# Patient Record
Sex: Male | Born: 1935 | Race: White | Hispanic: No | Marital: Married | Smoking: Former smoker
Health system: Southern US, Community
[De-identification: ages and names within clinical notes are randomized; demographics above are authoritative.]

## PROBLEM LIST (undated history)

## (undated) ENCOUNTER — Emergency Department (HOSPITAL_COMMUNITY): Admission: EM | Payer: Medicare Other | Source: Home / Self Care

## (undated) DIAGNOSIS — I5042 Chronic combined systolic (congestive) and diastolic (congestive) heart failure: Secondary | ICD-10-CM

## (undated) DIAGNOSIS — J90 Pleural effusion, not elsewhere classified: Secondary | ICD-10-CM

## (undated) DIAGNOSIS — I6521 Occlusion and stenosis of right carotid artery: Secondary | ICD-10-CM

## (undated) DIAGNOSIS — I499 Cardiac arrhythmia, unspecified: Secondary | ICD-10-CM

## (undated) DIAGNOSIS — I5043 Acute on chronic combined systolic (congestive) and diastolic (congestive) heart failure: Secondary | ICD-10-CM

## (undated) DIAGNOSIS — Z95810 Presence of automatic (implantable) cardiac defibrillator: Secondary | ICD-10-CM

## (undated) DIAGNOSIS — I2510 Atherosclerotic heart disease of native coronary artery without angina pectoris: Secondary | ICD-10-CM

## (undated) DIAGNOSIS — I472 Ventricular tachycardia: Secondary | ICD-10-CM

## (undated) DIAGNOSIS — I4729 Other ventricular tachycardia: Secondary | ICD-10-CM

## (undated) DIAGNOSIS — Z8669 Personal history of other diseases of the nervous system and sense organs: Secondary | ICD-10-CM

## (undated) DIAGNOSIS — R519 Headache, unspecified: Secondary | ICD-10-CM

## (undated) DIAGNOSIS — I493 Ventricular premature depolarization: Secondary | ICD-10-CM

## (undated) DIAGNOSIS — Z889 Allergy status to unspecified drugs, medicaments and biological substances status: Secondary | ICD-10-CM

## (undated) DIAGNOSIS — I480 Paroxysmal atrial fibrillation: Secondary | ICD-10-CM

## (undated) DIAGNOSIS — R51 Headache: Secondary | ICD-10-CM

## (undated) HISTORY — DX: Personal history of other diseases of the nervous system and sense organs: Z86.69

## (undated) HISTORY — DX: Pleural effusion, not elsewhere classified: J90

## (undated) HISTORY — PX: CARDIAC DEFIBRILLATOR PLACEMENT: SHX171

## (undated) HISTORY — DX: Atherosclerotic heart disease of native coronary artery without angina pectoris: I25.10

## (undated) HISTORY — PX: HEMORRHOID SURGERY: SHX153

## (undated) HISTORY — PX: KNEE ARTHROSCOPY: SUR90

## (undated) HISTORY — DX: Paroxysmal atrial fibrillation: I48.0

## (undated) HISTORY — PX: MITRAL VALVE REPLACEMENT: SHX147

## (undated) HISTORY — PX: CATARACT EXTRACTION W/ INTRAOCULAR LENS  IMPLANT, BILATERAL: SHX1307

## (undated) HISTORY — DX: Presence of automatic (implantable) cardiac defibrillator: Z95.810

## (undated) HISTORY — DX: Ventricular premature depolarization: I49.3

## (undated) HISTORY — DX: Ventricular tachycardia: I47.2

## (undated) HISTORY — DX: Other ventricular tachycardia: I47.29

## (undated) HISTORY — DX: Allergy status to unspecified drugs, medicaments and biological substances: Z88.9

## (undated) HISTORY — PX: HEMORROIDECTOMY: SUR656

---

## 1998-08-19 ENCOUNTER — Ambulatory Visit (HOSPITAL_COMMUNITY): Admission: RE | Admit: 1998-08-19 | Discharge: 1998-08-19 | Payer: Self-pay | Admitting: Endocrinology

## 1998-08-19 ENCOUNTER — Encounter: Payer: Self-pay | Admitting: Endocrinology

## 1998-08-31 ENCOUNTER — Ambulatory Visit (HOSPITAL_COMMUNITY): Admission: RE | Admit: 1998-08-31 | Discharge: 1998-08-31 | Payer: Self-pay | Admitting: Endocrinology

## 1998-11-02 ENCOUNTER — Encounter: Payer: Self-pay | Admitting: *Deleted

## 1998-11-02 ENCOUNTER — Ambulatory Visit (HOSPITAL_COMMUNITY): Admission: RE | Admit: 1998-11-02 | Discharge: 1998-11-02 | Payer: Self-pay | Admitting: *Deleted

## 1999-01-31 HISTORY — PX: CORONARY ARTERY BYPASS GRAFT: SHX141

## 1999-10-25 ENCOUNTER — Ambulatory Visit (HOSPITAL_COMMUNITY): Admission: RE | Admit: 1999-10-25 | Discharge: 1999-10-25 | Payer: Self-pay | Admitting: Cardiology

## 1999-10-25 HISTORY — PX: CARDIAC CATHETERIZATION: SHX172

## 1999-10-31 ENCOUNTER — Ambulatory Visit (HOSPITAL_COMMUNITY): Admission: RE | Admit: 1999-10-31 | Discharge: 1999-10-31 | Payer: Self-pay | Admitting: Cardiology

## 2000-02-07 ENCOUNTER — Encounter: Payer: Self-pay | Admitting: Cardiology

## 2000-02-07 ENCOUNTER — Ambulatory Visit (HOSPITAL_COMMUNITY): Admission: RE | Admit: 2000-02-07 | Discharge: 2000-02-07 | Payer: Self-pay | Admitting: Cardiology

## 2000-02-14 ENCOUNTER — Encounter (HOSPITAL_COMMUNITY): Admission: RE | Admit: 2000-02-14 | Discharge: 2000-05-14 | Payer: Self-pay | Admitting: Cardiology

## 2000-02-27 ENCOUNTER — Encounter: Payer: Self-pay | Admitting: Cardiology

## 2000-02-27 ENCOUNTER — Ambulatory Visit (HOSPITAL_COMMUNITY): Admission: RE | Admit: 2000-02-27 | Discharge: 2000-02-27 | Payer: Self-pay | Admitting: Cardiology

## 2000-02-27 ENCOUNTER — Encounter: Payer: Self-pay | Admitting: Endocrinology

## 2000-03-14 ENCOUNTER — Encounter: Payer: Self-pay | Admitting: Cardiology

## 2000-03-14 ENCOUNTER — Ambulatory Visit (HOSPITAL_COMMUNITY): Admission: RE | Admit: 2000-03-14 | Discharge: 2000-03-14 | Payer: Self-pay | Admitting: Cardiology

## 2000-04-09 ENCOUNTER — Ambulatory Visit (HOSPITAL_COMMUNITY): Admission: RE | Admit: 2000-04-09 | Discharge: 2000-04-09 | Payer: Self-pay | Admitting: Cardiology

## 2000-04-09 ENCOUNTER — Encounter: Payer: Self-pay | Admitting: Endocrinology

## 2000-04-09 ENCOUNTER — Encounter: Payer: Self-pay | Admitting: Cardiology

## 2000-05-29 ENCOUNTER — Ambulatory Visit (HOSPITAL_COMMUNITY): Admission: RE | Admit: 2000-05-29 | Discharge: 2000-05-29 | Payer: Self-pay | Admitting: Cardiology

## 2000-05-29 HISTORY — PX: CARDIAC CATHETERIZATION: SHX172

## 2001-08-02 ENCOUNTER — Emergency Department (HOSPITAL_COMMUNITY): Admission: EM | Admit: 2001-08-02 | Discharge: 2001-08-02 | Payer: Self-pay | Admitting: Emergency Medicine

## 2002-04-08 ENCOUNTER — Inpatient Hospital Stay (HOSPITAL_COMMUNITY): Admission: RE | Admit: 2002-04-08 | Discharge: 2002-04-09 | Payer: Self-pay | Admitting: Internal Medicine

## 2002-04-08 ENCOUNTER — Encounter: Payer: Self-pay | Admitting: Internal Medicine

## 2002-04-09 ENCOUNTER — Encounter: Payer: Self-pay | Admitting: Internal Medicine

## 2003-01-06 ENCOUNTER — Ambulatory Visit (HOSPITAL_COMMUNITY): Admission: RE | Admit: 2003-01-06 | Discharge: 2003-01-06 | Payer: Self-pay | Admitting: Cardiology

## 2003-01-06 HISTORY — PX: CARDIAC CATHETERIZATION: SHX172

## 2003-01-16 ENCOUNTER — Encounter: Admission: RE | Admit: 2003-01-16 | Discharge: 2003-01-16 | Payer: Self-pay | Admitting: Cardiology

## 2004-02-10 ENCOUNTER — Ambulatory Visit: Payer: Self-pay | Admitting: Internal Medicine

## 2004-06-08 ENCOUNTER — Ambulatory Visit: Payer: Self-pay | Admitting: Internal Medicine

## 2004-11-15 ENCOUNTER — Ambulatory Visit: Payer: Self-pay | Admitting: Internal Medicine

## 2004-11-28 ENCOUNTER — Ambulatory Visit: Payer: Self-pay | Admitting: Internal Medicine

## 2004-12-07 ENCOUNTER — Ambulatory Visit: Payer: Self-pay | Admitting: Internal Medicine

## 2004-12-07 ENCOUNTER — Inpatient Hospital Stay (HOSPITAL_COMMUNITY): Admission: AD | Admit: 2004-12-07 | Discharge: 2004-12-09 | Payer: Self-pay | Admitting: Internal Medicine

## 2005-01-04 ENCOUNTER — Ambulatory Visit: Payer: Self-pay | Admitting: Internal Medicine

## 2005-05-09 ENCOUNTER — Ambulatory Visit: Payer: Self-pay | Admitting: Internal Medicine

## 2005-05-16 ENCOUNTER — Ambulatory Visit (HOSPITAL_COMMUNITY): Admission: RE | Admit: 2005-05-16 | Discharge: 2005-05-16 | Payer: Self-pay | Admitting: Orthopedic Surgery

## 2005-08-10 ENCOUNTER — Ambulatory Visit: Payer: Self-pay | Admitting: Internal Medicine

## 2005-11-09 ENCOUNTER — Ambulatory Visit: Payer: Self-pay | Admitting: Internal Medicine

## 2006-01-26 ENCOUNTER — Ambulatory Visit: Payer: Self-pay | Admitting: Internal Medicine

## 2006-02-06 ENCOUNTER — Ambulatory Visit (HOSPITAL_COMMUNITY): Admission: RE | Admit: 2006-02-06 | Discharge: 2006-02-06 | Payer: Self-pay | Admitting: Cardiology

## 2006-02-06 HISTORY — PX: CARDIAC CATHETERIZATION: SHX172

## 2006-04-26 ENCOUNTER — Ambulatory Visit: Payer: Self-pay | Admitting: Internal Medicine

## 2006-06-14 ENCOUNTER — Ambulatory Visit: Payer: Self-pay | Admitting: Internal Medicine

## 2006-07-19 ENCOUNTER — Ambulatory Visit: Payer: Self-pay | Admitting: Internal Medicine

## 2006-08-23 ENCOUNTER — Ambulatory Visit: Payer: Self-pay | Admitting: Internal Medicine

## 2006-09-27 ENCOUNTER — Ambulatory Visit: Payer: Self-pay | Admitting: Internal Medicine

## 2007-01-16 ENCOUNTER — Ambulatory Visit: Payer: Self-pay | Admitting: Internal Medicine

## 2007-03-05 ENCOUNTER — Ambulatory Visit: Payer: Self-pay | Admitting: Vascular Surgery

## 2007-03-18 ENCOUNTER — Ambulatory Visit: Payer: Self-pay | Admitting: Vascular Surgery

## 2007-03-18 ENCOUNTER — Ambulatory Visit (HOSPITAL_COMMUNITY): Admission: RE | Admit: 2007-03-18 | Discharge: 2007-03-18 | Payer: Self-pay | Admitting: Vascular Surgery

## 2007-04-01 ENCOUNTER — Inpatient Hospital Stay (HOSPITAL_COMMUNITY): Admission: EM | Admit: 2007-04-01 | Discharge: 2007-04-04 | Payer: Self-pay | Admitting: Emergency Medicine

## 2007-04-01 ENCOUNTER — Ambulatory Visit: Payer: Self-pay | Admitting: Cardiology

## 2007-04-02 ENCOUNTER — Encounter: Payer: Self-pay | Admitting: Internal Medicine

## 2007-04-02 HISTORY — PX: TRANSTHORACIC ECHOCARDIOGRAM: SHX275

## 2007-04-03 HISTORY — PX: CARDIAC CATHETERIZATION: SHX172

## 2007-04-09 ENCOUNTER — Ambulatory Visit: Payer: Self-pay | Admitting: Vascular Surgery

## 2007-04-24 ENCOUNTER — Ambulatory Visit: Payer: Self-pay | Admitting: Internal Medicine

## 2007-07-18 ENCOUNTER — Ambulatory Visit: Payer: Self-pay | Admitting: Internal Medicine

## 2007-10-02 ENCOUNTER — Ambulatory Visit: Payer: Self-pay | Admitting: Internal Medicine

## 2008-01-03 ENCOUNTER — Encounter: Admission: RE | Admit: 2008-01-03 | Discharge: 2008-01-03 | Payer: Self-pay | Admitting: Orthopedic Surgery

## 2008-02-18 ENCOUNTER — Ambulatory Visit: Payer: Self-pay | Admitting: Internal Medicine

## 2008-03-11 ENCOUNTER — Encounter: Payer: Self-pay | Admitting: Internal Medicine

## 2008-04-23 ENCOUNTER — Encounter: Payer: Self-pay | Admitting: Internal Medicine

## 2008-04-23 ENCOUNTER — Ambulatory Visit: Payer: Self-pay | Admitting: Internal Medicine

## 2008-04-23 DIAGNOSIS — I493 Ventricular premature depolarization: Secondary | ICD-10-CM | POA: Insufficient documentation

## 2008-04-23 DIAGNOSIS — I7300 Raynaud's syndrome without gangrene: Secondary | ICD-10-CM | POA: Insufficient documentation

## 2008-04-23 DIAGNOSIS — I4949 Other premature depolarization: Secondary | ICD-10-CM | POA: Insufficient documentation

## 2008-04-23 DIAGNOSIS — Z95810 Presence of automatic (implantable) cardiac defibrillator: Secondary | ICD-10-CM | POA: Insufficient documentation

## 2008-05-21 ENCOUNTER — Ambulatory Visit: Payer: Self-pay | Admitting: Internal Medicine

## 2008-05-22 ENCOUNTER — Ambulatory Visit: Payer: Self-pay | Admitting: Internal Medicine

## 2008-05-22 ENCOUNTER — Encounter: Payer: Self-pay | Admitting: Internal Medicine

## 2008-06-08 ENCOUNTER — Telehealth (INDEPENDENT_AMBULATORY_CARE_PROVIDER_SITE_OTHER): Payer: Self-pay | Admitting: *Deleted

## 2008-06-12 ENCOUNTER — Telehealth (INDEPENDENT_AMBULATORY_CARE_PROVIDER_SITE_OTHER): Payer: Self-pay | Admitting: *Deleted

## 2008-06-12 ENCOUNTER — Encounter: Payer: Self-pay | Admitting: Internal Medicine

## 2008-06-15 ENCOUNTER — Ambulatory Visit: Payer: Self-pay | Admitting: Internal Medicine

## 2008-06-15 DIAGNOSIS — I4891 Unspecified atrial fibrillation: Secondary | ICD-10-CM | POA: Insufficient documentation

## 2008-06-15 DIAGNOSIS — I4819 Other persistent atrial fibrillation: Secondary | ICD-10-CM | POA: Insufficient documentation

## 2008-06-16 ENCOUNTER — Telehealth: Payer: Self-pay | Admitting: Internal Medicine

## 2008-06-16 LAB — CONVERTED CEMR LAB
BUN: 14 mg/dL (ref 6–23)
Basophils Absolute: 0 10*3/uL (ref 0.0–0.1)
Basophils Relative: 0.7 % (ref 0.0–3.0)
CO2: 28 meq/L (ref 19–32)
Calcium: 9.2 mg/dL (ref 8.4–10.5)
Chloride: 105 meq/L (ref 96–112)
Creatinine, Ser: 0.8 mg/dL (ref 0.4–1.5)
Eosinophils Absolute: 0.3 10*3/uL (ref 0.0–0.7)
Eosinophils Relative: 7.7 % — ABNORMAL HIGH (ref 0.0–5.0)
GFR calc non Af Amer: 100.24 mL/min (ref >60)
Glucose, Bld: 112 mg/dL — ABNORMAL HIGH (ref 70–99)
HCT: 42.2 % (ref 39.0–52.0)
Hemoglobin: 14.6 g/dL (ref 13.0–17.0)
INR: 1 (ref 0.8–1.0)
Lymphocytes Relative: 15.8 % (ref 12.0–46.0)
Lymphs Abs: 0.6 10*3/uL — ABNORMAL LOW (ref 0.7–4.0)
MCHC: 34.5 g/dL (ref 30.0–36.0)
MCV: 99.7 fL (ref 78.0–100.0)
Magnesium: 2.4 mg/dL (ref 1.5–2.5)
Monocytes Absolute: 0.2 10*3/uL (ref 0.1–1.0)
Monocytes Relative: 6.9 % (ref 3.0–12.0)
Neutro Abs: 2.4 10*3/uL (ref 1.4–7.7)
Neutrophils Relative %: 68.9 % (ref 43.0–77.0)
Platelets: 131 10*3/uL — ABNORMAL LOW (ref 150.0–400.0)
Potassium: 4.8 meq/L (ref 3.5–5.1)
Prothrombin Time: 11.1 s (ref 10.9–13.3)
RBC: 4.23 M/uL (ref 4.22–5.81)
RDW: 12.5 % (ref 11.5–14.6)
Sodium: 139 meq/L (ref 135–145)
WBC: 3.5 10*3/uL — ABNORMAL LOW (ref 4.5–10.5)
aPTT: 29.9 s — ABNORMAL HIGH (ref 21.7–28.8)

## 2008-06-17 ENCOUNTER — Ambulatory Visit: Payer: Self-pay | Admitting: Internal Medicine

## 2008-06-17 ENCOUNTER — Inpatient Hospital Stay (HOSPITAL_COMMUNITY): Admission: RE | Admit: 2008-06-17 | Discharge: 2008-06-20 | Payer: Self-pay | Admitting: Internal Medicine

## 2008-06-18 ENCOUNTER — Encounter: Payer: Self-pay | Admitting: Internal Medicine

## 2008-06-22 ENCOUNTER — Telehealth: Payer: Self-pay | Admitting: Internal Medicine

## 2008-06-22 ENCOUNTER — Encounter: Payer: Self-pay | Admitting: Internal Medicine

## 2008-06-26 ENCOUNTER — Telehealth: Payer: Self-pay | Admitting: Internal Medicine

## 2008-07-02 ENCOUNTER — Telehealth (INDEPENDENT_AMBULATORY_CARE_PROVIDER_SITE_OTHER): Payer: Self-pay | Admitting: *Deleted

## 2008-07-02 ENCOUNTER — Encounter: Payer: Self-pay | Admitting: Internal Medicine

## 2008-07-02 ENCOUNTER — Ambulatory Visit: Payer: Self-pay

## 2008-09-29 ENCOUNTER — Ambulatory Visit: Payer: Self-pay | Admitting: Internal Medicine

## 2008-10-23 ENCOUNTER — Telehealth (INDEPENDENT_AMBULATORY_CARE_PROVIDER_SITE_OTHER): Payer: Self-pay | Admitting: *Deleted

## 2008-12-21 ENCOUNTER — Ambulatory Visit: Payer: Self-pay | Admitting: Internal Medicine

## 2008-12-25 ENCOUNTER — Telehealth: Payer: Self-pay | Admitting: Internal Medicine

## 2009-01-11 ENCOUNTER — Encounter: Payer: Self-pay | Admitting: Internal Medicine

## 2009-01-12 ENCOUNTER — Ambulatory Visit: Payer: Self-pay | Admitting: Internal Medicine

## 2009-01-12 DIAGNOSIS — R233 Spontaneous ecchymoses: Secondary | ICD-10-CM | POA: Insufficient documentation

## 2009-01-13 LAB — CONVERTED CEMR LAB
Basophils Absolute: 0 10*3/uL (ref 0.0–0.1)
Basophils Relative: 0.8 % (ref 0.0–3.0)
Eosinophils Absolute: 0.2 10*3/uL (ref 0.0–0.7)
Eosinophils Relative: 4.1 % (ref 0.0–5.0)
HCT: 41 % (ref 39.0–52.0)
Hemoglobin: 14.3 g/dL (ref 13.0–17.0)
Lymphocytes Relative: 19.3 % (ref 12.0–46.0)
Lymphs Abs: 0.9 10*3/uL (ref 0.7–4.0)
MCHC: 34.8 g/dL (ref 30.0–36.0)
MCV: 100.9 fL — ABNORMAL HIGH (ref 78.0–100.0)
Monocytes Absolute: 0.5 10*3/uL (ref 0.1–1.0)
Monocytes Relative: 11.8 % (ref 3.0–12.0)
Neutro Abs: 3 10*3/uL (ref 1.4–7.7)
Neutrophils Relative %: 64 % (ref 43.0–77.0)
Platelets: 117 10*3/uL — ABNORMAL LOW (ref 150.0–400.0)
RBC: 4.06 M/uL — ABNORMAL LOW (ref 4.22–5.81)
RDW: 12.7 % (ref 11.5–14.6)
WBC: 4.6 10*3/uL (ref 4.5–10.5)

## 2009-01-15 ENCOUNTER — Telehealth: Payer: Self-pay | Admitting: Internal Medicine

## 2009-01-28 ENCOUNTER — Encounter: Payer: Self-pay | Admitting: Internal Medicine

## 2009-02-05 ENCOUNTER — Telehealth (INDEPENDENT_AMBULATORY_CARE_PROVIDER_SITE_OTHER): Payer: Self-pay | Admitting: *Deleted

## 2009-02-23 ENCOUNTER — Encounter: Payer: Self-pay | Admitting: Internal Medicine

## 2009-03-03 ENCOUNTER — Encounter: Payer: Self-pay | Admitting: Internal Medicine

## 2009-03-10 ENCOUNTER — Telehealth (INDEPENDENT_AMBULATORY_CARE_PROVIDER_SITE_OTHER): Payer: Self-pay | Admitting: *Deleted

## 2009-03-18 ENCOUNTER — Encounter: Payer: Self-pay | Admitting: Internal Medicine

## 2009-03-24 ENCOUNTER — Encounter: Payer: Self-pay | Admitting: Internal Medicine

## 2009-04-03 ENCOUNTER — Encounter: Payer: Self-pay | Admitting: Internal Medicine

## 2009-04-05 ENCOUNTER — Ambulatory Visit: Payer: Self-pay | Admitting: Internal Medicine

## 2009-04-13 ENCOUNTER — Encounter: Payer: Self-pay | Admitting: Internal Medicine

## 2009-06-25 ENCOUNTER — Telehealth: Payer: Self-pay | Admitting: Internal Medicine

## 2009-07-05 ENCOUNTER — Ambulatory Visit: Payer: Self-pay | Admitting: Internal Medicine

## 2009-07-19 ENCOUNTER — Ambulatory Visit: Payer: Self-pay | Admitting: Internal Medicine

## 2009-07-19 ENCOUNTER — Telehealth: Payer: Self-pay | Admitting: Internal Medicine

## 2009-07-22 ENCOUNTER — Telehealth: Payer: Self-pay | Admitting: Internal Medicine

## 2009-09-20 ENCOUNTER — Telehealth (INDEPENDENT_AMBULATORY_CARE_PROVIDER_SITE_OTHER): Payer: Self-pay | Admitting: *Deleted

## 2009-09-28 ENCOUNTER — Ambulatory Visit: Payer: Self-pay | Admitting: Internal Medicine

## 2009-12-13 ENCOUNTER — Telehealth (INDEPENDENT_AMBULATORY_CARE_PROVIDER_SITE_OTHER): Payer: Self-pay | Admitting: *Deleted

## 2009-12-28 ENCOUNTER — Ambulatory Visit: Payer: Self-pay | Admitting: Internal Medicine

## 2010-02-27 LAB — CONVERTED CEMR LAB
BUN: 15 mg/dL (ref 6–23)
BUN: 16 mg/dL (ref 6–23)
CO2: 30 meq/L (ref 19–32)
CO2: 31 meq/L (ref 19–32)
Calcium: 9.2 mg/dL (ref 8.4–10.5)
Calcium: 9.3 mg/dL (ref 8.4–10.5)
Chloride: 101 meq/L (ref 96–112)
Chloride: 105 meq/L (ref 96–112)
Creatinine, Ser: 0.9 mg/dL (ref 0.4–1.5)
Creatinine, Ser: 1.1 mg/dL (ref 0.4–1.5)
GFR calc non Af Amer: 69.13 mL/min (ref >60)
GFR calc non Af Amer: 85.06 mL/min (ref >60)
Glucose, Bld: 101 mg/dL — ABNORMAL HIGH (ref 70–99)
Glucose, Bld: 158 mg/dL — ABNORMAL HIGH (ref 70–99)
Magnesium: 2.3 mg/dL (ref 1.5–2.5)
Magnesium: 2.3 mg/dL (ref 1.5–2.5)
Potassium: 4.5 meq/L (ref 3.5–5.1)
Potassium: 4.6 meq/L (ref 3.5–5.1)
Sodium: 139 meq/L (ref 135–145)
Sodium: 140 meq/L (ref 135–145)

## 2010-03-03 NOTE — Cardiovascular Report (Signed)
Summary: Office Visit   Office Visit   Imported By: Roderic Ovens 01/03/2010 16:17:08  _____________________________________________________________________  External Attachment:    Type:   Image     Comment:   External Document

## 2010-03-03 NOTE — Progress Notes (Signed)
Summary: ICD shocks  Phone Note Call from Patient Call back at Home Phone [phone]   Caller: Patient [phone] Reason for Call: Talk to Nurse Summary of Call: Spoke with patient.  Recieved 4 shocks Friday night for VT/VF, pt only aware of 1 shock.  No new symptoms of cp or SOB, no antecedent symtpoms to shock.  Appt today at 1:45 with Dr Graciela Husbands.  Pt advised not to drive. Gypsy Balsam RN BSN  July 19, 2009 9:52 AM

## 2010-03-03 NOTE — Progress Notes (Signed)
  Phone Note Other Incoming   Caller: [facility] Summary of Call: S/W lady from [facility] who says they have not gotten a refill authorization for this pt's [name]. I see where it has been sent 2 times already. I s/w her because this is happening more and more lately that they are claiming NOT to receive our faxes. I went over the fax numbers that we have in our system for them and she confirmed it as being their main number. I told her that we are not having this problem with any other pharmacies. She was appreciative for the information and will s/w her supervisor. RX given to pharmacist.  Initial call taken by: Duncan Dull, RN, BSN,  February 05, 2009 10:32 AM Call placed by: Duncan Dull, RN, BSN,  February 05, 2009 10:24 AM

## 2010-03-03 NOTE — Progress Notes (Signed)
  Phone Note Outgoing Call   Call placed by: [name] CMA,  December 13, 2009 2:20 PM Call placed to: Patient Action Taken: Phone Call Completed Summary of Call: Pt called today to ask that we reorder his [name] for pickup when he comes in for appt next week.  I placed a call to [employer] today at 2:20 and ordered [name] .5mg  two times a day for a 3 month supply to be delivered to our office.  ORDER NUMBER#  [id number].  Expecting in 7-10 business days and will hold up front for upcoming appt.   Initial call taken by: [name] CMA,  December 13, 2009 2:22 PM     Appended Document:  pt made aware tikosyn here at the front desk for pick up

## 2010-03-03 NOTE — Letter (Signed)
Summary: Confirmation of Meds Shipped  Confirmation of Meds Shipped   Imported By: Marylou Mccoy 05/14/2009 14:44:37  _____________________________________________________________________  External Attachment:    Type:   Image     Comment:   External Document

## 2010-03-03 NOTE — Letter (Signed)
Summary: [facility]   Imported By: Kassie Mends 04/12/2009 13:09:49  _____________________________________________________________________  External Attachment:    Type:   Image     Comment:   External Document

## 2010-03-03 NOTE — Progress Notes (Signed)
Summary: [facility] Papers for [name]  Phone Note Outgoing Call   Call placed by: Duncan Dull, RN, BSN,  March 10, 2009 9:08 AM Call placed to: Patient Summary of Call: S/W [name] to let him know that we have filled out his [name] papers for [employer] and I am placing them in the mail today. Initial call taken by: Duncan Dull, RN, BSN,  March 10, 2009 9:09 AM

## 2010-03-03 NOTE — Progress Notes (Signed)
Summary: refill/ need medication ordered  Phone Note Refill Request Message from:  Patient on September 20, 2009 11:33 AM  Refills Requested: Medication #1:  [name] 500 MCG CAPS one by mouth two times a day Send to [facility] [phone]  Initial call taken by: Judie Grieve,  September 20, 2009 11:34 AM  Follow-up for Phone Call        Pt ordered placed for [name].  [id number]//order number// Due to be shipped in 7-10 days.   Follow-up by: [name] CMA,  September 23, 2009 4:52 PM

## 2010-03-03 NOTE — Progress Notes (Signed)
Summary: Heart Medication History   Heart Medication History   Imported By: Roderic Ovens 10/28/2009 11:17:39  _____________________________________________________________________  External Attachment:    Type:   Image     Comment:   External Document

## 2010-03-03 NOTE — Cardiovascular Report (Signed)
Summary: Office Visit Remote   Office Visit Remote   Imported By: Roderic Ovens 03/08/2009 16:39:47  _____________________________________________________________________  External Attachment:    Type:   Image     Comment:   External Document

## 2010-03-03 NOTE — Letter (Signed)
Summary: Remote Device Check  [facility] [employer], Main Office  [address]   [place], [place] [address]   Phone: [phone]  Fax: [phone]     April 13, 2009 MRN: [id number]   [facility] [address] [place], [place]  [address]   Dear Mr. [facility],   Your remote transmission was recieved and reviewed by your physician.  All diagnostics were within normal limits for you.  ___X__Your next transmission is scheduled for:     July 05, 2009.  Please transmit at any time this day.  If you have a wireless device your transmission will be sent automatically.     Sincerely,  Proofreader

## 2010-03-03 NOTE — Assessment & Plan Note (Signed)
Summary: defib check.sjm.amber 4 shocks fri night   History of Present Illness: [name] is seen in followup for polymorphic ventricular tachycardia for which he has a ICD.  He has hx of CABG, PFO closure and mitral valve repair at [facility].  He has had appropriate intercurrent therapy of VTPM;  This was in March 2009. At that time he underwent catheterization demonstrating moderate native disease with a reasonable revascularrization.  he has done well intercurrent lady from an arrhythmia point of view apart from the development of atrial fibrillation until Friday. He was standing in the kitchen putting stakes into a bag when he had the premonition of syncope. He then picked himself up off the floor and when outside. Interrogation of his device demonstrated recurrent shocks for a slow polymorphic ventricular tachycardia with a cycle length very between 363-240 ms. Final termination was quite dirty.  He denies intercurrent change in his functional status. The patient denies SOB, chest pain, edema or palpitations      Current Medications (verified): 1)  Actos 45 Mg Tabs (Pioglitazone Hcl) .Marland Kitchen.. 1 By Mouth Daily 2)  Allegra 60 Mg Tabs (Fexofenadine Hcl) .... Once Daily 3)  Lipitor 80 Mg Tabs (Atorvastatin Calcium) .Marland Kitchen.. 1 Tab Once Daily 4)  Aspirin 81 Mg Tbec (Aspirin) .... Take One Tablet By Mouth Daily 5)  Multivitamins   Tabs (Multiple Vitamin) .... Once Daily 6)  Starlix 120 Mg Tabs (Nateglinide) .Marland Kitchen.. 1 Tab Two Times A Day 7)  Carvedilol 3.125 Mg Tabs (Carvedilol) .... Take One Tablet By Mouth Twice A Day 8)  [name] 500 Mcg Caps (Dofetilide) .... One By Mouth Two Times A Day 9)  Januvia 100 Mg Tabs (Sitagliptin Phosphate) .... One By Mouth Daily 10)  Magnesium Oxide 250 Mg Tabs (Magnesium Oxide) .... Take 1 Tablet By Mouth Once A Day 11)  Ocuvite-Lutein  Caps (Multiple Vitamins-Minerals) .... Take 1 Capsule By Mouth Once A Day  Allergies (verified): 1)  ! Coumadin  Vital  Signs:  Patient profile:   77 year old male Height:      74 inches Weight:      221 pounds BMI:     28.48 Pulse rate:   81 / minute Pulse rhythm:   irregular BP sitting:   98 / 62  (left arm)  Vitals Entered By: Gypsy Balsam RN BSN (July 19, 2009 2:09 PM)  Physical Exam  General:  The patient was alert and oriented in no acute distress. HEENT Normal.  Neck veins were flat, carotids were brisk.  Lungs were clear.  Heart sounds were Irregular without murmurs or gallops Abdomen was soft with active bowel sounds. There is no clubbing cyanosis or edema. Skin Warm and dry     ICD Specifications Following MD:  Sherryl Manges, MD     Referring MD:  [facility] ICD Vendor:  [employer] Jude     ICD Model Number:  [id number]     ICD Serial Number:  [id number] ICD DOI:  06/17/2008     ICD Implanting MD:  Sherryl Manges, MD  Lead 1:    Location: RA     DOI: 04/08/2002     Model #: [id number]     Serial #: [id number]     Status: active Lead 2:    Location: RV     DOI: 04/08/2002     Model #: [id number]     Serial #: [id number]     Status: active  Indications::  POLYMORPHIC VT, SYNCOPE  Explantation Comments: 06/17/2008 Prizm  [id number] explanted  ICD Follow Up Remote Check?  No Battery Voltage:  3.13 V     Charge Time:  10.5 seconds     Battery Est. Longevity:  5.1 YEARS Underlying rhythm:  SR WITH BIGEMINAL PVC'S ICD Dependent:  No       ICD Device Measurements Atrium:  Amplitude: 5.0 mV, Impedance: 430 ohms, Threshold: 0.75 V at 0.5 msec Right Ventricle:  Amplitude: 11.8 mV, Impedance: 510 ohms, Threshold: 0.5 V at 0.5 msec Shock Impedance: 44 ohms   Episodes MS Episodes:  123     Percent Mode Switch:  2.3%     Coumadin:  No Shock:  4     ATP:  0     Nonsustained:  0     Atrial Pacing:  76%     Ventricular Pacing:  34%  Brady Parameters Mode DDDR     Lower Rate Limit:  65     Upper Rate Limit 105 PAV 225     Sensed AV Delay:  200  Tachy Zones VF:  240     VT:  214     VT1:  171     Tech Comments:  Pt seen  as add on today for shocks and syncope Friday night.  Pt was putting away leftover steaks, got shocked, and passed out.  Pt recieved 4 total shocks for VT that degenerated into VF.  Total episode duration, a little over 2 minutes.  Pt has not had any illness recently, no med changes, no exertion day of shocks.  Plan per SK. Gypsy Balsam RN BSN  July 19, 2009 2:12 PM   Dr Graciela Husbands reprogrammed device from VT monitor zone at 160 to VT zone with therapy at 171. 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 This is the first episode since 2009. We discussed treatment options including changing his antiarrhythmic therapy to amiodarone as well as catheter ablation versus doing nothing with his [name]. We will plan to do the latter. I will change his carvedilol to metoprolol succinate as it has  t more beta blockade for its blood pressure lowering effects which are limiting up titration of his carvedilol.  We will check his potassium and magnesium today.  I will plan to send his strips to  Dr. Dory Peru at the [facility] clinic for her review  he is advised not to drive His updated medication list for this problem includes:    Aspirin 81 Mg Tbec (Aspirin) .Marland Kitchen... Take one tablet by mouth daily    Metoprolol Succinate 25 Mg Xr24h-tab (Metoprolol succinate) .Marland Kitchen... Take one tablet by mouth daily    [name] 500 Mcg Caps (Dofetilide) ..... One by mouth two times a day  Orders: TLB-BMP (Basic Metabolic Panel-BMET) (80048-METABOL) TLB-Magnesium (Mg) (83735-MG)  Problem # 2:  ATRIAL FIBRILLATION (ICD-427.31) we will need to deal with the issue of oral anticoagulation; however, until we can control his ventricular arrhythmias adding it at this point I think is too great a risk His updated medication list for  this problem includes:    Aspirin 81 Mg Tbec (Aspirin) .Marland Kitchen... Take one tablet by mouth daily    Metoprolol Succinate 25 Mg Xr24h-tab (Metoprolol succinate) .Marland Kitchen... Take one tablet by mouth daily    [name] 500 Mcg Caps (Dofetilide) ..... 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 In the absence of symptoms suggestive of progressive angina I don't think it is essentially worth  with an isolated episode of VT, repeating his catheterization His updated medication list for this problem includes:    Aspirin 81 Mg Tbec (Aspirin) .Marland Kitchen... Take one tablet by mouth daily    Metoprolol Succinate 25 Mg Xr24h-tab (Metoprolol succinate) .Marland Kitchen... Take one tablet by mouth daily    [name] 500 Mcg Caps (Dofetilide) ..... One by mouth two times a day  Patient Instructions: 1)  Your physician recommends that you schedule a follow-up appointment in: 2months with Dr Graciela Husbands. 2)  Your physician recommends that you return for lab work today. 3)  Your physician has recommended you make the following change in your medication: Stop Carvedilol, Start Toprol XL 25mg  1 tablet daily. Prescriptions: METOPROLOL SUCCINATE 25 MG XR24H-TAB (METOPROLOL SUCCINATE) Take one tablet by mouth daily  #30 x 11   Entered by:   Optometrist BSN   Authorized by:   Nathen May, MD, [facility]   Signed by:   Gypsy Balsam RN BSN on 07/19/2009   Method used:   [facility] to        [facility]       [address] [place], [place]  [address]       Ph: [phone] or [phone]       Fax: [phone]   RxID:   [id number]

## 2010-03-03 NOTE — Assessment & Plan Note (Signed)
Summary: defib check.sjm.amber   History of Present Illness: [name] is seen in followup for polymorphic ventricular tachycardia for which he has a ICD.  He has hx of CABG, PFO closure and mitral valve repair at [facility].  He has had appropriate intercurrent therapy of VTPM;  This was in March 2009. At that time he underwent catheterization demonstrating moderate native disease with a reasonable revascularrization.  he has done well intercurrently from an arrhythmia point of view apart from the development of atrial fibrillation  in June, he had an episode of recurrent ventricular tachycardia with a slow polymorphic VT. This was appropriately treated. It was associated with syncope.  At that time we switched his carvedilol to metoprolol succinate with the hope that they greater dose of beta blocker would help suppress ventricular ectopy as a surrogate for ventricular tachycardia. He felt no improvement and he went back on carvedilol.   He denies intercurrent change in his functional status. The patient denies SOB, chest pain, edema or palpitations      Current Medications (verified): 1)  Actos 45 Mg Tabs (Pioglitazone Hcl) .Marland Kitchen.. 1 By Mouth Daily 2)  Allegra 60 Mg Tabs (Fexofenadine Hcl) .... Once Daily 3)  Lipitor 80 Mg Tabs (Atorvastatin Calcium) .Marland Kitchen.. 1 Tab Once Daily 4)  Aspirin 81 Mg Tbec (Aspirin) .... Take One Tablet By Mouth Daily 5)  Multivitamins   Tabs (Multiple Vitamin) .... Once Daily 6)  Starlix 120 Mg Tabs (Nateglinide) .Marland Kitchen.. 1 Tab Two Times A Day 7)  [name] 500 Mcg Caps (Dofetilide) .... One By Mouth Two Times A Day 8)  Januvia 100 Mg Tabs (Sitagliptin Phosphate) .... One By Mouth Daily 9)  Magnesium Oxide 250 Mg Tabs (Magnesium Oxide) .... Take 1 Tablet By Mouth Once A Day 10)  Ocuvite-Lutein  Caps (Multiple Vitamins-Minerals) .... Take 1 Capsule By Mouth Once A Day 11)  Coreg 3.125 Mg Tabs (Carvedilol) .... Take One Tablet Two Times A Day  Allergies  (verified): 1)  ! 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 Coronary artery bypass grafting.   2. AICD placement.   3. Hemorrhoidectomy.   4. Right arthroscopic knee repair.   Family History: Last updated: 05/21/08  Father died of emphysema at age 52.  Mother lived up  into her 35s and had history of stroke and arthritis.  Social History: Last updated: 05/21/08 Married  Alcohol Use - yes Drug Use - no Tobacco Use - No.   Vital Signs:  Patient profile:   77 year old male Height:      74 inches Weight:      219 pounds BMI:     28.22 Pulse rhythm:   regular BP sitting:   112 / 74  (right arm) Cuff size:   regular  Vitals Entered By: [name] CMA (September 28, 2009 11:42 AM)  Physical Exam  General:  The patient was alert and oriented in no acute distress. HEENT Normal.  Neck veins were flat, carotids were brisk.  Lungs were clear.  Heart sounds were regular without murmurs or gallops.  Abdomen was soft with active bowel sounds. There is no clubbing cyanosis or edema. Skin Warm and dry    EKG  Procedure date:  09/28/2009  Findings:  sinus rhythm at 66 intervals 0.25/.one one 6.46 Axis is -60 There is intermittent atrial pacing Frequent ventricular ectopy with a left bundle inferior axis morphology   ICD Specifications Following MD:  Sherryl Manges, MD     Referring MD:  [facility] ICD Vendor:  St Jude     ICD Model Number:  [id number]     ICD Serial Number:  [id number] ICD DOI:  06/17/2008     ICD Implanting MD:  Sherryl Manges, MD  Lead 1:    Location: RA     DOI: 04/08/2002     Model #: 6295     Serial #: [id number]     Status: active Lead 2:    Location: RV      DOI: 04/08/2002     Model #: 1027     Serial #: [id number]     Status: active  Indications::  POLYMORPHIC VT, SYNCOPE  Explantation Comments: 06/17/2008 Prizm [id number] explanted  ICD Follow Up Battery Voltage:  3.19 V     Charge Time:  10.5 seconds     Battery Est. Longevity:  6 yrs Underlying rhythm:  SR ICD Dependent:  No       ICD Device Measurements Atrium:  Amplitude: 5.0 mV, Impedance: 460 ohms, Threshold: 0.75 V at 0.5 msec Right Ventricle:  Amplitude: 10.0 mV, Impedance: 560 ohms, Threshold: 0.5 V at 0.5 msec Shock Impedance: 45 ohms   Episodes MS Episodes:  423     Percent Mode Switch:  1.7%     Coumadin:  No Atrial Pacing:  80%     Ventricular Pacing:  40%  Brady Parameters Mode DDDR     Lower Rate Limit:  65     Upper Rate Limit 105 PAV 225     Sensed AV Delay:  200  Tachy Zones VF:  240     VT:  214     VT1:  171     Next Remote Date:  12/30/2009     Next Cardiology Appt Due:  08/31/2010 Tech Comments:  1 ABORTED SHOCK ON 09-18-09 LASTING 3 SECONDS.  NORMAL DEVICE FUNCTION.  CHANGED REACTION TIME FROM FAST TO MEDIUM.  MERLIN CHECK 12-30-09. ROV IN 12 MTHS W/SK. 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 This is on the carvedilol. We will plan to increase his beta blocker by switching back to metoprolol succinate at 50 mg a day.  I should note that his last visit potassium magnesium levels were normal The following medications were removed from the medication list:    Metoprolol Succinate 25 Mg Xr24h-tab (Metoprolol succinate) .Marland Kitchen... Take one tablet by mouth daily His updated medication list for this problem includes:    Aspirin 81 Mg Tbec (Aspirin) .Marland Kitchen... Take one tablet by mouth daily    [name] 500 Mcg Caps (Dofetilide) ..... One by mouth two times  a day    Metoprolol Succinate 50 Mg Xr24h-tab (Metoprolol succinate) ..... One tablet daily  Orders: EKG w/ Interpretation (93000)  The following medications were removed from the medication list:    Metoprolol Succinate 25 Mg Xr24h-tab (Metoprolol succinate) .Marland Kitchen... Take one tablet by mouth daily His updated medication list for this problem includes:    Aspirin 81 Mg Tbec (Aspirin) .Marland Kitchen... Take one tablet by mouth daily    [name] 500 Mcg  Caps (Dofetilide) ..... One by mouth two times a day    Coreg 3.125 Mg Tabs (Carvedilol) .Marland Kitchen... Take one tablet two times a day  Problem # 2:  ATRIAL FIBRILLATION (ICD-427.31)  there've been some intercurrent episodes of atrial tachycardia; no sustained atrial fibrillation The following medications were removed from the medication list:    Metoprolol Succinate 25 Mg Xr24h-tab (Metoprolol succinate) .Marland Kitchen... Take one tablet by mouth daily His updated medication list for this problem includes:    Aspirin 81 Mg Tbec (Aspirin) .Marland Kitchen... Take one tablet by mouth daily    [name] 500 Mcg Caps (Dofetilide) ..... One by mouth two times a day    Metoprolol Succinate 50 Mg Xr24h-tab (Metoprolol succinate) ..... One tablet daily  Orders: EKG w/ Interpretation (93000)  The following medications were removed from the medication list:    Metoprolol Succinate 25 Mg Xr24h-tab (Metoprolol succinate) .Marland Kitchen... Take one tablet by mouth daily His updated medication list for this problem includes:    Aspirin 81 Mg Tbec (Aspirin) .Marland Kitchen... Take one tablet by mouth daily    [name] 500 Mcg Caps (Dofetilide) ..... One by mouth two times a day    Coreg 3.125 Mg Tabs (Carvedilol) .Marland Kitchen... Take one tablet two times a day  Problem # 3:  CARDIOMYOPATHY, ISCHEMIC S/P CABG (ICD-414.8)  stable post CABG.  He is racy issue about Actos and its relation to heart disease. He has stopped taking his Actos on his own. Orders: EKG w/ Interpretation (93000)  The following medications were removed from the  medication list:    Metoprolol Succinate 25 Mg Xr24h-tab (Metoprolol succinate) .Marland Kitchen... Take one tablet by mouth daily His updated medication list for this problem includes:    Aspirin 81 Mg Tbec (Aspirin) .Marland Kitchen... Take one tablet by mouth daily    [name] 500 Mcg Caps (Dofetilide) ..... One by mouth two times a day    Metoprolol Succinate 50 Mg Xr24h-tab (Metoprolol succinate) ..... One tablet daily  Problem # 4:  IMPLANTABLE DEFIBRILLATOR ST JUDE DDD (ICD-V45.02) Assessment: New Device parameters and data were reviewed and no changes were made   Orders: EKG w/ Interpretation (93000)  Problem # 5:  PREMATURE VENTRICULAR CONTRACTIONS (ICD-427.69)  a question as to what degree R. the ventricular ectopics contributing to his current biopsy. This is an address in the past and there've been multiple morphologies. It may well be worth trying amiodarone for suppression. His appearance belies his age. The following medications were removed from the medication list:    Metoprolol Succinate 25 Mg Xr24h-tab (Metoprolol succinate) .Marland Kitchen... Take one tablet by mouth daily His updated medication list for this problem includes:    Aspirin 81 Mg Tbec (Aspirin) .Marland Kitchen... Take one tablet by mouth daily    [name] 500 Mcg Caps (Dofetilide) ..... One by mouth two times a day    Metoprolol Succinate 50 Mg Xr24h-tab (Metoprolol succinate) ..... One tablet daily  Orders: EKG w/ Interpretation (93000)  The following medications were removed from the medication list:    Metoprolol Succinate 25 Mg Xr24h-tab (Metoprolol succinate) .Marland Kitchen... Take one tablet by mouth daily His updated medication list for this problem includes:    Aspirin 81 Mg Tbec (Aspirin) .Marland Kitchen... Take one tablet by mouth daily    [name] 500 Mcg Caps (Dofetilide) ..... One by mouth two times a day    Metoprolol Succinate 50 Mg Xr24h-tab (Metoprolol succinate) ..... One tablet daily  Patient Instructions: 1)  Your physician has recommended you make the  following change in your  medication: STOP COREG, START METOPROLOL 50MG  ONCE A DAY.  2)  Your physician recommends that you schedule a follow-up appointment in: 3 MONTHS Prescriptions: METOPROLOL SUCCINATE 50 MG XR24H-TAB (METOPROLOL SUCCINATE) ONE TABLET DAILY  #90 x 3   Entered by:   Claris Gladden RN   Authorized by:   Nathen May, MD, [facility]   Signed by:   Claris Gladden RN on 09/28/2009   Method used:   Faxed to ...       [facility] (mail-order)             , [place]         Ph: [phone]       Fax: [phone]   RxID:   [id number]

## 2010-03-03 NOTE — Letter (Signed)
Summary: [facility] - Connection to [facility] [facility] - Connection to Care   Imported By: Marylou Mccoy 10/19/2009 13:15:00  _____________________________________________________________________  External Attachment:    Type:   Image     Comment:   External Document

## 2010-03-03 NOTE — Cardiovascular Report (Signed)
Summary: Office Visit Remote   Office Visit Remote   Imported By: Roderic Ovens 08/16/2009 12:35:00  _____________________________________________________________________  External Attachment:    Type:   Image     Comment:   External Document

## 2010-03-03 NOTE — Progress Notes (Signed)
Summary: Pt request call for test results  Phone Note Call from Patient Call back at Home Phone [phone]   Caller: Patient Summary of Call: Pt request call for test results Initial call taken by: Judie Grieve,  July 22, 2009 11:34 AM  Follow-up for Phone Call        Pt. given results of BMP and Magnesium drawn 07/19/09 Follow-up by: Dossie Arbour, RN, BSN,  July 22, 2009 11:53 AM

## 2010-03-03 NOTE — Letter (Signed)
Summary: Remote Device Check  [employer], Main Office  [address]   [place], [place] [address]   Phone: [phone]  Fax: [phone]     March 03, 2009 MRN: [id number]   [facility] [address] , [place]  [address]   Dear Mr. [facility],   Your remote transmission was recieved and reviewed by your physician.  All diagnostics were within normal limits for you.  __X___Your next transmission is scheduled for:    April 12, 2009.  Please transmit at any time this day.  If you have a wireless device your transmission will be sent automatically.      Sincerely,  Proofreader

## 2010-03-03 NOTE — Cardiovascular Report (Signed)
Summary: Office Visit Remote   Office Visit Remote   Imported By: Roderic Ovens 04/14/2009 10:56:57  _____________________________________________________________________  External Attachment:    Type:   Image     Comment:   External Document

## 2010-03-03 NOTE — Progress Notes (Signed)
Summary: refill**New Pharmacy**  Phone Note Refill Request Message from:  Patient on Jun 25, 2009 9:38 AM  Refills Requested: Medication #1:  [name] 500 MCG CAPS one by mouth two times a day   Supply Requested: 3 months [employer]izer [phone] PT ID # [id number]   Method Requested: Telephone to Pharmacy Initial call taken by: Migdalia Dk,  Jun 25, 2009 9:39 AM  Follow-up for Phone Call        Called and placed order for pts [name].  Rx will be mailed to our facility and we will contact patient when it arrives.  Order # [id number].  [name], CMA/AAMA Follow-up by: [name] CMA,  Jun 29, 2009 11:53 AM     Appended Document: refill**New Pharmacy** pt [name] arrived today.  Called pt to inform.  LMOM and medication is filed up at the front desk.  AT, [facility] 07/01/2009

## 2010-03-03 NOTE — Assessment & Plan Note (Signed)
Summary: 3 MONTH/D.MILLER   Visit Type:  3 month follow up  CC:  no complaints.  History of Present Illness: [name] is seen in followup for polymorphic ventricular tachycardia for which he has a ICD.  He has hx of CABG, PFO closure and mitral valve repair at [facility].  He has had appropriate intercurrent therapy of VTPM;  This was in March 2009. At that time he underwent catheterization demonstrating moderate native disease with a reasonable revascularrization.  he has done well intercurrently from an arrhythmia point of view apart from the development of atrial fibrillation  in June, he had an episode of recurrent ventricular tachycardia with a slow polymorphic VT. This was appropriately treated. It was associated with syncope.  At that time we switched his carvedilol to metoprolol succinate with the hope that they greater dose of beta blocker would help suppress ventricular ectopy as a surrogate for ventricular tachycardia. He felt no improvement and he went back on carvedilol.   He denies intercurrent change in his functional status. The patient denies SOB, chest pain, edema or palpitations      Problems Prior to Update: 1)  Petechiae  (ICD-782.7) 2)  Mitral Valve S/p Repair  (ICD-424.0) 3)  Atrial Fibrillation  (ICD-427.31) 4)  Implantable Defibrillator St Jude Ddd  (ICD-V45.02) 5)  Raynaud's Disease  (ICD-443.0) 6)  Cardiomyopathy, Ischemic S/p Cabg  (ICD-414.8) 7)  Premature Ventricular Contractions  (ICD-427.69) 8)  Ventricular Tachycardia-polymorphic  (ICD-427.1)  Current Medications (verified): 1)  Actos 45 Mg Tabs (Pioglitazone Hcl) .Marland Kitchen.. 1 By Mouth Daily 2)  Lipitor 80 Mg Tabs (Atorvastatin Calcium) .Marland Kitchen.. 1 Tab Once Daily 3)  Aspirin 81 Mg Tbec (Aspirin) .... Take One Tablet By Mouth Daily 4)  Multivitamins   Tabs (Multiple Vitamin) .... Once Daily 5)  Starlix 120 Mg Tabs (Nateglinide) .Marland Kitchen.. 1 Tab Two Times A Day 6)  [name] 500 Mcg Caps (Dofetilide) .... One  By Mouth Two Times A Day 7)  Januvia 100 Mg Tabs (Sitagliptin Phosphate) .... One By Mouth Daily 8)  Magnesium Oxide 250 Mg Tabs (Magnesium Oxide) .... Take 1 Tablet By Mouth Once A Day 9)  Ocuvite-Lutein  Caps (Multiple Vitamins-Minerals) .... Take 1 Capsule By Mouth Once A Day 10)  Metoprolol Succinate 50 Mg Xr24h-Tab (Metoprolol Succinate) .... One Tablet Daily 11)  Centrum Silver  Tabs (Multiple Vitamins-Minerals) .... Once Daily  Allergies (verified): 1)  ! 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 Coronary artery bypass grafting.   2. AICD placement.   3. Hemorrhoidectomy.   4. Right arthroscopic knee repair.   Family History: Last updated: May 12, 2008  Father died of emphysema at age 60.  Mother lived up  into her 28s and had history of stroke and arthritis.  Social History: Last updated: May 12, 2008 Married  Alcohol Use - yes Drug Use - no Tobacco Use - No.   Risk Factors: Smoking Status: never (05-12-08)  Vital Signs:  Patient profile:   77 year old male Height:      74 inches Weight:      209.50 pounds BMI:     27.00 Pulse rate:   67 / minute BP sitting:   92 / 56  (left arm) Cuff size:   regular  Vitals Entered By: Sander Radon  Delford Field CMA (December 28, 2009 1:59 PM)  Physical Exam  General:  The patient was alert and oriented in no acute distress. HEENT Normal.  Neck veins were flat, carotids were brisk.  Lungs were clear.  Heart sounds were regular without murmurs or gallops.  Abdomen was soft with active bowel sounds. There is no clubbing cyanosis or edema. Skin Warm and dry    EKG  Procedure date:  12/28/2009  Findings:      sinus rhythm with frequent PACs and  a first degree AV block of about  320 ms frequent PVCs   ICD Specifications Following MD:  Sherryl Manges, MD     Referring MD:  [facility] ICD Vendor:  St Jude     ICD Model Number:  870 [id number] 1191     ICD Serial Number:  [id number] ICD DOI:  06/17/2008     ICD Implanting MD:  Sherryl Manges, MD  Lead 1:    Location: RA     DOI: 04/08/2002     Model #: [id number]     Serial #: [id number]     Status: active Lead 2:    Location: RV     DOI: 04/08/2002     Model #: [id number]     Serial #: [id number]     Status: active  Indications::  POLYMORPHIC VT, SYNCOPE  Explantation Comments: 06/17/2008 Prizm [id number] explanted  ICD Follow Up Battery Voltage:  3.14 V     Charge Time:  11.1 seconds     Battery Est. Longevity:  4.9 yrs Underlying rhythm:  SR ICD Dependent:  No       ICD Device Measurements Atrium:  Amplitude: 2.6 mV, Impedance: 430 ohms, Threshold: 0.75 V at 0.5 msec Right Ventricle:  Amplitude: 10.8 mV, Impedance: 450 ohms, Threshold: 0.5 V at 0.5 msec Shock Impedance: 41 ohms   Episodes MS Episodes:  553     Percent Mode Switch:  4.2%     Coumadin:  No Shock:  0     ATP:  0     Nonsustained:  0     Atrial Therapies:  0 Atrial Pacing:  79%     Ventricular Pacing:  65%  Brady Parameters Mode DDDR     Lower Rate Limit:  65     Upper Rate Limit 105 PAV 225     Sensed AV Delay:  200  Tachy Zones VF:  240     VT:  214     VT1:  171     Next Remote Date:  03/31/2010     Next Cardiology Appt Due:  12/01/2010 Tech Comments:  553 AF EPISODES--LONGEST WAS 1 DAY 14 HOURS.  NORMAL DEVICE FUNCTION.  NO CHANGES MADE. MERLIN 03-31-10 AND ROV IN 12 MTHS W/SK. Vella Kohler  December 28, 2009 2:31 PM  Impression & Recommendations:  Problem # 1:  IMPLANTABLE DEFIBRILLATOR ST JUDE DDD (ICD-V45.02) Device parameters and data were reviewed and no changes were made  Problem # 2:  CARDIOMYOPATHY, ISCHEMIC S/P CABG (ICD-414.8)  stable on aspirin His updated medication list for this problem includes:    Aspirin 81 Mg  Tbec (Aspirin) .Marland Kitchen... Take one tablet by mouth daily    [name] 500 Mcg Caps (Dofetilide) ..... One by mouth two times a day    Metoprolol Succinate 50 Mg Xr24h-tab (Metoprolol succinate) ..... One tablet daily  Orders: TLB-Magnesium (Mg) (83735-MG) TLB-BMP (Basic Metabolic Panel-BMET) (80048-METABOL)  Problem # 3:  ATRIAL FIBRILLATION (ICD-427.31) no significant intercurrent atrial fibrillation on [name]  His updated medication list for this problem includes:    Aspirin 81 Mg Tbec (Aspirin) .Marland Kitchen... Take one tablet by mouth daily    [name] 500 Mcg Caps (Dofetilide) ..... One by mouth two times a day    Metoprolol Succinate 50 Mg Xr24h-tab (Metoprolol succinate) ..... One tablet daily  Orders: EKG w/ Interpretation (93000) TLB-Magnesium (Mg) (83735-MG) TLB-BMP (Basic Metabolic Panel-BMET) (80048-METABOL)  Problem # 4:  VENTRICULAR TACHYCARDIA-POLYMORPHIC (ICD-427.1)  no recurrent ventricular tachycardia;  will continue [name]; He need potassium and magnesium which will check today His updated medication list for this problem includes:    Aspirin 81 Mg Tbec (Aspirin) .Marland Kitchen... Take one tablet by mouth daily    [name] 500 Mcg Caps (Dofetilide) ..... One by mouth two times a day    Metoprolol Succinate 50 Mg Xr24h-tab (Metoprolol succinate) ..... One tablet daily  Orders: TLB-Magnesium (Mg) (83735-MG) TLB-BMP (Basic Metabolic Panel-BMET) (80048-METABOL)  Patient Instructions: 1)  Your physician recommends that you continue on your current medications as directed. Please refer to the Current Medication list given to you today. 2)  Your physician wants you to follow-up in: 1 year   You will receive a reminder letter in the mail two months in advance. If you don't receive a letter, please call our office to schedule the follow-up appointment.

## 2010-03-04 NOTE — Cardiovascular Report (Signed)
Summary: Office Visit   Office Visit   Imported By: Roderic Ovens 07/23/2009 09:56:59  _____________________________________________________________________  External Attachment:    Type:   Image     Comment:   External Document

## 2010-03-21 ENCOUNTER — Encounter: Payer: Self-pay | Admitting: Internal Medicine

## 2010-03-25 ENCOUNTER — Telehealth (INDEPENDENT_AMBULATORY_CARE_PROVIDER_SITE_OTHER): Payer: Self-pay | Admitting: *Deleted

## 2010-03-29 NOTE — Progress Notes (Signed)
  Phone Note Outgoing Call   Call placed by: Scherrie Bateman, LPN,  March 25, 2010 10:02 AM Call placed to: Patient Summary of Call: PT AWARE [name] RECIEVED WILL LEAVE  AT FRONT DESK FOR PICK UP PER PT TO PICK UP ON TUES 03/29/10 Initial call taken by: Scherrie Bateman, LPN,  March 25, 2010 10:03 AM

## 2010-03-31 ENCOUNTER — Encounter (INDEPENDENT_AMBULATORY_CARE_PROVIDER_SITE_OTHER): Payer: Medicare Other

## 2010-03-31 DIAGNOSIS — I472 Ventricular tachycardia: Secondary | ICD-10-CM

## 2010-03-31 DIAGNOSIS — I4729 Other ventricular tachycardia: Secondary | ICD-10-CM

## 2010-04-01 ENCOUNTER — Encounter: Payer: Self-pay | Admitting: Internal Medicine

## 2010-04-18 ENCOUNTER — Encounter: Payer: Self-pay | Admitting: *Deleted

## 2010-04-19 NOTE — Letter (Signed)
Summary: Connection to Care  Connection to Care   Imported By: Marylou Mccoy 04/13/2010 10:10:27  _____________________________________________________________________  External Attachment:    Type:   Image     Comment:   External Document

## 2010-04-28 NOTE — Letter (Signed)
Summary: Remote Device Check  [employer], Main Office  [address]   [place], [place] [address]   Phone: [phone]  Fax: [phone]     April 18, 2010 MRN: [id number]   [facility] [address] [place], [place]  [address]   Dear Mr. [facility],   Your remote transmission was recieved and reviewed by your physician.  All diagnostics were within normal limits for you.  __X___Your next transmission is scheduled for:  06-30-2010.  Please transmit at any time this day.  If you have a wireless device your transmission will be sent automatically.    Sincerely,  Vella Kohler

## 2010-04-28 NOTE — Cardiovascular Report (Signed)
Summary: Office Visit   Office Visit   Imported By: Roderic Ovens 04/19/2010 16:19:45  _____________________________________________________________________  External Attachment:    Type:   Image     Comment:   External Document

## 2010-05-10 LAB — BASIC METABOLIC PANEL
BUN: 10 mg/dL (ref 6–23)
BUN: 11 mg/dL (ref 6–23)
BUN: 11 mg/dL (ref 6–23)
BUN: 13 mg/dL (ref 6–23)
CO2: 26 mEq/L (ref 19–32)
CO2: 27 mEq/L (ref 19–32)
CO2: 28 mEq/L (ref 19–32)
CO2: 29 mEq/L (ref 19–32)
Calcium: 8.7 mg/dL (ref 8.4–10.5)
Calcium: 8.8 mg/dL (ref 8.4–10.5)
Calcium: 8.8 mg/dL (ref 8.4–10.5)
Calcium: 9.5 mg/dL (ref 8.4–10.5)
Chloride: 103 mEq/L (ref 96–112)
Chloride: 103 mEq/L (ref 96–112)
Chloride: 104 mEq/L (ref 96–112)
Chloride: 106 mEq/L (ref 96–112)
Creatinine, Ser: 0.82 mg/dL (ref 0.4–1.5)
Creatinine, Ser: 0.9 mg/dL (ref 0.4–1.5)
Creatinine, Ser: 0.92 mg/dL (ref 0.4–1.5)
Creatinine, Ser: 1 mg/dL (ref 0.4–1.5)
GFR calc Af Amer: >60 mL/min (ref >60)
GFR calc Af Amer: >60 mL/min (ref >60)
GFR calc Af Amer: >60 mL/min (ref >60)
GFR calc Af Amer: >60 mL/min (ref >60)
GFR calc non Af Amer: >60 mL/min (ref >60)
GFR calc non Af Amer: >60 mL/min (ref >60)
GFR calc non Af Amer: >60 mL/min (ref >60)
GFR calc non Af Amer: >60 mL/min (ref >60)
Glucose, Bld: 117 mg/dL — ABNORMAL HIGH (ref 70–99)
Glucose, Bld: 122 mg/dL — ABNORMAL HIGH (ref 70–99)
Glucose, Bld: 139 mg/dL — ABNORMAL HIGH (ref 70–99)
Glucose, Bld: 153 mg/dL — ABNORMAL HIGH (ref 70–99)
Potassium: 4.3 mEq/L (ref 3.5–5.1)
Potassium: 4.3 mEq/L (ref 3.5–5.1)
Potassium: 4.4 mEq/L (ref 3.5–5.1)
Potassium: 5.2 mEq/L — ABNORMAL HIGH (ref 3.5–5.1)
Sodium: 136 mEq/L (ref 135–145)
Sodium: 136 mEq/L (ref 135–145)
Sodium: 137 mEq/L (ref 135–145)
Sodium: 140 mEq/L (ref 135–145)

## 2010-05-10 LAB — CBC
HCT: 41.8 % (ref 39.0–52.0)
HCT: 41.9 % (ref 39.0–52.0)
Hemoglobin: 14.6 g/dL (ref 13.0–17.0)
Hemoglobin: 14.7 g/dL (ref 13.0–17.0)
MCHC: 34.8 g/dL (ref 30.0–36.0)
MCHC: 35.1 g/dL (ref 30.0–36.0)
MCV: 99.2 fL (ref 78.0–100.0)
MCV: 99.3 fL (ref 78.0–100.0)
Platelets: 119 10*3/uL — ABNORMAL LOW (ref 150–400)
Platelets: 127 10*3/uL — ABNORMAL LOW (ref 150–400)
RBC: 4.21 MIL/uL — ABNORMAL LOW (ref 4.22–5.81)
RBC: 4.22 MIL/uL (ref 4.22–5.81)
RDW: 13.4 % (ref 11.5–15.5)
RDW: 13.5 % (ref 11.5–15.5)
WBC: 4.3 10*3/uL (ref 4.0–10.5)
WBC: 4.3 10*3/uL (ref 4.0–10.5)

## 2010-05-10 LAB — GLUCOSE, CAPILLARY
Glucose-Capillary: 100 mg/dL — ABNORMAL HIGH (ref 70–99)
Glucose-Capillary: 106 mg/dL — ABNORMAL HIGH (ref 70–99)
Glucose-Capillary: 123 mg/dL — ABNORMAL HIGH (ref 70–99)
Glucose-Capillary: 125 mg/dL — ABNORMAL HIGH (ref 70–99)
Glucose-Capillary: 134 mg/dL — ABNORMAL HIGH (ref 70–99)
Glucose-Capillary: 138 mg/dL — ABNORMAL HIGH (ref 70–99)
Glucose-Capillary: 151 mg/dL — ABNORMAL HIGH (ref 70–99)
Glucose-Capillary: 153 mg/dL — ABNORMAL HIGH (ref 70–99)
Glucose-Capillary: 153 mg/dL — ABNORMAL HIGH (ref 70–99)
Glucose-Capillary: 155 mg/dL — ABNORMAL HIGH (ref 70–99)
Glucose-Capillary: 182 mg/dL — ABNORMAL HIGH (ref 70–99)
Glucose-Capillary: 93 mg/dL (ref 70–99)

## 2010-05-10 LAB — MAGNESIUM: Magnesium: 2.3 mg/dL (ref 1.5–2.5)

## 2010-05-10 LAB — PROTIME-INR
INR: 1.1 (ref 0.00–1.49)
Prothrombin Time: 14 seconds (ref 11.6–15.2)

## 2010-05-10 LAB — DIFFERENTIAL
Basophils Absolute: 0 10*3/uL (ref 0.0–0.1)
Basophils Relative: 1 % (ref 0–1)
Eosinophils Absolute: 0.2 10*3/uL (ref 0.0–0.7)
Eosinophils Relative: 5 % (ref 0–5)
Lymphocytes Relative: 13 % (ref 12–46)
Lymphs Abs: 0.6 10*3/uL — ABNORMAL LOW (ref 0.7–4.0)
Monocytes Absolute: 0.6 10*3/uL (ref 0.1–1.0)
Monocytes Relative: 14 % — ABNORMAL HIGH (ref 3–12)
Neutro Abs: 2.8 10*3/uL (ref 1.7–7.7)
Neutrophils Relative %: 66 % (ref 43–77)

## 2010-05-10 LAB — APTT: aPTT: 29 seconds (ref 24–37)

## 2010-05-20 ENCOUNTER — Telehealth: Payer: Self-pay | Admitting: Internal Medicine

## 2010-05-20 NOTE — Telephone Encounter (Signed)
Faxed Device Check to [facility] @ [facility] ([phone]).

## 2010-05-23 ENCOUNTER — Telehealth: Payer: Self-pay | Admitting: Cardiology

## 2010-05-23 NOTE — Telephone Encounter (Signed)
[phone] SURGERY [facility] FOR

## 2010-06-14 NOTE — Letter (Signed)
October 02, 2007    Colleen Can. Deborah Chalk, M.D.  [address], [address]  [place], [place] [address]   RE:  [name], [name]  MRN:  [id number]  /  DOB:  08/12/1931   Dear [name],   [name] comes in 6 months after his polymorphic VT/VF episode.  He  has had no intercurrent of ventricular arrhythmias.  He is feeling quite  well.   Review of his medications is notable for the absence of Coumadin.  He  reminds me that he had problems with Coumadin does not take it.  He does  take Actos, Januvia for his diabetes, and sotalol 160 b.i.d. as well as  aspirin.  He is currently on simvastatin.   PHYSICAL EXAMINATION:  VITAL SIGNS:  Blood pressure today is 124/74, the  pulse is 66, his weight was 220, which is stable.  LUNGS:  Clear.  HEART:  Sounds were regular with S4.  ABDOMEN:  Soft.  EXTREMITIES:  Had no edema.   Interrogation of his Guidant device demonstrated P-wave of 4.3,  impedance of 43, threshold of 0.4 at 0.5 in both chambers.  The R-wave  was 13.3 and impedance was 683.  Battery voltage 2.58.  There is no  intercurrent episodes of ventricular arrhythmias.  There were multiple  episodes of atrial fibrillation.   IMPRESSION:  1. Ventricular tachycardia/polymorphic ventricular tachycardia with      appropriate therapy.  2. Status post implantable cardioverter-defibrillator for primary      prevention subsequently with appropriate therapy.  3. Paroxysmal atrial fibrillation, not on Coumadin because of      intolerance.  4. Coronary artery disease with prior bypass surgery and mild-to-      moderate left ventricular dysfunction; status post mitral valve      repair.   [name], [name] is doing pretty well from a ventricular arrhythmia  point of view.   He continues to have atrial fibrillation and I had told him the results  of the RE-LY trial which hopefully will bring dibigatran to market in  the next year or so as an alternative to Coumadin.   We will see him again  in 1 year's time.  We will continue to follow  device remotely in the interim.  If there is anything else I can do,  please do not hesitate to contact me.    Sincerely,      Duke Salvia, MD, [facility]  Electronically Signed    SCK/MedQ  DD: 10/02/2007  DT: 10/03/2007  Job #: [id number]

## 2010-06-14 NOTE — Op Note (Signed)
NAME:  EDIS, HUISH NO.:  [id number]   MEDICAL RECORD NO.:  [id number]          PATIENT TYPE:  INP   LOCATION:  2899                         FACILITY:  [facility]   PHYSICIAN:  Duke Salvia, MD, FACCDATE OF BIRTH:  1931-10-19   DATE OF PROCEDURE:  06/17/2008  DATE OF DISCHARGE:                               OPERATIVE REPORT   PREOPERATIVE DIAGNOSES:  Polymorphic ventricular tachycardia, previously  implanted implantable cardioverter-defibrillator, now at end-of-life.   POSTOPERATIVE DIAGNOSES:  Polymorphic ventricular tachycardia,  previously implanted implantable cardioverter-defibrillator, now at end-  of-life.   PROCEDURES:  Defibrillator explantation, pocket revision, defibrillator  implantation, and intraoperative defibrillation threshold testing.   Following obtaining informed consent, the patient was brought to the  Electrophysiology Laboratory, placed on the fluoroscopic table in supine  position after routine prep and drape, lidocaine was infiltrated with a  line of the previous incision and carried down to layer of the device  pocket using sharp dissection with electrocautery.  The pocket was  opened.  It was somewhat porcelainized.  The device was explanted.  The  leads were freed up over the first 2-3 inches.  Part of the pocket floor  was excavated in its thickest most cephalad portion and the pocket had  to be extended about 2 inches caudally to allow for the larger  defibrillator that was to be implanted.   This was accomplished, the previously implanted Medtronic 5076 atrial  lead serial number [id number] demonstrated an amplitude of 4.2 with  impedance of 482 with threshold of 0.4 at 0.5 with a current threshold  of 0.8 mA.   The previously implanted [facility] scientific [id number] lead serial number  [id number] demonstrated an R-wave of 18.5 with impedance of 656, a threshold  of 1 volt at 0.5.  Current threshold is 1.9 mA.  These leads were then  secured to a current DR ICD model and CD2211, serial number [id number] and  through the device bipolar P-wave was 3.7 with impedance of 410, a  threshold of 0.75 at 0.5.  The R-wave was 9.9 with impedance of 460,  threshold of 0.5 at 0.5.  High-voltage impedance was 43 ohms.   Ventricular fibrillation was then induced to check DFT.  After a total  duration of 7.5 seconds, a 20-joule shock was delivered through a  measured resistance of 38 ohms terminating ventricular fibrillation  restoring sinus rhythm.  The device was implanted.  The pocket was  copiously irrigated with antibiotic containing saline solution.  Hemostasis was assured.  The leads in the pulse generator were placed in  the pocket, secured with prepectoral fascia.  The wound was closed in  three layers in normal fashion.  I should note that I put Surgicel on  the floor of the pocket where I had excavated the scar down to the  pectoralis muscle as well as on the cephalad aspect where I had extent  of  the pocket a little bit to allow for easier closure.  The wound was  closed in three layers.  The wound was washed, dried, a benzoin Steri-  Strip dressing was applied.  Needle counts, sponge counts, and  instrument counts were correct at the end of the procedure according to  staff.  The patient tolerated the procedure without apparent  complication.      Duke Salvia, MD, [facility]  Electronically Signed     SCK/MEDQ  D:  06/17/2008  T:  06/18/2008  Job:  [id number]

## 2010-06-14 NOTE — H&P (Signed)
NAME:  [name], [name] NO.:  [id number]   MEDICAL RECORD NO.:  [id number]          PATIENT TYPE:  INP   LOCATION:  1844                         FACILITY:  [facility]   PHYSICIAN:  Thomas C. Wall, MD, FACCDATE OF BIRTH:  [birth year]   DATE OF ADMISSION:  04/01/2007  DATE OF DISCHARGE:                              HISTORY & PHYSICAL   PRIMARY CARDIOLOGIST:  Colleen Can. Deborah Chalk, M.D.   ELECTROPHYSIOLOGIST:  Duke Salvia, MD, [facility]   PRIMARY CARE PHYSICIAN:  Doris Cheadle. Foy Guadalajara, M.D.   HISTORY OF PRESENT ILLNESS:  This is a 78 year old Caucasian male with  known history of CAD, coronary artery bypass grafting, AICD pacemaker  placed in 2003 per Dr. Graciela Husbands secondary to nonsustained exercise induced  polymorphic ventricular tachycardia along with a history of diabetes who  was in his usual state of health and was outside in his driveway as this  is a very snowy day and was blowing snow off of his driveway with a snow  blower.  His wife was with him.  Suddenly, his wife noticed that he had  fallen over the snow blower and actually fell to the ground.  He had  turned blue, and his wife noticed that his defibrillator fired.  The  patient began to wake up slowly.  She went and got her neighbor who came  over to help her, and they both called EMS.  The patient awoke.  They  wrapped him up in blankets and waited for EMS to come.  The patient does  not remember the episode or the defibrillation, he just remembers using  the snow blower and then waking up in the snow.  The patient normally  sees Dr. Deborah Chalk and saw him approximately four months ago.  He also  sees Dr. Sherryl Manges periodically and last saw him in December of 2008.  He also has his Guidant defibrillator pacemaker checked once a week for  interrogation and has had no problems.  The patient is currently here in  the emergency room without any complaint, and his wife is at bedside,  able to verify all of the events.   REVIEW OF SYSTEMS:  Negative for chest pain, shortness of breath,  nausea, vomiting, or dyspnea.  The patient did have a syncopal episode.   PAST MEDICAL HISTORY:  Two-vessel coronary artery bypass grafting and  mitral valve repair at [facility] in 2001, atrial  fibrillation, nonsustained ventricular polymorphic tachycardia, exercise-  induced status post Guidant AICD placed in March of 2004 per Dr. Sherryl Manges, blue toe syndrome and diabetes.  Most recent cath was completed  by Dr. Deborah Chalk in January of 2008 revealing patent grafts with an LVEF  of 45%.   PAST SURGICAL HISTORY:  1. Coronary artery bypass grafting.  2. AICD placement.  3. Hemorrhoidectomy.  4. Right arthroscopic knee repair.   SOCIAL HISTORY:  The patient lives in [place] with his wife.  He is a  retired Product/process development scientist.  He stopped smoking 45 years ago.  He has two  drinks daily.  He does not use drugs.  FAMILY HISTORY:  Mother died of a CVA in her 38s and father died as a  result of emphysema in his 41s.   CURRENT MEDICATIONS:  1. Actos 45 mg daily.  2. Aspirin 325 mg daily.  3. Januvia 100 mg q day.  4. Sotalol 160 b.i.d.  5. Simvastatin 80 mg once a day.  6. Allegra 60 mg daily.   ALLERGIES:  COUMADIN CAUSING SEVERE MIGRAINE HEADACHES.   CURRENT LABS:  Hemoglobin 14.4, hematocrit 41.5, white blood cells 5.9,  platelets 124.  All other labs are pending.  EKG revealing bigeminy,  atrial fibrillation, ventricular rate of 67 beats per minute.   PHYSICAL EXAMINATION:  VITAL SIGNS:  Blood pressure 143/90, pulse 77,  respirations 22, temperature 96.7.  O2 sat 100% at 2 liters.  HEENT:  Head is normocephalic, atraumatic.  Eyes:  PERRLA.  Mucous  membranes of mouth are pink and moist.  Tongue midline.  NECK:  Supple, no JVD or carotid bruits appreciated.  CARDIOVASCULAR:  Irregular rhythm, distant heart sounds without murmurs,  rubs or gallops.  LUNGS:  Essentially clear to auscultation  without wheezes, rales or  rhonchi.  ABDOMEN:  Obese, nontender, 2+ bowel sounds, no rebound or guarding.  EXTREMITIES:  With clubbing.  There is some mild cyanosis.  They are  very cold to touch with 1+ dorsalis pedis pulses bilaterally.  NEURO:  Cranial nerves II-XII were grossly intact.   IMPRESSION:  1. Exercise-induced ventricular tachycardia (probably what occurred      today, negative for angina or ischemic symptoms).  2. Known history of coronary artery disease  status post coronary      artery bypass grafting and mitral valve repair in 2001.  3. History of atrial fibrillation.  4. Blue toe syndrome.  5. Diabetes.   PLAN:  1. The patient has been seen and examined by myself and Dr. Daleen Squibb in      the emergency room.  The patient appears stable at present with no      memory of the event.  The patient is not complaining of chest pain      or shortness of breath.  We will have pacemaker interrogated by      Guidant pacemaker representative for evaluation and event causing      the patient's defibrillator to discharge.  2. The patient will be followed by Dr. Deborah Chalk in the morning.  3. We will cycle cardiac enzymes.  4. We will continue the patient on current medication regimen without      any changes and return the patient to the services of Dr. Deborah Chalk.      Bettey Mare. Lyman Bishop, NP      Jesse Sans. Daleen Squibb, MD, [facility]  Electronically Signed    KML/MEDQ  D:  04/01/2007  T:  04/01/2007  Job:  [id number]

## 2010-06-14 NOTE — Letter (Signed)
February 18, 2008    [name]. Tony Chalk, MD  [address], [address]  [place], [place] [address]   RE:  Tony, [name]  MRN:  [id number]  /  DOB:  1931/10/31   Dear [name],   [name] came in today in followup for his polymorphic VT in the  setting of ischemic heart disease.  He has had no intercurrent  ventricular tachycardia.   He has had some atrial fibrillation which he detects most frequently in  the morning and most easily when he is on his exercise machine as he  identifies rapid rates.  He has had no changes in exercise performance,  however.   MEDICATIONS:  1. Actos 45.  2. Januvia 100.  3. Simvastatin.  4. Aspirin 325.  5. Sotalol 160 b.i.d.  6. Allegra.   PHYSICAL EXAMINATION:  VITAL SIGNS:  His blood pressure today was  111/76.  His pulse was 63.  His weight was 214, which is down 10 pounds  from the last year.  LUNGS:  Clear.  HEART:  Sounds were regular.  NECK:  Veins were flat.  EXTREMITIES:  Without edema.   Interrogation of his Guidant ICD demonstrates that he had MOL2 with  about 20% of his battery left.  His P-wave was 4.2 and impedance of 460.  The threshold was 1 volt at 0.5.  The R-wave was 14.6 with impedance of  665.  The threshold was 0.6 at 0.5.  He is atrially paced about 80% of  the time and his atrial fibrillation burden appears to be relatively  stable comparing it to previous months, although there was certainly a  flare of it.  There has been a flare over the last couple of months.   IMPRESSION:  1. Polymorphic ventricular tachycardia.  2. Status post implantable cardioverter-defibrillator for the above      with appropriate therapy.  3. Atrial fibrillation.  4. Ischemic heart disease.  5. Diabetes.  6. Intolerant of Coumadin.   [name] atrial fibrillation is recurring.  I thought, [name], that it  made sense to check his electrolytes, magnesium and he is supposed to  have blood work drawn by Dr. Dagoberto [name] next week.  I have asked  him to add  that on.   As relates to anticoagulation, we talked again about the bigger trend,  and we will have to wait for its release, but at that point, it would be  reasonable to begin [name] on that for thromboembolic risk  reduction.   He continues the sotalol for VT.  We will see him again in 1 year's  time.    Sincerely,      Tony Salvia, MD, [facility]  Electronically Signed    SCK/MedQ  DD: 02/18/2008  DT: 02/19/2008  Job #: [id number]   CC:    [name]. [name], M.D.

## 2010-06-14 NOTE — Discharge Summary (Signed)
NAME:  Tony, [name] NO.:  [id number]   MEDICAL RECORD NO.:  [id number]          PATIENT TYPE:  INP   LOCATION:  2503                         FACILITY:  [facility]   PHYSICIAN:  Doylene Canning. Ladona Ridgel, MD    DATE OF BIRTH:  1931-09-09   DATE OF ADMISSION:  06/17/2008  DATE OF DISCHARGE:  06/20/2008                               DISCHARGE SUMMARY   PRIMARY CARDIOLOGIST:  Colleen Can. Deborah Chalk, MD   ELECTROPHYSIOLOGIST:  Duke Salvia, MD, [facility]   PRIMARY CARE Sheryle Vice:  Alfonse Alpers. Dagoberto Ligas, MD   DISCHARGE DIAGNOSIS:  Polymorphic ventricular tachycardia.   SECONDARY DIAGNOSES:  1. [name] initiation.  2. Status post implantable cardioverter-defibrillator generator change      out with placement of a [employer] CURRENT + DR CD 2211 - 36      on Jun 17, 2008.  3. History of Raynaud disease, exacerbated by beta-one selective beta-      blocker.  4. History of atrial fibrillation.  5. Moderate nonobstructive coronary artery disease.  6. Diabetes mellitus.  7. History of Coumadin intolerance.   ALLERGIES:  COUMADIN.   PROCEDURES:  Successful automatic implantable cardioverter-defibrillator  change out with placement of a [employer] CURRENT + DR CD 2211 -  36, serial number [id number].   HISTORY OF PRESENT ILLNESS:  A 77 year old male with the above problem  list.  He was recently seen by Dr. Graciela Husbands in clinic on April 23.  The  patient had previously been on long-term sotalol therapy secondary to  polymorphic ventricular tachycardia, however, appeared to have  exacerbation of Raynaud's, thus prompting discontinuation of the  sotalol.  Unfortunately, as a result, he had increasing episodes of  ectopy and VT.  It is also noted that his ICD was approaching elective  replacement indicators.  Decision was made to bring him into the  hospital for ICD change out and [name] initiation.   HOSPITAL COURSE:  The patient presented to the EP lab on May 19.  He  underwent  successful ICD generator change out with placement of a [employer] CURRENT + DR dual-chamber ICD.  He tolerated this procedure  well and postprocedure was initiated on [name] therapy.  His QTc  remained stable and his dose was increased to 500 mcg daily.  He has  tolerated this just fine, and we will plan to discharge him home today  in good condition.   DISCHARGE LABORATORIES:  Hemoglobin 14.7, hematocrit 41.9, WBC 4.3, and  platelets 119.  Sodium 136, potassium 4.3, chloride 103, CO2 of 27, BUN  13, creatinine 0.92, glucose 153, calcium 8.8, and magnesium 2.3.   DISPOSITION:  The patient will be discharged home today in good  condition.   FOLLOWUP PLANS AND APPOINTMENTS:  We will arrange for a wound check in  approximately 10 days.  Follow up with Dr. Graciela Husbands in 3 months.  Otherwise, follow up with Dr. Deborah Chalk and Dr. Dagoberto Ligas as previously  scheduled.   DISCHARGE MEDICATIONS:  1. [name] 500 mcg every 12 hours.  2. Actos 45 mg daily.  3. Allegra 60  mg daily.  4. Lipitor 80 mg daily.  5. Aspirin 325 mg daily.  6. Multivitamin daily.  7. Starlix 120 mg b.i.d.  8. Januvia 100 mg daily.  9. Magnesium 250 mg daily.  10.Ocuvite 1 tab daily.   OUTSTANDING LABORATORY STUDIES:  None.   DURATION OF DISCHARGE/ENCOUNTER:  35 minutes including physician time.      Nicolasa Ducking, ANP      Doylene Canning. Ladona Ridgel, MD  Electronically Signed    CB/MEDQ  D:  06/20/2008  T:  06/20/2008  Job:  [id number]   cc:   Colleen Can. Deborah Chalk, M.D.  Alfonse Alpers. Dagoberto Ligas, M.D.

## 2010-06-14 NOTE — Consult Note (Signed)
NAME:  [name], [name] NO.:  [id number]   MEDICAL RECORD NO.:  [id number]          PATIENT TYPE:  INP   LOCATION:  4712                         FACILITY:  [facility]   PHYSICIAN:  [name]. Tony Ridgel, MD    DATE OF BIRTH:  [birth year]   DATE OF CONSULTATION:  04/01/2007  DATE OF DISCHARGE:                                 CONSULTATION   [name] is referred today by Dr. Juanito [name] for evaluation of ICD  discharge in the setting of polymorphic ventricular tachycardia.   HISTORY OF PRESENT ILLNESS:  The patient is a 77 year old man with known  coronary disease and mitral valve disease status post mitral valve  angioplasty and repair in 2001.  At that time he underwent bypass  surgery and had a PFO closure.  The patient has a history of exercise-  induced ventricular tachycardia demonstrated by treadmill, though he  never had a symptom with it.  He underwent implantation of a dual-  chamber ICD in March 2004.  He has a history of atrial fibrillation in  the past but is not on Coumadin thought secondary to allergies from  his Coumadin; but at any rate,  he was felt to be contraindicated for  Coumadin.  The patient was outside earlier today in his usual state of  health.  He notes that he has been feeling well with no specific  complaints other than usual fatigue.  He experienced an ICD shock while  he was running his snow blower.  He notes that he was not doing anything  particularly strenuous during this episode.  He slumped over to the  ground. His wife was with him and called 9-1-1. When the patient awoke,  he noted that the snow blower was still on and nearly on top of him.  He  eventually was taken to the emergency department and admitted for  additional evaluation.  The patient has had no other anginal symptoms  and denies heart failure symptoms.  He denies medical or dietary  noncompliance.   PAST MEDICAL HISTORY:  Notable for:  1. Diabetes for over 40 years.  2. He  has a history of blue toe syndrome with ulcers in his lower      extremities and recent angiogram of his lower extremities by Dr.      Edilia [name] reportedly negative.  3. He is a history of dyslipidemia.  4. He has a history of paroxysmal atrial fibrillation, though he has      not been able to tolerate Coumadin secondary to some sort of      allergy.  He is on chronic sotalol therapy for his polymorphic      ventricular tachycardia as well as his atrial fibrillation.  The      patient's heart failure really is class II at most.   SOCIAL HISTORY:  The patient lives in [place] with his wife.  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 He drinks a scotch a  day.  He denies alcohol abuse.   FAMILY HISTORY:  Notable for mother dying  of a stroke and father with  emphysema.   REVIEW OF SYSTEMS:  Is negative except as noted in the HPI, otherwise  all systems reviewed and negative except as noted.   PHYSICAL EXAMINATION:  GENERAL:  He is a pleasant, well-appearing man in  no acute distress.  VITAL SIGNS:  Blood pressure was 143/90. The pulse was 76 and irregular.  Respirations were 18 to 24, temperature 98.  HEENT:  Normocephalic and atraumatic.  Pupils equal and round.  Oropharynx moist.  Sclerae anicteric.  NECK:  Revealed no jugular distention.  No thyromegaly.  LUNGS:  Clear bilaterally to auscultation.  No wheezes, rales, or  rhonchi are present.  CARDIOVASCULAR:  Exam revealed a irregular rate and rhythm with normal  S1-S2.  I did not appreciate any obvious murmurs.  The PMI was enlarged  and laterally displaced.  There are occasional PVCs.  ABDOMEN:  Soft, nontender and nondistended.  There was no evidence of  organomegaly.  Bowel sounds are present.  No rebound or guarding.  EXTREMITIES:  Demonstrated no cyanosis, clubbing or edema.  The pulses  were 2+ and symmetric.   IMPRESSION:  1. Polymorphic ventricular tachycardia degenerated into ventricular       fibrillation status post successful implantable cardioverter-      defibrillator discharge.  2. History of mitral valve repair and bypass surgery in the past.  3. Known coronary disease with recent catheterization approximately a      year ago according to the patient demonstrating no obstruction.   DISCUSSION:  I have reviewed the patient's intracardiac electrograms.  It is noted that the patient had a fair amount of ventricular ectopy and  a PVC which resulted in initiation of his polymorphous VT which very  quickly degenerated into VF.  It is hard to conclude whether this  arrhythmia was initiated by the PVC or just by the catecholamine surge  from being up in the early morning and using the snow blower.  The  patient states he had a catheterization a year ago, and normally with an  episode like this, we would recommend heart catheterization.  He carries  this diagnosis of blue toe syndrome, though I do not have a history  about this at present. If in fact he does have this, then coronary  angiography would be relatively contraindicated.  I will plan discuss  the case and additional diagnostic evaluation with Dr. Berton [name] who  is his primary EP doctor, and Dr. Delfin [name].      [name]. Tony Ridgel, MD  Electronically Signed     GWT/MEDQ  D:  04/01/2007  T:  04/01/2007  Job:  [id number]   cc:   [name], M.D.  [name], M.D.

## 2010-06-14 NOTE — Assessment & Plan Note (Signed)
[facility]                         ELECTROPHYSIOLOGY OFFICE NOTE   NAME:[name], [name]                       MRN:          [id number]  DATE:01/16/2007                            DOB:          1931/10/27    Mr. Julia is seen following ICD implantation for ventricular  tachycardia.  He had no intercurrent discharges.  He continues to have  episodes of atrial fibrillation.   He has no chest pain.  He is status post bypass grafting, PFO closure  and mitral valve repair at the [facility].   His major concern today is regarding this new device that he saw tested  at [facility] for migraine headache therapy.  It releases an electronic  signal, so he was wondering whether it was compatible with his wife.  We  will have to get some more information.   EXAMINATION:  His blood pressure is 118/64, his pulse was 65.  His lungs were clear.  Heart sounds were regular.  The extremities were without edema.   Interrogation of his Guidant Prizm II device demonstrates a P-wave of 4  with impedance of 460 and a threshold of 0.6 at 0.4.  The R-wave is 9.6  with impedance of 674, threshold of 0.4 at 0.4.  He is atrially paced  80%, ventricular paced 53% of the time, and his AV delay is programmed  at 300 milliseconds.   IMPRESSION:  1. Polymorphic ventricular tachycardia.  2. Status post ICD for the above.  3. Atrial fibrillation - paroxysmal.  4. Sotalol for the above.  5. Diabetes.   Mr. Poulson is stable.  We will see him again in a year.     Duke Salvia, MD, [facility]  Electronically Signed    SCK/MedQ  DD: 01/16/2007  DT: 01/16/2007  Job #: [id number]   cc:   Colleen Can. Deborah Chalk, M.D.

## 2010-06-14 NOTE — Discharge Summary (Signed)
NAME:  [name], [name] NO.:  [id number]   MEDICAL RECORD NO.:  [id number]          PATIENT TYPE:  INP   LOCATION:  4712                         FACILITY:  [facility]   PHYSICIAN:  Colleen Can. Deborah Chalk, M.D.DATE OF BIRTH:  1931-11-13   DATE OF ADMISSION:  04/01/2007  DATE OF DISCHARGE:  04/04/2007                               DISCHARGE SUMMARY   DISCHARGE DIAGNOSES:  1. Polymorphic ventricular tachycardia which degenerated into      ventricular fibrillation, status post successful implantable      cardioverter-defibrillator discharge.  2. Previous history of 2-vessel coronary artery bypass grafting and      mitral valve repair at the [facility] in 2001.  3. Atrial fibrillation.  4. Intolerance to Coumadin anticoagulation.  5. Prior history of nonsustained polymorphic ventricular tachycardia      with an implantable cardioverter-defibrillator implanted in the      March 2004 (Guidant).  6. History of blue toe syndrome.  7. Diabetes.  8. Recent angiogram of the lower extremities by Dr. Waverly Ferrari, negative.  9. History of dyslipidemia.  10.Chronic antiarrhythmic therapy with sotalol.   HISTORY OF PRESENT ILLNESS:  [name] is a pleasant 77 year old white  male who has multiple medical problems.  He has known ischemic heart  disease.  Previous history of bypass surgery as well as mitral valve  repair up at the [facility] in 2001.  He has an ICD in place for  nonsustained exercise-induced polymorphic ventricular tachycardia.  He  presents to the hospital after having an ICD discharge that was  associated with frank syncope.  The patient had been in his usual state  of health and was outside in his driveway on a very snowy day and was  actually blowing snow off of the driveway with the snow blower.  His  wife was with him.  Suddenly, his wife noticed that he had fallen over  the snow blower and had fallen to the ground.  He turned blue.  She  noticed the defibrillator fired and then he began to wake up slowly.  EMS was alerted, and he was transported to the hospital.  He really has  no recollection of the episode.  He denied prior chest pain.  He was  subsequently seen in the emergency room and admitted for further  evaluation.   Please see dictated history and physical for further patient's  presentation and profile.   LABORATORY DATA:  Hemoglobin 14, hematocrit 41, white count was 5000,  platelets 214.  His chemistries were normal showing potassium 4.4, BUN  13, creatinine was 0.98.  All of his cardiac enzymes were negative, BNP  level was 463, TSH level was 0.986.   Chest x-ray showed stable examination with no acute findings.   HOSPITAL COURSE:  The patient was admitted electively, he was initially  admitted to the service of the Dr. Valera Castle, and then transferred to  the service of  Dr. Roger Shelter, his primary cardiologist.  He was  seen immediately in consultation by the electrophysiologist Dr. Lewayne Bunting who was covering  for Dr. Sherryl Manges.  The ICD was interrogated  and it was noted that the patient had a fair amount of ventricular  ectopy and PVC which resulted in the initiation of his polymorphous VT  which very quickly degenerated into ventricular fibrillation.  It was  hard to conclude whether this arrhythmia was initiated by the PVC or  just by the catecholamine surge from being up in the early morning and  exerting himself by using the snow blower.  However, he was thought to  have had adequate device therapy provided.  His laboratory data was  basically unremarkable.  It was felt that he would need to proceed on  with cardiac catheterization in order to rule out an ischemic trigger.  There has been this concern of blue toe syndrome, however, he has had a  recent negative angiogram.   The patient subsequently underwent cardiac catheterization on April 03, 2007, per  Dr. Roger Shelter.  This  demonstrated mild-to-moderate LV  dysfunction with anterior hypokinesia.  The ejection fraction was  approximately 40%.  There was 80% proximal LAD narrowing with 70% to 80%  obtuse marginal branch of the left circumflex with a patent radial  artery graft to the obtuse marginal.  The left internal mammary is  patent to the LAD.  There is a mild diffuse coronary disease, otherwise.  It was felt that in light of these findings that he had adequate  revascularization and that his arrhythmia was felt to not be ischemic in  nature.  He subsequently did have reprogramming of his ICD.  He was  watched overnight and the following day, he was felt to be a  satisfactory candidate for discharge.   Other procedures performed was a 2D echocardiogram on April 02, 2007,  showing an ejection fraction of 35% with dyssynergy of the entire septal  wall, mild mitral stenosis, moderate dilation of the left atrium.   DISCHARGE CONDITION:  Stable.   DISCHARGE MEDICATIONS:  Discharge medicines will be continued as he was  taking per his prior routine with no changes made.  This includes;  1. Actos 45 mg a day.  2. Aspirin 325 a day.  3. Januvia 100 mg a day.  4. Sotalol 160 twice a day.  5. Simvastatin 80 mg a day.  6. Allegra 60 mg a day.   ACTIVITY:  His activity is to be light without any heavy lifting.  He is  not allowed to drive for the following 6 months.  He is to use an ice  pack if needed to the groin for any problems with bruising.  We will  plan on seeing him back in the office in approximately 1-2 weeks' and he  is to call if any problems arise in the interim.      Sharlee Blew, N.P.      Colleen Can. Deborah Chalk, M.D.  Electronically Signed    LC/MEDQ  D:  04/17/2007  T:  04/18/2007  Job:  [id number]   cc:   Duke Salvia, MD, [facility]  Robert L. Foy Guadalajara, M.D.

## 2010-06-14 NOTE — Procedures (Signed)
DUPLEX ULTRASOUND OF ABDOMINAL AORTA   INDICATION:  Followup abdominal aortic aneurysm per arteriogram.   HISTORY:  Diabetes:  Yes.  Cardiac:  Yes.  Hypertension:  No.  Smoking:  No.  Connective Tissue Disorder:  Family History:  No.  Previous Surgery:  No.   DUPLEX EXAM:         AP (cm)                   TRANSVERSE (cm)  Proximal             2.89 cm                   2.66 cm  Mid                  3.2 cm                    3.37 cm  Distal               3.35 cm                   3.42 cm  Right Iliac          1.2 cm                    1.10 cm  Left Iliac           1.13 cm                   1.07 cm   PREVIOUS:  Date:  AP:  TRANSVERSE:   IMPRESSION:  Two tandem abdominal aortic aneurysms, mid-to-distal, with  no evidence of common iliac artery aneurysms.   ___________________________________________  [name], M.D.   AS/MEDQ  D:  04/09/2007  T:  04/09/2007  Job:  [id number]

## 2010-06-14 NOTE — Assessment & Plan Note (Signed)
OFFICE VISIT   LESLIE, LANGILLE  DOB:  03-20-1931                                       04/09/2007  KGMWN#:[id number]   I saw the patient in the office today for followup after his recent  arteriogram.  This is a pleasant 77 year old gentleman whom I have seen  in consultation with some bluish discoloration in his toes on  03/05/2007.  He underwent an arteriogram to rule out a proximal source  for atheroembolic disease or proximal aneurysm.  He had some slight  ectasia of the infrarenal aorta found on his exam and he comes in today  to rule out an abdominal aortic aneurysm.  Of note, the arteriogram  otherwise did not show any evidence of atherosclerotic disease, which  would be a cause of atheroembolic disease.  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   Of note, since I performed his arteriogram on 03/18/2007, approximately  a week ago, he did have a cardiac arrest and his defibrillator fired,  and he was successfully resuscitated.  He is followed closely by Dr.  Graciela Husbands and Dr. Deborah Chalk.   PHYSICAL EXAMINATION:  This is a pleasant 77 year old gentleman who  appears his stated age.  Blood pressure is 127/71, heart rate is 40 and  irregular.  Abdomen is soft and nontender.  I cannot palpate his  aneurysm.  He has palpable femoral pulses and a palpable left posterior  tibial pulse.  Both feet are warm and well-perfused.  He continues to  have some slight discoloration of his toes, but no ischemic ulcers.   A duplex scan of his aorta shows a small abdominal aortic aneurysm  measuring a maximum diameter of 3.4 cm.   With respect to his very small aneurysm, I have explained we generally  would not consider repair, unless the aneurysm measured 5.5 cm in  maximum diameter.  The aortogram showed that the flow  lumen was quite  smooth and I did not think this was a source for his discoloration of  his toes.  Again, we did not see any evidence of occlusive disease  either that would explain an atheroembolic event.   I would plan on seeing him back in 1 year with a followup duplex of his  small aneurysm.  I have encouraged him to simply stay as active as  possible.  Fortunately, he is not a smoker.  I will be happy to see him  back before his yearly visit if any vascular issues arise.   [name], M.D.  Electronically Signed   CSD/MEDQ  D:  04/09/2007  T:  04/10/2007  Job:  [id number]   cc:   Nadara Mustard, MD  Colleen Can. Deborah Chalk, M.D.  Robert L. Foy Guadalajara, M.D.  Duke Salvia, MD, [facility]

## 2010-06-14 NOTE — Consult Note (Signed)
VASCULAR SURGERY CONSULTATION   [name], [name]  DOB:  [birth year]                                       03/05/2007  JYNWG#:[id number]   I saw the patient in the office today in consultation concerning some  ischemic toes on both feet.  He was referred by Dr. Lajoyce Corners.  This is a  pleasant 77 year old gentleman who began noticing some bluish  discoloration of his toes approximately a year ago.  He does not  remember any acute onset of these symptoms or signs; however, recently,  he did notice that his feet were somewhat cool, and the bluish  discoloration in his toes was more prominent.  He was seen by Dr. Lajoyce Corners,  and it was felt that he had evidence of peripheral vascular disease with  ischemic changes in his toes, and he was sent for vascular consultation.  Of note, he denies any history of claudication, rest pain.  He has  developed a small ulcer on the tip of his right second toe, which is  being treated by Dr. Lajoyce Corners with Bactroban ointment and also with a  nitroglycerin patch on both feet.   His past medical history is significant for coronary artery disease.  He  had a coronary revascularization with a radial artery and mitral valve  repair at the [facility] in 2001.  He also has a history of atrial  fibrillation and nonsustained exercise-induced ventricular tachycardia.  He is followed by Dr. Graciela Husbands and has an AICD.  He denies any history of  hypertension, history of previous myocardial infarction, history of  congestive heart failure, or history of COPD.  He does, according to his  wife, have hypercholesterolemia.   FAMILY HISTORY:  There is no history of premature cardiovascular  disease.   SOCIAL HISTORY:  He is married.  He has one child.  He is retired.  He  quit tobacco in 1974.   Review of systems and medications are documented on the medical history  form in his chart.  Of note, he is allergic to Coumadin.   PHYSICAL EXAMINATION:  This is a  pleasant 77 year old gentleman who  appears his stated age.  Blood pressure is 118/80.  Heart rate is 78.  Neck is supple.  There is no cervical lymphadenopathy.  I do not detect  any carotid bruits.  Lungs are clear bilaterally to auscultation.  On  cardiac exam, he has a regular rate and rhythm.  Abdomen is soft and  nontender.  I cannot palpate an aneurysm.  He has palpable femoral and  popliteal pulses bilaterally.  On the right side, he has a palpable  dorsalis pedis and posterior tibial pulse.  On the left side, he has a  palpable posterior tibial pulse.  I cannot palpate a dorsalis pedis  pulse.  He does have biphasic Doppler signals in the dorsalis pedis and  posterior tibial positions bilaterally.  He has some bluish  discoloration most significantly in the right second toe with an ulcer  at the tip of the second toe.  He has a lesser degree of discoloration  on the toes of the left foot.   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  I think less likely this  would be embolic disease related to his atrial fibrillation,  as this  would more likely involve large vessels.   I have recommended that we proceed with arteriography to look for  potential source of embolization.  The risk, if this is ignored, is that  he could have continued problems with embolization and eventually  chronically occlude his tibial arteries.  If we find a proximal lesion  amenable to angioplasty, I have discussed addressing this at the same  time with angioplasty and stenting.  If no proximal source is found, it  appears that he does have adequate circulation to heal this wound in his  right second toe, and this could also possibly be simply related to  small vessel disease related to his diabetes.   His arteriogram has been scheduled for February 16th and will make  further recommendations pending these results.  Again, if he has  as  lesion amenable to angioplasty, this could be addressed at the same  time.  I have discussed the indications for the procedure and its  potential complications, including but not limited to bleeding, arterial  injury, arterial thrombosis, and dissection.  All of his questions were  answered, and he is agreeable to proceed.   [name], M.D.  Electronically Signed  CSD/MEDQ  D:  03/05/2007  T:  03/06/2007  Job:  [id number]   cc:   Nadara Mustard, MD  Doris Cheadle. Foy Guadalajara, M.D.  Duke Salvia, MD, [facility]  Colleen Can. Deborah Chalk, M.D.

## 2010-06-14 NOTE — Cardiovascular Report (Signed)
NAME:  Tony, [name] NO.:  [id number]   MEDICAL RECORD NO.:  [id number]          PATIENT TYPE:  INP   LOCATION:  4712                         FACILITY:  [facility]   PHYSICIAN:  Colleen Can. Deborah Chalk, M.D.DATE OF BIRTH:  1931-02-23   DATE OF PROCEDURE:  04/03/2007  DATE OF DISCHARGE:                            CARDIAC CATHETERIZATION   PROCEDURE:  Left heart catheterization with selective coronary  angiography, left ventricular angiography, angiography of the left  internal mammary artery and angiography of the free radial artery graft.   CONTRAST MATERIAL:  Omnipaque.   MEDICATIONS GIVEN PRIOR TO PROCEDURE:  Valium 10 mg p.o.   MEDICATIONS GIVEN DURING THE PROCEDURE:  Versed 2 mg IV.   COMMENT:  The patient tolerated the procedure well.   HEMODYNAMIC DATA:  The aortic pressure was 122/63, LV was 126/0-16.  There was no aortic valve gradient noted on pullback.   CORONARY ARTERIES:  Coronary arteries were calcified proximally.   1. Left main coronary artery:  Left main coronary artery had      calcification and mild distal narrowing.  2. Left circumflex:  The left circumflex had a 70-80% narrowing in the      first obtuse marginal.  There is bidirectional filling of that      vessel.  There were 3 posterior lateral branches, one of which      could be considered a posterior descending artery.  There are minor      irregularities but no obstructive flow.  3. Left anterior descending:  The left anterior descending had an 80%      proximal narrowing.  There was a bifurcation into a diagonal      vessel.  The left anterior descending itself had bidirectional      filling from a bypass graft.  4. Right coronary artery:  The right coronary artery was a nondominant      vessel.  It had minor irregularities but no significant focal      narrowing.  5. The radial artery graft to the obtuse marginal was patent.  It      arose out of the more proximal area of clips on the  aorta.  It gave      flow to this obtuse marginal branch.  6. Left internal mammary graft to the LAD is widely patent with a nice      insertion and good distal runoff.   Left ventricular angiogram showed anterior hypokinesis.  The global  ejection fraction was approximately 40%.  There was no significant  mitral regurgitation present.   OVERALL IMPRESSION:  1. Mild to moderate left ventricular dysfunction with anterior      hypokinesia.  2. Eighty percent proximal left anterior descending with 70-80% obtuse      marginal branch of the left circumflex with patent radial artery      graft to the obtuse marginal, patent left internal mammary artery      graft to the left anterior descending, with mild diffuse coronary      atherosclerosis otherwise.   DISCUSSION:  In light of these findings, it is  felt that [name] has  adequate revascularization.  It is felt that his arrhythmia is probably  not ischemic in nature.      Colleen Can. Deborah Chalk, M.D.  Electronically Signed     SNT/MEDQ  D:  04/03/2007  T:  04/04/2007  Job:  [id number]   cc:   Duke Salvia, MD, [facility]  Alfonse Alpers. Dagoberto Ligas, M.D.

## 2010-06-14 NOTE — Op Note (Signed)
NAME:  [name], [name] NO.:  [id number]   MEDICAL RECORD NO.:  [id number]          PATIENT TYPE:  AMB   LOCATION:  SDS                          FACILITY:  [facility]   PHYSICIAN:  [name], M.D.DATE OF BIRTH:  [birth year]   DATE OF PROCEDURE:  03/18/2007  DATE OF DISCHARGE:                               OPERATIVE REPORT   PREOPERATIVE DIAGNOSIS:  Atheroembolic disease, both feet.   POSTOPERATIVE DIAGNOSIS:  Atheroembolic disease, both feet.   PROCEDURE:  1. Ultrasound guided right femoral arterial access.  2. Aortogram.  3. Bilateral iliac arteriogram.  4. Selective left common iliac arteriogram.  5. Right lower extremity runoff.   SURGEON:  [name], M.D.   ANESTHESIA:  Local with sedation.   INDICATIONS:  This is a 77 year old gentleman who had noticed some  bluish discoloration in his toes approximately a year ago.  He does not  remember any acute onset of symptoms or signs.  He was later evaluated  by Dr. Lajoyce Corners and it was felt that he could potentially have atheroembolic  disease.  He was sent for vascular consultation.  On my exam, he had  palpable femoral and popliteal pulses bilaterally.  On the right side he  had palpable dorsalis pedis pulse and on the left side, a palpable  posterior tibial pulse.  He is noted have biphasic Doppler signals in  the dorsalis pedis and posterior tibial positions bilaterally.  He did  have some bluish discolorations most significantly in the right second  toe.  There was an ulcer on the tip of the right second toe.  We  recommended arteriography to look for a potential source of  embolization.   TECHNIQUE:  The patient was taken to the PV lab at [facility] and sedated with  a milligram of Versed and 50 mcg of fentanyl.  Both groins were prepped  and draped in usual sterile fashion.  Using ultrasound guidance the  right common femoral artery was cannulated and guidewire introduced into  the infrarenal  aorta under fluoroscopic control.  A 5-French sheath was  introduced over the wire and a pigtail catheter positioned at the L1  vertebral body.  Flush aortogram was obtained.  Oblique iliac  projections were obtained.   Next the pigtail catheter was exchanged for a IMA catheter which was  positioned into the left common iliac artery.  An angled Glidewire was  then advanced into the left superficial femoral artery.  The IMA  catheter was exchanged for an end-hole catheter which was advanced into  the distal left common iliac artery.  Selective left common iliac  arteriogram was obtained with left lower extremity runoff.  The end-hole  catheter was then removed and right lower extremity runoff films  obtained through the right femoral sheath.   FINDINGS:  There are single renal arteries bilaterally with no  significant renal artery stenosis identified.  There is some slight  ectasia of the infrarenal aorta well below the renals although this does  not appear frankly aneurysmal on this study.  This study, however,  cannot accurately rule out an aneurysm with  a laminated thrombus  present.  Both common iliac, external iliac and hypogastric arteries are  patent.  There is no obvious stenosis or source of embolization.   On the left side, the common femoral, superficial femoral, deep femoral,  popliteal and proximal tibial vessels were all widely patent with no  significant stenosis identified.  The patient had very low flow,  possibly related to a low ejection fraction.  There was therefore poor  visualization of his tibials distally but based on the exam, there was  no obvious source of embolic disease in the proximal tibial vessels.   On the left side, the common femoral, superficial femoral, deep femoral  and popliteal arteries are all widely patent.  The anterior tibial and  peroneal arteries are visualized with no significant stenosis noted.  The posterior tibial artery is not  visualized.   CONCLUSIONS:  1. No significant infrainguinal arterial occlusive disease bilaterally      with poor visualization distally because of low flow.  2. Slight ectasia of the infrarenal aorta which will be assessed with      an ultrasound scan.  No evidence of aortoiliac disease which would      explain an atheroembolic event.      [name], M.D.  Electronically Signed     CSD/MEDQ  D:  03/18/2007  T:  03/19/2007  Job:  [id number]   cc:   Nadara Mustard, MD  Doris Cheadle. Foy Guadalajara, M.D.  Duke Salvia, MD, [facility] [place]  Colleen Can. Deborah Chalk, M.D.

## 2010-06-17 NOTE — Cardiovascular Report (Signed)
[place]  Patient:    [name], [name]                     MRN: [id number] Proc. Date: 05/29/00 Adm. Date:  [id number] Disc. Date: [id number] Attending:  Eleanora Neighbor                        Cardiac Catheterization  PROCEDURE:  Left heart catheterization with selective coronary angiography, left ventricular angiography, angiography of the radial artery free graft and angiography of the left internal mammary artery graft.  INDICATIONS:  Mr. Geesey is a 77 year old diabetic with previous mitral valve repair and coronary artery bypass grafting x 2 in December.  He presents with recurrent exercise induced ventricular tachycardia during cardiac rehabilitation. This was confirmed by exercise testing.  He had a critical high lateral/high anterior defect that was small but in light of this it is felt that we needed to proceed on with catheterization due to confirm patency of grafts and bypassed area.  TYPE AND SITE OF ENTRY:  Percutaneous right femoral artery Per[place].  CONTRAST MATERIAL:  Omnipaque.  MEDICATIONS GIVEN PRIOR TO THE PROCEDURE:  Valium 10 mg p.o.  MEDICATIONS GIVEN DURING THE PROCEDURE:  Versed 2 mg IV and Ancef 1 g IV.  COMMENTS:  The patient tolerated the procedure well.  We used a Glidewire to allow [name] to enter the subclavian artery to find the internal mammary ostium. The radial artery free graft was entered using a left coronary artery bypass graft catheter.  HEMODYNAMIC DATA:  The aortic pressure was 103/42, LV was 98/11.  ANGIOGRAPHIC DATA:  1. The left main coronary artery is normal.  2. The left circumflex is a dominant vessel.  There are irregularities scattered.  There appears to be an 80-90% focal stenosis in a tortuous segment of a marginal vessel but that vessel does have competitive flow from the radial artery graft.  3. Left anterior descending is calcified proximally and there is sequential 70-80%  stenosis.  The more proximal one may be a 70-80% but the more distal was closer to 70%.  There appears to be satisfactory flow.  There is a large diagonal vessel but the flow into it appears to be satisfactory.  There is competitive flow into the distal left anterior descending.  4. Right coronary artery:  The right coronary artery is small, nondominant, without significant stenosis.  5. The left internal mammary artery graft to the LAD graft is an excellent graft with a nice insertion site.  There is competitive flow present.  6. The radial artery graft to the obtuse marginal is a small graft that has satisfactory flow.  It is appropriately matched for the vessel.  LEFT VENTRICULAR ANGIOGRAPHY:  Left ventricular angiogram was performed in the RAO projection.  The overall cardiac size and silhouette appear to be normal. There is a very mild barely anterior hypokinesis.  The apex on nonectopic beats pulls in well.  Inferior wall is normal.  There was no significant mitral regurgitation present.  There is no significant mitral regurgitation present.  The mitral valve ring is noted from the surgical repair.  OVERALL IMPRESSION: 1. Well preserved ventricular ejection fraction (50%) with mild anterior    hypokinesis. 2. Two vessel coronary disease (minimal irregularities in a nondominant    right coronary artery, 70+ stenosis in the proximal left anterior    descending, 80% focal stenosis in a small marginal vessel). 3. Patent  left internal mammary graft to the left anterior descending and a    patent radial artery graft to the obtuse marginal system.  DISCUSSION:  It does not appear that there is a real area that one would think would be ischemic to be triggering his exercise induced ventricular tachycardia.  He does have relatively low systolic blood pressures which will make use of beta blockers more difficult.  We will try to manage him medically. DD:  05/29/00 TD:  05/29/00 Job:  [id number]

## 2010-06-17 NOTE — H&P (Signed)
NAME:  [name], VORA NO.:  [id number]   MEDICAL RECORD NO.:  [id number]            PATIENT TYPE:   LOCATION:                                 FACILITY:   PHYSICIAN:  Colleen Can. Deborah Chalk, M.D.    DATE OF BIRTH:   DATE OF ADMISSION:  02/06/2006  DATE OF DISCHARGE:                              HISTORY & PHYSICAL   Sharlee Blew, N.P. dictating for [employer]. Deborah Chalk, M.D.   CHIEF COMPLAINT:  Worsening dyspnea on exertion.   HISTORY OF PRESENT ILLNESS:  The patient is a 77 year old white male who  has multiple medical problems.  He was seen for his routine office visit  towards the early part of December of 2007.  At that time he was  complaining of worsening dyspnea on exertion.  He has also had some mild  chest pain with strenuous activity.  He has not really been able to  exercise since August of 2007 when he had repair of his right knee.  He  has basically stopped walking on the treadmill since that point in time.  He has had history of recurrent episodes of atrial fibrillation however  he is intolerant to Coumadin which causes exacerbation of migraines.  He  does have an ICD in place.  He was referred for adenosine Cardiolite  study to rule out ischemia and this was performed on January 05, 2006.  He had an adenosine Cardiolite study which showed a reversible anterior  profusion defect that was consistent with angio ischemia.  Ejection  fraction was 45%.  He was subsequently referred for cardiac  catheterization after the holidays.   PAST MEDICAL HISTORY:  1. Arthrosclerotic cardiovascular disease.  He has had previous      history of mitral valve repair, patent foramen of ovale closure and      coronary bypass grafting x2 at the [facility] in December of      2001.  At that time he had left internal mammary to the LAD with a      right radial graft to the marginal branch of the circumflex.  His      last catheterization was in December of 2004 which  showed mild LV      dysfunction with trace MR mild anterior hypokinesis, patent left      internal mammary graft to the LAD and patent radial artery to the      left circumflex.  He had moderate disease otherwise.  2. LV dysfunction.  His current ejection fraction is 45%.  His last 2-      D echocardiogram was in June of 2006 which showed an EF of 45-50%      with mild tricuspid regurgitation, trace mitral regurgitation, mild      right atrial enlargement, diastolic dysfunction and mild LV      systolic dysfunction.  3. Chronic PVCs, he was loaded with sotalol in 2006 and has been      maintained on chronic sotalol use.  4. History of mitral valve prolapse since childhood.  5. Previous past history of mitral valve  endocarditis.  6. History of ventricular tachycardia.  He has an ICD in place since      March of 2004 with a dual-chamber Guidant Prizm 2, 1861, unit.  7. Diabetes, noninsulin dependent.  8. Hyperlipidemia.  9. Past history of pleural effusion requiring thoracentesis.  10.Paroxysmal atrial fibrillation with intolerance to Coumadin.  11.History of migraine headaches.  12.Seasonal allergies.   ALLERGIES:  COUMADIN.   MEDICATIONS:  Sotalol 160 b.i.d.  Lipitor 80 a day.  Actos 45 a day.  Januvia 100 mg a day.  Metformin 500 b.i.d.  Aspirin daily.  Allegra 60 mg a day.  Multivitamin daily.   FAMILY HISTORY:  Father died of emphysema at age 87.  Mother lived up  into her 16s and had history of stroke and arthritis.   SOCIAL HISTORY:  He is married, he has no tobacco use, he has occasional  social alcohol use.   REVIEW OF SYSTEMS:  As noted above.  He was seen by Dr. Graciela Husbands  approximately 2 weeks ago and had reprogramming for timing issues.  He  has had no recent ICD discharges.  His chest pain and shortness of  breath are as described above.   PHYSICAL EXAMINATION:  GENERAL:  He is a pleasant, elderly white male in  no acute distress.  VITAL SIGNS:  His weight is  220-1/2 pounds.  Blood pressure 98/60  sitting, 100/64 standing.  Heart rate is 62.  LUNGS:  Clear.  HEART:  Shows a regular rhythm.  ABDOMEN:  Soft, positive bowel sounds.  Nontender.  EXTREMITIES:  Without edema.  NEUROLOGIC:  Shows no gross focal deficits.   LABORATORY DATA:  Laboratory data is pending.   OVERALL IMPRESSION:  1. Abnormal adenosine Cardiolite study.  2. Known atherosclerotic cardiovascular disease with previous history      of coronary artery bypass grafting x2 in 2001.  3. History of mitral valve repair in 2001.  4. 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  5. Diabetes.  6. History of paroxysmal atrial fibrillation.  7. Coumadin intolerance.  8. Functioning implantable cardioverter defibrillator (ICD) with      recent programming.   PLAN:  Will proceed with attempts at cardiac catheterization and  procedure risk and benefits have all been explained.  The patient is  willing to proceed on Tuesday February 06, 2006.     Colleen Can. Deborah Chalk, M.D.  Electronically Signed    SNT/MEDQ  D:  02/05/2006  T:  02/06/2006  Job:  [id number]   cc:   Colleen Can. Deborah Chalk, M.D.

## 2010-06-17 NOTE — H&P (Signed)
[place]. [facility]  Patient:    [name], [name]                     MRN: [id number] Adm. Date:  [id number] Disc. Date: [id number] Attending:  [place], Peter Manning Dictator:   Jennet Maduro. Earl Gala, R.N., A.N.P. CC:         Alfonse Alpers. Dagoberto Ligas, M.D.             Thomas A. Patty Sermons, M.D.                         History and Physical  CHIEF COMPLAINT:  Dizziness associated with nausea, sweating, and blurred vision with generalized malaise and fatigue.  HISTORY OF PRESENT ILLNESS:  Mr. Schrecengost is a pleasant 77 year old white male who has had known history of mitral valve prolapse dating back to childhood. He has had previously subacute bacterial endocarditis which responded to medical therapy.  He has been followed on an annual basis by Dr. Ronny Flurry.  He was seen in the office by Dr. Patty Sermons on October 12, 1999, and at that time, his wife who accompanied him related a miraud of symptoms. He has been having episodes of dizziness that have been associated with nausea, sweating, and some slightly blurred vision that would last anywhere from minutes to hours.  The episodes sometimes come on with a change in position such as working under a car and then resuming an upright position, etc.  He has had a negative recent ENT evaluation.  He has had some generalized malaise and fatigue.  He was referred on for two-dimensional echocardiogram which basically improved from the previous one done at [facility] in August of 2000.  His left ventricular end diastolic diameter has decreased from 6.7 to 6.2.  His left atrial size remains the same at 6.0.  The mitral valve showed thickening and calcification consistent with old vegetation and he does have mitral valve prolapse with 3+ mitral reguritation.  The ejection fraction was 6 to 65%.  He was also referred for stress Cardiolite study which was performed on October 14, 1999.  There was no evidence of  reversible ischemia, there was no evidence of infarction.  The left ventricle was dilated both at rest as well as with exercise.  The ejection fraction was normal at 57%.  In general, the study showed no evidence of reversible ischemia to suggest coronary artery disease, however, it did show evidence of marked cardiomegaly with marked left ventricular dilatation, but preserved LV function.  The stress portion of the study also demonstrated multiple runs of ventricular tachycardia up to 6 and 7 beats at a time that occurred during exercise and during the recovery phase.  Plans are now made for the patient to undergo left and right heart catheterization with plans for possible mitral valve replacement.  PAST MEDICAL HISTORY: 1. 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 2. Prior history of dizziness with negative MRI, MRA in 2000. 3. Prior Coumadin therapy. 4. Diabetes, currently on oral agents. 5. Dyslipidemia with reportedly high triglyceride levels and elevated total    cholesterol.  ALLERGIES:  No known drug allergies.  MEDICATIONS: 1. Actos 45 mg a day. 2. Meclozine p.r.n. 3. Allegra 60 mg a day. 4. Lipitor 40 mg q.d. 5. Diabeta 5 mg a day.  FAMILY HISTORY:  Father died of emphysema  at the age of 36.  Mother is alive at the age of 15 with a prior history of stroke and generalized arthritis.  SOCIAL HISTORY:  There is no tobacco.  He does engage in occasional alcohol use.  He is married.  He is a Engineer, production.  REVIEW OF SYSTEMS:  Basically as noted above.  He does not feel that he has had anymore shortness of breath than he has previously had over the course of the past year.  There has been no complaints of chest pain and no angina.  He is no longer on Coumadin and apparently stopped that on his own.  PHYSICAL EXAMINATION:  GENERAL:  He is a pleasant, elderly male in  no acute distress.  VITAL SIGNS:  Blood pressure 100/60 sitting, 110/80 standing, heart rate 68 and regular, respirations 18, he is afebrile.  SKIN:  Warm and dry.  Color is unremarkable, yet, somewhat with a generalized palor.  HEENT:  Unremarkable.  Carotids are normal.  CHEST:  Clear.  HEART:  Shows a regular rhythm.  There is a harsh grade 3/6 murmur with mitral reguritation.  ABDOMEN:  Soft, positive bowel sounds, and nontender.  EXTREMITIES:  Showed no evidence of edema.  NEUROLOGICAL:  Intact.  There are no gross focal deficits.  LABORATORY DATA:  Currently pending.  IMPRESSION: 1. Complaints of dizziness associated with nausea, sweating, and blurred    vision with generalized malaise and fatigue. 2. Abnormal stress Cardiolite study revealing marked left ventricular    dilatation yet with preserved left ventricular function. 3. Known mitral valve prolapse with 3+ mitral reguritation per two-dimensional    echocardiogram. 4. Documented multiple runs of ventricular tachycardia. 5. Diabetes. 6. Previous subacute bacterial endocarditis in 1995.  PLAN:  Will proceed on with left and right heart catheterization.  The procedure is discussed in detail with both he and his wife and he is willing to proceed. DD:  10/24/99 TD:  10/24/99 Job: [id number]

## 2010-06-17 NOTE — H&P (Signed)
[place]. [facility]  Patient:    [name], [name]                       MRN: [id number] Adm. Date:  05/28/00 Attending:  Colleen Can. Deborah Chalk, M.D. Dictator:   Jennet Maduro. Earl Gala, R.N., A.N.P.-C CC:         Alfonse Alpers. Dagoberto Ligas, M.D.  Nathen May, M.D., [facility]   History and Physical  CHIEF COMPLAINT:  None.  HISTORY OF PRESENT ILLNESS:  Mr. Dorsch is a pleasant 77 year old white male who has had recent coronary artery bypass grafting as well as mitral valve repair at [facility].  These procedures were performed on January 04, 2000.  His post hospitalization was basically uneventful except for a recurrent pleural effusion which required multiple thoracenteses.  He did have cardioversion for atrial fibrillation just prior to discharge, as well.  He has been active in cardiac rehab where he has progressed quite nicely.  He has continued, however, to exhibit nonsustained runs of exercise-induced ventricular tachycardia.  It promptly resolves once the heart rate is less than about 95 beats per minute.  He was referred on for an adenopathy Cardiolite to rule out or possibly even confirm the presence of ischemia.  He has been maintained on low-dose beta blocker therapy.  Adenosine Cardiolite was performed last week, which is abnormal.  There is now concern for a possible area of insufficient revascularization.   In light of these findings, as well as his continued exercise-induced ventricular tachycardia, he is referred on for elective coronary angiography.  He has not had chest pain.  He has had no lightheadedness, no dizziness.  PAST MEDICAL HISTORY: 1. Known mitral valve prolapse since childhood with the development of    Streptococcus viridans endocarditis in 1995 with subsequent mitral valve    repair December 2001 at [facility]. 2. Coronary artery bypass grafting at [facility] on January 04, 2000    with left internal mammary to  the LAD and a right radial conduit to the    marginal branch of the circumflex. 3. Prior history of dizziness with negative MRI/MRA in 2000. 4. Diabetes on oral agents, followed by Dr. Corrin Parker. 5. Dislipidemia. 6. Left pleural effusion requiring multiple ultrasound-guided thoracenteses,    currently stable.  ALLERGIES:  None known.  CURRENT MEDICATIONS: 1. Starlix one tablet before meals. 2. Toprol-XL 25 mg a day. 3. Multivitamin daily. 4. Ecotrin daily. 5. Actos 45 mg daily. 6. Allegra 60 mg daily. 7. Lipitor 40 q.d.  FAMILY HISTORY:  Unchanged.  SOCIAL HISTORY:  Unchanged.  REVIEW OF SYSTEMS:  Is otherwise as stated above and otherwise unremarkable.  PHYSICAL EXAMINATION:  GENERAL:  He is a very pleasant white male in no acute distress.  He appears younger than his stated age.  VITAL SIGNS:  Blood pressure 110/70 sitting, 100/70 standing.  Heart rate 56, respirations 18.  He is afebrile.  Weight is 203 pounds.  SKIN:  Warm and dry.  Color is unremarkable.  NECK:  Supple.  LUNGS:  Basically clear.  CARDIAC:  Shows a regular rhythm.  ABDOMEN:  Soft.  His extremities show no evidence of edema.  LABORATORY DATA:  Currently pending.  IMPRESSION: 1. Abnormal adenosine Cardiolite in the setting of exercise-induced    ventricular tachycardia (nonsustained). 2. Recent coronary artery bypass grafting x 2 as well as mitral valve repair    in December 2001.  PLAN:  Will proceed on with elective coronary  angiography in order to confirm the presence or absence of ischemia.  We will continue him on his beta blockers for now.  DD:  05/28/00 TD:  05/28/00 Job: [id number]

## 2010-06-17 NOTE — H&P (Signed)
NAME:  [name], [name]                        ACCOUNT NO.:  [id number]   MEDICAL RECORD NO.:  [id number]                   PATIENT TYPE:  OIB   LOCATION:  NA                                   FACILITY:  [facility]   PHYSICIAN:  Colleen Can. Deborah Chalk, M.D.            DATE OF BIRTH:  [birth year]   DATE OF ADMISSION:  01/06/2003  DATE OF DISCHARGE:                                HISTORY & PHYSICAL   CHIEF COMPLAINT:  Exertional chest pain.   HISTORY OF PRESENT ILLNESS:  [name] is a 77 year old white male who has  multiple medical problems. He has had previous coronary artery bypass  grafting with mitral valve repair in March of 2004. Since that time, he has  had persistent exercise-induced tachycardia. He has subsequently had AICD  implantation earlier this year as well. He has been managed on beta blocker  with some improvement; however, with exercise, he has now began to have  clear cut exertional chest discomfort that is substernal in location. It is  consistent with his previous chest pain syndrome. He is subsequently  referred for cardiac catheterization.   PAST MEDICAL HISTORY:  1. Atherosclerotic cardiovascular disease with previous history of mitral     valve repair, patent foramen ovale closure, and coronary artery bypass     grafting x2 at [facility] in December of 2001.  2. History of mitral valve prolapse since childhood.  3. Previous history of mitral valve endocarditis.  4. History of ventricular tachycardia, currently with AICD in place followed     by [facility].  5. Diabetes.  6. Hyperlipidemia.  7. Previous history pleural effusion requiring thoracentesis.   ALLERGIES:  None known.   CURRENT MEDICATIONS:  1. Ocuvite daily.  2. Multivitamin daily.  3. Baby aspirin daily.  4. Lipitor 40 q.d.  5. Allegra daily.  6. Glucotrol XL 5 mg a day.  7. Actos 45 mg a day.  8. Acebutelol 200 mg b.i.d.   FAMILY HISTORY:  Father died of emphysema at age 15. Mother  lived up into  her 59s and has had a history of stroke and arthritis.   SOCIAL HISTORY:  He is married. He has no tobacco. He does have occasional  alcohol use.   REVIEW OF SYSTEMS:  Basically as noted above. He has had no AICD discharges.  No recent fever, flu, or cough. His chest pain syndrome is as described  above. He has had no syncope. Otherwise review of systems is as stated above  and otherwise unremarkable.   PHYSICAL EXAMINATION:  VITAL SIGNS:  His weight is 208 pounds, blood  pressure 110/70 sitting-104/70 standing, heart rate 60 and regular.  HEENT:  Unremarkable.  NECK:  Supple, no bruits.  CARDIAC:  Regular rhythm. There is no murmur. S1 and S2 are normal.  LUNGS:  Basically clear.  ABDOMEN:  Soft, positive bowel sounds, nontender.  EXTREMITIES:  Without edema.  NEUROLOGICAL:  Intact.  There is no gross focal deficit.   LABORATORY DATA:  Pending.   OVERALL IMPRESSION:  1. Recurrent exertional chest pain.  2. Known atherosclerotic vascular disease with previous coronary artery     bypass grafting x2 with mitral valve repair in December of 2001 with left     internal mammary to the LAD and the right radial arterial conduit to the     marginal branch of the circumflex.  3. Diabetes.  4. Hyperlipidemia.  5. Previous history of exercise-induced tachycardia, currently with AICD in     place.   PLAN:  Will proceed on with repeat arteriograms. The procedure has been  reviewed in full detail, and he is willing to proceed today on Tuesday,  January 06, 2003.      Juanell Fairly C. Earl Gala, N.P.                 Colleen Can. Deborah Chalk, M.D.    LCO/MEDQ  D:  01/06/2003  T:  01/06/2003  Job:  [id number]   cc:   Duke Salvia, M.D.   Alfonse Alpers. Dagoberto Ligas, M.D.  [address], [address]  [place]  [place] [address]  Fax: [phone]

## 2010-06-17 NOTE — Op Note (Signed)
NAME:  [name], [name] NO.:  [id number]   MEDICAL RECORD NO.:  [id number]                   PATIENT TYPE:  OIB   LOCATION:  2855                                 FACILITY:  [facility]   PHYSICIAN:  Duke Salvia, M.D. [facility]           DATE OF BIRTH:  12-15-1931   DATE OF PROCEDURE:  04/08/2002  DATE OF DISCHARGE:                                 OPERATIVE REPORT   PREOPERATIVE DIAGNOSIS:  Polymorphic ventricular tachycardia associated with  exercise; syncope.  Status post mitral valve repair.   POSTOPERATIVE DIAGNOSIS:  Polymorphic ventricular tachycardia associated  with exercise; syncope.  Status post mitral valve repair.   OPERATION:  Dual chamber defibrillator implantation with intraoperative  defibrillation threshold testing.   DESCRIPTION OF PROCEDURE:  Following the obtainment of informed consent, the  patient was brought to the Electrophysiology Laboratory and placed on the  fluoroscopic table in the supine position.  After routine prep and drape of  the left upper chest, intravenous contrast was injected via the left  antecubital vein to identify the course and the patency of the extrathoracic  subclavian vein.  This having been accomplished, Lidocaine was infiltrated  in the prepectoral subclavicular region.  An incision was made and carried  down to the layer of the prepectoral fascia using electrocautery and sharp  dissection.  A pocket was formed similarly.  Hemostasis was obtained.   Thereafter attention was turned to gain access to the extrathoracic left  subclavian vein which was accomplished without difficulty without the  aspiration of air or puncture of the artery.  Two separate venipunctures  were accomplished.  Two guide wires were placed and retained with 0 silk  suture an allowed to hang loosely.   Subsequently 9 [place] and 7 [place] tear away introducer sheaths were placed.  Through which were passed respectively a Guyton model  [id number] dual coil active  fixation defibrillator lead, serial [id number] and a [employer]tronics 50/76 52 cm  active fixation atrial lead, serial #[id number] Vicryl.  Under fluoroscopic  guidance, they manipulated to the right ventricular apex and the right  atrial appendage respectively with a bipolar R wave of 9.4 mV with an  impedance of 640 ohms, and pacing threshold of 0.5 msec at 0.9 volts,  currented threshold was 1.5 milliamps and there was no diaphragmatic pacing  at 10 volts.  The bipolar P wave was 4.5 mV with an impedance of 580 ohms,  and pacing threshold of 1 volt at 0.5 msec with a currented threshold at 1.9  milliamps.   With these acceptable parameters recorded, the leads were then attached to a  Guyton prism II 1861 dual chamber defibrillator serial [id number].  Through  this device the bipolar P wave was 1.9 mV with a pacing impedance of 666 and  pacing threshold of 0.8 volts at 0.5 msec and the R wave was 9.0 mV with a  pacing impedance of 518 ohms and  a pacing threshold of 0.6 volts at 0.5  msec.  The impedance was 38 ohms.   With these acceptable parameters recorded, defibrillation threshold testing  was undertaken.   Fibrillation was induced using T wave shock.  After a total duration of 7.5  seconds an 11 joule shock was delivered to a measured resistance of 39 ohms  terminating  fibrillation and restoring sinus rhythm.   After a wait of 5 to 6 minutes ventricular fibrillation was reinduced via  the T wave shock.  After controlled duration of 7.5 seconds an 11 joule  shock was delivered to a measured distance of 39 ohms terminating  ventricular fibrillation and restoring sinus rhythm.   The first shock was undertaken at least sensitivity and the second shock  nominal sensitivity.   With these acceptable parameters recorded, the system was implanted.   The pocket was copiously irrigated with antibiotic containing saline  solution.  Hemostasis was assured.  The leads and  the pulse generator were  then placed in the pocket, secured to the prepectoral fascia.  The wound was  then closed in three layers in a normal fashion.  The wound was washed and  dried.  A Benzoin Steri-Strips dressing was applied.  Needle counts, sponge  counts and instrument counts were correct at the end of the procedure  according to the staff.   The patient tolerated the procedure without apparent complications.                                               Duke Salvia, M.D. [facility]    SCK/MEDQ  D:  04/08/2002  T:  04/08/2002  Job:  [id number]   cc:   Electrophysiology Laboratory   Colleen Can. Deborah Chalk, M.D.  [address], [address]  [place]  [place] [address]  Fax: [phone]   [place] [facility], Attn:  [facility]

## 2010-06-17 NOTE — Discharge Summary (Signed)
   NAME:  [name], [name] NO.:  [id number]   MEDICAL RECORD NO.:  [id number]                   PATIENT TYPE:  OIB   LOCATION:  2003                                 FACILITY:  [facility]   PHYSICIAN:  Duke Salvia, M.D. [facility]           DATE OF BIRTH:  07-28-1931   DATE OF ADMISSION:  04/08/2002  DATE OF DISCHARGE:  04/09/2002                                 DISCHARGE SUMMARY   PRIMARY DIAGNOSIS:  Polymorphic ventricular tachycardia with syncope.   HISTORY OF PRESENT ILLNESS:  This is a 77 year old gentleman with a known  mitral valve prolapse, mitral valve repair, postop atrial fibrillation,  status post D-C cardioversion with no reoccurrence of atrial fibrillation,  who has been having episodes of exercise-induced ventricular tachycardia  with heart rates exceeding 100 beats per minute.   HOSPITAL COURSE:  The patient was admitted and underwent placement of a  Guidant ICD.  He tolerated the procedure well, had no immediate postop  complications.  ICD is a dual-chamber Guidant ICD.  The patient had no  complications postoperatively and was discharged to home in stable  condition.  On April 09, 2002, the patient did complain of some  lightheadedness and diaphoresis when standing up.  He felt somewhat better  after a while.  His Lopressor was discontinued.  The patient was placed on  acebutolol.  He was discharged to home later that day in stable condition  on:   DISCHARGE MEDICATIONS:  1. Acebutolol 200 mg b.i.d.  2. Actos 45 mg daily.  3. Allegra 60 mg daily.  4. Lipitor 40 mg nightly.  5. Aspirin 81 mg daily.  6. Multivitamin one daily.  7. Glucotrol 5 mg daily.  8. He was to take Tylenol one to two tabs every four to six hours as needed.   ACTIVITY AND WOUND CARE:  Activity and wound care were per pacemaker  discharge sheet.   DIET:  Low-fat, low-salt, low-cholesterol diet.    FOLLOWUP:  The patient was to call and schedule an appointment with  Dr.  Colleen Can. [place] in 10 to 14 days.  An appointment was scheduled for Dr.  Duke Salvia on May 28th at 4 p.m.     Chinita Pester, C.R.N.P. [facility]                 Duke Salvia, M.D. [facility]    DS/MEDQ  D:  04/09/2002  T:  04/10/2002  Job:  [id number]   cc:   [facility]   Colleen Can. Deborah Chalk, M.D.  [address], [address]  [place]  [place] [address]  Fax: [phone]

## 2010-06-17 NOTE — Assessment & Plan Note (Signed)
[facility]                         ELECTROPHYSIOLOGY OFFICE NOTE   NAME:[name], [name]                       MRN:          [id number]  DATE:01/26/2006                            DOB:          [birth year]    [name] is seen today at the request of Dr. Deborah Chalk.  Dr.  Deborah Chalk had gotten a Holter monitor and had been concerned about  ventricular undersensing and inappropriate ventricular pacing spikes.  Review of that Holter monitor suggests that there is extensive atrial  blanking during his PVCs, and this resulted in ventricular pacing and  really at places quite close to the apex of the T-wave.   His other major complaint is chest pain and dyspnea on exertion.  He had  a nuclear scan that was abnormal.  He is supposed to see Dr. Deborah Chalk the  week after next to consider catheterization.   His other medications are reviewed.   EXAMINATION:  His blood pressure is 122/71.  Pulse was 64.  LUNGS:  Clear.  Heart sounds were regular.   Interrogation of his device demonstrated a normal device function with a  T-wave of 4.8 and an impedence of 458 and a threshold of 0.6 which is  also the V threshold.  The impedence was 598 and the R-wave was 14.8.  Battery voltage was 2.64 and the device was reprogrammed with V blanking  after A pace 245 msec.   We will plan to see him again in 1 year's time.  He will be followed  remotely by [facility] in the interim.     Duke Salvia, MD, [facility]  Electronically Signed    SCK/MedQ  DD: 01/26/2006  DT: 01/26/2006  Job #: [id number]   cc:   Colleen Can. Deborah Chalk, M.D.

## 2010-06-17 NOTE — Procedures (Signed)
[place]. [facility]  Patient:    [name], [name]                     MRN: [id number] Proc. Date: 10/31/99 Adm. Date:  [id number] Attending:  [place], Peter Manning CC:         Colleen Can. Deborah Chalk, M.D.  Thomas A. Patty Sermons, M.D.   Procedure Report  PROCEDURE:  Transesophageal echocardiogram.  INDICATION FOR PROCEDURE:  Patient is a 77 year old white male with mitral valve prolapse and severe mitral insufficiency who is being evaluated for mitral valve surgery.  PROCEDURE:  Patient was sedated with 50 mg of IV Demerol and 2 mg of IV Versed.  Transesophageal scope was passed easily through the esophagus and adequate images were obtained.  FINDINGS:  The left ventricle appears normal in size, wall thickness, and contractility, with normal systolic function.  The left atrium appears mildly enlarged.  The right ventricle appears normal in size and function.  The right atrium appears normal.  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  There is moderate calcification at the tip of the anterior mitral valve leaflet.  There is also moderate calcification of the posterior annulus.  There is moderate prolapse in the anterior leaflet.  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  By Doppler, there is moderate to severe mitral insufficiency with a broad-based jet directed anteriorly and wrapping around the left atrium.  Interrogation of the superior pulmonary vein demonstrates some reversal of flow.  The left atrial appendage appears normal.  The aortic valve is tri-leaflet and appears normal.  The tricuspid valve is pliable without prolapse.  There is mild tricuspid insufficiency.  The pulmonic valve was not well seen but appears normal.  The interatrial septum appears intact.  There appears to be membranous reflection from the interatrial septum to the atrial wall.  No shunt  is seen. No pericardial effusion is seen.  The proximal aorta appears normal.  The descending aorta is normal in size without significant atherosclerosis.  FINAL INTERPRETATION: 1. 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 2. Moderate to severe mitral insufficiency. 3. Moderate posterior annular calcification.  Moderate calcification at the    tip of the anterior mitral valve leaflet. 4. Normal left ventricular function. 5. Mild left atrial enlargement. 6. Normal tricuspid and aortic valve appearance with mild tricuspid    insufficiency. DD:  10/31/99 TD:  10/31/99 Job: [id number]

## 2010-06-17 NOTE — H&P (Signed)
NAME:  [name], [name] NO.:  [id number]   MEDICAL RECORD NO.:  [id number]                   PATIENT TYPE:  OIB   LOCATION:  2003                                 FACILITY:  [facility]   PHYSICIAN:  Duke Salvia, M.D. [facility]           DATE OF BIRTH:  24-Apr-1931   DATE OF ADMISSION:  04/08/2002  DATE OF DISCHARGE:                                HISTORY & PHYSICAL   PRIMARY DIAGNOSES:  Nonsustained ventricular tachycardia, syncope in 2000.   SECONDARY DIAGNOSES:  1. Mitral valve prolapse with flail mitral valve, status post mitral valve     repair, with a coronary artery bypass graft surgery x2, and a patent     foramen ovale closure on January 04, 2000.  2. Bacterial endocarditis in January  1994.  3. Syncope in 2000.  4. Nonsustained ventricular tachycardia, exercise-induced polymorphic and     monomorphic preoperative stress test with nonsustained ventricular     tachycardia in 2001.  5. Nonsustained ventricular tachycardia during cardiac rehabilitation on     April 18, 2000.  6. Atrial fibrillation postoperatively in December 2001, with direct current     cardioversion.  7. Diabetes mellitus.  8. Hyperlipidemia.  9. Hemorrhoidectomy.  10.      Venous stasis.   HISTORY OF PRESENT ILLNESS:  This is a 77 year old gentleman who has  exercise-induced nonsustained ventricular tachycardia preoperatively and  postoperatively, syncope in 2000, after working and getting his engine out  of the car.  He went upstairs after a Scotch, and had syncope.  No  palpitations.  He was out for less than one minute, but with associated  damage of his glasses and a head trauma.  Subsequently he developed  exertional dyspnea and dizziness.  He was found to have flail mitral valve  during evaluation and has shown exercised-induced nonsustained ventricular  tachycardia.  Subsequently he underwent mitral valve repair.  He had  postoperative atrial fibrillation after his open  heart surgery, requiring a  direct current cardioversion, which has not recurred.  He then had induced  ventricular exercise-induced ventricular tachycardia documented during the  cardiac rehabilitation.  Since then he has had several exercise tests, which  nonsustained ventricular tachycardia has been induced, with his heart rates  exceeding 100.  He has since avoided a heart rate greater than 100 during  exercise.  In May 2003, he noted an increase in symptoms, mild progressive  dyspnea on exertion and shortness of breath after walking down his driveway.  He had chest tightness with exertion which he states is predictable.  His  symptoms were relieved by rest.   PAST MEDICAL/SURGICAL HISTORY:  As stated above.   ALLERGIES:  No known drug allergies.   FAMILY HISTORY:  Father died at age 76, of emphysema.  Mother lived until  age 57, had a CVA in her 47s.  Brothers are healthy.  No sudden death in the  family.  SOCIAL HISTORY:  He is retired from Doctor, general practice, and is married with two  children.  He lives in [place].  He has a 15-20-pack-year-history.  He  quit in 1973.  Alcohol:  A glass of wine every other day.  No caffeine or  herbal supplements.  He walks on a treadmill daily for 30 minutes.   REVIEW OF SYSTEMS:  Unremarkable.   MEDICATIONS:  1. Actos 45 mg daily.  2. Glucotrol XL 5 mg daily.  3. Allegra 60 mg daily.  4. Lipitor 40 mg daily.  5. Centrum Silver daily.   PHYSICAL EXAMINATION:  GENERAL:  This is a well-developed 77 year old  gentleman in no apparent distress.  HEENT:  Sclerae clear.  NECK:  Supple, no masses or bruits.  CHEST:  Lungs clear to auscultation bilaterally.  HEART:  A regular rhythm.  No jugular venous distention.  No carotid bruits.  ABDOMEN:  Soft, round, nontender.  Normoactive bowel sounds.  EXTREMITIES:  No clubbing, cyanosis, or edema.  NEUROLOGIC:  Awake, alert, oriented x 3.   ASSESSMENT:  1. Ventricular tachycardia.  2. Mitral  valve prolapse, status post repair.   PLAN:  For an ICD.       Chinita Pester, C.R.N.P. [facility]                 Duke Salvia, M.D. [facility]    DS/MEDQ  D:  04/09/2002  T:  04/09/2002  Job:  [id number]

## 2010-06-17 NOTE — Cardiovascular Report (Signed)
NAME:  ARREN, LAMINACK NO.:  [id number]   MEDICAL RECORD NO.:  [id number]                   PATIENT TYPE:  OIB   LOCATION:  2874                                 FACILITY:  [facility]   PHYSICIAN:  Colleen Can. Deborah Chalk, M.D.            DATE OF BIRTH:  [birth year]   DATE OF PROCEDURE:  01/06/2003  DATE OF DISCHARGE:                              CARDIAC CATHETERIZATION   HISTORY:  [name] has had previous coronary artery bypass grafting and  mitral valve repair.  He had persistent exercise induced tachycardia and had  an ICD implanted in March of 2004.  He has basically done well since that  time.  He had original coronary artery bypass grafting in December of 2001  with a mitral valve repair, patent foramen ovale closure, and a coronary  artery bypass grafting x2 with a left internal mammary artery graft to the  LAD and a radial artery graft to the obtuse marginal branch of the left  circumflex.   PROCEDURE:  Left heart catheterization with selective coronary angiography,  left ventricular angiography, angiography of the radial artery free graft to  the left circumflex, and angiography to the left internal mammary artery  graft to the left anterior descending.   TYPE AND SITE OF ENTRY:  Percutaneous, right femoral artery with Perclose.   CATHETERS:  6-French 4 curved Judkins right and left coronary catheters, 6-  French pigtail ventriculographic catheter, 6-French left coronary artery  bypass graft catheter, and 6-French left internal mammary artery graft  catheter.   CONTRAST MATERIAL:  Omnipaque.   MEDICATIONS GIVEN PRIOR TO THE PROCEDURE:  Valium 10 mg p.o.   MEDICATIONS GIVEN DURING THE PROCEDURE:  Versed 2 mg IV.   COMMENTS:  The patient, in general, tolerated the procedure well.   ANGIOGRAPHIC DATA:  1. The left ventricular angiogram was performed in the RAO position.     Overall cardiac size was mildly increased, but basically within normal    limits.  Global ejection fraction would be estimated to be 50%.  There     was just a trace of mitral regurgitation noted.  There was mild anterior     hypokinesis present.  2. Left internal mammary artery graft to the LAD is widely patent with a     nice insertion and excellent distal runoff.  3. Radial artery graft to the distal obtuse marginal branch:  This graft is     widely patent with a nice insertion and excellent distal runoff.  4. Right coronary artery:  The right coronary artery is a reasonably small     vessel that does have a posterior descending artery, therefore, would be     dominant.  It is approximately 2.5 mm in diameter.  It has diffuse     irregularities, but no significant focal disease.  5. Left main coronary artery is normal.  6. Left anterior descending has segmental mild to moderate  disease of 30-50%     nature in its proximal 4-5 cm.  There is a bifurcation for a diagonal     vessel and continuation of the left anterior descending at this point in     time.  There is bidirectional fill into the left anterior descending.     There is satisfactory flow into the diagonal without significant     obstructive disease.  7. The left circumflex is a large, dominant system.  It is at least     codominant in nature.  There is 30-40% narrowing in the very proximal     portions.  The bypass obtuse marginal has a focal 80% ostial narrowing,     but has bidirectional fill distally.  There does not appear to be any     significant obstructive disease otherwise in the left circumflex.   HEMODYNAMIC DATA:  1. The aortic pressure was 116/63.  2. LV pressure was 109/2-7.  3. There was no aortic valve gradient noted on pullback.  There was no     gradient noted into the subclavian artery and from the pullback from the     left subclavian to the descending aorta.   OVERALL IMPRESSION:  1. Mild left ventricular dysfunction with trace mitral regurgitation and     mild anterior  hypokinesis.  2. Patent left internal mammary artery graft to the left anterior descending     and patent radial artery to the left circumflex.  3. Moderate coronary atherosclerosis in the proximal left anterior     descending and severe focal atherosclerosis in the obtuse marginal     branch, but otherwise only mild diffuse coronary atherosclerosis.   DISCUSSION:  It is felt that Mr. Dubose should be stable at this point in  time.  Will attempt to manage him medically.                                               Colleen Can. Deborah Chalk, M.D.    SNT/MEDQ  D:  01/06/2003  T:  01/06/2003  Job:  [id number]

## 2010-06-17 NOTE — Op Note (Signed)
NAME:  [name], BUCKNER NO.:  [id number]   MEDICAL RECORD NO.:  [id number]          PATIENT TYPE:  AMB   LOCATION:  SDS                          FACILITY:  [facility]   PHYSICIAN:  Burnard Bunting, M.D.    DATE OF BIRTH:  05-27-1931   DATE OF PROCEDURE:  05/16/2005  DATE OF DISCHARGE:  05/16/2005                                 OPERATIVE REPORT   PREOPERATIVE DIAGNOSIS:  Right knee medial and lateral meniscal tears.   POSTOPERATIVE DIAGNOSIS:  1.  Right knee medial and lateral meniscal tears.  2.  __________  medial femoral condyle.   OPERATION/PROCEDURE:  Right knee diagnosticand arthroscopy with partial  medial and lateral meniscectomy, chondroplasty of medial femoral condyle.   SURGEON:  Burnard Bunting, M.D.   ASSISTANT:  None.   ANESTHESIA:  General.   ESTIMATED BLOOD LOSS:  5 mL.   DRAINS:  None.   OPERATIVE FINDINGS:  1.  Examination under anesthesia range of motion 0 to 130 with failure to      __________.  ACL and PCL intact.  No posterolateral rotatory instability      was noted.  2.  Diagnostic and operative arthroscopy.  (1) Intact patellofemoral      compartment.  (2)  No loose bodies medial or lateral gutter.  (3) Intact      lateral compartment.  (4) Articular cartilage with tear of the lateral      meniscus involving 40% of the anterior posterior width anterior to the      popliteal. (5)  Intact ACL and PCL.  (6)  Full-thickness chondral loss      over 50% of weightbearing surface of the medial femoral condyle      __________ tibia plateau with an old buckle-handle tear of the medial      meniscus which had already split into radial meniscus which had already      split into its radial components.   DESCRIPTION OF PROCEDURE:  :  The patient was brought to the operating room  where general endotracheal anesthesia was induced, preoperative antibiotics  administered.  The right leg was prepped with DuraPrep solution and draped  in a sterile  manner.  Collier Flowers was used to cover the operative field.  The  topographic anatomy of the knee was identified including the medial and  lateral portion of the patellar tendon and the medial and lateral margin of  the joint line.  Anterior and inferior lateral portals were established.  Anterior and inferior medial portal was then established under direct  visualization.  Diagnostic arthroscopy was performed.  Patellofemoral  compartment was intact.  Medial and lateral gutters were free of foreign  bodies.  The medial compartment demonstrated chondral cartilage loss of 50%  weightbearing surface of the medial femoral condyle where the buckle-handle  portion of the medial meniscus tear was rubbing against the articular  surface.  There were corresponding changes of the tibia.  There was a bucket-  handle tear of the medial meniscus which had split into its two radial  components involving about 50-60% of anterior posterior width  of the  meniscus.  This tear was debrided back to a stable rim using a combination  basket punches and shavers.  __________ technique was used to perform  chondroplasty on the medial femoral condyle.  The lateral meniscus tear was  resected back to a stable rim using basket punches and shavers.  At this  time the knee was thoroughly irrigated.  Instruments were removed.  Portals  were closed using 3-0 nylon.  Solution of Marcaine, morphine and Clonidine  was injected into the knee.  The patient tolerated the procedure well  without immediate complications.  Bulky dressing was applied.           ______________________________  G. Dorene Grebe, M.D.     GSD/MEDQ  D:  05/16/2005  T:  05/17/2005  Job:  [id number]

## 2010-06-17 NOTE — Cardiovascular Report (Signed)
NAME:  [name], [name] NO.:  [id number]   MEDICAL RECORD NO.:  [id number]          PATIENT TYPE:  OIB   LOCATION:  2899                         FACILITY:  [facility]   PHYSICIAN:  Colleen Can. Deborah Chalk, M.D.DATE OF BIRTH:  04/20/1931   DATE OF PROCEDURE:  02/06/2006  DATE OF DISCHARGE:                            CARDIAC CATHETERIZATION   [name] presents with exertional chest discomfort.   He had a stress Cardiolite study which showed anterior ischemia.  He is  referred catheterization.   PROCEDURES:  1. Left heart catheterization with selective coronary angiography.  2. Left ventricular angiography.  3. Angiography of the left internal mammary artery.  4. Angiography of the radial artery free graft to the left circumflex.   TYPE AND SITE OF ENTRY:  Percutaneous right femoral artery with  AngioSeal.   CATHETERS:  A 6-French four curved, Judkins right and left coronary  cath, 6-French pigtail ventriculographic catheter, left internal mammary  graft catheter, a left coronary artery bypass graft catheter.   CONTRAST:  __________  Omnipaque.   MEDICATIONS:  Prior to procedure Valium 10 mg.   MEDICATIONS:  Given during procedure Versed 2 mg IV, Ancef 1 gram IV.   COMMENTS:  The patient tolerated the procedure well.   HEMODYNAMIC DATA:  The aortic pressure is 101/59, LV is 124/5-14.  There  is no aortic valve gradient noted on pullback.   ANGIOGRAPHIC DATA:  1. The left main coronary artery is essentially normal.  2. Left circumflex.  The left circumflex is a very large dominant      vessel.  It has irregularities proximally and scattered throughout.      There are several marginal vessels.  There is one distal marginal      vessel that receives the free radial graft and has competitive      flow.  It is a very distal branch.  3. Left anterior descending.  The left anterior descending has      sequential 50% lesions proximally.  There is a large diagonal  vessel that is free of significant obstructive disease.  There is      competitive flow into a left anterior descending.  The competitive      flow comes from the left internal mammary graft.  4. Right coronary artery.  The right coronary artery is a relatively      small nondominant vessel.  It has minor irregularities but no      significant obstructive disease.  5. The free radial graft to the left circumflex is widely patent.  It      is a relatively small vessel but adequately supplies this distal      marginal vessel.  6. Left internal mammary graft to the LAD.  The left internal mammary      graft to the LAD is widely patent with a nice insertion and good      distal runoff.  There is competitive flow distally.  In the mid-      portion of the graft, there are clips present.  There is some      haziness  in this area but overall there does not appear to be      significant obstructive disease.  The left internal mammary graft      is somewhat tortuous in its mid portion of the graft.   LEFT VENTRICULAR ANGIOGRAM:  Performed in the RAO position.  Overall,  cardiac size and silhouette are normal.  The global ejection fraction is  estimated to be 45%.  There is anterior hypokinesis.  There is 1+ mitral  regurgitation present.   OVERALL IMPRESSION:  1. Mild left ventricular dysfunction with anterior hypokinesia.  2. Moderate coronary atherosclerosis in the left anterior descending      system with patent left internal mammary graft to the left anterior      descending artery with haziness in the mid-portion of vessel      located near an extensive array but felt not to be obstructive.  3. Patent radial artery graft to the left circumflex.  4. One plus mitral regurgitation.   DISCUSSION:  In light of these findings, we will continue to follow Mr.  [name] medically.      Colleen Can. Deborah Chalk, M.D.  Electronically Signed     SNT/MEDQ  D:  02/06/2006  T:  02/06/2006  Job:   [id number]

## 2010-06-17 NOTE — Cardiovascular Report (Signed)
[facility]  Patient:    [name], [name]                     MRN: [id number] Proc. Date: 10/25/99 Adm. Date:  [id number] Disc. Date: [id number] Attending:  [place], Peter Manning CC:         Thomas A. Patty Sermons, M.D.   Cardiac Catheterization  The patient also has medical record #[id number]  INDICATIONS:  [name] is a 77 year old male with a long history of mitral valve prolapse and diabetes.  He had remote endocarditis and has now had progressive shortness of breath and was noted to have ventricular tachycardia at the time of exercise.  He is referred for evaluation.  PROCEDURE:  Left and right heart catheterization with selective coronary arteriogram and left ventricular angiogram.  TYPE AND SITE OF ENTRY:  Percutaneous right femoral artery, percutaneous right femoral vein.  CATHETERS:  A 6 French 4 curved Judkins right and left coronary catheters, 6 French pigtail ventriculographic catheter, 7 French Swan-Ganz thermodilution catheter.  CONTRAST MATERIAL:  Omnipaque.  MEDICATIONS GIVEN PRIOR TO THE PROCEDURE:  Valium 10 mg p.o.  MEDICATIONS GIVEN DURING THE PROCEDURE:  Ancef 1 g IV.  COMMENTS:  The patient tolerated the procedure well.  Perclose was used on the right femoral artery.  HEMODYNAMIC DATA:  The right atrial pressure showed a mean of 5, A wave of 8, V wave of 4.  RV was 30/8.aortic  Pulmonary artery pressure was 30/13, mean is 20, pulmonary capillary wedge pressure, mean of 12, A wave of 14, V wave of 17.  Aortic pressure was 107/65.  LV was 100/12.  The Fick cardiac output was 5.0 L/min. with a cardiac index of 2.2 L/min. per sq m.  Oxygen saturation 95%, pulmonary artery saturation is 74%.  Fick cardiac output was 6.1 with a cardiac index of 2.7.  ANGIOGRAPHIC DATA: 1. Left main coronary artery:  Left main coronary artery had mild    irregularity. 2. Left circumflex:  The left circumflex had irregularities proximally  and    was a large vessel.  There is a large second obtuse marginal that has    severe ostial stenosis of approximately 90%.  It is somewhat focal in    origin and would be well suited for bypass grafting. 3. The distal vessels are satisfactory and is somewhat of a codominant    distribution with a small right coronary artery. 4. Left anterior descending:  The left anterior descending is heavily    calcified proximally.  There is proximally 60% narrowing in the somewhat    diffuse manner in the proximal left anterior descending.  There is a    reasonably large diagonal vessel that has a 50-60% narrowing.  Just beyond    this in the continuation of the left anterior descending there is a 50%    narrowing.  Both of these vessel would be well suited for bypass grafting. 5. Right coronary artery:  The right coronary artery is a small dominant    vessel.  It is essentially normal.  LEFT VENTRICULOGRAPHY:  Left ventricular angiogram was performed in the RAO position.  There is heavy calcification of the mitral annulus.  There is at least 3+ mitral regurgitation noted with the left ventricular angiogram but there is clearly some degree of ectopy present.  There is mild anterior hypokinesis.  The global ejection fraction was calculated to be approximately 50%.  The anterior wall was hypokinetic.  OVERALL IMPRESSION: 1.  Severe mitral regurgitation. 2. Heavy mitral annular calcification. 3. Two-vessel coronary artery disease (moderately severe proximal    left anterior descending stenosis with a severe stenosis in the    second obtuse marginal branch). 4. Mild left ventricular dysfunction with mild anterior hypokinesis.  DISPOSITION:  It is felt that the patient has enough severe mitral regurgitation to warrant the possibility of mitral valve repair.  He does have coronary atherosclerosis and will need bypass surgery at that time.  He will have a TEE to assess the flexibility of his mitral  valve leaflets in consideration for a mitral valve repair. DD:  11/02/99 TD:  11/02/99 Job: [id number]

## 2010-06-17 NOTE — Discharge Summary (Signed)
[name], [name] NO.:  [id number]   MEDICAL RECORD NO.:  [id number]          PATIENT TYPE:  INP   LOCATION:  2017                         FACILITY:  [facility]   PHYSICIAN:  Duke Salvia, M.D.  DATE OF BIRTH:  Apr 02, 1931   DATE OF ADMISSION:  12/07/2004  DATE OF DISCHARGE:  12/09/2004                                 DISCHARGE SUMMARY   ALLERGIES:  COUMADIN which exacerbates his migraines.   DISCHARGE DIAGNOSES:  1.  Started on sotalol 48 hours ago for a patient with high-frequency of      premature ventricular complexes.  2.  After 48 hours of sotalol therapy frequency of premature ventricular      contractions has diminished to a great extent.  3.  The patient has history of high frequency of ventricular ectopy.  They      are multifocal in character, but predominantly it seems from the right      ventricular outflow tract.  4.  Symptoms which include progressive exercise tolerance were not improved      on a medical regimen of verapamil and Inderal.   SECONDARY DIAGNOSES:  1.  History of coronary artery disease with mitral valve prolapse, status      post coronary artery bypass graft surgery times two with mitral valve      annuloplasty. The grafts on the right radial artery to the distal obtuse      marginal and the left internal mammary artery to the left anterior      descending.  2.  Status post implant of dual-chamber Guidant Prism II 1861 dual chamber      cardioverter defibrillator for exercise induced nonsustained ventricular      tachycardia as primary prevention from sudden cardiac death.  3.  Type 2 diabetes times 25 years.  4.  History of migraines.  5.  Coumadin intolerance.  6.  History of mitral valve endocarditis.  7.  Dyslipidemia.  8.  Catheterization in December 2004 with an ejection fraction of 50%, left      internal mammary artery to the left anterior descending is patent, the      radial artery to the distal obtuse marginal is  patent, the right      coronary artery has diffuse irregularities, the obtuse marginal that is      bypassed has an 80% ostial stenosis, the left anterior descending had a      30% to 50% proximal stenosis, the left circumflex has a 30% to 40%      proximal stenosis. The left ventricular ejection fraction is 45% by      echocardiogram.   DISCHARGE DISPOSITION:  [name] is ready for discharge after  approximately 48  hours sotalol introduction and therapy. It is noted that  ventricular ectopy has greatly diminished since introduction of sotalol. The  patient has failed verapamil and Inderal therapy. In the past his symptoms  of progressive exercise tolerance where not abated by this medication.  Constant monitoring on telemetry does show significant decrease in his  ventricular ectopy now. In addition the QTc  has not become prolonged. After  40 hours it has remained at 472 milliseconds. This as per electrocardiogram  on November 10th. Electrocardiogram on November 9th after one dose of  sotalol of 474 milliseconds QTc.  He has been afebrile, vital signs have  been stable, blood pressure running about 102/63, oxygen saturating 945 on  room air. He has been in sinus rhythm with some pacing evident by telemetry.   LABORATORY STUDIES:  Complete blood count this admission revealed white  cells 5.2, hemoglobin 14.4, hematocrit 41.8, platelet count 145,000. Serum  electrolytes revealed sodium of 137, potassium 4, chloride 103, bicarbonate  26, BUN 11, creatinine 1.1, glucose 87. His PT was 13.2, INR 1.0, PTT 28,  magnesium 2.3.   [name] was discharged on the following medications:  1.  Sotalol 160 mg one tablet b.i.d. strictly 12 hours apart.  2.  Actos 45 mg daily.  3.  Allegra 60 mg daily.  4.  Glipizide ER 5 mg daily.  5.  Glucophage 500 mg b.i.d.  6.  Lipitor 80 mg daily.  7.  Enteric-coated aspirin 325 mg daily.  8.  Multivitamin daily.  9.  Ocuvite with ___________ one  daily.   He is to follow up with Dr. Graciela Husbands of [facility], [address], Wednesday, December 6th of 1:00.   DISCHARGE DIET:  Low sodium, low cholesterol, diabetic diet.   BRIEF HISTORY:  [name] is a 77 year old male with a prior history of  mitral valve prolapse, coronary artery disease, and diabetes for the past 25  years. He is status post mitral valve annuloplasty/coronary artery bypass  graft surgery times two both done at [facility] in 2002. He had  polymorphic ventricular tachycardia in the setting of the ischemia. He had  ICD implanted in 2004 for exercise induced SVT. He has been unable to  tolerate Coumadin since it worsens his migraines.   He relates a history of progressive exercise tolerance. He has to stop  halfway up the driveway when pushing a garbage can.  Interrogation of his  device and with Holter monitor both demonstrate high frequency of premature  ventricular complexes. They have several morphologies. Predominant  morphology is left bundle branch block, axis RVOT ectopy. He has been tried  on verapamil and Inderal for several weeks without improvement in his  symptoms or in frequency of his PVCs. He presents now for sotalol therapy.  An alternative would be amiodarone, but possible ablation in the future at  the [facility] would obviate this therapy because of prolonged washout  necessary. The patient is admitted now for introduction and monitoring of  sotalol therapy.   HOSPITAL COURSE:  The patient presented electively on the evening of  November 8th. He was started immediately on sotalol after laboratory studies  were obtained. He has been monitored for the next 48 hours with  diminishing amount of ectopy such that a 12-lead electrocardiogram does not  show during its stretch any ventricular ectopy. He goes on the medication dictated above and follow up with Dr. Graciela Husbands on Wednesday, December 6th at  1:00.      Maple Mirza, P.A.    ______________________________  Duke Salvia, M.D.    GM/MEDQ  D:  12/09/2004  T:  12/10/2004  Job:  [id number]   cc:   Alfonse Alpers. Dagoberto Ligas, M.D.  Fax: [phone]   Colleen Can. Deborah Chalk, M.D.  Fax: [phone]   Dr. [facility], [place], [place]

## 2010-06-20 ENCOUNTER — Telehealth: Payer: Self-pay | Admitting: Internal Medicine

## 2010-06-20 NOTE — Telephone Encounter (Signed)
Pt needs tikosyn to be call in to # [phone]. Pt Id# [id number].

## 2010-06-22 NOTE — Telephone Encounter (Signed)
Pt calling to speak to [facility] re rx she called in

## 2010-06-22 NOTE — Telephone Encounter (Signed)
Called patient and found that the purpose for his call was to order his [name] from [employer].   Called and ordered [name]. It will arrive in 7-10 days.  Order # [id number].  Will call patient when medication arrives.  [name], CMA/AAMA

## 2010-06-28 ENCOUNTER — Telehealth: Payer: Self-pay | Admitting: *Deleted

## 2010-06-28 NOTE — Telephone Encounter (Signed)
I called the patient and made him aware his [name] has arrived in the office and will be placed at the front desk.

## 2010-06-30 ENCOUNTER — Ambulatory Visit (INDEPENDENT_AMBULATORY_CARE_PROVIDER_SITE_OTHER): Payer: Medicare Other | Admitting: *Deleted

## 2010-06-30 DIAGNOSIS — I472 Ventricular tachycardia: Secondary | ICD-10-CM

## 2010-06-30 DIAGNOSIS — I4729 Other ventricular tachycardia: Secondary | ICD-10-CM

## 2010-07-01 ENCOUNTER — Other Ambulatory Visit: Payer: Self-pay

## 2010-07-06 ENCOUNTER — Encounter: Payer: Self-pay | Admitting: Internal Medicine

## 2010-07-06 NOTE — Progress Notes (Signed)
ICD REMOTE  

## 2010-07-31 ENCOUNTER — Encounter: Payer: Self-pay | Admitting: *Deleted

## 2010-08-01 ENCOUNTER — Encounter: Payer: Self-pay | Admitting: Internal Medicine

## 2010-08-01 ENCOUNTER — Ambulatory Visit (INDEPENDENT_AMBULATORY_CARE_PROVIDER_SITE_OTHER): Payer: Medicare Other | Admitting: Internal Medicine

## 2010-08-01 DIAGNOSIS — I2510 Atherosclerotic heart disease of native coronary artery without angina pectoris: Secondary | ICD-10-CM

## 2010-08-01 DIAGNOSIS — I428 Other cardiomyopathies: Secondary | ICD-10-CM

## 2010-08-01 DIAGNOSIS — Z95810 Presence of automatic (implantable) cardiac defibrillator: Secondary | ICD-10-CM

## 2010-08-01 DIAGNOSIS — I4949 Other premature depolarization: Secondary | ICD-10-CM

## 2010-08-01 DIAGNOSIS — R42 Dizziness and giddiness: Secondary | ICD-10-CM

## 2010-08-01 DIAGNOSIS — I4729 Other ventricular tachycardia: Secondary | ICD-10-CM | POA: Insufficient documentation

## 2010-08-01 DIAGNOSIS — I472 Ventricular tachycardia: Secondary | ICD-10-CM

## 2010-08-01 DIAGNOSIS — I25118 Atherosclerotic heart disease of native coronary artery with other forms of angina pectoris: Secondary | ICD-10-CM | POA: Insufficient documentation

## 2010-08-01 DIAGNOSIS — I4891 Unspecified atrial fibrillation: Secondary | ICD-10-CM

## 2010-08-01 DIAGNOSIS — I951 Orthostatic hypotension: Secondary | ICD-10-CM

## 2010-08-01 LAB — ICD DEVICE OBSERVATION
AL AMPLITUDE: 5 mv
AL IMPEDENCE ICD: 412.5 Ohm
ATRIAL PACING ICD: 93 pct
BAMS-0001: 150 {beats}/min
BAMS-0003: 70 {beats}/min
BATTERY VOLTAGE: 3.023 V
DEV-0020ICD: NEGATIVE
DEVICE MODEL ICD: 554225
FVT: 0
HV IMPEDENCE: 40 Ohm
MODE SWITCH EPISODES: 648
PACEART VT: 0
RV LEAD AMPLITUDE: 9.3 mv
RV LEAD IMPEDENCE ICD: 562.5 Ohm
TOT-0006: 20100519000000
TOT-0007: 1
TOT-0008: 0
TOT-0009: 1
TOT-0010: 17
TZAT-0001FASTVT: 1
TZAT-0004FASTVT: 8
TZAT-0012FASTVT: 200 ms
TZAT-0013FASTVT: 1
TZAT-0018FASTVT: NEGATIVE
TZAT-0019FASTVT: 7.5 V
TZAT-0020FASTVT: 1 ms
TZON-0003FASTVT: 350 ms
TZON-0003SLOWVT: 425 ms
TZON-0004FASTVT: 20
TZON-0004SLOWVT: 15
TZON-0005FASTVT: 6
TZON-0005SLOWVT: 6
TZON-0010FASTVT: 80 ms
TZON-0010SLOWVT: 80 ms
TZST-0001FASTVT: 2
TZST-0001FASTVT: 3
TZST-0001FASTVT: 4
TZST-0001FASTVT: 5
TZST-0003FASTVT: 30 J
TZST-0003FASTVT: 36 J
TZST-0003FASTVT: 36 J
TZST-0003FASTVT: 36 J
VENTRICULAR PACING ICD: 84 pct
VF: 0

## 2010-08-01 NOTE — Assessment & Plan Note (Signed)
chronic

## 2010-08-01 NOTE — Assessment & Plan Note (Signed)
No intercurrent ventricular tachycardia 

## 2010-08-01 NOTE — Assessment & Plan Note (Signed)
He is paroxysmally to fibrillation with a reasonably controlled ventricular response. Over the last month it has been less than 1% of the time.

## 2010-08-01 NOTE — Assessment & Plan Note (Signed)
Patient has episodes consistent with vasomotor instability with low blood pressures. The trigger of these events is not clear but in the context of diabetes and being vertical I suspect that this is related to diabetic autonomic neuropathy. I've asked his wife to try recorded heart rates with his next episode. We have spent a long time I greater than 40 minutes discussing the physiology as well as factors and behaviors that might mitigate his symptoms.  We have also reprogrammed his ICD to look for slow ventricular tachycardia as a potential trigger. We have to keep in mind that a Bezold Jarisch  reflex is a possible trigger as well and Myoview scanning may be appropriate although it is less likely given the lack of associated exertion

## 2010-08-01 NOTE — Assessment & Plan Note (Signed)
Stable on current medications 

## 2010-08-01 NOTE — Progress Notes (Signed)
HPI  [name] is a 77 y.o. male Seen in followup for polymorphic ventricular tachycardia for which he has an ICD and atrial fibrillation.  He also has a history of coronary disease with prior CABG mitral valve repair both of which were done at [facility] clinic at that time he also had a PFO closure.  He's had recently episodes of significant dizziness. These are accompanied by nausea diaphoresis and extreme pallor. His wife has recorded his blood pressure at these times is in the 70-80 range. Investigation of the coracoid to these events with arrhythmia he has ICD had shown nothing in the ablation zone with a detection rate of 150 and the ventricular tachycardia zone with a heart rate of 170.  He has seen intolerance. The episodes have mostly occurred while standing and been ameliorated by sitting.  He does take hot showers.    Past Medical History  Diagnosis Date  . Chest pain   . Common migraine   . Hypothyroidism   . Raynaud's disease   . Hiatal hernia   . GERD (gastroesophageal reflux disease)   . Esophageal stricture   . IBS (irritable bowel syndrome)   . Constipation     Past Surgical History  Procedure Date  . Vesicovaginal fistula closure w/ tah 1976  . Tonsillectomy 1990  . Ectopic pregnancy surgery 1970  . Rhinoplasty 1980  . Foot surgery 1997    Current Outpatient Prescriptions  Medication Sig Dispense Refill  . aspirin 81 MG tablet Take 81 mg by mouth daily.        Marland Kitchen atorvastatin (LIPITOR) 80 MG tablet Take 1 tablet by mouth Daily.      . Cholecalciferol (VITAMIN D-3) 5000 UNITS TABS Take by mouth daily.        Marland Kitchen dofetilide ([name]) 500 MCG capsule Take 500 mcg by mouth 2 (two) times daily.        . Magnesium 250 MG TABS Take by mouth daily.        . Multiple Vitamins-Minerals (OCUVITE-LUTEIN PO) Take by mouth daily.        . Multiple Vitamins-Minerals (ZINC PO) Take by mouth daily.        . nateglinide (STARLIX) 120 MG tablet Take 1 tablet by  mouth Twice daily.      . pioglitazone (ACTOS) 45 MG tablet Take 45 mg by mouth daily.        . riboflavin (VITAMIN B-2) 100 MG TABS Take 100 mg by mouth daily.        . sitaGLIPtin (JANUVIA) 100 MG tablet Take 100 mg by mouth daily.        Marland Kitchen DISCONTD: TOPROL XL 50 MG 24 hr tablet Take 1 tablet by mouth Daily.      Marland Kitchen DISCONTD: ALPRAZolam (XANAX) 0.25 MG tablet Take 0.25 mg by mouth as needed.        Marland Kitchen DISCONTD: Ascorbic Acid (VITAMIN C) 1000 MG tablet Take 1,000 mg by mouth daily.        Marland Kitchen DISCONTD: aspirin 325 MG tablet Take 325 mg by mouth daily.        Marland Kitchen DISCONTD: Cholecalciferol (VITAMIN D PO) Take by mouth daily.        Marland Kitchen DISCONTD: Coenzyme Q10 (CO Q-10) 120 MG CAPS Take 1 capsule by mouth daily.        Marland Kitchen DISCONTD: Cyanocobalamin (B-12) 1500 MCG TBCR Take 1 tablet by mouth daily.        Marland Kitchen DISCONTD: Omega-3 Fatty Acids (FISH  OIL) 1000 MG CAPS Take 1 capsule by mouth daily.        Marland Kitchen DISCONTD: pantoprazole (PROTONIX) 40 MG tablet Take 40 mg by mouth daily.        Marland Kitchen DISCONTD: Selenium 200 MCG TABS Take 1 tablet by mouth daily.        Marland Kitchen DISCONTD: thyroid (ARMOUR) 90 MG tablet Take 90 mg by mouth daily.        Marland Kitchen DISCONTD: vitamin A 94854 UNIT capsule Take 10,000 Units by mouth daily.        Marland Kitchen DISCONTD: VITAMIN K PO Take by mouth daily.          Allergies  Allergen Reactions  . Warfarin Sodium     REACTION: migraine headaches/vision impariment    Review of Systems negative except from HPI and PMH  Physical Exam Well developed and well nourished in no acute distress HENT normal E scleral and icterus clear Neck Supple JVP flat; carotids brisk and full Clear to ausculation Regular rate and rhythm,with frequent ectopics no significant murmurs Soft with active bowel sounds No clubbing cyanosis and edema Alert and oriented, grossl skin was clammy but warm      Assessment and  Plan

## 2010-08-01 NOTE — Patient Instructions (Addendum)
Your physician has recommended you make the following change in your medication:  1) Decrease metoprolol succ to 50mg  1/2 tablet by mouth at bedtime.  Your physician wants you to follow-up in: 4 months. You will receive a reminder letter in the mail two months in advance. If you don't receive a letter, please call our office to schedule the follow-up appointment.

## 2010-09-15 ENCOUNTER — Encounter: Payer: Self-pay | Admitting: Internal Medicine

## 2010-09-19 ENCOUNTER — Telehealth: Payer: Self-pay | Admitting: Internal Medicine

## 2010-09-19 NOTE — Telephone Encounter (Signed)
Pt calling re med called in, asked for [facility]

## 2010-09-20 NOTE — Telephone Encounter (Signed)
Pts [name] ordered today for #60 x 3 bottles.  [name] should be delivered within 7-10 days.  Order # [id number]. [name], CMA

## 2010-09-26 ENCOUNTER — Telehealth: Payer: Self-pay | Admitting: *Deleted

## 2010-09-26 NOTE — Telephone Encounter (Signed)
Pt aware tikosyn at the front desk for pick up Kaitlyn Franko  

## 2010-09-29 ENCOUNTER — Encounter: Payer: Medicare Other | Admitting: *Deleted

## 2010-10-21 LAB — APTT: aPTT: 25

## 2010-10-21 LAB — PROTIME-INR
INR: 1
Prothrombin Time: 13

## 2010-10-21 LAB — COMPREHENSIVE METABOLIC PANEL
ALT: 24
AST: 27
Albumin: 4.1
Alkaline Phosphatase: 64
BUN: 11
CO2: 30
Calcium: 9.2
Chloride: 103
Creatinine, Ser: 0.95
GFR calc Af Amer: >60
GFR calc non Af Amer: >60
Glucose, Bld: 213 — ABNORMAL HIGH
Potassium: 4.2
Sodium: 136
Total Bilirubin: 1.7 — ABNORMAL HIGH
Total Protein: 6.9

## 2010-10-21 LAB — CBC
HCT: 42.3
Hemoglobin: 14.9
MCHC: 35.2
MCV: 98.2
Platelets: 148 — ABNORMAL LOW
RBC: 4.3
RDW: 12.6
WBC: 5

## 2010-10-24 LAB — CARDIAC PANEL(CRET KIN+CKTOT+MB+TROPI)
CK, MB: 1
CK, MB: 1.1
CK, MB: 1.2
CK, MB: 1.2
CK, MB: 1.4
CK, MB: 1.5
CK, MB: 2.3
Relative Index: 2.3
Relative Index: INVALID
Relative Index: INVALID
Relative Index: INVALID
Relative Index: INVALID
Relative Index: INVALID
Relative Index: INVALID
Total CK: 100
Total CK: 69
Total CK: 74
Total CK: 77
Total CK: 78
Total CK: 81
Total CK: 82
Troponin I: 0.02
Troponin I: 0.03
Troponin I: 0.03
Troponin I: 0.03
Troponin I: 0.04
Troponin I: 0.04
Troponin I: 0.06

## 2010-10-24 LAB — BASIC METABOLIC PANEL
BUN: 13
BUN: 9
CO2: 23
CO2: 26
Calcium: 8.6
Calcium: 8.7
Chloride: 100
Chloride: 104
Creatinine, Ser: 0.98
Creatinine, Ser: 1.02
GFR calc Af Amer: >60
GFR calc Af Amer: >60
GFR calc non Af Amer: >60
GFR calc non Af Amer: >60
Glucose, Bld: 124 — ABNORMAL HIGH
Glucose, Bld: 238 — ABNORMAL HIGH
Potassium: 4.2
Potassium: 4.4
Sodium: 132 — ABNORMAL LOW
Sodium: 137

## 2010-10-24 LAB — CBC
HCT: 39.1
HCT: 41.5
Hemoglobin: 13.7
Hemoglobin: 14.4
MCHC: 34.8
MCHC: 35
MCV: 98.1
MCV: 98.8
Platelets: 116 — ABNORMAL LOW
Platelets: 124 — ABNORMAL LOW
RBC: 3.96 — ABNORMAL LOW
RBC: 4.23
RDW: 13
RDW: 13.3
WBC: 5.8
WBC: 5.9

## 2010-10-24 LAB — LIPID PANEL
Cholesterol: 195
HDL: 47
LDL Cholesterol: 102 — ABNORMAL HIGH
Total CHOL/HDL Ratio: 4.1
Triglycerides: 231 — ABNORMAL HIGH
VLDL: 46 — ABNORMAL HIGH

## 2010-10-24 LAB — TROPONIN I: Troponin I: 0.04

## 2010-10-24 LAB — PROTIME-INR
INR: 1
Prothrombin Time: 13.1

## 2010-10-24 LAB — B-NATRIURETIC PEPTIDE (CONVERTED LAB): Pro B Natriuretic peptide (BNP): 463 — ABNORMAL HIGH

## 2010-10-24 LAB — CK TOTAL AND CKMB (NOT AT ARMC)
CK, MB: 2.6
Relative Index: 2.3
Total CK: 112

## 2010-10-24 LAB — APTT: aPTT: 27

## 2010-10-24 LAB — TSH: TSH: 0.986

## 2010-11-10 ENCOUNTER — Telehealth: Payer: Self-pay | Admitting: Internal Medicine

## 2010-11-10 DIAGNOSIS — I4891 Unspecified atrial fibrillation: Secondary | ICD-10-CM

## 2010-11-10 MED ORDER — METOPROLOL SUCCINATE ER 25 MG PO TB24: 25.0000 mg | ORAL_TABLET | Freq: Every day | ORAL | Status: DC

## 2010-11-10 NOTE — Telephone Encounter (Signed)
RX sent to [facility] for metoprolol 25mg  once daily. The patient is aware.

## 2010-11-10 NOTE — Telephone Encounter (Signed)
Pt needs a change in his Toprol.  The strength has been changed and he needs a new pres. For this. 25 mg.  [facility] mail order.  [phone]

## 2010-11-21 ENCOUNTER — Encounter: Payer: Self-pay | Admitting: Internal Medicine

## 2010-12-09 ENCOUNTER — Encounter: Payer: Self-pay | Admitting: Internal Medicine

## 2010-12-09 ENCOUNTER — Ambulatory Visit (INDEPENDENT_AMBULATORY_CARE_PROVIDER_SITE_OTHER): Payer: Medicare Other | Admitting: Internal Medicine

## 2010-12-09 DIAGNOSIS — I951 Orthostatic hypotension: Secondary | ICD-10-CM

## 2010-12-09 DIAGNOSIS — I4891 Unspecified atrial fibrillation: Secondary | ICD-10-CM

## 2010-12-09 DIAGNOSIS — I472 Ventricular tachycardia: Secondary | ICD-10-CM

## 2010-12-09 DIAGNOSIS — I4949 Other premature depolarization: Secondary | ICD-10-CM

## 2010-12-09 DIAGNOSIS — R42 Dizziness and giddiness: Secondary | ICD-10-CM

## 2010-12-09 DIAGNOSIS — I2510 Atherosclerotic heart disease of native coronary artery without angina pectoris: Secondary | ICD-10-CM

## 2010-12-09 DIAGNOSIS — I4729 Other ventricular tachycardia: Secondary | ICD-10-CM

## 2010-12-09 DIAGNOSIS — Z95810 Presence of automatic (implantable) cardiac defibrillator: Secondary | ICD-10-CM

## 2010-12-09 LAB — ICD DEVICE OBSERVATION
AL AMPLITUDE: 5 mv
AL IMPEDENCE ICD: 462.5 Ohm
AL THRESHOLD: 0.75 V
ATRIAL PACING ICD: 91 pct
BAMS-0001: 150 {beats}/min
BAMS-0003: 70 {beats}/min
BATTERY VOLTAGE: 2.9478 V
DEV-0020ICD: NEGATIVE
DEVICE MODEL ICD: 554225
FVT: 0
HV IMPEDENCE: 42 Ohm
MODE SWITCH EPISODES: 158
PACEART VT: 0
RV LEAD AMPLITUDE: 8.8 mv
RV LEAD IMPEDENCE ICD: 562.5 Ohm
RV LEAD THRESHOLD: 0.5 V
TOT-0006: 20100519000000
TOT-0007: 1
TOT-0008: 0
TOT-0009: 1
TOT-0010: 18
TZAT-0001FASTVT: 1
TZAT-0004FASTVT: 8
TZAT-0012FASTVT: 200 ms
TZAT-0013FASTVT: 1
TZAT-0018FASTVT: NEGATIVE
TZAT-0019FASTVT: 7.5 V
TZAT-0020FASTVT: 1 ms
TZON-0003FASTVT: 350 ms
TZON-0003SLOWVT: 425 ms
TZON-0004FASTVT: 20
TZON-0004SLOWVT: 15
TZON-0005FASTVT: 6
TZON-0005SLOWVT: 6
TZON-0010FASTVT: 80 ms
TZON-0010SLOWVT: 80 ms
TZST-0001FASTVT: 2
TZST-0001FASTVT: 3
TZST-0001FASTVT: 4
TZST-0001FASTVT: 5
TZST-0003FASTVT: 30 J
TZST-0003FASTVT: 36 J
TZST-0003FASTVT: 36 J
TZST-0003FASTVT: 36 J
VENTRICULAR PACING ICD: 81 pct
VF: 0

## 2010-12-09 NOTE — Assessment & Plan Note (Signed)
Largely stable

## 2010-12-09 NOTE — Assessment & Plan Note (Signed)
No recurrent ventricular tachycardia 

## 2010-12-09 NOTE — Patient Instructions (Signed)

## 2010-12-09 NOTE — Progress Notes (Signed)
HPI  [name] is a 77 y.o. male Seen in followup for polymorphic ventricular tachycardia for which he has an ICD and atrial fibrillation.  He also has a history of coronary disease with prior CABG mitral valve repair both of which were done at [facility] clinic at that time he also had a PFO closure. \  At his last visit, he was having significant dizziness. He modified his metoprolol dosing and he started taking it at night. This has been associated with a marked improvement in energy and decrease in his orthostatic symptoms. He says he feels terrific The patient denies chest pain, shortness of breath, nocturnal dyspnea, orthopnea or peripheral edema.    Past Medical History  Diagnosis Date  . Coronary artery disease     [place]  [place] [facility]  . Ventricular tachycardia, polymorphic     status post ICD implantation  . Raynaud's disease   . ICD (implantable cardiac defibrillator) in place   . Diabetes mellitus   . Endocarditis, valve unspecified     Mitral  . PAF (paroxysmal atrial fibrillation)   . PVC's (premature ventricular contractions)   . History of seasonal allergies   . History of migraine headaches   . Pleural effusion     Past Surgical History  Procedure Date  . Mitral valve replacement   . Coronary artery bypass graft 2001    2 VESSEL CABG AND MITRAL VALVE REPAIR. HE HAD A LIMA GRAFT TO THE LAD AND RADIAL ARTERY  GRAFT TO THE OBTUSE MARGINAL  OF LEFT CIRCUMFLEX  . Cardiac defibrillator placement   . Hemorroidectomy   . Knee arthroscopy     RIGHT KNEE  . Cardiac catheterization 04/03/2007    EF 40%  . Cardiac catheterization 02/06/2006    EF 45%. ANTERIOR HYPOKINESIS  . Cardiac catheterization 05/29/2000    EF 50%. MILD ANTERIOR HYPOKINESIS  . Cardiac catheterization 10/25/1999    EF 50%. SEVERE MITRAL REGURGITATION  . Cardiac catheterization 01/06/2003    EF 50%  . Transthoracic echocardiogram 04/02/2007    EF 35%    Current Outpatient  Prescriptions  Medication Sig Dispense Refill  . aspirin 81 MG tablet Take 81 mg by mouth daily.        Marland Kitchen atorvastatin (LIPITOR) 80 MG tablet Take 1 tablet by mouth Daily.      . Cholecalciferol (VITAMIN D-3) 5000 UNITS TABS Take by mouth daily.        Marland Kitchen dofetilide ([name]) 500 MCG capsule Take 500 mcg by mouth 2 (two) times daily.        . Magnesium 250 MG TABS Take by mouth daily.        . metoprolol succinate (TOPROL-XL) 25 MG 24 hr tablet Take 1 tablet (25 mg total) by mouth daily.  90 tablet  3  . Multiple Vitamins-Minerals (OCUVITE-LUTEIN PO) Take by mouth daily.        . Multiple Vitamins-Minerals (ZINC PO) Take by mouth daily.        . nateglinide (STARLIX) 120 MG tablet Take 1 tablet by mouth Twice daily.      . pioglitazone (ACTOS) 45 MG tablet Take 45 mg by mouth daily.        . riboflavin (VITAMIN B-2) 100 MG TABS Take 100 mg by mouth daily.        . sitaGLIPtin (JANUVIA) 100 MG tablet Take 100 mg by mouth daily.          Allergies  Allergen Reactions  . Sotalol   .  Warfarin Sodium     REACTION: migraine headaches/vision impariment    Review of Systems negative except from HPI and PMH  Physical Exam Well developed and well nourished in no acute distress HENT normal E scleral and icterus clear Neck Supple JVP flat; carotids brisk and full Clear to ausculation Regular rate and rhythm, no murmurs gallops or rub Soft with active bowel sounds No clubbing cyanosis and edema Alert and oriented, grossly normal motor and sensory function Skin Warm and Dry  \  Assessment and  Plan

## 2010-12-09 NOTE — Assessment & Plan Note (Signed)
The patient's device was interrogated.  The information was reviewed. No changes were made in the programming.    

## 2010-12-09 NOTE — Assessment & Plan Note (Signed)
Stable on current medication.  

## 2010-12-09 NOTE — Assessment & Plan Note (Addendum)
Resolved with adjusting of his metoprolol

## 2010-12-09 NOTE — Assessment & Plan Note (Signed)
Continues to take [name]. These labs are being followed by Dr. Leslie Dales.  There is frequent episodes on his device, approximately 1%.  We had a long discussion regarding anticoagulation. He was intolerant of warfarin because of migraines. We discussed the concerns of bleeding related to Pradaxa and the alternatives that are forthcoming. He should be on oral anticoagulation

## 2011-01-09 ENCOUNTER — Telehealth: Payer: Self-pay | Admitting: Internal Medicine

## 2011-01-09 NOTE — Telephone Encounter (Signed)
New msg Pt wants refill of tikosyn to [facility] please call him back  He only has 1wk left

## 2011-01-10 NOTE — Telephone Encounter (Signed)
I spoke with [employer]. The patient's med refill was called in yesterday per [employer] customer service. This should arrive within 7-10 business days. The patient's enrollment will also run out on 01/30/11. [employer] is mailing a new enrollment form to the patient. He should complete his portion and then bring to our office with his proof of income for [name] to complete and mail with a new RX after 01/30/11. I have notified the patient of the above. He verbalizes understanding.

## 2011-01-10 NOTE — Telephone Encounter (Signed)
Pt calling back re msg he left yesterday, needs tikosyn reordered, only has 1 week left, pt requesting call when called in [phone]

## 2011-01-16 ENCOUNTER — Telehealth: Payer: Self-pay | Admitting: Internal Medicine

## 2011-01-16 NOTE — Telephone Encounter (Signed)
New msg Pt was checking status of tikosyn. Please let him know

## 2011-01-16 NOTE — Telephone Encounter (Signed)
I spoke with the patient and made him aware that his [name] is at the front desk for him to pick up.

## 2011-03-09 ENCOUNTER — Ambulatory Visit (INDEPENDENT_AMBULATORY_CARE_PROVIDER_SITE_OTHER): Payer: Medicare Other | Admitting: *Deleted

## 2011-03-09 DIAGNOSIS — I472 Ventricular tachycardia, unspecified: Secondary | ICD-10-CM

## 2011-03-09 DIAGNOSIS — Z95810 Presence of automatic (implantable) cardiac defibrillator: Secondary | ICD-10-CM | POA: Diagnosis not present

## 2011-03-09 DIAGNOSIS — I4729 Other ventricular tachycardia: Secondary | ICD-10-CM

## 2011-03-10 ENCOUNTER — Encounter: Payer: Self-pay | Admitting: Internal Medicine

## 2011-03-10 LAB — REMOTE ICD DEVICE
AL AMPLITUDE: 2.3 mv
AL IMPEDENCE ICD: 410 Ohm
ATRIAL PACING ICD: 89 pct
BAMS-0001: 150 {beats}/min
BAMS-0003: 70 {beats}/min
BATTERY VOLTAGE: 2.81 V
DEV-0020ICD: NEGATIVE
DEVICE MODEL ICD: 554225
HV IMPEDENCE: 42 Ohm
MODE SWITCH EPISODES: 180
RV LEAD AMPLITUDE: 8.5 mv
RV LEAD IMPEDENCE ICD: 510 Ohm
TZAT-0001FASTVT: 1
TZAT-0004FASTVT: 8
TZAT-0012FASTVT: 200 ms
TZAT-0013FASTVT: 1
TZAT-0018FASTVT: NEGATIVE
TZAT-0019FASTVT: 7.5 V
TZAT-0020FASTVT: 1 ms
TZON-0003FASTVT: 350 ms
TZON-0003SLOWVT: 425 ms
TZON-0004FASTVT: 20
TZON-0004SLOWVT: 15
TZON-0005FASTVT: 6
TZON-0005SLOWVT: 6
TZON-0010FASTVT: 80 ms
TZON-0010SLOWVT: 80 ms
TZST-0001FASTVT: 2
TZST-0001FASTVT: 3
TZST-0001FASTVT: 4
TZST-0001FASTVT: 5
TZST-0003FASTVT: 30 J
TZST-0003FASTVT: 36 J
TZST-0003FASTVT: 36 J
TZST-0003FASTVT: 36 J
VENTRICULAR PACING ICD: 76 pct

## 2011-03-15 ENCOUNTER — Telehealth: Payer: Self-pay | Admitting: *Deleted

## 2011-03-15 NOTE — Telephone Encounter (Signed)
I spoke with the patient. He is aware his [name] has arrived from the company for pick up.

## 2011-03-17 NOTE — Progress Notes (Signed)
ICD remote 

## 2011-03-21 ENCOUNTER — Encounter: Payer: Self-pay | Admitting: *Deleted

## 2011-03-30 DIAGNOSIS — E11339 Type 2 diabetes mellitus with moderate nonproliferative diabetic retinopathy without macular edema: Secondary | ICD-10-CM | POA: Diagnosis not present

## 2011-03-30 DIAGNOSIS — E1139 Type 2 diabetes mellitus with other diabetic ophthalmic complication: Secondary | ICD-10-CM | POA: Diagnosis not present

## 2011-03-30 DIAGNOSIS — H35379 Puckering of macula, unspecified eye: Secondary | ICD-10-CM | POA: Diagnosis not present

## 2011-04-06 DIAGNOSIS — M79609 Pain in unspecified limb: Secondary | ICD-10-CM | POA: Diagnosis not present

## 2011-04-06 DIAGNOSIS — B351 Tinea unguium: Secondary | ICD-10-CM | POA: Diagnosis not present

## 2011-04-11 DIAGNOSIS — E559 Vitamin D deficiency, unspecified: Secondary | ICD-10-CM | POA: Diagnosis not present

## 2011-04-11 DIAGNOSIS — I8310 Varicose veins of unspecified lower extremity with inflammation: Secondary | ICD-10-CM | POA: Diagnosis not present

## 2011-04-11 DIAGNOSIS — E785 Hyperlipidemia, unspecified: Secondary | ICD-10-CM | POA: Diagnosis not present

## 2011-04-11 DIAGNOSIS — E119 Type 2 diabetes mellitus without complications: Secondary | ICD-10-CM | POA: Diagnosis not present

## 2011-04-12 DIAGNOSIS — I8310 Varicose veins of unspecified lower extremity with inflammation: Secondary | ICD-10-CM | POA: Diagnosis not present

## 2011-04-13 DIAGNOSIS — E559 Vitamin D deficiency, unspecified: Secondary | ICD-10-CM | POA: Diagnosis not present

## 2011-04-13 DIAGNOSIS — E785 Hyperlipidemia, unspecified: Secondary | ICD-10-CM | POA: Diagnosis not present

## 2011-04-13 DIAGNOSIS — E11329 Type 2 diabetes mellitus with mild nonproliferative diabetic retinopathy without macular edema: Secondary | ICD-10-CM | POA: Diagnosis not present

## 2011-04-13 DIAGNOSIS — E1139 Type 2 diabetes mellitus with other diabetic ophthalmic complication: Secondary | ICD-10-CM | POA: Diagnosis not present

## 2011-04-18 DIAGNOSIS — M79609 Pain in unspecified limb: Secondary | ICD-10-CM | POA: Diagnosis not present

## 2011-04-21 ENCOUNTER — Encounter: Payer: Self-pay | Admitting: Internal Medicine

## 2011-05-09 DIAGNOSIS — M79609 Pain in unspecified limb: Secondary | ICD-10-CM | POA: Diagnosis not present

## 2011-05-15 DIAGNOSIS — D239 Other benign neoplasm of skin, unspecified: Secondary | ICD-10-CM | POA: Diagnosis not present

## 2011-05-15 DIAGNOSIS — L570 Actinic keratosis: Secondary | ICD-10-CM | POA: Diagnosis not present

## 2011-05-15 DIAGNOSIS — L821 Other seborrheic keratosis: Secondary | ICD-10-CM | POA: Diagnosis not present

## 2011-06-08 ENCOUNTER — Ambulatory Visit (INDEPENDENT_AMBULATORY_CARE_PROVIDER_SITE_OTHER): Payer: Medicare Other | Admitting: *Deleted

## 2011-06-08 ENCOUNTER — Encounter: Payer: Self-pay | Admitting: Internal Medicine

## 2011-06-08 DIAGNOSIS — I4729 Other ventricular tachycardia: Secondary | ICD-10-CM

## 2011-06-08 DIAGNOSIS — I472 Ventricular tachycardia: Secondary | ICD-10-CM | POA: Diagnosis not present

## 2011-06-08 LAB — REMOTE ICD DEVICE
AL AMPLITUDE: 3.5 mv
AL IMPEDENCE ICD: 410 Ohm
ATRIAL PACING ICD: 91 pct
BAMS-0001: 150 {beats}/min
BAMS-0003: 70 {beats}/min
BATTERY VOLTAGE: 2.66 V
DEV-0020ICD: NEGATIVE
DEVICE MODEL ICD: 554225
HV IMPEDENCE: 44 Ohm
MODE SWITCH EPISODES: 141
RV LEAD AMPLITUDE: 8.6 mv
RV LEAD IMPEDENCE ICD: 550 Ohm
TZAT-0001FASTVT: 1
TZAT-0004FASTVT: 8
TZAT-0012FASTVT: 200 ms
TZAT-0013FASTVT: 1
TZAT-0018FASTVT: NEGATIVE
TZAT-0019FASTVT: 7.5 V
TZAT-0020FASTVT: 1 ms
TZON-0003FASTVT: 350 ms
TZON-0003SLOWVT: 425 ms
TZON-0004FASTVT: 20
TZON-0004SLOWVT: 15
TZON-0005FASTVT: 6
TZON-0005SLOWVT: 6
TZON-0010FASTVT: 80 ms
TZON-0010SLOWVT: 80 ms
TZST-0001FASTVT: 2
TZST-0001FASTVT: 3
TZST-0001FASTVT: 4
TZST-0001FASTVT: 5
TZST-0003FASTVT: 30 J
TZST-0003FASTVT: 36 J
TZST-0003FASTVT: 36 J
TZST-0003FASTVT: 36 J
VENTRICULAR PACING ICD: 83 pct

## 2011-06-19 ENCOUNTER — Telehealth: Payer: Self-pay | Admitting: Internal Medicine

## 2011-06-19 NOTE — Telephone Encounter (Signed)
Refill placed for [name]- order # [id number].

## 2011-06-19 NOTE — Telephone Encounter (Signed)
I spoke with the patient. He confirmed he just needs his med refilled. I attempted to call the [employer] program and the automated refill system is down. It forwarded me to a representative who told me I would have to call back later when the automated system is working. I will call back later today.

## 2011-06-19 NOTE — Telephone Encounter (Signed)
Please return call to patient regarding [name] refill, he can be reached at [phone].

## 2011-06-20 ENCOUNTER — Encounter: Payer: Self-pay | Admitting: *Deleted

## 2011-06-23 NOTE — Progress Notes (Signed)
ICD remote 

## 2011-06-27 ENCOUNTER — Telehealth: Payer: Self-pay | Admitting: *Deleted

## 2011-06-27 NOTE — Telephone Encounter (Signed)
I spoke with patient and made him aware the his [name] has arrived in the office and is being placed at the front desk.

## 2011-08-16 ENCOUNTER — Encounter: Payer: Self-pay | Admitting: Cardiology

## 2011-09-13 ENCOUNTER — Other Ambulatory Visit: Payer: Self-pay | Admitting: Internal Medicine

## 2011-09-13 NOTE — Telephone Encounter (Signed)
Pt needs refill of [name], he can be reached at [phone].  Please call to advise when he can pick up in the office

## 2011-09-14 ENCOUNTER — Ambulatory Visit (INDEPENDENT_AMBULATORY_CARE_PROVIDER_SITE_OTHER): Payer: Medicare Other | Admitting: *Deleted

## 2011-09-14 ENCOUNTER — Encounter: Payer: Self-pay | Admitting: Internal Medicine

## 2011-09-14 DIAGNOSIS — I472 Ventricular tachycardia: Secondary | ICD-10-CM

## 2011-09-14 DIAGNOSIS — I4729 Other ventricular tachycardia: Secondary | ICD-10-CM

## 2011-09-14 MED ORDER — DOFETILIDE 500 MCG PO CAPS: 500.0000 ug | ORAL_CAPSULE | Freq: Two times a day (BID) | ORAL | Status: DC

## 2011-09-14 NOTE — Telephone Encounter (Signed)
[name] refilled through [employer] patient assistance. I will notify the patient once this has arrived.

## 2011-09-18 LAB — REMOTE ICD DEVICE
AL AMPLITUDE: 2.5 mv
AL IMPEDENCE ICD: 400 Ohm
ATRIAL PACING ICD: 87 pct
BAMS-0001: 150 {beats}/min
BAMS-0003: 70 {beats}/min
BATTERY VOLTAGE: 2.63 V
DEV-0020ICD: NEGATIVE
DEVICE MODEL ICD: 554225
MODE SWITCH EPISODES: 94
RV LEAD AMPLITUDE: 8.4 mv
RV LEAD IMPEDENCE ICD: 560 Ohm
TZAT-0001FASTVT: 1
TZAT-0004FASTVT: 8
TZAT-0012FASTVT: 200 ms
TZAT-0013FASTVT: 1
TZAT-0018FASTVT: NEGATIVE
TZAT-0019FASTVT: 7.5 V
TZAT-0020FASTVT: 1 ms
TZON-0003FASTVT: 350 ms
TZON-0003SLOWVT: 425 ms
TZON-0004FASTVT: 20
TZON-0004SLOWVT: 15
TZON-0005FASTVT: 6
TZON-0005SLOWVT: 6
TZON-0010FASTVT: 80 ms
TZON-0010SLOWVT: 80 ms
TZST-0001FASTVT: 2
TZST-0001FASTVT: 3
TZST-0001FASTVT: 4
TZST-0001FASTVT: 5
TZST-0003FASTVT: 30 J
TZST-0003FASTVT: 36 J
TZST-0003FASTVT: 36 J
TZST-0003FASTVT: 36 J
VENTRICULAR PACING ICD: 81 pct

## 2011-09-26 ENCOUNTER — Telehealth: Payer: Self-pay | Admitting: *Deleted

## 2011-09-26 NOTE — Telephone Encounter (Signed)
Pt called and was concerned that he has not been contacted about his [name] refill.  Patient ID # [id number], phone for [phone].  Caralee Ates, CMA  Called and spoke with Porfirio Mylar at [employer] Patient assistance, she stated that the order was shipped and delivered to [facility] on 09/18/2011 and signed by A. Montez Morita.  Tracking #[id number].  Went to the front desk, the Rx was received, I signed it out, and put it in the Rx pt pick up drawer along with putting it on the pt sign out sheet for pick up.  Caralee Ates, CMA  Called the pt and informed him his Rx was at the front desk waiting for him, pt stated he will pick up the Rx in the morning.  Caralee Ates, CMA

## 2011-10-03 ENCOUNTER — Encounter: Payer: Self-pay | Admitting: *Deleted

## 2011-10-10 ENCOUNTER — Other Ambulatory Visit: Payer: Self-pay | Admitting: Cardiology

## 2011-10-10 DIAGNOSIS — I4891 Unspecified atrial fibrillation: Secondary | ICD-10-CM

## 2011-10-10 MED ORDER — METOPROLOL SUCCINATE ER 25 MG PO TB24: 25.0000 mg | ORAL_TABLET | Freq: Every day | ORAL | Status: DC

## 2011-10-17 ENCOUNTER — Other Ambulatory Visit: Payer: Self-pay | Admitting: Internal Medicine

## 2011-10-17 DIAGNOSIS — E559 Vitamin D deficiency, unspecified: Secondary | ICD-10-CM | POA: Diagnosis not present

## 2011-10-17 DIAGNOSIS — E785 Hyperlipidemia, unspecified: Secondary | ICD-10-CM | POA: Diagnosis not present

## 2011-10-17 DIAGNOSIS — I4891 Unspecified atrial fibrillation: Secondary | ICD-10-CM

## 2011-10-17 DIAGNOSIS — E119 Type 2 diabetes mellitus without complications: Secondary | ICD-10-CM | POA: Diagnosis not present

## 2011-10-17 MED ORDER — METOPROLOL SUCCINATE ER 25 MG PO TB24: 25.0000 mg | ORAL_TABLET | Freq: Every day | ORAL | Status: DC

## 2011-10-17 NOTE — Telephone Encounter (Signed)
Pt needs refill asap

## 2011-10-17 NOTE — Telephone Encounter (Signed)
Pt called and stated his Metoprolol Succinate (TOPROLOL-XL) 25 MG qd - 90 day supply had not been sent into [facility].  Caralee Ates, CMA  Called [facility], they had not received e-scribe refill.  I was transferred to the pharmacy where I spoke with Thayer Ohm a pharmacist he placed the Metoprolol Succinate (TOPROLOL-XL) 25 MG qd - 90 day supply 3 refills in for the pt.  Caralee Ates, CMA

## 2011-10-19 DIAGNOSIS — E559 Vitamin D deficiency, unspecified: Secondary | ICD-10-CM | POA: Diagnosis not present

## 2011-10-19 DIAGNOSIS — I2510 Atherosclerotic heart disease of native coronary artery without angina pectoris: Secondary | ICD-10-CM | POA: Diagnosis not present

## 2011-10-19 DIAGNOSIS — E1169 Type 2 diabetes mellitus with other specified complication: Secondary | ICD-10-CM | POA: Diagnosis not present

## 2011-10-19 DIAGNOSIS — E11339 Type 2 diabetes mellitus with moderate nonproliferative diabetic retinopathy without macular edema: Secondary | ICD-10-CM | POA: Diagnosis not present

## 2011-10-19 DIAGNOSIS — Z23 Encounter for immunization: Secondary | ICD-10-CM | POA: Diagnosis not present

## 2011-10-19 DIAGNOSIS — E785 Hyperlipidemia, unspecified: Secondary | ICD-10-CM | POA: Diagnosis not present

## 2011-10-19 DIAGNOSIS — E1139 Type 2 diabetes mellitus with other diabetic ophthalmic complication: Secondary | ICD-10-CM | POA: Diagnosis not present

## 2011-12-04 DIAGNOSIS — H35379 Puckering of macula, unspecified eye: Secondary | ICD-10-CM | POA: Diagnosis not present

## 2011-12-04 DIAGNOSIS — E11339 Type 2 diabetes mellitus with moderate nonproliferative diabetic retinopathy without macular edema: Secondary | ICD-10-CM | POA: Diagnosis not present

## 2011-12-04 DIAGNOSIS — H35369 Drusen (degenerative) of macula, unspecified eye: Secondary | ICD-10-CM | POA: Diagnosis not present

## 2011-12-04 DIAGNOSIS — E1139 Type 2 diabetes mellitus with other diabetic ophthalmic complication: Secondary | ICD-10-CM | POA: Diagnosis not present

## 2011-12-04 DIAGNOSIS — H43819 Vitreous degeneration, unspecified eye: Secondary | ICD-10-CM | POA: Diagnosis not present

## 2011-12-08 ENCOUNTER — Encounter: Payer: Self-pay | Admitting: Internal Medicine

## 2011-12-08 ENCOUNTER — Ambulatory Visit (INDEPENDENT_AMBULATORY_CARE_PROVIDER_SITE_OTHER): Payer: Medicare Other | Admitting: Internal Medicine

## 2011-12-08 VITALS — BP 111/68 | HR 91 | Resp 18 | Ht 73.0 in | Wt 206.1 lb

## 2011-12-08 DIAGNOSIS — I2510 Atherosclerotic heart disease of native coronary artery without angina pectoris: Secondary | ICD-10-CM | POA: Diagnosis not present

## 2011-12-08 DIAGNOSIS — I472 Ventricular tachycardia: Secondary | ICD-10-CM

## 2011-12-08 DIAGNOSIS — I4891 Unspecified atrial fibrillation: Secondary | ICD-10-CM | POA: Diagnosis not present

## 2011-12-08 DIAGNOSIS — I4729 Other ventricular tachycardia: Secondary | ICD-10-CM

## 2011-12-08 DIAGNOSIS — Z95810 Presence of automatic (implantable) cardiac defibrillator: Secondary | ICD-10-CM | POA: Diagnosis not present

## 2011-12-08 LAB — ICD DEVICE OBSERVATION
AL AMPLITUDE: 3.8 mv
AL IMPEDENCE ICD: 412.5 Ohm
AL THRESHOLD: 0.75 V
ATRIAL PACING ICD: 86 pct
BAMS-0001: 150 {beats}/min
BAMS-0003: 70 {beats}/min
BATTERY VOLTAGE: 2.6019 V
CHARGE TIME: 13.6 s
DEV-0020ICD: NEGATIVE
DEVICE MODEL ICD: 554225
FVT: 0
HV IMPEDENCE: 44 Ohm
MODE SWITCH EPISODES: 525
PACEART VT: 0
RV LEAD AMPLITUDE: 8.6 mv
RV LEAD IMPEDENCE ICD: 587.5 Ohm
RV LEAD THRESHOLD: 0.5 V
TOT-0006: 20100519000000
TOT-0007: 1
TOT-0008: 0
TOT-0009: 1
TOT-0010: 30
TZAT-0001FASTVT: 1
TZAT-0004FASTVT: 8
TZAT-0012FASTVT: 200 ms
TZAT-0013FASTVT: 1
TZAT-0018FASTVT: NEGATIVE
TZAT-0019FASTVT: 7.5 V
TZAT-0020FASTVT: 1 ms
TZON-0003FASTVT: 350 ms
TZON-0003SLOWVT: 425 ms
TZON-0004FASTVT: 20
TZON-0004SLOWVT: 15
TZON-0005FASTVT: 6
TZON-0005SLOWVT: 6
TZON-0010FASTVT: 80 ms
TZON-0010SLOWVT: 80 ms
TZST-0001FASTVT: 2
TZST-0001FASTVT: 3
TZST-0001FASTVT: 4
TZST-0001FASTVT: 5
TZST-0003FASTVT: 30 J
TZST-0003FASTVT: 36 J
TZST-0003FASTVT: 36 J
TZST-0003FASTVT: 36 J
VENTRICULAR PACING ICD: 82 pct
VF: 2

## 2011-12-08 LAB — BASIC METABOLIC PANEL
BUN: 16 mg/dL (ref 6–23)
CO2: 30 mEq/L (ref 19–32)
Calcium: 9.1 mg/dL (ref 8.4–10.5)
Chloride: 102 mEq/L (ref 96–112)
Creatinine, Ser: 1.1 mg/dL (ref 0.4–1.5)
GFR: 72.57 mL/min (ref >60.00)
Glucose, Bld: 157 mg/dL — ABNORMAL HIGH (ref 70–99)
Potassium: 4.1 mEq/L (ref 3.5–5.1)
Sodium: 137 mEq/L (ref 135–145)

## 2011-12-08 LAB — MAGNESIUM: Magnesium: 2 mg/dL (ref 1.5–2.5)

## 2011-12-08 MED ORDER — METOPROLOL SUCCINATE ER 50 MG PO TB24: 50.0000 mg | ORAL_TABLET | Freq: Every day | ORAL | Status: DC

## 2011-12-08 NOTE — Assessment & Plan Note (Signed)
No intercurrent afib 

## 2011-12-08 NOTE — Patient Instructions (Signed)
Labs today:  BMET and Magnesium.  Increase Metoprolol to 50mg  daily.  Your physician recommends that you schedule a follow-up appointment in: 6 weeks with Dr. Graciela Husbands.

## 2011-12-08 NOTE — Assessment & Plan Note (Signed)
The patient's device was interrogated.  The information was reviewed. No changes were made in the programming.    

## 2011-12-08 NOTE — Assessment & Plan Note (Signed)
Lab work was reviewed. Metabolic parameters and magnesium were not checked. We will do that. I don't have an obvious explanation for why he's had 2 episodes in the last 24 months both in the last 3 or 4 weeks. Certainly let mechanical irritability is an issue and there could be mild ventricular dilatation for which there are no associated symptoms. Electrolyte issues are possibility of ischemia is also a concern. We will increase his beta blockers gently although this was previously associated with orthostatic intolerance. He will take it at night. We'll plan to review the situation 4-6 weeks from now ; hopefully that will give [name] sufficient enough window to know whether we are making headway

## 2011-12-08 NOTE — Progress Notes (Signed)
Patient has no care team.   HPI  [name] is a 78 y.o. male Seen in followup for polymorphic ventricular tachycardia for which he has an ICD and atrial fibrillation.  He also has a history of coronary disease with prior CABG mitral valve repair both of which were done at [facility] clinic at that time. he also had a PFO closure.   Is on dofetilide for his arrhythmias. He had no intercurrent arrhythmias of note for about 2 years until last month when he had 2.    He denies intercurrent changes in exercise tolerance; there has been no chest pain. Blood work was checked by Dr. Kyra Searles but I don't see evidence of his K/Mg having been drawn.      Past Medical History  Diagnosis Date  . [place] artery [place]     [place]  . Ventricular tachycardia, polymorphic     status post ICD implantation  . Raynaud's disease   . ICD (implantable cardiac defibrillator) in place   . Diabetes mellitus   . Endocarditis, valve unspecified     Mitral  . PAF (paroxysmal atrial fibrillation)   . PVC's (premature ventricular contractions)   . History of seasonal allergies   . History of migraine headaches   . Pleural effusion     Past Surgical History  Procedure Date  . Mitral valve replacement   . Coronary artery bypass graft 2001    2 VESSEL CABG AND MITRAL VALVE REPAIR. HE HAD A LIMA GRAFT TO THE LAD AND RADIAL ARTERY  GRAFT TO THE OBTUSE MARGINAL  OF LEFT CIRCUMFLEX  . Cardiac defibrillator placement   . Hemorroidectomy   . Knee arthroscopy     RIGHT KNEE  . Cardiac catheterization 04/03/2007    EF 40%  . Cardiac catheterization 02/06/2006    EF 45%. ANTERIOR HYPOKINESIS  . Cardiac catheterization 05/29/2000    EF 50%. MILD ANTERIOR HYPOKINESIS  . Cardiac catheterization 10/25/1999    EF 50%. SEVERE MITRAL REGURGITATION  . Cardiac catheterization 01/06/2003    EF 50%  . Transthoracic echocardiogram 04/02/2007    EF 35%    Current Outpatient Prescriptions   Medication Sig Dispense Refill  . aspirin 81 MG tablet Take 81 mg by mouth daily.        Marland Kitchen atorvastatin (LIPITOR) 80 MG tablet Take 1 tablet by mouth Daily.      . Cholecalciferol (VITAMIN D-3) 5000 UNITS TABS Take by mouth daily.        Marland Kitchen dofetilide ([name]) 500 MCG capsule Take 1 capsule (500 mcg total) by mouth 2 (two) times daily.  180 capsule  0  . Magnesium 250 MG TABS Take by mouth daily.        . metoprolol succinate (TOPROL-XL) 25 MG 24 hr tablet Take 1 tablet (25 mg total) by mouth daily.  90 tablet  3  . Multiple Vitamins-Minerals (OCUVITE-LUTEIN PO) Take by mouth daily.        . nateglinide (STARLIX) 120 MG tablet Take 1 tablet by mouth Twice daily.      . pioglitazone (ACTOS) 45 MG tablet Take 45 mg by mouth daily.        . riboflavin (VITAMIN B-2) 100 MG TABS Take 100 mg by mouth daily.        . sitaGLIPtin (JANUVIA) 100 MG tablet Take 100 mg by mouth daily.        Marland Kitchen amoxicillin (AMOXIL) 500 MG capsule       .  Multiple Vitamins-Minerals (ZINC PO) Take by mouth daily.          Allergies  Allergen Reactions  . Sotalol   . Warfarin Sodium     REACTION: migraine headaches/vision impariment    Review of Systems negative except from HPI and PMH  Physical Exam BP 111/68  Pulse 91  Resp 18  Ht 6\' 1"  (1.854 m)  Wt 206 lb 1.9 oz (93.495 kg)  BMI 27.19 kg/m2  SpO2 97% Well developed and well nourished in no acute distress HENT normal E scleral and icterus clear Neck Supple JVP flat; carotids brisk and full Clear to ausculation  Regular rate and rhythm, no murmurs gallops or rub Soft with active bowel sounds No clubbing cyanosis none Edema Alert and oriented, grossly normal motor and sensory function Skin Warm and Dry  The echocardiogram demonstrates AV pacing   Assessment and  Plan

## 2011-12-11 NOTE — Addendum Note (Signed)
Addended by: Waymon Budge on: 12/11/2011 02:08 PM   Modules accepted: Orders

## 2011-12-26 ENCOUNTER — Telehealth: Payer: Self-pay | Admitting: Internal Medicine

## 2011-12-26 DIAGNOSIS — I4891 Unspecified atrial fibrillation: Secondary | ICD-10-CM

## 2011-12-26 MED ORDER — METOPROLOL SUCCINATE ER 50 MG PO TB24: 50.0000 mg | ORAL_TABLET | Freq: Every day | ORAL | Status: DC

## 2011-12-26 NOTE — Telephone Encounter (Signed)
Spoke to patient he stated he needed refill on metoprolol [facility].Metoprolol Succ. 50 mg daily sent [facility].

## 2011-12-26 NOTE — Telephone Encounter (Signed)
plz return call to pt at [phone] regarding Metoprolol RX

## 2011-12-29 ENCOUNTER — Telehealth: Payer: Self-pay | Admitting: Internal Medicine

## 2011-12-29 DIAGNOSIS — I4891 Unspecified atrial fibrillation: Secondary | ICD-10-CM

## 2011-12-29 MED ORDER — METOPROLOL SUCCINATE ER 50 MG PO TB24: 50.0000 mg | ORAL_TABLET | Freq: Every day | ORAL | Status: DC

## 2011-12-29 NOTE — Telephone Encounter (Signed)
New Problem:    Patient called in needing a refill of his metoprolol succinate (TOPROL-XL) 50 MG 24 hr tablet called into phone: [phone] fax: [phone].  Please call back

## 2011-12-29 NOTE — Telephone Encounter (Signed)
Patient called no answer.Left message on personal voice mail metoprolol succ.prescription sent to pharmacy.

## 2012-01-02 ENCOUNTER — Encounter: Payer: Self-pay | Admitting: Internal Medicine

## 2012-01-02 ENCOUNTER — Ambulatory Visit (INDEPENDENT_AMBULATORY_CARE_PROVIDER_SITE_OTHER): Payer: Medicare Other | Admitting: Internal Medicine

## 2012-01-02 VITALS — BP 114/75 | HR 69 | Ht 74.0 in | Wt 215.0 lb

## 2012-01-02 DIAGNOSIS — I2510 Atherosclerotic heart disease of native coronary artery without angina pectoris: Secondary | ICD-10-CM | POA: Diagnosis not present

## 2012-01-02 DIAGNOSIS — I4891 Unspecified atrial fibrillation: Secondary | ICD-10-CM

## 2012-01-02 DIAGNOSIS — I4729 Other ventricular tachycardia: Secondary | ICD-10-CM

## 2012-01-02 DIAGNOSIS — I472 Ventricular tachycardia: Secondary | ICD-10-CM

## 2012-01-02 LAB — ICD DEVICE OBSERVATION
AL AMPLITUDE: 3.2 mv
AL IMPEDENCE ICD: 412.5 Ohm
ATRIAL PACING ICD: 93 pct
BAMS-0001: 150 {beats}/min
BAMS-0003: 70 {beats}/min
BATTERY VOLTAGE: 2.5869 V
DEV-0020ICD: NEGATIVE
DEVICE MODEL ICD: 554225
FVT: 0
MODE SWITCH EPISODES: 21
PACEART VT: 0
RV LEAD AMPLITUDE: 9.7 mv
RV LEAD IMPEDENCE ICD: 575 Ohm
TOT-0006: 20100519000000
TOT-0007: 1
TOT-0008: 0
TOT-0009: 1
TOT-0010: 31
TZAT-0001FASTVT: 1
TZAT-0004FASTVT: 8
TZAT-0012FASTVT: 200 ms
TZAT-0013FASTVT: 1
TZAT-0018FASTVT: NEGATIVE
TZAT-0019FASTVT: 7.5 V
TZAT-0020FASTVT: 1 ms
TZON-0003FASTVT: 350 ms
TZON-0003SLOWVT: 425 ms
TZON-0004FASTVT: 20
TZON-0004SLOWVT: 15
TZON-0005FASTVT: 6
TZON-0005SLOWVT: 6
TZON-0010FASTVT: 80 ms
TZON-0010SLOWVT: 80 ms
TZST-0001FASTVT: 2
TZST-0001FASTVT: 3
TZST-0001FASTVT: 4
TZST-0001FASTVT: 5
TZST-0003FASTVT: 30 J
TZST-0003FASTVT: 36 J
TZST-0003FASTVT: 36 J
TZST-0003FASTVT: 36 J
VENTRICULAR PACING ICD: 92 pct
VF: 0

## 2012-01-02 NOTE — Patient Instructions (Signed)
Remote monitoring is used to monitor your Pacemaker of ICD from home. This monitoring reduces the number of office visits required to check your device to one time per year. It allows [name] to keep an eye on the functioning of your device to ensure it is working properly. You are scheduled for a device check from home on 04/08/2012. You may send your transmission at any time that day. If you have a wireless device, the transmission will be sent automatically. After your physician reviews your transmission, you will receive a postcard with your next transmission date.  Your physician wants you to follow-up in: 4 months with Dr. Graciela Husbands. You will receive a reminder letter in the mail two months in advance. If you don't receive a letter, please call our office to schedule the follow-up appointment.

## 2012-01-02 NOTE — Progress Notes (Signed)
[name] Patient Care Team: Lenora Boys, MD as PCP - General (Family Medicine)   HPI  [name] is a 78 y.o. male Seen in followup for polymorphic ventricular tachycardia for which he has an ICD and atrial fibrillation. He was seen November/13 because of 2 episodes of polymorphic ventricular tachycardia occurring in the preceding few weeks after having been quiet for months and months. There is no clear inciting event. Beta blocker therapy was  gently uptitrated    He also has a history of coronary disease with prior CABG mitral valve repair both of which were done at [facility] clinic at that time. he also had a PFO closure.  Is on dofetilide for his arrhythmias. He had no intercurrent arrhythmias of note for about 2 years until last month when he had 2.  He denies intercurrent changes in exercise tolerance; there has been no chest pain. Blood work was checked by Dr. Kyra Searles but I don't see evidence of his K/Mg having been drawn.      Past Medical History  Diagnosis Date  . Coronary artery disease     [place] [facility] [facility]  . Ventricular tachycardia, polymorphic     status post ICD implantation  . Raynaud's disease   . ICD (implantable cardiac defibrillator) in place   . Diabetes mellitus   . Endocarditis, valve unspecified     Mitral  . PAF (paroxysmal atrial fibrillation)   . PVC's (premature ventricular contractions)   . History of seasonal allergies   . History of migraine headaches   . Pleural effusion     Past Surgical History  Procedure Date  . Mitral valve replacement   . Coronary artery bypass graft 2001    2 VESSEL CABG AND MITRAL VALVE REPAIR. HE HAD A LIMA GRAFT TO THE LAD AND RADIAL ARTERY  GRAFT TO THE OBTUSE MARGINAL  OF LEFT CIRCUMFLEX  . Cardiac defibrillator placement   . Hemorroidectomy   . Knee arthroscopy     RIGHT KNEE  . Cardiac catheterization 04/03/2007    EF 40%  . Cardiac catheterization 02/06/2006    EF 45%. ANTERIOR  HYPOKINESIS  . Cardiac catheterization 05/29/2000    EF 50%. MILD ANTERIOR HYPOKINESIS  . Cardiac catheterization 10/25/1999    EF 50%. SEVERE MITRAL REGURGITATION  . Cardiac catheterization 01/06/2003    EF 50%  . Transthoracic echocardiogram 04/02/2007    EF 35%    Current Outpatient Prescriptions  Medication Sig Dispense Refill  . amoxicillin (AMOXIL) 500 MG capsule       . aspirin 81 MG tablet Take 81 mg by mouth daily.        Marland Kitchen atorvastatin (LIPITOR) 80 MG tablet Take 1 tablet by mouth Daily.      . Cholecalciferol (VITAMIN D-3) 5000 UNITS TABS Take by mouth daily.        Marland Kitchen dofetilide ([name]) 500 MCG capsule Take 1 capsule (500 mcg total) by mouth 2 (two) times daily.  180 capsule  0  . Magnesium 250 MG TABS Take by mouth daily.        . metoprolol succinate (TOPROL-XL) 50 MG 24 hr tablet Take 1 tablet (50 mg total) by mouth daily.  90 tablet  3  . Multiple Vitamins-Minerals (OCUVITE-LUTEIN PO) Take by mouth daily.        . nateglinide (STARLIX) 120 MG tablet Take 1 tablet by mouth Twice daily.      . pioglitazone (ACTOS) 45 MG tablet Take 45 mg by mouth  daily.        . sitaGLIPtin (JANUVIA) 100 MG tablet Take 100 mg by mouth daily.          Allergies  Allergen Reactions  . Sotalol   . Warfarin Sodium     REACTION: migraine headaches/vision impariment    Review of Systems negative except from HPI and PMH  Physical Exam BP 114/75  Pulse 69  Ht 6\' 2"  (1.88 m)  Wt 215 lb (97.523 kg)  BMI 27.60 kg/m2 Well developed and well nourished in no acute distress HENT normal E scleral and icterus clear Neck Supple JVP flat; carotids brisk and full Clear to ausculation  Regular rate and rhythm, no murmurs gallops or rub Soft with active bowel sounds No clubbing cyanosis none Edema Alert and oriented, grossly normal motor and sensory function Skin Warm and Dry    Assessment and  Plan

## 2012-01-02 NOTE — Assessment & Plan Note (Signed)
Some intercurrent atrial fibrillation but this is relatively infrequent. We will need to think again about anticoagulants.

## 2012-01-02 NOTE — Assessment & Plan Note (Signed)
No recurrent ventricular tachycardia. He is tolerating the higher dose of metoprolol.

## 2012-01-02 NOTE — Assessment & Plan Note (Signed)
Stable without chest pain 

## 2012-02-08 ENCOUNTER — Encounter: Payer: Self-pay | Admitting: Internal Medicine

## 2012-03-11 ENCOUNTER — Encounter: Payer: Self-pay | Admitting: Internal Medicine

## 2012-03-11 ENCOUNTER — Other Ambulatory Visit: Payer: Self-pay

## 2012-03-11 ENCOUNTER — Ambulatory Visit (INDEPENDENT_AMBULATORY_CARE_PROVIDER_SITE_OTHER): Payer: Medicare Other | Admitting: *Deleted

## 2012-03-11 DIAGNOSIS — I4729 Other ventricular tachycardia: Secondary | ICD-10-CM

## 2012-03-11 DIAGNOSIS — Z95810 Presence of automatic (implantable) cardiac defibrillator: Secondary | ICD-10-CM

## 2012-03-11 DIAGNOSIS — I472 Ventricular tachycardia: Secondary | ICD-10-CM

## 2012-03-15 LAB — REMOTE ICD DEVICE
AL AMPLITUDE: 2.1 mv
AL IMPEDENCE ICD: 400 Ohm
ATRIAL PACING ICD: 92 pct
BAMS-0001: 150 {beats}/min
BAMS-0003: 70 {beats}/min
BATTERY VOLTAGE: 2.59 V
DEV-0020ICD: NEGATIVE
DEVICE MODEL ICD: 554225
HV IMPEDENCE: 44 Ohm
MODE SWITCH EPISODES: 31
RV LEAD AMPLITUDE: 8.8 mv
RV LEAD IMPEDENCE ICD: 600 Ohm
TZAT-0001FASTVT: 1
TZAT-0004FASTVT: 8
TZAT-0012FASTVT: 200 ms
TZAT-0013FASTVT: 1
TZAT-0018FASTVT: NEGATIVE
TZAT-0019FASTVT: 7.5 V
TZAT-0020FASTVT: 1 ms
TZON-0003FASTVT: 350 ms
TZON-0003SLOWVT: 425 ms
TZON-0004FASTVT: 20
TZON-0004SLOWVT: 15
TZON-0005FASTVT: 6
TZON-0005SLOWVT: 6
TZON-0010FASTVT: 80 ms
TZON-0010SLOWVT: 80 ms
TZST-0001FASTVT: 2
TZST-0001FASTVT: 3
TZST-0001FASTVT: 4
TZST-0001FASTVT: 5
TZST-0003FASTVT: 30 J
TZST-0003FASTVT: 36 J
TZST-0003FASTVT: 36 J
TZST-0003FASTVT: 36 J
VENTRICULAR PACING ICD: 90 pct

## 2012-03-16 ENCOUNTER — Other Ambulatory Visit: Payer: Self-pay

## 2012-03-18 ENCOUNTER — Telehealth: Payer: Self-pay | Admitting: Internal Medicine

## 2012-03-18 NOTE — Telephone Encounter (Signed)
New Problem   Pt has question about appt. Would like to speak to someone.

## 2012-03-18 NOTE — Telephone Encounter (Signed)
Pt received letter thru [facility] stating no showed appt which was remote check. Remote was actually received on 03-11-12. Pt was instructed transmission was received and next remote was 06-10-12.

## 2012-03-26 ENCOUNTER — Encounter: Payer: Self-pay | Admitting: *Deleted

## 2012-03-27 ENCOUNTER — Telehealth: Payer: Self-pay | Admitting: Internal Medicine

## 2012-03-27 ENCOUNTER — Other Ambulatory Visit: Payer: Self-pay

## 2012-03-27 MED ORDER — DOFETILIDE 500 MCG PO CAPS: 500.0000 ug | ORAL_CAPSULE | Freq: Two times a day (BID) | ORAL | Status: DC

## 2012-03-27 NOTE — Telephone Encounter (Signed)
[facility] refill sent to [facility] per pt request.  He was notified of this.

## 2012-03-27 NOTE — Telephone Encounter (Signed)
New Prob      Requesting call in for tikosyn to [facility]. States its a new prescription. Would like to speak to nurse.

## 2012-04-17 ENCOUNTER — Encounter: Payer: Self-pay | Admitting: Internal Medicine

## 2012-04-17 DIAGNOSIS — E559 Vitamin D deficiency, unspecified: Secondary | ICD-10-CM | POA: Diagnosis not present

## 2012-04-17 DIAGNOSIS — E785 Hyperlipidemia, unspecified: Secondary | ICD-10-CM | POA: Diagnosis not present

## 2012-04-17 DIAGNOSIS — E119 Type 2 diabetes mellitus without complications: Secondary | ICD-10-CM | POA: Diagnosis not present

## 2012-04-17 DIAGNOSIS — I10 Essential (primary) hypertension: Secondary | ICD-10-CM | POA: Diagnosis not present

## 2012-04-19 DIAGNOSIS — E785 Hyperlipidemia, unspecified: Secondary | ICD-10-CM | POA: Diagnosis not present

## 2012-04-19 DIAGNOSIS — E1139 Type 2 diabetes mellitus with other diabetic ophthalmic complication: Secondary | ICD-10-CM | POA: Diagnosis not present

## 2012-04-19 DIAGNOSIS — E559 Vitamin D deficiency, unspecified: Secondary | ICD-10-CM | POA: Diagnosis not present

## 2012-04-19 DIAGNOSIS — I2510 Atherosclerotic heart disease of native coronary artery without angina pectoris: Secondary | ICD-10-CM | POA: Diagnosis not present

## 2012-04-19 DIAGNOSIS — E1169 Type 2 diabetes mellitus with other specified complication: Secondary | ICD-10-CM | POA: Diagnosis not present

## 2012-04-19 DIAGNOSIS — E11339 Type 2 diabetes mellitus with moderate nonproliferative diabetic retinopathy without macular edema: Secondary | ICD-10-CM | POA: Diagnosis not present

## 2012-05-13 DIAGNOSIS — D045 Carcinoma in situ of skin of trunk: Secondary | ICD-10-CM | POA: Diagnosis not present

## 2012-05-13 DIAGNOSIS — L570 Actinic keratosis: Secondary | ICD-10-CM | POA: Diagnosis not present

## 2012-05-13 DIAGNOSIS — L821 Other seborrheic keratosis: Secondary | ICD-10-CM | POA: Diagnosis not present

## 2012-05-13 DIAGNOSIS — D1801 Hemangioma of skin and subcutaneous tissue: Secondary | ICD-10-CM | POA: Diagnosis not present

## 2012-05-13 DIAGNOSIS — D485 Neoplasm of uncertain behavior of skin: Secondary | ICD-10-CM | POA: Diagnosis not present

## 2012-06-10 ENCOUNTER — Ambulatory Visit (INDEPENDENT_AMBULATORY_CARE_PROVIDER_SITE_OTHER): Payer: Medicare Other | Admitting: *Deleted

## 2012-06-10 ENCOUNTER — Other Ambulatory Visit: Payer: Self-pay

## 2012-06-10 ENCOUNTER — Encounter: Payer: Self-pay | Admitting: Internal Medicine

## 2012-06-10 DIAGNOSIS — I472 Ventricular tachycardia: Secondary | ICD-10-CM

## 2012-06-10 DIAGNOSIS — Z95810 Presence of automatic (implantable) cardiac defibrillator: Secondary | ICD-10-CM | POA: Diagnosis not present

## 2012-06-10 DIAGNOSIS — I4729 Other ventricular tachycardia: Secondary | ICD-10-CM

## 2012-06-10 LAB — REMOTE ICD DEVICE
AL AMPLITUDE: 2.5 mv
AL IMPEDENCE ICD: 410 Ohm
ATRIAL PACING ICD: 90 pct
BAMS-0001: 150 {beats}/min
BAMS-0003: 70 {beats}/min
BATTERY VOLTAGE: 2.57 V
DEV-0020ICD: NEGATIVE
DEVICE MODEL ICD: 554225
HV IMPEDENCE: 42 Ohm
MODE SWITCH EPISODES: 101
RV LEAD AMPLITUDE: 8.8 mv
RV LEAD IMPEDENCE ICD: 530 Ohm
TZAT-0001FASTVT: 1
TZAT-0004FASTVT: 8
TZAT-0012FASTVT: 200 ms
TZAT-0013FASTVT: 1
TZAT-0018FASTVT: NEGATIVE
TZAT-0019FASTVT: 7.5 V
TZAT-0020FASTVT: 1 ms
TZON-0003FASTVT: 350 ms
TZON-0003SLOWVT: 425 ms
TZON-0004FASTVT: 20
TZON-0004SLOWVT: 15
TZON-0005FASTVT: 6
TZON-0005SLOWVT: 6
TZON-0010FASTVT: 80 ms
TZON-0010SLOWVT: 80 ms
TZST-0001FASTVT: 2
TZST-0001FASTVT: 3
TZST-0001FASTVT: 4
TZST-0001FASTVT: 5
TZST-0003FASTVT: 30 J
TZST-0003FASTVT: 36 J
TZST-0003FASTVT: 36 J
TZST-0003FASTVT: 36 J
VENTRICULAR PACING ICD: 85 pct

## 2012-06-27 ENCOUNTER — Encounter: Payer: Self-pay | Admitting: *Deleted

## 2012-07-01 ENCOUNTER — Other Ambulatory Visit: Payer: Self-pay | Admitting: *Deleted

## 2012-07-01 MED ORDER — DOFETILIDE 500 MCG PO CAPS: 500.0000 ug | ORAL_CAPSULE | Freq: Two times a day (BID) | ORAL | Status: DC

## 2012-07-03 ENCOUNTER — Encounter: Payer: Self-pay | Admitting: Internal Medicine

## 2012-07-03 ENCOUNTER — Ambulatory Visit (INDEPENDENT_AMBULATORY_CARE_PROVIDER_SITE_OTHER): Payer: Medicare Other | Admitting: Internal Medicine

## 2012-07-03 ENCOUNTER — Encounter (HOSPITAL_COMMUNITY): Payer: Self-pay | Admitting: Pharmacy Technician

## 2012-07-03 ENCOUNTER — Encounter: Payer: Self-pay | Admitting: *Deleted

## 2012-07-03 VITALS — BP 120/67 | HR 69 | Ht 74.0 in | Wt 215.0 lb

## 2012-07-03 DIAGNOSIS — I472 Ventricular tachycardia: Secondary | ICD-10-CM

## 2012-07-03 DIAGNOSIS — I2510 Atherosclerotic heart disease of native coronary artery without angina pectoris: Secondary | ICD-10-CM

## 2012-07-03 DIAGNOSIS — I4891 Unspecified atrial fibrillation: Secondary | ICD-10-CM

## 2012-07-03 DIAGNOSIS — I4729 Other ventricular tachycardia: Secondary | ICD-10-CM

## 2012-07-03 DIAGNOSIS — Z95810 Presence of automatic (implantable) cardiac defibrillator: Secondary | ICD-10-CM

## 2012-07-03 LAB — ICD DEVICE OBSERVATION
AL AMPLITUDE: 2.6 mv
AL IMPEDENCE ICD: 425 Ohm
AL THRESHOLD: 0.75 V
ATRIAL PACING ICD: 90 pct
BAMS-0001: 150 {beats}/min
BAMS-0003: 70 {beats}/min
BATTERY VOLTAGE: 2.5718 V
CHARGE TIME: 13.9 s
DEV-0020ICD: NEGATIVE
DEVICE MODEL ICD: 554225
FVT: 0
HV IMPEDENCE: 43 Ohm
MODE SWITCH EPISODES: 113
PACEART VT: 0
RV LEAD AMPLITUDE: 9.8 mv
RV LEAD IMPEDENCE ICD: 525 Ohm
RV LEAD THRESHOLD: 0.5 V
TOT-0006: 20100519000000
TOT-0007: 1
TOT-0008: 0
TOT-0009: 1
TOT-0010: 33
TZAT-0001FASTVT: 1
TZAT-0004FASTVT: 8
TZAT-0012FASTVT: 200 ms
TZAT-0013FASTVT: 1
TZAT-0018FASTVT: NEGATIVE
TZAT-0019FASTVT: 7.5 V
TZAT-0020FASTVT: 1 ms
TZON-0003FASTVT: 350 ms
TZON-0003SLOWVT: 425 ms
TZON-0004FASTVT: 20
TZON-0004SLOWVT: 15
TZON-0005FASTVT: 6
TZON-0005SLOWVT: 6
TZON-0010FASTVT: 80 ms
TZON-0010SLOWVT: 80 ms
TZST-0001FASTVT: 2
TZST-0001FASTVT: 3
TZST-0001FASTVT: 4
TZST-0001FASTVT: 5
TZST-0003FASTVT: 30 J
TZST-0003FASTVT: 36 J
TZST-0003FASTVT: 36 J
TZST-0003FASTVT: 36 J
VENTRICULAR PACING ICD: 85 pct
VF: 1

## 2012-07-03 LAB — BASIC METABOLIC PANEL
BUN: 15 mg/dL (ref 6–23)
CO2: 27 mEq/L (ref 19–32)
Calcium: 9.2 mg/dL (ref 8.4–10.5)
Chloride: 103 mEq/L (ref 96–112)
Creatinine, Ser: 1 mg/dL (ref 0.4–1.5)
GFR: 78.47 mL/min (ref >60.00)
Glucose, Bld: 131 mg/dL — ABNORMAL HIGH (ref 70–99)
Potassium: 4.5 mEq/L (ref 3.5–5.1)
Sodium: 139 mEq/L (ref 135–145)

## 2012-07-03 LAB — CBC WITH DIFFERENTIAL/PLATELET
Basophils Absolute: 0 10*3/uL (ref 0.0–0.1)
Basophils Relative: 0.7 % (ref 0.0–3.0)
Eosinophils Absolute: 0.2 10*3/uL (ref 0.0–0.7)
Eosinophils Relative: 5.1 % — ABNORMAL HIGH (ref 0.0–5.0)
HCT: 42.6 % (ref 39.0–52.0)
Hemoglobin: 14.6 g/dL (ref 13.0–17.0)
Lymphocytes Relative: 20.6 % (ref 12.0–46.0)
Lymphs Abs: 0.9 10*3/uL (ref 0.7–4.0)
MCHC: 34.2 g/dL (ref 30.0–36.0)
MCV: 98.9 fl (ref 78.0–100.0)
Monocytes Absolute: 0.5 10*3/uL (ref 0.1–1.0)
Monocytes Relative: 12.6 % — ABNORMAL HIGH (ref 3.0–12.0)
Neutro Abs: 2.6 10*3/uL (ref 1.4–7.7)
Neutrophils Relative %: 61 % (ref 43.0–77.0)
Platelets: 136 10*3/uL — ABNORMAL LOW (ref 150.0–400.0)
RBC: 4.31 Mil/uL (ref 4.22–5.81)
RDW: 13.4 % (ref 11.5–14.6)
WBC: 4.2 10*3/uL — ABNORMAL LOW (ref 4.5–10.5)

## 2012-07-03 LAB — PROTIME-INR
INR: 1 ratio (ref 0.8–1.0)
Prothrombin Time: 10.9 s (ref 10.2–12.4)

## 2012-07-03 MED ORDER — ISOSORBIDE MONONITRATE ER 30 MG PO TB24: 30.0000 mg | ORAL_TABLET | Freq: Every day | ORAL | Status: DC

## 2012-07-03 NOTE — Assessment & Plan Note (Signed)
The patient's device was interrogated.  The information was reviewed. No changes were made in the programming.    

## 2012-07-03 NOTE — Patient Instructions (Addendum)
Your physician has requested that you have a cardiac catheterization. Cardiac catheterization is used to diagnose and/or treat various heart conditions. Doctors may recommend this procedure for a number of different reasons. The most common reason is to evaluate chest pain. Chest pain can be a symptom of coronary artery disease (CAD), and cardiac catheterization can show whether plaque is narrowing or blocking your heart's arteries. This procedure is also used to evaluate the valves, as well as measure the blood flow and oxygen levels in different parts of your heart. For further information please visit [email or web address] Please follow instruction sheet, as given.  Start new medication:  Imdur 30mg  daily

## 2012-07-03 NOTE — Assessment & Plan Note (Signed)
The patient has chest pain consistent with unstable and progressive angina.  It is accompanied by exertional dyspnea. We'll plan to undertake catheterization. We will schedule with Dr. Excell Seltzer with whom his wife has made an acquaintance was would like to have an established for long-term Gen. Cardiac care

## 2012-07-03 NOTE — Assessment & Plan Note (Signed)
noafib

## 2012-07-03 NOTE — Assessment & Plan Note (Signed)
No intercurrent Ventricular tachycardia  

## 2012-07-03 NOTE — Progress Notes (Signed)
Patient Care Team: Lenora Boys, MD as PCP - General (Family Medicine)   HPI  [name] is a 79 y.o. male  Seen in followup for polymorphic ventricular tachycardia for which he has an ICD and atrial fibrillation.  He was seen November/13 because of 2 episodes of polymorphic ventricular tachycardia occurring in the preceding few weeks after having been quiet for months and months. There is no clear inciting event. Beta blocker therapy was gently uptitrated    He also has a history of coronary disease with prior CABG mitral valve repair both of which were done at [facility] clinic at that time. he also had a PFO closure.  Is on dofetilide for his arrhythmias. He had no intercurrent arrhythmias of note for about 2 years until last month when he had 2.   Over recent weeks he has noted increasing and relatively abrupt onset worsening of shortness of breath with exertion. He is accompanied by chest discomfort that is relieved by rest. He has noted no accompanying edema.  Past Medical History  Diagnosis Date  . Coronary artery disease     [place] [facility] [facility]  . Ventricular tachycardia, polymorphic     status post ICD implantation  . Raynaud's disease   . ICD (implantable cardiac defibrillator) in place   . Diabetes mellitus   . Endocarditis, valve unspecified     Mitral  . PAF (paroxysmal atrial fibrillation)   . PVC's (premature ventricular contractions)   . History of seasonal allergies   . History of migraine headaches   . Pleural effusion     Past Surgical History  Procedure Laterality Date  . Mitral valve replacement    . Coronary artery bypass graft  2001    2 VESSEL CABG AND MITRAL VALVE REPAIR. HE HAD A LIMA GRAFT TO THE LAD AND RADIAL ARTERY  GRAFT TO THE OBTUSE MARGINAL  OF LEFT CIRCUMFLEX  . Cardiac defibrillator placement    . Hemorroidectomy    . Knee arthroscopy      RIGHT KNEE  . Cardiac catheterization  04/03/2007    EF 40%  . Cardiac  catheterization  02/06/2006    EF 45%. ANTERIOR HYPOKINESIS  . Cardiac catheterization  05/29/2000    EF 50%. MILD ANTERIOR HYPOKINESIS  . Cardiac catheterization  10/25/1999    EF 50%. SEVERE MITRAL REGURGITATION  . Cardiac catheterization  01/06/2003    EF 50%  . Transthoracic echocardiogram  04/02/2007    EF 35%    Current Outpatient Prescriptions  Medication Sig Dispense Refill  . aspirin 81 MG tablet Take 81 mg by mouth daily.        Marland Kitchen atorvastatin (LIPITOR) 80 MG tablet Take 1 tablet by mouth Daily.      . Cholecalciferol (VITAMIN D-3) 5000 UNITS TABS Take by mouth daily.        Marland Kitchen dofetilide ([name]) 500 MCG capsule Take 1 capsule (500 mcg total) by mouth 2 (two) times daily.  180 capsule  0  . Magnesium 250 MG TABS Take by mouth daily.        . metoprolol succinate (TOPROL-XL) 50 MG 24 hr tablet Take 1 tablet (50 mg total) by mouth daily.  90 tablet  3  . Multiple Vitamins-Minerals (OCUVITE-LUTEIN PO) Take by mouth daily.        . nateglinide (STARLIX) 120 MG tablet Take 1 tablet by mouth Twice daily.      . pioglitazone (ACTOS) 45 MG tablet Take 45 mg by  mouth daily.        . sitaGLIPtin (JANUVIA) 100 MG tablet Take 100 mg by mouth daily.        Marland Kitchen amoxicillin (AMOXIL) 500 MG capsule        No current facility-administered medications for this visit.    Allergies  Allergen Reactions  . Sotalol   . Warfarin Sodium     REACTION: migraine headaches/vision impariment    Review of Systems negative except from HPI and PMH  Physical Exam BP 120/67  Pulse 69  Ht 6\' 2"  (1.88 m)  Wt 215 lb (97.523 kg)  BMI 27.59 kg/m2 Well developed and well nourished in no acute distress HENT normal E scleral and icterus clear Neck Supple JVP flat; carotids brisk and full Clear to ausculation  Regular rate and rhythm, no murmurs gallops or rub Soft with active bowel sounds No clubbing cyanosis pitting, non-pitting and nonepossible Edema Alert and oriented, grossly normal motor and  sensory function Skin Warm and Dry    Assessment and  Plan

## 2012-07-04 ENCOUNTER — Ambulatory Visit (HOSPITAL_COMMUNITY)
Admission: RE | Admit: 2012-07-04 | Discharge: 2012-07-04 | Disposition: A | Discharge status: Home / Self Care | Payer: Medicare Other | Source: Ambulatory Visit | Attending: Cardiovascular Disease | Admitting: Cardiovascular Disease

## 2012-07-04 ENCOUNTER — Encounter (HOSPITAL_COMMUNITY)
Admission: RE | Disposition: A | Discharge status: Home / Self Care | Payer: Self-pay | Source: Ambulatory Visit | Attending: Cardiovascular Disease

## 2012-07-04 DIAGNOSIS — I2510 Atherosclerotic heart disease of native coronary artery without angina pectoris: Secondary | ICD-10-CM | POA: Insufficient documentation

## 2012-07-04 DIAGNOSIS — Z9889 Other specified postprocedural states: Secondary | ICD-10-CM | POA: Diagnosis not present

## 2012-07-04 DIAGNOSIS — I4949 Other premature depolarization: Secondary | ICD-10-CM | POA: Insufficient documentation

## 2012-07-04 DIAGNOSIS — I7300 Raynaud's syndrome without gangrene: Secondary | ICD-10-CM | POA: Diagnosis not present

## 2012-07-04 DIAGNOSIS — Z951 Presence of aortocoronary bypass graft: Secondary | ICD-10-CM | POA: Insufficient documentation

## 2012-07-04 DIAGNOSIS — I209 Angina pectoris, unspecified: Secondary | ICD-10-CM | POA: Diagnosis not present

## 2012-07-04 DIAGNOSIS — I4891 Unspecified atrial fibrillation: Secondary | ICD-10-CM | POA: Insufficient documentation

## 2012-07-04 DIAGNOSIS — I472 Ventricular tachycardia, unspecified: Secondary | ICD-10-CM | POA: Insufficient documentation

## 2012-07-04 DIAGNOSIS — Z79899 Other long term (current) drug therapy: Secondary | ICD-10-CM | POA: Insufficient documentation

## 2012-07-04 DIAGNOSIS — Z7982 Long term (current) use of aspirin: Secondary | ICD-10-CM | POA: Insufficient documentation

## 2012-07-04 DIAGNOSIS — I519 Heart disease, unspecified: Secondary | ICD-10-CM | POA: Diagnosis not present

## 2012-07-04 DIAGNOSIS — Z95810 Presence of automatic (implantable) cardiac defibrillator: Secondary | ICD-10-CM | POA: Insufficient documentation

## 2012-07-04 DIAGNOSIS — I4729 Other ventricular tachycardia: Secondary | ICD-10-CM | POA: Insufficient documentation

## 2012-07-04 HISTORY — PX: LEFT HEART CATHETERIZATION WITH CORONARY ANGIOGRAM: SHX5451

## 2012-07-04 LAB — GLUCOSE, CAPILLARY: Glucose-Capillary: 115 mg/dL — ABNORMAL HIGH (ref 70–99)

## 2012-07-04 LAB — POCT ACTIVATED CLOTTING TIME: Activated Clotting Time: 198 seconds

## 2012-07-04 SURGERY — LEFT HEART CATHETERIZATION WITH CORONARY ANGIOGRAM
Anesthesia: LOCAL

## 2012-07-04 MED ORDER — LIDOCAINE HCL (PF) 1 % IJ SOLN
INTRAMUSCULAR | Status: AC
  Filled 2012-07-04: qty 30

## 2012-07-04 MED ORDER — MIDAZOLAM HCL 2 MG/2ML IJ SOLN
INTRAMUSCULAR | Status: AC
  Filled 2012-07-04: qty 2

## 2012-07-04 MED ORDER — SODIUM CHLORIDE 0.9 % IV SOLN: 250.0000 mL | INTRAVENOUS | Status: DC | PRN

## 2012-07-04 MED ORDER — FENTANYL CITRATE 0.05 MG/ML IJ SOLN
INTRAMUSCULAR | Status: AC
  Filled 2012-07-04: qty 2

## 2012-07-04 MED ORDER — SODIUM CHLORIDE 0.9 % IJ SOLN: 3.0000 mL | INTRAMUSCULAR | Status: DC | PRN

## 2012-07-04 MED ORDER — ACETAMINOPHEN 325 MG PO TABS: 650.0000 mg | ORAL_TABLET | ORAL | Status: DC | PRN

## 2012-07-04 MED ORDER — HEPARIN SODIUM (PORCINE) 1000 UNIT/ML IJ SOLN
INTRAMUSCULAR | Status: AC
  Filled 2012-07-04: qty 1

## 2012-07-04 MED ORDER — SODIUM CHLORIDE 0.9 % IV SOLN
INTRAVENOUS | Status: DC
  Administered 2012-07-04: 11:00:00 via INTRAVENOUS

## 2012-07-04 MED ORDER — ASPIRIN 81 MG PO CHEW: 324.0000 mg | CHEWABLE_TABLET | ORAL | Status: AC

## 2012-07-04 MED ORDER — HEPARIN (PORCINE) IN NACL 2-0.9 UNIT/ML-% IJ SOLN
INTRAMUSCULAR | Status: AC
  Filled 2012-07-04: qty 1000

## 2012-07-04 MED ORDER — ONDANSETRON HCL 4 MG/2ML IJ SOLN: 4.0000 mg | Freq: Four times a day (QID) | INTRAMUSCULAR | Status: DC | PRN

## 2012-07-04 MED ORDER — SODIUM CHLORIDE 0.9 % IV SOLN: 1.0000 mL/kg/h | INTRAVENOUS | Status: DC

## 2012-07-04 MED ORDER — ADENOSINE 12 MG/4ML IV SOLN
16.0000 mL | INTRAVENOUS | Status: DC
  Filled 2012-07-04: qty 16

## 2012-07-04 MED ORDER — SODIUM CHLORIDE 0.9 % IJ SOLN: 3.0000 mL | Freq: Two times a day (BID) | INTRAMUSCULAR | Status: DC

## 2012-07-04 MED ORDER — ASPIRIN 81 MG PO CHEW
CHEWABLE_TABLET | ORAL | Status: AC
  Administered 2012-07-04: 324 mg via ORAL
  Filled 2012-07-04: qty 4

## 2012-07-04 NOTE — Interval H&P Note (Signed)
History and Physical Interval Note:  07/04/2012 2:43 PM  [name]  has presented today for surgery, with the diagnosis of Chest pain  The various methods of treatment have been discussed with the patient and family. After consideration of risks, benefits and other options for treatment, the patient has consented to  Procedure(s): LEFT HEART CATHETERIZATION WITH CORONARY ANGIOGRAM (N/A) as a surgical intervention .  The patient's history has been reviewed, patient examined, no change in status, stable for surgery.  I have reviewed the patient's chart and labs.  Questions were answered to the patient's satisfaction.    Dr Odessa Fleming H&P reviewed, but not signed yet so I can't update that note. I met this patient in the office yesterday when he saw Dr Graciela Husbands. Agree with plans for cath and possible PCI. Reviewed risks, indication, and alternatives with patient and his wife. All questions were answered.   [name]

## 2012-07-04 NOTE — Progress Notes (Signed)
Pt walked in hallway without difficulty. Oozing noted on right groin dressing, dressing changed and walked pt in hallway again. Right groin dressing remained CDI.

## 2012-07-04 NOTE — CV Procedure (Signed)
   Cardiac Catheterization Procedure Note  Name: [name] MRN: [id number] DOB: 02/12/1931  Procedure: Left Heart Cath, Selective Coronary Angiography, LV angiography, LIMA angiography, free radial graft angiography, FFR of the Left Circumflex  Indication: Exertional angina, accelerating symptoms   Procedural details: The right groin was prepped, draped, and anesthetized with 1% lidocaine. Using modified Seldinger technique, a 5 French sheath was introduced into the right femoral artery. Standard Judkins catheters were used for coronary angiography and left ventriculography. Catheter exchanges were performed over a guidewire. There were no immediate procedural complications. The patient was transferred to the post catheterization recovery area for further monitoring.  Following the diagnostic procedure, I elected to perform pressure wire analysis of the left circumflex. There is eccentric calcification and I was uncertain about the degree of stenosis. In some views this appeared tight in other views it appeared widely patent. Unfractionated heparin was administered. The sheath was upsized to a 6 [place]. Using normal technique, a pressure wire was advanced into the distal circumflex beyond the area of stenosis once a therapeutic ACT had been achieved. Intravenous adenosine was administered. The FFR at peak hyperemia was 0.96. The patient tolerated the procedure well. There were no immediate complications. A Perclose device was deployed for femoral hemostasis.  Procedural Findings: Hemodynamics:  AO 93/51 LV 93/8   Coronary angiography: Coronary dominance: right  Left mainstem: The left main is moderately calcified. There is 30% distal left main stenosis.  Left anterior descending (LAD): The LAD is heavily calcified. The vessel has 75% proximal vessel stenosis. There is both antegrade and competitive filling of the mid and distal LAD from the graft. The vessel reaches the left  ventricular apex.  Left circumflex (LCx): The left circumflex has moderate to severe calcification. This is a large, dominant vessel. The proximal circumflex has eccentric calcification with associated 50% stenosis. In some views this appears tight. In other views this appears nonobstructive. The first OM is patent with nonobstructive disease. The origin arises from the area of heavy calcification in the proximal circumflex. There is 50% ostial stenosis present. The second OM has 80% ostial stenosis and this vessel was grafted. The distal circumflex is widely patent and it is a large caliber vessel. It gives off a PDA and PLA branch without significant stenoses.  Right coronary artery (RCA): The RCA is patent. The vessel is nondominant. There is moderate stenosis in the mid vessel.  LIMA to LAD: This is a large caliber conduit and it is widely patent. The distal anastomotic site appears widely patent.  Free radial graft to second OM. The ostium has 50% stenosis. This is focal and it involves the true ostium. I suspect this is mechanical. The remaining portions of the vessel are widely patent without significant stenosis. The distal anastomosis is patent without stenosis. There is TIMI-3 flow present.  Left ventriculography: There is hypokinesis and dyssynergy of the anterior wall. The LV is not well-opacified. I would estimate the left ventricular ejection fraction at 35-40%.  Final Conclusions:   1. 2 vessel coronary artery disease with severe stenosis of the proximal LAD and continued patency of the LAD, moderate stenosis of the proximal circumflex with heavy calcification and continued patency of the free RIMA to obtuse marginal graft 2. Moderate segmental left jugular systolic dysfunction 3. Negative pressure wire analysis of the left circumflex  Recommendations: Continued medical management is indicated.  [name] 07/04/2012, 3:44 PM

## 2012-07-05 ENCOUNTER — Telehealth: Payer: Self-pay | Admitting: Cardiovascular Disease

## 2012-07-05 LAB — GLUCOSE, CAPILLARY: Glucose-Capillary: 86 mg/dL (ref 70–99)

## 2012-07-05 NOTE — Telephone Encounter (Signed)
New Problem  Pt states he had a heart cath placed around 6.5.14 and is due to see Dr Excell Seltzer in about two weeks. Dr Excell Seltzer does not have anything available until August.  I offered pt an appt with the PA on 6.20.14 and he declined.  He said that he will just wait to see Dr Excell Seltzer in August. Just wanted to give you and FYI.

## 2012-07-09 NOTE — Telephone Encounter (Signed)
Per Dr Excell Seltzer the August appointment is okay. Medical therapy was recommended at the time of cardiac catheterization.

## 2012-07-11 ENCOUNTER — Encounter: Payer: Self-pay | Admitting: Cardiovascular Disease

## 2012-09-04 ENCOUNTER — Other Ambulatory Visit: Payer: Self-pay

## 2012-09-05 ENCOUNTER — Telehealth: Payer: Self-pay | Admitting: Internal Medicine

## 2012-09-05 NOTE — Telephone Encounter (Signed)
New Prob  Pt is feeling dizzy frequently.

## 2012-09-05 NOTE — Telephone Encounter (Signed)
Spoke with pt, he was started on isosorbide 30 mg by dr Graciela Husbands at last office visit, since that time pt reports he feels bad. He has lost 15 lbs and is having a lot of dizziness. His bp is 105/60. He would like to stop the isosorbide to see if his symptoms improve. Okay given for pt to stop, okay also given for pt to try 1/2 tablet to see if symptoms improve. Pt voiced understanding.

## 2012-09-10 ENCOUNTER — Other Ambulatory Visit: Payer: Self-pay

## 2012-09-10 MED ORDER — DOFETILIDE 500 MCG PO CAPS: 500.0000 ug | ORAL_CAPSULE | Freq: Two times a day (BID) | ORAL | Status: DC

## 2012-09-26 ENCOUNTER — Telehealth: Payer: Self-pay | Admitting: *Deleted

## 2012-09-26 NOTE — Telephone Encounter (Signed)
Called pt that 3 bottles of [name] came in. Informed him that is was left at front for pick up

## 2012-09-27 ENCOUNTER — Ambulatory Visit (INDEPENDENT_AMBULATORY_CARE_PROVIDER_SITE_OTHER): Payer: Medicare Other | Admitting: Cardiovascular Disease

## 2012-09-27 ENCOUNTER — Encounter: Payer: Self-pay | Admitting: Cardiovascular Disease

## 2012-09-27 ENCOUNTER — Other Ambulatory Visit: Payer: Self-pay | Admitting: *Deleted

## 2012-09-27 VITALS — BP 112/80 | HR 64 | Ht 74.0 in | Wt 204.0 lb

## 2012-09-27 DIAGNOSIS — I2510 Atherosclerotic heart disease of native coronary artery without angina pectoris: Secondary | ICD-10-CM

## 2012-09-27 NOTE — Progress Notes (Signed)
HPI:   79 year old gentleman presenting for followup evaluation. The patient has a history of polymorphic ventricular tachycardia. He has an ICD. He has undergone coronary bypass surgery and mitral valve repair at the [facility] clinic. He has been managed with dofetilide for his arrhythmias. The patient is followed by Dr. Graciela Husbands. He underwent heart catheterization in June 2014 demonstrating patency of his LIMA to LAD and free RIMA to OM with moderate left circumflex stenosis which was nonischemic by pressure wire analysis. He presents today for followup.  The patient is doing very well. He denies chest pain, chest pressure, palpitations, lightheadedness, syncope, or leg swelling. He's doing some light exercises on a regular basis. He has no exertional symptoms. He is compliant with his medications. He's lost 10 pounds and his blood pressures have been running lower. He brings in a chart today and his blood pressures have ranged from 100-121/58-70. Heart rates have been in the 60s. He stopped isosorbide because of lightheadedness and headache. He feels better off of this medication. He's had no anginal symptoms since stopping it.  Outpatient Encounter Prescriptions as of 09/27/2012  Medication Sig Dispense Refill  . aspirin 81 MG tablet Take 81 mg by mouth daily.        Marland Kitchen atorvastatin (LIPITOR) 80 MG tablet Take 1 tablet by mouth Daily.      . Cholecalciferol (VITAMIN D-3) 5000 UNITS TABS Take 5,000-10,000 Units by mouth daily. EVERY OTHER DAY. Take 1 tablet four times a WEEK and then take 2 tablets three times a WEEK.      . dofetilide ([name]) 500 MCG capsule Take 1 capsule (500 mcg total) by mouth 2 (two) times daily.  180 capsule  2  . EPIPEN 2-PAK 0.3 MG/0.3ML SOAJ injection USE ONLY IN EMERGENCY      . fluticasone (FLONASE) 50 MCG/ACT nasal spray Place 1 spray into the nose daily as needed for rhinitis or allergies.      . Magnesium 250 MG TABS Take 250 mg by mouth daily.       . metoprolol  succinate (TOPROL-XL) 50 MG 24 hr tablet Take 1 tablet (50 mg total) by mouth daily.  90 tablet  3  . Multiple Vitamin (MULTIVITAMIN WITH MINERALS) TABS Take 1 tablet by mouth daily.      . Multiple Vitamins-Minerals (PRESERVISION AREDS 2) CAPS Take 2 capsules by mouth daily.      . nateglinide (STARLIX) 120 MG tablet Take 1 tablet by mouth Twice daily.      . pioglitazone (ACTOS) 45 MG tablet Take 45 mg by mouth daily.        . sitaGLIPtin (JANUVIA) 100 MG tablet Take 100 mg by mouth daily.        . [DISCONTINUED] isosorbide mononitrate (IMDUR) 30 MG 24 hr tablet Take 1 tablet (30 mg total) by mouth daily.  30 tablet  3  . [DISCONTINUED] isosorbide mononitrate (IMDUR) 30 MG 24 hr tablet Take 30 mg by mouth daily.       No facility-administered encounter medications on file as of 09/27/2012.    Allergies  Allergen Reactions  . Other     BEE STINGS   . Sotalol     "BLUE TOES"- cut his circulation off  . Warfarin Sodium     REACTION: migraine headaches/vision impariment    Past Medical History  Diagnosis Date  . [place]     [place] [facility]  . Ventricular tachycardia, polymorphic     status post  ICD implantation  . Raynaud's disease   . ICD (implantable cardiac defibrillator) in place   . Diabetes mellitus   . Endocarditis, valve unspecified     Mitral  . PAF (paroxysmal atrial fibrillation)   . PVC's (premature ventricular contractions)   . History of seasonal allergies   . History of migraine headaches   . Pleural effusion     ROS: Negative except as per HPI  BP 112/80  Pulse 64  Ht 6\' 2"  (1.88 m)  Wt 204 lb (92.534 kg)  BMI 26.18 kg/m2  SpO2 99%  PHYSICAL EXAM: Pt is alert and oriented, NAD HEENT: normal Neck: JVP - normal, carotids 2+= without bruits Lungs: CTA bilaterally CV: RRR without murmur or gallop Abd: soft, NT, Positive BS, no hepatomegaly Ext: no C/C/E, distal pulses intact and equal Skin: warm/dry no  rash  Cardiac catheterization 07/04/2012: Procedural details: The right groin was prepped, draped, and anesthetized with 1% lidocaine. Using modified Seldinger technique, a 5 French sheath was introduced into the right femoral artery. Standard Judkins catheters were used for coronary angiography and left ventriculography. Catheter exchanges were performed over a guidewire. There were no immediate procedural complications. The patient was transferred to the post catheterization recovery area for further monitoring.  Following the diagnostic procedure, I elected to perform pressure wire analysis of the left circumflex. There is eccentric calcification and I was uncertain about the degree of stenosis. In some views this appeared tight in other views it appeared widely patent. Unfractionated heparin was administered. The sheath was upsized to a 6 [place]. Using normal technique, a pressure wire was advanced into the distal circumflex beyond the area of stenosis once a therapeutic ACT had been achieved. Intravenous adenosine was administered. The FFR at peak hyperemia was 0.96. The patient tolerated the procedure well. There were no immediate complications. A Perclose device was deployed for femoral hemostasis.  Procedural Findings:  Hemodynamics:  AO 93/51  LV 93/8  Coronary angiography:  Coronary dominance: right  Left mainstem: The left main is moderately calcified. There is 30% distal left main stenosis.  Left anterior descending (LAD): The LAD is heavily calcified. The vessel has 75% proximal vessel stenosis. There is both antegrade and competitive filling of the mid and distal LAD from the graft. The vessel reaches the left ventricular apex.  Left circumflex (LCx): The left circumflex has moderate to severe calcification. This is a large, dominant vessel. The proximal circumflex has eccentric calcification with associated 50% stenosis. In some views this appears tight. In other views this appears  nonobstructive. The first OM is patent with nonobstructive disease. The origin arises from the area of heavy calcification in the proximal circumflex. There is 50% ostial stenosis present. The second OM has 80% ostial stenosis and this vessel was grafted. The distal circumflex is widely patent and it is a large caliber vessel. It gives off a PDA and PLA branch without significant stenoses.  Right coronary artery (RCA): The RCA is patent. The vessel is nondominant. There is moderate stenosis in the mid vessel.  LIMA to LAD: This is a large caliber conduit and it is widely patent. The distal anastomotic site appears widely patent.  Free radial graft to second OM. The ostium has 50% stenosis. This is focal and it involves the true ostium. I suspect this is mechanical. The remaining portions of the vessel are widely patent without significant stenosis. The distal anastomosis is patent without stenosis. There is TIMI-3 flow present.  Left ventriculography: There is hypokinesis  and dyssynergy of the anterior wall. The LV is not well-opacified. I would estimate the left ventricular ejection fraction at 35-40%.  Final Conclusions:  1. 2 vessel coronary artery disease with severe stenosis of the proximal LAD and continued patency of the LAD, moderate stenosis of the proximal circumflex with heavy calcification and continued patency of the free RIMA to obtuse marginal graft  2. Moderate segmental left jugular systolic dysfunction  3. Negative pressure wire analysis of the left circumflex  Recommendations: Continued medical management is indicated.  [name]  07/04/2012, 3:44 PM       ASSESSMENT AND PLAN: 1. Coronary artery disease, native vessel. The patient is stable without anginal symptoms. He is on appropriate risk reduction medications with aspirin, metoprolol succinate, and atorvastatin at high dose. Will continue his current program without changes. I reviewed the details of his heart  catheterization findings with him today.  2. Hyperlipidemia. The patient is on atorvastatin 80 mg. Lipids are followed by Dr. Foy Guadalajara.  3. Polymorphic VT. Managed with dofetilide. The patient has an ICD and he is followed by Dr. Graciela Husbands.  For followup I will see him back next year and we'll try to arrange our appointments so that his annual appointment with Dr Graciela Husbands and I are offset by about 6 months.  [name] 09/27/2012 11:31 AM

## 2012-09-27 NOTE — Patient Instructions (Addendum)
Your physician wants you to follow-up in: December 2015 with Dr Excell Seltzer.  You will receive a reminder letter in the mail two months in advance. If you don't receive a letter, please call our office to schedule the follow-up appointment.  Your physician recommends that you continue on your current medications as directed. Please refer to the Current Medication list given to you today.

## 2012-10-07 ENCOUNTER — Ambulatory Visit (INDEPENDENT_AMBULATORY_CARE_PROVIDER_SITE_OTHER): Payer: Medicare Other | Admitting: *Deleted

## 2012-10-07 DIAGNOSIS — I4729 Other ventricular tachycardia: Secondary | ICD-10-CM

## 2012-10-07 DIAGNOSIS — I472 Ventricular tachycardia, unspecified: Secondary | ICD-10-CM

## 2012-10-08 LAB — REMOTE ICD DEVICE
AL AMPLITUDE: 2 mv
AL IMPEDENCE ICD: 410 Ohm
ATRIAL PACING ICD: 93 pct
BAMS-0001: 150 {beats}/min
BAMS-0003: 70 {beats}/min
BATTERY VOLTAGE: 2.57 V
DEV-0020ICD: NEGATIVE
DEVICE MODEL ICD: 554225
HV IMPEDENCE: 42 Ohm
MODE SWITCH EPISODES: 17
RV LEAD AMPLITUDE: 8.3 mv
RV LEAD IMPEDENCE ICD: 550 Ohm
TZAT-0001FASTVT: 1
TZAT-0004FASTVT: 8
TZAT-0012FASTVT: 200 ms
TZAT-0013FASTVT: 1
TZAT-0018FASTVT: NEGATIVE
TZAT-0019FASTVT: 7.5 V
TZAT-0020FASTVT: 1 ms
TZON-0003FASTVT: 350 ms
TZON-0003SLOWVT: 425 ms
TZON-0004FASTVT: 20
TZON-0004SLOWVT: 15
TZON-0005FASTVT: 6
TZON-0005SLOWVT: 6
TZON-0010FASTVT: 80 ms
TZON-0010SLOWVT: 80 ms
TZST-0001FASTVT: 2
TZST-0001FASTVT: 3
TZST-0001FASTVT: 4
TZST-0001FASTVT: 5
TZST-0003FASTVT: 30 J
TZST-0003FASTVT: 36 J
TZST-0003FASTVT: 36 J
TZST-0003FASTVT: 36 J
VENTRICULAR PACING ICD: 91 pct

## 2012-10-23 DIAGNOSIS — E785 Hyperlipidemia, unspecified: Secondary | ICD-10-CM | POA: Diagnosis not present

## 2012-10-23 DIAGNOSIS — E1169 Type 2 diabetes mellitus with other specified complication: Secondary | ICD-10-CM | POA: Diagnosis not present

## 2012-10-23 DIAGNOSIS — I2510 Atherosclerotic heart disease of native coronary artery without angina pectoris: Secondary | ICD-10-CM | POA: Diagnosis not present

## 2012-10-23 DIAGNOSIS — E1139 Type 2 diabetes mellitus with other diabetic ophthalmic complication: Secondary | ICD-10-CM | POA: Diagnosis not present

## 2012-10-25 DIAGNOSIS — Z23 Encounter for immunization: Secondary | ICD-10-CM | POA: Diagnosis not present

## 2012-10-25 DIAGNOSIS — I2510 Atherosclerotic heart disease of native coronary artery without angina pectoris: Secondary | ICD-10-CM | POA: Diagnosis not present

## 2012-10-25 DIAGNOSIS — E559 Vitamin D deficiency, unspecified: Secondary | ICD-10-CM | POA: Diagnosis not present

## 2012-10-25 DIAGNOSIS — E1169 Type 2 diabetes mellitus with other specified complication: Secondary | ICD-10-CM | POA: Diagnosis not present

## 2012-10-25 DIAGNOSIS — E785 Hyperlipidemia, unspecified: Secondary | ICD-10-CM | POA: Diagnosis not present

## 2012-10-25 DIAGNOSIS — E1139 Type 2 diabetes mellitus with other diabetic ophthalmic complication: Secondary | ICD-10-CM | POA: Diagnosis not present

## 2012-11-04 ENCOUNTER — Encounter: Payer: Self-pay | Admitting: *Deleted

## 2012-11-26 ENCOUNTER — Other Ambulatory Visit: Payer: Self-pay

## 2012-11-26 DIAGNOSIS — I4891 Unspecified atrial fibrillation: Secondary | ICD-10-CM

## 2012-11-26 MED ORDER — METOPROLOL SUCCINATE ER 50 MG PO TB24: 50.0000 mg | ORAL_TABLET | Freq: Every day | ORAL | Status: DC

## 2012-12-02 DIAGNOSIS — H35379 Puckering of macula, unspecified eye: Secondary | ICD-10-CM | POA: Diagnosis not present

## 2012-12-02 DIAGNOSIS — E1139 Type 2 diabetes mellitus with other diabetic ophthalmic complication: Secondary | ICD-10-CM | POA: Diagnosis not present

## 2012-12-02 DIAGNOSIS — E11339 Type 2 diabetes mellitus with moderate nonproliferative diabetic retinopathy without macular edema: Secondary | ICD-10-CM | POA: Diagnosis not present

## 2012-12-02 DIAGNOSIS — H35049 Retinal micro-aneurysms, unspecified, unspecified eye: Secondary | ICD-10-CM | POA: Diagnosis not present

## 2012-12-03 ENCOUNTER — Other Ambulatory Visit: Payer: Self-pay

## 2012-12-03 DIAGNOSIS — I4891 Unspecified atrial fibrillation: Secondary | ICD-10-CM

## 2012-12-03 MED ORDER — METOPROLOL SUCCINATE ER 50 MG PO TB24: 50.0000 mg | ORAL_TABLET | Freq: Every day | ORAL | Status: DC

## 2012-12-05 ENCOUNTER — Other Ambulatory Visit: Payer: Self-pay

## 2012-12-09 ENCOUNTER — Encounter: Payer: Self-pay | Admitting: Internal Medicine

## 2012-12-19 ENCOUNTER — Telehealth: Payer: Self-pay | Admitting: *Deleted

## 2012-12-19 NOTE — Telephone Encounter (Signed)
Called patient to let him know that his tikosyn had arrived from [place].

## 2013-01-06 ENCOUNTER — Ambulatory Visit (INDEPENDENT_AMBULATORY_CARE_PROVIDER_SITE_OTHER): Payer: Medicare Other | Admitting: *Deleted

## 2013-01-06 ENCOUNTER — Encounter: Payer: Self-pay | Admitting: Internal Medicine

## 2013-01-06 DIAGNOSIS — I472 Ventricular tachycardia: Secondary | ICD-10-CM | POA: Diagnosis not present

## 2013-01-06 DIAGNOSIS — I4891 Unspecified atrial fibrillation: Secondary | ICD-10-CM

## 2013-01-06 DIAGNOSIS — I4729 Other ventricular tachycardia: Secondary | ICD-10-CM

## 2013-01-06 LAB — MDC_IDC_ENUM_SESS_TYPE_REMOTE
Brady Statistic RA Percent Paced: 93 %
Brady Statistic RV Percent Paced: 92 %
HighPow Impedance: 43 Ohm
Implantable Pulse Generator Serial Number: 554225
Lead Channel Impedance Value: 430 Ohm
Lead Channel Impedance Value: 530 Ohm
Lead Channel Pacing Threshold Amplitude: 0.5 V
Lead Channel Pacing Threshold Amplitude: 0.75 V
Lead Channel Pacing Threshold Pulse Width: 0.5 ms
Lead Channel Pacing Threshold Pulse Width: 0.5 ms
Lead Channel Sensing Intrinsic Amplitude: 1.9 mV
Lead Channel Sensing Intrinsic Amplitude: 9 mV
Lead Channel Setting Pacing Amplitude: 2 V
Lead Channel Setting Pacing Amplitude: 2.5 V
Lead Channel Setting Pacing Pulse Width: 0.5 ms
Lead Channel Setting Sensing Sensitivity: 0.3 mV
Zone Setting Detection Interval: 250 ms
Zone Setting Detection Interval: 350 ms
Zone Setting Detection Interval: 425 ms

## 2013-01-28 ENCOUNTER — Encounter: Payer: Self-pay | Admitting: *Deleted

## 2013-02-11 NOTE — Addendum Note (Signed)
Addended by: Georjean Mode B on: 02/11/2013 02:13 PM   Modules accepted: Level of Service, SmartSet

## 2013-02-11 NOTE — Progress Notes (Signed)
This encounter was created in error - please disregard.

## 2013-04-09 ENCOUNTER — Ambulatory Visit (INDEPENDENT_AMBULATORY_CARE_PROVIDER_SITE_OTHER): Payer: Medicare Other | Admitting: *Deleted

## 2013-04-09 ENCOUNTER — Encounter: Payer: Self-pay | Admitting: Internal Medicine

## 2013-04-09 DIAGNOSIS — I472 Ventricular tachycardia: Secondary | ICD-10-CM | POA: Diagnosis not present

## 2013-04-09 DIAGNOSIS — Z95810 Presence of automatic (implantable) cardiac defibrillator: Secondary | ICD-10-CM | POA: Diagnosis not present

## 2013-04-09 DIAGNOSIS — I4729 Other ventricular tachycardia: Secondary | ICD-10-CM | POA: Diagnosis not present

## 2013-04-09 LAB — MDC_IDC_ENUM_SESS_TYPE_REMOTE
Brady Statistic RA Percent Paced: 91 %
Brady Statistic RV Percent Paced: 87 %
HighPow Impedance: 41 Ohm
Implantable Pulse Generator Serial Number: 554225
Lead Channel Impedance Value: 400 Ohm
Lead Channel Impedance Value: 510 Ohm
Lead Channel Sensing Intrinsic Amplitude: 11.8 mV
Lead Channel Sensing Intrinsic Amplitude: 2.6 mV
Lead Channel Setting Pacing Amplitude: 2 V
Lead Channel Setting Pacing Amplitude: 2.5 V
Lead Channel Setting Pacing Pulse Width: 0.5 ms
Lead Channel Setting Sensing Sensitivity: 0.3 mV
Zone Setting Detection Interval: 250 ms
Zone Setting Detection Interval: 350 ms
Zone Setting Detection Interval: 425 ms

## 2013-04-24 DIAGNOSIS — J019 Acute sinusitis, unspecified: Secondary | ICD-10-CM | POA: Diagnosis not present

## 2013-04-28 DIAGNOSIS — E785 Hyperlipidemia, unspecified: Secondary | ICD-10-CM | POA: Diagnosis not present

## 2013-04-28 DIAGNOSIS — E1139 Type 2 diabetes mellitus with other diabetic ophthalmic complication: Secondary | ICD-10-CM | POA: Diagnosis not present

## 2013-04-28 DIAGNOSIS — I2510 Atherosclerotic heart disease of native coronary artery without angina pectoris: Secondary | ICD-10-CM | POA: Diagnosis not present

## 2013-04-28 DIAGNOSIS — E1169 Type 2 diabetes mellitus with other specified complication: Secondary | ICD-10-CM | POA: Diagnosis not present

## 2013-04-30 ENCOUNTER — Encounter: Payer: Self-pay | Admitting: *Deleted

## 2013-04-30 DIAGNOSIS — E559 Vitamin D deficiency, unspecified: Secondary | ICD-10-CM | POA: Diagnosis not present

## 2013-04-30 DIAGNOSIS — E1139 Type 2 diabetes mellitus with other diabetic ophthalmic complication: Secondary | ICD-10-CM | POA: Diagnosis not present

## 2013-04-30 DIAGNOSIS — I2510 Atherosclerotic heart disease of native coronary artery without angina pectoris: Secondary | ICD-10-CM | POA: Diagnosis not present

## 2013-04-30 DIAGNOSIS — E1169 Type 2 diabetes mellitus with other specified complication: Secondary | ICD-10-CM | POA: Diagnosis not present

## 2013-04-30 DIAGNOSIS — E785 Hyperlipidemia, unspecified: Secondary | ICD-10-CM | POA: Diagnosis not present

## 2013-05-12 ENCOUNTER — Other Ambulatory Visit: Payer: Self-pay | Admitting: Dermatology

## 2013-05-12 DIAGNOSIS — Z85828 Personal history of other malignant neoplasm of skin: Secondary | ICD-10-CM | POA: Diagnosis not present

## 2013-05-12 DIAGNOSIS — D1801 Hemangioma of skin and subcutaneous tissue: Secondary | ICD-10-CM | POA: Diagnosis not present

## 2013-05-12 DIAGNOSIS — D239 Other benign neoplasm of skin, unspecified: Secondary | ICD-10-CM | POA: Diagnosis not present

## 2013-05-12 DIAGNOSIS — D485 Neoplasm of uncertain behavior of skin: Secondary | ICD-10-CM | POA: Diagnosis not present

## 2013-05-12 DIAGNOSIS — L820 Inflamed seborrheic keratosis: Secondary | ICD-10-CM | POA: Diagnosis not present

## 2013-05-13 DIAGNOSIS — R0989 Other specified symptoms and signs involving the circulatory and respiratory systems: Secondary | ICD-10-CM | POA: Diagnosis not present

## 2013-05-13 DIAGNOSIS — R0609 Other forms of dyspnea: Secondary | ICD-10-CM | POA: Diagnosis not present

## 2013-05-13 DIAGNOSIS — J449 Chronic obstructive pulmonary disease, unspecified: Secondary | ICD-10-CM | POA: Diagnosis not present

## 2013-06-19 DIAGNOSIS — I959 Hypotension, unspecified: Secondary | ICD-10-CM | POA: Diagnosis not present

## 2013-06-19 DIAGNOSIS — J449 Chronic obstructive pulmonary disease, unspecified: Secondary | ICD-10-CM | POA: Diagnosis not present

## 2013-07-14 ENCOUNTER — Encounter: Payer: Self-pay | Admitting: Internal Medicine

## 2013-07-14 ENCOUNTER — Ambulatory Visit (INDEPENDENT_AMBULATORY_CARE_PROVIDER_SITE_OTHER): Payer: Medicare Other | Admitting: Internal Medicine

## 2013-07-14 VITALS — BP 121/72 | HR 66 | Ht 74.0 in | Wt 212.0 lb

## 2013-07-14 DIAGNOSIS — I472 Ventricular tachycardia: Secondary | ICD-10-CM

## 2013-07-14 DIAGNOSIS — Z95810 Presence of automatic (implantable) cardiac defibrillator: Secondary | ICD-10-CM | POA: Diagnosis not present

## 2013-07-14 DIAGNOSIS — I4891 Unspecified atrial fibrillation: Secondary | ICD-10-CM | POA: Diagnosis not present

## 2013-07-14 DIAGNOSIS — I4729 Other ventricular tachycardia: Secondary | ICD-10-CM | POA: Diagnosis not present

## 2013-07-14 LAB — MDC_IDC_ENUM_SESS_TYPE_INCLINIC
Battery Remaining Longevity: 22.8 mo
Battery Voltage: 2.56 V
Brady Statistic RA Percent Paced: 91 %
Brady Statistic RV Percent Paced: 86 %
Date Time Interrogation Session: 20150615143032
HighPow Impedance: 39.6118
Implantable Pulse Generator Serial Number: 554225
Lead Channel Impedance Value: 387.5 Ohm
Lead Channel Impedance Value: 537.5 Ohm
Lead Channel Pacing Threshold Amplitude: 0.5 V
Lead Channel Pacing Threshold Amplitude: 0.75 V
Lead Channel Pacing Threshold Pulse Width: 0.5 ms
Lead Channel Pacing Threshold Pulse Width: 0.5 ms
Lead Channel Sensing Intrinsic Amplitude: 2.9 mV
Lead Channel Sensing Intrinsic Amplitude: 9.8 mV
Lead Channel Setting Pacing Amplitude: 2 V
Lead Channel Setting Pacing Amplitude: 2.5 V
Lead Channel Setting Pacing Pulse Width: 0.5 ms
Lead Channel Setting Sensing Sensitivity: 0.3 mV
Zone Setting Detection Interval: 250 ms
Zone Setting Detection Interval: 350 ms
Zone Setting Detection Interval: 425 ms

## 2013-07-14 NOTE — Patient Instructions (Signed)
Your physician recommends that you continue on your current medications as directed. Please refer to the Current Medication list given to you today.  Remote monitoring is used to monitor your Pacemaker of ICD from home. This monitoring reduces the number of office visits required to check your device to one time per year. It allows [name] to keep an eye on the functioning of your device to ensure it is working properly. You are scheduled for a device check from home on 10/13/13. You may send your transmission at any time that day. If you have a wireless device, the transmission will be sent automatically. After your physician reviews your transmission, you will receive a postcard with your next transmission date.  Your physician wants you to follow-up in: 6 months with Dr. Caryl Comes.  You will receive a reminder letter in the mail two months in advance. If you don't receive a letter, please call our office to schedule the follow-up appointment.

## 2013-07-14 NOTE — Progress Notes (Signed)
Patient Care Team: Abigail Miyamoto, MD as PCP - General (Family Medicine)   HPI  [name] is a 80 y.o. male  Seen in followup for polymorphic ventricular tachycardia for which he has an ICD and atrial fibrillation.  He was seen November/13 because of 2 episodes of polymorphic ventricular tachycardia occurring in the preceding few weeks after having been quiet for months and months. There is no clear inciting event. Beta blocker therapy was gently uptitrated    He also has a history of coronary disease with prior CABG mitral valve repair both of which were done at [facility] clinic at that time. he also had a PFO closure.  Is on dofetilide for his arrhythmias when he was seen last year complaints of increasing exercise intolerance and chest discomfort. He underwent catheterization 6/14 demonstrating patency of the LIMA to the LAD and RIMA to his obtuse marginal. Ejection fraction is 35-40%  Has had no significant problems with exercise tolerance. It turned out that he was treated with nebulizer therapy and has exertional chest discomfort and dyspnea resolved.   He has had 3 episodes about a month ago his last couple of minutes where he became profoundly weak and diaphoretic.  He has noted no change in functional status since then.   Past Medical History  Diagnosis Date  . Coronary artery disease     [place] [facility] [facility]  . Ventricular tachycardia, polymorphic     status post ICD implantation  . Raynaud's disease   . ICD (implantable cardiac defibrillator) in place   . Diabetes mellitus   . Endocarditis, valve unspecified     Mitral  . PAF (paroxysmal atrial fibrillation)   . PVC's (premature ventricular contractions)   . History of seasonal allergies   . History of migraine headaches   . Pleural effusion     Past Surgical History  Procedure Laterality Date  . Mitral valve replacement    . Coronary artery bypass graft  2001    2 VESSEL CABG AND MITRAL VALVE  REPAIR. HE HAD A LIMA GRAFT TO THE LAD AND RADIAL ARTERY  GRAFT TO THE OBTUSE MARGINAL  OF LEFT CIRCUMFLEX  . Cardiac defibrillator placement    . Hemorroidectomy    . Knee arthroscopy      RIGHT KNEE  . Cardiac catheterization  04/03/2007    EF 40%  . Cardiac catheterization  02/06/2006    EF 45%. ANTERIOR HYPOKINESIS  . Cardiac catheterization  05/29/2000    EF 50%. MILD ANTERIOR HYPOKINESIS  . Cardiac catheterization  10/25/1999    EF 50%. SEVERE MITRAL REGURGITATION  . Cardiac catheterization  01/06/2003    EF 50%  . Transthoracic echocardiogram  04/02/2007    EF 35%    Current Outpatient Prescriptions  Medication Sig Dispense Refill  . aspirin 81 MG tablet Take 81 mg by mouth daily.        Marland Kitchen atorvastatin (LIPITOR) 80 MG tablet Take 1 tablet by mouth Daily.      . Cholecalciferol (VITAMIN D-3) 5000 UNITS TABS Take 5,000-10,000 Units by mouth daily. EVERY OTHER DAY. Take 1 tablet four times a WEEK and then take 2 tablets three times a WEEK.      . dofetilide ([name]) 500 MCG capsule Take 1 capsule (500 mcg total) by mouth 2 (two) times daily.  180 capsule  2  . EPIPEN 2-PAK 0.3 MG/0.3ML SOAJ injection USE ONLY IN EMERGENCY      . fluticasone (FLONASE) 50 MCG/ACT nasal  spray Place 1 spray into the nose daily as needed for rhinitis or allergies.      . Magnesium 250 MG TABS Take 250 mg by mouth daily.       . metoprolol succinate (TOPROL-XL) 50 MG 24 hr tablet Take 1 tablet (50 mg total) by mouth daily.  90 tablet  3  . Multiple Vitamin (MULTIVITAMIN WITH MINERALS) TABS Take 1 tablet by mouth daily.      . Multiple Vitamins-Minerals (PRESERVISION AREDS 2) CAPS Take 2 capsules by mouth daily.      . nateglinide (STARLIX) 120 MG tablet Take 1 tablet by mouth Twice daily.      . pioglitazone (ACTOS) 45 MG tablet Take 45 mg by mouth daily.        . sitaGLIPtin (JANUVIA) 100 MG tablet Take 100 mg by mouth daily.        Marland Kitchen Umeclidinium-Vilanterol (ANORO ELLIPTA) 62.5-25 MCG/INH AEPB Inhale into  the lungs daily.       No current facility-administered medications for this visit.    Allergies  Allergen Reactions  . Other     BEE STINGS   . Sotalol     "BLUE TOES"- cut his circulation off  . Warfarin Sodium     REACTION: migraine headaches/vision impariment    Review of Systems negative except from HPI and PMH  Physical Exam BP 121/72  Pulse 66  Ht 6\' 2"  (1.88 m)  Wt 212 lb (96.163 kg)  BMI 27.21 kg/m2 Well developed and well nourished in no acute distress HENT normal E scleral and icterus clear Neck Supple JVP flat; carotids brisk and full Clear to ausculation  Regular rate and rhythm, no murmurs gallops or rub Soft with active bowel sounds No clubbing cyanosis pitting, non-pitting  Edema Alert and oriented, grossly normal motor and sensory function Skin Warm and Dry    Assessment and  Plan  Ischemic heart disease with prior bypass  Ischemic cardiomyopathy ejection 35-40%  Congestive heart failure-chronic-systolic  Reactive airways disease  Implantable defibrillator-St. Jude The patient's device was interrogated.  The information was reviewed. No changes were made in the programming.     Presyncope  Atrial fibrillation on dofetilide   Ventricular tachycardia nonsustained No intercurrent Ventricular tachycardia  The patient is having no symptoms of angina at this point. He is euvolemic   Exercise intolerance turns out to be unrelated to reactive airways disease is vastly improved.  Spells of presyncope or concerning. No ventricular arrhythmias were noted. He has had episodic atrial fibrillation. In brief duration would suggest either a vasomotor issue or an arrhythmia. We will plan to have him check his blood pressure if he were to have another event as well as recorded time and date.  Brief episodes of atrial fibrillation are noted. Laboratories recheck 3/15 with potassium of 4.7 and magnesium of 1 his records were reviewed from the outside

## 2013-07-16 ENCOUNTER — Encounter: Payer: Self-pay | Admitting: Internal Medicine

## 2013-07-23 DIAGNOSIS — H109 Unspecified conjunctivitis: Secondary | ICD-10-CM | POA: Diagnosis not present

## 2013-07-23 DIAGNOSIS — Z1331 Encounter for screening for depression: Secondary | ICD-10-CM | POA: Diagnosis not present

## 2013-07-30 DIAGNOSIS — H1045 Other chronic allergic conjunctivitis: Secondary | ICD-10-CM | POA: Diagnosis not present

## 2013-07-30 DIAGNOSIS — T50995A Adverse effect of other drugs, medicaments and biological substances, initial encounter: Secondary | ICD-10-CM | POA: Diagnosis not present

## 2013-10-06 ENCOUNTER — Encounter: Payer: Self-pay | Admitting: Internal Medicine

## 2013-10-07 ENCOUNTER — Other Ambulatory Visit: Payer: Self-pay | Admitting: *Deleted

## 2013-10-07 ENCOUNTER — Other Ambulatory Visit: Payer: Self-pay

## 2013-10-07 MED ORDER — DOFETILIDE 500 MCG PO CAPS: 500.0000 ug | ORAL_CAPSULE | Freq: Two times a day (BID) | ORAL | Status: DC

## 2013-10-08 ENCOUNTER — Other Ambulatory Visit: Payer: Self-pay

## 2013-10-08 MED ORDER — DOFETILIDE 500 MCG PO CAPS: 500.0000 ug | ORAL_CAPSULE | Freq: Two times a day (BID) | ORAL | Status: DC

## 2013-10-13 ENCOUNTER — Ambulatory Visit (INDEPENDENT_AMBULATORY_CARE_PROVIDER_SITE_OTHER): Payer: Medicare Other | Admitting: *Deleted

## 2013-10-13 DIAGNOSIS — I4729 Other ventricular tachycardia: Secondary | ICD-10-CM | POA: Diagnosis not present

## 2013-10-13 DIAGNOSIS — I472 Ventricular tachycardia: Secondary | ICD-10-CM | POA: Diagnosis not present

## 2013-10-13 NOTE — Progress Notes (Signed)
Remote ICD transmission.   

## 2013-10-17 LAB — MDC_IDC_ENUM_SESS_TYPE_REMOTE
Battery Remaining Longevity: 14 mo
Battery Voltage: 2.54 V
Brady Statistic AP VP Percent: 81 %
Brady Statistic AP VS Percent: 11 %
Brady Statistic AS VP Percent: 5 %
Brady Statistic AS VS Percent: 1.6 %
Brady Statistic RA Percent Paced: 89 %
Brady Statistic RV Percent Paced: 86 %
Date Time Interrogation Session: 20150914071838
HighPow Impedance: 40 Ohm
Implantable Pulse Generator Serial Number: 554225
Lead Channel Impedance Value: 410 Ohm
Lead Channel Impedance Value: 540 Ohm
Lead Channel Pacing Threshold Amplitude: 0.5 V
Lead Channel Pacing Threshold Amplitude: 0.75 V
Lead Channel Pacing Threshold Pulse Width: 0.5 ms
Lead Channel Pacing Threshold Pulse Width: 0.5 ms
Lead Channel Sensing Intrinsic Amplitude: 11.8 mV
Lead Channel Sensing Intrinsic Amplitude: 2 mV
Lead Channel Setting Pacing Amplitude: 2 V
Lead Channel Setting Pacing Amplitude: 2.5 V
Lead Channel Setting Pacing Pulse Width: 0.5 ms
Lead Channel Setting Sensing Sensitivity: 0.3 mV
Zone Setting Detection Interval: 250 ms
Zone Setting Detection Interval: 350 ms
Zone Setting Detection Interval: 425 ms

## 2013-10-27 DIAGNOSIS — I2510 Atherosclerotic heart disease of native coronary artery without angina pectoris: Secondary | ICD-10-CM | POA: Diagnosis not present

## 2013-10-27 DIAGNOSIS — E1139 Type 2 diabetes mellitus with other diabetic ophthalmic complication: Secondary | ICD-10-CM | POA: Diagnosis not present

## 2013-10-27 DIAGNOSIS — E1169 Type 2 diabetes mellitus with other specified complication: Secondary | ICD-10-CM | POA: Diagnosis not present

## 2013-10-27 DIAGNOSIS — E785 Hyperlipidemia, unspecified: Secondary | ICD-10-CM | POA: Diagnosis not present

## 2013-10-30 DIAGNOSIS — E1159 Type 2 diabetes mellitus with other circulatory complications: Secondary | ICD-10-CM | POA: Diagnosis not present

## 2013-10-30 DIAGNOSIS — E559 Vitamin D deficiency, unspecified: Secondary | ICD-10-CM | POA: Diagnosis not present

## 2013-10-30 DIAGNOSIS — I2510 Atherosclerotic heart disease of native coronary artery without angina pectoris: Secondary | ICD-10-CM | POA: Diagnosis not present

## 2013-10-30 DIAGNOSIS — E782 Mixed hyperlipidemia: Secondary | ICD-10-CM | POA: Diagnosis not present

## 2013-10-30 DIAGNOSIS — E11339 Type 2 diabetes mellitus with moderate nonproliferative diabetic retinopathy without macular edema: Secondary | ICD-10-CM | POA: Diagnosis not present

## 2013-10-30 DIAGNOSIS — Z23 Encounter for immunization: Secondary | ICD-10-CM | POA: Diagnosis not present

## 2013-11-03 ENCOUNTER — Encounter: Payer: Self-pay | Admitting: Cardiology

## 2013-11-06 ENCOUNTER — Encounter: Payer: Self-pay | Admitting: Internal Medicine

## 2013-11-14 DIAGNOSIS — Z23 Encounter for immunization: Secondary | ICD-10-CM | POA: Diagnosis not present

## 2013-12-02 DIAGNOSIS — E11331 Type 2 diabetes mellitus with moderate nonproliferative diabetic retinopathy with macular edema: Secondary | ICD-10-CM | POA: Diagnosis not present

## 2013-12-02 DIAGNOSIS — H43813 Vitreous degeneration, bilateral: Secondary | ICD-10-CM | POA: Diagnosis not present

## 2013-12-02 DIAGNOSIS — E11339 Type 2 diabetes mellitus with moderate nonproliferative diabetic retinopathy without macular edema: Secondary | ICD-10-CM | POA: Diagnosis not present

## 2013-12-22 ENCOUNTER — Other Ambulatory Visit: Payer: Self-pay | Admitting: *Deleted

## 2013-12-22 MED ORDER — METOPROLOL SUCCINATE ER 50 MG PO TB24: 50.0000 mg | ORAL_TABLET | Freq: Every day | ORAL | Status: DC

## 2014-01-01 ENCOUNTER — Ambulatory Visit: Payer: Medicare Other | Admitting: Cardiovascular Disease

## 2014-01-01 ENCOUNTER — Ambulatory Visit (INDEPENDENT_AMBULATORY_CARE_PROVIDER_SITE_OTHER): Payer: Medicare Other | Admitting: Internal Medicine

## 2014-01-01 ENCOUNTER — Encounter: Payer: Self-pay | Admitting: Internal Medicine

## 2014-01-01 VITALS — BP 128/74 | HR 71 | Ht 74.0 in | Wt 217.0 lb

## 2014-01-01 DIAGNOSIS — R55 Syncope and collapse: Secondary | ICD-10-CM

## 2014-01-01 DIAGNOSIS — I259 Chronic ischemic heart disease, unspecified: Secondary | ICD-10-CM

## 2014-01-01 DIAGNOSIS — I480 Paroxysmal atrial fibrillation: Secondary | ICD-10-CM

## 2014-01-01 DIAGNOSIS — I472 Ventricular tachycardia: Secondary | ICD-10-CM

## 2014-01-01 DIAGNOSIS — J45909 Unspecified asthma, uncomplicated: Secondary | ICD-10-CM

## 2014-01-01 DIAGNOSIS — I4891 Unspecified atrial fibrillation: Secondary | ICD-10-CM | POA: Diagnosis not present

## 2014-01-01 DIAGNOSIS — Z4502 Encounter for adjustment and management of automatic implantable cardiac defibrillator: Secondary | ICD-10-CM | POA: Diagnosis not present

## 2014-01-01 DIAGNOSIS — I4729 Other ventricular tachycardia: Secondary | ICD-10-CM

## 2014-01-01 LAB — MDC_IDC_ENUM_SESS_TYPE_INCLINIC
Battery Remaining Longevity: 14.4 mo
Battery Voltage: 2.54 V
Brady Statistic RA Percent Paced: 91 %
Brady Statistic RV Percent Paced: 84 %
Date Time Interrogation Session: 20151203133055
HighPow Impedance: 44 Ohm
Implantable Pulse Generator Serial Number: 554225
Lead Channel Impedance Value: 412.5 Ohm
Lead Channel Impedance Value: 537.5 Ohm
Lead Channel Pacing Threshold Amplitude: 0.5 V
Lead Channel Pacing Threshold Amplitude: 0.5 V
Lead Channel Pacing Threshold Amplitude: 0.75 V
Lead Channel Pacing Threshold Amplitude: 0.75 V
Lead Channel Pacing Threshold Pulse Width: 0.5 ms
Lead Channel Pacing Threshold Pulse Width: 0.5 ms
Lead Channel Pacing Threshold Pulse Width: 0.5 ms
Lead Channel Pacing Threshold Pulse Width: 0.5 ms
Lead Channel Sensing Intrinsic Amplitude: 10 mV
Lead Channel Sensing Intrinsic Amplitude: 2.5 mV
Lead Channel Setting Pacing Amplitude: 2 V
Lead Channel Setting Pacing Amplitude: 2.5 V
Lead Channel Setting Pacing Pulse Width: 0.5 ms
Lead Channel Setting Sensing Sensitivity: 0.3 mV
Zone Setting Detection Interval: 250 ms
Zone Setting Detection Interval: 350 ms
Zone Setting Detection Interval: 425 ms

## 2014-01-01 LAB — POTASSIUM: Potassium: 4.1 mEq/L (ref 3.5–5.1)

## 2014-01-01 LAB — MAGNESIUM: Magnesium: 2.2 mg/dL (ref 1.5–2.5)

## 2014-01-01 NOTE — Progress Notes (Signed)
Patient Care Team: Abigail Miyamoto, MD as PCP - General (Family Medicine)   HPI  [name] is a 80 y.o. male  Seen in followup for polymorphic ventricular tachycardia for which he has an ICD and atrial fibrillation.  He was seen November/13 because of 2 episodes of polymorphic ventricular tachycardia occurring in the preceding few weeks after having been quiet for months and months. There is no clear inciting event. Beta blocker therapy was gently uptitrated    He also has a history of coronary disease with prior CABG mitral valve repair both of which were done at [facility] clinic at that time. he also had a PFO closure.  Is on dofetilide for his arrhythmias when he was seen last year complaints of increasing exercise intolerance and chest discomfort. He underwent catheterization 6/14 demonstrating patency of the LIMA to the LAD and RIMA to his obtuse marginal. Ejection fraction is 35-40%   Has had no significant problems with exercise tolerance. It turned out that he was treated with nebulizer therapy and has exertional chest discomfort and dyspnea resolved.   He has continued to have spells of weakness. He recorded his blood pressure on these occasions. On one occasion it was 98. His wife describes him as pale. On the second occasion of 138 but he knows that he had to get up and get the machine out of the closet so there were some interval of time.  This had one intercurrent episode of near syncope associated with nonsustained ventricular tachycardia.   Past Medical History  Diagnosis Date  . Coronary artery disease     [place] [facility] [facility]  . Ventricular tachycardia, polymorphic     status post ICD implantation  . Raynaud's disease   . ICD (implantable cardiac defibrillator) in place   . Diabetes mellitus   . Endocarditis, valve unspecified     Mitral  . PAF (paroxysmal atrial fibrillation)   . PVC's (premature ventricular contractions)   . History of  seasonal allergies   . History of migraine headaches   . Pleural effusion     Past Surgical History  Procedure Laterality Date  . Mitral valve replacement    . Coronary artery bypass graft  2001    2 VESSEL CABG AND MITRAL VALVE REPAIR. HE HAD A LIMA GRAFT TO THE LAD AND RADIAL ARTERY  GRAFT TO THE OBTUSE MARGINAL  OF LEFT CIRCUMFLEX  . Cardiac defibrillator placement    . Hemorroidectomy    . Knee arthroscopy      RIGHT KNEE  . Cardiac catheterization  04/03/2007    EF 40%  . Cardiac catheterization  02/06/2006    EF 45%. ANTERIOR HYPOKINESIS  . Cardiac catheterization  05/29/2000    EF 50%. MILD ANTERIOR HYPOKINESIS  . Cardiac catheterization  10/25/1999    EF 50%. SEVERE MITRAL REGURGITATION  . Cardiac catheterization  01/06/2003    EF 50%  . Transthoracic echocardiogram  04/02/2007    EF 35%    Current Outpatient Prescriptions  Medication Sig Dispense Refill  . aspirin 81 MG tablet Take 81 mg by mouth daily.      Marland Kitchen atorvastatin (LIPITOR) 80 MG tablet Take 1 tablet by mouth Daily.    . Cholecalciferol (VITAMIN D-3) 5000 UNITS TABS Take 5,000-10,000 Units by mouth daily. EVERY OTHER DAY. Take 1 tablet four times a WEEK and then take 2 tablets three times a WEEK.    . dofetilide ([name]) 500 MCG capsule Take 1 capsule (500 mcg  total) by mouth 2 (two) times daily. 180 capsule 2  . EPIPEN 2-PAK 0.3 MG/0.3ML SOAJ injection USE ONLY IN EMERGENCY    . fluticasone (FLONASE) 50 MCG/ACT nasal spray Place 1 spray into the nose daily as needed for rhinitis or allergies.    . Magnesium 250 MG TABS Take 250 mg by mouth daily.     . metoprolol succinate (TOPROL-XL) 50 MG 24 hr tablet Take 1 tablet (50 mg total) by mouth daily. 90 tablet 0  . Multiple Vitamin (MULTIVITAMIN WITH MINERALS) TABS Take 1 tablet by mouth daily.    . Multiple Vitamins-Minerals (PRESERVISION AREDS 2) CAPS Take 2 capsules by mouth daily.    . nateglinide (STARLIX) 120 MG tablet Take 1 tablet by mouth Twice daily.    .  pioglitazone (ACTOS) 45 MG tablet Take 45 mg by mouth daily.      . sitaGLIPtin (JANUVIA) 100 MG tablet Take 100 mg by mouth daily.      Marland Kitchen Umeclidinium-Vilanterol (ANORO ELLIPTA) 62.5-25 MCG/INH AEPB Inhale into the lungs daily.     No current facility-administered medications for this visit.    Allergies  Allergen Reactions  . Other     BEE STINGS   . Sotalol     "BLUE TOES"- cut his circulation off  . Warfarin Sodium     REACTION: migraine headaches/vision impariment    Review of Systems negative except from HPI and PMH  Physical Exam BP 128/74 mmHg  Pulse 71  Ht 6\' 2"  (1.88 m)  Wt 217 lb (98.431 kg)  BMI 27.85 kg/m2 Well developed and well nourished in no acute distress HENT normal E scleral and icterus clear Neck Supple JVP flat; carotids brisk and full Clear to ausculation  Regular rate and rhythm, no murmurs gallops or rub Soft with active bowel sounds No clubbing cyanosis pitting, non-pitting  Edema Alert and oriented, grossly normal motor and sensory function Skin Warm and Dry    Assessment and  Plan  Ischemic heart disease with prior bypass  Ischemic cardiomyopathy ejection 35-40%  Congestive heart failure-chronic-systolic  Reactive airways disease much improved  Implantable defibrillator-St. Jude The patient's device was interrogated.  The information was reviewed.  Device was reprogrammed as below  Presyncope  Atrial fibrillation on dofetilide   Ventricular tachycardia nonsustained  he has had intercurrent nonsustained ventricular tachycardia which is quite symptomatic. We will reprogram his device to allow for antitachycardia pacing for this ventricular tachycardia. We have reviewed the implications of the MADIT RIT trial in this regard The patient is having no symptoms of angina at this point. He is euvolemic   Exercise intolerance turns out 2/2  reactive airways disease is vastly improved.  The episodes of presyncope associated with  diaphoresis are likely going to be vasomotor. He will continue to survey to see if we can't get more data that is clarifying is currently we had 2 episodes with quite different blood pressure readings.  He continues to have episodes of atrial fibrillation;  They have become increasingly long and has now had a number of episodes of greater than 2-8 hours. We discussed the use of the NOACs compared to Coumadin. We briefly reviewed the data of at least comparability in stroke prevention, bleeding and outcome. We discussed some of the new once wherein somewhat associated with decreased ischemic stroke risk, one to be taken daily, and has been shown to be comparable and bleeding risk to aspirin.  We also discussed bleeding associated with warfarin as well as NOACs  and a wall bleeding as a complication of all these drugs intracranial bleeding is more frequently associated with warfarin then the NOACs and a GI bleeding is more commonly associated with the latter    .

## 2014-01-01 NOTE — Patient Instructions (Signed)
Your physician recommends that you continue on your current medications as directed. Please refer to the Current Medication list given to you today.  Labs today: Magnesium, Potassium  Remote monitoring is used to monitor your Pacemaker of ICD from home. This monitoring reduces the number of office visits required to check your device to one time per year. It allows [name] to keep an eye on the functioning of your device to ensure it is working properly. You are scheduled for a device check from home on 04/01/14. You may send your transmission at any time that day. If you have a wireless device, the transmission will be sent automatically. After your physician reviews your transmission, you will receive a postcard with your next transmission date.  Your physician wants you to follow-up in: 6 months with Dr. Caryl Comes. You will receive a reminder letter in the mail two months in advance. If you don't receive a letter, please call our office to schedule the follow-up appointment.

## 2014-01-02 ENCOUNTER — Telehealth: Payer: Self-pay | Admitting: *Deleted

## 2014-01-02 NOTE — Telephone Encounter (Signed)
pt notified about normal lab results with verbal understanding 

## 2014-01-05 ENCOUNTER — Other Ambulatory Visit: Payer: Self-pay | Admitting: Internal Medicine

## 2014-01-05 DIAGNOSIS — J449 Chronic obstructive pulmonary disease, unspecified: Secondary | ICD-10-CM | POA: Diagnosis not present

## 2014-01-08 ENCOUNTER — Encounter (HOSPITAL_COMMUNITY): Payer: Self-pay | Admitting: Cardiovascular Disease

## 2014-01-08 DIAGNOSIS — J0100 Acute maxillary sinusitis, unspecified: Secondary | ICD-10-CM | POA: Diagnosis not present

## 2014-01-16 ENCOUNTER — Other Ambulatory Visit: Payer: Self-pay | Admitting: *Deleted

## 2014-01-16 DIAGNOSIS — I480 Paroxysmal atrial fibrillation: Secondary | ICD-10-CM

## 2014-01-19 ENCOUNTER — Other Ambulatory Visit (INDEPENDENT_AMBULATORY_CARE_PROVIDER_SITE_OTHER): Payer: Medicare Other | Admitting: *Deleted

## 2014-01-19 DIAGNOSIS — I480 Paroxysmal atrial fibrillation: Secondary | ICD-10-CM | POA: Diagnosis not present

## 2014-01-19 LAB — BASIC METABOLIC PANEL
BUN: 13 mg/dL (ref 6–23)
CO2: 27 mEq/L (ref 19–32)
Calcium: 8.9 mg/dL (ref 8.4–10.5)
Chloride: 102 mEq/L (ref 96–112)
Creatinine, Ser: 1 mg/dL (ref 0.4–1.5)
GFR: 80.04 mL/min (ref >60.00)
Glucose, Bld: 203 mg/dL — ABNORMAL HIGH (ref 70–99)
Potassium: 4.3 mEq/L (ref 3.5–5.1)
Sodium: 135 mEq/L (ref 135–145)

## 2014-03-04 ENCOUNTER — Telehealth: Payer: Self-pay | Admitting: Internal Medicine

## 2014-03-04 NOTE — Telephone Encounter (Signed)
Patient tells me all is ok now. His pharmacy had told him that they did not have [name], that it was unavailable from the manufacturer. However, they were fortunately wrong and his rx is filled and ready.

## 2014-03-04 NOTE — Telephone Encounter (Signed)
New Msg         Pt calling states he would like to discuss his medications with the nurse.    Please return pt call.

## 2014-04-01 ENCOUNTER — Ambulatory Visit (INDEPENDENT_AMBULATORY_CARE_PROVIDER_SITE_OTHER): Payer: Medicare Other | Admitting: *Deleted

## 2014-04-01 ENCOUNTER — Encounter: Payer: Self-pay | Admitting: Internal Medicine

## 2014-04-01 DIAGNOSIS — I472 Ventricular tachycardia: Secondary | ICD-10-CM | POA: Diagnosis not present

## 2014-04-01 DIAGNOSIS — I4729 Other ventricular tachycardia: Secondary | ICD-10-CM

## 2014-04-01 LAB — MDC_IDC_ENUM_SESS_TYPE_REMOTE
Battery Remaining Longevity: 9 mo
Battery Voltage: 2.53 V
Brady Statistic AP VP Percent: 76 %
Brady Statistic AP VS Percent: 17 %
Brady Statistic AS VP Percent: 2.4 %
Brady Statistic AS VS Percent: 1.9 %
Brady Statistic RA Percent Paced: 90 %
Brady Statistic RV Percent Paced: 78 %
Date Time Interrogation Session: 20160302081358
HighPow Impedance: 41 Ohm
Implantable Pulse Generator Serial Number: 554225
Lead Channel Impedance Value: 380 Ohm
Lead Channel Impedance Value: 450 Ohm
Lead Channel Pacing Threshold Amplitude: 0.5 V
Lead Channel Pacing Threshold Amplitude: 0.75 V
Lead Channel Pacing Threshold Pulse Width: 0.5 ms
Lead Channel Pacing Threshold Pulse Width: 0.5 ms
Lead Channel Sensing Intrinsic Amplitude: 2.2 mV
Lead Channel Sensing Intrinsic Amplitude: 7.5 mV
Lead Channel Setting Pacing Amplitude: 2 V
Lead Channel Setting Pacing Amplitude: 2.5 V
Lead Channel Setting Pacing Pulse Width: 0.5 ms
Lead Channel Setting Sensing Sensitivity: 0.3 mV
Zone Setting Detection Interval: 250 ms
Zone Setting Detection Interval: 350 ms
Zone Setting Detection Interval: 425 ms

## 2014-04-01 NOTE — Progress Notes (Signed)
Remote ICD transmission.   

## 2014-04-28 DIAGNOSIS — E559 Vitamin D deficiency, unspecified: Secondary | ICD-10-CM | POA: Diagnosis not present

## 2014-04-28 DIAGNOSIS — E782 Mixed hyperlipidemia: Secondary | ICD-10-CM | POA: Diagnosis not present

## 2014-04-28 DIAGNOSIS — E1159 Type 2 diabetes mellitus with other circulatory complications: Secondary | ICD-10-CM | POA: Diagnosis not present

## 2014-04-28 DIAGNOSIS — I480 Paroxysmal atrial fibrillation: Secondary | ICD-10-CM | POA: Diagnosis not present

## 2014-04-30 DIAGNOSIS — E559 Vitamin D deficiency, unspecified: Secondary | ICD-10-CM | POA: Diagnosis not present

## 2014-04-30 DIAGNOSIS — I2510 Atherosclerotic heart disease of native coronary artery without angina pectoris: Secondary | ICD-10-CM | POA: Diagnosis not present

## 2014-04-30 DIAGNOSIS — I480 Paroxysmal atrial fibrillation: Secondary | ICD-10-CM | POA: Diagnosis not present

## 2014-04-30 DIAGNOSIS — E782 Mixed hyperlipidemia: Secondary | ICD-10-CM | POA: Diagnosis not present

## 2014-04-30 DIAGNOSIS — E11339 Type 2 diabetes mellitus with moderate nonproliferative diabetic retinopathy without macular edema: Secondary | ICD-10-CM | POA: Diagnosis not present

## 2014-04-30 DIAGNOSIS — E1159 Type 2 diabetes mellitus with other circulatory complications: Secondary | ICD-10-CM | POA: Diagnosis not present

## 2014-05-05 ENCOUNTER — Encounter: Payer: Self-pay | Admitting: Cardiology

## 2014-05-05 DIAGNOSIS — L821 Other seborrheic keratosis: Secondary | ICD-10-CM | POA: Diagnosis not present

## 2014-05-05 DIAGNOSIS — L820 Inflamed seborrheic keratosis: Secondary | ICD-10-CM | POA: Diagnosis not present

## 2014-05-05 DIAGNOSIS — D0472 Carcinoma in situ of skin of left lower limb, including hip: Secondary | ICD-10-CM | POA: Diagnosis not present

## 2014-05-05 DIAGNOSIS — L570 Actinic keratosis: Secondary | ICD-10-CM | POA: Diagnosis not present

## 2014-05-05 DIAGNOSIS — D1801 Hemangioma of skin and subcutaneous tissue: Secondary | ICD-10-CM | POA: Diagnosis not present

## 2014-05-11 DIAGNOSIS — J449 Chronic obstructive pulmonary disease, unspecified: Secondary | ICD-10-CM | POA: Diagnosis not present

## 2014-06-30 ENCOUNTER — Other Ambulatory Visit: Payer: Self-pay | Admitting: Internal Medicine

## 2014-07-08 ENCOUNTER — Encounter: Payer: Self-pay | Admitting: Internal Medicine

## 2014-07-08 ENCOUNTER — Ambulatory Visit (INDEPENDENT_AMBULATORY_CARE_PROVIDER_SITE_OTHER): Payer: Medicare Other | Admitting: Internal Medicine

## 2014-07-08 VITALS — BP 100/56 | HR 69 | Ht 74.0 in | Wt 216.0 lb

## 2014-07-08 DIAGNOSIS — I4729 Other ventricular tachycardia: Secondary | ICD-10-CM

## 2014-07-08 DIAGNOSIS — Z79899 Other long term (current) drug therapy: Secondary | ICD-10-CM

## 2014-07-08 DIAGNOSIS — I472 Ventricular tachycardia: Secondary | ICD-10-CM

## 2014-07-08 DIAGNOSIS — I480 Paroxysmal atrial fibrillation: Secondary | ICD-10-CM

## 2014-07-08 DIAGNOSIS — Z4502 Encounter for adjustment and management of automatic implantable cardiac defibrillator: Secondary | ICD-10-CM

## 2014-07-08 LAB — CUP PACEART INCLINIC DEVICE CHECK
Battery Remaining Longevity: 3.9 mo
Battery Voltage: 2.5 V
Brady Statistic RA Percent Paced: 86 %
Brady Statistic RV Percent Paced: 74 %
Date Time Interrogation Session: 20160608143750
HighPow Impedance: 44 Ohm
Lead Channel Impedance Value: 412.5 Ohm
Lead Channel Impedance Value: 487.5 Ohm
Lead Channel Pacing Threshold Amplitude: 0.5 V
Lead Channel Pacing Threshold Amplitude: 0.5 V
Lead Channel Pacing Threshold Amplitude: 0.75 V
Lead Channel Pacing Threshold Amplitude: 0.75 V
Lead Channel Pacing Threshold Pulse Width: 0.5 ms
Lead Channel Pacing Threshold Pulse Width: 0.5 ms
Lead Channel Pacing Threshold Pulse Width: 0.5 ms
Lead Channel Pacing Threshold Pulse Width: 0.5 ms
Lead Channel Sensing Intrinsic Amplitude: 11.8 mV
Lead Channel Sensing Intrinsic Amplitude: 2.3 mV
Lead Channel Setting Pacing Amplitude: 2 V
Lead Channel Setting Pacing Amplitude: 2.5 V
Lead Channel Setting Pacing Pulse Width: 0.5 ms
Lead Channel Setting Sensing Sensitivity: 0.3 mV
Pulse Gen Serial Number: 554225
Zone Setting Detection Interval: 250 ms
Zone Setting Detection Interval: 350 ms
Zone Setting Detection Interval: 425 ms

## 2014-07-08 LAB — BASIC METABOLIC PANEL
BUN: 17 mg/dL (ref 6–23)
CO2: 30 mEq/L (ref 19–32)
Calcium: 9.4 mg/dL (ref 8.4–10.5)
Chloride: 100 mEq/L (ref 96–112)
Creatinine, Ser: 1.06 mg/dL (ref 0.40–1.50)
GFR: 71.31 mL/min (ref >60.00)
Glucose, Bld: 146 mg/dL — ABNORMAL HIGH (ref 70–99)
Potassium: 4.6 mEq/L (ref 3.5–5.1)
Sodium: 133 mEq/L — ABNORMAL LOW (ref 135–145)

## 2014-07-08 LAB — MAGNESIUM: Magnesium: 2.2 mg/dL (ref 1.5–2.5)

## 2014-07-08 MED ORDER — DOFETILIDE 500 MCG PO CAPS: 500.0000 ug | ORAL_CAPSULE | Freq: Two times a day (BID) | ORAL | Status: DC

## 2014-07-08 NOTE — Progress Notes (Signed)
Patient Care Team: Briscoe Deutscher, MD as PCP - General (Family Medicine)   HPI  [name] is a 81 y.o. male  Seen in followup for polymorphic ventricular tachycardia for which he has an ICD and atrial fibrillation.  He was seen November/13 because of 2 episodes of polymorphic ventricular tachycardia occurring in the preceding few weeks after having been quiet for months and months. There is no clear inciting event. Beta blocker therapy was gently uptitrated    He also has a history of coronary disease with prior CABG mitral valve repair both of which were done at [facility] clinic at that time. he also had a PFO closure.  Is on dofetilide for his arrhythmias when he was seen last year complaints of increasing exercise intolerance and chest discomfort. He underwent catheterization 6/14 demonstrating patency of the LIMA to the LAD and RIMA to his obtuse marginal. Ejection fraction is 35-40%   Has had no significant problems with exercise tolerance. It turned out that he was treated with nebulizer therapy and has exertional chest discomfort and dyspnea resolved.   He has continued to have spells of weakness. He recorded his blood pressure on these occasions. On one occasion it was 98. His wife describes him as pale. On the second occasion of 138 but he knows that he had to get up and get the machine out of the closet so there were some interval of time.  This had one intercurrent episode of near syncope associated with nonsustained ventricular tachycardia.   Past Medical History  Diagnosis Date  . [place] artery disease     [place] [facility]  . Ventricular tachycardia, polymorphic     status post ICD implantation  . Raynaud's disease   . ICD (implantable cardiac defibrillator) in place   . Diabetes mellitus   . Endocarditis, valve unspecified     Mitral  . PAF (paroxysmal atrial fibrillation)   . PVC's (premature ventricular contractions)   . History of seasonal  allergies   . History of migraine headaches   . Pleural effusion     Past Surgical History  Procedure Laterality Date  . Mitral valve replacement    . Coronary artery bypass graft  2001    2 VESSEL CABG AND MITRAL VALVE REPAIR. HE HAD A LIMA GRAFT TO THE LAD AND RADIAL ARTERY  GRAFT TO THE OBTUSE MARGINAL  OF LEFT CIRCUMFLEX  . Cardiac defibrillator placement    . Hemorroidectomy    . Knee arthroscopy      RIGHT KNEE  . Cardiac catheterization  04/03/2007    EF 40%  . Cardiac catheterization  02/06/2006    EF 45%. ANTERIOR HYPOKINESIS  . Cardiac catheterization  05/29/2000    EF 50%. MILD ANTERIOR HYPOKINESIS  . Cardiac catheterization  10/25/1999    EF 50%. SEVERE MITRAL REGURGITATION  . Cardiac catheterization  01/06/2003    EF 50%  . Transthoracic echocardiogram  04/02/2007    EF 35%  . Left heart catheterization with coronary angiogram N/A 07/04/2012    Procedure: LEFT HEART CATHETERIZATION WITH CORONARY ANGIOGRAM;  Surgeon: Sherren Mocha, MD;  Location: [facility] CATH LAB;  Service: Cardiovascular;  Laterality: N/A;    Current Outpatient Prescriptions  Medication Sig Dispense Refill  . aspirin 81 MG tablet Take 81 mg by mouth daily.      Marland Kitchen atorvastatin (LIPITOR) 80 MG tablet Take 1 tablet by mouth Daily.    . Cholecalciferol (VITAMIN D-3) 5000 UNITS TABS Take 5,000-10,000 Units by  mouth daily. EVERY OTHER DAY. Take 1 tablet four times a WEEK and then take 2 tablets three times a WEEK.    . dofetilide ([name]) 500 MCG capsule Take 1 capsule (500 mcg total) by mouth 2 (two) times daily. 180 capsule 2  . EPIPEN 2-PAK 0.3 MG/0.3ML SOAJ injection USE ONLY IN EMERGENCY    . fluticasone (FLONASE) 50 MCG/ACT nasal spray Place 1 spray into the nose daily as needed for rhinitis or allergies.    . Magnesium 250 MG TABS Take 250 mg by mouth daily.     . metoprolol succinate (TOPROL-XL) 50 MG 24 hr tablet TAKE 1 TABLET (50 MG TOTAL) BY MOUTH DAILY. 90 tablet 1  . Multiple Vitamin (MULTIVITAMIN WITH  MINERALS) TABS Take 1 tablet by mouth daily.    . Multiple Vitamins-Minerals (PRESERVISION AREDS 2) CAPS Take 2 capsules by mouth daily.    . nateglinide (STARLIX) 120 MG tablet Take 1 tablet by mouth Twice daily.    . pioglitazone (ACTOS) 45 MG tablet Take 45 mg by mouth daily.      . sitaGLIPtin (JANUVIA) 100 MG tablet Take 100 mg by mouth daily.      Marland Kitchen Umeclidinium-Vilanterol (ANORO ELLIPTA) 62.5-25 MCG/INH AEPB Inhale into the lungs daily.     No current facility-administered medications for this visit.    Allergies  Allergen Reactions  . Other     BEE STINGS   . Sotalol     "BLUE TOES"- cut his circulation off  . Warfarin Sodium     REACTION: migraine headaches/vision impariment    Review of Systems negative except from HPI and PMH  Physical Exam BP 100/56 mmHg  Pulse 69  Ht 6\' 2"  (1.88 m)  Wt 216 lb (97.977 kg)  BMI 27.72 kg/m2 Well developed and well nourished in no acute distress HENT normal E scleral and icterus clear Neck Supple JVP flat; carotids brisk and full Clear to ausculation  Regular rate and rhythm, no murmurs gallops or rub Soft with active bowel sounds No clubbing cyanosis pitting, non-pitting  Edema Alert and oriented, grossly normal motor and sensory function Skin Warm and Dry    Assessment and  Plan  Ischemic heart disease with prior bypass  Ischemic cardiomyopathy ejection 35-40%  Congestive heart failure-chronic-systolic  Reactive airways disease much improved  Implantable defibrillator-[employer] The patient's device was interrogated.  The information was reviewed.  Device was reprogrammed as below  Presyncope  Atrial fibrillation on dofetilide   Ventricular tachycardia nonsustained  he has had intercurrent nonsustained ventricular tachycardia which is quite symptomatic. We will reprogram his device to allow for antitachycardia pacing for this ventricular tachycardia. We have reviewed the implications of the MADIT RIT trial in this  regard The patient is having no symptoms of angina at this point.     Marland Kitchen

## 2014-07-08 NOTE — Patient Instructions (Signed)
Medication Instructions:  Your physician recommends that you continue on your current medications as directed. Please refer to the Current Medication list given to you today.  Labwork: Today: Magnesium & BMET  Testing/Procedures: None ordered  Follow-Up: Monthly remote battery checks  Thank you for choosing [facility]!!

## 2014-07-27 ENCOUNTER — Other Ambulatory Visit: Payer: Self-pay

## 2014-08-04 ENCOUNTER — Telehealth: Payer: Self-pay | Admitting: Internal Medicine

## 2014-08-04 MED ORDER — DOFETILIDE 250 MCG PO CAPS: 500.0000 ug | ORAL_CAPSULE | Freq: Two times a day (BID) | ORAL | Status: DC

## 2014-08-04 NOTE — Telephone Encounter (Signed)
New message    Pt C/O medication issue:  1. Name of Medication: [name] 500 mg   2. How are you currently taking this medication (dosage and times per day)?  Twice  A day   3. Are you having a reaction (difficulty breathing--STAT)? No   4. What is your medication issue? Went on vacation forgot his medication. [place] has 200 mg quantity of 14 pills   [facility]  [phone]

## 2014-08-04 NOTE — Telephone Encounter (Signed)
[place] at beach, only brought 1/2 of his medication, will run out after tonight's dose.  Pt called multiple pharmacies, [facility] 250 mcg [facility].      Rx sent to [facility] .  Pt voiced understanding

## 2014-08-07 ENCOUNTER — Telehealth: Payer: Self-pay | Admitting: Cardiology

## 2014-08-07 ENCOUNTER — Ambulatory Visit (INDEPENDENT_AMBULATORY_CARE_PROVIDER_SITE_OTHER): Payer: Medicare Other | Admitting: *Deleted

## 2014-08-07 DIAGNOSIS — I472 Ventricular tachycardia: Secondary | ICD-10-CM

## 2014-08-07 DIAGNOSIS — I4729 Other ventricular tachycardia: Secondary | ICD-10-CM

## 2014-08-07 NOTE — Telephone Encounter (Signed)
LMOVM reminding pt to send remote transmission.   

## 2014-08-09 ENCOUNTER — Encounter: Payer: Self-pay | Admitting: Internal Medicine

## 2014-08-10 LAB — CUP PACEART REMOTE DEVICE CHECK
Battery Remaining Longevity: 1 mo
Battery Voltage: 2.47 V
Brady Statistic AP VP Percent: 71 %
Brady Statistic AP VS Percent: 18 %
Brady Statistic AS VP Percent: 3.4 %
Brady Statistic AS VS Percent: 1.7 %
Brady Statistic RA Percent Paced: 82 %
Brady Statistic RV Percent Paced: 74 %
Date Time Interrogation Session: 20160710045659
HighPow Impedance: 38 Ohm
Lead Channel Impedance Value: 390 Ohm
Lead Channel Impedance Value: 480 Ohm
Lead Channel Sensing Intrinsic Amplitude: 3.6 mV
Lead Channel Sensing Intrinsic Amplitude: 4.4 mV
Lead Channel Setting Pacing Amplitude: 2 V
Lead Channel Setting Pacing Amplitude: 2.5 V
Lead Channel Setting Pacing Pulse Width: 0.5 ms
Lead Channel Setting Sensing Sensitivity: 0.3 mV
Pulse Gen Serial Number: 554225
Zone Setting Detection Interval: 250 ms
Zone Setting Detection Interval: 350 ms
Zone Setting Detection Interval: 425 ms

## 2014-08-10 NOTE — Progress Notes (Signed)
Remote ICD transmission.   

## 2014-09-04 ENCOUNTER — Encounter: Payer: Self-pay | Admitting: Cardiology

## 2014-09-05 ENCOUNTER — Other Ambulatory Visit: Payer: Self-pay | Admitting: Internal Medicine

## 2014-09-07 ENCOUNTER — Other Ambulatory Visit: Payer: Self-pay | Admitting: Internal Medicine

## 2014-09-10 ENCOUNTER — Ambulatory Visit (INDEPENDENT_AMBULATORY_CARE_PROVIDER_SITE_OTHER): Payer: Medicare Other | Admitting: *Deleted

## 2014-09-10 ENCOUNTER — Encounter: Payer: Self-pay | Admitting: Internal Medicine

## 2014-09-10 DIAGNOSIS — I4729 Other ventricular tachycardia: Secondary | ICD-10-CM

## 2014-09-10 DIAGNOSIS — I472 Ventricular tachycardia: Secondary | ICD-10-CM | POA: Diagnosis not present

## 2014-09-10 NOTE — Progress Notes (Signed)
Remote ICD transmission.   

## 2014-09-14 ENCOUNTER — Telehealth: Payer: Self-pay | Admitting: Internal Medicine

## 2014-09-14 NOTE — Telephone Encounter (Signed)
Routing to device clinic to address 

## 2014-09-14 NOTE — Telephone Encounter (Signed)
New Message   Pt wants to inform you that his defibrillator has vibrated  And he ask that you give him a call

## 2014-09-14 NOTE — Telephone Encounter (Signed)
Pt recalls vibratory alert on 09/11/14. Pt states it only happened once. I confirmed we did receive alert from [employer]. I offered pt to make appt w/ device clinic to turn off alert notifier, pt declined at this time. Pt aware if he changes his mind, he may make appt w/ device clinic. Appt made on 10/14/14 @2 :40pm w/ Amber to discuss changeout. Pt aware of appt date & time.

## 2014-09-16 LAB — CUP PACEART REMOTE DEVICE CHECK
Battery Remaining Longevity: 1 mo
Battery Voltage: 2.45 V
Brady Statistic AP VP Percent: 70 %
Brady Statistic AP VS Percent: 18 %
Brady Statistic AS VP Percent: 3.4 %
Brady Statistic AS VS Percent: 1.7 %
Brady Statistic RA Percent Paced: 81 %
Brady Statistic RV Percent Paced: 72 %
Date Time Interrogation Session: 20160811070747
HighPow Impedance: 43 Ohm
Lead Channel Impedance Value: 390 Ohm
Lead Channel Impedance Value: 480 Ohm
Lead Channel Pacing Threshold Amplitude: 0.5 V
Lead Channel Pacing Threshold Amplitude: 0.75 V
Lead Channel Pacing Threshold Pulse Width: 0.5 ms
Lead Channel Pacing Threshold Pulse Width: 0.5 ms
Lead Channel Sensing Intrinsic Amplitude: 10.9 mV
Lead Channel Sensing Intrinsic Amplitude: 3.4 mV
Lead Channel Setting Pacing Amplitude: 2 V
Lead Channel Setting Pacing Amplitude: 2.5 V
Lead Channel Setting Pacing Pulse Width: 0.5 ms
Lead Channel Setting Sensing Sensitivity: 0.3 mV
Pulse Gen Serial Number: 554225
Zone Setting Detection Interval: 250 ms
Zone Setting Detection Interval: 350 ms
Zone Setting Detection Interval: 425 ms

## 2014-10-09 ENCOUNTER — Encounter: Payer: Self-pay | Admitting: Cardiology

## 2014-10-13 NOTE — Progress Notes (Signed)
Electrophysiology Office Note Date: 10/14/2014  ID:  [name], DOB January 28, 1932, MRN [id number]  PCP: Abigail Miyamoto, MD Electrophysiologist: Caryl Comes  CC: Routine ICD follow-up  [name] is a 81 y.o. male seen today for Dr Caryl Comes.  His device was found to be at [facility] and he presents today for discussion of ICD generator change.  At last office visit with Dr Caryl Comes in June 2016, his device was reprogrammed for VT below his detection zone that was asymptomatic.  Since last being seen in our clinic, the patient reports doing very well.  He denies chest pain, dyspnea, PND, orthopnea, nausea, vomiting, dizziness, syncope, edema, weight gain, or early satiety.  He has not had ICD shocks. He does have some tachycardia with exertion which has been identified as short episodes of atrial flutter.  He asks about a device with atrial therapy capability at change out.   Last cath 2014 demonstrated 2V CAD with severe stenosis of proximal LAD and continued patency of LAD, moderate stenosis of the prox circ with heavy calcification and continue patency of free RIMA to obtuse marginal graft; EF 35-40%.   Device History: STJ dual chamber ICD implanted 2004 for PMVT, ICM; generator change 2010 History of appropriate therapy: Yes History of AAD therapy: Yes   Past Medical History  Diagnosis Date  . Coronary artery disease     [place] [facility]  . Ventricular tachycardia, polymorphic     status post ICD implantation  . Raynaud's disease   . ICD (implantable cardiac defibrillator) in place   . Diabetes mellitus   . Endocarditis, valve unspecified     Mitral  . PAF (paroxysmal atrial fibrillation)   . PVC's (premature ventricular contractions)   . History of seasonal allergies   . History of migraine headaches   . Pleural effusion    Past Surgical History  Procedure Laterality Date  . Mitral valve replacement    . Coronary artery bypass graft  2001    2 VESSEL CABG AND  MITRAL VALVE REPAIR. HE HAD A LIMA GRAFT TO THE LAD AND RADIAL ARTERY  GRAFT TO THE OBTUSE MARGINAL  OF LEFT CIRCUMFLEX  . Cardiac defibrillator placement    . Hemorroidectomy    . Knee arthroscopy      RIGHT KNEE  . Cardiac catheterization  04/03/2007    EF 40%  . Cardiac catheterization  02/06/2006    EF 45%. ANTERIOR HYPOKINESIS  . Cardiac catheterization  05/29/2000    EF 50%. MILD ANTERIOR HYPOKINESIS  . Cardiac catheterization  10/25/1999    EF 50%. SEVERE MITRAL REGURGITATION  . Cardiac catheterization  01/06/2003    EF 50%  . Transthoracic echocardiogram  04/02/2007    EF 35%  . Left heart catheterization with coronary angiogram N/A 07/04/2012    Procedure: LEFT HEART CATHETERIZATION WITH CORONARY ANGIOGRAM;  Surgeon: Sherren Mocha, MD;  Location: [facility] CATH LAB;  Service: Cardiovascular;  Laterality: N/A;    Current Outpatient Prescriptions  Medication Sig Dispense Refill  . aspirin 81 MG tablet Take 81 mg by mouth daily.      Marland Kitchen atorvastatin (LIPITOR) 80 MG tablet Take 1 tablet by mouth Daily.    . Cholecalciferol (VITAMIN D-3) 5000 UNITS TABS Take 5,000-10,000 Units by mouth daily. EVERY OTHER DAY. Take 1 tablet four times a WEEK and then take 2 tablets three times a WEEK.    . dofetilide ([name]) 250 MCG capsule Take 2 capsules (500 mcg total) by mouth  2 (two) times daily. 14 capsule 0  . EPIPEN 2-PAK 0.3 MG/0.3ML SOAJ injection USE ONLY IN EMERGENCY    . fluticasone (FLONASE) 50 MCG/ACT nasal spray Place 1 spray into the nose daily as needed for rhinitis or allergies.    . Magnesium 250 MG TABS Take 250 mg by mouth daily.     . metoprolol succinate (TOPROL-XL) 50 MG 24 hr tablet TAKE 1 TABLET (50 MG TOTAL) BY MOUTH DAILY. 90 tablet 1  . Multiple Vitamin (MULTIVITAMIN WITH MINERALS) TABS Take 1 tablet by mouth daily.    . Multiple Vitamins-Minerals (PRESERVISION AREDS 2) CAPS Take 2 capsules by mouth daily.    . nateglinide (STARLIX) 120 MG tablet Take 1 tablet by mouth Twice daily.     . pioglitazone (ACTOS) 45 MG tablet Take 45 mg by mouth daily.      . sitaGLIPtin (JANUVIA) 100 MG tablet Take 100 mg by mouth daily.      Marland Kitchen Umeclidinium-Vilanterol (ANORO ELLIPTA) 62.5-25 MCG/INH AEPB Inhale into the lungs daily.     No current facility-administered medications for this visit.    Allergies:   Other; Sotalol; and Warfarin sodium   Social History: Social History   Social History  . Marital Status: Married    Spouse Name: N/A  . Number of Children: N/A  . Years of Education: N/A   Occupational History  . Not on file.   Social History Main Topics  . Smoking status: Former Smoker    Quit date: 01/31/1983  . Smokeless tobacco: Never Used  . Alcohol Use: 0.0 oz/week    0 Standard drinks or equivalent per week     Comment: 1-2 a week  . Drug Use: No  . Sexual Activity: Not on file   Other Topics Concern  . Not on file   Social History Narrative    Family History: Family History  Problem Relation Age of Onset  . Heart disease Father   . Emphysema Father   . Cancer Sister     breast cancer  . Heart disease Paternal Grandmother   . Heart disease Paternal Grandfather   . Diabetes Other     grandmother, aunt, uncle  . Cancer Other     lung cancer, aunt  . Stroke Mother     Review of Systems: General: No chills, fever, night sweats or weight changes  Cardiovascular:  No chest pain, dyspnea on exertion, edema, orthopnea, palpitations, paroxysmal nocturnal dyspnea; denies ICD shocks Dermatological: No rash, lesions or masses Respiratory: No cough, dyspnea Urologic: No hematuria, dysuria Abdominal: No nausea, vomiting, diarrhea, bright red blood per rectum, melena, or hematemesis Neurologic: No visual changes, weakness, changes in mental status All other systems reviewed and are otherwise negative except as noted above.   Physical Exam: VS:  BP 90/62 mmHg  Pulse 66  Ht 6\' 2"  (1.88 m)  Wt 191 lb (86.637 kg)  BMI 24.51 kg/m2 , BMI Body mass index  is 24.51 kg/(m^2).  GEN- The patient is well appearing, alert and oriented x 3 today.   HEENT: normocephalic, atraumatic; sclera clear, conjunctiva pink; hearing intact; oropharynx clear; neck supple Lungs- Clear to ausculation bilaterally, normal work of breathing.  No wheezes, rales, rhonchi Heart- Regular rate and rhythm (paced) GI- soft, non-tender, non-distended, bowel sounds present Extremities- no clubbing, cyanosis, or edema; DP/PT/radial pulses 2+ bilaterally MS- no significant deformity or atrophy Skin- warm and dry, no rash or lesion; ICD pocket well healed Psych- euthymic mood, full affect Neuro- strength and  sensation are intact  ICD interrogation- reviewed in detail today,  See PACEART report  EKG:  EKG is ordered today. The ekg ordered today shows AV pacing  Recent Labs: 07/08/2014: BUN 17; Creatinine, Ser 1.06; Magnesium 2.2; Potassium 4.6; Sodium 133*   Wt Readings from Last 3 Encounters:  10/14/14 191 lb (86.637 kg)  07/08/14 216 lb (97.977 kg)  01/01/14 217 lb (98.431 kg)     Other studies Reviewed: Additional studies/ records that were reviewed today include: Dr Olin Pia office notes, last echo  Assessment and Plan:  1.  Chronic systolic dysfunction euvolemic today Stable on an appropriate medical regimen Normal ICD function - device at elective replacement indicator.  Risks, benefits of generator change discussed with the patient who wishes to proceed. Will scheduled with Dr Caryl Comes at next available time. He has a high percentage of RV pacing with LV dysfunction but his HF is well compensated.  See Pace Art report No changes today  2.  ICM/CAD No recent ischemic symptoms Continue medical therapy  3.  Ventricular tachycardia No recent recurrence  4.  Paroxysmal atrial fibrillation Continue [name] BMET, Mg with pre-procedure labs QTc by EKG today stable Not currently on [facility] for unclear reasons, will discuss with Dr Caryl Comes [facility] score is at least  4 He asks for a device with atrial therapies at change out. Discussed with Dr Caryl Comes, will change to [employer] device.    Current medicines are reviewed at length with the patient today.   The patient does not have concerns regarding his medicines.  The following changes were made today:  none  Labs/ tests ordered today include: pre-procedure labs    Disposition:   Follow up with Dr Caryl Comes following generator change    Signed, Chanetta Marshall, NP 10/14/2014 8:56 PM  [facility] [address] [place] [address] [phone] (office) [phone] (fax)

## 2014-10-14 ENCOUNTER — Ambulatory Visit (INDEPENDENT_AMBULATORY_CARE_PROVIDER_SITE_OTHER): Payer: Medicare Other | Admitting: Nurse Practitioner

## 2014-10-14 ENCOUNTER — Encounter: Payer: Self-pay | Admitting: *Deleted

## 2014-10-14 ENCOUNTER — Encounter: Payer: Self-pay | Admitting: Nurse Practitioner

## 2014-10-14 ENCOUNTER — Encounter: Payer: Self-pay | Admitting: Internal Medicine

## 2014-10-14 VITALS — BP 90/62 | HR 66 | Ht 74.0 in | Wt 191.0 lb

## 2014-10-14 DIAGNOSIS — I472 Ventricular tachycardia: Secondary | ICD-10-CM | POA: Diagnosis not present

## 2014-10-14 DIAGNOSIS — I255 Ischemic cardiomyopathy: Secondary | ICD-10-CM

## 2014-10-14 DIAGNOSIS — I480 Paroxysmal atrial fibrillation: Secondary | ICD-10-CM | POA: Diagnosis not present

## 2014-10-14 DIAGNOSIS — I4729 Other ventricular tachycardia: Secondary | ICD-10-CM

## 2014-10-14 DIAGNOSIS — I5022 Chronic systolic (congestive) heart failure: Secondary | ICD-10-CM | POA: Diagnosis not present

## 2014-10-14 NOTE — Patient Instructions (Addendum)
Medication Instructions:   Your physician recommends that you continue on your current medications as directed. Please refer to the Current Medication list given to you today.   Labwork:  CBC BMET AND MAG  (10/30/14)   Testing/Procedures:  YOU ARE HAVING A GENERATOR CHANGE FOR YOUR DEVICE ON 11/06/14   INSTRUCTIONS WILL BE GIVEN TO YOU    Follow-Up:  IN 19 DAYS FOR WOUND CHECK  AFTER GENERATOR CHANGE 11/06/14  IN 66 DAYS WITH DR Caryl Comes FOR [facility] DEFIB CHECK   Any Other Special Instructions Will Be Listed Below (If Applicable).

## 2014-10-15 DIAGNOSIS — D0472 Carcinoma in situ of skin of left lower limb, including hip: Secondary | ICD-10-CM | POA: Diagnosis not present

## 2014-10-15 DIAGNOSIS — L820 Inflamed seborrheic keratosis: Secondary | ICD-10-CM | POA: Diagnosis not present

## 2014-10-22 DIAGNOSIS — E559 Vitamin D deficiency, unspecified: Secondary | ICD-10-CM | POA: Diagnosis not present

## 2014-10-22 DIAGNOSIS — E782 Mixed hyperlipidemia: Secondary | ICD-10-CM | POA: Diagnosis not present

## 2014-10-22 DIAGNOSIS — E11339 Type 2 diabetes mellitus with moderate nonproliferative diabetic retinopathy without macular edema: Secondary | ICD-10-CM | POA: Diagnosis not present

## 2014-10-22 DIAGNOSIS — I480 Paroxysmal atrial fibrillation: Secondary | ICD-10-CM | POA: Diagnosis not present

## 2014-10-22 DIAGNOSIS — E1159 Type 2 diabetes mellitus with other circulatory complications: Secondary | ICD-10-CM | POA: Diagnosis not present

## 2014-10-26 LAB — CUP PACEART INCLINIC DEVICE CHECK
Date Time Interrogation Session: 20160926072728
Pulse Gen Serial Number: 554225

## 2014-10-27 DIAGNOSIS — E1159 Type 2 diabetes mellitus with other circulatory complications: Secondary | ICD-10-CM | POA: Diagnosis not present

## 2014-10-27 DIAGNOSIS — I480 Paroxysmal atrial fibrillation: Secondary | ICD-10-CM | POA: Diagnosis not present

## 2014-10-27 DIAGNOSIS — E559 Vitamin D deficiency, unspecified: Secondary | ICD-10-CM | POA: Diagnosis not present

## 2014-10-27 DIAGNOSIS — I2510 Atherosclerotic heart disease of native coronary artery without angina pectoris: Secondary | ICD-10-CM | POA: Diagnosis not present

## 2014-10-27 DIAGNOSIS — E782 Mixed hyperlipidemia: Secondary | ICD-10-CM | POA: Diagnosis not present

## 2014-10-27 DIAGNOSIS — E11339 Type 2 diabetes mellitus with moderate nonproliferative diabetic retinopathy without macular edema: Secondary | ICD-10-CM | POA: Diagnosis not present

## 2014-10-30 ENCOUNTER — Other Ambulatory Visit (INDEPENDENT_AMBULATORY_CARE_PROVIDER_SITE_OTHER): Payer: Medicare Other | Admitting: *Deleted

## 2014-10-30 DIAGNOSIS — I4729 Other ventricular tachycardia: Secondary | ICD-10-CM

## 2014-10-30 DIAGNOSIS — I472 Ventricular tachycardia: Secondary | ICD-10-CM

## 2014-10-30 LAB — BASIC METABOLIC PANEL
BUN: 17 mg/dL (ref 6–23)
CO2: 30 mEq/L (ref 19–32)
Calcium: 9.1 mg/dL (ref 8.4–10.5)
Chloride: 104 mEq/L (ref 96–112)
Creatinine, Ser: 0.99 mg/dL (ref 0.40–1.50)
GFR: 77.1 mL/min (ref >60.00)
Glucose, Bld: 129 mg/dL — ABNORMAL HIGH (ref 70–99)
Potassium: 4.2 mEq/L (ref 3.5–5.1)
Sodium: 138 mEq/L (ref 135–145)

## 2014-10-30 LAB — CBC
HCT: 41.2 % (ref 39.0–52.0)
Hemoglobin: 13.9 g/dL (ref 13.0–17.0)
MCHC: 33.8 g/dL (ref 30.0–36.0)
MCV: 98.7 fl (ref 78.0–100.0)
Platelets: 133 10*3/uL — ABNORMAL LOW (ref 150.0–400.0)
RBC: 4.17 Mil/uL — ABNORMAL LOW (ref 4.22–5.81)
RDW: 13.7 % (ref 11.5–15.5)
WBC: 3.9 10*3/uL — ABNORMAL LOW (ref 4.0–10.5)

## 2014-10-30 LAB — MAGNESIUM: Magnesium: 2.1 mg/dL (ref 1.5–2.5)

## 2014-11-06 ENCOUNTER — Encounter (HOSPITAL_COMMUNITY): Payer: Self-pay | Admitting: Internal Medicine

## 2014-11-06 ENCOUNTER — Ambulatory Visit (HOSPITAL_COMMUNITY)
Admission: RE | Admit: 2014-11-06 | Discharge: 2014-11-06 | Disposition: A | Discharge status: Home / Self Care | Payer: Medicare Other | Source: Ambulatory Visit | Attending: Internal Medicine | Admitting: Internal Medicine

## 2014-11-06 ENCOUNTER — Encounter (HOSPITAL_COMMUNITY)
Admission: RE | Disposition: A | Discharge status: Home / Self Care | Payer: Self-pay | Source: Ambulatory Visit | Attending: Internal Medicine

## 2014-11-06 DIAGNOSIS — Z952 Presence of prosthetic heart valve: Secondary | ICD-10-CM | POA: Diagnosis not present

## 2014-11-06 DIAGNOSIS — G43909 Migraine, unspecified, not intractable, without status migrainosus: Secondary | ICD-10-CM | POA: Diagnosis not present

## 2014-11-06 DIAGNOSIS — I4892 Unspecified atrial flutter: Secondary | ICD-10-CM | POA: Diagnosis not present

## 2014-11-06 DIAGNOSIS — E119 Type 2 diabetes mellitus without complications: Secondary | ICD-10-CM | POA: Insufficient documentation

## 2014-11-06 DIAGNOSIS — I25118 Atherosclerotic heart disease of native coronary artery with other forms of angina pectoris: Secondary | ICD-10-CM | POA: Diagnosis present

## 2014-11-06 DIAGNOSIS — I255 Ischemic cardiomyopathy: Secondary | ICD-10-CM | POA: Diagnosis not present

## 2014-11-06 DIAGNOSIS — I2510 Atherosclerotic heart disease of native coronary artery without angina pectoris: Secondary | ICD-10-CM | POA: Diagnosis not present

## 2014-11-06 DIAGNOSIS — Z4502 Encounter for adjustment and management of automatic implantable cardiac defibrillator: Secondary | ICD-10-CM | POA: Diagnosis not present

## 2014-11-06 DIAGNOSIS — I472 Ventricular tachycardia: Secondary | ICD-10-CM | POA: Diagnosis not present

## 2014-11-06 DIAGNOSIS — J90 Pleural effusion, not elsewhere classified: Secondary | ICD-10-CM | POA: Diagnosis not present

## 2014-11-06 DIAGNOSIS — I5022 Chronic systolic (congestive) heart failure: Secondary | ICD-10-CM | POA: Diagnosis not present

## 2014-11-06 DIAGNOSIS — Z95810 Presence of automatic (implantable) cardiac defibrillator: Secondary | ICD-10-CM | POA: Diagnosis present

## 2014-11-06 DIAGNOSIS — Z7982 Long term (current) use of aspirin: Secondary | ICD-10-CM | POA: Insufficient documentation

## 2014-11-06 DIAGNOSIS — Z951 Presence of aortocoronary bypass graft: Secondary | ICD-10-CM | POA: Diagnosis not present

## 2014-11-06 DIAGNOSIS — I7300 Raynaud's syndrome without gangrene: Secondary | ICD-10-CM | POA: Diagnosis not present

## 2014-11-06 DIAGNOSIS — I4729 Other ventricular tachycardia: Secondary | ICD-10-CM

## 2014-11-06 DIAGNOSIS — I480 Paroxysmal atrial fibrillation: Secondary | ICD-10-CM | POA: Diagnosis not present

## 2014-11-06 DIAGNOSIS — Z91040 Latex allergy status: Secondary | ICD-10-CM | POA: Diagnosis not present

## 2014-11-06 HISTORY — PX: EP IMPLANTABLE DEVICE: SHX172B

## 2014-11-06 LAB — SURGICAL PCR SCREEN
MRSA, PCR: NEGATIVE
Staphylococcus aureus: NEGATIVE

## 2014-11-06 LAB — GLUCOSE, CAPILLARY: Glucose-Capillary: 118 mg/dL — ABNORMAL HIGH (ref 65–99)

## 2014-11-06 SURGERY — ICD/BIV ICD GENERATOR CHANGEOUT
Anesthesia: LOCAL

## 2014-11-06 MED ORDER — FENTANYL CITRATE (PF) 100 MCG/2ML IJ SOLN
INTRAMUSCULAR | Status: AC
  Filled 2014-11-06: qty 4

## 2014-11-06 MED ORDER — MUPIROCIN 2 % EX OINT
TOPICAL_OINTMENT | CUTANEOUS | Status: AC
  Administered 2014-11-06: 1 via TOPICAL
  Filled 2014-11-06: qty 22

## 2014-11-06 MED ORDER — SODIUM CHLORIDE 0.9 % IR SOLN
80.0000 mg | Status: AC
  Administered 2014-11-06: 80 mg
  Filled 2014-11-06: qty 2

## 2014-11-06 MED ORDER — MIDAZOLAM HCL 5 MG/5ML IJ SOLN
INTRAMUSCULAR | Status: AC
  Filled 2014-11-06: qty 25

## 2014-11-06 MED ORDER — LIDOCAINE HCL (PF) 1 % IJ SOLN
INTRAMUSCULAR | Status: DC | PRN
  Administered 2014-11-06: 25 mL via SUBCUTANEOUS

## 2014-11-06 MED ORDER — MIDAZOLAM HCL 5 MG/5ML IJ SOLN
INTRAMUSCULAR | Status: DC | PRN
  Administered 2014-11-06: 2 mg via INTRAVENOUS
  Administered 2014-11-06: 1 mg via INTRAVENOUS
  Administered 2014-11-06: 2 mg via INTRAVENOUS
  Administered 2014-11-06: 1 mg via INTRAVENOUS

## 2014-11-06 MED ORDER — FENTANYL CITRATE (PF) 100 MCG/2ML IJ SOLN
INTRAMUSCULAR | Status: DC | PRN
  Administered 2014-11-06 (×3): 25 ug via INTRAVENOUS
  Administered 2014-11-06: 50 ug via INTRAVENOUS

## 2014-11-06 MED ORDER — LIDOCAINE HCL (PF) 1 % IJ SOLN
INTRAMUSCULAR | Status: AC
  Filled 2014-11-06: qty 30

## 2014-11-06 MED ORDER — MUPIROCIN 2 % EX OINT
1.0000 "application " | TOPICAL_OINTMENT | Freq: Once | CUTANEOUS | Status: AC
  Administered 2014-11-06: 1 via TOPICAL
  Filled 2014-11-06: qty 22

## 2014-11-06 MED ORDER — SODIUM CHLORIDE 0.9 % IR SOLN
Status: AC
  Filled 2014-11-06: qty 2

## 2014-11-06 MED ORDER — CHLORHEXIDINE GLUCONATE 4 % EX LIQD
60.0000 mL | Freq: Once | CUTANEOUS | Status: DC
  Filled 2014-11-06: qty 60

## 2014-11-06 MED ORDER — CEFAZOLIN SODIUM-DEXTROSE 2-3 GM-% IV SOLR
2.0000 g | INTRAVENOUS | Status: AC
  Administered 2014-11-06: 2 g via INTRAVENOUS

## 2014-11-06 MED ORDER — CEFAZOLIN SODIUM-DEXTROSE 2-3 GM-% IV SOLR
INTRAVENOUS | Status: AC
  Filled 2014-11-06: qty 50

## 2014-11-06 MED ORDER — SODIUM CHLORIDE 0.9 % IV SOLN
INTRAVENOUS | Status: DC
  Administered 2014-11-06: 10:00:00 via INTRAVENOUS

## 2014-11-06 SURGICAL SUPPLY — 4 items
CABLE SURGICAL S-101-97-12 (CABLE) ×2 IMPLANT
ICD VIVA QUAD XT CRT-D DTBA1Q1 (ICD Generator) ×1 IMPLANT
PAD DEFIB LIFELINK (PAD) ×2 IMPLANT
TRAY PACEMAKER INSERTION (CUSTOM PROCEDURE TRAY) ×1 IMPLANT

## 2014-11-06 NOTE — Discharge Instructions (Signed)
Pacemaker Battery Change °A pacemaker battery usually lasts 4 to 12 years. Once or twice per year, you will be asked to visit your health care provider to have a full evaluation of your pacemaker. When a battery needs to be replaced, the entire pacemaker is replaced so that you can benefit from new circuitry and any new features that have been added to pacemakers. Most often, this procedure is very simple because the leads are already in place.  °There are many things that affect how long a pacemaker battery will last, including:  °· The age of the pacemaker.   °· The number of leads (1, 2, or 3).   °· The pacemaker work load. If the pacemaker is helping the heart more often, the battery will not last as long as it would if the pacemaker did not need to help the heart.   °· Power (voltage) settings.  °LET YOUR HEALTH CARE PROVIDER KNOW ABOUT:  °· Any allergies you have.   °· All medicines you are taking, including vitamins, herbs, eye drops, creams, and over-the-counter medicines.   °· Previous problems you or members of your family have had with the use of anesthetics.   °· Any blood disorders you have.   °· Previous surgeries you have had, especially since your last pacemaker placement.   °· Medical conditions you have.   °· Possibility of pregnancy, if this applies. °· Symptoms of chest pain, trouble breathing, palpitations, light-headedness, or feelings of an abnormal or irregular heartbeat. °RISKS AND COMPLICATIONS  °Generally, this is a safe procedure. However, as with any procedure, problems can occur and include:  °· Bleeding.   °· Bruising of the skin around where the incision was made.   °· Pain at the incision site.   °· Pulling apart of the skin at the incision site.   °· Infection.   °· Allergic reaction to anesthetics or other medicines used during the procedure.   °People with diabetes may have a temporary increase in their blood sugar after any surgical procedure.  °BEFORE THE PROCEDURE  °· Wash all  of the skin around the area of the chest where the pacemaker is located.   °· Ask your health care provider for help with any medicine adjustments before the pacemaker is replaced.   °· Do not eat or drink anything after midnight on the night before the procedure or as directed by your health care provider. °· Ask your health care provider if you can take a sip of water with any approved medicines the morning of the procedure. °PROCEDURE  °· After giving medicine to numb the skin (local anesthetic), your health care provider will make a cut to reopen the pocket holding the pacemaker.   °· The old pacemaker will be disconnected from its leads.   °· The leads will be tested.   °· If needed, the leads will be replaced. If the leads are functioning properly, the new pacemaker may be connected to the existing leads. °· A heart monitor and the pacemaker programmer will be used to make sure that the new pacemaker is working properly. °· The incision site will then be closed. A dressing will be placed over the pacemaker site. The dressing will be removed 24-48 hours afterward. °AFTER THE PROCEDURE  °· You will be taken to a recovery area after the new pacemaker implant is completed. Your vital signs such as blood pressure, heart rate, breathing, and oxygen levels will be monitored. °· Your health care provider will tell you when you will need to next test your pacemaker or when to return to the office for follow-up   for removal of stitches. °  °This information is not intended to replace advice given to you by your health care provider. Make sure you discuss any questions you have with your health care provider. °  °Document Released: 04/26/2006 Document Revised: 02/06/2014 Document Reviewed: 07/31/2012 °Elsevier Interactive Patient Education ©2016 [employer] ° °

## 2014-11-07 NOTE — H&P (Signed)
Patient Care Team: Briscoe Deutscher, MD as PCP - General (Family Medicine)   HPI  [name] is a 81 y.o. male admitted for device replacement He has history of VT below his detection zone that was asymptomatic. Since last being seen in our clinic, the patient reports doing very well. He denies chest pain, dyspnea, PND, orthopnea, nausea, vomiting, dizziness, syncope, edema, weight gain, or early satiety. He has not had ICD shocks. He does have some tachycardia with exertion which has been identified as short episodes of atrial flutter. He asked about a device with atrial therapy capability at change out.   Last cath 2014 demonstrated 2V CAD with severe stenosis of proximal LAD and continued patency of LAD, moderate stenosis of the prox circ with heavy calcification and continue patency of free RIMA to obtuse marginal graft; EF 35-40%.     Past Medical History  Diagnosis Date  . Coronary artery disease     [place] [facility] [facility]  . Ventricular tachycardia, polymorphic ([place])     status post ICD implantation  . Raynaud's disease   . ICD (implantable cardiac defibrillator) in place   . Diabetes mellitus   . Endocarditis, valve unspecified     Mitral  . PAF (paroxysmal atrial fibrillation) ([place])   . PVC's (premature ventricular contractions)   . History of seasonal allergies   . History of migraine headaches   . Pleural effusion     Past Surgical History  Procedure Laterality Date  . Mitral valve replacement    . Coronary artery bypass graft  2001    2 VESSEL CABG AND MITRAL VALVE REPAIR. HE HAD A LIMA GRAFT TO THE LAD AND RADIAL ARTERY  GRAFT TO THE OBTUSE MARGINAL  OF LEFT CIRCUMFLEX  . Cardiac defibrillator placement    . Hemorroidectomy    . Knee arthroscopy      RIGHT KNEE  . Cardiac catheterization  04/03/2007    EF 40%  . Cardiac catheterization  02/06/2006    EF 45%. ANTERIOR HYPOKINESIS  . Cardiac catheterization  05/29/2000    EF 50%. MILD  ANTERIOR HYPOKINESIS  . Cardiac catheterization  10/25/1999    EF 50%. SEVERE MITRAL REGURGITATION  . Cardiac catheterization  01/06/2003    EF 50%  . Transthoracic echocardiogram  04/02/2007    EF 35%  . Left heart catheterization with coronary angiogram N/A 07/04/2012    Procedure: LEFT HEART CATHETERIZATION WITH CORONARY ANGIOGRAM;  Surgeon: Sherren Mocha, MD;  Location: [facility] CATH LAB;  Service: Cardiovascular;  Laterality: N/A;  . Ep implantable device N/A 11/06/2014    Procedure: ICD Generator Changeout;  Surgeon: Deboraha Sprang, MD;  Location: [facility] CV LAB;  Service: Cardiovascular;  Laterality: N/A;    No current facility-administered medications for this encounter.   Current Outpatient Prescriptions  Medication Sig Dispense Refill  . aspirin 81 MG tablet Take 81 mg by mouth daily.      Marland Kitchen atorvastatin (LIPITOR) 80 MG tablet Take 1 tablet by mouth Daily.    . Cholecalciferol (VITAMIN D-3) 5000 UNITS TABS Take 5,000-10,000 Units by mouth daily. EVERY OTHER DAY. Take 1 tablet four times a WEEK and then take 2 tablets three times a WEEK.    Marland Kitchen EPIPEN 2-PAK 0.3 MG/0.3ML SOAJ injection USE ONLY IN EMERGENCY    . fluticasone (FLONASE) 50 MCG/ACT nasal spray Place 1 spray into the nose daily as needed for rhinitis or allergies.    . Magnesium 250 MG  TABS Take 250 mg by mouth daily.     . metoprolol succinate (TOPROL-XL) 50 MG 24 hr tablet TAKE 1 TABLET (50 MG TOTAL) BY MOUTH DAILY. 90 tablet 1  . Multiple Vitamin (MULTIVITAMIN WITH MINERALS) TABS Take 1 tablet by mouth daily.    . Multiple Vitamins-Minerals (PRESERVISION AREDS 2) CAPS Take 2 capsules by mouth daily.    . nateglinide (STARLIX) 120 MG tablet Take 1 tablet by mouth Twice daily.    . pioglitazone (ACTOS) 45 MG tablet Take 45 mg by mouth daily.      . sitaGLIPtin (JANUVIA) 100 MG tablet Take 100 mg by mouth daily.      Marland Kitchen [name] 500 MCG capsule Take 500 mg by mouth 2 (two) times daily.  3  . Umeclidinium-Vilanterol (ANORO  ELLIPTA) 62.5-25 MCG/INH AEPB Inhale 1 puff into the lungs daily.       Allergies  Allergen Reactions  . Latex     REDNESS AT REACTION SITE.  Marland Kitchen Other     BEE STINGS   . Sotalol     "BLUE TOES"- cut his circulation off  . Warfarin Sodium     REACTION: migraine headaches/vision impariment     Medication List    TAKE these medications        ANORO ELLIPTA 62.5-25 MCG/INH Aepb  Generic drug:  Umeclidinium-Vilanterol  Inhale 1 puff into the lungs daily.     aspirin 81 MG tablet  Take 81 mg by mouth daily.     atorvastatin 80 MG tablet  Commonly known as:  LIPITOR  Take 1 tablet by mouth Daily.     EPIPEN 2-PAK 0.3 mg/0.3 mL Soaj injection  Generic drug:  EPINEPHrine  USE ONLY IN EMERGENCY     fluticasone 50 MCG/ACT nasal spray  Commonly known as:  FLONASE  Place 1 spray into the nose daily as needed for rhinitis or allergies.     Magnesium 250 MG Tabs  Take 250 mg by mouth daily.     metoprolol succinate 50 MG 24 hr tablet  Commonly known as:  TOPROL-XL  TAKE 1 TABLET (50 MG TOTAL) BY MOUTH DAILY.     multivitamin with minerals Tabs tablet  Take 1 tablet by mouth daily.     nateglinide 120 MG tablet  Commonly known as:  STARLIX  Take 1 tablet by mouth Twice daily.     pioglitazone 45 MG tablet  Commonly known as:  ACTOS  Take 45 mg by mouth daily.     PRESERVISION AREDS 2 Caps  Take 2 capsules by mouth daily.     sitaGLIPtin 100 MG tablet  Commonly known as:  JANUVIA  Take 100 mg by mouth daily.     [name] 500 MCG capsule  Generic drug:  dofetilide  Take 500 mg by mouth 2 (two) times daily.     Vitamin D-3 5000 UNITS Tabs  Take 5,000-10,000 Units by mouth daily. EVERY OTHER DAY. Take 1 tablet four times a WEEK and then take 2 tablets three times a WEEK.          Review of Systems negative except from HPI and PMH  Physical Exam BP 104/55 mmHg  Pulse 40  Temp(Src) 98.1 F (36.7 C) (Oral)  Resp 19  Ht 6\' 2"  (1.88 m)  Wt 205 lb (92.987 kg)   BMI 26.31 kg/m2  SpO2 100% Well developed and well nourished in no acute distress HENT normal E scleral and icterus clear Neck Supple JVP flat; carotids brisk  and full Clear to ausculation  Regular rate and rhythm, no murmurs gallops or rub Soft with active bowel sounds No clubbing cyanosis Trace and none Edema Alert and oriented, grossly normal motor and sensory function Skin Warm and Dry  ECG  P-synchronous/ AV  pacing   Assessment and  Plan  Ischemic cardiomyopathy ejection 35-40%  with prior bypass  Congestive heart failure-chronic-systolic  Reactive airways disease much improved  Implantable defibrillator-[employer] The patient's device was interrogated. The information was reviewed. Device was reprogrammed as below   Atrial fibrillation on dofetilide   His symptoms are minimal at this time so will not pursue CRT upgrade but wil place a generator capable of LV upgrade.  We have also noted taht the AV delay which allows intrinsic conduction is about 325 msec w pseudofusion, so will reprogram to all following generator replacement  We have reviewed the benefits and risks of generator replacement.  These include but are not limited to lead fracture and infection.  The patient understands, agrees and is willing to proceed.

## 2014-11-10 DIAGNOSIS — Z23 Encounter for immunization: Secondary | ICD-10-CM | POA: Diagnosis not present

## 2014-11-11 ENCOUNTER — Ambulatory Visit (INDEPENDENT_AMBULATORY_CARE_PROVIDER_SITE_OTHER): Payer: Medicare Other | Admitting: *Deleted

## 2014-11-11 DIAGNOSIS — I5022 Chronic systolic (congestive) heart failure: Secondary | ICD-10-CM

## 2014-11-11 DIAGNOSIS — I255 Ischemic cardiomyopathy: Secondary | ICD-10-CM

## 2014-11-11 LAB — CUP PACEART INCLINIC DEVICE CHECK
Battery Remaining Longevity: 118 mo
Battery Voltage: 3.1 V
Brady Statistic AP VP Percent: 42.38 %
Brady Statistic AP VS Percent: 36.42 %
Brady Statistic AS VP Percent: 0.81 %
Brady Statistic AS VS Percent: 20.39 %
Brady Statistic RA Percent Paced: 78.8 %
Brady Statistic RV Percent Paced: 41.29 %
Date Time Interrogation Session: 20161012132449
HighPow Impedance: 38 Ohm
HighPow Impedance: 56 Ohm
Implantable Lead Implant Date: 20040309
Implantable Lead Implant Date: 20040309
Implantable Lead Location: 753859
Implantable Lead Location: 753860
Implantable Lead Model: 158
Implantable Lead Model: 5076
Implantable Lead Serial Number: 115061
Lead Channel Impedance Value: 4047 Ohm
Lead Channel Impedance Value: 4047 Ohm
Lead Channel Impedance Value: 4047 Ohm
Lead Channel Impedance Value: 4047 Ohm
Lead Channel Impedance Value: 4047 Ohm
Lead Channel Impedance Value: 4047 Ohm
Lead Channel Impedance Value: 4047 Ohm
Lead Channel Impedance Value: 4047 Ohm
Lead Channel Impedance Value: 4047 Ohm
Lead Channel Impedance Value: 4047 Ohm
Lead Channel Impedance Value: 418 Ohm
Lead Channel Impedance Value: 551 Ohm
Lead Channel Impedance Value: 551 Ohm
Lead Channel Pacing Threshold Amplitude: 0.5 V
Lead Channel Pacing Threshold Amplitude: 0.625 V
Lead Channel Pacing Threshold Pulse Width: 0.4 ms
Lead Channel Pacing Threshold Pulse Width: 0.4 ms
Lead Channel Sensing Intrinsic Amplitude: 2.125 mV
Lead Channel Sensing Intrinsic Amplitude: 2.125 mV
Lead Channel Sensing Intrinsic Amplitude: 7.25 mV
Lead Channel Sensing Intrinsic Amplitude: 7.25 mV
Lead Channel Setting Pacing Amplitude: 1.5 V
Lead Channel Setting Pacing Amplitude: 2 V
Lead Channel Setting Pacing Pulse Width: 0.4 ms
Lead Channel Setting Sensing Sensitivity: 0.3 mV
Zone Setting Detection Interval: 240 ms
Zone Setting Detection Interval: 320 ms
Zone Setting Detection Interval: 400 ms
Zone Setting Detection Interval: 420 ms

## 2014-11-11 NOTE — Progress Notes (Signed)
ICD Criteria  Current LVEF:35%. Within 12 months prior to implant: No   Heart failure history: Yes, Class I  Cardiomyopathy history: Yes, Ischemic Cardiomyopathy.  Atrial Fibrillation/Atrial Flutter: No.  Ventricular tachycardia history: Yes, Hemodynamic instability present. VT Type: Sustained Ventricular Tachycardia - Monomorphic and Polymorphic.  Cardiac arrest history: No.  History of syndromes with risk of sudden death: No.  Previous ICD: Yes, Reason for ICD:  Primary prevention.  Current ICD indication: Secondary  PPM indication: No.   Class I or II Bradycardia indication present: Yes  Beta Blocker therapy for 3 or more months: Yes, prescribed.   Ace Inhibitor/ARB therapy for 3 or more months: No, medical reason.

## 2014-11-11 NOTE — Progress Notes (Signed)
Patient presents to the office to have his audible alert for LV lead impedance D/C'd. Patient does not have an LV lead in place. Alert tone/[facility] alert D/C'd. Patient to follow up for his wound check appt as scheduled.

## 2014-11-19 ENCOUNTER — Encounter: Payer: Self-pay | Admitting: Internal Medicine

## 2014-11-19 ENCOUNTER — Ambulatory Visit (INDEPENDENT_AMBULATORY_CARE_PROVIDER_SITE_OTHER): Payer: Medicare Other | Admitting: *Deleted

## 2014-11-19 DIAGNOSIS — I480 Paroxysmal atrial fibrillation: Secondary | ICD-10-CM

## 2014-11-19 DIAGNOSIS — I4729 Other ventricular tachycardia: Secondary | ICD-10-CM

## 2014-11-19 DIAGNOSIS — I472 Ventricular tachycardia: Secondary | ICD-10-CM

## 2014-11-19 LAB — CUP PACEART INCLINIC DEVICE CHECK
Battery Remaining Longevity: 111 mo
Battery Voltage: 3.12 V
Brady Statistic AP VP Percent: 49.13 %
Brady Statistic AP VS Percent: 33.48 %
Brady Statistic AS VP Percent: 1.8 %
Brady Statistic AS VS Percent: 15.6 %
Brady Statistic RA Percent Paced: 82.61 %
Brady Statistic RV Percent Paced: 47.42 %
Date Time Interrogation Session: 20161020114728
HighPow Impedance: 41 Ohm
HighPow Impedance: 56 Ohm
Implantable Lead Implant Date: 20040309
Implantable Lead Implant Date: 20040309
Implantable Lead Location: 753859
Implantable Lead Location: 753860
Implantable Lead Model: 158
Implantable Lead Model: 5076
Implantable Lead Serial Number: 115061
Lead Channel Impedance Value: 4047 Ohm
Lead Channel Impedance Value: 4047 Ohm
Lead Channel Impedance Value: 4047 Ohm
Lead Channel Impedance Value: 4047 Ohm
Lead Channel Impedance Value: 4047 Ohm
Lead Channel Impedance Value: 4047 Ohm
Lead Channel Impedance Value: 4047 Ohm
Lead Channel Impedance Value: 4047 Ohm
Lead Channel Impedance Value: 4047 Ohm
Lead Channel Impedance Value: 4047 Ohm
Lead Channel Impedance Value: 418 Ohm
Lead Channel Impedance Value: 551 Ohm
Lead Channel Impedance Value: 551 Ohm
Lead Channel Pacing Threshold Amplitude: 0.375 V
Lead Channel Pacing Threshold Amplitude: 0.625 V
Lead Channel Pacing Threshold Pulse Width: 0.4 ms
Lead Channel Pacing Threshold Pulse Width: 0.4 ms
Lead Channel Sensing Intrinsic Amplitude: 1.75 mV
Lead Channel Sensing Intrinsic Amplitude: 2.5 mV
Lead Channel Sensing Intrinsic Amplitude: 7.125 mV
Lead Channel Sensing Intrinsic Amplitude: 8.75 mV
Lead Channel Setting Pacing Amplitude: 2 V
Lead Channel Setting Pacing Amplitude: 2.5 V
Lead Channel Setting Pacing Pulse Width: 0.4 ms
Lead Channel Setting Sensing Sensitivity: 0.3 mV

## 2014-11-19 NOTE — Progress Notes (Signed)
Wound check appointment. Wound without redness or edema. Incision edges approximated, wound well healed. Normal device function. Thresholds, sensing, and impedances consistent with implant measurements. Device programmed at 3.5V for extra safety margin until 3 month visit. Histogram distribution appropriate for patient and level of activity. (6) total AT/AF episodes(all on 10/17)2.0% burden---(5) treated w/ATP---0% success---max dur. 1hr 35 mins, Max Avg A 243, Max Avg V 110 + ASA/[name]. (1) ventricular arrhythmia noted x 6 bts @ 98/176. Patient educated about wound care, arm mobility, lifting restrictions, shock plan. ROV w/SK on 12/16 @ 1130.

## 2014-12-01 DIAGNOSIS — H35363 Drusen (degenerative) of macula, bilateral: Secondary | ICD-10-CM | POA: Diagnosis not present

## 2014-12-01 DIAGNOSIS — E113393 Type 2 diabetes mellitus with moderate nonproliferative diabetic retinopathy without macular edema, bilateral: Secondary | ICD-10-CM | POA: Diagnosis not present

## 2014-12-01 DIAGNOSIS — H35043 Retinal micro-aneurysms, unspecified, bilateral: Secondary | ICD-10-CM | POA: Diagnosis not present

## 2014-12-01 DIAGNOSIS — H35373 Puckering of macula, bilateral: Secondary | ICD-10-CM | POA: Diagnosis not present

## 2014-12-01 DIAGNOSIS — H43813 Vitreous degeneration, bilateral: Secondary | ICD-10-CM | POA: Diagnosis not present

## 2014-12-15 DIAGNOSIS — J449 Chronic obstructive pulmonary disease, unspecified: Secondary | ICD-10-CM | POA: Diagnosis not present

## 2015-01-01 ENCOUNTER — Encounter: Payer: Self-pay | Admitting: Internal Medicine

## 2015-01-15 ENCOUNTER — Encounter: Payer: Self-pay | Admitting: Internal Medicine

## 2015-01-15 ENCOUNTER — Ambulatory Visit (INDEPENDENT_AMBULATORY_CARE_PROVIDER_SITE_OTHER): Payer: Medicare Other | Admitting: Internal Medicine

## 2015-01-15 VITALS — BP 123/78 | HR 83 | Ht 74.0 in | Wt 221.0 lb

## 2015-01-15 DIAGNOSIS — Z4502 Encounter for adjustment and management of automatic implantable cardiac defibrillator: Secondary | ICD-10-CM

## 2015-01-15 DIAGNOSIS — Z95810 Presence of automatic (implantable) cardiac defibrillator: Secondary | ICD-10-CM

## 2015-01-15 DIAGNOSIS — I5022 Chronic systolic (congestive) heart failure: Secondary | ICD-10-CM | POA: Diagnosis not present

## 2015-01-15 DIAGNOSIS — I480 Paroxysmal atrial fibrillation: Secondary | ICD-10-CM

## 2015-01-15 DIAGNOSIS — I255 Ischemic cardiomyopathy: Secondary | ICD-10-CM

## 2015-01-15 DIAGNOSIS — I4729 Other ventricular tachycardia: Secondary | ICD-10-CM

## 2015-01-15 DIAGNOSIS — I472 Ventricular tachycardia: Secondary | ICD-10-CM | POA: Diagnosis not present

## 2015-01-15 NOTE — Progress Notes (Signed)
Patient Care Team: Briscoe Deutscher, MD as PCP - General (Family Medicine)   HPI  [name] is a 81 y.o. male  Seen in followup for polymorphic ventricular tachycardia for which he has an ICD and atrial fibrillation.  He was seen November/13 because of 2 episodes of polymorphic ventricular tachycardia occurring in the preceding few weeks after having been quiet for months and months. There is no clear inciting event. Beta blocker therapy was gently uptitrated   He underwent ICD generator replacement 9/16   He also has a history of coronary disease with prior CABG mitral valve repair both of which were done at [facility] clinic at that time. he also had a PFO closure.  He underwent catheterization 6/14 demonstrating patency of the LIMA to the LAD and RIMA to his obtuse marginal. Ejection fraction is 35-40%  He Is on dofetilide for his arrhythmias when he was seen last year complaints of increasing exercise intolerance and chest discomfort.          This had one intercurrent episode of near syncope associated with nonsustained ventricular tachycardia.   Past Medical History  Diagnosis Date  . Coronary artery disease     [place] [facility] [facility]  . Ventricular tachycardia, polymorphic ([place])     status post ICD implantation  . Raynaud's disease   . ICD (implantable cardiac defibrillator) in place   . Diabetes mellitus   . Endocarditis, valve unspecified     Mitral  . PAF (paroxysmal atrial fibrillation) ([place])   . PVC's (premature ventricular contractions)   . History of seasonal allergies   . History of migraine headaches   . Pleural effusion     Past Surgical History  Procedure Laterality Date  . Mitral valve replacement    . Coronary artery bypass graft  2001    2 VESSEL CABG AND MITRAL VALVE REPAIR. HE HAD A LIMA GRAFT TO THE LAD AND RADIAL ARTERY  GRAFT TO THE OBTUSE MARGINAL  OF LEFT CIRCUMFLEX  . Cardiac defibrillator placement    . Hemorroidectomy    .  Knee arthroscopy      RIGHT KNEE  . Cardiac catheterization  04/03/2007    EF 40%  . Cardiac catheterization  02/06/2006    EF 45%. ANTERIOR HYPOKINESIS  . Cardiac catheterization  05/29/2000    EF 50%. MILD ANTERIOR HYPOKINESIS  . Cardiac catheterization  10/25/1999    EF 50%. SEVERE MITRAL REGURGITATION  . Cardiac catheterization  01/06/2003    EF 50%  . Transthoracic echocardiogram  04/02/2007    EF 35%  . Left heart catheterization with coronary angiogram N/A 07/04/2012    Procedure: LEFT HEART CATHETERIZATION WITH CORONARY ANGIOGRAM;  Surgeon: Sherren Mocha, MD;  Location: [facility] CATH LAB;  Service: Cardiovascular;  Laterality: N/A;  . Ep implantable device N/A 11/06/2014    Procedure: ICD Generator Changeout;  Surgeon: Deboraha Sprang, MD;  Location: [place] CV LAB;  Service: Cardiovascular;  Laterality: N/A;    Current Outpatient Prescriptions  Medication Sig Dispense Refill  . aspirin 81 MG tablet Take 81 mg by mouth daily.      Marland Kitchen atorvastatin (LIPITOR) 80 MG tablet Take 1 tablet by mouth Daily.    . Cholecalciferol (VITAMIN D-3) 5000 UNITS TABS Take 5,000-10,000 Units by mouth daily. EVERY OTHER DAY. Take 1 tablet four times a WEEK and then take 2 tablets three times a WEEK.    Marland Kitchen EPIPEN 2-PAK 0.3 MG/0.3ML SOAJ injection USE ONLY IN EMERGENCY    .  fluticasone (FLONASE) 50 MCG/ACT nasal spray Place 1 spray into the nose daily as needed for rhinitis or allergies.    . Magnesium 250 MG TABS Take 250 mg by mouth daily.     . metoprolol succinate (TOPROL-XL) 50 MG 24 hr tablet TAKE 1 TABLET (50 MG TOTAL) BY MOUTH DAILY. 90 tablet 1  . Multiple Vitamin (MULTIVITAMIN WITH MINERALS) TABS Take 1 tablet by mouth daily.    . Multiple Vitamins-Minerals (PRESERVISION AREDS 2) CAPS Take 2 capsules by mouth daily.    . nateglinide (STARLIX) 120 MG tablet Take 1 tablet by mouth Twice daily.    . pioglitazone (ACTOS) 45 MG tablet Take 45 mg by mouth daily.      . sitaGLIPtin (JANUVIA) 100 MG tablet Take  100 mg by mouth daily.      Marland Kitchen [name] 500 MCG capsule Take 500 mg by mouth 2 (two) times daily.  3  . Umeclidinium-Vilanterol (ANORO ELLIPTA) 62.5-25 MCG/INH AEPB Inhale 1 puff into the lungs daily.      No current facility-administered medications for this visit.    Allergies  Allergen Reactions  . Latex     REDNESS AT REACTION SITE.  Marland Kitchen Other     BEE STINGS   . Sotalol     "BLUE TOES"- cut his circulation off  . Warfarin Sodium     REACTION: migraine headaches/vision impariment    Review of Systems negative except from HPI and PMH  Physical Exam Ht 6\' 2"  (1.88 m)  Wt 221 lb (100.245 kg)  BMI 28.36 kg/m2 Well developed and well nourished in no acute distress HENT normal E scleral and icterus clear Neck Supple JVP flat; carotids brisk and full Clear to ausculation  Regular rate and rhythm, no murmurs gallops or rub Soft with active bowel sounds No clubbing cyanosis pitting, non-pitting  Edema Alert and oriented, grossly normal motor and sensory function Skin Warm and Dry    Assessment and  Plan  Ischemic heart disease with prior bypass  Ischemic cardiomyopathy ejection 35-40%  Congestive heart failure-chronic-systolic  Reactive airways disease much improved  Implantable defibrillator-[employer] The patient's device was interrogated.  The information was reviewed.  Device was reprogrammed as below  Presyncope  Atrial fibrillation on dofetilide   Ventricular tachycardia nonsustained      .

## 2015-01-15 NOTE — Patient Instructions (Signed)
Medication Instructions: - no changes  Labwork: - none  Procedures/Testing: - none  Follow-Up: - Remote monitoring is used to monitor your Pacemaker of ICD from home. This monitoring reduces the number of office visits required to check your device to one time per year. It allows [name] to keep an eye on the functioning of your device to ensure it is working properly. You are scheduled for a device check from home on 04/19/15. You may send your transmission at any time that day. If you have a wireless device, the transmission will be sent automatically. After your physician reviews your transmission, you will receive a postcard with your next transmission date.  - Your physician wants you to follow-up in: 6 months with Dr. Caryl Comes. You will receive a reminder letter in the mail two months in advance. If you don't receive a letter, please call our office to schedule the follow-up appointment.  Any Additional Special Instructions Will Be Listed Below (If Applicable). - none

## 2015-01-19 ENCOUNTER — Other Ambulatory Visit: Payer: Self-pay | Admitting: Internal Medicine

## 2015-01-19 LAB — CUP PACEART INCLINIC DEVICE CHECK
Battery Remaining Longevity: 107 mo
Battery Voltage: 3.09 V
Brady Statistic AP VP Percent: 50.62 %
Brady Statistic AP VS Percent: 35.02 %
Brady Statistic AS VP Percent: 2.03 %
Brady Statistic AS VS Percent: 12.33 %
Brady Statistic RA Percent Paced: 85.64 %
Brady Statistic RV Percent Paced: 48.15 %
Date Time Interrogation Session: 20161216174551
HighPow Impedance: 41 Ohm
HighPow Impedance: 55 Ohm
Implantable Lead Implant Date: 20040309
Implantable Lead Implant Date: 20040309
Implantable Lead Location: 753859
Implantable Lead Location: 753860
Implantable Lead Model: 158
Implantable Lead Model: 5076
Implantable Lead Serial Number: 115061
Lead Channel Impedance Value: 399 Ohm
Lead Channel Impedance Value: 4047 Ohm
Lead Channel Impedance Value: 4047 Ohm
Lead Channel Impedance Value: 4047 Ohm
Lead Channel Impedance Value: 4047 Ohm
Lead Channel Impedance Value: 4047 Ohm
Lead Channel Impedance Value: 4047 Ohm
Lead Channel Impedance Value: 4047 Ohm
Lead Channel Impedance Value: 4047 Ohm
Lead Channel Impedance Value: 4047 Ohm
Lead Channel Impedance Value: 4047 Ohm
Lead Channel Impedance Value: 475 Ohm
Lead Channel Impedance Value: 513 Ohm
Lead Channel Pacing Threshold Amplitude: 0.5 V
Lead Channel Pacing Threshold Amplitude: 0.75 V
Lead Channel Pacing Threshold Pulse Width: 0.4 ms
Lead Channel Pacing Threshold Pulse Width: 0.4 ms
Lead Channel Sensing Intrinsic Amplitude: 1.875 mV
Lead Channel Sensing Intrinsic Amplitude: 9 mV
Lead Channel Setting Pacing Amplitude: 2 V
Lead Channel Setting Pacing Amplitude: 2.5 V
Lead Channel Setting Pacing Pulse Width: 0.4 ms
Lead Channel Setting Sensing Sensitivity: 0.3 mV

## 2015-01-21 DIAGNOSIS — J0100 Acute maxillary sinusitis, unspecified: Secondary | ICD-10-CM | POA: Diagnosis not present

## 2015-02-12 DIAGNOSIS — R51 Headache: Secondary | ICD-10-CM | POA: Diagnosis not present

## 2015-02-12 DIAGNOSIS — Z1389 Encounter for screening for other disorder: Secondary | ICD-10-CM | POA: Diagnosis not present

## 2015-02-15 ENCOUNTER — Other Ambulatory Visit: Payer: Self-pay | Admitting: *Deleted

## 2015-02-15 MED ORDER — TIKOSYN 500 MCG PO CAPS: 500000.0000 ug | ORAL_CAPSULE | Freq: Two times a day (BID) | ORAL | Status: DC

## 2015-02-15 MED ORDER — METOPROLOL SUCCINATE ER 50 MG PO TB24: 50.0000 mg | ORAL_TABLET | Freq: Every day | ORAL | Status: DC

## 2015-02-16 ENCOUNTER — Other Ambulatory Visit: Payer: Self-pay | Admitting: *Deleted

## 2015-02-16 MED ORDER — METOPROLOL SUCCINATE ER 50 MG PO TB24: 50.0000 mg | ORAL_TABLET | Freq: Every day | ORAL | Status: DC

## 2015-02-16 MED ORDER — TIKOSYN 500 MCG PO CAPS: 500000.0000 ug | ORAL_CAPSULE | Freq: Two times a day (BID) | ORAL | Status: DC

## 2015-03-09 ENCOUNTER — Telehealth: Payer: Self-pay | Admitting: Cardiology

## 2015-03-09 DIAGNOSIS — I472 Ventricular tachycardia: Secondary | ICD-10-CM

## 2015-03-09 DIAGNOSIS — I4729 Other ventricular tachycardia: Secondary | ICD-10-CM

## 2015-03-09 DIAGNOSIS — I480 Paroxysmal atrial fibrillation: Secondary | ICD-10-CM

## 2015-03-09 NOTE — Telephone Encounter (Signed)
Pt called and stated that when he was here for his OV w/ SK that you interrogated his device and was suppose to follow up w/ [employer] about something and he wanted to know what you found out from [place].

## 2015-03-10 NOTE — Telephone Encounter (Signed)
[employer] rep is working with tech services to troubleshoot atrial therapies based on device interrogation information.  Called patient to make him aware.  Advised that I will call him back to update him about the tech service rep's interpretation of the episodes when they contact me with results.  He is agreeable to this plan and denies additional questions at this time.

## 2015-03-12 ENCOUNTER — Telehealth: Payer: Self-pay

## 2015-03-12 NOTE — Telephone Encounter (Signed)
Per tech services, patient's atrial therapies are functioning appropriately and the device is allowing As events to fall into the refractory period (causing Ap events) due to AV delays set at 37ms.  Each Ap event resets the atrial arrhythmia timer and causes extended detection periods, delaying atrial therapies.  Delays set at 351ms to minimize V-pacing, but V-pacing has increased by 10% since last check in 12/2014.  Called patient to make him aware that Dr. Caryl Comes reviewed tech service rep's interpretation of events and recommended that he have an echocardiogram due to increased symptoms and an increase in V-pacing.  Patient's AF burden and episode duration have not increased, despite him having worsening "A-fib symptoms".  Patient is agreeable to this plan.  Order for echo placed.  Patient is aware that he will be contacted to schedule an echo by our office.  He denies additional questions or concerns at this time and is agreeable to this plan.

## 2015-03-12 NOTE — Telephone Encounter (Signed)
[name] 541mcg arrived from [facility]. Patient informed and will pick them up next week.

## 2015-03-16 ENCOUNTER — Telehealth: Payer: Self-pay | Admitting: Internal Medicine

## 2015-03-16 NOTE — Telephone Encounter (Signed)
Needs clarifications on his [name] directions.

## 2015-03-17 MED ORDER — TIKOSYN 500 MCG PO CAPS: 500.0000 ug | ORAL_CAPSULE | Freq: Two times a day (BID) | ORAL | Status: DC

## 2015-03-17 NOTE — Telephone Encounter (Signed)
Spoke with April with [facility].  Clarified directions for [name] and updated pt's chart.

## 2015-03-25 ENCOUNTER — Ambulatory Visit (HOSPITAL_COMMUNITY): Payer: Medicare Other | Attending: Internal Medicine

## 2015-03-25 ENCOUNTER — Other Ambulatory Visit: Payer: Self-pay

## 2015-03-25 DIAGNOSIS — I472 Ventricular tachycardia: Secondary | ICD-10-CM | POA: Diagnosis not present

## 2015-03-25 DIAGNOSIS — I517 Cardiomegaly: Secondary | ICD-10-CM | POA: Insufficient documentation

## 2015-03-25 DIAGNOSIS — I4729 Other ventricular tachycardia: Secondary | ICD-10-CM

## 2015-03-25 DIAGNOSIS — I059 Rheumatic mitral valve disease, unspecified: Secondary | ICD-10-CM | POA: Insufficient documentation

## 2015-03-25 DIAGNOSIS — I340 Nonrheumatic mitral (valve) insufficiency: Secondary | ICD-10-CM | POA: Insufficient documentation

## 2015-03-25 DIAGNOSIS — I480 Paroxysmal atrial fibrillation: Secondary | ICD-10-CM | POA: Diagnosis not present

## 2015-03-25 DIAGNOSIS — I4891 Unspecified atrial fibrillation: Secondary | ICD-10-CM | POA: Diagnosis present

## 2015-04-07 ENCOUNTER — Telehealth: Payer: Self-pay | Admitting: Internal Medicine

## 2015-04-07 NOTE — Telephone Encounter (Signed)
Spoke with pt regarding symptoms which are better and so willnot reprogram device;; His Vp percentage is >50% so we mght   Lose little by decreasing AV interval and promote a better hemodynamic response  His LA enlargement is concerning with his size related to the prognosis of recurrent atrial arrhythmia.  His blood pressure precludes a/ARB or ACE inhibitor  We will see him as previously scheduled.  In the event that he has more atrial arrhythmia symptoms, we will reprogram his AV interval. We also reviewed atrial pacing therapies which have been about 23% effective

## 2015-04-13 ENCOUNTER — Encounter: Payer: Self-pay | Admitting: Internal Medicine

## 2015-04-13 NOTE — Progress Notes (Signed)
Spoke with the patient. He is feeling better. I explained to him the issues of prolonged AV conduction and the implications of P waves being caught in blinking. We will see him as scheduled

## 2015-04-19 ENCOUNTER — Encounter: Payer: Medicare Other | Admitting: *Deleted

## 2015-04-23 ENCOUNTER — Encounter: Payer: Self-pay | Admitting: Cardiology

## 2015-04-27 DIAGNOSIS — E1159 Type 2 diabetes mellitus with other circulatory complications: Secondary | ICD-10-CM | POA: Diagnosis not present

## 2015-04-27 DIAGNOSIS — I480 Paroxysmal atrial fibrillation: Secondary | ICD-10-CM | POA: Diagnosis not present

## 2015-04-27 DIAGNOSIS — E782 Mixed hyperlipidemia: Secondary | ICD-10-CM | POA: Diagnosis not present

## 2015-04-29 DIAGNOSIS — I480 Paroxysmal atrial fibrillation: Secondary | ICD-10-CM | POA: Diagnosis not present

## 2015-04-29 DIAGNOSIS — I2510 Atherosclerotic heart disease of native coronary artery without angina pectoris: Secondary | ICD-10-CM | POA: Diagnosis not present

## 2015-04-29 DIAGNOSIS — E559 Vitamin D deficiency, unspecified: Secondary | ICD-10-CM | POA: Diagnosis not present

## 2015-04-29 DIAGNOSIS — E1159 Type 2 diabetes mellitus with other circulatory complications: Secondary | ICD-10-CM | POA: Diagnosis not present

## 2015-04-29 DIAGNOSIS — E782 Mixed hyperlipidemia: Secondary | ICD-10-CM | POA: Diagnosis not present

## 2015-04-29 DIAGNOSIS — E113393 Type 2 diabetes mellitus with moderate nonproliferative diabetic retinopathy without macular edema, bilateral: Secondary | ICD-10-CM | POA: Diagnosis not present

## 2015-04-30 ENCOUNTER — Ambulatory Visit (INDEPENDENT_AMBULATORY_CARE_PROVIDER_SITE_OTHER): Payer: Medicare Other | Admitting: *Deleted

## 2015-04-30 DIAGNOSIS — I472 Ventricular tachycardia: Secondary | ICD-10-CM | POA: Diagnosis not present

## 2015-04-30 DIAGNOSIS — I4729 Other ventricular tachycardia: Secondary | ICD-10-CM

## 2015-05-04 NOTE — Progress Notes (Signed)
Remote ICD transmission.   

## 2015-05-05 DIAGNOSIS — L821 Other seborrheic keratosis: Secondary | ICD-10-CM | POA: Diagnosis not present

## 2015-05-05 DIAGNOSIS — L570 Actinic keratosis: Secondary | ICD-10-CM | POA: Diagnosis not present

## 2015-05-05 DIAGNOSIS — D485 Neoplasm of uncertain behavior of skin: Secondary | ICD-10-CM | POA: Diagnosis not present

## 2015-05-05 DIAGNOSIS — D1801 Hemangioma of skin and subcutaneous tissue: Secondary | ICD-10-CM | POA: Diagnosis not present

## 2015-05-05 DIAGNOSIS — D225 Melanocytic nevi of trunk: Secondary | ICD-10-CM | POA: Diagnosis not present

## 2015-05-28 LAB — CUP PACEART REMOTE DEVICE CHECK
Battery Remaining Longevity: 106 mo
Battery Voltage: 3.04 V
Brady Statistic AP VP Percent: 48.92 %
Brady Statistic AP VS Percent: 36.49 %
Brady Statistic AS VP Percent: 2.06 %
Brady Statistic AS VS Percent: 12.52 %
Brady Statistic RA Percent Paced: 85.42 %
Brady Statistic RV Percent Paced: 46.06 %
Date Time Interrogation Session: 20170331190326
HighPow Impedance: 43 Ohm
HighPow Impedance: 60 Ohm
Implantable Lead Implant Date: 20040309
Implantable Lead Implant Date: 20040309
Implantable Lead Location: 753859
Implantable Lead Location: 753860
Implantable Lead Model: 158
Implantable Lead Model: 5076
Implantable Lead Serial Number: 115061
Lead Channel Impedance Value: 4047 Ohm
Lead Channel Impedance Value: 4047 Ohm
Lead Channel Impedance Value: 4047 Ohm
Lead Channel Impedance Value: 4047 Ohm
Lead Channel Impedance Value: 4047 Ohm
Lead Channel Impedance Value: 4047 Ohm
Lead Channel Impedance Value: 4047 Ohm
Lead Channel Impedance Value: 4047 Ohm
Lead Channel Impedance Value: 4047 Ohm
Lead Channel Impedance Value: 4047 Ohm
Lead Channel Impedance Value: 456 Ohm
Lead Channel Impedance Value: 532 Ohm
Lead Channel Impedance Value: 532 Ohm
Lead Channel Pacing Threshold Amplitude: 0.5 V
Lead Channel Pacing Threshold Amplitude: 0.75 V
Lead Channel Pacing Threshold Pulse Width: 0.4 ms
Lead Channel Pacing Threshold Pulse Width: 0.4 ms
Lead Channel Sensing Intrinsic Amplitude: 2 mV
Lead Channel Sensing Intrinsic Amplitude: 2 mV
Lead Channel Sensing Intrinsic Amplitude: 7.25 mV
Lead Channel Sensing Intrinsic Amplitude: 7.25 mV
Lead Channel Setting Pacing Amplitude: 2 V
Lead Channel Setting Pacing Amplitude: 2.5 V
Lead Channel Setting Pacing Pulse Width: 0.4 ms
Lead Channel Setting Sensing Sensitivity: 0.3 mV

## 2015-06-01 ENCOUNTER — Encounter: Payer: Self-pay | Admitting: Cardiology

## 2015-06-09 DIAGNOSIS — L820 Inflamed seborrheic keratosis: Secondary | ICD-10-CM | POA: Diagnosis not present

## 2015-06-14 ENCOUNTER — Telehealth: Payer: Self-pay | Admitting: Internal Medicine

## 2015-06-14 NOTE — Telephone Encounter (Signed)
[name] re-ordered through [facility]. Order [id number] should arrive in 7-10 days.

## 2015-06-14 NOTE — Telephone Encounter (Signed)
New message       *STAT* If patient is at the pharmacy, call can be transferred to refill team.   1. Which medications need to be refilled? (please list name of each medication and dose if known) tikosyn .5mg   2. Which pharmacy/location (including street and city if local pharmacy) is medication to be sent to? [phone] and pt ID [id number] 3. Do they need a 30 day or 90 day supply? 90 day Pt is in a special program that you have to call and give ID number

## 2015-06-21 ENCOUNTER — Telehealth: Payer: Self-pay

## 2015-06-21 NOTE — Telephone Encounter (Signed)
Patient notified that [name] 568mcg 3 bottles of 60 each had arrived from [facility]. He stated he would pick this up Tuesday.

## 2015-07-21 ENCOUNTER — Telehealth: Payer: Self-pay | Admitting: Internal Medicine

## 2015-07-21 ENCOUNTER — Ambulatory Visit (INDEPENDENT_AMBULATORY_CARE_PROVIDER_SITE_OTHER): Payer: Medicare Other | Admitting: Internal Medicine

## 2015-07-21 ENCOUNTER — Encounter: Payer: Self-pay | Admitting: Internal Medicine

## 2015-07-21 ENCOUNTER — Telehealth: Payer: Self-pay

## 2015-07-21 VITALS — BP 114/74 | HR 77 | Ht 74.0 in | Wt 215.8 lb

## 2015-07-21 DIAGNOSIS — I480 Paroxysmal atrial fibrillation: Secondary | ICD-10-CM | POA: Diagnosis not present

## 2015-07-21 DIAGNOSIS — I472 Ventricular tachycardia: Secondary | ICD-10-CM | POA: Diagnosis not present

## 2015-07-21 DIAGNOSIS — Z95810 Presence of automatic (implantable) cardiac defibrillator: Secondary | ICD-10-CM

## 2015-07-21 DIAGNOSIS — I5022 Chronic systolic (congestive) heart failure: Secondary | ICD-10-CM | POA: Diagnosis not present

## 2015-07-21 DIAGNOSIS — I255 Ischemic cardiomyopathy: Secondary | ICD-10-CM | POA: Diagnosis not present

## 2015-07-21 DIAGNOSIS — I4729 Other ventricular tachycardia: Secondary | ICD-10-CM

## 2015-07-21 MED ORDER — RIVAROXABAN 20 MG PO TABS: 20.0000 mg | ORAL_TABLET | Freq: Every day | ORAL | Status: DC

## 2015-07-21 NOTE — Telephone Encounter (Signed)
OK per Dr. Caryl Comes to start Xarelto 20 mg once daily at supper time. Will check a CBC/ BMP in 4 weeks. The patient is aware- he will come on 7/26 for this.  Will forward to [employer] for [place].

## 2015-07-21 NOTE — Progress Notes (Signed)
Patient Care Team: [name], MD as PCP - General (Family Medicine)   HPI  [name] is a 82 y.o. male  Seen in followup for polymorphic ventricular tachycardia for which he has an ICD and atrial fibrillation.  He was seen November/13 because of 2 episodes of polymorphic ventricular tachycardia occurring in the preceding few weeks after having been quiet for months and months. There was no clear inciting event. Beta blocker therapy was gently uptitrated.  He underwent ICD generator replacement 9/16   He also has a history of coronary disease with prior CABG mitral valve repair both of which were done at [facility] clinic at that time. he also had a PFO closure.  He underwent catheterization 6/14 demonstrating patency of the LIMA to the LAD and RIMA to his obtuse marginal. Ejection fraction is 35-40%  He Is on dofetilide for his arrhythmias when he was seen last year complaints of increasing exercise intolerance and chest discomfort.  9/16 creatinine 0.99 K4.2  mag 2.1      He is doing well at this point without chest pain or shortness of breath. He's had no edema. He has no bleeding issues on aspirin  At his last visit we discussed anticoagulants. In the past cost and reversibility has been an issue. He has not tolerated warfarin because of migraine headaches.  This had one intercurrent episode of near syncope associated with nonsustained ventricular tachycardia.   Past Medical History  Diagnosis Date  . [place]     [place]  . Ventricular tachycardia, polymorphic ([place])     status post ICD implantation  . Raynaud's disease   . ICD (implantable cardiac defibrillator) in place   . Diabetes mellitus   . Endocarditis, valve unspecified     Mitral  . PAF (paroxysmal atrial fibrillation) ([place])   . PVC's (premature ventricular contractions)   . History of seasonal allergies   . History of migraine headaches   . Pleural effusion      Past Surgical History  Procedure Laterality Date  . Mitral valve replacement    . Coronary artery bypass graft  2001    2 VESSEL CABG AND MITRAL VALVE REPAIR. HE HAD A LIMA GRAFT TO THE LAD AND RADIAL ARTERY  GRAFT TO THE OBTUSE MARGINAL  OF LEFT CIRCUMFLEX  . Cardiac defibrillator placement    . Hemorroidectomy    . Knee arthroscopy      RIGHT KNEE  . Cardiac catheterization  04/03/2007    EF 40%  . Cardiac catheterization  02/06/2006    EF 45%. ANTERIOR HYPOKINESIS  . Cardiac catheterization  05/29/2000    EF 50%. MILD ANTERIOR HYPOKINESIS  . Cardiac catheterization  10/25/1999    EF 50%. SEVERE MITRAL REGURGITATION  . Cardiac catheterization  01/06/2003    EF 50%  . Transthoracic echocardiogram  04/02/2007    EF 35%  . Left heart catheterization with coronary angiogram N/A 07/04/2012    Procedure: LEFT HEART CATHETERIZATION WITH CORONARY ANGIOGRAM;  Surgeon: Sherren Mocha, MD;  Location: [facility] CATH LAB;  Service: Cardiovascular;  Laterality: N/A;  . Ep implantable device N/A 11/06/2014    Procedure: ICD Generator Changeout;  Surgeon: Deboraha Sprang, MD;  Location: [place] CV LAB;  Service: Cardiovascular;  Laterality: N/A;    Current Outpatient Prescriptions  Medication Sig Dispense Refill  . aspirin 81 MG tablet Take 81 mg by mouth daily.      Marland Kitchen atorvastatin (LIPITOR) 80 MG tablet Take  1 tablet by mouth Daily.    . Cholecalciferol (VITAMIN D-3) 5000 UNITS TABS Take 5,000-10,000 Units by mouth daily. EVERY OTHER DAY. Take 1 tablet four times a WEEK and then take 2 tablets three times a WEEK.    Marland Kitchen EPIPEN 2-PAK 0.3 MG/0.3ML SOAJ injection USE ONLY IN EMERGENCY    . fluticasone (FLONASE) 50 MCG/ACT nasal spray Place 1 spray into the nose daily as needed for rhinitis or allergies.    . Magnesium 250 MG TABS Take 250 mg by mouth daily.     . metoprolol succinate (TOPROL-XL) 50 MG 24 hr tablet Take 1 tablet (50 mg total) by mouth daily. Take with or immediately following a meal. 90  tablet 3  . Multiple Vitamin (MULTIVITAMIN WITH MINERALS) TABS Take 1 tablet by mouth daily.    . Multiple Vitamins-Minerals (PRESERVISION AREDS 2) CAPS Take 2 capsules by mouth daily.    . nateglinide (STARLIX) 120 MG tablet Take 1 tablet by mouth Twice daily.    . pioglitazone (ACTOS) 45 MG tablet Take 45 mg by mouth daily.      . sitaGLIPtin (JANUVIA) 100 MG tablet Take 100 mg by mouth daily.      Marland Kitchen [name] 500 MCG capsule Take 1 capsule (500 mcg total) by mouth 2 (two) times daily. 180 capsule 3   No current facility-administered medications for this visit.    Allergies  Allergen Reactions  . Latex     REDNESS AT REACTION SITE.  Marland Kitchen Other     BEE STINGS   . Sotalol     "BLUE TOES"- cut his circulation off  . Warfarin Sodium     REACTION: migraine headaches/vision impariment    Review of Systems negative except from HPI and PMH  Physical Exam BP 114/74 mmHg  Pulse 77  Ht 6\' 2"  (1.88 m)  Wt 215 lb 12.8 oz (97.886 kg)  BMI 27.70 kg/m2  SpO2 97% Well developed and well nourished in no acute distress HENT normal E scleral and icterus clear Neck Supple JVP flat; carotids brisk and full Clear to ausculation  Regular rate and rhythm, no murmurs gallops or rub Soft with active bowel sounds No clubbing cyanosis pitting, non-pitting  Edema Alert and oriented, grossly normal motor and sensory function Skin Warm and Dry  ECG P-synchronous/ AV  pacing  31/11/4 the NOACs to call us44   Assessment and  Plan  Ischemic heart disease with prior bypass  Ischemic cardiomyopathy ejection 35-40%  Congestive heart failure-chronic-systolic  Implantable defibrillator-St. Jude The patient's device was interrogated.  The information was reviewed.  Device was reprogrammed as below  Presyncope  Atrial fibrillation on dofetilide   Ventricular tachycardia nonsustained      . We discussed the use of the NOACs compared to Coumadin. We briefly reviewed the data of at least  comparability in stroke prevention, bleeding and outcome. We discussed some of the new once wherein somewhat associated with decreased ischemic stroke risk, one to be taken daily, and has been shown to be comparable and bleeding risk to aspirin.  We also discussed bleeding associated with warfarin as well as NOACs and a wall bleeding as a complication of all these drugs intracranial bleeding is more frequently associated with warfarin then the NOACs and a GI bleeding is more commonly associated with the latter  No intercurrent Ventricular tachycardia  Without symptoms of ischemia  Euvolemic continue current meds

## 2015-07-21 NOTE — Patient Instructions (Signed)
Medication Instructions: - Your physician recommends that you continue on your current medications as directed. Please refer to the Current Medication list given to you today.  Labwork: - none  Procedures/Testing: - none  Follow-Up: - Your physician wants you to follow-up in: 6 months with Renee Ursuy, PA for Dr. Klein. You will receive a reminder letter in the mail two months in advance. If you don't receive a letter, please call our office to schedule the follow-up appointment.  Any Additional Special Instructions Will Be Listed Below (If Applicable).     If you need a refill on your cardiac medications before your next appointment, please call your pharmacy.   

## 2015-07-21 NOTE — Telephone Encounter (Signed)
Spoke with patient about his Insurance. I have submitted a PA for Xarelto to [place].

## 2015-07-21 NOTE — Telephone Encounter (Signed)
Prior auth for Xarelto 20mg  submitted to [facility].

## 2015-07-21 NOTE — Telephone Encounter (Signed)
New message      The pharmacy called the insurance company   Pt c/o medication issue:  1. Name of Medication: Xarelto  2. How are you currently taking this medication (dosage and times per day)? Not provideded  3. Are you having a reaction (difficulty breathing--STAT)? no  4. What is your medication issue? The insurance told the pharmacist that it was a $30 co-pay, and it will need the MD to do prior authorization before the pt can get it.

## 2015-07-26 ENCOUNTER — Telehealth: Payer: Self-pay | Admitting: *Deleted

## 2015-07-26 NOTE — Telephone Encounter (Signed)
please call regarding xarelto, he has questions regarding the new RX, thanks

## 2015-07-27 NOTE — Telephone Encounter (Signed)
I called and spoke with the patient. I answered his questions regarding Xarelto. Also clarified with Dr. Caryl Comes that the patient will stop his Aspirin when starting Xarelto.

## 2015-07-27 NOTE — Telephone Encounter (Signed)
Pt calling back regarding med question left from yesterday-pls call pt getting anxious to get questions answered-I explained-and apologized you didn't call him back yesterday because you were not in and message should have gone to triage

## 2015-08-24 ENCOUNTER — Telehealth: Payer: Self-pay | Admitting: *Deleted

## 2015-08-24 NOTE — Telephone Encounter (Signed)
Patient called in stating that he has noticed some dizziness since his last appt with Dr.Klein. He wants to know if his spells correlate with device findings. I asked patient to send in a remote for review. Verbal instructions given.  Plan to call patient once remote has been received.

## 2015-08-24 NOTE — Telephone Encounter (Signed)
Remote transmission received. Presenting rhythm: AF/Vp w/PVCs. 9.7% AF burden + [name]/Xarelto. (1) monomorphic VT-NS episode on 08/01/15 x 12 bts @ 66/161bpm. Normal device function.  Patient c/o a spell from yesterday x 3 mins at 1417 where he felt dizziness, double vision, and overheated. Patient states that he was sitting down at the time of the episode. He also says that another time he has noticed this sensation was on the 16th x 2 mins at 1640. He did mention that he is a diabetic and did not check his CBG during the time of the episodes. He said that with both instances he had eaten just a few hours before. He also says that the sx's resolve without intervention, but once they pass he has a headache that he treats with Advil.  I encouraged patient to check his CBG regularly since the device did not record any sustained ventricular arrhythmias >143bpm. Patient voiced understanding.   Plan to inform Dr.Klein about patient's spells and notify patient if anything further is recommended.

## 2015-08-25 ENCOUNTER — Other Ambulatory Visit (INDEPENDENT_AMBULATORY_CARE_PROVIDER_SITE_OTHER): Payer: Medicare Other

## 2015-08-25 DIAGNOSIS — I480 Paroxysmal atrial fibrillation: Secondary | ICD-10-CM | POA: Diagnosis not present

## 2015-08-25 LAB — BASIC METABOLIC PANEL
BUN: 11 mg/dL (ref 7–25)
CO2: 24 mmol/L (ref 20–31)
Calcium: 8.7 mg/dL (ref 8.6–10.3)
Chloride: 100 mmol/L (ref 98–110)
Creat: 1.06 mg/dL (ref 0.70–1.11)
Glucose, Bld: 150 mg/dL — ABNORMAL HIGH (ref 65–99)
Potassium: 4.3 mmol/L (ref 3.5–5.3)
Sodium: 134 mmol/L — ABNORMAL LOW (ref 135–146)

## 2015-08-25 LAB — CBC WITH DIFFERENTIAL/PLATELET
Basophils Absolute: 37 cells/uL (ref 0–200)
Basophils Relative: 1 %
Eosinophils Absolute: 185 cells/uL (ref 15–500)
Eosinophils Relative: 5 %
HCT: 42.2 % (ref 38.5–50.0)
Hemoglobin: 14.2 g/dL (ref 13.2–17.1)
Lymphocytes Relative: 20 %
Lymphs Abs: 740 cells/uL — ABNORMAL LOW (ref 850–3900)
MCH: 33.3 pg — ABNORMAL HIGH (ref 27.0–33.0)
MCHC: 33.6 g/dL (ref 32.0–36.0)
MCV: 98.8 fL (ref 80.0–100.0)
MPV: 9.3 fL (ref 7.5–12.5)
Monocytes Absolute: 407 cells/uL (ref 200–950)
Monocytes Relative: 11 %
Neutro Abs: 2331 cells/uL (ref 1500–7800)
Neutrophils Relative %: 63 %
Platelets: 125 10*3/uL — ABNORMAL LOW (ref 140–400)
RBC: 4.27 MIL/uL (ref 4.20–5.80)
RDW: 14.1 % (ref 11.0–15.0)
WBC: 3.7 10*3/uL — ABNORMAL LOW (ref 3.8–10.8)

## 2015-08-31 DIAGNOSIS — H35043 Retinal micro-aneurysms, unspecified, bilateral: Secondary | ICD-10-CM | POA: Diagnosis not present

## 2015-08-31 DIAGNOSIS — H43813 Vitreous degeneration, bilateral: Secondary | ICD-10-CM | POA: Diagnosis not present

## 2015-08-31 DIAGNOSIS — H35372 Puckering of macula, left eye: Secondary | ICD-10-CM | POA: Diagnosis not present

## 2015-08-31 DIAGNOSIS — E113393 Type 2 diabetes mellitus with moderate nonproliferative diabetic retinopathy without macular edema, bilateral: Secondary | ICD-10-CM | POA: Diagnosis not present

## 2015-09-02 ENCOUNTER — Encounter: Payer: Self-pay | Admitting: Internal Medicine

## 2015-09-03 ENCOUNTER — Encounter: Payer: Self-pay | Admitting: Internal Medicine

## 2015-09-03 NOTE — Telephone Encounter (Signed)
DISCUSSED WITH  SALLY  . PT  AWARE  WILL LET  DR Caryl Comes   KNOW .  PT  LOOKED  UP  SIDE  EFFECTS  ON XARELTO WEB SITE AND  ALSO  SUGGESTS  ELIQUIS   HAVE  SAME EFECTS  INFORMED PT  THAT  HE  IS  AT INCREASE  RISK OF  TIA  OR  STROKE  WILL FORWARD TO  DR   [place] .Adonis Housekeeper

## 2015-09-05 ENCOUNTER — Encounter: Payer: Self-pay | Admitting: Internal Medicine

## 2015-09-13 ENCOUNTER — Telehealth: Payer: Self-pay

## 2015-09-13 NOTE — Telephone Encounter (Signed)
Informed patient that his [name] 500 mcg 3 bottles have arrived.

## 2015-09-20 ENCOUNTER — Other Ambulatory Visit: Payer: Self-pay | Admitting: Internal Medicine

## 2015-10-20 ENCOUNTER — Ambulatory Visit (INDEPENDENT_AMBULATORY_CARE_PROVIDER_SITE_OTHER): Payer: Medicare Other | Admitting: *Deleted

## 2015-10-20 DIAGNOSIS — I255 Ischemic cardiomyopathy: Secondary | ICD-10-CM

## 2015-10-20 NOTE — Progress Notes (Signed)
Remote ICD transmission.   

## 2015-10-22 ENCOUNTER — Encounter: Payer: Self-pay | Admitting: Cardiology

## 2015-10-22 DIAGNOSIS — I480 Paroxysmal atrial fibrillation: Secondary | ICD-10-CM | POA: Diagnosis not present

## 2015-10-22 DIAGNOSIS — E559 Vitamin D deficiency, unspecified: Secondary | ICD-10-CM | POA: Diagnosis not present

## 2015-10-22 DIAGNOSIS — E782 Mixed hyperlipidemia: Secondary | ICD-10-CM | POA: Diagnosis not present

## 2015-10-22 DIAGNOSIS — E1159 Type 2 diabetes mellitus with other circulatory complications: Secondary | ICD-10-CM | POA: Diagnosis not present

## 2015-10-22 DIAGNOSIS — E113393 Type 2 diabetes mellitus with moderate nonproliferative diabetic retinopathy without macular edema, bilateral: Secondary | ICD-10-CM | POA: Diagnosis not present

## 2015-10-26 DIAGNOSIS — E113393 Type 2 diabetes mellitus with moderate nonproliferative diabetic retinopathy without macular edema, bilateral: Secondary | ICD-10-CM | POA: Diagnosis not present

## 2015-10-26 DIAGNOSIS — Z23 Encounter for immunization: Secondary | ICD-10-CM | POA: Diagnosis not present

## 2015-10-26 DIAGNOSIS — E1159 Type 2 diabetes mellitus with other circulatory complications: Secondary | ICD-10-CM | POA: Diagnosis not present

## 2015-10-26 DIAGNOSIS — I480 Paroxysmal atrial fibrillation: Secondary | ICD-10-CM | POA: Diagnosis not present

## 2015-10-26 DIAGNOSIS — I2510 Atherosclerotic heart disease of native coronary artery without angina pectoris: Secondary | ICD-10-CM | POA: Diagnosis not present

## 2015-10-26 DIAGNOSIS — E559 Vitamin D deficiency, unspecified: Secondary | ICD-10-CM | POA: Diagnosis not present

## 2015-10-26 DIAGNOSIS — E782 Mixed hyperlipidemia: Secondary | ICD-10-CM | POA: Diagnosis not present

## 2015-11-09 LAB — CUP PACEART REMOTE DEVICE CHECK
Battery Remaining Longevity: 99 mo
Battery Voltage: 3.01 V
Brady Statistic AP VP Percent: 69.22 %
Brady Statistic AP VS Percent: 18.64 %
Brady Statistic AS VP Percent: 1.45 %
Brady Statistic AS VS Percent: 10.68 %
Brady Statistic RA Percent Paced: 87.87 %
Brady Statistic RV Percent Paced: 69.35 %
Date Time Interrogation Session: 20170920131420
HighPow Impedance: 43 Ohm
HighPow Impedance: 59 Ohm
Implantable Lead Implant Date: 20040309
Implantable Lead Implant Date: 20040309
Implantable Lead Location: 753859
Implantable Lead Location: 753860
Implantable Lead Model: 158
Implantable Lead Model: 5076
Implantable Lead Serial Number: 115061
Lead Channel Impedance Value: 4047 Ohm
Lead Channel Impedance Value: 4047 Ohm
Lead Channel Impedance Value: 4047 Ohm
Lead Channel Impedance Value: 4047 Ohm
Lead Channel Impedance Value: 4047 Ohm
Lead Channel Impedance Value: 4047 Ohm
Lead Channel Impedance Value: 4047 Ohm
Lead Channel Impedance Value: 4047 Ohm
Lead Channel Impedance Value: 4047 Ohm
Lead Channel Impedance Value: 4047 Ohm
Lead Channel Impedance Value: 418 Ohm
Lead Channel Impedance Value: 513 Ohm
Lead Channel Impedance Value: 532 Ohm
Lead Channel Pacing Threshold Amplitude: 0.5 V
Lead Channel Pacing Threshold Amplitude: 0.625 V
Lead Channel Pacing Threshold Pulse Width: 0.4 ms
Lead Channel Pacing Threshold Pulse Width: 0.4 ms
Lead Channel Sensing Intrinsic Amplitude: 2.5 mV
Lead Channel Sensing Intrinsic Amplitude: 2.5 mV
Lead Channel Sensing Intrinsic Amplitude: 8 mV
Lead Channel Sensing Intrinsic Amplitude: 8 mV
Lead Channel Setting Pacing Amplitude: 2 V
Lead Channel Setting Pacing Amplitude: 2.5 V
Lead Channel Setting Pacing Pulse Width: 0.4 ms
Lead Channel Setting Sensing Sensitivity: 0.3 mV

## 2015-11-28 ENCOUNTER — Encounter: Payer: Self-pay | Admitting: Internal Medicine

## 2015-12-07 ENCOUNTER — Telehealth: Payer: Self-pay | Admitting: *Deleted

## 2015-12-07 NOTE — Telephone Encounter (Signed)
Called patient to let him know that his tikosyn had arrived from the company.

## 2015-12-17 ENCOUNTER — Encounter: Payer: Self-pay | Admitting: Physician Assistant

## 2015-12-21 ENCOUNTER — Encounter: Payer: Self-pay | Admitting: Physician Assistant

## 2015-12-29 ENCOUNTER — Encounter: Payer: Medicare Other | Admitting: Physician Assistant

## 2016-01-04 ENCOUNTER — Encounter: Payer: Self-pay | Admitting: Internal Medicine

## 2016-01-04 ENCOUNTER — Ambulatory Visit (INDEPENDENT_AMBULATORY_CARE_PROVIDER_SITE_OTHER): Payer: Medicare Other | Admitting: Internal Medicine

## 2016-01-04 VITALS — BP 100/60 | HR 86 | Ht 74.0 in | Wt 212.0 lb

## 2016-01-04 DIAGNOSIS — I480 Paroxysmal atrial fibrillation: Secondary | ICD-10-CM | POA: Diagnosis not present

## 2016-01-04 DIAGNOSIS — I5022 Chronic systolic (congestive) heart failure: Secondary | ICD-10-CM

## 2016-01-04 DIAGNOSIS — Z79899 Other long term (current) drug therapy: Secondary | ICD-10-CM

## 2016-01-04 DIAGNOSIS — I2589 Other forms of chronic ischemic heart disease: Secondary | ICD-10-CM | POA: Diagnosis not present

## 2016-01-04 DIAGNOSIS — I255 Ischemic cardiomyopathy: Secondary | ICD-10-CM

## 2016-01-04 DIAGNOSIS — Z95810 Presence of automatic (implantable) cardiac defibrillator: Secondary | ICD-10-CM

## 2016-01-04 LAB — CUP PACEART INCLINIC DEVICE CHECK
Battery Remaining Longevity: 95 mo
Battery Voltage: 3 V
Brady Statistic AP VP Percent: 67.76 %
Brady Statistic AP VS Percent: 18.03 %
Brady Statistic AS VP Percent: 2.83 %
Brady Statistic AS VS Percent: 11.38 %
Brady Statistic RA Percent Paced: 83.02 %
Brady Statistic RV Percent Paced: 69.43 %
Date Time Interrogation Session: 20171205175228
HighPow Impedance: 41 Ohm
HighPow Impedance: 59 Ohm
Implantable Lead Implant Date: 20040309
Implantable Lead Implant Date: 20040309
Implantable Lead Location: 753859
Implantable Lead Location: 753860
Implantable Lead Model: 158
Implantable Lead Model: 5076
Implantable Lead Serial Number: 115061
Implantable Pulse Generator Implant Date: 20161007
Lead Channel Impedance Value: 4047 Ohm
Lead Channel Impedance Value: 4047 Ohm
Lead Channel Impedance Value: 4047 Ohm
Lead Channel Impedance Value: 4047 Ohm
Lead Channel Impedance Value: 4047 Ohm
Lead Channel Impedance Value: 4047 Ohm
Lead Channel Impedance Value: 4047 Ohm
Lead Channel Impedance Value: 4047 Ohm
Lead Channel Impedance Value: 4047 Ohm
Lead Channel Impedance Value: 4047 Ohm
Lead Channel Impedance Value: 418 Ohm
Lead Channel Impedance Value: 513 Ohm
Lead Channel Impedance Value: 513 Ohm
Lead Channel Pacing Threshold Amplitude: 0.5 V
Lead Channel Pacing Threshold Amplitude: 0.75 V
Lead Channel Pacing Threshold Pulse Width: 0.04 ms
Lead Channel Pacing Threshold Pulse Width: 0.4 ms
Lead Channel Sensing Intrinsic Amplitude: 1.4 mV
Lead Channel Sensing Intrinsic Amplitude: 6.3 mV
Lead Channel Setting Pacing Amplitude: 2 V
Lead Channel Setting Pacing Amplitude: 2.5 V
Lead Channel Setting Pacing Pulse Width: 0.4 ms
Lead Channel Setting Sensing Sensitivity: 0.3 mV

## 2016-01-04 NOTE — Patient Instructions (Addendum)
Medication Instructions: - Your physician recommends that you continue on your current medications as directed. Please refer to the Current Medication list given to you today.  Labwork: - Your physician recommends that you that you have lab work today: BMP/ Magnesium  Procedures/Testing: - none ordered  Follow-Up: -Your physician recommends that you schedule a follow-up appointment in: 4 months with Dr. Caryl Comes.  Any Additional Special Instructions Will Be Listed Below (If Applicable).     If you need a refill on your cardiac medications before your next appointment, please call your pharmacy.

## 2016-01-04 NOTE — Progress Notes (Signed)
Patient Care Team: [name], MD as PCP - General (Family Medicine)   HPI  [name] is a 82 y.o. male  Seen in followup for polymorphic ventricular tachycardia for which he has an ICD and atrial fibrillation.  He was seen November/13 because of 2 episodes of polymorphic ventricular tachycardia occurring in the preceding few weeks after having been quiet for months and months. There was no clear inciting event. Beta blocker therapy was gently uptitrated.  He underwent ICD generator replacement 9/16  He also has a history of coronary disease with prior CABG mitral valve repair both of which were done at [facility] clinic at that time. he also had a PFO closure.  He underwent catheterization 6/14 demonstrating patency of the LIMA to the LAD and RIMA to his obtuse marginal. Ejection fraction is 35-40%  He Is on dofetilide for his arrhythmias when he was seen last year complaints of increasing exercise intolerance and chest discomfort.  He is sending email during the summer asking about patient activated cardioversion via his device. I failed answering. He is here today to discuss that.  He remains not on anticoagulant been averse to such things;  most recently we tried him on Rivaroxaban but he had a significant increase in his migraines. He stopped.  He has had problems with recurrent atrial fibrillation. He notes it mostly when he rides his bike in the morning and have rapid heart rates. Otherwise, he is largely unaware  9/16 creatinine 0.99 K4.2  mag 2.1      This had one intercurrent episode of near syncope associated with nonsustained ventricular tachycardia.   Past Medical History:  Diagnosis Date  . Coronary artery disease    [place] [facility] [facility]  . Diabetes mellitus   . Endocarditis, valve unspecified    Mitral  . History of migraine headaches   . History of seasonal allergies   . ICD (implantable cardiac defibrillator) in place   . PAF (paroxysmal  atrial fibrillation) ([place])   . Pleural effusion   . PVC's (premature ventricular contractions)   . Raynaud's disease   . Ventricular tachycardia, polymorphic ([place])    status post ICD implantation    Past Surgical History:  Procedure Laterality Date  . CARDIAC CATHETERIZATION  04/03/2007   EF 40%  . CARDIAC CATHETERIZATION  02/06/2006   EF 45%. ANTERIOR HYPOKINESIS  . CARDIAC CATHETERIZATION  05/29/2000   EF 50%. MILD ANTERIOR HYPOKINESIS  . CARDIAC CATHETERIZATION  10/25/1999   EF 50%. SEVERE MITRAL REGURGITATION  . CARDIAC CATHETERIZATION  01/06/2003   EF 50%  . CARDIAC DEFIBRILLATOR PLACEMENT    . CORONARY ARTERY BYPASS GRAFT  2001   2 VESSEL CABG AND MITRAL VALVE REPAIR. HE HAD A LIMA GRAFT TO THE LAD AND RADIAL ARTERY  GRAFT TO THE OBTUSE MARGINAL  OF LEFT CIRCUMFLEX  . EP IMPLANTABLE DEVICE N/A 11/06/2014   Procedure: ICD Generator Changeout;  Surgeon: Deboraha Sprang, MD;  Location: [place] CV LAB;  Service: Cardiovascular;  Laterality: N/A;  . HEMORROIDECTOMY    . KNEE ARTHROSCOPY     RIGHT KNEE  . LEFT HEART CATHETERIZATION WITH CORONARY ANGIOGRAM N/A 07/04/2012   Procedure: LEFT HEART CATHETERIZATION WITH CORONARY ANGIOGRAM;  Surgeon: Sherren Mocha, MD;  Location: [facility] CATH LAB;  Service: Cardiovascular;  Laterality: N/A;  . MITRAL VALVE REPLACEMENT    . TRANSTHORACIC ECHOCARDIOGRAM  04/02/2007   EF 35%    Current Outpatient Prescriptions  Medication Sig Dispense Refill  . atorvastatin (LIPITOR)  80 MG tablet Take 1 tablet by mouth Daily.    . Cholecalciferol (VITAMIN D-3) 5000 UNITS TABS Take 5,000-10,000 Units by mouth daily. EVERY OTHER DAY. Take 1 tablet four times a WEEK and then take 2 tablets three times a WEEK.    Marland Kitchen EPIPEN 2-PAK 0.3 MG/0.3ML SOAJ injection USE ONLY IN EMERGENCY    . fluticasone (FLONASE) 50 MCG/ACT nasal spray Place 1 spray into the nose daily as needed for rhinitis or allergies.    . Magnesium 250 MG TABS Take 250 mg by mouth daily.     . metoprolol  succinate (TOPROL-XL) 50 MG 24 hr tablet TAKE ONE TABLET BY MOUTH DAILY IMMEDIATELY FOLLOWING A MEAL 90 tablet 1  . Multiple Vitamin (MULTIVITAMIN WITH MINERALS) TABS Take 1 tablet by mouth daily.    . Multiple Vitamins-Minerals (PRESERVISION AREDS 2) CAPS Take 2 capsules by mouth daily.    . nateglinide (STARLIX) 120 MG tablet Take 1 tablet by mouth Twice daily.    . pioglitazone (ACTOS) 45 MG tablet Take 45 mg by mouth daily.      . sitaGLIPtin (JANUVIA) 100 MG tablet Take 100 mg by mouth daily.      Marland Kitchen [name] 500 MCG capsule Take 1 capsule (500 mcg total) by mouth 2 (two) times daily. 180 capsule 3   No current facility-administered medications for this visit.     Allergies  Allergen Reactions  . Latex     REDNESS AT REACTION SITE.  Marland Kitchen Other     BEE STINGS   . Sotalol     "BLUE TOES"- cut his circulation off  . Warfarin Sodium     REACTION: migraine headaches/vision impariment    Review of Systems negative except from HPI and PMH  Physical Exam BP 100/60   Pulse 86   Ht 6\' 2"  (1.88 m)   Wt 212 lb (96.2 kg)   SpO2 96%   BMI 27.22 kg/m  Well developed and well nourished in no acute distress HENT normal E scleral and icterus clear Neck Supple JVP flat; carotids brisk and full Clear to ausculation  Regular rate and rhythm, no murmurs gallops or rub Soft with active bowel sounds No clubbing cyanosis pitting, non-pitting  Edema Alert and oriented, grossly normal motor and sensory function Skin Warm and Dry  ECG P-synchronous/ AV  Pacing with pseudofusion  31/11/4 the NOACs to call us44   Assessment and  Plan  Ischemic heart disease with prior bypass  Ischemic cardiomyopathy ejection 35-40%  Congestive heart failure-chronic-systolic  Implantable defibrillator-St. Jude The patient's device was interrogated.  The information was reviewed.  Device was reprogrammed as below  Presyncope  Atrial fibrillation on dofetilide   First-degree AV  block-profound  Ventricular tachycardia nonsustained     He is not inclined to try anticoagulation therapy.  No intercurrent Ventricular tachycardia  Without symptoms of ischemia  Euvolemic continue current meds   We had a lengthy discussion regarding the algorithm for automatic and patient activated cardioversion in his device. We also reviewed his atrial fibrillation episodes most of which are less than 1-2 hours; he has one that was 24 hours long about a year ago. We have converged on a consensus. This will include that if he is out of rhythm for more than 12 hours and still out of rhythm at 5 AM that his device will deliver a automatic shock. In the event that he has 2 consecutive days in which she notes his heart rate is fast when he rides  his bicycle he will let us know and we will transmit to see if we can understand whether a-cardioversion was unsuccessful, B-he had reversion of atrial fibrillation following cardioversion for C-he simply had 2 separate episodes of atrial fibrillation. We will check dofetilide surveillance laboratories.

## 2016-01-05 LAB — BASIC METABOLIC PANEL
BUN: 14 mg/dL (ref 7–25)
CO2: 29 mmol/L (ref 20–31)
Calcium: 9.1 mg/dL (ref 8.6–10.3)
Chloride: 102 mmol/L (ref 98–110)
Creat: 1.08 mg/dL (ref 0.70–1.11)
Glucose, Bld: 117 mg/dL — ABNORMAL HIGH (ref 65–99)
Potassium: 4.6 mmol/L (ref 3.5–5.3)
Sodium: 137 mmol/L (ref 135–146)

## 2016-01-05 LAB — MAGNESIUM: Magnesium: 2.1 mg/dL (ref 1.5–2.5)

## 2016-02-22 ENCOUNTER — Telehealth: Payer: Self-pay

## 2016-02-22 NOTE — Telephone Encounter (Signed)
[name] 500 mcg arrived from [employer] [facility]. 3 bottles of 60 each, placed at the front desk. Patient notified.

## 2016-03-13 ENCOUNTER — Other Ambulatory Visit: Payer: Self-pay | Admitting: Internal Medicine

## 2016-04-21 DIAGNOSIS — I480 Paroxysmal atrial fibrillation: Secondary | ICD-10-CM | POA: Diagnosis not present

## 2016-04-21 DIAGNOSIS — E1159 Type 2 diabetes mellitus with other circulatory complications: Secondary | ICD-10-CM | POA: Diagnosis not present

## 2016-04-21 DIAGNOSIS — E782 Mixed hyperlipidemia: Secondary | ICD-10-CM | POA: Diagnosis not present

## 2016-05-09 ENCOUNTER — Ambulatory Visit (INDEPENDENT_AMBULATORY_CARE_PROVIDER_SITE_OTHER): Payer: Medicare Other | Admitting: Internal Medicine

## 2016-05-09 ENCOUNTER — Encounter: Payer: Self-pay | Admitting: Internal Medicine

## 2016-05-09 VITALS — BP 112/74 | HR 87 | Ht 74.0 in | Wt 216.0 lb

## 2016-05-09 DIAGNOSIS — I5022 Chronic systolic (congestive) heart failure: Secondary | ICD-10-CM | POA: Diagnosis not present

## 2016-05-09 DIAGNOSIS — I255 Ischemic cardiomyopathy: Secondary | ICD-10-CM | POA: Diagnosis not present

## 2016-05-09 DIAGNOSIS — Z79899 Other long term (current) drug therapy: Secondary | ICD-10-CM

## 2016-05-09 DIAGNOSIS — I2589 Other forms of chronic ischemic heart disease: Secondary | ICD-10-CM

## 2016-05-09 DIAGNOSIS — I480 Paroxysmal atrial fibrillation: Secondary | ICD-10-CM

## 2016-05-09 DIAGNOSIS — Z95810 Presence of automatic (implantable) cardiac defibrillator: Secondary | ICD-10-CM | POA: Diagnosis not present

## 2016-05-09 LAB — CUP PACEART INCLINIC DEVICE CHECK
Battery Remaining Longevity: 89 mo
Battery Voltage: 3 V
Brady Statistic AP VP Percent: 73.33 %
Brady Statistic AP VS Percent: 12.23 %
Brady Statistic AS VP Percent: 4.05 %
Brady Statistic AS VS Percent: 10.38 %
Brady Statistic RA Percent Paced: 82.1 %
Brady Statistic RV Percent Paced: 76 %
Date Time Interrogation Session: 20180410170137
HighPow Impedance: 44 Ohm
HighPow Impedance: 60 Ohm
Implantable Lead Implant Date: 20040309
Implantable Lead Implant Date: 20040309
Implantable Lead Location: 753859
Implantable Lead Location: 753860
Implantable Lead Model: 158
Implantable Lead Model: 5076
Implantable Lead Serial Number: 115061
Implantable Pulse Generator Implant Date: 20161007
Lead Channel Impedance Value: 4047 Ohm
Lead Channel Impedance Value: 4047 Ohm
Lead Channel Impedance Value: 4047 Ohm
Lead Channel Impedance Value: 4047 Ohm
Lead Channel Impedance Value: 4047 Ohm
Lead Channel Impedance Value: 4047 Ohm
Lead Channel Impedance Value: 4047 Ohm
Lead Channel Impedance Value: 4047 Ohm
Lead Channel Impedance Value: 4047 Ohm
Lead Channel Impedance Value: 4047 Ohm
Lead Channel Impedance Value: 418 Ohm
Lead Channel Impedance Value: 513 Ohm
Lead Channel Impedance Value: 513 Ohm
Lead Channel Pacing Threshold Amplitude: 0.5 V
Lead Channel Pacing Threshold Amplitude: 0.75 V
Lead Channel Pacing Threshold Pulse Width: 0.4 ms
Lead Channel Pacing Threshold Pulse Width: 0.4 ms
Lead Channel Sensing Intrinsic Amplitude: 10.625 mV
Lead Channel Sensing Intrinsic Amplitude: 3.625 mV
Lead Channel Setting Pacing Amplitude: 2 V
Lead Channel Setting Pacing Amplitude: 2.5 V
Lead Channel Setting Pacing Pulse Width: 0.4 ms
Lead Channel Setting Sensing Sensitivity: 0.3 mV

## 2016-05-09 NOTE — Patient Instructions (Addendum)
Medication Instructions: - Your physician recommends that you continue on your current medications as directed. Please refer to the Current Medication list given to you today.  Labwork: - none ordered  Procedures/Testing: - none ordered  Follow-Up: - Remote monitoring is used to monitor your Pacemaker of ICD from home. This monitoring reduces the number of office visits required to check your device to one time per year. It allows us to keep an eye on the functioning of your device to ensure it is working properly. You are scheduled for a device check from home on 08/08/16. You may send your transmission at any time that day. If you have a wireless device, the transmission will be sent automatically. After your physician reviews your transmission, you will receive a postcard with your next transmission date.  - Your physician wants you to follow-up in: 6 months with Dr. Klein. You will receive a reminder letter in the mail two months in advance. If you don't receive a letter, please call our office to schedule the follow-up appointment.   Any Additional Special Instructions Will Be Listed Below (If Applicable).     If you need a refill on your cardiac medications before your next appointment, please call your pharmacy.   

## 2016-05-09 NOTE — Progress Notes (Signed)
Patient Care Team: [name], MD as PCP - General (Family Medicine)   HPI  [name] is a 83 y.o. male Seen in followup for polymorphic ventricular tachycardia for which he has an ICD and atrial fibrillation.  He was seen November 13 because of 2 episodes of polymorphic ventricular tachycardia occurring in the preceding few weeks after having been quiet for months and months. There was no clear inciting event. Beta blocker therapy was gently uptitrated.  No intercurrent symptomatic VT.    He underwent ICD generator replacement 9/16  He also has a history of coronary disease with prior CABG mitral valve repair both of which were done at [facility] clinic at that time. he also had a PFO closure.  He underwent catheterization 6/14 demonstrating patency of the LIMA to the LAD and RIMA to his obtuse marginal. Ejection fraction is 35-40% 2/17  Echo EF 30%   He Is on dofetilide for his arrhythmias when he was seen last year complaints of increasing exercise intolerance and chest discomfort.   He has frequent PVCs  We had set up automatic DCCV via ICD for recurrent atrial arrhythmia   He has been shocked twice with success.   He remains not on anticoagulant been averse to such things.    9/16 creatinine 0.99 K4.2  mag 2.1     Date Cr K Mg  9/16  0.99 4.2 2.1  12/17  1.08 4.6 2.1     This had one intercurrent episode of near syncope associated with nonsustained ventricular tachycardia.   Past Medical History:  Diagnosis Date  . [place]    [place] [facility]  . Diabetes mellitus   . Endocarditis, valve unspecified    Mitral  . History of migraine headaches   . History of seasonal allergies   . ICD (implantable cardiac defibrillator) in place   . PAF (paroxysmal atrial fibrillation) ([place])   . Pleural effusion   . PVC's (premature ventricular contractions)   . Raynaud's disease   . Ventricular tachycardia, polymorphic ([place])    status  post ICD implantation    Past Surgical History:  Procedure Laterality Date  . CARDIAC CATHETERIZATION  04/03/2007   EF 40%  . CARDIAC CATHETERIZATION  02/06/2006   EF 45%. ANTERIOR HYPOKINESIS  . CARDIAC CATHETERIZATION  05/29/2000   EF 50%. MILD ANTERIOR HYPOKINESIS  . CARDIAC CATHETERIZATION  10/25/1999   EF 50%. SEVERE MITRAL REGURGITATION  . CARDIAC CATHETERIZATION  01/06/2003   EF 50%  . CARDIAC DEFIBRILLATOR PLACEMENT    . CORONARY ARTERY BYPASS GRAFT  2001   2 VESSEL CABG AND MITRAL VALVE REPAIR. HE HAD A LIMA GRAFT TO THE LAD AND RADIAL ARTERY  GRAFT TO THE OBTUSE MARGINAL  OF LEFT CIRCUMFLEX  . EP IMPLANTABLE DEVICE N/A 11/06/2014   Procedure: ICD Generator Changeout;  Surgeon: Deboraha Sprang, MD;  Location: [place] CV [facility];  Service: Cardiovascular;  Laterality: N/A;  . HEMORROIDECTOMY    . KNEE ARTHROSCOPY     RIGHT KNEE  . LEFT HEART CATHETERIZATION WITH CORONARY ANGIOGRAM N/A 07/04/2012   Procedure: LEFT HEART CATHETERIZATION WITH CORONARY ANGIOGRAM;  Surgeon: Sherren Mocha, MD;  Location: [facility] CATH LAB;  Service: Cardiovascular;  Laterality: N/A;  . MITRAL VALVE REPLACEMENT    . TRANSTHORACIC ECHOCARDIOGRAM  04/02/2007   EF 35%    Current Outpatient Prescriptions  Medication Sig Dispense Refill  . atorvastatin (LIPITOR) 80 MG tablet Take 1 tablet by mouth Daily.    Marland Kitchen  Cholecalciferol (VITAMIN D-3) 5000 UNITS TABS Take 5,000-10,000 Units by mouth daily. EVERY OTHER DAY. Take 1 tablet four times a WEEK and then take 2 tablets three times a WEEK.    Marland Kitchen EPIPEN 2-PAK 0.3 MG/0.3ML SOAJ injection USE ONLY IN EMERGENCY    . fluticasone (FLONASE) 50 MCG/ACT nasal spray Place 1 spray into the nose daily as needed for rhinitis or allergies.    . Magnesium 250 MG TABS Take 250 mg by mouth daily.     . metoprolol succinate (TOPROL-XL) 50 MG 24 hr tablet TAKE ONE TABLET BY MOUTH DAILY IMMEDIATELY FOLLOWING A MEAL 90 tablet 1  . Multiple Vitamin (MULTIVITAMIN WITH MINERALS) TABS Take 1  tablet by mouth daily.    . Multiple Vitamins-Minerals (PRESERVISION AREDS 2) CAPS Take 2 capsules by mouth daily.    . nateglinide (STARLIX) 120 MG tablet Take 1 tablet by mouth Twice daily.    . pioglitazone (ACTOS) 45 MG tablet Take 45 mg by mouth daily.      . sitaGLIPtin (JANUVIA) 100 MG tablet Take 100 mg by mouth daily.      Marland Kitchen [name] 500 MCG capsule Take 1 capsule (500 mcg total) by mouth 2 (two) times daily. 180 capsule 3   No current facility-administered medications for this visit.     Allergies  Allergen Reactions  . Latex     REDNESS AT REACTION SITE.  Marland Kitchen Other     BEE STINGS   . Sotalol     "BLUE TOES"- cut his circulation off  . Warfarin Sodium     REACTION: migraine headaches/vision impariment    Review of Systems negative except from HPI and PMH  Physical Exam There were no vitals taken for this visit. Well developed and well nourished in no acute distress HENT normal E scleral and icterus clear Neck Supple JVP flat; carotids brisk and full Clear to ausculation  Regular rate and rhythm, no murmurs gallops or rub Soft with active bowel sounds No clubbing cyanosis pitting, non-pitting  Edema Alert and oriented, grossly normal motor and sensory function Skin Warm and Dry  ECG Personally reviewed  P-synchronous/ AV  Pacing   Intervals 36/16/48 PVCs right bundle superior axis     Assessment and  Plan  Ischemic heart disease with prior bypass  Ischemic cardiomyopathy ejection 35-40%  Congestive heart failure-chronic-systolic  Implantable defibrillator-Medtronic  The patient's device was interrogated.  The information was reviewed.  Device was reprogrammed as below  Presyncope  PVCs  Atrial fibrillation on dofetilide   First-degree AV block-profound  Ventricular tachycardia nonsustained     He remains disinclined to try anticoagulation therapy.  Some intercurrent nonsustained  Ventricular tachycardia  Without symptoms of  ischemia  Euvolemic continue current meds   His experience with home cardioversion for his atrial fibrillation has been tolerable. We discussed alternatives including changing the time and concomitant use of benzodiazepines. For right now he would like to continue doing it.  We discussed catheter ablation of both his pulmonary veins as well as his PVC target as there has been progressive deterioration of LV systolic function. Having had his valve surgery and other work done at [facility] I told him I would discuss this with them.  More than 50% of 45 min was spent in counseling related to the above  We will also shorten his AV delay as he is having significant ventricular pacing and to improve AV synchrony. This may also decrease his PVCs.  Dofetilide surveillance laboratories were in range  in December.

## 2016-05-10 DIAGNOSIS — D692 Other nonthrombocytopenic purpura: Secondary | ICD-10-CM | POA: Diagnosis not present

## 2016-05-10 DIAGNOSIS — Z85828 Personal history of other malignant neoplasm of skin: Secondary | ICD-10-CM | POA: Diagnosis not present

## 2016-05-10 DIAGNOSIS — D1801 Hemangioma of skin and subcutaneous tissue: Secondary | ICD-10-CM | POA: Diagnosis not present

## 2016-05-10 DIAGNOSIS — L570 Actinic keratosis: Secondary | ICD-10-CM | POA: Diagnosis not present

## 2016-05-10 DIAGNOSIS — L821 Other seborrheic keratosis: Secondary | ICD-10-CM | POA: Diagnosis not present

## 2016-05-15 ENCOUNTER — Telehealth: Payer: Self-pay | Admitting: Internal Medicine

## 2016-05-15 ENCOUNTER — Encounter: Payer: Self-pay | Admitting: Internal Medicine

## 2016-05-15 NOTE — Telephone Encounter (Signed)
Pt's medication refill was ordered from [employer]. Order number is [id number] and will arrive at our office in 7-10 business days. I called [phone]. Patient ID is [id number].

## 2016-05-16 DIAGNOSIS — E782 Mixed hyperlipidemia: Secondary | ICD-10-CM | POA: Insufficient documentation

## 2016-05-16 DIAGNOSIS — E559 Vitamin D deficiency, unspecified: Secondary | ICD-10-CM | POA: Diagnosis not present

## 2016-05-16 DIAGNOSIS — E113393 Type 2 diabetes mellitus with moderate nonproliferative diabetic retinopathy without macular edema, bilateral: Secondary | ICD-10-CM | POA: Diagnosis not present

## 2016-05-16 DIAGNOSIS — I480 Paroxysmal atrial fibrillation: Secondary | ICD-10-CM | POA: Diagnosis not present

## 2016-05-16 DIAGNOSIS — I2510 Atherosclerotic heart disease of native coronary artery without angina pectoris: Secondary | ICD-10-CM | POA: Diagnosis not present

## 2016-05-16 DIAGNOSIS — E1159 Type 2 diabetes mellitus with other circulatory complications: Secondary | ICD-10-CM | POA: Diagnosis not present

## 2016-05-18 ENCOUNTER — Telehealth: Payer: Self-pay | Admitting: *Deleted

## 2016-05-18 NOTE — Telephone Encounter (Signed)
Called patient to let him know that his tikosyn had arrived from the company and that I would place it at the front desk.

## 2016-06-13 ENCOUNTER — Encounter: Payer: Self-pay | Admitting: Internal Medicine

## 2016-06-13 DIAGNOSIS — E113393 Type 2 diabetes mellitus with moderate nonproliferative diabetic retinopathy without macular edema, bilateral: Secondary | ICD-10-CM | POA: Diagnosis not present

## 2016-06-13 DIAGNOSIS — H35043 Retinal micro-aneurysms, unspecified, bilateral: Secondary | ICD-10-CM | POA: Diagnosis not present

## 2016-06-13 DIAGNOSIS — H35372 Puckering of macula, left eye: Secondary | ICD-10-CM | POA: Diagnosis not present

## 2016-06-13 DIAGNOSIS — H43813 Vitreous degeneration, bilateral: Secondary | ICD-10-CM | POA: Diagnosis not present

## 2016-07-25 ENCOUNTER — Other Ambulatory Visit: Payer: Self-pay | Admitting: Internal Medicine

## 2016-07-26 ENCOUNTER — Encounter: Payer: Self-pay | Admitting: Internal Medicine

## 2016-08-08 ENCOUNTER — Ambulatory Visit (INDEPENDENT_AMBULATORY_CARE_PROVIDER_SITE_OTHER): Payer: Medicare Other | Admitting: *Deleted

## 2016-08-08 DIAGNOSIS — I255 Ischemic cardiomyopathy: Secondary | ICD-10-CM | POA: Diagnosis not present

## 2016-08-08 NOTE — Progress Notes (Signed)
Remote ICD transmission.   

## 2016-08-14 ENCOUNTER — Encounter: Payer: Self-pay | Admitting: Internal Medicine

## 2016-08-15 ENCOUNTER — Encounter: Payer: Self-pay | Admitting: Cardiology

## 2016-08-18 ENCOUNTER — Telehealth: Payer: Self-pay

## 2016-08-18 ENCOUNTER — Telehealth: Payer: Self-pay | Admitting: *Deleted

## 2016-08-18 NOTE — Telephone Encounter (Signed)
Patient called to schedule Device Clinic appointment to update ICD time for Daylight Savings.  He is agreeable to appointment on Monday, 08/21/16 at 11:00am.  Patient is appreciative and denies additional questions or concerns at this time.

## 2016-08-18 NOTE — Telephone Encounter (Signed)
The pt is advised that his [name] has arrived by mail and that he can come by the office to pick up at his convenience. He states that he will pick up today.

## 2016-08-21 ENCOUNTER — Ambulatory Visit (INDEPENDENT_AMBULATORY_CARE_PROVIDER_SITE_OTHER): Payer: Medicare Other | Admitting: *Deleted

## 2016-08-21 DIAGNOSIS — I255 Ischemic cardiomyopathy: Secondary | ICD-10-CM | POA: Diagnosis not present

## 2016-08-21 DIAGNOSIS — I480 Paroxysmal atrial fibrillation: Secondary | ICD-10-CM

## 2016-08-21 DIAGNOSIS — Z95810 Presence of automatic (implantable) cardiac defibrillator: Secondary | ICD-10-CM

## 2016-08-21 LAB — CUP PACEART REMOTE DEVICE CHECK
Battery Remaining Longevity: 83 mo
Battery Voltage: 3 V
Brady Statistic AP VP Percent: 87.21 %
Brady Statistic AP VS Percent: 0.78 %
Brady Statistic AS VP Percent: 11.27 %
Brady Statistic AS VS Percent: 0.74 %
Brady Statistic RA Percent Paced: 79.11 %
Brady Statistic RV Percent Paced: 88.59 %
Date Time Interrogation Session: 20180710104615
HighPow Impedance: 39 Ohm
HighPow Impedance: 50 Ohm
Implantable Lead Implant Date: 20040309
Implantable Lead Implant Date: 20040309
Implantable Lead Location: 753859
Implantable Lead Location: 753860
Implantable Lead Model: 158
Implantable Lead Model: 5076
Implantable Lead Serial Number: 115061
Implantable Pulse Generator Implant Date: 20161007
Lead Channel Impedance Value: 399 Ohm
Lead Channel Impedance Value: 4047 Ohm
Lead Channel Impedance Value: 4047 Ohm
Lead Channel Impedance Value: 4047 Ohm
Lead Channel Impedance Value: 4047 Ohm
Lead Channel Impedance Value: 4047 Ohm
Lead Channel Impedance Value: 4047 Ohm
Lead Channel Impedance Value: 4047 Ohm
Lead Channel Impedance Value: 4047 Ohm
Lead Channel Impedance Value: 4047 Ohm
Lead Channel Impedance Value: 4047 Ohm
Lead Channel Impedance Value: 475 Ohm
Lead Channel Impedance Value: 513 Ohm
Lead Channel Pacing Threshold Amplitude: 0.5 V
Lead Channel Pacing Threshold Amplitude: 0.625 V
Lead Channel Pacing Threshold Pulse Width: 0.4 ms
Lead Channel Pacing Threshold Pulse Width: 0.4 ms
Lead Channel Sensing Intrinsic Amplitude: 1.625 mV
Lead Channel Sensing Intrinsic Amplitude: 1.625 mV
Lead Channel Sensing Intrinsic Amplitude: 3.25 mV
Lead Channel Sensing Intrinsic Amplitude: 3.25 mV
Lead Channel Setting Pacing Amplitude: 2 V
Lead Channel Setting Pacing Amplitude: 2.5 V
Lead Channel Setting Pacing Pulse Width: 0.4 ms
Lead Channel Setting Sensing Sensitivity: 0.3 mV

## 2016-08-21 LAB — CUP PACEART INCLINIC DEVICE CHECK
Battery Remaining Longevity: 81 mo
Battery Voltage: 2.96 V
Brady Statistic AP VP Percent: 86.63 %
Brady Statistic AP VS Percent: 0.84 %
Brady Statistic AS VP Percent: 11.16 %
Brady Statistic AS VS Percent: 1.37 %
Brady Statistic RA Percent Paced: 77.52 %
Brady Statistic RV Percent Paced: 87.09 %
Date Time Interrogation Session: 20180723135323
HighPow Impedance: 43 Ohm
HighPow Impedance: 58 Ohm
Implantable Lead Implant Date: 20040309
Implantable Lead Implant Date: 20040309
Implantable Lead Location: 753859
Implantable Lead Location: 753860
Implantable Lead Model: 158
Implantable Lead Model: 5076
Implantable Lead Serial Number: 115061
Implantable Pulse Generator Implant Date: 20161007
Lead Channel Impedance Value: 4047 Ohm
Lead Channel Impedance Value: 4047 Ohm
Lead Channel Impedance Value: 4047 Ohm
Lead Channel Impedance Value: 4047 Ohm
Lead Channel Impedance Value: 4047 Ohm
Lead Channel Impedance Value: 4047 Ohm
Lead Channel Impedance Value: 4047 Ohm
Lead Channel Impedance Value: 4047 Ohm
Lead Channel Impedance Value: 4047 Ohm
Lead Channel Impedance Value: 4047 Ohm
Lead Channel Impedance Value: 418 Ohm
Lead Channel Impedance Value: 456 Ohm
Lead Channel Impedance Value: 456 Ohm
Lead Channel Sensing Intrinsic Amplitude: 1.75 mV
Lead Channel Sensing Intrinsic Amplitude: 6.125 mV
Lead Channel Setting Pacing Amplitude: 2 V
Lead Channel Setting Pacing Amplitude: 2.5 V
Lead Channel Setting Pacing Pulse Width: 0.4 ms
Lead Channel Setting Sensing Sensitivity: 0.3 mV

## 2016-08-21 NOTE — Progress Notes (Signed)
ICD interrogation in clinic, no threshold testing performed. Sensing tests performed as R-wave amplitude trend appeared to be trending down; R-waves stable today at 6.41mV. 22 monitored NSVT episodes, longest ~15bts. 7 treated AT/AF episodes, 14.3% success for pace-terminated episodes, 33.3% success for shock-terminated episodes (per device, but appears 100% success per EGMs). Patient reports that after much thought, he would like CV off for AT/AF. CV for AT/AF turned off (ok per WC), but ATP remains enabled. ICD time updated. Max RA/RV lead impedance alerts lowered to 2000ohms per protocol. Patient educated about shock protocol. ROV with SK on 10/31/16.

## 2016-08-26 ENCOUNTER — Encounter: Payer: Self-pay | Admitting: Internal Medicine

## 2016-08-30 ENCOUNTER — Ambulatory Visit (INDEPENDENT_AMBULATORY_CARE_PROVIDER_SITE_OTHER): Payer: Self-pay | Admitting: *Deleted

## 2016-08-30 ENCOUNTER — Encounter: Payer: Self-pay | Admitting: *Deleted

## 2016-08-30 DIAGNOSIS — Z95810 Presence of automatic (implantable) cardiac defibrillator: Secondary | ICD-10-CM

## 2016-08-30 NOTE — Progress Notes (Signed)
Remote ICD transmission.   

## 2016-08-31 ENCOUNTER — Encounter: Payer: Self-pay | Admitting: Cardiology

## 2016-09-11 ENCOUNTER — Other Ambulatory Visit: Payer: Self-pay | Admitting: Internal Medicine

## 2016-09-25 LAB — CUP PACEART REMOTE DEVICE CHECK
Battery Remaining Longevity: 81 mo
Battery Voltage: 2.99 V
Brady Statistic AP VP Percent: 89.87 %
Brady Statistic AP VS Percent: 0.65 %
Brady Statistic AS VP Percent: 9.15 %
Brady Statistic AS VS Percent: 0.33 %
Brady Statistic RA Percent Paced: 82.16 %
Brady Statistic RV Percent Paced: 90.3 %
Date Time Interrogation Session: 20180801041804
HighPow Impedance: 39 Ohm
HighPow Impedance: 52 Ohm
Implantable Lead Implant Date: 20040309
Implantable Lead Implant Date: 20040309
Implantable Lead Location: 753859
Implantable Lead Location: 753860
Implantable Lead Model: 158
Implantable Lead Model: 5076
Implantable Lead Serial Number: 115061
Implantable Pulse Generator Implant Date: 20161007
Lead Channel Impedance Value: 399 Ohm
Lead Channel Impedance Value: 4047 Ohm
Lead Channel Impedance Value: 4047 Ohm
Lead Channel Impedance Value: 4047 Ohm
Lead Channel Impedance Value: 4047 Ohm
Lead Channel Impedance Value: 4047 Ohm
Lead Channel Impedance Value: 4047 Ohm
Lead Channel Impedance Value: 4047 Ohm
Lead Channel Impedance Value: 4047 Ohm
Lead Channel Impedance Value: 4047 Ohm
Lead Channel Impedance Value: 4047 Ohm
Lead Channel Impedance Value: 418 Ohm
Lead Channel Impedance Value: 456 Ohm
Lead Channel Pacing Threshold Amplitude: 0.5 V
Lead Channel Pacing Threshold Amplitude: 0.625 V
Lead Channel Pacing Threshold Pulse Width: 0.4 ms
Lead Channel Pacing Threshold Pulse Width: 0.4 ms
Lead Channel Sensing Intrinsic Amplitude: 2 mV
Lead Channel Sensing Intrinsic Amplitude: 2 mV
Lead Channel Sensing Intrinsic Amplitude: 3.375 mV
Lead Channel Sensing Intrinsic Amplitude: 3.375 mV
Lead Channel Setting Pacing Amplitude: 2 V
Lead Channel Setting Pacing Amplitude: 2.5 V
Lead Channel Setting Pacing Pulse Width: 0.4 ms
Lead Channel Setting Sensing Sensitivity: 0.3 mV

## 2016-10-24 ENCOUNTER — Encounter: Payer: Self-pay | Admitting: Internal Medicine

## 2016-10-31 ENCOUNTER — Encounter: Payer: Medicare Other | Admitting: Internal Medicine

## 2016-11-07 ENCOUNTER — Ambulatory Visit (INDEPENDENT_AMBULATORY_CARE_PROVIDER_SITE_OTHER): Payer: Medicare Other | Admitting: *Deleted

## 2016-11-07 ENCOUNTER — Encounter: Payer: Self-pay | Admitting: Internal Medicine

## 2016-11-07 DIAGNOSIS — I255 Ischemic cardiomyopathy: Secondary | ICD-10-CM | POA: Diagnosis not present

## 2016-11-08 LAB — CUP PACEART REMOTE DEVICE CHECK
Battery Remaining Longevity: 78 mo
Battery Voltage: 2.99 V
Brady Statistic AP VP Percent: 72.7 %
Brady Statistic AP VS Percent: 0.45 %
Brady Statistic AS VP Percent: 21.5 %
Brady Statistic AS VS Percent: 5.35 %
Brady Statistic RA Percent Paced: 67.86 %
Brady Statistic RV Percent Paced: 89.41 %
Date Time Interrogation Session: 20181009041604
HighPow Impedance: 40 Ohm
HighPow Impedance: 55 Ohm
Implantable Lead Implant Date: 20040309
Implantable Lead Implant Date: 20040309
Implantable Lead Location: 753859
Implantable Lead Location: 753860
Implantable Lead Model: 158
Implantable Lead Model: 5076
Implantable Lead Serial Number: 115061
Implantable Pulse Generator Implant Date: 20161007
Lead Channel Impedance Value: 399 Ohm
Lead Channel Impedance Value: 4047 Ohm
Lead Channel Impedance Value: 4047 Ohm
Lead Channel Impedance Value: 4047 Ohm
Lead Channel Impedance Value: 4047 Ohm
Lead Channel Impedance Value: 4047 Ohm
Lead Channel Impedance Value: 4047 Ohm
Lead Channel Impedance Value: 4047 Ohm
Lead Channel Impedance Value: 4047 Ohm
Lead Channel Impedance Value: 4047 Ohm
Lead Channel Impedance Value: 4047 Ohm
Lead Channel Impedance Value: 456 Ohm
Lead Channel Impedance Value: 475 Ohm
Lead Channel Pacing Threshold Amplitude: 0.5 V
Lead Channel Pacing Threshold Amplitude: 0.625 V
Lead Channel Pacing Threshold Pulse Width: 0.4 ms
Lead Channel Pacing Threshold Pulse Width: 0.4 ms
Lead Channel Sensing Intrinsic Amplitude: 1.875 mV
Lead Channel Sensing Intrinsic Amplitude: 1.875 mV
Lead Channel Sensing Intrinsic Amplitude: 3.625 mV
Lead Channel Sensing Intrinsic Amplitude: 3.625 mV
Lead Channel Setting Pacing Amplitude: 2 V
Lead Channel Setting Pacing Amplitude: 2.5 V
Lead Channel Setting Pacing Pulse Width: 0.4 ms
Lead Channel Setting Sensing Sensitivity: 0.3 mV

## 2016-11-08 NOTE — Progress Notes (Signed)
Remote ICD transmission.   

## 2016-11-09 DIAGNOSIS — E1159 Type 2 diabetes mellitus with other circulatory complications: Secondary | ICD-10-CM | POA: Diagnosis not present

## 2016-11-09 DIAGNOSIS — E782 Mixed hyperlipidemia: Secondary | ICD-10-CM | POA: Diagnosis not present

## 2016-11-09 DIAGNOSIS — I480 Paroxysmal atrial fibrillation: Secondary | ICD-10-CM | POA: Diagnosis not present

## 2016-11-10 ENCOUNTER — Encounter: Payer: Self-pay | Admitting: Cardiology

## 2016-11-13 DIAGNOSIS — E1159 Type 2 diabetes mellitus with other circulatory complications: Secondary | ICD-10-CM | POA: Diagnosis not present

## 2016-11-13 DIAGNOSIS — I480 Paroxysmal atrial fibrillation: Secondary | ICD-10-CM | POA: Diagnosis not present

## 2016-11-13 DIAGNOSIS — E113393 Type 2 diabetes mellitus with moderate nonproliferative diabetic retinopathy without macular edema, bilateral: Secondary | ICD-10-CM | POA: Diagnosis not present

## 2016-11-13 DIAGNOSIS — E782 Mixed hyperlipidemia: Secondary | ICD-10-CM | POA: Diagnosis not present

## 2016-11-13 DIAGNOSIS — E559 Vitamin D deficiency, unspecified: Secondary | ICD-10-CM | POA: Diagnosis not present

## 2016-11-13 DIAGNOSIS — I2510 Atherosclerotic heart disease of native coronary artery without angina pectoris: Secondary | ICD-10-CM | POA: Diagnosis not present

## 2016-11-17 DIAGNOSIS — Z23 Encounter for immunization: Secondary | ICD-10-CM | POA: Diagnosis not present

## 2016-11-20 ENCOUNTER — Telehealth: Payer: Self-pay

## 2016-11-20 NOTE — Telephone Encounter (Signed)
LVM to schedule patient with industry in the [place] on thursday afternoon preferable to pair him with the Medtronic assistant device.

## 2016-11-21 ENCOUNTER — Telehealth: Payer: Self-pay | Admitting: Internal Medicine

## 2016-11-21 NOTE — Telephone Encounter (Signed)
New message    Pt is returning call to [place].

## 2016-11-21 NOTE — Telephone Encounter (Signed)
Spoke with patient that who confirms his appt with SK on 10/26 - will contact industry for pairing of inpatient assistant device.

## 2016-11-24 ENCOUNTER — Encounter: Payer: Self-pay | Admitting: Internal Medicine

## 2016-11-24 ENCOUNTER — Ambulatory Visit (INDEPENDENT_AMBULATORY_CARE_PROVIDER_SITE_OTHER): Payer: Medicare Other | Admitting: Internal Medicine

## 2016-11-24 VITALS — BP 98/46 | HR 82 | Ht 74.0 in | Wt 213.0 lb

## 2016-11-24 DIAGNOSIS — I5022 Chronic systolic (congestive) heart failure: Secondary | ICD-10-CM | POA: Diagnosis not present

## 2016-11-24 DIAGNOSIS — I255 Ischemic cardiomyopathy: Secondary | ICD-10-CM | POA: Diagnosis not present

## 2016-11-24 DIAGNOSIS — Z79899 Other long term (current) drug therapy: Secondary | ICD-10-CM

## 2016-11-24 DIAGNOSIS — Z4502 Encounter for adjustment and management of automatic implantable cardiac defibrillator: Secondary | ICD-10-CM

## 2016-11-24 DIAGNOSIS — I2589 Other forms of chronic ischemic heart disease: Secondary | ICD-10-CM

## 2016-11-24 DIAGNOSIS — I480 Paroxysmal atrial fibrillation: Secondary | ICD-10-CM

## 2016-11-24 LAB — CUP PACEART INCLINIC DEVICE CHECK
Date Time Interrogation Session: 20181026164111
Implantable Lead Implant Date: 20040309
Implantable Lead Implant Date: 20040309
Implantable Lead Location: 753859
Implantable Lead Location: 753860
Implantable Lead Model: 158
Implantable Lead Model: 5076
Implantable Lead Serial Number: 115061
Implantable Pulse Generator Implant Date: 20161007
Lead Channel Setting Pacing Amplitude: 2 V
Lead Channel Setting Pacing Amplitude: 2.5 V
Lead Channel Setting Pacing Pulse Width: 0.4 ms
Lead Channel Setting Sensing Sensitivity: 0.3 mV

## 2016-11-24 MED ORDER — RANOLAZINE ER 500 MG PO TB12: 500.0000 mg | ORAL_TABLET | Freq: Two times a day (BID) | ORAL | 3 refills | Status: DC

## 2016-11-24 NOTE — Patient Instructions (Addendum)
Medication Instructions:  Your physician has recommended you make the following change in your medication: 1. START Ranexa 500 mg twice daily  -- If you need a refill on your cardiac medications before your next appointment, please call your pharmacy. --  Labwork: Today: BMET & Magnesium  Testing/Procedures: None ordered  Follow-Up: Your physician recommends that you schedule a follow-up appointment in: 3 months with Chanetta Marshall, NP.   Thank you for choosing [facility]!!     Any Other Special Instructions Will Be Listed Below (If Applicable).

## 2016-11-24 NOTE — Progress Notes (Signed)
Patient Care Team: [name], MD as PCP - General (Family Medicine)   HPI  [name] is a 83 y.o. male Seen in followup for polymorphic ventricular tachycardia for which he has an ICD and atrial fibrillation.  He was seen November 13 because of 2 episodes of polymorphic ventricular tachycardia occurring in the preceding few weeks after having been quiet for months and months. There was no clear inciting event. Beta blocker therapy was gently uptitrated.  No intercurrent symptomatic VT.    He underwent ICD generator replacement 9/16  He also has a history of coronary disease with prior CABG mitral valve repair both of which were done at [facility] clinic at that time. he also had a PFO closure.  He underwent catheterization 6/14 demonstrating patency of the LIMA to the LAD and RIMA to his obtuse marginal. Ejection fraction is 35-40% 2/17  Echo EF 30%   He Is on dofetilide for his arrhythmias when he was seen last year complaints of increasing exercise intolerance and chest discomfort.   He has frequent PVCs  We had set up automatic DCCV via ICD for recurrent atrial arrhythmia   He has been shocked twice with success.   He asked that he turned off because of frequent and unexpected shocks  He remains not on anticoagulant been averse to such things.      He comes in today for follow-up. He also would like to have the activator to be able to shock himself. [employer] is going to great lengths to find one. There are very few left  in the world.   Date Cr K Mg  9/16  0.99 4.2 2.1  12/17  1.08 4.6 2.1       Past Medical History:  Diagnosis Date  . [place] artery [place]    [place] [facility] [facility]  . Diabetes mellitus   . Endocarditis, valve unspecified    Mitral  . History of migraine headaches   . History of seasonal allergies   . ICD (implantable cardiac defibrillator) in place   . PAF (paroxysmal atrial fibrillation) ([place])   . Pleural effusion   . PVC's  (premature ventricular contractions)   . Raynaud's disease   . Ventricular tachycardia, polymorphic ([place])    status post ICD implantation    Past Surgical History:  Procedure Laterality Date  . CARDIAC CATHETERIZATION  04/03/2007   EF 40%  . CARDIAC CATHETERIZATION  02/06/2006   EF 45%. ANTERIOR HYPOKINESIS  . CARDIAC CATHETERIZATION  05/29/2000   EF 50%. MILD ANTERIOR HYPOKINESIS  . CARDIAC CATHETERIZATION  10/25/1999   EF 50%. SEVERE MITRAL REGURGITATION  . CARDIAC CATHETERIZATION  01/06/2003   EF 50%  . CARDIAC DEFIBRILLATOR PLACEMENT    . CORONARY ARTERY BYPASS GRAFT  2001   2 VESSEL CABG AND MITRAL VALVE REPAIR. HE HAD A LIMA GRAFT TO THE LAD AND RADIAL ARTERY  GRAFT TO THE OBTUSE MARGINAL  OF LEFT CIRCUMFLEX  . EP IMPLANTABLE DEVICE N/A 11/06/2014   Procedure: ICD Generator Changeout;  Surgeon: Deboraha Sprang, MD;  Location: [place] CV LAB;  Service: Cardiovascular;  Laterality: N/A;  . HEMORROIDECTOMY    . KNEE ARTHROSCOPY     RIGHT KNEE  . LEFT HEART CATHETERIZATION WITH CORONARY ANGIOGRAM N/A 07/04/2012   Procedure: LEFT HEART CATHETERIZATION WITH CORONARY ANGIOGRAM;  Surgeon: Sherren Mocha, MD;  Location: [facility] CATH LAB;  Service: Cardiovascular;  Laterality: N/A;  . MITRAL VALVE REPLACEMENT    . TRANSTHORACIC ECHOCARDIOGRAM  04/02/2007  EF 35%    Current Outpatient Prescriptions  Medication Sig Dispense Refill  . atorvastatin (LIPITOR) 80 MG tablet Take 1 tablet by mouth Daily.    . Cholecalciferol (VITAMIN D-3) 5000 UNITS TABS Take 5,000-10,000 Units by mouth daily. EVERY OTHER DAY. Take 1 tablet four times a WEEK and then take 2 tablets three times a WEEK.    Marland Kitchen EPIPEN 2-PAK 0.3 MG/0.3ML SOAJ injection USE ONLY IN EMERGENCY    . fluticasone (FLONASE) 50 MCG/ACT nasal spray Place 1 spray into the nose daily as needed for rhinitis or allergies.    . Magnesium 250 MG TABS Take 250 mg by mouth daily.     . metoprolol succinate (TOPROL-XL) 50 MG 24 hr tablet TAKE ONE TABLET BY  MOUTH DAILY IMMEDIATELY FOLLOWING A A MEAL 90 tablet 1  . Multiple Vitamin (MULTIVITAMIN WITH MINERALS) TABS Take 1 tablet by mouth daily.    . Multiple Vitamins-Minerals (PRESERVISION AREDS 2) CAPS Take 2 capsules by mouth daily.    . nateglinide (STARLIX) 120 MG tablet Take 1 tablet by mouth Twice daily.    . pioglitazone (ACTOS) 45 MG tablet Take 45 mg by mouth daily.      . sitaGLIPtin (JANUVIA) 100 MG tablet Take 100 mg by mouth daily.      Marland Kitchen [name] 500 MCG capsule Take 1 capsule (500 mcg total) by mouth 2 (two) times daily. 180 capsule 3  . ranolazine (RANEXA) 500 MG 12 hr tablet Take 1 tablet (500 mg total) by mouth 2 (two) times daily. 60 tablet 3   No current facility-administered medications for this visit.     Allergies  Allergen Reactions  . Latex     REDNESS AT REACTION SITE.  Marland Kitchen Other     BEE STINGS   . Sotalol     "BLUE TOES"- cut his circulation off  . Warfarin Sodium     REACTION: migraine headaches/vision impariment    Review of Systems negative except from HPI and PMH  Physical Exam BP (!) 98/46   Pulse 82   Ht 6\' 2"  (1.88 m)   Wt 213 lb (96.6 kg)   SpO2 98%   BMI 27.35 kg/m  Well developed and nourished in no acute distress HENT normal Neck supple with JVP-flat Carotids brisk and full without bruits Clear IRRRegular rate and rhythm, no murmurs or gallops Abd-soft with active BS without hepatomegaly No Clubbing cyanosis edema Skin-warm and dry A & Oriented  Grossly normal sensory and motor function  ECG Personally reviewed  P-synchronous/ AV  Pacing   Intervals 20/19/50       Assessment and  Plan  Ischemic heart disease with prior bypass  Ischemic cardiomyopathy ejection 35-40%  Congestive heart failure-chronic-systolic  Implantable defibrillator-Medtronic  The patient's device was interrogated.  The information was reviewed.  Device was reprogrammed as below  PVCs  Atrial fibrillation   Dofetilide therapy   Ventricular  tachycardia nonsustained     No intercurrent Ventricular tachycardia  freq PVC  Recurrent afib--he asked his dofetilide had become exhausted in its work. I explained that his atrium is continuing to remodel adversely. We have added ranolazine 500 mg twice daily to see if we can augment the antiarrhythmic effects of the dofetilide.  In addition, a great deal of time was spent instructing him on the use of the patient activator for cardioversion of his atrial fibrillation.  He reminded me that I had told him I talked to [facility] about ablation of his PVCs and  his atrial fibrillation. I had failed to do that.  Surveillance laboratories were obtained for his dofetilide.  We also discussed watching him. He reminded me that he had mitral valve surgery at the [facility] clinic. When I talked to Dr. Boyd Kerbs, we will try and get her to review the operative note to see whether his left atrial appendage was excluded surgically More than 50% of 45 min was spent in counseling related to the above  We will also shorten his AV delay as he is having significant ventricular pacing and to improve AV synchrony. This may also decrease his PVCs.  Dofetilide surveillance laboratories were in range in December.

## 2016-11-25 LAB — BASIC METABOLIC PANEL
BUN/Creatinine Ratio: 13 (ref 10–24)
BUN: 13 mg/dL (ref 8–27)
CO2: 26 mmol/L (ref 20–29)
Calcium: 9.5 mg/dL (ref 8.6–10.2)
Chloride: 99 mmol/L (ref 96–106)
Creatinine, Ser: 1.04 mg/dL (ref 0.76–1.27)
GFR calc Af Amer: 76 mL/min/{1.73_m2} (ref >59)
GFR calc non Af Amer: 66 mL/min/{1.73_m2} (ref >59)
Glucose: 121 mg/dL — ABNORMAL HIGH (ref 65–99)
Potassium: 4.9 mmol/L (ref 3.5–5.2)
Sodium: 138 mmol/L (ref 134–144)

## 2016-11-25 LAB — MAGNESIUM: Magnesium: 2.2 mg/dL (ref 1.6–2.3)

## 2016-11-27 ENCOUNTER — Telehealth: Payer: Self-pay

## 2016-11-27 NOTE — Telephone Encounter (Signed)
Pt is aware and agreeable to normal results. Patient informed me that PCP is following blood sugar

## 2016-11-29 ENCOUNTER — Telehealth: Payer: Self-pay

## 2016-11-29 ENCOUNTER — Encounter: Payer: Self-pay | Admitting: Internal Medicine

## 2016-11-29 NOTE — Telephone Encounter (Signed)
**Note De-identified  Obfuscation**  **Note De-Identified  Obfuscation** I have order the pts [name] through [employer] with Gwyndolyn Saxon. Order [id number].  Per Gwyndolyn Saxon it will take 7 to 10 bunsiness days to receive the pts [name]. He states that the pt will need to re-apply by 12/31 for pt assistance with [name].

## 2016-12-04 ENCOUNTER — Other Ambulatory Visit: Payer: Self-pay

## 2016-12-04 MED ORDER — TIKOSYN 500 MCG PO CAPS: 500.0000 ug | ORAL_CAPSULE | Freq: Two times a day (BID) | ORAL | 3 refills | Status: DC

## 2016-12-04 NOTE — Telephone Encounter (Addendum)
**Note De-Identified  Obfuscation** The pt is awre that his Phyllis Ginger is here at the office and that he can pick up at his convenience.  Also, I placed a [name] [employer]) pt assistance application in bag with his [name] for him to complete.  I have completed the provider part of the application and printed a RX for [name]. I have placed both in Dr Olin Pia mail bin awaiting his signature.

## 2016-12-07 ENCOUNTER — Encounter: Payer: Self-pay | Admitting: Internal Medicine

## 2016-12-08 DIAGNOSIS — I480 Paroxysmal atrial fibrillation: Secondary | ICD-10-CM | POA: Diagnosis not present

## 2016-12-08 DIAGNOSIS — I509 Heart failure, unspecified: Secondary | ICD-10-CM | POA: Diagnosis not present

## 2016-12-08 DIAGNOSIS — I2510 Atherosclerotic heart disease of native coronary artery without angina pectoris: Secondary | ICD-10-CM | POA: Diagnosis not present

## 2016-12-08 DIAGNOSIS — G43909 Migraine, unspecified, not intractable, without status migrainosus: Secondary | ICD-10-CM | POA: Diagnosis not present

## 2016-12-08 DIAGNOSIS — J449 Chronic obstructive pulmonary disease, unspecified: Secondary | ICD-10-CM | POA: Diagnosis not present

## 2016-12-08 DIAGNOSIS — I472 Ventricular tachycardia: Secondary | ICD-10-CM | POA: Diagnosis not present

## 2016-12-08 DIAGNOSIS — Z0000 Encounter for general adult medical examination without abnormal findings: Secondary | ICD-10-CM | POA: Diagnosis not present

## 2016-12-08 DIAGNOSIS — E113599 Type 2 diabetes mellitus with proliferative diabetic retinopathy without macular edema, unspecified eye: Secondary | ICD-10-CM | POA: Diagnosis not present

## 2016-12-08 DIAGNOSIS — E7800 Pure hypercholesterolemia, unspecified: Secondary | ICD-10-CM | POA: Diagnosis not present

## 2016-12-08 DIAGNOSIS — Z95810 Presence of automatic (implantable) cardiac defibrillator: Secondary | ICD-10-CM | POA: Diagnosis not present

## 2016-12-18 ENCOUNTER — Other Ambulatory Visit: Payer: Self-pay

## 2016-12-18 MED ORDER — TIKOSYN 500 MCG PO CAPS: 500.0000 ug | ORAL_CAPSULE | Freq: Two times a day (BID) | ORAL | 3 refills | Status: DC

## 2016-12-18 NOTE — Telephone Encounter (Signed)
The pt mailed his completed [employer] application to the office. Dr Caryl Comes has signed the provider part of the application and the [place]. I have faxed all to [employer] pt assistance program.

## 2016-12-19 ENCOUNTER — Encounter: Payer: Self-pay | Admitting: Internal Medicine

## 2016-12-22 ENCOUNTER — Encounter: Payer: Self-pay | Admitting: Internal Medicine

## 2016-12-27 ENCOUNTER — Encounter: Payer: Self-pay | Admitting: Cardiovascular Disease

## 2016-12-27 ENCOUNTER — Ambulatory Visit (INDEPENDENT_AMBULATORY_CARE_PROVIDER_SITE_OTHER): Payer: Medicare Other | Admitting: Cardiovascular Disease

## 2016-12-27 VITALS — BP 114/76 | HR 73 | Ht 74.0 in | Wt 213.8 lb

## 2016-12-27 DIAGNOSIS — I209 Angina pectoris, unspecified: Secondary | ICD-10-CM

## 2016-12-27 DIAGNOSIS — I255 Ischemic cardiomyopathy: Secondary | ICD-10-CM | POA: Diagnosis not present

## 2016-12-27 DIAGNOSIS — I25119 Atherosclerotic heart disease of native coronary artery with unspecified angina pectoris: Secondary | ICD-10-CM

## 2016-12-27 NOTE — Progress Notes (Signed)
Cardiology Office Note Date:  12/27/2016   ID:  [name], DOB 11/13/1931, MRN [id number]  PCP:  [name], MD  Cardiologist:  Sherren Mocha, MD    Chief Complaint  Patient presents with  . Shortness of Breath     History of Present Illness: [name] is a 83 y.o. male who presents for evaluation of fatigue, shortness of breath, and generalized exercise intolerance.   Patient has a history of coronary artery disease with remote CABG and mitral valve repair.  He underwent heart catheterization in 2014 demonstrating two-vessel coronary artery disease with severe stenosis of the proximal LAD and continued patency of the LIMA to LAD graft, moderate stenosis of the proximal circumflex with heavy calcification and continued patency of the free RIMA graft to obtuse marginal.  Pressure wire analysis of the left circumflex was negative at that time.  He is having problems recently with exertion. States he feels lightheaded and weak with exertion. Also complains of diaphoresis with activity. He had started ranexa and read that dizziness is a side effect and he stopped this. He then had what he perceived as symptomatic atrial fibrillation, with further dizziness and weakness. He denies chest pain or tightness. Complains of exertional dyspnea. He doesn't appreciate much change but his wife thinks he's 'slowed down.'  He has symptoms with low level activity such as taking out the trash or walking short distances.  He is really not able to do much because of weakness, shortness of breath, nausea with activity, and lightheadedness with activity.  He has started back on Ranexa and feels like his atrial fibrillation is better controlled when he is on both Ranexa and dofetilide.  He has not been able to tolerate anticoagulant drugs.  Past Medical History:  Diagnosis Date  . Coronary artery disease    [facility]  . Diabetes mellitus   . Endocarditis, valve  unspecified    Mitral  . History of migraine headaches   . History of seasonal allergies   . ICD (implantable cardiac defibrillator) in place   . PAF (paroxysmal atrial fibrillation) ([place])    NO LAA occlusion at the time of surgery  [name] 2018  . Pleural effusion   . PVC's (premature ventricular contractions)   . Raynaud's disease   . Ventricular tachycardia, polymorphic ([place])    status post ICD implantation    Past Surgical History:  Procedure Laterality Date  . CARDIAC CATHETERIZATION  04/03/2007   EF 40%  . CARDIAC CATHETERIZATION  02/06/2006   EF 45%. ANTERIOR HYPOKINESIS  . CARDIAC CATHETERIZATION  05/29/2000   EF 50%. MILD ANTERIOR HYPOKINESIS  . CARDIAC CATHETERIZATION  10/25/1999   EF 50%. SEVERE MITRAL REGURGITATION  . CARDIAC CATHETERIZATION  01/06/2003   EF 50%  . CARDIAC DEFIBRILLATOR PLACEMENT    . CORONARY ARTERY BYPASS GRAFT  2001   2 VESSEL CABG AND MITRAL VALVE REPAIR. HE HAD A LIMA GRAFT TO THE LAD AND RADIAL ARTERY  GRAFT TO THE OBTUSE MARGINAL  OF LEFT CIRCUMFLEX  . EP IMPLANTABLE DEVICE N/A 11/06/2014   Procedure: ICD Generator Changeout;  Surgeon: Deboraha Sprang, MD;  Location: [facility];  Service: Cardiovascular;  Laterality: N/A;  . HEMORROIDECTOMY    . KNEE ARTHROSCOPY     RIGHT KNEE  . LEFT HEART CATHETERIZATION WITH CORONARY ANGIOGRAM N/A 07/04/2012   Procedure: LEFT HEART CATHETERIZATION WITH CORONARY ANGIOGRAM;  Surgeon: Sherren Mocha, MD;  Location: [facility] CATH [facility];  Service:  Cardiovascular;  Laterality: N/A;  . MITRAL VALVE REPLACEMENT     No LAA occlusion at the time of surgery  [facility]ie [facility] report 2018  . TRANSTHORACIC ECHOCARDIOGRAM  04/02/2007   EF 35%    Current Outpatient Medications  Medication Sig Dispense Refill  . aspirin 81 MG chewable tablet Chew 81 mg by mouth daily.    Marland Kitchen atorvastatin (LIPITOR) 80 MG tablet Take 1 tablet by mouth Daily.    . Cholecalciferol (VITAMIN D-3) 5000 UNITS TABS Take 5,000-10,000 Units by mouth as directed.  EVERY OTHER DAY. Take 1 tablet four times a WEEK and then take 2 tablets three times a WEEK.    Marland Kitchen EPIPEN 2-PAK 0.3 MG/0.3ML SOAJ injection Inject 0.3 mg into the muscle as directed. USE ONLY IN EMERGENCY    . fluticasone (FLONASE) 50 MCG/ACT nasal spray Place 1 spray into the nose daily as needed for rhinitis or allergies.    . Magnesium 250 MG TABS Take 250 mg by mouth daily.     . metoprolol succinate (TOPROL-XL) 50 MG 24 hr tablet TAKE ONE TABLET BY MOUTH DAILY IMMEDIATELY FOLLOWING A A MEAL 90 tablet 1  . Multiple Vitamin (MULTIVITAMIN WITH MINERALS) TABS Take 1 tablet by mouth daily.    . Multiple Vitamins-Minerals (PRESERVISION AREDS 2) CAPS Take 2 capsules by mouth daily.    . nateglinide (STARLIX) 120 MG tablet Take 1 tablet by mouth Twice daily.    . pioglitazone (ACTOS) 45 MG tablet Take 45 mg by mouth daily.      . ranolazine (RANEXA) 500 MG 12 hr tablet Take 1 tablet (500 mg total) by mouth 2 (two) times daily. 60 tablet 3  . sitaGLIPtin (JANUVIA) 100 MG tablet Take 100 mg by mouth daily.      Marland Kitchen [name] 500 MCG capsule Take 1 capsule (500 mcg total) 2 (two) times daily by mouth. 180 capsule 3   No current facility-administered medications for this visit.     Allergies:   Latex; Metformin; Rivaroxaban; Other; Sotalol; and Warfarin sodium   Social History:  The patient  reports that he quit smoking about 33 years ago. he has never used smokeless tobacco. He reports that he drinks alcohol. He reports that he does not use drugs.   Family History:  The patient's or  family history includes Cancer in his other and sister; Diabetes in his other; Emphysema in his father; Heart disease in his father, paternal grandfather, and paternal grandmother; Stroke in his mother.    ROS:  Please see the history of present illness.  All other systems are reviewed and negative.    PHYSICAL EXAM: VS:  BP 114/76   Pulse 73   Ht 6\' 2"  (1.88 m)   Wt 213 lb 12.8 oz (97 kg)   BMI 27.45 kg/m  , BMI  Body mass index is 27.45 kg/m. GEN: Well nourished, well developed, in no acute distress  HEENT: normal  Neck: no JVD, no masses. No carotid bruits Cardiac: RRR without murmur or gallop                Respiratory:  clear to auscultation bilaterally, normal work of breathing GI: soft, nontender, nondistended, + BS MS: no deformity or atrophy  Ext: no pretibial edema, pedal pulses 2+= bilaterally Skin: warm and dry, no rash Neuro:  Strength and sensation are intact Psych: euthymic mood, full affect  EKG:  EKG is ordered today. The ekg ordered today shows electronic ventricular pacemaker 73 bpm  Recent Labs:  11/24/2016: BUN 13; Creatinine, Ser 1.04; Magnesium 2.2; Potassium 4.9; Sodium 138   Lipid Panel     Component Value Date/Time   CHOL  04/02/2007 0603    195        ATP III CLASSIFICATION:  <200     mg/dL   Desirable  200-239  mg/dL   Borderline High  >=240    mg/dL   High   TRIG 231 (H) 04/02/2007 0603   HDL 47 04/02/2007 0603   CHOLHDL 4.1 04/02/2007 0603   VLDL 46 (H) 04/02/2007 0603   LDLCALC (H) 04/02/2007 0603    102        Total Cholesterol/HDL:CHD Risk Coronary Heart Disease Risk Table                     Men   Women  1/2 Average Risk   3.4   3.3      Wt Readings from Last 3 Encounters:  12/27/16 213 lb 12.8 oz (97 kg)  11/24/16 213 lb (96.6 kg)  05/09/16 216 lb (98 kg)     Cardiac Studies Reviewed: 2D echo 03-24-2016: Study Conclusions  - Left ventricle: Inferior septal and apical hypokinesis The cavity   size was mildly dilated. Wall thickness was increased in a   pattern of mild LVH. Systolic function was moderately to severely   reduced. The estimated ejection fraction was in the range of 30%   to 35%. - Mitral valve: Severely thickend leaflets with some diastolic   doming. Mild MS and mild MR. - Left atrium: The atrium was severely dilated. - Atrial septum: No defect or patent foramen ovale was identified.  Cardiac cath 07-04-2012: Cardiac  Catheterization Procedure Note  Name: CADEL STAIRS MRN: [id number] DOB: 04-10-1931  Procedure: Left Heart Cath, Selective Coronary Angiography, LV angiography, LIMA angiography, free radial graft angiography, FFR of the Left Circumflex  Indication: Exertional angina, accelerating symptoms             Procedural details: The right groin was prepped, draped, and anesthetized with 1% lidocaine. Using modified Seldinger technique, a 5 French sheath was introduced into the right femoral artery. Standard Judkins catheters were used for coronary angiography and left ventriculography. Catheter exchanges were performed over a guidewire. There were no immediate procedural complications. The patient was transferred to the post catheterization recovery area for further monitoring.  Following the diagnostic procedure, I elected to perform pressure wire analysis of the left circumflex. There is eccentric calcification and I was uncertain about the degree of stenosis. In some views this appeared tight in other views it appeared widely patent. Unfractionated heparin was administered. The sheath was upsized to a 6 [place]. Using normal technique, a pressure wire was advanced into the distal circumflex beyond the area of stenosis once a therapeutic ACT had been achieved. Intravenous adenosine was administered. The FFR at peak hyperemia was 0.96. The patient tolerated the procedure well. There were no immediate complications. A Perclose device was deployed for femoral hemostasis.  Procedural Findings: Hemodynamics:  AO 93/51 LV 93/8              Coronary angiography: Coronary dominance: right  Left mainstem: The left main is moderately calcified. There is 30% distal left main stenosis.  Left anterior descending (LAD): The LAD is heavily calcified. The vessel has 75% proximal vessel stenosis. There is both antegrade and competitive filling of the mid and distal LAD from the graft. The vessel reaches  the left ventricular apex.  Left circumflex (LCx): The left circumflex has moderate to severe calcification. This is a large, dominant vessel. The proximal circumflex has eccentric calcification with associated 50% stenosis. In some views this appears tight. In other views this appears nonobstructive. The first OM is patent with nonobstructive disease. The origin arises from the area of heavy calcification in the proximal circumflex. There is 50% ostial stenosis present. The second OM has 80% ostial stenosis and this vessel was grafted. The distal circumflex is widely patent and it is a large caliber vessel. It gives off a PDA and PLA branch without significant stenoses.  Right coronary artery (RCA): The RCA is patent. The vessel is nondominant. There is moderate stenosis in the mid vessel.  LIMA to LAD: This is a large caliber conduit and it is widely patent. The distal anastomotic site appears widely patent.  Free radial graft to second OM. The ostium has 50% stenosis. This is focal and it involves the true ostium. I suspect this is mechanical. The remaining portions of the vessel are widely patent without significant stenosis. The distal anastomosis is patent without stenosis. There is TIMI-3 flow present.  Left ventriculography: There is hypokinesis and dyssynergy of the anterior wall. The LV is not well-opacified. I would estimate the left ventricular ejection fraction at 35-40%.  Final Conclusions:   1. 2 vessel coronary artery disease with severe stenosis of the proximal LAD and continued patency of the LAD, moderate stenosis of the proximal circumflex with heavy calcification and continued patency of the free RIMA to obtuse marginal graft 2. Moderate segmental left jugular systolic dysfunction 3. Negative pressure wire analysis of the left circumflex  Recommendations: Continued medical management is indicated.  Sherren Mocha 07/04/2012, 3:44 PM  ASSESSMENT AND PLAN: 1.  CAD,  native vessel, with a multitude of symptoms consider anginal equivalent.  The patient has class III symptoms.  His history is fairly complex and he clearly has had symptoms related to medication side effect as well as paroxysmal atrial fibrillation.  However, he and his wife are convinced there is something else going on as his exercise capacity has declined rapidly.  He has symptoms including progressive shortness of breath, diaphoresis, and nausea with exertion.  We discussed potential noninvasive and invasive evaluation options.  Considering his complex history, permanent pacing, previous bypass grafting, and fairly debilitating symptoms, I have recommended right and left heart catheterization for definitive evaluation. I have reviewed the risks, indications, and alternatives to cardiac catheterization, possible angioplasty, and stenting with the patient. Risks include but are not limited to bleeding, infection, vascular injury, stroke, myocardial infection, arrhythmia, kidney injury, radiation-related injury in the case of prolonged fluoroscopy use, emergency cardiac surgery, and death. The patient understands the risks of serious complication is 1-2 in 2637 with diagnostic cardiac cath and 1-2% or less with angioplasty/stenting.   2.  Paroxysmal atrial fibrillation: Managed with antiarrhythmic drug therapy including dofetilide and were no was seen.  Unable to tolerate anticoagulant drugs.  Managed by Dr. Caryl Comes.  3.  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  Most recent echo study demonstrated intact mitral valve function.  Exam is not suggestive of mitral regurgitation.  4.  Hyperlipidemia: Treated with atorvastatin 80 mg daily.  Current medicines are reviewed with the patient today.  The patient does not have concerns regarding medicines.  Labs/ tests ordered today include:   Orders Placed This Encounter    Procedures  . INR/PT  .  Basic metabolic panel  . CBC with Differential/Platelet  . EKG 12-Lead    Disposition:   FU pending test results  Signed, Sherren Mocha, MD  12/27/2016 5:54 PM    [facility] [address], [place], [place]  [address] Phone: [phone]; Fax: [phone]

## 2016-12-27 NOTE — H&P (View-Only) (Signed)
Cardiology Office Note Date:  12/27/2016   ID:  AYDON SWAMY, DOB [birth year], MRN [id number]  PCP:  [name], MD  Cardiologist:  Sherren Mocha, MD    Chief Complaint  Patient presents with  . Shortness of Breath     History of Present Illness: [name] is a 83 y.o. male who presents for evaluation of fatigue, shortness of breath, and generalized exercise intolerance.   Patient has a history of coronary artery disease with remote CABG and mitral valve repair.  He underwent heart catheterization in 2014 demonstrating two-vessel coronary artery disease with severe stenosis of the proximal LAD and continued patency of the LIMA to LAD graft, moderate stenosis of the proximal circumflex with heavy calcification and continued patency of the free RIMA graft to obtuse marginal.  Pressure wire analysis of the left circumflex was negative at that time.  He is having problems recently with exertion. States he feels lightheaded and weak with exertion. Also complains of diaphoresis with activity. He had started ranexa and read that dizziness is a side effect and he stopped this. He then had what he perceived as symptomatic atrial fibrillation, with further dizziness and weakness. He denies chest pain or tightness. Complains of exertional dyspnea. He doesn't appreciate much change but his wife thinks he's 'slowed down.'  He has symptoms with low level activity such as taking out the trash or walking short distances.  He is really not able to do much because of weakness, shortness of breath, nausea with activity, and lightheadedness with activity.  He has started back on Ranexa and feels like his atrial fibrillation is better controlled when he is on both Ranexa and dofetilide.  He has not been able to tolerate anticoagulant drugs.  Past Medical History:  Diagnosis Date  . Coronary artery disease    [facility]  . Diabetes mellitus   . Endocarditis, valve  unspecified    Mitral  . History of migraine headaches   . History of seasonal allergies   . ICD (implantable cardiac defibrillator) in place   . PAF (paroxysmal atrial fibrillation) ([place])    NO LAA occlusion at the time of surgery  [name] 2018  . Pleural effusion   . PVC's (premature ventricular contractions)   . Raynaud's disease   . Ventricular tachycardia, polymorphic ([place])    status post ICD implantation    Past Surgical History:  Procedure Laterality Date  . CARDIAC CATHETERIZATION  04/03/2007   EF 40%  . CARDIAC CATHETERIZATION  02/06/2006   EF 45%. ANTERIOR HYPOKINESIS  . CARDIAC CATHETERIZATION  05/29/2000   EF 50%. MILD ANTERIOR HYPOKINESIS  . CARDIAC CATHETERIZATION  10/25/1999   EF 50%. SEVERE MITRAL REGURGITATION  . CARDIAC CATHETERIZATION  01/06/2003   EF 50%  . CARDIAC DEFIBRILLATOR PLACEMENT    . CORONARY ARTERY BYPASS GRAFT  2001   2 VESSEL CABG AND MITRAL VALVE REPAIR. HE HAD A LIMA GRAFT TO THE LAD AND RADIAL ARTERY  GRAFT TO THE OBTUSE MARGINAL  OF LEFT CIRCUMFLEX  . EP IMPLANTABLE DEVICE N/A 11/06/2014   Procedure: ICD Generator Changeout;  Surgeon: Deboraha Sprang, MD;  Location: [facility];  Service: Cardiovascular;  Laterality: N/A;  . HEMORROIDECTOMY    . KNEE ARTHROSCOPY     RIGHT KNEE  . LEFT HEART CATHETERIZATION WITH CORONARY ANGIOGRAM N/A 07/04/2012   Procedure: LEFT HEART CATHETERIZATION WITH CORONARY ANGIOGRAM;  Surgeon: Sherren Mocha, MD;  Location: [facility] CATH [facility];  Service:  Cardiovascular;  Laterality: N/A;  . MITRAL VALVE REPLACEMENT     No LAA occlusion at the time of surgery  Karie Soda report 2018  . TRANSTHORACIC ECHOCARDIOGRAM  04/02/2007   EF 35%    Current Outpatient Medications  Medication Sig Dispense Refill  . aspirin 81 MG chewable tablet Chew 81 mg by mouth daily.    Marland Kitchen atorvastatin (LIPITOR) 80 MG tablet Take 1 tablet by mouth Daily.    . Cholecalciferol (VITAMIN D-3) 5000 UNITS TABS Take 5,000-10,000 Units by mouth as directed.  EVERY OTHER DAY. Take 1 tablet four times a WEEK and then take 2 tablets three times a WEEK.    Marland Kitchen EPIPEN 2-PAK 0.3 MG/0.3ML SOAJ injection Inject 0.3 mg into the muscle as directed. USE ONLY IN EMERGENCY    . fluticasone (FLONASE) 50 MCG/ACT nasal spray Place 1 spray into the nose daily as needed for rhinitis or allergies.    . Magnesium 250 MG TABS Take 250 mg by mouth daily.     . metoprolol succinate (TOPROL-XL) 50 MG 24 hr tablet TAKE ONE TABLET BY MOUTH DAILY IMMEDIATELY FOLLOWING A A MEAL 90 tablet 1  . Multiple Vitamin (MULTIVITAMIN WITH MINERALS) TABS Take 1 tablet by mouth daily.    . Multiple Vitamins-Minerals (PRESERVISION AREDS 2) CAPS Take 2 capsules by mouth daily.    . nateglinide (STARLIX) 120 MG tablet Take 1 tablet by mouth Twice daily.    . pioglitazone (ACTOS) 45 MG tablet Take 45 mg by mouth daily.      . ranolazine (RANEXA) 500 MG 12 hr tablet Take 1 tablet (500 mg total) by mouth 2 (two) times daily. 60 tablet 3  . sitaGLIPtin (JANUVIA) 100 MG tablet Take 100 mg by mouth daily.      Marland Kitchen [name] 500 MCG capsule Take 1 capsule (500 mcg total) 2 (two) times daily by mouth. 180 capsule 3   No current facility-administered medications for this visit.     Allergies:   Latex; Metformin; Rivaroxaban; Other; Sotalol; and Warfarin sodium   Social History:  The patient  reports that he quit smoking about 33 years ago. he has never used smokeless tobacco. He reports that he drinks alcohol. He reports that he does not use drugs.   Family History:  The patient's or  family history includes Cancer in his other and sister; Diabetes in his other; Emphysema in his father; Heart disease in his father, paternal grandfather, and paternal grandmother; Stroke in his mother.    ROS:  Please see the history of present illness.  All other systems are reviewed and negative.    PHYSICAL EXAM: VS:  BP 114/76   Pulse 73   Ht 6\' 2"  (1.88 m)   Wt 213 lb 12.8 oz (97 kg)   BMI 27.45 kg/m  , BMI  Body mass index is 27.45 kg/m. GEN: Well nourished, well developed, in no acute distress  HEENT: normal  Neck: no JVD, no masses. No carotid bruits Cardiac: RRR without murmur or gallop                Respiratory:  clear to auscultation bilaterally, normal work of breathing GI: soft, nontender, nondistended, + BS MS: no deformity or atrophy  Ext: no pretibial edema, pedal pulses 2+= bilaterally Skin: warm and dry, no rash Neuro:  Strength and sensation are intact Psych: euthymic mood, full affect  EKG:  EKG is ordered today. The ekg ordered today shows electronic ventricular pacemaker 73 bpm  Recent Labs:  11/24/2016: BUN 13; Creatinine, Ser 1.04; Magnesium 2.2; Potassium 4.9; Sodium 138   Lipid Panel     Component Value Date/Time   CHOL  04/02/2007 0603    195        ATP III CLASSIFICATION:  <200     mg/dL   Desirable  200-239  mg/dL   Borderline High  >=240    mg/dL   High   TRIG 231 (H) 04/02/2007 0603   HDL 47 04/02/2007 0603   CHOLHDL 4.1 04/02/2007 0603   VLDL 46 (H) 04/02/2007 0603   LDLCALC (H) 04/02/2007 0603    102        Total Cholesterol/HDL:CHD Risk Coronary Heart Disease Risk Table                     Men   Women  1/2 Average Risk   3.4   3.3      Wt Readings from Last 3 Encounters:  12/27/16 213 lb 12.8 oz (97 kg)  11/24/16 213 lb (96.6 kg)  05/09/16 216 lb (98 kg)     Cardiac Studies Reviewed: 2D echo 03-24-2016: Study Conclusions  - Left ventricle: Inferior septal and apical hypokinesis The cavity   size was mildly dilated. Wall thickness was increased in a   pattern of mild LVH. Systolic function was moderately to severely   reduced. The estimated ejection fraction was in the range of 30%   to 35%. - Mitral valve: Severely thickend leaflets with some diastolic   doming. Mild MS and mild MR. - Left atrium: The atrium was severely dilated. - Atrial septum: No defect or patent foramen ovale was identified.  Cardiac cath 07-04-2012: Cardiac  Catheterization Procedure Note  Name: TYRE BEAVER MRN: [id number] DOB: [birth year]  Procedure: Left Heart Cath, Selective Coronary Angiography, LV angiography, LIMA angiography, free radial graft angiography, FFR of the Left Circumflex  Indication: Exertional angina, accelerating symptoms             Procedural details: The right groin was prepped, draped, and anesthetized with 1% lidocaine. Using modified Seldinger technique, a 5 French sheath was introduced into the right femoral artery. Standard Judkins catheters were used for coronary angiography and left ventriculography. Catheter exchanges were performed over a guidewire. There were no immediate procedural complications. The patient was transferred to the post catheterization recovery area for further monitoring.  Following the diagnostic procedure, I elected to perform pressure wire analysis of the left circumflex. There is eccentric calcification and I was uncertain about the degree of stenosis. In some views this appeared tight in other views it appeared widely patent. Unfractionated heparin was administered. The sheath was upsized to a 6 [place]. Using normal technique, a pressure wire was advanced into the distal circumflex beyond the area of stenosis once a therapeutic ACT had been achieved. Intravenous adenosine was administered. The FFR at peak hyperemia was 0.96. The patient tolerated the procedure well. There were no immediate complications. A Perclose device was deployed for femoral hemostasis.  Procedural Findings: Hemodynamics:  AO 93/51 LV 93/8              Coronary angiography: Coronary dominance: right  Left mainstem: The left main is moderately calcified. There is 30% distal left main stenosis.  Left anterior descending (LAD): The LAD is heavily calcified. The vessel has 75% proximal vessel stenosis. There is both antegrade and competitive filling of the mid and distal LAD from the graft. The vessel reaches  the left ventricular apex.  Left circumflex (LCx): The left circumflex has moderate to severe calcification. This is a large, dominant vessel. The proximal circumflex has eccentric calcification with associated 50% stenosis. In some views this appears tight. In other views this appears nonobstructive. The first OM is patent with nonobstructive disease. The origin arises from the area of heavy calcification in the proximal circumflex. There is 50% ostial stenosis present. The second OM has 80% ostial stenosis and this vessel was grafted. The distal circumflex is widely patent and it is a large caliber vessel. It gives off a PDA and PLA branch without significant stenoses.  Right coronary artery (RCA): The RCA is patent. The vessel is nondominant. There is moderate stenosis in the mid vessel.  LIMA to LAD: This is a large caliber conduit and it is widely patent. The distal anastomotic site appears widely patent.  Free radial graft to second OM. The ostium has 50% stenosis. This is focal and it involves the true ostium. I suspect this is mechanical. The remaining portions of the vessel are widely patent without significant stenosis. The distal anastomosis is patent without stenosis. There is TIMI-3 flow present.  Left ventriculography: There is hypokinesis and dyssynergy of the anterior wall. The LV is not well-opacified. I would estimate the left ventricular ejection fraction at 35-40%.  Final Conclusions:   1. 2 vessel coronary artery disease with severe stenosis of the proximal LAD and continued patency of the LAD, moderate stenosis of the proximal circumflex with heavy calcification and continued patency of the free RIMA to obtuse marginal graft 2. Moderate segmental left jugular systolic dysfunction 3. Negative pressure wire analysis of the left circumflex  Recommendations: Continued medical management is indicated.  Sherren Mocha 07/04/2012, 3:44 PM  ASSESSMENT AND PLAN: 1.  CAD,  native vessel, with a multitude of symptoms consider anginal equivalent.  The patient has class III symptoms.  His history is fairly complex and he clearly has had symptoms related to medication side effect as well as paroxysmal atrial fibrillation.  However, he and his wife are convinced there is something else going on as his exercise capacity has declined rapidly.  He has symptoms including progressive shortness of breath, diaphoresis, and nausea with exertion.  We discussed potential noninvasive and invasive evaluation options.  Considering his complex history, permanent pacing, previous bypass grafting, and fairly debilitating symptoms, I have recommended right and left heart catheterization for definitive evaluation. I have reviewed the risks, indications, and alternatives to cardiac catheterization, possible angioplasty, and stenting with the patient. Risks include but are not limited to bleeding, infection, vascular injury, stroke, myocardial infection, arrhythmia, kidney injury, radiation-related injury in the case of prolonged fluoroscopy use, emergency cardiac surgery, and death. The patient understands the risks of serious complication is 1-2 in 2595 with diagnostic cardiac cath and 1-2% or less with angioplasty/stenting.   2.  Paroxysmal atrial fibrillation: Managed with antiarrhythmic drug therapy including dofetilide and were no was seen.  Unable to tolerate anticoagulant drugs.  Managed by Dr. Caryl Comes.  3.  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  Most recent echo study demonstrated intact mitral valve function.  Exam is not suggestive of mitral regurgitation.  4.  Hyperlipidemia: Treated with atorvastatin 80 mg daily.  Current medicines are reviewed with the patient today.  The patient does not have concerns regarding medicines.  Labs/ tests ordered today include:   Orders Placed This Encounter    Procedures  . INR/PT  .  Basic metabolic panel  . CBC with Differential/Platelet  . EKG 12-Lead    Disposition:   FU pending test results  Signed, Sherren Mocha, MD  12/27/2016 5:54 PM    [facility] [place], [place], [place]  [address] Phone: [phone]; Fax: [phone]

## 2016-12-27 NOTE — Patient Instructions (Addendum)
Medication Instructions:  Your provider recommends that you continue on your current medications as directed. Please refer to the Current Medication list given to you today.    Labwork: TODAY: BMET, CBC, PT/INR  Testing/Procedures: Your physician has requested that you have a cardiac catheterization. Cardiac catheterization is used to diagnose and/or treat various heart conditions. Doctors may recommend this procedure for a number of different reasons. The most common reason is to evaluate chest pain. Chest pain can be a symptom of coronary artery disease (CAD), and cardiac catheterization can show whether plaque is narrowing or blocking your heart's arteries. This procedure is also used to evaluate the valves, as well as measure the blood flow and oxygen levels in different parts of your heart. For further information please visit [email or web address]. Please follow instruction sheet, as given.  Follow-Up: Your provider recommends that you schedule a follow-up appointment AS NEEDED pending results of your catheterization.    Any Other Special Instructions Will Be Listed Below (If Applicable).   [facility] [address], [address] [place] [address] Dept: [phone] Loc: [phone]  KHYLAN SAWYER  12/27/2016  You are scheduled for a Cardiac Catheterization on Friday, December 7 with Dr. Sherren Mocha.  1. Please arrive at the [facility] (Main Entrance A) at [facility]: [address] [place], [place] [address] at 5:30 AM (two hours before your procedure to ensure your preparation). Free valet parking service is available.   Special note: Every effort is made to have your procedure done on time. Please understand that emergencies sometimes delay scheduled procedures.  2. Diet: Do not eat or drink anything after midnight prior to your procedure except sips of water to take  medications.  3. Labs: You will have labs drawn TODAY.  4. Medication instructions in preparation for your procedure:  1) HOLD ALL DIABETIC MEDICATIONS the morning of your test.  2) TAKE ASPIRIN 81 mg the morning of your catheterization  3) You may take all other medications as directed.  5. Plan for one night stay--bring personal belongings. 6. Bring a current list of your medications and current insurance cards. 7. You MUST have a responsible person to drive you home. 8. Someone MUST be with you the first 24 hours after you arrive home or your discharge will be delayed. 9. Please wear clothes that are easy to get on and off and wear slip-on shoes.  Thank you for allowing [name] to care for you!   -- [facility]     If you need a refill on your cardiac medications before your next appointment, please call your pharmacy.

## 2016-12-28 LAB — BASIC METABOLIC PANEL
BUN/Creatinine Ratio: 12 (ref 10–24)
BUN: 13 mg/dL (ref 8–27)
CO2: 26 mmol/L (ref 20–29)
Calcium: 9.4 mg/dL (ref 8.6–10.2)
Chloride: 95 mmol/L — ABNORMAL LOW (ref 96–106)
Creatinine, Ser: 1.13 mg/dL (ref 0.76–1.27)
GFR calc Af Amer: 69 mL/min/{1.73_m2} (ref >59)
GFR calc non Af Amer: 60 mL/min/{1.73_m2} (ref >59)
Glucose: 131 mg/dL — ABNORMAL HIGH (ref 65–99)
Potassium: 4.9 mmol/L (ref 3.5–5.2)
Sodium: 133 mmol/L — ABNORMAL LOW (ref 134–144)

## 2016-12-28 LAB — CBC WITH DIFFERENTIAL/PLATELET
Basophils Absolute: 0 10*3/uL (ref 0.0–0.2)
Basos: 1 %
EOS (ABSOLUTE): 0.2 10*3/uL (ref 0.0–0.4)
Eos: 4 %
Hematocrit: 40.2 % (ref 37.5–51.0)
Hemoglobin: 14 g/dL (ref 13.0–17.7)
Immature Grans (Abs): 0 10*3/uL (ref 0.0–0.1)
Immature Granulocytes: 0 %
Lymphocytes Absolute: 0.9 10*3/uL (ref 0.7–3.1)
Lymphs: 16 %
MCH: 33.6 pg — ABNORMAL HIGH (ref 26.6–33.0)
MCHC: 34.8 g/dL (ref 31.5–35.7)
MCV: 96 fL (ref 79–97)
Monocytes Absolute: 0.6 10*3/uL (ref 0.1–0.9)
Monocytes: 10 %
Neutrophils Absolute: 3.8 10*3/uL (ref 1.4–7.0)
Neutrophils: 69 %
Platelets: 156 10*3/uL (ref 150–379)
RBC: 4.17 x10E6/uL (ref 4.14–5.80)
RDW: 14.1 % (ref 12.3–15.4)
WBC: 5.5 10*3/uL (ref 3.4–10.8)

## 2016-12-28 LAB — PROTIME-INR
INR: 1.1 (ref 0.8–1.2)
Prothrombin Time: 11.2 s (ref 9.1–12.0)

## 2017-01-04 ENCOUNTER — Telehealth: Payer: Self-pay

## 2017-01-04 NOTE — Telephone Encounter (Signed)
Detailed message left per DPR  Patient contacted pre-catheterization at [facility] scheduled for:  01/05/2017 @ 0730 Verified arrival time and place:  NT @ 0530 Confirmed AM meds to be taken pre-cath with sip of water: Take ASA Hold januvia, starlix, and actos Patient has responsible person to drive home post procedure and observe patient for 24 hours:  yes

## 2017-01-05 ENCOUNTER — Encounter (HOSPITAL_COMMUNITY)
Admission: RE | Disposition: A | Discharge status: Home / Self Care | Payer: Self-pay | Source: Ambulatory Visit | Attending: Cardiovascular Disease

## 2017-01-05 ENCOUNTER — Ambulatory Visit (HOSPITAL_COMMUNITY)
Admission: RE | Admit: 2017-01-05 | Discharge: 2017-01-05 | Disposition: A | Discharge status: Home / Self Care | Payer: Medicare Other | Source: Ambulatory Visit | Attending: Cardiovascular Disease | Admitting: Cardiovascular Disease

## 2017-01-05 DIAGNOSIS — Z7982 Long term (current) use of aspirin: Secondary | ICD-10-CM | POA: Insufficient documentation

## 2017-01-05 DIAGNOSIS — Z91040 Latex allergy status: Secondary | ICD-10-CM | POA: Insufficient documentation

## 2017-01-05 DIAGNOSIS — E119 Type 2 diabetes mellitus without complications: Secondary | ICD-10-CM | POA: Diagnosis not present

## 2017-01-05 DIAGNOSIS — Z8249 Family history of ischemic heart disease and other diseases of the circulatory system: Secondary | ICD-10-CM | POA: Insufficient documentation

## 2017-01-05 DIAGNOSIS — I25118 Atherosclerotic heart disease of native coronary artery with other forms of angina pectoris: Secondary | ICD-10-CM | POA: Diagnosis not present

## 2017-01-05 DIAGNOSIS — I480 Paroxysmal atrial fibrillation: Secondary | ICD-10-CM | POA: Insufficient documentation

## 2017-01-05 DIAGNOSIS — Z79899 Other long term (current) drug therapy: Secondary | ICD-10-CM | POA: Diagnosis not present

## 2017-01-05 DIAGNOSIS — Z951 Presence of aortocoronary bypass graft: Secondary | ICD-10-CM | POA: Diagnosis not present

## 2017-01-05 DIAGNOSIS — I2584 Coronary atherosclerosis due to calcified coronary lesion: Secondary | ICD-10-CM | POA: Insufficient documentation

## 2017-01-05 DIAGNOSIS — Z95810 Presence of automatic (implantable) cardiac defibrillator: Secondary | ICD-10-CM | POA: Insufficient documentation

## 2017-01-05 HISTORY — PX: RIGHT/LEFT HEART CATH AND CORONARY/GRAFT ANGIOGRAPHY: CATH118267

## 2017-01-05 HISTORY — PX: INTRAVASCULAR PRESSURE WIRE/FFR STUDY: CATH118243

## 2017-01-05 LAB — POCT I-STAT 3, VENOUS BLOOD GAS (G3P V)
Acid-base deficit: 2 mmol/L (ref 0.0–2.0)
Bicarbonate: 24.5 mmol/L (ref 20.0–28.0)
O2 Saturation: 73 %
TCO2: 26 mmol/L (ref 22–32)
pCO2, Ven: 45.7 mmHg (ref 44.0–60.0)
pH, Ven: 7.337 (ref 7.250–7.430)
pO2, Ven: 41 mmHg (ref 32.0–45.0)

## 2017-01-05 LAB — POCT I-STAT 3, ART BLOOD GAS (G3+)
Acid-base deficit: 2 mmol/L (ref 0.0–2.0)
Bicarbonate: 24.1 mmol/L (ref 20.0–28.0)
O2 Saturation: 99 %
TCO2: 25 mmol/L (ref 22–32)
pCO2 arterial: 44.8 mmHg (ref 32.0–48.0)
pH, Arterial: 7.338 — ABNORMAL LOW (ref 7.350–7.450)
pO2, Arterial: 137 mmHg — ABNORMAL HIGH (ref 83.0–108.0)

## 2017-01-05 LAB — GLUCOSE, CAPILLARY
Glucose-Capillary: 158 mg/dL — ABNORMAL HIGH (ref 65–99)
Glucose-Capillary: 107 mg/dL — ABNORMAL HIGH (ref 65–99)

## 2017-01-05 LAB — POCT ACTIVATED CLOTTING TIME
Activated Clotting Time: 186 seconds
Activated Clotting Time: 197 seconds

## 2017-01-05 SURGERY — RIGHT/LEFT HEART CATH AND CORONARY/GRAFT ANGIOGRAPHY
Anesthesia: LOCAL

## 2017-01-05 MED ORDER — FENTANYL CITRATE (PF) 100 MCG/2ML IJ SOLN
INTRAMUSCULAR | Status: DC | PRN
  Administered 2017-01-05 (×2): 25 ug via INTRAVENOUS

## 2017-01-05 MED ORDER — SODIUM CHLORIDE 0.9 % IV SOLN: 250.0000 mL | INTRAVENOUS | Status: DC | PRN

## 2017-01-05 MED ORDER — SODIUM CHLORIDE 0.9 % WEIGHT BASED INFUSION
3.0000 mL/kg/h | INTRAVENOUS | Status: AC
  Administered 2017-01-05: 3 mL/kg/h via INTRAVENOUS

## 2017-01-05 MED ORDER — HEPARIN (PORCINE) IN NACL 2-0.9 UNIT/ML-% IJ SOLN
INTRAMUSCULAR | Status: AC
  Filled 2017-01-05: qty 500

## 2017-01-05 MED ORDER — NITROGLYCERIN 1 MG/10 ML FOR IR/CATH LAB
INTRA_ARTERIAL | Status: AC
  Filled 2017-01-05: qty 10

## 2017-01-05 MED ORDER — SODIUM CHLORIDE 0.9% FLUSH: 3.0000 mL | Freq: Two times a day (BID) | INTRAVENOUS | Status: DC

## 2017-01-05 MED ORDER — SODIUM CHLORIDE 0.9% FLUSH: 3.0000 mL | INTRAVENOUS | Status: DC | PRN

## 2017-01-05 MED ORDER — ASPIRIN 81 MG PO CHEW
81.0000 mg | CHEWABLE_TABLET | ORAL | Status: AC
  Administered 2017-01-05: 81 mg via ORAL

## 2017-01-05 MED ORDER — FENTANYL CITRATE (PF) 100 MCG/2ML IJ SOLN
INTRAMUSCULAR | Status: AC
  Filled 2017-01-05: qty 2

## 2017-01-05 MED ORDER — LIDOCAINE HCL (PF) 1 % IJ SOLN
INTRAMUSCULAR | Status: AC
  Filled 2017-01-05: qty 30

## 2017-01-05 MED ORDER — HEPARIN (PORCINE) IN NACL 2-0.9 UNIT/ML-% IJ SOLN
INTRAMUSCULAR | Status: AC | PRN
  Administered 2017-01-05: 1500 mL via INTRA_ARTERIAL

## 2017-01-05 MED ORDER — SODIUM CHLORIDE 0.9 % WEIGHT BASED INFUSION: 1.0000 mL/kg/h | INTRAVENOUS | Status: DC

## 2017-01-05 MED ORDER — IOPAMIDOL (ISOVUE-370) INJECTION 76%
INTRAVENOUS | Status: DC | PRN
  Administered 2017-01-05: 100 mL via INTRA_ARTERIAL

## 2017-01-05 MED ORDER — MIDAZOLAM HCL 2 MG/2ML IJ SOLN
INTRAMUSCULAR | Status: DC | PRN
  Administered 2017-01-05 (×2): 1 mg via INTRAVENOUS

## 2017-01-05 MED ORDER — ADENOSINE 12 MG/4ML IV SOLN
INTRAVENOUS | Status: AC
  Filled 2017-01-05: qty 16

## 2017-01-05 MED ORDER — HEPARIN (PORCINE) IN NACL 2-0.9 UNIT/ML-% IJ SOLN
INTRAMUSCULAR | Status: AC
  Filled 2017-01-05: qty 1000

## 2017-01-05 MED ORDER — LIDOCAINE HCL (PF) 1 % IJ SOLN
INTRAMUSCULAR | Status: DC | PRN
  Administered 2017-01-05: 15 mL via INTRADERMAL

## 2017-01-05 MED ORDER — HEPARIN SODIUM (PORCINE) 1000 UNIT/ML IJ SOLN
INTRAMUSCULAR | Status: DC | PRN
  Administered 2017-01-05: 6000 [IU] via INTRAVENOUS

## 2017-01-05 MED ORDER — NITROGLYCERIN 1 MG/10 ML FOR IR/CATH LAB
INTRA_ARTERIAL | Status: DC | PRN
  Administered 2017-01-05: 150 ug via INTRACORONARY

## 2017-01-05 MED ORDER — IOPAMIDOL (ISOVUE-370) INJECTION 76%
INTRAVENOUS | Status: AC
  Filled 2017-01-05: qty 125

## 2017-01-05 MED ORDER — HEPARIN SODIUM (PORCINE) 1000 UNIT/ML IJ SOLN
INTRAMUSCULAR | Status: AC
  Filled 2017-01-05: qty 1

## 2017-01-05 MED ORDER — ADENOSINE (DIAGNOSTIC) 140MCG/KG/MIN
INTRAVENOUS | Status: DC | PRN
  Administered 2017-01-05: 140 ug/kg/min via INTRAVENOUS

## 2017-01-05 MED ORDER — MIDAZOLAM HCL 2 MG/2ML IJ SOLN
INTRAMUSCULAR | Status: AC
  Filled 2017-01-05: qty 2

## 2017-01-05 MED ORDER — ASPIRIN 81 MG PO CHEW
CHEWABLE_TABLET | ORAL | Status: AC
  Administered 2017-01-05: 81 mg via ORAL
  Filled 2017-01-05: qty 1

## 2017-01-05 SURGICAL SUPPLY — 21 items
CATH INFINITI 5FR MULTPACK ANG (CATHETERS) ×1 IMPLANT
CATH LAUNCHER 6FR EBU3.5 (CATHETERS) ×1 IMPLANT
CATH SWAN GANZ 7F STRAIGHT (CATHETERS) ×1 IMPLANT
COVER PRB 48X5XTLSCP FOLD TPE (BAG) ×1 IMPLANT
COVER PROBE 5X48 (BAG) ×2
GUIDEWIRE PRESSURE COMET II (WIRE) ×1 IMPLANT
KIT ESSENTIALS PG (KITS) ×1 IMPLANT
KIT HEART LEFT (KITS) ×2 IMPLANT
PACK CARDIAC CATHETERIZATION (CUSTOM PROCEDURE TRAY) ×2 IMPLANT
PINNACLE LONG 5F 25CM (SHEATH) ×2
PINNACLE LONG 6F 25CM (SHEATH) ×2
SHEATH INTRO PINNACLE 6F 25CM (SHEATH) IMPLANT
SHEATH INTROD PINNACLE 5F 25CM (SHEATH) ×1 IMPLANT
SHEATH PINNACLE 5F 10CM (SHEATH) ×2 IMPLANT
SHEATH PINNACLE 7F 10CM (SHEATH) ×1 IMPLANT
TRANSDUCER W/STOPCOCK (MISCELLANEOUS) ×2 IMPLANT
TUBING CIL FLEX 10 FLL-RA (TUBING) ×2 IMPLANT
WIRE EMERALD 3MM-J .025X260CM (WIRE) ×1 IMPLANT
WIRE EMERALD 3MM-J .035X150CM (WIRE) ×1 IMPLANT
WIRE EMERALD 3MM-J .035X260CM (WIRE) ×1 IMPLANT
WIRE HI TORQ VERSACORE-J 145CM (WIRE) ×1 IMPLANT

## 2017-01-05 NOTE — Progress Notes (Addendum)
Site area: RFA / RFV Site Prior to Removal:  Level 0 Pressure Applied For: total 30 min Manual:   yes Patient Status During Pull: stable   Post Pull Site:  Level 0 Post Pull Instructions Given:  yes Post Pull Pulses Present: palpable Dressing Applied:  tegaderm Bedrest begins @ 10000 till 1400 Comments:

## 2017-01-05 NOTE — Discharge Instructions (Signed)

## 2017-01-05 NOTE — Interval H&P Note (Signed)
Cath Lab Visit (complete for each Cath Lab visit)  Clinical Evaluation Leading to the Procedure:   ACS: No.  Non-ACS:    Anginal Classification: CCS III  Anti-ischemic medical therapy: Minimal Therapy (1 class of medications)  Non-Invasive Test Results: No non-invasive testing performed  Prior CABG: Previous CABG      History and Physical Interval Note:  01/05/2017 7:39 AM  [name]  has presented today for surgery, with the diagnosis of CAD with angina  The various methods of treatment have been discussed with the patient and family. After consideration of risks, benefits and other options for treatment, the patient has consented to  Procedure(s): RIGHT/LEFT HEART CATH AND CORONARY/GRAFT ANGIOGRAPHY (N/A) as a surgical intervention .  The patient's history has been reviewed, patient examined, no change in status, stable for surgery.  I have reviewed the patient's chart and labs.  Questions were answered to the patient's satisfaction.     Sherren Mocha

## 2017-01-07 ENCOUNTER — Encounter (HOSPITAL_COMMUNITY): Payer: Self-pay | Admitting: Cardiovascular Disease

## 2017-01-12 ENCOUNTER — Encounter: Payer: Self-pay | Admitting: Cardiovascular Disease

## 2017-02-06 ENCOUNTER — Encounter: Payer: Self-pay | Admitting: Nurse Practitioner

## 2017-02-06 ENCOUNTER — Ambulatory Visit (INDEPENDENT_AMBULATORY_CARE_PROVIDER_SITE_OTHER): Payer: Medicare Other | Admitting: *Deleted

## 2017-02-06 DIAGNOSIS — I255 Ischemic cardiomyopathy: Secondary | ICD-10-CM | POA: Diagnosis not present

## 2017-02-06 NOTE — Progress Notes (Signed)
Remote ICD transmission.   

## 2017-02-07 ENCOUNTER — Encounter: Payer: Self-pay | Admitting: Cardiology

## 2017-02-09 ENCOUNTER — Telehealth: Payer: Self-pay | Admitting: Internal Medicine

## 2017-02-09 NOTE — Telephone Encounter (Signed)
Called pt to inform him that his medication [name] 500 mg tablet, was ready to be picked up at the front desk and if he has any other problems, questions or concerns to call the office. Pt verbalize understanding.

## 2017-02-12 LAB — CUP PACEART REMOTE DEVICE CHECK
Battery Remaining Longevity: 73 mo
Battery Voltage: 2.98 V
Brady Statistic AP VP Percent: 79.69 %
Brady Statistic AP VS Percent: 0.23 %
Brady Statistic AS VP Percent: 16.66 %
Brady Statistic AS VS Percent: 3.42 %
Brady Statistic RA Percent Paced: 77.1 %
Brady Statistic RV Percent Paced: 94.17 %
Date Time Interrogation Session: 20190108081703
HighPow Impedance: 40 Ohm
HighPow Impedance: 53 Ohm
Implantable Lead Implant Date: 20040309
Implantable Lead Implant Date: 20040309
Implantable Lead Location: 753859
Implantable Lead Location: 753860
Implantable Lead Model: 158
Implantable Lead Model: 5076
Implantable Lead Serial Number: 115061
Implantable Pulse Generator Implant Date: 20161007
Lead Channel Impedance Value: 4047 Ohm
Lead Channel Impedance Value: 4047 Ohm
Lead Channel Impedance Value: 4047 Ohm
Lead Channel Impedance Value: 4047 Ohm
Lead Channel Impedance Value: 4047 Ohm
Lead Channel Impedance Value: 4047 Ohm
Lead Channel Impedance Value: 4047 Ohm
Lead Channel Impedance Value: 4047 Ohm
Lead Channel Impedance Value: 4047 Ohm
Lead Channel Impedance Value: 4047 Ohm
Lead Channel Impedance Value: 418 Ohm
Lead Channel Impedance Value: 532 Ohm
Lead Channel Impedance Value: 532 Ohm
Lead Channel Pacing Threshold Amplitude: 0.5 V
Lead Channel Pacing Threshold Amplitude: 0.625 V
Lead Channel Pacing Threshold Pulse Width: 0.4 ms
Lead Channel Pacing Threshold Pulse Width: 0.4 ms
Lead Channel Sensing Intrinsic Amplitude: 1.875 mV
Lead Channel Sensing Intrinsic Amplitude: 1.875 mV
Lead Channel Sensing Intrinsic Amplitude: 7.75 mV
Lead Channel Sensing Intrinsic Amplitude: 7.75 mV
Lead Channel Setting Pacing Amplitude: 2 V
Lead Channel Setting Pacing Amplitude: 2.5 V
Lead Channel Setting Pacing Pulse Width: 0.4 ms
Lead Channel Setting Sensing Sensitivity: 0.3 mV

## 2017-02-16 NOTE — Progress Notes (Signed)
Electrophysiology Office Note Date: 02/21/2017  ID:  [name], DOB 1932/01/10, MRN [id number]  PCP: [name], MD  Cardiologist: Burt Knack Electrophysiologist: Caryl Comes  CC: Routine ICD follow-up  [name] is a 84 y.o. male seen today for Dr Caryl Comes.  He presents today for routine EP follow up.  Since last being seen in our clinic, the patient reports doing relatively well.  He continues to have periods of AF.  He finds his patient activator helpful for obtaining information about rhythm.  He tried cardioversion for an episode in November that was unsuccessful.   He denies chest pain, dyspnea, PND, orthopnea, nausea, vomiting, dizziness, syncope, edema, weight gain, or early satiety.  He has not had ICD shocks.  Device History: STJ dual chamber ICD implanted 2004 for PMVT, ICM; generator change 2010; gen change 2016 (MDT device) History of appropriate therapy: Yes History of AAD therapy: Yes   Past Medical History:  Diagnosis Date  . Coronary artery disease    [facility]  . Diabetes mellitus   . Endocarditis, valve unspecified    Mitral  . History of migraine headaches   . History of seasonal allergies   . ICD (implantable cardiac defibrillator) in place   . PAF (paroxysmal atrial fibrillation) ([place])    NO LAA occlusion at the time of surgery  [name] 2018  . Pleural effusion   . PVC's (premature ventricular contractions)   . Raynaud's disease   . Ventricular tachycardia, polymorphic ([place])    status post ICD implantation   Past Surgical History:  Procedure Laterality Date  . CARDIAC CATHETERIZATION  04/03/2007   EF 40%  . CARDIAC CATHETERIZATION  02/06/2006   EF 45%. ANTERIOR HYPOKINESIS  . CARDIAC CATHETERIZATION  05/29/2000   EF 50%. MILD ANTERIOR HYPOKINESIS  . CARDIAC CATHETERIZATION  10/25/1999   EF 50%. SEVERE MITRAL REGURGITATION  . CARDIAC CATHETERIZATION  01/06/2003   EF 50%  . CARDIAC DEFIBRILLATOR PLACEMENT    . CORONARY  ARTERY BYPASS GRAFT  2001   2 VESSEL CABG AND MITRAL VALVE REPAIR. HE HAD A LIMA GRAFT TO THE LAD AND RADIAL ARTERY  GRAFT TO THE OBTUSE MARGINAL  OF LEFT CIRCUMFLEX  . EP IMPLANTABLE DEVICE N/A 11/06/2014   Procedure: ICD Generator Changeout;  Surgeon: Deboraha Sprang, MD;  Location: [facility];  Service: Cardiovascular;  Laterality: N/A;  . HEMORROIDECTOMY    . INTRAVASCULAR PRESSURE WIRE/FFR STUDY N/A 01/05/2017   Procedure: INTRAVASCULAR PRESSURE WIRE/FFR STUDY;  Surgeon: Sherren Mocha, MD;  Location: [facility];  Service: Cardiovascular;  Laterality: N/A;  . KNEE ARTHROSCOPY     RIGHT KNEE  . LEFT HEART CATHETERIZATION WITH CORONARY ANGIOGRAM N/A 07/04/2012   Procedure: LEFT HEART CATHETERIZATION WITH CORONARY ANGIOGRAM;  Surgeon: Sherren Mocha, MD;  Location: [facility] CATH LAB;  Service: Cardiovascular;  Laterality: N/A;  . MITRAL VALVE REPLACEMENT     No LAA occlusion at the time of surgery  Karie Soda report 2018  . RIGHT/LEFT HEART CATH AND CORONARY/GRAFT ANGIOGRAPHY N/A 01/05/2017   Procedure: RIGHT/LEFT HEART CATH AND CORONARY/GRAFT ANGIOGRAPHY;  Surgeon: Sherren Mocha, MD;  Location: [facility];  Service: Cardiovascular;  Laterality: N/A;  . TRANSTHORACIC ECHOCARDIOGRAM  04/02/2007   EF 35%    Current Outpatient Medications  Medication Sig Dispense Refill  . aspirin 81 MG chewable tablet Chew 81 mg by mouth every evening.     Marland Kitchen atorvastatin (LIPITOR) 80 MG tablet Take 80 mg by  mouth at bedtime.     . Cholecalciferol (VITAMIN D-3) 5000 UNITS TABS Take 5,000-10,000 Units by mouth every evening. Take 1 tablet (5000 units) on Sunday, Tuesday, Thursday, and Saturday. Take 2 tablets (1000 units) on Monday, Wednesday, and Friday    . EPIPEN 2-PAK 0.3 MG/0.3ML SOAJ injection Inject 0.3 mg into the muscle as directed. USE ONLY IN EMERGENCY    . fluticasone (FLONASE) 50 MCG/ACT nasal spray Place 1 spray into the nose daily as needed for rhinitis or allergies.    Marland Kitchen ibuprofen  (ADVIL,MOTRIN) 200 MG tablet Take 400 mg by mouth every 8 (eight) hours as needed (for pain.).    . Magnesium 500 MG TABS Take 500 mg by mouth daily.    . metoprolol succinate (TOPROL-XL) 50 MG 24 hr tablet TAKE ONE TABLET BY MOUTH DAILY IMMEDIATELY FOLLOWING A A MEAL 90 tablet 1  . Multiple Vitamin (MULTIVITAMIN WITH MINERALS) TABS Take 1 tablet by mouth daily. Centrum Silver    . Multiple Vitamins-Minerals (PRESERVISION AREDS 2) CAPS Take 1 capsule by mouth 2 (two) times daily.     . nateglinide (STARLIX) 120 MG tablet Take 120 mg by mouth Twice daily.     . pioglitazone (ACTOS) 45 MG tablet Take 45 mg by mouth daily.      . sitaGLIPtin (JANUVIA) 100 MG tablet Take 100 mg by mouth at bedtime.     Marland Kitchen [name] 500 MCG capsule Take 1 capsule (500 mcg total) 2 (two) times daily by mouth. 180 capsule 3   No current facility-administered medications for this visit.     Allergies:   Latex; Metformin; Rivaroxaban; Other; Sotalol; and Warfarin sodium   Social History: Social History   Socioeconomic History  . Marital status: Married    Spouse name: Not on file  . Number of children: Not on file  . Years of education: Not on file  . Highest education level: Not on file  Social Needs  . Financial resource strain: Not on file  . Food insecurity - worry: Not on file  . Food insecurity - inability: Not on file  . Transportation needs - medical: Not on file  . Transportation needs - non-medical: Not on file  Occupational History  . Not on file  Tobacco Use  . Smoking status: Former Smoker    Last attempt to quit: 01/31/1983    Years since quitting: 34.0  . Smokeless tobacco: Never Used  Substance and Sexual Activity  . Alcohol use: Yes    Alcohol/week: 0.0 oz    Comment: 1-2 a week  . Drug use: No  . Sexual activity: Not on file  Other Topics Concern  . Not on file  Social History Narrative  . Not on file    Family History: Family History  Problem Relation Age of Onset  . Heart  disease Father   . Emphysema Father   . Stroke Mother   . Cancer Sister        breast cancer  . Heart disease Paternal Grandmother   . Heart disease Paternal Grandfather   . Diabetes Other        grandmother, aunt, uncle  . Cancer Other        lung cancer, aunt    Review of Systems: All other systems reviewed and are otherwise negative except as noted above.   Physical Exam: VS:  BP 112/78   Pulse 78   Ht 6\' 2"  (1.88 m)   Wt 215 lb (97.5 kg)  SpO2 97%   BMI 27.60 kg/m  , BMI Body mass index is 27.6 kg/m.  GEN- The patient is well appearing, alert and oriented x 3 today.   HEENT: normocephalic, atraumatic; sclera clear, conjunctiva pink; hearing intact; oropharynx clear; neck supple Lungs- Clear to ausculation bilaterally, normal work of breathing.  No wheezes, rales, rhonchi Heart- Regular rate and rhythm (paced) GI- soft, non-tender, non-distended, bowel sounds present Extremities- no clubbing, cyanosis, or edema; DP/PT/radial pulses 2+ bilaterally MS- no significant deformity or atrophy Skin- warm and dry, no rash or lesion; ICD pocket well healed Psych- euthymic mood, full affect Neuro- strength and sensation are intact  ICD interrogation- reviewed in detail today,  See PACEART report  EKG:  EKG is not ordered today.  Recent Labs: 11/24/2016: Magnesium 2.2 12/27/2016: BUN 13; Creatinine, Ser 1.13; Hemoglobin 14.0; Platelets 156; Potassium 4.9; Sodium 133   Wt Readings from Last 3 Encounters:  02/21/17 215 lb (97.5 kg)  01/05/17 (P) 200 lb (90.7 kg)  12/27/16 213 lb 12.8 oz (97 kg)     Other studies Reviewed: Additional studies/ records that were reviewed today include: Dr Olin Pia office notes, Dr Antionette Char office notes  Assessment and Plan:  1.  Chronic systolic dysfunction euvolemic today Stable on an appropriate medical regimen Normal ICD function  See Pace Art report No changes today  2.  ICM/CAD No recent ischemic symptoms Continue medical  therapy  3.  Ventricular tachycardia No recent recurrence  4.  Paroxysmal atrial fibrillation Continue [name] BMET, Mg recently stable  QTc recently stable He declines OAC - he has had migraines with aura with both Xarelto and Warfarin He was intolerant of Ranexa LA by echo 2017 90mm - we discussed that this would limit success of ablation - he would like to continue as is for now especially considering he would need short term [place] for any procedure.   Current medicines are reviewed at length with the patient today.   The patient does not have concerns regarding his medicines.  The following changes were made today:  none  Labs/ tests ordered today include: none   Disposition:   Follow up with [facility], Dr Caryl Comes 6 months     Signed, Chanetta Marshall, NP 02/21/2017 10:38 AM  [facility] [address] [place] [address] [phone] (office) [phone] (fax)

## 2017-02-21 ENCOUNTER — Ambulatory Visit (INDEPENDENT_AMBULATORY_CARE_PROVIDER_SITE_OTHER): Payer: Medicare Other | Admitting: Nurse Practitioner

## 2017-02-21 ENCOUNTER — Encounter: Payer: Self-pay | Admitting: Nurse Practitioner

## 2017-02-21 VITALS — BP 112/78 | HR 78 | Ht 74.0 in | Wt 215.0 lb

## 2017-02-21 DIAGNOSIS — I2589 Other forms of chronic ischemic heart disease: Secondary | ICD-10-CM

## 2017-02-21 DIAGNOSIS — I480 Paroxysmal atrial fibrillation: Secondary | ICD-10-CM | POA: Diagnosis not present

## 2017-02-21 DIAGNOSIS — I255 Ischemic cardiomyopathy: Secondary | ICD-10-CM

## 2017-02-21 DIAGNOSIS — I5022 Chronic systolic (congestive) heart failure: Secondary | ICD-10-CM | POA: Diagnosis not present

## 2017-02-21 LAB — CUP PACEART INCLINIC DEVICE CHECK
Date Time Interrogation Session: 20190123102949
Implantable Lead Implant Date: 20040309
Implantable Lead Implant Date: 20040309
Implantable Lead Location: 753859
Implantable Lead Location: 753860
Implantable Lead Model: 158
Implantable Lead Model: 5076
Implantable Lead Serial Number: 115061
Implantable Pulse Generator Implant Date: 20161007

## 2017-02-21 NOTE — Patient Instructions (Addendum)
Medication Instructions:   Your physician recommends that you continue on your current medications as directed. Please refer to the Current Medication list given to you today.   If you need a refill on your cardiac medications before your next appointment, please call your pharmacy.  Labwork:  NONE ORDERED  TODAY    Testing/Procedures: NONE ORDERED  TODAY    Follow-Up:  Your physician wants you to follow-up in: [place] will receive a reminder letter in the mail two months in advance. If you don't receive a letter, please call our office to schedule the follow-up appointment.    Remote monitoring is used to monitor your Pacemaker of ICD from home. This monitoring reduces the number of office visits required to check your device to one time per year. It allows [name] to keep an eye on the functioning of your device to ensure it is working properly. You are scheduled 05-08-17 for a device check from home on . You may send your transmission at any time that day. If you have a wireless device, the transmission will be sent automatically. After your physician reviews your transmission, you will receive a postcard with your next transmission date.    Any Other Special Instructions Will Be Listed Below (If Applicable).

## 2017-03-12 ENCOUNTER — Other Ambulatory Visit: Payer: Self-pay | Admitting: Internal Medicine

## 2017-04-20 ENCOUNTER — Encounter: Payer: Self-pay | Admitting: Internal Medicine

## 2017-05-08 ENCOUNTER — Telehealth: Payer: Self-pay | Admitting: *Deleted

## 2017-05-08 ENCOUNTER — Ambulatory Visit (INDEPENDENT_AMBULATORY_CARE_PROVIDER_SITE_OTHER): Payer: Medicare Other | Admitting: *Deleted

## 2017-05-08 DIAGNOSIS — I255 Ischemic cardiomyopathy: Secondary | ICD-10-CM | POA: Diagnosis not present

## 2017-05-08 NOTE — Progress Notes (Signed)
Remote ICD transmission.   

## 2017-05-08 NOTE — Telephone Encounter (Signed)
VT with ATP x 3 overnight on 04/21/17. [name] does not recall the episode, hasn't missed any medication doses and is aware of the [place] driving restrictions x 6 months. I will have Dr. Caryl Comes review and call Mr. Suhre back if any recommendations are made.

## 2017-05-10 ENCOUNTER — Encounter: Payer: Self-pay | Admitting: Cardiology

## 2017-05-14 DIAGNOSIS — E782 Mixed hyperlipidemia: Secondary | ICD-10-CM | POA: Diagnosis not present

## 2017-05-14 DIAGNOSIS — E1159 Type 2 diabetes mellitus with other circulatory complications: Secondary | ICD-10-CM | POA: Diagnosis not present

## 2017-05-15 ENCOUNTER — Other Ambulatory Visit: Payer: Self-pay | Admitting: Internal Medicine

## 2017-05-16 DIAGNOSIS — D1801 Hemangioma of skin and subcutaneous tissue: Secondary | ICD-10-CM | POA: Diagnosis not present

## 2017-05-16 DIAGNOSIS — L570 Actinic keratosis: Secondary | ICD-10-CM | POA: Diagnosis not present

## 2017-05-16 DIAGNOSIS — L430 Hypertrophic lichen planus: Secondary | ICD-10-CM | POA: Diagnosis not present

## 2017-05-16 DIAGNOSIS — L820 Inflamed seborrheic keratosis: Secondary | ICD-10-CM | POA: Diagnosis not present

## 2017-05-16 DIAGNOSIS — L821 Other seborrheic keratosis: Secondary | ICD-10-CM | POA: Diagnosis not present

## 2017-05-16 DIAGNOSIS — L923 Foreign body granuloma of the skin and subcutaneous tissue: Secondary | ICD-10-CM | POA: Diagnosis not present

## 2017-05-16 DIAGNOSIS — D485 Neoplasm of uncertain behavior of skin: Secondary | ICD-10-CM | POA: Diagnosis not present

## 2017-05-17 DIAGNOSIS — Z7984 Long term (current) use of oral hypoglycemic drugs: Secondary | ICD-10-CM | POA: Diagnosis not present

## 2017-05-17 DIAGNOSIS — E113393 Type 2 diabetes mellitus with moderate nonproliferative diabetic retinopathy without macular edema, bilateral: Secondary | ICD-10-CM | POA: Diagnosis not present

## 2017-05-17 DIAGNOSIS — E782 Mixed hyperlipidemia: Secondary | ICD-10-CM | POA: Diagnosis not present

## 2017-05-17 DIAGNOSIS — Z87891 Personal history of nicotine dependence: Secondary | ICD-10-CM | POA: Diagnosis not present

## 2017-05-17 DIAGNOSIS — Z95810 Presence of automatic (implantable) cardiac defibrillator: Secondary | ICD-10-CM | POA: Diagnosis not present

## 2017-05-17 DIAGNOSIS — E1159 Type 2 diabetes mellitus with other circulatory complications: Secondary | ICD-10-CM | POA: Diagnosis not present

## 2017-05-17 DIAGNOSIS — E559 Vitamin D deficiency, unspecified: Secondary | ICD-10-CM | POA: Diagnosis not present

## 2017-05-17 DIAGNOSIS — I2510 Atherosclerotic heart disease of native coronary artery without angina pectoris: Secondary | ICD-10-CM | POA: Diagnosis not present

## 2017-05-17 DIAGNOSIS — I480 Paroxysmal atrial fibrillation: Secondary | ICD-10-CM | POA: Diagnosis not present

## 2017-05-17 DIAGNOSIS — Z951 Presence of aortocoronary bypass graft: Secondary | ICD-10-CM | POA: Diagnosis not present

## 2017-05-28 DIAGNOSIS — L0889 Other specified local infections of the skin and subcutaneous tissue: Secondary | ICD-10-CM | POA: Diagnosis not present

## 2017-05-29 LAB — CUP PACEART REMOTE DEVICE CHECK
Battery Remaining Longevity: 69 mo
Battery Voltage: 2.98 V
Brady Statistic AP VP Percent: 83.5 %
Brady Statistic AP VS Percent: 0.55 %
Brady Statistic AS VP Percent: 14.68 %
Brady Statistic AS VS Percent: 1.28 %
Brady Statistic RA Percent Paced: 81.18 %
Brady Statistic RV Percent Paced: 95.55 %
Date Time Interrogation Session: 20190409041704
HighPow Impedance: 42 Ohm
HighPow Impedance: 58 Ohm
Implantable Lead Implant Date: 20040309
Implantable Lead Implant Date: 20040309
Implantable Lead Location: 753859
Implantable Lead Location: 753860
Implantable Lead Model: 158
Implantable Lead Model: 5076
Implantable Lead Serial Number: 115061
Implantable Pulse Generator Implant Date: 20161007
Lead Channel Impedance Value: 4047 Ohm
Lead Channel Impedance Value: 4047 Ohm
Lead Channel Impedance Value: 4047 Ohm
Lead Channel Impedance Value: 4047 Ohm
Lead Channel Impedance Value: 4047 Ohm
Lead Channel Impedance Value: 4047 Ohm
Lead Channel Impedance Value: 4047 Ohm
Lead Channel Impedance Value: 4047 Ohm
Lead Channel Impedance Value: 4047 Ohm
Lead Channel Impedance Value: 4047 Ohm
Lead Channel Impedance Value: 418 Ohm
Lead Channel Impedance Value: 513 Ohm
Lead Channel Impedance Value: 532 Ohm
Lead Channel Pacing Threshold Amplitude: 0.5 V
Lead Channel Pacing Threshold Amplitude: 0.625 V
Lead Channel Pacing Threshold Pulse Width: 0.4 ms
Lead Channel Pacing Threshold Pulse Width: 0.4 ms
Lead Channel Sensing Intrinsic Amplitude: 1.875 mV
Lead Channel Sensing Intrinsic Amplitude: 1.875 mV
Lead Channel Sensing Intrinsic Amplitude: 3.75 mV
Lead Channel Sensing Intrinsic Amplitude: 3.75 mV
Lead Channel Setting Pacing Amplitude: 2 V
Lead Channel Setting Pacing Amplitude: 2.5 V
Lead Channel Setting Pacing Pulse Width: 0.4 ms
Lead Channel Setting Sensing Sensitivity: 0.3 mV

## 2017-06-06 ENCOUNTER — Telehealth: Payer: Self-pay

## 2017-06-06 DIAGNOSIS — S60454A Superficial foreign body of right ring finger, initial encounter: Secondary | ICD-10-CM | POA: Diagnosis not present

## 2017-06-06 NOTE — Telephone Encounter (Signed)
   [facility] Pre-operative Risk Assessment    Request for surgical clearance:  1. What type of surgery is being performed? REMOVAL FOREIGN BODY RIGHT RING FINGER   2. When is this surgery scheduled? 06/21/17   3. What type of clearance is required (medical clearance vs. Pharmacy clearance to hold med vs. Both)? BOTH  4. Are there any medications that need to be held prior to surgery and how long? NONE LISTED   5. Practice name and name of physician performing surgery? [facility] - DR. KEVIN KUZMA  6. What is your office phone number [phone]    7.   What is your office fax number [phone]  8.   Anesthesia type (None, local, MAC, general) ? NONE LISTED   [name] 06/06/2017, 5:04 PM  _________________________________________________________________   (provider comments below)

## 2017-06-07 ENCOUNTER — Other Ambulatory Visit: Payer: Self-pay | Admitting: Orthopedic Surgery

## 2017-06-08 NOTE — Telephone Encounter (Signed)
[facility] Should not be general anesthesia, they usually prefer IV regional anesthesia unless otherwise specified - Dr. Leanora Cover listed it as "choice anesthesia"  Request on how to hold ASA

## 2017-06-08 NOTE — Telephone Encounter (Signed)
   [facility] [place], MD  Chart reviewed as part of pre-operative protocol coverage. [facility] staff, can you please get more information about what they need clearance for? Is this an office procedure, or a surgery? No anesthesia details were included in the request. Do they need any specific input on imaging restrictions? Pt last seen 01/2017 and has device.  Charlie Pitter, PA-C 06/08/2017, 2:36 PM

## 2017-06-12 DIAGNOSIS — H43813 Vitreous degeneration, bilateral: Secondary | ICD-10-CM | POA: Diagnosis not present

## 2017-06-12 DIAGNOSIS — H35372 Puckering of macula, left eye: Secondary | ICD-10-CM | POA: Diagnosis not present

## 2017-06-12 DIAGNOSIS — E113393 Type 2 diabetes mellitus with moderate nonproliferative diabetic retinopathy without macular edema, bilateral: Secondary | ICD-10-CM | POA: Diagnosis not present

## 2017-06-12 DIAGNOSIS — H35043 Retinal micro-aneurysms, unspecified, bilateral: Secondary | ICD-10-CM | POA: Diagnosis not present

## 2017-06-12 NOTE — Telephone Encounter (Signed)
Chart reviewed. Pt ok to proceed with low-risk noncardiac surgery. He can hold ASA if necessary 5-7 days, but not sure that it's necessary to hold for surgery on his finger. Will leave up to the surgeon.

## 2017-06-12 NOTE — Telephone Encounter (Signed)
Faxed via Epic to Dr. Fredna Dow Fax: [phone]

## 2017-06-20 ENCOUNTER — Encounter (HOSPITAL_COMMUNITY): Payer: Self-pay

## 2017-06-20 ENCOUNTER — Encounter (HOSPITAL_COMMUNITY)
Admission: RE | Admit: 2017-06-20 | Discharge: 2017-06-20 | Disposition: A | Discharge status: Home / Self Care | Payer: Medicare Other | Source: Ambulatory Visit | Attending: Orthopedic Surgery | Admitting: Orthopedic Surgery

## 2017-06-20 ENCOUNTER — Other Ambulatory Visit: Payer: Self-pay

## 2017-06-20 DIAGNOSIS — I2510 Atherosclerotic heart disease of native coronary artery without angina pectoris: Secondary | ICD-10-CM | POA: Diagnosis not present

## 2017-06-20 DIAGNOSIS — I480 Paroxysmal atrial fibrillation: Secondary | ICD-10-CM | POA: Diagnosis not present

## 2017-06-20 DIAGNOSIS — Z79899 Other long term (current) drug therapy: Secondary | ICD-10-CM | POA: Diagnosis not present

## 2017-06-20 DIAGNOSIS — Z7982 Long term (current) use of aspirin: Secondary | ICD-10-CM | POA: Diagnosis not present

## 2017-06-20 DIAGNOSIS — Z7951 Long term (current) use of inhaled steroids: Secondary | ICD-10-CM | POA: Diagnosis not present

## 2017-06-20 DIAGNOSIS — Z95810 Presence of automatic (implantable) cardiac defibrillator: Secondary | ICD-10-CM | POA: Diagnosis not present

## 2017-06-20 DIAGNOSIS — E1151 Type 2 diabetes mellitus with diabetic peripheral angiopathy without gangrene: Secondary | ICD-10-CM | POA: Diagnosis not present

## 2017-06-20 DIAGNOSIS — Z952 Presence of prosthetic heart valve: Secondary | ICD-10-CM | POA: Diagnosis not present

## 2017-06-20 DIAGNOSIS — Z7984 Long term (current) use of oral hypoglycemic drugs: Secondary | ICD-10-CM | POA: Diagnosis not present

## 2017-06-20 DIAGNOSIS — Z791 Long term (current) use of non-steroidal anti-inflammatories (NSAID): Secondary | ICD-10-CM | POA: Diagnosis not present

## 2017-06-20 DIAGNOSIS — M795 Residual foreign body in soft tissue: Secondary | ICD-10-CM | POA: Diagnosis not present

## 2017-06-20 DIAGNOSIS — Z951 Presence of aortocoronary bypass graft: Secondary | ICD-10-CM | POA: Diagnosis not present

## 2017-06-20 DIAGNOSIS — Z87891 Personal history of nicotine dependence: Secondary | ICD-10-CM | POA: Diagnosis not present

## 2017-06-20 DIAGNOSIS — I10 Essential (primary) hypertension: Secondary | ICD-10-CM | POA: Diagnosis not present

## 2017-06-20 HISTORY — DX: Headache, unspecified: R51.9

## 2017-06-20 HISTORY — DX: Headache: R51

## 2017-06-20 HISTORY — DX: Cardiac arrhythmia, unspecified: I49.9

## 2017-06-20 HISTORY — DX: Presence of automatic (implantable) cardiac defibrillator: Z95.810

## 2017-06-20 LAB — CBC
HCT: 40.8 % (ref 39.0–52.0)
Hemoglobin: 13.5 g/dL (ref 13.0–17.0)
MCH: 32.2 pg (ref 26.0–34.0)
MCHC: 33.1 g/dL (ref 30.0–36.0)
MCV: 97.4 fL (ref 78.0–100.0)
Platelets: 157 10*3/uL (ref 150–400)
RBC: 4.19 MIL/uL — ABNORMAL LOW (ref 4.22–5.81)
RDW: 12.7 % (ref 11.5–15.5)
WBC: 4.6 10*3/uL (ref 4.0–10.5)

## 2017-06-20 LAB — BASIC METABOLIC PANEL
Anion gap: 6 (ref 5–15)
BUN: 12 mg/dL (ref 6–20)
CO2: 29 mmol/L (ref 22–32)
Calcium: 9.1 mg/dL (ref 8.9–10.3)
Chloride: 101 mmol/L (ref 101–111)
Creatinine, Ser: 1 mg/dL (ref 0.61–1.24)
GFR calc Af Amer: >60 mL/min (ref >60)
GFR calc non Af Amer: >60 mL/min (ref >60)
Glucose, Bld: 151 mg/dL — ABNORMAL HIGH (ref 65–99)
Potassium: 4.5 mmol/L (ref 3.5–5.1)
Sodium: 136 mmol/L (ref 135–145)

## 2017-06-20 LAB — HEMOGLOBIN A1C
Hgb A1c MFr Bld: 6.1 % — ABNORMAL HIGH (ref 4.8–5.6)
Mean Plasma Glucose: 128.37 mg/dL

## 2017-06-20 LAB — GLUCOSE, CAPILLARY: Glucose-Capillary: 170 mg/dL — ABNORMAL HIGH (ref 65–99)

## 2017-06-20 NOTE — Progress Notes (Signed)
Email sent to Denese Killings with [employer], and Lindsi that patient has a [employer] ICD and is a patient of Dr. Caryl Comes.  Surgery scheduled on rt ring finger with Dr. Fredna Dow 06/21/17 at 0815- patient to arrive at 600 am.

## 2017-06-20 NOTE — Pre-Procedure Instructions (Signed)
[name]  06/20/2017      [facility], [place], [place] - [address] [place] [address] Phone: [phone] Fax: [phone]    Your procedure is scheduled on  Thursday 06/21/17  Report to [facility] Admitting at 600 A.M.  Call this number if you have problems the morning of surgery:  [phone]   Remember:  No food  OR DRINK after midnight.    Take these medicines the morning of surgery with A SIP OF WATER -  FLONASE , METOPROLOL (TOPROL),  [name]  7 days prior to surgery STOP taking any Aspirin(unless otherwise instructed by your surgeon), Aleve, Naproxen, Ibuprofen, Motrin, Advil, Goody's, BC's, all herbal medications, fish oil, and all vitamins     How to Manage Your Diabetes Before and After Surgery  Why is it important to control my blood sugar before and after surgery? . Improving blood sugar levels before and after surgery helps healing and can limit problems. . A way of improving blood sugar control is eating a healthy diet by: o  Eating less sugar and carbohydrates o  Increasing activity/exercise o  Talking with your doctor about reaching your blood sugar goals . High blood sugars (greater than 180 mg/dL) can raise your risk of infections and slow your recovery, so you will need to focus on controlling your diabetes during the weeks before surgery. . Make sure that the doctor who takes care of your diabetes knows about your planned surgery including the date and location.  How do I manage my blood sugar before surgery? . Check your blood sugar at least 4 times a day, starting 2 days before surgery, to make sure that the level is not too high or low. o Check your blood sugar the morning of your surgery when you wake up and every 2 hours until you get to the Short Stay unit. . If your blood sugar is less than 70 mg/dL, you will need to treat for low blood sugar: o Do not take insulin. o Treat a  low blood sugar (less than 70 mg/dL) with  cup of clear juice (cranberry or apple), 4 glucose tablets, OR glucose gel. Recheck blood sugar in 15 minutes after treatment (to make sure it is greater than 70 mg/dL). If your blood sugar is not greater than 70 mg/dL on recheck, call [phone] o  for further instructions. . Report your blood sugar to the short stay nurse when you get to Short Stay.  . If you are admitted to the hospital after surgery: o Your blood sugar will be checked by the staff and you will probably be given insulin after surgery (instead of oral diabetes medicines) to make sure you have good blood sugar levels. o The goal for blood sugar control after surgery is 80-180 mg/dL.              WHAT DO I DO ABOUT MY DIABETES MEDICATION?   Marland Kitchen Do not take oral diabetes medicines (pills) the morning of surgery.  .   Other Instructions:          Patient Signature:  Date:   Nurse Signature:  Date:   Reviewed and Endorsed by [facility] Patient Education Committee, August 2015  Do not wear jewelry, make-up or nail polish.  Do not wear lotions, powders, or perfumes, or deodorant.  Do not shave 48 hours prior to surgery.  Men may shave  face and neck.  Do not bring valuables to the hospital.  [facility] is not responsible for any belongings or valuables.  Contacts, dentures or bridgework may not be worn into surgery.  Leave your suitcase in the car.  After surgery it may be brought to your room.  For patients admitted to the hospital, discharge time will be determined by your treatment team.  Patients discharged the day of surgery will not be allowed to drive home.   Name and phone number of your driver:    Special instructions:  [facility] - Preparing for Surgery  Before surgery, you can play an important role.  Because skin is not sterile, your skin needs to be as free of germs as possible.  You can reduce the number of germs on you skin by washing with  CHG (chlorahexidine gluconate) soap before surgery.  CHG is an antiseptic cleaner which kills germs and bonds with the skin to continue killing germs even after washing.  Oral Hygiene is also important in reducing the risk of infection.  Remember to brush your teeth with your regular toothpaste the morning of surgery.  Please DO NOT use if you have an allergy to CHG or antibacterial soaps.  If your skin becomes reddened/irritated stop using the CHG and inform your nurse when you arrive at Short Stay.  Do not shave (including legs and underarms) for at least 48 hours prior to the first CHG shower.  You may shave your face.  Please follow these instructions carefully:   1.  Shower with CHG Soap the night before surgery and the morning of Surgery.  2.  If you choose to wash your hair, wash your hair first as usual with your normal shampoo.  3.  After you shampoo, rinse your hair and body thoroughly to remove the shampoo. 4.  Use CHG as you would any other liquid soap.  You can apply chg directly to the skin and wash gently with a      scrungie or washcloth.           5.  Apply the CHG Soap to your body ONLY FROM THE NECK DOWN.   Do not use on open wounds or open sores. Avoid contact with your eyes, ears, mouth and genitals (private parts).  Wash genitals (private parts) with your normal soap.  6.  Wash thoroughly, paying special attention to the area where your surgery will be performed.  7.  Thoroughly rinse your body with warm water from the neck down.  8.  DO NOT shower/wash with your normal soap after using and rinsing off the CHG Soap.  9.  Pat yourself dry with a clean towel.            10.  Wear clean pajamas.            11.  Place clean sheets on your bed the night of your first shower and do not sleep with pets.  Day of Surgery  Do not apply any lotions/deoderants the morning of surgery.   Please wear clean clothes to the hospital/surgery center. Remember to brush your teeth with  toothpaste.     Please read over the following fact sheets that you were given. Pain Booklet

## 2017-06-21 ENCOUNTER — Ambulatory Visit (HOSPITAL_COMMUNITY): Payer: Medicare Other | Admitting: Certified Registered Nurse Anesthetist

## 2017-06-21 ENCOUNTER — Ambulatory Visit (HOSPITAL_COMMUNITY)
Admission: RE | Admit: 2017-06-21 | Discharge: 2017-06-21 | Disposition: A | Discharge status: Home / Self Care | Payer: Medicare Other | Source: Ambulatory Visit | Attending: Orthopedic Surgery | Admitting: Orthopedic Surgery

## 2017-06-21 ENCOUNTER — Encounter (HOSPITAL_COMMUNITY): Payer: Self-pay | Admitting: Certified Registered Nurse Anesthetist

## 2017-06-21 ENCOUNTER — Encounter (HOSPITAL_COMMUNITY)
Admission: RE | Disposition: A | Discharge status: Home / Self Care | Payer: Self-pay | Source: Ambulatory Visit | Attending: Orthopedic Surgery

## 2017-06-21 DIAGNOSIS — Z7982 Long term (current) use of aspirin: Secondary | ICD-10-CM | POA: Diagnosis not present

## 2017-06-21 DIAGNOSIS — I10 Essential (primary) hypertension: Secondary | ICD-10-CM | POA: Diagnosis not present

## 2017-06-21 DIAGNOSIS — Z87891 Personal history of nicotine dependence: Secondary | ICD-10-CM | POA: Diagnosis not present

## 2017-06-21 DIAGNOSIS — I2510 Atherosclerotic heart disease of native coronary artery without angina pectoris: Secondary | ICD-10-CM | POA: Insufficient documentation

## 2017-06-21 DIAGNOSIS — M795 Residual foreign body in soft tissue: Secondary | ICD-10-CM | POA: Diagnosis not present

## 2017-06-21 DIAGNOSIS — Z951 Presence of aortocoronary bypass graft: Secondary | ICD-10-CM | POA: Diagnosis not present

## 2017-06-21 DIAGNOSIS — I480 Paroxysmal atrial fibrillation: Secondary | ICD-10-CM | POA: Insufficient documentation

## 2017-06-21 DIAGNOSIS — Z7951 Long term (current) use of inhaled steroids: Secondary | ICD-10-CM | POA: Insufficient documentation

## 2017-06-21 DIAGNOSIS — Z791 Long term (current) use of non-steroidal anti-inflammatories (NSAID): Secondary | ICD-10-CM | POA: Insufficient documentation

## 2017-06-21 DIAGNOSIS — Z79899 Other long term (current) drug therapy: Secondary | ICD-10-CM | POA: Diagnosis not present

## 2017-06-21 DIAGNOSIS — Z952 Presence of prosthetic heart valve: Secondary | ICD-10-CM | POA: Diagnosis not present

## 2017-06-21 DIAGNOSIS — Z95810 Presence of automatic (implantable) cardiac defibrillator: Secondary | ICD-10-CM | POA: Insufficient documentation

## 2017-06-21 DIAGNOSIS — E1151 Type 2 diabetes mellitus with diabetic peripheral angiopathy without gangrene: Secondary | ICD-10-CM | POA: Insufficient documentation

## 2017-06-21 DIAGNOSIS — Z7984 Long term (current) use of oral hypoglycemic drugs: Secondary | ICD-10-CM | POA: Insufficient documentation

## 2017-06-21 DIAGNOSIS — S60454A Superficial foreign body of right ring finger, initial encounter: Secondary | ICD-10-CM | POA: Diagnosis not present

## 2017-06-21 HISTORY — PX: FOREIGN BODY REMOVAL: SHX962

## 2017-06-21 LAB — GLUCOSE, CAPILLARY
Glucose-Capillary: 123 mg/dL — ABNORMAL HIGH (ref 65–99)
Glucose-Capillary: 89 mg/dL (ref 65–99)
Glucose-Capillary: 102 mg/dL — ABNORMAL HIGH (ref 65–99)

## 2017-06-21 SURGERY — FOREIGN BODY REMOVAL ADULT
Anesthesia: Monitor Anesthesia Care | Site: Ring Finger | Laterality: Right

## 2017-06-21 MED ORDER — BUPIVACAINE HCL 0.25 % IJ SOLN
INTRAMUSCULAR | Status: DC | PRN
  Administered 2017-06-21: 10 mL

## 2017-06-21 MED ORDER — 0.9 % SODIUM CHLORIDE (POUR BTL) OPTIME
TOPICAL | Status: DC | PRN
  Administered 2017-06-21: 1000 mL

## 2017-06-21 MED ORDER — HYDROCODONE-ACETAMINOPHEN 5-325 MG PO TABS: ORAL_TABLET | ORAL | 0 refills | Status: DC

## 2017-06-21 MED ORDER — BUPIVACAINE HCL (PF) 0.25 % IJ SOLN
INTRAMUSCULAR | Status: AC
  Filled 2017-06-21: qty 30

## 2017-06-21 MED ORDER — ONDANSETRON HCL 4 MG/2ML IJ SOLN
INTRAMUSCULAR | Status: AC
  Filled 2017-06-21: qty 2

## 2017-06-21 MED ORDER — PROPOFOL 10 MG/ML IV BOLUS
INTRAVENOUS | Status: AC
  Filled 2017-06-21: qty 20

## 2017-06-21 MED ORDER — CEFAZOLIN SODIUM-DEXTROSE 2-4 GM/100ML-% IV SOLN
INTRAVENOUS | Status: AC
  Filled 2017-06-21: qty 100

## 2017-06-21 MED ORDER — LIDOCAINE HCL (PF) 0.5 % IJ SOLN
INTRAMUSCULAR | Status: DC | PRN
  Administered 2017-06-21: 50 mL via INTRAVENOUS

## 2017-06-21 MED ORDER — CEFAZOLIN SODIUM-DEXTROSE 2-4 GM/100ML-% IV SOLN
2.0000 g | INTRAVENOUS | Status: AC
  Administered 2017-06-21: 2 g via INTRAVENOUS

## 2017-06-21 MED ORDER — FENTANYL CITRATE (PF) 250 MCG/5ML IJ SOLN
INTRAMUSCULAR | Status: AC
  Filled 2017-06-21: qty 5

## 2017-06-21 MED ORDER — SULFAMETHOXAZOLE-TRIMETHOPRIM 800-160 MG PO TABS: 1.0000 | ORAL_TABLET | Freq: Two times a day (BID) | ORAL | 0 refills | Status: DC

## 2017-06-21 MED ORDER — LACTATED RINGERS IV SOLN
INTRAVENOUS | Status: DC
  Administered 2017-06-21 (×2): via INTRAVENOUS

## 2017-06-21 MED ORDER — DEXAMETHASONE SODIUM PHOSPHATE 10 MG/ML IJ SOLN
INTRAMUSCULAR | Status: AC
  Filled 2017-06-21: qty 3

## 2017-06-21 MED ORDER — SUCCINYLCHOLINE CHLORIDE 200 MG/10ML IV SOSY
PREFILLED_SYRINGE | INTRAVENOUS | Status: AC
  Filled 2017-06-21: qty 10

## 2017-06-21 MED ORDER — PROPOFOL 500 MG/50ML IV EMUL
INTRAVENOUS | Status: DC | PRN
  Administered 2017-06-21: 50 ug/kg/min via INTRAVENOUS

## 2017-06-21 MED ORDER — CHLORHEXIDINE GLUCONATE 4 % EX LIQD: 60.0000 mL | Freq: Once | CUTANEOUS | Status: DC

## 2017-06-21 MED ORDER — FENTANYL CITRATE (PF) 100 MCG/2ML IJ SOLN
INTRAMUSCULAR | Status: DC | PRN
  Administered 2017-06-21: 50 ug via INTRAVENOUS

## 2017-06-21 SURGICAL SUPPLY — 38 items
BANDAGE ACE 4X5 VEL STRL LF (GAUZE/BANDAGES/DRESSINGS) ×1 IMPLANT
BNDG CMPR 9X4 STRL LF SNTH (GAUZE/BANDAGES/DRESSINGS)
BNDG COHESIVE 1X5 TAN STRL LF (GAUZE/BANDAGES/DRESSINGS) ×2 IMPLANT
BNDG ESMARK 4X9 LF (GAUZE/BANDAGES/DRESSINGS) ×1 IMPLANT
BNDG GAUZE ELAST 4 BULKY (GAUZE/BANDAGES/DRESSINGS) ×1 IMPLANT
CORDS BIPOLAR (ELECTRODE) ×1 IMPLANT
DRSG ADAPTIC 3X8 NADH LF (GAUZE/BANDAGES/DRESSINGS) ×1 IMPLANT
DRSG EMULSION OIL 3X3 NADH (GAUZE/BANDAGES/DRESSINGS) IMPLANT
DRSG PAD ABDOMINAL 8X10 ST (GAUZE/BANDAGES/DRESSINGS) IMPLANT
GAUZE SPONGE 4X4 12PLY STRL (GAUZE/BANDAGES/DRESSINGS) ×1 IMPLANT
GAUZE SPONGE 4X4 12PLY STRL LF (GAUZE/BANDAGES/DRESSINGS) ×2 IMPLANT
GAUZE XEROFORM 1X8 LF (GAUZE/BANDAGES/DRESSINGS) ×2 IMPLANT
GLOVE BIOGEL PI IND STRL 7.0 (GLOVE) ×1 IMPLANT
GLOVE BIOGEL PI INDICATOR 7.0 (GLOVE) ×1
GLOVE SURG SS PI 7.0 STRL IVOR (GLOVE) ×1 IMPLANT
GOWN STRL REUS W/ TWL LRG LVL3 (GOWN DISPOSABLE) ×1 IMPLANT
GOWN STRL REUS W/TWL LRG LVL3 (GOWN DISPOSABLE) ×4
HANDPIECE INTERPULSE COAX TIP (DISPOSABLE)
KIT BASIN OR (CUSTOM PROCEDURE TRAY) ×2 IMPLANT
KIT TURNOVER KIT B (KITS) ×2 IMPLANT
LOOP VESSEL MAXI BLUE (MISCELLANEOUS) ×2 IMPLANT
MANIFOLD NEPTUNE II (INSTRUMENTS) IMPLANT
NS IRRIG 1000ML POUR BTL (IV SOLUTION) ×2 IMPLANT
PACK ORTHO EXTREMITY (CUSTOM PROCEDURE TRAY) ×2 IMPLANT
PAD ARMBOARD 7.5X6 YLW CONV (MISCELLANEOUS) ×4 IMPLANT
SET HNDPC FAN SPRY TIP SCT (DISPOSABLE) IMPLANT
SPONGE LAP 18X18 X RAY DECT (DISPOSABLE) ×2 IMPLANT
SPONGE LAP 4X18 X RAY DECT (DISPOSABLE) ×2 IMPLANT
SUT ETHILON 4 0 PS 2 18 (SUTURE) ×2 IMPLANT
SWAB COLLECTION DEVICE MRSA (MISCELLANEOUS) IMPLANT
SWAB CULTURE ESWAB REG 1ML (MISCELLANEOUS) IMPLANT
TOWEL OR 17X24 6PK STRL BLUE (TOWEL DISPOSABLE) ×2 IMPLANT
TOWEL OR 17X26 10 PK STRL BLUE (TOWEL DISPOSABLE) ×2 IMPLANT
TUBE CONNECTING 12X1/4 (SUCTIONS) ×2 IMPLANT
TUBE FEEDING ENTERAL 5FR 16IN (TUBING) IMPLANT
UNDERPAD 30X30 (UNDERPADS AND DIAPERS) ×2 IMPLANT
WATER STERILE IRR 1000ML POUR (IV SOLUTION) ×2 IMPLANT
YANKAUER SUCT BULB TIP NO VENT (SUCTIONS) ×2 IMPLANT

## 2017-06-21 NOTE — Discharge Instructions (Addendum)

## 2017-06-21 NOTE — Op Note (Signed)
NAME: [name], [name] MEDICAL RECORD IR:[id number] ACCOUNT [id number] DATE OF BIRTH:Jun 23, 1931 FACILITY: [facility] LOCATION: [place], [place]  OPERATIVE REPORT  DATE OF PROCEDURE:  06/21/2017  PREOPERATIVE DIAGNOSIS:  Right ring finger foreign body.  POSTOPERATIVE DIAGNOSIS:  Right ring finger foreign body.  PROCEDURE:  Right ring finger removal of foreign body.  SURGEON:  Leanora Cover, M.D.  ASSISTANT:  None.  ANESTHESIA:  Bier block with sedation.  IV FLUIDS:  Per anesthesia flow sheet.  ESTIMATED BLOOD LOSS:  Minimal.  COMPLICATIONS:  None.  SPECIMENS:  Cultures to micro.  TOURNIQUET TIME:  Approximately 30 minutes.  DISPOSITION:  Stable to PACU.  INDICATIONS:  The patient is an 84 year old male who states he got a wood splinter in his right ring finger approximately 6 months ago.  This has been bothersome to him.  He wished to have it removed.  Risks, benefits and alternatives of surgery were  discussed including the risks of blood loss, infection, damage to nerves, vessels, tendons, ligaments, bone for surgery, need for additional surgery, complications with wound healing, continued pain an retained foreign body.  He voiced understanding of  these risks and elected to proceed  After being identified preoperatively by myself, the patient agreed upon the procedure and site of procedure.  Sterile site was marked.  Risks, benefits and alternatives of surgery were reviewed and they wished to  proceed.  Surgical consent was signed.  He was given IV Ancef as preoperative antibiotic prophylaxis.  He was transferred to the operating room and placed on the operating table in supine position with the right upper extremity on an arm board.  Bier  block anesthesia was induced by anesthesiologist.  Right upper extremity was prepped and draped in normal sterile orthopedic fashion.  A surgical pause was performed between the surgeon, anesthesia, and operating room staff  and all were in agreement with  the patient, procedure and site.  DESCRIPTION OF PROCEDURE:  Tourniquet the proximal aspect of the extremity had been inflated for the Bier block.  Incision was made in a [place] type fashion at the volar aspect of the distal phalanx over the palpable foreign body.  This was carried in  subcutaneous tissues by spreading technique.  A small amount of exudate versus purulent material was encountered.  Cultures were taken and sent to micro.  A wood splinter was located and removed.  No remaining foreign body was noted.  The area of the  splinter and exudative material was debrided lightly with the rongeurs.  It was copiously irrigated with sterile saline.  The skin was closed with 4-0 nylon in a horizontal mattress fashion except for the proximal portion from which a vessel loop drain  was left.  A digital block had been performed at the beginning of the case with 0.25% plain Marcaine.  The wound was dressed with sterile Xeroform, 4 x 4's, and wrapped with a Coban dressing lightly.  Tourniquet was deflated at approximately 30 minutes.   Fingertips were pink with brisk capillary refill after deflation of the tourniquet.  The operative drapes were broken down.  The patient was awoken from anesthesia safely.    He was transferred back to the stretcher and taken to PACU in stable condition.  I will see him back in the office in approximately 1 week for postoperative followup.  I will give him Norco 5/325, 1 to 2 p.o. q.6 hours p.r.n. pain, dispensed #20 and  Bactrim-DS 1 p.o. b.i.d. x7 days.  AN/NUANCE  D:06/21/2017  T:06/21/2017 JOB:[id number]

## 2017-06-21 NOTE — Anesthesia Procedure Notes (Deleted)
Anesthesia Regional Block: Laterality: Right      Narrative:

## 2017-06-21 NOTE — Brief Op Note (Signed)
06/21/2017  9:30 AM  PATIENT:  [name]  84 y.o. male  PRE-OPERATIVE DIAGNOSIS:  right ring foreign body  POST-OPERATIVE DIAGNOSIS:  right ring foreign body  PROCEDURE:  Procedure(s): FOREIGN BODY REMOVAL ADULT RIGHT RING FINGER (Right)  SURGEON:  Surgeon(s) and Role:    Leanora Cover, MD - Primary  PHYSICIAN ASSISTANT:   ASSISTANTS: none   ANESTHESIA:   Bier block  EBL:  Minimal   BLOOD ADMINISTERED:none  DRAINS: vessel loop drain  LOCAL MEDICATIONS USED:  MARCAINE     SPECIMEN:  Source of Specimen:  right ring finger  DISPOSITION OF SPECIMEN:  micro  COUNTS:  YES  TOURNIQUET:  Right arm: ~30 minutes at 270 mmHg  DICTATION: .Other Dictation: Dictation Number [id number]  PLAN OF CARE: Discharge to home after PACU  PATIENT DISPOSITION:  PACU - hemodynamically stable.

## 2017-06-21 NOTE — Op Note (Signed)
[id number] 

## 2017-06-21 NOTE — Anesthesia Procedure Notes (Signed)
Procedure Name: MAC Date/Time: 06/21/2017 8:56 AM Performed by: Carney Living, CRNA Pre-anesthesia Checklist: Patient identified, Emergency Drugs available, Suction available, Patient being monitored and Timeout performed Patient Re-evaluated:Patient Re-evaluated prior to induction Oxygen Delivery Method: Simple face mask

## 2017-06-21 NOTE — Anesthesia Procedure Notes (Signed)
Anesthesia Regional Block: Bier block (IV Regional)   Pre-Anesthetic Checklist: ,, timeout performed, Correct Patient, Correct Site, Correct Laterality, Correct Procedure, Correct Position, site marked, Risks and benefits discussed, Surgical consent,  Pre-op evaluation,  At surgeon's request  Laterality: Right  Prep: chloraprep       Needles:  Injection technique: Single-shot      Additional Needles:   Narrative:  Start time: 06/21/2017 9:02 AM End time: 06/21/2017 9:04 AM  Performed by: With CRNAs   Additional Notes: 50 ml slow IV injection of 0.5% PF lidocaine by Dr Orene Desanctis, VSS

## 2017-06-21 NOTE — H&P (Signed)
[name] is an 84 y.o. male.   Chief Complaint: right ring finger foreign body HPI: 84 yo male with foreign body right ring finger x 6 months.  It is bothersome to him.  He wishes to have it removed.  Allergies:  Allergies  Allergen Reactions  . Latex Other (See Comments)    REDNESS AT REACTION SITE.  Marland Kitchen Metformin Diarrhea  . Rivaroxaban Other (See Comments)    Headaches, blurred vision  . Other Other (See Comments)    BEE STINGS   . Sotalol Other (See Comments)    "BLUE TOES"- cut his circulation off  . Warfarin Sodium Other (See Comments)    REACTION: migraine headaches/vision impariment    Past Medical History:  Diagnosis Date  . AICD (automatic cardioverter/defibrillator) present    Medtronicn PPM/ICD  . [place] artery [place]    [place] [facility]  . Diabetes mellitus   . Dysrhythmia   . Endocarditis, valve unspecified    Mitral  . Headache    hx migraines 6-7 x mo  . History of migraine headaches   . History of seasonal allergies   . ICD (implantable cardiac defibrillator) in place   . PAF (paroxysmal atrial fibrillation) ([place])    NO LAA occlusion at the time of surgery  [name] 2018  . Pleural effusion   . PVC's (premature ventricular contractions)   . Ventricular tachycardia, polymorphic ([place])    status post ICD implantation    Past Surgical History:  Procedure Laterality Date  . CARDIAC CATHETERIZATION  04/03/2007   EF 40%  . CARDIAC CATHETERIZATION  02/06/2006   EF 45%. ANTERIOR HYPOKINESIS  . CARDIAC CATHETERIZATION  05/29/2000   EF 50%. MILD ANTERIOR HYPOKINESIS  . CARDIAC CATHETERIZATION  10/25/1999   EF 50%. SEVERE MITRAL REGURGITATION  . CARDIAC CATHETERIZATION  01/06/2003   EF 50%  . CARDIAC DEFIBRILLATOR PLACEMENT    . CATARACT EXTRACTION W/ INTRAOCULAR LENS  IMPLANT, BILATERAL    . CORONARY ARTERY BYPASS GRAFT  2001   2 VESSEL CABG AND MITRAL VALVE REPAIR. HE HAD A LIMA GRAFT TO THE LAD AND RADIAL ARTERY  GRAFT TO THE  OBTUSE MARGINAL  OF LEFT CIRCUMFLEX  . EP IMPLANTABLE DEVICE N/A 11/06/2014   Procedure: ICD Generator Changeout;  Surgeon: Deboraha Sprang, MD;  Location: [place] [facility];  Service: Cardiovascular;  Laterality: N/A;  . HEMORRHOID SURGERY    . HEMORROIDECTOMY    . INTRAVASCULAR PRESSURE WIRE/FFR STUDY N/A 01/05/2017   Procedure: INTRAVASCULAR PRESSURE WIRE/FFR STUDY;  Surgeon: Sherren Mocha, MD;  Location: [place] [facility];  Service: Cardiovascular;  Laterality: N/A;  . KNEE ARTHROSCOPY     RIGHT KNEE  . LEFT HEART CATHETERIZATION WITH CORONARY ANGIOGRAM N/A 07/04/2012   Procedure: LEFT HEART CATHETERIZATION WITH CORONARY ANGIOGRAM;  Surgeon: Sherren Mocha, MD;  Location: [facility];  Service: Cardiovascular;  Laterality: N/A;  . MITRAL VALVE REPLACEMENT     No LAA occlusion at the time of surgery  Karie Soda report 2018  . RIGHT/LEFT HEART CATH AND CORONARY/GRAFT ANGIOGRAPHY N/A 01/05/2017   Procedure: RIGHT/LEFT HEART CATH AND CORONARY/GRAFT ANGIOGRAPHY;  Surgeon: Sherren Mocha, MD;  Location: [place] [facility];  Service: Cardiovascular;  Laterality: N/A;  . TRANSTHORACIC ECHOCARDIOGRAM  04/02/2007   EF 35%    Family History: Family History  Problem Relation Age of Onset  . Heart disease Father   . Emphysema Father   . Stroke Mother   . Cancer Sister  breast cancer  . Heart disease Paternal Grandmother   . Heart disease Paternal Grandfather   . Diabetes Other        grandmother, aunt, uncle  . Cancer Other        lung cancer, aunt    Social History:   reports that he quit smoking about 34 years ago. He has never used smokeless tobacco. He reports that he drinks alcohol. He reports that he does not use drugs.  Medications: Medications Prior to Admission  Medication Sig Dispense Refill  . aspirin 81 MG chewable tablet Chew 81 mg by mouth every evening.     Marland Kitchen atorvastatin (LIPITOR) 80 MG tablet Take 80 mg by mouth at bedtime.     . Cholecalciferol (VITAMIN D-3) 5000  UNITS TABS Take 5,000-10,000 Units by mouth See admin instructions. Take 5000 units by mouth on Sunday, Tuesday, Thursday, and Saturday. Take 10000 units by mouth on Monday, Wednesday, and Friday    . EPIPEN 2-PAK 0.3 MG/0.3ML SOAJ injection Inject 0.3 mg into the muscle once. USE ONLY IN EMERGENCY    . fluticasone (FLONASE) 50 MCG/ACT nasal spray Place 1 spray into the nose daily as needed for rhinitis or allergies.    Marland Kitchen ibuprofen (ADVIL,MOTRIN) 200 MG tablet Take 400 mg by mouth every 8 (eight) hours as needed for headache or moderate pain.     . Magnesium 250 MG TABS Take 250 mg by mouth daily.     . metoprolol succinate (TOPROL-XL) 50 MG 24 hr tablet TAKE ONE TABLET BY MOUTH DAILY IMMEDIATELY FOLLOWING A MEAL 90 tablet 3  . Multiple Vitamin (MULTIVITAMIN WITH MINERALS) TABS Take 1 tablet by mouth daily. Centrum Silver    . Multiple Vitamins-Minerals (PRESERVISION AREDS 2) CAPS Take 1 capsule by mouth 2 (two) times daily.     . nateglinide (STARLIX) 120 MG tablet Take 120 mg by mouth Twice daily.     . pioglitazone (ACTOS) 45 MG tablet Take 45 mg by mouth daily.      Marland Kitchen Propylhexedrine (BENZEDREX) INHA Place 1 each into the nose daily as needed (for congestion).    . SF 5000 PLUS 1.1 % CREA dental cream Place 1 application onto teeth at bedtime.   6  . sitaGLIPtin (JANUVIA) 100 MG tablet Take 100 mg by mouth at bedtime.     Marland Kitchen [name] 500 MCG capsule Take 1 capsule (500 mcg total) 2 (two) times daily by mouth. 180 capsule 3    Results for orders placed or performed during the hospital encounter of 06/21/17 (from the past 48 hour(s))  Glucose, capillary     Status: Abnormal   Collection Time: 06/21/17  6:20 AM  Result Value Ref Range   Glucose-Capillary 123 (H) 65 - 99 mg/dL   Comment 1 Notify RN    Comment 2 Document in Chart     No results found.   A comprehensive review of systems was negative.  Blood pressure 110/74, pulse 66, temperature 98.5 F (36.9 C), temperature source Oral,  resp. rate 20, weight 96.6 kg (213 lb), SpO2 97 %.  General appearance: alert, cooperative and appears stated age Head: Normocephalic, without obvious abnormality, atraumatic Neck: supple, symmetrical, trachea midline Cardio: regular rate and rhythm Resp: clear to auscultation bilaterally Extremities: Intact sensation and capillary refill all digits.  +epl/fpl/io.  No wounds.  Pulses: 2+ and symmetric Skin: Skin color, texture, turgor normal. No rashes or lesions Neurologic: Grossly normal Incision/Wound: none  Assessment/Plan Right ring finger foreign body.  Non  operative and operative treatment options were discussed with the patient and patient wishes to proceed with operative treatment. Risks, benefits, and alternatives of surgery were discussed and the patient agrees with the plan of care.   Sully Manzi R 06/21/2017, 8:36 AM

## 2017-06-21 NOTE — Anesthesia Postprocedure Evaluation (Signed)
Anesthesia Post Note  Patient: [name]  Procedure(s) Performed: FOREIGN BODY REMOVAL ADULT RIGHT RING FINGER (Right Ring Finger)     Patient location during evaluation: PACU Anesthesia Type: Regional Level of consciousness: awake and alert Pain management: pain level controlled Vital Signs Assessment: post-procedure vital signs reviewed and stable Respiratory status: spontaneous breathing, nonlabored ventilation, respiratory function stable and patient connected to nasal cannula oxygen Cardiovascular status: stable and blood pressure returned to baseline Postop Assessment: no apparent nausea or vomiting Anesthetic complications: no    Last Vitals:  Vitals:   06/21/17 0950 06/21/17 0957  BP:  135/71  Pulse: 61 63  Resp: 16 16  Temp:    SpO2: 95% 98%    Last Pain:  Vitals:   06/21/17 0957  TempSrc:   PainSc: 0-No pain                 [name],JAMES TERRILL

## 2017-06-21 NOTE — Transfer of Care (Signed)
Immediate Anesthesia Transfer of Care Note  Patient: [name]  Procedure(s) Performed: FOREIGN BODY REMOVAL ADULT RIGHT RING FINGER (Right Ring Finger)  Patient Location: PACU  Anesthesia Type:MAC, Regional and Bier block  Level of Consciousness: awake, alert , oriented and patient cooperative  Airway & Oxygen Therapy: Patient Spontanous Breathing and Patient connected to nasal cannula oxygen  Post-op Assessment: Report given to RN, Post -op Vital signs reviewed and stable and Patient moving all extremities X 4  Post vital signs: Reviewed and stable  Last Vitals:  Vitals Value Taken Time  BP    Temp    Pulse 63 06/21/2017  9:46 AM  Resp 17 06/21/2017  9:46 AM  SpO2 98 % 06/21/2017  9:46 AM  Vitals shown include unvalidated device data.  Last Pain:  Vitals:   06/21/17 0723  TempSrc:   PainSc: 0-No pain         Complications: No apparent anesthesia complications

## 2017-06-21 NOTE — Anesthesia Preprocedure Evaluation (Addendum)
Anesthesia Evaluation  Patient identified by MRN, date of birth, ID band  Reviewed: Allergy & Precautions, Patient's Chart, lab work & pertinent test results  Airway Mallampati: II  TM Distance: >3 FB Neck ROM: Full    Dental  (+) Teeth Intact, Dental Advisory Given   Pulmonary former smoker,    breath sounds clear to auscultation       Cardiovascular hypertension, Pt. on medications and Pt. on home beta blockers + angina with exertion + CAD and + Peripheral Vascular Disease  + dysrhythmias + Cardiac Defibrillator  Rhythm:Regular Rate:Normal     Neuro/Psych  Headaches,    GI/Hepatic negative GI ROS,   Endo/Other  diabetes, Well Controlled, Type 2  Renal/GU      Musculoskeletal negative musculoskeletal ROS (+)   Abdominal   Peds  Hematology negative hematology ROS (+)   Anesthesia Other Findings   Reproductive/Obstetrics                            Anesthesia Physical Anesthesia Plan  ASA: III  Anesthesia Plan: Bier Block and Bier Block-LIDOCAINE ONLY   Post-op Pain Management:    Induction:   PONV Risk Score and Plan:   Airway Management Planned: Natural Airway and Nasal Cannula  Additional Equipment:   Intra-op Plan:   Post-operative Plan:   Informed Consent:   Dental advisory given  Plan Discussed with: CRNA, Anesthesiologist and Surgeon  Anesthesia Plan Comments:        Anesthesia Quick Evaluation

## 2017-06-22 ENCOUNTER — Encounter (HOSPITAL_COMMUNITY): Payer: Self-pay | Admitting: Orthopedic Surgery

## 2017-06-26 LAB — ANAEROBIC CULTURE

## 2017-07-06 LAB — AEROBIC CULTURE W GRAM STAIN (SUPERFICIAL SPECIMEN)

## 2017-07-06 LAB — MISC LABCORP TEST (SEND OUT): Labcorp test code: 183770

## 2017-07-16 ENCOUNTER — Encounter: Payer: Self-pay | Admitting: Internal Medicine

## 2017-07-20 ENCOUNTER — Telehealth: Payer: Self-pay

## 2017-07-20 NOTE — Telephone Encounter (Signed)
The pt is advised that his [name] has arrived in the office. He states that he will pick up today.

## 2017-08-07 ENCOUNTER — Encounter: Payer: Self-pay | Admitting: Internal Medicine

## 2017-08-07 ENCOUNTER — Ambulatory Visit (INDEPENDENT_AMBULATORY_CARE_PROVIDER_SITE_OTHER): Payer: Medicare Other | Admitting: *Deleted

## 2017-08-07 DIAGNOSIS — I472 Ventricular tachycardia, unspecified: Secondary | ICD-10-CM

## 2017-08-07 NOTE — Progress Notes (Signed)
Remote ICD transmission.   

## 2017-08-20 ENCOUNTER — Encounter: Payer: Self-pay | Admitting: Internal Medicine

## 2017-08-20 ENCOUNTER — Ambulatory Visit (INDEPENDENT_AMBULATORY_CARE_PROVIDER_SITE_OTHER): Payer: Medicare Other | Admitting: Internal Medicine

## 2017-08-20 VITALS — BP 110/72 | HR 83 | Ht 74.0 in | Wt 216.0 lb

## 2017-08-20 DIAGNOSIS — I472 Ventricular tachycardia, unspecified: Secondary | ICD-10-CM

## 2017-08-20 DIAGNOSIS — I255 Ischemic cardiomyopathy: Secondary | ICD-10-CM | POA: Diagnosis not present

## 2017-08-20 NOTE — Progress Notes (Signed)
Patient Care Team: [name], MD as PCP - General (Family Medicine) Sherren Mocha, MD as PCP - Cardiology (Cardiology)   HPI  [name] is a 84 y.o. male Seen in followup for polymorphic ventricular tachycardia for which he has an ICD and atrial fibrillation.  He was seen November 13 because of 2 episodes of polymorphic ventricular tachycardia occurring in the preceding few weeks after having been quiet for months and months. There was no clear inciting event. Beta blocker therapy was gently uptitrated.  No intercurrent symptomatic VT.    He also has a history of coronary disease with prior CABG mitral valve repair both of which were done at [facility] clinic at that time. he also had a PFO closure.  He underwent catheterization 6/14 demonstrating patency of the LIMA to the LAD and RIMA to his obtuse marginal. Ejection fraction is 35-40% 2/17  Echo EF 30%   DATE TEST EF   6/14 LHC 35-40% LIMA/ RIMA patent   2/17 Echo   30 %   12/18 LHC   LIMA-LAD and right radial graft-OM- patent proximal cx stenosis with negative FFR    He Is on dofetilide for his arrhythmias when he was seen last year complaints of increasing exercise intolerance and chest discomfort.   He has frequent PVCs  We had set up automatic DCCV via ICD for recurrent atrial arrhythmia   He has been shocked twice with success.   He asked that he turned off because of frequent and unexpected shocks  He was given the activator so as to be able to deliver shocks on his own.  He found that this was intolerable but he appreciates being able to use the activator to identify atrial fibrillation as atrial fibrillation is associated with impaired exercise tolerance lightheadedness and dizziness.  He remains not on anticoagulation, been averse to such things.  The patient denies chest pain, shortness of breath, nocturnal dyspnea, orthopnea or peripheral edema.  There have been no palpitations, lightheadedness or syncope.      Date Cr K Mg  9/16  0.99 4.2 2.1  12/17  1.08 4.6 2.1  5/19 1.00 4.5 2.2      Device History: STJ dual chamber ICD implanted 2004 for PMVT, ICM; generator change 2010; gen change 2016 (MDT device) History of appropriate therapy: Yes History of AAD therapy: Yes   Past Medical History:  Diagnosis Date  . AICD (automatic cardioverter/defibrillator) present    Medtronicn PPM/ICD  . Coronary artery disease    [place]  [facility]  . Diabetes mellitus   . Dysrhythmia   . Endocarditis, valve unspecified    Mitral  . Headache    hx migraines 6-7 x mo  . History of migraine headaches   . History of seasonal allergies   . ICD (implantable cardiac defibrillator) in place   . PAF (paroxysmal atrial fibrillation) ([place])    NO LAA occlusion at the time of surgery  [name] 2018  . Pleural effusion   . PVC's (premature ventricular contractions)   . Ventricular tachycardia, polymorphic ([place])    status post ICD implantation    Past Surgical History:  Procedure Laterality Date  . CARDIAC CATHETERIZATION  04/03/2007   EF 40%  . CARDIAC CATHETERIZATION  02/06/2006   EF 45%. ANTERIOR HYPOKINESIS  . CARDIAC CATHETERIZATION  05/29/2000   EF 50%. MILD ANTERIOR HYPOKINESIS  . CARDIAC CATHETERIZATION  10/25/1999   EF 50%. SEVERE MITRAL REGURGITATION  . CARDIAC CATHETERIZATION  01/06/2003  EF 50%  . CARDIAC DEFIBRILLATOR PLACEMENT    . CATARACT EXTRACTION W/ INTRAOCULAR LENS  IMPLANT, BILATERAL    . CORONARY ARTERY BYPASS GRAFT  2001   2 VESSEL CABG AND MITRAL VALVE REPAIR. HE HAD A LIMA GRAFT TO THE LAD AND RADIAL ARTERY  GRAFT TO THE OBTUSE MARGINAL  OF LEFT CIRCUMFLEX  . EP IMPLANTABLE DEVICE N/A 11/06/2014   Procedure: ICD Generator Changeout;  Surgeon: Deboraha Sprang, MD;  Location: [place] [facility];  Service: Cardiovascular;  Laterality: N/A;  . FOREIGN BODY REMOVAL Right 06/21/2017   Procedure: FOREIGN BODY REMOVAL ADULT RIGHT RING FINGER;  Surgeon: Leanora Cover, MD;   Location: [place];  Service: Orthopedics;  Laterality: Right;  . HEMORRHOID SURGERY    . HEMORROIDECTOMY    . INTRAVASCULAR PRESSURE WIRE/FFR STUDY N/A 01/05/2017   Procedure: INTRAVASCULAR PRESSURE WIRE/FFR STUDY;  Surgeon: Sherren Mocha, MD;  Location: [place] [facility];  Service: Cardiovascular;  Laterality: N/A;  . KNEE ARTHROSCOPY     RIGHT KNEE  . LEFT HEART CATHETERIZATION WITH CORONARY ANGIOGRAM N/A 07/04/2012   Procedure: LEFT HEART CATHETERIZATION WITH CORONARY ANGIOGRAM;  Surgeon: Sherren Mocha, MD;  Location: [facility] CATH LAB;  Service: Cardiovascular;  Laterality: N/A;  . MITRAL VALVE REPLACEMENT     No LAA occlusion at the time of surgery  Karie Soda report 2018  . RIGHT/LEFT HEART CATH AND CORONARY/GRAFT ANGIOGRAPHY N/A 01/05/2017   Procedure: RIGHT/LEFT HEART CATH AND CORONARY/GRAFT ANGIOGRAPHY;  Surgeon: Sherren Mocha, MD;  Location: [place] [facility];  Service: Cardiovascular;  Laterality: N/A;  . TRANSTHORACIC ECHOCARDIOGRAM  04/02/2007   EF 35%    Current Outpatient Medications  Medication Sig Dispense Refill  . aspirin 81 MG chewable tablet Chew 81 mg by mouth every evening.     Marland Kitchen atorvastatin (LIPITOR) 80 MG tablet Take 80 mg by mouth at bedtime.     . Cholecalciferol (VITAMIN D-3) 5000 UNITS TABS Take 5,000-10,000 Units by mouth See admin instructions. Take 5000 units by mouth on Sunday, Tuesday, Thursday, and Saturday. Take 10000 units by mouth on Monday, Wednesday, and Friday    . EPIPEN 2-PAK 0.3 MG/0.3ML SOAJ injection Inject 0.3 mg into the muscle once. USE ONLY IN EMERGENCY    . fluticasone (FLONASE) 50 MCG/ACT nasal spray Place 1 spray into the nose daily as needed for rhinitis or allergies.    Marland Kitchen ibuprofen (ADVIL,MOTRIN) 200 MG tablet Take 400 mg by mouth every 8 (eight) hours as needed for headache or moderate pain.     . Magnesium 250 MG TABS Take 250 mg by mouth daily.     . metoprolol succinate (TOPROL-XL) 50 MG 24 hr tablet TAKE ONE TABLET BY MOUTH DAILY  IMMEDIATELY FOLLOWING A MEAL 90 tablet 3  . Multiple Vitamin (MULTIVITAMIN WITH MINERALS) TABS Take 1 tablet by mouth daily. Centrum Silver    . Multiple Vitamins-Minerals (PRESERVISION AREDS 2) CAPS Take 1 capsule by mouth 2 (two) times daily.     . nateglinide (STARLIX) 120 MG tablet Take 120 mg by mouth Twice daily.     . pioglitazone (ACTOS) 45 MG tablet Take 45 mg by mouth daily.      Marland Kitchen Propylhexedrine (BENZEDREX) INHA Place 1 each into the nose daily as needed (for congestion).    . SF 5000 PLUS 1.1 % CREA dental cream Place 1 application onto teeth at bedtime.   6  . sitaGLIPtin (JANUVIA) 100 MG tablet Take 100 mg by mouth at bedtime.     Marland Kitchen  [name] 500 MCG capsule Take 1 capsule (500 mcg total) 2 (two) times daily by mouth. 180 capsule 3   No current facility-administered medications for this visit.     Allergies  Allergen Reactions  . Latex Other (See Comments)    REDNESS AT REACTION SITE.  Marland Kitchen Metformin Diarrhea  . Rivaroxaban Other (See Comments)    Headaches, blurred vision  . Other Other (See Comments)    BEE STINGS   . Sotalol Other (See Comments)    "BLUE TOES"- cut his circulation off  . Warfarin Sodium Other (See Comments)    REACTION: migraine headaches/vision impariment    Review of Systems negative except from HPI and PMH  Physical Exam BP 110/72   Pulse 83   Ht 6\' 2"  (1.88 m)   Wt 216 lb (98 kg)   BMI 27.73 kg/m  Well developed and nourished in no acute distress HENT normal Neck supple with JVP-flat Clear Regular rate and rhythm, no murmurs or gallops Abd-soft with active BS No Clubbing cyanosis edema Skin-warm and dry A & Oriented  Grossly normal sensory and motor function   ECG Personally reviewed  P-synchronous/ AV  Pacing   Intervals 20/19/50       Assessment and  Plan  Ischemic heart disease with prior bypass  Ischemic cardiomyopathy ejection 35-40%  Congestive heart failure-chronic-systolic  Implantable defibrillator-Medtronic  The  patient's device was interrogated.  The information was reviewed.  Device was reprogrammed as below  PVCs  Atrial fibrillation   Dofetilide therapy  Ventricular tachycardia nonsustained       Decrease atrial fibrillation burden.  Decreasing ventricular tachycardia/PVCs he was intolerant of ranolazine.  Continue current medication.  No functional limitations.  We will continue him with his current AV delays although programming him MVP may have benefit

## 2017-08-20 NOTE — Patient Instructions (Addendum)
Medication Instructions:  Your physician recommends that you continue on your current medications as directed. Please refer to the Current Medication list given to you today.   Labwork: None ordered   Testing/Procedures: None ordered   Follow-Up: Your physician wants you to follow-up in: 6 months with Amber Seiler,NP and 12 months with Dr Caryl Comes. You will receive a reminder letter in the mail two months in advance. If you don't receive a letter, please call our office to schedule the follow-up appointment.   Remote monitoring is used to monitor your  ICD from home. This monitoring reduces the number of office visits required to check your device to one time per year. It allows [name] to keep an eye on the functioning of your device to ensure it is working properly. You are scheduled for a device check from home on 11/06/17. You may send your transmission at any time that day. If you have a wireless device, the transmission will be sent automatically. After your physician reviews your transmission, you will receive a postcard with your next transmission date.

## 2017-08-24 ENCOUNTER — Encounter: Payer: Medicare Other | Admitting: Internal Medicine

## 2017-09-04 LAB — CUP PACEART REMOTE DEVICE CHECK
Battery Remaining Longevity: 65 mo
Battery Voltage: 2.98 V
Brady Statistic AP VP Percent: 81.99 %
Brady Statistic AP VS Percent: 0.61 %
Brady Statistic AS VP Percent: 15.75 %
Brady Statistic AS VS Percent: 1.65 %
Brady Statistic RA Percent Paced: 79.87 %
Brady Statistic RV Percent Paced: 95.5 %
Date Time Interrogation Session: 20190709073524
HighPow Impedance: 40 Ohm
HighPow Impedance: 54 Ohm
Implantable Lead Implant Date: 20040309
Implantable Lead Implant Date: 20040309
Implantable Lead Location: 753859
Implantable Lead Location: 753860
Implantable Lead Model: 158
Implantable Lead Model: 5076
Implantable Lead Serial Number: 115061
Implantable Pulse Generator Implant Date: 20161007
Lead Channel Impedance Value: 399 Ohm
Lead Channel Impedance Value: 4047 Ohm
Lead Channel Impedance Value: 4047 Ohm
Lead Channel Impedance Value: 4047 Ohm
Lead Channel Impedance Value: 4047 Ohm
Lead Channel Impedance Value: 4047 Ohm
Lead Channel Impedance Value: 4047 Ohm
Lead Channel Impedance Value: 4047 Ohm
Lead Channel Impedance Value: 4047 Ohm
Lead Channel Impedance Value: 4047 Ohm
Lead Channel Impedance Value: 4047 Ohm
Lead Channel Impedance Value: 475 Ohm
Lead Channel Impedance Value: 475 Ohm
Lead Channel Pacing Threshold Amplitude: 0.5 V
Lead Channel Pacing Threshold Amplitude: 0.625 V
Lead Channel Pacing Threshold Pulse Width: 0.4 ms
Lead Channel Pacing Threshold Pulse Width: 0.4 ms
Lead Channel Sensing Intrinsic Amplitude: 1.75 mV
Lead Channel Sensing Intrinsic Amplitude: 1.75 mV
Lead Channel Sensing Intrinsic Amplitude: 10 mV
Lead Channel Sensing Intrinsic Amplitude: 10 mV
Lead Channel Setting Pacing Amplitude: 2 V
Lead Channel Setting Pacing Amplitude: 2.5 V
Lead Channel Setting Pacing Pulse Width: 0.4 ms
Lead Channel Setting Sensing Sensitivity: 0.3 mV

## 2017-09-14 DIAGNOSIS — L84 Corns and callosities: Secondary | ICD-10-CM | POA: Diagnosis not present

## 2017-10-10 DIAGNOSIS — K649 Unspecified hemorrhoids: Secondary | ICD-10-CM | POA: Diagnosis not present

## 2017-10-17 ENCOUNTER — Telehealth: Payer: Self-pay

## 2017-10-17 DIAGNOSIS — K6289 Other specified diseases of anus and rectum: Secondary | ICD-10-CM | POA: Diagnosis not present

## 2017-10-17 DIAGNOSIS — K625 Hemorrhage of anus and rectum: Secondary | ICD-10-CM | POA: Diagnosis not present

## 2017-10-17 DIAGNOSIS — K649 Unspecified hemorrhoids: Secondary | ICD-10-CM | POA: Diagnosis not present

## 2017-10-17 NOTE — Telephone Encounter (Signed)
**Note De-Identified  Obfuscation** I have ordered the pts next shipment of [name] with [employer] through [employer] Pt Asst program.   The pt ID for [place] pt asst is: [id number]  Confirmation # [id number]

## 2017-10-22 ENCOUNTER — Encounter: Payer: Self-pay | Admitting: Gastroenterology

## 2017-10-30 ENCOUNTER — Telehealth: Payer: Self-pay | Admitting: *Deleted

## 2017-10-30 NOTE — Telephone Encounter (Signed)
Called patient to inform him that his tikosyn had arrived at the office and that I would place it at the front desk for him to pick up at his convenience. He verbalized his understanding and appreciation.

## 2017-11-06 ENCOUNTER — Ambulatory Visit (INDEPENDENT_AMBULATORY_CARE_PROVIDER_SITE_OTHER): Payer: Medicare Other | Admitting: *Deleted

## 2017-11-06 DIAGNOSIS — I255 Ischemic cardiomyopathy: Secondary | ICD-10-CM

## 2017-11-07 NOTE — Progress Notes (Signed)
Remote ICD transmission.   

## 2017-11-09 DIAGNOSIS — E782 Mixed hyperlipidemia: Secondary | ICD-10-CM | POA: Diagnosis not present

## 2017-11-09 DIAGNOSIS — E1159 Type 2 diabetes mellitus with other circulatory complications: Secondary | ICD-10-CM | POA: Diagnosis not present

## 2017-11-15 ENCOUNTER — Encounter: Payer: Self-pay | Admitting: Cardiology

## 2017-11-15 DIAGNOSIS — E113393 Type 2 diabetes mellitus with moderate nonproliferative diabetic retinopathy without macular edema, bilateral: Secondary | ICD-10-CM | POA: Diagnosis not present

## 2017-11-15 DIAGNOSIS — Z23 Encounter for immunization: Secondary | ICD-10-CM | POA: Diagnosis not present

## 2017-11-15 DIAGNOSIS — I2510 Atherosclerotic heart disease of native coronary artery without angina pectoris: Secondary | ICD-10-CM | POA: Diagnosis not present

## 2017-11-15 DIAGNOSIS — E559 Vitamin D deficiency, unspecified: Secondary | ICD-10-CM | POA: Diagnosis not present

## 2017-11-15 DIAGNOSIS — I480 Paroxysmal atrial fibrillation: Secondary | ICD-10-CM | POA: Diagnosis not present

## 2017-11-15 DIAGNOSIS — E782 Mixed hyperlipidemia: Secondary | ICD-10-CM | POA: Diagnosis not present

## 2017-11-15 DIAGNOSIS — E1159 Type 2 diabetes mellitus with other circulatory complications: Secondary | ICD-10-CM | POA: Diagnosis not present

## 2017-11-19 ENCOUNTER — Ambulatory Visit (HOSPITAL_COMMUNITY)
Admission: RE | Admit: 2017-11-19 | Discharge: 2017-11-19 | Disposition: A | Discharge status: Home / Self Care | Payer: Medicare Other | Source: Ambulatory Visit | Attending: Nurse Practitioner | Admitting: Nurse Practitioner

## 2017-11-19 ENCOUNTER — Ambulatory Visit (HOSPITAL_COMMUNITY): Payer: Medicare Other | Admitting: Nurse Practitioner

## 2017-11-19 ENCOUNTER — Encounter (HOSPITAL_COMMUNITY): Payer: Self-pay | Admitting: Nurse Practitioner

## 2017-11-19 VITALS — BP 102/68 | HR 74 | Ht 74.0 in | Wt 217.0 lb

## 2017-11-19 DIAGNOSIS — E119 Type 2 diabetes mellitus without complications: Secondary | ICD-10-CM | POA: Diagnosis not present

## 2017-11-19 DIAGNOSIS — I4811 Longstanding persistent atrial fibrillation: Secondary | ICD-10-CM | POA: Diagnosis not present

## 2017-11-19 DIAGNOSIS — Z7901 Long term (current) use of anticoagulants: Secondary | ICD-10-CM | POA: Insufficient documentation

## 2017-11-19 DIAGNOSIS — Z951 Presence of aortocoronary bypass graft: Secondary | ICD-10-CM | POA: Insufficient documentation

## 2017-11-19 DIAGNOSIS — Z79899 Other long term (current) drug therapy: Secondary | ICD-10-CM | POA: Diagnosis not present

## 2017-11-19 DIAGNOSIS — I4819 Other persistent atrial fibrillation: Secondary | ICD-10-CM | POA: Diagnosis not present

## 2017-11-19 DIAGNOSIS — Z95810 Presence of automatic (implantable) cardiac defibrillator: Secondary | ICD-10-CM | POA: Insufficient documentation

## 2017-11-19 DIAGNOSIS — I2510 Atherosclerotic heart disease of native coronary artery without angina pectoris: Secondary | ICD-10-CM | POA: Insufficient documentation

## 2017-11-19 DIAGNOSIS — Z87891 Personal history of nicotine dependence: Secondary | ICD-10-CM | POA: Insufficient documentation

## 2017-11-19 DIAGNOSIS — Z952 Presence of prosthetic heart valve: Secondary | ICD-10-CM | POA: Insufficient documentation

## 2017-11-19 MED ORDER — APIXABAN 5 MG PO TABS: 5.0000 mg | ORAL_TABLET | Freq: Two times a day (BID) | ORAL | 3 refills | Status: DC

## 2017-11-19 NOTE — Patient Instructions (Signed)
Stop Aspirin  Start Eliquis 5mg  twice a day

## 2017-11-19 NOTE — Progress Notes (Signed)
Primary Care Physician: [name], MD Referring Physician: Dr. Bernerd Limbo clinc Cardiologist: Dr. Earlie Lou is a 84 y.o. male with a h/o paroxysmal afib that has shown persistent afib for the last 8 days. He has a device with his ICD/PPM that he can shock himself which he did x 2 without success this weekend. He was asked to come to afib clinic for anticoagulation to get started. He has tried warfarin in the past, most recently xarelto which gave him severe migraines. Has never tried eliquis. He is symptomatic in afib.  Today, he denies symptoms of palpitations, chest pain, shortness of breath, orthopnea, PND, lower extremity edema, dizziness, presyncope, syncope, or neurologic sequela. The patient is tolerating medications without difficulties and is otherwise without complaint today.   Past Medical History:  Diagnosis Date  . AICD (automatic cardioverter/defibrillator) present    Medtronicn PPM/ICD  . [place] artery disease    [facility]  . Diabetes mellitus   . Dysrhythmia   . Endocarditis, valve unspecified    Mitral  . Headache    hx migraines 6-7 x mo  . History of migraine headaches   . History of seasonal allergies   . ICD (implantable cardiac defibrillator) in place   . PAF (paroxysmal atrial fibrillation) ([place])    NO LAA occlusion at the time of surgery  [name] 2018  . Pleural effusion   . PVC's (premature ventricular contractions)   . Ventricular tachycardia, polymorphic ([place])    status post ICD implantation   Past Surgical History:  Procedure Laterality Date  . CARDIAC CATHETERIZATION  04/03/2007   EF 40%  . CARDIAC CATHETERIZATION  02/06/2006   EF 45%. ANTERIOR HYPOKINESIS  . CARDIAC CATHETERIZATION  05/29/2000   EF 50%. MILD ANTERIOR HYPOKINESIS  . CARDIAC CATHETERIZATION  10/25/1999   EF 50%. SEVERE MITRAL REGURGITATION  . CARDIAC CATHETERIZATION  01/06/2003   EF 50%  . CARDIAC DEFIBRILLATOR PLACEMENT    .  CATARACT EXTRACTION W/ INTRAOCULAR LENS  IMPLANT, BILATERAL    . CORONARY ARTERY BYPASS GRAFT  2001   2 VESSEL CABG AND MITRAL VALVE REPAIR. HE HAD A LIMA GRAFT TO THE LAD AND RADIAL ARTERY  GRAFT TO THE OBTUSE MARGINAL  OF LEFT CIRCUMFLEX  . EP IMPLANTABLE DEVICE N/A 11/06/2014   Procedure: ICD Generator Changeout;  Surgeon: Deboraha Sprang, MD;  Location: [facility];  Service: Cardiovascular;  Laterality: N/A;  . FOREIGN BODY REMOVAL Right 06/21/2017   Procedure: FOREIGN BODY REMOVAL ADULT RIGHT RING FINGER;  Surgeon: Leanora Cover, MD;  Location: [facility];  Service: Orthopedics;  Laterality: Right;  . HEMORRHOID SURGERY    . HEMORROIDECTOMY    . INTRAVASCULAR PRESSURE WIRE/FFR STUDY N/A 01/05/2017   Procedure: INTRAVASCULAR PRESSURE WIRE/FFR STUDY;  Surgeon: Sherren Mocha, MD;  Location: [facility];  Service: Cardiovascular;  Laterality: N/A;  . KNEE ARTHROSCOPY     RIGHT KNEE  . LEFT HEART CATHETERIZATION WITH CORONARY ANGIOGRAM N/A 07/04/2012   Procedure: LEFT HEART CATHETERIZATION WITH CORONARY ANGIOGRAM;  Surgeon: Sherren Mocha, MD;  Location: [facility] CATH LAB;  Service: Cardiovascular;  Laterality: N/A;  . MITRAL VALVE REPLACEMENT     No LAA occlusion at the time of surgery  Karie Soda report 2018  . RIGHT/LEFT HEART CATH AND CORONARY/GRAFT ANGIOGRAPHY N/A 01/05/2017   Procedure: RIGHT/LEFT HEART CATH AND CORONARY/GRAFT ANGIOGRAPHY;  Surgeon: Sherren Mocha, MD;  Location: [facility];  Service: Cardiovascular;  Laterality: N/A;  .  TRANSTHORACIC ECHOCARDIOGRAM  04/02/2007   EF 35%    Current Outpatient Medications  Medication Sig Dispense Refill  . atorvastatin (LIPITOR) 80 MG tablet Take 80 mg by mouth at bedtime.     . Cholecalciferol (VITAMIN D-3) 5000 UNITS TABS Take 5,000-10,000 Units by mouth See admin instructions. Take 5000 units by mouth on Sunday, Tuesday, Thursday, and Saturday. Take 10000 units by mouth on Monday, Wednesday, and Friday    . EPIPEN 2-PAK 0.3  MG/0.3ML SOAJ injection Inject 0.3 mg into the muscle once. USE ONLY IN EMERGENCY    . fluticasone (FLONASE) 50 MCG/ACT nasal spray Place 1 spray into the nose daily as needed for rhinitis or allergies.    Marland Kitchen ibuprofen (ADVIL,MOTRIN) 200 MG tablet Take 400 mg by mouth every 8 (eight) hours as needed for headache or moderate pain.     . Magnesium 250 MG TABS Take 250 mg by mouth daily.     . metoprolol succinate (TOPROL-XL) 50 MG 24 hr tablet TAKE ONE TABLET BY MOUTH DAILY IMMEDIATELY FOLLOWING A MEAL 90 tablet 3  . Multiple Vitamin (MULTIVITAMIN WITH MINERALS) TABS Take 1 tablet by mouth daily. Centrum Silver    . Multiple Vitamins-Minerals (PRESERVISION AREDS 2) CAPS Take 1 capsule by mouth 2 (two) times daily.     . nateglinide (STARLIX) 120 MG tablet Take 120 mg by mouth Twice daily.     . pioglitazone (ACTOS) 45 MG tablet Take 45 mg by mouth daily.      Marland Kitchen Propylhexedrine (BENZEDREX) INHA Place 1 each into the nose daily as needed (for congestion).    . SF 5000 PLUS 1.1 % CREA dental cream Place 1 application onto teeth at bedtime.   6  . sitaGLIPtin (JANUVIA) 100 MG tablet Take 100 mg by mouth at bedtime.     Marland Kitchen [name] 500 MCG capsule Take 1 capsule (500 mcg total) 2 (two) times daily by mouth. 180 capsule 3  . apixaban (ELIQUIS) 5 MG TABS tablet Take 1 tablet (5 mg total) by mouth 2 (two) times daily. 60 tablet 3   No current facility-administered medications for this encounter.     Allergies  Allergen Reactions  . Latex Other (See Comments)    REDNESS AT REACTION SITE.  Marland Kitchen Metformin Diarrhea  . Rivaroxaban Other (See Comments)    Headaches, blurred vision  . Other Other (See Comments)    BEE STINGS   . Sotalol Other (See Comments)    "BLUE TOES"- cut his circulation off  . Warfarin Sodium Other (See Comments)    REACTION: migraine headaches/vision impariment    Social History   Socioeconomic History  . Marital status: Married    Spouse name: Not on file  . Number of  children: Not on file  . Years of education: Not on file  . Highest education level: Not on file  Occupational History  . Not on file  Social Needs  . Financial resource strain: Not on file  . Food insecurity:    Worry: Not on file    Inability: Not on file  . Transportation needs:    Medical: Not on file    Non-medical: Not on file  Tobacco Use  . Smoking status: Former Smoker    Last attempt to quit: 01/31/1983    Years since quitting: 34.8  . Smokeless tobacco: Never Used  Substance and Sexual Activity  . Alcohol use: Yes    Alcohol/week: 0.0 standard drinks    Comment: 1-2 a week  .  Drug use: No  . Sexual activity: Not on file  Lifestyle  . Physical activity:    Days per week: Not on file    Minutes per session: Not on file  . Stress: Not on file  Relationships  . Social connections:    Talks on phone: Not on file    Gets together: Not on file    Attends religious service: Not on file    Active member of club or organization: Not on file    Attends meetings of clubs or organizations: Not on file    Relationship status: Not on file  . Intimate partner violence:    Fear of current or ex partner: Not on file    Emotionally abused: Not on file    Physically abused: Not on file    Forced sexual activity: Not on file  Other Topics Concern  . Not on file  Social History Narrative  . Not on file    Family History  Problem Relation Age of Onset  . Heart disease Father   . Emphysema Father   . Stroke Mother   . Cancer Sister        breast cancer  . Heart disease Paternal Grandmother   . Heart disease Paternal Grandfather   . Diabetes Other        grandmother, aunt, uncle  . Cancer Other        lung cancer, aunt    ROS- All systems are reviewed and negative except as per the HPI above  Physical Exam: Vitals:   11/19/17 1537  BP: 102/68  Pulse: 74  SpO2: 96%  Weight: 98.4 kg  Height: 6\' 2"  (1.88 m)   Wt Readings from Last 3 Encounters:  11/19/17 98.4  kg  08/20/17 98 kg  06/21/17 96.6 kg    Labs: Lab Results  Component Value Date   NA 136 06/20/2017   K 4.5 06/20/2017   CL 101 06/20/2017   CO2 29 06/20/2017   GLUCOSE 151 (H) 06/20/2017   BUN 12 06/20/2017   CREATININE 1.00 06/20/2017   CALCIUM 9.1 06/20/2017   MG 2.2 11/24/2016   Lab Results  Component Value Date   INR 1.1 12/27/2016   Lab Results  Component Value Date   CHOL  04/02/2007    195        ATP III CLASSIFICATION:  <200     mg/dL   Desirable  200-239  mg/dL   Borderline High  >=240    mg/dL   High   HDL 47 04/02/2007   LDLCALC (H) 04/02/2007    102        Total Cholesterol/HDL:CHD Risk Coronary Heart Disease Risk Table                     Men   Women  1/2 Average Risk   3.4   3.3   TRIG 231 (H) 04/02/2007     GEN- The patient is well appearing, alert and oriented x 3 today.   Head- normocephalic, atraumatic Eyes-  Sclera clear, conjunctiva pink Ears- hearing intact Oropharynx- clear Neck- supple, no JVP Lymph- no cervical lymphadenopathy Lungs- Clear to ausculation bilaterally, normal work of breathing Heart- Regular rate and rhythm, no murmurs, rubs or gallops, PMI not laterally displaced GI- soft, NT, ND, + BS Extremities- no clubbing, cyanosis, or edema MS- no significant deformity or atrophy Skin- no rash or lesion Psych- euthymic mood, full affect Neuro- strength and sensation are intact  EKG-v paced rhythm at 74 bpm    Assessment and Plan: 1. Persistent afib Symptomatic with fatigue, lightheadedness Continue metoprolol  Not on anticoagulation Will start eliquis 5 mg bid for a chadsvasc score of at least 5 Stop asa Bleeding precautions discussed  Will discuss with Dr. Caryl Comes, he had just gone into a case earlier when I called However, I expect TEE guided cardioversion after a few days of eliquis I asked pt not to try to shock himself further  F/u after cardioversion with Dr. Norma Fredrickson C. Dashanae [place], [facility] [address] [place], [place] [address] [phone]

## 2017-11-19 NOTE — Telephone Encounter (Signed)
Pt has been called. He states he has been in Afib x 8 days. Pt has been set up to see Roderic Palau in Afib clinic today. Pt would like to discuss anticoagulation and Afib management. Pt states he is symptomatic currently. Directions, parking lot code, and phone number to clinic were given to pt. He had no additional questions.

## 2017-11-20 ENCOUNTER — Telehealth (HOSPITAL_COMMUNITY): Payer: Self-pay | Admitting: *Deleted

## 2017-11-20 NOTE — Telephone Encounter (Signed)
Patient called in stating he converted back to NSR overnight. Feeling much improved and his HR is regular and controlled. Per Roderic Palau NP continue Eliquis and will have follow up with Dr. Caryl Comes in the next few weeks.

## 2017-11-27 ENCOUNTER — Ambulatory Visit: Payer: Medicare Other | Admitting: Gastroenterology

## 2017-11-29 LAB — CUP PACEART REMOTE DEVICE CHECK
Battery Remaining Longevity: 59 mo
Battery Voltage: 2.98 V
Brady Statistic AP VP Percent: 78.12 %
Brady Statistic AP VS Percent: 0.53 %
Brady Statistic AS VP Percent: 17.47 %
Brady Statistic AS VS Percent: 3.87 %
Brady Statistic RA Percent Paced: 74.94 %
Brady Statistic RV Percent Paced: 92.46 %
Date Time Interrogation Session: 20191008042204
HighPow Impedance: 40 Ohm
HighPow Impedance: 54 Ohm
Implantable Lead Implant Date: 20040309
Implantable Lead Implant Date: 20040309
Implantable Lead Location: 753859
Implantable Lead Location: 753860
Implantable Lead Model: 158
Implantable Lead Model: 5076
Implantable Lead Serial Number: 115061
Implantable Pulse Generator Implant Date: 20161007
Lead Channel Impedance Value: 399 Ohm
Lead Channel Impedance Value: 4047 Ohm
Lead Channel Impedance Value: 4047 Ohm
Lead Channel Impedance Value: 4047 Ohm
Lead Channel Impedance Value: 4047 Ohm
Lead Channel Impedance Value: 4047 Ohm
Lead Channel Impedance Value: 4047 Ohm
Lead Channel Impedance Value: 4047 Ohm
Lead Channel Impedance Value: 4047 Ohm
Lead Channel Impedance Value: 4047 Ohm
Lead Channel Impedance Value: 4047 Ohm
Lead Channel Impedance Value: 475 Ohm
Lead Channel Impedance Value: 475 Ohm
Lead Channel Pacing Threshold Amplitude: 0.5 V
Lead Channel Pacing Threshold Amplitude: 0.625 V
Lead Channel Pacing Threshold Pulse Width: 0.4 ms
Lead Channel Pacing Threshold Pulse Width: 0.4 ms
Lead Channel Sensing Intrinsic Amplitude: 1.875 mV
Lead Channel Sensing Intrinsic Amplitude: 1.875 mV
Lead Channel Sensing Intrinsic Amplitude: 11.75 mV
Lead Channel Sensing Intrinsic Amplitude: 11.75 mV
Lead Channel Setting Pacing Amplitude: 2 V
Lead Channel Setting Pacing Amplitude: 2.5 V
Lead Channel Setting Pacing Pulse Width: 0.4 ms
Lead Channel Setting Sensing Sensitivity: 0.3 mV

## 2017-12-18 ENCOUNTER — Ambulatory Visit
Admission: RE | Admit: 2017-12-18 | Discharge: 2017-12-18 | Disposition: A | Discharge status: Home / Self Care | Payer: Medicare Other | Source: Ambulatory Visit | Attending: Internal Medicine | Admitting: Internal Medicine

## 2017-12-18 ENCOUNTER — Encounter: Payer: Self-pay | Admitting: Internal Medicine

## 2017-12-18 ENCOUNTER — Telehealth: Payer: Self-pay | Admitting: Internal Medicine

## 2017-12-18 ENCOUNTER — Ambulatory Visit (INDEPENDENT_AMBULATORY_CARE_PROVIDER_SITE_OTHER): Payer: Medicare Other | Admitting: Internal Medicine

## 2017-12-18 ENCOUNTER — Other Ambulatory Visit: Payer: Self-pay

## 2017-12-18 VITALS — BP 112/74 | HR 79 | Ht 74.0 in | Wt 219.4 lb

## 2017-12-18 DIAGNOSIS — I4811 Longstanding persistent atrial fibrillation: Secondary | ICD-10-CM

## 2017-12-18 DIAGNOSIS — I472 Ventricular tachycardia, unspecified: Secondary | ICD-10-CM

## 2017-12-18 DIAGNOSIS — I5022 Chronic systolic (congestive) heart failure: Secondary | ICD-10-CM

## 2017-12-18 DIAGNOSIS — J90 Pleural effusion, not elsewhere classified: Secondary | ICD-10-CM | POA: Diagnosis not present

## 2017-12-18 DIAGNOSIS — Z4502 Encounter for adjustment and management of automatic implantable cardiac defibrillator: Secondary | ICD-10-CM

## 2017-12-18 DIAGNOSIS — I255 Ischemic cardiomyopathy: Secondary | ICD-10-CM

## 2017-12-18 DIAGNOSIS — I25119 Atherosclerotic heart disease of native coronary artery with unspecified angina pectoris: Secondary | ICD-10-CM | POA: Diagnosis not present

## 2017-12-18 MED ORDER — DABIGATRAN ETEXILATE MESYLATE 150 MG PO CAPS: 150.0000 mg | ORAL_CAPSULE | Freq: Two times a day (BID) | ORAL | 3 refills | Status: DC

## 2017-12-18 MED ORDER — FUROSEMIDE 20 MG PO TABS: 20.0000 mg | ORAL_TABLET | Freq: Every day | ORAL | 3 refills | Status: DC

## 2017-12-18 NOTE — Patient Instructions (Addendum)
Medication Instructions: Stop Eliquis,   Start Pradaxa 150mg  by mouth twice a day,   Start Lasix 20mg  a day for three days of no improvement may increase to 40mg  a day.    If you need a refill on your cardiac medications before your next appointment, please call your pharmacy.    Lab work: Mag, BNP, bmet cbc.... Today  If you have labs (blood work) drawn today and your tests are completely normal, you will receive your results only by: Marland Kitchen MyChart Message (if you have MyChart) OR . A paper copy in the mail If you have any lab test that is abnormal or we need to change your treatment, we will call you to review the results.    Testing/Procedures: chest xray today     Follow-Up: 2 months with Dr. Caryl Comes  At [facility], you and your health needs are our priority.  As part of our continuing mission to provide you with exceptional heart care, we have created designated Provider Care Teams.  These Care Teams include your primary Cardiologist (physician) and Advanced Practice Providers (APPs -  Physician Assistants and Nurse Practitioners) who all work together to provide you with the care you need, when you need it. .      Any Other Special Instructions Will Be Listed Below (If Applicable). Consult with Pulmonary... Dr. Halford Chessman for Shortness of breath and Neurology for TIA

## 2017-12-18 NOTE — Telephone Encounter (Signed)
New Message:   Is pt taking Eliquis and Pradaxa ? Pt just picked up a prescription for Eliquis, wanted to be sure what pt is supposed to be taking and if he is aware of it also.

## 2017-12-18 NOTE — Progress Notes (Signed)
Pt advised may increase Lasix 20mg  to 40mg  a day after 3 days if no improvement.. Starting 12/18/17... Per Dr. Caryl Comes

## 2017-12-18 NOTE — Progress Notes (Signed)
Patient Care Team: [name], MD as PCP - General (Family Medicine) Sherren Mocha, MD as PCP - Cardiology (Cardiology)   HPI  [name] is a 84 y.o. male Seen today because of   recent bout of persistent atrial fibrillation which was quite symptomatic and prompted a visit to the A. fib clinic with the plan for anticoagulation and TEE cardioversion.  He had failed home shocks x2.  He converted then spontaneously.  He remains on Eliquis at this time      He has a history of polymorphic ventricular tachycardia for which he has an ICD   He was seen November 2013 because of 2 episodes of polymorphic ventricular tachycardia occurring in the preceding few weeks after having been quiet for months and months. There was no clear inciting event. Beta blocker therapy was gently uptitrated.  No intercurrent VT He has frequent PVCs  History of coronary disease with prior CABG mitral valve repair both of which were done at [facility]  PFO closure at that time.    DATE TEST EF   6/14 LHC 35-40% LIMA/ RIMA patent   2/17 Echo   30 %   12/18 LHC   LIMA-LAD and right radial graft-OM- patent proximal cx stenosis with negative FFR    He Is on dofetilide for his arrhythmias  .    Date Cr K Mg  9/16  0.99 4.2 2.1  12/17  1.08 4.6 2.1  5/19 1.00 4.5 2.2  10/19 1.04 4.4     He has had persistent problems with atrial fibrillation over the last couple of weeks.  This is been temporally associated with worsening dyspnea on exertion.  He has had more peripheral edema. Denies chest pain.  A few years ago he had an evaluation and was thought to have COPD.  He was treated with an inhaler with a significant improvement.  Has not been seen since because of the cost of medication.  He was started on Eliquis as noted above.  He has complaints of dizziness which is been non-positional as well as double vision which was not clearly associated with rotation of the head.   Device History: STJ  dual chamber ICD implanted 2004 for PMVT, ICM; generator change 2010; gen change 2016 (MDT device) History of appropriate therapy: Yes History of AAD therapy: Yes   Past Medical History:  Diagnosis Date  . AICD (automatic cardioverter/defibrillator) present    Medtronicn PPM/ICD  . Coronary artery disease    [facility]  . Diabetes mellitus   . Dysrhythmia   . Endocarditis, valve unspecified    Mitral  . Headache    hx migraines 6-7 x mo  . History of migraine headaches   . History of seasonal allergies   . ICD (implantable cardiac defibrillator) in place   . PAF (paroxysmal atrial fibrillation) ([place])    NO LAA occlusion at the time of surgery  [name] 2018  . Pleural effusion   . PVC's (premature ventricular contractions)   . Ventricular tachycardia, polymorphic ([place])    status post ICD implantation    Past Surgical History:  Procedure Laterality Date  . CARDIAC CATHETERIZATION  04/03/2007   EF 40%  . CARDIAC CATHETERIZATION  02/06/2006   EF 45%. ANTERIOR HYPOKINESIS  . CARDIAC CATHETERIZATION  05/29/2000   EF 50%. MILD ANTERIOR HYPOKINESIS  . CARDIAC CATHETERIZATION  10/25/1999   EF 50%. SEVERE MITRAL REGURGITATION  . CARDIAC CATHETERIZATION  01/06/2003   EF 50%  .  CARDIAC DEFIBRILLATOR PLACEMENT    . CATARACT EXTRACTION W/ INTRAOCULAR LENS  IMPLANT, BILATERAL    . CORONARY ARTERY BYPASS GRAFT  2001   2 VESSEL CABG AND MITRAL VALVE REPAIR. HE HAD A LIMA GRAFT TO THE LAD AND RADIAL ARTERY  GRAFT TO THE OBTUSE MARGINAL  OF LEFT CIRCUMFLEX  . EP IMPLANTABLE DEVICE N/A 11/06/2014   Procedure: ICD Generator Changeout;  Surgeon: Deboraha Sprang, MD;  Location: [facility];  Service: Cardiovascular;  Laterality: N/A;  . FOREIGN BODY REMOVAL Right 06/21/2017   Procedure: FOREIGN BODY REMOVAL ADULT RIGHT RING FINGER;  Surgeon: Leanora Cover, MD;  Location: [place];  Service: Orthopedics;  Laterality: Right;  . HEMORRHOID SURGERY    . HEMORROIDECTOMY    .  INTRAVASCULAR PRESSURE WIRE/FFR STUDY N/A 01/05/2017   Procedure: INTRAVASCULAR PRESSURE WIRE/FFR STUDY;  Surgeon: Sherren Mocha, MD;  Location: [place] [facility];  Service: Cardiovascular;  Laterality: N/A;  . KNEE ARTHROSCOPY     RIGHT KNEE  . LEFT HEART CATHETERIZATION WITH CORONARY ANGIOGRAM N/A 07/04/2012   Procedure: LEFT HEART CATHETERIZATION WITH CORONARY ANGIOGRAM;  Surgeon: Sherren Mocha, MD;  Location: [facility] CATH LAB;  Service: Cardiovascular;  Laterality: N/A;  . MITRAL VALVE REPLACEMENT     No LAA occlusion at the time of surgery  Karie Soda report 2018  . RIGHT/LEFT HEART CATH AND CORONARY/GRAFT ANGIOGRAPHY N/A 01/05/2017   Procedure: RIGHT/LEFT HEART CATH AND CORONARY/GRAFT ANGIOGRAPHY;  Surgeon: Sherren Mocha, MD;  Location: [place] [facility];  Service: Cardiovascular;  Laterality: N/A;  . TRANSTHORACIC ECHOCARDIOGRAM  04/02/2007   EF 35%    Current Outpatient Medications  Medication Sig Dispense Refill  . apixaban (ELIQUIS) 5 MG TABS tablet Take 1 tablet (5 mg total) by mouth 2 (two) times daily. 60 tablet 3  . atorvastatin (LIPITOR) 80 MG tablet Take 80 mg by mouth at bedtime.     . Cholecalciferol (VITAMIN D-3) 5000 UNITS TABS Take 5,000-10,000 Units by mouth See admin instructions. Take 5000 units by mouth on Sunday, Tuesday, Thursday, and Saturday. Take 10000 units by mouth on Monday, Wednesday, and Friday    . EPIPEN 2-PAK 0.3 MG/0.3ML SOAJ injection Inject 0.3 mg into the muscle once. USE ONLY IN EMERGENCY    . fluticasone (FLONASE) 50 MCG/ACT nasal spray Place 1 spray into the nose daily as needed for rhinitis or allergies.    Marland Kitchen ibuprofen (ADVIL,MOTRIN) 200 MG tablet Take 400 mg by mouth every 8 (eight) hours as needed for headache or moderate pain.     . Magnesium 250 MG TABS Take 250 mg by mouth daily.     . metoprolol succinate (TOPROL-XL) 50 MG 24 hr tablet TAKE ONE TABLET BY MOUTH DAILY IMMEDIATELY FOLLOWING A MEAL 90 tablet 3  . Multiple Vitamin (MULTIVITAMIN WITH  MINERALS) TABS Take 1 tablet by mouth daily. Centrum Silver    . Multiple Vitamins-Minerals (PRESERVISION AREDS 2) CAPS Take 1 capsule by mouth 2 (two) times daily.     . nateglinide (STARLIX) 120 MG tablet Take 120 mg by mouth Twice daily.     . pioglitazone (ACTOS) 45 MG tablet Take 45 mg by mouth daily.      Marland Kitchen Propylhexedrine (BENZEDREX) INHA Place 1 each into the nose daily as needed (for congestion).    . SF 5000 PLUS 1.1 % CREA dental cream Place 1 application onto teeth at bedtime.   6  . sitaGLIPtin (JANUVIA) 100 MG tablet Take 100 mg by mouth at bedtime.     Marland Kitchen  [name] 500 MCG capsule Take 1 capsule (500 mcg total) 2 (two) times daily by mouth. 180 capsule 3   No current facility-administered medications for this visit.     Allergies  Allergen Reactions  . Latex Other (See Comments)    REDNESS AT REACTION SITE.  Marland Kitchen Metformin Diarrhea  . Rivaroxaban Other (See Comments)    Headaches, blurred vision  . Other Other (See Comments)    BEE STINGS   . Sotalol Other (See Comments)    "BLUE TOES"- cut his circulation off  . Warfarin Sodium Other (See Comments)    REACTION: migraine headaches/vision impariment    Review of Systems negative except from HPI and PMH  Physical Exam BP 112/74   Pulse 79   Ht 6\' 2"  (1.88 m)   Wt 219 lb 6.4 oz (99.5 kg)   SpO2 94%   BMI 28.17 kg/m  Well developed and nourished in no acute distress HENT normal Neck supple with JVP-7-8 cm  clear Regular rate and rhythm, no murmurs or gallops Abd-soft with active BS No Clubbing cyanosis 2+ edema edema Skin-warm and dry A & Oriented  Grossly normal sensory and motor function   ECG P synchronous pacing       Assessment and  Plan  Ischemic heart disease with prior bypass  Ischemic cardiomyopathy ejection 35-40%  Congestive heart failure-acute/chronic-systolic  Implantable defibrillator-Medtronic  The patient's device was interrogated.  The information was reviewed.  Device was  reprogrammed as below  PVCs  Intolerant of ranolazine  Atrial fibrillation persistent  Dofetilide therapy  Ventricular tachycardia nonsustained    Diplopia   More atrial fibrillation.  Hopefully it is related to heart failure.  His OptiVol was unimpressive however, he is got volume overload on examination was started on low-dose diuretics.  We will check a BMP to try to make the distinction between cardiac and pulmonary shortness of breath as he had a prior good response to inhalers.  Recent diplopia is concerning.  He has been on anticoagulant for his atrial fibrillation for some time.  I have reached out to [facility] to have him review his operative note to see whether he has atrial appendage excised.  We will refer him to neurology for better assessment of the event itself.  He is wondering whether his symptoms are related to Eliquis.  A literature review suggests that dyspnea in fact can be associated with Eliquis.  Unusually, but we will discontinue the Eliquis and try him on dabigatran.  No significant history of GE reflux.  GFR today is over 70  More than 50% of 40 min was spent in counseling related to the above

## 2017-12-19 LAB — CBC
Hematocrit: 38.9 % (ref 37.5–51.0)
Hemoglobin: 13.2 g/dL (ref 13.0–17.7)
MCH: 33.6 pg — ABNORMAL HIGH (ref 26.6–33.0)
MCHC: 33.9 g/dL (ref 31.5–35.7)
MCV: 99 fL — ABNORMAL HIGH (ref 79–97)
Platelets: 156 10*3/uL (ref 150–450)
RBC: 3.93 x10E6/uL — ABNORMAL LOW (ref 4.14–5.80)
RDW: 13.1 % (ref 12.3–15.4)
WBC: 4.6 10*3/uL (ref 3.4–10.8)

## 2017-12-19 LAB — PRO B NATRIURETIC PEPTIDE: NT-Pro BNP: 1465 pg/mL — ABNORMAL HIGH (ref 0–486)

## 2017-12-19 LAB — BASIC METABOLIC PANEL
BUN/Creatinine Ratio: 15 (ref 10–24)
BUN: 13 mg/dL (ref 8–27)
CO2: 25 mmol/L (ref 20–29)
Calcium: 8.9 mg/dL (ref 8.6–10.2)
Chloride: 99 mmol/L (ref 96–106)
Creatinine, Ser: 0.86 mg/dL (ref 0.76–1.27)
GFR calc Af Amer: 92 mL/min/{1.73_m2} (ref >59)
GFR calc non Af Amer: 80 mL/min/{1.73_m2} (ref >59)
Glucose: 118 mg/dL — ABNORMAL HIGH (ref 65–99)
Potassium: 4.7 mmol/L (ref 3.5–5.2)
Sodium: 136 mmol/L (ref 134–144)

## 2017-12-19 LAB — MAGNESIUM: Magnesium: 2.2 mg/dL (ref 1.6–2.3)

## 2017-12-19 NOTE — Telephone Encounter (Signed)
Spoke with pharmacist. She states pt has begun his new Rx for pradaxa with samples provided. However, pt is concerned bc cost of pradaxa is greater than his Eliquis. The pharmacist advised him he is probably in his donut hole and he may be able to get by with buying one 30 day supply until the new year. Pt verbalized understanding and will call the office with further concern.

## 2017-12-19 NOTE — Telephone Encounter (Signed)
Spoke with pharmacist. She states pt has begun his new Rx for pradaxa with samples provided. However, pt is concerned bc cost of prada

## 2017-12-20 ENCOUNTER — Encounter: Payer: Self-pay | Admitting: Neurology

## 2017-12-20 DIAGNOSIS — I255 Ischemic cardiomyopathy: Secondary | ICD-10-CM

## 2017-12-20 DIAGNOSIS — I5022 Chronic systolic (congestive) heart failure: Secondary | ICD-10-CM
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
## 2017-12-21 ENCOUNTER — Ambulatory Visit (INDEPENDENT_AMBULATORY_CARE_PROVIDER_SITE_OTHER): Payer: Medicare Other | Admitting: Neurology

## 2017-12-21 ENCOUNTER — Encounter: Payer: Self-pay | Admitting: Neurology

## 2017-12-21 VITALS — BP 126/74 | HR 80 | Ht 74.0 in | Wt 214.0 lb

## 2017-12-21 DIAGNOSIS — G459 Transient cerebral ischemic attack, unspecified: Secondary | ICD-10-CM

## 2017-12-21 DIAGNOSIS — I255 Ischemic cardiomyopathy: Secondary | ICD-10-CM

## 2017-12-21 NOTE — Patient Instructions (Addendum)
1.  We will check CTA of head and neck 2.  If Dr. Caryl Comes has not objection, consider starting aspirin 81mg  daily.  In addition to Pradaxa, it will increase risk of bleeding so we need to weigh risks of benefits 3.  Follow up in 3 to 4 months.  We have sent a referral to [facility] for your MRI and they will call you directly to schedule your appt. They are located at [place]. If you need to contact them directly please call [phone].

## 2017-12-26 ENCOUNTER — Ambulatory Visit (HOSPITAL_COMMUNITY): Payer: Medicare Other | Attending: Internal Medicine

## 2017-12-26 ENCOUNTER — Other Ambulatory Visit: Payer: Self-pay

## 2017-12-26 DIAGNOSIS — I255 Ischemic cardiomyopathy: Secondary | ICD-10-CM | POA: Insufficient documentation

## 2017-12-26 DIAGNOSIS — I5022 Chronic systolic (congestive) heart failure: Secondary | ICD-10-CM | POA: Insufficient documentation

## 2017-12-31 ENCOUNTER — Other Ambulatory Visit: Payer: Medicare Other

## 2017-12-31 ENCOUNTER — Ambulatory Visit
Admission: RE | Admit: 2017-12-31 | Discharge: 2017-12-31 | Disposition: A | Discharge status: Home / Self Care | Payer: Medicare Other | Source: Ambulatory Visit | Attending: Neurology | Admitting: Neurology

## 2017-12-31 DIAGNOSIS — G459 Transient cerebral ischemic attack, unspecified: Secondary | ICD-10-CM

## 2017-12-31 DIAGNOSIS — H532 Diplopia: Secondary | ICD-10-CM | POA: Diagnosis not present

## 2017-12-31 DIAGNOSIS — I6523 Occlusion and stenosis of bilateral carotid arteries: Secondary | ICD-10-CM | POA: Diagnosis not present

## 2017-12-31 MED ORDER — IOPAMIDOL (ISOVUE-370) INJECTION 76%
75.0000 mL | Freq: Once | INTRAVENOUS | Status: AC | PRN
  Administered 2017-12-31: 75 mL via INTRAVENOUS

## 2018-01-01 LAB — CUP PACEART INCLINIC DEVICE CHECK
Battery Remaining Longevity: 56 mo
Battery Voltage: 2.97 V
Brady Statistic AP VP Percent: 72.84 %
Brady Statistic AP VS Percent: 0.6 %
Brady Statistic AS VP Percent: 20.87 %
Brady Statistic AS VS Percent: 5.69 %
Brady Statistic RA Percent Paced: 70.03 %
Brady Statistic RV Percent Paced: 91.44 %
Date Time Interrogation Session: 20191119195221
HighPow Impedance: 39 Ohm
HighPow Impedance: 52 Ohm
Implantable Lead Implant Date: 20040309
Implantable Lead Implant Date: 20040309
Implantable Lead Location: 753859
Implantable Lead Location: 753860
Implantable Lead Model: 158
Implantable Lead Model: 5076
Implantable Lead Serial Number: 115061
Implantable Pulse Generator Implant Date: 20161007
Lead Channel Impedance Value: 399 Ohm
Lead Channel Impedance Value: 4047 Ohm
Lead Channel Impedance Value: 4047 Ohm
Lead Channel Impedance Value: 4047 Ohm
Lead Channel Impedance Value: 4047 Ohm
Lead Channel Impedance Value: 4047 Ohm
Lead Channel Impedance Value: 4047 Ohm
Lead Channel Impedance Value: 4047 Ohm
Lead Channel Impedance Value: 4047 Ohm
Lead Channel Impedance Value: 4047 Ohm
Lead Channel Impedance Value: 4047 Ohm
Lead Channel Impedance Value: 418 Ohm
Lead Channel Impedance Value: 456 Ohm
Lead Channel Pacing Threshold Amplitude: 0.5 V
Lead Channel Pacing Threshold Amplitude: 0.75 V
Lead Channel Pacing Threshold Pulse Width: 0.4 ms
Lead Channel Pacing Threshold Pulse Width: 0.4 ms
Lead Channel Sensing Intrinsic Amplitude: 1.75 mV
Lead Channel Sensing Intrinsic Amplitude: 5.625 mV
Lead Channel Setting Pacing Amplitude: 2 V
Lead Channel Setting Pacing Amplitude: 2.5 V
Lead Channel Setting Pacing Pulse Width: 0.4 ms
Lead Channel Setting Sensing Sensitivity: 0.3 mV

## 2018-01-02 ENCOUNTER — Encounter: Payer: Self-pay | Admitting: *Deleted

## 2018-01-02 ENCOUNTER — Telehealth: Payer: Self-pay | Admitting: *Deleted

## 2018-01-02 NOTE — Telephone Encounter (Signed)
Results sent via My Chart.  

## 2018-01-02 NOTE — Telephone Encounter (Signed)
-----   Message from Pieter Partridge, DO sent at 12/31/2017  2:19 PM EST ----- CTA shows some atherosclerosis of the blood vessels but nothing clinically significant that would have corresponded to causing double vision.

## 2018-01-15 ENCOUNTER — Encounter: Payer: Self-pay | Admitting: Pulmonary Disease

## 2018-01-15 ENCOUNTER — Ambulatory Visit (INDEPENDENT_AMBULATORY_CARE_PROVIDER_SITE_OTHER): Payer: Medicare Other | Admitting: Pulmonary Disease

## 2018-01-15 VITALS — BP 124/72 | HR 88 | Ht 74.0 in | Wt 211.0 lb

## 2018-01-15 DIAGNOSIS — R0609 Other forms of dyspnea: Secondary | ICD-10-CM

## 2018-01-15 DIAGNOSIS — R0600 Dyspnea, unspecified: Secondary | ICD-10-CM

## 2018-01-15 DIAGNOSIS — I255 Ischemic cardiomyopathy: Secondary | ICD-10-CM | POA: Diagnosis not present

## 2018-01-15 MED ORDER — UMECLIDINIUM-VILANTEROL 62.5-25 MCG/INH IN AEPB: 1.0000 | INHALATION_SPRAY | Freq: Every day | RESPIRATORY_TRACT | 5 refills | Status: DC

## 2018-01-15 NOTE — Patient Instructions (Signed)
Anoro one puff daily  Will schedule pulmonary function test  Follow up in 4 weeks with Dr. Halford Chessman or Nurse Practitioner

## 2018-01-15 NOTE — Progress Notes (Signed)
[facility]  Chief Complaint  Patient presents with  . pulm consult    Pt was referred by Dr. Warnell Forester MD for SOB with exertion. Pt has dry cough, SOB with exertion and some wheezing.     Constitutional:  BP 124/72 (BP Location: Left Arm, Cuff Size: Normal)   Pulse 88   Ht 6\' 2"  (1.88 m)   Wt 211 lb (95.7 kg)   SpO2 99%   BMI 27.09 kg/m   Past Medical History:  Non sustained Ventricular Tachycardia, Persistent A fib, s/p AICD, CAD s/p CABG, Migraine HA, DM, systolic CHF.  Brief Summary:  [name] is a 84 y.o. male former smoker with dyspnea.  He is followed by cardiology for CAD, VT, PAF, and systolic CHF.  He has noticed trouble with his breathing for years.  This has been getting worse.  He can walk about 200 feet before he has to stop and rest.  He gets occasional wheezing also.  He had spirometry done by his PCP several years ago and was told he had COPD.  He was started on anoro and this helped.  He hit the donut whole with medicare part D and had to stop anoro.  He had CT chest from 01/11/03 that showed hyperinflation suggestive of COPD.  He used to smoke 1 pack per day and quit about 40 yrs ago.  His father passed away from emphysema.  He is from [place], but also lived in [place] before [place] in [place].  He worked with Radio producer.  He was in the [employer].  No animal/bird exposures.  No history of pneumonia or exposure to TB.   Physical Exam:   Appearance - well kempt   ENMT - clear nasal mucosa, midline nasal  septum, no oral exudates, no LAN, trachea midline  Respiratory - normal chest wall, normal respiratory effort, no accessory muscle use, no wheeze/rales  CV - s1s2 regular rate and rhythm, no murmurs, no peripheral edema, radial pulses symmetric  GI - soft, non tender, no masses  Lymph - no adenopathy noted in neck and axillary areas  MSK - normal gait  Ext - no cyanosis, clubbing, or joint  inflammation noted  Skin - no rashes, lesions, or ulcers  Neuro - normal strength, oriented x 3  Psych - normal mood and affect  CXR 12/19/17 (reviewed by me) >> atherosclerosis, calcified nodule RUL  Labs 12/18/17 >> Hb 13.2, PLT 156, WBC 4.6, Na 136, Creatinine 0.86, CO2 25, NT pro BNP 1465  Discussion:  He has prior history of smoking.  He reports previous spirometry consistent with COPD, his prior chest imaging study showed changes suggestive of COPD, and he has family history of emphysema.  He reports prior clinical response to inhaler therapy.  Assessment/Plan:   Dyspnea on exertion likely from COPD. - will restart anoro; samples given - will arrange for pulmonary function test  Systolic CHF, A fib, CAD, VT. - f/u with cardiology    Patient Instructions  Anoro one puff daily  Will schedule pulmonary function test  Follow up in 4 weeks with Dr. Halford Chessman or Nurse Practitioner    Chesley Mires, MD [place] Pager: [phone] 01/15/2018, 10:13 AM  Flow Sheet     Pulmonary tests:    Cardiac tests:  Echo 12/26/17 >> EF 35 to 40%, mild MS and MR, PAS 50 mmHg   Review of Systems:  Constitutional: Negative for fever and unexpected weight change.  HENT: Negative for congestion, dental problem, ear pain, nosebleeds, postnasal drip, rhinorrhea, sinus pressure, sneezing, sore throat and trouble swallowing.   Eyes: Negative for redness and itching.  Respiratory: Positive for cough and shortness of breath. Negative for chest tightness and wheezing.   Cardiovascular: Positive for leg swelling. Negative for palpitations.  Gastrointestinal: Negative for nausea and vomiting.  Genitourinary: Negative for dysuria.  Musculoskeletal: Positive for joint swelling.  Skin: Negative for rash.  Allergic/Immunologic: Positive for environmental allergies. Negative for food allergies and immunocompromised state.  Neurological: Positive for headaches.    Hematological: Does not bruise/bleed easily.  Psychiatric/Behavioral: Negative for dysphoric mood. The patient is not nervous/anxious.    Medications:   Allergies as of 01/15/2018      Reactions   Latex Other (See Comments)   REDNESS AT REACTION SITE.   Metformin Diarrhea   Rivaroxaban Other (See Comments)   Headaches, blurred vision   Other Other (See Comments)   BEE STINGS   Sotalol Other (See Comments)   "BLUE TOES"- cut his circulation off   Warfarin Sodium Other (See Comments)   REACTION: migraine headaches/vision impariment      Medication List       Accurate as of January 15, 2018 10:13 AM. Always use your most recent med list.        atorvastatin 80 MG tablet Commonly known as:  LIPITOR Take 80 mg by mouth at bedtime.   BENZEDREX Inha Generic drug:  Propylhexedrine Place 1 each into the nose daily as needed (for congestion).   ELIQUIS 5 MG Tabs tablet Generic drug:  apixaban Take 5 mg by mouth 2 (two) times daily.   EPIPEN 2-PAK 0.3 mg/0.3 mL Soaj injection Generic drug:  EPINEPHrine Inject 0.3 mg into the muscle once. USE ONLY IN EMERGENCY   fluticasone 50 MCG/ACT nasal spray Commonly known as:  FLONASE Place 1 spray into the nose daily as needed for rhinitis or allergies.   furosemide 20 MG tablet Commonly known as:  LASIX Take 1 tablet (20 mg total) by mouth daily.   ibuprofen 200 MG tablet Commonly known as:  ADVIL,MOTRIN Take 400 mg by mouth every 8 (eight) hours as needed for headache or moderate pain.   Magnesium 250 MG Tabs Take 250 mg by mouth daily.   metoprolol succinate 50 MG 24 hr tablet Commonly known as:  TOPROL-XL TAKE ONE TABLET BY MOUTH DAILY IMMEDIATELY FOLLOWING A MEAL   multivitamin with minerals Tabs tablet Take 1 tablet by mouth daily. Centrum Silver   nateglinide 120 MG tablet Commonly known as:  STARLIX Take 120 mg by mouth Twice daily.   pioglitazone 45 MG tablet Commonly known as:  ACTOS Take 45 mg by mouth  daily.   PRESERVISION AREDS 2 Caps Take 1 capsule by mouth 2 (two) times daily.   SF 5000 PLUS 1.1 % Crea dental cream Generic drug:  sodium fluoride Place 1 application onto teeth at bedtime.   sitaGLIPtin 100 MG tablet Commonly known as:  JANUVIA Take 100 mg by mouth at bedtime.   [name] 500 MCG capsule Generic drug:  dofetilide Take 1 capsule (500 mcg total) 2 (two) times daily by mouth.   umeclidinium-vilanterol 62.5-25 MCG/INH Aepb Commonly known as:  ANORO ELLIPTA Inhale 1 puff into the lungs daily.   Vitamin D-3 125 MCG (5000 UT) Tabs Take 5,000-10,000 Units by mouth See admin instructions. Take 5000 units by mouth on Sunday, Tuesday, Thursday, and Saturday. Take 10000 units by mouth on Monday, Wednesday, and Friday  Past Surgical History:  He  has a past surgical history that includes Mitral valve replacement; Coronary artery bypass graft (2001); Cardiac defibrillator placement; Hemorroidectomy; Knee arthroscopy; Cardiac catheterization (04/03/2007); Cardiac catheterization (02/06/2006); Cardiac catheterization (05/29/2000); Cardiac catheterization (10/25/1999); Cardiac catheterization (01/06/2003); transthoracic echocardiogram (04/02/2007); left heart catheterization with coronary angiogram (N/A, 07/04/2012); Cardiac catheterization (N/A, 11/06/2014); RIGHT/LEFT HEART CATH AND CORONARY/GRAFT ANGIOGRAPHY (N/A, 01/05/2017); INTRAVASCULAR PRESSURE WIRE/FFR STUDY (N/A, 01/05/2017); Hemorrhoid surgery; Cataract extraction w/ intraocular lens  implant, bilateral; and Foreign Body Removal (Right, 06/21/2017).  Family History:  His family history includes Cancer in his sister and another family member; Diabetes in an other family member; Emphysema in his father; Heart disease in his father, paternal grandfather, and paternal grandmother; Stroke in his mother.  Social History:  He  reports that he quit smoking about 34 years ago. He has never used smokeless tobacco. He reports current  alcohol use. He reports that he does not use drugs.

## 2018-01-15 NOTE — Progress Notes (Signed)
   Subjective:    Patient ID: [name], male    DOB: 11-02-1931, 84 y.o.   MRN: [id number]  HPI    Review of Systems  Constitutional: Negative for fever and unexpected weight change.  HENT: Negative for congestion, dental problem, ear pain, nosebleeds, postnasal drip, rhinorrhea, sinus pressure, sneezing, sore throat and trouble swallowing.   Eyes: Negative for redness and itching.  Respiratory: Positive for cough and shortness of breath. Negative for chest tightness and wheezing.   Cardiovascular: Positive for leg swelling. Negative for palpitations.  Gastrointestinal: Negative for nausea and vomiting.  Genitourinary: Negative for dysuria.  Musculoskeletal: Positive for joint swelling.  Skin: Negative for rash.  Allergic/Immunologic: Positive for environmental allergies. Negative for food allergies and immunocompromised state.  Neurological: Positive for headaches.  Hematological: Does not bruise/bleed easily.  Psychiatric/Behavioral: Negative for dysphoric mood. The patient is not nervous/anxious.        Objective:   Physical Exam        Assessment & Plan:

## 2018-02-05 ENCOUNTER — Ambulatory Visit (INDEPENDENT_AMBULATORY_CARE_PROVIDER_SITE_OTHER): Payer: Medicare Other

## 2018-02-05 DIAGNOSIS — I255 Ischemic cardiomyopathy: Secondary | ICD-10-CM | POA: Diagnosis not present

## 2018-02-06 NOTE — Progress Notes (Signed)
Remote ICD transmission.   

## 2018-02-07 DIAGNOSIS — B029 Zoster without complications: Secondary | ICD-10-CM | POA: Diagnosis not present

## 2018-02-07 DIAGNOSIS — R21 Rash and other nonspecific skin eruption: Secondary | ICD-10-CM | POA: Diagnosis not present

## 2018-02-07 LAB — CUP PACEART REMOTE DEVICE CHECK
Battery Remaining Longevity: 52 mo
Battery Voltage: 2.97 V
Brady Statistic AP VP Percent: 74.99 %
Brady Statistic AP VS Percent: 0.42 %
Brady Statistic AS VP Percent: 19.41 %
Brady Statistic AS VS Percent: 5.18 %
Brady Statistic RA Percent Paced: 73.29 %
Brady Statistic RV Percent Paced: 93.58 %
Date Time Interrogation Session: 20200107081606
HighPow Impedance: 39 Ohm
HighPow Impedance: 53 Ohm
Implantable Lead Implant Date: 20040309
Implantable Lead Implant Date: 20040309
Implantable Lead Location: 753859
Implantable Lead Location: 753860
Implantable Lead Model: 158
Implantable Lead Model: 5076
Implantable Lead Serial Number: 115061
Implantable Pulse Generator Implant Date: 20161007
Lead Channel Impedance Value: 399 Ohm
Lead Channel Impedance Value: 4047 Ohm
Lead Channel Impedance Value: 4047 Ohm
Lead Channel Impedance Value: 4047 Ohm
Lead Channel Impedance Value: 4047 Ohm
Lead Channel Impedance Value: 4047 Ohm
Lead Channel Impedance Value: 4047 Ohm
Lead Channel Impedance Value: 4047 Ohm
Lead Channel Impedance Value: 4047 Ohm
Lead Channel Impedance Value: 4047 Ohm
Lead Channel Impedance Value: 4047 Ohm
Lead Channel Impedance Value: 456 Ohm
Lead Channel Impedance Value: 456 Ohm
Lead Channel Pacing Threshold Amplitude: 0.5 V
Lead Channel Pacing Threshold Amplitude: 0.625 V
Lead Channel Pacing Threshold Pulse Width: 0.4 ms
Lead Channel Pacing Threshold Pulse Width: 0.4 ms
Lead Channel Sensing Intrinsic Amplitude: 2.125 mV
Lead Channel Sensing Intrinsic Amplitude: 2.125 mV
Lead Channel Sensing Intrinsic Amplitude: 7.625 mV
Lead Channel Sensing Intrinsic Amplitude: 7.625 mV
Lead Channel Setting Pacing Amplitude: 2 V
Lead Channel Setting Pacing Amplitude: 2.5 V
Lead Channel Setting Pacing Pulse Width: 0.4 ms
Lead Channel Setting Sensing Sensitivity: 0.3 mV

## 2018-02-18 ENCOUNTER — Other Ambulatory Visit (HOSPITAL_COMMUNITY): Payer: Self-pay | Admitting: Nurse Practitioner

## 2018-02-18 NOTE — Telephone Encounter (Signed)
Pt is a 85 yr old male who last saw Dr. Caryl Comes on 12/18/17, SCr on that visit was 0.86, weight on 01/15/18 was 95.7Kg. Will refill Eliquis 5mg  BID.

## 2018-02-20 ENCOUNTER — Telehealth: Payer: Self-pay | Admitting: Cardiovascular Disease

## 2018-02-20 ENCOUNTER — Encounter: Payer: Self-pay | Admitting: Internal Medicine

## 2018-02-20 ENCOUNTER — Ambulatory Visit (INDEPENDENT_AMBULATORY_CARE_PROVIDER_SITE_OTHER): Payer: Medicare Other | Admitting: Internal Medicine

## 2018-02-20 VITALS — BP 104/70 | HR 79 | Ht 74.0 in | Wt 210.2 lb

## 2018-02-20 DIAGNOSIS — Z79899 Other long term (current) drug therapy: Secondary | ICD-10-CM

## 2018-02-20 DIAGNOSIS — I472 Ventricular tachycardia, unspecified: Secondary | ICD-10-CM

## 2018-02-20 DIAGNOSIS — I5022 Chronic systolic (congestive) heart failure: Secondary | ICD-10-CM | POA: Diagnosis not present

## 2018-02-20 DIAGNOSIS — Z4502 Encounter for adjustment and management of automatic implantable cardiac defibrillator: Secondary | ICD-10-CM | POA: Diagnosis not present

## 2018-02-20 DIAGNOSIS — I255 Ischemic cardiomyopathy: Secondary | ICD-10-CM | POA: Diagnosis not present

## 2018-02-20 MED ORDER — LOSARTAN POTASSIUM 25 MG PO TABS: 25.0000 mg | ORAL_TABLET | Freq: Every day | ORAL | 3 refills | Status: DC

## 2018-02-20 MED ORDER — TIKOSYN 500 MCG PO CAPS: 500.0000 ug | ORAL_CAPSULE | Freq: Two times a day (BID) | ORAL | 3 refills | Status: DC

## 2018-02-20 NOTE — Patient Instructions (Addendum)
Medication Instruction: Your physician has recommended you make the following change in your medication:   1. Begin taking Losartan, 25mg  tablet once per day.  Labwork: Your physician recommends that you return for lab work in:   2 weeks for a BMP  Testing/Procedures: None ordered.  Follow-Up: Your physician recommends that you schedule a follow-up appointment in:   6 months with Dr Caryl Comes  Any Other Special Instructions Will Be Listed Below (If Applicable).     If you need a refill on your cardiac medications before your next appointment, please call your pharmacy.

## 2018-02-20 NOTE — Telephone Encounter (Signed)
New message     Pt c/o medication issue:  1. Name of Medication: [name] 500 MCG capsule   2. How are you currently taking this medication (dosage and times per day)?   3. Are you having a reaction (difficulty breathing--STAT)?   4. What is your medication issue? [name] 500 MCG capsule needs auth . She stated that we can use cover my meds or call pt insurance.

## 2018-02-20 NOTE — Progress Notes (Signed)
Patient Care Team: [name], MD as PCP - General (Family Medicine) Sherren Mocha, MD as PCP - Cardiology (Cardiology)   HPI  [name] is a 85 y.o. male Seen today because of   recent bout of persistent atrial fibrillation which was quite symptomatic and prompted a visit to the A. fib clinic with the plan for anticoagulation and TEE cardioversion.  He had failed home shocks x2.  He converted then spontaneously.  He remains on Eliquis at this time      He has a history of polymorphic ventricular tachycardia for which he has an ICD   He was seen November 2013 because of 2 episodes of polymorphic ventricular tachycardia occurring in the preceding few weeks after having been quiet for months and months. There was no clear inciting event. Beta blocker therapy was gently uptitrated.  No intercurrent VT He has frequent PVCs  History of coronary disease with prior CABG mitral valve repair both of which were done at [facility]  PFO closure at that time.    DATE TEST EF   6/14 LHC 35-40% LIMA/ RIMA patent   2/17 Echo   30 %   12/18 LHC   LIMA-LAD and right radial graft-OM- patent proximal cx stenosis with negative FFR  11/19 Echo  35-40% LAE Severe (69/3.0/63) Pulm HTN 50    He Is on dofetilide for his arrhythmias  .    Date Cr K Mg Hgb  9/16  0.99 4.2 2.1   12/17  1.08 4.6 2.1   5/19 1.00 4.5 2.2   10/19 1.04 4.4  13.2 (11/19)          He has had persistent problems with atrial fibrillation over the last couple of weeks.  This is been temporally associated with worsening dyspnea on exertion.  He has had more peripheral edema. Denies chest pain.  A few years ago he had an evaluation and was thought to have COPD.  He was treated with an inhaler with a significant improvement.  Has not been seen since because of the cost of medication.  He was started on Eliquis as noted above.  He has complaints of dizziness which is been non-positional as well as double vision  which was not clearly associated with rotation of the head.  There is also dyspnea; literature review suggested dyspnea could rarely be associated with Eliquis and he was changed to dabigatran. BNP 1465 (cutoff for age 79) failed to tolerate dabigatran secondary to GE reflux.  No changes were noted with his dyspnea on dabigatran.  Resumption of apixaban without any changes in dyspnea  Is seeing pulmonary and has been treated with bronchodilators with a significant improvement in respiratory function and a concomitant diminution in atrial fibrillation frequency.  No chest pain.  No edema.    Device History: STJ dual chamber ICD implanted 2004 for PMVT, ICM; generator change 2010; gen change 2016 (MDT device) History of appropriate therapy: Yes History of AAD therapy: Yes   Past Medical History:  Diagnosis Date  . AICD (automatic cardioverter/defibrillator) present    Medtronicn PPM/ICD  . Coronary artery disease    [facility]  . Diabetes mellitus   . Dysrhythmia   . Endocarditis, valve unspecified    Mitral  . Headache    hx migraines 6-7 x mo  . History of migraine headaches   . History of seasonal allergies   . ICD (implantable cardiac defibrillator) in place   . PAF (paroxysmal atrial fibrillation) (  [place])    NO LAA occlusion at the time of surgery  [name] 2018  . Pleural effusion   . PVC's (premature ventricular contractions)   . Ventricular tachycardia, polymorphic ([place])    status post ICD implantation    Past Surgical History:  Procedure Laterality Date  . CARDIAC CATHETERIZATION  04/03/2007   EF 40%  . CARDIAC CATHETERIZATION  02/06/2006   EF 45%. ANTERIOR HYPOKINESIS  . CARDIAC CATHETERIZATION  05/29/2000   EF 50%. MILD ANTERIOR HYPOKINESIS  . CARDIAC CATHETERIZATION  10/25/1999   EF 50%. SEVERE MITRAL REGURGITATION  . CARDIAC CATHETERIZATION  01/06/2003   EF 50%  . CARDIAC DEFIBRILLATOR PLACEMENT    . CATARACT EXTRACTION W/ INTRAOCULAR LENS   IMPLANT, BILATERAL    . CORONARY ARTERY BYPASS GRAFT  2001   2 VESSEL CABG AND MITRAL VALVE REPAIR. HE HAD A LIMA GRAFT TO THE LAD AND RADIAL ARTERY  GRAFT TO THE OBTUSE MARGINAL  OF LEFT CIRCUMFLEX  . EP IMPLANTABLE DEVICE N/A 11/06/2014   Procedure: ICD Generator Changeout;  Surgeon: Deboraha Sprang, MD;  Location: [place] [facility];  Service: Cardiovascular;  Laterality: N/A;  . FOREIGN BODY REMOVAL Right 06/21/2017   Procedure: FOREIGN BODY REMOVAL ADULT RIGHT RING FINGER;  Surgeon: Leanora Cover, MD;  Location: [place];  Service: Orthopedics;  Laterality: Right;  . HEMORRHOID SURGERY    . HEMORROIDECTOMY    . INTRAVASCULAR PRESSURE WIRE/FFR STUDY N/A 01/05/2017   Procedure: INTRAVASCULAR PRESSURE WIRE/FFR STUDY;  Surgeon: Sherren Mocha, MD;  Location: [place] [facility];  Service: Cardiovascular;  Laterality: N/A;  . KNEE ARTHROSCOPY     RIGHT KNEE  . LEFT HEART CATHETERIZATION WITH CORONARY ANGIOGRAM N/A 07/04/2012   Procedure: LEFT HEART CATHETERIZATION WITH CORONARY ANGIOGRAM;  Surgeon: Sherren Mocha, MD;  Location: [facility];  Service: Cardiovascular;  Laterality: N/A;  . MITRAL VALVE REPLACEMENT     No LAA occlusion at the time of surgery  Karie Soda report 2018  . RIGHT/LEFT HEART CATH AND CORONARY/GRAFT ANGIOGRAPHY N/A 01/05/2017   Procedure: RIGHT/LEFT HEART CATH AND CORONARY/GRAFT ANGIOGRAPHY;  Surgeon: Sherren Mocha, MD;  Location: [place] [facility];  Service: Cardiovascular;  Laterality: N/A;  . TRANSTHORACIC ECHOCARDIOGRAM  04/02/2007   EF 35%    Current Outpatient Medications  Medication Sig Dispense Refill  . atorvastatin (LIPITOR) 80 MG tablet Take 80 mg by mouth at bedtime.     . Cholecalciferol (VITAMIN D-3) 5000 UNITS TABS Take 5,000-10,000 Units by mouth See admin instructions. Take 5000 units by mouth on Sunday, Tuesday, Thursday, and Saturday. Take 10000 units by mouth on Monday, Wednesday, and Friday    . ELIQUIS 5 MG TABS tablet TAKE ONE TABLET BY MOUTH TWICE DAILY  60 tablet 6  . EPIPEN 2-PAK 0.3 MG/0.3ML SOAJ injection Inject 0.3 mg into the muscle once. USE ONLY IN EMERGENCY    . fluticasone (FLONASE) 50 MCG/ACT nasal spray Place 1 spray into the nose daily as needed for rhinitis or allergies.    . furosemide (LASIX) 20 MG tablet Take 1 tablet (20 mg total) by mouth daily. 90 tablet 3  . ibuprofen (ADVIL,MOTRIN) 200 MG tablet Take 400 mg by mouth every 8 (eight) hours as needed for headache or moderate pain.     . Magnesium 250 MG TABS Take 250 mg by mouth daily.     . metoprolol succinate (TOPROL-XL) 50 MG 24 hr tablet TAKE ONE TABLET BY MOUTH DAILY IMMEDIATELY FOLLOWING A MEAL 90 tablet 3  .  Multiple Vitamin (MULTIVITAMIN WITH MINERALS) TABS Take 1 tablet by mouth daily. Centrum Silver    . Multiple Vitamins-Minerals (PRESERVISION AREDS 2) CAPS Take 1 capsule by mouth 2 (two) times daily.     . nateglinide (STARLIX) 120 MG tablet Take 120 mg by mouth Twice daily.     . pioglitazone (ACTOS) 45 MG tablet Take 45 mg by mouth daily.      Marland Kitchen Propylhexedrine (BENZEDREX) INHA Place 1 each into the nose daily as needed (for congestion).    . SF 5000 PLUS 1.1 % CREA dental cream Place 1 application onto teeth at bedtime.   6  . sitaGLIPtin (JANUVIA) 100 MG tablet Take 100 mg by mouth at bedtime.     Marland Kitchen [name] 500 MCG capsule Take 1 capsule (500 mcg total) by mouth 2 (two) times daily. 180 capsule 3  . umeclidinium-vilanterol (ANORO ELLIPTA) 62.5-25 MCG/INH AEPB Inhale 1 puff into the lungs daily. 1 each 5   No current facility-administered medications for this visit.     Allergies  Allergen Reactions  . Latex Other (See Comments)    REDNESS AT REACTION SITE.  Marland Kitchen Metformin Diarrhea  . Rivaroxaban Other (See Comments)    Headaches, blurred vision  . Other Other (See Comments)    BEE STINGS   . Sotalol Other (See Comments)    "BLUE TOES"- cut his circulation off  . Warfarin Sodium Other (See Comments)    REACTION: migraine headaches/vision impariment     Review of Systems negative except from HPI and PMH  Physical Exam BP 104/70   Pulse 79   Ht 6\' 2"  (1.88 m)   Wt 210 lb 3.2 oz (95.3 kg)   SpO2 99%   BMI 26.99 kg/m  Well developed and nourished in no acute distress HENT normal Neck supple with JVP-flat Clear Regular rate and rhythm, 2/6 m Abd-soft with active BS No Clubbing cyanosis edema Skin-warm and dry A & Oriented  Grossly normal sensory and motor function  ECG AV pacing     Assessment and  Plan  Ischemic heart disease with prior bypass  Ischemic cardiomyopathy ejection 35-40%  Congestive heart failure-chronic-systolic  Implantable defibrillator-Medtronic  The patient's device was interrogated.  The information was reviewed.  Device was reprogrammed as below  PVCs   Atrial fibrillation persistent  Dofetilide therapy  Ventricular tachycardia nonsustained    COPD-reactive airways disease  Patient's atrial fibrillation burden is demonstrably and clinically decreased concomitant with treatment of his reactive airways disease.  He is quite pleased.  We will continue dofetilide.  He is now on generic as the patient assistance program is no longer available.  No VT  Without symptoms of ischemia  Euvolemic continue current meds  PVCs more queiscent.  With a measured decrease in frequency of V sensed event from 8.6--5.4  We spent more than 50% of our >25 min visit in face to face counseling regarding the above

## 2018-02-21 LAB — CUP PACEART INCLINIC DEVICE CHECK
Date Time Interrogation Session: 20200123113324
Implantable Lead Implant Date: 20040309
Implantable Lead Implant Date: 20040309
Implantable Lead Location: 753859
Implantable Lead Location: 753860
Implantable Lead Model: 158
Implantable Lead Model: 5076
Implantable Lead Serial Number: 115061
Implantable Pulse Generator Implant Date: 20161007

## 2018-02-21 NOTE — Progress Notes (Signed)
@Patient  ID: [name], male    DOB: September 20, 1931, 85 y.o.   MRN: [id number]  Chief Complaint  Patient presents with  . Follow-up    PFT results    Referring provider: [name], MD  HPI:  85 year old male former smoker followed in our office for SOB and presumed COPD  PMH: CAD, VT, PAF, and systolic CHF. Smoker/ Smoking History: Former Smoker. 5 pack year smoking history. Quit in 1960s Maintenance:  Anoro Ellipta  Pt of: Dr. Halford Chessman  02/22/2018  - Visit   85 year old male former smoker presenting to our office today after completing pulmonary function test.  Pulmonary function test results are listed below:  02/22/2018-pulmonary function test-FVC 3.31 (75% predicted), postbronchodilator ratio of 74, postbronchodilator FEV1 2.55 (82 percent predicted), mid flow reversibility, no significant bronchodilator response, DLCO 52, concavity in flow volume loops >>> Patient has used Anoro Ellipta this morning before testing  Patient does report that shortness of breath is improved since starting Anoro Ellipta but unfortunately now he has a persistent dry cough after starting the Anoro Ellipta.  This may be due to the dry powder of the Anoro Ellipta inhaler.  MMRC - Breathlessness Score 1 - I get short of breath when hurrying on level ground or walking up a slight hill     Tests:  Echo 12/26/17 >> EF 35 to 40%, mild MS and MR, PAS 50 mmHg  01/19/2003-CT chest with contrast- COPD, hyperinflated, no evidence of PE  02/22/2018-pulmonary function test-FVC 3.31 (75% predicted), postbronchodilator ratio of 74, postbronchodilator FEV1 2.55 (82 percent predicted), mid flow reversibility, no significant bronchodilator response, DLCO 52, concavity in flow volume loops >>> Patient has used Anoro Ellipta this morning before testing  FENO:  No results found for: NITRICOXIDE  PFT: PFT Results Latest Ref Rng & Units 02/22/2018  FVC-Pre L 3.31  FVC-Predicted Pre % 75  FVC-Post L 3.46    FVC-Predicted Post % 79  Pre FEV1/FVC % % 71  Post FEV1/FCV % % 74  FEV1-Pre L 2.36  FEV1-Predicted Pre % 76  FEV1-Post L 2.55  DLCO UNC% % 52  DLCO COR %Predicted % 74  TLC L 5.82  TLC % Predicted % 75  RV % Predicted % 91    Imaging: No results found.    Specialty Problems      Pulmonary Problems   Asthma-COPD overlap syndrome (HCC)    02/22/2018-pulmonary function test-FVC 3.31 (75% predicted), postbronchodilator ratio of 74, postbronchodilator FEV1 2.55 (82 percent predicted), mid flow reversibility, no significant bronchodilator response, DLCO 52, concavity in flow volume loops >>> Patient has used Anoro Ellipta this morning before testing      Cough      Allergies  Allergen Reactions  . Latex Other (See Comments)    REDNESS AT REACTION SITE.  Marland Kitchen Metformin Diarrhea  . Rivaroxaban Other (See Comments)    Headaches, blurred vision  . Other Other (See Comments)    BEE STINGS   . Sotalol Other (See Comments)    "BLUE TOES"- cut his circulation off  . Warfarin Sodium Other (See Comments)    REACTION: migraine headaches/vision impariment    Immunization History  Administered Date(s) Administered  . Influenza Split 11/07/2017   Patient reports he has received the pneumonia vaccines, we will contact primary care to get those results  Past Medical History:  Diagnosis Date  . AICD (automatic cardioverter/defibrillator) present    Medtronicn PPM/ICD  . Coronary artery disease    [facility]  . Diabetes mellitus   . Dysrhythmia   . Endocarditis, valve unspecified    Mitral  . Headache    hx migraines 6-7 x mo  . History of migraine headaches   . History of seasonal allergies   . ICD (implantable cardiac defibrillator) in place   . PAF (paroxysmal atrial fibrillation) ([place])    NO LAA occlusion at the time of surgery  [name] 2018  . Pleural effusion   . PVC's (premature ventricular contractions)   . Ventricular tachycardia,  polymorphic (HCC)    status post ICD implantation    Tobacco History: Social History   Tobacco Use  Smoking Status Former Smoker  . Packs/day: 1.00  . Years: 5.00  . Pack years: 5.00  . Types: Cigarettes  . Start date: 02/23/1959  . Last attempt to quit: 01/31/1964  . Years since quitting: 54.0  Smokeless Tobacco Never Used   Counseling given: Yes  Continue not smoke  Outpatient Encounter Medications as of 02/22/2018  Medication Sig  . atorvastatin (LIPITOR) 80 MG tablet Take 80 mg by mouth at bedtime.   . Cholecalciferol (VITAMIN D-3) 5000 UNITS TABS Take 5,000-10,000 Units by mouth See admin instructions. Take 5000 units by mouth on Sunday, Tuesday, Thursday, and Saturday. Take 10000 units by mouth on Monday, Wednesday, and Friday  . ELIQUIS 5 MG TABS tablet TAKE ONE TABLET BY MOUTH TWICE DAILY  . EPIPEN 2-PAK 0.3 MG/0.3ML SOAJ injection Inject 0.3 mg into the muscle once. USE ONLY IN EMERGENCY  . fluticasone (FLONASE) 50 MCG/ACT nasal spray Place 1 spray into the nose daily as needed for rhinitis or allergies.  . furosemide (LASIX) 20 MG tablet Take 1 tablet (20 mg total) by mouth daily. (Patient taking differently: Take 20 mg by mouth as needed. )  . ibuprofen (ADVIL,MOTRIN) 200 MG tablet Take 400 mg by mouth every 8 (eight) hours as needed for headache or moderate pain.   Marland Kitchen losartan (COZAAR) 25 MG tablet Take 1 tablet (25 mg total) by mouth daily.  . Magnesium 250 MG TABS Take 250 mg by mouth daily.   . metoprolol succinate (TOPROL-XL) 50 MG 24 hr tablet TAKE ONE TABLET BY MOUTH DAILY IMMEDIATELY FOLLOWING A MEAL  . Multiple Vitamin (MULTIVITAMIN WITH MINERALS) TABS Take 1 tablet by mouth daily. Centrum Silver  . Multiple Vitamins-Minerals (PRESERVISION AREDS 2) CAPS Take 1 capsule by mouth 2 (two) times daily.   . nateglinide (STARLIX) 120 MG tablet Take 120 mg by mouth Twice daily.   . pioglitazone (ACTOS) 45 MG tablet Take 45 mg by mouth daily.    Marland Kitchen Propylhexedrine  (BENZEDREX) INHA Place 1 each into the nose daily as needed (for congestion).  . SF 5000 PLUS 1.1 % CREA dental cream Place 1 application onto teeth at bedtime.   . sitaGLIPtin (JANUVIA) 100 MG tablet Take 100 mg by mouth at bedtime.   Marland Kitchen [name] 500 MCG capsule Take 1 capsule (500 mcg total) by mouth 2 (two) times daily.  Marland Kitchen umeclidinium-vilanterol (ANORO ELLIPTA) 62.5-25 MCG/INH AEPB Inhale 1 puff into the lungs daily.  . Tiotropium Bromide-Olodaterol (STIOLTO RESPIMAT) 2.5-2.5 MCG/ACT AERS Inhale 2 puffs into the lungs daily.   No facility-administered encounter medications on file as of 02/22/2018.      Review of Systems  Review of Systems  Constitutional: Negative for activity change, chills, fatigue, fever and unexpected weight change.  HENT: Negative for congestion, postnasal drip, rhinorrhea and sore throat.   Eyes: Negative.  Respiratory: Positive for cough (dry cough ). Negative for shortness of breath and wheezing.   Cardiovascular: Negative for chest pain and palpitations.  Gastrointestinal: Negative for constipation, diarrhea, nausea and vomiting.  Endocrine: Negative.   Musculoskeletal: Negative.   Skin: Negative.   Neurological: Negative for dizziness and headaches.  Psychiatric/Behavioral: Negative.  Negative for dysphoric mood. The patient is not nervous/anxious.   All other systems reviewed and are negative.    Physical Exam  BP 96/60 (BP Location: Left Arm, Cuff Size: Normal)   Pulse 82   Ht 6\' 1"  (1.854 m)   Wt 210 lb (95.3 kg)   SpO2 97%   BMI 27.71 kg/m   Wt Readings from Last 5 Encounters:  02/22/18 210 lb (95.3 kg)  02/20/18 210 lb 3.2 oz (95.3 kg)  01/15/18 211 lb (95.7 kg)  12/21/17 214 lb (97.1 kg)  12/18/17 219 lb 6.4 oz (99.5 kg)    Physical Exam  Constitutional: He is oriented to person, place, and time and well-developed, well-nourished, and in no distress. No distress.  HENT:  Head: Normocephalic and atraumatic.  Right Ear: Hearing,  tympanic membrane, external ear and ear canal normal.  Left Ear: Hearing, tympanic membrane, external ear and ear canal normal.  Nose: Mucosal edema present. Right sinus exhibits no maxillary sinus tenderness and no frontal sinus tenderness. Left sinus exhibits no maxillary sinus tenderness and no frontal sinus tenderness.  Mouth/Throat: Uvula is midline and oropharynx is clear and moist. No oropharyngeal exudate.  + Healing right nasal mucosa  Eyes: Pupils are equal, round, and reactive to light.  Neck: Normal range of motion. Neck supple.  Cardiovascular: Normal rate, regular rhythm and normal heart sounds.  Pulmonary/Chest: Effort normal and breath sounds normal. No accessory muscle usage. No respiratory distress. He has no decreased breath sounds. He has no wheezes. He has no rhonchi. He has no rales.  Musculoskeletal: Normal range of motion.        General: No edema.  Lymphadenopathy:    He has no cervical adenopathy.  Neurological: He is alert and oriented to person, place, and time. Gait normal.  Skin: Skin is warm and dry. He is not diaphoretic. No erythema.  Psychiatric: Mood, memory, affect and judgment normal.  Nursing note and vitals reviewed.   02/22/2018-walk in office today on room air-patient completed 3 laps oxygenation did not drop heart rate remained stable  Lab Results:  CBC    Component Value Date/Time   WBC 4.6 12/18/2017 1506   WBC 4.6 06/20/2017 1344   RBC 3.93 (L) 12/18/2017 1506   RBC 4.19 (L) 06/20/2017 1344   HGB 13.2 12/18/2017 1506   HCT 38.9 12/18/2017 1506   PLT 156 12/18/2017 1506   MCV 99 (H) 12/18/2017 1506   MCH 33.6 (H) 12/18/2017 1506   MCH 32.2 06/20/2017 1344   MCHC 33.9 12/18/2017 1506   MCHC 33.1 06/20/2017 1344   RDW 13.1 12/18/2017 1506   LYMPHSABS 0.9 12/27/2016 1523   MONOABS 407 08/25/2015 1015   EOSABS 0.2 12/27/2016 1523   BASOSABS 0.0 12/27/2016 1523    BMET    Component Value Date/Time   NA 136 12/18/2017 1506   K 4.7  12/18/2017 1506   CL 99 12/18/2017 1506   CO2 25 12/18/2017 1506   GLUCOSE 118 (H) 12/18/2017 1506   GLUCOSE 151 (H) 06/20/2017 1344   BUN 13 12/18/2017 1506   CREATININE 0.86 12/18/2017 1506   CREATININE 1.08 01/04/2016 1630   CALCIUM 8.9 12/18/2017 1506  GFRNONAA 80 12/18/2017 1506   GFRAA 92 12/18/2017 1506    BNP No results found for: BNP  ProBNP    Component Value Date/Time   PROBNP 1,465 (H) 12/18/2017 1506   PROBNP 463.0 (H) 04/02/2007 0603      Assessment & Plan:     Asthma-COPD overlap syndrome (HCC) Assessment: mMRC 1 Maintained well on Anoro Ellipta New symptom of dry cough likely related to dry powder inhaler and patient to rinse the mouth Improved shortness of breath since starting Anoro Ellipta Pulmonary function test today revealing concavity and flow volume loops and likely sub-70 ratio if patient had not taking Anoro Ellipta today Postbronchodilator FEV1 82% 2004 CT imaging noting COPD no reference to emphysema >>> DLCO on PFT today is 52 Up-to-date on flu and pneumonia vaccines per patient Lungs clear to auscultation today  Plan: Continue patient on Anoro Ellipta at this time we will have patient start rinsing mouth out after use If symptoms of cough persist then patient can start using Stiolto Respimat sample provided today patient instructed on how to use inhaler Walk in office today on room air patient completed 3 laps on room air with no desaturations Follow-up with our office in 6 to 8 weeks to see how he is doing on his inhaler regimen Due to patient's walk in office today will defer CT imaging at this time Patient reports his concerned about the cost of his Anoro Ellipta discussed [place] for you paperwork and explained that once he had $600 in cost of his medications out-of-pocket he can apply for this consider this at future office visits  Cough Assessment: Increased cough after starting Anoro Ellipta 4 weeks ago  Plan: Start rinsing  mouth after normal Ellipta use If symptoms persist can transition from [place] to [place] sample provided today     Lauraine Rinne, NP 02/22/2018   This appointment was 35 minutes long with over 50% of the time in direct face-to-face patient care, assessment, plan of care, and follow-up.

## 2018-02-22 ENCOUNTER — Encounter: Payer: Self-pay | Admitting: Pulmonary Disease

## 2018-02-22 ENCOUNTER — Telehealth: Payer: Self-pay

## 2018-02-22 ENCOUNTER — Ambulatory Visit (INDEPENDENT_AMBULATORY_CARE_PROVIDER_SITE_OTHER): Payer: Medicare Other | Admitting: Pulmonary Disease

## 2018-02-22 DIAGNOSIS — R0609 Other forms of dyspnea: Secondary | ICD-10-CM

## 2018-02-22 DIAGNOSIS — J449 Chronic obstructive pulmonary disease, unspecified: Secondary | ICD-10-CM | POA: Diagnosis not present

## 2018-02-22 DIAGNOSIS — R05 Cough: Secondary | ICD-10-CM

## 2018-02-22 DIAGNOSIS — R0600 Dyspnea, unspecified: Secondary | ICD-10-CM

## 2018-02-22 DIAGNOSIS — J4489 Other specified chronic obstructive pulmonary disease: Secondary | ICD-10-CM | POA: Insufficient documentation

## 2018-02-22 DIAGNOSIS — R059 Cough, unspecified: Secondary | ICD-10-CM

## 2018-02-22 LAB — PULMONARY FUNCTION TEST
DL/VA % pred: 74 %
DL/VA: 3.54 ml/min/mmHg/L
DLCO unc % pred: 52 %
DLCO unc: 19.09 ml/min/mmHg
FEF 25-75 Post: 2.08 L/sec
FEF 25-75 Pre: 1.47 L/sec
FEF2575-%Change-Post: 41 %
FEF2575-%Pred-Post: 100 %
FEF2575-%Pred-Pre: 71 %
FEV1-%Change-Post: 8 %
FEV1-%Pred-Post: 82 %
FEV1-%Pred-Pre: 76 %
FEV1-Post: 2.55 L
FEV1-Pre: 2.36 L
FEV1FVC-%Change-Post: 3 %
FEV1FVC-%Pred-Pre: 100 %
FEV6-%Change-Post: 4 %
FEV6-%Pred-Post: 83 %
FEV6-%Pred-Pre: 79 %
FEV6-Post: 3.41 L
FEV6-Pre: 3.26 L
FEV6FVC-%Change-Post: 0 %
FEV6FVC-%Pred-Post: 105 %
FEV6FVC-%Pred-Pre: 105 %
FVC-%Change-Post: 4 %
FVC-%Pred-Post: 79 %
FVC-%Pred-Pre: 75 %
FVC-Post: 3.46 L
FVC-Pre: 3.31 L
Post FEV1/FVC ratio: 74 %
Post FEV6/FVC ratio: 98 %
Pre FEV1/FVC ratio: 71 %
Pre FEV6/FVC Ratio: 99 %
RV % pred: 91 %
RV: 2.65 L
TLC % pred: 75 %
TLC: 5.82 L

## 2018-02-22 MED ORDER — TIOTROPIUM BROMIDE-OLODATEROL 2.5-2.5 MCG/ACT IN AERS: 2.0000 | INHALATION_SPRAY | Freq: Every day | RESPIRATORY_TRACT | 0 refills | Status: DC

## 2018-02-22 NOTE — Progress Notes (Signed)
Reviewed and agree with assessment/plan.   Meggie Laseter, MD [facility] 01/26/2016, 12:24 PM Pager:  [phone]  

## 2018-02-22 NOTE — Assessment & Plan Note (Addendum)
Assessment: mMRC 1 Maintained well on Anoro Ellipta New symptom of dry cough likely related to dry powder inhaler and patient to rinse the mouth Improved shortness of breath since starting Anoro Ellipta Pulmonary function test today revealing concavity and flow volume loops and likely sub-70 ratio if patient had not taking Anoro Ellipta today Postbronchodilator FEV1 82% 2004 CT imaging noting COPD no reference to emphysema >>> DLCO on PFT today is 52 Up-to-date on flu and pneumonia vaccines per patient Lungs clear to auscultation today  Plan: Continue patient on Anoro Ellipta at this time we will have patient start rinsing mouth out after use If symptoms of cough persist then patient can start using Stiolto Respimat sample provided today patient instructed on how to use inhaler Walk in office today on room air patient completed 3 laps on room air with no desaturations Follow-up with our office in 6 to 8 weeks to see how he is doing on his inhaler regimen Due to patient's walk in office today will defer CT imaging at this time Patient reports his concerned about the cost of his Anoro Ellipta discussed [place] for you paperwork and explained that once he had $600 in cost of his medications out-of-pocket he can apply for this consider this at future office visits

## 2018-02-22 NOTE — Patient Instructions (Signed)
Continue Anoro Ellipta as explained in office today  Anoro Ellipta  >>> Take 1 puff daily in the morning right when you wake up >>>Rinse your mouth out after use >>>This is a daily maintenance inhaler, NOT a rescue inhaler >>>Contact our office if you are having difficulties affording or obtaining this medication >>>It is important for you to be able to take this daily and not miss any doses   If your symptoms continue to happen i.e. the dry cough, then you can try the sample of Stiolto that we have given you today this may help you is not a dry powder inhaler and it is a missed.  See instructions below  Stiolto Respimat inhaler >>>2 puffs daily >>>Take this no matter what >>>This is not a rescue inhaler  Do not use Stiolto and Anoro Ellipta together at the same time  Walk today in office on room air  >>> Tolerated well we will hold off on CT imaging at this time    Follow-up in 6 to 8 weeks with Dr. Halford Chessman or [name] NP     It is flu season:   >>>Remember to be washing your hands regularly, using hand sanitizer, be careful to use around herself with has contact with people who are sick will increase her chances of getting sick yourself. >>> Best ways to protect herself from the flu: Receive the yearly flu vaccine, practice good hand hygiene washing with soap and also using hand sanitizer when available, eat a nutritious meals, get adequate rest, hydrate appropriately   Please contact the office if your symptoms worsen or you have concerns that you are not improving.   Thank you for choosing [facility] for your healthcare, and for allowing [name] to partner with you on your healthcare journey. I am thankful to be able to provide care to you today.   [name] FNP-C

## 2018-02-22 NOTE — Telephone Encounter (Signed)
I have done this PA through covermymeds.

## 2018-02-22 NOTE — Progress Notes (Signed)
PFT done today. 

## 2018-02-22 NOTE — Telephone Encounter (Signed)
**Note De-Identified  Obfuscation** I have done a [name] PA through covermymeds. Key: [id number]

## 2018-02-22 NOTE — Telephone Encounter (Signed)
[name] Approved Today  Effective from 02/22/2018 through 02/23/2019.

## 2018-02-22 NOTE — Assessment & Plan Note (Signed)
Assessment: Increased cough after starting Anoro Ellipta 4 weeks ago  Plan: Start rinsing mouth after normal Ellipta use If symptoms persist can transition from [employer] to [employer] provided today

## 2018-02-25 NOTE — Telephone Encounter (Signed)
02/25/2018 1246  Thank you for sending me this message.  Below I have listed before inhalers that you have tried:   Anoro Ellipta 62.5/25 MCG/INH 1 puff Inhalation once a day.  Incruse Ellipta 62.5 MCG/INH 1 puff Inhalation once a day.  Stiolto Respimat 2.5 mcg/2.5 mcg per actuation 2 inhalations orally Once daily.  Spiriva Respimat 2.5 mcg/actuation 2 inhalations once daily.   This is helpful information because this lets me know that you have never been tried on a inhaled corticosteroid/ LABA such as Symbicort.  It does confirm that you have already been trialed on Stiolto Respimat.  Which is what we provided for you as a sample.  Hypothetically in the future we could trial you on an inhaled corticosteroid/LABA to assess to see if you have a better response based off of your pulmonary function testing.  How have you been doing on the Anoro? With rinsing your mouth out has the cough improved?  Thank you for following up with me,  [name], FNP

## 2018-02-26 MED ORDER — DOFETILIDE 500 MCG PO CAPS: 500.0000 ug | ORAL_CAPSULE | Freq: Two times a day (BID) | ORAL | 3 refills | Status: DC

## 2018-02-28 ENCOUNTER — Encounter

## 2018-02-28 ENCOUNTER — Ambulatory Visit: Payer: Medicare Other | Admitting: Neurology

## 2018-03-03 ENCOUNTER — Inpatient Hospital Stay (HOSPITAL_COMMUNITY)
Admission: EM | Admit: 2018-03-03 | Discharge: 2018-03-07 | DRG: 536 | Disposition: A | Discharge status: Skilled Nursing Facility | Payer: Medicare Other | Attending: Internal Medicine | Admitting: Internal Medicine

## 2018-03-03 DIAGNOSIS — Z9841 Cataract extraction status, right eye: Secondary | ICD-10-CM

## 2018-03-03 DIAGNOSIS — I951 Orthostatic hypotension: Secondary | ICD-10-CM | POA: Diagnosis present

## 2018-03-03 DIAGNOSIS — R55 Syncope and collapse: Secondary | ICD-10-CM | POA: Diagnosis not present

## 2018-03-03 DIAGNOSIS — Z9842 Cataract extraction status, left eye: Secondary | ICD-10-CM

## 2018-03-03 DIAGNOSIS — E1151 Type 2 diabetes mellitus with diabetic peripheral angiopathy without gangrene: Secondary | ICD-10-CM | POA: Diagnosis present

## 2018-03-03 DIAGNOSIS — S32301A Unspecified fracture of right ilium, initial encounter for closed fracture: Secondary | ICD-10-CM

## 2018-03-03 DIAGNOSIS — I480 Paroxysmal atrial fibrillation: Secondary | ICD-10-CM | POA: Diagnosis present

## 2018-03-03 DIAGNOSIS — S299XXA Unspecified injury of thorax, initial encounter: Secondary | ICD-10-CM | POA: Diagnosis not present

## 2018-03-03 DIAGNOSIS — I472 Ventricular tachycardia: Secondary | ICD-10-CM | POA: Diagnosis not present

## 2018-03-03 DIAGNOSIS — I255 Ischemic cardiomyopathy: Secondary | ICD-10-CM | POA: Diagnosis present

## 2018-03-03 DIAGNOSIS — I5022 Chronic systolic (congestive) heart failure: Secondary | ICD-10-CM | POA: Diagnosis present

## 2018-03-03 DIAGNOSIS — Z7901 Long term (current) use of anticoagulants: Secondary | ICD-10-CM

## 2018-03-03 DIAGNOSIS — Z8249 Family history of ischemic heart disease and other diseases of the circulatory system: Secondary | ICD-10-CM

## 2018-03-03 DIAGNOSIS — R52 Pain, unspecified: Secondary | ICD-10-CM | POA: Diagnosis not present

## 2018-03-03 DIAGNOSIS — Z95810 Presence of automatic (implantable) cardiac defibrillator: Secondary | ICD-10-CM | POA: Diagnosis present

## 2018-03-03 DIAGNOSIS — I4891 Unspecified atrial fibrillation: Secondary | ICD-10-CM | POA: Diagnosis present

## 2018-03-03 DIAGNOSIS — S51011A Laceration without foreign body of right elbow, initial encounter: Secondary | ICD-10-CM | POA: Diagnosis not present

## 2018-03-03 DIAGNOSIS — Z7984 Long term (current) use of oral hypoglycemic drugs: Secondary | ICD-10-CM

## 2018-03-03 DIAGNOSIS — I25119 Atherosclerotic heart disease of native coronary artery with unspecified angina pectoris: Secondary | ICD-10-CM | POA: Diagnosis present

## 2018-03-03 DIAGNOSIS — Z952 Presence of prosthetic heart valve: Secondary | ICD-10-CM

## 2018-03-03 DIAGNOSIS — I959 Hypotension, unspecified: Secondary | ICD-10-CM | POA: Diagnosis not present

## 2018-03-03 DIAGNOSIS — Z8679 Personal history of other diseases of the circulatory system: Secondary | ICD-10-CM

## 2018-03-03 DIAGNOSIS — Z79899 Other long term (current) drug therapy: Secondary | ICD-10-CM

## 2018-03-03 DIAGNOSIS — Z91040 Latex allergy status: Secondary | ICD-10-CM

## 2018-03-03 DIAGNOSIS — Z87891 Personal history of nicotine dependence: Secondary | ICD-10-CM

## 2018-03-03 DIAGNOSIS — I4819 Other persistent atrial fibrillation: Secondary | ICD-10-CM | POA: Diagnosis present

## 2018-03-03 DIAGNOSIS — W19XXXA Unspecified fall, initial encounter: Secondary | ICD-10-CM | POA: Diagnosis present

## 2018-03-03 DIAGNOSIS — I4729 Other ventricular tachycardia: Secondary | ICD-10-CM

## 2018-03-03 DIAGNOSIS — R42 Dizziness and giddiness: Secondary | ICD-10-CM | POA: Diagnosis not present

## 2018-03-03 DIAGNOSIS — S0990XA Unspecified injury of head, initial encounter: Secondary | ICD-10-CM | POA: Diagnosis not present

## 2018-03-03 DIAGNOSIS — J449 Chronic obstructive pulmonary disease, unspecified: Secondary | ICD-10-CM | POA: Diagnosis present

## 2018-03-03 DIAGNOSIS — S32391A Other fracture of right ilium, initial encounter for closed fracture: Secondary | ICD-10-CM | POA: Diagnosis not present

## 2018-03-03 DIAGNOSIS — Z803 Family history of malignant neoplasm of breast: Secondary | ICD-10-CM

## 2018-03-03 DIAGNOSIS — Z833 Family history of diabetes mellitus: Secondary | ICD-10-CM

## 2018-03-03 DIAGNOSIS — Z801 Family history of malignant neoplasm of trachea, bronchus and lung: Secondary | ICD-10-CM

## 2018-03-03 DIAGNOSIS — Z791 Long term (current) use of non-steroidal anti-inflammatories (NSAID): Secondary | ICD-10-CM

## 2018-03-03 DIAGNOSIS — Z7951 Long term (current) use of inhaled steroids: Secondary | ICD-10-CM

## 2018-03-03 DIAGNOSIS — Z951 Presence of aortocoronary bypass graft: Secondary | ICD-10-CM

## 2018-03-03 DIAGNOSIS — M25551 Pain in right hip: Secondary | ICD-10-CM | POA: Diagnosis not present

## 2018-03-03 DIAGNOSIS — I25118 Atherosclerotic heart disease of native coronary artery with other forms of angina pectoris: Secondary | ICD-10-CM | POA: Diagnosis present

## 2018-03-03 DIAGNOSIS — Z825 Family history of asthma and other chronic lower respiratory diseases: Secondary | ICD-10-CM

## 2018-03-03 DIAGNOSIS — Z823 Family history of stroke: Secondary | ICD-10-CM

## 2018-03-03 DIAGNOSIS — J4489 Other specified chronic obstructive pulmonary disease: Secondary | ICD-10-CM | POA: Diagnosis present

## 2018-03-03 DIAGNOSIS — S329XXA Fracture of unspecified parts of lumbosacral spine and pelvis, initial encounter for closed fracture: Secondary | ICD-10-CM | POA: Diagnosis present

## 2018-03-03 DIAGNOSIS — Z961 Presence of intraocular lens: Secondary | ICD-10-CM | POA: Diagnosis present

## 2018-03-03 DIAGNOSIS — Z888 Allergy status to other drugs, medicaments and biological substances status: Secondary | ICD-10-CM

## 2018-03-04 ENCOUNTER — Other Ambulatory Visit: Payer: Self-pay

## 2018-03-04 ENCOUNTER — Encounter (HOSPITAL_COMMUNITY): Payer: Self-pay | Admitting: Emergency Medicine

## 2018-03-04 ENCOUNTER — Emergency Department (HOSPITAL_COMMUNITY): Payer: Medicare Other

## 2018-03-04 ENCOUNTER — Observation Stay (HOSPITAL_BASED_OUTPATIENT_CLINIC_OR_DEPARTMENT_OTHER): Payer: Medicare Other

## 2018-03-04 ENCOUNTER — Other Ambulatory Visit: Payer: Self-pay | Admitting: Internal Medicine

## 2018-03-04 DIAGNOSIS — R55 Syncope and collapse: Secondary | ICD-10-CM

## 2018-03-04 DIAGNOSIS — S0990XA Unspecified injury of head, initial encounter: Secondary | ICD-10-CM | POA: Diagnosis not present

## 2018-03-04 DIAGNOSIS — W19XXXA Unspecified fall, initial encounter: Secondary | ICD-10-CM | POA: Diagnosis not present

## 2018-03-04 DIAGNOSIS — S32391A Other fracture of right ilium, initial encounter for closed fracture: Secondary | ICD-10-CM | POA: Diagnosis not present

## 2018-03-04 DIAGNOSIS — S32311A Displaced avulsion fracture of right ilium, initial encounter for closed fracture: Secondary | ICD-10-CM | POA: Diagnosis not present

## 2018-03-04 DIAGNOSIS — S329XXA Fracture of unspecified parts of lumbosacral spine and pelvis, initial encounter for closed fracture: Secondary | ICD-10-CM | POA: Diagnosis present

## 2018-03-04 DIAGNOSIS — S51011A Laceration without foreign body of right elbow, initial encounter: Secondary | ICD-10-CM | POA: Diagnosis not present

## 2018-03-04 DIAGNOSIS — S299XXA Unspecified injury of thorax, initial encounter: Secondary | ICD-10-CM | POA: Diagnosis not present

## 2018-03-04 LAB — I-STAT TROPONIN, ED: Troponin i, poc: 0.01 ng/mL (ref 0.00–0.08)

## 2018-03-04 LAB — TROPONIN I
Troponin I: <0.03 ng/mL (ref <0.03)
Troponin I: <0.03 ng/mL (ref <0.03)
Troponin I: <0.03 ng/mL (ref <0.03)

## 2018-03-04 LAB — CBC
HCT: 34.5 % — ABNORMAL LOW (ref 39.0–52.0)
Hemoglobin: 11.8 g/dL — ABNORMAL LOW (ref 13.0–17.0)
MCH: 33.9 pg (ref 26.0–34.0)
MCHC: 34.2 g/dL (ref 30.0–36.0)
MCV: 99.1 fL (ref 80.0–100.0)
Platelets: 114 10*3/uL — ABNORMAL LOW (ref 150–400)
RBC: 3.48 MIL/uL — ABNORMAL LOW (ref 4.22–5.81)
RDW: 13.2 % (ref 11.5–15.5)
WBC: 8.2 10*3/uL (ref 4.0–10.5)
nRBC: 0 % (ref 0.0–0.2)

## 2018-03-04 LAB — BASIC METABOLIC PANEL
Anion gap: 10 (ref 5–15)
Anion gap: 8 (ref 5–15)
BUN: 11 mg/dL (ref 8–23)
BUN: 13 mg/dL (ref 8–23)
CO2: 23 mmol/L (ref 22–32)
CO2: 24 mmol/L (ref 22–32)
Calcium: 7.9 mg/dL — ABNORMAL LOW (ref 8.9–10.3)
Calcium: 8.5 mg/dL — ABNORMAL LOW (ref 8.9–10.3)
Chloride: 102 mmol/L (ref 98–111)
Chloride: 99 mmol/L (ref 98–111)
Creatinine, Ser: 0.93 mg/dL (ref 0.61–1.24)
Creatinine, Ser: 0.95 mg/dL (ref 0.61–1.24)
GFR calc Af Amer: >60 mL/min (ref >60)
GFR calc Af Amer: >60 mL/min (ref >60)
GFR calc non Af Amer: >60 mL/min (ref >60)
GFR calc non Af Amer: >60 mL/min (ref >60)
Glucose, Bld: 127 mg/dL — ABNORMAL HIGH (ref 70–99)
Glucose, Bld: 147 mg/dL — ABNORMAL HIGH (ref 70–99)
Potassium: 4.3 mmol/L (ref 3.5–5.1)
Potassium: 5.9 mmol/L — ABNORMAL HIGH (ref 3.5–5.1)
Sodium: 133 mmol/L — ABNORMAL LOW (ref 135–145)
Sodium: 133 mmol/L — ABNORMAL LOW (ref 135–145)

## 2018-03-04 LAB — GLUCOSE, CAPILLARY
Glucose-Capillary: 170 mg/dL — ABNORMAL HIGH (ref 70–99)
Glucose-Capillary: 199 mg/dL — ABNORMAL HIGH (ref 70–99)

## 2018-03-04 LAB — MRSA PCR SCREENING: MRSA by PCR: NEGATIVE

## 2018-03-04 LAB — ECHOCARDIOGRAM COMPLETE
Height: 73 in
Weight: 3200 oz

## 2018-03-04 LAB — BRAIN NATRIURETIC PEPTIDE: B Natriuretic Peptide: 219.8 pg/mL — ABNORMAL HIGH (ref 0.0–100.0)

## 2018-03-04 LAB — MAGNESIUM: Magnesium: 1.8 mg/dL (ref 1.7–2.4)

## 2018-03-04 MED ORDER — SODIUM CHLORIDE 0.9% FLUSH
3.0000 mL | Freq: Two times a day (BID) | INTRAVENOUS | Status: DC
  Administered 2018-03-04 – 2018-03-07 (×7): 3 mL via INTRAVENOUS

## 2018-03-04 MED ORDER — ATORVASTATIN CALCIUM 80 MG PO TABS: 80.0000 mg | ORAL_TABLET | Freq: Every day | ORAL | Status: DC

## 2018-03-04 MED ORDER — INSULIN ASPART 100 UNIT/ML ~~LOC~~ SOLN: 0.0000 [IU] | Freq: Every day | SUBCUTANEOUS | Status: DC

## 2018-03-04 MED ORDER — ATORVASTATIN CALCIUM 80 MG PO TABS
80.0000 mg | ORAL_TABLET | Freq: Every day | ORAL | Status: DC
  Administered 2018-03-04 – 2018-03-06 (×3): 80 mg via ORAL
  Filled 2018-03-04 (×3): qty 1

## 2018-03-04 MED ORDER — SODIUM CHLORIDE 0.9 % IV SOLN: 250.0000 mL | INTRAVENOUS | Status: DC | PRN

## 2018-03-04 MED ORDER — ACETAMINOPHEN 650 MG RE SUPP: 650.0000 mg | Freq: Four times a day (QID) | RECTAL | Status: DC | PRN

## 2018-03-04 MED ORDER — SODIUM CHLORIDE 0.9% FLUSH
3.0000 mL | Freq: Two times a day (BID) | INTRAVENOUS | Status: DC
  Administered 2018-03-04 – 2018-03-07 (×5): 3 mL via INTRAVENOUS

## 2018-03-04 MED ORDER — FUROSEMIDE 20 MG PO TABS
20.0000 mg | ORAL_TABLET | Freq: Every day | ORAL | Status: DC
  Administered 2018-03-04 – 2018-03-05 (×2): 20 mg via ORAL
  Filled 2018-03-04 (×2): qty 1

## 2018-03-04 MED ORDER — DOFETILIDE 500 MCG PO CAPS
500.0000 ug | ORAL_CAPSULE | Freq: Two times a day (BID) | ORAL | Status: DC
  Administered 2018-03-04 – 2018-03-07 (×7): 500 ug via ORAL
  Filled 2018-03-04 (×7): qty 1

## 2018-03-04 MED ORDER — MAGNESIUM SULFATE 2 GM/50ML IV SOLN
2.0000 g | Freq: Once | INTRAVENOUS | Status: AC
  Administered 2018-03-04: 2 g via INTRAVENOUS
  Filled 2018-03-04: qty 50

## 2018-03-04 MED ORDER — SODIUM ZIRCONIUM CYCLOSILICATE 10 G PO PACK
10.0000 g | PACK | Freq: Every day | ORAL | Status: DC
  Administered 2018-03-04 – 2018-03-07 (×4): 10 g via ORAL
  Filled 2018-03-04 (×4): qty 1

## 2018-03-04 MED ORDER — SODIUM CHLORIDE 0.9% FLUSH
3.0000 mL | Freq: Once | INTRAVENOUS | Status: AC
  Administered 2018-03-04: 3 mL via INTRAVENOUS

## 2018-03-04 MED ORDER — APIXABAN 5 MG PO TABS
5.0000 mg | ORAL_TABLET | Freq: Two times a day (BID) | ORAL | Status: DC
  Administered 2018-03-04 – 2018-03-07 (×7): 5 mg via ORAL
  Filled 2018-03-04 (×7): qty 1

## 2018-03-04 MED ORDER — INSULIN ASPART 100 UNIT/ML ~~LOC~~ SOLN
0.0000 [IU] | Freq: Three times a day (TID) | SUBCUTANEOUS | Status: DC
  Administered 2018-03-04: 2 [IU] via SUBCUTANEOUS
  Administered 2018-03-05 (×2): 1 [IU] via SUBCUTANEOUS
  Administered 2018-03-05 – 2018-03-06 (×2): 2 [IU] via SUBCUTANEOUS
  Administered 2018-03-06: 3 [IU] via SUBCUTANEOUS
  Administered 2018-03-06: 2 [IU] via SUBCUTANEOUS
  Administered 2018-03-07: 3 [IU] via SUBCUTANEOUS
  Administered 2018-03-07: 2 [IU] via SUBCUTANEOUS

## 2018-03-04 MED ORDER — UMECLIDINIUM-VILANTEROL 62.5-25 MCG/INH IN AEPB
1.0000 | INHALATION_SPRAY | Freq: Every day | RESPIRATORY_TRACT | Status: DC
  Administered 2018-03-04 – 2018-03-07 (×4): 1 via RESPIRATORY_TRACT
  Filled 2018-03-04 (×2): qty 14

## 2018-03-04 MED ORDER — HYDROCODONE-ACETAMINOPHEN 5-325 MG PO TABS
1.0000 | ORAL_TABLET | ORAL | Status: DC | PRN
  Administered 2018-03-04 – 2018-03-07 (×10): 2 via ORAL
  Filled 2018-03-04 (×10): qty 2

## 2018-03-04 MED ORDER — SODIUM CHLORIDE 0.9% FLUSH: 3.0000 mL | INTRAVENOUS | Status: DC | PRN

## 2018-03-04 MED ORDER — ACETAMINOPHEN 325 MG PO TABS: 650.0000 mg | ORAL_TABLET | Freq: Four times a day (QID) | ORAL | Status: DC | PRN

## 2018-03-04 MED ORDER — LOSARTAN POTASSIUM 25 MG PO TABS
25.0000 mg | ORAL_TABLET | Freq: Every day | ORAL | Status: DC
  Administered 2018-03-04 – 2018-03-05 (×2): 25 mg via ORAL
  Filled 2018-03-04 (×2): qty 1

## 2018-03-04 NOTE — ED Notes (Signed)
Breakfast Tray ordered  

## 2018-03-04 NOTE — H&P (Signed)
History and Physical    [name] NWG:[id number] DOB: [birth year] DOA: 03/03/2018  PCP: Joycelyn Rua, MD  Patient coming from: Home  Chief Complaint: Passed out  HPI: [name] is a 85 y.o. male with medical history significant of orthostatic hypotension, coronary artery disease status post AICD/pacemaker, history of ventricular tachycardia comes in with a sudden syncopal episode while watching the Super Bowl.  Patient was watching Super Bowl tonight he got up to go to the kitchen to get him another couple coffee when he suddenly passed out.  Patient had no prodrome.  He does not really remember anything happening before next thing he knew he woke up on the floor coffee was splattered all over.  He had a difficult time getting up and his wife was sleeping in bed.  He did not want a call 911 because the door was locked he was scared they would break down the door.  So he called his neighbor who came to his assistance.  Patient is having difficulty bearing weight to his right hip.  Other than that he has no injuries.  He reports that the syncopal episode literally was less than a minute and he woke up pretty immediately normal except for the pelvis hip pain.  Patient is being referred for admission for syncope and pelvic fracture.  Review of Systems: As per HPI otherwise 10 point review of systems negative.   Past Medical History:  Diagnosis Date  . AICD (automatic cardioverter/defibrillator) present    [employer] PPM/ICD  . Coronary artery disease    [facility]  . Diabetes mellitus   . Dysrhythmia   . Endocarditis, valve unspecified    Mitral  . Headache    hx migraines 6-7 x mo  . History of migraine headaches   . History of seasonal allergies   . ICD (implantable cardiac defibrillator) in place   . PAF (paroxysmal atrial fibrillation) (HCC)    NO LAA occlusion at the time of surgery  [name] 2018  . Pleural effusion   . PVC's (premature  ventricular contractions)   . Ventricular tachycardia, polymorphic (HCC)    status post ICD implantation    Past Surgical History:  Procedure Laterality Date  . CARDIAC CATHETERIZATION  04/03/2007   EF 40%  . CARDIAC CATHETERIZATION  02/06/2006   EF 45%. ANTERIOR HYPOKINESIS  . CARDIAC CATHETERIZATION  05/29/2000   EF 50%. MILD ANTERIOR HYPOKINESIS  . CARDIAC CATHETERIZATION  10/25/1999   EF 50%. SEVERE MITRAL REGURGITATION  . CARDIAC CATHETERIZATION  01/06/2003   EF 50%  . CARDIAC DEFIBRILLATOR PLACEMENT    . CATARACT EXTRACTION W/ INTRAOCULAR LENS  IMPLANT, BILATERAL    . CORONARY ARTERY BYPASS GRAFT  2001   2 VESSEL CABG AND MITRAL VALVE REPAIR. HE HAD A LIMA GRAFT TO THE LAD AND RADIAL ARTERY  GRAFT TO THE OBTUSE MARGINAL  OF LEFT CIRCUMFLEX  . EP IMPLANTABLE DEVICE N/A 11/06/2014   Procedure: ICD Generator Changeout;  Surgeon: Duke Salvia, MD;  Location: [facility] INVASIVE CV LAB;  Service: Cardiovascular;  Laterality: N/A;  . FOREIGN BODY REMOVAL Right 06/21/2017   Procedure: FOREIGN BODY REMOVAL ADULT RIGHT RING FINGER;  Surgeon: Betha Loa, MD;  Location: [facility] OR;  Service: Orthopedics;  Laterality: Right;  . HEMORRHOID SURGERY    . HEMORROIDECTOMY    . INTRAVASCULAR PRESSURE WIRE/FFR STUDY N/A 01/05/2017   Procedure: INTRAVASCULAR PRESSURE WIRE/FFR STUDY;  Surgeon: [name], MD;  Location: [facility] INVASIVE CV LAB;  Service: Cardiovascular;  Laterality: N/A;  . KNEE ARTHROSCOPY     RIGHT KNEE  . LEFT HEART CATHETERIZATION WITH CORONARY ANGIOGRAM N/A 07/04/2012   Procedure: LEFT HEART CATHETERIZATION WITH CORONARY ANGIOGRAM;  Surgeon: [name], MD;  Location: [facility] CATH LAB;  Service: Cardiovascular;  Laterality: N/A;  . MITRAL VALVE REPLACEMENT     No LAA occlusion at the time of surgery  Tamela Oddi report 2018  . RIGHT/LEFT HEART CATH AND CORONARY/GRAFT ANGIOGRAPHY N/A 01/05/2017   Procedure: RIGHT/LEFT HEART CATH AND CORONARY/GRAFT ANGIOGRAPHY;  Surgeon: [name], MD;   Location: [facility] INVASIVE CV LAB;  Service: Cardiovascular;  Laterality: N/A;  . TRANSTHORACIC ECHOCARDIOGRAM  04/02/2007   EF 35%     reports that he quit smoking about 54 years ago. His smoking use included cigarettes. He started smoking about 59 years ago. He has a 5.00 pack-year smoking history. He has never used smokeless tobacco. He reports current alcohol use. He reports that he does not use drugs.  Allergies  Allergen Reactions  . Latex Other (See Comments)    REDNESS AT REACTION SITE.  Marland Kitchen Metformin Diarrhea  . Rivaroxaban Other (See Comments)    Headaches, blurred vision  . Other Other (See Comments)    BEE STINGS   . Sotalol Other (See Comments)    "BLUE TOES"- cut his circulation off  . Warfarin Sodium Other (See Comments)    REACTION: migraine headaches/vision impariment    Family History  Problem Relation Age of Onset  . Heart disease Father   . Emphysema Father   . Stroke Mother   . Cancer Sister        breast cancer  . Heart disease Paternal Grandmother   . Heart disease Paternal Grandfather   . Diabetes Other        grandmother, aunt, uncle  . Cancer Other        lung cancer, aunt    Prior to Admission medications   Medication Sig Start Date End Date Taking? Authorizing Provider  atorvastatin (LIPITOR) 80 MG tablet Take 80 mg by mouth at bedtime.  07/12/10  Yes [provider]  Cholecalciferol (VITAMIN D-3) 5000 UNITS TABS Take 5,000-10,000 Units by mouth See admin instructions. Take 5000 units by mouth on Sunday, Tuesday, Thursday, and Saturday. Take 13244 units by mouth on Monday, Wednesday, and Friday   Yes [provider]  dofetilide ([name]) 500 MCG capsule Take 1 capsule (500 mcg total) by mouth 2 (two) times daily. 02/26/18  Yes Duke Salvia, MD  ELIQUIS 5 MG TABS tablet TAKE ONE TABLET BY MOUTH TWICE DAILY Patient taking differently: Take 5 mg by mouth 2 (two) times daily.  02/18/18  Yes Duke Salvia, MD  EPIPEN 2-PAK 0.3 MG/0.3ML  SOAJ injection Inject 0.3 mg into the muscle daily as needed for anaphylaxis. USE ONLY IN EMERGENCY 08/12/12  Yes [provider]  fluticasone (FLONASE) 50 MCG/ACT nasal spray Place 1 spray into the nose daily as needed for rhinitis or allergies.   Yes [provider]  furosemide (LASIX) 20 MG tablet Take 1 tablet (20 mg total) by mouth daily. Patient taking differently: Take 20 mg by mouth daily as needed for fluid.  12/18/17  Yes Duke Salvia, MD  ibuprofen (ADVIL,MOTRIN) 200 MG tablet Take 400 mg by mouth every 8 (eight) hours as needed for headache or moderate pain.    Yes [provider]  Magnesium 250 MG TABS Take 250 mg by mouth daily.    Yes  [provider]  metoprolol succinate (TOPROL-XL) 50 MG 24 hr tablet TAKE ONE TABLET BY MOUTH DAILY IMMEDIATELY FOLLOWING A MEAL Patient taking differently: Take 50 mg by mouth daily.  03/12/17  Yes Duke Salvia, MD  Multiple Vitamin (MULTIVITAMIN WITH MINERALS) TABS Take 1 tablet by mouth daily. Centrum Silver   Yes [provider]  Multiple Vitamins-Minerals (PRESERVISION AREDS 2) CAPS Take 1 capsule by mouth 2 (two) times daily.    Yes [provider]  nateglinide (STARLIX) 120 MG tablet Take 120 mg by mouth Twice daily.  07/12/10  Yes [provider]  pioglitazone (ACTOS) 45 MG tablet Take 45 mg by mouth daily.     Yes [provider]  Propylhexedrine Arlys John) INHA Place 1 each into the nose daily as needed (for congestion).   Yes [provider]  SF 5000 PLUS 1.1 % CREA dental cream Place 1 application onto teeth at bedtime.  06/05/17  Yes [provider]  sitaGLIPtin (JANUVIA) 100 MG tablet Take 100 mg by mouth at bedtime.    Yes [provider]  umeclidinium-vilanterol (ANORO ELLIPTA) 62.5-25 MCG/INH AEPB Inhale 1 puff into the lungs daily. 01/15/18  Yes Coralyn Helling, MD  losartan (COZAAR) 25 MG tablet Take 1 tablet (25 mg total) by mouth  daily. Patient not taking: Reported on 03/04/2018 02/20/18 05/21/18  Duke Salvia, MD  Tiotropium Bromide-Olodaterol (STIOLTO RESPIMAT) 2.5-2.5 MCG/ACT AERS Inhale 2 puffs into the lungs daily. Patient not taking: Reported on 03/04/2018 02/22/18   Coral Ceo, NP    Physical Exam: Vitals:   03/04/18 0045 03/04/18 0100 03/04/18 0115 03/04/18 0130  BP: 116/66 116/68 117/68 117/75  Pulse: 66  65 66  Resp: 18  (!) 21 20  Temp:      TempSrc:      SpO2: 100%  96% 98%  Weight:      Height:          Constitutional: NAD, calm, comfortable Vitals:   03/04/18 0045 03/04/18 0100 03/04/18 0115 03/04/18 0130  BP: 116/66 116/68 117/68 117/75  Pulse: 66  65 66  Resp: 18  (!) 21 20  Temp:      TempSrc:      SpO2: 100%  96% 98%  Weight:      Height:       Eyes: PERRL, lids and conjunctivae normal ENMT: Mucous membranes are moist. Posterior pharynx clear of any exudate or lesions.Normal dentition.  Neck: normal, supple, no masses, no thyromegaly Respiratory: clear to auscultation bilaterally, no wheezing, no crackles. Normal respiratory effort. No accessory muscle use.  Cardiovascular: Regular rate and rhythm, no murmurs / rubs / gallops. No extremity edema. 2+ pedal pulses. No carotid bruits.  Abdomen: no tenderness, no masses palpated. No hepatosplenomegaly. Bowel sounds positive.  Musculoskeletal: no clubbing / cyanosis. No joint deformity upper and lower extremities. Good ROM, no contractures. Normal muscle tone.  Patient has pain with internal and external rotation of the right hip and with movement of the right hip Skin: no rashes, lesions, ulcers. No induration Neurologic: CN 2-12 grossly intact. Sensation intact, DTR normal. Strength 5/5 in all 4.  Psychiatric: Normal judgment and insight. Alert and oriented x 3. Normal mood.    Labs on Admission: I have personally reviewed following labs and imaging studies  CBC: Recent Labs  Lab 03/04/18 0030  WBC 8.2  HGB 11.8*  HCT 34.5*   MCV 99.1  PLT 114*   Basic Metabolic Panel: Recent Labs  Lab 03/04/18  0030  NA 133*  K 4.3  CL 99  CO2 24  GLUCOSE 127*  BUN 11  CREATININE 0.93  CALCIUM 8.5*   GFR: Estimated Creatinine Clearance: 66.8 mL/min (by C-G formula based on SCr of 0.93 mg/dL). Liver Function Tests: No results for input(s): AST, ALT, ALKPHOS, BILITOT, PROT, ALBUMIN in the last 168 hours. No results for input(s): LIPASE, AMYLASE in the last 168 hours. No results for input(s): AMMONIA in the last 168 hours. Coagulation Profile: No results for input(s): INR, PROTIME in the last 168 hours. Cardiac Enzymes: No results for input(s): CKTOTAL, CKMB, CKMBINDEX, TROPONINI in the last 168 hours. BNP (last 3 results) Recent Labs    12/18/17 1506  PROBNP 1,465*   HbA1C: No results for input(s): HGBA1C in the last 72 hours. CBG: No results for input(s): GLUCAP in the last 168 hours. Lipid Profile: No results for input(s): CHOL, HDL, LDLCALC, TRIG, CHOLHDL, LDLDIRECT in the last 72 hours. Thyroid Function Tests: No results for input(s): TSH, T4TOTAL, FREET4, T3FREE, THYROIDAB in the last 72 hours. Anemia Panel: No results for input(s): VITAMINB12, FOLATE, FERRITIN, TIBC, IRON, RETICCTPCT in the last 72 hours. Urine analysis: No results found for: COLORURINE, APPEARANCEUR, LABSPEC, PHURINE, GLUCOSEU, HGBUR, BILIRUBINUR, KETONESUR, PROTEINUR, UROBILINOGEN, NITRITE, LEUKOCYTESUR Sepsis Labs: !!!!!!!!!!!!!!!!!!!!!!!!!!!!!!!!!!!!!!!!!!!! @LABRCNTIP (procalcitonin:4,lacticidven:4) )No results found for this or any previous visit (from the past 240 hour(s)).   Radiological Exams on Admission: Dg Chest 2 View  Result Date: 03/04/2018 CLINICAL DATA:  Patient fell tonight. EXAM: CHEST - 2 VIEW COMPARISON:  12/18/2017 FINDINGS: Postoperative changes in the mediastinum. Cardiac pacemaker. Cardiac enlargement. No vascular congestion, edema, or consolidation. No blunting of costophrenic angles. No pneumothorax.  Mediastinal contours appear intact. IMPRESSION: Cardiac enlargement. No evidence of active pulmonary disease. Electronically Signed   By: Burman Nieves M.D.   On: 03/04/2018 01:04   Dg Elbow Complete Right  Result Date: 03/04/2018 CLINICAL DATA:  Pain and laceration after a fall. EXAM: RIGHT ELBOW - COMPLETE 3+ VIEW COMPARISON:  None. FINDINGS: Right elbow appears intact. No evidence of acute fracture or subluxation. No focal bone lesion or bone destruction. Bone cortex and trabecular architecture appear intact. No significant effusion. No radiopaque soft tissue foreign bodies. Surgical clips in the soft tissues. IMPRESSION: 1. No acute bony abnormalities. 2. No radiopaque foreign body. Electronically Signed   By: Burman Nieves M.D.   On: 03/04/2018 01:05   Ct Head Wo Contrast  Result Date: 03/04/2018 CLINICAL DATA:  Head trauma, patient on anticoagulation EXAM: CT HEAD WITHOUT CONTRAST TECHNIQUE: Contiguous axial images were obtained from the base of the skull through the vertex without intravenous contrast. COMPARISON:  12/31/2017 FINDINGS: Brain: No evidence of acute infarction, hemorrhage, extra-axial collection, ventriculomegaly, or mass effect. Generalized cerebral atrophy. Periventricular white matter low attenuation likely secondary to microangiopathy. Vascular: Cerebrovascular atherosclerotic calcifications are noted. Skull: Negative for fracture or focal lesion. Sinuses/Orbits: Visualized portions of the orbits are unremarkable. Visualized portions of the paranasal sinuses are unremarkable. Visualized portions of the mastoid air cells are unremarkable. Other: None. IMPRESSION: No acute intracranial pathology. Electronically Signed   By: Elige Ko   On: 03/04/2018 01:14   Dg Hip Unilat  With Pelvis 2-3 Views Right  Result Date: 03/04/2018 CLINICAL DATA:  Right hip pain after a fall. EXAM: DG HIP (WITH OR WITHOUT PELVIS) 2-3V RIGHT COMPARISON:  None. FINDINGS: Comminuted and displaced acute  fractures of the right anterior iliac bone. Right hip appears intact. Degenerative changes in the lower lumbar spine and both hips. SI  joints and symphysis pubis are not displaced. Vascular calcifications. Soft tissue swelling over the right hip, likely contusion. IMPRESSION: Acute posttraumatic comminuted and displaced fractures of the anterior superior right iliac bone. Electronically Signed   By: Burman Nieves M.D.   On: 03/04/2018 01:06    EKG: Independently reviewed.  Intra-ventricular conduction delay same as old no pacer marks seen  Old chart reviewed  Case discussed with Dr. Senaida Ores in the ED  Chest x-ray reviewed no edema or infiltrate  Assessment/Plan 85 year old male with syncopal episode resulting in a pelvic fracture Principal Problem:   Syncope-orthostatic vital signs if he tolerates this.  Serial troponin.  Patient has no neurological symptoms and due to the quick nature and the return to normalcy quickly after the syncopal episode I doubt it is any neurological issue.  His pacemaker interrogation did not show any significant arrhythmias prior to the syncopal episode tonight.  Will need to follow-up on that final report.  Observe overnight on telemetry.  Patient has a history of orthostatic hypotension in the past.  Place TED hose.  Active Problems:   Pelvic fracture (HCC)-orthopedic surgery consulted.  Advising weightbearing as tolerates.  Patient may need rehab.  Start him on oral hydrocodone.    ATRIAL FIBRILLATION-currently rate controlled    Automatic implantable cardioverter-defibrillator in situ-noted    Coronary artery disease with exertional angina (HCC)-stable    Ventricular tachycardia, polymorphic (HCC)-noted has AICD    Asthma-COPD overlap syndrome (HCC)-stable at this time     DVT prophylaxis: SCDs Eliquis Code Status: Full Family Communication next her neighbor Disposition Plan: 1 to 2 days Consults called: None Admission status:  Observation   Amyjo Mizrachi A MD [facility]  If 7PM-7AM, please contact night-coverage [email or web address] Password [email or web address]  03/04/2018, 2:31 AM

## 2018-03-04 NOTE — ED Provider Notes (Signed)
[facility] EMERGENCY DEPARTMENT Provider Note   CSN: [id number] Arrival date & time: 03/03/18  2357     History   Chief Complaint Chief Complaint  Patient presents with  . Loss of Consciousness  . Fall  . Hip Pain    HPI [name] is a 85 y.o. male.  Patient brought to the emergency department for evaluation after syncopal episode.  Patient reports that he was sitting in a recliner chair for some time watching the Super Bowl.  He stood up to get a couple coffee and passed out.  He did not have any symptoms prior to this, denies chest pain, heart palpitations.  He does have a pacer defibrillator, did not feel any shock.  Patient reports that he woke up on the floor, was unable to get up because he noted moderate to severe pain in his right hip.  He alerted his wife and EMS has brought him to the ER for further evaluation.     Past Medical History:  Diagnosis Date  . AICD (automatic cardioverter/defibrillator) present    [employer]n PPM/ICD  . Coronary artery disease    [facility]  . Diabetes mellitus   . Dysrhythmia   . Endocarditis, valve unspecified    Mitral  . Headache    hx migraines 6-7 x mo  . History of migraine headaches   . History of seasonal allergies   . ICD (implantable cardiac defibrillator) in place   . PAF (paroxysmal atrial fibrillation) ([place])    NO LAA occlusion at the time of surgery  [name] 2018  . Pleural effusion   . PVC's (premature ventricular contractions)   . Ventricular tachycardia, polymorphic ([place])    status post ICD implantation    Patient Active Problem List   Diagnosis Date Noted  . Syncope 03/04/2018  . Pelvic fracture ([place]) 03/04/2018  . Asthma-COPD overlap syndrome ([place]) 02/22/2018  . Cough 02/22/2018  . Orthostatic presyncope 08/01/2010  . Coronary artery disease with exertional angina ([place]) 08/01/2010  . Ventricular tachycardia, polymorphic ([place])   . ATRIAL FIBRILLATION  06/15/2008  . PREMATURE VENTRICULAR CONTRACTIONS 04/23/2008  . RAYNAUD'S DISEASE 04/23/2008  . Automatic implantable cardioverter-defibrillator in situ 04/23/2008    Past Surgical History:  Procedure Laterality Date  . CARDIAC CATHETERIZATION  04/03/2007   EF 40%  . CARDIAC CATHETERIZATION  02/06/2006   EF 45%. ANTERIOR HYPOKINESIS  . CARDIAC CATHETERIZATION  05/29/2000   EF 50%. MILD ANTERIOR HYPOKINESIS  . CARDIAC CATHETERIZATION  10/25/1999   EF 50%. SEVERE MITRAL REGURGITATION  . CARDIAC CATHETERIZATION  01/06/2003   EF 50%  . CARDIAC DEFIBRILLATOR PLACEMENT    . CATARACT EXTRACTION W/ INTRAOCULAR LENS  IMPLANT, BILATERAL    . CORONARY ARTERY BYPASS GRAFT  2001   2 VESSEL CABG AND MITRAL VALVE REPAIR. HE HAD A LIMA GRAFT TO THE LAD AND RADIAL ARTERY  GRAFT TO THE OBTUSE MARGINAL  OF LEFT CIRCUMFLEX  . EP IMPLANTABLE DEVICE N/A 11/06/2014   Procedure: ICD Generator Changeout;  Surgeon: Deboraha Sprang, MD;  Location: [facility];  Service: Cardiovascular;  Laterality: N/A;  . FOREIGN BODY REMOVAL Right 06/21/2017   Procedure: FOREIGN BODY REMOVAL ADULT RIGHT RING FINGER;  Surgeon: Leanora Cover, MD;  Location: [facility];  Service: Orthopedics;  Laterality: Right;  . HEMORRHOID SURGERY    . HEMORROIDECTOMY    . INTRAVASCULAR PRESSURE WIRE/FFR STUDY N/A 01/05/2017   Procedure: INTRAVASCULAR PRESSURE WIRE/FFR STUDY;  Surgeon: Sherren Mocha,  MD;  Location: [facility];  Service: Cardiovascular;  Laterality: N/A;  . KNEE ARTHROSCOPY     RIGHT KNEE  . LEFT HEART CATHETERIZATION WITH CORONARY ANGIOGRAM N/A 07/04/2012   Procedure: LEFT HEART CATHETERIZATION WITH CORONARY ANGIOGRAM;  Surgeon: Sherren Mocha, MD;  Location: [facility] CATH LAB;  Service: Cardiovascular;  Laterality: N/A;  . MITRAL VALVE REPLACEMENT     No LAA occlusion at the time of surgery  Karie Soda report 2018  . RIGHT/LEFT HEART CATH AND CORONARY/GRAFT ANGIOGRAPHY N/A 01/05/2017   Procedure: RIGHT/LEFT HEART CATH AND  CORONARY/GRAFT ANGIOGRAPHY;  Surgeon: Sherren Mocha, MD;  Location: [facility];  Service: Cardiovascular;  Laterality: N/A;  . TRANSTHORACIC ECHOCARDIOGRAM  04/02/2007   EF 35%        Home Medications    Prior to Admission medications   Medication Sig Start Date End Date Taking? Authorizing Provider  atorvastatin (LIPITOR) 80 MG tablet Take 80 mg by mouth at bedtime.  07/12/10  Yes [provider]  Cholecalciferol (VITAMIN D-3) 5000 UNITS TABS Take 5,000-10,000 Units by mouth See admin instructions. Take 5000 units by mouth on Sunday, Tuesday, Thursday, and Saturday. Take 10000 units by mouth on Monday, Wednesday, and Friday   Yes [provider]  dofetilide ([name]) 500 MCG capsule Take 1 capsule (500 mcg total) by mouth 2 (two) times daily. 02/26/18  Yes Deboraha Sprang, MD  ELIQUIS 5 MG TABS tablet TAKE ONE TABLET BY MOUTH TWICE DAILY Patient taking differently: Take 5 mg by mouth 2 (two) times daily.  02/18/18  Yes Deboraha Sprang, MD  EPIPEN 2-PAK 0.3 MG/0.3ML SOAJ injection Inject 0.3 mg into the muscle daily as needed for anaphylaxis. USE ONLY IN EMERGENCY 08/12/12  Yes [provider]  fluticasone (FLONASE) 50 MCG/ACT nasal spray Place 1 spray into the nose daily as needed for rhinitis or allergies.   Yes [provider]  furosemide (LASIX) 20 MG tablet Take 1 tablet (20 mg total) by mouth daily. Patient taking differently: Take 20 mg by mouth daily as needed for fluid.  12/18/17  Yes Deboraha Sprang, MD  ibuprofen (ADVIL,MOTRIN) 200 MG tablet Take 400 mg by mouth every 8 (eight) hours as needed for headache or moderate pain.    Yes [provider]  Magnesium 250 MG TABS Take 250 mg by mouth daily.    Yes [provider]  metoprolol succinate (TOPROL-XL) 50 MG 24 hr tablet TAKE ONE TABLET BY MOUTH DAILY IMMEDIATELY FOLLOWING A MEAL Patient taking differently: Take 50 mg by mouth daily.  03/12/17  Yes Deboraha Sprang, MD    Multiple Vitamin (MULTIVITAMIN WITH MINERALS) TABS Take 1 tablet by mouth daily. Centrum Silver   Yes [provider]  Multiple Vitamins-Minerals (PRESERVISION AREDS 2) CAPS Take 1 capsule by mouth 2 (two) times daily.    Yes [provider]  nateglinide (STARLIX) 120 MG tablet Take 120 mg by mouth Twice daily.  07/12/10  Yes [provider]  pioglitazone (ACTOS) 45 MG tablet Take 45 mg by mouth daily.     Yes [provider]  Propylhexedrine Darcella Gasman) INHA Place 1 each into the nose daily as needed (for congestion).   Yes [provider]  SF 5000 PLUS 1.1 % CREA dental cream Place 1 application onto teeth at bedtime.  06/05/17  Yes [provider]  sitaGLIPtin (JANUVIA) 100 MG tablet Take 100 mg by mouth at bedtime.    Yes [provider]  umeclidinium-vilanterol [facility]  ELLIPTA) 62.5-25 MCG/INH AEPB Inhale 1 puff into the lungs daily. 01/15/18  Yes Chesley Mires, MD  losartan (COZAAR) 25 MG tablet Take 1 tablet (25 mg total) by mouth daily. Patient not taking: Reported on 03/04/2018 02/20/18 05/21/18  Deboraha Sprang, MD  Tiotropium Bromide-Olodaterol (STIOLTO RESPIMAT) 2.5-2.5 MCG/ACT AERS Inhale 2 puffs into the lungs daily. Patient not taking: Reported on 03/04/2018 02/22/18   Lauraine Rinne, NP    Family History Family History  Problem Relation Age of Onset  . Heart disease Father   . Emphysema Father   . Stroke Mother   . Cancer Sister        breast cancer  . Heart disease Paternal Grandmother   . Heart disease Paternal Grandfather   . Diabetes Other        grandmother, aunt, uncle  . Cancer Other        lung cancer, aunt    Social History Social History   Tobacco Use  . Smoking status: Former Smoker    Packs/day: 1.00    Years: 5.00    Pack years: 5.00    Types: Cigarettes    Start date: 02/23/1959    Last attempt to quit: 01/31/1964    Years since quitting: 54.1  . Smokeless tobacco: Never Used  Substance Use  Topics  . Alcohol use: Yes    Alcohol/week: 0.0 standard drinks    Comment: 1-2 a week  . Drug use: No     Allergies   Latex; Metformin; Rivaroxaban; Other; Sotalol; and Warfarin sodium   Review of Systems Review of Systems  Respiratory: Negative for shortness of breath.   Cardiovascular: Negative for chest pain.  Musculoskeletal: Positive for arthralgias. Negative for back pain and neck pain.  Neurological: Positive for syncope.  All other systems reviewed and are negative.    Physical Exam Updated Vital Signs BP 117/75   Pulse 66   Temp 98.7 F (37.1 C) (Oral)   Resp 20   Ht 6\' 1"  (1.854 m)   Wt 90.7 kg   SpO2 98%   BMI 26.39 kg/m   Physical Exam Vitals signs and nursing note reviewed.  Constitutional:      General: He is not in acute distress.    Appearance: Normal appearance. He is well-developed.  HENT:     Head: Normocephalic and atraumatic.     Right Ear: Hearing normal.     Left Ear: Hearing normal.     Nose: Nose normal.  Eyes:     Conjunctiva/sclera: Conjunctivae normal.     Pupils: Pupils are equal, round, and reactive to light.  Neck:     Musculoskeletal: Normal range of motion and neck supple.  Cardiovascular:     Rate and Rhythm: Regular rhythm.     Heart sounds: S1 normal and S2 normal. No murmur. No friction rub. No gallop.   Pulmonary:     Effort: Pulmonary effort is normal. No respiratory distress.     Breath sounds: Normal breath sounds.  Chest:     Chest wall: No tenderness.  Abdominal:     General: Bowel sounds are normal.     Palpations: Abdomen is soft.     Tenderness: There is no abdominal tenderness. There is no guarding or rebound. Negative signs include Murphy's sign and McBurney's sign.     Hernia: No hernia is present.  Musculoskeletal:     Right elbow: He exhibits normal range of motion. Tenderness found.     Right hip: He  exhibits decreased range of motion and tenderness.  Skin:    General: Skin is warm and dry.      Findings: No rash.  Neurological:     Mental Status: He is alert and oriented to person, place, and time.     GCS: GCS eye subscore is 4. GCS verbal subscore is 5. GCS motor subscore is 6.     Cranial Nerves: No cranial nerve deficit.     Sensory: No sensory deficit.     Coordination: Coordination normal.  Psychiatric:        Speech: Speech normal.        Behavior: Behavior normal.        Thought Content: Thought content normal.      ED Treatments / Results  Labs (all labs ordered are listed, but only abnormal results are displayed) Labs Reviewed  BASIC METABOLIC PANEL - Abnormal; Notable for the following components:      Result Value   Sodium 133 (*)    Glucose, Bld 127 (*)    Calcium 8.5 (*)    All other components within normal limits  CBC - Abnormal; Notable for the following components:   RBC 3.48 (*)    Hemoglobin 11.8 (*)    HCT 34.5 (*)    Platelets 114 (*)    All other components within normal limits  BRAIN NATRIURETIC PEPTIDE - Abnormal; Notable for the following components:   B Natriuretic Peptide 219.8 (*)    All other components within normal limits  TROPONIN I  TROPONIN I  TROPONIN I  BASIC METABOLIC PANEL  CBC  MAGNESIUM  I-STAT TROPONIN, ED    EKG EKG Interpretation  Date/Time:  Monday March 04 2018 00:03:50 EST Ventricular Rate:  63 PR Interval:    QRS Duration: 187 QT Interval:  503 QTC Calculation: 515 R Axis:   -84 Text Interpretation:  Sinus rhythm Nonspecific IVCD with LAD LVH with secondary repolarization abnormality Anterior infarct, old similar to previous ventricular paced rhythm but no pacer spikes seen today Confirmed by Orpah Greek [phone]) on 03/04/2018 12:57:25 AM   Radiology Dg Chest 2 View  Result Date: 03/04/2018 CLINICAL DATA:  Patient fell tonight. EXAM: CHEST - 2 VIEW COMPARISON:  12/18/2017 FINDINGS: Postoperative changes in the mediastinum. Cardiac pacemaker. Cardiac enlargement. No vascular congestion,  edema, or consolidation. No blunting of costophrenic angles. No pneumothorax. Mediastinal contours appear intact. IMPRESSION: Cardiac enlargement. No evidence of active pulmonary disease. Electronically Signed   By: Lucienne Capers M.D.   On: 03/04/2018 01:04   Dg Elbow Complete Right  Result Date: 03/04/2018 CLINICAL DATA:  Pain and laceration after a fall. EXAM: RIGHT ELBOW - COMPLETE 3+ VIEW COMPARISON:  None. FINDINGS: Right elbow appears intact. No evidence of acute fracture or subluxation. No focal bone lesion or bone destruction. Bone cortex and trabecular architecture appear intact. No significant effusion. No radiopaque soft tissue foreign bodies. Surgical clips in the soft tissues. IMPRESSION: 1. No acute bony abnormalities. 2. No radiopaque foreign body. Electronically Signed   By: Lucienne Capers M.D.   On: 03/04/2018 01:05   Ct Head Wo Contrast  Result Date: 03/04/2018 CLINICAL DATA:  Head trauma, patient on anticoagulation EXAM: CT HEAD WITHOUT CONTRAST TECHNIQUE: Contiguous axial images were obtained from the base of the skull through the vertex without intravenous contrast. COMPARISON:  12/31/2017 FINDINGS: Brain: No evidence of acute infarction, hemorrhage, extra-axial collection, ventriculomegaly, or mass effect. Generalized cerebral atrophy. Periventricular white matter low attenuation likely secondary to microangiopathy.  Vascular: Cerebrovascular atherosclerotic calcifications are noted. Skull: Negative for fracture or focal lesion. Sinuses/Orbits: Visualized portions of the orbits are unremarkable. Visualized portions of the paranasal sinuses are unremarkable. Visualized portions of the mastoid air cells are unremarkable. Other: None. IMPRESSION: No acute intracranial pathology. Electronically Signed   By: Kathreen Devoid   On: 03/04/2018 01:14   Dg Hip Unilat  With Pelvis 2-3 Views Right  Result Date: 03/04/2018 CLINICAL DATA:  Right hip pain after a fall. EXAM: DG HIP (WITH OR WITHOUT  PELVIS) 2-3V RIGHT COMPARISON:  None. FINDINGS: Comminuted and displaced acute fractures of the right anterior iliac bone. Right hip appears intact. Degenerative changes in the lower lumbar spine and both hips. SI joints and symphysis pubis are not displaced. Vascular calcifications. Soft tissue swelling over the right hip, likely contusion. IMPRESSION: Acute posttraumatic comminuted and displaced fractures of the anterior superior right iliac bone. Electronically Signed   By: Lucienne Capers M.D.   On: 03/04/2018 01:06    Procedures Procedures (including critical care time)  Medications Ordered in ED Medications  atorvastatin (LIPITOR) tablet 80 mg (has no administration in time range)  dofetilide ([name]) capsule 500 mcg (has no administration in time range)  apixaban (ELIQUIS) tablet 5 mg (has no administration in time range)  sodium chloride flush (NS) 0.9 % injection 3 mL (has no administration in time range)  sodium chloride flush (NS) 0.9 % injection 3 mL (has no administration in time range)  sodium chloride flush (NS) 0.9 % injection 3 mL (has no administration in time range)  0.9 %  sodium chloride infusion (has no administration in time range)  HYDROcodone-acetaminophen (NORCO/VICODIN) 5-325 MG per tablet 1-2 tablet (has no administration in time range)  acetaminophen (TYLENOL) tablet 650 mg (has no administration in time range)    Or  acetaminophen (TYLENOL) suppository 650 mg (has no administration in time range)  sodium chloride flush (NS) 0.9 % injection 3 mL (3 mLs Intravenous Given 03/04/18 0024)     Initial Impression / Assessment and Plan / ED Course  I have reviewed the triage vital signs and the nursing notes.  Pertinent labs & imaging results that were available during my care of the patient were reviewed by me and considered in my medical decision making (see chart for details).     Patient presents with complaints of right hip pain after a syncopal episode.  He  is unsure what the circumstances were that caused him to pass out.  He reports that he had been sitting in a chair for a while, stood up and then passed out.  He did not have any precursor symptoms.  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  He does have a history of atrial fibrillation and V. tach.  Pacer/defibrillator was interrogated.  Patient has had 2 atrial arrhythmias since January 22, but the last one was on January 28, no arrhythmia today.  No ventricular arrhythmias noted.  No V. tach.  No firing of the defibrillator.  He has been in atrial arrhythmia 45% of the time.  There is also indication of possible fluid accumulation suggestive of congestive heart failure.  Patient complaining of right elbow pain.  X-ray negative.  Patient reporting of right hip pain.  X-ray of the hip does not show any femoral fracture but he does have a comminuted and displaced fracture of the iliac crest.  This was discussed with Dr. Ihor Gully, on-call for orthopedics.  He confirms that this is not a surgical fracture.  He reports  that the patient is weightbearing as tolerated, follow along radiographically by Ortho after discharge.  Final Clinical Impressions(s) / ED Diagnoses   Final diagnoses:  Closed fracture of right iliac crest, initial encounter [facility])  Syncope, unspecified syncope type    ED Discharge Orders    None       Orpah Greek, MD 03/04/18 [phone]

## 2018-03-04 NOTE — Consult Note (Signed)
Reason for Consult:Right iliac fx Referring Physician: R Schertz  [name] is an 85 y.o. male.  HPI: [name] had a syncopal event at home. He was going to the kitchen and the next thing he knew he was on the floor. He had significant right hip pain and could not get up. A neighbor came to his assistance and he was brought to the ED. X-rays showed a right iliac wing fx and orthopedic surgery was consulted. He is retired and lives with his wife who unfortunately broke her hip in November.  Past Medical History:  Diagnosis Date  . AICD (automatic cardioverter/defibrillator) present    [employer]n PPM/ICD  . Coronary artery disease    [facility]  [facility]  . Diabetes mellitus   . Dysrhythmia   . Endocarditis, valve unspecified    Mitral  . Headache    hx migraines 6-7 x mo  . History of migraine headaches   . History of seasonal allergies   . ICD (implantable cardiac defibrillator) in place   . PAF (paroxysmal atrial fibrillation) ([place])    NO LAA occlusion at the time of surgery  [name] 2018  . Pleural effusion   . PVC's (premature ventricular contractions)   . Ventricular tachycardia, polymorphic ([place])    status post ICD implantation    Past Surgical History:  Procedure Laterality Date  . CARDIAC CATHETERIZATION  04/03/2007   EF 40%  . CARDIAC CATHETERIZATION  02/06/2006   EF 45%. ANTERIOR HYPOKINESIS  . CARDIAC CATHETERIZATION  05/29/2000   EF 50%. MILD ANTERIOR HYPOKINESIS  . CARDIAC CATHETERIZATION  10/25/1999   EF 50%. SEVERE MITRAL REGURGITATION  . CARDIAC CATHETERIZATION  01/06/2003   EF 50%  . CARDIAC DEFIBRILLATOR PLACEMENT    . CATARACT EXTRACTION W/ INTRAOCULAR LENS  IMPLANT, BILATERAL    . CORONARY ARTERY BYPASS GRAFT  2001   2 VESSEL CABG AND MITRAL VALVE REPAIR. HE HAD A LIMA GRAFT TO THE LAD AND RADIAL ARTERY  GRAFT TO THE OBTUSE MARGINAL  OF LEFT CIRCUMFLEX  . EP IMPLANTABLE DEVICE N/A 11/06/2014   Procedure: ICD Generator Changeout;  Surgeon:  Deboraha Sprang, MD;  Location: [facility];  Service: Cardiovascular;  Laterality: N/A;  . FOREIGN BODY REMOVAL Right 06/21/2017   Procedure: FOREIGN BODY REMOVAL ADULT RIGHT RING FINGER;  Surgeon: Leanora Cover, MD;  Location: [facility];  Service: Orthopedics;  Laterality: Right;  . HEMORRHOID SURGERY    . HEMORROIDECTOMY    . INTRAVASCULAR PRESSURE WIRE/FFR STUDY N/A 01/05/2017   Procedure: INTRAVASCULAR PRESSURE WIRE/FFR STUDY;  Surgeon: Sherren Mocha, MD;  Location: [facility];  Service: Cardiovascular;  Laterality: N/A;  . KNEE ARTHROSCOPY     RIGHT KNEE  . LEFT HEART CATHETERIZATION WITH CORONARY ANGIOGRAM N/A 07/04/2012   Procedure: LEFT HEART CATHETERIZATION WITH CORONARY ANGIOGRAM;  Surgeon: Sherren Mocha, MD;  Location: [facility] C[facility] LAB;  Service: Cardiovascular;  Laterality: N/A;  . MITRAL VALVE REPLACEMENT     No LAA occlusion at the time of surgery  Karie Soda report 2018  . RIGHT/LEFT HEART CATH AND CORONARY/GRAFT ANGIOGRAPHY N/A 01/05/2017   Procedure: RIGHT/LEFT HEART CATH AND CORONARY/GRAFT ANGIOGRAPHY;  Surgeon: Sherren Mocha, MD;  Location: [facility];  Service: Cardiovascular;  Laterality: N/A;  . TRANSTHORACIC ECHOCARDIOGRAM  04/02/2007   EF 35%    Family History  Problem Relation Age of Onset  . Heart disease Father   . Emphysema Father   . Stroke Mother   . Cancer  Sister        breast cancer  . Heart disease Paternal Grandmother   . Heart disease Paternal Grandfather   . Diabetes Other        grandmother, aunt, uncle  . Cancer Other        lung cancer, aunt    Social History:  reports that he quit smoking about 54 years ago. His smoking use included cigarettes. He started smoking about 59 years ago. He has a 5.00 pack-year smoking history. He has never used smokeless tobacco. He reports current alcohol use. He reports that he does not use drugs.  Allergies:  Allergies  Allergen Reactions  . Latex Other (See Comments)    REDNESS AT REACTION SITE.   Marland Kitchen Metformin Diarrhea  . Rivaroxaban Other (See Comments)    Headaches, blurred vision  . Other Other (See Comments)    BEE STINGS   . Sotalol Other (See Comments)    "BLUE TOES"- cut his circulation off  . Warfarin Sodium Other (See Comments)    REACTION: migraine headaches/vision impariment    Medications: I have reviewed the patient's current medications.  Results for orders placed or performed during the hospital encounter of 03/03/18 (from the past 48 hour(s))  Basic metabolic panel     Status: Abnormal   Collection Time: 03/04/18 12:30 AM  Result Value Ref Range   Sodium 133 (L) 135 - 145 mmol/L   Potassium 4.3 3.5 - 5.1 mmol/L   Chloride 99 98 - 111 mmol/L   CO2 24 22 - 32 mmol/L   Glucose, Bld 127 (H) 70 - 99 mg/dL   BUN 11 8 - 23 mg/dL   Creatinine, Ser 0.93 0.61 - 1.24 mg/dL   Calcium 8.5 (L) 8.9 - 10.3 mg/dL   GFR calc non Af Amer >60 >60 mL/min   GFR calc Af Amer >60 >60 mL/min   Anion gap 10 5 - 15    Comment: Performed at [facility], [place] [address], [place], [place] [address]  CBC     Status: Abnormal   Collection Time: 03/04/18 12:30 AM  Result Value Ref Range   WBC 8.2 4.0 - 10.5 K/uL   RBC 3.48 (L) 4.22 - 5.81 MIL/uL   Hemoglobin 11.8 (L) 13.0 - 17.0 g/dL   HCT 34.5 (L) 39.0 - 52.0 %   MCV 99.1 80.0 - 100.0 fL   MCH 33.9 26.0 - 34.0 pg   MCHC 34.2 30.0 - 36.0 g/dL   RDW 13.2 11.5 - 15.5 %   Platelets 114 (L) 150 - 400 K/uL    Comment: REPEATED TO VERIFY PLATELET COUNT CONFIRMED BY SMEAR SPECIMEN CHECKED FOR CLOTS Immature Platelet Fraction may be clinically indicated, consider ordering this additional test [id number]    nRBC 0.0 0.0 - 0.2 %    Comment: Performed at [facility], [place] [address], [place], [place] [address]  Brain natriuretic peptide     Status: Abnormal   Collection Time: 03/04/18 12:30 AM  Result Value Ref Range   B Natriuretic Peptide 219.8 (H) 0.0 - 100.0 pg/mL    Comment: Performed at [facility] [address], [place],  [address]  I-stat troponin, ED     Status: None   Collection Time: 03/04/18 12:41 AM  Result Value Ref Range   Troponin i, poc 0.01 0.00 - 0.08 ng/mL   Comment 3            Comment: Due to the  release kinetics of cTnI, a negative result within the first hours of the onset of symptoms does not rule out myocardial infarction with certainty. If myocardial infarction is still suspected, repeat the test at appropriate intervals.   Troponin I - Now Then Q6H     Status: None   Collection Time: 03/04/18  3:22 AM  Result Value Ref Range   Troponin I <0.03 <0.03 ng/mL    Comment: Performed at [facility], [place] [address], [place], [place] [address]  Magnesium     Status: None   Collection Time: 03/04/18  3:22 AM  Result Value Ref Range   Magnesium 1.8 1.7 - 2.4 mg/dL    Comment: Performed at [facility], [place] [address], [place], [place] [address]  Troponin I - Now Then Q6H     Status: None   Collection Time: 03/04/18  8:34 AM  Result Value Ref Range   Troponin I <0.03 <0.03 ng/mL    Comment: Performed at [facility] [address], [place], [place] [address]  Basic metabolic panel     Status: Abnormal   Collection Time: 03/04/18  8:34 AM  Result Value Ref Range   Sodium 133 (L) 135 - 145 mmol/L   Potassium 5.9 (H) 3.5 - 5.1 mmol/L    Comment: HEMOLYSIS AT THIS LEVEL MAY AFFECT RESULT   Chloride 102 98 - 111 mmol/L   CO2 23 22 - 32 mmol/L   Glucose, Bld 147 (H) 70 - 99 mg/dL   BUN 13 8 - 23 mg/dL   Creatinine, Ser 0.95 0.61 - 1.24 mg/dL   Calcium 7.9 (L) 8.9 - 10.3 mg/dL   GFR calc non Af Amer >60 >60 mL/min   GFR calc Af Amer >60 >60 mL/min   Anion gap 8 5 - 15    Comment: Performed at [facility], [place] [address], [place], [place] [address]    Dg Chest 2 View  Result Date: 03/04/2018 CLINICAL DATA:  Patient fell tonight. EXAM: CHEST - 2 VIEW COMPARISON:  12/18/2017 FINDINGS: Postoperative changes in the mediastinum. Cardiac  pacemaker. Cardiac enlargement. No vascular congestion, edema, or consolidation. No blunting of costophrenic angles. No pneumothorax. Mediastinal contours appear intact. IMPRESSION: Cardiac enlargement. No evidence of active pulmonary disease. Electronically Signed   By: Lucienne Capers M.D.   On: 03/04/2018 01:04   Dg Elbow Complete Right  Result Date: 03/04/2018 CLINICAL DATA:  Pain and laceration after a fall. EXAM: RIGHT ELBOW - COMPLETE 3+ VIEW COMPARISON:  None. FINDINGS: Right elbow appears intact. No evidence of acute fracture or subluxation. No focal bone lesion or bone destruction. Bone cortex and trabecular architecture appear intact. No significant effusion. No radiopaque soft tissue foreign bodies. Surgical clips in the soft tissues. IMPRESSION: 1. No acute bony abnormalities. 2. No radiopaque foreign body. Electronically Signed   By: Lucienne Capers M.D.   On: 03/04/2018 01:05   Ct Head Wo Contrast  Result Date: 03/04/2018 CLINICAL DATA:  Head trauma, patient on anticoagulation EXAM: CT HEAD WITHOUT CONTRAST TECHNIQUE: Contiguous axial images were obtained from the base of the skull through the vertex without intravenous contrast. COMPARISON:  12/31/2017 FINDINGS: Brain: No evidence of acute infarction, hemorrhage, extra-axial collection, ventriculomegaly, or mass effect. Generalized cerebral atrophy. Periventricular white matter low attenuation likely secondary to microangiopathy. Vascular: Cerebrovascular atherosclerotic calcifications are noted. Skull: Negative for fracture or focal lesion. Sinuses/Orbits: Visualized portions of the orbits are unremarkable. Visualized portions of the paranasal sinuses are  unremarkable. Visualized portions of the mastoid air cells are unremarkable. Other: None. IMPRESSION: No acute intracranial pathology. Electronically Signed   By: Kathreen Devoid   On: 03/04/2018 01:14   Dg Hip Unilat  With Pelvis 2-3 Views Right  Result Date: 03/04/2018 CLINICAL DATA:   Right hip pain after a fall. EXAM: DG HIP (WITH OR WITHOUT PELVIS) 2-3V RIGHT COMPARISON:  None. FINDINGS: Comminuted and displaced acute fractures of the right anterior iliac bone. Right hip appears intact. Degenerative changes in the lower lumbar spine and both hips. SI joints and symphysis pubis are not displaced. Vascular calcifications. Soft tissue swelling over the right hip, likely contusion. IMPRESSION: Acute posttraumatic comminuted and displaced fractures of the anterior superior right iliac bone. Electronically Signed   By: Lucienne Capers M.D.   On: 03/04/2018 01:06    Review of Systems  Constitutional: Negative for weight loss.  HENT: Negative for ear discharge, ear pain, hearing loss and tinnitus.   Eyes: Negative for blurred vision, double vision, photophobia and pain.  Respiratory: Negative for cough, sputum production and shortness of breath.   Cardiovascular: Negative for chest pain.  Gastrointestinal: Negative for abdominal pain, nausea and vomiting.  Genitourinary: Negative for dysuria, flank pain, frequency and urgency.  Musculoskeletal: Positive for joint pain (Right hip). Negative for back pain, falls, myalgias and neck pain.  Neurological: Negative for dizziness, tingling, sensory change, focal weakness, loss of consciousness and headaches.  Endo/Heme/Allergies: Does not bruise/bleed easily.  Psychiatric/Behavioral: Negative for depression, memory loss and substance abuse. The patient is not nervous/anxious.    Blood pressure (!) 114/57, pulse 69, temperature 98.6 F (37 C), temperature source Oral, resp. rate 15, height 6\' 1"  (1.854 m), weight 90.7 kg, SpO2 100 %. Physical Exam  Constitutional: He appears well-developed and well-nourished. No distress.  HENT:  Head: Normocephalic and atraumatic.  Eyes: Conjunctivae are normal. Right eye exhibits no discharge. Left eye exhibits no discharge. No scleral icterus.  Neck: Normal range of motion.  Cardiovascular: Normal  rate and regular rhythm.  Respiratory: Effort normal. No respiratory distress.  Musculoskeletal:     Comments: RLE No traumatic wounds, ecchymosis, or rash  TTP hip  No knee or ankle effusion  Knee stable to varus/ valgus and anterior/posterior stress  Sens DPN, SPN, TN intact  Motor EHL, ext, flex, evers 5/5  DP 1+, PT 1+, No significant edema  Neurological: He is alert.  Skin: Skin is warm and dry. He is not diaphoretic.  Psychiatric: He has a normal mood and affect. His behavior is normal.    Assessment/Plan: Right iliac wing fx -- May be WBAT RLE. He should f/u with Dr. Lyla Glassing (his preference) in a few weeks. PT/OT. May need some sort of formalized rehab before returning home. Multiple medical problems including orthostatic hypotension, CAD s/p AICD -- per primary service    Lisette Abu, PA-C Orthopedic Surgery [phone] 03/04/2018, 3:23 PM

## 2018-03-04 NOTE — ED Notes (Signed)
Nurse will draw labs. 

## 2018-03-04 NOTE — Progress Notes (Addendum)
Pt seen and examined. No charge (pt admitted after midnight last night).  On exam no vol overload, alert and in no distress, calm lying flat.  L chest PPM/ ICD.  Appreciate ortho consult.  Awaiting PT eval.  Holding diabetic meds, will write for SSI lower scale.  Resume home losartan and lasix po. Ordered lokelma for high K+. Check bmet in am.   Kelly Splinter MD [facility] 03/04/2018, 4:31 PM

## 2018-03-04 NOTE — Progress Notes (Signed)
  Echocardiogram 2D Echocardiogram has been performed.  [name] 03/04/2018, 9:00 AM

## 2018-03-04 NOTE — Progress Notes (Signed)
Admission: Patient arrived via stretcher from ED at 1415. Unit introduction provided. Safety precautions discussed. Patient verbalizes an understanding of safety instructions. Call bell within reach. Encouraged to use call bell for assistance.

## 2018-03-04 NOTE — ED Triage Notes (Signed)
Pt BIB [facility] for a fall secondary to a syncopal episode. EMS reports pt was sitting on the floor upon their arrival. EMS reports no shortening or rotation to the right hip. EMS reports brief episode of LOC. EMS reports pt is in a paced rhythm with a ICD in place. EMS advised they placed an 18 gauge saline lock and administered a total of 138mcg of fentanyl.   Pt reports he was going to make a cup of coffee after the Super Bowl when he blacked out, fell, and landed on his right hip. Pt reports his pain is a 5 at this time.

## 2018-03-05 DIAGNOSIS — I25118 Atherosclerotic heart disease of native coronary artery with other forms of angina pectoris: Secondary | ICD-10-CM | POA: Diagnosis not present

## 2018-03-05 DIAGNOSIS — I472 Ventricular tachycardia: Secondary | ICD-10-CM

## 2018-03-05 DIAGNOSIS — S32301A Unspecified fracture of right ilium, initial encounter for closed fracture: Principal | ICD-10-CM

## 2018-03-05 DIAGNOSIS — J449 Chronic obstructive pulmonary disease, unspecified: Secondary | ICD-10-CM | POA: Diagnosis not present

## 2018-03-05 DIAGNOSIS — Z95810 Presence of automatic (implantable) cardiac defibrillator: Secondary | ICD-10-CM | POA: Diagnosis not present

## 2018-03-05 DIAGNOSIS — R55 Syncope and collapse: Secondary | ICD-10-CM | POA: Diagnosis not present

## 2018-03-05 DIAGNOSIS — I4811 Longstanding persistent atrial fibrillation: Secondary | ICD-10-CM

## 2018-03-05 LAB — CBC
HCT: 32.7 % — ABNORMAL LOW (ref 39.0–52.0)
Hemoglobin: 11 g/dL — ABNORMAL LOW (ref 13.0–17.0)
MCH: 32.7 pg (ref 26.0–34.0)
MCHC: 33.6 g/dL (ref 30.0–36.0)
MCV: 97.3 fL (ref 80.0–100.0)
Platelets: 94 10*3/uL — ABNORMAL LOW (ref 150–400)
RBC: 3.36 MIL/uL — ABNORMAL LOW (ref 4.22–5.81)
RDW: 13.2 % (ref 11.5–15.5)
WBC: 4.9 10*3/uL (ref 4.0–10.5)
nRBC: 0 % (ref 0.0–0.2)

## 2018-03-05 LAB — BASIC METABOLIC PANEL
Anion gap: 9 (ref 5–15)
BUN: 10 mg/dL (ref 8–23)
CO2: 24 mmol/L (ref 22–32)
Calcium: 8.1 mg/dL — ABNORMAL LOW (ref 8.9–10.3)
Chloride: 99 mmol/L (ref 98–111)
Creatinine, Ser: 0.81 mg/dL (ref 0.61–1.24)
GFR calc Af Amer: >60 mL/min (ref >60)
GFR calc non Af Amer: >60 mL/min (ref >60)
Glucose, Bld: 181 mg/dL — ABNORMAL HIGH (ref 70–99)
Potassium: 4 mmol/L (ref 3.5–5.1)
Sodium: 132 mmol/L — ABNORMAL LOW (ref 135–145)

## 2018-03-05 LAB — GLUCOSE, CAPILLARY
Glucose-Capillary: 129 mg/dL — ABNORMAL HIGH (ref 70–99)
Glucose-Capillary: 184 mg/dL — ABNORMAL HIGH (ref 70–99)
Glucose-Capillary: 136 mg/dL — ABNORMAL HIGH (ref 70–99)
Glucose-Capillary: 161 mg/dL — ABNORMAL HIGH (ref 70–99)

## 2018-03-05 NOTE — NC FL2 (Signed)
[place] LEVEL OF CARE SCREENING TOOL     IDENTIFICATION  Patient Name: [name] Birthdate: March 29, 1931 Sex: male Admission Date (Current Location): 03/03/2018  [facility] and [place] Number:  Herbalist and Address:  [facility], [place] [address] [place], [place] [address]      Provider Number: [id number]  Attending Physician Name and Address:  Roney Jaffe, MD  Relative Name and Phone Number:       Current Level of Care: Hospital Recommended Level of Care: [place] Prior Approval Number:    Date Approved/Denied:   PASRR Number: [id number]  Discharge Plan: SNF    Current Diagnoses: Patient Active Problem List   Diagnosis Date Noted  . Syncope 03/04/2018  . Pelvic fracture ([place]) 03/04/2018  . Asthma-COPD overlap syndrome ([place]) 02/22/2018  . Cough 02/22/2018  . Orthostatic presyncope 08/01/2010  . Coronary artery disease with exertional angina ([place]) 08/01/2010  . Ventricular tachycardia, polymorphic ([place])   . ATRIAL FIBRILLATION 06/15/2008  . PREMATURE VENTRICULAR CONTRACTIONS 04/23/2008  . RAYNAUD'S DISEASE 04/23/2008  . Automatic implantable cardioverter-defibrillator in situ 04/23/2008    Orientation RESPIRATION BLADDER Height & Weight     Self, Time, Situation, Place  Normal Continent Weight: 209 lb 10.5 oz (95.1 kg) Height:  6\' 1"  (185.4 cm)  BEHAVIORAL SYMPTOMS/MOOD NEUROLOGICAL BOWEL NUTRITION STATUS  (None) (None) Continent Diet(2 gram sodium)  AMBULATORY STATUS COMMUNICATION OF NEEDS Skin   Limited Assist Verbally Other (Comment)(Skin tear.)                       Personal Care Assistance Level of Assistance  Bathing, Feeding, Dressing Bathing Assistance: Limited assistance Feeding assistance: Limited assistance(Supervision) Dressing Assistance: Limited assistance     Functional Limitations Info  Sight, Hearing, Speech Sight Info: Adequate Hearing Info: Adequate Speech  Info: Adequate    SPECIAL CARE FACTORS FREQUENCY  PT (By licensed PT), OT (By licensed OT)     PT Frequency: 5 x week OT Frequency: 5 x week            Contractures Contractures Info: Not present    Additional Factors Info  Code Status, Allergies Code Status Info: Full code Allergies Info: Latex, Metformin, Rivaroxaban, Bee stings, Sotalol, Warfarin Sodium.           Current Medications (03/05/2018):  This is the current hospital active medication list Current Facility-Administered Medications  Medication Dose Route Frequency Provider Last Rate Last Dose  . 0.9 %  sodium chloride infusion  250 mL Intravenous PRN Phillips Grout, MD      . acetaminophen (TYLENOL) tablet 650 mg  650 mg Oral Q6H PRN Phillips Grout, MD       Or  . acetaminophen (TYLENOL) suppository 650 mg  650 mg Rectal Q6H PRN Phillips Grout, MD      . apixaban (ELIQUIS) tablet 5 mg  5 mg Oral BID Phillips Grout, MD   5 mg at 03/05/18 0941  . atorvastatin (LIPITOR) tablet 80 mg  80 mg Oral q1800 Roney Jaffe, MD   80 mg at 03/04/18 1709  . dofetilide ([name]) capsule 500 mcg  500 mcg Oral BID Phillips Grout, MD   500 mcg at 03/05/18 0941  . HYDROcodone-acetaminophen (NORCO/VICODIN) 5-325 MG per tablet 1-2 tablet  1-2 tablet Oral Q4H PRN Phillips Grout, MD   2 tablet at 03/05/18 0941  . insulin aspart (novoLOG) injection 0-5 Units  0-5 Units  Subcutaneous QHS Roney Jaffe, MD      . insulin aspart (novoLOG) injection 0-9 Units  0-9 Units Subcutaneous TID WC Roney Jaffe, MD   2 Units at 03/05/18 1217  . losartan (COZAAR) tablet 25 mg  25 mg Oral Daily Roney Jaffe, MD   25 mg at 03/05/18 0941  . sodium chloride flush (NS) 0.9 % injection 3 mL  3 mL Intravenous Q12H Derrill Kay A, MD   3 mL at 03/05/18 0941  . sodium chloride flush (NS) 0.9 % injection 3 mL  3 mL Intravenous Q12H Derrill Kay A, MD   3 mL at 03/05/18 0941  . sodium chloride flush (NS) 0.9 % injection 3 mL  3 mL Intravenous PRN  Derrill Kay A, MD      . sodium zirconium cyclosilicate (LOKELMA) packet 10 g  10 g Oral Daily Roney Jaffe, MD   10 g at 03/05/18 1217  . umeclidinium-vilanterol (ANORO ELLIPTA) 62.5-25 MCG/INH 1 puff  1 puff Inhalation Daily Roney Jaffe, MD   1 puff at 03/05/18 0712     Discharge Medications: Please see discharge summary for a list of discharge medications.  Relevant Imaging Results:  Relevant Lab Results:   Additional Information SS#: [id number]  Candie Chroman, LCSW

## 2018-03-05 NOTE — Discharge Instructions (Signed)

## 2018-03-05 NOTE — Progress Notes (Signed)
[facility] Progress Note  Subjective: doing well today, got up and walked 4-8 feet in the room w/ a walker and PT today.  Says he might be going to CIR.    Vitals:   03/04/18 1939 03/05/18 0359 03/05/18 0712 03/05/18 0725  BP: 112/66 118/72  (!) 99/53  Pulse: 72 71  69  Resp: 17 18  18   Temp: 98.4 F (36.9 C) 98.1 F (36.7 C)  97.7 F (36.5 C)  TempSrc: Oral Oral  Oral  SpO2: 99% 99% 100% 96%  Weight:  95.1 kg    Height:        Inpatient medications: . apixaban  5 mg Oral BID  . atorvastatin  80 mg Oral q1800  . dofetilide  500 mcg Oral BID  . insulin aspart  0-5 Units Subcutaneous QHS  . insulin aspart  0-9 Units Subcutaneous TID WC  . losartan  25 mg Oral Daily  . sodium chloride flush  3 mL Intravenous Q12H  . sodium chloride flush  3 mL Intravenous Q12H  . sodium zirconium cyclosilicate  10 g Oral Daily  . umeclidinium-vilanterol  1 puff Inhalation Daily   . sodium chloride     sodium chloride, acetaminophen **OR** acetaminophen, HYDROcodone-acetaminophen, sodium chloride flush  Exam: General: alert , no distress, calm and WDWN Eyes: PERRL, lids and conjunctivae normal Neck: normal, supple, no masses, no thyromegaly Respiratory: clear to auscultation bilaterally, no wheezing, no crackles. Normal respiratory effort. Cardiovascular: Regular rate and rhythm, no murmurs / rubs / gallops. No LE edema bilat.  Abdomen: no tenderness, no masses palpated. No hsm, BS + Musculoskeletal: no clubbing / cyanosis. No joint deformity upper and lower extremities. Good ROM, no contractures. Normal muscle tone Skin: no rashes, lesions, ulcers. No induration Neurologic: CN 2-12 grossly intact. Sensation intact, DTR normal. Strength 5/5 in all 4.  Psychiatric: Normal judgment and insight. Alert and oriented x 3. Normal mood.     Presentation Summary:  ETHAN CLAYBURN is a 85 y.o. male with medical history significant of orthostatic hypotension, coronary artery disease  status post AICD/pacemaker, history of ventricular tachycardia comes in with a sudden syncopal episode while watching the Super Bowl.  Patient was watching Super Bowl tonight he got up to go to the kitchen to get him another couple coffee when he suddenly passed out.  Patient had no prodrome.  He does not really remember anything happening before next thing he knew he woke up on the floor coffee was splattered all over.  He had a difficult time getting up and his wife was sleeping in bed.  He did not want a call 911 because the door was locked he was scared they would break down the door.  So he called his neighbor who came to his assistance.  Patient is having difficulty bearing weight to his right hip.  Other than that he has no injuries.  He reports that the syncopal episode literally was less than a minute and he woke up pretty immediately normal except for the pelvis hip pain.  Patient is being referred for admission for syncope and pelvic fracture.        Hospital Problems/ Course:   Syncope: pt has hx of orthostatic hypotension in the past. He was watching the Super Bowl and got up quickly during a commercial break, he passed out and fell on the floor on his right side. In ED pt had no neurological symptoms.  His pacemaker interrogation done in ED did not show  any significant arrhythmias prior to the syncopal episode. See ED note (only atrial arrythmias dating back to Jan 2020).  Pt was admitted - no arrhythmias on telemetry over 48 hrs, will dc - ECHO showed no new abnormalities, same as last echo w/ EF 40-45% and inferior/ apical akinesis - I think this was simply orthostatic/ vagal syncope w/ pt's history. ECHO and interrogation are not revealing for new issues. No need for cardiology eval at this time.  Ran this by cardiology briefly over phone and they agreed     R Pelvic fracture: due to fall. Pt broke off the upper rim of the R anterior iliac crest. See xrays.  - Orthopedic surgery  consulted, advising weightbearing as tolerates, nonsurgical Rx.  - CIR consulted today - prn Vicodin   Chronic systolic heart failure: f/b Dr Burt Knack and Dr Olin Pia cardiology. - Pt states he saw Dr Caryl Comes about 2 wks ago and had new LE edema, he was started on daily po lasix and then saw MD again last week and the edema was gone so the lasix was changed to prn. No edema here, will dc.  - patient was given new Rx for losartan also per cardiology visit last week but hadn't started prior to admission > have started losartan 25 qd here, see how he tolerates; if lightheaded or becomes hypotensive stop losartan +/- call cardiology    ATRIAL FIBRILLATION-currently rate controlled    Automatic implantable cardioverter-defibrillator in situ-noted    Coronary artery disease with exertional angina (HCC)-stable    Ventricular tachycardia, polymorphic (HCC)-noted has AICD    Asthma-COPD overlap syndrome (HCC)-stable at this time     DVT prophylaxis: SCDs Eliquis Code Status: Full Family Communication: daughter at bedside today Disposition Plan: consulting CIR today Consults called: CIR, ortho Admission status: Observation/ telemetry     Kelly Splinter MD [facility] pgr [phone] 03/05/2018, 1:25 PM   Recent Labs  Lab 03/04/18 0030 03/04/18 0834 03/05/18 0248  NA 133* 133* 132*  K 4.3 5.9* 4.0  CL 99 102 99  CO2 24 23 24   GLUCOSE 127* 147* 181*  BUN 11 13 10   CREATININE 0.93 0.95 0.81  CALCIUM 8.5* 7.9* 8.1*   No results for input(s): AST, ALT, ALKPHOS, BILITOT, PROT, ALBUMIN in the last 168 hours. Recent Labs  Lab 03/04/18 0030 03/05/18 0248  WBC 8.2 4.9  HGB 11.8* 11.0*  HCT 34.5* 32.7*  MCV 99.1 97.3  PLT 114* 94*   Iron/TIBC/Ferritin/ %Sat No results found for: IRON, TIBC, FERRITIN, IRONPCTSAT

## 2018-03-05 NOTE — Clinical Social Work Placement (Signed)
   CLINICAL SOCIAL WORK PLACEMENT  NOTE  Date:  03/05/2018  Patient Details  Name: SHANTA DORVIL MRN: [id number] Date of Birth: 08/02/1931  Clinical Social Work is seeking post-discharge placement for this patient at the [place] level of care (*CSW will initial, date and re-position this form in  chart as items are completed):      Patient/family provided with [place] Work Department's list of facilities offering this level of care within the geographic area requested by the patient (or if unable, by the patient's family).      Patient/family informed of their freedom to choose among providers that offer the needed level of care, that participate in Medicare, Medicaid or managed care program needed by the patient, have an available bed and are willing to accept the patient.      Patient/family informed of [facility]'s ownership interest in [facility] and [facility], as well as of the fact that they are under no obligation to receive care at these facilities.  PASRR submitted to EDS on 03/05/18     PASRR number received on 03/05/18     Existing PASRR number confirmed on       FL2 transmitted to all facilities in geographic area requested by pt/family on 03/05/18     FL2 transmitted to all facilities within larger geographic area on       Patient informed that his/her managed care company has contracts with or will negotiate with certain facilities, including the following:            Patient/family informed of bed offers received.  Patient chooses bed at       Physician recommends and patient chooses bed at      Patient to be transferred to   on  .  Patient to be transferred to facility by       Patient family notified on   of transfer.  Name of family member notified:        PHYSICIAN Please sign FL2     Additional Comment:    _______________________________________________ Candie Chroman, LCSW 03/05/2018, 1:52  PM

## 2018-03-05 NOTE — Care Management Obs Status (Signed)
[place] NOTIFICATION   Patient Details  Name: [name] MRN: [id number] Date of Birth: 1931-09-04   Medicare Observation Status Notification Given:  Yes    Maryclare Labrador, RN 03/05/2018, 1:37 PM

## 2018-03-05 NOTE — Evaluation (Signed)
Physical Therapy Evaluation Patient Details Name: [name] MRN: [id number] DOB: November 23, 1931 Today's Date: 03/05/2018   History of Present Illness  Patient is an 85 yo male who presents after a syncopal episode with traumatic Right iliac wing fx.   Clinical Impression  Orders received for PT evaluation. Patient demonstrates deficits in functional mobility as indicated below. Will benefit from continued skilled PT to address deficits and maximize function. Will see as indicated and progress as tolerated.  Prior to admission, patient active and completely independent. Currently, patient with limitations in all aspects of movement from pain. Patient may benefit from [place] post acute rehabilitation to improve functional mobility in regard to ambulation, stair negotiation, and self care. Will recommend CIR consult at this time and follow progression as pain improves.    Follow Up Recommendations CIR;Supervision for mobility/OOB    Equipment Recommendations  Rolling walker with 5" wheels    Recommendations for Other Services       Precautions / Restrictions Precautions Precautions: Fall Restrictions Weight Bearing Restrictions: No      Mobility  Bed Mobility Overal bed mobility: Needs Assistance Bed Mobility: Rolling;Supine to Sit Rolling: Min guard   Supine to sit: Min assist     General bed mobility comments: min assist to bring right LE to EOB and max multi modal cues to reach for rail and elevate to sitting position at EOB  Transfers Overall transfer level: Needs assistance Equipment used: Rolling walker (2 wheeled) Transfers: Sit to/from Stand Sit to Stand: Min assist         General transfer comment: Min assist to power up to standing with elevated bed height. Heavy reliance on RW.   Ambulation/Gait Ambulation/Gait assistance: Min assist Gait Distance (Feet): 6 Feet Assistive device: Rolling walker (2 wheeled) Gait Pattern/deviations: Antalgic;Step-to  pattern;Decreased stride length;Trunk flexed Gait velocity: decreased Gait velocity interpretation: <1.31 ft/sec, indicative of household ambulator General Gait Details: VCs for sequencing with use of RW. Significant pain limiting mobility. Noted RLE buckling requiring increased physical assist to maintain upright.   Stairs            Wheelchair Mobility    Modified Rankin (Stroke Patients Only)       Balance Overall balance assessment: Needs assistance Sitting-balance support: Feet supported Sitting balance-Leahy Scale: Good       Standing balance-Leahy Scale: Poor Standing balance comment: heavy reliance on UE support                             Pertinent Vitals/Pain Pain Assessment: 0-10 Pain Score: 7  Pain Location: RLE    Home Living Family/patient expects to be discharged to:: Private residence Living Arrangements: Spouse/significant other Available Help at Discharge: Family;Available 24 hours/day Type of Home: House Home Access: Stairs to enter Entrance Stairs-Rails: Psychiatric nurse of Steps: 4 Home Layout: One level Home Equipment: Shower seat;Bedside commode;Walker - 2 wheels;Cane - single point      Prior Function Level of Independence: Independent               Hand Dominance   Dominant Hand: Right    Extremity/Trunk Assessment   Upper Extremity Assessment Upper Extremity Assessment: Defer to OT evaluation    Lower Extremity Assessment Lower Extremity Assessment: RLE deficits/detail RLE: Unable to fully assess due to pain       Communication   Communication: No difficulties  Cognition Arousal/Alertness: Awake/alert Behavior During Therapy: WFL for tasks assessed/performed Overall Cognitive  Status: Within Functional Limits for tasks assessed                                        General Comments      Exercises     Assessment/Plan    PT Assessment Patient needs continued PT  services  PT Problem List Decreased strength;Decreased range of motion;Decreased activity tolerance;Decreased balance;Decreased mobility;Decreased safety awareness;Pain       PT Treatment Interventions DME instruction;Gait training;Stair training;Functional mobility training;Therapeutic activities;Therapeutic exercise;Balance training;Patient/family education    PT Goals (Current goals can be found in the Care Plan section)  Acute Rehab PT Goals Patient Stated Goal: to go home PT Goal Formulation: With patient Time For Goal Achievement: 03/19/18 Potential to Achieve Goals: Good    Frequency Min 5X/week   Barriers to discharge Decreased caregiver support      Co-evaluation PT/OT/SLP Co-Evaluation/Treatment: Yes Reason for Co-Treatment: Complexity of the patient's impairments (multi-system involvement) PT goals addressed during session: Mobility/safety with mobility OT goals addressed during session: ADL's and self-care       AM-PAC PT "6 Clicks" Mobility  Outcome Measure Help needed turning from your back to your side while in a flat bed without using bedrails?: None Help needed moving from lying on your back to sitting on the side of a flat bed without using bedrails?: A Little Help needed moving to and from a bed to a chair (including a wheelchair)?: A Little Help needed standing up from a chair using your arms (e.g., wheelchair or bedside chair)?: A Little Help needed to walk in hospital room?: A Little Help needed climbing 3-5 steps with a railing? : A Lot 6 Click Score: 18    End of Session Equipment Utilized During Treatment: Gait belt Activity Tolerance: Patient limited by pain Patient left: in chair;with call bell/phone within reach Nurse Communication: Mobility status PT Visit Diagnosis: Unsteadiness on feet (R26.81);Difficulty in walking, not elsewhere classified (R26.2);Pain Pain - Right/Left: Right Pain - part of body: Hip    Time: 9622-2979 PT Time  Calculation (min) (ACUTE ONLY): 30 min   Charges:              Alben Deeds, PT DPT  Board Certified Neurologic Specialist [place] Pager [phone] Office [phone]   Duncan Dull 03/05/2018, 12:08 PM

## 2018-03-05 NOTE — Progress Notes (Signed)
MD Schertz made aware of positive orthostatic. Cardiology consult scheduled for tomorrow as per MD Schertz.

## 2018-03-05 NOTE — Progress Notes (Signed)
Inpatient Rehabilitation Admissions Coordinator  I notified SW, Judson Roch, that pt does not meet the medical neccesity need for an inpt hospital rehab program.   Danne Baxter, RN, MSN Rehab Admissions Coordinator [phone] 03/05/2018 1:55 PM

## 2018-03-05 NOTE — Plan of Care (Signed)
  Problem: Risk for Falls Goal: Ability to adjust to condition or change in health will improve Outcome: Progressing   Problem: Education: Goal: Knowledge of General Education information will improve Description Including pain rating scale, medication(s)/side effects and non-pharmacologic comfort measures Outcome: Progressing   Problem: Health Behavior/Discharge Planning: Goal: Ability to manage health-related needs will improve Outcome: Progressing   Problem: Clinical Measurements: Goal: Ability to maintain clinical measurements within normal limits will improve Outcome: Progressing Goal: Will remain free from infection Outcome: Progressing Goal: Diagnostic test results will improve Outcome: Progressing Goal: Respiratory complications will improve Outcome: Progressing Goal: Cardiovascular complication will be avoided Outcome: Progressing   Problem: Activity: Goal: Risk for activity intolerance will decrease Outcome: Progressing   Problem: Nutrition: Goal: Adequate nutrition will be maintained Outcome: Progressing   Problem: Coping: Goal: Level of anxiety will decrease Outcome: Progressing   Problem: Elimination: Goal: Will not experience complications related to bowel motility Outcome: Progressing Goal: Will not experience complications related to urinary retention Outcome: Progressing   Problem: Pain Managment: Goal: General experience of comfort will improve Outcome: Progressing   Problem: Safety: Goal: Ability to remain free from injury will improve Outcome: Progressing   Problem: Skin Integrity: Goal: Risk for impaired skin integrity will decrease Outcome: Progressing

## 2018-03-05 NOTE — Evaluation (Addendum)
Occupational Therapy Evaluation Patient Details Name: [name] MRN: [id number] DOB: September 17, 1931 Today's Date: 03/05/2018    History of Present Illness Patient is an 85 yo male who presents after a syncopal episode with traumatic Right iliac wing fx.    Clinical Impression   PTA, pt was living with his wife and was independent. Pt currently requiring Min A for LB ADLs and functional mobility with RW; mobilizing short distance to recliner. Pt presenting with decreased balance and activity tolerance due to pain at RLE. Pt highly motivated to participate in therapy despite pain. VSS throughout. Pt would benefit from further acute OT to facilitate safe dc. Recommend dc to CIR for further OT to optimize safety, independence with ADLs, and return to PLOF.      Follow Up Recommendations  CIR;Supervision/Assistance - 24 hour    Equipment Recommendations  None recommended by OT    Recommendations for Other Services       Precautions / Restrictions Precautions Precautions: Fall Restrictions Weight Bearing Restrictions: No      Mobility Bed Mobility Overal bed mobility: Needs Assistance Bed Mobility: Rolling;Supine to Sit Rolling: Min guard   Supine to sit: Min assist     General bed mobility comments: min assist to bring right LE to EOB and max multi modal cues to reach for rail and elevate to sitting position at EOB  Transfers Overall transfer level: Needs assistance Equipment used: Rolling walker (2 wheeled) Transfers: Sit to/from Stand Sit to Stand: Min assist         General transfer comment: Min assist to power up to standing with elevated bed height. Heavy reliance on RW.     Balance Overall balance assessment: Needs assistance Sitting-balance support: Feet supported Sitting balance-Leahy Scale: Good       Standing balance-Leahy Scale: Poor Standing balance comment: heavy reliance on UE support                           ADL either performed or  assessed with clinical judgement   ADL Overall ADL's : Needs assistance/impaired Eating/Feeding: Supervision/ safety;Set up;Sitting   Grooming: Supervision/safety;Set up;Sitting   Upper Body Bathing: Set up;Supervision/ safety;Sitting   Lower Body Bathing: Minimal assistance;Sit to/from stand   Upper Body Dressing : Supervision/safety;Set up;Sitting   Lower Body Dressing: Minimal assistance;Sit to/from stand Lower Body Dressing Details (indicate cue type and reason): Pt able to bend forward to adjut socks. However, reports significant pain. Min A for dynamic standing balance.  Toilet Transfer: Minimal assistance;Ambulation;RW           Functional mobility during ADLs: Minimal assistance;Rolling walker(short distance)       Vision         Perception     Praxis      Pertinent Vitals/Pain Pain Assessment: 0-10 Pain Score: 7  Pain Location: RLE Pain Descriptors / Indicators: Constant;Discomfort;Grimacing Pain Intervention(s): Monitored during session;Limited activity within patient's tolerance;Repositioned;Patient requesting pain meds-RN notified     Hand Dominance Right   Extremity/Trunk Assessment Upper Extremity Assessment Upper Extremity Assessment: Overall WFL for tasks assessed   Lower Extremity Assessment Lower Extremity Assessment: Defer to PT evaluation RLE: Unable to fully assess due to pain       Communication Communication Communication: No difficulties   Cognition Arousal/Alertness: Awake/alert Behavior During Therapy: WFL for tasks assessed/performed Overall Cognitive Status: Within Functional Limits for tasks assessed  General Comments  VSS    Exercises     Shoulder Instructions      Home Living Family/patient expects to be discharged to:: Private residence Living Arrangements: Spouse/significant other Available Help at Discharge: Family;Available 24 hours/day Type of Home:  House Home Access: Stairs to enter [employer] of Steps: 4 Entrance Stairs-Rails: Right;Left Home Layout: One level     Bathroom Shower/Tub: Occupational psychologist: Standard     Home Equipment: Shower seat;Bedside commode;Walker - 2 wheels;Cane - single point          Prior Functioning/Environment Level of Independence: Independent                 OT Problem List: Decreased strength;Decreased range of motion;Decreased activity tolerance;Impaired balance (sitting and/or standing);Decreased knowledge of use of DME or AE;Decreased knowledge of precautions;Pain      OT Treatment/Interventions: Self-care/ADL training;Therapeutic exercise;Energy conservation;DME and/or AE instruction;Therapeutic activities;Patient/family education    OT Goals(Current goals can be found in the care plan section) Acute Rehab OT Goals Patient Stated Goal: to go home OT Goal Formulation: With patient Time For Goal Achievement: 03/19/18 Potential to Achieve Goals: Good  OT Frequency: Min 3X/week   Barriers to D/C:            Co-evaluation PT/OT/SLP Co-Evaluation/Treatment: Yes Reason for Co-Treatment: Complexity of the patient's impairments (multi-system involvement) PT goals addressed during session: Mobility/safety with mobility OT goals addressed during session: ADL's and self-care      AM-PAC OT "6 Clicks" Daily Activity     Outcome Measure Help from another person eating meals?: None Help from another person taking care of personal grooming?: A Little Help from another person toileting, which includes using toliet, bedpan, or urinal?: A Little Help from another person bathing (including washing, rinsing, drying)?: A Little Help from another person to put on and taking off regular upper body clothing?: None Help from another person to put on and taking off regular lower body clothing?: A Little 6 Click Score: 20   End of Session Equipment Utilized During  Treatment: Rolling walker Nurse Communication: Mobility status;Patient requests pain meds  Activity Tolerance: Patient tolerated treatment well Patient left: in chair;with call bell/phone within reach  OT Visit Diagnosis: Unsteadiness on feet (R26.81);Other abnormalities of gait and mobility (R26.89);Muscle weakness (generalized) (M62.81);Pain Pain - Right/Left: Right Pain - part of body: Hip                Time: 7564-3329 OT Time Calculation (min): 20 min Charges:  OT General Charges $OT Visit: 1 Visit OT Evaluation $OT Eval Moderate Complexity: [place], OTR/L Acute Rehab Pager: [phone] Office: [place] 03/05/2018, 12:58 PM

## 2018-03-05 NOTE — Progress Notes (Signed)
Physical medicine rehabilitation consult requested chart reviewed. Patient with noted anterior superior right iliac bone fracture. He did very well with initial evaluation. Patient does not meet medical necessity for inpatient rehabilitation services recommendations are discharged to home or skilled nursing facility

## 2018-03-05 NOTE — Clinical Social Work Note (Signed)
[facility] will have a Nurse, learning disability. Per MD, patient will probably be ready for discharge tomorrow. Will start [facility] Medicare waiver authorization tomorrow once MD has confirmed patient is stable for discharge. Patient is aware and agreeable.  Dayton Scrape, [place]

## 2018-03-05 NOTE — Clinical Social Work Note (Signed)
Clinical Social Work Assessment  Patient Details  Name: [name] MRN: [id number] Date of Birth: [birth year]  Date of referral:  03/05/18               Reason for consult:  Facility Placement, Discharge Planning                Permission sought to share information with:  Chartered certified accountant granted to share information::  Yes, Verbal Permission Granted  Name::        Agency::  SNF's  Relationship::     Contact Information:     Housing/Transportation Living arrangements for the past 2 months:  Single Family Home Source of Information:  Patient, Medical Team Patient Interpreter Needed:  None Criminal Activity/Legal Involvement Pertinent to Current Situation/Hospitalization:  No - Comment as needed Significant Relationships:  Spouse Lives with:  Spouse Do you feel safe going back to the place where you live?  Yes Need for family participation in patient care:  Yes (Comment)  Care giving concerns:  PT recommending CIR once medically stable for discharge. CSW initiating SNF backup.    Social Worker assessment / plan:  CSW met with patient. No supports at bedside. CSW introduced role and explained that PT recommendations would be discussed. Discussed potential barriers to CIR. Patient is agreeable to SNF if he cannot go to CIR. Patient has Medicare and is under observation status. He is affiliated with [facility] so unless he is switched to inpatient status and here for three midnights under inpatient status, he will require [facility] Medicare waiver to go to SNF. Reviewed SNF list for facilities approved by [facility] waiver. First preference is [facility]. Admissions coordinator is aware and will review referral. No further concerns. CSW encouraged patient to contact CSW as needed. CSW will continue to follow patient for support and facilitate discharge to SNF, if needed, once medically stable.  Employment status:  Retired Forensic scientist:  Medicare PT  Recommendations:  [place] / Referral to community resources:  [place]  Patient/Family's Response to care:  Patient agreeable to SNF if he cannot go to [facility]. Patient's wife supportive and involved in patient's care. Patient appreciated social work intervention.  Patient/Family's Understanding of and Emotional Response to Diagnosis, Current Treatment, and Prognosis:  Patient has a good understanding of the reason for admission and his need for rehab prior to returning home. Patient appears happy with hospital care.  Emotional Assessment Appearance:  Appears stated age Attitude/Demeanor/Rapport:  Engaged, Gracious Affect (typically observed):  Accepting, Appropriate, Calm, Pleasant Orientation:  Oriented to Self, Oriented to Place, Oriented to  Time, Oriented to Situation Alcohol / Substance use:  Never Used Psych involvement (Current and /or in the community):  No (Comment)  Discharge Needs  Concerns to be addressed:  Care Coordination Readmission within the last 30 days:  No Current discharge risk:  Dependent with Mobility Barriers to Discharge:  Other, Continued Medical Work up(Medicare obs. May need [facility] waiver.)   Candie Chroman, LCSW 03/05/2018, 1:48 PM

## 2018-03-05 NOTE — Progress Notes (Signed)
Positive orthostatic blood pressure with patient reporting dizziness when rising from sitting to standing position.

## 2018-03-05 NOTE — Progress Notes (Deleted)
Rehab Admissions Coordinator Note:  Patient was screened by Retta Diones for appropriateness for an Inpatient Acute Rehab Consult.  At this time, patient is under observation status.  He did well requiring min assist to ambulate 6 feet.  He can likely discharge home with [facility] therapies.  However, if he changes to inpatient status and he needs more assistance, then may want to consider an inpatient rehab consult.  Call me for questions.  Jodell Cipro M 03/05/2018, 12:54 PM  I can be reached at [phone].

## 2018-03-06 ENCOUNTER — Other Ambulatory Visit: Payer: Medicare Other

## 2018-03-06 DIAGNOSIS — Z961 Presence of intraocular lens: Secondary | ICD-10-CM | POA: Diagnosis present

## 2018-03-06 DIAGNOSIS — I472 Ventricular tachycardia: Secondary | ICD-10-CM | POA: Diagnosis not present

## 2018-03-06 DIAGNOSIS — S32301A Unspecified fracture of right ilium, initial encounter for closed fracture: Secondary | ICD-10-CM | POA: Diagnosis not present

## 2018-03-06 DIAGNOSIS — Z833 Family history of diabetes mellitus: Secondary | ICD-10-CM | POA: Diagnosis not present

## 2018-03-06 DIAGNOSIS — I5022 Chronic systolic (congestive) heart failure: Secondary | ICD-10-CM | POA: Diagnosis present

## 2018-03-06 DIAGNOSIS — I25110 Atherosclerotic heart disease of native coronary artery with unstable angina pectoris: Secondary | ICD-10-CM | POA: Diagnosis not present

## 2018-03-06 DIAGNOSIS — Z825 Family history of asthma and other chronic lower respiratory diseases: Secondary | ICD-10-CM | POA: Diagnosis not present

## 2018-03-06 DIAGNOSIS — Z95810 Presence of automatic (implantable) cardiac defibrillator: Secondary | ICD-10-CM | POA: Diagnosis not present

## 2018-03-06 DIAGNOSIS — R55 Syncope and collapse: Secondary | ICD-10-CM | POA: Diagnosis not present

## 2018-03-06 DIAGNOSIS — Z9841 Cataract extraction status, right eye: Secondary | ICD-10-CM | POA: Diagnosis not present

## 2018-03-06 DIAGNOSIS — R278 Other lack of coordination: Secondary | ICD-10-CM | POA: Diagnosis not present

## 2018-03-06 DIAGNOSIS — J309 Allergic rhinitis, unspecified: Secondary | ICD-10-CM | POA: Diagnosis not present

## 2018-03-06 DIAGNOSIS — Z803 Family history of malignant neoplasm of breast: Secondary | ICD-10-CM | POA: Diagnosis not present

## 2018-03-06 DIAGNOSIS — M2550 Pain in unspecified joint: Secondary | ICD-10-CM | POA: Diagnosis not present

## 2018-03-06 DIAGNOSIS — Z801 Family history of malignant neoplasm of trachea, bronchus and lung: Secondary | ICD-10-CM | POA: Diagnosis not present

## 2018-03-06 DIAGNOSIS — Z8249 Family history of ischemic heart disease and other diseases of the circulatory system: Secondary | ICD-10-CM | POA: Diagnosis not present

## 2018-03-06 DIAGNOSIS — S32501D Unspecified fracture of right pubis, subsequent encounter for fracture with routine healing: Secondary | ICD-10-CM | POA: Diagnosis not present

## 2018-03-06 DIAGNOSIS — I480 Paroxysmal atrial fibrillation: Secondary | ICD-10-CM | POA: Diagnosis present

## 2018-03-06 DIAGNOSIS — E1151 Type 2 diabetes mellitus with diabetic peripheral angiopathy without gangrene: Secondary | ICD-10-CM | POA: Diagnosis present

## 2018-03-06 DIAGNOSIS — Z87891 Personal history of nicotine dependence: Secondary | ICD-10-CM | POA: Diagnosis not present

## 2018-03-06 DIAGNOSIS — J449 Chronic obstructive pulmonary disease, unspecified: Secondary | ICD-10-CM | POA: Diagnosis not present

## 2018-03-06 DIAGNOSIS — Z91040 Latex allergy status: Secondary | ICD-10-CM | POA: Diagnosis not present

## 2018-03-06 DIAGNOSIS — Z888 Allergy status to other drugs, medicaments and biological substances status: Secondary | ICD-10-CM | POA: Diagnosis not present

## 2018-03-06 DIAGNOSIS — I255 Ischemic cardiomyopathy: Secondary | ICD-10-CM | POA: Diagnosis present

## 2018-03-06 DIAGNOSIS — E119 Type 2 diabetes mellitus without complications: Secondary | ICD-10-CM | POA: Diagnosis not present

## 2018-03-06 DIAGNOSIS — R531 Weakness: Secondary | ICD-10-CM | POA: Diagnosis not present

## 2018-03-06 DIAGNOSIS — I25119 Atherosclerotic heart disease of native coronary artery with unspecified angina pectoris: Secondary | ICD-10-CM | POA: Diagnosis present

## 2018-03-06 DIAGNOSIS — Z9181 History of falling: Secondary | ICD-10-CM | POA: Diagnosis not present

## 2018-03-06 DIAGNOSIS — Z7901 Long term (current) use of anticoagulants: Secondary | ICD-10-CM | POA: Diagnosis not present

## 2018-03-06 DIAGNOSIS — S32301D Unspecified fracture of right ilium, subsequent encounter for fracture with routine healing: Secondary | ICD-10-CM | POA: Diagnosis not present

## 2018-03-06 DIAGNOSIS — I951 Orthostatic hypotension: Secondary | ICD-10-CM | POA: Diagnosis present

## 2018-03-06 DIAGNOSIS — M6281 Muscle weakness (generalized): Secondary | ICD-10-CM | POA: Diagnosis not present

## 2018-03-06 DIAGNOSIS — Z7401 Bed confinement status: Secondary | ICD-10-CM | POA: Diagnosis not present

## 2018-03-06 DIAGNOSIS — I4891 Unspecified atrial fibrillation: Secondary | ICD-10-CM | POA: Diagnosis not present

## 2018-03-06 DIAGNOSIS — Z9842 Cataract extraction status, left eye: Secondary | ICD-10-CM | POA: Diagnosis not present

## 2018-03-06 DIAGNOSIS — I7300 Raynaud's syndrome without gangrene: Secondary | ICD-10-CM | POA: Diagnosis not present

## 2018-03-06 DIAGNOSIS — W19XXXA Unspecified fall, initial encounter: Secondary | ICD-10-CM | POA: Diagnosis not present

## 2018-03-06 DIAGNOSIS — R41841 Cognitive communication deficit: Secondary | ICD-10-CM | POA: Diagnosis not present

## 2018-03-06 DIAGNOSIS — Z951 Presence of aortocoronary bypass graft: Secondary | ICD-10-CM | POA: Diagnosis not present

## 2018-03-06 DIAGNOSIS — Z823 Family history of stroke: Secondary | ICD-10-CM | POA: Diagnosis not present

## 2018-03-06 LAB — GLUCOSE, CAPILLARY
Glucose-Capillary: 156 mg/dL — ABNORMAL HIGH (ref 70–99)
Glucose-Capillary: 174 mg/dL — ABNORMAL HIGH (ref 70–99)
Glucose-Capillary: 161 mg/dL — ABNORMAL HIGH (ref 70–99)
Glucose-Capillary: 161 mg/dL — ABNORMAL HIGH (ref 70–99)

## 2018-03-06 LAB — MAGNESIUM: Magnesium: 1.9 mg/dL (ref 1.7–2.4)

## 2018-03-06 MED ORDER — SENNOSIDES-DOCUSATE SODIUM 8.6-50 MG PO TABS
1.0000 | ORAL_TABLET | Freq: Two times a day (BID) | ORAL | Status: DC
  Administered 2018-03-06 – 2018-03-07 (×3): 1 via ORAL
  Filled 2018-03-06 (×3): qty 1

## 2018-03-06 MED ORDER — POLYETHYLENE GLYCOL 3350 17 G PO PACK
17.0000 g | PACK | Freq: Every day | ORAL | Status: DC
  Administered 2018-03-06 – 2018-03-07 (×2): 17 g via ORAL
  Filled 2018-03-06 (×2): qty 1

## 2018-03-06 MED ORDER — METOPROLOL SUCCINATE ER 50 MG PO TB24
50.0000 mg | ORAL_TABLET | Freq: Every day | ORAL | Status: DC
  Administered 2018-03-06 – 2018-03-07 (×2): 50 mg via ORAL
  Filled 2018-03-06 (×2): qty 1

## 2018-03-06 MED ORDER — MAGNESIUM SULFATE 2 GM/50ML IV SOLN
2.0000 g | Freq: Once | INTRAVENOUS | Status: AC
  Administered 2018-03-06: 2 g via INTRAVENOUS
  Filled 2018-03-06: qty 50

## 2018-03-06 NOTE — Clinical Social Work Note (Signed)
Patient is approved for [facility] Medicare waiver to [facility]: [phone]. Plan for discharge tomorrow.   [name], [place]

## 2018-03-06 NOTE — Progress Notes (Signed)
Occupational Therapy Treatment Patient Details Name: [name] MRN: [id number] DOB: February 06, 1931 Today's Date: 03/06/2018    History of present illness Patient is an 85 yo male who presents after a syncopal episode with traumatic Right iliac wing fx.    OT comments  Pt continues to need min assist for mobility and ADL. He is agreeable to ST rehab in SNF.   Follow Up Recommendations  SNF;Supervision/Assistance - 24 hour    Equipment Recommendations  None recommended by OT    Recommendations for Other Services      Precautions / Restrictions Precautions Precautions: Fall Restrictions Weight Bearing Restrictions: No       Mobility Bed Mobility               General bed mobility comments: pt received in chair  Transfers Overall transfer level: Needs assistance Equipment used: Rolling walker (2 wheeled) Transfers: Sit to/from Stand Sit to Stand: Min assist         General transfer comment: steadying assist, cues for hand placement    Balance Overall balance assessment: Needs assistance   Sitting balance-Leahy Scale: Good       Standing balance-Leahy Scale: Poor Standing balance comment: reliant on at least one hand on walker or sink                           ADL either performed or assessed with clinical judgement   ADL Overall ADL's : Needs assistance/impaired     Grooming: Min guard;Standing;Oral care   Upper Body Bathing: Minimal assistance;Sitting Upper Body Bathing Details (indicate cue type and reason): assisted for back Lower Body Bathing: Minimal assistance;Sit to/from stand   Upper Body Dressing : Set up;Sitting   Lower Body Dressing: Minimal assistance;Sit to/from stand Lower Body Dressing Details (indicate cue type and reason): to don underwear     Toileting- Clothing Manipulation and Hygiene: Minimal assistance;Sit to/from stand Toileting - Clothing Manipulation Details (indicate cue type and reason): steadying  assist     Functional mobility during ADLs: Minimal assistance;+2 for safety/equipment;Rolling walker(pt with dizziness)       Vision       Perception     Praxis      Cognition Arousal/Alertness: Awake/alert Behavior During Therapy: WFL for tasks assessed/performed Overall Cognitive Status: Within Functional Limits for tasks assessed                                          Exercises     Shoulder Instructions       General Comments      Pertinent Vitals/ Pain       Pain Assessment: Faces Faces Pain Scale: Hurts even more Pain Location: RLE Pain Descriptors / Indicators: Grimacing;Guarding Pain Intervention(s): Monitored during session;Repositioned  Home Living                                          Prior Functioning/Environment              Frequency  Min 3X/week        Progress Toward Goals  OT Goals(current goals can now be found in the care plan section)  Progress towards OT goals: Progressing toward goals  Acute Rehab OT Goals Patient Stated Goal: to  go home, but agreeable to post acute rehab in snf OT Goal Formulation: With patient Time For Goal Achievement: 03/19/18 Potential to Achieve Goals: Good  Plan Discharge plan needs to be updated    Co-evaluation    PT/OT/SLP Co-Evaluation/Treatment: Yes Reason for Co-Treatment: For patient/therapist safety          AM-PAC OT "6 Clicks" Daily Activity     Outcome Measure   Help from another person eating meals?: None Help from another person taking care of personal grooming?: A Little Help from another person toileting, which includes using toliet, bedpan, or urinal?: A Little Help from another person bathing (including washing, rinsing, drying)?: A Little Help from another person to put on and taking off regular upper body clothing?: None Help from another person to put on and taking off regular lower body clothing?: A Little 6 Click Score: 20     End of Session Equipment Utilized During Treatment: Gait belt;Rolling walker  OT Visit Diagnosis: Unsteadiness on feet (R26.81);Other abnormalities of gait and mobility (R26.89);Muscle weakness (generalized) (M62.81);Pain Pain - Right/Left: Right Pain - part of body: Hip   Activity Tolerance Patient limited by pain   Patient Left in chair;with call bell/phone within reach   Nurse Communication Mobility status        Time: 1110-1147 OT Time Calculation (min): 37 min  Charges: OT General Charges $OT Visit: 1 Visit OT Treatments $Self Care/Home Management : 8-22 mins  Nestor Lewandowsky, OTR/L Acute Rehabilitation Services Pager: [phone] Office: [phone]   Malka So 03/06/2018, 11:55 AM

## 2018-03-06 NOTE — Progress Notes (Signed)
Physical Therapy Treatment Patient Details Name: [name] MRN: [id number] DOB: January 09, 1932 Today's Date: 03/06/2018    History of Present Illness Patient is an 85 yo male who presents after a syncopal episode with traumatic Right iliac wing fx.     PT Comments    Patient seen in conjunction with OT for dove tailed session. Patient continues to have difficulty managing tasks and mobility without physical assist. Patient limited to short distance ambulation, 16 steps total with multiple rest breaks due to pain and dizziness. BP stable 120/61 in standing. HR elevated to 126 with monitor report of unsustained vtac. Spoke with patient at length regarding concerns for safety and fall risk. Continue to feel that patient requires post acute rehabilitation. Current POC remains appropriate.  Follow Up Recommendations  SNF;Supervision for mobility/OOB     Equipment Recommendations  Rolling walker with 5" wheels    Recommendations for Other Services       Precautions / Restrictions Precautions Precautions: Fall    Mobility  Bed Mobility               General bed mobility comments: pt received in chair  Transfers Overall transfer level: Needs assistance Equipment used: Rolling walker (2 wheeled) Transfers: Sit to/from Stand Sit to Stand: Min assist         General transfer comment: steadying assist, cues for hand placement  Ambulation/Gait Ambulation/Gait assistance: Min assist Gait Distance (Feet): 18 Feet Assistive device: Rolling walker (2 wheeled) Gait Pattern/deviations: Antalgic;Step-to pattern;Decreased stride length;Trunk flexed Gait velocity: decreased Gait velocity interpretation: <1.31 ft/sec, indicative of household ambulator General Gait Details: VCs for sequencing with use of RW. Significant pain limiting mobility. Required 3 standing rest breaks with activity. Additionally, patient endorsing dizziness with movement and HR elevating to 126 with  unsustained Museum/gallery conservator    Modified Rankin (Stroke Patients Only)       Balance Overall balance assessment: Needs assistance   Sitting balance-Leahy Scale: Good       Standing balance-Leahy Scale: Poor Standing balance comment: reliant on at least one hand on walker or sink                            Cognition Arousal/Alertness: Awake/alert Behavior During Therapy: WFL for tasks assessed/performed Overall Cognitive Status: Within Functional Limits for tasks assessed                                        Exercises      General Comments        Pertinent Vitals/Pain Pain Assessment: Faces Faces Pain Scale: Hurts even more Pain Location: RLE Pain Descriptors / Indicators: Grimacing;Guarding Pain Intervention(s): Monitored during session    Home Living                      Prior Function            PT Goals (current goals can now be found in the care plan section) Acute Rehab PT Goals Patient Stated Goal: to go home, but agreeable to post acute rehab in snf PT Goal Formulation: With patient Time For Goal Achievement: 03/19/18 Potential to Achieve Goals: Good Progress towards PT goals: Progressing toward goals    Frequency    Min 5X/week  PT Plan Current plan remains appropriate    Co-evaluation PT/OT/SLP Co-Evaluation/Treatment: Yes(dove tailed) Reason for Co-Treatment: For patient/therapist safety PT goals addressed during session: Mobility/safety with mobility OT goals addressed during session: ADL's and self-care      AM-PAC PT "6 Clicks" Mobility   Outcome Measure  Help needed turning from your back to your side while in a flat bed without using bedrails?: None Help needed moving from lying on your back to sitting on the side of a flat bed without using bedrails?: A Little Help needed moving to and from a bed to a chair (including a wheelchair)?: A  Little Help needed standing up from a chair using your arms (e.g., wheelchair or bedside chair)?: A Little Help needed to walk in hospital room?: A Little Help needed climbing 3-5 steps with a railing? : A Lot 6 Click Score: 18    End of Session Equipment Utilized During Treatment: Gait belt Activity Tolerance: Patient limited by pain Patient left: in chair;with call bell/phone within reach Nurse Communication: Mobility status PT Visit Diagnosis: Unsteadiness on feet (R26.81);Difficulty in walking, not elsewhere classified (R26.2);Pain Pain - Right/Left: Right Pain - part of body: Hip     Time: 1110-1135 PT Time Calculation (min) (ACUTE ONLY): 25 min  Charges:  $Gait Training: 8-22 mins                     Alben Deeds, PT DPT  Board Certified Neurologic Specialist [place] Pager [phone] [place] 03/06/2018, 12:44 PM

## 2018-03-06 NOTE — Progress Notes (Addendum)
Presentation Summary:  [name] is a 85 y.o. male with medical history significant of orthostatic hypotension, coronary artery disease status post AICD/pacemaker, history of ventricular tachycardia comes in with a sudden syncopal episode while watching the Super Bowl.  Patient was watching Super Bowl tonight he got up to go to the kitchen to get him another couple coffee when he suddenly passed out.  Patient had no prodrome.  He does not really remember anything happening before next thing he knew he woke up on the floor coffee was splattered all over.  He had a difficult time getting up and his wife was sleeping in bed.  He did not want a call 911 because the door was locked he was scared they would break down the door.  So he called his neighbor who came to his assistance.  Patient is having difficulty bearing weight to his right hip.  Other than that he has no injuries.  He reports that the syncopal episode literally was less than a minute and he woke up pretty immediately normal except for the pelvis hip pain.   -Subsequently admitted for evaluation of syncope and right iliac crest fracture       Hospital Problems/ Course:   Syncope:  -History of orthostatic hypotension, did not meet orthostatic criteria in the ED -Yesterday systolic blood pressure dropped from 98 to 87 upon standing, but was asymptomatic then -Episode on admission was not associated with prodrome, prior history of near syncope a month ago was associated with lightheadedness/blurry vision which resolved on sitting down c/w orthostatic hypotension then -History of VT, ischemic cardiomyopathy, AICD -ED report of pacemaker interrogation which only noted atrial arrhythmias, no VT, patient denies having had any pacemaker interrogation this time around -QTC is mildly prolonged at 64 -Was also recently started on losartan prior to admission which patient is yet to start, will hold off on this with relatively soft BPs this  admission - ECHO showed no new abnormalities, same as last echo w/ EF 40-45% and inferior/ apical akinesis -Given extensive cardiac history, lack of prodrome with current presentation, will ask cardiology to evaluate, followed closely by Dr. Caryl Comes    R Pelvic fracture: due to fall. Pt broke off the upper rim of the R anterior iliac crest. See xrays.  - Orthopedic surgery consulted, advising weightbearing as tolerates, nonsurgical Rx.  -Declined by CIR, now considering home health less likely SNF   Chronic systolic heart failure:  -Followed by Dr Burt Knack and Dr Olin Pia -Recently started on daily p.o. 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)-stable    Ventricular tachycardia, polymorphic (HCC)-noted has AICD    Asthma-COPD overlap syndrome (HCC)-stable at this time   DVT prophylaxis: Eliquis Code Status: Full Family Communication: No family at bedside Disposition Plan:  Possibly home with home health services pending cardiology evaluation Consults called:  Orthopedics, cardiology, CIR   Subjective: Abdomen is uncomfortable, feels he needs a laxative to have a bowel movement Denies any chest pain shortness of breath dizziness  Vitals:   03/06/18 0429 03/06/18 0535 03/06/18 0723 03/06/18 0758  BP: 118/70  126/77  Pulse:   84   Resp: 18  (!) 21   Temp: 98.5 F (36.9 C)  98.4 F (36.9 C)   TempSrc: Oral  Oral   SpO2: 92%  99% 97%  Weight:  95.6 kg    Height:        Inpatient medications: . apixaban  5 mg Oral BID  . atorvastatin  80 mg Oral q1800  .  dofetilide  500 mcg Oral BID  . insulin aspart  0-5 Units Subcutaneous QHS  . insulin aspart  0-9 Units Subcutaneous TID WC  . sodium chloride flush  3 mL Intravenous Q12H  . sodium chloride flush  3 mL Intravenous Q12H  . sodium zirconium cyclosilicate  10 g Oral Daily  . umeclidinium-vilanterol  1 puff Inhalation Daily   . sodium chloride     sodium chloride, acetaminophen **OR** acetaminophen, HYDROcodone-acetaminophen, sodium chloride flush  Exam: Gen: Awake, Alert, Oriented X 3, distress HEENT: PERRLA, Neck supple, no JVD Lungs: Clear bilaterally CVS: S1-S2/regular rate rhythm Abd: soft, Non tender, non distended, BS present Extremities: No edema Skin: no new rashes Psychiatric: Normal judgment and insight. Alert and oriented x 3. Normal mood.         [name]  MD [facility]  03/06/2018, 10:10 AM   Recent Labs  Lab 03/04/18 0030 03/04/18 0834 03/05/18 0248  NA 133* 133* 132*  K 4.3 5.9* 4.0  CL 99 102 99  CO2 24 23 24   GLUCOSE 127* 147* 181*  BUN 11 13 10   CREATININE 0.93 0.95 0.81  CALCIUM 8.5* 7.9* 8.1*   No results for input(s): AST, ALT, ALKPHOS, BILITOT, PROT, ALBUMIN in the last 168 hours. Recent Labs  Lab 03/04/18 0030 03/05/18 0248  WBC 8.2 4.9  HGB 11.8* 11.0*  HCT 34.5* 32.7*  MCV 99.1 97.3  PLT 114* 94*   Iron/TIBC/Ferritin/ %Sat No results found for: IRON, TIBC, FERRITIN, IRONPCTSAT

## 2018-03-06 NOTE — Consult Note (Addendum)
CARDIOLOGY CONSULT NOTE    Patient ID: TAO SATZ; [id number]; 1931/09/21   Admit date: 03/03/2018 Date of Consult: 03/06/2018  Primary Care Provider: [name], MD Primary Cardiologist: Sherren Mocha, MD Primary Electrophysiologist:  [name], MD  Patient Profile:   [name] is a 85 y.o. male with a hx of CAD s/p CABG and AICD placement after episodes of VT who is being seen today for the evaluation of syncope.  History of Present Illness:   Mr. Pelissier presented to the ED on 03/03/2018 after experiencing a syncopal episode. Patient was watching the Super Bowl when he decided to get up to get a cup of coffee. Upon standing up within 10 seconds he subsequently lost consciousness and woke up on the floor. He denies any prodromal symptoms including vision changes, palpitations, shortness of breath, diaphoresis. He states that he has had an episode similar to this several years ago and at that time was told that he was orthostatic. He subsequently called his neighbor who called EMS and was brought to the hospital.  Over the past year the patient endorses intermittent dizziness and blurry vision. These episodes tend to occur with exertion. He states that he has been to see Dr. Caryl Comes who was referred him to a neurologist and pulmonologist for further evaluation. He has had his pacemaker and defibrillator interrogated on several occasions and been told that he is spending more time in atrial fibrillation. He was recently started on losartan and furosemide; however, he did not take these medications on or prior to this event.  Past Medical History:  Diagnosis Date  . AICD (automatic cardioverter/defibrillator) present    [employer]n PPM/ICD  . Coronary artery disease    [facility]  . Diabetes mellitus   . Dysrhythmia   . Endocarditis, valve unspecified    Mitral  . Headache    hx migraines 6-7 x mo  . History of migraine headaches   . History of  seasonal allergies   . ICD (implantable cardiac defibrillator) in place   . PAF (paroxysmal atrial fibrillation) ([place])    NO LAA occlusion at the time of surgery  [name] 2018  . Pleural effusion   . PVC's (premature ventricular contractions)   . Ventricular tachycardia, polymorphic ([place])    status post ICD implantation   Past Surgical History:  Procedure Laterality Date  . CARDIAC CATHETERIZATION  04/03/2007   EF 40%  . CARDIAC CATHETERIZATION  02/06/2006   EF 45%. ANTERIOR HYPOKINESIS  . CARDIAC CATHETERIZATION  05/29/2000   EF 50%. MILD ANTERIOR HYPOKINESIS  . CARDIAC CATHETERIZATION  10/25/1999   EF 50%. SEVERE MITRAL REGURGITATION  . CARDIAC CATHETERIZATION  01/06/2003   EF 50%  . CARDIAC DEFIBRILLATOR PLACEMENT    . CATARACT EXTRACTION W/ INTRAOCULAR LENS  IMPLANT, BILATERAL    . CORONARY ARTERY BYPASS GRAFT  2001   2 VESSEL CABG AND MITRAL VALVE REPAIR. HE HAD A LIMA GRAFT TO THE LAD AND RADIAL ARTERY  GRAFT TO THE OBTUSE MARGINAL  OF LEFT CIRCUMFLEX  . EP IMPLANTABLE DEVICE N/A 11/06/2014   Procedure: ICD Generator Changeout;  Surgeon: Deboraha Sprang, MD;  Location: [facility];  Service: Cardiovascular;  Laterality: N/A;  . FOREIGN BODY REMOVAL Right 06/21/2017   Procedure: FOREIGN BODY REMOVAL ADULT RIGHT RING FINGER;  Surgeon: Leanora Cover, MD;  Location: [facility];  Service: Orthopedics;  Laterality: Right;  . HEMORRHOID SURGERY    . HEMORROIDECTOMY    .  INTRAVASCULAR PRESSURE WIRE/FFR STUDY N/A 01/05/2017   Procedure: INTRAVASCULAR PRESSURE WIRE/FFR STUDY;  Surgeon: Sherren Mocha, MD;  Location: [place];  Service: Cardiovascular;  Laterality: N/A;  . KNEE ARTHROSCOPY     RIGHT KNEE  . LEFT HEART CATHETERIZATION WITH CORONARY ANGIOGRAM N/A 07/04/2012   Procedure: LEFT HEART CATHETERIZATION WITH CORONARY ANGIOGRAM;  Surgeon: Sherren Mocha, MD;  Location: [facility] CATH LAB;  Service: Cardiovascular;  Laterality: N/A;  . MITRAL VALVE REPLACEMENT     No LAA occlusion at  the time of surgery  Karie Soda report 2018  . RIGHT/LEFT HEART CATH AND CORONARY/GRAFT ANGIOGRAPHY N/A 01/05/2017   Procedure: RIGHT/LEFT HEART CATH AND CORONARY/GRAFT ANGIOGRAPHY;  Surgeon: Sherren Mocha, MD;  Location: [facility];  Service: Cardiovascular;  Laterality: N/A;  . TRANSTHORACIC ECHOCARDIOGRAM  04/02/2007   EF 35%    Home Medications:  Prior to Admission medications   Medication Sig Start Date End Date Taking? Authorizing Provider  atorvastatin (LIPITOR) 80 MG tablet Take 80 mg by mouth at bedtime.  07/12/10  Yes [provider]  Cholecalciferol (VITAMIN D-3) 5000 UNITS TABS Take 5,000-10,000 Units by mouth See admin instructions. Take 5000 units by mouth on Sunday, Tuesday, Thursday, and Saturday. Take 10000 units by mouth on Monday, Wednesday, and Friday   Yes [provider]  dofetilide ([name]) 500 MCG capsule Take 1 capsule (500 mcg total) by mouth 2 (two) times daily. 02/26/18  Yes Deboraha Sprang, MD  ELIQUIS 5 MG TABS tablet TAKE ONE TABLET BY MOUTH TWICE DAILY Patient taking differently: Take 5 mg by mouth 2 (two) times daily.  02/18/18  Yes Deboraha Sprang, MD  EPIPEN 2-PAK 0.3 MG/0.3ML SOAJ injection Inject 0.3 mg into the muscle daily as needed for anaphylaxis. USE ONLY IN EMERGENCY 08/12/12  Yes [provider]  fluticasone (FLONASE) 50 MCG/ACT nasal spray Place 1 spray into the nose daily as needed for rhinitis or allergies.   Yes [provider]  furosemide (LASIX) 20 MG tablet Take 1 tablet (20 mg total) by mouth daily. Patient taking differently: Take 20 mg by mouth daily as needed for fluid.  12/18/17  Yes Deboraha Sprang, MD  ibuprofen (ADVIL,MOTRIN) 200 MG tablet Take 400 mg by mouth every 8 (eight) hours as needed for headache or moderate pain.    Yes [provider]  Magnesium 250 MG TABS Take 250 mg by mouth daily.    Yes [provider]  Multiple Vitamin (MULTIVITAMIN WITH MINERALS) TABS Take 1 tablet  by mouth daily. Centrum Silver   Yes [provider]  Multiple Vitamins-Minerals (PRESERVISION AREDS 2) CAPS Take 1 capsule by mouth 2 (two) times daily.    Yes [provider]  nateglinide (STARLIX) 120 MG tablet Take 120 mg by mouth Twice daily.  07/12/10  Yes [provider]  pioglitazone (ACTOS) 45 MG tablet Take 45 mg by mouth daily.     Yes [provider]  Propylhexedrine Darcella Gasman) INHA Place 1 each into the nose daily as needed (for congestion).   Yes [provider]  SF 5000 PLUS 1.1 % CREA dental cream Place 1 application onto teeth at bedtime.  06/05/17  Yes [provider]  sitaGLIPtin (JANUVIA) 100 MG tablet Take 100 mg by mouth at bedtime.    Yes [provider]  umeclidinium-vilanterol (ANORO ELLIPTA) 62.5-25 MCG/INH AEPB Inhale 1 puff into the lungs daily. 01/15/18  Yes Chesley Mires, MD  losartan (COZAAR) 25 MG tablet Take 1 tablet (  25 mg total) by mouth daily. Patient not taking: Reported on 03/04/2018 02/20/18 05/21/18  Deboraha Sprang, MD  metoprolol succinate (TOPROL-XL) 50 MG 24 hr tablet TAKE ONE TABLET BY MOUTH DAILY IMMEDIATELY FOLLOWING A MEAL 03/04/18   Deboraha Sprang, MD  Tiotropium Bromide-Olodaterol (STIOLTO RESPIMAT) 2.5-2.5 MCG/ACT AERS Inhale 2 puffs into the lungs daily. Patient not taking: Reported on 03/04/2018 02/22/18   Lauraine Rinne, NP   Inpatient Medications: Scheduled Meds: . apixaban  5 mg Oral BID  . atorvastatin  80 mg Oral q1800  . dofetilide  500 mcg Oral BID  . insulin aspart  0-5 Units Subcutaneous QHS  . insulin aspart  0-9 Units Subcutaneous TID WC  . metoprolol succinate  50 mg Oral Daily  . polyethylene glycol  17 g Oral Daily  . senna-docusate  1 tablet Oral BID  . sodium chloride flush  3 mL Intravenous Q12H  . sodium chloride flush  3 mL Intravenous Q12H  . sodium zirconium cyclosilicate  10 g Oral Daily  . umeclidinium-vilanterol  1 puff Inhalation Daily   Continuous  Infusions: . sodium chloride    . magnesium sulfate 1 - 4 g bolus IVPB 2 g (03/06/18 1214)   PRN Meds: sodium chloride, acetaminophen **OR** acetaminophen, HYDROcodone-acetaminophen, sodium chloride flush  Allergies:    Allergies  Allergen Reactions  . Latex Other (See Comments)    REDNESS AT REACTION SITE.  Marland Kitchen Metformin Diarrhea  . Rivaroxaban Other (See Comments)    Headaches, blurred vision  . Other Other (See Comments)    BEE STINGS   . Sotalol Other (See Comments)    "BLUE TOES"- cut his circulation off  . Warfarin Sodium Other (See Comments)    REACTION: migraine headaches/vision impariment   Social History:   Social History   Socioeconomic History  . Marital status: Married    Spouse name: Mardene Celeste  . Number of children: 2  . Years of education: Not on file  . Highest education level: Associate degree: academic program  Occupational History  . Occupation: retired  Scientific laboratory technician  . Financial resource strain: Not on file  . Food insecurity:    Worry: Not on file    Inability: Not on file  . Transportation needs:    Medical: Not on file    Non-medical: Not on file  Tobacco Use  . Smoking status: Former Smoker    Packs/day: 1.00    Years: 5.00    Pack years: 5.00    Types: Cigarettes    Start date: 02/23/1959    Last attempt to quit: 01/31/1964    Years since quitting: 54.1  . Smokeless tobacco: Never Used  Substance and Sexual Activity  . Alcohol use: Yes    Alcohol/week: 0.0 standard drinks    Comment: 1-2 a week  . Drug use: No  . Sexual activity: Yes  Lifestyle  . Physical activity:    Days per week: Not on file    Minutes per session: Not on file  . Stress: Not on file  Relationships  . Social connections:    Talks on phone: Not on file    Gets together: Not on file    Attends religious service: Not on file    Active member of club or organization: Not on file    Attends meetings of clubs or organizations: Not on file    Relationship status:  Not on file  . Intimate partner violence:    Fear of current or ex  partner: Not on file    Emotionally abused: Not on file    Physically abused: Not on file    Forced sexual activity: Not on file  Other Topics Concern  . Not on file  Social History Narrative   Patient is right-handed. He lives with his wife ina 2 story home. He drinks one cup of coffee and a diet coke a day.     Family History:   Family History  Problem Relation Age of Onset  . Heart disease Father   . Emphysema Father   . Stroke Mother   . Cancer Sister        breast cancer  . Heart disease Paternal Grandmother   . Heart disease Paternal Grandfather   . Diabetes Other        grandmother, aunt, uncle  . Cancer Other        lung cancer, aunt    ROS:  Performed and all others negative.  Physical Exam/Data:   Vitals:   03/06/18 0758 03/06/18 1054 03/06/18 1159 03/06/18 1217  BP:   121/64 123/71  Pulse:   90 88  Resp:   (!) 25   Temp:   98.1 F (36.7 C)   TempSrc:   Oral   SpO2: 97% 94% 93%   Weight:      Height:        Intake/Output Summary (Last 24 hours) at 03/06/2018 1311 Last data filed at 03/06/2018 1100 Gross per 24 hour  Intake 123 ml  Output 1950 ml  Net -1827 ml   Filed Weights   03/04/18 0018 03/05/18 0359 03/06/18 0535  Weight: 90.7 kg 95.1 kg 95.6 kg   Body mass index is 27.81 kg/m.   General: Obese male in no acute distress HENT: Normocephalic, atraumatic, moist mucus membranes Pulm: Good air movement with no wheezing or crackles  CV: RRR, no murmurs, no rubs  Abdomen: Active bowel sounds, soft, non-distended, no tenderness to palpation  Extremities: Pulses palpable in all extremities, no LE edema  Skin: Warm and dry  Neuro: Alert and oriented x 3  EKG:  The EKG was personally reviewed and demonstrates: Wide complex sinus rhythm. No pace spikes noted.   Telemetry:  Telemetry was personally reviewed and demonstrates: Rate controlled. Pacer spikes noted.   Relevant CV  Studies:  TTE 03/04/2018   1. The left ventricle has mild-moderately reduced systolic function of 59-16%. The cavity size is normal. There is mild concentric left ventricular wall thickness. Echo evidence of pseudonormal diastolic filling patterns. Elevated mean left atrial  pressure. There is abnormal septal motion consistent with RV pacing or LBBB. The apex is severely hypokinetic.  2. The right ventricle is normal in size. There is normal systolic function. Right ventricular systolic pressure is mildly elevated with an estimated pressure of 41.5 mmHg.  3. Severely dilated left atrial size.  4. Mildly dilated right atrial size.  5. Mild-to-moderate mitral valve stenosis.  6. Mild-moderate tricuspid regurgitation.  7. The ascending aorta and aortic root are normal is size and structure.  8. The inferior vena cava was dilated in size with >50% respiratory variablity.  9. No atrial level shunt detected by color flow Doppler.  Laboratory Data:  Chemistry Recent Labs  Lab 03/04/18 0030 03/04/18 0834 03/05/18 0248  NA 133* 133* 132*  K 4.3 5.9* 4.0  CL 99 102 99  CO2 24 23 24   GLUCOSE 127* 147* 181*  BUN 11 13 10   CREATININE 0.93 0.95 0.81  CALCIUM 8.5*  7.9* 8.1*  GFRNONAA >60 >60 >60  GFRAA >60 >60 >60  ANIONGAP 10 8 9     No results for input(s): PROT, ALBUMIN, AST, ALT, ALKPHOS, BILITOT in the last 168 hours. Hematology Recent Labs  Lab 03/04/18 0030 03/05/18 0248  WBC 8.2 4.9  RBC 3.48* 3.36*  HGB 11.8* 11.0*  HCT 34.5* 32.7*  MCV 99.1 97.3  MCH 33.9 32.7  MCHC 34.2 33.6  RDW 13.2 13.2  PLT 114* 94*   Cardiac Enzymes Recent Labs  Lab 03/04/18 0322 03/04/18 0834 03/04/18 1500  TROPONINI <0.03 <0.03 <0.03    Recent Labs  Lab 03/04/18 0041  TROPIPOC 0.01    BNP Recent Labs  Lab 03/04/18 0030  BNP 219.8*    DDimer No results for input(s): DDIMER in the last 168 hours.  Radiology/Studies:  Dg Chest 2 View  Result Date: 03/04/2018 CLINICAL DATA:   Patient fell tonight. EXAM: CHEST - 2 VIEW COMPARISON:  12/18/2017 FINDINGS: Postoperative changes in the mediastinum. Cardiac pacemaker. Cardiac enlargement. No vascular congestion, edema, or consolidation. No blunting of costophrenic angles. No pneumothorax. Mediastinal contours appear intact. IMPRESSION: Cardiac enlargement. No evidence of active pulmonary disease. Electronically Signed   By: Lucienne Capers M.D.   On: 03/04/2018 01:04   Dg Elbow Complete Right  Result Date: 03/04/2018 CLINICAL DATA:  Pain and laceration after a fall. EXAM: RIGHT ELBOW - COMPLETE 3+ VIEW COMPARISON:  None. FINDINGS: Right elbow appears intact. No evidence of acute fracture or subluxation. No focal bone lesion or bone destruction. Bone cortex and trabecular architecture appear intact. No significant effusion. No radiopaque soft tissue foreign bodies. Surgical clips in the soft tissues. IMPRESSION: 1. No acute bony abnormalities. 2. No radiopaque foreign body. Electronically Signed   By: Lucienne Capers M.D.   On: 03/04/2018 01:05   Ct Head Wo Contrast  Result Date: 03/04/2018 CLINICAL DATA:  Head trauma, patient on anticoagulation EXAM: CT HEAD WITHOUT CONTRAST TECHNIQUE: Contiguous axial images were obtained from the base of the skull through the vertex without intravenous contrast. COMPARISON:  12/31/2017 FINDINGS: Brain: No evidence of acute infarction, hemorrhage, extra-axial collection, ventriculomegaly, or mass effect. Generalized cerebral atrophy. Periventricular white matter low attenuation likely secondary to microangiopathy. Vascular: Cerebrovascular atherosclerotic calcifications are noted. Skull: Negative for fracture or focal lesion. Sinuses/Orbits: Visualized portions of the orbits are unremarkable. Visualized portions of the paranasal sinuses are unremarkable. Visualized portions of the mastoid air cells are unremarkable. Other: None. IMPRESSION: No acute intracranial pathology. Electronically Signed   By:  Kathreen Devoid   On: 03/04/2018 01:14   Dg Hip Unilat  With Pelvis 2-3 Views Right  Result Date: 03/04/2018 CLINICAL DATA:  Right hip pain after a fall. EXAM: DG HIP (WITH OR WITHOUT PELVIS) 2-3V RIGHT COMPARISON:  None. FINDINGS: Comminuted and displaced acute fractures of the right anterior iliac bone. Right hip appears intact. Degenerative changes in the lower lumbar spine and both hips. SI joints and symphysis pubis are not displaced. Vascular calcifications. Soft tissue swelling over the right hip, likely contusion. IMPRESSION: Acute posttraumatic comminuted and displaced fractures of the anterior superior right iliac bone. Electronically Signed   By: Lucienne Capers M.D.   On: 03/04/2018 01:06   Assessment and Plan:   [name] is a 85 y.o. male with a hx of CAD s/p CABG and ACID placement after episodes of VT who is being seen today for the evaluation of syncope. The patient's extensive cardiac history we were asked for further evaluation.  Syncope -  No evidence of ischemia with negative troponins and initial EKG negative for Sgarbossa's criteria - Repeat echocardiogram negative for acute structural changes. Consistent with prior.  - Pace maker interrogated in ED. Patient has had 2 atrial arrhythmias since January 22, but the last one was on January 28, no arrhythmia on day of admission. No ventricular arrhythmias noted  No V. tach.  No firing of the defibrillator. He has been in atrial arrhythmia 45% of the time.  - Based on the patient's history and presentation his syncope is likely secondary to orthostatic hypotension. In the chart there is a note that he was orthostatic positive but the BP and HR are not documented.   Ischemic Cardiomyopathy  CAD s/p CABG - Appears euvolemic today without evidence of acute exacerbation  - Continue Atorvastatin 80 mg QD, Metoprolol succinate 50 mg QD, and PRN furosemide  - Patient would benefit from ACE/ARB therapy however, may be difficult with  his orthostatic hypotension. - Recommend discontinuation of Pioglitazone as this can worsen HF and increase risk for hospitalization   Atrial Fibrillation  - Sounds to be symptomatic during episodes of Atrial Fibrillation. Dr. Caryl Comes has referred the patient to pulmonology to for help controlling his pulmonary disease which appears to be decreasing his burden.  - Continue rate and rhythm control with metoprolol succinate 50 mg QD and Dofetilide 500 mcg BID - Continue Anticoagulation with Eliquis   For questions or updates, please contact [facility] Please consult [email or web address] for contact info under Cardiology/STEMI.   Signed, Ina Homes, MD 03/06/2018 1:11 PM  ---------------------------------------------------------------------------------------------   History and all data above reviewed.  Patient examined.  I agree with the findings as above.  JOHNCHARLES FUSSELMAN is feeling well currently, but recognizes he may have orthostatic hypotension.  Constitutional: No acute distress Cardiovascular: regular rhythm, normal rate, no murmurs. S1 and S2 normal. Radial pulses normal bilaterally. No jugular venous distention.  Respiratory: clear to auscultation bilaterally GI : normal bowel sounds, soft and nontender. No distention.   MSK: extremities warm, well perfused. No edema.  NEURO: grossly nonfocal exam, moves all extremities. PSYCH: alert and oriented x 3, normal mood and affect.   All available labs, radiology testing, previous records reviewed. Agree with documented assessment and plan of my colleague as stated above with the following additions or changes:  Principal Problem:   Syncope Active Problems:   ATRIAL FIBRILLATION   Automatic implantable cardioverter-defibrillator in situ   Coronary artery disease with exertional angina (HCC)   Ventricular tachycardia, polymorphic (HCC)   Asthma-COPD overlap syndrome ([place])   Pelvic fracture ([place])    Plan: His syncopal episode  sounds orthostatic in nature. We discussed his ICD interrogation performed today which documents controlled rates in atrial fibrillation, 88% pacing despite good underlying rhythm, and no sustained ventricular arrhythmias, and no events on the day of the syncopal episode.   We discussed conservative measures such as compression stockings, avoiding dehydration balanced with daily weights and lasix as needed or lower extremity edema. Would likely avoid starting losartan if not needed as an outpatient.   Will arrange close follow up with cardiology clinic in 2-4 weeks for review of medications and symptoms. Patient understands and agrees.   Length of Stay:  LOS: 0 days   Elouise Munroe, MD [facility] 3:13 PM  03/06/2018

## 2018-03-07 DIAGNOSIS — S32301D Unspecified fracture of right ilium, subsequent encounter for fracture with routine healing: Secondary | ICD-10-CM | POA: Diagnosis not present

## 2018-03-07 DIAGNOSIS — Z7901 Long term (current) use of anticoagulants: Secondary | ICD-10-CM | POA: Diagnosis not present

## 2018-03-07 DIAGNOSIS — S32301A Unspecified fracture of right ilium, initial encounter for closed fracture: Secondary | ICD-10-CM | POA: Diagnosis not present

## 2018-03-07 DIAGNOSIS — I25110 Atherosclerotic heart disease of native coronary artery with unstable angina pectoris: Secondary | ICD-10-CM | POA: Diagnosis not present

## 2018-03-07 DIAGNOSIS — I951 Orthostatic hypotension: Secondary | ICD-10-CM | POA: Diagnosis not present

## 2018-03-07 DIAGNOSIS — Z9181 History of falling: Secondary | ICD-10-CM | POA: Diagnosis not present

## 2018-03-07 DIAGNOSIS — M6281 Muscle weakness (generalized): Secondary | ICD-10-CM | POA: Diagnosis not present

## 2018-03-07 DIAGNOSIS — Z87891 Personal history of nicotine dependence: Secondary | ICD-10-CM | POA: Diagnosis not present

## 2018-03-07 DIAGNOSIS — I472 Ventricular tachycardia: Secondary | ICD-10-CM | POA: Diagnosis not present

## 2018-03-07 DIAGNOSIS — J309 Allergic rhinitis, unspecified: Secondary | ICD-10-CM | POA: Diagnosis not present

## 2018-03-07 DIAGNOSIS — S32501D Unspecified fracture of right pubis, subsequent encounter for fracture with routine healing: Secondary | ICD-10-CM | POA: Diagnosis not present

## 2018-03-07 DIAGNOSIS — Z95810 Presence of automatic (implantable) cardiac defibrillator: Secondary | ICD-10-CM | POA: Diagnosis not present

## 2018-03-07 DIAGNOSIS — R55 Syncope and collapse: Secondary | ICD-10-CM | POA: Diagnosis not present

## 2018-03-07 DIAGNOSIS — I7300 Raynaud's syndrome without gangrene: Secondary | ICD-10-CM | POA: Diagnosis not present

## 2018-03-07 DIAGNOSIS — R41841 Cognitive communication deficit: Secondary | ICD-10-CM | POA: Diagnosis not present

## 2018-03-07 DIAGNOSIS — J449 Chronic obstructive pulmonary disease, unspecified: Secondary | ICD-10-CM | POA: Diagnosis not present

## 2018-03-07 DIAGNOSIS — K5900 Constipation, unspecified: Secondary | ICD-10-CM | POA: Diagnosis not present

## 2018-03-07 DIAGNOSIS — I5022 Chronic systolic (congestive) heart failure: Secondary | ICD-10-CM | POA: Diagnosis not present

## 2018-03-07 DIAGNOSIS — I4891 Unspecified atrial fibrillation: Secondary | ICD-10-CM | POA: Diagnosis not present

## 2018-03-07 DIAGNOSIS — E119 Type 2 diabetes mellitus without complications: Secondary | ICD-10-CM | POA: Diagnosis not present

## 2018-03-07 DIAGNOSIS — R278 Other lack of coordination: Secondary | ICD-10-CM | POA: Diagnosis not present

## 2018-03-07 DIAGNOSIS — W19XXXA Unspecified fall, initial encounter: Secondary | ICD-10-CM | POA: Diagnosis not present

## 2018-03-07 DIAGNOSIS — S329XXD Fracture of unspecified parts of lumbosacral spine and pelvis, subsequent encounter for fracture with routine healing: Secondary | ICD-10-CM | POA: Diagnosis not present

## 2018-03-07 DIAGNOSIS — M2550 Pain in unspecified joint: Secondary | ICD-10-CM | POA: Diagnosis not present

## 2018-03-07 DIAGNOSIS — R531 Weakness: Secondary | ICD-10-CM | POA: Diagnosis not present

## 2018-03-07 DIAGNOSIS — Z7401 Bed confinement status: Secondary | ICD-10-CM | POA: Diagnosis not present

## 2018-03-07 LAB — CBC
HCT: 29.2 % — ABNORMAL LOW (ref 39.0–52.0)
Hemoglobin: 9.8 g/dL — ABNORMAL LOW (ref 13.0–17.0)
MCH: 32.6 pg (ref 26.0–34.0)
MCHC: 33.6 g/dL (ref 30.0–36.0)
MCV: 97 fL (ref 80.0–100.0)
Platelets: 95 10*3/uL — ABNORMAL LOW (ref 150–400)
RBC: 3.01 MIL/uL — ABNORMAL LOW (ref 4.22–5.81)
RDW: 13.1 % (ref 11.5–15.5)
WBC: 5.3 10*3/uL (ref 4.0–10.5)
nRBC: 0 % (ref 0.0–0.2)

## 2018-03-07 LAB — BASIC METABOLIC PANEL
Anion gap: 9 (ref 5–15)
BUN: 10 mg/dL (ref 8–23)
CO2: 26 mmol/L (ref 22–32)
Calcium: 8.4 mg/dL — ABNORMAL LOW (ref 8.9–10.3)
Chloride: 98 mmol/L (ref 98–111)
Creatinine, Ser: 0.88 mg/dL (ref 0.61–1.24)
GFR calc Af Amer: >60 mL/min (ref >60)
GFR calc non Af Amer: >60 mL/min (ref >60)
Glucose, Bld: 174 mg/dL — ABNORMAL HIGH (ref 70–99)
Potassium: 4.1 mmol/L (ref 3.5–5.1)
Sodium: 133 mmol/L — ABNORMAL LOW (ref 135–145)

## 2018-03-07 LAB — GLUCOSE, CAPILLARY
Glucose-Capillary: 245 mg/dL — ABNORMAL HIGH (ref 70–99)
Glucose-Capillary: 175 mg/dL — ABNORMAL HIGH (ref 70–99)

## 2018-03-07 MED ORDER — FUROSEMIDE 20 MG PO TABS: 20.0000 mg | ORAL_TABLET | Freq: Every day | ORAL | Status: DC | PRN

## 2018-03-07 MED ORDER — POLYETHYLENE GLYCOL 3350 17 G PO PACK: 17.0000 g | PACK | Freq: Every day | ORAL | 0 refills | Status: DC

## 2018-03-07 MED ORDER — HYDROCODONE-ACETAMINOPHEN 5-325 MG PO TABS: 1.0000 | ORAL_TABLET | Freq: Four times a day (QID) | ORAL | 0 refills | Status: DC | PRN

## 2018-03-07 MED ORDER — ACETAMINOPHEN 325 MG PO TABS: 650.0000 mg | ORAL_TABLET | Freq: Four times a day (QID) | ORAL | Status: DC | PRN

## 2018-03-07 NOTE — Progress Notes (Signed)
Pt bP running soft 103/50. RN will hold metoprolol and recheck bP. RN will continue to monitor.

## 2018-03-07 NOTE — Clinical Social Work Placement (Signed)
   CLINICAL SOCIAL WORK PLACEMENT  NOTE  Date:  03/07/2018  Patient Details  Name: [name] MRN: [id number] Date of Birth: 17-Apr-1931  Clinical Social Work is seeking post-discharge placement for this patient at the [place] level of care (*CSW will initial, date and re-position this form in  chart as items are completed):      Patient/family provided with [facility] Work Department's list of facilities offering this level of care within the geographic area requested by the patient (or if unable, by the patient's family).      Patient/family informed of their freedom to choose among providers that offer the needed level of care, that participate in Medicare, Medicaid or managed care program needed by the patient, have an available bed and are willing to accept the patient.      Patient/family informed of [facility]'s ownership interest in [facility] and [facility], as well as of the fact that they are under no obligation to receive care at these facilities.  PASRR submitted to EDS on 03/05/18     PASRR number received on 03/05/18     Existing PASRR number confirmed on       FL2 transmitted to all facilities in geographic area requested by pt/family on 03/05/18     FL2 transmitted to all facilities within larger geographic area on       Patient informed that his/her managed care company has contracts with or will negotiate with certain facilities, including the following:        Yes   Patient/family informed of bed offers received.  Patient chooses bed at [facility]     Physician recommends and patient chooses bed at      Patient to be transferred to [facility] on 03/07/18.  Patient to be transferred to facility by [facility]     Patient family notified on 03/07/18 of transfer.  Name of family member notified:        PHYSICIAN Please prepare prescriptions     Additional Comment:     _______________________________________________ Candie Chroman, LCSW 03/07/2018, 12:14 PM

## 2018-03-07 NOTE — Progress Notes (Signed)
Paged provider inquiring about patient potential discharge for the day. Awaiting response.

## 2018-03-07 NOTE — Progress Notes (Addendum)
Physical Therapy Treatment Patient Details Name: [name] MRN: [id number] DOB: 1931-07-20 Today's Date: 03/07/2018    History of Present Illness Patient is an 85 yo male who presents after a syncopal episode with traumatic Right iliac wing fx.     PT Comments    Pt admitted with above diagnosis. Pt currently with functional limitations due to the deficits listed below (see PT Problem List). Pt was able to ambulate with RW with need for cues and min assist with chair follow.  Difficulty due to right LE pain.  Will continue to follow acutely.   Pt will benefit from skilled PT to increase their independence and safety with mobility to allow discharge to the venue listed below.     Follow Up Recommendations  SNF;Supervision for mobility/OOB     Equipment Recommendations  Rolling walker with 5" wheels    Recommendations for Other Services       Precautions / Restrictions Precautions Precautions: Fall Restrictions Weight Bearing Restrictions: No    Mobility  Bed Mobility Overal bed mobility: Needs Assistance Bed Mobility: Rolling;Supine to Sit Rolling: Min guard   Supine to sit: Min assist     General bed mobility comments: Assist for elevation of trunk and for LES.   Transfers Overall transfer level: Needs assistance Equipment used: Rolling walker (2 wheeled) Transfers: Sit to/from Stand Sit to Stand: Min assist         General transfer comment: steadying assist, cues for hand placement  Ambulation/Gait Ambulation/Gait assistance: Min assist;+2 safety/equipment Gait Distance (Feet): 40 Feet Assistive device: Rolling walker (2 wheeled) Gait Pattern/deviations: Antalgic;Step-to pattern;Decreased stride length;Trunk flexed Gait velocity: decreased Gait velocity interpretation: <1.31 ft/sec, indicative of household ambulator General Gait Details: Walked to sink and brushed teeth with min guard assist at sink.  VCs for sequencing with use of RW. Significant pain  limiting mobility. Required multiple standing rest breaks with activity. VSS today.    Stairs             Wheelchair Mobility    Modified Rankin (Stroke Patients Only)       Balance Overall balance assessment: Needs assistance Sitting-balance support: Feet supported;No upper extremity supported Sitting balance-Leahy Scale: Good     Standing balance support: Bilateral upper extremity supported;During functional activity Standing balance-Leahy Scale: Poor Standing balance comment: reliant on UE support.                             Cognition Arousal/Alertness: Awake/alert Behavior During Therapy: WFL for tasks assessed/performed Overall Cognitive Status: Within Functional Limits for tasks assessed                                        Exercises General Exercises - Lower Extremity Ankle Circles/Pumps: AROM;Both;10 reps;Seated Long Arc Quad: AROM;Both;10 reps;Seated    General Comments  Pre BP 103/50; post BP 97/72      Pertinent Vitals/Pain Pain Assessment: Faces Faces Pain Scale: Hurts even more Pain Location: RLE Pain Descriptors / Indicators: Grimacing;Guarding Pain Intervention(s): Limited activity within patient's tolerance;Monitored during session;Repositioned    Home Living                      Prior Function            PT Goals (current goals can now be found in the care plan section) Acute  Rehab PT Goals Patient Stated Goal: to go home, but agreeable to post acute rehab in snf Progress towards PT goals: Progressing toward goals    Frequency    Min 3X/week      PT Plan Current plan remains appropriate;Frequency needs to be updated    Co-evaluation              AM-PAC PT "6 Clicks" Mobility   Outcome Measure  Help needed turning from your back to your side while in a flat bed without using bedrails?: None Help needed moving from lying on your back to sitting on the side of a flat bed without  using bedrails?: A Little Help needed moving to and from a bed to a chair (including a wheelchair)?: A Little Help needed standing up from a chair using your arms (e.g., wheelchair or bedside chair)?: A Little Help needed to walk in hospital room?: A Little Help needed climbing 3-5 steps with a railing? : A Lot 6 Click Score: 18    End of Session Equipment Utilized During Treatment: Gait belt Activity Tolerance: Patient limited by pain;Patient limited by fatigue Patient left: in chair;with call bell/phone within reach;with chair alarm set Nurse Communication: Mobility status PT Visit Diagnosis: Unsteadiness on feet (R26.81);Difficulty in walking, not elsewhere classified (R26.2);Pain Pain - Right/Left: Right Pain - part of body: Hip     Time: 6237-6283 PT Time Calculation (min) (ACUTE ONLY): 18 min  Charges:  $Gait Training: 8-22 mins                     [place] Pager:  [phone]  Office:  [place] 03/07/2018, 12:16 PM

## 2018-03-07 NOTE — Progress Notes (Signed)
Report called Joy to [facility] given to . Will also send copy of AVS with [facility]. No further questions. Prescription sent to facility with [facility]. Pt VSS at discharge. IV removed per orders. Pt discharge to facility via [facility].

## 2018-03-07 NOTE — Discharge Summary (Signed)
Physician Discharge Summary  XAI FRERKING TZG:[id number] DOB: February 01, 1931 DOA: 03/03/2018  PCP: [name], MD  Admit date: 03/03/2018 Discharge date: 03/07/2018  Time spent: 35 minutes  Recommendations for Outpatient Follow-up:  1. Cardiology, [facility] on 2/26 at 10:30 AM 2. 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)   Ventricular tachycardia, polymorphic (HCC)   Asthma-COPD overlap syndrome ([place])   Pelvic fracture (HCC)   Closed fracture of right iliac crest [facility])   Discharge Condition: Stable  Diet recommendation: Low-sodium, heart healthy, diabetic  Filed Weights   03/04/18 0018 03/05/18 0359 03/06/18 0535  Weight: 90.7 kg 95.1 kg 95.6 kg    History of present illness:  [name] a 85 y.o.malewith medical history significant oforthostatic hypotension, coronary artery disease status post AICD/pacemaker, history of ventricular tachycardia comes in with a sudden syncopal episode while watching the [employer]. Patient was watching [employer] Bowl tonight he got up to go to the kitchen to get him another couple coffee when he suddenly passed out. Patient had no prodrome. He does not really remember anything happening before next thing he knew he woke up on the floor coffee was splattered all over  Hospital Course:   Syncope:  -History of orthostatic hypotension, systolic blood pressure dropped from 98 to 87 upon standing after admission -Episode on admission was not associated with prodrome -History of VT, ischemic cardiomyopathy, AICD -ED report of pacemaker interrogation which only noted atrial arrhythmias, no VT -QTC is mildly prolonged at 20 -Was also recently started on losartan prior to admission which patient is yet to start, will hold off on this with relatively soft  BPs this admission - ECHO showed no new abnormalities, same as last echo w/ EF 40-45% and inferior/ apical akinesis -Given extensive cardiac history, lack of prodrome with current presentation,  I requested a cardiology consultation , was seen by Dr. Margaretann Loveless yesterday that his episode was possibly orthostatic, no further work-up felt to be indicated since ICD interrogation did not reveal sustained ventricular tachycardia, recommended compression stockings, avoiding dehydration, avoiding losartan and using Lasix only as needed for edema. -Due to pelvic fractures sustained as a result of the fall he will be going to SNF for short-term rehab -Cardiology follow-up arranged for 2/26  R Pelvic fracture: due to fall. Pt broke off the upper rim of the R anterior iliac crest. See xrays.  - Orthopedic surgery consulted, advising weightbearing as tolerates, nonsurgical Rx.  -SNF for rehab   Chronic systolic heart failure:  -Followed by Dr Burt Knack and Dr Olin Pia -Recently started on daily p.o. 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   Metformin Diarrhea   Rivaroxaban Other (See Comments)   Headaches, blurred vision   Other Other (See Comments)   BEE STINGS   Sotalol Other (See Comments)   "BLUE TOES"- cut his circulation off   Warfarin Sodium Other (See Comments)   REACTION: migraine headaches/vision impariment      Medication List    STOP taking these medications   losartan 25 MG tablet Commonly known as:  COZAAR   Tiotropium Bromide-Olodaterol 2.5-2.5 MCG/ACT Aers Commonly known as:  STIOLTO RESPIMAT     TAKE these medications   acetaminophen 325 MG tablet Commonly known as:  TYLENOL Take 2 tablets (650 mg total) by mouth every 6 (six) hours as needed for mild pain (or Fever >/= 101).   atorvastatin 80 MG tablet Commonly known as:  LIPITOR Take 80 mg by mouth at bedtime.   BENZEDREX Inha Generic drug:  Propylhexedrine Place 1 each into the nose daily as needed (for congestion).   dofetilide 500 MCG capsule Commonly known as:  [name] Take 1 capsule (500 mcg total) by mouth 2 (two) times daily.   ELIQUIS 5 MG Tabs tablet Generic drug:  apixaban TAKE ONE TABLET BY MOUTH TWICE DAILY What changed:  how much to take   EPIPEN 2-PAK 0.3 mg/0.3 mL Soaj injection Generic drug:  EPINEPHrine Inject 0.3 mg into the muscle daily as needed for anaphylaxis. USE ONLY IN EMERGENCY   fluticasone 50 MCG/ACT nasal spray Commonly known as:  FLONASE Place 1 spray into the nose daily as needed for rhinitis or allergies.   furosemide 20 MG tablet Commonly known as:  LASIX Take 1 tablet (20 mg total) by mouth daily as needed for fluid or edema. What changed:    when to take this  reasons to take this   HYDROcodone-acetaminophen 5-325 MG tablet Commonly known as:  NORCO/VICODIN Take 1 tablet by mouth every 6 (six) hours as needed for severe pain.   ibuprofen 200 MG tablet Commonly known as:  ADVIL,MOTRIN Take 400 mg by mouth every 8 (eight) hours as needed  for headache or moderate pain.   Magnesium 250 MG Tabs Take 250 mg by mouth daily.   metoprolol succinate 50 MG 24 hr tablet Commonly known as:  TOPROL-XL TAKE ONE TABLET BY MOUTH DAILY IMMEDIATELY FOLLOWING A MEAL What changed:  See the new instructions.   multivitamin with minerals Tabs tablet Take 1 tablet by mouth daily. Centrum Silver   nateglinide 120 MG tablet Commonly known as:  STARLIX Take 120 mg by mouth Twice daily.   pioglitazone 45 MG tablet Commonly known as:  ACTOS Take 45 mg by mouth daily.   polyethylene glycol packet Commonly known as:  MIRALAX / GLYCOLAX Take 17 g by mouth daily. Start taking on:  March 08, 2018   PRESERVISION AREDS 2 Caps Take 1 capsule by mouth 2 (two) times daily.   SF 5000 PLUS 1.1 % Crea dental cream Generic drug:  sodium fluoride Place 1 application onto teeth at bedtime.   sitaGLIPtin 100 MG tablet Commonly known as:  JANUVIA Take 100 mg by mouth at bedtime.   umeclidinium-vilanterol 62.5-25 MCG/INH Aepb Commonly known as:  ANORO ELLIPTA Inhale 1 puff into the lungs daily.   Vitamin D-3 125 MCG (5000 UT) Tabs Take 5,000-10,000 Units by mouth See admin instructions.  Take 5000 units by mouth on Sunday, Tuesday, Thursday, and Saturday. Take 10000 units by mouth on Monday, Wednesday, and Friday      Allergies  Allergen Reactions  . Latex Other (See Comments)    REDNESS AT REACTION SITE.  Marland Kitchen Metformin Diarrhea  . Rivaroxaban Other (See Comments)    Headaches, blurred vision  . Other Other (See Comments)    BEE STINGS   . Sotalol Other (See Comments)    "BLUE TOES"- cut his circulation off  . Warfarin Sodium Other (See Comments)    REACTION: migraine headaches/vision impariment    Contact information for follow-up providers    [facility] Follow up.   Specialty:  Cardiology Why:  You have a hospital follow-up appointment scheduled for 03/27/2018 at 10:30am with [facility], one of Dr. Antionette Char PAs.  Please arrive 15 minutes early for check in. Contact information: [address] [place] [phone]           Contact information for after-discharge care    Destination    [facility] .   Service:  Skilled Nursing Contact information: [name] [place] [phone]                   The results of significant diagnostics from this hospitalization (including imaging, microbiology, ancillary and laboratory) are listed below for reference.    Significant Diagnostic Studies: Dg Chest 2 View  Result Date: 03/04/2018 CLINICAL DATA:  Patient fell tonight. EXAM: CHEST - 2 VIEW COMPARISON:  12/18/2017 FINDINGS: Postoperative changes in the mediastinum. Cardiac pacemaker. Cardiac enlargement. No vascular congestion, edema, or consolidation. No blunting of costophrenic angles. No pneumothorax. Mediastinal contours appear intact. IMPRESSION: Cardiac enlargement. No evidence of active pulmonary disease. Electronically Signed   By: Lucienne Capers M.D.   On: 03/04/2018 01:04   Dg Elbow Complete Right  Result Date: 03/04/2018 CLINICAL DATA:  Pain and laceration after a fall. EXAM: RIGHT ELBOW - COMPLETE 3+ VIEW COMPARISON:  None. FINDINGS: Right elbow appears intact. No evidence of acute fracture or subluxation. No focal bone lesion or bone destruction. Bone cortex and trabecular architecture appear intact. No significant effusion. No radiopaque soft tissue foreign bodies. Surgical clips in the soft tissues. IMPRESSION: 1. No acute bony abnormalities. 2. No radiopaque foreign body. Electronically Signed   By: Lucienne Capers M.D.   On: 03/04/2018 01:05   Ct Head Wo Contrast  Result Date: 03/04/2018 CLINICAL DATA:  Head trauma, patient on anticoagulation EXAM: CT HEAD WITHOUT CONTRAST TECHNIQUE: Contiguous axial images were obtained from the base of the skull through the  vertex without intravenous contrast. COMPARISON:  12/31/2017 FINDINGS: Brain: No evidence of acute infarction, hemorrhage, extra-axial collection, ventriculomegaly, or mass effect. Generalized cerebral atrophy. Periventricular white matter low attenuation likely secondary to microangiopathy. Vascular: Cerebrovascular atherosclerotic calcifications are noted. Skull: Negative for fracture or focal lesion. Sinuses/Orbits: Visualized portions of the orbits are unremarkable. Visualized portions of the paranasal sinuses are unremarkable. Visualized portions of the mastoid air cells are unremarkable. Other: None. IMPRESSION: No acute intracranial pathology. Electronically Signed   By: Kathreen Devoid   On: 03/04/2018 01:14   Dg Hip Unilat  With Pelvis 2-3 Views Right  Result Date: 03/04/2018 CLINICAL DATA:  Right hip pain after a fall. EXAM: DG HIP (WITH OR WITHOUT PELVIS) 2-3V RIGHT COMPARISON:  None. FINDINGS: Comminuted and displaced acute fractures of the right anterior iliac bone.  Right hip appears intact. Degenerative changes in the lower lumbar spine and both hips. SI joints and symphysis pubis are not displaced. Vascular calcifications. Soft tissue swelling over the right hip, likely contusion. IMPRESSION: Acute posttraumatic comminuted and displaced fractures of the anterior superior right iliac bone. Electronically Signed   By: Lucienne Capers M.D.   On: 03/04/2018 01:06    Microbiology: Recent Results (from the past 240 hour(s))  MRSA PCR Screening     Status: None   Collection Time: 03/04/18  2:42 PM  Result Value Ref Range Status   MRSA by PCR NEGATIVE NEGATIVE Final    Comment:        The GeneXpert MRSA Assay (FDA approved for NASAL specimens only), is one component of a comprehensive MRSA colonization surveillance program. It is not intended to diagnose MRSA infection nor to guide or monitor treatment for MRSA infections. Performed at [facility], [address] [place],  [place] [address]      Labs: Basic Metabolic Panel: Recent Labs  Lab 03/04/18 0030 03/04/18 0322 03/04/18 4888 03/05/18 0248 03/06/18 0314 03/07/18 0147  NA 133*  --  133* 132*  --  133*  K 4.3  --  5.9* 4.0  --  4.1  CL 99  --  102 99  --  98  CO2 24  --  23 24  --  26  GLUCOSE 127*  --  147* 181*  --  174*  BUN 11  --  13 10  --  10  CREATININE 0.93  --  0.95 0.81  --  0.88  CALCIUM 8.5*  --  7.9* 8.1*  --  8.4*  MG  --  1.8  --   --  1.9  --    Liver Function Tests: No results for input(s): AST, ALT, ALKPHOS, BILITOT, PROT, ALBUMIN in the last 168 hours. No results for input(s): LIPASE, AMYLASE in the last 168 hours. No results for input(s): AMMONIA in the last 168 hours. CBC: Recent Labs  Lab 03/04/18 0030 03/05/18 0248 03/07/18 0147  WBC 8.2 4.9 5.3  HGB 11.8* 11.0* 9.8*  HCT 34.5* 32.7* 29.2*  MCV 99.1 97.3 97.0  PLT 114* 94* 95*   Cardiac Enzymes: Recent Labs  Lab 03/04/18 0322 03/04/18 0834 03/04/18 1500  TROPONINI <0.03 <0.03 <0.03   BNP: BNP (last 3 results) Recent Labs    03/04/18 0030  BNP 219.8*    ProBNP (last 3 results) Recent Labs    12/18/17 1506  PROBNP 1,465*    CBG: Recent Labs  Lab 03/06/18 0726 03/06/18 1159 03/06/18 1647 03/06/18 2231 03/07/18 0848  GLUCAP 156* 174* 161* 161* 245*       Signed:  [name] MD.  [facility] 03/07/2018, 10:42 AM

## 2018-03-07 NOTE — Clinical Social Work Note (Signed)
CSW facilitated patient discharge including contacting facility to confirm patient discharge plans. Clinical information faxed to facility and family agreeable with plan. CSW arranged ambulance transport via [facility] to [facility] at 1:45 pm. RN to call report prior to discharge [phone]).  CSW will sign off for now as social work intervention is no longer needed. Please consult [name] again if new needs arise.  Dayton Scrape, [place]

## 2018-03-08 DIAGNOSIS — R55 Syncope and collapse: Secondary | ICD-10-CM | POA: Diagnosis not present

## 2018-03-08 DIAGNOSIS — K5900 Constipation, unspecified: Secondary | ICD-10-CM | POA: Diagnosis not present

## 2018-03-08 DIAGNOSIS — I5022 Chronic systolic (congestive) heart failure: Secondary | ICD-10-CM | POA: Diagnosis not present

## 2018-03-08 DIAGNOSIS — S329XXD Fracture of unspecified parts of lumbosacral spine and pelvis, subsequent encounter for fracture with routine healing: Secondary | ICD-10-CM | POA: Diagnosis not present

## 2018-03-08 NOTE — Consult Note (Signed)
[facility] CM Primary Care Navigator  03/08/2018  ISIDORE FRANC 1931-04-06 [id number]   Attempt to see patientat the bedside to identify possible discharge needs buthewasalready dischargedto skilled nursing facility (S[facility]- [facility]).  Per MD note,patientpresented to the ER after he suddenly passed out at home (syncope, closed fracture of right iliac crest due to fall, atrial fibrillation)  Patient has discharge instruction to follow-upwith primary care provider in 1 week and cardiology follow-up on 03/27/18.  Primary care provider's office is listed as providing transition of care (TOC) follow-up.    For additional questions please contact:  Karin Golden A. Ovadia Lopp, BSN, RN-BC [facility] PRIMARY CARE Navigator Cell: [phone]

## 2018-03-14 DIAGNOSIS — R55 Syncope and collapse: Secondary | ICD-10-CM | POA: Diagnosis not present

## 2018-03-14 DIAGNOSIS — E119 Type 2 diabetes mellitus without complications: Secondary | ICD-10-CM | POA: Diagnosis not present

## 2018-03-14 DIAGNOSIS — S329XXD Fracture of unspecified parts of lumbosacral spine and pelvis, subsequent encounter for fracture with routine healing: Secondary | ICD-10-CM | POA: Diagnosis not present

## 2018-03-14 DIAGNOSIS — K5900 Constipation, unspecified: Secondary | ICD-10-CM | POA: Diagnosis not present

## 2018-03-15 DIAGNOSIS — S329XXD Fracture of unspecified parts of lumbosacral spine and pelvis, subsequent encounter for fracture with routine healing: Secondary | ICD-10-CM | POA: Diagnosis not present

## 2018-03-15 DIAGNOSIS — R55 Syncope and collapse: Secondary | ICD-10-CM | POA: Diagnosis not present

## 2018-03-15 DIAGNOSIS — K5900 Constipation, unspecified: Secondary | ICD-10-CM | POA: Diagnosis not present

## 2018-03-15 DIAGNOSIS — E119 Type 2 diabetes mellitus without complications: Secondary | ICD-10-CM | POA: Diagnosis not present

## 2018-03-19 DIAGNOSIS — R102 Pelvic and perineal pain: Secondary | ICD-10-CM | POA: Diagnosis not present

## 2018-03-19 DIAGNOSIS — S32311A Displaced avulsion fracture of right ilium, initial encounter for closed fracture: Secondary | ICD-10-CM | POA: Diagnosis not present

## 2018-03-27 ENCOUNTER — Ambulatory Visit (INDEPENDENT_AMBULATORY_CARE_PROVIDER_SITE_OTHER): Payer: Medicare Other | Admitting: Physician Assistant

## 2018-03-27 ENCOUNTER — Encounter: Payer: Self-pay | Admitting: Physician Assistant

## 2018-03-27 VITALS — BP 110/60 | HR 64 | Ht 73.0 in | Wt 204.0 lb

## 2018-03-27 DIAGNOSIS — I951 Orthostatic hypotension: Secondary | ICD-10-CM | POA: Diagnosis not present

## 2018-03-27 DIAGNOSIS — I25119 Atherosclerotic heart disease of native coronary artery with unspecified angina pectoris: Secondary | ICD-10-CM

## 2018-03-27 DIAGNOSIS — I255 Ischemic cardiomyopathy: Secondary | ICD-10-CM | POA: Diagnosis not present

## 2018-03-27 DIAGNOSIS — I480 Paroxysmal atrial fibrillation: Secondary | ICD-10-CM | POA: Diagnosis not present

## 2018-03-27 DIAGNOSIS — Z95810 Presence of automatic (implantable) cardiac defibrillator: Secondary | ICD-10-CM

## 2018-03-27 DIAGNOSIS — I5042 Chronic combined systolic (congestive) and diastolic (congestive) heart failure: Secondary | ICD-10-CM

## 2018-03-27 NOTE — Patient Instructions (Signed)
Medication Instructions:  Your physician recommends that you continue on your current medications as directed. Please refer to the Current Medication list given to you today.  If you need a refill on your cardiac medications before your next appointment, please call your pharmacy.   Lab work: None ordered  If you have labs (blood work) drawn today and your tests are completely normal, you will receive your results only by: Marland Kitchen MyChart Message (if you have MyChart) OR . A paper copy in the mail If you have any lab test that is abnormal or we need to change your treatment, we will call you to review the results.  Testing/Procedures: None ordered  Follow-Up: Your physician recommends that you schedule a follow-up appointment in: SEE DR. KLEIN AS PREVIOUSLY SCHEDULED   Any Other Special Instructions Will Be Listed Below (If Applicable).

## 2018-03-27 NOTE — Progress Notes (Signed)
Cardiology Office Note    Date:  03/27/2018   ID:  VAN SEYMORE, DOB 04-23-1931, MRN [id number]  PCP:  [name], MD  Primary Cardiologist: Sherren Mocha, MD Primary Electrophysiologist:  [name], MD   Chief Complaint: Hospital follow up  History of Present Illness:   [name] is a 85 y.o. male with a hx of CAD s/p CABG, mitral valve repair, AICD placement after episodes of VT, HTN, DM, PAF and HLD presents for hospital follow up.   Patient has a history of coronary artery disease with remote CABG and mitral valve repair. Last cath 12/2016 as below:  1. Severe 2 vessel CAD with continued patency of the LIMA-LAD and right radial graft-OM 2. Moderate, severely calcified, proximal circumflex stenosis with negative FFR assessment 3. Normal right heart hemodynamics except for a prominent V wave in the PCWP tracing 4. Normal/low LVEDP  Recommend: ongoing medical therapy. Pt appears to be well-compensated from a hemodynamic perspective.   End of 2019 patient had bout of persistent atrial fibrillation which was quite symptomatic and prompted a visit to the A. fib clinic with the plan for anticoagulation and TEE cardioversion.  He had failed home shocks x2.  He converted then spontaneously.  He remains on Eliquis at this time. Burden decreased on follow up.   Admitted 03/03/2018 with syncope. Felt orthostatic in nature. He stand up from chair and had syncope. ICD interrogation showed controlled rates in atrial fibrillation, 88% pacing despite good underlying rhythm, and no sustained ventricular arrhythmias, and no events on the day of the syncopal episode.  Discussed conservative measures such as compression stockings, avoiding dehydration balanced with daily weights and lasix as needed for lower extremity edema. Would likely avoid starting losartan if not needed as an outpatient.   Here today for follow up. He had dizzy spell on Feb 7th at 10:30 pm at facility. He was  sitting in chair and suddenly felt dizzy with blurred vision.  He denied any chest pain, shortness of breath, weakness on one side of body, slurred speech or syncope.  He might have palpitation but not sure.  ICD did not fire.  Symptoms lasted for about 1 minute with self resolution. No recurrent episode. After discharge he started to having lower extremity edema and now back on daily Lasix 20 mg.  He denies orthopnea, PND, syncope or melena.    Past Medical History:  Diagnosis Date  . AICD (automatic cardioverter/defibrillator) present    [employer]n PPM/ICD  . Coronary artery disease    [facility]  . Diabetes mellitus   . Dysrhythmia   . Endocarditis, valve unspecified    Mitral  . Headache    hx migraines 6-7 x mo  . History of migraine headaches   . History of seasonal allergies   . ICD (implantable cardiac defibrillator) in place   . PAF (paroxysmal atrial fibrillation) ([place])    NO LAA occlusion at the time of surgery  [name] 2018  . Pleural effusion   . PVC's (premature ventricular contractions)   . Ventricular tachycardia, polymorphic ([place])    status post ICD implantation    Past Surgical History:  Procedure Laterality Date  . CARDIAC CATHETERIZATION  04/03/2007   EF 40%  . CARDIAC CATHETERIZATION  02/06/2006   EF 45%. ANTERIOR HYPOKINESIS  . CARDIAC CATHETERIZATION  05/29/2000   EF 50%. MILD ANTERIOR HYPOKINESIS  . CARDIAC CATHETERIZATION  10/25/1999   EF 50%. SEVERE MITRAL REGURGITATION  .  CARDIAC CATHETERIZATION  01/06/2003   EF 50%  . CARDIAC DEFIBRILLATOR PLACEMENT    . CATARACT EXTRACTION W/ INTRAOCULAR LENS  IMPLANT, BILATERAL    . CORONARY ARTERY BYPASS GRAFT  2001   2 VESSEL CABG AND MITRAL VALVE REPAIR. HE HAD A LIMA GRAFT TO THE LAD AND RADIAL ARTERY  GRAFT TO THE OBTUSE MARGINAL  OF LEFT CIRCUMFLEX  . EP IMPLANTABLE DEVICE N/A 11/06/2014   Procedure: ICD Generator Changeout;  Surgeon: Deboraha Sprang, MD;  Location: [facility];   Service: Cardiovascular;  Laterality: N/A;  . FOREIGN BODY REMOVAL Right 06/21/2017   Procedure: FOREIGN BODY REMOVAL ADULT RIGHT RING FINGER;  Surgeon: Leanora Cover, MD;  Location: [facility];  Service: Orthopedics;  Laterality: Right;  . HEMORRHOID SURGERY    . HEMORROIDECTOMY    . INTRAVASCULAR PRESSURE WIRE/FFR STUDY N/A 01/05/2017   Procedure: INTRAVASCULAR PRESSURE WIRE/FFR STUDY;  Surgeon: Sherren Mocha, MD;  Location: [facility];  Service: Cardiovascular;  Laterality: N/A;  . KNEE ARTHROSCOPY     RIGHT KNEE  . LEFT HEART CATHETERIZATION WITH CORONARY ANGIOGRAM N/A 07/04/2012   Procedure: LEFT HEART CATHETERIZATION WITH CORONARY ANGIOGRAM;  Surgeon: Sherren Mocha, MD;  Location: [facility];  Service: Cardiovascular;  Laterality: N/A;  . MITRAL VALVE REPLACEMENT     No LAA occlusion at the time of surgery  Karie Soda report 2018  . RIGHT/LEFT HEART CATH AND CORONARY/GRAFT ANGIOGRAPHY N/A 01/05/2017   Procedure: RIGHT/LEFT HEART CATH AND CORONARY/GRAFT ANGIOGRAPHY;  Surgeon: Sherren Mocha, MD;  Location: [facility];  Service: Cardiovascular;  Laterality: N/A;  . TRANSTHORACIC ECHOCARDIOGRAM  04/02/2007   EF 35%    Current Medications: Prior to Admission medications   Medication Sig Start Date End Date Taking? Authorizing Provider  acetaminophen (TYLENOL) 325 MG tablet Take 2 tablets (650 mg total) by mouth every 6 (six) hours as needed for mild pain (or Fever >/= 101). 03/07/18   [name], MD  atorvastatin (LIPITOR) 80 MG tablet Take 80 mg by mouth at bedtime.  07/12/10   [provider]  Cholecalciferol (VITAMIN D-3) 5000 UNITS TABS Take 5,000-10,000 Units by mouth See admin instructions. Take 5000 units by mouth on Sunday, Tuesday, Thursday, and Saturday. Take 10000 units by mouth on Monday, Wednesday, and Friday    [provider]  dofetilide ([name]) 500 MCG capsule Take 1 capsule (500 mcg total) by mouth 2 (two) times daily. 02/26/18   Deboraha Sprang,  MD  ELIQUIS 5 MG TABS tablet TAKE ONE TABLET BY MOUTH TWICE DAILY Patient taking differently: Take 5 mg by mouth 2 (two) times daily.  02/18/18   Deboraha Sprang, MD  EPIPEN 2-PAK 0.3 MG/0.3ML SOAJ injection Inject 0.3 mg into the muscle daily as needed for anaphylaxis. USE ONLY IN EMERGENCY 08/12/12   [provider]  fluticasone (FLONASE) 50 MCG/ACT nasal spray Place 1 spray into the nose daily as needed for rhinitis or allergies.    [provider]  furosemide (LASIX) 20 MG tablet Take 1 tablet (20 mg total) by mouth daily as needed for fluid or edema. 03/07/18   [name], MD  HYDROcodone-acetaminophen (NORCO/VICODIN) 5-325 MG tablet Take 1 tablet by mouth every 6 (six) hours as needed for severe pain. 03/07/18   [name], MD  ibuprofen (ADVIL,MOTRIN) 200 MG tablet Take 400 mg by mouth every 8 (eight) hours as needed for headache or moderate pain.     [provider]  Magnesium 250 MG TABS  Take 250 mg by mouth daily.     [provider]  metoprolol succinate (TOPROL-XL) 50 MG 24 hr tablet TAKE ONE TABLET BY MOUTH DAILY IMMEDIATELY FOLLOWING A MEAL 03/04/18   Deboraha Sprang, MD  Multiple Vitamin (MULTIVITAMIN WITH MINERALS) TABS Take 1 tablet by mouth daily. Centrum Silver    [provider]  Multiple Vitamins-Minerals (PRESERVISION AREDS 2) CAPS Take 1 capsule by mouth 2 (two) times daily.     [provider]  nateglinide (STARLIX) 120 MG tablet Take 120 mg by mouth Twice daily.  07/12/10   [provider]  pioglitazone (ACTOS) 45 MG tablet Take 45 mg by mouth daily.      [provider]  polyethylene glycol (MIRALAX / GLYCOLAX) packet Take 17 g by mouth daily. 03/08/18   [name], MD  Propylhexedrine [facility]) INHA Place 1 each into the nose daily as needed (for congestion).    [provider]  SF 5000 PLUS 1.1 % CREA dental cream Place 1 application onto teeth at bedtime.  06/05/17   [provider]  sitaGLIPtin (JANUVIA) 100 MG tablet Take 100 mg by mouth at bedtime.     [provider]  umeclidinium-vilanterol (ANORO ELLIPTA) 62.5-25 MCG/INH AEPB Inhale 1 puff into the lungs daily. 01/15/18   Chesley Mires, MD    Allergies:   Latex; Metformin; Rivaroxaban; Other; Sotalol; and Warfarin sodium   Social History   Socioeconomic History  . Marital status: Married    Spouse name: Mardene Celeste  . Number of children: 2  . Years of education: Not on file  . Highest education level: Associate degree: academic program  Occupational History  . Occupation: retired  Scientific laboratory technician  . Financial resource strain: Not on file  . Food insecurity:    Worry: Not on file    Inability: Not on file  . Transportation needs:    Medical: Not on file    Non-medical: Not on file  Tobacco Use  . Smoking status: Former Smoker    Packs/day: 1.00    Years: 5.00    Pack years: 5.00    Types: Cigarettes    Start date: 02/23/1959    Last attempt to quit: 01/31/1964    Years since quitting: 54.1  . Smokeless tobacco: Never Used  Substance and Sexual Activity  . Alcohol use: Yes    Alcohol/week: 0.0 standard drinks    Comment: 1-2 a week  . Drug use: No  . Sexual activity: Yes  Lifestyle  . Physical activity:    Days per week: Not on file    Minutes per session: Not on file  . Stress: Not on file  Relationships  . Social connections:    Talks on phone: Not on file    Gets together: Not on file    Attends religious service: Not on file    Active member of club or organization: Not on file    Attends meetings of clubs or organizations: Not on file    Relationship status: Not on file  Other Topics Concern  . Not on file  Social History Narrative   Patient is right-handed. He lives with his wife ina 2 story home. He drinks one cup of coffee and a diet coke a day.      Family History:  The patient's family history includes Cancer in his sister and another family member; Diabetes in an other  family member; Emphysema in his father; Heart disease in his father, paternal grandfather,  and paternal grandmother; Stroke in his mother.   ROS:   Please see the history of present illness.    ROS All other systems reviewed and are negative.   PHYSICAL EXAM:   VS:  BP 110/60   Pulse 64   Ht 6\' 1"  (1.854 m)   Wt 204 lb (92.5 kg)   BMI 26.91 kg/m    GEN: Well nourished, well developed, in no acute distress  HEENT: normal  Neck: no JVD, carotid bruits, or masses Cardiac: RRR; no murmurs, rubs, or gallops,no edema  Respiratory:  clear to auscultation bilaterally, normal work of breathing GI: soft, nontender, nondistended, + BS MS: no deformity or atrophy  Skin: warm and dry, no rash Neuro:  Alert and Oriented x 3, Strength and sensation are intact Psych: euthymic mood, full affect  Wt Readings from Last 3 Encounters:  03/27/18 204 lb (92.5 kg)  03/06/18 210 lb 12.2 oz (95.6 kg)  02/22/18 210 lb (95.3 kg)      Studies/Labs Reviewed:   EKG:  EKG is ordered today which showed atrial sensed V paced rhythm at rate of 64 bpm  Recent Labs: 12/18/2017: NT-Pro BNP 1,465 03/04/2018: B Natriuretic Peptide 219.8 03/06/2018: Magnesium 1.9 03/07/2018: BUN 10; Creatinine, Ser 0.88; Hemoglobin 9.8; Platelets 95; Potassium 4.1; Sodium 133   Lipid Panel    Component Value Date/Time   CHOL  04/02/2007 0603    195        ATP III CLASSIFICATION:  <200     mg/dL   Desirable  200-239  mg/dL   Borderline High  >=240    mg/dL   High   TRIG 231 (H) 04/02/2007 0603   HDL 47 04/02/2007 0603   CHOLHDL 4.1 04/02/2007 0603   VLDL 46 (H) 04/02/2007 0603   LDLCALC (H) 04/02/2007 0603    102        Total Cholesterol/HDL:CHD Risk Coronary Heart Disease Risk Table                     Men   Women  1/2 Average Risk   3.4   3.3    Additional studies/ records that were reviewed today include:   Echocardiogram: 03/04/2018  1. The left ventricle has mild-moderately reduced systolic function of 25-42%.  The cavity size is normal. There is mild concentric left ventricular wall thickness. Echo evidence of pseudonormal diastolic filling patterns. Elevated mean left atrial  pressure. There is abnormal septal motion consistent with RV pacing or LBBB. The apex is severely hypokinetic.  2. The right ventricle is normal in size. There is normal systolic function. Right ventricular systolic pressure is mildly elevated with an estimated pressure of 41.5 mmHg.  3. Severely dilated left atrial size.  4. Mildly dilated right atrial size.  5. Mild-to-moderate mitral valve stenosis.  6. Mild-moderate tricuspid regurgitation.  7. The ascending aorta and aortic root are normal is size and structure.  8. The inferior vena cava was dilated in size with >50% respiratory variablity.  9. No atrial level shunt detected by color flow Doppler.     ASSESSMENT & PLAN:    1. Syncope felt orthostatic in nature -Patient had a dizzy spell at facility at rest.  He was not trying to stand up.  Symptoms lasted for about 1 minute.  No associated symptoms with questionable palpitation at that time. ICD check today showed no cardiac event at time of dizzy spell Feb 7th @ 10:30pm.   2. Persistent atrial fibrillation -A  sensed V paced rhythm by EKG.  Continue Eliquis, [name] and metoprolol.  No bleeding issue.  3. HTN -Blood pressure stable on current medication.  4. CAD - No angina  5. Chronic combined CHF - Euvolemic.  Compliant with low-sodium diet.  I have advised him to cut back on daily Lasix to as needed or even few days per week.  He is slightly on the drier side.  Take Lasix based on weight, if gained 3 pounds overnight or 5 pounds per week.  Try compression stocking and leg elevation. He is agree with plan.   Medication Adjustments/Labs and Tests Ordered: Current medicines are reviewed at length with the patient today.  Concerns regarding medicines are outlined above.  Medication changes, Labs and Tests  ordered today are listed in the Patient Instructions below. Patient Instructions  Medication Instructions:  Your physician recommends that you continue on your current medications as directed. Please refer to the Current Medication list given to you today.  If you need a refill on your cardiac medications before your next appointment, please call your pharmacy.   Lab work: None ordered  If you have labs (blood work) drawn today and your tests are completely normal, you will receive your results only by: Marland Kitchen MyChart Message (if you have MyChart) OR . A paper copy in the mail If you have any lab test that is abnormal or we need to change your treatment, we will call you to review the results.  Testing/Procedures: None ordered  Follow-Up: Your physician recommends that you schedule a follow-up appointment in: SEE DR. KLEIN AS PREVIOUSLY SCHEDULED   Any Other Special Instructions Will Be Listed Below (If Applicable).       Jarrett Soho, [place]  03/27/2018 11:03 AM    [facility] [address], [place], [place]  [address] Phone: [phone]; Fax: [phone]

## 2018-04-01 NOTE — Addendum Note (Signed)
Addended by: Maren Beach, Antwan Bribiesca A on: 04/01/2018 07:59 AM   Modules accepted: Orders

## 2018-04-01 NOTE — Addendum Note (Signed)
Addended by: Maren Beach, Andrzej Scully A on: 04/01/2018 07:57 AM   Modules accepted: Orders

## 2018-04-08 NOTE — Progress Notes (Signed)
@Patient  ID: [name], male    DOB: November 21, 1931, 85 y.o.   MRN: [id number]  Chief Complaint  Patient presents with  . Follow-up    COPD    Referring provider: [name], MD  HPI:  85 year old male former smoker followed in our office for SOB and presumed COPD  PMH: CAD, VT, PAF, and systolic CHF. Smoker/ Smoking History: Former Smoker. 32 pack year. Stopped 1973. Maintenance:  Stiolto  Pt of: Dr. Halford Chessman   04/09/2018  - Visit   85 year old male former smoker followed in our office for COPD.  We have updated his smoking records today based off the history given by patient spouse here.  Patient reports that he has enjoyed Stiolto Respimat.  He has not had the coughing or the irritation that he had while using Anoro Ellipta.  He would like to continue on Stiolto Respimat.  Unfortunately the patient was provided a sample but the patient is only been taking 1 puff daily instead of the 2 puffs daily prescribed dose.  Patient does admit that he continues to have dizzy spells this is being evaluated by Dr. Burt Knack and Dr. Caryl Comes the patient's cardiologist.  Patient reports there is no pattern to this and it can happen even just when sitting down.  Patient has become quite sedentary since his fall as well as with his shortness of breath and COPD.  Patient would like to increase his daily physical activity.  MMRC - Breathlessness Score 2 - on level ground, I walk slower than people of the same age because of breathlessness, or have to stop for breathe when walking to my own pace   Tests:   Echo 12/26/17 >> EF 35 to 40%, mild MS and MR, PAS 50 mmHg  01/19/2003-CT chest with contrast- COPD, hyperinflated, no evidence of PE  02/22/2018-pulmonary function test-FVC 3.31 (75% predicted), postbronchodilator ratio of 74, postbronchodilator FEV1 2.55 (82 percent predicted), mid flow reversibility, no significant bronchodilator response, DLCO 52, concavity in flow volume loops >>> Patient has  used Anoro Ellipta this morning before testing  FENO:  No results found for: NITRICOXIDE  PFT: PFT Results Latest Ref Rng & Units 02/22/2018  FVC-Pre L 3.31  FVC-Predicted Pre % 75  FVC-Post L 3.46  FVC-Predicted Post % 79  Pre FEV1/FVC % % 71  Post FEV1/FCV % % 74  FEV1-Pre L 2.36  FEV1-Predicted Pre % 76  FEV1-Post L 2.55  DLCO UNC% % 52  DLCO COR %Predicted % 74  TLC L 5.82  TLC % Predicted % 75  RV % Predicted % 91    Imaging: No results found.    Specialty Problems      Pulmonary Problems   Asthma-COPD overlap syndrome (HCC)    02/22/2018-pulmonary function test-FVC 3.31 (75% predicted), postbronchodilator ratio of 74, postbronchodilator FEV1 2.55 (82 percent predicted), mid flow reversibility, no significant bronchodilator response, DLCO 52, concavity in flow volume loops >>> Patient has used Anoro Ellipta this morning before testing      Cough      Allergies  Allergen Reactions  . Latex Other (See Comments)    REDNESS AT REACTION SITE.  Marland Kitchen Metformin Diarrhea  . Rivaroxaban Other (See Comments)    Headaches, blurred vision  . Other Other (See Comments)    BEE STINGS   . Sotalol Other (See Comments)    "BLUE TOES"- cut his circulation off  . Warfarin Sodium Other (See Comments)    REACTION: migraine headaches/vision impariment  Immunization History  Administered Date(s) Administered  . Influenza Split 11/07/2017    Past Medical History:  Diagnosis Date  . AICD (automatic cardioverter/defibrillator) present    [employer] PPM/ICD  . Coronary artery disease    [facility]  . Diabetes mellitus   . Dysrhythmia   . Endocarditis, valve unspecified    Mitral  . Headache    hx migraines 6-7 x mo  . History of migraine headaches   . History of seasonal allergies   . ICD (implantable cardiac defibrillator) in place   . PAF (paroxysmal atrial fibrillation) ([place])    NO LAA occlusion at the time of surgery  [name] 2018  .  Pleural effusion   . PVC's (premature ventricular contractions)   . Ventricular tachycardia, polymorphic (HCC)    status post ICD implantation    Tobacco History: Social History   Tobacco Use  Smoking Status Former Smoker  . Packs/day: 2.00  . Years: 16.00  . Pack years: 32.00  . Types: Cigarettes  . Start date: 02/23/1956  . Last attempt to quit: 01/31/1971  . Years since quitting: 47.2  Smokeless Tobacco Never Used   Counseling given: Not Answered  Continue to not smoke  Outpatient Encounter Medications as of 04/09/2018  Medication Sig  . acetaminophen (TYLENOL) 325 MG tablet Take 2 tablets (650 mg total) by mouth every 6 (six) hours as needed for mild pain (or Fever >/= 101).  Marland Kitchen atorvastatin (LIPITOR) 80 MG tablet Take 80 mg by mouth at bedtime.   . Cholecalciferol (VITAMIN D-3) 5000 UNITS TABS Take 5,000-10,000 Units by mouth See admin instructions. Take 5000 units by mouth on Sunday, Tuesday, Thursday, and Saturday. Take 10000 units by mouth on Monday, Wednesday, and Friday  . dofetilide ([name]) 500 MCG capsule Take 1 capsule (500 mcg total) by mouth 2 (two) times daily.  Marland Kitchen ELIQUIS 5 MG TABS tablet TAKE ONE TABLET BY MOUTH TWICE DAILY (Patient taking differently: Take 5 mg by mouth 2 (two) times daily. )  . EPIPEN 2-PAK 0.3 MG/0.3ML SOAJ injection Inject 0.3 mg into the muscle daily as needed for anaphylaxis. USE ONLY IN EMERGENCY  . fluticasone (FLONASE) 50 MCG/ACT nasal spray Place 1 spray into the nose daily as needed for rhinitis or allergies.  . furosemide (LASIX) 20 MG tablet Take 1 tablet (20 mg total) by mouth daily as needed for fluid or edema.  Marland Kitchen HYDROcodone-acetaminophen (NORCO/VICODIN) 5-325 MG tablet Take 1 tablet by mouth every 6 (six) hours as needed for severe pain.  Marland Kitchen ibuprofen (ADVIL,MOTRIN) 200 MG tablet Take 400 mg by mouth every 8 (eight) hours as needed for headache or moderate pain.   . Magnesium 250 MG TABS Take 250 mg by mouth daily.   . metoprolol  succinate (TOPROL-XL) 50 MG 24 hr tablet TAKE ONE TABLET BY MOUTH DAILY IMMEDIATELY FOLLOWING A MEAL  . Multiple Vitamin (MULTIVITAMIN WITH MINERALS) TABS Take 1 tablet by mouth daily. Centrum Silver  . Multiple Vitamins-Minerals (PRESERVISION AREDS 2) CAPS Take 1 capsule by mouth 2 (two) times daily.   . nateglinide (STARLIX) 120 MG tablet Take 120 mg by mouth Twice daily.   . pioglitazone (ACTOS) 45 MG tablet Take 45 mg by mouth daily.    . polyethylene glycol (MIRALAX / GLYCOLAX) packet Take 17 g by mouth daily.  Marland Kitchen Propylhexedrine (BENZEDREX) INHA Place 1 each into the nose daily as needed (for congestion).  . SF 5000 PLUS 1.1 % CREA dental cream Place 1 application onto  teeth at bedtime.   . sitaGLIPtin (JANUVIA) 100 MG tablet Take 100 mg by mouth at bedtime.   . Tiotropium Bromide-Olodaterol (STIOLTO RESPIMAT) 2.5-2.5 MCG/ACT AERS Inhale into the lungs.  . Tiotropium Bromide-Olodaterol (STIOLTO RESPIMAT) 2.5-2.5 MCG/ACT AERS Inhale 2 puffs into the lungs daily.  . Tiotropium Bromide-Olodaterol (STIOLTO RESPIMAT) 2.5-2.5 MCG/ACT AERS Inhale 2 puffs into the lungs daily.  . [DISCONTINUED] umeclidinium-vilanterol (ANORO ELLIPTA) 62.5-25 MCG/INH AEPB Inhale 1 puff into the lungs daily. (Patient not taking: Reported on 04/09/2018)   No facility-administered encounter medications on file as of 04/09/2018.      Review of Systems  Review of Systems  Constitutional: Negative for activity change, chills, fatigue, fever and unexpected weight change.  HENT: Negative for congestion, postnasal drip, rhinorrhea, sneezing and sore throat.   Eyes: Negative.   Respiratory: Negative for cough, shortness of breath and wheezing.   Cardiovascular: Negative for chest pain and palpitations.  Gastrointestinal: Negative for diarrhea, nausea and vomiting.  Endocrine: Negative.   Musculoskeletal: Positive for gait problem.  Skin: Negative.   Neurological: Positive for dizziness. Negative for headaches.    Psychiatric/Behavioral: Negative.  Negative for dysphoric mood. The patient is not nervous/anxious.   All other systems reviewed and are negative.    Physical Exam  BP (!) 100/56 (BP Location: Left Arm, Cuff Size: Normal)   Pulse 86   Ht 6\' 2"  (1.88 m)   Wt 207 lb 12.8 oz (94.3 kg)   SpO2 96%   BMI 26.68 kg/m   Wt Readings from Last 5 Encounters:  04/09/18 207 lb 12.8 oz (94.3 kg)  03/27/18 204 lb (92.5 kg)  03/06/18 210 lb 12.2 oz (95.6 kg)  02/22/18 210 lb (95.3 kg)  02/20/18 210 lb 3.2 oz (95.3 kg)     Physical Exam  Constitutional: He is oriented to person, place, and time and well-developed, well-nourished, and in no distress. No distress.  HENT:  Head: Normocephalic and atraumatic.  Right Ear: Hearing, tympanic membrane, external ear and ear canal normal.  Left Ear: Hearing, tympanic membrane, external ear and ear canal normal.  Nose: Mucosal edema present. Epistaxis is observed. Right sinus exhibits no maxillary sinus tenderness and no frontal sinus tenderness. Left sinus exhibits no maxillary sinus tenderness and no frontal sinus tenderness.  Mouth/Throat: Uvula is midline and oropharynx is clear and moist. No oropharyngeal exudate.  Healing right nare nasal mucosa  Eyes: Pupils are equal, round, and reactive to light.  Neck: Normal range of motion. Neck supple. No JVD present.  Cardiovascular: Normal rate, regular rhythm and normal heart sounds.  Pulmonary/Chest: Effort normal and breath sounds normal. No accessory muscle usage. No respiratory distress. He has no decreased breath sounds. He has no wheezes. He has no rhonchi.  Musculoskeletal: Normal range of motion.        General: No edema.  Lymphadenopathy:    He has no cervical adenopathy.  Neurological: He is alert and oriented to person, place, and time. Gait normal.  Skin: Skin is warm and dry. He is not diaphoretic. No erythema.  Psychiatric: Mood, memory, affect and judgment normal.  Nursing note and  vitals reviewed.     Lab Results:  CBC    Component Value Date/Time   WBC 5.3 03/07/2018 0147   RBC 3.01 (L) 03/07/2018 0147   HGB 9.8 (L) 03/07/2018 0147   HGB 13.2 12/18/2017 1506   HCT 29.2 (L) 03/07/2018 0147   HCT 38.9 12/18/2017 1506   PLT 95 (L) 03/07/2018 0147   PLT  156 12/18/2017 1506   MCV 97.0 03/07/2018 0147   MCV 99 (H) 12/18/2017 1506   MCH 32.6 03/07/2018 0147   MCHC 33.6 03/07/2018 0147   RDW 13.1 03/07/2018 0147   RDW 13.1 12/18/2017 1506   LYMPHSABS 0.9 12/27/2016 1523   MONOABS 407 08/25/2015 1015   EOSABS 0.2 12/27/2016 1523   BASOSABS 0.0 12/27/2016 1523    BMET    Component Value Date/Time   NA 133 (L) 03/07/2018 0147   NA 136 12/18/2017 1506   K 4.1 03/07/2018 0147   CL 98 03/07/2018 0147   CO2 26 03/07/2018 0147   GLUCOSE 174 (H) 03/07/2018 0147   BUN 10 03/07/2018 0147   BUN 13 12/18/2017 1506   CREATININE 0.88 03/07/2018 0147   CREATININE 1.08 01/04/2016 1630   CALCIUM 8.4 (L) 03/07/2018 0147   GFRNONAA >60 03/07/2018 0147   GFRAA >60 03/07/2018 0147    BNP    Component Value Date/Time   BNP 219.8 (H) 03/04/2018 0030    ProBNP    Component Value Date/Time   PROBNP 1,465 (H) 12/18/2017 1506   PROBNP 463.0 (H) 04/02/2007 0603      Assessment & Plan:     Asthma-COPD overlap syndrome (HCC) Assessment: mMRC 2 today Maintained well on Stiolto Respimat January/2020-pulmonary function test shows an FEV1 to 2.55 (82% predicted, DLCO 52, concavity and flow volume loops Updated smoking records show a 32-pack-year smoking history Lungs clear to auscultation today Patient up-to-date on flu and pneumonia vaccines  Plan: Continue Stiolto Respimat Sample provided today Prescription sent to pharmacy for [place] referral to pulmonary rehab, patient declined Patient to increase daily physical activity Patient to follow-up with Dr. Halford Chessman in 3 months  Cough Assessment: Cough has resolved since switching to  [place]: Continue Jackson, NP 04/09/2018   This appointment was 30 min long with over 50% of the time in direct face-to-face patient care, assessment, plan of care, and follow-up.

## 2018-04-09 ENCOUNTER — Encounter: Payer: Self-pay | Admitting: Pulmonary Disease

## 2018-04-09 ENCOUNTER — Ambulatory Visit (INDEPENDENT_AMBULATORY_CARE_PROVIDER_SITE_OTHER): Payer: Medicare Other | Admitting: Pulmonary Disease

## 2018-04-09 DIAGNOSIS — R05 Cough: Secondary | ICD-10-CM | POA: Diagnosis not present

## 2018-04-09 DIAGNOSIS — I255 Ischemic cardiomyopathy: Secondary | ICD-10-CM

## 2018-04-09 DIAGNOSIS — R059 Cough, unspecified: Secondary | ICD-10-CM

## 2018-04-09 MED ORDER — TIOTROPIUM BROMIDE-OLODATEROL 2.5-2.5 MCG/ACT IN AERS: 2.0000 | INHALATION_SPRAY | Freq: Every day | RESPIRATORY_TRACT | 5 refills | Status: DC

## 2018-04-09 MED ORDER — TIOTROPIUM BROMIDE-OLODATEROL 2.5-2.5 MCG/ACT IN AERS: 2.0000 | INHALATION_SPRAY | Freq: Every day | RESPIRATORY_TRACT | 0 refills | Status: DC

## 2018-04-09 NOTE — Progress Notes (Signed)
Reviewed and agree with assessment/plan.   Tawfiq Favila, MD [facility] 01/26/2016, 12:24 PM Pager:  [phone]  

## 2018-04-09 NOTE — Assessment & Plan Note (Signed)
Assessment: Cough has resolved since switching to [employer] Respimat  Plan: Continue Stiolto Respimat

## 2018-04-09 NOTE — Assessment & Plan Note (Signed)
Assessment: mMRC 2 today Maintained well on Stiolto Respimat January/2020-pulmonary function test shows an FEV1 to 2.55 (82% predicted, DLCO 52, concavity and flow volume loops Updated smoking records show a 32-pack-year smoking history Lungs clear to auscultation today Patient up-to-date on flu and pneumonia vaccines  Plan: Continue Stiolto Respimat Sample provided today Prescription sent to pharmacy for [place] referral to pulmonary rehab, patient declined Patient to increase daily physical activity Patient to follow-up with Dr. Halford Chessman in 3 months

## 2018-04-09 NOTE — Patient Instructions (Addendum)
Stiolto Respimat inhaler >>>2 puffs daily >>>Take this no matter what >>>This is not a rescue inhaler >>>Sample provided today  >>>Prescription sent to pharmacy   Only use your albuterol as a rescue medication to be used if you can't catch your breath by resting or doing a relaxed purse lip breathing pattern.  - The less you use it, the better it will work when you need it. - Ok to use up to 2 puffs  every 4 hours if you must but call for immediate appointment if use goes up over your usual need - Don't leave home without it !!  (think of it like the spare tire for your car)   Note your daily symptoms > remember "red flags" for COPD:   >>>Increase in cough >>>increase in sputum production >>>increase in shortness of breath or activity  intolerance.   If you notice these symptoms, please call the office to be seen.   For nasal irritation can use nasal saline gel samples provided today   Increase daily physical activity >>>Could consider pulmonary rehab referral in the future if you change your mind   Follow-up with Dr. Halford Chessman in 3 months   It is flu season:   >>> Best ways to protect herself from the flu: Receive the yearly flu vaccine, practice good hand hygiene washing with soap and also using hand sanitizer when available, eat a nutritious meals, get adequate rest, hydrate appropriately   Please contact the office if your symptoms worsen or you have concerns that you are not improving.   Thank you for choosing [facility] for your healthcare, and for allowing [name] to partner with you on your healthcare journey. I am thankful to be able to provide care to you today.   [name] FNP-C

## 2018-04-15 MED ORDER — DOFETILIDE 500 MCG PO CAPS: 500.0000 ug | ORAL_CAPSULE | Freq: Two times a day (BID) | ORAL | 3 refills | Status: DC

## 2018-04-16 DIAGNOSIS — S32301D Unspecified fracture of right ilium, subsequent encounter for fracture with routine healing: Secondary | ICD-10-CM | POA: Diagnosis not present

## 2018-04-25 ENCOUNTER — Ambulatory Visit: Payer: Medicare Other | Admitting: Neurology

## 2018-05-07 ENCOUNTER — Ambulatory Visit (INDEPENDENT_AMBULATORY_CARE_PROVIDER_SITE_OTHER): Payer: Medicare Other | Admitting: *Deleted

## 2018-05-07 ENCOUNTER — Other Ambulatory Visit: Payer: Self-pay

## 2018-05-07 DIAGNOSIS — I255 Ischemic cardiomyopathy: Secondary | ICD-10-CM | POA: Diagnosis not present

## 2018-05-07 DIAGNOSIS — I5022 Chronic systolic (congestive) heart failure: Secondary | ICD-10-CM

## 2018-05-07 LAB — CUP PACEART REMOTE DEVICE CHECK
Battery Remaining Longevity: 44 mo
Battery Voltage: 2.97 V
Brady Statistic AP VP Percent: 52.33 %
Brady Statistic AP VS Percent: 0.57 %
Brady Statistic AS VP Percent: 33.92 %
Brady Statistic AS VS Percent: 13.18 %
Brady Statistic RA Percent Paced: 50.89 %
Brady Statistic RV Percent Paced: 85.98 %
Date Time Interrogation Session: 20200407052204
HighPow Impedance: 38 Ohm
HighPow Impedance: 53 Ohm
Implantable Lead Implant Date: 20040309
Implantable Lead Implant Date: 20040309
Implantable Lead Location: 753859
Implantable Lead Location: 753860
Implantable Lead Model: 158
Implantable Lead Model: 5076
Implantable Lead Serial Number: 115061
Implantable Pulse Generator Implant Date: 20161007
Lead Channel Impedance Value: 4047 Ohm
Lead Channel Impedance Value: 4047 Ohm
Lead Channel Impedance Value: 4047 Ohm
Lead Channel Impedance Value: 4047 Ohm
Lead Channel Impedance Value: 4047 Ohm
Lead Channel Impedance Value: 4047 Ohm
Lead Channel Impedance Value: 4047 Ohm
Lead Channel Impedance Value: 4047 Ohm
Lead Channel Impedance Value: 4047 Ohm
Lead Channel Impedance Value: 4047 Ohm
Lead Channel Impedance Value: 418 Ohm
Lead Channel Impedance Value: 456 Ohm
Lead Channel Impedance Value: 456 Ohm
Lead Channel Pacing Threshold Amplitude: 0.5 V
Lead Channel Pacing Threshold Amplitude: 0.75 V
Lead Channel Pacing Threshold Pulse Width: 0.4 ms
Lead Channel Pacing Threshold Pulse Width: 0.4 ms
Lead Channel Sensing Intrinsic Amplitude: 0.875 mV
Lead Channel Sensing Intrinsic Amplitude: 0.875 mV
Lead Channel Sensing Intrinsic Amplitude: 5.875 mV
Lead Channel Sensing Intrinsic Amplitude: 5.875 mV
Lead Channel Setting Pacing Amplitude: 2 V
Lead Channel Setting Pacing Amplitude: 2.5 V
Lead Channel Setting Pacing Pulse Width: 0.4 ms
Lead Channel Setting Sensing Sensitivity: 0.3 mV

## 2018-05-15 NOTE — Progress Notes (Signed)
Remote ICD transmission.   

## 2018-05-28 DIAGNOSIS — S32301D Unspecified fracture of right ilium, subsequent encounter for fracture with routine healing: Secondary | ICD-10-CM | POA: Diagnosis not present

## 2018-06-15 DIAGNOSIS — R220 Localized swelling, mass and lump, head: Secondary | ICD-10-CM | POA: Diagnosis not present

## 2018-06-15 DIAGNOSIS — B0222 Postherpetic trigeminal neuralgia: Secondary | ICD-10-CM | POA: Diagnosis not present

## 2018-07-10 ENCOUNTER — Ambulatory Visit: Payer: Medicare Other | Admitting: Pulmonary Disease

## 2018-07-17 DIAGNOSIS — E782 Mixed hyperlipidemia: Secondary | ICD-10-CM | POA: Diagnosis not present

## 2018-07-17 DIAGNOSIS — E559 Vitamin D deficiency, unspecified: Secondary | ICD-10-CM | POA: Diagnosis not present

## 2018-07-17 DIAGNOSIS — E1159 Type 2 diabetes mellitus with other circulatory complications: Secondary | ICD-10-CM | POA: Diagnosis not present

## 2018-07-18 DIAGNOSIS — E782 Mixed hyperlipidemia: Secondary | ICD-10-CM | POA: Diagnosis not present

## 2018-07-18 DIAGNOSIS — I2510 Atherosclerotic heart disease of native coronary artery without angina pectoris: Secondary | ICD-10-CM | POA: Diagnosis not present

## 2018-07-18 DIAGNOSIS — E113393 Type 2 diabetes mellitus with moderate nonproliferative diabetic retinopathy without macular edema, bilateral: Secondary | ICD-10-CM | POA: Diagnosis not present

## 2018-07-18 DIAGNOSIS — E559 Vitamin D deficiency, unspecified: Secondary | ICD-10-CM | POA: Diagnosis not present

## 2018-07-18 DIAGNOSIS — I480 Paroxysmal atrial fibrillation: Secondary | ICD-10-CM | POA: Diagnosis not present

## 2018-07-18 DIAGNOSIS — E1159 Type 2 diabetes mellitus with other circulatory complications: Secondary | ICD-10-CM | POA: Diagnosis not present

## 2018-08-05 NOTE — Progress Notes (Signed)
NEUROLOGY FOLLOW UP OFFICE NOTE  [name] [id number]  HISTORY OF PRESENT ILLNESS: [name] is an 85 year old right-handed male with polymorphic ventricular tachycardia s/p ICD, CAD s/p CABG, diabetes, history of of migraines and history of mitral valve repair and PFO closure who follows up for transient diplopia, presumably TIA.  UPDATE: Current medications:  Eliquis, Lipitor, Lasix, Toprol, Januvia  CTA of head and neck from 12/31/17 was personally reviewed and demonstrated atherosclerosis with 50% narrowing at the right ICA bulb and distal right MCA branch calcification but otherwise no emergent large vessel occlusion or stenosis.  He had an echocardiogram on 12/26/17 which demonstrated EF 35-40% with dilated left atrium, fairly stable.  He was admitted to the hospital on 03/03/18 for syncope in which he sustained a closed fracture of the right iliac crest.  CT of head showed no acute intracranial abnormalities.  Echo showed no new abnormalities with EF 40-45% and inferior/apical akinesis.  QTc noted to be mildly prolonged at 515.  Pacemaker interrogation revealed atrial arrhythmias but no VT.  Thought to be orthostatic in nature.    He still has episodes of dizziness with double vision about every 10 to 14 days, lasting 10-30 seconds.  He has been experiencing episodes of atrial fibrillation, on average occurring 10-12 days a month, often several days in a row.  Recent labs:  LDL 72, Hgb A1c 6.6  HISTORY: He has an ICD for history of polymorphic ventricular tachycardia.  In 2019, he has had bouts of persistent atrial fibrillation.  He had failed 2 attempts at cardioversion but then converted spontaneously.  ASA was stopped and he was started on Eliquis last month.  On 12/08/17, he was riding in the car in the passenger side and when he looked up, he saw two traffic signs, blurred and slightly skewed.  He looked out of the side windows and everything was blurred/double.  It lasted  45 seconds and resolved.  He did not have associated slurred speech, facial droop or unilateral numbness or weakness.  His ICD was checked and there was no associated arrhythmia.     PAST MEDICAL HISTORY: Past Medical History:  Diagnosis Date  . AICD (automatic cardioverter/defibrillator) present    Medtronicn PPM/ICD  . Coronary artery disease    [facility]  . Diabetes mellitus   . Dysrhythmia   . Endocarditis, valve unspecified    Mitral  . Headache    hx migraines 6-7 x mo  . History of migraine headaches   . History of seasonal allergies   . ICD (implantable cardiac defibrillator) in place   . PAF (paroxysmal atrial fibrillation) ([place])    NO LAA occlusion at the time of surgery  [name] 2018  . Pleural effusion   . PVC's (premature ventricular contractions)   . Ventricular tachycardia, polymorphic ([place])    status post ICD implantation    MEDICATIONS: Current Outpatient Medications on File Prior to Visit  Medication Sig Dispense Refill  . acetaminophen (TYLENOL) 325 MG tablet Take 2 tablets (650 mg total) by mouth every 6 (six) hours as needed for mild pain (or Fever >/= 101).    Marland Kitchen atorvastatin (LIPITOR) 80 MG tablet Take 80 mg by mouth at bedtime.     . Cholecalciferol (VITAMIN D-3) 5000 UNITS TABS Take 5,000-10,000 Units by mouth See admin instructions. Take 5000 units by mouth on Sunday, Tuesday, Thursday, and Saturday. Take 10000 units by mouth on Monday, Wednesday, and Friday    .  dofetilide ([name]) 500 MCG capsule Take 1 capsule (500 mcg total) by mouth 2 (two) times daily. 90 capsule 3  . ELIQUIS 5 MG TABS tablet TAKE ONE TABLET BY MOUTH TWICE DAILY (Patient taking differently: Take 5 mg by mouth 2 (two) times daily. ) 60 tablet 6  . EPIPEN 2-PAK 0.3 MG/0.3ML SOAJ injection Inject 0.3 mg into the muscle daily as needed for anaphylaxis. USE ONLY IN EMERGENCY    . fluticasone (FLONASE) 50 MCG/ACT nasal spray Place 1 spray into the nose daily as  needed for rhinitis or allergies.    . furosemide (LASIX) 20 MG tablet Take 1 tablet (20 mg total) by mouth daily as needed for fluid or edema.    Marland Kitchen HYDROcodone-acetaminophen (NORCO/VICODIN) 5-325 MG tablet Take 1 tablet by mouth every 6 (six) hours as needed for severe pain. 10 tablet 0  . ibuprofen (ADVIL,MOTRIN) 200 MG tablet Take 400 mg by mouth every 8 (eight) hours as needed for headache or moderate pain.     . Magnesium 250 MG TABS Take 250 mg by mouth daily.     . metoprolol succinate (TOPROL-XL) 50 MG 24 hr tablet TAKE ONE TABLET BY MOUTH DAILY IMMEDIATELY FOLLOWING A MEAL 90 tablet 3  . Multiple Vitamin (MULTIVITAMIN WITH MINERALS) TABS Take 1 tablet by mouth daily. Centrum Silver    . Multiple Vitamins-Minerals (PRESERVISION AREDS 2) CAPS Take 1 capsule by mouth 2 (two) times daily.     . nateglinide (STARLIX) 120 MG tablet Take 120 mg by mouth Twice daily.     . pioglitazone (ACTOS) 45 MG tablet Take 45 mg by mouth daily.      . polyethylene glycol (MIRALAX / GLYCOLAX) packet Take 17 g by mouth daily. 14 each 0  . Propylhexedrine (BENZEDREX) INHA Place 1 each into the nose daily as needed (for congestion).    . SF 5000 PLUS 1.1 % CREA dental cream Place 1 application onto teeth at bedtime.   6  . sitaGLIPtin (JANUVIA) 100 MG tablet Take 100 mg by mouth at bedtime.     . Tiotropium Bromide-Olodaterol (STIOLTO RESPIMAT) 2.5-2.5 MCG/ACT AERS Inhale into the lungs.    . Tiotropium Bromide-Olodaterol (STIOLTO RESPIMAT) 2.5-2.5 MCG/ACT AERS Inhale 2 puffs into the lungs daily. 1 Inhaler 0  . Tiotropium Bromide-Olodaterol (STIOLTO RESPIMAT) 2.5-2.5 MCG/ACT AERS Inhale 2 puffs into the lungs daily. 1 Inhaler 5   No current facility-administered medications on file prior to visit.     ALLERGIES: Allergies  Allergen Reactions  . Latex Other (See Comments)    REDNESS AT REACTION SITE.  Marland Kitchen Metformin Diarrhea  . Rivaroxaban Other (See Comments)    Headaches, blurred vision  . Other Other  (See Comments)    BEE STINGS   . Sotalol Other (See Comments)    "BLUE TOES"- cut his circulation off  . Warfarin Sodium Other (See Comments)    REACTION: migraine headaches/vision impariment    FAMILY HISTORY: Family History  Problem Relation Age of Onset  . Heart disease Father   . Emphysema Father   . Stroke Mother   . Cancer Sister        breast cancer  . Heart disease Paternal Grandmother   . Heart disease Paternal Grandfather   . Diabetes Other        grandmother, aunt, uncle  . Cancer Other        lung cancer, aunt   SOCIAL HISTORY: Social History   Socioeconomic History  . Marital status: Married  Spouse name: Mardene Celeste  . Number of children: 2  . Years of education: Not on file  . Highest education level: Associate degree: academic program  Occupational History  . Occupation: retired  Scientific laboratory technician  . Financial resource strain: Not on file  . Food insecurity    Worry: Not on file    Inability: Not on file  . Transportation needs    Medical: Not on file    Non-medical: Not on file  Tobacco Use  . Smoking status: Former Smoker    Packs/day: 2.00    Years: 16.00    Pack years: 32.00    Types: Cigarettes    Start date: 02/23/1956    Quit date: 01/31/1971    Years since quitting: 47.5  . Smokeless tobacco: Never Used  Substance and Sexual Activity  . Alcohol use: Yes    Alcohol/week: 0.0 standard drinks    Comment: 1-2 a week  . Drug use: No  . Sexual activity: Yes  Lifestyle  . Physical activity    Days per week: Not on file    Minutes per session: Not on file  . Stress: Not on file  Relationships  . Social Herbalist on phone: Not on file    Gets together: Not on file    Attends religious service: Not on file    Active member of club or organization: Not on file    Attends meetings of clubs or organizations: Not on file    Relationship status: Not on file  . Intimate partner violence    Fear of current or ex partner: Not on file     Emotionally abused: Not on file    Physically abused: Not on file    Forced sexual activity: Not on file  Other Topics Concern  . Not on file  Social History Narrative   Patient is right-handed. He lives with his wife ina 2 story home. He drinks one cup of coffee and a diet coke a day.     REVIEW OF SYSTEMS: Constitutional: No fevers, chills, or sweats, no generalized fatigue, change in appetite Eyes: No visual changes, double vision, eye pain Ear, nose and throat: No hearing loss, ear pain, nasal congestion, sore throat Cardiovascular: No chest pain, palpitations Respiratory:  No shortness of breath at rest or with exertion, wheezes GastrointestinaI: No nausea, vomiting, diarrhea, abdominal pain, fecal incontinence Genitourinary:  No dysuria, urinary retention or frequency Musculoskeletal:  No neck pain, back pain Integumentary: No rash, pruritus, skin lesions Neurological: as above Psychiatric: No depression, insomnia, anxiety Endocrine: No palpitations, fatigue, diaphoresis, mood swings, change in appetite, change in weight, increased thirst Hematologic/Lymphatic:  No purpura, petechiae. Allergic/Immunologic: no itchy/runny eyes, nasal congestion, recent allergic reactions, rashes  PHYSICAL EXAM: Blood pressure 134/82, pulse 79, temperature 98.9 F (37.2 C), temperature source Oral, height 6\' 2"  (1.88 m), weight 213 lb (96.6 kg), SpO2 (!) 79 %. General: No acute distress.  Patient appears well-groomed.   Head:  Normocephalic/atraumatic Eyes:  Fundi examined but not visualized Neck: supple, no paraspinal tenderness, full range of motion Heart:  Regular rate and rhythm Lungs:  Clear to auscultation bilaterally Back: No paraspinal tenderness Neurological Exam: alert and oriented to person, place, and time. Attention span and concentration intact, recent and remote memory intact, fund of knowledge intact.  Speech fluent and not dysarthric, language intact.  CN II-XII intact. Bulk  and tone normal, muscle strength 5/5 throughout.  Sensation to light touch, temperature and vibration intact.  Deep tendon reflexes 2+ throughout, toes downgoing.  Finger to nose and heel to shin testing intact.  Gait normal, Romberg negative.  IMPRESSION: 1.  Transient dizziness with diplopia.  Initially, TIA was possible.  However, they are now recurrent, making TIA less likely.  He reports increased runs of atrial fibrillation, which may be the source.  He has migraines, making migraine possible. 2.  Atrial fibrillation with ventricular tachycardia s/p AICD 3.  Hypertension 4.  Hyperlipidemia 5.  Type 2 diabetes mellitus 6.  Chronic migraine/migraine with aura, without status migrainosus, not intractable  PLAN: 1.  Start Aimovig 70mg  monthly 2.  Continue anticoagulation and treatment of stroke risk factors 3.  He will correlate with next attack and atrial fibrillation 4.  Limit use of pain relievers to no more than 2 days out of week to prevent risk of rebound or medication-overuse headache. 5.  Follow up in 4 months.   25 minutes spent face to face with patient, over 50% spent discussing diagnosis and management.  Metta Clines, DO  CC:  [name], MD

## 2018-08-06 ENCOUNTER — Other Ambulatory Visit: Payer: Self-pay

## 2018-08-06 ENCOUNTER — Encounter: Payer: Self-pay | Admitting: Neurology

## 2018-08-06 ENCOUNTER — Ambulatory Visit (INDEPENDENT_AMBULATORY_CARE_PROVIDER_SITE_OTHER): Payer: Medicare Other | Admitting: *Deleted

## 2018-08-06 ENCOUNTER — Ambulatory Visit (INDEPENDENT_AMBULATORY_CARE_PROVIDER_SITE_OTHER): Payer: Medicare Other | Admitting: Neurology

## 2018-08-06 VITALS — BP 134/82 | HR 79 | Temp 98.9°F | Ht 74.0 in | Wt 213.0 lb

## 2018-08-06 DIAGNOSIS — I25118 Atherosclerotic heart disease of native coronary artery with other forms of angina pectoris: Secondary | ICD-10-CM | POA: Diagnosis not present

## 2018-08-06 DIAGNOSIS — I4811 Longstanding persistent atrial fibrillation: Secondary | ICD-10-CM

## 2018-08-06 DIAGNOSIS — H532 Diplopia: Secondary | ICD-10-CM

## 2018-08-06 DIAGNOSIS — R42 Dizziness and giddiness: Secondary | ICD-10-CM | POA: Diagnosis not present

## 2018-08-06 DIAGNOSIS — I255 Ischemic cardiomyopathy: Secondary | ICD-10-CM

## 2018-08-06 DIAGNOSIS — G43109 Migraine with aura, not intractable, without status migrainosus: Secondary | ICD-10-CM

## 2018-08-06 MED ORDER — AIMOVIG 70 MG/ML ~~LOC~~ SOAJ: 70.0000 mg | SUBCUTANEOUS | 11 refills | Status: DC

## 2018-08-06 MED ORDER — AIMOVIG 70 MG/ML ~~LOC~~ SOAJ: 70.0000 mg | Freq: Once | SUBCUTANEOUS | 0 refills | Status: AC

## 2018-08-06 NOTE — Patient Instructions (Signed)
1.  To reduce migraines, I will start you on Aimovig 70mg  self-injection every 30 days. 2.  Limit use of pain relievers to no more than 2 days out of week to prevent risk of rebound or medication-overuse headache. 3.  Follow up in 4 months.

## 2018-08-07 LAB — CUP PACEART REMOTE DEVICE CHECK
Battery Remaining Longevity: 43 mo
Battery Voltage: 2.96 V
Brady Statistic AP VP Percent: 53.22 %
Brady Statistic AP VS Percent: 0.39 %
Brady Statistic AS VP Percent: 34.87 %
Brady Statistic AS VS Percent: 11.51 %
Brady Statistic RA Percent Paced: 51.13 %
Brady Statistic RV Percent Paced: 87.96 %
Date Time Interrogation Session: 20200707083825
HighPow Impedance: 39 Ohm
HighPow Impedance: 49 Ohm
Implantable Lead Implant Date: 20040309
Implantable Lead Implant Date: 20040309
Implantable Lead Location: 753859
Implantable Lead Location: 753860
Implantable Lead Model: 158
Implantable Lead Model: 5076
Implantable Lead Serial Number: 115061
Implantable Pulse Generator Implant Date: 20161007
Lead Channel Impedance Value: 399 Ohm
Lead Channel Impedance Value: 4047 Ohm
Lead Channel Impedance Value: 4047 Ohm
Lead Channel Impedance Value: 4047 Ohm
Lead Channel Impedance Value: 4047 Ohm
Lead Channel Impedance Value: 4047 Ohm
Lead Channel Impedance Value: 4047 Ohm
Lead Channel Impedance Value: 4047 Ohm
Lead Channel Impedance Value: 4047 Ohm
Lead Channel Impedance Value: 4047 Ohm
Lead Channel Impedance Value: 4047 Ohm
Lead Channel Impedance Value: 475 Ohm
Lead Channel Impedance Value: 513 Ohm
Lead Channel Pacing Threshold Amplitude: 0.5 V
Lead Channel Pacing Threshold Amplitude: 0.625 V
Lead Channel Pacing Threshold Pulse Width: 0.4 ms
Lead Channel Pacing Threshold Pulse Width: 0.4 ms
Lead Channel Sensing Intrinsic Amplitude: 1.75 mV
Lead Channel Sensing Intrinsic Amplitude: 1.75 mV
Lead Channel Sensing Intrinsic Amplitude: 7 mV
Lead Channel Sensing Intrinsic Amplitude: 7 mV
Lead Channel Setting Pacing Amplitude: 2 V
Lead Channel Setting Pacing Amplitude: 2.5 V
Lead Channel Setting Pacing Pulse Width: 0.4 ms
Lead Channel Setting Sensing Sensitivity: 0.3 mV

## 2018-08-18 ENCOUNTER — Encounter: Payer: Self-pay | Admitting: Cardiology

## 2018-08-18 NOTE — Progress Notes (Signed)
Remote ICD transmission.   

## 2018-08-19 ENCOUNTER — Telehealth: Payer: Self-pay | Admitting: Internal Medicine

## 2018-08-19 NOTE — Telephone Encounter (Signed)

## 2018-08-20 ENCOUNTER — Encounter: Payer: Self-pay | Admitting: Internal Medicine

## 2018-08-20 ENCOUNTER — Other Ambulatory Visit: Payer: Self-pay

## 2018-08-20 ENCOUNTER — Encounter: Payer: Medicare Other | Admitting: Internal Medicine

## 2018-08-20 ENCOUNTER — Ambulatory Visit (INDEPENDENT_AMBULATORY_CARE_PROVIDER_SITE_OTHER): Payer: Medicare Other | Admitting: Internal Medicine

## 2018-08-20 VITALS — BP 118/76 | HR 66 | Ht 74.0 in | Wt 213.0 lb

## 2018-08-20 DIAGNOSIS — I25119 Atherosclerotic heart disease of native coronary artery with unspecified angina pectoris: Secondary | ICD-10-CM

## 2018-08-20 DIAGNOSIS — I5022 Chronic systolic (congestive) heart failure: Secondary | ICD-10-CM | POA: Diagnosis not present

## 2018-08-20 DIAGNOSIS — Z95810 Presence of automatic (implantable) cardiac defibrillator: Secondary | ICD-10-CM

## 2018-08-20 DIAGNOSIS — I480 Paroxysmal atrial fibrillation: Secondary | ICD-10-CM

## 2018-08-20 DIAGNOSIS — I255 Ischemic cardiomyopathy: Secondary | ICD-10-CM

## 2018-08-20 LAB — CUP PACEART INCLINIC DEVICE CHECK
Battery Remaining Longevity: 42 mo
Battery Voltage: 2.96 V
Brady Statistic AP VP Percent: 49.95 %
Brady Statistic AP VS Percent: 0.46 %
Brady Statistic AS VP Percent: 35.84 %
Brady Statistic AS VS Percent: 13.75 %
Brady Statistic RA Percent Paced: 48.05 %
Brady Statistic RV Percent Paced: 85.7 %
Date Time Interrogation Session: 20200721174232
HighPow Impedance: 39 Ohm
HighPow Impedance: 54 Ohm
Implantable Lead Implant Date: 20040309
Implantable Lead Implant Date: 20040309
Implantable Lead Location: 753859
Implantable Lead Location: 753860
Implantable Lead Model: 158
Implantable Lead Model: 5076
Implantable Lead Serial Number: 115061
Implantable Pulse Generator Implant Date: 20161007
Lead Channel Impedance Value: 399 Ohm
Lead Channel Impedance Value: 4047 Ohm
Lead Channel Impedance Value: 4047 Ohm
Lead Channel Impedance Value: 4047 Ohm
Lead Channel Impedance Value: 4047 Ohm
Lead Channel Impedance Value: 4047 Ohm
Lead Channel Impedance Value: 4047 Ohm
Lead Channel Impedance Value: 4047 Ohm
Lead Channel Impedance Value: 4047 Ohm
Lead Channel Impedance Value: 4047 Ohm
Lead Channel Impedance Value: 4047 Ohm
Lead Channel Impedance Value: 456 Ohm
Lead Channel Impedance Value: 456 Ohm
Lead Channel Pacing Threshold Amplitude: 0.5 V
Lead Channel Pacing Threshold Amplitude: 0.625 V
Lead Channel Pacing Threshold Pulse Width: 0.4 ms
Lead Channel Pacing Threshold Pulse Width: 0.4 ms
Lead Channel Sensing Intrinsic Amplitude: 2.375 mV
Lead Channel Sensing Intrinsic Amplitude: 2.625 mV
Lead Channel Sensing Intrinsic Amplitude: 5.875 mV
Lead Channel Sensing Intrinsic Amplitude: 7.25 mV
Lead Channel Setting Pacing Amplitude: 2 V
Lead Channel Setting Pacing Amplitude: 2.5 V
Lead Channel Setting Pacing Pulse Width: 0.4 ms
Lead Channel Setting Sensing Sensitivity: 0.3 mV

## 2018-08-20 MED ORDER — METOPROLOL SUCCINATE ER 50 MG PO TB24: 50.0000 mg | ORAL_TABLET | Freq: Two times a day (BID) | ORAL | 3 refills | Status: DC

## 2018-08-20 NOTE — Progress Notes (Signed)
Patient Care Team: [name], MD as PCP - General (Family Medicine) Sherren Mocha, MD as PCP - Cardiology (Cardiology)   HPI  [name] is a 85 y.o. male Seen today because of   recent bout of persistent atrial fibrillation which was quite symptomatic and prompted a visit to the A. fib clinic with the plan for anticoagulation and TEE cardioversion.  He had failed home shocks x2.  He converted then spontaneously.  He remains on Eliquis at this time      He has a history of polymorphic ventricular tachycardia for which he has an ICD   He was seen November 2013 because of 2 episodes of polymorphic ventricular tachycardia occurring in the preceding few weeks after having been quiet for months and months. There was no clear inciting event. Beta blocker therapy was gently uptitrated.  No intercurrent VT He has frequent PVCs  History of coronary disease with prior CABG mitral valve repair both of which were done at [facility]  PFO closure at that time.    DATE TEST EF   6/14 LHC 35-40% LIMA/ RIMA patent   2/17 Echo   30 %   12/18 LHC   LIMA-LAD and right radial graft-OM- patent proximal cx stenosis with negative FFR  11/19 Echo  35-40% LAE Severe (69/3.0/63) Pulm HTN 50       Date Cr K Mg Hgb  9/16  0.99 4.2 2.1   12/17  1.08 4.6 2.1   5/19 1.00 4.5 2.2   10/19 1.04 4.4  13.2 (11/19)            A few years ago he had an evaluation and was thought to have COPD.  He was treated with an inhaler with a significant improvement.     He was started on Eliquis as noted above.  He has complaints of dizziness which is been non-positional as well as double vision which was not clearly associated with rotation of the head.  There is also dyspnea; literature review suggested dyspnea could rarely be associated with Eliquis and he was changed to dabigatran. BNP 1465 (cutoff for age 6) failed to tolerate dabigatran secondary to GE reflux.  No changes were noted with his  dyspnea on dabigatran.  Resumption of apixaban without any changes in dyspnea  He has had persistent problems with atrial fibrillation over the last couple of weeks.  There has been an increased burden over the last couple of months as well, he wonders whether this is related to the change in dofetilide from proprietary to generic.  He is clearly aware of worsening dyspnea when he is out of rhythm.  Previously we had attempted to use ranolazine; he did not tolerate this. \  He fell at the [place] weekend most likely orthostatic syncope and had fracture of his right iliac crest.  He is under a great deal of stress as he and his wife are selling their home and moving in with her daughter.    No chest pain or edema, palpitations or syncope   Device History: STJ dual chamber ICD implanted 2004 for PMVT, ICM; generator change 2010; gen change 2016 (MDT device) History of appropriate therapy: Yes History of AAD therapy: Yes   Past Medical History:  Diagnosis Date  . AICD (automatic cardioverter/defibrillator) present    Medtronicn PPM/ICD  . Coronary artery disease    [facility]  . Diabetes mellitus   . Dysrhythmia   . Endocarditis, valve unspecified  Mitral  . Headache    hx migraines 6-7 x mo  . History of migraine headaches   . History of seasonal allergies   . ICD (implantable cardiac defibrillator) in place   . PAF (paroxysmal atrial fibrillation) ([place])    NO LAA occlusion at the time of surgery  [name] 2018  . Pleural effusion   . PVC's (premature ventricular contractions)   . Ventricular tachycardia, polymorphic ([place])    status post ICD implantation    Past Surgical History:  Procedure Laterality Date  . CARDIAC CATHETERIZATION  04/03/2007   EF 40%  . CARDIAC CATHETERIZATION  02/06/2006   EF 45%. ANTERIOR HYPOKINESIS  . CARDIAC CATHETERIZATION  05/29/2000   EF 50%. MILD ANTERIOR HYPOKINESIS  . CARDIAC CATHETERIZATION  10/25/1999   EF 50%.  SEVERE MITRAL REGURGITATION  . CARDIAC CATHETERIZATION  01/06/2003   EF 50%  . CARDIAC DEFIBRILLATOR PLACEMENT    . CATARACT EXTRACTION W/ INTRAOCULAR LENS  IMPLANT, BILATERAL    . CORONARY ARTERY BYPASS GRAFT  2001   2 VESSEL CABG AND MITRAL VALVE REPAIR. HE HAD A LIMA GRAFT TO THE LAD AND RADIAL ARTERY  GRAFT TO THE OBTUSE MARGINAL  OF LEFT CIRCUMFLEX  . EP IMPLANTABLE DEVICE N/A 11/06/2014   Procedure: ICD Generator Changeout;  Surgeon: Deboraha Sprang, MD;  Location: [facility];  Service: Cardiovascular;  Laterality: N/A;  . FOREIGN BODY REMOVAL Right 06/21/2017   Procedure: FOREIGN BODY REMOVAL ADULT RIGHT RING FINGER;  Surgeon: Leanora Cover, MD;  Location: [facility];  Service: Orthopedics;  Laterality: Right;  . HEMORRHOID SURGERY    . HEMORROIDECTOMY    . INTRAVASCULAR PRESSURE WIRE/FFR STUDY N/A 01/05/2017   Procedure: INTRAVASCULAR PRESSURE WIRE/FFR STUDY;  Surgeon: Sherren Mocha, MD;  Location: [place] [facility];  Service: Cardiovascular;  Laterality: N/A;  . KNEE ARTHROSCOPY     RIGHT KNEE  . LEFT HEART CATHETERIZATION WITH CORONARY ANGIOGRAM N/A 07/04/2012   Procedure: LEFT HEART CATHETERIZATION WITH CORONARY ANGIOGRAM;  Surgeon: Sherren Mocha, MD;  Location: [facility];  Service: Cardiovascular;  Laterality: N/A;  . MITRAL VALVE REPLACEMENT     No LAA occlusion at the time of surgery  Karie Soda report 2018  . RIGHT/LEFT HEART CATH AND CORONARY/GRAFT ANGIOGRAPHY N/A 01/05/2017   Procedure: RIGHT/LEFT HEART CATH AND CORONARY/GRAFT ANGIOGRAPHY;  Surgeon: Sherren Mocha, MD;  Location: [place] [facility];  Service: Cardiovascular;  Laterality: N/A;  . TRANSTHORACIC ECHOCARDIOGRAM  04/02/2007   EF 35%    Current Outpatient Medications  Medication Sig Dispense Refill  . acetaminophen (TYLENOL) 325 MG tablet Take 2 tablets (650 mg total) by mouth every 6 (six) hours as needed for mild pain (or Fever >/= 101).    Marland Kitchen atorvastatin (LIPITOR) 80 MG tablet Take 80 mg by mouth at bedtime.      . Cholecalciferol (VITAMIN D-3) 5000 UNITS TABS Take 5,000-10,000 Units by mouth See admin instructions. Take 5000 units by mouth on Sunday, Tuesday, Thursday, and Saturday. Take 10000 units by mouth on Monday, Wednesday, and Friday    . dofetilide ([name]) 500 MCG capsule Take 1 capsule (500 mcg total) by mouth 2 (two) times daily. 90 capsule 3  . ELIQUIS 5 MG TABS tablet TAKE ONE TABLET BY MOUTH TWICE DAILY 60 tablet 6  . EPIPEN 2-PAK 0.3 MG/0.3ML SOAJ injection Inject 0.3 mg into the muscle daily as needed for anaphylaxis. USE ONLY IN EMERGENCY    . Erenumab-aooe (AIMOVIG) 70 MG/ML SOAJ Inject 70 mg into  the skin every 30 (thirty) days. 1 pen 11  . fluticasone (FLONASE) 50 MCG/ACT nasal spray Place 1 spray into the nose daily as needed for rhinitis or allergies.    . furosemide (LASIX) 20 MG tablet Take 1 tablet (20 mg total) by mouth daily as needed for fluid or edema.    Marland Kitchen ibuprofen (ADVIL,MOTRIN) 200 MG tablet Take 400 mg by mouth every 8 (eight) hours as needed for headache or moderate pain.     . Magnesium 250 MG TABS Take 250 mg by mouth daily.     . metoprolol succinate (TOPROL-XL) 50 MG 24 hr tablet TAKE ONE TABLET BY MOUTH DAILY IMMEDIATELY FOLLOWING A MEAL 90 tablet 3  . Multiple Vitamin (MULTIVITAMIN WITH MINERALS) TABS Take 1 tablet by mouth daily. Centrum Silver    . Multiple Vitamins-Minerals (PRESERVISION AREDS 2) CAPS Take 1 capsule by mouth 2 (two) times daily.     . nateglinide (STARLIX) 120 MG tablet Take 120 mg by mouth Twice daily.     . pioglitazone (ACTOS) 45 MG tablet Take 45 mg by mouth daily.      Marland Kitchen Propylhexedrine (BENZEDREX) INHA Place 1 each into the nose daily as needed (for congestion).    . SF 5000 PLUS 1.1 % CREA dental cream Place 1 application onto teeth at bedtime.   6  . sitaGLIPtin (JANUVIA) 100 MG tablet Take 100 mg by mouth at bedtime.     . Tiotropium Bromide-Olodaterol (STIOLTO RESPIMAT) 2.5-2.5 MCG/ACT AERS Inhale 2 puffs into the lungs daily. 1  Inhaler 5   No current facility-administered medications for this visit.     Allergies  Allergen Reactions  . Latex Other (See Comments)    REDNESS AT REACTION SITE.  Marland Kitchen Metformin Diarrhea  . Rivaroxaban Other (See Comments)    Headaches, blurred vision  . Other Other (See Comments)    BEE STINGS   . Sotalol Other (See Comments)    "BLUE TOES"- cut his circulation off  . Warfarin Sodium Other (See Comments)    REACTION: migraine headaches/vision impariment    Review of Systems negative except from HPI and PMH  Physical Exam BP 118/76   Pulse 66   Ht 6\' 2"  (1.88 m)   Wt 213 lb (96.6 kg)   SpO2 96%   BMI 27.35 kg/m  Well developed and well nourished in no acute distress HENT normal Neck supple with JVP-flat Clear Device pocket well healed; without hematoma or erythema.  There is no tethering  Regular rate and rhythm, no  gallop No  murmur Abd-soft with active BS No Clubbing cyanosis  edema Skin-warm and dry A & Oriented  Grossly normal sensory and motor function  ECG *AV pacing at 66    Assessment and  Plan  Ischemic heart disease with prior bypass  Ischemic cardiomyopathy ejection 35-40%  Congestive heart failure-chronic-systolic  Implantable defibrillator-Medtronic  The patient's device was interrogated.  The information was reviewed.  Device was reprogrammed as below  PVCs   Atrial fibrillation persistent  Dofetilide therapy  Ventricular tachycardia nonsustained    COPD-reactive airways disease  Atrial fibrillation is certainly increased.  In the context of stress, it may be related to adrenergic stimulation; we will increase his metoprolol from 50--100 twice daily.  Alternatives would be amiodarone; he did not tolerate ranolazine.    Ongoing infrequent episodes of nonsustained ventricular tachycardia.  No ischemic chest pain.  No bleeding.  We spent more than 50% of our >25 min visit in face to  face counseling regarding the above

## 2018-08-20 NOTE — Patient Instructions (Addendum)
Medication Instructions:  Your physician has recommended you make the following change in your medication:   1. Increase your metoprolol succinate to 50mg  tablet, two times per day   Labwork: Your physician recommends that you return for lab work in:   CBC, BMP, Mg  Testing/Procedures: None ordered.  Follow-Up:  You have a follow up appt with Dr Caryl Comes scheduled for Sept 23 @ 1:30pm with Dr Caryl Comes.  Please come a few minutes early to have lab work drawn.  Any Other Special Instructions Will Be Listed Below (If Applicable).     If you need a refill on your cardiac medications before your next appointment, please call your pharmacy.

## 2018-08-21 DIAGNOSIS — H35372 Puckering of macula, left eye: Secondary | ICD-10-CM | POA: Diagnosis not present

## 2018-08-21 DIAGNOSIS — H43813 Vitreous degeneration, bilateral: Secondary | ICD-10-CM | POA: Diagnosis not present

## 2018-08-21 DIAGNOSIS — H35043 Retinal micro-aneurysms, unspecified, bilateral: Secondary | ICD-10-CM | POA: Diagnosis not present

## 2018-08-21 DIAGNOSIS — E113393 Type 2 diabetes mellitus with moderate nonproliferative diabetic retinopathy without macular edema, bilateral: Secondary | ICD-10-CM | POA: Diagnosis not present

## 2018-09-13 ENCOUNTER — Telehealth: Payer: Self-pay | Admitting: Neurology

## 2018-09-13 NOTE — Progress Notes (Signed)
Initiated on Cover My Meds Key: [id number] on 09/12/18  Your information has been submitted to [employer]. [employer] [place] will review the request and notify you of the determination decision directly, typically within 3 business days of your submission and once all necessary information is received. You will also receive your request decision electronically. To check for an update later, open the request again from your dashboard. If [employer] [place] has not responded within the specified timeframe or if you have any questions about your PA submission, contact [place] directly at [facility]) [phone] or ([place]) [phone]

## 2018-09-13 NOTE — Progress Notes (Signed)
rcvd call from [employer], [place]. Aimovig approved Effective from 09/12/2018 through 09/12/2019.  Is also on Cover My Meds

## 2018-09-13 NOTE — Telephone Encounter (Signed)
Pharm calling in needing PA for the patient's aimovig medication. Thanks!

## 2018-09-13 NOTE — Telephone Encounter (Signed)
Called and LMOVM advising PA has been submitted and waiting on a determination from insurance

## 2018-09-26 DIAGNOSIS — M25561 Pain in right knee: Secondary | ICD-10-CM | POA: Diagnosis not present

## 2018-09-26 DIAGNOSIS — G8929 Other chronic pain: Secondary | ICD-10-CM | POA: Diagnosis not present

## 2018-09-27 ENCOUNTER — Ambulatory Visit: Payer: Medicare Other | Admitting: Vascular Surgery

## 2018-10-11 DIAGNOSIS — Z23 Encounter for immunization: Secondary | ICD-10-CM | POA: Diagnosis not present

## 2018-10-14 ENCOUNTER — Other Ambulatory Visit (HOSPITAL_COMMUNITY): Payer: Self-pay | Admitting: Internal Medicine

## 2018-10-14 NOTE — Telephone Encounter (Signed)
17m 96.6kg Scr 0.88 03/07/18 Lovw/klein 08/20/18

## 2018-10-15 DIAGNOSIS — L570 Actinic keratosis: Secondary | ICD-10-CM | POA: Diagnosis not present

## 2018-10-15 DIAGNOSIS — D1801 Hemangioma of skin and subcutaneous tissue: Secondary | ICD-10-CM | POA: Diagnosis not present

## 2018-10-15 DIAGNOSIS — L821 Other seborrheic keratosis: Secondary | ICD-10-CM | POA: Diagnosis not present

## 2018-10-15 DIAGNOSIS — D485 Neoplasm of uncertain behavior of skin: Secondary | ICD-10-CM | POA: Diagnosis not present

## 2018-10-15 DIAGNOSIS — C44712 Basal cell carcinoma of skin of right lower limb, including hip: Secondary | ICD-10-CM | POA: Diagnosis not present

## 2018-10-15 DIAGNOSIS — D692 Other nonthrombocytopenic purpura: Secondary | ICD-10-CM | POA: Diagnosis not present

## 2018-10-21 ENCOUNTER — Other Ambulatory Visit: Payer: Self-pay | Admitting: Internal Medicine

## 2018-10-23 ENCOUNTER — Other Ambulatory Visit: Payer: Self-pay

## 2018-10-23 ENCOUNTER — Ambulatory Visit (INDEPENDENT_AMBULATORY_CARE_PROVIDER_SITE_OTHER): Payer: Medicare Other | Admitting: Internal Medicine

## 2018-10-23 ENCOUNTER — Encounter: Payer: Self-pay | Admitting: Internal Medicine

## 2018-10-23 ENCOUNTER — Other Ambulatory Visit: Payer: Medicare Other | Admitting: *Deleted

## 2018-10-23 VITALS — BP 98/56 | HR 86 | Ht 74.0 in | Wt 216.2 lb

## 2018-10-23 DIAGNOSIS — I25119 Atherosclerotic heart disease of native coronary artery with unspecified angina pectoris: Secondary | ICD-10-CM

## 2018-10-23 DIAGNOSIS — I255 Ischemic cardiomyopathy: Secondary | ICD-10-CM

## 2018-10-23 DIAGNOSIS — Z79899 Other long term (current) drug therapy: Secondary | ICD-10-CM | POA: Diagnosis not present

## 2018-10-23 DIAGNOSIS — I5022 Chronic systolic (congestive) heart failure: Secondary | ICD-10-CM

## 2018-10-23 DIAGNOSIS — Z95810 Presence of automatic (implantable) cardiac defibrillator: Secondary | ICD-10-CM

## 2018-10-23 DIAGNOSIS — I480 Paroxysmal atrial fibrillation: Secondary | ICD-10-CM

## 2018-10-23 MED ORDER — APIXABAN 5 MG PO TABS: 5.0000 mg | ORAL_TABLET | Freq: Two times a day (BID) | ORAL | 1 refills | Status: DC

## 2018-10-23 MED ORDER — AMIODARONE HCL 200 MG PO TABS: ORAL_TABLET | ORAL | 6 refills | Status: DC

## 2018-10-23 MED ORDER — METOPROLOL SUCCINATE ER 50 MG PO TB24: 50.0000 mg | ORAL_TABLET | Freq: Two times a day (BID) | ORAL | 3 refills | Status: DC

## 2018-10-23 NOTE — Patient Instructions (Addendum)
Medication Instructions:  Your physician has recommended you make the following change in your medication:  1. STOP [name] 2. START Amiodarone Saturday 9/26 -  Take 1 tablet (200 mg total) TWICE daily for one month, then reduce &  Take 1 tablet (200 mg total) ONCE daily  * If you need a refill on your cardiac medications before your next appointment, please call your pharmacy.   Labwork: Today: TSH & LFTs  Your physician recommends that you return for lab work in: 6 weeks for follow up labs:  CMET  If you have labs (blood work) drawn today and your tests are completely normal, you will receive your results only by:  [place] (if you have MyChart) OR  A paper copy in the mail If you have any lab test that is abnormal or we need to change your treatment, we will call you to review the results.  Testing/Procedures: None ordered  Follow-Up: At [facility], you and your health needs are our priority.  As part of our continuing mission to provide you with exceptional heart care, we have created designated Provider Care Teams.  These Care Teams include your primary Cardiologist (physician) and Advanced Practice Providers (APPs -  Physician Assistants and Nurse Practitioners) who all work together to provide you with the care you need, when you need it.  You will need a follow up appointment in 3 months.  Please call our office 2 months in advance to schedule this appointment.  You may see Dr Caryl Comes or one of the following Advanced Practice Providers on your designated Care Team:    Chanetta Marshall, NP  Tommye Standard, PA-C  Oda Kilts, [place]   Thank you for choosing [facility]!!    Any Other Special Instructions Will Be Listed Below (If Applicable).  Amiodarone tablets What is this medicine? AMIODARONE (a MEE oh da rone) is an antiarrhythmic drug. It helps make your heart beat regularly. Because of the side effects caused by this medicine, it is only used when other  medicines have not worked. It is usually used for heartbeat problems that may be life threatening. This medicine may be used for other purposes; ask your health care provider or pharmacist if you have questions. COMMON BRAND NAME(S): Cordarone, Pacerone What should I tell my health care provider before I take this medicine? They need to know if you have any of these conditions:  liver disease  lung disease  other heart problems  thyroid disease  an unusual or allergic reaction to amiodarone, iodine, other medicines, foods, dyes, or preservatives  pregnant or trying to get pregnant  breast-feeding How should I use this medicine? Take this medicine by mouth with a glass of water. Follow the directions on the prescription label. You can take this medicine with or without food. However, you should always take it the same way each time. Take your doses at regular intervals. Do not take your medicine more often than directed. Do not stop taking except on the advice of your doctor or health care professional. A special MedGuide will be given to you by the pharmacist with each prescription and refill. Be sure to read this information carefully each time. Talk to your pediatrician regarding the use of this medicine in children. Special care may be needed. Overdosage: If you think you have taken too much of this medicine contact a poison control center or emergency room at once. NOTE: This medicine is only for you. Do not share this medicine with others.  What if I miss a dose? If you miss a dose, take it as soon as you can. If it is almost time for your next dose, take only that dose. Do not take double or extra doses. What may interact with this medicine? Do not take this medicine with any of the following medications:  abarelix  apomorphine  arsenic trioxide  certain antibiotics like erythromycin, gemifloxacin, levofloxacin, pentamidine  certain medicines for depression like amoxapine,  tricyclic antidepressants  certain medicines for fungal infections like fluconazole, itraconazole, ketoconazole, posaconazole, voriconazole  certain medicines for irregular heart beat like disopyramide, dronedarone, ibutilide, propafenone, sotalol  certain medicines for malaria like chloroquine, halofantrine  cisapride  droperidol  haloperidol  haw[place]orn  maprotiline  methadone  phenothiazines like chlorpromazine, mesoridazine, thioridazine  pimozide  ranolazine  red yeast rice  vardenafil This medicine may also interact with the following medications:  antiviral medicines for HIV or AIDS  certain medicines for blood pressure, heart disease, irregular heart beat  certain medicines for cholesterol like atorvastatin, cerivastatin, lovastatin, simvastatin  certain medicines for hepatitis C like sofosbuvir and ledipasvir; sofosbuvir  certain medicines for seizures like phenytoin  certain medicines for thyroid problems  certain medicines that treat or prevent blood clots like warfarin  cholestyramine  cimetidine  clopidogrel  cyclosporine  dextromethorphan  diuretics  dofetilide  fentanyl  general anesthetics  grapefruit juice  lidocaine  loratadine  methotrexate  other medicines that prolong the QT interval (cause an abnormal heart rhythm)  procainamide  quinidine  rifabutin, rifampin, or rifapentine  St. John's Wort  trazodone  ziprasidone This list may not describe all possible interactions. Give your health care provider a list of all the medicines, herbs, non-prescription drugs, or dietary supplements you use. Also tell them if you smoke, drink alcohol, or use illegal drugs. Some items may interact with your medicine. What should I watch for while using this medicine? Your condition will be monitored closely when you first begin therapy. Often, this drug is first started in a hospital or other monitored health care setting. Once you  are on maintenance therapy, visit your doctor or health care professional for regular checks on your progress. Because your condition and use of this medicine carry some risk, it is a good idea to carry an identification card, necklace or bracelet with details of your condition, medications, and doctor or health care professional. Dennis Bast may get drowsy or dizzy. Do not drive, use machinery, or do anything that needs mental alertness until you know how this medicine affects you. Do not stand or sit up quickly, especially if you are an older patient. This reduces the risk of dizzy or fainting spells. This medicine can make you more sensitive to the sun. Keep out of the sun. If you cannot avoid being in the sun, wear protective clothing and use sunscreen. Do not use sun lamps or tanning beds/booths. You should have regular eye exams before and during treatment. Call your doctor if you have blurred vision, see halos, or your eyes become sensitive to light. Your eyes may get dry. It may be helpful to use a lubricating eye solution or artificial tears solution. If you are going to have surgery or a procedure that requires contrast dyes, tell your doctor or health care professional that you are taking this medicine. What side effects may I notice from receiving this medicine? Side effects that you should report to your doctor or health care professional as soon as possible:  allergic reactions like skin rash,  itching or hives, swelling of the face, lips, or tongue  blue-gray coloring of the skin  blurred vision, seeing blue green halos, increased sensitivity of the eyes to light  breathing problems  chest pain  dark urine  fast, irregular heartbeat  feeling faint or light-headed  intolerance to heat or cold  nausea or vomiting  pain and swelling of the scrotum  pain, tingling, numbness in feet, hands  redness, blistering, peeling or loosening of the skin, including inside the mouth  spitting  up blood  stomach pain  sweating  unusual or uncontrolled movements of body  unusually weak or tired  weight gain or loss  yellowing of the eyes or skin Side effects that usually do not require medical attention (report to your doctor or health care professional if they continue or are bothersome):  change in sex drive or performance  constipation  dizziness  headache  loss of appetite  trouble sleeping This list may not describe all possible side effects. Call your doctor for medical advice about side effects. You may report side effects to FDA at 1-800-FDA-1088. Where should I keep my medicine? Keep out of the reach of children. Store at room temperature between 20 and 25 degrees C (68 and 77 degrees F). Protect from light. Keep container tightly closed. Throw away any unused medicine after the expiration date. NOTE: This sheet is a summary. It may not cover all possible information. If you have questions about this medicine, talk to your doctor, pharmacist, or health care provider.  2020 Elsevier/Gold Standard (2017-12-19 13:44:04)

## 2018-10-23 NOTE — Progress Notes (Signed)
Patient Care Team: [name], MD as PCP - General (Family Medicine) Sherren Mocha, MD as PCP - Cardiology (Cardiology)   HPI  [name] is a 85 y.o. male Seen in followup for polymorphic ventricular tachycardia for which he has an ICD as well as persistent atrial fibrillation which was quite symptomatic and prompted a visit to the A. fib clinic with the plan for anticoagulation and TEE cardioversion.  He had failed home shocks x2.  There has been an increased burden of Afib He converted then spontaneously.  He remains on Eliquis at this time    Metoprolol was increased but without effect  AFib assoc with slowish Ventricular response but with dyspnea on exertion and fatigue.  He is quite aware of when he is in A. fib and when he is not.  Antiarrhythmics Date Reason stopped  dofetilide 9/20 ineffective  Amiodarone / Tried years ago following CABG stopped but pt does not recall adverse effects    History of coronary disease with prior CABG mitral valve repair both of which were done at [facility]  PFO closure at that time.    DATE TEST EF   6/14 LHC 35-40% LIMA/ RIMA patent   2/17 Echo   30 %   12/18 LHC   LIMA-LAD and right radial graft-OM- patent proximal cx stenosis with negative FFR  11/19 Echo  35-40% LAE Severe (69/3.0/63) Pulm HTN 50       Date Cr K Mg Hgb  9/16  0.99 4.2 2.1   12/17  1.08 4.6 2.1   5/19 1.00 4.5 2.2   10/19 1.04 4.4  13.2 (11/19)            He was started on Eliquis as noted above.  He has complaints of dizziness which is been non-positional as well as double vision which was not clearly associated with rotation of the head.  There is also dyspnea; literature review suggested dyspnea could rarely be associated with Eliquis and he was changed to dabigatran. BNP 1465 (cutoff for age 56) failed to tolerate dabigatran secondary to GE reflux.  No changes were noted with his dyspnea on dabigatran.  Resumption of apixaban without any changes  in dyspnea     He is under a great deal of stress as he and his wife are selling their home and moving in with her daughter.        Device History: STJ dual chamber ICD implanted 2004 for PMVT, ICM; generator change 2010; gen change 2016 (MDT device) History of appropriate therapy: Yes History of AAD therapy: Yes   Past Medical History:  Diagnosis Date  . AICD (automatic cardioverter/defibrillator) present    Medtronicn PPM/ICD  . [facility]ary artery disease    [facility]  . Diabetes mellitus   . Dysrhythmia   . Endocarditis, valve unspecified    Mitral  . Headache    hx migraines 6-7 x mo  . History of migraine headaches   . History of seasonal allergies   . ICD (implantable cardiac defibrillator) in place   . PAF (paroxysmal atrial fibrillation) ([place])    NO LAA occlusion at the time of surgery  [name] 2018  . Pleural effusion   . PVC's (premature ventricular contractions)   . Ventricular tachycardia, polymorphic ([place])    status post ICD implantation    Past Surgical History:  Procedure Laterality Date  . CARDIAC CATHETERIZATION  04/03/2007   EF 40%  . CARDIAC CATHETERIZATION  02/06/2006  EF 45%. ANTERIOR HYPOKINESIS  . CARDIAC CATHETERIZATION  05/29/2000   EF 50%. MILD ANTERIOR HYPOKINESIS  . CARDIAC CATHETERIZATION  10/25/1999   EF 50%. SEVERE MITRAL REGURGITATION  . CARDIAC CATHETERIZATION  01/06/2003   EF 50%  . CARDIAC DEFIBRILLATOR PLACEMENT    . CATARACT EXTRACTION W/ INTRAOCULAR LENS  IMPLANT, BILATERAL    . CORONARY ARTERY BYPASS GRAFT  2001   2 VESSEL CABG AND MITRAL VALVE REPAIR. HE HAD A LIMA GRAFT TO THE LAD AND RADIAL ARTERY  GRAFT TO THE OBTUSE MARGINAL  OF LEFT CIRCUMFLEX  . EP IMPLANTABLE DEVICE N/A 11/06/2014   Procedure: ICD Generator Changeout;  Surgeon: Deboraha Sprang, MD;  Location: [facility];  Service: Cardiovascular;  Laterality: N/A;  . FOREIGN BODY REMOVAL Right 06/21/2017   Procedure: FOREIGN BODY REMOVAL ADULT  RIGHT RING FINGER;  Surgeon: Leanora Cover, MD;  Location: [facility];  Service: Orthopedics;  Laterality: Right;  . HEMORRHOID SURGERY    . HEMORROIDECTOMY    . INTRAVASCULAR PRESSURE WIRE/FFR STUDY N/A 01/05/2017   Procedure: INTRAVASCULAR PRESSURE WIRE/FFR STUDY;  Surgeon: Sherren Mocha, MD;  Location: [place] [facility];  Service: Cardiovascular;  Laterality: N/A;  . KNEE ARTHROSCOPY     RIGHT KNEE  . LEFT HEART CATHETERIZATION WITH CORONARY ANGIOGRAM N/A 07/04/2012   Procedure: LEFT HEART CATHETERIZATION WITH CORONARY ANGIOGRAM;  Surgeon: Sherren Mocha, MD;  Location: [facility];  Service: Cardiovascular;  Laterality: N/A;  . MITRAL VALVE REPLACEMENT     No LAA occlusion at the time of surgery  Karie Soda report 2018  . RIGHT/LEFT HEART CATH AND CORONARY/GRAFT ANGIOGRAPHY N/A 01/05/2017   Procedure: RIGHT/LEFT HEART CATH AND CORONARY/GRAFT ANGIOGRAPHY;  Surgeon: Sherren Mocha, MD;  Location: [facility];  Service: Cardiovascular;  Laterality: N/A;  . TRANSTHORACIC ECHOCARDIOGRAM  04/02/2007   EF 35%    Current Outpatient Medications  Medication Sig Dispense Refill  . acetaminophen (TYLENOL) 325 MG tablet Take 2 tablets (650 mg total) by mouth every 6 (six) hours as needed for mild pain (or Fever >/= 101).    Marland Kitchen apixaban (ELIQUIS) 5 MG TABS tablet Take 1 tablet (5 mg total) by mouth 2 (two) times daily. 180 tablet 1  . atorvastatin (LIPITOR) 80 MG tablet Take 80 mg by mouth at bedtime.     . Cholecalciferol (VITAMIN D-3) 5000 UNITS TABS Take 5,000-10,000 Units by mouth See admin instructions. Take 5000 units by mouth on Sunday, Tuesday, Thursday, and Saturday. Take 10000 units by mouth on Monday, Wednesday, and Friday    . dofetilide ([name]) 500 MCG capsule Take 1 capsule (500 mcg total) by mouth 2 (two) times daily. 90 capsule 0  . EPIPEN 2-PAK 0.3 MG/0.3ML SOAJ injection Inject 0.3 mg into the muscle daily as needed for anaphylaxis. USE ONLY IN EMERGENCY    . fluticasone (FLONASE) 50  MCG/ACT nasal spray Place 1 spray into the nose daily as needed for rhinitis or allergies.    . furosemide (LASIX) 20 MG tablet Take 1 tablet (20 mg total) by mouth daily as needed for fluid or edema.    Marland Kitchen ibuprofen (ADVIL,MOTRIN) 200 MG tablet Take 400 mg by mouth every 8 (eight) hours as needed for headache or moderate pain.     . Magnesium 250 MG TABS Take 250 mg by mouth daily.     . metoprolol succinate (TOPROL-XL) 50 MG 24 hr tablet Take 1 tablet (50 mg total) by mouth 2 (two) times daily. Take with or immediately following  a meal. 180 tablet 3  . Multiple Vitamin (MULTIVITAMIN WITH MINERALS) TABS Take 1 tablet by mouth daily. Centrum Silver    . Multiple Vitamins-Minerals (PRESERVISION AREDS 2) CAPS Take 1 capsule by mouth 2 (two) times daily.     . nateglinide (STARLIX) 120 MG tablet Take 120 mg by mouth Twice daily.     . pioglitazone (ACTOS) 45 MG tablet Take 45 mg by mouth daily.      Marland Kitchen Propylhexedrine (BENZEDREX) INHA Place 1 each into the nose daily as needed (for congestion).    . sitaGLIPtin (JANUVIA) 100 MG tablet Take 100 mg by mouth at bedtime.     . Tiotropium Bromide-Olodaterol (STIOLTO RESPIMAT) 2.5-2.5 MCG/ACT AERS Inhale 2 puffs into the lungs daily. 1 Inhaler 5   No current facility-administered medications for this visit.     Allergies  Allergen Reactions  . Latex Other (See Comments)    REDNESS AT REACTION SITE.  Marland Kitchen Metformin Diarrhea  . Rivaroxaban Other (See Comments)    Headaches, blurred vision  . Other Other (See Comments)    BEE STINGS   . Sotalol Other (See Comments)    "BLUE TOES"- cut his circulation off  . Warfarin Sodium Other (See Comments)    REACTION: migraine headaches/vision impariment    Review of Systems negative except from HPI and PMH  Physical Exam BP (!) 98/56   Pulse 86   Ht 6\' 2"  (1.88 m)   Wt 216 lb 3.2 oz (98.1 kg)   SpO2 98%   BMI 27.76 kg/m  Well developed and nourished in no acute distress HENT normal Neck supple with  JVP-flat Carotids brisk and full without bruits Clear Device pocket well healed; without hematoma or erythema.  There is no tethering  Irregularly irregular rate and rhythm with controlled ventricular response, no murmurs or gallops Abd-soft with active BS without hepatomegaly No Clubbing cyanosis edema Skin-warm and dry A & Oriented  Grossly normal sensory and motor function  ECG demonstrates atrial fibrillation with ventricular pacing. Intervals-18/46 QRS RS lead V1    Assessment and  Plan  Ischemic heart disease with prior bypass  Ischemic cardiomyopathy ejection 35-40%  Congestive heart failure-chronic-systolic  Implantable defibrillator-Medtronic dual chamber with LV port plugged.   The patient's device was interrogated.  The information was reviewed.  Device was reprogrammed as below  PVCs   Atrial fibrillation persistent  Dofetilide therapy  Ventricular tachycardia nonsustained    COPD-reactive airways disease  Without symptoms of ischemia  On Anticoagulation;  No bleeding issues   Euvolemic continue current meds  With persistence of atrial fibrillation Increasing atrial fibrillation burden we have discussed alternative therapies to dofetilide.  He recalls having been put on amiodarone.  I went back through the notes that I could find in epic to 2003 could not find it.  He then recalled that he had been post bypass surgery.  We have discussed benefits and potential side effects.  We will begin amiodarone.  He has had multiple episodes of reverted to sinus rhythm so we will not anticipate cardioversion.  We will plan to see him in about 2 months.  We spent more than 50% of our >25 min visit in face to face counseling regarding the above  We spent more than 50% of our >25 min visit in face to face counseling regarding the above

## 2018-10-24 LAB — CBC
Hematocrit: 39.3 % (ref 37.5–51.0)
Hemoglobin: 13.4 g/dL (ref 13.0–17.7)
MCH: 34 pg — ABNORMAL HIGH (ref 26.6–33.0)
MCHC: 34.1 g/dL (ref 31.5–35.7)
MCV: 100 fL — ABNORMAL HIGH (ref 79–97)
Platelets: 130 10*3/uL — ABNORMAL LOW (ref 150–450)
RBC: 3.94 x10E6/uL — ABNORMAL LOW (ref 4.14–5.80)
RDW: 12.4 % (ref 11.6–15.4)
WBC: 5.4 10*3/uL (ref 3.4–10.8)

## 2018-10-24 LAB — BASIC METABOLIC PANEL
BUN/Creatinine Ratio: 15 (ref 10–24)
BUN: 16 mg/dL (ref 8–27)
CO2: 26 mmol/L (ref 20–29)
Calcium: 9.2 mg/dL (ref 8.6–10.2)
Chloride: 98 mmol/L (ref 96–106)
Creatinine, Ser: 1.06 mg/dL (ref 0.76–1.27)
GFR calc Af Amer: 74 mL/min/{1.73_m2} (ref >59)
GFR calc non Af Amer: 64 mL/min/{1.73_m2} (ref >59)
Glucose: 159 mg/dL — ABNORMAL HIGH (ref 65–99)
Potassium: 4.8 mmol/L (ref 3.5–5.2)
Sodium: 135 mmol/L (ref 134–144)

## 2018-10-24 LAB — MAGNESIUM: Magnesium: 2 mg/dL (ref 1.6–2.3)

## 2018-10-24 LAB — HEPATIC FUNCTION PANEL
ALT: 25 IU/L (ref 0–44)
AST: 32 IU/L (ref 0–40)
Albumin: 4.4 g/dL (ref 3.6–4.6)
Alkaline Phosphatase: 119 IU/L — ABNORMAL HIGH (ref 39–117)
Bilirubin Total: 1.4 mg/dL — ABNORMAL HIGH (ref 0.0–1.2)
Bilirubin, Direct: 0.42 mg/dL — ABNORMAL HIGH (ref 0.00–0.40)
Total Protein: 6.9 g/dL (ref 6.0–8.5)

## 2018-10-24 LAB — TSH: TSH: 1.57 u[IU]/mL (ref 0.450–4.500)

## 2018-10-25 LAB — CUP PACEART INCLINIC DEVICE CHECK
Battery Remaining Longevity: 37 mo
Battery Voltage: 2.95 V
Brady Statistic AP VP Percent: 38.55 %
Brady Statistic AP VS Percent: 0.87 %
Brady Statistic AS VP Percent: 44.79 %
Brady Statistic AS VS Percent: 15.79 %
Brady Statistic RA Percent Paced: 34.83 %
Brady Statistic RV Percent Paced: 83.37 %
Date Time Interrogation Session: 20200923165036
HighPow Impedance: 37 Ohm
HighPow Impedance: 53 Ohm
Implantable Lead Implant Date: 20040309
Implantable Lead Implant Date: 20040309
Implantable Lead Location: 753859
Implantable Lead Location: 753860
Implantable Lead Model: 158
Implantable Lead Model: 5076
Implantable Lead Serial Number: 115061
Implantable Pulse Generator Implant Date: 20161007
Lead Channel Impedance Value: 399 Ohm
Lead Channel Impedance Value: 4047 Ohm
Lead Channel Impedance Value: 4047 Ohm
Lead Channel Impedance Value: 4047 Ohm
Lead Channel Impedance Value: 4047 Ohm
Lead Channel Impedance Value: 4047 Ohm
Lead Channel Impedance Value: 4047 Ohm
Lead Channel Impedance Value: 4047 Ohm
Lead Channel Impedance Value: 4047 Ohm
Lead Channel Impedance Value: 4047 Ohm
Lead Channel Impedance Value: 4047 Ohm
Lead Channel Impedance Value: 418 Ohm
Lead Channel Impedance Value: 456 Ohm
Lead Channel Pacing Threshold Amplitude: 0.5 V
Lead Channel Pacing Threshold Pulse Width: 0.4 ms
Lead Channel Sensing Intrinsic Amplitude: 0.875 mV
Lead Channel Sensing Intrinsic Amplitude: 6.625 mV
Lead Channel Setting Pacing Amplitude: 2 V
Lead Channel Setting Pacing Amplitude: 2.5 V
Lead Channel Setting Pacing Pulse Width: 0.4 ms
Lead Channel Setting Sensing Sensitivity: 0.3 mV

## 2018-11-04 ENCOUNTER — Other Ambulatory Visit: Payer: Self-pay

## 2018-11-04 ENCOUNTER — Emergency Department (HOSPITAL_COMMUNITY): Payer: Medicare Other

## 2018-11-04 ENCOUNTER — Encounter (HOSPITAL_COMMUNITY): Payer: Self-pay | Admitting: Internal Medicine

## 2018-11-04 ENCOUNTER — Inpatient Hospital Stay (HOSPITAL_COMMUNITY)
Admission: EM | Admit: 2018-11-04 | Discharge: 2018-11-08 | DRG: 026 | Disposition: A | Discharge status: Home / Self Care | Payer: Medicare Other | Source: Ambulatory Visit | Attending: Internal Medicine | Admitting: Internal Medicine

## 2018-11-04 DIAGNOSIS — H53131 Sudden visual loss, right eye: Secondary | ICD-10-CM | POA: Diagnosis not present

## 2018-11-04 DIAGNOSIS — E871 Hypo-osmolality and hyponatremia: Secondary | ICD-10-CM | POA: Diagnosis not present

## 2018-11-04 DIAGNOSIS — H3411 Central retinal artery occlusion, right eye: Secondary | ICD-10-CM

## 2018-11-04 DIAGNOSIS — Z951 Presence of aortocoronary bypass graft: Secondary | ICD-10-CM

## 2018-11-04 DIAGNOSIS — I255 Ischemic cardiomyopathy: Secondary | ICD-10-CM | POA: Diagnosis present

## 2018-11-04 DIAGNOSIS — G459 Transient cerebral ischemic attack, unspecified: Secondary | ICD-10-CM | POA: Diagnosis not present

## 2018-11-04 DIAGNOSIS — I2510 Atherosclerotic heart disease of native coronary artery without angina pectoris: Secondary | ICD-10-CM | POA: Diagnosis present

## 2018-11-04 DIAGNOSIS — I110 Hypertensive heart disease with heart failure: Secondary | ICD-10-CM | POA: Diagnosis present

## 2018-11-04 DIAGNOSIS — E118 Type 2 diabetes mellitus with unspecified complications: Secondary | ICD-10-CM

## 2018-11-04 DIAGNOSIS — I4891 Unspecified atrial fibrillation: Secondary | ICD-10-CM | POA: Diagnosis present

## 2018-11-04 DIAGNOSIS — I052 Rheumatic mitral stenosis with insufficiency: Secondary | ICD-10-CM | POA: Diagnosis present

## 2018-11-04 DIAGNOSIS — H538 Other visual disturbances: Secondary | ICD-10-CM | POA: Diagnosis not present

## 2018-11-04 DIAGNOSIS — I480 Paroxysmal atrial fibrillation: Secondary | ICD-10-CM | POA: Diagnosis present

## 2018-11-04 DIAGNOSIS — I6521 Occlusion and stenosis of right carotid artery: Secondary | ICD-10-CM | POA: Diagnosis not present

## 2018-11-04 DIAGNOSIS — I5042 Chronic combined systolic (congestive) and diastolic (congestive) heart failure: Secondary | ICD-10-CM | POA: Diagnosis not present

## 2018-11-04 DIAGNOSIS — Z03818 Encounter for observation for suspected exposure to other biological agents ruled out: Secondary | ICD-10-CM | POA: Diagnosis not present

## 2018-11-04 DIAGNOSIS — I472 Ventricular tachycardia: Secondary | ICD-10-CM | POA: Diagnosis present

## 2018-11-04 DIAGNOSIS — J302 Other seasonal allergic rhinitis: Secondary | ICD-10-CM | POA: Diagnosis present

## 2018-11-04 DIAGNOSIS — J449 Chronic obstructive pulmonary disease, unspecified: Secondary | ICD-10-CM | POA: Diagnosis not present

## 2018-11-04 DIAGNOSIS — Z825 Family history of asthma and other chronic lower respiratory diseases: Secondary | ICD-10-CM

## 2018-11-04 DIAGNOSIS — Z8679 Personal history of other diseases of the circulatory system: Secondary | ICD-10-CM

## 2018-11-04 DIAGNOSIS — Z9841 Cataract extraction status, right eye: Secondary | ICD-10-CM

## 2018-11-04 DIAGNOSIS — Z833 Family history of diabetes mellitus: Secondary | ICD-10-CM

## 2018-11-04 DIAGNOSIS — Z79899 Other long term (current) drug therapy: Secondary | ICD-10-CM

## 2018-11-04 DIAGNOSIS — I4819 Other persistent atrial fibrillation: Secondary | ICD-10-CM | POA: Diagnosis present

## 2018-11-04 DIAGNOSIS — Z952 Presence of prosthetic heart valve: Secondary | ICD-10-CM

## 2018-11-04 DIAGNOSIS — Z8774 Personal history of (corrected) congenital malformations of heart and circulatory system: Secondary | ICD-10-CM

## 2018-11-04 DIAGNOSIS — Z888 Allergy status to other drugs, medicaments and biological substances status: Secondary | ICD-10-CM

## 2018-11-04 DIAGNOSIS — Z8673 Personal history of transient ischemic attack (TIA), and cerebral infarction without residual deficits: Secondary | ICD-10-CM

## 2018-11-04 DIAGNOSIS — H43813 Vitreous degeneration, bilateral: Secondary | ICD-10-CM | POA: Diagnosis not present

## 2018-11-04 DIAGNOSIS — I4811 Longstanding persistent atrial fibrillation: Secondary | ICD-10-CM | POA: Diagnosis not present

## 2018-11-04 DIAGNOSIS — E113393 Type 2 diabetes mellitus with moderate nonproliferative diabetic retinopathy without macular edema, bilateral: Secondary | ICD-10-CM | POA: Diagnosis not present

## 2018-11-04 DIAGNOSIS — I700 Atherosclerosis of aorta: Secondary | ICD-10-CM | POA: Diagnosis present

## 2018-11-04 DIAGNOSIS — H35043 Retinal micro-aneurysms, unspecified, bilateral: Secondary | ICD-10-CM | POA: Diagnosis not present

## 2018-11-04 DIAGNOSIS — Z7982 Long term (current) use of aspirin: Secondary | ICD-10-CM

## 2018-11-04 DIAGNOSIS — Z9842 Cataract extraction status, left eye: Secondary | ICD-10-CM

## 2018-11-04 DIAGNOSIS — H5461 Unqualified visual loss, right eye, normal vision left eye: Secondary | ICD-10-CM | POA: Diagnosis not present

## 2018-11-04 DIAGNOSIS — Z7901 Long term (current) use of anticoagulants: Secondary | ICD-10-CM

## 2018-11-04 DIAGNOSIS — Z823 Family history of stroke: Secondary | ICD-10-CM

## 2018-11-04 DIAGNOSIS — I4821 Permanent atrial fibrillation: Secondary | ICD-10-CM

## 2018-11-04 DIAGNOSIS — Z7951 Long term (current) use of inhaled steroids: Secondary | ICD-10-CM

## 2018-11-04 DIAGNOSIS — Z95810 Presence of automatic (implantable) cardiac defibrillator: Secondary | ICD-10-CM | POA: Diagnosis present

## 2018-11-04 DIAGNOSIS — Z961 Presence of intraocular lens: Secondary | ICD-10-CM | POA: Diagnosis present

## 2018-11-04 DIAGNOSIS — Z20828 Contact with and (suspected) exposure to other viral communicable diseases: Secondary | ICD-10-CM | POA: Diagnosis not present

## 2018-11-04 DIAGNOSIS — G453 Amaurosis fugax: Secondary | ICD-10-CM | POA: Diagnosis present

## 2018-11-04 DIAGNOSIS — Z8249 Family history of ischemic heart disease and other diseases of the circulatory system: Secondary | ICD-10-CM

## 2018-11-04 DIAGNOSIS — K0889 Other specified disorders of teeth and supporting structures: Secondary | ICD-10-CM | POA: Diagnosis present

## 2018-11-04 DIAGNOSIS — E785 Hyperlipidemia, unspecified: Secondary | ICD-10-CM | POA: Diagnosis present

## 2018-11-04 DIAGNOSIS — J4489 Other specified chronic obstructive pulmonary disease: Secondary | ICD-10-CM | POA: Diagnosis present

## 2018-11-04 DIAGNOSIS — Z87891 Personal history of nicotine dependence: Secondary | ICD-10-CM

## 2018-11-04 DIAGNOSIS — E119 Type 2 diabetes mellitus without complications: Secondary | ICD-10-CM | POA: Diagnosis present

## 2018-11-04 DIAGNOSIS — I4510 Unspecified right bundle-branch block: Secondary | ICD-10-CM | POA: Diagnosis present

## 2018-11-04 DIAGNOSIS — Z91040 Latex allergy status: Secondary | ICD-10-CM

## 2018-11-04 DIAGNOSIS — Z01810 Encounter for preprocedural cardiovascular examination: Secondary | ICD-10-CM

## 2018-11-04 DIAGNOSIS — Z7984 Long term (current) use of oral hypoglycemic drugs: Secondary | ICD-10-CM

## 2018-11-04 LAB — CBC
HCT: 41.2 % (ref 39.0–52.0)
Hemoglobin: 14.1 g/dL (ref 13.0–17.0)
MCH: 35.3 pg — ABNORMAL HIGH (ref 26.0–34.0)
MCHC: 34.2 g/dL (ref 30.0–36.0)
MCV: 103 fL — ABNORMAL HIGH (ref 80.0–100.0)
Platelets: 124 10*3/uL — ABNORMAL LOW (ref 150–400)
RBC: 4 MIL/uL — ABNORMAL LOW (ref 4.22–5.81)
RDW: 12.9 % (ref 11.5–15.5)
WBC: 5.6 10*3/uL (ref 4.0–10.5)
nRBC: 0 % (ref 0.0–0.2)

## 2018-11-04 LAB — I-STAT CHEM 8, ED
BUN: 14 mg/dL (ref 8–23)
Calcium, Ion: 1.22 mmol/L (ref 1.15–1.40)
Chloride: 98 mmol/L (ref 98–111)
Creatinine, Ser: 0.9 mg/dL (ref 0.61–1.24)
Glucose, Bld: 128 mg/dL — ABNORMAL HIGH (ref 70–99)
HCT: 43 % (ref 39.0–52.0)
Hemoglobin: 14.6 g/dL (ref 13.0–17.0)
Potassium: 4.5 mmol/L (ref 3.5–5.1)
Sodium: 133 mmol/L — ABNORMAL LOW (ref 135–145)
TCO2: 23 mmol/L (ref 22–32)

## 2018-11-04 LAB — COMPREHENSIVE METABOLIC PANEL
ALT: 23 U/L (ref 0–44)
AST: 27 U/L (ref 15–41)
Albumin: 4.3 g/dL (ref 3.5–5.0)
Alkaline Phosphatase: 89 U/L (ref 38–126)
Anion gap: 8 (ref 5–15)
BUN: 12 mg/dL (ref 8–23)
CO2: 23 mmol/L (ref 22–32)
Calcium: 9.2 mg/dL (ref 8.9–10.3)
Chloride: 99 mmol/L (ref 98–111)
Creatinine, Ser: 1 mg/dL (ref 0.61–1.24)
GFR calc Af Amer: >60 mL/min (ref >60)
GFR calc non Af Amer: >60 mL/min (ref >60)
Glucose, Bld: 133 mg/dL — ABNORMAL HIGH (ref 70–99)
Potassium: 4.6 mmol/L (ref 3.5–5.1)
Sodium: 130 mmol/L — ABNORMAL LOW (ref 135–145)
Total Bilirubin: 2.5 mg/dL — ABNORMAL HIGH (ref 0.3–1.2)
Total Protein: 7.5 g/dL (ref 6.5–8.1)

## 2018-11-04 LAB — DIFFERENTIAL
Abs Immature Granulocytes: 0.02 10*3/uL (ref 0.00–0.07)
Basophils Absolute: 0 10*3/uL (ref 0.0–0.1)
Basophils Relative: 1 %
Eosinophils Absolute: 0.3 10*3/uL (ref 0.0–0.5)
Eosinophils Relative: 5 %
Immature Granulocytes: 0 %
Lymphocytes Relative: 11 %
Lymphs Abs: 0.6 10*3/uL — ABNORMAL LOW (ref 0.7–4.0)
Monocytes Absolute: 0.7 10*3/uL (ref 0.1–1.0)
Monocytes Relative: 12 %
Neutro Abs: 4 10*3/uL (ref 1.7–7.7)
Neutrophils Relative %: 71 %

## 2018-11-04 LAB — RAPID URINE DRUG SCREEN, HOSP PERFORMED
Amphetamines: NOT DETECTED
Barbiturates: NOT DETECTED
Benzodiazepines: NOT DETECTED
Cocaine: NOT DETECTED
Opiates: NOT DETECTED
Tetrahydrocannabinol: NOT DETECTED

## 2018-11-04 LAB — URINALYSIS, ROUTINE W REFLEX MICROSCOPIC
Bilirubin Urine: NEGATIVE
Glucose, UA: 50 mg/dL — AB
Hgb urine dipstick: NEGATIVE
Ketones, ur: NEGATIVE mg/dL
Leukocytes,Ua: NEGATIVE
Nitrite: NEGATIVE
Protein, ur: NEGATIVE mg/dL
Specific Gravity, Urine: 1.009 (ref 1.005–1.030)
pH: 6 (ref 5.0–8.0)

## 2018-11-04 LAB — APTT: aPTT: 35 seconds (ref 24–36)

## 2018-11-04 LAB — HEMOGLOBIN A1C
Hgb A1c MFr Bld: 6.8 % — ABNORMAL HIGH (ref 4.8–5.6)
Mean Plasma Glucose: 148.46 mg/dL

## 2018-11-04 LAB — ETHANOL: Alcohol, Ethyl (B): <10 mg/dL (ref <10)

## 2018-11-04 LAB — PROTIME-INR
INR: 1.4 — ABNORMAL HIGH (ref 0.8–1.2)
Prothrombin Time: 16.7 seconds — ABNORMAL HIGH (ref 11.4–15.2)

## 2018-11-04 MED ORDER — IOHEXOL 350 MG/ML SOLN
80.0000 mL | Freq: Once | INTRAVENOUS | Status: AC | PRN
  Administered 2018-11-04: 80 mL via INTRAVENOUS

## 2018-11-04 MED ORDER — INSULIN ASPART 100 UNIT/ML ~~LOC~~ SOLN
0.0000 [IU] | Freq: Three times a day (TID) | SUBCUTANEOUS | Status: DC
  Administered 2018-11-05: 3 [IU] via SUBCUTANEOUS
  Administered 2018-11-06 – 2018-11-08 (×3): 2 [IU] via SUBCUTANEOUS

## 2018-11-04 MED ORDER — INSULIN ASPART 100 UNIT/ML ~~LOC~~ SOLN
0.0000 [IU] | Freq: Every day | SUBCUTANEOUS | Status: DC
  Administered 2018-11-07: 2 [IU] via SUBCUTANEOUS

## 2018-11-04 NOTE — H&P (Signed)
[name]:[id number] DOB: 06-Jan-1932 DOA: 11/04/2018     PCP: Joycelyn Rua, MD   Outpatient Specialists:  CARDS:  Dr. Graciela Husbands    Patient arrived to ER on 11/04/18 at 1553  Patient coming from: home Lives  With family    Chief Complaint:   Chief Complaint  Patient presents with  . Blurred Vision    HPI: [name] is a 85 y.o. male with medical history significant of history of CAD status post AICD placed for history of polymorphic V. tach, DM 2, endocarditis of mitral valve, paroxysmal atrial fibrillation,    Presented with   acute loss of vision in the right eye happened twice.  Friday then again yesterday patient presented to ophthalmologist office and was told that he probably has a stroke and presented to emergency department.  Otherwise he have not had any other neurological abnormalities no weakness or numbness no trouble with speech or swallowing no vertigo no ataxia.  No prior history of strokes.  Denies any chest pain or shortness of breath no headaches no fevers no chills    Infectious risk factors:  Reports none   In  ER RAPID COVID TEST  in house testing  Pending  No results found for: SARSCOV2NAA   Regarding pertinent Chronic problems:     Hyperlipidemia -  on statins Lipitor   HTN on Toprol   CHF diastolic/systolic/ combined - last echo Feb 2020 mild-moderately reduced systolic function of 40-45%. Severely dilated left atrial size.    CAD  - On Aspirin, statin, betablocker,                  - *followed by cardiology                - last cardiac cath 2009   DM 2 -  Lab Results  Component Value Date   HGBA1C 6.1 (H) 06/20/2017   PO meds only,      A. Fib -  - CHA2DS2 vas score 3 :  current  on anticoagulation with Eliquis,          -  Rate control:  Currently controlled with  Toprolol,            - Rhythm control:   amiodarone,       While in ER:  The following Work up has been ordered so far:  Orders Placed This  Encounter  Procedures  . SARS CORONAVIRUS 2 (TAT 6-24 HRS) Nasopharyngeal Nasopharyngeal Swab  . CT HEAD WO CONTRAST  . CT Angio Head W or Wo Contrast  . CT Angio Neck W and/or Wo Contrast  . Ethanol  . Protime-INR  . APTT  . CBC  . Differential  . Comprehensive metabolic panel  . Urine rapid drug screen (hosp performed)  . Urinalysis, Routine w reflex microscopic  . Diet NPO time specified  . Vital signs  . Cardiac Monitoring  . Modified Stroke Scale (mNIHSS) Document mNIHSS assessment every 2 hours for a total of 12 hours  . Stroke swallow screen  . Consult to Neuro Hospitalist  ALL PATIENTS BEING ADMITTED/HAVING PROCEDURES NEED COVID-19 SCREENING  . Consult to hospitalist  ALL PATIENTS BEING ADMITTED/HAVING PROCEDURES NEED COVID-19 SCREENING  . Pulse oximetry, continuous  . I-stat chem 8, ED  . ED EKG  . EKG 12-Lead     Following Medications were ordered in ER: Medications - No data to display      Consult Orders  (From admission,  onward)         Start     Ordered   11/04/18 2212  Consult to hospitalist  ALL PATIENTS BEING ADMITTED/HAVING PROCEDURES NEED COVID-19 SCREENING  Once    Comments: ALL PATIENTS BEING ADMITTED/HAVING PROCEDURES NEED COVID-19 SCREENING  Provider:  (Not yet assigned)  Question Answer Comment  Place call to: [facility]   Reason for Consult Admit      11/04/18 2211   11/04/18 2204  Consult to Neuro Hospitalist  ALL PATIENTS BEING ADMITTED/HAVING PROCEDURES NEED COVID-19 SCREENING  Once    Comments: ALL PATIENTS BEING ADMITTED/HAVING PROCEDURES NEED COVID-19 SCREENING  Provider:  (Not yet assigned)  Question Answer Comment  Place call to: Neuro Hospitalist on call   Reason for Consult Admit      11/04/18 2203          ER Provider Called: Neurology    Dr.  . Otelia Limes   They Recommend admit to medicine    Will see   in ER   Significant initial  Findings: Abnormal Labs Reviewed  PROTIME-INR - Abnormal; Notable for the  following components:      Result Value   Prothrombin Time 16.7 (*)    INR 1.4 (*)    All other components within normal limits  CBC - Abnormal; Notable for the following components:   RBC 4.00 (*)    MCV 103.0 (*)    MCH 35.3 (*)    Platelets 124 (*)    All other components within normal limits  DIFFERENTIAL - Abnormal; Notable for the following components:   Lymphs Abs 0.6 (*)    All other components within normal limits  COMPREHENSIVE METABOLIC PANEL - Abnormal; Notable for the following components:   Sodium 130 (*)    Glucose, Bld 133 (*)    Total Bilirubin 2.5 (*)    All other components within normal limits  URINALYSIS, ROUTINE W REFLEX MICROSCOPIC - Abnormal; Notable for the following components:   Glucose, UA 50 (*)    All other components within normal limits  I-STAT CHEM 8, ED - Abnormal; Notable for the following components:   Sodium 133 (*)    Glucose, Bld 128 (*)    All other components within normal limits     Otherwise labs showing:    Recent Labs  Lab 11/04/18 1635 11/04/18 1711  NA 130* 133*  K 4.6 4.5  CO2 23  --   GLUCOSE 133* 128*  BUN 12 14  CREATININE 1.00 0.90  CALCIUM 9.2  --     Cr   Stable,  Lab Results  Component Value Date   CREATININE 0.90 11/04/2018   CREATININE 1.00 11/04/2018   CREATININE 1.06 10/23/2018    Recent Labs  Lab 11/04/18 1635  AST 27  ALT 23  ALKPHOS 89  BILITOT 2.5*  PROT 7.5  ALBUMIN 4.3   Lab Results  Component Value Date   CALCIUM 9.2 11/04/2018      WBC      Component Value Date/Time   WBC 5.6 11/04/2018 1635   ANC    Component Value Date/Time   NEUTROABS 4.0 11/04/2018 1635   NEUTROABS 3.8 12/27/2016 1523   ALC No components found for: LYMPHAB    Plt: Lab Results  Component Value Date   PLT 124 (L) 11/04/2018    Lactic Acid, Venous No results found for: LATICACIDVEN    COVID-19 Labs  No results for input(s): DDIMER, FERRITIN, LDH, CRP in the last 72 hours.  No results found  for: SARSCOV2NAA   HG/HCT   Stable,     Component Value Date/Time   HGB 14.6 11/04/2018 1711   HGB 13.4 10/23/2018 1318   HCT 43.0 11/04/2018 1711   HCT 39.3 10/23/2018 1318      ECG: Ordered Personally reviewed by me showing: HR : 62 Rhythm:  A.fib.  LBBB,   no evidence of ischemic changes QTC 530   BNP (last 3 results) Recent Labs    03/04/18 0030  BNP 219.8*    ProBNP (last 3 results) Recent Labs    12/18/17 1506  PROBNP 1,465*    DM  labs:  HbA1C: No results for input(s): HGBA1C in the last 8760 hours.      UA   no evidence of UTI       Urine analysis:    Component Value Date/Time   COLORURINE YELLOW 11/04/2018 1955   APPEARANCEUR CLEAR 11/04/2018 1955   LABSPEC 1.009 11/04/2018 1955   PHURINE 6.0 11/04/2018 1955   GLUCOSEU 50 (A) 11/04/2018 1955   HGBUR NEGATIVE 11/04/2018 1955   BILIRUBINUR NEGATIVE 11/04/2018 1955   KETONESUR NEGATIVE 11/04/2018 1955   PROTEINUR NEGATIVE 11/04/2018 1955   NITRITE NEGATIVE 11/04/2018 1955   LEUKOCYTESUR NEGATIVE 11/04/2018 1955     CT HEAD NON acute CTA head - Ordered  CXR - cardiomegaly      ED Triage Vitals [11/04/18 1906]  Enc Vitals Group     BP (!) 143/81     Pulse Rate 70     Resp 18     Temp 97.9 F (36.6 C)     Temp Source Oral     SpO2 100 %     Weight      Height      Head Circumference      Peak Flow      Pain Score      Pain Loc      Pain Edu?      Excl. in GC?   ZOXW(96)@       Latest  Blood pressure 133/83, pulse 66, temperature 97.9 F (36.6 C), temperature source Oral, resp. rate 16, SpO2 97 %.    Hospitalist was called for admission for TIA vs CVA   Review of Systems:    Pertinent positives include: loss of vision  Constitutional:  No weight loss, night sweats, Fevers, chills, fatigue, weight loss  HEENT:  No headaches, Difficulty swallowing,Tooth/dental problems,Sore throat,  No sneezing, itching, ear ache, nasal congestion, post nasal drip,  Cardio-vascular:  No  chest pain, Orthopnea, PND, anasarca, dizziness, palpitations.no Bilateral lower extremity swelling  GI:  No heartburn, indigestion, abdominal pain, nausea, vomiting, diarrhea, change in bowel habits, loss of appetite, melena, blood in stool, hematemesis Resp:  no shortness of breath at rest. No dyspnea on exertion, No excess mucus, no productive cough, No non-productive cough, No coughing up of blood.No change in color of mucus.No wheezing. Skin:  no rash or lesions. No jaundice GU:  no dysuria, change in color of urine, no urgency or frequency. No straining to urinate.  No flank pain.  Musculoskeletal:  No joint pain or no joint swelling. No decreased range of motion. No back pain.  Psych:  No change in mood or affect. No depression or anxiety. No memory loss.  Neuro: no localizing neurological complaints, no tingling, no weakness, no double vision, no gait abnormality, no slurred speech, no confusion  All systems reviewed and apart from HOPI all are negative  Past  Medical History:   Past Medical History:  Diagnosis Date  . AICD (automatic cardioverter/defibrillator) present    [employer] PPM/ICD  . Coronary artery disease    [facility]  . Diabetes mellitus   . Dysrhythmia   . Endocarditis, valve unspecified    Mitral  . Headache    hx migraines 6-7 x mo  . History of migraine headaches   . History of seasonal allergies   . ICD (implantable cardiac defibrillator) in place   . PAF (paroxysmal atrial fibrillation) (HCC)    NO LAA occlusion at the time of surgery  [name] 2018  . Pleural effusion   . PVC's (premature ventricular contractions)   . Ventricular tachycardia, polymorphic (HCC)    status post ICD implantation      Past Surgical History:  Procedure Laterality Date  . CARDIAC CATHETERIZATION  04/03/2007   EF 40%  . CARDIAC CATHETERIZATION  02/06/2006   EF 45%. ANTERIOR HYPOKINESIS  . CARDIAC CATHETERIZATION  05/29/2000   EF 50%. MILD  ANTERIOR HYPOKINESIS  . CARDIAC CATHETERIZATION  10/25/1999   EF 50%. SEVERE MITRAL REGURGITATION  . CARDIAC CATHETERIZATION  01/06/2003   EF 50%  . CARDIAC DEFIBRILLATOR PLACEMENT    . CATARACT EXTRACTION W/ INTRAOCULAR LENS  IMPLANT, BILATERAL    . CORONARY ARTERY BYPASS GRAFT  2001   2 VESSEL CABG AND MITRAL VALVE REPAIR. HE HAD A LIMA GRAFT TO THE LAD AND RADIAL ARTERY  GRAFT TO THE OBTUSE MARGINAL  OF LEFT CIRCUMFLEX  . EP IMPLANTABLE DEVICE N/A 11/06/2014   Procedure: ICD Generator Changeout;  Surgeon: Duke Salvia, MD;  Location: [facility] INVASIVE CV LAB;  Service: Cardiovascular;  Laterality: N/A;  . FOREIGN BODY REMOVAL Right 06/21/2017   Procedure: FOREIGN BODY REMOVAL ADULT RIGHT RING FINGER;  Surgeon: Betha Loa, MD;  Location: [facility] OR;  Service: Orthopedics;  Laterality: Right;  . HEMORRHOID SURGERY    . HEMORROIDECTOMY    . INTRAVASCULAR PRESSURE WIRE/FFR STUDY N/A 01/05/2017   Procedure: INTRAVASCULAR PRESSURE WIRE/FFR STUDY;  Surgeon: [name], MD;  Location: [facility] INVASIVE CV LAB;  Service: Cardiovascular;  Laterality: N/A;  . KNEE ARTHROSCOPY     RIGHT KNEE  . LEFT HEART CATHETERIZATION WITH CORONARY ANGIOGRAM N/A 07/04/2012   Procedure: LEFT HEART CATHETERIZATION WITH CORONARY ANGIOGRAM;  Surgeon: [name], MD;  Location: [facility] CATH LAB;  Service: Cardiovascular;  Laterality: N/A;  . MITRAL VALVE REPLACEMENT     No LAA occlusion at the time of surgery  Tamela Oddi report 2018  . RIGHT/LEFT HEART CATH AND CORONARY/GRAFT ANGIOGRAPHY N/A 01/05/2017   Procedure: RIGHT/LEFT HEART CATH AND CORONARY/GRAFT ANGIOGRAPHY;  Surgeon: [name], MD;  Location[facility] INVASIVE CV LAB;  Service: Cardiovascular;  Laterality: N/A;  . TRANSTHORACIC ECHOCARDIOGRAM  04/02/2007   EF 35%    Social History:  Ambulatory   independently      reports that he quit smoking about 47 years ago. His smoking use included cigarettes. He started smoking about 62 years ago. He has a 32.00 pack-year smoking  history. He has never used smokeless tobacco. He reports current alcohol use. He reports that he does not use drugs.   Family History:   Family History  Problem Relation Age of Onset  . Heart disease Father   . Emphysema Father   . Stroke Mother   . Cancer Sister        breast cancer  . Heart disease Paternal Grandmother   . Heart disease Paternal Grandfather   .  Diabetes Other        grandmother, aunt, uncle  . Cancer Other        lung cancer, aunt    Allergies: Allergies  Allergen Reactions  . Latex Other (See Comments)    REDNESS AT REACTION SITE.  Marland Kitchen Metformin Diarrhea  . Rivaroxaban Other (See Comments)    Headaches, blurred vision  . Other Other (See Comments)    BEE STINGS   . Sotalol Other (See Comments)    "BLUE TOES"- cut his circulation off  . Warfarin Sodium Other (See Comments)    REACTION: migraine headaches/vision impariment     Prior to Admission medications   Medication Sig Start Date End Date Taking? Authorizing Provider  acetaminophen (TYLENOL) 325 MG tablet Take 2 tablets (650 mg total) by mouth every 6 (six) hours as needed for mild pain (or Fever >/= 101). 03/07/18   Zannie Cove, MD  amiodarone (PACERONE) 200 MG tablet Take 1 tablet (200 mg total) TWICE daily for one month, then reduce and take 1 tablet (200 mg daily) ONCE daily 10/26/18   Duke Salvia, MD  apixaban (ELIQUIS) 5 MG TABS tablet Take 1 tablet (5 mg total) by mouth 2 (two) times daily. 10/23/18   Duke Salvia, MD  atorvastatin (LIPITOR) 80 MG tablet Take 80 mg by mouth at bedtime.  07/12/10   [provider]  Cholecalciferol (VITAMIN D-3) 5000 UNITS TABS Take 5,000-10,000 Units by mouth See admin instructions. Take 5000 units by mouth on Sunday, Tuesday, Thursday, and Saturday. Take 02542 units by mouth on Monday, Wednesday, and Friday    [provider]  EPIPEN 2-PAK 0.3 MG/0.3ML SOAJ injection Inject 0.3 mg into the muscle daily as needed for anaphylaxis. USE ONLY  IN EMERGENCY 08/12/12   [provider]  fluticasone (FLONASE) 50 MCG/ACT nasal spray Place 1 spray into the nose daily as needed for rhinitis or allergies.    [provider]  furosemide (LASIX) 20 MG tablet Take 1 tablet (20 mg total) by mouth daily as needed for fluid or edema. 03/07/18   Zannie Cove, MD  ibuprofen (ADVIL,MOTRIN) 200 MG tablet Take 400 mg by mouth every 8 (eight) hours as needed for headache or moderate pain.     [provider]  Magnesium 250 MG TABS Take 250 mg by mouth daily.     [provider]  metoprolol succinate (TOPROL-XL) 50 MG 24 hr tablet Take 1 tablet (50 mg total) by mouth 2 (two) times daily. Take with or immediately following a meal. 10/23/18   Duke Salvia, MD  Multiple Vitamin (MULTIVITAMIN WITH MINERALS) TABS Take 1 tablet by mouth daily. Centrum Silver    [provider]  Multiple Vitamins-Minerals (PRESERVISION AREDS 2) CAPS Take 1 capsule by mouth 2 (two) times daily.     [provider]  nateglinide (STARLIX) 120 MG tablet Take 120 mg by mouth Twice daily.  07/12/10   [provider]  pioglitazone (ACTOS) 45 MG tablet Take 45 mg by mouth daily.      [provider]  Propylhexedrine Arlys John) INHA Place 1 each into the nose daily as needed (for congestion).    [provider]  sitaGLIPtin (JANUVIA) 100 MG tablet Take 100 mg by mouth at bedtime.     [provider]  Tiotropium Bromide-Olodaterol (STIOLTO RESPIMAT) 2.5-2.5 MCG/ACT AERS Inhale 2 puffs into the lungs daily. 04/09/18   Coral Ceo, NP   Physical Exam: Blood pressure 133/83, pulse 66, temperature  97.9 F (36.6 C), temperature source Oral, resp. rate 16, SpO2 97 %. 1. General:  in No  Acute distress   Chronically ill  -appearing 2. Psychological: Alert and  Oriented 3. Head/ENT:     Dry Mucous Membranes                          Head Non traumatic, neck supple                           Poor Dentition  4. SKIN:   decreased Skin turgor,  Skin clean Dry and intact no rash 5. Heart: Regular rate and rhythm cardiac Murmur, no Rub or gallop 6. Lungs:  Clear to auscultation bilaterally, no wheezes or crackles   7. Abdomen: Soft,  non-tender, Non distended  obese  bowel sounds present 8. Lower extremities: no clubbing, cyanosis, no  edema 9. Neurologically   strength 5 out of 5 in all 4 extremities cranial nerves II through XII intact Right pupil dilated recently has had ophthalmology appoint and had dialation 10. MSK: Normal range of motion   All other LABS:     Recent Labs  Lab 11/04/18 1635 11/04/18 1711  WBC 5.6  --   NEUTROABS 4.0  --   HGB 14.1 14.6  HCT 41.2 43.0  MCV 103.0*  --   PLT 124*  --      Recent Labs  Lab 11/04/18 1635 11/04/18 1711  NA 130* 133*  K 4.6 4.5  CL 99 98  CO2 23  --   GLUCOSE 133* 128*  BUN 12 14  CREATININE 1.00 0.90  CALCIUM 9.2  --      Recent Labs  Lab 11/04/18 1635  AST 27  ALT 23  ALKPHOS 89  BILITOT 2.5*  PROT 7.5  ALBUMIN 4.3       Cultures:    Component Value Date/Time   SDES WOUND RIGHT FINGER 06/21/2017 0916   SDES WOUND RIGHT FINGER 06/21/2017 0916   SPECREQUEST SWAB SPEC A 06/21/2017 0916   SPECREQUEST SWAB SPEC A 06/21/2017 0916   CULT  06/21/2017 0916    NO ANAEROBES ISOLATED Performed at [facility], [address], [place], [place] [address]    CULT  06/21/2017 [phone]    FEW MYCOBACTERIUM ABSCESSUS Performed at [facility] Performed at [facility], [address] [place], [place] [address]    REPTSTATUS 06/26/2017 FINAL 06/21/2017 1324   REPTSTATUS 07/06/2017 FINAL 06/21/2017 0916     Radiological Exams on Admission: Ct Head Wo Contrast  Result Date: 11/04/2018 CLINICAL DATA:  Right eye blurred vision. Transient loss of right eye vision 2 days ago. History of atrial fibrillation. EXAM: CT HEAD WITHOUT CONTRAST TECHNIQUE: Contiguous axial images were obtained from the base of the  skull through the vertex without intravenous contrast. COMPARISON:  CT head 03/04/2018. FINDINGS: Brain: There is no evidence of acute intracranial hemorrhage, mass lesion, brain edema or extra-axial fluid collection. The ventricles and subarachnoid spaces are appropriately sized for age. Mild chronic small vessel ischemic changes in the periventricular white matter are stable. There is no evidence of acute cortical stroke. Vascular: Intracranial vascular calcifications. No hyperdense vessel identified. Skull: Negative for fracture or focal lesion. Sinuses/Orbits: Minimal ethmoid sinus mucosal thickening and small bilateral mastoid effusions without coalescence. No orbital abnormalities. Other: None. IMPRESSION: Stable head CT without acute findings or explanation for the patient's symptoms. Stable  mild chronic small vessel ischemic changes. Electronically Signed   By: Carey Bullocks M.D.   On: 11/04/2018 17:09    Chart has been reviewed    Assessment/Plan  85 y.o. male with medical history significant of history of CAD status post AICD placed for history of polymorphic V. tach, DM 2, endocarditis of mitral valve, paroxysmal atrial fibrillation,     Admitted for TIA  Present on Admission: . TIA (transient ischemic attack) -  - will admit based on TIA/CVA protocol,        Monitor on Tele       CTA   ordered, Carotid Doppler in AM        Echo to evaluate for possible embolic source,        obtain cardiac enzymes,  ECG,   Lipid panel, TSH.        Order PT/OT evaluation.        Keep nothing by mouth until passes swallow eval        Will make sure patient is on antiplatelet ASA 81          Allow permissive Hypertension keep BP <220/120        Neurology consulted Have seen pt in ER   . ATRIAL FIBRILLATION -          - CHA2DS2 vas score 6 : continue current anticoagulation with   Eliquis,         -  Rate control:  Currently controlled with   Toprolol,  Continue in AM        - Rhythm control:   Continue amiodarone  . Asthma-COPD overlap syndrome (HCC) chronic stable continue to monitor we will continue home medications  . Type 2 diabetes mellitus with complication, without long-term current use of insulin (HCC) -  - Order Sensitive  SSI   -  check TSH and HgA1C  - Hold by mouth medications    . CAD (coronary artery disease) - chronic stable  Hx of CHF  -   hold home diuretics for tonight and restart when appears euvolemic, carefuly follow fluid status and Cr   Other plan as per orders.  DVT prophylaxis:  Eliquis  Code Status:  FULL CODE  as per patient  I had personally discussed CODE STATUS with patient   Family Communication:   Family not at  Bedside    Disposition Plan:       To home once workup is complete and patient is stable                      Would benefit from PT/OT eval prior to DC  Ordered                                      Consults called: neurology aware   Admission status:  ED Disposition    None      Obs    Level of care    tele   indefinitely please discontinue once patient no longer qualifies  Precautions:   No active isolations  PPE: Used by the provider:   P100  eye Goggles,  Gloves     Ronit Marczak 11/04/2018, 11:43 PM    [facility]     after 2 AM please page floor coverage PA If 7AM-7PM, please contact the day team taking care of the patient using [email or web address]

## 2018-11-04 NOTE — ED Notes (Signed)
Patient transported to CT 

## 2018-11-04 NOTE — ED Triage Notes (Signed)
Pt was seen by eye MD today and was referred to ED. Pt presents w/ burred right eye yesterday. Pt states he had a total loss of vision in right eye on Saturday which returned after 10 mins, per pt. Pt states he had blurred vision again yesterday. Pt has a hx of a-fib and is currently taking eliquis. Pt denies headache. No weakness noted at time of triage.

## 2018-11-04 NOTE — ED Provider Notes (Signed)
[facility] EMERGENCY DEPARTMENT Provider Note   CSN: [id number] Arrival date & time: 11/04/18  1553     History   Chief Complaint Chief Complaint  Patient presents with   Blurred Vision    [name] is a 85 y.o. male.     85 yo M with chief complaints of loss of vision in his right eye.  Happened twice now.  First on Friday and then yesterday.  He saw his ophthalmologist in the office today who told him that he thinks that he had a stroke and was sent to the emergency department for evaluation.  Patient denies unilateral numbness or weakness denies difficulty with speech swallowing denies dizziness denies difficulty with ambulation.  No history of strokes.  The history is provided by the patient.  Illness Severity:  Moderate Onset quality:  Gradual Duration:  2 days Timing:  Constant Progression:  Worsening Chronicity:  New Associated symptoms: no abdominal pain, no chest pain, no congestion, no diarrhea, no fever, no headaches, no myalgias, no rash, no shortness of breath and no vomiting     Past Medical History:  Diagnosis Date   AICD (automatic cardioverter/defibrillator) present    [employer] PPM/ICD   Coronary artery disease    [facility]   Diabetes mellitus    Dysrhythmia    Endocarditis, valve unspecified    Mitral   Headache    hx migraines 6-7 x mo   History of migraine headaches    History of seasonal allergies    ICD (implantable cardiac defibrillator) in place    PAF (paroxysmal atrial fibrillation) (HCC)    NO LAA occlusion at the time of surgery  [name] 2018   Pleural effusion    PVC's (premature ventricular contractions)    Ventricular tachycardia, polymorphic (HCC)    status post ICD implantation    Patient Active Problem List   Diagnosis Date Noted   Closed fracture of right iliac crest ([place])    Syncope 03/04/2018   Pelvic fracture ([place]) 03/04/2018   Asthma-COPD  overlap syndrome ([place]) 02/22/2018   Cough 02/22/2018   Orthostatic presyncope 08/01/2010   Coronary artery disease with exertional angina ([place]) 08/01/2010   Ventricular tachycardia, polymorphic ([place])    ATRIAL FIBRILLATION 06/15/2008   PREMATURE VENTRICULAR CONTRACTIONS 04/23/2008   RAYNAUD'S DISEASE 04/23/2008   Automatic implantable cardioverter-defibrillator in situ 04/23/2008    Past Surgical History:  Procedure Laterality Date   CARDIAC CATHETERIZATION  04/03/2007   EF 40%   CARDIAC CATHETERIZATION  02/06/2006   EF 45%. ANTERIOR HYPOKINESIS   CARDIAC CATHETERIZATION  05/29/2000   EF 50%. MILD ANTERIOR HYPOKINESIS   CARDIAC CATHETERIZATION  10/25/1999   EF 50%. SEVERE MITRAL REGURGITATION   CARDIAC CATHETERIZATION  01/06/2003   EF 50%   CARDIAC DEFIBRILLATOR PLACEMENT     CATARACT EXTRACTION W/ INTRAOCULAR LENS  IMPLANT, BILATERAL     CORONARY ARTERY BYPASS GRAFT  2001   2 VESSEL CABG AND MITRAL VALVE REPAIR. HE HAD A LIMA GRAFT TO THE LAD AND RADIAL ARTERY  GRAFT TO THE OBTUSE MARGINAL  OF LEFT CIRCUMFLEX   EP IMPLANTABLE DEVICE N/A 11/06/2014   Procedure: ICD Generator Changeout;  Surgeon: Deboraha Sprang, MD;  Location: [place] [facility];  Service: Cardiovascular;  Laterality: N/A;   FOREIGN BODY REMOVAL Right 06/21/2017   Procedure: FOREIGN BODY REMOVAL ADULT RIGHT RING FINGER;  Surgeon: Leanora Cover, MD;  Location: [place];  Service: Orthopedics;  Laterality: Right;  HEMORRHOID SURGERY     HEMORROIDECTOMY     INTRAVASCULAR PRESSURE WIRE/FFR STUDY N/A 01/05/2017   Procedure: INTRAVASCULAR PRESSURE WIRE/FFR STUDY;  Surgeon: Sherren Mocha, MD;  Location: [place] [facility];  Service: Cardiovascular;  Laterality: N/A;   KNEE ARTHROSCOPY     RIGHT KNEE   LEFT HEART CATHETERIZATION WITH CORONARY ANGIOGRAM N/A 07/04/2012   Procedure: LEFT HEART CATHETERIZATION WITH CORONARY ANGIOGRAM;  Surgeon: Sherren Mocha, MD;  Location: [facility];  Service: Cardiovascular;   Laterality: N/A;   MITRAL VALVE REPLACEMENT     No LAA occlusion at the time of surgery  Karie Soda report 2018   RIGHT/LEFT HEART CATH AND CORONARY/GRAFT ANGIOGRAPHY N/A 01/05/2017   Procedure: RIGHT/LEFT HEART CATH AND CORONARY/GRAFT ANGIOGRAPHY;  Surgeon: Sherren Mocha, MD;  Location: [place] [facility];  Service: Cardiovascular;  Laterality: N/A;   TRANSTHORACIC ECHOCARDIOGRAM  04/02/2007   EF 35%        Home Medications    Prior to Admission medications   Medication Sig Start Date End Date Taking? Authorizing Provider  acetaminophen (TYLENOL) 325 MG tablet Take 2 tablets (650 mg total) by mouth every 6 (six) hours as needed for mild pain (or Fever >/= 101). 03/07/18   [name], MD  amiodarone (PACERONE) 200 MG tablet Take 1 tablet (200 mg total) TWICE daily for one month, then reduce and take 1 tablet (200 mg daily) ONCE daily 10/26/18   Deboraha Sprang, MD  apixaban (ELIQUIS) 5 MG TABS tablet Take 1 tablet (5 mg total) by mouth 2 (two) times daily. 10/23/18   Deboraha Sprang, MD  atorvastatin (LIPITOR) 80 MG tablet Take 80 mg by mouth at bedtime.  07/12/10   [provider]  Cholecalciferol (VITAMIN D-3) 5000 UNITS TABS Take 5,000-10,000 Units by mouth See admin instructions. Take 5000 units by mouth on Sunday, Tuesday, Thursday, and Saturday. Take 10000 units by mouth on Monday, Wednesday, and Friday    [provider]  EPIPEN 2-PAK 0.3 MG/0.3ML SOAJ injection Inject 0.3 mg into the muscle daily as needed for anaphylaxis. USE ONLY IN EMERGENCY 08/12/12   [provider]  fluticasone (FLONASE) 50 MCG/ACT nasal spray Place 1 spray into the nose daily as needed for rhinitis or allergies.    [provider]  furosemide (LASIX) 20 MG tablet Take 1 tablet (20 mg total) by mouth daily as needed for fluid or edema. 03/07/18   [name], MD  ibuprofen (ADVIL,MOTRIN) 200 MG tablet Take 400 mg by mouth every 8 (eight) hours as needed for headache or  moderate pain.     [provider]  Magnesium 250 MG TABS Take 250 mg by mouth daily.     [provider]  metoprolol succinate (TOPROL-XL) 50 MG 24 hr tablet Take 1 tablet (50 mg total) by mouth 2 (two) times daily. Take with or immediately following a meal. 10/23/18   Deboraha Sprang, MD  Multiple Vitamin (MULTIVITAMIN WITH MINERALS) TABS Take 1 tablet by mouth daily. Centrum Silver    [provider]  Multiple Vitamins-Minerals (PRESERVISION AREDS 2) CAPS Take 1 capsule by mouth 2 (two) times daily.     [provider]  nateglinide (STARLIX) 120 MG tablet Take 120 mg by mouth Twice daily.  07/12/10   [provider]  pioglitazone (ACTOS) 45 MG tablet Take 45 mg by mouth daily.      [provider]  Propylhexedrine Darcella Gasman) INHA Place 1 each into the nose daily as needed (for  congestion).    [provider]  sitaGLIPtin (JANUVIA) 100 MG tablet Take 100 mg by mouth at bedtime.     [provider]  Tiotropium Bromide-Olodaterol (STIOLTO RESPIMAT) 2.5-2.5 MCG/ACT AERS Inhale 2 puffs into the lungs daily. 04/09/18   Lauraine Rinne, NP    Family History Family History  Problem Relation Age of Onset   Heart disease Father    Emphysema Father    Stroke Mother    Cancer Sister        breast cancer   Heart disease Paternal Grandmother    Heart disease Paternal Grandfather    Diabetes Other        grandmother, aunt, uncle   Cancer Other        lung cancer, aunt    Social History Social History   Tobacco Use   Smoking status: Former Smoker    Packs/day: 2.00    Years: 16.00    Pack years: 32.00    Types: Cigarettes    Start date: 02/23/1956    Quit date: 01/31/1971    Years since quitting: 47.7   Smokeless tobacco: Never Used  Substance Use Topics   Alcohol use: Yes    Alcohol/week: 0.0 standard drinks    Comment: 1-2 a week   Drug use: No     Allergies   Latex, Metformin, Rivaroxaban, Other,  Sotalol, and Warfarin sodium   Review of Systems Review of Systems  Constitutional: Negative for chills and fever.  HENT: Negative for congestion and facial swelling.   Eyes: Positive for visual disturbance. Negative for discharge.  Respiratory: Negative for shortness of breath.   Cardiovascular: Negative for chest pain and palpitations.  Gastrointestinal: Negative for abdominal pain, diarrhea and vomiting.  Musculoskeletal: Negative for arthralgias and myalgias.  Skin: Negative for color change and rash.  Neurological: Negative for tremors, syncope and headaches.  Psychiatric/Behavioral: Negative for confusion and dysphoric mood.     Physical Exam Updated Vital Signs BP (!) 143/81 (BP Location: Right Arm)    Pulse 70    Temp 97.9 F (36.6 C) (Oral)    Resp 18    SpO2 100%   Physical Exam Vitals signs and nursing note reviewed.  Constitutional:      Appearance: He is well-developed.  HENT:     Head: Normocephalic and atraumatic.  Eyes:     Pupils: Pupils are equal, round, and reactive to light.  Neck:     Musculoskeletal: Normal range of motion and neck supple.     Vascular: No JVD.  Cardiovascular:     Rate and Rhythm: Normal rate and regular rhythm.     Heart sounds: No murmur. No friction rub. No gallop.   Pulmonary:     Effort: No respiratory distress.     Breath sounds: No wheezing.  Abdominal:     General: There is no distension.     Tenderness: There is no guarding or rebound.  Musculoskeletal: Normal range of motion.  Skin:    Coloration: Skin is not pale.     Findings: No rash.  Neurological:     Mental Status: He is alert and oriented to person, place, and time.     Comments: Right pupil is fixed and dilated otherwise exam is unremarkable  Psychiatric:        Behavior: Behavior normal.      ED Treatments / Results  Labs (all labs ordered are listed, but only abnormal results are displayed) Labs Reviewed  PROTIME-INR - Abnormal;  Notable for the  following components:      Result Value   Prothrombin Time 16.7 (*)    INR 1.4 (*)    All other components within normal limits  CBC - Abnormal; Notable for the following components:   RBC 4.00 (*)    MCV 103.0 (*)    MCH 35.3 (*)    Platelets 124 (*)    All other components within normal limits  DIFFERENTIAL - Abnormal; Notable for the following components:   Lymphs Abs 0.6 (*)    All other components within normal limits  COMPREHENSIVE METABOLIC PANEL - Abnormal; Notable for the following components:   Sodium 130 (*)    Glucose, Bld 133 (*)    Total Bilirubin 2.5 (*)    All other components within normal limits  URINALYSIS, ROUTINE W REFLEX MICROSCOPIC - Abnormal; Notable for the following components:   Glucose, UA 50 (*)    All other components within normal limits  I-STAT CHEM 8, ED - Abnormal; Notable for the following components:   Sodium 133 (*)    Glucose, Bld 128 (*)    All other components within normal limits  SARS CORONAVIRUS 2 (TAT 6-24 HRS)  ETHANOL  APTT  RAPID URINE DRUG SCREEN, HOSP PERFORMED    EKG None  Radiology Ct Head Wo Contrast  Result Date: 11/04/2018 CLINICAL DATA:  Right eye blurred vision. Transient loss of right eye vision 2 days ago. History of atrial fibrillation. EXAM: CT HEAD WITHOUT CONTRAST TECHNIQUE: Contiguous axial images were obtained from the base of the skull through the vertex without intravenous contrast. COMPARISON:  CT head 03/04/2018. FINDINGS: Brain: There is no evidence of acute intracranial hemorrhage, mass lesion, brain edema or extra-axial fluid collection. The ventricles and subarachnoid spaces are appropriately sized for age. Mild chronic small vessel ischemic changes in the periventricular white matter are stable. There is no evidence of acute cortical stroke. Vascular: Intracranial vascular calcifications. No hyperdense vessel identified. Skull: Negative for fracture or focal lesion. Sinuses/Orbits: Minimal ethmoid sinus  mucosal thickening and small bilateral mastoid effusions without coalescence. No orbital abnormalities. Other: None. IMPRESSION: Stable head CT without acute findings or explanation for the patient's symptoms. Stable mild chronic small vessel ischemic changes. Electronically Signed   By: Richardean Sale M.D.   On: 11/04/2018 17:09    Procedures Procedures (including critical care time)  Medications Ordered in ED Medications - No data to display   Initial Impression / Assessment and Plan / ED Course  I have reviewed the triage vital signs and the nursing notes.  Pertinent labs & imaging results that were available during my care of the patient were reviewed by me and considered in my medical decision making (see chart for details).        85 yo M with a chief complaints of complete loss of the right visual fields.  Occurred x2.  Patient saw his ophthalmologist today who told him that he needed to come to the ED for stroke work-up.  Patient CT scan is negative for acute intracranial pathology.  I discussed getting an MRI with the patient and he states that he has a pacemaker defibrillator that he does not think is compatible with MRI.  I discussed case with Dr. Cheral Marker who felt that the patient likely needs to come in for CT angiogram of the head and neck and an echocardiogram and likely an ultrasound of his carotids.  I discussed this with the patient and he is willing to come  to the hospital.  Neurology will come and evaluate the patient at bedside.  Will discuss with the hospitalist.  Final Clinical Impressions(s) / ED Diagnoses   Final diagnoses:  Central retinal artery occlusion of right eye    ED Discharge Orders    None       Tony Etienne, DO 11/04/18 2212

## 2018-11-05 ENCOUNTER — Other Ambulatory Visit: Payer: Self-pay

## 2018-11-05 ENCOUNTER — Telehealth: Payer: Self-pay | Admitting: Cardiovascular Disease

## 2018-11-05 ENCOUNTER — Observation Stay (HOSPITAL_BASED_OUTPATIENT_CLINIC_OR_DEPARTMENT_OTHER): Payer: Medicare Other

## 2018-11-05 ENCOUNTER — Observation Stay (HOSPITAL_COMMUNITY): Payer: Medicare Other

## 2018-11-05 DIAGNOSIS — H5461 Unqualified visual loss, right eye, normal vision left eye: Secondary | ICD-10-CM | POA: Diagnosis not present

## 2018-11-05 DIAGNOSIS — I480 Paroxysmal atrial fibrillation: Secondary | ICD-10-CM | POA: Diagnosis present

## 2018-11-05 DIAGNOSIS — I361 Nonrheumatic tricuspid (valve) insufficiency: Secondary | ICD-10-CM

## 2018-11-05 DIAGNOSIS — I2510 Atherosclerotic heart disease of native coronary artery without angina pectoris: Secondary | ICD-10-CM | POA: Diagnosis present

## 2018-11-05 DIAGNOSIS — Z951 Presence of aortocoronary bypass graft: Secondary | ICD-10-CM | POA: Diagnosis not present

## 2018-11-05 DIAGNOSIS — I482 Chronic atrial fibrillation, unspecified: Secondary | ICD-10-CM | POA: Diagnosis not present

## 2018-11-05 DIAGNOSIS — I255 Ischemic cardiomyopathy: Secondary | ICD-10-CM | POA: Diagnosis present

## 2018-11-05 DIAGNOSIS — I5042 Chronic combined systolic (congestive) and diastolic (congestive) heart failure: Secondary | ICD-10-CM | POA: Diagnosis present

## 2018-11-05 DIAGNOSIS — G459 Transient cerebral ischemic attack, unspecified: Secondary | ICD-10-CM

## 2018-11-05 DIAGNOSIS — I4510 Unspecified right bundle-branch block: Secondary | ICD-10-CM | POA: Diagnosis present

## 2018-11-05 DIAGNOSIS — Z95810 Presence of automatic (implantable) cardiac defibrillator: Secondary | ICD-10-CM | POA: Diagnosis not present

## 2018-11-05 DIAGNOSIS — I700 Atherosclerosis of aorta: Secondary | ICD-10-CM | POA: Diagnosis present

## 2018-11-05 DIAGNOSIS — J449 Chronic obstructive pulmonary disease, unspecified: Secondary | ICD-10-CM | POA: Diagnosis not present

## 2018-11-05 DIAGNOSIS — Z961 Presence of intraocular lens: Secondary | ICD-10-CM | POA: Diagnosis present

## 2018-11-05 DIAGNOSIS — Z952 Presence of prosthetic heart valve: Secondary | ICD-10-CM | POA: Diagnosis not present

## 2018-11-05 DIAGNOSIS — E119 Type 2 diabetes mellitus without complications: Secondary | ICD-10-CM | POA: Diagnosis present

## 2018-11-05 DIAGNOSIS — Z20828 Contact with and (suspected) exposure to other viral communicable diseases: Secondary | ICD-10-CM | POA: Diagnosis present

## 2018-11-05 DIAGNOSIS — I472 Ventricular tachycardia: Secondary | ICD-10-CM | POA: Diagnosis present

## 2018-11-05 DIAGNOSIS — I340 Nonrheumatic mitral (valve) insufficiency: Secondary | ICD-10-CM | POA: Diagnosis not present

## 2018-11-05 DIAGNOSIS — Z01810 Encounter for preprocedural cardiovascular examination: Secondary | ICD-10-CM

## 2018-11-05 DIAGNOSIS — I6521 Occlusion and stenosis of right carotid artery: Principal | ICD-10-CM

## 2018-11-05 DIAGNOSIS — Z9842 Cataract extraction status, left eye: Secondary | ICD-10-CM | POA: Diagnosis not present

## 2018-11-05 DIAGNOSIS — Z9841 Cataract extraction status, right eye: Secondary | ICD-10-CM | POA: Diagnosis not present

## 2018-11-05 DIAGNOSIS — E871 Hypo-osmolality and hyponatremia: Secondary | ICD-10-CM | POA: Diagnosis present

## 2018-11-05 DIAGNOSIS — G453 Amaurosis fugax: Secondary | ICD-10-CM | POA: Diagnosis present

## 2018-11-05 DIAGNOSIS — I509 Heart failure, unspecified: Secondary | ICD-10-CM | POA: Diagnosis not present

## 2018-11-05 DIAGNOSIS — I052 Rheumatic mitral stenosis with insufficiency: Secondary | ICD-10-CM | POA: Diagnosis present

## 2018-11-05 DIAGNOSIS — E785 Hyperlipidemia, unspecified: Secondary | ICD-10-CM | POA: Diagnosis present

## 2018-11-05 DIAGNOSIS — J302 Other seasonal allergic rhinitis: Secondary | ICD-10-CM | POA: Diagnosis present

## 2018-11-05 DIAGNOSIS — I4819 Other persistent atrial fibrillation: Secondary | ICD-10-CM | POA: Diagnosis present

## 2018-11-05 DIAGNOSIS — K0889 Other specified disorders of teeth and supporting structures: Secondary | ICD-10-CM | POA: Diagnosis present

## 2018-11-05 DIAGNOSIS — I110 Hypertensive heart disease with heart failure: Secondary | ICD-10-CM | POA: Diagnosis present

## 2018-11-05 DIAGNOSIS — E118 Type 2 diabetes mellitus with unspecified complications: Secondary | ICD-10-CM | POA: Diagnosis not present

## 2018-11-05 LAB — CREATININE, URINE, RANDOM: Creatinine, Urine: 42.5 mg/dL

## 2018-11-05 LAB — URINALYSIS, DIPSTICK ONLY
Bilirubin Urine: NEGATIVE
Glucose, UA: NEGATIVE mg/dL
Hgb urine dipstick: NEGATIVE
Ketones, ur: NEGATIVE mg/dL
Leukocytes,Ua: NEGATIVE
Nitrite: NEGATIVE
Protein, ur: NEGATIVE mg/dL
Specific Gravity, Urine: 1.018 (ref 1.005–1.030)
pH: 6 (ref 5.0–8.0)

## 2018-11-05 LAB — LIPID PANEL
Cholesterol: 110 mg/dL (ref 0–200)
HDL: 48 mg/dL (ref >40)
LDL Cholesterol: 44 mg/dL (ref 0–99)
Total CHOL/HDL Ratio: 2.3 RATIO
Triglycerides: 90 mg/dL (ref <150)
VLDL: 18 mg/dL (ref 0–40)

## 2018-11-05 LAB — GLUCOSE, CAPILLARY
Glucose-Capillary: 109 mg/dL — ABNORMAL HIGH (ref 70–99)
Glucose-Capillary: 209 mg/dL — ABNORMAL HIGH (ref 70–99)
Glucose-Capillary: 101 mg/dL — ABNORMAL HIGH (ref 70–99)
Glucose-Capillary: 122 mg/dL — ABNORMAL HIGH (ref 70–99)

## 2018-11-05 LAB — TROPONIN I (HIGH SENSITIVITY)
Troponin I (High Sensitivity): 13 ng/L (ref <18)
Troponin I (High Sensitivity): 14 ng/L (ref <18)

## 2018-11-05 LAB — URINALYSIS, ROUTINE W REFLEX MICROSCOPIC
Bilirubin Urine: NEGATIVE
Glucose, UA: NEGATIVE mg/dL
Hgb urine dipstick: NEGATIVE
Ketones, ur: NEGATIVE mg/dL
Leukocytes,Ua: NEGATIVE
Nitrite: NEGATIVE
Protein, ur: NEGATIVE mg/dL
Specific Gravity, Urine: 1.018 (ref 1.005–1.030)
pH: 6 (ref 5.0–8.0)

## 2018-11-05 LAB — ECHOCARDIOGRAM COMPLETE
Height: 74 in
Weight: 3296 oz

## 2018-11-05 LAB — SARS CORONAVIRUS 2 (TAT 6-24 HRS): SARS Coronavirus 2: NEGATIVE

## 2018-11-05 LAB — SODIUM, URINE, RANDOM: Sodium, Ur: 68 mmol/L

## 2018-11-05 LAB — CBG MONITORING, ED: Glucose-Capillary: 153 mg/dL — ABNORMAL HIGH (ref 70–99)

## 2018-11-05 LAB — OSMOLALITY, URINE: Osmolality, Ur: 336 mOsm/kg (ref 300–900)

## 2018-11-05 MED ORDER — UMECLIDINIUM BROMIDE 62.5 MCG/INH IN AEPB
1.0000 | INHALATION_SPRAY | Freq: Every day | RESPIRATORY_TRACT | Status: DC
  Administered 2018-11-06: 1 via RESPIRATORY_TRACT
  Filled 2018-11-05: qty 7

## 2018-11-05 MED ORDER — AMIODARONE HCL 200 MG PO TABS: 200.0000 mg | ORAL_TABLET | ORAL | Status: DC

## 2018-11-05 MED ORDER — ASPIRIN EC 81 MG PO TBEC
81.0000 mg | DELAYED_RELEASE_TABLET | Freq: Every day | ORAL | Status: DC
  Administered 2018-11-05 – 2018-11-08 (×4): 81 mg via ORAL
  Filled 2018-11-05 (×4): qty 1

## 2018-11-05 MED ORDER — METOPROLOL SUCCINATE ER 50 MG PO TB24
50.0000 mg | ORAL_TABLET | Freq: Every day | ORAL | Status: DC
  Administered 2018-11-05 – 2018-11-08 (×3): 50 mg via ORAL
  Filled 2018-11-05 (×3): qty 1

## 2018-11-05 MED ORDER — APIXABAN 5 MG PO TABS
5.0000 mg | ORAL_TABLET | Freq: Two times a day (BID) | ORAL | Status: DC
  Administered 2018-11-05 (×3): 5 mg via ORAL
  Filled 2018-11-05 (×4): qty 1

## 2018-11-05 MED ORDER — ACETAMINOPHEN 325 MG PO TABS
650.0000 mg | ORAL_TABLET | ORAL | Status: DC | PRN
  Administered 2018-11-05 – 2018-11-07 (×3): 650 mg via ORAL
  Filled 2018-11-05 (×2): qty 2

## 2018-11-05 MED ORDER — ACETAMINOPHEN 650 MG RE SUPP: 650.0000 mg | RECTAL | Status: DC | PRN

## 2018-11-05 MED ORDER — WHITE PETROLATUM EX OINT
TOPICAL_OINTMENT | CUTANEOUS | Status: AC
  Administered 2018-11-05: 0.2
  Filled 2018-11-05: qty 28.35

## 2018-11-05 MED ORDER — SODIUM CHLORIDE 0.9 % IV SOLN
INTRAVENOUS | Status: DC
  Administered 2018-11-05 – 2018-11-06 (×3): via INTRAVENOUS

## 2018-11-05 MED ORDER — STROKE: EARLY STAGES OF RECOVERY BOOK
Freq: Once | Status: AC
  Administered 2018-11-05: 10:00:00
  Filled 2018-11-05 (×2): qty 1

## 2018-11-05 MED ORDER — ARFORMOTEROL TARTRATE 15 MCG/2ML IN NEBU
15.0000 ug | INHALATION_SOLUTION | Freq: Two times a day (BID) | RESPIRATORY_TRACT | Status: DC
  Filled 2018-11-05 (×2): qty 2

## 2018-11-05 MED ORDER — ACETAMINOPHEN 160 MG/5ML PO SOLN: 650.0000 mg | ORAL | Status: DC | PRN

## 2018-11-05 MED ORDER — ATORVASTATIN CALCIUM 80 MG PO TABS
80.0000 mg | ORAL_TABLET | Freq: Every day | ORAL | Status: DC
  Administered 2018-11-05 – 2018-11-07 (×4): 80 mg via ORAL
  Filled 2018-11-05 (×4): qty 1

## 2018-11-05 NOTE — Progress Notes (Signed)
STROKE TEAM PROGRESS NOTE   INTERVAL HISTORY Pt sitting in chair, no complains. He had two episodes of right vision loss, lasting 10-15 min each. He has afib on eliquis, no missing doses. CT showed right ICA 90% stenosis. VVS consulted. He is on ASA and eliquis.   Vitals:   11/05/18 0449 11/05/18 0459 11/05/18 0701 11/05/18 0943  BP: (!) 138/95  (!) 118/48 119/69  Pulse: 62  68 63  Resp: 19     Temp: 98.2 F (36.8 C)  (!) 97.5 F (36.4 C)   TempSrc: Oral  Oral   SpO2: 96%     Weight:  93.4 kg    Height:  6\' 2"  (1.88 m)      CBC:  Recent Labs  Lab 11/04/18 1635 11/04/18 1711  WBC 5.6  --   NEUTROABS 4.0  --   HGB 14.1 14.6  HCT 41.2 43.0  MCV 103.0*  --   PLT 124*  --     Basic Metabolic Panel:  Recent Labs  Lab 11/04/18 1635 11/04/18 1711  NA 130* 133*  K 4.6 4.5  CL 99 98  CO2 23  --   GLUCOSE 133* 128*  BUN 12 14  CREATININE 1.00 0.90  CALCIUM 9.2  --    Lipid Panel:     Component Value Date/Time   CHOL 110 11/05/2018 0256   TRIG 90 11/05/2018 0256   HDL 48 11/05/2018 0256   CHOLHDL 2.3 11/05/2018 0256   VLDL 18 11/05/2018 0256   LDLCALC 44 11/05/2018 0256   HgbA1c:  Lab Results  Component Value Date   HGBA1C 6.8 (H) 11/04/2018   Urine Drug Screen:     Component Value Date/Time   LABOPIA NONE DETECTED 11/04/2018 1955   COCAINSCRNUR NONE DETECTED 11/04/2018 1955   LABBENZ NONE DETECTED 11/04/2018 1955   AMPHETMU NONE DETECTED 11/04/2018 1955   THCU NONE DETECTED 11/04/2018 1955   LABBARB NONE DETECTED 11/04/2018 1955    Alcohol Level     Component Value Date/Time   ETH <10 11/04/2018 1905    IMAGING Ct Angio Head W Or Wo Contrast  Result Date: 11/04/2018 CLINICAL DATA:  Intermittent right eye vision loss EXAM: CT ANGIOGRAPHY HEAD AND NECK TECHNIQUE: Multidetector CT imaging of the head and neck was performed using the standard protocol during bolus administration of intravenous contrast. Multiplanar CT image reconstructions and MIPs  were obtained to evaluate the vascular anatomy. Carotid stenosis measurements (when applicable) are obtained utilizing NASCET criteria, using the distal internal carotid diameter as the denominator. CONTRAST:  29mL OMNIPAQUE IOHEXOL 350 MG/ML SOLN COMPARISON:  CTA head neck 12/31/2017 FINDINGS: CTA NECK FINDINGS SKELETON: There is no bony spinal canal stenosis. No lytic or blastic lesion. OTHER NECK: Normal pharynx, larynx and major salivary glands. No cervical lymphadenopathy. Unremarkable thyroid gland. UPPER CHEST: No pneumothorax or pleural effusion. No nodules or masses. AORTIC ARCH: There is mild calcific atherosclerosis of the aortic arch. There is no aneurysm, dissection or hemodynamically significant stenosis of the visualized portion of the aorta. Conventional 3 vessel aortic branching pattern. The visualized proximal subclavian arteries are widely patent. RIGHT CAROTID SYSTEM: No dissection, occlusion or aneurysm. There is mixed density atherosclerosis extending into the proximal ICA, resulting in approximately 90% stenosis. This stenosis has worsened from the prior study. LEFT CAROTID SYSTEM: No dissection, occlusion or aneurysm. Mild atherosclerotic calcification at the carotid bifurcation without hemodynamically significant stenosis. VERTEBRAL ARTERIES: Left dominant configuration. Both origins are clearly patent. There is no dissection, occlusion or  flow-limiting stenosis to the skull base (V1-V3 segments). CTA HEAD FINDINGS POSTERIOR CIRCULATION: --Vertebral arteries: Normal V4 segments. --Posterior inferior cerebellar arteries (PICA): Patent origins from the vertebral arteries. --Anterior inferior cerebellar arteries (AICA): Patent origins from the basilar artery. --Basilar artery: Normal. --Superior cerebellar arteries: Normal. --Posterior cerebral arteries: Normal. Both originate from the basilar artery. Posterior communicating arteries (p-comm) are diminutive or absent. ANTERIOR CIRCULATION:  --Intracranial internal carotid arteries: Atherosclerotic calcification of the internal carotid arteries at the skull base without hemodynamically significant stenosis. --Anterior cerebral arteries (ACA): Normal. Both A1 segments are present. Patent anterior communicating artery (a-comm). --Middle cerebral arteries (MCA): Normal. VENOUS SINUSES: As permitted by contrast timing, patent. ANATOMIC VARIANTS: None Review of the MIP images confirms the above findings. IMPRESSION: 1. No emergent large vessel occlusion or high-grade stenosis of the intracranial arteries. 2. Increased, now approximately 90% stenosis of the proximal right internal carotid artery secondary to mixed density atherosclerosis. 3. Aortic Atherosclerosis (ICD10-I70.0). Electronically Signed   By: Ulyses Jarred M.D.   On: 11/04/2018 23:43   Ct Head Wo Contrast  Result Date: 11/04/2018 CLINICAL DATA:  Right eye blurred vision. Transient loss of right eye vision 2 days ago. History of atrial fibrillation. EXAM: CT HEAD WITHOUT CONTRAST TECHNIQUE: Contiguous axial images were obtained from the base of the skull through the vertex without intravenous contrast. COMPARISON:  CT head 03/04/2018. FINDINGS: Brain: There is no evidence of acute intracranial hemorrhage, mass lesion, brain edema or extra-axial fluid collection. The ventricles and subarachnoid spaces are appropriately sized for age. Mild chronic small vessel ischemic changes in the periventricular white matter are stable. There is no evidence of acute cortical stroke. Vascular: Intracranial vascular calcifications. No hyperdense vessel identified. Skull: Negative for fracture or focal lesion. Sinuses/Orbits: Minimal ethmoid sinus mucosal thickening and small bilateral mastoid effusions without coalescence. No orbital abnormalities. Other: None. IMPRESSION: Stable head CT without acute findings or explanation for the patient's symptoms. Stable mild chronic small vessel ischemic changes.  Electronically Signed   By: Richardean Sale M.D.   On: 11/04/2018 17:09   Ct Angio Neck W And/or Wo Contrast  Result Date: 11/04/2018 CLINICAL DATA:  Intermittent right eye vision loss EXAM: CT ANGIOGRAPHY HEAD AND NECK TECHNIQUE: Multidetector CT imaging of the head and neck was performed using the standard protocol during bolus administration of intravenous contrast. Multiplanar CT image reconstructions and MIPs were obtained to evaluate the vascular anatomy. Carotid stenosis measurements (when applicable) are obtained utilizing NASCET criteria, using the distal internal carotid diameter as the denominator. CONTRAST:  10mL OMNIPAQUE IOHEXOL 350 MG/ML SOLN COMPARISON:  CTA head neck 12/31/2017 FINDINGS: CTA NECK FINDINGS SKELETON: There is no bony spinal canal stenosis. No lytic or blastic lesion. OTHER NECK: Normal pharynx, larynx and major salivary glands. No cervical lymphadenopathy. Unremarkable thyroid gland. UPPER CHEST: No pneumothorax or pleural effusion. No nodules or masses. AORTIC ARCH: There is mild calcific atherosclerosis of the aortic arch. There is no aneurysm, dissection or hemodynamically significant stenosis of the visualized portion of the aorta. Conventional 3 vessel aortic branching pattern. The visualized proximal subclavian arteries are widely patent. RIGHT CAROTID SYSTEM: No dissection, occlusion or aneurysm. There is mixed density atherosclerosis extending into the proximal ICA, resulting in approximately 90% stenosis. This stenosis has worsened from the prior study. LEFT CAROTID SYSTEM: No dissection, occlusion or aneurysm. Mild atherosclerotic calcification at the carotid bifurcation without hemodynamically significant stenosis. VERTEBRAL ARTERIES: Left dominant configuration. Both origins are clearly patent. There is no dissection, occlusion or flow-limiting stenosis to the  skull base (V1-V3 segments). CTA HEAD FINDINGS POSTERIOR CIRCULATION: --Vertebral arteries: Normal V4  segments. --Posterior inferior cerebellar arteries (PICA): Patent origins from the vertebral arteries. --Anterior inferior cerebellar arteries (AICA): Patent origins from the basilar artery. --Basilar artery: Normal. --Superior cerebellar arteries: Normal. --Posterior cerebral arteries: Normal. Both originate from the basilar artery. Posterior communicating arteries (p-comm) are diminutive or absent. ANTERIOR CIRCULATION: --Intracranial internal carotid arteries: Atherosclerotic calcification of the internal carotid arteries at the skull base without hemodynamically significant stenosis. --Anterior cerebral arteries (ACA): Normal. Both A1 segments are present. Patent anterior communicating artery (a-comm). --Middle cerebral arteries (MCA): Normal. VENOUS SINUSES: As permitted by contrast timing, patent. ANATOMIC VARIANTS: None Review of the MIP images confirms the above findings. IMPRESSION: 1. No emergent large vessel occlusion or high-grade stenosis of the intracranial arteries. 2. Increased, now approximately 90% stenosis of the proximal right internal carotid artery secondary to mixed density atherosclerosis. 3. Aortic Atherosclerosis (ICD10-I70.0). Electronically Signed   By: Ulyses Jarred M.D.   On: 11/04/2018 23:43   Dg Chest Port 1 View  Result Date: 11/04/2018 CLINICAL DATA:  TIA EXAM: PORTABLE CHEST 1 VIEW COMPARISON:  March 04, 2018 FINDINGS: There is cardiomegaly. Overlying median sternotomy wires. Left-sided pacemaker seen with the tips in the right atrium and right ventricle. Both lungs are clear. No acute osseous abnormality. IMPRESSION: No acute cardiopulmonary process.  Stable cardiomegaly Electronically Signed   By: Prudencio Pair M.D.   On: 11/04/2018 23:21    PHYSICAL EXAM  Temp:  [97.5 F (36.4 C)-98.2 F (36.8 C)] 97.5 F (36.4 C) (10/06 0701) Pulse Rate:  [56-79] 63 (10/06 0943) Resp:  [15-26] 19 (10/06 0449) BP: (114-143)/(48-95) 119/69 (10/06 0943) SpO2:  [95 %-100 %] 96 %  (10/06 0449) Weight:  [93.4 kg] 93.4 kg (10/06 0459)  General - Well nourished, well developed, in no apparent distress.  Ophthalmologic - fundi not visualized due to noncooperation.  Cardiovascular - Regular rhythm and rate, no fib on exam.  Mental Status -  Level of arousal and orientation to time, place, and person were intact. Language including expression, naming, repetition, comprehension was assessed and found intact. Attention span and concentration were normal. Recent and remote memory were intact. Fund of Knowledge was assessed and was intact.  Cranial Nerves II - XII - II - Visual field intact OU. III, IV, VI - Extraocular movements intact. V - Facial sensation intact bilaterally. VII - Facial movement intact bilaterally. VIII - Hearing & vestibular intact bilaterally. X - Palate elevates symmetrically. XI - Chin turning & shoulder shrug intact bilaterally. XII - Tongue protrusion intact.  Motor Strength - The patient's strength was normal in all extremities and pronator drift was absent.  Bulk was normal and fasciculations were absent.   Motor Tone - Muscle tone was assessed at the neck and appendages and was normal.  Reflexes - The patient's reflexes were symmetrical in all extremities and he had no pathological reflexes.  Sensory - Light touch, temperature/pinprick were assessed and were symmetrical.    Coordination - The patient had normal movements in the hands and feet with no ataxia or dysmetria.  Tremor was absent.  Gait and Station - deferred.   ASSESSMENT/PLAN [name] is a 85 y.o. male with history of AF on Eliquis, CAD s/p CABG, DM, endocarditis, migraines, vtach s/p ICD placement presenting with amaurosis fugax of his right eye.   Amaurosis Fugax OD  CT head No acute abnormality. Stable small vessel disease.   CTA head & neck  no ELVO. prox R ICA 90% stenosis. Aortic atherosclerosis.   Not able to have MRI due to AICD  Carotid  Doppler  pending  2D Echo EF 35-40%. New AF since 03/2018. No source of embolus   LDL 44  HgbA1c 6.8  eliquis for VTE prophylaxis  Eliquis (apixaban) daily prior to admission, now on aspirin 81 mg daily and Eliquis (apixaban) daily. Continue on discharge  Therapy recommendations:  None  Disposition:  pending   Carotid stenosis  CTA neck prox R ICA 90% stenosis.  Carotid doppler pending   VVS consulted to consider CEA in one week  Atrial Fibrillation  Home anticoagulation:  Eliquis (apixaban) daily  . Continue Eliquis (apixaban) daily at discharge  Hypertension  Stable . Permissive hypertension (OK if < 180/105) but gradually normalize in 5-7 days . Goal SBP at least 130-150 given ICA stenosis before CEA . Avoid hypotension  Hyperlipidemia  Home meds:  lipitor 80, resumed in hospital  LDL 44, goal < 70  Continue statin at discharge  Diabetes type II Controlled  HgbA1c 6.8, goal < 7.0  CBGs  SSI  PCP follow up  Other Stroke Risk Factors  Advanced age  Former Cigarette smoker, quit 57 yrs ago  ETOH use, advised to drink no more than 2 drink(s) a day  Family hx stroke (mother)  Coronary artery disease  Migraines 6-7 x mo, worsened on xarelto and warfarin in past  Hx CHF with AICD  Other Active Problems  Hx VTach s/p AICD  Asthma/COPD   Hospital day # 0  Rosalin Hawking, MD PhD Stroke Neurology 11/05/2018 12:39 PM   To contact Stroke Continuity provider, please refer to [email or web address] After hours, contact General Neurology

## 2018-11-05 NOTE — Consult Note (Addendum)
Cardiology Consultation:   Patient ID: DOLPH SOTTO MRN: [id number]; DOB: [birth year]  Admit date: 11/04/2018 Date of Consult: 11/05/2018  Primary Care Provider: [name], MD Primary Cardiologist: Sherren Mocha, MD  Primary Electrophysiologist:  [name], MD    Patient Profile:   [name] is a 85 y.o. male with a hx of CAD s/p CABG and implanted defibrillator for polymorphic VT, mitral valve repair, PFO closure, afib on Eliquis, chronic systolic and diastolic heart failure (EF 64- 45% 2020), ischemic cardiomyopathy, DM, hypertension, hyperlipidemia, carotid artery stenosis who is being seen today for pre-operative evaluation for carotid endarterectomy for Amaurosis Fugax at the request of Dr. Wynelle Cleveland.  History of Present Illness:   Mr. Detoro has a remote history of CABG, PFO closure, and Mitral valve repair at the [facility] clinic in 2001. When he moved in 2010 he established care with Dr. Caryl Comes and had ICD implantation for polymorphic VT. At that time he was having frequent PVCs and symptoms of Raynaud's disease. Echo showed [id number] and mild diastolic dysfunction. Patient also has a longstanding history of afib on tikosyn (previously on sotolol). In 2012 the patient was noted to have more frequent paroxysmal afib and anticoagulation was discussed, but none was started. He had previously been on warfarin but that was discontinued due to migraines. In 2014 patient was having sob and with exertion and underwent a LHC 06/2012 demonstrating 2V CAD, patency of his LIMA to LAD and free RIMA to OM with moderate left Cx stenosis which was nonischemic by pressure wire analysis. Medical management was continued. EF was 35-40%. In 2016 patient underwent device replacement. Anticoagulation for afib was again discussed and Xarelto was tried, but later stopped for severe migraines with aura. He had one intercurrent episode of near syncope associated with NSVT. In 2018 the patient having  recurrent atrial arrhythmia and underwent DCCV via ICD twice with success. In 2018 the patient was seen for exertional dyspnea and underwent cardiac cath 12/2016 which showed severe 2 vessel CAD with continued patency of the LIMA-LAD and right radial graft- OM, moderate, severely calcified proximal Cx stenosis with negative FFR, normal/low LVEDP. He underwent another cardioversion in 2019 for recurrent Afib that was unsuccessful. He was referd then to the Afib clinic for 8 days of persistent a fib. Eliquis was started and patient underwent TEE DCCV. He had failed 2 shocks at home. He converted spontaneously to NSR. Echo in 11/2017 showed EF 35-40%. Patient was seen in the hospital in 03/2018 for syncopal episode. Pacemaker and defibrillator were interrogated and showed heavy burden of afib, but no other arrhythmias. EF at that time was 40-45%. Patient continued to have presyncope and syncopal episodes after discharge. Patient was recently switched from [name] to amiodarone for persistent afib.   The patient presented to the ER 11/04/18 for 2 events of amaurosis fugax of the right eye over the last 3 days. He was having a black shade cover over his eye that lasted for about 20 minutes. He went to his opthalmologist who recommended he be seen in the ER for possible stroke.  He is on Eliquis for afib and has not missed doses. Vitals in the ED BP 143/81, pulse 70, afebrile, RR 18. Labs revealed potassium 4.6, glucose 133, creatinine 1.0, BUN 12. LFTs wnl. Hs troponin 13 > 14. Lipid panel HDL 28, LDL 44, TG 90. WBC 5.6, Hgb 14.1. CTA neck showed a 90% stenosis of right proximal ICA. He denies other stroke-like symptoms.Patient was seen by vascular surgery  and plan for procedure.   Patient is a former smoker (quit 45 years ago). He drinks 1 glass of wine daily. Denies drug use. Patient lives with his wife and is able to perform all ADLs. He has not been able to do as much in the last month being that patient has been  in persistent afib which makes him short of breath onexertion. He denies recent lower leg swelling and orthopnea. Denies chest pain.   Heart Pathway Score:     Past Medical History:  Diagnosis Date   AICD (automatic cardioverter/defibrillator) present    [employer]n PPM/ICD   Coronary artery disease    status  [facility]   Diabetes mellitus    Dysrhythmia    Endocarditis, valve unspecified    Mitral   Headache    hx migraines 6-7 x mo   History of migraine headaches    History of seasonal allergies    ICD (implantable cardiac defibrillator) in place    PAF (paroxysmal atrial fibrillation) ([place])    NO LAA occlusion at the time of surgery  [name] 2018   Pleural effusion    PVC's (premature ventricular contractions)    Ventricular tachycardia, polymorphic ([place])    status post ICD implantation    Past Surgical History:  Procedure Laterality Date   CARDIAC CATHETERIZATION  04/03/2007   EF 40%   CARDIAC CATHETERIZATION  02/06/2006   EF 45%. ANTERIOR HYPOKINESIS   CARDIAC CATHETERIZATION  05/29/2000   EF 50%. MILD ANTERIOR HYPOKINESIS   CARDIAC CATHETERIZATION  10/25/1999   EF 50%. SEVERE MITRAL REGURGITATION   CARDIAC CATHETERIZATION  01/06/2003   EF 50%   CARDIAC DEFIBRILLATOR PLACEMENT     CATARACT EXTRACTION W/ INTRAOCULAR LENS  IMPLANT, BILATERAL     CORONARY ARTERY BYPASS GRAFT  2001   2 VESSEL CABG AND MITRAL VALVE REPAIR. HE HAD A LIMA GRAFT TO THE LAD AND RADIAL ARTERY  GRAFT TO THE OBTUSE MARGINAL  OF LEFT CIRCUMFLEX   EP IMPLANTABLE DEVICE N/A 11/06/2014   Procedure: ICD Generator Changeout;  Surgeon: Deboraha Sprang, MD;  Location: [place] [facility];  Service: Cardiovascular;  Laterality: N/A;   FOREIGN BODY REMOVAL Right 06/21/2017   Procedure: FOREIGN BODY REMOVAL ADULT RIGHT RING FINGER;  Surgeon: Leanora Cover, MD;  Location: [facility];  Service: Orthopedics;  Laterality: Right;   HEMORRHOID SURGERY     HEMORROIDECTOMY      INTRAVASCULAR PRESSURE WIRE/FFR STUDY N/A 01/05/2017   Procedure: INTRAVASCULAR PRESSURE WIRE/FFR STUDY;  Surgeon: Sherren Mocha, MD;  Location: [place] [facility];  Service: Cardiovascular;  Laterality: N/A;   KNEE ARTHROSCOPY     RIGHT KNEE   LEFT HEART CATHETERIZATION WITH CORONARY ANGIOGRAM N/A 07/04/2012   Procedure: LEFT HEART CATHETERIZATION WITH CORONARY ANGIOGRAM;  Surgeon: Sherren Mocha, MD;  Location: [facility] C[facility];  Service: Cardiovascular;  Laterality: N/A;   MITRAL VALVE REPLACEMENT     No LAA occlusion at the time of surgery  Karie Soda report 2018   RIGHT/LEFT HEART CATH AND CORONARY/GRAFT ANGIOGRAPHY N/A 01/05/2017   Procedure: RIGHT/LEFT HEART CATH AND CORONARY/GRAFT ANGIOGRAPHY;  Surgeon: Sherren Mocha, MD;  Location: [place] [facility];  Service: Cardiovascular;  Laterality: N/A;   TRANSTHORACIC ECHOCARDIOGRAM  04/02/2007   EF 35%     Home Medications:  Prior to Admission medications   Medication Sig Start Date End Date Taking? Authorizing Provider  acetaminophen (TYLENOL) 325 MG tablet Take 2 tablets (650 mg total) by mouth every 6 (six) hours  as needed for mild pain (or Fever >/= 101). 03/07/18  Yes [name], MD  amiodarone (PACERONE) 200 MG tablet Take 1 tablet (200 mg total) TWICE daily for one month, then reduce and take 1 tablet (200 mg daily) ONCE daily Patient taking differently: Take 200 mg by mouth See admin instructions. Take 1 tablet (200 mg total) TWICE daily for one month, then reduce and take 1 tablet (200 mg daily) ONCE daily 10/26/18  Yes Deboraha Sprang, MD  apixaban (ELIQUIS) 5 MG TABS tablet Take 1 tablet (5 mg total) by mouth 2 (two) times daily. 10/23/18  Yes Deboraha Sprang, MD  atorvastatin (LIPITOR) 80 MG tablet Take 80 mg by mouth at bedtime.  07/12/10  Yes [provider]  Cholecalciferol (VITAMIN D-3) 5000 UNITS TABS Take 5,000-10,000 Units by mouth See admin instructions. Take 5000 units by mouth on Sunday, Tuesday, Thursday, and  Saturday. Take 10000 units by mouth on Monday, Wednesday, and Friday   Yes [provider]  EPIPEN 2-PAK 0.3 MG/0.3ML SOAJ injection Inject 0.3 mg into the muscle daily as needed for anaphylaxis. USE ONLY IN EMERGENCY 08/12/12  Yes [provider]  fluticasone (FLONASE) 50 MCG/ACT nasal spray Place 1 spray into the nose daily as needed for rhinitis or allergies.   Yes [provider]  furosemide (LASIX) 20 MG tablet Take 1 tablet (20 mg total) by mouth daily as needed for fluid or edema. 03/07/18  Yes [name], MD  ibuprofen (ADVIL,MOTRIN) 200 MG tablet Take 400 mg by mouth every 8 (eight) hours as needed for headache or moderate pain.    Yes [provider]  Magnesium 250 MG TABS Take 250 mg by mouth daily.    Yes [provider]  metoprolol succinate (TOPROL-XL) 50 MG 24 hr tablet Take 1 tablet (50 mg total) by mouth 2 (two) times daily. Take with or immediately following a meal. Patient taking differently: Take 50 mg by mouth daily. Take with or immediately following a meal. 10/23/18  Yes Deboraha Sprang, MD  Multiple Vitamin (MULTIVITAMIN WITH MINERALS) TABS Take 1 tablet by mouth daily. Centrum Silver   Yes [provider]  Multiple Vitamins-Minerals (PRESERVISION AREDS 2) CAPS Take 1 capsule by mouth 2 (two) times daily.    Yes [provider]  nateglinide (STARLIX) 120 MG tablet Take 120 mg by mouth Twice daily.  07/12/10  Yes [provider]  pioglitazone (ACTOS) 45 MG tablet Take 45 mg by mouth daily.     Yes [provider]  sitaGLIPtin (JANUVIA) 100 MG tablet Take 100 mg by mouth at bedtime.    Yes [provider]  Tiotropium Bromide-Olodaterol (STIOLTO RESPIMAT) 2.5-2.5 MCG/ACT AERS Inhale 2 puffs into the lungs daily. 04/09/18  Yes Lauraine Rinne, NP    Inpatient Medications: Scheduled Meds:  amiodarone  200 mg Oral See admin instructions   apixaban  5 mg Oral BID   aspirin EC  81 mg Oral  Daily   atorvastatin  80 mg Oral QHS   insulin aspart  0-5 Units Subcutaneous QHS   insulin aspart  0-9 Units Subcutaneous TID WC   metoprolol succinate  50 mg Oral Daily   Continuous Infusions:  sodium chloride 75 mL/hr at 11/05/18 0943   PRN Meds: acetaminophen **OR** acetaminophen (TYLENOL) oral liquid 160 mg/5 mL **OR** acetaminophen  Allergies:    Allergies  Allergen Reactions   Latex Other (See Comments)    REDNESS AT REACTION SITE.   Metformin Diarrhea  Rivaroxaban Other (See Comments)    Headaches, blurred vision   Other Other (See Comments)    BEE STINGS    Sotalol Other (See Comments)    "BLUE TOES"- cut his circulation off   Warfarin Sodium Other (See Comments)    REACTION: migraine headaches/vision impariment    Social History:   Social History   Socioeconomic History   Marital status: Married    Spouse name: Mardene Celeste   Number of children: 2   Years of education: Not on file   Highest education level: Associate degree: academic program  Occupational History   Occupation: retired  Scientist, product/process development strain: Not on file   Food insecurity    Worry: Not on file    Inability: Not on Lexicographer needs    Medical: Not on file    Non-medical: Not on file  Tobacco Use   Smoking status: Former Smoker    Packs/day: 2.00    Years: 16.00    Pack years: 32.00    Types: Cigarettes    Start date: 02/23/1956    Quit date: 01/31/1971    Years since quitting: 47.7   Smokeless tobacco: Never Used  Substance and Sexual Activity   Alcohol use: Yes    Alcohol/week: 0.0 standard drinks    Comment: 1-2 a week   Drug use: No   Sexual activity: Yes  Lifestyle   Physical activity    Days per week: Not on file    Minutes per session: Not on file   Stress: Not on file  Relationships   Social connections    Talks on phone: Not on file    Gets together: Not on file    Attends religious service: Not on file     Active member of club or organization: Not on file    Attends meetings of clubs or organizations: Not on file    Relationship status: Not on file   Intimate partner violence    Fear of current or ex partner: Not on file    Emotionally abused: Not on file    Physically abused: Not on file    Forced sexual activity: Not on file  Other Topics Concern   Not on file  Social History Narrative   Patient is right-handed. He lives with his wife ina 2 story home. He drinks one cup of coffee and a diet coke a day.     Family History:   Family History  Problem Relation Age of Onset   Heart disease Father    Emphysema Father    Stroke Mother    Cancer Sister        breast cancer   Heart disease Paternal Grandmother    Heart disease Paternal Grandfather    Diabetes Other        grandmother, aunt, uncle   Cancer Other        lung cancer, aunt     ROS:  Please see the history of present illness.  All other ROS reviewed and negative.     Physical Exam/Data:   Vitals:   11/05/18 0449 11/05/18 0459 11/05/18 0701 11/05/18 0943  BP: (!) 138/95  (!) 118/48 119/69  Pulse: 62  68 63  Resp: 19     Temp: 98.2 F (36.8 C)  (!) 97.5 F (36.4 C)   TempSrc: Oral  Oral   SpO2: 96%     Weight:  93.4 kg    Height:  6\' 2"  (  1.88 m)      Intake/Output Summary (Last 24 hours) at 11/05/2018 1305 Last data filed at 11/05/2018 0700 Gross per 24 hour  Intake 162.6 ml  Output 600 ml  Net -437.4 ml   Last 3 Weights 11/05/2018 10/23/2018 08/20/2018  Weight (lbs) 206 lb 216 lb 3.2 oz 213 lb  Weight (kg) 93.441 kg 98.068 kg 96.616 kg     Body mass index is 26.45 kg/m.  General:  Well nourished, well developed, in no acute distress HEENT: normal Lymph: no adenopathy Neck: no JVD Endocrine:  No thryomegaly Vascular: No carotid bruits; FA pulses 2+ bilaterally without bruits  Cardiac:  normal S1, S2; RRR; diastolic murmur  Lungs:  clear to auscultation bilaterally, no wheezing, rhonchi or  rales  Abd: soft, nontender, no hepatomegaly  Ext: no edema Musculoskeletal:  No deformities, BUE and BLE strength normal and equal Skin: warm and dry  Neuro:  CNs 2-12 intact, no focal abnormalities noted Psych:  Normal affect   EKG:  The EKG was personally reviewed and demonstrates:  Afib, 62 bpm, RBBB, QRS 204 ms, nonspecific T wave changes lateral leads Telemetry:  Telemetry was personally reviewed and demonstrates:  Mostly V-paced rhythm Hr 60-70s. Some A-paced rhythms as well; occasional afib noted; 2 beats of NSVT  Relevant CV Studies:  Echo 11/05/18 1. Left ventricular ejection fraction, by visual estimation, is 35 to 40%. The left ventricle has moderately decreased function. There is global left ventricular hypokinesis and marked apical-basal and septal-lateral dyssynchrony. There appears to be  apical akinesis.  2. Abnormal septal motion consistent with RV pacemaker.  3. Global right ventricle has moderately reduced systolic function.The right ventricular size is normal. No increase in right ventricular wall thickness.  4. Left atrial size was severely dilated.  5. Right atrial size was moderately dilated.  6. Mild mitral valve regurgitation. Mild-moderate mitral stenosis. Peak and mean gradients are 16 and 5 mm Hg, respectively, at 70 bpm. Estimated valve area is 1.4 cm sq by the PHT method.  7. Moderate calcification of the mitral valve leaflet(s).  8. Severe mitral annular calcification.  9. Severe thickening of the mitral valve leaflet(s). 10. The tricuspid valve is normal in structure. Tricuspid valve regurgitation is mild-moderate. 11. The aortic valve is normal in structure. Aortic valve regurgitation was not visualized by color flow Doppler. 12. The pulmonic valve was normal in structure. Pulmonic valve regurgitation is mild by color flow Doppler. 13. Mildly elevated pulmonary artery systolic pressure. 14. A pacer wire is visualized. 15. The inferior vena cava is dilated  in size with >50% respiratory variability, suggesting right atrial pressure of 8 mmHg. 16. Left ventricular diastolic function could not be evaluated due to atrial fibrillation and mitral stenosis. 52. Compared to February 2020, mitral valve gradients are worse, probably due to new atrial fibrillation and increased ventricular rate.  Echo 03/04/18   1. The left ventricle has mild-moderately reduced systolic function of [id number]. The cavity size is normal. There is mild concentric left ventricular wall thickness. Echo evidence of pseudonormal diastolic filling patterns. Elevated mean left atrial  pressure. There is abnormal septal motion consistent with RV pacing or LBBB. The apex is severely hypokinetic.  2. The right ventricle is normal in size. There is normal systolic function. Right ventricular systolic pressure is mildly elevated with an estimated pressure of 41.5 mmHg.  3. Severely dilated left atrial size.  4. Mildly dilated right atrial size.  5. Mild-to-moderate mitral valve stenosis.  6. Mild-moderate tricuspid regurgitation.  7. The ascending aorta and aortic root are normal is size and structure.  8. The inferior vena cava was dilated in size with >50% respiratory variablity.  9. No atrial level shunt detected by color flow Doppler.  Cardiac Cath 2018 1. Severe 2 vessel CAD with continued patency of the LIMA-LAD and right radial graft-OM 2. Moderate, severely calcified, proximal circumflex stenosis with negative FFR assessment 3. Normal right heart hemodynamics except for a prominent V wave in the PCWP tracing 4. Normal/low LVEDP  Laboratory Data:  High Sensitivity Troponin:   Recent Labs  Lab 11/04/18 2330 11/05/18 0046  TROPONINIHS 13 14     Chemistry Recent Labs  Lab 11/04/18 1635 11/04/18 1711  NA 130* 133*  K 4.6 4.5  CL 99 98  CO2 23  --   GLUCOSE 133* 128*  BUN 12 14  CREATININE 1.00 0.90  CALCIUM 9.2  --   GFRNONAA >60  --   GFRAA >60  --   ANIONGAP 8  --      Recent Labs  Lab 11/04/18 1635  PROT 7.5  ALBUMIN 4.3  AST 27  ALT 23  ALKPHOS 89  BILITOT 2.5*   Hematology Recent Labs  Lab 11/04/18 1635 11/04/18 1711  WBC 5.6  --   RBC 4.00*  --   HGB 14.1 14.6  HCT 41.2 43.0  MCV 103.0*  --   MCH 35.3*  --   MCHC 34.2  --   RDW 12.9  --   PLT 124*  --    BNPNo results for input(s): BNP, PROBNP in the last 168 hours.  DDimer No results for input(s): DDIMER in the last 168 hours.   Radiology/Studies:  Ct Angio Head W Or Wo Contrast  Result Date: 11/04/2018 CLINICAL DATA:  Intermittent right eye vision loss EXAM: CT ANGIOGRAPHY HEAD AND NECK TECHNIQUE: Multidetector CT imaging of the head and neck was performed using the standard protocol during bolus administration of intravenous contrast. Multiplanar CT image reconstructions and MIPs were obtained to evaluate the vascular anatomy. Carotid stenosis measurements (when applicable) are obtained utilizing NASCET criteria, using the distal internal carotid diameter as the denominator. CONTRAST:  34mL OMNIPAQUE IOHEXOL 350 MG/ML SOLN COMPARISON:  CTA head neck 12/31/2017 FINDINGS: CTA NECK FINDINGS SKELETON: There is no bony spinal canal stenosis. No lytic or blastic lesion. OTHER NECK: Normal pharynx, larynx and major salivary glands. No cervical lymphadenopathy. Unremarkable thyroid gland. UPPER CHEST: No pneumothorax or pleural effusion. No nodules or masses. AORTIC ARCH: There is mild calcific atherosclerosis of the aortic arch. There is no aneurysm, dissection or hemodynamically significant stenosis of the visualized portion of the aorta. Conventional 3 vessel aortic branching pattern. The visualized proximal subclavian arteries are widely patent. RIGHT CAROTID SYSTEM: No dissection, occlusion or aneurysm. There is mixed density atherosclerosis extending into the proximal ICA, resulting in approximately 90% stenosis. This stenosis has worsened from the prior study. LEFT CAROTID SYSTEM: No  dissection, occlusion or aneurysm. Mild atherosclerotic calcification at the carotid bifurcation without hemodynamically significant stenosis. VERTEBRAL ARTERIES: Left dominant configuration. Both origins are clearly patent. There is no dissection, occlusion or flow-limiting stenosis to the skull base (V1-V3 segments). CTA HEAD FINDINGS POSTERIOR CIRCULATION: --Vertebral arteries: Normal V4 segments. --Posterior inferior cerebellar arteries (PICA): Patent origins from the vertebral arteries. --Anterior inferior cerebellar arteries (AICA): Patent origins from the basilar artery. --Basilar artery: Normal. --Superior cerebellar arteries: Normal. --Posterior cerebral arteries: Normal. Both originate from the basilar artery. Posterior communicating arteries (p-comm) are diminutive or absent.  ANTERIOR CIRCULATION: --Intracranial internal carotid arteries: Atherosclerotic calcification of the internal carotid arteries at the skull base without hemodynamically significant stenosis. --Anterior cerebral arteries (ACA): Normal. Both A1 segments are present. Patent anterior communicating artery (a-comm). --Middle cerebral arteries (MCA): Normal. VENOUS SINUSES: As permitted by contrast timing, patent. ANATOMIC VARIANTS: None Review of the MIP images confirms the above findings. IMPRESSION: 1. No emergent large vessel occlusion or high-grade stenosis of the intracranial arteries. 2. Increased, now approximately 90% stenosis of the proximal right internal carotid artery secondary to mixed density atherosclerosis. 3. Aortic Atherosclerosis (ICD10-I70.0). Electronically Signed   By: Ulyses Jarred M.D.   On: 11/04/2018 23:43   Ct Head Wo Contrast  Result Date: 11/04/2018 CLINICAL DATA:  Right eye blurred vision. Transient loss of right eye vision 2 days ago. History of atrial fibrillation. EXAM: CT HEAD WITHOUT CONTRAST TECHNIQUE: Contiguous axial images were obtained from the base of the skull through the vertex without  intravenous contrast. COMPARISON:  CT head 03/04/2018. FINDINGS: Brain: There is no evidence of acute intracranial hemorrhage, mass lesion, brain edema or extra-axial fluid collection. The ventricles and subarachnoid spaces are appropriately sized for age. Mild chronic small vessel ischemic changes in the periventricular white matter are stable. There is no evidence of acute cortical stroke. Vascular: Intracranial vascular calcifications. No hyperdense vessel identified. Skull: Negative for fracture or focal lesion. Sinuses/Orbits: Minimal ethmoid sinus mucosal thickening and small bilateral mastoid effusions without coalescence. No orbital abnormalities. Other: None. IMPRESSION: Stable head CT without acute findings or explanation for the patient's symptoms. Stable mild chronic small vessel ischemic changes. Electronically Signed   By: Richardean Sale M.D.   On: 11/04/2018 17:09   Ct Angio Neck W And/or Wo Contrast  Result Date: 11/04/2018 CLINICAL DATA:  Intermittent right eye vision loss EXAM: CT ANGIOGRAPHY HEAD AND NECK TECHNIQUE: Multidetector CT imaging of the head and neck was performed using the standard protocol during bolus administration of intravenous contrast. Multiplanar CT image reconstructions and MIPs were obtained to evaluate the vascular anatomy. Carotid stenosis measurements (when applicable) are obtained utilizing NASCET criteria, using the distal internal carotid diameter as the denominator. CONTRAST:  13mL OMNIPAQUE IOHEXOL 350 MG/ML SOLN COMPARISON:  CTA head neck 12/31/2017 FINDINGS: CTA NECK FINDINGS SKELETON: There is no bony spinal canal stenosis. No lytic or blastic lesion. OTHER NECK: Normal pharynx, larynx and major salivary glands. No cervical lymphadenopathy. Unremarkable thyroid gland. UPPER CHEST: No pneumothorax or pleural effusion. No nodules or masses. AORTIC ARCH: There is mild calcific atherosclerosis of the aortic arch. There is no aneurysm, dissection or hemodynamically  significant stenosis of the visualized portion of the aorta. Conventional 3 vessel aortic branching pattern. The visualized proximal subclavian arteries are widely patent. RIGHT CAROTID SYSTEM: No dissection, occlusion or aneurysm. There is mixed density atherosclerosis extending into the proximal ICA, resulting in approximately 90% stenosis. This stenosis has worsened from the prior study. LEFT CAROTID SYSTEM: No dissection, occlusion or aneurysm. Mild atherosclerotic calcification at the carotid bifurcation without hemodynamically significant stenosis. VERTEBRAL ARTERIES: Left dominant configuration. Both origins are clearly patent. There is no dissection, occlusion or flow-limiting stenosis to the skull base (V1-V3 segments). CTA HEAD FINDINGS POSTERIOR CIRCULATION: --Vertebral arteries: Normal V4 segments. --Posterior inferior cerebellar arteries (PICA): Patent origins from the vertebral arteries. --Anterior inferior cerebellar arteries (AICA): Patent origins from the basilar artery. --Basilar artery: Normal. --Superior cerebellar arteries: Normal. --Posterior cerebral arteries: Normal. Both originate from the basilar artery. Posterior communicating arteries (p-comm) are diminutive or absent. ANTERIOR CIRCULATION: --Intracranial internal  carotid arteries: Atherosclerotic calcification of the internal carotid arteries at the skull base without hemodynamically significant stenosis. --Anterior cerebral arteries (ACA): Normal. Both A1 segments are present. Patent anterior communicating artery (a-comm). --Middle cerebral arteries (MCA): Normal. VENOUS SINUSES: As permitted by contrast timing, patent. ANATOMIC VARIANTS: None Review of the MIP images confirms the above findings. IMPRESSION: 1. No emergent large vessel occlusion or high-grade stenosis of the intracranial arteries. 2. Increased, now approximately 90% stenosis of the proximal right internal carotid artery secondary to mixed density atherosclerosis. 3.  Aortic Atherosclerosis (ICD10-I70.0). Electronically Signed   By: Ulyses Jarred M.D.   On: 11/04/2018 23:43   Dg Chest Port 1 View  Result Date: 11/04/2018 CLINICAL DATA:  TIA EXAM: PORTABLE CHEST 1 VIEW COMPARISON:  March 04, 2018 FINDINGS: There is cardiomegaly. Overlying median sternotomy wires. Left-sided pacemaker seen with the tips in the right atrium and right ventricle. Both lungs are clear. No acute osseous abnormality. IMPRESSION: No acute cardiopulmonary process.  Stable cardiomegaly Electronically Signed   By: Prudencio Pair M.D.   On: 11/04/2018 23:21    Assessment and Plan:   Pre-op evaluation for Right Carotid enterectomy for mini stroke Patient has been having vision issues for many months now and has had multiple work-ups. He has many syncope events as well. ICD and defibrillator checks did not show any reason for this. More recently he had 2 events of Amaurosis Fugax found to have severe Right ICA stenosis of 90% with plan for endarterectomy versus TCAR.  - Patient has a complicated cardiac history with CABG, mitral valve replacement, ICD implantation, chronic afib, chronic combined heart failure, and ischemic cardiomyopathy.  - Patient has no recent chest pain. Last cath was 2018 showing 2V disease and patent of the LIMA-LAD and right radial graft-OM with calcified proximal Cx with negative FFR assessment. No further ischemic work up at this time - Patient is euvolemic on exam. He is short of breath on exertion but but this is morel likely due to Afib.  - EF has been down to 30% in the past. Earlier this year it was 40-45%. Echo this admission is 35-40%, global hypokinesis and marked apical-basal an septal-lateral dyssynchrony, apical akinesis, La and RA severely dilated, mild MR, mild MS. Mitral valve gradients are worse, probably due to afib and increased ventricular rate.  - More recently he has had increased afib burden and is switching from tikosyn to amiodarone. The afib makes  the patient more short of breath and subsequently his functional status has decreased. He is able to perform light work around the house, climb a flight of stares, and walk 1 block.  - According to duke activity Status Index METS 4.64 - According to Revised Cardiac Risk Index patient has 30 day risk of 15% for death, MI, or cardiac arrest.  - I suspect the patient will be able to undergo surgery. Would transition Eliquis to heparin in anticipation of surgery.   Afib on eliquis - Patient follows with the afib clinic and Dr. Caryl Comes. He has been in persistent afib for the last month and has been switched from [name] to amiodarone.  - Patient is symptomatic in afib, more short of breath on exertion. - EKG on admission shows afib with old RBBB, rate controlled - Telemetry is mostly V-paced. Occasional afib noted  - Continued BB and Eliquis. Eliquis will be held for surgery>> would transition to heparin  CAD s/p CABG 2001 Last cath 2018 showed 2V disease and patent of the LIMA-LAD and  right radial graft-OM with calcified proximal Cx with negative FFR assessment. - Patient is chest pain free - HS troponin 13 > 14 - Not on Aspirin at baseline. Eliquis for a/c  - continue statin and BB  Ischemic cardiomyopathy EF in 03/2018 40-45% but has been as low as 30% in the past. Echo this admission is 35-40%, global hypokinesis and marked apical-basal an septal-lateral dyssynchrony, apical akinesis, LA and RA severely dilated, mild MR, mild MS. Mitral valve gradients are worse, probably due to afib and increased ventricular rate.  - continue BB   Chronic systolic and diastolic CHF - Echo as above - No signs of decompensation at this time. Shortness of breath likely due to persistent afib.  - Continue BB   VT s/p Implantable St. Jude defibrillator Implanted in 2010 by Dr. Caryl Comes. Device replaced in 2016 - Last device check was 10/23/18 showing normal functioning device, 7 VT-NS episodes, 55.5% AT/AF  burden   For questions or updates, please contact [facility] Please consult [email or web address] for contact info under     Signed, Cadence Ninfa Meeker, PA-C  11/05/2018 1:05 PM   As above, patient seen and examined.  Briefly he is an 85 year old male with past medical history of coronary artery disease status post coronary artery bypass and graft, mitral valve repair, PFO closure, persistent atrial fibrillation, history of polymorphic VT status post ICD, ischemic cardiomyopathy, diabetes mellitus, hypertension, hyperlipidemia and recent TIA for preoperative evaluation prior to carotid endarterectomy.  Last cardiac catheterization December 2018 showed severe two-vessel coronary disease with patency of the LIMA to the LAD and right radial graft to the obtuse marginal.  Medical therapy recommended.  Echocardiogram today shows ejection fraction 35 to 40%, biatrial enlargement, mild to moderate mitral stenosis with mean gradient 5 mmHg, mild to moderate tricuspid regurgitation. Patient had a recent TIA with transient visual loss in the right eye.  Work-up reveals a 90% right proximal internal carotid artery stenosis.  Cardiology asked to evaluate prior to carotid endarterectomy.  Note patient does have paroxysmal atrial fibrillation.  When he is not in atrial fibrillation he has dyspnea with more vigorous activities but can walk with no chest pain.  He can climb 2 flights of stairs with no dyspnea or chest pain.  He has more dyspnea on exertion at present due to persistent atrial fibrillation.  Recently started on amiodarone.  Electrocardiogram shows ventricular pacing with underlying atrial fibrillation.  1 preoperative evaluation prior to carotid endarterectomy-patient had his last cardiac catheterization in December 2018 which revealed patent grafts and medical therapy recommended.  He has reasonable functional capacity particularly when he is not in atrial fibrillation with no chest pain.  His risk will be  moderately elevated because of history of coronary disease, ischemic cardiomyopathy, prior ICD and atrial fibrillation.  However he may proceed with with surgery without further cardiac testing.  2 persistent atrial fibrillation-patient remains in atrial fibrillation.  If decision has been made that he will clearly require carotid endarterectomy then would hold apixaban 48 hours prior to procedure.  Given recent TIA would cover with heparin while off of apixaban though event likely was related to carotid disease.  Can resume apixaban postoperatively when okay with vascular surgery.  Continue amiodarone.  Plan will ultimately be to proceed with cardioversion 4 weeks after he has been reinitiated on apixaban.  3 ischemic cardiomyopathy-continue beta-blocker.  I am unclear as to why patient is not receiving an ARB but this can be readdressed as an outpatient.  4  prior ICD  5 diabetes mellitus-Per primary care.  Follow CBGs.  Kirk Ruths, MD   Kirk Ruths, MD

## 2018-11-05 NOTE — Telephone Encounter (Signed)
Pt and his wife wanted Dr. Caryl Comes to know he was admitted last night after a long ER stay yesterday and is being told after a CT that he will be needing an Endarterectomy.. he is having a carotid US today and very anxious... they thought Dr. Burt Knack could do the surgery but advised he does not do those particular procedures and they will call a Vascular Surgeon to see him.   I advised her I will send her message to Dr. Caryl Comes for his FYI she says it would make the pt feel better knowing he is aware of what is going in with him.

## 2018-11-05 NOTE — Progress Notes (Signed)
  Echocardiogram 2D Echocardiogram has been performed.  [name] 11/05/2018, 9:19 AM

## 2018-11-05 NOTE — Progress Notes (Signed)
PROGRESS NOTE    [name]   [id number]  DOB: 04-09-1931  DOA: 11/04/2018 PCP: [name], MD   Brief Narrative:  [name] is a 85 y.o. male with medical history significant of history of CAD status post AICD placed for history of polymorphic V. Tach, chronic systolic CHF,  DM 2, endocarditis of mitral valve, paroxysmal atrial fibrillation who presentswith temporary loss of vision in the right eye which happened 2 wks ago as well. He is admitted for a TIA work up.    Subjective: He has no compliatins.   Assessment & Plan:   Principal Problem:   Amaurosis fugax of right eye - CTA head/neck > 90% stenosis of right carotid artery- d/w patient and neurology today - I have asked for a vascular surgery eval - cont Lipiror  Active Problems:   A fib- V tach with AICD -CHA2DS2-VASc Score 8 - con Eliquis, Amiodarone & metoprolol     Asthma-COPD overlap syndrome  - cont Respimat- no wheezing or dyspnea.     Type 2 diabetes mellitus with complication, without long-term current use of insulin  . Holding Starlix, Actos and Januvia - cont SSI insulin     Time spent in minutes: 35 min DVT prophylaxis:  Eliquis Code Status: Full code Family Communication: none Disposition Plan: awaiting vascular surgery  Consultants:   Vascular surgery  Neurology Procedures:     Antimicrobials:  Anti-infectives (From admission, onward)   None       Objective: Vitals:   11/05/18 0449 11/05/18 0459 11/05/18 0701 11/05/18 0943  BP: (!) 138/95  (!) 118/48 119/69  Pulse: 62  68 63  Resp: 19     Temp: 98.2 F (36.8 C)  (!) 97.5 F (36.4 C)   TempSrc: Oral  Oral   SpO2: 96%     Weight:  93.4 kg    Height:  6\' 2"  (1.88 m)      Intake/Output Summary (Last 24 hours) at 11/05/2018 1413 Last data filed at 11/05/2018 0700 Gross per 24 hour  Intake 162.6 ml  Output 600 ml  Net -437.4 ml   Filed Weights   11/05/18 0459  Weight: 93.4 kg     Examination: General exam: Appears comfortable  HEENT: PERRLA, oral mucosa moist, no sclera icterus or thrush Respiratory system: Clear to auscultation. Respiratory effort normal. Cardiovascular system: S1 & S2 heard, RRR.   Gastrointestinal system: Abdomen soft, non-tender, nondistended. Normal bowel sounds. Central nervous system: Alert and oriented. No focal neurological deficits. Extremities: No cyanosis, clubbing or edema Skin: No rashes or ulcers Psychiatry:  Mood & affect appropriate.     Data Reviewed: I have personally reviewed following labs and imaging studies  CBC: Recent Labs  Lab 11/04/18 1635 11/04/18 1711  WBC 5.6  --   NEUTROABS 4.0  --   HGB 14.1 14.6  HCT 41.2 43.0  MCV 103.0*  --   PLT 124*  --    Basic Metabolic Panel: Recent Labs  Lab 11/04/18 1635 11/04/18 1711  NA 130* 133*  K 4.6 4.5  CL 99 98  CO2 23  --   GLUCOSE 133* 128*  BUN 12 14  CREATININE 1.00 0.90  CALCIUM 9.2  --    GFR: Estimated Creatinine Clearance: 69.8 mL/min (by C-G formula based on SCr of 0.9 mg/dL). Liver Function Tests: Recent Labs  Lab 11/04/18 1635  AST 27  ALT 23  ALKPHOS 89  BILITOT 2.5*  PROT 7.5  ALBUMIN  4.3   No results for input(s): LIPASE, AMYLASE in the last 168 hours. No results for input(s): AMMONIA in the last 168 hours. Coagulation Profile: Recent Labs  Lab 11/04/18 1635  INR 1.4*   Cardiac Enzymes: No results for input(s): CKTOTAL, CKMB, CKMBINDEX, TROPONINI in the last 168 hours. BNP (last 3 results) Recent Labs    12/18/17 1506  PROBNP 1,465*   HbA1C: Recent Labs    11/04/18 2330  HGBA1C 6.8*   CBG: Recent Labs  Lab 11/05/18 0123 11/05/18 0534 11/05/18 1240  GLUCAP 153* 109* 209*   Lipid Profile: Recent Labs    11/05/18 0256  CHOL 110  HDL 48  LDLCALC 44  TRIG 90  CHOLHDL 2.3   Thyroid Function Tests: No results for input(s): TSH, T4TOTAL, FREET4, T3FREE, THYROIDAB in the last 72 hours. Anemia Panel: No  results for input(s): VITAMINB12, FOLATE, FERRITIN, TIBC, IRON, RETICCTPCT in the last 72 hours. Urine analysis:    Component Value Date/Time   COLORURINE YELLOW 11/05/2018 0019   APPEARANCEUR CLEAR 11/05/2018 0019   LABSPEC 1.018 11/05/2018 0019   PHURINE 6.0 11/05/2018 0019   GLUCOSEU NEGATIVE 11/05/2018 0019   HGBUR NEGATIVE 11/05/2018 0019   BILIRUBINUR NEGATIVE 11/05/2018 0019   KETONESUR NEGATIVE 11/05/2018 0019   PROTEINUR NEGATIVE 11/05/2018 0019   NITRITE NEGATIVE 11/05/2018 0019   LEUKOCYTESUR NEGATIVE 11/05/2018 0019   Sepsis Labs: @LABRCNTIP (procalcitonin:4,lacticidven:4) ) Recent Results (from the past 240 hour(s))  SARS CORONAVIRUS 2 (TAT 6-24 HRS) Nasopharyngeal Nasopharyngeal Swab     Status: None   Collection Time: 11/04/18 10:12 PM   Specimen: Nasopharyngeal Swab  Result Value Ref Range Status   SARS Coronavirus 2 NEGATIVE NEGATIVE Final    Comment: (NOTE) SARS-CoV-2 target nucleic acids are NOT DETECTED. The SARS-CoV-2 RNA is generally detectable in upper and lower respiratory specimens during the acute phase of infection. Negative results do not preclude SARS-CoV-2 infection, do not rule out co-infections with other pathogens, and should not be used as the sole basis for treatment or other patient management decisions. Negative results must be combined with clinical observations, patient history, and epidemiological information. The expected result is Negative. Fact Sheet for Patients: SugarRoll.be Fact Sheet for Healthcare Providers: https://www.woods-mathews.com/ This test is not yet approved or cleared by the [place] FDA and  has been authorized for detection and/or diagnosis of SARS-CoV-2 by FDA under an Emergency Use Authorization (EUA). This EUA will remain  in effect (meaning this test can be used) for the duration of the COVID-19 declaration under Section 56 4(b)(1) of the Act, 21 U.S.C. section  360bbb-3(b)(1), unless the authorization is terminated or revoked sooner. Performed at [facility], [place] [address], [place], [place] [address]          Radiology Studies: Ct Angio Head W Or Wo Contrast  Result Date: 11/04/2018 CLINICAL DATA:  Intermittent right eye vision loss EXAM: CT ANGIOGRAPHY HEAD AND NECK TECHNIQUE: Multidetector CT imaging of the head and neck was performed using the standard protocol during bolus administration of intravenous contrast. Multiplanar CT image reconstructions and MIPs were obtained to evaluate the vascular anatomy. Carotid stenosis measurements (when applicable) are obtained utilizing NASCET criteria, using the distal internal carotid diameter as the denominator. CONTRAST:  33mL OMNIPAQUE IOHEXOL 350 MG/ML SOLN COMPARISON:  CTA head neck 12/31/2017 FINDINGS: CTA NECK FINDINGS SKELETON: There is no bony spinal canal stenosis. No lytic or blastic lesion. OTHER NECK: Normal pharynx, larynx and major salivary glands. No cervical lymphadenopathy. Unremarkable thyroid gland. UPPER  CHEST: No pneumothorax or pleural effusion. No nodules or masses. AORTIC ARCH: There is mild calcific atherosclerosis of the aortic arch. There is no aneurysm, dissection or hemodynamically significant stenosis of the visualized portion of the aorta. Conventional 3 vessel aortic branching pattern. The visualized proximal subclavian arteries are widely patent. RIGHT CAROTID SYSTEM: No dissection, occlusion or aneurysm. There is mixed density atherosclerosis extending into the proximal ICA, resulting in approximately 90% stenosis. This stenosis has worsened from the prior study. LEFT CAROTID SYSTEM: No dissection, occlusion or aneurysm. Mild atherosclerotic calcification at the carotid bifurcation without hemodynamically significant stenosis. VERTEBRAL ARTERIES: Left dominant configuration. Both origins are clearly patent. There is no dissection, occlusion or flow-limiting stenosis to the  skull base (V1-V3 segments). CTA HEAD FINDINGS POSTERIOR CIRCULATION: --Vertebral arteries: Normal V4 segments. --Posterior inferior cerebellar arteries (PICA): Patent origins from the vertebral arteries. --Anterior inferior cerebellar arteries (AICA): Patent origins from the basilar artery. --Basilar artery: Normal. --Superior cerebellar arteries: Normal. --Posterior cerebral arteries: Normal. Both originate from the basilar artery. Posterior communicating arteries (p-comm) are diminutive or absent. ANTERIOR CIRCULATION: --Intracranial internal carotid arteries: Atherosclerotic calcification of the internal carotid arteries at the skull base without hemodynamically significant stenosis. --Anterior cerebral arteries (ACA): Normal. Both A1 segments are present. Patent anterior communicating artery (a-comm). --Middle cerebral arteries (MCA): Normal. VENOUS SINUSES: As permitted by contrast timing, patent. ANATOMIC VARIANTS: None Review of the MIP images confirms the above findings. IMPRESSION: 1. No emergent large vessel occlusion or high-grade stenosis of the intracranial arteries. 2. Increased, now approximately 90% stenosis of the proximal right internal carotid artery secondary to mixed density atherosclerosis. 3. Aortic Atherosclerosis (ICD10-I70.0). Electronically Signed   By: Ulyses Jarred M.D.   On: 11/04/2018 23:43   Ct Head Wo Contrast  Result Date: 11/04/2018 CLINICAL DATA:  Right eye blurred vision. Transient loss of right eye vision 2 days ago. History of atrial fibrillation. EXAM: CT HEAD WITHOUT CONTRAST TECHNIQUE: Contiguous axial images were obtained from the base of the skull through the vertex without intravenous contrast. COMPARISON:  CT head 03/04/2018. FINDINGS: Brain: There is no evidence of acute intracranial hemorrhage, mass lesion, brain edema or extra-axial fluid collection. The ventricles and subarachnoid spaces are appropriately sized for age. Mild chronic small vessel ischemic changes  in the periventricular white matter are stable. There is no evidence of acute cortical stroke. Vascular: Intracranial vascular calcifications. No hyperdense vessel identified. Skull: Negative for fracture or focal lesion. Sinuses/Orbits: Minimal ethmoid sinus mucosal thickening and small bilateral mastoid effusions without coalescence. No orbital abnormalities. Other: None. IMPRESSION: Stable head CT without acute findings or explanation for the patient's symptoms. Stable mild chronic small vessel ischemic changes. Electronically Signed   By: Richardean Sale M.D.   On: 11/04/2018 17:09   Ct Angio Neck W And/or Wo Contrast  Result Date: 11/04/2018 CLINICAL DATA:  Intermittent right eye vision loss EXAM: CT ANGIOGRAPHY HEAD AND NECK TECHNIQUE: Multidetector CT imaging of the head and neck was performed using the standard protocol during bolus administration of intravenous contrast. Multiplanar CT image reconstructions and MIPs were obtained to evaluate the vascular anatomy. Carotid stenosis measurements (when applicable) are obtained utilizing NASCET criteria, using the distal internal carotid diameter as the denominator. CONTRAST:  9mL OMNIPAQUE IOHEXOL 350 MG/ML SOLN COMPARISON:  CTA head neck 12/31/2017 FINDINGS: CTA NECK FINDINGS SKELETON: There is no bony spinal canal stenosis. No lytic or blastic lesion. OTHER NECK: Normal pharynx, larynx and major salivary glands. No cervical lymphadenopathy. Unremarkable thyroid gland. UPPER CHEST: No pneumothorax or  pleural effusion. No nodules or masses. AORTIC ARCH: There is mild calcific atherosclerosis of the aortic arch. There is no aneurysm, dissection or hemodynamically significant stenosis of the visualized portion of the aorta. Conventional 3 vessel aortic branching pattern. The visualized proximal subclavian arteries are widely patent. RIGHT CAROTID SYSTEM: No dissection, occlusion or aneurysm. There is mixed density atherosclerosis extending into the proximal  ICA, resulting in approximately 90% stenosis. This stenosis has worsened from the prior study. LEFT CAROTID SYSTEM: No dissection, occlusion or aneurysm. Mild atherosclerotic calcification at the carotid bifurcation without hemodynamically significant stenosis. VERTEBRAL ARTERIES: Left dominant configuration. Both origins are clearly patent. There is no dissection, occlusion or flow-limiting stenosis to the skull base (V1-V3 segments). CTA HEAD FINDINGS POSTERIOR CIRCULATION: --Vertebral arteries: Normal V4 segments. --Posterior inferior cerebellar arteries (PICA): Patent origins from the vertebral arteries. --Anterior inferior cerebellar arteries (AICA): Patent origins from the basilar artery. --Basilar artery: Normal. --Superior cerebellar arteries: Normal. --Posterior cerebral arteries: Normal. Both originate from the basilar artery. Posterior communicating arteries (p-comm) are diminutive or absent. ANTERIOR CIRCULATION: --Intracranial internal carotid arteries: Atherosclerotic calcification of the internal carotid arteries at the skull base without hemodynamically significant stenosis. --Anterior cerebral arteries (ACA): Normal. Both A1 segments are present. Patent anterior communicating artery (a-comm). --Middle cerebral arteries (MCA): Normal. VENOUS SINUSES: As permitted by contrast timing, patent. ANATOMIC VARIANTS: None Review of the MIP images confirms the above findings. IMPRESSION: 1. No emergent large vessel occlusion or high-grade stenosis of the intracranial arteries. 2. Increased, now approximately 90% stenosis of the proximal right internal carotid artery secondary to mixed density atherosclerosis. 3. Aortic Atherosclerosis (ICD10-I70.0). Electronically Signed   By: Ulyses Jarred M.D.   On: 11/04/2018 23:43   Dg Chest Port 1 View  Result Date: 11/04/2018 CLINICAL DATA:  TIA EXAM: PORTABLE CHEST 1 VIEW COMPARISON:  March 04, 2018 FINDINGS: There is cardiomegaly. Overlying median sternotomy  wires. Left-sided pacemaker seen with the tips in the right atrium and right ventricle. Both lungs are clear. No acute osseous abnormality. IMPRESSION: No acute cardiopulmonary process.  Stable cardiomegaly Electronically Signed   By: Prudencio Pair M.D.   On: 11/04/2018 23:21      Scheduled Meds:  amiodarone  200 mg Oral See admin instructions   apixaban  5 mg Oral BID   aspirin EC  81 mg Oral Daily   atorvastatin  80 mg Oral QHS   insulin aspart  0-5 Units Subcutaneous QHS   insulin aspart  0-9 Units Subcutaneous TID WC   metoprolol succinate  50 mg Oral Daily   Continuous Infusions:  sodium chloride 75 mL/hr at 11/05/18 0943     LOS: 0 days      Debbe Odea, MD [facility] Pager: [email or web address] Password [email or web address] 11/05/2018, 2:13 PM

## 2018-11-05 NOTE — Evaluation (Signed)
Physical Therapy Evaluation and Discharge Patient Details Name: [name] MRN: [id number] DOB: Nov 06, 1931 Today's Date: 11/05/2018   History of Present Illness  Admitted with amaurosis fugax, or transient blindness, Amaurosis fugax (transient monocular blindness) is a symptom of transient retinal ischemia. Like fleeting hemiparesis or unilateral sensory loss it may progress to permanent blindness or stroke; stroke workup underway  Clinical Impression   Patient evaluated by Physical Therapy with no further acute PT needs identified. All education has been completed and the patient has no further questions.  See below for any follow-up Physical Therapy or equipment needs. PT is signing off. Thank you for this referral.        Follow Up Recommendations No PT follow up    Equipment Recommendations  None recommended by PT    Recommendations for Other Services       Precautions / Restrictions Precautions Precautions: None      Mobility  Bed Mobility Overal bed mobility: Independent             General bed mobility comments: pt OOB  Transfers Overall transfer level: Modified independent                  Ambulation/Gait Ambulation/Gait assistance: Supervision;Modified independent (Device/Increase time) Gait Distance (Feet): 200 Feet Assistive device: None;IV Pole Gait Pattern/deviations: Step-through pattern     General Gait Details: Slow, but relatively steady with no gross loss of balance  Stairs Stairs: Yes Stairs assistance: Supervision Stair Management: One rail Right;One rail Left;Forwards;Step to pattern Number of Stairs: 4 General stair comments: No difficulty  Wheelchair Mobility    Modified Rankin (Stroke Patients Only) Modified Rankin (Stroke Patients Only) Pre-Morbid Rankin Score: No symptoms Modified Rankin: No symptoms     Balance Overall balance assessment: Mild deficits observed, not formally tested                                           Pertinent Vitals/Pain Pain Assessment: No/denies pain    Home Living Family/patient expects to be discharged to:: Private residence Living Arrangements: Spouse/significant other Available Help at Discharge: Family;Available 24 hours/day Type of Home: House       Home Layout: Two level Home Equipment: Shower seat;Bedside commode;Walker - 2 wheels;Cane - single point      Prior Function Level of Independence: Independent               Hand Dominance        Extremity/Trunk Assessment   Upper Extremity Assessment Upper Extremity Assessment: Defer to OT evaluation    Lower Extremity Assessment Lower Extremity Assessment: Overall WFL for tasks assessed       Communication   Communication: No difficulties  Cognition Arousal/Alertness: Awake/alert Behavior During Therapy: WFL for tasks assessed/performed Overall Cognitive Status: Within Functional Limits for tasks assessed                                        General Comments General comments (skin integrity, edema, etc.): We discussed signs and symptoms of CVA, as well as risk factor modification    Exercises     Assessment/Plan    PT Assessment Patent does not need any further PT services  PT Problem List         PT Treatment Interventions  PT Goals (Current goals can be found in the Care Plan section)  Acute Rehab PT Goals Patient Stated Goal: have surgery and get home PT Goal Formulation: All assessment and education complete, DC therapy    Frequency     Barriers to discharge        Co-evaluation               AM-PAC PT "6 Clicks" Mobility  Outcome Measure Help needed turning from your back to your side while in a flat bed without using bedrails?: None Help needed moving from lying on your back to sitting on the side of a flat bed without using bedrails?: None Help needed moving to and from a bed to a chair (including a  wheelchair)?: None Help needed standing up from a chair using your arms (e.g., wheelchair or bedside chair)?: None Help needed to walk in hospital room?: None Help needed climbing 3-5 steps with a railing? : None 6 Click Score: 24    End of Session Equipment Utilized During Treatment: Gait belt Activity Tolerance: Patient tolerated treatment well Patient left: in chair;with call bell/phone within reach Nurse Communication: Mobility status PT Visit Diagnosis: Other symptoms and signs involving the nervous system (R29.898)    Time: [id number] PT Time Calculation (min) (ACUTE ONLY): 26 min   Charges:   PT Evaluation $PT Eval Low Complexity: 1 Low PT Treatments $Gait Training: 8-22 mins        Roney Marion, PT  Acute Rehabilitation Services Pager [phone] Office [phone]   [place]ta [place] 11/05/2018, 2:20 PM

## 2018-11-05 NOTE — Progress Notes (Signed)
SCD's requested from portable equipment, none available at this time.

## 2018-11-05 NOTE — Progress Notes (Signed)
Carotid Duplex complete, see CV Proc tab for preliminary results. Lita Mains- RDMS, RVT 5:21 PM  11/05/2018

## 2018-11-05 NOTE — Consult Note (Addendum)
Hospital Consult    Reason for Consult:  Amaurosis fugax, carotid artery stenosis Requesting Physician:  Dr. Cheral Marker MRN #:  [id number]  History of Present Illness: This is a 85 y.o. male with past medical history significant for CAD with history of CABG and implanted defibrillator, atrial fibrillation on Eliquis,  diabetes mellitus, and carotid artery stenosis.  He presented to the emergency department after his second episode in 3 days of amaurosis fugax of right eye.  He describes a black shade covering his eye which last for about 10 minutes.  Work-up has included CTA neck which demonstrates a 90% stenosis of right proximal ICA.  He denies any other strokelike symptoms including slurring speech or one-sided weakness.  Patient states that he has been taking Eliquis regularly and has not missed a dose.  He denies any history of claudication or tissue loss bilateral lower extremities.  He is a former smoker.  Patient's primary cardiologist is Dr. Burt Knack and electrophysiologist is Dr. Jens Som.  Past Medical History:  Diagnosis Date  . AICD (automatic cardioverter/defibrillator) present    [employer]n PPM/ICD  . Coronary artery disease    [facility]  . Diabetes mellitus   . Dysrhythmia   . Endocarditis, valve unspecified    Mitral  . Headache    hx migraines 6-7 x mo  . History of migraine headaches   . History of seasonal allergies   . ICD (implantable cardiac defibrillator) in place   . PAF (paroxysmal atrial fibrillation) ([place])    NO LAA occlusion at the time of surgery  [name] 2018  . Pleural effusion   . PVC's (premature ventricular contractions)   . Ventricular tachycardia, polymorphic ([place])    status post ICD implantation    Past Surgical History:  Procedure Laterality Date  . CARDIAC CATHETERIZATION  04/03/2007   EF 40%  . CARDIAC CATHETERIZATION  02/06/2006   EF 45%. ANTERIOR HYPOKINESIS  . CARDIAC CATHETERIZATION  05/29/2000   EF 50%. MILD ANTERIOR  HYPOKINESIS  . CARDIAC CATHETERIZATION  10/25/1999   EF 50%. SEVERE MITRAL REGURGITATION  . CARDIAC CATHETERIZATION  01/06/2003   EF 50%  . CARDIAC DEFIBRILLATOR PLACEMENT    . CATARACT EXTRACTION W/ INTRAOCULAR LENS  IMPLANT, BILATERAL    . CORONARY ARTERY BYPASS GRAFT  2001   2 VESSEL CABG AND MITRAL VALVE REPAIR. HE HAD A LIMA GRAFT TO THE LAD AND RADIAL ARTERY  GRAFT TO THE OBTUSE MARGINAL  OF LEFT CIRCUMFLEX  . EP IMPLANTABLE DEVICE N/A 11/06/2014   Procedure: ICD Generator Changeout;  Surgeon: Deboraha Sprang, MD;  Location: [facility];  Service: Cardiovascular;  Laterality: N/A;  . FOREIGN BODY REMOVAL Right 06/21/2017   Procedure: FOREIGN BODY REMOVAL ADULT RIGHT RING FINGER;  Surgeon: Leanora Cover, MD;  Location: [place];  Service: Orthopedics;  Laterality: Right;  . HEMORRHOID SURGERY    . HEMORROIDECTOMY    . INTRAVASCULAR PRESSURE WIRE/FFR STUDY N/A 01/05/2017   Procedure: INTRAVASCULAR PRESSURE WIRE/FFR STUDY;  Surgeon: Sherren Mocha, MD;  Location: [facility];  Service: Cardiovascular;  Laterality: N/A;  . KNEE ARTHROSCOPY     RIGHT KNEE  . LEFT HEART CATHETERIZATION WITH CORONARY ANGIOGRAM N/A 07/04/2012   Procedure: LEFT HEART CATHETERIZATION WITH CORONARY ANGIOGRAM;  Surgeon: Sherren Mocha, MD;  Location: [facility];  Service: Cardiovascular;  Laterality: N/A;  . MITRAL VALVE REPLACEMENT     No LAA occlusion at the time of surgery  Karie Soda report 2018  .  RIGHT/LEFT HEART CATH AND CORONARY/GRAFT ANGIOGRAPHY N/A 01/05/2017   Procedure: RIGHT/LEFT HEART CATH AND CORONARY/GRAFT ANGIOGRAPHY;  Surgeon: Sherren Mocha, MD;  Location: [facility];  Service: Cardiovascular;  Laterality: N/A;  . TRANSTHORACIC ECHOCARDIOGRAM  04/02/2007   EF 35%    Allergies  Allergen Reactions  . Latex Other (See Comments)    REDNESS AT REACTION SITE.  Marland Kitchen Metformin Diarrhea  . Rivaroxaban Other (See Comments)    Headaches, blurred vision  . Other Other (See Comments)    BEE  STINGS   . Sotalol Other (See Comments)    "BLUE TOES"- cut his circulation off  . Warfarin Sodium Other (See Comments)    REACTION: migraine headaches/vision impariment    Prior to Admission medications   Medication Sig Start Date End Date Taking? Authorizing Provider  acetaminophen (TYLENOL) 325 MG tablet Take 2 tablets (650 mg total) by mouth every 6 (six) hours as needed for mild pain (or Fever >/= 101). 03/07/18  Yes [name], MD  amiodarone (PACERONE) 200 MG tablet Take 1 tablet (200 mg total) TWICE daily for one month, then reduce and take 1 tablet (200 mg daily) ONCE daily Patient taking differently: Take 200 mg by mouth See admin instructions. Take 1 tablet (200 mg total) TWICE daily for one month, then reduce and take 1 tablet (200 mg daily) ONCE daily 10/26/18  Yes Deboraha Sprang, MD  apixaban (ELIQUIS) 5 MG TABS tablet Take 1 tablet (5 mg total) by mouth 2 (two) times daily. 10/23/18  Yes Deboraha Sprang, MD  atorvastatin (LIPITOR) 80 MG tablet Take 80 mg by mouth at bedtime.  07/12/10  Yes [provider]  Cholecalciferol (VITAMIN D-3) 5000 UNITS TABS Take 5,000-10,000 Units by mouth See admin instructions. Take 5000 units by mouth on Sunday, Tuesday, Thursday, and Saturday. Take 10000 units by mouth on Monday, Wednesday, and Friday   Yes [provider]  EPIPEN 2-PAK 0.3 MG/0.3ML SOAJ injection Inject 0.3 mg into the muscle daily as needed for anaphylaxis. USE ONLY IN EMERGENCY 08/12/12  Yes [provider]  fluticasone (FLONASE) 50 MCG/ACT nasal spray Place 1 spray into the nose daily as needed for rhinitis or allergies.   Yes [provider]  furosemide (LASIX) 20 MG tablet Take 1 tablet (20 mg total) by mouth daily as needed for fluid or edema. 03/07/18  Yes [name], MD  ibuprofen (ADVIL,MOTRIN) 200 MG tablet Take 400 mg by mouth every 8 (eight) hours as needed for headache or moderate pain.    Yes [provider]  Magnesium  250 MG TABS Take 250 mg by mouth daily.    Yes [provider]  metoprolol succinate (TOPROL-XL) 50 MG 24 hr tablet Take 1 tablet (50 mg total) by mouth 2 (two) times daily. Take with or immediately following a meal. Patient taking differently: Take 50 mg by mouth daily. Take with or immediately following a meal. 10/23/18  Yes Deboraha Sprang, MD  Multiple Vitamin (MULTIVITAMIN WITH MINERALS) TABS Take 1 tablet by mouth daily. Centrum Silver   Yes [provider]  Multiple Vitamins-Minerals (PRESERVISION AREDS 2) CAPS Take 1 capsule by mouth 2 (two) times daily.    Yes [provider]  nateglinide (STARLIX) 120 MG tablet Take 120 mg by mouth Twice daily.  07/12/10  Yes [provider]  pioglitazone (ACTOS) 45 MG tablet Take 45 mg by mouth daily.     Yes [provider]  sitaGLIPtin (JANUVIA) 100 MG tablet Take  100 mg by mouth at bedtime.    Yes [provider]  Tiotropium Bromide-Olodaterol (STIOLTO RESPIMAT) 2.5-2.5 MCG/ACT AERS Inhale 2 puffs into the lungs daily. 04/09/18  Yes Lauraine Rinne, NP    Social History   Socioeconomic History  . Marital status: Married    Spouse name: Mardene Celeste  . Number of children: 2  . Years of education: Not on file  . Highest education level: Associate degree: academic program  Occupational History  . Occupation: retired  Scientific laboratory technician  . Financial resource strain: Not on file  . Food insecurity    Worry: Not on file    Inability: Not on file  . Transportation needs    Medical: Not on file    Non-medical: Not on file  Tobacco Use  . Smoking status: Former Smoker    Packs/day: 2.00    Years: 16.00    Pack years: 32.00    Types: Cigarettes    Start date: 02/23/1956    Quit date: 01/31/1971    Years since quitting: 47.7  . Smokeless tobacco: Never Used  Substance and Sexual Activity  . Alcohol use: Yes    Alcohol/week: 0.0 standard drinks    Comment: 1-2 a week  . Drug use: No  . Sexual activity:  Yes  Lifestyle  . Physical activity    Days per week: Not on file    Minutes per session: Not on file  . Stress: Not on file  Relationships  . Social Herbalist on phone: Not on file    Gets together: Not on file    Attends religious service: Not on file    Active member of club or organization: Not on file    Attends meetings of clubs or organizations: Not on file    Relationship status: Not on file  . Intimate partner violence    Fear of current or ex partner: Not on file    Emotionally abused: Not on file    Physically abused: Not on file    Forced sexual activity: Not on file  Other Topics Concern  . Not on file  Social History Narrative   Patient is right-handed. He lives with his wife ina 2 story home. He drinks one cup of coffee and a diet coke a day.      Family History  Problem Relation Age of Onset  . Heart disease Father   . Emphysema Father   . Stroke Mother   . Cancer Sister        breast cancer  . Heart disease Paternal Grandmother   . Heart disease Paternal Grandfather   . Diabetes Other        grandmother, aunt, uncle  . Cancer Other        lung cancer, aunt    ROS: Otherwise negative unless mentioned in HPI  Physical Examination  Vitals:   11/05/18 0701 11/05/18 0943  BP: (!) 118/48 119/69  Pulse: 68 63  Resp:    Temp: (!) 97.5 F (36.4 C)   SpO2:     Body mass index is 26.45 kg/m.  General:  WDWN in NAD Gait: Not observed HENT: WNL, normocephalic Pulmonary: normal non-labored breathing Cardiac: regular Abdomen:  soft, NT/ND, no masses Skin: without rashes Vascular Exam/Pulses: symmetrical radial pulses Extremities: without ischemic changes, without Gangrene , without cellulitis; without open wounds  Musculoskeletal: no muscle wasting or atrophy  Neurologic: A&O X 3; CN grossly intact Psychiatric:  The pt has Normal  affect. Lymph:  Unremarkable  CBC    Component Value Date/Time   WBC 5.6 11/04/2018 1635   RBC 4.00  (L) 11/04/2018 1635   HGB 14.6 11/04/2018 1711   HGB 13.4 10/23/2018 1318   HCT 43.0 11/04/2018 1711   HCT 39.3 10/23/2018 1318   PLT 124 (L) 11/04/2018 1635   PLT 130 (L) 10/23/2018 1318   MCV 103.0 (H) 11/04/2018 1635   MCV 100 (H) 10/23/2018 1318   MCH 35.3 (H) 11/04/2018 1635   MCHC 34.2 11/04/2018 1635   RDW 12.9 11/04/2018 1635   RDW 12.4 10/23/2018 1318   LYMPHSABS 0.6 (L) 11/04/2018 1635   LYMPHSABS 0.9 12/27/2016 1523   MONOABS 0.7 11/04/2018 1635   EOSABS 0.3 11/04/2018 1635   EOSABS 0.2 12/27/2016 1523   BASOSABS 0.0 11/04/2018 1635   BASOSABS 0.0 12/27/2016 1523    BMET    Component Value Date/Time   NA 133 (L) 11/04/2018 1711   NA 135 10/23/2018 1318   K 4.5 11/04/2018 1711   CL 98 11/04/2018 1711   CO2 23 11/04/2018 1635   GLUCOSE 128 (H) 11/04/2018 1711   BUN 14 11/04/2018 1711   BUN 16 10/23/2018 1318   CREATININE 0.90 11/04/2018 1711   CREATININE 1.08 01/04/2016 1630   CALCIUM 9.2 11/04/2018 1635   GFRNONAA >60 11/04/2018 1635   GFRAA >60 11/04/2018 1635    COAGS: Lab Results  Component Value Date   INR 1.4 (H) 11/04/2018   INR 1.1 12/27/2016   INR 1.0 07/03/2012     Non-Invasive Vascular Imaging:   CTA shows 90% stenosis of proximal R ICA  Statin:  Yes.   Beta Blocker:  Yes.   Aspirin:  No. ACEI:  No. ARB:  No. CCB use:  No Other antiplatelets/anticoagulants:  Yes.   Eliquis   ASSESSMENT/PLAN: This is a 85 y.o. male with amaurosis fugax x2 episodes, R ICA stenosis  2 episodes of amaurosis fugax over the past 3 days and right eye CTA demonstrating 90% stenosis of right proximal ICA; duplex pending Patient will require revascularization of right ICA with endarterectomy versus TCAR Eliquis will need to be held 48 hours prior to surgery Dr. Trula Slade will evaluate the patient and provide further treatment plans including timing and nature of surgery   Dagoberto Ligas PA-C [facility] [phone]   I agree with  the above.  I have seen and evaluated the patient.  The patient has had 2 episodes of amaurosis fugax within the last 3 days.  He had a CT scan that showed a 90% right carotid bifurcation lesion.  I suspect this is the etiology of his amaurosis.  In order to decrease his risk for stroke, I have recommended carotid intervention.  I discussed extensively with the patient and his daughter that we could proceed with endarterectomy or stenting.  Before making this decision, I would like to have formal cardiology evaluation given his extensive cardiac history.  My preference would be to proceed with carotid endarterectomy.  He will need to be off of his Eliquis for 48 hours.  I would recommend that we transition him to heparin.  He will also need to be on aspirin 81 mg.  He is already on statin therapy.  I anticipate having his operation done Thursday or Friday.  I will follow-up tomorrow with final recommendations.  I did discuss with the patient that because of scheduling issues 1 of my partners will most likely perform his procedure.  Annamarie Major

## 2018-11-05 NOTE — Consult Note (Signed)
NEURO HOSPITALIST CONSULT NOTE   Requestig physician: Dr. Wynelle Cleveland  Reason for Consult: Amaurosis fugax, right eye.   History obtained from:   Patient and Chart     HPI:                                                                                                                                          [name] is an 85 y.o. male presenting to [facility] from his [place] office for stroke evaluation after experiencing amaurosis fugax of his right eye at home. The episode of vision loss occurred suddenly while sitting at his computer on Sunday - everything went dark in his right eye, involving all of the visual fields. His wife appeared as a dark silhouette when looking out of his right eye, but normal with his left. The symptoms started to resolve after about 30 minutes, with returning vision like a "curtain being drawn back" over a period of a few minutes. He went to see his Ophthalmologist, who performed a dilated retinal exam OD, stated to the patient that he felt the transient vision loss was most likely due to a CRAO, and sent him to the ED for stroke evaluation. His vision is now completely back to baseline. He also endorsed a prior episode of right sided vision loss on Saturday.   The patient is on Eliquis for atrial fibrillation. He states that he has increased frequency of migraines when taking Coumadin or Xarelto. He is allergic to Pradaxa. He has an AICD which he states is not MRI compatible.   Past Medical History:  Diagnosis Date  . AICD (automatic cardioverter/defibrillator) present    [employer]n PPM/ICD  . Coronary artery disease    [facility]  [facility]  . Diabetes mellitus   . Dysrhythmia   . Endocarditis, valve unspecified    Mitral  . Headache    hx migraines 6-7 x mo  . History of migraine headaches   . History of seasonal allergies   . ICD (implantable cardiac defibrillator) in place   . PAF (paroxysmal atrial  fibrillation) ([place])    NO LAA occlusion at the time of surgery  [name] 2018  . Pleural effusion   . PVC's (premature ventricular contractions)   . Ventricular tachycardia, polymorphic ([place])    status post ICD implantation    Past Surgical History:  Procedure Laterality Date  . CARDIAC CATHETERIZATION  04/03/2007   EF 40%  . CARDIAC CATHETERIZATION  02/06/2006   EF 45%. ANTERIOR HYPOKINESIS  . CARDIAC CATHETERIZATION  05/29/2000   EF 50%. MILD ANTERIOR HYPOKINESIS  . CARDIAC CATHETERIZATION  10/25/1999   EF 50%. SEVERE MITRAL REGURGITATION  . CARDIAC CATHETERIZATION  01/06/2003   EF 50%  . CARDIAC DEFIBRILLATOR PLACEMENT    .  CATARACT EXTRACTION W/ INTRAOCULAR LENS  IMPLANT, BILATERAL    . CORONARY ARTERY BYPASS GRAFT  2001   2 VESSEL CABG AND MITRAL VALVE REPAIR. HE HAD A LIMA GRAFT TO THE LAD AND RADIAL ARTERY  GRAFT TO THE OBTUSE MARGINAL  OF LEFT CIRCUMFLEX  . EP IMPLANTABLE DEVICE N/A 11/06/2014   Procedure: ICD Generator Changeout;  Surgeon: Deboraha Sprang, MD;  Location: [place] [facility];  Service: Cardiovascular;  Laterality: N/A;  . FOREIGN BODY REMOVAL Right 06/21/2017   Procedure: FOREIGN BODY REMOVAL ADULT RIGHT RING FINGER;  Surgeon: Leanora Cover, MD;  Location: [place];  Service: Orthopedics;  Laterality: Right;  . HEMORRHOID SURGERY    . HEMORROIDECTOMY    . INTRAVASCULAR PRESSURE WIRE/FFR STUDY N/A 01/05/2017   Procedure: INTRAVASCULAR PRESSURE WIRE/FFR STUDY;  Surgeon: Sherren Mocha, MD;  Location: [place] [facility];  Service: Cardiovascular;  Laterality: N/A;  . KNEE ARTHROSCOPY     RIGHT KNEE  . LEFT HEART CATHETERIZATION WITH CORONARY ANGIOGRAM N/A 07/04/2012   Procedure: LEFT HEART CATHETERIZATION WITH CORONARY ANGIOGRAM;  Surgeon: Sherren Mocha, MD;  Location: [facility];  Service: Cardiovascular;  Laterality: N/A;  . MITRAL VALVE REPLACEMENT     No LAA occlusion at the time of surgery  Karie Soda report 2018  . RIGHT/LEFT HEART CATH AND CORONARY/GRAFT ANGIOGRAPHY  N/A 01/05/2017   Procedure: RIGHT/LEFT HEART CATH AND CORONARY/GRAFT ANGIOGRAPHY;  Surgeon: Sherren Mocha, MD;  Location: [place] [facility];  Service: Cardiovascular;  Laterality: N/A;  . TRANSTHORACIC ECHOCARDIOGRAM  04/02/2007   EF 35%    Family History  Problem Relation Age of Onset  . Heart disease Father   . Emphysema Father   . Stroke Mother   . Cancer Sister        breast cancer  . Heart disease Paternal Grandmother   . Heart disease Paternal Grandfather   . Diabetes Other        grandmother, aunt, uncle  . Cancer Other        lung cancer, aunt              Social History:  reports that he quit smoking about 47 years ago. His smoking use included cigarettes. He started smoking about 62 years ago. He has a 32.00 pack-year smoking history. He has never used smokeless tobacco. He reports current alcohol use. He reports that he does not use drugs.  Allergies  Allergen Reactions  . Latex Other (See Comments)    REDNESS AT REACTION SITE.  Marland Kitchen Metformin Diarrhea  . Rivaroxaban Other (See Comments)    Headaches, blurred vision  . Other Other (See Comments)    BEE STINGS   . Sotalol Other (See Comments)    "BLUE TOES"- cut his circulation off  . Warfarin Sodium Other (See Comments)    REACTION: migraine headaches/vision impariment    MEDICATIONS:  No current facility-administered medications on file prior to encounter.    Current Outpatient Medications on File Prior to Encounter  Medication Sig Dispense Refill  . acetaminophen (TYLENOL) 325 MG tablet Take 2 tablets (650 mg total) by mouth every 6 (six) hours as needed for mild pain (or Fever >/= 101).    Marland Kitchen amiodarone (PACERONE) 200 MG tablet Take 1 tablet (200 mg total) TWICE daily for one month, then reduce and take 1 tablet (200 mg daily) ONCE daily (Patient taking differently: Take 200 mg by mouth See admin  instructions. Take 1 tablet (200 mg total) TWICE daily for one month, then reduce and take 1 tablet (200 mg daily) ONCE daily) 60 tablet 6  . apixaban (ELIQUIS) 5 MG TABS tablet Take 1 tablet (5 mg total) by mouth 2 (two) times daily. 180 tablet 1  . atorvastatin (LIPITOR) 80 MG tablet Take 80 mg by mouth at bedtime.     . Cholecalciferol (VITAMIN D-3) 5000 UNITS TABS Take 5,000-10,000 Units by mouth See admin instructions. Take 5000 units by mouth on Sunday, Tuesday, Thursday, and Saturday. Take 10000 units by mouth on Monday, Wednesday, and Friday    . EPIPEN 2-PAK 0.3 MG/0.3ML SOAJ injection Inject 0.3 mg into the muscle daily as needed for anaphylaxis. USE ONLY IN EMERGENCY    . fluticasone (FLONASE) 50 MCG/ACT nasal spray Place 1 spray into the nose daily as needed for rhinitis or allergies.    . furosemide (LASIX) 20 MG tablet Take 1 tablet (20 mg total) by mouth daily as needed for fluid or edema.    Marland Kitchen ibuprofen (ADVIL,MOTRIN) 200 MG tablet Take 400 mg by mouth every 8 (eight) hours as needed for headache or moderate pain.     . Magnesium 250 MG TABS Take 250 mg by mouth daily.     . metoprolol succinate (TOPROL-XL) 50 MG 24 hr tablet Take 1 tablet (50 mg total) by mouth 2 (two) times daily. Take with or immediately following a meal. (Patient taking differently: Take 50 mg by mouth daily. Take with or immediately following a meal.) 180 tablet 3  . Multiple Vitamin (MULTIVITAMIN WITH MINERALS) TABS Take 1 tablet by mouth daily. Centrum Silver    . Multiple Vitamins-Minerals (PRESERVISION AREDS 2) CAPS Take 1 capsule by mouth 2 (two) times daily.     . nateglinide (STARLIX) 120 MG tablet Take 120 mg by mouth Twice daily.     . pioglitazone (ACTOS) 45 MG tablet Take 45 mg by mouth daily.      . sitaGLIPtin (JANUVIA) 100 MG tablet Take 100 mg by mouth at bedtime.     . Tiotropium Bromide-Olodaterol (STIOLTO RESPIMAT) 2.5-2.5 MCG/ACT AERS Inhale 2 puffs into the lungs daily. 1 Inhaler 5      ROS:  Does not endorse any other neurological symptoms, including no facial droop, dysarthria, dysphasia, limb weakness, limb numbness, dizziness, swallowing difficulty, or difficulty ambulating. Also with no F/C, SOB, CP, diarrhea, myalgia or rash.   Blood pressure (!) 138/95, pulse 62, temperature 98.2 F (36.8 C), temperature source Oral, resp. rate 19, height 6\' 2"  (1.88 m), weight 93.4 kg, SpO2 96 %.   General Examination:                                                                                                       Physical Exam  HEENT-  [place]/AT  Lungs- Respirations unlabored Extremities- No edema  Neurological Examination Mental Status: Alert, oriented, thought content appropriate.  Speech fluent without evidence of aphasia.  Able to follow all commands without difficulty. Cranial Nerves: II: Visual fields intact bilaterally. No extinction to DSS. Visual acuity at baseline. PERRL.   III,IV, VI: No ptosis. EOMI.  V,VII: Smile symmetric, facial temp sensation equal bilaterally VIII: hearing intact to voice IX,X: Palate rises symmetrically XI: Symmetric shoulder shrug XII: Midline tongue extension Motor: Right : Upper extremity   5/5    Left:     Upper extremity   5/5  Lower extremity   5/5     Lower extremity   5/5 Normal tone throughout; no atrophy noted No pronator drift.  Sensory: Temp and light touch intact x 4 with no extinction Deep Tendon Reflexes: 2+ bilateral upper extremities, 1+ patellae and un-elicitable achilles reflexes. Toes equivocal.  Cerebellar: No ataxia with FNF bilaterally Gait: Deferred   Lab Results: Basic Metabolic Panel: Recent Labs  Lab 11/04/18 1635 11/04/18 1711  NA 130* 133*  K 4.6 4.5  CL 99 98  CO2 23  --   GLUCOSE 133* 128*  BUN 12 14  CREATININE 1.00 0.90  CALCIUM 9.2  --      CBC: Recent Labs  Lab 11/04/18 1635 11/04/18 1711  WBC 5.6  --   NEUTROABS 4.0  --   HGB 14.1 14.6  HCT 41.2 43.0  MCV 103.0*  --   PLT 124*  --     Cardiac Enzymes: No results for input(s): CKTOTAL, CKMB, CKMBINDEX, TROPONINI in the last 168 hours.  Lipid Panel: Recent Labs  Lab 11/05/18 0256  CHOL 110  TRIG 90  HDL 48  CHOLHDL 2.3  VLDL 18  LDLCALC 44    Imaging: Ct Angio Head W Or Wo Contrast  Result Date: 11/04/2018 CLINICAL DATA:  Intermittent right eye vision loss EXAM: CT ANGIOGRAPHY HEAD AND NECK TECHNIQUE: Multidetector CT imaging of the head and neck was performed using the standard protocol during bolus administration of intravenous contrast. Multiplanar CT image reconstructions and MIPs were obtained to evaluate the vascular anatomy. Carotid stenosis measurements (when applicable) are obtained utilizing NASCET criteria, using the distal internal carotid diameter as the denominator. CONTRAST:  9mL OMNIPAQUE IOHEXOL 350 MG/ML SOLN COMPARISON:  CTA head neck 12/31/2017 FINDINGS: CTA NECK FINDINGS SKELETON: There is no bony spinal canal stenosis. No lytic or blastic lesion. OTHER NECK: Normal pharynx, larynx and major salivary glands.  No cervical lymphadenopathy. Unremarkable thyroid gland. UPPER CHEST: No pneumothorax or pleural effusion. No nodules or masses. AORTIC ARCH: There is mild calcific atherosclerosis of the aortic arch. There is no aneurysm, dissection or hemodynamically significant stenosis of the visualized portion of the aorta. Conventional 3 vessel aortic branching pattern. The visualized proximal subclavian arteries are widely patent. RIGHT CAROTID SYSTEM: No dissection, occlusion or aneurysm. There is mixed density atherosclerosis extending into the proximal ICA, resulting in approximately 90% stenosis. This stenosis has worsened from the prior study. LEFT CAROTID SYSTEM: No dissection, occlusion or aneurysm. Mild atherosclerotic calcification at the  carotid bifurcation without hemodynamically significant stenosis. VERTEBRAL ARTERIES: Left dominant configuration. Both origins are clearly patent. There is no dissection, occlusion or flow-limiting stenosis to the skull base (V1-V3 segments). CTA HEAD FINDINGS POSTERIOR CIRCULATION: --Vertebral arteries: Normal V4 segments. --Posterior inferior cerebellar arteries (PICA): Patent origins from the vertebral arteries. --Anterior inferior cerebellar arteries (AICA): Patent origins from the basilar artery. --Basilar artery: Normal. --Superior cerebellar arteries: Normal. --Posterior cerebral arteries: Normal. Both originate from the basilar artery. Posterior communicating arteries (p-comm) are diminutive or absent. ANTERIOR CIRCULATION: --Intracranial internal carotid arteries: Atherosclerotic calcification of the internal carotid arteries at the skull base without hemodynamically significant stenosis. --Anterior cerebral arteries (ACA): Normal. Both A1 segments are present. Patent anterior communicating artery (a-comm). --Middle cerebral arteries (MCA): Normal. VENOUS SINUSES: As permitted by contrast timing, patent. ANATOMIC VARIANTS: None Review of the MIP images confirms the above findings. IMPRESSION: 1. No emergent large vessel occlusion or high-grade stenosis of the intracranial arteries. 2. Increased, now approximately 90% stenosis of the proximal right internal carotid artery secondary to mixed density atherosclerosis. 3. Aortic Atherosclerosis (ICD10-I70.0). Electronically Signed   By: Ulyses Jarred M.D.   On: 11/04/2018 23:43   Ct Head Wo Contrast  Result Date: 11/04/2018 CLINICAL DATA:  Right eye blurred vision. Transient loss of right eye vision 2 days ago. History of atrial fibrillation. EXAM: CT HEAD WITHOUT CONTRAST TECHNIQUE: Contiguous axial images were obtained from the base of the skull through the vertex without intravenous contrast. COMPARISON:  CT head 03/04/2018. FINDINGS: Brain: There is no  evidence of acute intracranial hemorrhage, mass lesion, brain edema or extra-axial fluid collection. The ventricles and subarachnoid spaces are appropriately sized for age. Mild chronic small vessel ischemic changes in the periventricular white matter are stable. There is no evidence of acute cortical stroke. Vascular: Intracranial vascular calcifications. No hyperdense vessel identified. Skull: Negative for fracture or focal lesion. Sinuses/Orbits: Minimal ethmoid sinus mucosal thickening and small bilateral mastoid effusions without coalescence. No orbital abnormalities. Other: None. IMPRESSION: Stable head CT without acute findings or explanation for the patient's symptoms. Stable mild chronic small vessel ischemic changes. Electronically Signed   By: Richardean Sale M.D.   On: 11/04/2018 17:09   Ct Angio Neck W And/or Wo Contrast  Result Date: 11/04/2018 CLINICAL DATA:  Intermittent right eye vision loss EXAM: CT ANGIOGRAPHY HEAD AND NECK TECHNIQUE: Multidetector CT imaging of the head and neck was performed using the standard protocol during bolus administration of intravenous contrast. Multiplanar CT image reconstructions and MIPs were obtained to evaluate the vascular anatomy. Carotid stenosis measurements (when applicable) are obtained utilizing NASCET criteria, using the distal internal carotid diameter as the denominator. CONTRAST:  35mL OMNIPAQUE IOHEXOL 350 MG/ML SOLN COMPARISON:  CTA head neck 12/31/2017 FINDINGS: CTA NECK FINDINGS SKELETON: There is no bony spinal canal stenosis. No lytic or blastic lesion. OTHER NECK: Normal pharynx, larynx and major salivary glands. No cervical lymphadenopathy. Unremarkable  thyroid gland. UPPER CHEST: No pneumothorax or pleural effusion. No nodules or masses. AORTIC ARCH: There is mild calcific atherosclerosis of the aortic arch. There is no aneurysm, dissection or hemodynamically significant stenosis of the visualized portion of the aorta. Conventional 3 vessel  aortic branching pattern. The visualized proximal subclavian arteries are widely patent. RIGHT CAROTID SYSTEM: No dissection, occlusion or aneurysm. There is mixed density atherosclerosis extending into the proximal ICA, resulting in approximately 90% stenosis. This stenosis has worsened from the prior study. LEFT CAROTID SYSTEM: No dissection, occlusion or aneurysm. Mild atherosclerotic calcification at the carotid bifurcation without hemodynamically significant stenosis. VERTEBRAL ARTERIES: Left dominant configuration. Both origins are clearly patent. There is no dissection, occlusion or flow-limiting stenosis to the skull base (V1-V3 segments). CTA HEAD FINDINGS POSTERIOR CIRCULATION: --Vertebral arteries: Normal V4 segments. --Posterior inferior cerebellar arteries (PICA): Patent origins from the vertebral arteries. --Anterior inferior cerebellar arteries (AICA): Patent origins from the basilar artery. --Basilar artery: Normal. --Superior cerebellar arteries: Normal. --Posterior cerebral arteries: Normal. Both originate from the basilar artery. Posterior communicating arteries (p-comm) are diminutive or absent. ANTERIOR CIRCULATION: --Intracranial internal carotid arteries: Atherosclerotic calcification of the internal carotid arteries at the skull base without hemodynamically significant stenosis. --Anterior cerebral arteries (ACA): Normal. Both A1 segments are present. Patent anterior communicating artery (a-comm). --Middle cerebral arteries (MCA): Normal. VENOUS SINUSES: As permitted by contrast timing, patent. ANATOMIC VARIANTS: None Review of the MIP images confirms the above findings. IMPRESSION: 1. No emergent large vessel occlusion or high-grade stenosis of the intracranial arteries. 2. Increased, now approximately 90% stenosis of the proximal right internal carotid artery secondary to mixed density atherosclerosis. 3. Aortic Atherosclerosis (ICD10-I70.0). Electronically Signed   By: Ulyses Jarred M.D.    On: 11/04/2018 23:43   Dg Chest Port 1 View  Result Date: 11/04/2018 CLINICAL DATA:  TIA EXAM: PORTABLE CHEST 1 VIEW COMPARISON:  March 04, 2018 FINDINGS: There is cardiomegaly. Overlying median sternotomy wires. Left-sided pacemaker seen with the tips in the right atrium and right ventricle. Both lungs are clear. No acute osseous abnormality. IMPRESSION: No acute cardiopulmonary process.  Stable cardiomegaly Electronically Signed   By: Prudencio Pair M.D.   On: 11/04/2018 23:21    Assessment: 85 year old male with amaurosis fugax, right eye.  1. Vision back to baseline. Ophthalmologist felt that it was most likely secondary to a CRAO from embolic phenomenon.  2. Stroke risk factors: Atrial fibrillation, CAD, DM and history of mitral valve endocarditis.   3. The patient takes Eliquis at home and has been compliant.  4. CT head shows mild chronic small vessel ischemic changes.  5. CTA of head reveals no emergent large vessel occlusion or high-grade stenosis of the intracranial arteries. There is increased, now approximately 90% stenosis of the proximal right internal carotid artery secondary to mixed density atherosclerosis. Aortic atherosclerosis is also noted.  6. Based on the CTA neck findings, the amaurosis fugax episodes are felt most likely to be secondary to distal embolization from unstable right ICA atherosclerotic plaque. Vascular Surgery consult for possible expedited right CEA is indicated.   Recommendations: 1. Vascular surgery consult 2. Carotid ultrasound 3. TTE 4. Continue Eliquis.  5. Unable to obtain MRI due to AICD that is not MRI compatible.  6. Cardiac telemetry 7. Frequent neuro checks  Electronically signed: Dr. Kerney Elbe 11/05/2018, 6:42 AM

## 2018-11-05 NOTE — Telephone Encounter (Signed)
New message:    Patient wife calling stating that she would like for some to call concering husband he is the hospital.

## 2018-11-05 NOTE — Discharge Instructions (Addendum)
Information on my medicine - ELIQUIS (apixaban)  Why was Eliquis prescribed for you? Eliquis was prescribed for you to reduce the risk of a blood clot forming that can cause a stroke if you have a medical condition called atrial fibrillation (a type of irregular heartbeat).  What do You need to know about Eliquis ? Take your Eliquis TWICE DAILY - one tablet in the morning and one tablet in the evening with or without food. If you have difficulty swallowing the tablet whole please discuss with your pharmacist how to take the medication safely.  Take Eliquis exactly as prescribed by your doctor and DO NOT stop taking Eliquis without talking to the doctor who prescribed the medication.  Stopping may increase your risk of developing a stroke.  Refill your prescription before you run out.  After discharge, you should have regular check-up appointments with your healthcare provider that is prescribing your Eliquis.  In the future your dose may need to be changed if your kidney function or weight changes by a significant amount or as you get older.  What do you do if you miss a dose? If you miss a dose, take it as soon as you remember on the same day and resume taking twice daily.  Do not take more than one dose of ELIQUIS at the same time to make up a missed dose.  Important Safety Information A possible side effect of Eliquis is bleeding. You should call your healthcare provider right away if you experience any of the following: ? Bleeding from an injury or your nose that does not stop. ? Unusual colored urine (red or dark brown) or unusual colored stools (red or black). ? Unusual bruising for unknown reasons. ? A serious fall or if you hit your head (even if there is no bleeding).  Some medicines may interact with Eliquis and might increase your risk of bleeding or clotting while on Eliquis. To help avoid this, consult your healthcare provider or pharmacist prior to using any new  prescription or non-prescription medications, including herbals, vitamins, non-steroidal anti-inflammatory drugs (NSAIDs) and supplements.  This website has more information on Eliquis (apixaban): http://www.eliquis.com/eliquis/home    [facility]  Discharge Instructions   Carotid Endarterectomy (CEA)  Please refer to the following instructions for your post-procedure care. Your surgeon or physician assistant will discuss any changes with you.  Activity  You are encouraged to walk as much as you can. You can slowly return to normal activities but must avoid strenuous activity and heavy lifting until your doctor tell you it's okay. Avoid activities such as vacuuming or swinging a golf club. You can drive after one week if you are comfortable and you are no longer taking prescription pain medications. It is normal to feel tired for serval weeks after your surgery. It is also normal to have difficulty with sleep habits, eating, and bowel movements after surgery. These will go away with time.  Bathing/Showering  Shower daily after you go home. Do not soak in a bathtub, hot tub, or swim until the incision heals completely.  Incision Care  Shower every day. Clean your incision with mild soap and water. Pat the area dry with a clean towel. You do not need a bandage unless otherwise instructed. Do not apply any ointments or creams to your incision. You may have skin glue on your incision. Do not peel it off. It will come off on its own in about one week. Your incision may feel  thickened and raised for several weeks after your surgery. This is normal and the skin will soften over time.   For Men Only: It's okay to shave around the incision but do not shave the incision itself for 2 weeks. It is common to have numbness under your chin that could last for several months.  Diet  Resume your normal diet. There are no special food restrictions following this  procedure. A low fat/low cholesterol diet is recommended for all patients with vascular disease. In order to heal from your surgery, it is CRITICAL to get adequate nutrition. Your body requires vitamins, minerals, and protein. Vegetables are the best source of vitamins and minerals. Vegetables also provide the perfect balance of protein. Processed food has little nutritional value, so try to avoid this.  Medications  Resume taking all of your medications unless your doctor or physician assistant tells you not to. If your incision is causing pain, you may take over-the- counter pain relievers such as acetaminophen (Tylenol). If you were prescribed a stronger pain medication, please be aware these medications can cause nausea and constipation. Prevent nausea by taking the medication with a snack or meal. Avoid constipation by drinking plenty of fluids and eating foods with a high amount of fiber, such as fruits, vegetables, and grains.  Do not take Tylenol if you are taking prescription pain medications.  Follow Up  Our office will schedule a follow up appointment 2-3 weeks following discharge.  Please call us immediately for any of the following conditions   Increased pain, redness, drainage (pus) from your incision site.  Fever of 101 degrees or higher.  If you should develop stroke (slurred speech, difficulty swallowing, weakness on one side of your body, loss of vision) you should call 911 and go to the nearest emergency room.   Reduce your risk of vascular disease:   Stop smoking. If you would like help call [facility] at 1-800-QUIT-NOW 805-467-6874) or [place] at ([phone].  Manage your cholesterol  Maintain a desired weight  Control your diabetes  Keep your blood pressure down   If you have any questions, please call the office at [phone].

## 2018-11-05 NOTE — Evaluation (Signed)
Occupational Therapy Evaluation Patient Details Name: [name] MRN: [id number] DOB: May 20, 1931 Today's Date: 11/05/2018    History of Present Illness Admitted with amaurosis fugax, or transient blindness, Amaurosis fugax (transient monocular blindness) is a symptom of transient retinal ischemia. Like fleeting hemiparesis or unilateral sensory loss it may progress to permanent blindness or stroke; stroke workup underway   Work-up has included CTA neck which demonstrates a 90% stenosis of right proximal ICA.  He denies any other strokelike symptoms including slurring speech or one-sided weakness.   Clinical Impression   OT eval complete.  Pt overall mod I with ADL activity. No further OT indicated.                 Mobility Bed Mobility               General bed mobility comments: pt OOB  Transfers Overall transfer level: Modified independent                    Balance Overall balance assessment: Mild deficits observed, not formally tested                                         ADL either performed or assessed with clinical judgement   ADL Overall ADL's : Modified independent                                             Vision Patient Visual Report: No change from baseline(back to normal)              Pertinent Vitals/Pain Pain Assessment: No/denies pain        Extremity/Trunk Assessment Upper Extremity Assessment Upper Extremity Assessment: Overall WFL for tasks assessed           Communication Communication Communication: No difficulties   Cognition Arousal/Alertness: Awake/alert Behavior During Therapy: WFL for tasks assessed/performed Overall Cognitive Status: Within Functional Limits for tasks assessed                                                Home Living Family/patient expects to be discharged to:: Private residence Living Arrangements: Spouse/significant  other Available Help at Discharge: Family;Available 24 hours/day Type of Home: House       Home Layout: One level     Bathroom Shower/Tub: Walk-in shower         Home Equipment: Shower seat;Bedside commode;Walker - 2 wheels;Cane - single point          Prior Functioning/Environment Level of Independence: Independent                          OT Goals(Current goals can be found in the care plan section) Acute Rehab OT Goals Patient Stated Goal: have surgery and get home OT Goal Formulation: With patient  OT Frequency:      AM-PAC OT "6 Clicks" Daily Activity     Outcome Measure Help from another person eating meals?: None Help from another person taking care of personal grooming?: None Help from another person toileting, which includes using toliet, bedpan, or urinal?: None Help from  another person bathing (including washing, rinsing, drying)?: None Help from another person to put on and taking off regular upper body clothing?: None Help from another person to put on and taking off regular lower body clothing?: None 6 Click Score: 24   End of Session    Activity Tolerance: Patient tolerated treatment well Patient left: in chair                   Time: 1130-1141 OT Time Calculation (min): 11 min Charges:  OT General Charges $OT Visit: 1 Visit OT Evaluation $OT Eval Low Complexity: 1 Low  Lise Auer, OT Acute Rehabilitation Services [phone] Office- [phone]     Mirza Fessel, Karin Golden D 11/05/2018, 11:57 AM

## 2018-11-05 NOTE — Progress Notes (Signed)
Neurology team at bedside.

## 2018-11-05 NOTE — ED Notes (Signed)
PT TRANSFERED TO HOSPITAL BED 

## 2018-11-05 NOTE — Progress Notes (Signed)
SLP Cancellation Note  Patient Details Name: [name] MRN: [id number] DOB: [birth year]   Cancelled treatment:       Reason Eval/Treat Not Completed: SLP screened, no needs identified, will sign off   Shyann Hefner, Katherene Ponto 11/05/2018, 8:21 AM

## 2018-11-06 DIAGNOSIS — Z95810 Presence of automatic (implantable) cardiac defibrillator: Secondary | ICD-10-CM

## 2018-11-06 LAB — GLUCOSE, CAPILLARY
Glucose-Capillary: 101 mg/dL — ABNORMAL HIGH (ref 70–99)
Glucose-Capillary: 128 mg/dL — ABNORMAL HIGH (ref 70–99)
Glucose-Capillary: 168 mg/dL — ABNORMAL HIGH (ref 70–99)
Glucose-Capillary: 93 mg/dL (ref 70–99)

## 2018-11-06 LAB — HEPARIN LEVEL (UNFRACTIONATED): Heparin Unfractionated: 1.98 IU/mL — ABNORMAL HIGH (ref 0.30–0.70)

## 2018-11-06 LAB — APTT
aPTT: 35 seconds (ref 24–36)
aPTT: 160 seconds — ABNORMAL HIGH (ref 24–36)

## 2018-11-06 MED ORDER — AMIODARONE HCL 200 MG PO TABS
200.0000 mg | ORAL_TABLET | Freq: Two times a day (BID) | ORAL | Status: DC
  Administered 2018-11-06 – 2018-11-08 (×4): 200 mg via ORAL
  Filled 2018-11-06 (×4): qty 1

## 2018-11-06 MED ORDER — ARFORMOTEROL TARTRATE 15 MCG/2ML IN NEBU
15.0000 ug | INHALATION_SOLUTION | Freq: Two times a day (BID) | RESPIRATORY_TRACT | Status: DC
  Administered 2018-11-07 – 2018-11-08 (×2): 15 ug via RESPIRATORY_TRACT
  Filled 2018-11-06 (×4): qty 2

## 2018-11-06 MED ORDER — HEPARIN (PORCINE) 25000 UT/250ML-% IV SOLN
1200.0000 [IU]/h | INTRAVENOUS | Status: DC
  Administered 2018-11-06: 1300 [IU]/h via INTRAVENOUS
  Filled 2018-11-06 (×2): qty 250

## 2018-11-06 MED ORDER — UMECLIDINIUM BROMIDE 62.5 MCG/INH IN AEPB
1.0000 | INHALATION_SPRAY | Freq: Every day | RESPIRATORY_TRACT | Status: DC
  Filled 2018-11-06 (×2): qty 7

## 2018-11-06 NOTE — Progress Notes (Signed)
ANTICOAGULATION CONSULT NOTE - Follow Up Consult  Pharmacy Consult for Heparin (while off Eliquis for surgery) Indication: atrial fibrillation  Allergies  Allergen Reactions  . Latex Other (See Comments)    REDNESS AT REACTION SITE.  Marland Kitchen Metformin Diarrhea  . Rivaroxaban Other (See Comments)    Headaches, blurred vision  . Other Other (See Comments)    BEE STINGS   . Sotalol Other (See Comments)    "BLUE TOES"- cut his circulation off  . Warfarin Sodium Other (See Comments)    REACTION: migraine headaches/vision impariment    Patient Measurements: Height: 6\' 2"  (188 cm) Weight: 206 lb (93.4 kg) IBW/kg (Calculated) : 82.2 Heparin Dosing Weight: 93.4 kg  Vital Signs: Temp: 98.2 F (36.8 C) (10/07 1727) Temp Source: Oral (10/07 1727) BP: 121/73 (10/07 1727) Pulse Rate: 57 (10/07 1727)  Labs: Recent Labs    11/04/18 1635 11/04/18 1711 11/04/18 2330 11/05/18 0046 11/06/18 0906 11/06/18 1951  HGB 14.1 14.6  --   --   --   --   HCT 41.2 43.0  --   --   --   --   PLT 124*  --   --   --   --   --   APTT 35  --   --   --  35 160*  LABPROT 16.7*  --   --   --   --   --   INR 1.4*  --   --   --   --   --   HEPARINUNFRC  --   --   --   --  1.98*  --   CREATININE 1.00 0.90  --   --   --   --   TROPONINIHS  --   --  13 14  --   --     Estimated Creatinine Clearance: 69.8 mL/min (by C-G formula based on SCr of 0.9 mg/dL).  Assessment:  85 yr old male on Eliquis 5 mg BID prior to admission for atrial fibrillation.  Now with Amaurosis fugax of right eye. Transitioning to IV heparin today for R CEA on 10/8.  Last Eliquis dose ~10pm on 10/6.  Initial aPTT 160 sec,  Rate confirmed and no bleeding per RN report.  Goal of Therapy:  Heparin level 0.3-0.7 units/ml aPTT 66-102 seconds Monitor platelets by anticoagulation protocol: Yes   Plan:  Decrease heparin gtt to 1100 units/hr F/u aPTT with AM labs  Bertis Ruddy, PharmD Clinical Pharmacist Please check AMION for all  [place] numbers 11/06/2018 8:34 PM

## 2018-11-06 NOTE — Progress Notes (Signed)
PROGRESS NOTE    [name]  [id number] DOB: 1931-10-31 DOA: 11/04/2018 PCP: [name], MD     Brief Narrative:  [name] an 85 y.o.malewith medical history significant of history of CAD, status post AICD placed for history of polymorphic V. Tach, chronic systolic CHF, DM 2, endocarditis of mitral valve, paroxysmal atrial fibrillation who presents withtemporary loss of vision in the right eye which happened 2 wks ago as well. He was admitted for a TIA work up and found to have right carotid artery stenosis.  New events last 24 hours / Subjective: Feeling well this morning, had another episode of right-sided vision loss yesterday after carotid artery ultrasound.  Resolved currently.  Assessment & Plan:   Principal Problem:   Amaurosis fugax of right eye Active Problems:   ATRIAL FIBRILLATION   Automatic implantable cardioverter-defibrillator in situ   Asthma-COPD overlap syndrome (HCC)   Type 2 diabetes mellitus with complication, without long-term current use of insulin (HCC)   TIA (transient ischemic attack)   CAD (coronary artery disease)   Hyponatremia   Preop cardiovascular exam   Amaurosis fugax of right eye, right carotid artery stenosis -Vascular surgery planning for right CEA 10/8 -Appreciate cardiology for cardiac preop evaluation  Paroxysmal atrial fibrillation -CHA2DS2-VASc score 8 -Hold Eliquis, bridged with IV heparin -Continue amiodarone, metoprolol  Asthma-COPD overlap syndrome -Stable, continue Respimat  Type 2 diabetes mellitus without long-term use of insulin, well controlled -Ha1c 6.8  -Hold home medications -Continue sliding scale insulin while in the hospital  CAD status post CABG -Continue metoprolol, statin, aspirin   Chronic systolic and diastolic heart failure -Stable, continue metoprolol   DVT prophylaxis: IV heparin Code Status: Full code Family Communication: None Disposition Plan: Right CEA planned for  tomorrow   Consultants:   Vascular surgery  Neurology  Cardiology   Antimicrobials:  Anti-infectives (From admission, onward)   None        Objective: Vitals:   11/06/18 0014 11/06/18 0509 11/06/18 0814 11/06/18 1004  BP: 111/70 128/73 114/74 140/81  Pulse: 65 63 60 65  Resp: 17 18 18 18   Temp: 97.9 F (36.6 C) 98.1 F (36.7 C) 97.9 F (36.6 C) 98.2 F (36.8 C)  TempSrc: Oral Oral Oral Oral  SpO2: 96% 96% 98% 96%  Weight:      Height:        Intake/Output Summary (Last 24 hours) at 11/06/2018 1212 Last data filed at 11/06/2018 1020 Gross per 24 hour  Intake 1302.72 ml  Output --  Net 1302.72 ml   Filed Weights   11/05/18 0459  Weight: 93.4 kg    Examination:  General exam: Appears calm and comfortable  Respiratory system: Clear to auscultation. Respiratory effort normal. No respiratory distress. No conversational dyspnea.  Cardiovascular system: S1 & S2 heard. No murmurs. No pedal edema. Gastrointestinal system: Abdomen is nondistended, soft and nontender. Normal bowel sounds heard. Central nervous system: Alert and oriented. No focal neurological deficits. Speech clear.  Extremities: Symmetric in appearance  Skin: No rashes, lesions or ulcers on exposed skin  Psychiatry: Judgement and insight appear normal. Mood & affect appropriate.   Data Reviewed: I have personally reviewed following labs and imaging studies  CBC: Recent Labs  Lab 11/04/18 1635 11/04/18 1711  WBC 5.6  --   NEUTROABS 4.0  --   HGB 14.1 14.6  HCT 41.2 43.0  MCV 103.0*  --   PLT 124*  --    Basic Metabolic Panel: Recent Labs  Lab  11/04/18 1635 11/04/18 1711  NA 130* 133*  K 4.6 4.5  CL 99 98  CO2 23  --   GLUCOSE 133* 128*  BUN 12 14  CREATININE 1.00 0.90  CALCIUM 9.2  --    GFR: Estimated Creatinine Clearance: 69.8 mL/min (by C-G formula based on SCr of 0.9 mg/dL). Liver Function Tests: Recent Labs  Lab 11/04/18 1635  AST 27  ALT 23  ALKPHOS 89  BILITOT  2.5*  PROT 7.5  ALBUMIN 4.3   No results for input(s): LIPASE, AMYLASE in the last 168 hours. No results for input(s): AMMONIA in the last 168 hours. Coagulation Profile: Recent Labs  Lab 11/04/18 1635  INR 1.4*   Cardiac Enzymes: No results for input(s): CKTOTAL, CKMB, CKMBINDEX, TROPONINI in the last 168 hours. BNP (last 3 results) Recent Labs    12/18/17 1506  PROBNP 1,465*   HbA1C: Recent Labs    11/04/18 2330  HGBA1C 6.8*   CBG: Recent Labs  Lab 11/05/18 1240 11/05/18 1626 11/05/18 2123 11/06/18 0604 11/06/18 1113  GLUCAP 209* 101* 122* 101* 128*   Lipid Profile: Recent Labs    11/05/18 0256  CHOL 110  HDL 48  LDLCALC 44  TRIG 90  CHOLHDL 2.3   Thyroid Function Tests: No results for input(s): TSH, T4TOTAL, FREET4, T3FREE, THYROIDAB in the last 72 hours. Anemia Panel: No results for input(s): VITAMINB12, FOLATE, FERRITIN, TIBC, IRON, RETICCTPCT in the last 72 hours. Sepsis Labs: No results for input(s): PROCALCITON, LATICACIDVEN in the last 168 hours.  Recent Results (from the past 240 hour(s))  SARS CORONAVIRUS 2 (TAT 6-24 HRS) Nasopharyngeal Nasopharyngeal Swab     Status: None   Collection Time: 11/04/18 10:12 PM   Specimen: Nasopharyngeal Swab  Result Value Ref Range Status   SARS Coronavirus 2 NEGATIVE NEGATIVE Final    Comment: (NOTE) SARS-CoV-2 target nucleic acids are NOT DETECTED. The SARS-CoV-2 RNA is generally detectable in upper and lower respiratory specimens during the acute phase of infection. Negative results do not preclude SARS-CoV-2 infection, do not rule out co-infections with other pathogens, and should not be used as the sole basis for treatment or other patient management decisions. Negative results must be combined with clinical observations, patient history, and epidemiological information. The expected result is Negative. Fact Sheet for Patients: SugarRoll.be Fact Sheet for Healthcare  Providers: https://www.woods-mathews.com/ This test is not yet approved or cleared by the [place] FDA and  has been authorized for detection and/or diagnosis of SARS-CoV-2 by FDA under an Emergency Use Authorization (EUA). This EUA will remain  in effect (meaning this test can be used) for the duration of the COVID-19 declaration under Section 56 4(b)(1) of the Act, 21 U.S.C. section 360bbb-3(b)(1), unless the authorization is terminated or revoked sooner. Performed at [facility], [place] [address], [place], [place] [address]       Radiology Studies: Ct Angio Head W Or Wo Contrast  Result Date: 11/04/2018 CLINICAL DATA:  Intermittent right eye vision loss EXAM: CT ANGIOGRAPHY HEAD AND NECK TECHNIQUE: Multidetector CT imaging of the head and neck was performed using the standard protocol during bolus administration of intravenous contrast. Multiplanar CT image reconstructions and MIPs were obtained to evaluate the vascular anatomy. Carotid stenosis measurements (when applicable) are obtained utilizing NASCET criteria, using the distal internal carotid diameter as the denominator. CONTRAST:  60mL OMNIPAQUE IOHEXOL 350 MG/ML SOLN COMPARISON:  CTA head neck 12/31/2017 FINDINGS: CTA NECK FINDINGS SKELETON: There is no bony spinal canal stenosis. No  lytic or blastic lesion. OTHER NECK: Normal pharynx, larynx and major salivary glands. No cervical lymphadenopathy. Unremarkable thyroid gland. UPPER CHEST: No pneumothorax or pleural effusion. No nodules or masses. AORTIC ARCH: There is mild calcific atherosclerosis of the aortic arch. There is no aneurysm, dissection or hemodynamically significant stenosis of the visualized portion of the aorta. Conventional 3 vessel aortic branching pattern. The visualized proximal subclavian arteries are widely patent. RIGHT CAROTID SYSTEM: No dissection, occlusion or aneurysm. There is mixed density atherosclerosis extending into the proximal ICA,  resulting in approximately 90% stenosis. This stenosis has worsened from the prior study. LEFT CAROTID SYSTEM: No dissection, occlusion or aneurysm. Mild atherosclerotic calcification at the carotid bifurcation without hemodynamically significant stenosis. VERTEBRAL ARTERIES: Left dominant configuration. Both origins are clearly patent. There is no dissection, occlusion or flow-limiting stenosis to the skull base (V1-V3 segments). CTA HEAD FINDINGS POSTERIOR CIRCULATION: --Vertebral arteries: Normal V4 segments. --Posterior inferior cerebellar arteries (PICA): Patent origins from the vertebral arteries. --Anterior inferior cerebellar arteries (AICA): Patent origins from the basilar artery. --Basilar artery: Normal. --Superior cerebellar arteries: Normal. --Posterior cerebral arteries: Normal. Both originate from the basilar artery. Posterior communicating arteries (p-comm) are diminutive or absent. ANTERIOR CIRCULATION: --Intracranial internal carotid arteries: Atherosclerotic calcification of the internal carotid arteries at the skull base without hemodynamically significant stenosis. --Anterior cerebral arteries (ACA): Normal. Both A1 segments are present. Patent anterior communicating artery (a-comm). --Middle cerebral arteries (MCA): Normal. VENOUS SINUSES: As permitted by contrast timing, patent. ANATOMIC VARIANTS: None Review of the MIP images confirms the above findings. IMPRESSION: 1. No emergent large vessel occlusion or high-grade stenosis of the intracranial arteries. 2. Increased, now approximately 90% stenosis of the proximal right internal carotid artery secondary to mixed density atherosclerosis. 3. Aortic Atherosclerosis (ICD10-I70.0). Electronically Signed   By: Ulyses Jarred M.D.   On: 11/04/2018 23:43   Ct Head Wo Contrast  Result Date: 11/04/2018 CLINICAL DATA:  Right eye blurred vision. Transient loss of right eye vision 2 days ago. History of atrial fibrillation. EXAM: CT HEAD WITHOUT  CONTRAST TECHNIQUE: Contiguous axial images were obtained from the base of the skull through the vertex without intravenous contrast. COMPARISON:  CT head 03/04/2018. FINDINGS: Brain: There is no evidence of acute intracranial hemorrhage, mass lesion, brain edema or extra-axial fluid collection. The ventricles and subarachnoid spaces are appropriately sized for age. Mild chronic small vessel ischemic changes in the periventricular white matter are stable. There is no evidence of acute cortical stroke. Vascular: Intracranial vascular calcifications. No hyperdense vessel identified. Skull: Negative for fracture or focal lesion. Sinuses/Orbits: Minimal ethmoid sinus mucosal thickening and small bilateral mastoid effusions without coalescence. No orbital abnormalities. Other: None. IMPRESSION: Stable head CT without acute findings or explanation for the patient's symptoms. Stable mild chronic small vessel ischemic changes. Electronically Signed   By: Richardean Sale M.D.   On: 11/04/2018 17:09   Ct Angio Neck W And/or Wo Contrast  Result Date: 11/04/2018 CLINICAL DATA:  Intermittent right eye vision loss EXAM: CT ANGIOGRAPHY HEAD AND NECK TECHNIQUE: Multidetector CT imaging of the head and neck was performed using the standard protocol during bolus administration of intravenous contrast. Multiplanar CT image reconstructions and MIPs were obtained to evaluate the vascular anatomy. Carotid stenosis measurements (when applicable) are obtained utilizing NASCET criteria, using the distal internal carotid diameter as the denominator. CONTRAST:  47mL OMNIPAQUE IOHEXOL 350 MG/ML SOLN COMPARISON:  CTA head neck 12/31/2017 FINDINGS: CTA NECK FINDINGS SKELETON: There is no bony spinal canal stenosis. No lytic or blastic lesion.  OTHER NECK: Normal pharynx, larynx and major salivary glands. No cervical lymphadenopathy. Unremarkable thyroid gland. UPPER CHEST: No pneumothorax or pleural effusion. No nodules or masses. AORTIC  ARCH: There is mild calcific atherosclerosis of the aortic arch. There is no aneurysm, dissection or hemodynamically significant stenosis of the visualized portion of the aorta. Conventional 3 vessel aortic branching pattern. The visualized proximal subclavian arteries are widely patent. RIGHT CAROTID SYSTEM: No dissection, occlusion or aneurysm. There is mixed density atherosclerosis extending into the proximal ICA, resulting in approximately 90% stenosis. This stenosis has worsened from the prior study. LEFT CAROTID SYSTEM: No dissection, occlusion or aneurysm. Mild atherosclerotic calcification at the carotid bifurcation without hemodynamically significant stenosis. VERTEBRAL ARTERIES: Left dominant configuration. Both origins are clearly patent. There is no dissection, occlusion or flow-limiting stenosis to the skull base (V1-V3 segments). CTA HEAD FINDINGS POSTERIOR CIRCULATION: --Vertebral arteries: Normal V4 segments. --Posterior inferior cerebellar arteries (PICA): Patent origins from the vertebral arteries. --Anterior inferior cerebellar arteries (AICA): Patent origins from the basilar artery. --Basilar artery: Normal. --Superior cerebellar arteries: Normal. --Posterior cerebral arteries: Normal. Both originate from the basilar artery. Posterior communicating arteries (p-comm) are diminutive or absent. ANTERIOR CIRCULATION: --Intracranial internal carotid arteries: Atherosclerotic calcification of the internal carotid arteries at the skull base without hemodynamically significant stenosis. --Anterior cerebral arteries (ACA): Normal. Both A1 segments are present. Patent anterior communicating artery (a-comm). --Middle cerebral arteries (MCA): Normal. VENOUS SINUSES: As permitted by contrast timing, patent. ANATOMIC VARIANTS: None Review of the MIP images confirms the above findings. IMPRESSION: 1. No emergent large vessel occlusion or high-grade stenosis of the intracranial arteries. 2. Increased, now  approximately 90% stenosis of the proximal right internal carotid artery secondary to mixed density atherosclerosis. 3. Aortic Atherosclerosis (ICD10-I70.0). Electronically Signed   By: Ulyses Jarred M.D.   On: 11/04/2018 23:43   Dg Chest Port 1 View  Result Date: 11/04/2018 CLINICAL DATA:  TIA EXAM: PORTABLE CHEST 1 VIEW COMPARISON:  March 04, 2018 FINDINGS: There is cardiomegaly. Overlying median sternotomy wires. Left-sided pacemaker seen with the tips in the right atrium and right ventricle. Both lungs are clear. No acute osseous abnormality. IMPRESSION: No acute cardiopulmonary process.  Stable cardiomegaly Electronically Signed   By: Prudencio Pair M.D.   On: 11/04/2018 23:21   Vas US Carotid (at [place] Only)  Result Date: 11/06/2018 Carotid Arterial Duplex Study Indications: TIA. Performing Technologist: C BYNUM  Examination Guidelines: A complete evaluation includes B-mode imaging, spectral Doppler, color Doppler, and power Doppler as needed of all accessible portions of each vessel. Bilateral testing is considered an integral part of a complete examination. Limited examinations for reoccurring indications may be performed as noted.  Right Carotid Findings: +----------+--------+--------+--------+-------------------------------+--------+             PSV cm/s EDV cm/s Stenosis Plaque Description              Comments  +----------+--------+--------+--------+-------------------------------+--------+  CCA Prox   50       11                                                          +----------+--------+--------+--------+-------------------------------+--------+  CCA Distal 43       13                                                          +----------+--------+--------+--------+-------------------------------+--------+  ICA Prox   496      162      80-99%   focal, heterogenous and                                                          calcific                                   +----------+--------+--------+--------+-------------------------------+--------+  ICA Mid    72       13                                                          +----------+--------+--------+--------+-------------------------------+--------+  ICA Distal 39       17                                                          +----------+--------+--------+--------+-------------------------------+--------+  ECA        69       14                                                          +----------+--------+--------+--------+-------------------------------+--------+ +----------+--------+-------+----------------+-------------------+             PSV cm/s EDV cms Describe         Arm Pressure (mmHG)  +----------+--------+-------+----------------+-------------------+  Subclavian 146              Multiphasic, WNL                      +----------+--------+-------+----------------+-------------------+ +---------+--------+--+--------+--+---------+  Vertebral PSV cm/s 39 EDV cm/s 14 Antegrade  +---------+--------+--+--------+--+---------+  Left Carotid Findings: +----------+--------+--------+--------+-------------------------------+--------+             PSV cm/s EDV cm/s Stenosis Plaque Description              Comments  +----------+--------+--------+--------+-------------------------------+--------+  CCA Prox   90       22                                                          +----------+--------+--------+--------+-------------------------------+--------+  CCA Distal 62       16                focal, calcific and hyperechoic           +----------+--------+--------+--------+-------------------------------+--------+  ICA Prox   59       20                calcific                                  +----------+--------+--------+--------+-------------------------------+--------+  ICA Mid    67       26                                                           +----------+--------+--------+--------+-------------------------------+--------+  ICA Distal 52       19                                                          +----------+--------+--------+--------+-------------------------------+--------+  ECA        72       12                focal, hyperechoic and calcific           +----------+--------+--------+--------+-------------------------------+--------+ +----------+--------+--------+----------------+-------------------+             PSV cm/s EDV cm/s Describe         Arm Pressure (mmHG)  +----------+--------+--------+----------------+-------------------+  Subclavian 168               Multiphasic, WNL                      +----------+--------+--------+----------------+-------------------+ +---------+--------+--+--------+--+---------+  Vertebral PSV cm/s 42 EDV cm/s 11 Antegrade  +---------+--------+--+--------+--+---------+  Summary: Right Carotid: Velocities in the right ICA are consistent with a 80-99%                stenosis. Left Carotid: There is no evidence of stenosis in the left ICA. Vertebrals:  Bilateral vertebral arteries demonstrate antegrade flow. Subclavians: Normal flow hemodynamics were seen in bilateral subclavian              arteries. *See table(s) above for measurements and observations.  Electronically signed by Harold Barban MD on 11/06/2018 at 6:59:05 AM.    Final       Scheduled Meds:  amiodarone  200 mg Oral BID   arformoterol  15 mcg Nebulization BID   And   umeclidinium bromide  1 puff Inhalation Daily   aspirin EC  81 mg Oral Daily   atorvastatin  80 mg Oral QHS   insulin aspart  0-5 Units Subcutaneous QHS   insulin aspart  0-9 Units Subcutaneous TID WC   metoprolol succinate  50 mg Oral Daily   Continuous Infusions:  sodium chloride 75 mL/hr at 11/06/18 0830   heparin 1,300 Units/hr (11/06/18 1020)     LOS: 1 day      Time spent: 35 minutes   Dessa Phi, DO [facility] 11/06/2018, 12:12 PM    Available via Epic secure chat 7am-7pm After these hours, please refer to coverage provider listed on [email or web address]

## 2018-11-06 NOTE — Plan of Care (Signed)
Goals of care met

## 2018-11-06 NOTE — Progress Notes (Signed)
    Subjective  -   Had another episode of amaurosis last night which has resolved   Physical Exam:  Neuro intact Non-labored breathing abd soft       Assessment/Plan:    Symptomatic right carotid stenosis:  Patient is having recurrent symptoms. Cardiology evaluated him yesterday and felt he could proceed with surgery at moderate risk.  I feel right CEA is his best option.  We discussed the risks and benefits including stroke, nerve injury, bleeding.  All of his questions were answered.  He is scheduled for tomorrow with Dr.Cain  [name] 11/06/2018 10:16 AM --  Vitals:   11/06/18 0814 11/06/18 1004  BP: 114/74 140/81  Pulse: 60 65  Resp: 18 18  Temp: 97.9 F (36.6 C) 98.2 F (36.8 C)  SpO2: 98% 96%    Intake/Output Summary (Last 24 hours) at 11/06/2018 1016 Last data filed at 11/06/2018 0000 Gross per 24 hour  Intake 1062.72 ml  Output -  Net 1062.72 ml     Laboratory CBC    Component Value Date/Time   WBC 5.6 11/04/2018 1635   HGB 14.6 11/04/2018 1711   HGB 13.4 10/23/2018 1318   HCT 43.0 11/04/2018 1711   HCT 39.3 10/23/2018 1318   PLT 124 (L) 11/04/2018 1635   PLT 130 (L) 10/23/2018 1318    BMET    Component Value Date/Time   NA 133 (L) 11/04/2018 1711   NA 135 10/23/2018 1318   K 4.5 11/04/2018 1711   CL 98 11/04/2018 1711   CO2 23 11/04/2018 1635   GLUCOSE 128 (H) 11/04/2018 1711   BUN 14 11/04/2018 1711   BUN 16 10/23/2018 1318   CREATININE 0.90 11/04/2018 1711   CREATININE 1.08 01/04/2016 1630   CALCIUM 9.2 11/04/2018 1635   GFRNONAA >60 11/04/2018 1635   GFRAA >60 11/04/2018 1635    COAG Lab Results  Component Value Date   INR 1.4 (H) 11/04/2018   INR 1.1 12/27/2016   INR 1.0 07/03/2012   No results found for: PTT  Antibiotics Anti-infectives (From admission, onward)   None       V. Leia Alf, M.D., [facility] [place] Office: [phone] Pager:  [phone]

## 2018-11-06 NOTE — Progress Notes (Addendum)
ANTICOAGULATION CONSULT NOTE - Follow Up Consult  Pharmacy Consult for Heparin (while off Eliquis for surgery) Indication: atrial fibrillation  Allergies  Allergen Reactions  . Latex Other (See Comments)    REDNESS AT REACTION SITE.  Marland Kitchen Metformin Diarrhea  . Rivaroxaban Other (See Comments)    Headaches, blurred vision  . Other Other (See Comments)    BEE STINGS   . Sotalol Other (See Comments)    "BLUE TOES"- cut his circulation off  . Warfarin Sodium Other (See Comments)    REACTION: migraine headaches/vision impariment    Patient Measurements: Height: 6\' 2"  (188 cm) Weight: 206 lb (93.4 kg) IBW/kg (Calculated) : 82.2 Heparin Dosing Weight: 93.4 kg  Vital Signs: Temp: 98.2 F (36.8 C) (10/07 1004) Temp Source: Oral (10/07 1004) BP: 140/81 (10/07 1004) Pulse Rate: 65 (10/07 1004)  Labs: Recent Labs    11/04/18 1635 11/04/18 1711 11/04/18 2330 11/05/18 0046 11/06/18 0906  HGB 14.1 14.6  --   --   --   HCT 41.2 43.0  --   --   --   PLT 124*  --   --   --   --   APTT 35  --   --   --  35  LABPROT 16.7*  --   --   --   --   INR 1.4*  --   --   --   --   CREATININE 1.00 0.90  --   --   --   TROPONINIHS  --   --  13 14  --     Estimated Creatinine Clearance: 69.8 mL/min (by C-G formula based on SCr of 0.9 mg/dL).  Assessment:  85 yr old male on Eliquis 5 mg BID prior to admission for atrial fibrillation.  Now with Amaurosis fugax of right eye. Transitioning to IV heparin today for R CEA on 10/8.  Last Eliquis dose ~10pm on 10/6.    Baseline aPTT 35 seconds; baseline heparin 1.98, falsely high due to recent Eliquis doses.      Heparin monitoring using aPTTs for now.    Baseline platelet count low, seems to run 90s-150s.  Goal of Therapy:  Heparin level 0.3-0.7 units/ml aPTT 66-102 seconds Monitor platelets by anticoagulation protocol: Yes   Plan:   Heparin drip begun ~10am at 1300 units/hr (~12 hrs after last Eliquis dose)  aPTT ~8 hr after drip begins.  Daily aPTT, heparin level and CBC.  Heparin to be held on call to OR on 10/8 per Dr. Trula Slade.  Will follow up for timing of resuming anticoagulation post-op.  Eliquis on hold.  [name], [place] Pager: [phone] or phone: [phone] 11/06/2018,10:39 AM

## 2018-11-06 NOTE — Progress Notes (Signed)
STROKE TEAM PROGRESS NOTE   INTERVAL HISTORY Pt sitting in chair, no complains. VVS plan for CEA tomorrow. eliquis d/c'ed and now on heparin drip for bridge. Pt stated that when he was having CUS, he had another event with right eye amaurosis fugax for 5 min. Now back to normal.   Vitals:   11/06/18 0014 11/06/18 0509 11/06/18 0814 11/06/18 1004  BP: 111/70 128/73 114/74 140/81  Pulse: 65 63 60 65  Resp: 17 18 18 18   Temp: 97.9 F (36.6 C) 98.1 F (36.7 C) 97.9 F (36.6 C) 98.2 F (36.8 C)  TempSrc: Oral Oral Oral Oral  SpO2: 96% 96% 98% 96%  Weight:      Height:        CBC:  Recent Labs  Lab 11/04/18 1635 11/04/18 1711  WBC 5.6  --   NEUTROABS 4.0  --   HGB 14.1 14.6  HCT 41.2 43.0  MCV 103.0*  --   PLT 124*  --     Basic Metabolic Panel:  Recent Labs  Lab 11/04/18 1635 11/04/18 1711  NA 130* 133*  K 4.6 4.5  CL 99 98  CO2 23  --   GLUCOSE 133* 128*  BUN 12 14  CREATININE 1.00 0.90  CALCIUM 9.2  --    Lipid Panel:     Component Value Date/Time   CHOL 110 11/05/2018 0256   TRIG 90 11/05/2018 0256   HDL 48 11/05/2018 0256   CHOLHDL 2.3 11/05/2018 0256   VLDL 18 11/05/2018 0256   LDLCALC 44 11/05/2018 0256   HgbA1c:  Lab Results  Component Value Date   HGBA1C 6.8 (H) 11/04/2018   Urine Drug Screen:     Component Value Date/Time   LABOPIA NONE DETECTED 11/04/2018 1955   COCAINSCRNUR NONE DETECTED 11/04/2018 1955   LABBENZ NONE DETECTED 11/04/2018 1955   AMPHETMU NONE DETECTED 11/04/2018 1955   THCU NONE DETECTED 11/04/2018 1955   LABBARB NONE DETECTED 11/04/2018 1955    Alcohol Level     Component Value Date/Time   ETH <10 11/04/2018 1905    IMAGING Ct Angio Head W Or Wo Contrast  Result Date: 11/04/2018 CLINICAL DATA:  Intermittent right eye vision loss EXAM: CT ANGIOGRAPHY HEAD AND NECK TECHNIQUE: Multidetector CT imaging of the head and neck was performed using the standard protocol during bolus administration of intravenous contrast.  Multiplanar CT image reconstructions and MIPs were obtained to evaluate the vascular anatomy. Carotid stenosis measurements (when applicable) are obtained utilizing NASCET criteria, using the distal internal carotid diameter as the denominator. CONTRAST:  53mL OMNIPAQUE IOHEXOL 350 MG/ML SOLN COMPARISON:  CTA head neck 12/31/2017 FINDINGS: CTA NECK FINDINGS SKELETON: There is no bony spinal canal stenosis. No lytic or blastic lesion. OTHER NECK: Normal pharynx, larynx and major salivary glands. No cervical lymphadenopathy. Unremarkable thyroid gland. UPPER CHEST: No pneumothorax or pleural effusion. No nodules or masses. AORTIC ARCH: There is mild calcific atherosclerosis of the aortic arch. There is no aneurysm, dissection or hemodynamically significant stenosis of the visualized portion of the aorta. Conventional 3 vessel aortic branching pattern. The visualized proximal subclavian arteries are widely patent. RIGHT CAROTID SYSTEM: No dissection, occlusion or aneurysm. There is mixed density atherosclerosis extending into the proximal ICA, resulting in approximately 90% stenosis. This stenosis has worsened from the prior study. LEFT CAROTID SYSTEM: No dissection, occlusion or aneurysm. Mild atherosclerotic calcification at the carotid bifurcation without hemodynamically significant stenosis. VERTEBRAL ARTERIES: Left dominant configuration. Both origins are clearly patent. There is no  dissection, occlusion or flow-limiting stenosis to the skull base (V1-V3 segments). CTA HEAD FINDINGS POSTERIOR CIRCULATION: --Vertebral arteries: Normal V4 segments. --Posterior inferior cerebellar arteries (PICA): Patent origins from the vertebral arteries. --Anterior inferior cerebellar arteries (AICA): Patent origins from the basilar artery. --Basilar artery: Normal. --Superior cerebellar arteries: Normal. --Posterior cerebral arteries: Normal. Both originate from the basilar artery. Posterior communicating arteries (p-comm) are  diminutive or absent. ANTERIOR CIRCULATION: --Intracranial internal carotid arteries: Atherosclerotic calcification of the internal carotid arteries at the skull base without hemodynamically significant stenosis. --Anterior cerebral arteries (ACA): Normal. Both A1 segments are present. Patent anterior communicating artery (a-comm). --Middle cerebral arteries (MCA): Normal. VENOUS SINUSES: As permitted by contrast timing, patent. ANATOMIC VARIANTS: None Review of the MIP images confirms the above findings. IMPRESSION: 1. No emergent large vessel occlusion or high-grade stenosis of the intracranial arteries. 2. Increased, now approximately 90% stenosis of the proximal right internal carotid artery secondary to mixed density atherosclerosis. 3. Aortic Atherosclerosis (ICD10-I70.0). Electronically Signed   By: Ulyses Jarred M.D.   On: 11/04/2018 23:43   Ct Head Wo Contrast  Result Date: 11/04/2018 CLINICAL DATA:  Right eye blurred vision. Transient loss of right eye vision 2 days ago. History of atrial fibrillation. EXAM: CT HEAD WITHOUT CONTRAST TECHNIQUE: Contiguous axial images were obtained from the base of the skull through the vertex without intravenous contrast. COMPARISON:  CT head 03/04/2018. FINDINGS: Brain: There is no evidence of acute intracranial hemorrhage, mass lesion, brain edema or extra-axial fluid collection. The ventricles and subarachnoid spaces are appropriately sized for age. Mild chronic small vessel ischemic changes in the periventricular white matter are stable. There is no evidence of acute cortical stroke. Vascular: Intracranial vascular calcifications. No hyperdense vessel identified. Skull: Negative for fracture or focal lesion. Sinuses/Orbits: Minimal ethmoid sinus mucosal thickening and small bilateral mastoid effusions without coalescence. No orbital abnormalities. Other: None. IMPRESSION: Stable head CT without acute findings or explanation for the patient's symptoms. Stable mild  chronic small vessel ischemic changes. Electronically Signed   By: Richardean Sale M.D.   On: 11/04/2018 17:09   Ct Angio Neck W And/or Wo Contrast  Result Date: 11/04/2018 CLINICAL DATA:  Intermittent right eye vision loss EXAM: CT ANGIOGRAPHY HEAD AND NECK TECHNIQUE: Multidetector CT imaging of the head and neck was performed using the standard protocol during bolus administration of intravenous contrast. Multiplanar CT image reconstructions and MIPs were obtained to evaluate the vascular anatomy. Carotid stenosis measurements (when applicable) are obtained utilizing NASCET criteria, using the distal internal carotid diameter as the denominator. CONTRAST:  70mL OMNIPAQUE IOHEXOL 350 MG/ML SOLN COMPARISON:  CTA head neck 12/31/2017 FINDINGS: CTA NECK FINDINGS SKELETON: There is no bony spinal canal stenosis. No lytic or blastic lesion. OTHER NECK: Normal pharynx, larynx and major salivary glands. No cervical lymphadenopathy. Unremarkable thyroid gland. UPPER CHEST: No pneumothorax or pleural effusion. No nodules or masses. AORTIC ARCH: There is mild calcific atherosclerosis of the aortic arch. There is no aneurysm, dissection or hemodynamically significant stenosis of the visualized portion of the aorta. Conventional 3 vessel aortic branching pattern. The visualized proximal subclavian arteries are widely patent. RIGHT CAROTID SYSTEM: No dissection, occlusion or aneurysm. There is mixed density atherosclerosis extending into the proximal ICA, resulting in approximately 90% stenosis. This stenosis has worsened from the prior study. LEFT CAROTID SYSTEM: No dissection, occlusion or aneurysm. Mild atherosclerotic calcification at the carotid bifurcation without hemodynamically significant stenosis. VERTEBRAL ARTERIES: Left dominant configuration. Both origins are clearly patent. There is no dissection, occlusion or flow-limiting  stenosis to the skull base (V1-V3 segments). CTA HEAD FINDINGS POSTERIOR CIRCULATION:  --Vertebral arteries: Normal V4 segments. --Posterior inferior cerebellar arteries (PICA): Patent origins from the vertebral arteries. --Anterior inferior cerebellar arteries (AICA): Patent origins from the basilar artery. --Basilar artery: Normal. --Superior cerebellar arteries: Normal. --Posterior cerebral arteries: Normal. Both originate from the basilar artery. Posterior communicating arteries (p-comm) are diminutive or absent. ANTERIOR CIRCULATION: --Intracranial internal carotid arteries: Atherosclerotic calcification of the internal carotid arteries at the skull base without hemodynamically significant stenosis. --Anterior cerebral arteries (ACA): Normal. Both A1 segments are present. Patent anterior communicating artery (a-comm). --Middle cerebral arteries (MCA): Normal. VENOUS SINUSES: As permitted by contrast timing, patent. ANATOMIC VARIANTS: None Review of the MIP images confirms the above findings. IMPRESSION: 1. No emergent large vessel occlusion or high-grade stenosis of the intracranial arteries. 2. Increased, now approximately 90% stenosis of the proximal right internal carotid artery secondary to mixed density atherosclerosis. 3. Aortic Atherosclerosis (ICD10-I70.0). Electronically Signed   By: Ulyses Jarred M.D.   On: 11/04/2018 23:43   Dg Chest Port 1 View  Result Date: 11/04/2018 CLINICAL DATA:  TIA EXAM: PORTABLE CHEST 1 VIEW COMPARISON:  March 04, 2018 FINDINGS: There is cardiomegaly. Overlying median sternotomy wires. Left-sided pacemaker seen with the tips in the right atrium and right ventricle. Both lungs are clear. No acute osseous abnormality. IMPRESSION: No acute cardiopulmonary process.  Stable cardiomegaly Electronically Signed   By: Prudencio Pair M.D.   On: 11/04/2018 23:21   Vas US Carotid (at [place] [facility] Only)  Result Date: 11/06/2018 Carotid Arterial Duplex Study Indications: TIA. Performing Technologist: C BYNUM  Examination Guidelines: A complete evaluation includes  B-mode imaging, spectral Doppler, color Doppler, and power Doppler as needed of all accessible portions of each vessel. Bilateral testing is considered an integral part of a complete examination. Limited examinations for reoccurring indications may be performed as noted.  Right Carotid Findings: +----------+--------+--------+--------+-------------------------------+--------+           PSV cm/sEDV cm/sStenosisPlaque Description             Comments +----------+--------+--------+--------+-------------------------------+--------+ CCA Prox  50      11                                                      +----------+--------+--------+--------+-------------------------------+--------+ CCA Distal43      13                                                      +----------+--------+--------+--------+-------------------------------+--------+ ICA Prox  496     162     80-99%  focal, heterogenous and                                                   calcific                                +----------+--------+--------+--------+-------------------------------+--------+ ICA Mid   72      13                                                      +----------+--------+--------+--------+-------------------------------+--------+  ICA Distal39      17                                                      +----------+--------+--------+--------+-------------------------------+--------+ ECA       69      14                                                      +----------+--------+--------+--------+-------------------------------+--------+ +----------+--------+-------+----------------+-------------------+           PSV cm/sEDV cmsDescribe        Arm Pressure (mmHG) +----------+--------+-------+----------------+-------------------+ [id number]            Multiphasic, WNL                    +----------+--------+-------+----------------+-------------------+  +---------+--------+--+--------+--+---------+ VertebralPSV cm/s39EDV cm/s14Antegrade +---------+--------+--+--------+--+---------+  Left Carotid Findings: +----------+--------+--------+--------+-------------------------------+--------+           PSV cm/sEDV cm/sStenosisPlaque Description             Comments +----------+--------+--------+--------+-------------------------------+--------+ CCA Prox  90      22                                                      +----------+--------+--------+--------+-------------------------------+--------+ CCA Distal62      16              focal, calcific and hyperechoic         +----------+--------+--------+--------+-------------------------------+--------+ ICA Prox  59      20              calcific                                +----------+--------+--------+--------+-------------------------------+--------+ ICA Mid   67      26                                                      +----------+--------+--------+--------+-------------------------------+--------+ ICA Distal52      19                                                      +----------+--------+--------+--------+-------------------------------+--------+ ECA       72      12              focal, hyperechoic and calcific         +----------+--------+--------+--------+-------------------------------+--------+ +----------+--------+--------+----------------+-------------------+           PSV cm/sEDV cm/sDescribe        Arm Pressure (mmHG) +----------+--------+--------+----------------+-------------------+ [id number]             Multiphasic, WNL                    +----------+--------+--------+----------------+-------------------+ +---------+--------+--+--------+--+---------+  VertebralPSV cm/s42EDV cm/s11Antegrade +---------+--------+--+--------+--+---------+  Summary: Right Carotid: Velocities in the right ICA are consistent with a 80-99%                 stenosis. Left Carotid: There is no evidence of stenosis in the left ICA. Vertebrals:  Bilateral vertebral arteries demonstrate antegrade flow. Subclavians: Normal flow hemodynamics were seen in bilateral subclavian              arteries. *See table(s) above for measurements and observations.  Electronically signed by Harold Barban MD on 11/06/2018 at 6:59:05 AM.    Final     PHYSICAL EXAM   General - Well nourished, well developed, in no apparent distress.  Ophthalmologic - fundi not visualized due to noncooperation.  Cardiovascular - Regular rhythm and rate, no fib on exam.  Mental Status -  Level of arousal and orientation to time, place, and person were intact. Language including expression, naming, repetition, comprehension was assessed and found intact. Attention span and concentration were normal. Recent and remote memory were intact. Fund of Knowledge was assessed and was intact.  Cranial Nerves II - XII - II - Visual field intact OU. III, IV, VI - Extraocular movements intact. V - Facial sensation intact bilaterally. VII - Facial movement intact bilaterally. VIII - Hearing & vestibular intact bilaterally. X - Palate elevates symmetrically. XI - Chin turning & shoulder shrug intact bilaterally. XII - Tongue protrusion intact.  Motor Strength - The patient's strength was normal in all extremities and pronator drift was absent.  Bulk was normal and fasciculations were absent.   Motor Tone - Muscle tone was assessed at the neck and appendages and was normal.  Reflexes - The patient's reflexes were symmetrical in all extremities and he had no pathological reflexes.  Sensory - Light touch, temperature/pinprick were assessed and were symmetrical.    Coordination - The patient had normal movements in the hands and feet with no ataxia or dysmetria.  Tremor was absent.  Gait and Station - deferred.   ASSESSMENT/PLAN [name] is a 85 y.o. male with  history of AF on Eliquis, CAD s/p CABG, DM, endocarditis, migraines, vtach s/p ICD placement presenting with amaurosis fugax of his right eye.   Amaurosis Fugax OD  IP recurrence of amaurosis fugax following admission   CT head No acute abnormality. Small vessel disease.    CTA head & neck no ELVO. Prox R ICA 90% stenosis. Aortic atherosclerosis.   Not able to have MRI d/t AICD   Carotid Doppler  R ICA 80-99% stenosis   2D Echo EF 35-40%. RA and LA dilated. New AF since 03/2018.   LDL 44  HgbA1c 6.8  IV heparin for VTE prophylaxis  Eliquis (apixaban) daily prior to admission, now on aspirin 81 mg daily and heparin IV. (see AF below)  Therapy recommendations:  none  Disposition:  Return home  Carotid stenosis   CTA neck Prox R ICA 90% stenosis.  Carotid Doppler  R ICA 80-99% stenosis   Had recurrence of amaurosis fugax once during carotid doppler testing  VVS consulted  CEA planned 10/8 with Dr. Donzetta Matters     Atrial Fibrillation  Home anticoagulation:  Eliquis (apixaban) daily   Now on IV heparin for preparation of CEA tomorrow . Plan to continue Eliquis (apixaban) daily at discharge   Hypertension  Stable . Permissive hypertension (OK if < 180/105) but gradually normalize in 5-7 days . Goal SBP at least 130-150 given ICA stenosis before CEA .  Avoid hypotension  Hyperlipidemia  Home meds:  lipitor 80, resumed in hospital  LDL 44, goal < 70  Continue statin at discharge  Diabetes type II Controlled  HgbA1c 6.8, goal < 7.0  CBGs  SSI  PCP follow up  Other Stroke Risk Factors  Advanced age  Former Cigarette smoker, quit 5 yts ago  ETOH use, advised to drink no more than 2 drink(s) a day  Family hx stroke (mother)  Coronary artery disease  Migraines 6-7 x mo, worsened on xarelto and warfarin in past  Hx Congestive heart failure w/ AICD  Other Active Problems  Hx VTach s/p AICD  Asthma/COPD   Hospital day # 1  Neurology will sign  off. Please call with questions. Pt will follow up with stroke clinic NP at [facility] in about 4 weeks. Thanks for the consult.  Rosalin Hawking, MD PhD Stroke Neurology 11/06/2018 5:39 PM  To contact Stroke Continuity provider, please refer to [email or web address] After hours, contact General Neurology

## 2018-11-07 ENCOUNTER — Encounter (HOSPITAL_COMMUNITY): Payer: Self-pay | Admitting: Certified Registered"

## 2018-11-07 ENCOUNTER — Encounter (HOSPITAL_COMMUNITY)
Admission: EM | Disposition: A | Discharge status: Home / Self Care | Payer: Self-pay | Source: Ambulatory Visit | Attending: Internal Medicine

## 2018-11-07 ENCOUNTER — Inpatient Hospital Stay (HOSPITAL_COMMUNITY): Payer: Medicare Other | Admitting: Certified Registered Nurse Anesthetist

## 2018-11-07 DIAGNOSIS — I6521 Occlusion and stenosis of right carotid artery: Secondary | ICD-10-CM

## 2018-11-07 HISTORY — PX: ENDARTERECTOMY: SHX5162

## 2018-11-07 LAB — MRSA PCR SCREENING: MRSA by PCR: NEGATIVE

## 2018-11-07 LAB — APTT: aPTT: 37 seconds — ABNORMAL HIGH (ref 24–36)

## 2018-11-07 LAB — CBC
HCT: 35.4 % — ABNORMAL LOW (ref 39.0–52.0)
Hemoglobin: 12.4 g/dL — ABNORMAL LOW (ref 13.0–17.0)
MCH: 35.3 pg — ABNORMAL HIGH (ref 26.0–34.0)
MCHC: 35 g/dL (ref 30.0–36.0)
MCV: 100.9 fL — ABNORMAL HIGH (ref 80.0–100.0)
Platelets: 114 10*3/uL — ABNORMAL LOW (ref 150–400)
RBC: 3.51 MIL/uL — ABNORMAL LOW (ref 4.22–5.81)
RDW: 13.1 % (ref 11.5–15.5)
WBC: 4.5 10*3/uL (ref 4.0–10.5)
nRBC: 0 % (ref 0.0–0.2)

## 2018-11-07 LAB — GLUCOSE, CAPILLARY
Glucose-Capillary: 109 mg/dL — ABNORMAL HIGH (ref 70–99)
Glucose-Capillary: 114 mg/dL — ABNORMAL HIGH (ref 70–99)
Glucose-Capillary: 158 mg/dL — ABNORMAL HIGH (ref 70–99)
Glucose-Capillary: 152 mg/dL — ABNORMAL HIGH (ref 70–99)
Glucose-Capillary: 215 mg/dL — ABNORMAL HIGH (ref 70–99)

## 2018-11-07 LAB — HEPARIN LEVEL (UNFRACTIONATED): Heparin Unfractionated: 0.98 IU/mL — ABNORMAL HIGH (ref 0.30–0.70)

## 2018-11-07 SURGERY — ENDARTERECTOMY, CAROTID
Anesthesia: General | Site: Neck | Laterality: Right

## 2018-11-07 MED ORDER — CEFAZOLIN SODIUM-DEXTROSE 2-3 GM-%(50ML) IV SOLR
INTRAVENOUS | Status: DC | PRN
  Administered 2018-11-07: 2 g via INTRAVENOUS

## 2018-11-07 MED ORDER — PROTAMINE SULFATE 10 MG/ML IV SOLN
INTRAVENOUS | Status: DC | PRN
  Administered 2018-11-07: 50 mg via INTRAVENOUS

## 2018-11-07 MED ORDER — CEFAZOLIN SODIUM 1 G IJ SOLR
INTRAMUSCULAR | Status: AC
  Filled 2018-11-07: qty 20

## 2018-11-07 MED ORDER — LIDOCAINE 2% (20 MG/ML) 5 ML SYRINGE
INTRAMUSCULAR | Status: DC | PRN
  Administered 2018-11-07: 100 mg via INTRAVENOUS

## 2018-11-07 MED ORDER — HYDRALAZINE HCL 20 MG/ML IJ SOLN: 5.0000 mg | INTRAMUSCULAR | Status: DC | PRN

## 2018-11-07 MED ORDER — MAGNESIUM SULFATE 2 GM/50ML IV SOLN: 2.0000 g | Freq: Every day | INTRAVENOUS | Status: DC | PRN

## 2018-11-07 MED ORDER — ONDANSETRON HCL 4 MG/2ML IJ SOLN
INTRAMUSCULAR | Status: AC
  Filled 2018-11-07: qty 2

## 2018-11-07 MED ORDER — DEXAMETHASONE SODIUM PHOSPHATE 10 MG/ML IJ SOLN
INTRAMUSCULAR | Status: DC | PRN
  Administered 2018-11-07: 5 mg via INTRAVENOUS

## 2018-11-07 MED ORDER — SODIUM CHLORIDE 0.9 % IV SOLN
INTRAVENOUS | Status: DC
  Administered 2018-11-07: 16:00:00 via INTRAVENOUS

## 2018-11-07 MED ORDER — HEMOSTATIC AGENTS (NO CHARGE) OPTIME
TOPICAL | Status: DC | PRN
  Administered 2018-11-07: 1 via TOPICAL

## 2018-11-07 MED ORDER — PHENOL 1.4 % MT LIQD: 1.0000 | OROMUCOSAL | Status: DC | PRN

## 2018-11-07 MED ORDER — LIDOCAINE 2% (20 MG/ML) 5 ML SYRINGE
INTRAMUSCULAR | Status: AC
  Filled 2018-11-07: qty 5

## 2018-11-07 MED ORDER — MEPERIDINE HCL 25 MG/ML IJ SOLN: 6.2500 mg | INTRAMUSCULAR | Status: DC | PRN

## 2018-11-07 MED ORDER — SODIUM CHLORIDE 0.9 % IV SOLN
0.0125 ug/kg/min | INTRAVENOUS | Status: DC
  Administered 2018-11-07: .05 ug/kg/min via INTRAVENOUS
  Filled 2018-11-07: qty 2000

## 2018-11-07 MED ORDER — OXYCODONE-ACETAMINOPHEN 5-325 MG PO TABS: 1.0000 | ORAL_TABLET | ORAL | Status: DC | PRN

## 2018-11-07 MED ORDER — PROPOFOL 10 MG/ML IV BOLUS
INTRAVENOUS | Status: DC | PRN
  Administered 2018-11-07: 80 mg via INTRAVENOUS

## 2018-11-07 MED ORDER — BISACODYL 10 MG RE SUPP: 10.0000 mg | Freq: Every day | RECTAL | Status: DC | PRN

## 2018-11-07 MED ORDER — MORPHINE SULFATE (PF) 2 MG/ML IV SOLN: 2.0000 mg | INTRAVENOUS | Status: DC | PRN

## 2018-11-07 MED ORDER — ROCURONIUM BROMIDE 10 MG/ML (PF) SYRINGE
PREFILLED_SYRINGE | INTRAVENOUS | Status: DC | PRN
  Administered 2018-11-07: 100 mg via INTRAVENOUS

## 2018-11-07 MED ORDER — PHENYLEPHRINE 40 MCG/ML (10ML) SYRINGE FOR IV PUSH (FOR BLOOD PRESSURE SUPPORT)
PREFILLED_SYRINGE | INTRAVENOUS | Status: DC | PRN
  Administered 2018-11-07: 40 ug via INTRAVENOUS
  Administered 2018-11-07: 80 ug via INTRAVENOUS

## 2018-11-07 MED ORDER — LACTATED RINGERS IV SOLN
INTRAVENOUS | Status: DC | PRN
  Administered 2018-11-07: 08:00:00 via INTRAVENOUS

## 2018-11-07 MED ORDER — POLYETHYLENE GLYCOL 3350 17 G PO PACK: 17.0000 g | PACK | Freq: Every day | ORAL | Status: DC | PRN

## 2018-11-07 MED ORDER — PHENYLEPHRINE 40 MCG/ML (10ML) SYRINGE FOR IV PUSH (FOR BLOOD PRESSURE SUPPORT)
PREFILLED_SYRINGE | INTRAVENOUS | Status: AC
  Filled 2018-11-07: qty 10

## 2018-11-07 MED ORDER — PROPOFOL 10 MG/ML IV BOLUS
INTRAVENOUS | Status: AC
  Filled 2018-11-07: qty 20

## 2018-11-07 MED ORDER — 0.9 % SODIUM CHLORIDE (POUR BTL) OPTIME
TOPICAL | Status: DC | PRN
  Administered 2018-11-07: 2000 mL

## 2018-11-07 MED ORDER — HEPARIN (PORCINE) 25000 UT/250ML-% IV SOLN
1200.0000 [IU]/h | INTRAVENOUS | Status: DC
  Administered 2018-11-07: 1200 [IU]/h via INTRAVENOUS
  Filled 2018-11-07: qty 250

## 2018-11-07 MED ORDER — DEXAMETHASONE SODIUM PHOSPHATE 10 MG/ML IJ SOLN
INTRAMUSCULAR | Status: AC
  Filled 2018-11-07: qty 1

## 2018-11-07 MED ORDER — ALUM & MAG HYDROXIDE-SIMETH 200-200-20 MG/5ML PO SUSP: 15.0000 mL | ORAL | Status: DC | PRN

## 2018-11-07 MED ORDER — SUGAMMADEX SODIUM 200 MG/2ML IV SOLN
INTRAVENOUS | Status: DC | PRN
  Administered 2018-11-07: 200 mg via INTRAVENOUS

## 2018-11-07 MED ORDER — HYDROMORPHONE HCL 1 MG/ML IJ SOLN
INTRAMUSCULAR | Status: AC
  Filled 2018-11-07: qty 1

## 2018-11-07 MED ORDER — HYDROMORPHONE HCL 1 MG/ML IJ SOLN
0.2500 mg | INTRAMUSCULAR | Status: DC | PRN
  Administered 2018-11-07 (×2): 0.5 mg via INTRAVENOUS

## 2018-11-07 MED ORDER — DOCUSATE SODIUM 100 MG PO CAPS
100.0000 mg | ORAL_CAPSULE | Freq: Every day | ORAL | Status: DC
  Administered 2018-11-08: 100 mg via ORAL
  Filled 2018-11-07: qty 1

## 2018-11-07 MED ORDER — PANTOPRAZOLE SODIUM 40 MG PO TBEC
40.0000 mg | DELAYED_RELEASE_TABLET | Freq: Every day | ORAL | Status: DC
  Administered 2018-11-07 – 2018-11-08 (×2): 40 mg via ORAL
  Filled 2018-11-07 (×2): qty 1

## 2018-11-07 MED ORDER — POTASSIUM CHLORIDE CRYS ER 20 MEQ PO TBCR: 20.0000 meq | EXTENDED_RELEASE_TABLET | Freq: Every day | ORAL | Status: DC | PRN

## 2018-11-07 MED ORDER — FENTANYL CITRATE (PF) 250 MCG/5ML IJ SOLN
INTRAMUSCULAR | Status: DC | PRN
  Administered 2018-11-07: 150 ug via INTRAVENOUS

## 2018-11-07 MED ORDER — SODIUM CHLORIDE 0.9 % IV SOLN
INTRAVENOUS | Status: DC | PRN
  Administered 2018-11-07: 15 ug/min via INTRAVENOUS

## 2018-11-07 MED ORDER — SODIUM CHLORIDE 0.9 % IV SOLN
INTRAVENOUS | Status: AC
  Filled 2018-11-07: qty 1.2

## 2018-11-07 MED ORDER — PROTAMINE SULFATE 10 MG/ML IV SOLN
INTRAVENOUS | Status: AC
  Filled 2018-11-07: qty 5

## 2018-11-07 MED ORDER — CEFAZOLIN SODIUM-DEXTROSE 2-4 GM/100ML-% IV SOLN
2.0000 g | Freq: Three times a day (TID) | INTRAVENOUS | Status: AC
  Administered 2018-11-07 – 2018-11-08 (×2): 2 g via INTRAVENOUS
  Filled 2018-11-07 (×2): qty 100

## 2018-11-07 MED ORDER — HEPARIN SODIUM (PORCINE) 1000 UNIT/ML IJ SOLN
INTRAMUSCULAR | Status: DC | PRN
  Administered 2018-11-07: 10000 [IU] via INTRAVENOUS

## 2018-11-07 MED ORDER — ROCURONIUM BROMIDE 10 MG/ML (PF) SYRINGE
PREFILLED_SYRINGE | INTRAVENOUS | Status: AC
  Filled 2018-11-07: qty 10

## 2018-11-07 MED ORDER — ONDANSETRON HCL 4 MG/2ML IJ SOLN
INTRAMUSCULAR | Status: DC | PRN
  Administered 2018-11-07: 4 mg via INTRAVENOUS

## 2018-11-07 MED ORDER — LIDOCAINE HCL (PF) 1 % IJ SOLN
INTRAMUSCULAR | Status: AC
  Filled 2018-11-07: qty 30

## 2018-11-07 MED ORDER — GUAIFENESIN-DM 100-10 MG/5ML PO SYRP: 15.0000 mL | ORAL_SOLUTION | ORAL | Status: DC | PRN

## 2018-11-07 MED ORDER — FENTANYL CITRATE (PF) 250 MCG/5ML IJ SOLN
INTRAMUSCULAR | Status: AC
  Filled 2018-11-07: qty 5

## 2018-11-07 MED ORDER — LABETALOL HCL 5 MG/ML IV SOLN
INTRAVENOUS | Status: DC | PRN
  Administered 2018-11-07: 10 mg via INTRAVENOUS

## 2018-11-07 MED ORDER — SODIUM CHLORIDE 0.9 % IV SOLN: 500.0000 mL | Freq: Once | INTRAVENOUS | Status: DC | PRN

## 2018-11-07 MED ORDER — ONDANSETRON HCL 4 MG/2ML IJ SOLN: 4.0000 mg | Freq: Once | INTRAMUSCULAR | Status: DC | PRN

## 2018-11-07 MED ORDER — ACETAMINOPHEN 325 MG PO TABS
ORAL_TABLET | ORAL | Status: AC
  Filled 2018-11-07: qty 2

## 2018-11-07 MED ORDER — ONDANSETRON HCL 4 MG/2ML IJ SOLN: 4.0000 mg | Freq: Four times a day (QID) | INTRAMUSCULAR | Status: DC | PRN

## 2018-11-07 MED ORDER — SODIUM CHLORIDE 0.9 % IV SOLN
INTRAVENOUS | Status: DC | PRN
  Administered 2018-11-07: 500 mL

## 2018-11-07 SURGICAL SUPPLY — 56 items
ADH SKN CLS APL DERMABOND .7 (GAUZE/BANDAGES/DRESSINGS) ×1
ADPR TBG 2 MALE LL ART (MISCELLANEOUS)
CANISTER SUCT 3000ML PPV (MISCELLANEOUS) ×2 IMPLANT
CATH ROBINSON RED A/P 18FR (CATHETERS) ×1 IMPLANT
CATH SUCT ARGYLE CHIMNEY 10FR (CATHETERS) IMPLANT
CLIP VESOCCLUDE MED 24/CT (CLIP) ×2 IMPLANT
CLIP VESOCCLUDE SM WIDE 24/CT (CLIP) ×2 IMPLANT
COVER PROBE W GEL 5X96 (DRAPES) ×1 IMPLANT
COVER WAND RF STERILE (DRAPES) ×1 IMPLANT
DERMABOND ADVANCED (GAUZE/BANDAGES/DRESSINGS) ×1
DERMABOND ADVANCED .7 DNX12 (GAUZE/BANDAGES/DRESSINGS) ×1 IMPLANT
DRAIN CHANNEL 15F RND FF W/TCR (WOUND CARE) IMPLANT
ELECT REM PT RETURN 9FT ADLT (ELECTROSURGICAL) ×2
ELECTRODE REM PT RTRN 9FT ADLT (ELECTROSURGICAL) ×1 IMPLANT
EVACUATOR SILICONE 100CC (DRAIN) IMPLANT
GLOVE BIO SURGEON STRL SZ7.5 (GLOVE) ×1 IMPLANT
GLOVE BIOGEL PI IND STRL 8 (GLOVE) IMPLANT
GLOVE BIOGEL PI INDICATOR 8 (GLOVE) ×3
GLOVE INDICATOR 6.5 STRL GRN (GLOVE) ×2 IMPLANT
GLOVE SURG SS PI 6.5 STRL IVOR (GLOVE) ×1 IMPLANT
GLOVE SURG SS PI 7.5 STRL IVOR (GLOVE) ×1 IMPLANT
GOWN STRL REUS W/ TWL LRG LVL3 (GOWN DISPOSABLE) ×2 IMPLANT
GOWN STRL REUS W/ TWL XL LVL3 (GOWN DISPOSABLE) ×1 IMPLANT
GOWN STRL REUS W/TWL 2XL LVL3 (GOWN DISPOSABLE) ×1 IMPLANT
GOWN STRL REUS W/TWL LRG LVL3 (GOWN DISPOSABLE) ×4
GOWN STRL REUS W/TWL XL LVL3 (GOWN DISPOSABLE) ×2
HEMOSTAT SNOW SURGICEL 2X4 (HEMOSTASIS) ×1 IMPLANT
INSERT FOGARTY SM (MISCELLANEOUS) ×1 IMPLANT
IV ADAPTER SYR DOUBLE MALE LL (MISCELLANEOUS) IMPLANT
KIT BASIN OR (CUSTOM PROCEDURE TRAY) ×2 IMPLANT
KIT SHUNT ARGYLE CAROTID ART 6 (VASCULAR PRODUCTS) ×1 IMPLANT
KIT SUCTION CATH 14FR (SUCTIONS) ×1 IMPLANT
KIT TURNOVER KIT B (KITS) ×2 IMPLANT
NDL HYPO 25GX1X1/2 BEV (NEEDLE) IMPLANT
NDL SPNL 20GX3.5 QUINCKE YW (NEEDLE) IMPLANT
NEEDLE HYPO 25GX1X1/2 BEV (NEEDLE) IMPLANT
NEEDLE SPNL 20GX3.5 QUINCKE YW (NEEDLE) IMPLANT
NS IRRIG 1000ML POUR BTL (IV SOLUTION) ×6 IMPLANT
PACK CAROTID (CUSTOM PROCEDURE TRAY) ×2 IMPLANT
PAD ARMBOARD 7.5X6 YLW CONV (MISCELLANEOUS) ×4 IMPLANT
PATCH HEMASHIELD 8X75 (Vascular Products) ×1 IMPLANT
POSITIONER HEAD DONUT 9IN (MISCELLANEOUS) ×2 IMPLANT
SHUNT CAROTID BYPASS 10 (VASCULAR PRODUCTS) ×1 IMPLANT
SHUNT CAROTID BYPASS 12FRX15.5 (VASCULAR PRODUCTS) IMPLANT
STOPCOCK 4 WAY LG BORE MALE ST (IV SETS) IMPLANT
SUT ETHILON 3 0 PS 1 (SUTURE) IMPLANT
SUT MNCRL AB 4-0 PS2 18 (SUTURE) ×2 IMPLANT
SUT PROLENE 6 0 BV (SUTURE) ×3 IMPLANT
SUT SILK 3 0 (SUTURE)
SUT SILK 3-0 18XBRD TIE 12 (SUTURE) IMPLANT
SUT VIC AB 3-0 SH 27 (SUTURE) ×2
SUT VIC AB 3-0 SH 27X BRD (SUTURE) ×1 IMPLANT
SYR CONTROL 10ML LL (SYRINGE) IMPLANT
TOWEL GREEN STERILE (TOWEL DISPOSABLE) ×2 IMPLANT
TUBING ART PRESS 48 MALE/FEM (TUBING) IMPLANT
WATER STERILE IRR 1000ML POUR (IV SOLUTION) ×2 IMPLANT

## 2018-11-07 NOTE — Anesthesia Postprocedure Evaluation (Signed)
Anesthesia Post Note  Patient: [name]  Procedure(s) Performed: ENDARTERECTOMY CAROTID RIGHT (Right Neck)     Patient location during evaluation: PACU Anesthesia Type: General Level of consciousness: awake and alert Pain management: pain level controlled Vital Signs Assessment: post-procedure vital signs reviewed and stable Respiratory status: spontaneous breathing, nonlabored ventilation, respiratory function stable and patient connected to nasal cannula oxygen Cardiovascular status: blood pressure returned to baseline and stable Postop Assessment: no apparent nausea or vomiting Anesthetic complications: no    Last Vitals:  Vitals:   11/07/18 1335 11/07/18 1405  BP: 104/63 120/68  Pulse: 60 69  Resp: 12 16  Temp:    SpO2: 98% 100%    Last Pain:  Vitals:   11/07/18 1358  TempSrc:   PainSc: 3                  Aurel Nguyen DAVID

## 2018-11-07 NOTE — Progress Notes (Signed)
  Progress Note    11/07/2018 7:23 AM Day of Surgery  Subjective:  No further right eye issues  Vitals:   11/07/18 0035 11/07/18 0500  BP: 122/77 123/78  Pulse: 62 63  Resp: 20 17  Temp: 98.1 F (36.7 C) 97.8 F (36.6 C)  SpO2: 95% 96%    Physical Exam: [id number] Non labored respirations Moving all extremities without limitation Tongue is midline   CBC    Component Value Date/Time   WBC 4.5 11/07/2018 0317   RBC 3.51 (L) 11/07/2018 0317   HGB 12.4 (L) 11/07/2018 0317   HGB 13.4 10/23/2018 1318   HCT 35.4 (L) 11/07/2018 0317   HCT 39.3 10/23/2018 1318   PLT 114 (L) 11/07/2018 0317   PLT 130 (L) 10/23/2018 1318   MCV 100.9 (H) 11/07/2018 0317   MCV 100 (H) 10/23/2018 1318   MCH 35.3 (H) 11/07/2018 0317   MCHC 35.0 11/07/2018 0317   RDW 13.1 11/07/2018 0317   RDW 12.4 10/23/2018 1318   LYMPHSABS 0.6 (L) 11/04/2018 1635   LYMPHSABS 0.9 12/27/2016 1523   MONOABS 0.7 11/04/2018 1635   EOSABS 0.3 11/04/2018 1635   EOSABS 0.2 12/27/2016 1523   BASOSABS 0.0 11/04/2018 1635   BASOSABS 0.0 12/27/2016 1523    BMET    Component Value Date/Time   NA 133 (L) 11/04/2018 1711   NA 135 10/23/2018 1318   K 4.5 11/04/2018 1711   CL 98 11/04/2018 1711   CO2 23 11/04/2018 1635   GLUCOSE 128 (H) 11/04/2018 1711   BUN 14 11/04/2018 1711   BUN 16 10/23/2018 1318   CREATININE 0.90 11/04/2018 1711   CREATININE 1.08 01/04/2016 1630   CALCIUM 9.2 11/04/2018 1635   GFRNONAA >60 11/04/2018 1635   GFRAA >60 11/04/2018 1635    INR    Component Value Date/Time   INR 1.4 (H) 11/04/2018 1635     Intake/Output Summary (Last 24 hours) at 11/07/2018 0723 Last data filed at 11/07/2018 0400 Gross per 24 hour  Intake 1456.1 ml  Output 1 ml  Net 1455.1 ml     Assessment/plan:  85 y.o. male is here with R sided amaurosis with high grade right carotid stenosis. I have discussed proceeding with carotid endarterectomy to decrease his risk of stroke.  I discussed the risk of stroke,  cardiac issues, cranial nerve injury.  He demonstrates good understanding.  We will proceed with right-sided carotid endarterectomy today in the operating room.  I have reviewed all relevant imaging.  Patient will return on Eliquis when safe from surgical standpoint.   Surena Welge C. Donzetta Matters, MD [facility] Office: [phone] Pager: [phone]  11/07/2018 7:23 AM

## 2018-11-07 NOTE — Progress Notes (Signed)
  Day of Surgery Note    Subjective:  No complaints in pacu   Vitals:   11/07/18 1035 11/07/18 1105  BP: 121/68 118/65  Pulse: 66 70  Resp: 13 10  Temp: 97.8 F (36.6 C)   SpO2: 99% 94%    Incisions:   Clean and dry Extremities:  Moving all extremities equally Lungs:  Non labored neuro:  In tact; no further episodes of amaurosis since surgery   Assessment/Plan:  This is a 85 y.o. male who is s/p  Right CEA  -doing well in recovery and neuro in tact -transfer to [facility] when bed available -will restart eliquis tomorrow if no bleeding issues    Leontine Locket, PA-C 11/07/2018 11:31 AM [phone]

## 2018-11-07 NOTE — Anesthesia Preprocedure Evaluation (Signed)
Anesthesia Evaluation  Patient identified by MRN, date of birth, ID band Patient awake    Reviewed: Allergy & Precautions, NPO status , Patient's Chart, lab work & pertinent test results  Airway Mallampati: II  TM Distance: >3 FB Neck ROM: Full    Dental   Pulmonary COPD, former smoker,    Pulmonary exam normal        Cardiovascular + angina + CAD and + CABG  Normal cardiovascular exam+ dysrhythmias Atrial Fibrillation + Cardiac Defibrillator      Neuro/Psych TIA   GI/Hepatic   Endo/Other  diabetes, Type 2, Oral Hypoglycemic Agents  Renal/GU      Musculoskeletal   Abdominal   Peds  Hematology   Anesthesia Other Findings   Reproductive/Obstetrics                             Anesthesia Physical Anesthesia Plan  ASA: III  Anesthesia Plan: General   Post-op Pain Management:    Induction:   PONV Risk Score and Plan: 2 and Ondansetron, Midazolam and Treatment may vary due to age or medical condition  Airway Management Planned: Oral ETT  Additional Equipment: Arterial line  Intra-op Plan:   Post-operative Plan: Extubation in OR  Informed Consent: I have reviewed the patients History and Physical, chart, labs and discussed the procedure including the risks, benefits and alternatives for the proposed anesthesia with the patient or authorized representative who has indicated his/her understanding and acceptance.       Plan Discussed with: CRNA and Surgeon  Anesthesia Plan Comments:         Anesthesia Quick Evaluation

## 2018-11-07 NOTE — Progress Notes (Signed)
Patient arrived to unit and oriented to room. CHG wipes applied. Neuro intact. Right neck incision clean, dry and intact. VSS. Call bell within reach. Will continue to monitor.  Arletta Bale, RN

## 2018-11-07 NOTE — Progress Notes (Signed)
Patient transported to OR.

## 2018-11-07 NOTE — Anesthesia Procedure Notes (Addendum)
Arterial Line Insertion Start/End10/08/2018 6:50 AM, 11/07/2018 7:10 AM Performed by: Gaylene Brooks, CRNA, CRNA  Preanesthetic checklist: patient identified, IV checked, site marked, risks and benefits discussed, surgical consent, monitors and equipment checked, pre-op evaluation, timeout performed and anesthesia consent Lidocaine 1% used for infiltration Left, radial was placed Catheter size: 20 G Hand hygiene performed , maximum sterile barriers used  and Seldinger technique used Allen's test indicative of satisfactory collateral circulation Attempts: 1 Procedure performed without using ultrasound guided technique. Following insertion, dressing applied and Biopatch. Post procedure assessment: normal  Patient tolerated the procedure well with no immediate complications.

## 2018-11-07 NOTE — Progress Notes (Signed)
ANTICOAGULATION CONSULT NOTE - Follow Up Consult  Pharmacy Consult for Heparin (while off Eliquis for surgery) Indication: atrial fibrillation  Allergies  Allergen Reactions  . Latex Other (See Comments)    REDNESS AT REACTION SITE.  Marland Kitchen Metformin Diarrhea  . Rivaroxaban Other (See Comments)    Headaches, blurred vision  . Other Other (See Comments)    BEE STINGS   . Sotalol Other (See Comments)    "BLUE TOES"- cut his circulation off  . Warfarin Sodium Other (See Comments)    REACTION: migraine headaches/vision impariment    Patient Measurements: Height: 6\' 2"  (188 cm) Weight: 206 lb (93.4 kg) IBW/kg (Calculated) : 82.2 Heparin Dosing Weight: 93.4 kg  Vital Signs: Temp: 97.8 F (36.6 C) (10/08 0500) Temp Source: Oral (10/08 0500) BP: 123/78 (10/08 0500) Pulse Rate: 63 (10/08 0500)  Labs: Recent Labs    11/04/18 1635 11/04/18 1711 11/04/18 2330 11/05/18 0046 11/06/18 0906 11/06/18 1951 11/07/18 0317  HGB 14.1 14.6  --   --   --   --  12.4*  HCT 41.2 43.0  --   --   --   --  35.4*  PLT 124*  --   --   --   --   --  114*  APTT 35  --   --   --  35 160* 37*  LABPROT 16.7*  --   --   --   --   --   --   INR 1.4*  --   --   --   --   --   --   HEPARINUNFRC  --   --   --   --  1.98*  --  0.98*  CREATININE 1.00 0.90  --   --   --   --   --   TROPONINIHS  --   --  13 14  --   --   --     Estimated Creatinine Clearance: 69.8 mL/min (by C-G formula based on SCr of 0.9 mg/dL).  Assessment:  85 yr old male on Eliquis 5 mg BID prior to admission for atrial fibrillation.  Now with Amaurosis fugax of right eye. Transitioning to IV heparin today for R CEA on 10/8.  Last Eliquis dose ~10pm on 10/6.  aptt now low at 37 Hep to be stopped on call to OR this am  Goal of Therapy:  Heparin level 0.3-0.7 units/ml aPTT 66-102 seconds Monitor platelets by anticoagulation protocol: Yes   Plan:  Increase heparin to 1200 units/hr F/U anticoag plans post op  Levester Fresh,  PharmD, BCPS, BCCCP Clinical Pharmacist [phone]  Please check AMION for all [place] numbers  11/07/2018 5:59 AM

## 2018-11-07 NOTE — Transfer of Care (Signed)
Immediate Anesthesia Transfer of Care Note  Patient: [name]  Procedure(s) Performed: ENDARTERECTOMY CAROTID RIGHT (Right Neck)  Patient Location: PACU  Anesthesia Type:General  Level of Consciousness: awake, alert  and oriented  Airway & Oxygen Therapy: Patient Spontanous Breathing and Patient connected to nasal cannula oxygen  Post-op Assessment: Report given to RN, Post -op Vital signs reviewed and stable, Patient moving all extremities X 4 and Patient able to stick tongue midline  Post vital signs: Reviewed and stable  Last Vitals:  Vitals Value Taken Time  BP 124/79 11/07/18 0934  Temp    Pulse 66 11/07/18 0935  Resp 16 11/07/18 0935  SpO2 99 % 11/07/18 0935  Vitals shown include unvalidated device data.  Last Pain:  Vitals:   11/07/18 0500  TempSrc: Oral  PainSc:          Complications: No apparent anesthesia complications

## 2018-11-07 NOTE — Progress Notes (Signed)
ANTICOAGULATION CONSULT NOTE - Follow Up Consult  Pharmacy Consult for Heparin (while off Eliquis for surgery) Indication: atrial fibrillation  Allergies  Allergen Reactions  . Latex Other (See Comments)    REDNESS AT REACTION SITE.  Marland Kitchen Metformin Diarrhea  . Rivaroxaban Other (See Comments)    Headaches, blurred vision  . Other Other (See Comments)    BEE STINGS   . Sotalol Other (See Comments)    "BLUE TOES"- cut his circulation off  . Warfarin Sodium Other (See Comments)    REACTION: migraine headaches/vision impariment    Patient Measurements: Height: 6\' 2"  (188 cm) Weight: 206 lb (93.4 kg) IBW/kg (Calculated) : 82.2 Heparin Dosing Weight: 93.4 kg  Vital Signs: Temp: 97.8 F (36.6 C) (10/08 1035) Temp Source: Oral (10/08 0500) BP: 116/70 (10/08 1505) Pulse Rate: 72 (10/08 1505)  Labs: Recent Labs    11/04/18 1635 11/04/18 1711 11/04/18 2330 11/05/18 0046 11/06/18 0906 11/06/18 1951 11/07/18 0317  HGB 14.1 14.6  --   --   --   --  12.4*  HCT 41.2 43.0  --   --   --   --  35.4*  PLT 124*  --   --   --   --   --  114*  APTT 35  --   --   --  35 160* 37*  LABPROT 16.7*  --   --   --   --   --   --   INR 1.4*  --   --   --   --   --   --   HEPARINUNFRC  --   --   --   --  1.98*  --  0.98*  CREATININE 1.00 0.90  --   --   --   --   --   TROPONINIHS  --   --  13 14  --   --   --     Estimated Creatinine Clearance: 69.8 mL/min (by C-G formula based on SCr of 0.9 mg/dL).  Assessment:  85 yr old male on Eliquis 5 mg BID prior to admission for atrial fibrillation.  Now with Amaurosis fugax of right eye. Transitioned to IV heparin on 10/7 for R CEA on 10/8.  Last Eliquis dose ~10pm on 10/6.  Using aPTTs for heparin monitoring since heparin levels are falsely elevated due to recent Eliquis doses.  Low baseline platelet count; seems to run 90s-150s.     Initial aPTT was supratherapeutic (160 seconds) on 1300 units/hr.  Rate reduced to 1100 units/hr > next aPTT  subtherapeutic (37 seconds) > drip increased to 1200 units/hr ~6am, then off on call to OR this am.   Heparin to resume at 5pm today, ~ 8 hrs post-op.   Goal of Therapy:  Heparin level 0.3-0.7 units/ml aPTT 66-102 seconds Monitor platelets by anticoagulation protocol: Yes   Plan:   Heparin drip to resume at 5pm at 1200 units/hr.  aPTT and heparin level ~8 hrs after drip resumes.  Daily aPTT, heparin level and CBC while on heparin.  Noted plan to resume Eliquis on 10/9 if no bleeding issues.  [name], [place] Pager: [phone] or phone: [phone] 11/07/2018,3:48 PM

## 2018-11-07 NOTE — Plan of Care (Signed)
  Problem: Education: Goal: Knowledge of General Education information will improve Description: Including pain rating scale, medication(s)/side effects and non-pharmacologic comfort measures Outcome: Progressing   Problem: Activity: Goal: Risk for activity intolerance will decrease Outcome: Progressing   Problem: Pain Managment: Goal: General experience of comfort will improve Outcome: Progressing   Problem: Safety: Goal: Ability to remain free from injury will improve Outcome: Progressing   Problem: Skin Integrity: Goal: Risk for impaired skin integrity will decrease Outcome: Progressing   Problem: Education: Goal: Knowledge of secondary prevention will improve Outcome: Progressing   Problem: Education: Goal: Knowledge of disease or condition will improve 11/07/2018 2319 by Drenda Freeze, RN Outcome: Progressing 11/07/2018 2318 by Drenda Freeze, RN Outcome: Progressing

## 2018-11-07 NOTE — Anesthesia Procedure Notes (Signed)
Procedure Name: Intubation Date/Time: 11/07/2018 7:43 AM Performed by: Gaylene Brooks, CRNA Pre-anesthesia Checklist: Patient identified, Emergency Drugs available, Suction available and Patient being monitored Patient Re-evaluated:Patient Re-evaluated prior to induction Oxygen Delivery Method: Circle System Utilized Preoxygenation: Pre-oxygenation with 100% oxygen Induction Type: IV induction Ventilation: Two handed mask ventilation required and Oral airway inserted - appropriate to patient size Laryngoscope Size: 3 and Miller Grade View: Grade I Tube type: Oral Tube size: 7.5 mm Number of attempts: 1 Airway Equipment and Method: Stylet and Oral airway Placement Confirmation: ETT inserted through vocal cords under direct vision,  positive ETCO2 and breath sounds checked- equal and bilateral Secured at: 22 cm Tube secured with: Tape Dental Injury: Teeth and Oropharynx as per pre-operative assessment

## 2018-11-07 NOTE — Op Note (Signed)
Patient name: [name] MRN: [id number] DOB: 22-May-1931 Sex: male  11/07/2018 Pre-operative Diagnosis: Symptomatic right ICA stenosis Post-operative diagnosis:  Same Surgeon:  Apolinar Junes C. Randie Heinz, MD Assistant: Doreatha Massed, PA Procedure Performed: Right carotid endarterectomy with Dacron patch angioplasty  Indications: 85 year old male with high-grade carotid stenosis and 2 episodes of amaurosis within the past week.  He is now indicated for carotid endarterectomy.  Findings: There was a ruptured plaque right at the ICA bifurcation.  After endarterectomy there was good signal in the distal ICA.  Prior to this there was strong backbleeding shunt was placed into the notable tortuosity on CT angio.   Procedure:  The patient was identified in the holding area and taken to the operating room where is put supine operative general anesthesia induced.  He was sterilely prepped draped the right neck and chest in usual fashion antibiotics were minister and timeout was called.  We began with longitudinal incision along the anterior border the sternocleidomastoid.  We dissected down to the internal jugular vein.  The carotid was actually quite deep.  We divided branches of the facial vein of which there were multiple.  We then identified the common carotid artery traced this back several centimeters placed umbilical tape around patient was fully heparinized.  ACT returned greater than 300 we dissected up to the external carotid artery.  We placed a vessel loop around this as well as a superior thyroidal branch.  We then identified the hypoglossal nerve.  We did divide the ansa between clips.  We reached the point of the internal carotid that appeared normal and placed Vesseloops around this.  A 10 French shunt was then prepared as well as a patch.  Blood pressure was map greater than 90.  We clamped the internal followed by common and external carotid arteries.  We opened the vessel longitudinally where  there was notably a ruptured plaque.  I tested the backbleeding which was pulsatile elected not to shunt given the higher tortuosity.  We proceeded with endarterectomy including eversion of the external.  We had good feathering distally.  We flushed with heparinized saline sewed a dacryon patch in place with 6-0 Prolene.  Prior to completion we allowed flushing all directions and then thoroughly irrigated with heparinized saline.  Upon completion we had very good signal in the ICA distally.  50 mg of protamine was administered.  We obtain hemostasis in the wound irrigated the wound closed in layers of Vicryl and Monocryl.  Dermabond placed to level skin.  He was then awakened anesthesia and was noted to be neurologically intact transferred to the recovery room in stable condition.  EBL: 100 cc    Beren Yniguez C. Randie Heinz, MD [facility] Office: [phone] Pager: [phone]

## 2018-11-07 NOTE — Progress Notes (Signed)
[name]  [id number] DOB: [birth year] DOA: 11/04/2018 PCP: [name], MD     Brief Narrative:  [name] an 85 y.o.malewith medical history significant of history of CAD, status post AICD placed for history of polymorphic V. Tach, chronic systolic CHF, DM 2, endocarditis of mitral valve, paroxysmal atrial fibrillation who presents withtemporary loss of vision in the right eye which happened 2 wks ago as well. He was admitted for a TIA work up and found to have right carotid artery stenosis.  New events last 24 hours / Subjective: Seen in PACU post right-sided CEA.  States that he feels like he "got hit by a truck."  Assessment & Plan:   Principal Problem:   Amaurosis fugax of right eye Active Problems:   ATRIAL FIBRILLATION   Automatic implantable cardioverter-defibrillator in situ   Asthma-COPD overlap syndrome ([place])   Type 2 diabetes mellitus with complication, without long-term current use of insulin (HCC)   TIA (transient ischemic attack)   CAD (coronary artery disease)   Hyponatremia   Preop cardiovascular exam   Amaurosis fugax of right eye, right carotid artery stenosis -Appreciate cardiology for cardiac preop evaluation -Vascular surgery following -Status post right CEA 10/8  Paroxysmal atrial fibrillation -CHA2DS2-VASc score 8 -Hold Eliquis, bridged with IV heparin -Continue amiodarone, metoprolol  Asthma-COPD overlap syndrome -Stable, continue Respimat  Type 2 diabetes mellitus without long-term use of insulin, well controlled -Ha1c 6.8  -Hold home medications -Continue sliding scale insulin while in the hospital  CAD status post CABG -Continue metoprolol, statin, aspirin   Chronic systolic and diastolic heart failure -Stable, continue metoprolol   DVT prophylaxis: IV heparin, resume Eliquis when okay with vascular surgery Code Status: Full code Family Communication: None Disposition Plan: Pending postop  course   Consultants:   Vascular surgery  Neurology  Cardiology   Antimicrobials:  Anti-infectives (From admission, onward)   Start     Dose/Rate Route Frequency Ordered Stop   11/07/18 1045  ceFAZolin (ANCEF) IVPB 2g/100 mL premix     2 g 200 mL/hr over 30 Minutes Intravenous Every 8 hours 11/07/18 1033 11/08/18 0244       Objective: Vitals:   11/07/18 1205 11/07/18 1235 11/07/18 1305 11/07/18 1335  BP: 108/64 109/61 (!) 108/59 104/63  Pulse: 65 66 62 60  Resp: 13 13 11 12   Temp:      TempSrc:      SpO2: 97% 97% 98% 98%  Weight:      Height:        Intake/Output Summary (Last 24 hours) at 11/07/2018 1343 Last data filed at 11/07/2018 0935 Gross per 24 hour  Intake 2316.1 ml  Output 26 ml  Net 2290.1 ml   Filed Weights   11/05/18 0459  Weight: 93.4 kg    Examination: General exam: Appears calm and comfortable  Respiratory system: Clear to auscultation. Respiratory effort normal. Cardiovascular system: S1 & S2 heard, RRR. No JVD, murmurs, rubs, gallops or clicks. No pedal edema. Gastrointestinal system: Abdomen is nondistended, soft and nontender. No organomegaly or masses felt. Normal bowel sounds heard. Central nervous system: Alert and oriented.  Speech clear Extremities: Symmetric Skin: No rashes, lesions or ulcers Psychiatry: Judgement and insight appear normal. Mood & affect appropriate.    Data Reviewed: I have personally reviewed following labs and imaging studies  CBC: Recent Labs  Lab 11/04/18 1635 11/04/18 1711 11/07/18 0317  WBC 5.6  --  4.5  NEUTROABS 4.0  --   --  HGB 14.1 14.6 12.4*  HCT 41.2 43.0 35.4*  MCV 103.0*  --  100.9*  PLT 124*  --  99991111*   Basic Metabolic Panel: Recent Labs  Lab 11/04/18 1635 11/04/18 1711  NA 130* 133*  K 4.6 4.5  CL 99 98  CO2 23  --   GLUCOSE 133* 128*  BUN 12 14  CREATININE 1.00 0.90  CALCIUM 9.2  --    GFR: Estimated Creatinine Clearance: 69.8 mL/min (by C-G formula based on SCr of 0.9  mg/dL). Liver Function Tests: Recent Labs  Lab 11/04/18 1635  AST 27  ALT 23  ALKPHOS 89  BILITOT 2.5*  PROT 7.5  ALBUMIN 4.3   No results for input(s): LIPASE, AMYLASE in the last 168 hours. No results for input(s): AMMONIA in the last 168 hours. Coagulation Profile: Recent Labs  Lab 11/04/18 1635  INR 1.4*   Cardiac Enzymes: No results for input(s): CKTOTAL, CKMB, CKMBINDEX, TROPONINI in the last 168 hours. BNP (last 3 results) Recent Labs    12/18/17 1506  PROBNP 1,465*   HbA1C: Recent Labs    11/04/18 2330  HGBA1C 6.8*   CBG: Recent Labs  Lab 11/06/18 1606 11/06/18 2154 11/07/18 0558 11/07/18 0936 11/07/18 1339  GLUCAP 168* 93 109* 114* 158*   Lipid Profile: Recent Labs    11/05/18 0256  CHOL 110  HDL 48  LDLCALC 44  TRIG 90  CHOLHDL 2.3   Thyroid Function Tests: No results for input(s): TSH, T4TOTAL, FREET4, T3FREE, THYROIDAB in the last 72 hours. Anemia Panel: No results for input(s): VITAMINB12, FOLATE, FERRITIN, TIBC, IRON, RETICCTPCT in the last 72 hours. Sepsis Labs: No results for input(s): PROCALCITON, LATICACIDVEN in the last 168 hours.  Recent Results (from the past 240 hour(s))  SARS CORONAVIRUS 2 (TAT 6-24 HRS) Nasopharyngeal Nasopharyngeal Swab     Status: None   Collection Time: 11/04/18 10:12 PM   Specimen: Nasopharyngeal Swab  Result Value Ref Range Status   SARS Coronavirus 2 NEGATIVE NEGATIVE Final    Comment: (NOTE) SARS-CoV-2 target nucleic acids are NOT DETECTED. The SARS-CoV-2 RNA is generally detectable in upper and lower respiratory specimens during the acute phase of infection. Negative results do not preclude SARS-CoV-2 infection, do not rule out co-infections with other pathogens, and should not be used as the sole basis for treatment or other patient management decisions. Negative results must be combined with clinical observations, patient history, and epidemiological information. The expected result is  Negative. Fact Sheet for Patients: SugarRoll.be Fact Sheet for Healthcare Providers: https://www.woods-mathews.com/ This test is not yet approved or cleared by the [place] FDA and  has been authorized for detection and/or diagnosis of SARS-CoV-2 by FDA under an Emergency Use Authorization (EUA). This EUA will remain  in effect (meaning this test can be used) for the duration of the COVID-19 declaration under Section 56 4(b)(1) of the Act, 21 U.S.C. section 360bbb-3(b)(1), unless the authorization is terminated or revoked sooner. Performed at [facility], [place] [address], [place], [place] [address]   MRSA PCR Screening     Status: None   Collection Time: 11/07/18  2:47 AM   Specimen: Nasal Mucosa; Nasopharyngeal  Result Value Ref Range Status   MRSA by PCR NEGATIVE NEGATIVE Final    Comment:        The GeneXpert MRSA Assay (FDA approved for NASAL specimens only), is one component of a comprehensive MRSA colonization surveillance program. It is not intended to diagnose MRSA infection nor to guide  or monitor treatment for MRSA infections. Performed at [facility], [place] [address], [place], [place] [address]       Radiology Studies: Vas US Carotid (at [facility] Only)  Result Date: 11/06/2018 Carotid Arterial Duplex Study Indications: TIA. Performing Technologist: C BYNUM  Examination Guidelines: A complete evaluation includes B-mode imaging, spectral Doppler, color Doppler, and power Doppler as needed of all accessible portions of each vessel. Bilateral testing is considered an integral part of a complete examination. Limited examinations for reoccurring indications may be performed as noted.  Right Carotid Findings: +----------+--------+--------+--------+-------------------------------+--------+           PSV cm/sEDV cm/sStenosisPlaque Description             Comments  +----------+--------+--------+--------+-------------------------------+--------+ CCA Prox  50      11                                                      +----------+--------+--------+--------+-------------------------------+--------+ CCA Distal43      13                                                      +----------+--------+--------+--------+-------------------------------+--------+ ICA Prox  496     162     80-99%  focal, heterogenous and                                                   calcific                                +----------+--------+--------+--------+-------------------------------+--------+ ICA Mid   72      13                                                      +----------+--------+--------+--------+-------------------------------+--------+ ICA Distal39      17                                                      +----------+--------+--------+--------+-------------------------------+--------+ ECA       69      14                                                      +----------+--------+--------+--------+-------------------------------+--------+ +----------+--------+-------+----------------+-------------------+           PSV cm/sEDV cmsDescribe        Arm Pressure (mmHG) +----------+--------+-------+----------------+-------------------+ [id number]            Multiphasic, WNL                    +----------+--------+-------+----------------+-------------------+ +---------+--------+--+--------+--+---------+  VertebralPSV cm/s39EDV cm/s14Antegrade +---------+--------+--+--------+--+---------+  Left Carotid Findings: +----------+--------+--------+--------+-------------------------------+--------+           PSV cm/sEDV cm/sStenosisPlaque Description             Comments +----------+--------+--------+--------+-------------------------------+--------+ CCA Prox  90      22                                                       +----------+--------+--------+--------+-------------------------------+--------+ CCA Distal62      16              focal, calcific and hyperechoic         +----------+--------+--------+--------+-------------------------------+--------+ ICA Prox  59      20              calcific                                +----------+--------+--------+--------+-------------------------------+--------+ ICA Mid   67      26                                                      +----------+--------+--------+--------+-------------------------------+--------+ ICA Distal52      19                                                      +----------+--------+--------+--------+-------------------------------+--------+ ECA       72      12              focal, hyperechoic and calcific         +----------+--------+--------+--------+-------------------------------+--------+ +----------+--------+--------+----------------+-------------------+           PSV cm/sEDV cm/sDescribe        Arm Pressure (mmHG) +----------+--------+--------+----------------+-------------------+ [id number]             Multiphasic, WNL                    +----------+--------+--------+----------------+-------------------+ +---------+--------+--+--------+--+---------+ VertebralPSV cm/s42EDV cm/s11Antegrade +---------+--------+--+--------+--+---------+  Summary: Right Carotid: Velocities in the right ICA are consistent with a 80-99%                stenosis. Left Carotid: There is no evidence of stenosis in the left ICA. Vertebrals:  Bilateral vertebral arteries demonstrate antegrade flow. Subclavians: Normal flow hemodynamics were seen in bilateral subclavian              arteries. *See table(s) above for measurements and observations.  Electronically signed by Harold Barban MD on 11/06/2018 at 6:59:05 AM.    Final       Scheduled Meds: . [MAR Hold] amiodarone  200 mg Oral BID  . [MAR Hold] arformoterol  15 mcg  Nebulization BID   And  . [MAR Hold] umeclidinium bromide  1 puff Inhalation Daily  . [MAR Hold] aspirin EC  81 mg Oral Daily  . [MAR Hold] atorvastatin  80 mg Oral QHS  . [MAR Hold] insulin aspart  0-5 Units Subcutaneous QHS  . [MAR Hold] insulin aspart  0-9 Units Subcutaneous TID WC  . [MAR Hold] metoprolol succinate  50 mg Oral Daily   Continuous Infusions: .  ceFAZolin (ANCEF) IV    . heparin 1,200 Units/hr (11/07/18 0600)  . remifentanil (ULTIVA) 2 mg in 100 mL normal saline (20 mcg/mL) Optime Stopped (11/07/18 0919)     LOS: 2 days      Time spent: 20 minutes   Dessa Phi, DO [facility] 11/07/2018, 1:43 PM   Available via Epic secure chat 7am-7pm After these hours, please refer to coverage provider listed on [email or web address]

## 2018-11-08 ENCOUNTER — Ambulatory Visit (INDEPENDENT_AMBULATORY_CARE_PROVIDER_SITE_OTHER): Payer: Medicare Other | Admitting: *Deleted

## 2018-11-08 ENCOUNTER — Encounter (HOSPITAL_COMMUNITY): Payer: Self-pay | Admitting: Vascular Surgery

## 2018-11-08 DIAGNOSIS — I509 Heart failure, unspecified: Secondary | ICD-10-CM

## 2018-11-08 DIAGNOSIS — I472 Ventricular tachycardia, unspecified: Secondary | ICD-10-CM

## 2018-11-08 LAB — BASIC METABOLIC PANEL
Anion gap: 8 (ref 5–15)
BUN: 13 mg/dL (ref 8–23)
CO2: 20 mmol/L — ABNORMAL LOW (ref 22–32)
Calcium: 8 mg/dL — ABNORMAL LOW (ref 8.9–10.3)
Chloride: 101 mmol/L (ref 98–111)
Creatinine, Ser: 0.89 mg/dL (ref 0.61–1.24)
GFR calc Af Amer: >60 mL/min (ref >60)
GFR calc non Af Amer: >60 mL/min (ref >60)
Glucose, Bld: 189 mg/dL — ABNORMAL HIGH (ref 70–99)
Potassium: 4.3 mmol/L (ref 3.5–5.1)
Sodium: 129 mmol/L — ABNORMAL LOW (ref 135–145)

## 2018-11-08 LAB — GLUCOSE, CAPILLARY: Glucose-Capillary: 164 mg/dL — ABNORMAL HIGH (ref 70–99)

## 2018-11-08 LAB — CBC
HCT: 32.9 % — ABNORMAL LOW (ref 39.0–52.0)
Hemoglobin: 11.8 g/dL — ABNORMAL LOW (ref 13.0–17.0)
MCH: 35.6 pg — ABNORMAL HIGH (ref 26.0–34.0)
MCHC: 35.9 g/dL (ref 30.0–36.0)
MCV: 99.4 fL (ref 80.0–100.0)
Platelets: 103 10*3/uL — ABNORMAL LOW (ref 150–400)
RBC: 3.31 MIL/uL — ABNORMAL LOW (ref 4.22–5.81)
RDW: 12.8 % (ref 11.5–15.5)
WBC: 7.1 10*3/uL (ref 4.0–10.5)
nRBC: 0 % (ref 0.0–0.2)

## 2018-11-08 LAB — HEPARIN LEVEL (UNFRACTIONATED): Heparin Unfractionated: 0.55 IU/mL (ref 0.30–0.70)

## 2018-11-08 LAB — APTT: aPTT: 82 seconds — ABNORMAL HIGH (ref 24–36)

## 2018-11-08 MED ORDER — OXYCODONE-ACETAMINOPHEN 5-325 MG PO TABS: 1.0000 | ORAL_TABLET | Freq: Four times a day (QID) | ORAL | 0 refills | Status: DC | PRN

## 2018-11-08 MED ORDER — APIXABAN 5 MG PO TABS
5.0000 mg | ORAL_TABLET | Freq: Two times a day (BID) | ORAL | Status: DC
  Administered 2018-11-08: 5 mg via ORAL
  Filled 2018-11-08: qty 1

## 2018-11-08 MED ORDER — POLYETHYLENE GLYCOL 3350 17 G PO PACK: 17.0000 g | PACK | Freq: Every day | ORAL | 0 refills | Status: DC | PRN

## 2018-11-08 NOTE — Progress Notes (Addendum)
  Progress Note    11/08/2018 7:16 AM 1 Day Post-Op  Subjective:  No complaints.  Denies trouble swallowing.  Afebrile HR 60's-80's  [id number] systolic [id number] RA  Vitals:   11/08/18 0230 11/08/18 0442  BP: (!) 106/54 (!) 102/56  Pulse: 66 70  Resp: 16 18  Temp: 98.1 F (36.7 C) 97.6 F (36.4 C)  SpO2: 99% 95%     Physical Exam: Neuro:  In tact; tongue is midline Lungs:  Non labored Incision:  Clean without hematoma; ecchymosis  CBC    Component Value Date/Time   WBC 7.1 11/08/2018 0130   RBC 3.31 (L) 11/08/2018 0130   HGB 11.8 (L) 11/08/2018 0130   HGB 13.4 10/23/2018 1318   HCT 32.9 (L) 11/08/2018 0130   HCT 39.3 10/23/2018 1318   PLT 103 (L) 11/08/2018 0130   PLT 130 (L) 10/23/2018 1318   MCV 99.4 11/08/2018 0130   MCV 100 (H) 10/23/2018 1318   MCH 35.6 (H) 11/08/2018 0130   MCHC 35.9 11/08/2018 0130   RDW 12.8 11/08/2018 0130   RDW 12.4 10/23/2018 1318   LYMPHSABS 0.6 (L) 11/04/2018 1635   LYMPHSABS 0.9 12/27/2016 1523   MONOABS 0.7 11/04/2018 1635   EOSABS 0.3 11/04/2018 1635   EOSABS 0.2 12/27/2016 1523   BASOSABS 0.0 11/04/2018 1635   BASOSABS 0.0 12/27/2016 1523    BMET    Component Value Date/Time   NA 129 (L) 11/08/2018 0130   NA 135 10/23/2018 1318   K 4.3 11/08/2018 0130   CL 101 11/08/2018 0130   CO2 20 (L) 11/08/2018 0130   GLUCOSE 189 (H) 11/08/2018 0130   BUN 13 11/08/2018 0130   BUN 16 10/23/2018 1318   CREATININE 0.89 11/08/2018 0130   CREATININE 1.08 01/04/2016 1630   CALCIUM 8.0 (L) 11/08/2018 0130   GFRNONAA >60 11/08/2018 0130   GFRAA >60 11/08/2018 0130     Intake/Output Summary (Last 24 hours) at 11/08/2018 0716 Last data filed at 11/08/2018 0400 Gross per 24 hour  Intake 2183.83 ml  Output 1050 ml  Net 1133.83 ml     Assessment/Plan:  This is a 85 y.o. male who is s/p right CEA 1 Day Post-Op  -pt is doing well this am. -pt neuro exam is in tact -pt has ambulated -IVF d/c'd -ok from vascular standpoint to  restart Eliquis. -okay to discharge from vascular standpoint. f/u with Dr. Donzetta Matters in 2 weeks and our office will call to arrange appt.  Will sign off.  If any issues over the weekend, please call.    Leontine Locket, PA-C [facility] [phone]  I have independently interviewed and examined patient and agree with PA assessment and plan above.  He is on Eliquis as well as aspirin and a statin.  Okay for discharge from vascular standpoint can follow-up with me in a few weeks for wound check.  If there are issues over the weekend please contact vascular surgery on-call.  Terrian Ridlon C. Donzetta Matters, MD [facility] [place] Office: [phone] Pager: [phone]

## 2018-11-08 NOTE — Plan of Care (Signed)
  Problem: Education: Goal: Knowledge of General Education information will improve Description: Including pain rating scale, medication(s)/side effects and non-pharmacologic comfort measures Outcome: Completed/Met   Problem: Health Behavior/Discharge Planning: Goal: Ability to manage health-related needs will improve Outcome: Completed/Met   Problem: Clinical Measurements: Goal: Ability to maintain clinical measurements within normal limits will improve Outcome: Completed/Met Goal: Will remain free from infection Outcome: Completed/Met Goal: Cardiovascular complication will be avoided Outcome: Completed/Met   Problem: Activity: Goal: Risk for activity intolerance will decrease Outcome: Completed/Met   Problem: Elimination: Goal: Will not experience complications related to bowel motility Outcome: Completed/Met Goal: Will not experience complications related to urinary retention Outcome: Completed/Met   Problem: Coping: Goal: Level of anxiety will decrease Outcome: Completed/Met   Problem: Nutrition: Goal: Adequate nutrition will be maintained Outcome: Completed/Met   Problem: Activity: Goal: Risk for activity intolerance will decrease Outcome: Completed/Met   Problem: Skin Integrity: Goal: Risk for impaired skin integrity will decrease Outcome: Completed/Met   Problem: Safety: Goal: Ability to remain free from injury will improve Outcome: Completed/Met   Problem: Skin Integrity: Goal: Risk for impaired skin integrity will decrease Outcome: Completed/Met   Problem: Education: Goal: Knowledge of disease or condition will improve Outcome: Completed/Met Goal: Knowledge of secondary prevention will improve Outcome: Completed/Met Goal: Knowledge of patient specific risk factors addressed and post discharge goals established will improve Outcome: Completed/Met

## 2018-11-08 NOTE — Progress Notes (Signed)
Patient ambulated in hallway approx 400 ft. Tolerated well.

## 2018-11-08 NOTE — Discharge Summary (Signed)
Physician Discharge Summary  [name] [id number] DOB: [birth year] DOA: 11/04/2018  PCP: [name], MD  Admit date: 11/04/2018 Discharge date: 11/08/2018  Admitted From: Home Disposition:  Home  Recommendations for Outpatient Follow-up:  1. Follow up with PCP in 1 week 2. Follow up with Dr. Donzetta Matters, Vascular Surgery, in 2-3 weeks  3. Follow up with [facility] in 4 weeks  4. Follow up with Dr. Burt Knack, Cardiology, in 2-4 weeks   Discharge Condition: Stable CODE STATUS: Full  Diet recommendation: Heart healthy, carb modified   Brief/Interim Summary: [name] an 85 y.o.malewith medical history significant of history of CAD, status post AICD placed for history of polymorphic V. Tach,chronic systolic CHF,DM 2, endocarditis of mitral valve, paroxysmal atrial fibrillation who presents withtemporaryloss of vision in the right eyewhich happened 2 wks ago as well. He was admitted for a TIA work up and found to have right carotid artery stenosis. He ultimately underwent right CEA on 10/8.  Discharge Diagnoses:   Amaurosis fugax of right eye, right carotid artery stenosis -Appreciate cardiology for cardiac preop evaluation -Vascular surgery consulted  -Status post right CEA 10/8  Paroxysmal atrial fibrillation -CHA2DS2-VASc score 8 -Resume eliquis  -Continue amiodarone, metoprolol  Asthma-COPD overlap syndrome -Stable, continue Respimat  Type 2 diabetes mellitus without long-term use of insulin, well controlled -Ha1c 6.8  -Resume home medications  -Continue sliding scale insulin while in the hospital  CAD status post CABG -Continue metoprolol, statin, aspirin   Chronic systolic and diastolic heart failure -Stable, continue metoprolol   Discharge Instructions  Discharge Instructions    Ambulatory referral to Neurology   Complete by: As directed    Follow up with stroke clinic NP (Jessica Vanschaick or Cecille Rubin, if both not  available, consider Zachery Dauer, or Ahern) at [facility] in about 4 weeks. Thanks.   Call MD for:  difficulty breathing, headache or visual disturbances   Complete by: As directed    Call MD for:  extreme fatigue   Complete by: As directed    Call MD for:  persistant dizziness or light-headedness   Complete by: As directed    Call MD for:  persistant nausea and vomiting   Complete by: As directed    Call MD for:  redness, tenderness, or signs of infection (pain, swelling, redness, odor or green/yellow discharge around incision site)   Complete by: As directed    Call MD for:  severe uncontrolled pain   Complete by: As directed    Call MD for:  temperature >100.4   Complete by: As directed    Diet - low sodium heart healthy   Complete by: As directed    Discharge instructions   Complete by: As directed    You were cared for by a hospitalist during your hospital stay. If you have any questions about your discharge medications or the care you received while you were in the hospital after you are discharged, you can call the unit and ask to speak with the hospitalist on call if the hospitalist that took care of you is not available. Once you are discharged, your primary care physician will handle any further medical issues. Please note that NO REFILLS for any discharge medications will be authorized once you are discharged, as it is imperative that you return to your primary care physician (or establish a relationship with a primary care physician if you do not have one) for your aftercare needs so that they can reassess your need for medications  and monitor your lab values.   Increase activity slowly   Complete by: As directed      Allergies as of 11/08/2018      Reactions   Latex Other (See Comments)   REDNESS AT REACTION SITE.   Metformin Diarrhea   Rivaroxaban Other (See Comments)   Headaches, blurred vision   Other Other (See Comments)   BEE STINGS   Sotalol Other (See Comments)    "BLUE TOES"- cut his circulation off   Warfarin Sodium Other (See Comments)   REACTION: migraine headaches/vision impariment      Medication List    STOP taking these medications   ibuprofen 200 MG tablet Commonly known as: ADVIL     TAKE these medications   acetaminophen 325 MG tablet Commonly known as: TYLENOL Take 2 tablets (650 mg total) by mouth every 6 (six) hours as needed for mild pain (or Fever >/= 101).   amiodarone 200 MG tablet Commonly known as: PACERONE Take 1 tablet (200 mg total) TWICE daily for one month, then reduce and take 1 tablet (200 mg daily) ONCE daily What changed:   how much to take  how to take this  when to take this   apixaban 5 MG Tabs tablet Commonly known as: Eliquis Take 1 tablet (5 mg total) by mouth 2 (two) times daily.   atorvastatin 80 MG tablet Commonly known as: LIPITOR Take 80 mg by mouth at bedtime.   EpiPen 2-Pak 0.3 mg/0.3 mL Soaj injection Generic drug: EPINEPHrine Inject 0.3 mg into the muscle daily as needed for anaphylaxis. USE ONLY IN EMERGENCY   fluticasone 50 MCG/ACT nasal spray Commonly known as: FLONASE Place 1 spray into the nose daily as needed for rhinitis or allergies.   furosemide 20 MG tablet Commonly known as: LASIX Take 1 tablet (20 mg total) by mouth daily as needed for fluid or edema.   Magnesium 250 MG Tabs Take 250 mg by mouth daily.   metoprolol succinate 50 MG 24 hr tablet Commonly known as: TOPROL-XL Take 1 tablet (50 mg total) by mouth 2 (two) times daily. Take with or immediately following a meal. What changed: when to take this   multivitamin with minerals Tabs tablet Take 1 tablet by mouth daily. Centrum Silver   nateglinide 120 MG tablet Commonly known as: STARLIX Take 120 mg by mouth Twice daily.   oxyCODONE-acetaminophen 5-325 MG tablet Commonly known as: PERCOCET/ROXICET Take 1 tablet by mouth every 6 (six) hours as needed for up to 7 days for moderate pain.   pioglitazone 45  MG tablet Commonly known as: ACTOS Take 45 mg by mouth daily.   polyethylene glycol 17 g packet Commonly known as: MIRALAX / GLYCOLAX Take 17 g by mouth daily as needed for mild constipation.   PreserVision AREDS 2 Caps Take 1 capsule by mouth 2 (two) times daily.   sitaGLIPtin 100 MG tablet Commonly known as: JANUVIA Take 100 mg by mouth at bedtime.   Tiotropium Bromide-Olodaterol 2.5-2.5 MCG/ACT Aers Commonly known as: Stiolto Respimat Inhale 2 puffs into the lungs daily.   Vitamin D-3 125 MCG (5000 UT) Tabs Take 5,000-10,000 Units by mouth See admin instructions. Take 5000 units by mouth on Sunday, Tuesday, Thursday, and Saturday. Take 10000 units by mouth on Monday, Wednesday, and Friday      Follow-up Information    [facility]. Schedule an appointment as soon as possible for a visit in 4 week(s).   Specialty: Neurology Contact information: [address]  [address] [place]       Waynetta Sandy, MD In 3 weeks.   Specialties: Vascular Surgery, Cardiology Why: Office will call you to arrange your appt (sent) Contact information: [place] [address] [phone]        [name], MD. Schedule an appointment as soon as possible for a visit in 1 week(s).   Specialty: Family Medicine Contact information: [place] [address] [phone]        Sherren Mocha, MD. Call.   Specialty: Cardiology Contact information: [id number] [place] [address] [phone]          Allergies  Allergen Reactions  . Latex Other (See Comments)    REDNESS AT REACTION SITE.  Marland Kitchen Metformin Diarrhea  . Rivaroxaban Other (See Comments)    Headaches, blurred vision  . Other Other (See Comments)    BEE STINGS   . Sotalol Other (See Comments)    "BLUE TOES"- cut his circulation off  . Warfarin Sodium Other (See Comments)    REACTION:  migraine headaches/vision impariment    Consultations:  Vascular surgery  Neurology  Cardiology    Procedures/Studies: Ct Angio Head W Or Wo Contrast  Result Date: 11/04/2018 CLINICAL DATA:  Intermittent right eye vision loss EXAM: CT ANGIOGRAPHY HEAD AND NECK TECHNIQUE: Multidetector CT imaging of the head and neck was performed using the standard protocol during bolus administration of intravenous contrast. Multiplanar CT image reconstructions and MIPs were obtained to evaluate the vascular anatomy. Carotid stenosis measurements (when applicable) are obtained utilizing NASCET criteria, using the distal internal carotid diameter as the denominator. CONTRAST:  17mL OMNIPAQUE IOHEXOL 350 MG/ML SOLN COMPARISON:  CTA head neck 12/31/2017 FINDINGS: CTA NECK FINDINGS SKELETON: There is no bony spinal canal stenosis. No lytic or blastic lesion. OTHER NECK: Normal pharynx, larynx and major salivary glands. No cervical lymphadenopathy. Unremarkable thyroid gland. UPPER CHEST: No pneumothorax or pleural effusion. No nodules or masses. AORTIC ARCH: There is mild calcific atherosclerosis of the aortic arch. There is no aneurysm, dissection or hemodynamically significant stenosis of the visualized portion of the aorta. Conventional 3 vessel aortic branching pattern. The visualized proximal subclavian arteries are widely patent. RIGHT CAROTID SYSTEM: No dissection, occlusion or aneurysm. There is mixed density atherosclerosis extending into the proximal ICA, resulting in approximately 90% stenosis. This stenosis has worsened from the prior study. LEFT CAROTID SYSTEM: No dissection, occlusion or aneurysm. Mild atherosclerotic calcification at the carotid bifurcation without hemodynamically significant stenosis. VERTEBRAL ARTERIES: Left dominant configuration. Both origins are clearly patent. There is no dissection, occlusion or flow-limiting stenosis to the skull base (V1-V3 segments). CTA HEAD FINDINGS POSTERIOR  CIRCULATION: --Vertebral arteries: Normal V4 segments. --Posterior inferior cerebellar arteries (PICA): Patent origins from the vertebral arteries. --Anterior inferior cerebellar arteries (AICA): Patent origins from the basilar artery. --Basilar artery: Normal. --Superior cerebellar arteries: Normal. --Posterior cerebral arteries: Normal. Both originate from the basilar artery. Posterior communicating arteries (p-comm) are diminutive or absent. ANTERIOR CIRCULATION: --Intracranial internal carotid arteries: Atherosclerotic calcification of the internal carotid arteries at the skull base without hemodynamically significant stenosis. --Anterior cerebral arteries (ACA): Normal. Both A1 segments are present. Patent anterior communicating artery (a-comm). --Middle cerebral arteries (MCA): Normal. VENOUS SINUSES: As permitted by contrast timing, patent. ANATOMIC VARIANTS: None Review of the MIP images confirms the above findings. IMPRESSION: 1. No emergent large vessel occlusion or high-grade stenosis of the intracranial arteries. 2. Increased, now approximately 90% stenosis  of the proximal right internal carotid artery secondary to mixed density atherosclerosis. 3. Aortic Atherosclerosis (ICD10-I70.0). Electronically Signed   By: Ulyses Jarred M.D.   On: 11/04/2018 23:43   Ct Head Wo Contrast  Result Date: 11/04/2018 CLINICAL DATA:  Right eye blurred vision. Transient loss of right eye vision 2 days ago. History of atrial fibrillation. EXAM: CT HEAD WITHOUT CONTRAST TECHNIQUE: Contiguous axial images were obtained from the base of the skull through the vertex without intravenous contrast. COMPARISON:  CT head 03/04/2018. FINDINGS: Brain: There is no evidence of acute intracranial hemorrhage, mass lesion, brain edema or extra-axial fluid collection. The ventricles and subarachnoid spaces are appropriately sized for age. Mild chronic small vessel ischemic changes in the periventricular white matter are stable. There is  no evidence of acute cortical stroke. Vascular: Intracranial vascular calcifications. No hyperdense vessel identified. Skull: Negative for fracture or focal lesion. Sinuses/Orbits: Minimal ethmoid sinus mucosal thickening and small bilateral mastoid effusions without coalescence. No orbital abnormalities. Other: None. IMPRESSION: Stable head CT without acute findings or explanation for the patient's symptoms. Stable mild chronic small vessel ischemic changes. Electronically Signed   By: Richardean Sale M.D.   On: 11/04/2018 17:09   Ct Angio Neck W And/or Wo Contrast  Result Date: 11/04/2018 CLINICAL DATA:  Intermittent right eye vision loss EXAM: CT ANGIOGRAPHY HEAD AND NECK TECHNIQUE: Multidetector CT imaging of the head and neck was performed using the standard protocol during bolus administration of intravenous contrast. Multiplanar CT image reconstructions and MIPs were obtained to evaluate the vascular anatomy. Carotid stenosis measurements (when applicable) are obtained utilizing NASCET criteria, using the distal internal carotid diameter as the denominator. CONTRAST:  92mL OMNIPAQUE IOHEXOL 350 MG/ML SOLN COMPARISON:  CTA head neck 12/31/2017 FINDINGS: CTA NECK FINDINGS SKELETON: There is no bony spinal canal stenosis. No lytic or blastic lesion. OTHER NECK: Normal pharynx, larynx and major salivary glands. No cervical lymphadenopathy. Unremarkable thyroid gland. UPPER CHEST: No pneumothorax or pleural effusion. No nodules or masses. AORTIC ARCH: There is mild calcific atherosclerosis of the aortic arch. There is no aneurysm, dissection or hemodynamically significant stenosis of the visualized portion of the aorta. Conventional 3 vessel aortic branching pattern. The visualized proximal subclavian arteries are widely patent. RIGHT CAROTID SYSTEM: No dissection, occlusion or aneurysm. There is mixed density atherosclerosis extending into the proximal ICA, resulting in approximately 90% stenosis. This  stenosis has worsened from the prior study. LEFT CAROTID SYSTEM: No dissection, occlusion or aneurysm. Mild atherosclerotic calcification at the carotid bifurcation without hemodynamically significant stenosis. VERTEBRAL ARTERIES: Left dominant configuration. Both origins are clearly patent. There is no dissection, occlusion or flow-limiting stenosis to the skull base (V1-V3 segments). CTA HEAD FINDINGS POSTERIOR CIRCULATION: --Vertebral arteries: Normal V4 segments. --Posterior inferior cerebellar arteries (PICA): Patent origins from the vertebral arteries. --Anterior inferior cerebellar arteries (AICA): Patent origins from the basilar artery. --Basilar artery: Normal. --Superior cerebellar arteries: Normal. --Posterior cerebral arteries: Normal. Both originate from the basilar artery. Posterior communicating arteries (p-comm) are diminutive or absent. ANTERIOR CIRCULATION: --Intracranial internal carotid arteries: Atherosclerotic calcification of the internal carotid arteries at the skull base without hemodynamically significant stenosis. --Anterior cerebral arteries (ACA): Normal. Both A1 segments are present. Patent anterior communicating artery (a-comm). --Middle cerebral arteries (MCA): Normal. VENOUS SINUSES: As permitted by contrast timing, patent. ANATOMIC VARIANTS: None Review of the MIP images confirms the above findings. IMPRESSION: 1. No emergent large vessel occlusion or high-grade stenosis of the intracranial arteries. 2. Increased, now approximately 90% stenosis of the proximal right  internal carotid artery secondary to mixed density atherosclerosis. 3. Aortic Atherosclerosis (ICD10-I70.0). Electronically Signed   By: Ulyses Jarred M.D.   On: 11/04/2018 23:43   Dg Chest Port 1 View  Result Date: 11/04/2018 CLINICAL DATA:  TIA EXAM: PORTABLE CHEST 1 VIEW COMPARISON:  March 04, 2018 FINDINGS: There is cardiomegaly. Overlying median sternotomy wires. Left-sided pacemaker seen with the tips in the  right atrium and right ventricle. Both lungs are clear. No acute osseous abnormality. IMPRESSION: No acute cardiopulmonary process.  Stable cardiomegaly Electronically Signed   By: Prudencio Pair M.D.   On: 11/04/2018 23:21   Vas US Carotid (at [facility] Only)  Result Date: 11/06/2018 Carotid Arterial Duplex Study Indications: TIA. Performing Technologist: C BYNUM  Examination Guidelines: A complete evaluation includes B-mode imaging, spectral Doppler, color Doppler, and power Doppler as needed of all accessible portions of each vessel. Bilateral testing is considered an integral part of a complete examination. Limited examinations for reoccurring indications may be performed as noted.  Right Carotid Findings: +----------+--------+--------+--------+-------------------------------+--------+           PSV cm/sEDV cm/sStenosisPlaque Description             Comments +----------+--------+--------+--------+-------------------------------+--------+ CCA Prox  50      11                                                      +----------+--------+--------+--------+-------------------------------+--------+ CCA Distal43      13                                                      +----------+--------+--------+--------+-------------------------------+--------+ ICA Prox  496     162     80-99%  focal, heterogenous and                                                   calcific                                +----------+--------+--------+--------+-------------------------------+--------+ ICA Mid   72      13                                                      +----------+--------+--------+--------+-------------------------------+--------+ ICA Distal39      17                                                      +----------+--------+--------+--------+-------------------------------+--------+ ECA       69      14                                                       +----------+--------+--------+--------+-------------------------------+--------+ +----------+--------+-------+----------------+-------------------+  PSV cm/sEDV cmsDescribe        Arm Pressure (mmHG) +----------+--------+-------+----------------+-------------------+ [id number]            Multiphasic, WNL                    +----------+--------+-------+----------------+-------------------+ +---------+--------+--+--------+--+---------+ VertebralPSV cm/s39EDV cm/s14Antegrade +---------+--------+--+--------+--+---------+  Left Carotid Findings: +----------+--------+--------+--------+-------------------------------+--------+           PSV cm/sEDV cm/sStenosisPlaque Description             Comments +----------+--------+--------+--------+-------------------------------+--------+ CCA Prox  90      22                                                      +----------+--------+--------+--------+-------------------------------+--------+ CCA Distal62      16              focal, calcific and hyperechoic         +----------+--------+--------+--------+-------------------------------+--------+ ICA Prox  59      20              calcific                                +----------+--------+--------+--------+-------------------------------+--------+ ICA Mid   67      26                                                      +----------+--------+--------+--------+-------------------------------+--------+ ICA Distal52      19                                                      +----------+--------+--------+--------+-------------------------------+--------+ ECA       72      12              focal, hyperechoic and calcific         +----------+--------+--------+--------+-------------------------------+--------+ +----------+--------+--------+----------------+-------------------+           PSV cm/sEDV cm/sDescribe        Arm Pressure (mmHG)  +----------+--------+--------+----------------+-------------------+ [id number]             Multiphasic, WNL                    +----------+--------+--------+----------------+-------------------+ +---------+--------+--+--------+--+---------+ VertebralPSV cm/s42EDV cm/s11Antegrade +---------+--------+--+--------+--+---------+  Summary: Right Carotid: Velocities in the right ICA are consistent with a 80-99%                stenosis. Left Carotid: There is no evidence of stenosis in the left ICA. Vertebrals:  Bilateral vertebral arteries demonstrate antegrade flow. Subclavians: Normal flow hemodynamics were seen in bilateral subclavian              arteries. *See table(s) above for measurements and observations.  Electronically signed by Harold Barban MD on 11/06/2018 at 6:59:05 AM.    Final        Discharge Exam: Vitals:   11/08/18 0751 11/08/18 0817  BP: 127/72   Pulse: 75   Resp: 20   Temp: 98.2  F (36.8 C)   SpO2: 98% 98%    General: Pt is alert, awake, not in acute distress Cardiovascular: RRR, paced beats on telemetry, S1/S2 +, no edema, right anterior neck with bruising and clean dry dressing  Respiratory: CTA bilaterally, no wheezing, no rhonchi, no respiratory distress, no conversational dyspnea  Abdominal: Soft, NT, ND, bowel sounds + Extremities: no edema, no cyanosis Psych: Normal mood and affect, stable judgement and insight     The results of significant diagnostics from this hospitalization (including imaging, microbiology, ancillary and laboratory) are listed below for reference.     Microbiology: Recent Results (from the past 240 hour(s))  SARS CORONAVIRUS 2 (TAT 6-24 HRS) Nasopharyngeal Nasopharyngeal Swab     Status: None   Collection Time: 11/04/18 10:12 PM   Specimen: Nasopharyngeal Swab  Result Value Ref Range Status   SARS Coronavirus 2 NEGATIVE NEGATIVE Final    Comment: (NOTE) SARS-CoV-2 target nucleic acids are NOT DETECTED. The SARS-CoV-2 RNA  is generally detectable in upper and lower respiratory specimens during the acute phase of infection. Negative results do not preclude SARS-CoV-2 infection, do not rule out co-infections with other pathogens, and should not be used as the sole basis for treatment or other patient management decisions. Negative results must be combined with clinical observations, patient history, and epidemiological information. The expected result is Negative. Fact Sheet for Patients: SugarRoll.be Fact Sheet for Healthcare Providers: https://www.woods-mathews.com/ This test is not yet approved or cleared by the [place] FDA and  has been authorized for detection and/or diagnosis of SARS-CoV-2 by FDA under an Emergency Use Authorization (EUA). This EUA will remain  in effect (meaning this test can be used) for the duration of the COVID-19 declaration under Section 56 4(b)(1) of the Act, 21 U.S.C. section 360bbb-3(b)(1), unless the authorization is terminated or revoked sooner. Performed at [facility], [place] [address], [place], [place] [address]   MRSA PCR Screening     Status: None   Collection Time: 11/07/18  2:47 AM   Specimen: Nasal Mucosa; Nasopharyngeal  Result Value Ref Range Status   MRSA by PCR NEGATIVE NEGATIVE Final    Comment:        The GeneXpert MRSA Assay (FDA approved for NASAL specimens only), is one component of a comprehensive MRSA colonization surveillance program. It is not intended to diagnose MRSA infection nor to guide or monitor treatment for MRSA infections. Performed at [facility], [place] [address] [place], [place] [address]      Labs: BNP (last 3 results) Recent Labs    03/04/18 0030  BNP [id number]   Basic Metabolic Panel: Recent Labs  Lab 11/04/18 1635 11/04/18 1711 11/08/18 0130  NA 130* 133* 129*  K 4.6 4.5 4.3  CL 99 98 101  CO2 23  --  20*  GLUCOSE 133* 128* 189*  BUN 12 14 13    CREATININE 1.00 0.90 0.89  CALCIUM 9.2  --  8.0*   Liver Function Tests: Recent Labs  Lab 11/04/18 1635  AST 27  ALT 23  ALKPHOS 89  BILITOT 2.5*  PROT 7.5  ALBUMIN 4.3   No results for input(s): LIPASE, AMYLASE in the last 168 hours. No results for input(s): AMMONIA in the last 168 hours. CBC: Recent Labs  Lab 11/04/18 1635 11/04/18 1711 11/07/18 0317 11/08/18 0130  WBC 5.6  --  4.5 7.1  NEUTROABS 4.0  --   --   --   HGB 14.1 14.6 12.4* 11.8*  HCT  41.2 43.0 35.4* 32.9*  MCV 103.0*  --  100.9* 99.4  PLT 124*  --  114* 103*   Cardiac Enzymes: No results for input(s): CKTOTAL, CKMB, CKMBINDEX, TROPONINI in the last 168 hours. BNP: Invalid input(s): POCBNP CBG: Recent Labs  Lab 11/07/18 0936 11/07/18 1339 11/07/18 1557 11/07/18 2213 11/08/18 0628  GLUCAP 114* 158* 152* 215* 164*   D-Dimer No results for input(s): DDIMER in the last 72 hours. Hgb A1c No results for input(s): HGBA1C in the last 72 hours. Lipid Profile No results for input(s): CHOL, HDL, LDLCALC, TRIG, CHOLHDL, LDLDIRECT in the last 72 hours. Thyroid function studies No results for input(s): TSH, T4TOTAL, T3FREE, THYROIDAB in the last 72 hours.  Invalid input(s): FREET3 Anemia work up No results for input(s): VITAMINB12, FOLATE, FERRITIN, TIBC, IRON, RETICCTPCT in the last 72 hours. Urinalysis    Component Value Date/Time   COLORURINE YELLOW 11/05/2018 0019   COLORURINE YELLOW 11/05/2018 0019   APPEARANCEUR CLEAR 11/05/2018 0019   APPEARANCEUR CLEAR 11/05/2018 0019   LABSPEC 1.018 11/05/2018 0019   LABSPEC 1.018 11/05/2018 0019   PHURINE 6.0 11/05/2018 0019   PHURINE 6.0 11/05/2018 0019   GLUCOSEU NEGATIVE 11/05/2018 0019   GLUCOSEU NEGATIVE 11/05/2018 0019   HGBUR NEGATIVE 11/05/2018 0019   HGBUR NEGATIVE 11/05/2018 0019   BILIRUBINUR NEGATIVE 11/05/2018 0019   BILIRUBINUR NEGATIVE 11/05/2018 0019   KETONESUR NEGATIVE 11/05/2018 0019   KETONESUR NEGATIVE 11/05/2018 0019    PROTEINUR NEGATIVE 11/05/2018 0019   PROTEINUR NEGATIVE 11/05/2018 0019   NITRITE NEGATIVE 11/05/2018 0019   NITRITE NEGATIVE 11/05/2018 0019   LEUKOCYTESUR NEGATIVE 11/05/2018 0019   LEUKOCYTESUR NEGATIVE 11/05/2018 0019   Sepsis Labs Invalid input(s): PROCALCITONIN,  WBC,  LACTICIDVEN Microbiology Recent Results (from the past 240 hour(s))  SARS CORONAVIRUS 2 (TAT 6-24 HRS) Nasopharyngeal Nasopharyngeal Swab     Status: None   Collection Time: 11/04/18 10:12 PM   Specimen: Nasopharyngeal Swab  Result Value Ref Range Status   SARS Coronavirus 2 NEGATIVE NEGATIVE Final    Comment: (NOTE) SARS-CoV-2 target nucleic acids are NOT DETECTED. The SARS-CoV-2 RNA is generally detectable in upper and lower respiratory specimens during the acute phase of infection. Negative results do not preclude SARS-CoV-2 infection, do not rule out co-infections with other pathogens, and should not be used as the sole basis for treatment or other patient management decisions. Negative results must be combined with clinical observations, patient history, and epidemiological information. The expected result is Negative. Fact Sheet for Patients: SugarRoll.be Fact Sheet for Healthcare Providers: https://www.woods-mathews.com/ This test is not yet approved or cleared by the [place] FDA and  has been authorized for detection and/or diagnosis of SARS-CoV-2 by FDA under an Emergency Use Authorization (EUA). This EUA will remain  in effect (meaning this test can be used) for the duration of the COVID-19 declaration under Section 56 4(b)(1) of the Act, 21 U.S.C. section 360bbb-3(b)(1), unless the authorization is terminated or revoked sooner. Performed at [facility], [place] [address] [place], [place] [address]   MRSA PCR Screening     Status: None   Collection Time: 11/07/18  2:47 AM   Specimen: Nasal Mucosa; Nasopharyngeal  Result Value Ref Range Status    MRSA by PCR NEGATIVE NEGATIVE Final    Comment:        The GeneXpert MRSA Assay (FDA approved for NASAL specimens only), is one component of a comprehensive MRSA colonization surveillance program. It is not intended to diagnose MRSA infection nor to guide  or monitor treatment for MRSA infections. Performed at [facility], [place] [address] [place], [place] [address]      Patient was seen and examined on the day of discharge and was found to be in stable condition. Time coordinating discharge: 25 minutes including assessment and coordination of care, as well as examination of the patient.   SIGNED:  Dessa Phi, DO [facility] 11/08/2018, 8:42 AM

## 2018-11-08 NOTE — Progress Notes (Signed)
Patient in a stable condition, discharge education reviewed with patient he verbalized understanding, iv removed, patient belongings at bedside, patient awaiting his wife for transportation home.

## 2018-11-08 NOTE — Progress Notes (Signed)
ANTICOAGULATION CONSULT NOTE - Follow Up Consult  Pharmacy Consult for Heparin (while off Eliquis for surgery) Indication: atrial fibrillation  Allergies  Allergen Reactions  . Latex Other (See Comments)    REDNESS AT REACTION SITE.  Marland Kitchen Metformin Diarrhea  . Rivaroxaban Other (See Comments)    Headaches, blurred vision  . Other Other (See Comments)    BEE STINGS   . Sotalol Other (See Comments)    "BLUE TOES"- cut his circulation off  . Warfarin Sodium Other (See Comments)    REACTION: migraine headaches/vision impariment    Patient Measurements: Height: 6\' 2"  (188 cm) Weight: 206 lb (93.4 kg) IBW/kg (Calculated) : 82.2 Heparin Dosing Weight: 93.4 kg  Vital Signs: Temp: 98.1 F (36.7 C) (10/09 0230) Temp Source: Oral (10/09 0230) BP: 106/54 (10/09 0230) Pulse Rate: 66 (10/09 0230)  Labs: Recent Labs    11/06/18 0906 11/06/18 1951 11/07/18 0317 11/08/18 0130  HGB  --   --  12.4* 11.8*  HCT  --   --  35.4* 32.9*  PLT  --   --  114* 103*  APTT 35 160* 37* 82*  HEPARINUNFRC 1.98*  --  0.98* 0.55  CREATININE  --   --   --  0.89    Estimated Creatinine Clearance: 70.6 mL/min (by C-G formula based on SCr of 0.89 mg/dL).  Assessment:  85 yr old male on Eliquis 5 mg BID prior to admission for atrial fibrillation.  Now with Amaurosis fugax of right eye. Transitioned to IV heparin on 10/7 for R CEA on 10/8.  Last Eliquis dose ~10pm on 10/6.  Hep lvl and aPTT now correlating and within range  Goal of Therapy:  Heparin level 0.3-0.7 units/ml aPTT 66-102 seconds Monitor platelets by anticoagulation protocol: Yes   Plan:  Continue heparin 1200 units/hr Will cancel am Hep lvl since too close (move to Sat am if continues on hep) F/U plan to resume apixaban  Levester Fresh, PharmD, BCPS, BCCCP Clinical Pharmacist [phone]  Please check AMION for all [place] numbers  11/08/2018 3:02 AM

## 2018-11-09 DIAGNOSIS — Z95810 Presence of automatic (implantable) cardiac defibrillator: Secondary | ICD-10-CM | POA: Diagnosis not present

## 2018-11-09 DIAGNOSIS — Z951 Presence of aortocoronary bypass graft: Secondary | ICD-10-CM | POA: Diagnosis not present

## 2018-11-09 DIAGNOSIS — J449 Chronic obstructive pulmonary disease, unspecified: Secondary | ICD-10-CM | POA: Diagnosis not present

## 2018-11-09 DIAGNOSIS — I25118 Atherosclerotic heart disease of native coronary artery with other forms of angina pectoris: Secondary | ICD-10-CM | POA: Diagnosis not present

## 2018-11-09 DIAGNOSIS — G453 Amaurosis fugax: Secondary | ICD-10-CM | POA: Diagnosis not present

## 2018-11-09 DIAGNOSIS — Z7984 Long term (current) use of oral hypoglycemic drugs: Secondary | ICD-10-CM | POA: Diagnosis not present

## 2018-11-09 DIAGNOSIS — Z8673 Personal history of transient ischemic attack (TIA), and cerebral infarction without residual deficits: Secondary | ICD-10-CM | POA: Diagnosis not present

## 2018-11-09 DIAGNOSIS — Z7901 Long term (current) use of anticoagulants: Secondary | ICD-10-CM | POA: Diagnosis not present

## 2018-11-09 DIAGNOSIS — R55 Syncope and collapse: Secondary | ICD-10-CM | POA: Diagnosis not present

## 2018-11-09 DIAGNOSIS — Z48812 Encounter for surgical aftercare following surgery on the circulatory system: Secondary | ICD-10-CM | POA: Diagnosis not present

## 2018-11-09 DIAGNOSIS — E119 Type 2 diabetes mellitus without complications: Secondary | ICD-10-CM | POA: Diagnosis not present

## 2018-11-09 DIAGNOSIS — I472 Ventricular tachycardia: Secondary | ICD-10-CM | POA: Diagnosis not present

## 2018-11-09 DIAGNOSIS — I7300 Raynaud's syndrome without gangrene: Secondary | ICD-10-CM | POA: Diagnosis not present

## 2018-11-09 DIAGNOSIS — Z8781 Personal history of (healed) traumatic fracture: Secondary | ICD-10-CM | POA: Diagnosis not present

## 2018-11-09 DIAGNOSIS — I5022 Chronic systolic (congestive) heart failure: Secondary | ICD-10-CM | POA: Diagnosis not present

## 2018-11-09 DIAGNOSIS — I480 Paroxysmal atrial fibrillation: Secondary | ICD-10-CM | POA: Diagnosis not present

## 2018-11-09 DIAGNOSIS — I4891 Unspecified atrial fibrillation: Secondary | ICD-10-CM | POA: Diagnosis not present

## 2018-11-09 LAB — CUP PACEART REMOTE DEVICE CHECK
Battery Remaining Longevity: 33 mo
Battery Voltage: 2.94 V
Brady Statistic AP VP Percent: 1.25 %
Brady Statistic AP VS Percent: 0.14 %
Brady Statistic AS VP Percent: 66 %
Brady Statistic AS VS Percent: 32.61 %
Brady Statistic RA Percent Paced: 0.98 %
Brady Statistic RV Percent Paced: 69.92 %
Date Time Interrogation Session: 20201009180120
HighPow Impedance: 38 Ohm
HighPow Impedance: 51 Ohm
Implantable Lead Implant Date: 20040309
Implantable Lead Implant Date: 20040309
Implantable Lead Location: 753859
Implantable Lead Location: 753860
Implantable Lead Model: 158
Implantable Lead Model: 5076
Implantable Lead Serial Number: 115061
Implantable Pulse Generator Implant Date: 20161007
Lead Channel Impedance Value: 399 Ohm
Lead Channel Impedance Value: 4047 Ohm
Lead Channel Impedance Value: 4047 Ohm
Lead Channel Impedance Value: 4047 Ohm
Lead Channel Impedance Value: 4047 Ohm
Lead Channel Impedance Value: 4047 Ohm
Lead Channel Impedance Value: 4047 Ohm
Lead Channel Impedance Value: 4047 Ohm
Lead Channel Impedance Value: 4047 Ohm
Lead Channel Impedance Value: 4047 Ohm
Lead Channel Impedance Value: 4047 Ohm
Lead Channel Impedance Value: 418 Ohm
Lead Channel Impedance Value: 456 Ohm
Lead Channel Pacing Threshold Amplitude: 0.625 V
Lead Channel Pacing Threshold Amplitude: 0.625 V
Lead Channel Pacing Threshold Pulse Width: 0.4 ms
Lead Channel Pacing Threshold Pulse Width: 0.4 ms
Lead Channel Sensing Intrinsic Amplitude: 1.125 mV
Lead Channel Sensing Intrinsic Amplitude: 1.125 mV
Lead Channel Sensing Intrinsic Amplitude: 5.375 mV
Lead Channel Sensing Intrinsic Amplitude: 5.375 mV
Lead Channel Setting Pacing Amplitude: 2 V
Lead Channel Setting Pacing Amplitude: 2.5 V
Lead Channel Setting Pacing Pulse Width: 0.4 ms
Lead Channel Setting Sensing Sensitivity: 0.3 mV

## 2018-11-10 LAB — POCT ACTIVATED CLOTTING TIME: Activated Clotting Time: 340 seconds

## 2018-11-11 ENCOUNTER — Other Ambulatory Visit: Payer: Self-pay

## 2018-11-11 ENCOUNTER — Emergency Department (HOSPITAL_COMMUNITY): Payer: Medicare Other

## 2018-11-11 ENCOUNTER — Inpatient Hospital Stay (HOSPITAL_COMMUNITY)
Admission: EM | Admit: 2018-11-11 | Discharge: 2018-11-16 | DRG: 292 | Disposition: A | Discharge status: Home / Self Care | Payer: Medicare Other | Attending: Internal Medicine | Admitting: Internal Medicine

## 2018-11-11 ENCOUNTER — Telehealth: Payer: Self-pay

## 2018-11-11 DIAGNOSIS — I509 Heart failure, unspecified: Secondary | ICD-10-CM | POA: Insufficient documentation

## 2018-11-11 DIAGNOSIS — Z20828 Contact with and (suspected) exposure to other viral communicable diseases: Secondary | ICD-10-CM | POA: Diagnosis present

## 2018-11-11 DIAGNOSIS — I110 Hypertensive heart disease with heart failure: Principal | ICD-10-CM | POA: Diagnosis present

## 2018-11-11 DIAGNOSIS — E1151 Type 2 diabetes mellitus with diabetic peripheral angiopathy without gangrene: Secondary | ICD-10-CM | POA: Diagnosis present

## 2018-11-11 DIAGNOSIS — Z833 Family history of diabetes mellitus: Secondary | ICD-10-CM

## 2018-11-11 DIAGNOSIS — I4891 Unspecified atrial fibrillation: Secondary | ICD-10-CM | POA: Diagnosis present

## 2018-11-11 DIAGNOSIS — R0689 Other abnormalities of breathing: Secondary | ICD-10-CM | POA: Diagnosis not present

## 2018-11-11 DIAGNOSIS — I480 Paroxysmal atrial fibrillation: Secondary | ICD-10-CM | POA: Diagnosis present

## 2018-11-11 DIAGNOSIS — Z87891 Personal history of nicotine dependence: Secondary | ICD-10-CM

## 2018-11-11 DIAGNOSIS — I4821 Permanent atrial fibrillation: Secondary | ICD-10-CM

## 2018-11-11 DIAGNOSIS — L7622 Postprocedural hemorrhage and hematoma of skin and subcutaneous tissue following other procedure: Secondary | ICD-10-CM | POA: Diagnosis not present

## 2018-11-11 DIAGNOSIS — Z823 Family history of stroke: Secondary | ICD-10-CM

## 2018-11-11 DIAGNOSIS — Z7984 Long term (current) use of oral hypoglycemic drugs: Secondary | ICD-10-CM

## 2018-11-11 DIAGNOSIS — Z7901 Long term (current) use of anticoagulants: Secondary | ICD-10-CM

## 2018-11-11 DIAGNOSIS — Z8673 Personal history of transient ischemic attack (TIA), and cerebral infarction without residual deficits: Secondary | ICD-10-CM

## 2018-11-11 DIAGNOSIS — R0602 Shortness of breath: Secondary | ICD-10-CM | POA: Diagnosis not present

## 2018-11-11 DIAGNOSIS — I5043 Acute on chronic combined systolic (congestive) and diastolic (congestive) heart failure: Secondary | ICD-10-CM | POA: Diagnosis present

## 2018-11-11 DIAGNOSIS — Z952 Presence of prosthetic heart valve: Secondary | ICD-10-CM

## 2018-11-11 DIAGNOSIS — I2510 Atherosclerotic heart disease of native coronary artery without angina pectoris: Secondary | ICD-10-CM | POA: Diagnosis present

## 2018-11-11 DIAGNOSIS — I255 Ischemic cardiomyopathy: Secondary | ICD-10-CM | POA: Diagnosis present

## 2018-11-11 DIAGNOSIS — Z79899 Other long term (current) drug therapy: Secondary | ICD-10-CM

## 2018-11-11 DIAGNOSIS — Z91040 Latex allergy status: Secondary | ICD-10-CM

## 2018-11-11 DIAGNOSIS — Z8774 Personal history of (corrected) congenital malformations of heart and circulatory system: Secondary | ICD-10-CM

## 2018-11-11 DIAGNOSIS — J449 Chronic obstructive pulmonary disease, unspecified: Secondary | ICD-10-CM | POA: Diagnosis present

## 2018-11-11 DIAGNOSIS — E871 Hypo-osmolality and hyponatremia: Secondary | ICD-10-CM | POA: Diagnosis not present

## 2018-11-11 DIAGNOSIS — L7632 Postprocedural hematoma of skin and subcutaneous tissue following other procedure: Secondary | ICD-10-CM

## 2018-11-11 DIAGNOSIS — Z951 Presence of aortocoronary bypass graft: Secondary | ICD-10-CM

## 2018-11-11 DIAGNOSIS — I495 Sick sinus syndrome: Secondary | ICD-10-CM | POA: Diagnosis present

## 2018-11-11 DIAGNOSIS — I5023 Acute on chronic systolic (congestive) heart failure: Secondary | ICD-10-CM | POA: Diagnosis not present

## 2018-11-11 DIAGNOSIS — E118 Type 2 diabetes mellitus with unspecified complications: Secondary | ICD-10-CM | POA: Diagnosis present

## 2018-11-11 DIAGNOSIS — Z825 Family history of asthma and other chronic lower respiratory diseases: Secondary | ICD-10-CM

## 2018-11-11 DIAGNOSIS — I5020 Unspecified systolic (congestive) heart failure: Secondary | ICD-10-CM | POA: Insufficient documentation

## 2018-11-11 DIAGNOSIS — Z888 Allergy status to other drugs, medicaments and biological substances status: Secondary | ICD-10-CM

## 2018-11-11 DIAGNOSIS — Z95810 Presence of automatic (implantable) cardiac defibrillator: Secondary | ICD-10-CM | POA: Diagnosis present

## 2018-11-11 DIAGNOSIS — I7300 Raynaud's syndrome without gangrene: Secondary | ICD-10-CM | POA: Diagnosis present

## 2018-11-11 DIAGNOSIS — I4819 Other persistent atrial fibrillation: Secondary | ICD-10-CM | POA: Diagnosis present

## 2018-11-11 DIAGNOSIS — Z8249 Family history of ischemic heart disease and other diseases of the circulatory system: Secondary | ICD-10-CM

## 2018-11-11 DIAGNOSIS — J4489 Other specified chronic obstructive pulmonary disease: Secondary | ICD-10-CM | POA: Diagnosis present

## 2018-11-11 HISTORY — DX: Acute on chronic combined systolic (congestive) and diastolic (congestive) heart failure: I50.43

## 2018-11-11 HISTORY — DX: Chronic combined systolic (congestive) and diastolic (congestive) heart failure: I50.42

## 2018-11-11 HISTORY — DX: Occlusion and stenosis of right carotid artery: I65.21

## 2018-11-11 LAB — CBC
HCT: 33.9 % — ABNORMAL LOW (ref 39.0–52.0)
Hemoglobin: 12.2 g/dL — ABNORMAL LOW (ref 13.0–17.0)
MCH: 35.6 pg — ABNORMAL HIGH (ref 26.0–34.0)
MCHC: 36 g/dL (ref 30.0–36.0)
MCV: 98.8 fL (ref 80.0–100.0)
Platelets: 144 10*3/uL — ABNORMAL LOW (ref 150–400)
RBC: 3.43 MIL/uL — ABNORMAL LOW (ref 4.22–5.81)
RDW: 12.6 % (ref 11.5–15.5)
WBC: 6.9 10*3/uL (ref 4.0–10.5)
nRBC: 0 % (ref 0.0–0.2)

## 2018-11-11 LAB — PROTIME-INR
INR: 1.4 — ABNORMAL HIGH (ref 0.8–1.2)
Prothrombin Time: 17.3 seconds — ABNORMAL HIGH (ref 11.4–15.2)

## 2018-11-11 LAB — SARS CORONAVIRUS 2 (TAT 6-24 HRS): SARS Coronavirus 2: NEGATIVE

## 2018-11-11 LAB — BASIC METABOLIC PANEL
Anion gap: 11 (ref 5–15)
BUN: 6 mg/dL — ABNORMAL LOW (ref 8–23)
CO2: 22 mmol/L (ref 22–32)
Calcium: 8.4 mg/dL — ABNORMAL LOW (ref 8.9–10.3)
Chloride: 89 mmol/L — ABNORMAL LOW (ref 98–111)
Creatinine, Ser: 0.74 mg/dL (ref 0.61–1.24)
GFR calc Af Amer: >60 mL/min (ref >60)
GFR calc non Af Amer: >60 mL/min (ref >60)
Glucose, Bld: 178 mg/dL — ABNORMAL HIGH (ref 70–99)
Potassium: 4.4 mmol/L (ref 3.5–5.1)
Sodium: 122 mmol/L — ABNORMAL LOW (ref 135–145)

## 2018-11-11 LAB — BRAIN NATRIURETIC PEPTIDE: B Natriuretic Peptide: 1295.4 pg/mL — ABNORMAL HIGH (ref 0.0–100.0)

## 2018-11-11 LAB — CBG MONITORING, ED: Glucose-Capillary: 202 mg/dL — ABNORMAL HIGH (ref 70–99)

## 2018-11-11 LAB — TROPONIN I (HIGH SENSITIVITY)
Troponin I (High Sensitivity): 14 ng/L (ref <18)
Troponin I (High Sensitivity): 13 ng/L (ref <18)

## 2018-11-11 MED ORDER — VITAMIN D-3 125 MCG (5000 UT) PO TABS: 5000.0000 [IU] | ORAL_TABLET | ORAL | Status: DC

## 2018-11-11 MED ORDER — ACETAMINOPHEN 325 MG PO TABS: 650.0000 mg | ORAL_TABLET | Freq: Four times a day (QID) | ORAL | Status: DC | PRN

## 2018-11-11 MED ORDER — LINAGLIPTIN 5 MG PO TABS
5.0000 mg | ORAL_TABLET | Freq: Every day | ORAL | Status: DC
  Administered 2018-11-11 – 2018-11-16 (×6): 5 mg via ORAL
  Filled 2018-11-11 (×6): qty 1

## 2018-11-11 MED ORDER — METOPROLOL SUCCINATE ER 50 MG PO TB24
50.0000 mg | ORAL_TABLET | Freq: Two times a day (BID) | ORAL | Status: DC
  Administered 2018-11-11 – 2018-11-16 (×10): 50 mg via ORAL
  Filled 2018-11-11: qty 2
  Filled 2018-11-11 (×4): qty 1
  Filled 2018-11-11: qty 2
  Filled 2018-11-11 (×4): qty 1

## 2018-11-11 MED ORDER — ACETAMINOPHEN 325 MG PO TABS
650.0000 mg | ORAL_TABLET | Freq: Four times a day (QID) | ORAL | Status: DC | PRN
  Administered 2018-11-14 (×2): 650 mg via ORAL
  Filled 2018-11-11 (×2): qty 2

## 2018-11-11 MED ORDER — APIXABAN 5 MG PO TABS
5.0000 mg | ORAL_TABLET | Freq: Two times a day (BID) | ORAL | Status: DC
  Administered 2018-11-11 – 2018-11-16 (×10): 5 mg via ORAL
  Filled 2018-11-11 (×11): qty 1

## 2018-11-11 MED ORDER — SODIUM CHLORIDE 0.9% FLUSH
3.0000 mL | Freq: Once | INTRAVENOUS | Status: AC
  Administered 2018-11-11: 22:00:00 3 mL via INTRAVENOUS

## 2018-11-11 MED ORDER — PIOGLITAZONE HCL 45 MG PO TABS
45.0000 mg | ORAL_TABLET | Freq: Every day | ORAL | Status: DC
  Administered 2018-11-12: 45 mg via ORAL
  Filled 2018-11-11: qty 1

## 2018-11-11 MED ORDER — VITAMIN D 25 MCG (1000 UNIT) PO TABS
5000.0000 [IU] | ORAL_TABLET | ORAL | Status: DC
  Administered 2018-11-12 – 2018-11-16 (×3): 5000 [IU] via ORAL
  Filled 2018-11-11 (×5): qty 5

## 2018-11-11 MED ORDER — OXYCODONE-ACETAMINOPHEN 5-325 MG PO TABS: 1.0000 | ORAL_TABLET | Freq: Four times a day (QID) | ORAL | Status: DC | PRN

## 2018-11-11 MED ORDER — FLUTICASONE PROPIONATE 50 MCG/ACT NA SUSP: 1.0000 | Freq: Every day | NASAL | Status: DC | PRN

## 2018-11-11 MED ORDER — ATORVASTATIN CALCIUM 80 MG PO TABS
80.0000 mg | ORAL_TABLET | Freq: Every day | ORAL | Status: DC
  Administered 2018-11-11 – 2018-11-15 (×5): 80 mg via ORAL
  Filled 2018-11-11 (×5): qty 1

## 2018-11-11 MED ORDER — FUROSEMIDE 10 MG/ML IJ SOLN
40.0000 mg | Freq: Three times a day (TID) | INTRAMUSCULAR | Status: DC
  Administered 2018-11-11 – 2018-11-12 (×2): 40 mg via INTRAVENOUS
  Filled 2018-11-11 (×2): qty 4

## 2018-11-11 MED ORDER — VITAMIN D 25 MCG (1000 UNIT) PO TABS
10000.0000 [IU] | ORAL_TABLET | ORAL | Status: DC
  Administered 2018-11-13 – 2018-11-15 (×2): 10000 [IU] via ORAL
  Filled 2018-11-11 (×3): qty 10

## 2018-11-11 MED ORDER — SODIUM CHLORIDE 0.9 % IV SOLN: 250.0000 mL | INTRAVENOUS | Status: DC | PRN

## 2018-11-11 MED ORDER — HYDROCODONE-ACETAMINOPHEN 5-325 MG PO TABS: 1.0000 | ORAL_TABLET | ORAL | Status: DC | PRN

## 2018-11-11 MED ORDER — INSULIN ASPART 100 UNIT/ML ~~LOC~~ SOLN
0.0000 [IU] | Freq: Three times a day (TID) | SUBCUTANEOUS | Status: DC
  Administered 2018-11-12: 13:00:00 2 [IU] via SUBCUTANEOUS
  Administered 2018-11-12: 1 [IU] via SUBCUTANEOUS
  Administered 2018-11-12: 2 [IU] via SUBCUTANEOUS
  Administered 2018-11-13: 1 [IU] via SUBCUTANEOUS
  Administered 2018-11-13: 3 [IU] via SUBCUTANEOUS
  Administered 2018-11-13: 2 [IU] via SUBCUTANEOUS
  Administered 2018-11-14: 1 [IU] via SUBCUTANEOUS
  Administered 2018-11-14: 3 [IU] via SUBCUTANEOUS
  Administered 2018-11-14 – 2018-11-15 (×2): 2 [IU] via SUBCUTANEOUS
  Administered 2018-11-15: 1 [IU] via SUBCUTANEOUS
  Administered 2018-11-16: 2 [IU] via SUBCUTANEOUS
  Administered 2018-11-16: 1 [IU] via SUBCUTANEOUS

## 2018-11-11 MED ORDER — ONDANSETRON HCL 4 MG/2ML IJ SOLN: 4.0000 mg | Freq: Four times a day (QID) | INTRAMUSCULAR | Status: DC | PRN

## 2018-11-11 MED ORDER — ONDANSETRON HCL 4 MG PO TABS: 4.0000 mg | ORAL_TABLET | Freq: Four times a day (QID) | ORAL | Status: DC | PRN

## 2018-11-11 MED ORDER — INSULIN ASPART 100 UNIT/ML ~~LOC~~ SOLN
0.0000 [IU] | Freq: Every day | SUBCUTANEOUS | Status: DC
  Administered 2018-11-11: 2 [IU] via SUBCUTANEOUS

## 2018-11-11 MED ORDER — ACETAMINOPHEN 650 MG RE SUPP: 650.0000 mg | Freq: Four times a day (QID) | RECTAL | Status: DC | PRN

## 2018-11-11 MED ORDER — AMIODARONE HCL 200 MG PO TABS: 200.0000 mg | ORAL_TABLET | ORAL | Status: DC

## 2018-11-11 MED ORDER — FUROSEMIDE 10 MG/ML IJ SOLN
40.0000 mg | Freq: Once | INTRAMUSCULAR | Status: AC
  Administered 2018-11-11: 40 mg via INTRAVENOUS
  Filled 2018-11-11: qty 4

## 2018-11-11 MED ORDER — POLYETHYLENE GLYCOL 3350 17 G PO PACK: 17.0000 g | PACK | Freq: Every day | ORAL | Status: DC | PRN

## 2018-11-11 MED ORDER — SODIUM CHLORIDE 0.9% FLUSH: 3.0000 mL | INTRAVENOUS | Status: DC | PRN

## 2018-11-11 MED ORDER — SODIUM CHLORIDE 0.9% FLUSH
3.0000 mL | Freq: Two times a day (BID) | INTRAVENOUS | Status: DC
  Administered 2018-11-11 – 2018-11-16 (×10): 3 mL via INTRAVENOUS

## 2018-11-11 MED ORDER — NATEGLINIDE 120 MG PO TABS
120.0000 mg | ORAL_TABLET | Freq: Two times a day (BID) | ORAL | Status: DC
  Administered 2018-11-12 – 2018-11-16 (×9): 120 mg via ORAL
  Filled 2018-11-11 (×11): qty 1

## 2018-11-11 NOTE — H&P (Signed)
[facility]                                                                                    Patient Demographics  [name], is a 85 y.o. male  CSN: [id number]  MRN: [id number]  DOB - 1931/08/28  Admit Date - 11/11/2018  Outpatient Primary MD for the patient is [name], MD   With History of -  Past Medical History:  Diagnosis Date  . AICD (automatic cardioverter/defibrillator) present    [employer]n PPM/ICD  . Coronary artery disease    [facility]  [facility]  . Diabetes mellitus   . Dysrhythmia   . Endocarditis, valve unspecified    Mitral  . Headache    hx migraines 6-7 x mo  . History of migraine headaches   . History of seasonal allergies   . ICD (implantable cardiac defibrillator) in place   . PAF (paroxysmal atrial fibrillation) ([place])    NO LAA occlusion at the time of surgery  [name] 2018  . Pleural effusion   . PVC's (premature ventricular contractions)   . Ventricular tachycardia, polymorphic ([place])    status post ICD implantation      Past Surgical History:  Procedure Laterality Date  . CARDIAC CATHETERIZATION  04/03/2007   EF 40%  . CARDIAC CATHETERIZATION  02/06/2006   EF 45%. ANTERIOR HYPOKINESIS  . CARDIAC CATHETERIZATION  05/29/2000   EF 50%. MILD ANTERIOR HYPOKINESIS  . CARDIAC CATHETERIZATION  10/25/1999   EF 50%. SEVERE MITRAL REGURGITATION  . CARDIAC CATHETERIZATION  01/06/2003   EF 50%  . CARDIAC DEFIBRILLATOR PLACEMENT    . CATARACT EXTRACTION W/ INTRAOCULAR LENS  IMPLANT, BILATERAL    . CORONARY ARTERY BYPASS GRAFT  2001   2 VESSEL CABG AND MITRAL VALVE REPAIR. HE HAD A LIMA GRAFT TO THE LAD AND RADIAL ARTERY  GRAFT TO THE OBTUSE MARGINAL  OF LEFT CIRCUMFLEX  . ENDARTERECTOMY Right 11/07/2018   Procedure: ENDARTERECTOMY CAROTID RIGHT;  Surgeon: Waynetta Sandy, MD;  Location: [facility];  Service: Vascular;  Laterality: Right;  . EP IMPLANTABLE DEVICE N/A 11/06/2014   Procedure: ICD Generator  Changeout;  Surgeon: Deboraha Sprang, MD;  Location: [place] [facility];  Service: Cardiovascular;  Laterality: N/A;  . FOREIGN BODY REMOVAL Right 06/21/2017   Procedure: FOREIGN BODY REMOVAL ADULT RIGHT RING FINGER;  Surgeon: Leanora Cover, MD;  Location: [place] [facility];  Service: Orthopedics;  Laterality: Right;  . HEMORRHOID SURGERY    . HEMORROIDECTOMY    . INTRAVASCULAR PRESSURE WIRE/FFR STUDY N/A 01/05/2017   Procedure: INTRAVASCULAR PRESSURE WIRE/FFR STUDY;  Surgeon: Sherren Mocha, MD;  Location: [place];  Service: Cardiovascular;  Laterality: N/A;  . KNEE ARTHROSCOPY     RIGHT KNEE  . LEFT HEART CATHETERIZATION WITH CORONARY ANGIOGRAM N/A 07/04/2012   Procedure: LEFT HEART CATHETERIZATION WITH CORONARY ANGIOGRAM;  Surgeon: Sherren Mocha, MD;  Location: [facility] C[facility];  Service: Cardiovascular;  Laterality: N/A;  . MITRAL VALVE REPLACEMENT     No LAA occlusion at the time of surgery  [name] report 2018  . RIGHT/LEFT HEART CATH AND CORONARY/GRAFT ANGIOGRAPHY N/A 01/05/2017   Procedure: RIGHT/LEFT  HEART CATH AND CORONARY/GRAFT ANGIOGRAPHY;  Surgeon: Sherren Mocha, MD;  Location: [place] [facility];  Service: Cardiovascular;  Laterality: N/A;  . TRANSTHORACIC ECHOCARDIOGRAM  04/02/2007   EF 35%    in for   Chief Complaint  Patient presents with  . Shortness of Breath     HPI  [name]  is a 85 y.o. male, with past medical history significant for A. fib, diabetes mellitus type 2, sick sinus syndrome systolic congestive heart failure, ejection fraction 35% status post AICD placement, presenting with 2 days history of increasing shortness of breath, orthopnea and PND's.  He denies any history of chest pains or increase in his leg swelling.  Patient has history of COPD asthma which is not helping patient did not take any of his Lasix as needed lately Emergency room work-up showed a BNP of 1295, sodium 122, chest x-ray showing bilateral pleural effusions.     Review of Systems     In addition to the HPI above,  No Fever-chills, No Headache, No changes with Vision or hearing, No problems swallowing food or Liquids, No Chest pain,No Abdominal pain, No Nausea or Vommitting, Bowel movements are regular, No Blood in stool or Urine, No dysuria, No new skin rashes or bruises, No new joints pains-aches,  No new weakness, tingling, numbness in any extremity, No recent weight gain or loss, No polyuria, polydypsia or polyphagia, No significant Mental Stressors.  A full 10 point Review of Systems was done, except as stated above, all other Review of Systems were negative.   Social History Social History   Tobacco Use  . Smoking status: Former Smoker    Packs/day: 2.00    Years: 16.00    Pack years: 32.00    Types: Cigarettes    Start date: 02/23/1956    Quit date: 01/31/1971    Years since quitting: 47.8  . Smokeless tobacco: Never Used  Substance Use Topics  . Alcohol use: Yes    Alcohol/week: 0.0 standard drinks    Comment: 1-2 a week     Family History Family History  Problem Relation Age of Onset  . Heart disease Father   . Emphysema Father   . Stroke Mother   . Cancer Sister        breast cancer  . Heart disease Paternal Grandmother   . Heart disease Paternal Grandfather   . Diabetes Other        grandmother, aunt, uncle  . Cancer Other        lung cancer, aunt     Prior to Admission medications   Medication Sig Start Date End Date Taking? Authorizing Provider  acetaminophen (TYLENOL) 325 MG tablet Take 2 tablets (650 mg total) by mouth every 6 (six) hours as needed for mild pain (or Fever >/= 101). 03/07/18   [name], MD  amiodarone (PACERONE) 200 MG tablet Take 1 tablet (200 mg total) TWICE daily for one month, then reduce and take 1 tablet (200 mg daily) ONCE daily Patient taking differently: Take 200 mg by mouth See admin instructions. Take 1 tablet (200 mg total) TWICE daily for one month, then reduce and take 1 tablet (200 mg  daily) ONCE daily 10/26/18   Deboraha Sprang, MD  apixaban (ELIQUIS) 5 MG TABS tablet Take 1 tablet (5 mg total) by mouth 2 (two) times daily. 10/23/18   Deboraha Sprang, MD  atorvastatin (LIPITOR) 80 MG tablet Take 80 mg by mouth at bedtime.  07/12/10   [provider]  Cholecalciferol (VITAMIN D-3) 5000 UNITS TABS Take 5,000-10,000 Units by mouth See admin instructions. Take 5000 units by mouth on Sunday, Tuesday, Thursday, and Saturday. Take 10000 units by mouth on Monday, Wednesday, and Friday    [provider]  EPIPEN 2-PAK 0.3 MG/0.3ML SOAJ injection Inject 0.3 mg into the muscle daily as needed for anaphylaxis. USE ONLY IN EMERGENCY 08/12/12   [provider]  fluticasone (FLONASE) 50 MCG/ACT nasal spray Place 1 spray into the nose daily as needed for rhinitis or allergies.    [provider]  furosemide (LASIX) 20 MG tablet Take 1 tablet (20 mg total) by mouth daily as needed for fluid or edema. 03/07/18   [name], MD  Magnesium 250 MG TABS Take 250 mg by mouth daily.     [provider]  metoprolol succinate (TOPROL-XL) 50 MG 24 hr tablet Take 1 tablet (50 mg total) by mouth 2 (two) times daily. Take with or immediately following a meal. Patient taking differently: Take 50 mg by mouth daily. Take with or immediately following a meal. 10/23/18   Deboraha Sprang, MD  Multiple Vitamin (MULTIVITAMIN WITH MINERALS) TABS Take 1 tablet by mouth daily. Centrum Silver    [provider]  Multiple Vitamins-Minerals (PRESERVISION AREDS 2) CAPS Take 1 capsule by mouth 2 (two) times daily.     [provider]  nateglinide (STARLIX) 120 MG tablet Take 120 mg by mouth Twice daily.  07/12/10   [provider]  oxyCODONE-acetaminophen (PERCOCET/ROXICET) 5-325 MG tablet Take 1 tablet by mouth every 6 (six) hours as needed for up to 7 days for moderate pain. 11/08/18 11/15/18  Dessa Phi, DO  pioglitazone (ACTOS) 45 MG tablet Take 45 mg  by mouth daily.      [provider]  polyethylene glycol (MIRALAX / GLYCOLAX) 17 g packet Take 17 g by mouth daily as needed for mild constipation. 11/08/18   Dessa Phi, DO  sitaGLIPtin (JANUVIA) 100 MG tablet Take 100 mg by mouth at bedtime.     [provider]  Tiotropium Bromide-Olodaterol (STIOLTO RESPIMAT) 2.5-2.5 MCG/ACT AERS Inhale 2 puffs into the lungs daily. 04/09/18   Lauraine Rinne, NP    Allergies  Allergen Reactions  . Latex Other (See Comments)    REDNESS AT REACTION SITE.  Marland Kitchen Metformin Diarrhea  . Rivaroxaban Other (See Comments)    Headaches, blurred vision  . Other Other (See Comments)    BEE STINGS   . Sotalol Other (See Comments)    "BLUE TOES"- cut his circulation off  . Warfarin Sodium Other (See Comments)    REACTION: migraine headaches/vision impariment    Physical Exam  Vitals  Blood pressure 135/84, pulse 65, temperature 97.7 F (36.5 C), temperature source Oral, resp. rate 19, height 6\' 2"  (1.88 m), weight 96.2 kg, SpO2 98 %.   General appearance well-developed, well-nourished but looks less than stated age 73 no jaundice or pallor, no facial deviation oral thrush Neck right neck surgical site looks swollen and ecchymotic Chest good air entry bilaterally with bilateral basilar crackles, no wheezing or rhonchi Heart irregularly irregular positive systolic murmur Abdomen soft, nontender, bowel sounds present Extremities trace edema lower extremities Neuro gross nonfocal, patient moving all extremities  Data Review  CBC Recent Labs  Lab 11/04/18 1635 11/04/18 1711 11/07/18 0317 11/08/18 0130 11/11/18 1240  WBC 5.6  --  4.5 7.1 6.9  HGB 14.1 14.6 12.4* 11.8* 12.2*  HCT 41.2 43.0 35.4* 32.9* 33.9*  PLT 124*  --  114* 103* 144*  MCV 103.0*  --  100.9* 99.4 98.8  MCH 35.3*  --  35.3* 35.6* 35.6*  MCHC 34.2  --  35.0 35.9 36.0  RDW 12.9  --  13.1 12.8 12.6  LYMPHSABS 0.6*  --   --   --   --   MONOABS 0.7  --   --   --    --   EOSABS 0.3  --   --   --   --   BASOSABS 0.0  --   --   --   --    ------------------------------------------------------------------------------------------------------------------  Chemistries  Recent Labs  Lab 11/04/18 1635 11/04/18 1711 11/08/18 0130 11/11/18 1240  NA 130* 133* 129* 122*  K 4.6 4.5 4.3 4.4  CL 99 98 101 89*  CO2 23  --  20* 22  GLUCOSE 133* 128* 189* 178*  BUN 12 14 13  6*  CREATININE 1.00 0.90 0.89 0.74  CALCIUM 9.2  --  8.0* 8.4*  AST 27  --   --   --   ALT 23  --   --   --   ALKPHOS 89  --   --   --   BILITOT 2.5*  --   --   --    ------------------------------------------------------------------------------------------------------------------ estimated creatinine clearance is 78.5 mL/min (by C-G formula based on SCr of 0.74 mg/dL). ------------------------------------------------------------------------------------------------------------------ No results for input(s): TSH, T4TOTAL, T3FREE, THYROIDAB in the last 72 hours.  Invalid input(s): FREET3   Coagulation profile Recent Labs  Lab 11/04/18 1635 11/11/18 1240  INR 1.4* 1.4*   ------------------------------------------------------------------------------------------------------------------- No results for input(s): DDIMER in the last 72 hours. -------------------------------------------------------------------------------------------------------------------  Cardiac Enzymes No results for input(s): CKMB, TROPONINI, MYOGLOBIN in the last 168 hours.  Invalid input(s): CK ------------------------------------------------------------------------------------------------------------------ Invalid input(s): POCBNP   ---------------------------------------------------------------------------------------------------------------  Urinalysis    Component Value Date/Time   COLORURINE YELLOW 11/05/2018 0019   COLORURINE YELLOW 11/05/2018 0019   APPEARANCEUR CLEAR 11/05/2018 0019    APPEARANCEUR CLEAR 11/05/2018 0019   LABSPEC 1.018 11/05/2018 0019   LABSPEC 1.018 11/05/2018 0019   PHURINE 6.0 11/05/2018 0019   PHURINE 6.0 11/05/2018 0019   GLUCOSEU NEGATIVE 11/05/2018 0019   GLUCOSEU NEGATIVE 11/05/2018 0019   HGBUR NEGATIVE 11/05/2018 0019   HGBUR NEGATIVE 11/05/2018 0019   BILIRUBINUR NEGATIVE 11/05/2018 0019   BILIRUBINUR NEGATIVE 11/05/2018 0019   KETONESUR NEGATIVE 11/05/2018 0019   KETONESUR NEGATIVE 11/05/2018 0019   PROTEINUR NEGATIVE 11/05/2018 0019   PROTEINUR NEGATIVE 11/05/2018 0019   NITRITE NEGATIVE 11/05/2018 0019   NITRITE NEGATIVE 11/05/2018 0019   LEUKOCYTESUR NEGATIVE 11/05/2018 0019   LEUKOCYTESUR NEGATIVE 11/05/2018 0019    ----------------------------------------------------------------------------------------------------------------    Imaging results:   Ct Angio Head W Or Wo Contrast  Result Date: 11/04/2018 CLINICAL DATA:  Intermittent right eye vision loss EXAM: CT ANGIOGRAPHY HEAD AND NECK TECHNIQUE: Multidetector CT imaging of the head and neck was performed using the standard protocol during bolus administration of intravenous contrast. Multiplanar CT image reconstructions and MIPs were obtained to evaluate the vascular anatomy. Carotid stenosis measurements (when applicable) are obtained utilizing NASCET criteria, using the distal internal carotid diameter as the denominator. CONTRAST:  79mL OMNIPAQUE IOHEXOL 350 MG/ML SOLN COMPARISON:  CTA head neck 12/31/2017 FINDINGS: CTA NECK FINDINGS SKELETON: There is no bony spinal canal stenosis. No lytic or blastic lesion. OTHER NECK: Normal pharynx, larynx and major salivary glands. No cervical lymphadenopathy. Unremarkable thyroid gland. UPPER CHEST: No pneumothorax or pleural effusion. No nodules  or masses. AORTIC ARCH: There is mild calcific atherosclerosis of the aortic arch. There is no aneurysm, dissection or hemodynamically significant stenosis of the visualized portion of the aorta.  Conventional 3 vessel aortic branching pattern. The visualized proximal subclavian arteries are widely patent. RIGHT CAROTID SYSTEM: No dissection, occlusion or aneurysm. There is mixed density atherosclerosis extending into the proximal ICA, resulting in approximately 90% stenosis. This stenosis has worsened from the prior study. LEFT CAROTID SYSTEM: No dissection, occlusion or aneurysm. Mild atherosclerotic calcification at the carotid bifurcation without hemodynamically significant stenosis. VERTEBRAL ARTERIES: Left dominant configuration. Both origins are clearly patent. There is no dissection, occlusion or flow-limiting stenosis to the skull base (V1-V3 segments). CTA HEAD FINDINGS POSTERIOR CIRCULATION: --Vertebral arteries: Normal V4 segments. --Posterior inferior cerebellar arteries (PICA): Patent origins from the vertebral arteries. --Anterior inferior cerebellar arteries (AICA): Patent origins from the basilar artery. --Basilar artery: Normal. --Superior cerebellar arteries: Normal. --Posterior cerebral arteries: Normal. Both originate from the basilar artery. Posterior communicating arteries (p-comm) are diminutive or absent. ANTERIOR CIRCULATION: --Intracranial internal carotid arteries: Atherosclerotic calcification of the internal carotid arteries at the skull base without hemodynamically significant stenosis. --Anterior cerebral arteries (ACA): Normal. Both A1 segments are present. Patent anterior communicating artery (a-comm). --Middle cerebral arteries (MCA): Normal. VENOUS SINUSES: As permitted by contrast timing, patent. ANATOMIC VARIANTS: None Review of the MIP images confirms the above findings. IMPRESSION: 1. No emergent large vessel occlusion or high-grade stenosis of the intracranial arteries. 2. Increased, now approximately 90% stenosis of the proximal right internal carotid artery secondary to mixed density atherosclerosis. 3. Aortic Atherosclerosis (ICD10-I70.0). Electronically Signed    By: Ulyses Jarred M.D.   On: 11/04/2018 23:43   Dg Chest 2 View  Result Date: 11/11/2018 CLINICAL DATA:  SOB. Hx of ventricular tachycardia, PVC's, pleural effusion, paroxysmal afib, ICD, endocarditis, dysrhythmia, diabetes, coronary artery disease, right/left heart cath and coronary/graft angiography(2018), CABG(2001). Former [id number]).SOB EXAM: CHEST - 2 VIEW COMPARISON:  11/04/2018 FINDINGS: LEFT-sided pacemaker overlies normal cardiac silhouette. Midline sternotomy. Several sternotomy wires for fractured. No change from prior. Small LEFT effusion is new from most recent comparison. Trace RIGHT effusion additionally. No overt pulmonary edema. No pneumothorax. IMPRESSION: 1. New bilateral small pleural effusions, LEFT greater than RIGHT. 2. No overt pulmonary edema. Electronically Signed   By: Suzy Bouchard M.D.   On: 11/11/2018 12:41   Ct Head Wo Contrast  Result Date: 11/04/2018 CLINICAL DATA:  Right eye blurred vision. Transient loss of right eye vision 2 days ago. History of atrial fibrillation. EXAM: CT HEAD WITHOUT CONTRAST TECHNIQUE: Contiguous axial images were obtained from the base of the skull through the vertex without intravenous contrast. COMPARISON:  CT head 03/04/2018. FINDINGS: Brain: There is no evidence of acute intracranial hemorrhage, mass lesion, brain edema or extra-axial fluid collection. The ventricles and subarachnoid spaces are appropriately sized for age. Mild chronic small vessel ischemic changes in the periventricular white matter are stable. There is no evidence of acute cortical stroke. Vascular: Intracranial vascular calcifications. No hyperdense vessel identified. Skull: Negative for fracture or focal lesion. Sinuses/Orbits: Minimal ethmoid sinus mucosal thickening and small bilateral mastoid effusions without coalescence. No orbital abnormalities. Other: None. IMPRESSION: Stable head CT without acute findings or explanation for the patient's symptoms. Stable mild  chronic small vessel ischemic changes. Electronically Signed   By: Richardean Sale M.D.   On: 11/04/2018 17:09   Ct Angio Neck W And/or Wo Contrast  Result Date: 11/04/2018 CLINICAL DATA:  Intermittent right eye vision loss EXAM: CT ANGIOGRAPHY  HEAD AND NECK TECHNIQUE: Multidetector CT imaging of the head and neck was performed using the standard protocol during bolus administration of intravenous contrast. Multiplanar CT image reconstructions and MIPs were obtained to evaluate the vascular anatomy. Carotid stenosis measurements (when applicable) are obtained utilizing NASCET criteria, using the distal internal carotid diameter as the denominator. CONTRAST:  60mL OMNIPAQUE IOHEXOL 350 MG/ML SOLN COMPARISON:  CTA head neck 12/31/2017 FINDINGS: CTA NECK FINDINGS SKELETON: There is no bony spinal canal stenosis. No lytic or blastic lesion. OTHER NECK: Normal pharynx, larynx and major salivary glands. No cervical lymphadenopathy. Unremarkable thyroid gland. UPPER CHEST: No pneumothorax or pleural effusion. No nodules or masses. AORTIC ARCH: There is mild calcific atherosclerosis of the aortic arch. There is no aneurysm, dissection or hemodynamically significant stenosis of the visualized portion of the aorta. Conventional 3 vessel aortic branching pattern. The visualized proximal subclavian arteries are widely patent. RIGHT CAROTID SYSTEM: No dissection, occlusion or aneurysm. There is mixed density atherosclerosis extending into the proximal ICA, resulting in approximately 90% stenosis. This stenosis has worsened from the prior study. LEFT CAROTID SYSTEM: No dissection, occlusion or aneurysm. Mild atherosclerotic calcification at the carotid bifurcation without hemodynamically significant stenosis. VERTEBRAL ARTERIES: Left dominant configuration. Both origins are clearly patent. There is no dissection, occlusion or flow-limiting stenosis to the skull base (V1-V3 segments). CTA HEAD FINDINGS POSTERIOR CIRCULATION:  --Vertebral arteries: Normal V4 segments. --Posterior inferior cerebellar arteries (PICA): Patent origins from the vertebral arteries. --Anterior inferior cerebellar arteries (AICA): Patent origins from the basilar artery. --Basilar artery: Normal. --Superior cerebellar arteries: Normal. --Posterior cerebral arteries: Normal. Both originate from the basilar artery. Posterior communicating arteries (p-comm) are diminutive or absent. ANTERIOR CIRCULATION: --Intracranial internal carotid arteries: Atherosclerotic calcification of the internal carotid arteries at the skull base without hemodynamically significant stenosis. --Anterior cerebral arteries (ACA): Normal. Both A1 segments are present. Patent anterior communicating artery (a-comm). --Middle cerebral arteries (MCA): Normal. VENOUS SINUSES: As permitted by contrast timing, patent. ANATOMIC VARIANTS: None Review of the MIP images confirms the above findings. IMPRESSION: 1. No emergent large vessel occlusion or high-grade stenosis of the intracranial arteries. 2. Increased, now approximately 90% stenosis of the proximal right internal carotid artery secondary to mixed density atherosclerosis. 3. Aortic Atherosclerosis (ICD10-I70.0). Electronically Signed   By: Ulyses Jarred M.D.   On: 11/04/2018 23:43   Dg Chest Port 1 View  Result Date: 11/04/2018 CLINICAL DATA:  TIA EXAM: PORTABLE CHEST 1 VIEW COMPARISON:  March 04, 2018 FINDINGS: There is cardiomegaly. Overlying median sternotomy wires. Left-sided pacemaker seen with the tips in the right atrium and right ventricle. Both lungs are clear. No acute osseous abnormality. IMPRESSION: No acute cardiopulmonary process.  Stable cardiomegaly Electronically Signed   By: Prudencio Pair M.D.   On: 11/04/2018 23:21   Vas US Carotid (at [place] Only)  Result Date: 11/06/2018 Carotid Arterial Duplex Study Indications: TIA. Performing Technologist: C BYNUM  Examination Guidelines: A complete evaluation includes  B-mode imaging, spectral Doppler, color Doppler, and power Doppler as needed of all accessible portions of each vessel. Bilateral testing is considered an integral part of a complete examination. Limited examinations for reoccurring indications may be performed as noted.  Right Carotid Findings: +----------+--------+--------+--------+-------------------------------+--------+           PSV cm/sEDV cm/sStenosisPlaque Description             Comments +----------+--------+--------+--------+-------------------------------+--------+ CCA Prox  50      11                                                      +----------+--------+--------+--------+-------------------------------+--------+  CCA Distal43      13                                                      +----------+--------+--------+--------+-------------------------------+--------+ ICA Prox  496     162     80-99%  focal, heterogenous and                                                   calcific                                +----------+--------+--------+--------+-------------------------------+--------+ ICA Mid   72      13                                                      +----------+--------+--------+--------+-------------------------------+--------+ ICA Distal39      17                                                      +----------+--------+--------+--------+-------------------------------+--------+ ECA       69      14                                                      +----------+--------+--------+--------+-------------------------------+--------+ +----------+--------+-------+----------------+-------------------+           PSV cm/sEDV cmsDescribe        Arm Pressure (mmHG) +----------+--------+-------+----------------+-------------------+ [id number]            Multiphasic, WNL                    +----------+--------+-------+----------------+-------------------+  +---------+--------+--+--------+--+---------+ VertebralPSV cm/s39EDV cm/s14Antegrade +---------+--------+--+--------+--+---------+  Left Carotid Findings: +----------+--------+--------+--------+-------------------------------+--------+           PSV cm/sEDV cm/sStenosisPlaque Description             Comments +----------+--------+--------+--------+-------------------------------+--------+ CCA Prox  90      22                                                      +----------+--------+--------+--------+-------------------------------+--------+ CCA Distal62      16              focal, calcific and hyperechoic         +----------+--------+--------+--------+-------------------------------+--------+ ICA Prox  59      20              calcific                                +----------+--------+--------+--------+-------------------------------+--------+  ICA Mid   67      26                                                      +----------+--------+--------+--------+-------------------------------+--------+ ICA Distal52      19                                                      +----------+--------+--------+--------+-------------------------------+--------+ ECA       72      12              focal, hyperechoic and calcific         +----------+--------+--------+--------+-------------------------------+--------+ +----------+--------+--------+----------------+-------------------+           PSV cm/sEDV cm/sDescribe        Arm Pressure (mmHG) +----------+--------+--------+----------------+-------------------+ [id number]             Multiphasic, WNL                    +----------+--------+--------+----------------+-------------------+ +---------+--------+--+--------+--+---------+ VertebralPSV cm/s42EDV cm/s11Antegrade +---------+--------+--+--------+--+---------+  Summary: Right Carotid: Velocities in the right ICA are consistent with a 80-99%                 stenosis. Left Carotid: There is no evidence of stenosis in the left ICA. Vertebrals:  Bilateral vertebral arteries demonstrate antegrade flow. Subclavians: Normal flow hemodynamics were seen in bilateral subclavian              arteries. *See table(s) above for measurements and observations.  Electronically signed by Harold Barban MD on 11/06/2018 at 6:59:05 AM.    Final     My personal review of EKG: A. fib with PVCs at 56 bpm old anterior MI  Assessment & Plan  Systolic congestive heart failure Last echocardiogram on 11/05/2018 Continue with Lasix IV and fluid restriction Check troponin in a.m.  Hyponatremia , Monitor Continue Lasix and fluid restriction  Peripheral vascular disease status post right endarterectomy on 10/8 with swelling and ecchymosis of right neck wound Vascular surgery is following  A. fib/rate controlled Continue with Eliquis  DVT Prophylaxis Eliquis  AM Labs Ordered, also please review Full Orders    Code Status full  Disposition Plan: Home  Time spent in minutes : 42 minutes  Condition GUARDED   @SIGNATURE @

## 2018-11-11 NOTE — ED Notes (Signed)
Patient transported to X-ray 

## 2018-11-11 NOTE — Telephone Encounter (Signed)
Patients wife Fraser Din called to say that the patient had surgery on the 8th and he is getting worse. She said that he is having difficulty breathing and swallowing. She said that he has also this morning started vomiting. Called and advised wife that he needs to go to the ER for evaluation.   She stated that EMS was there now and they were taking him to the hospital.   York Cerise, CMA

## 2018-11-11 NOTE — ED Notes (Signed)
Extensive ecchymosis noted to neck, central and R chest; pt states he noted bruising on last Friday, day after surgery; pt currently taking eliquis

## 2018-11-11 NOTE — Progress Notes (Addendum)
85 year old male status post right carotid endarterectomy for symptomatic ICA stenosis on 11/07/2018 by Dr. Donzetta Matters.  Patient presents to the ER today with complaints of shortness of breath particularly when lying flat.  He has also noticed a lot of ecchymosis to his neck and chest wall and flank.  He is on Eliquis for history of atrial fibrillation.  Neurologic exam remains intact.  No new neurologic changes since discharge.  On exam he does have fair amount of ecchymosis as described above.  There is some fullness in the right neck.  He feels this is unchanged over the last 3 days since starting Eliquis.  His shortness of breath is only when he is lying flat.  In the ED his BNP is 1200+.  Last echo showed an EF of 35 to 40% on 11/05/2018.  Does have a history of heart failure.  Agree with medical admission for potential heart failure management, fluid optimization, and electrolyte replacement.  Vascular surgery will follow.  Will order carotid duplex to evaluate patch and potential underlying hematoma.  I would be hopeful that if there is no more changes we can manage this nonoperatively.  I will let Dr. Donzetta Matters know he is being admitted.  [name], MD [facility] Office: [phone] Pager: [place]

## 2018-11-11 NOTE — ED Triage Notes (Signed)
Pt arrives via EMS from home with SOB. Reports seen here Thursday for TIA symptoms. Had right carotid artery blockage. Discharged Sunday. Today woke up with acute onset SOB. VSS. Spo2 96% on RA. EKG paced rhythm. 150/100. Denies fever, cough.

## 2018-11-11 NOTE — ED Provider Notes (Addendum)
[facility] EMERGENCY DEPARTMENT Provider Note   CSN: [id number] Arrival date & time: 11/11/18  1131     History   Chief Complaint Chief Complaint  Patient presents with  . Shortness of Breath    HPI [name] is a 85 y.o. male.     Patient is an 85 year old male with past medical history of atrial fibrillation, type 2 diabetes, AICD placement, coronary artery disease presenting to the emergency department for shortness of breath.  Patient had a right endarterectomy done here on the eighth of this month.  Patient reports that 2 days after this procedure he began to have shortness of breath with laying flat.  Reports unable to sleep due to being short of breath.  Also reports dyspnea on exertion.  He denies any chest pain, leg swelling, syncope.  Reports he has been trying to use his inhalers for his asthma but that is not helping.  Reports that he does have Lasix to take as needed but has not taken any lately.  Denies any fever, chills, nausea, vomiting     Past Medical History:  Diagnosis Date  . AICD (automatic cardioverter/defibrillator) present    [employer]n PPM/ICD  . Coronary artery disease    [facility]  . Diabetes mellitus   . Dysrhythmia   . Endocarditis, valve unspecified    Mitral  . Headache    hx migraines 6-7 x mo  . History of migraine headaches   . History of seasonal allergies   . ICD (implantable cardiac defibrillator) in place   . PAF (paroxysmal atrial fibrillation) ([place])    NO LAA occlusion at the time of surgery  [name] 2018  . Pleural effusion   . PVC's (premature ventricular contractions)   . Ventricular tachycardia, polymorphic ([place])    status post ICD implantation    Patient Active Problem List   Diagnosis Date Noted  . Amaurosis fugax of right eye 11/05/2018  . Preop cardiovascular exam   . Type 2 diabetes mellitus with complication, without long-term current use of insulin ([place])  11/04/2018  . TIA (transient ischemic attack) 11/04/2018  . CAD (coronary artery disease) 11/04/2018  . Hyponatremia 11/04/2018  . Closed fracture of right iliac crest ([place])   . Syncope 03/04/2018  . Pelvic fracture ([place]) 03/04/2018  . Asthma-COPD overlap syndrome ([place]) 02/22/2018  . Cough 02/22/2018  . Orthostatic presyncope 08/01/2010  . Coronary artery disease with exertional angina ([place]) 08/01/2010  . Ventricular tachycardia, polymorphic ([place])   . ATRIAL FIBRILLATION 06/15/2008  . PREMATURE VENTRICULAR CONTRACTIONS 04/23/2008  . RAYNAUD'S DISEASE 04/23/2008  . Automatic implantable cardioverter-defibrillator in situ 04/23/2008    Past Surgical History:  Procedure Laterality Date  . CARDIAC CATHETERIZATION  04/03/2007   EF 40%  . CARDIAC CATHETERIZATION  02/06/2006   EF 45%. ANTERIOR HYPOKINESIS  . CARDIAC CATHETERIZATION  05/29/2000   EF 50%. MILD ANTERIOR HYPOKINESIS  . CARDIAC CATHETERIZATION  10/25/1999   EF 50%. SEVERE MITRAL REGURGITATION  . CARDIAC CATHETERIZATION  01/06/2003   EF 50%  . CARDIAC DEFIBRILLATOR PLACEMENT    . CATARACT EXTRACTION W/ INTRAOCULAR LENS  IMPLANT, BILATERAL    . CORONARY ARTERY BYPASS GRAFT  2001   2 VESSEL CABG AND MITRAL VALVE REPAIR. HE HAD A LIMA GRAFT TO THE LAD AND RADIAL ARTERY  GRAFT TO THE OBTUSE MARGINAL  OF LEFT CIRCUMFLEX  . ENDARTERECTOMY Right 11/07/2018   Procedure: ENDARTERECTOMY CAROTID RIGHT;  Surgeon: Waynetta Sandy, MD;  Location:  [facility];  Service: Vascular;  Laterality: Right;  . EP IMPLANTABLE DEVICE N/A 11/06/2014   Procedure: ICD Generator Changeout;  Surgeon: Deboraha Sprang, MD;  Location: [facility];  Service: Cardiovascular;  Laterality: N/A;  . FOREIGN BODY REMOVAL Right 06/21/2017   Procedure: FOREIGN BODY REMOVAL ADULT RIGHT RING FINGER;  Surgeon: Leanora Cover, MD;  Location: [facility];  Service: Orthopedics;  Laterality: Right;  . HEMORRHOID SURGERY    . HEMORROIDECTOMY    . INTRAVASCULAR PRESSURE  WIRE/FFR STUDY N/A 01/05/2017   Procedure: INTRAVASCULAR PRESSURE WIRE/FFR STUDY;  Surgeon: Sherren Mocha, MD;  Location: [facility];  Service: Cardiovascular;  Laterality: N/A;  . KNEE ARTHROSCOPY     RIGHT KNEE  . LEFT HEART CATHETERIZATION WITH CORONARY ANGIOGRAM N/A 07/04/2012   Procedure: LEFT HEART CATHETERIZATION WITH CORONARY ANGIOGRAM;  Surgeon: Sherren Mocha, MD;  Location: [facility] CATH LAB;  Service: Cardiovascular;  Laterality: N/A;  . MITRAL VALVE REPLACEMENT     No LAA occlusion at the time of surgery  Karie Soda report 2018  . RIGHT/LEFT HEART CATH AND CORONARY/GRAFT ANGIOGRAPHY N/A 01/05/2017   Procedure: RIGHT/LEFT HEART CATH AND CORONARY/GRAFT ANGIOGRAPHY;  Surgeon: Sherren Mocha, MD;  Location: [facility];  Service: Cardiovascular;  Laterality: N/A;  . TRANSTHORACIC ECHOCARDIOGRAM  04/02/2007   EF 35%        Home Medications    Prior to Admission medications   Medication Sig Start Date End Date Taking? Authorizing Provider  acetaminophen (TYLENOL) 325 MG tablet Take 2 tablets (650 mg total) by mouth every 6 (six) hours as needed for mild pain (or Fever >/= 101). 03/07/18   [name], MD  amiodarone (PACERONE) 200 MG tablet Take 1 tablet (200 mg total) TWICE daily for one month, then reduce and take 1 tablet (200 mg daily) ONCE daily Patient taking differently: Take 200 mg by mouth See admin instructions. Take 1 tablet (200 mg total) TWICE daily for one month, then reduce and take 1 tablet (200 mg daily) ONCE daily 10/26/18   Deboraha Sprang, MD  apixaban (ELIQUIS) 5 MG TABS tablet Take 1 tablet (5 mg total) by mouth 2 (two) times daily. 10/23/18   Deboraha Sprang, MD  atorvastatin (LIPITOR) 80 MG tablet Take 80 mg by mouth at bedtime.  07/12/10   [provider]  Cholecalciferol (VITAMIN D-3) 5000 UNITS TABS Take 5,000-10,000 Units by mouth See admin instructions. Take 5000 units by mouth on Sunday, Tuesday, Thursday, and Saturday. Take 10000 units by  mouth on Monday, Wednesday, and Friday    [provider]  EPIPEN 2-PAK 0.3 MG/0.3ML SOAJ injection Inject 0.3 mg into the muscle daily as needed for anaphylaxis. USE ONLY IN EMERGENCY 08/12/12   [provider]  fluticasone (FLONASE) 50 MCG/ACT nasal spray Place 1 spray into the nose daily as needed for rhinitis or allergies.    [provider]  furosemide (LASIX) 20 MG tablet Take 1 tablet (20 mg total) by mouth daily as needed for fluid or edema. 03/07/18   [name], MD  Magnesium 250 MG TABS Take 250 mg by mouth daily.     [provider]  metoprolol succinate (TOPROL-XL) 50 MG 24 hr tablet Take 1 tablet (50 mg total) by mouth 2 (two) times daily. Take with or immediately following a meal. Patient taking differently: Take 50 mg by mouth daily. Take with or immediately following a meal. 10/23/18   Deboraha Sprang, MD  Multiple Vitamin (MULTIVITAMIN WITH  MINERALS) TABS Take 1 tablet by mouth daily. Centrum Silver    [provider]  Multiple Vitamins-Minerals (PRESERVISION AREDS 2) CAPS Take 1 capsule by mouth 2 (two) times daily.     [provider]  nateglinide (STARLIX) 120 MG tablet Take 120 mg by mouth Twice daily.  07/12/10   [provider]  oxyCODONE-acetaminophen (PERCOCET/ROXICET) 5-325 MG tablet Take 1 tablet by mouth every 6 (six) hours as needed for up to 7 days for moderate pain. 11/08/18 11/15/18  Dessa Phi, DO  pioglitazone (ACTOS) 45 MG tablet Take 45 mg by mouth daily.      [provider]  polyethylene glycol (MIRALAX / GLYCOLAX) 17 g packet Take 17 g by mouth daily as needed for mild constipation. 11/08/18   Dessa Phi, DO  sitaGLIPtin (JANUVIA) 100 MG tablet Take 100 mg by mouth at bedtime.     [provider]  Tiotropium Bromide-Olodaterol (STIOLTO RESPIMAT) 2.5-2.5 MCG/ACT AERS Inhale 2 puffs into the lungs daily. 04/09/18   Lauraine Rinne, NP    Family History Family History  Problem  Relation Age of Onset  . Heart disease Father   . Emphysema Father   . Stroke Mother   . Cancer Sister        breast cancer  . Heart disease Paternal Grandmother   . Heart disease Paternal Grandfather   . Diabetes Other        grandmother, aunt, uncle  . Cancer Other        lung cancer, aunt    Social History Social History   Tobacco Use  . Smoking status: Former Smoker    Packs/day: 2.00    Years: 16.00    Pack years: 32.00    Types: Cigarettes    Start date: 02/23/1956    Quit date: 01/31/1971    Years since quitting: 47.8  . Smokeless tobacco: Never Used  Substance Use Topics  . Alcohol use: Yes    Alcohol/week: 0.0 standard drinks    Comment: 1-2 a week  . Drug use: No     Allergies   Latex, Metformin, Rivaroxaban, Other, Sotalol, and Warfarin sodium   Review of Systems Review of Systems  Constitutional: Negative for appetite change, chills and fever.  HENT: Negative for congestion and sore throat.   Respiratory: Positive for shortness of breath. Negative for cough.   Cardiovascular: Negative for chest pain, palpitations and leg swelling.  Gastrointestinal: Negative for abdominal pain, nausea and vomiting.  Genitourinary: Negative for dysuria.  Musculoskeletal: Negative for back pain.  Skin: Positive for color change. Negative for wound.  Allergic/Immunologic: Negative for immunocompromised state.  Neurological: Negative for dizziness, light-headedness and headaches.  Hematological: Bruises/bleeds easily.     Physical Exam Updated Vital Signs BP 135/84   Pulse 65   Temp 97.7 F (36.5 C) (Oral)   Resp 19   Ht 6\' 2"  (1.88 m)   Wt 96.2 kg   SpO2 98%   BMI 27.22 kg/m   Physical Exam Vitals signs and nursing note reviewed.  Constitutional:      General: He is not in acute distress.    Appearance: Normal appearance. He is well-developed and normal weight. He is not ill-appearing, toxic-appearing or diaphoretic.  HENT:     Head: Normocephalic.      Mouth/Throat:     Mouth: Mucous membranes are moist.  Eyes:     Conjunctiva/sclera: Conjunctivae normal.     Pupils: Pupils are equal, round, and reactive to light.  Neck:   Cardiovascular:     Rate and Rhythm: Normal rate and regular rhythm.  Pulmonary:     Effort: Pulmonary effort is normal.     Breath sounds: Examination of the right-lower field reveals rales. Examination of the left-lower field reveals rales. Rales present.  Chest:     Chest wall: No mass, tenderness or edema.  Abdominal:     General: Bowel sounds are normal.     Palpations: Abdomen is soft.  Musculoskeletal:     Right lower leg: Edema present.     Left lower leg: Edema present.     Comments: Bilateral 1+ pitting edema  Skin:    General: Skin is dry.     Capillary Refill: Capillary refill takes less than 2 seconds.  Neurological:     General: No focal deficit present.     Mental Status: He is alert.  Psychiatric:        Mood and Affect: Mood normal.      ED Treatments / Results  Labs (all labs ordered are listed, but only abnormal results are displayed) Labs Reviewed  BASIC METABOLIC PANEL - Abnormal; Notable for the following components:      Result Value   Sodium 122 (*)    Chloride 89 (*)    Glucose, Bld 178 (*)    BUN 6 (*)    Calcium 8.4 (*)    All other components within normal limits  CBC - Abnormal; Notable for the following components:   RBC 3.43 (*)    Hemoglobin 12.2 (*)    HCT 33.9 (*)    MCH 35.6 (*)    Platelets 144 (*)    All other components within normal limits  BRAIN NATRIURETIC PEPTIDE - Abnormal; Notable for the following components:   B Natriuretic Peptide 1,295.4 (*)    All other components within normal limits  PROTIME-INR - Abnormal; Notable for the following components:   Prothrombin Time 17.3 (*)    INR 1.4 (*)    All other components within normal limits  SARS CORONAVIRUS 2 (TAT 6-24 HRS)  TROPONIN I (HIGH SENSITIVITY)  TROPONIN I (HIGH SENSITIVITY)    EKG  EKG Interpretation  Date/Time:  Monday November 11 2018 12:04:27 EDT Ventricular Rate:  78 PR Interval:    QRS Duration: 124 QT Interval:  424 QTC Calculation: 483 R Axis:   -71 Text Interpretation:  Atrial fibrillation with frequent ventricular-paced complexes Left axis deviation Inferior infarct , age undetermined Anterior infarct , age undetermined VENTRICULAR PACED RHYTHM and afib with conducted beats st changes lateral leads noted on prior ekg of 11/05/18 Confirmed by Pattricia Boss [phone]) on 11/11/2018 12:54:03 PM   Radiology Dg Chest 2 View  Result Date: 11/11/2018 CLINICAL DATA:  SOB. Hx of ventricular tachycardia, PVC's, pleural effusion, paroxysmal afib, ICD, endocarditis, dysrhythmia, diabetes, coronary artery disease, right/left heart cath and coronary/graft angiography(2018), CABG(2001). Former [phone]).SOB EXAM: CHEST - 2 VIEW COMPARISON:  11/04/2018 FINDINGS: LEFT-sided pacemaker overlies normal cardiac silhouette. Midline sternotomy. Several sternotomy wires for fractured. No change from prior. Small LEFT effusion is new from most recent comparison. Trace RIGHT effusion additionally. No overt pulmonary edema. No pneumothorax. IMPRESSION: 1. New bilateral small pleural effusions, LEFT greater than RIGHT. 2. No overt pulmonary edema. Electronically Signed   By: Suzy Bouchard M.D.   On: 11/11/2018 12:41    Procedures Procedures (including critical care time)  Medications Ordered in ED Medications  sodium chloride flush (NS) 0.9 % injection 3 mL (has no administration  in time range)  furosemide (LASIX) injection 40 mg (40 mg Intravenous Given 11/11/18 1458)     Initial Impression / Assessment and Plan / ED Course  I have reviewed the triage vital signs and the nursing notes.  Pertinent labs & imaging results that were available during my care of the patient were reviewed by me and considered in my medical decision making (see chart for details).  Clinical Course as  of Nov 10 1457  Mon Nov 11, 2018  1335 85 y/o with afib, recent endarterectomy here for SOB x2 days, orthopnea and dyspnea on exertion. I paged vascular who will be down to see the patient due to significant swelling and ecchymosis to the site of the endarterectomy. Labs revealing trop of 14 which is consistent with his recent previous troponins this week. He has NO chest pain. His chronic hyponatremia is slightly worse at 122. No elevated white count. Chest xray showing mild bilateral pulmonary effusions, EKG no significant change from previous. BNP significantly elevated to 1295. Will admit patient for CHF exacerbation as well as observation of his neck swelling. See vascular note. Awaiting hospitalist consult at this time    [KM]  1454 Spoke with Dr. Laren Everts who will admit this patient for observation.   [KM]    Clinical Course User Index [KM] Alveria Apley, PA-C       The patient appears reasonably stabilized for admission considering the current resources, flow, and capabilities available in the ED at this time, and I doubt any other [facility] requiring further screening and/or treatment in the ED prior to admission.   Final Clinical Impressions(s) / ED Diagnoses   Final diagnoses:  Acute on chronic systolic congestive heart failure (HCC)  Postoperative hematoma of skin following non-dermatologic procedure    ED Discharge Orders    None    CRITICAL CARE Performed by: Alveria Apley   Total critical care time: 35 minutes  Critical care time was exclusive of separately billable procedures and treating other patients.  Critical care was necessary to treat or prevent imminent or life-threatening deterioration.  Critical care was time spent personally by me on the following activities: development of treatment plan with patient and/or surrogate as well as nursing, discussions with consultants, evaluation of patient's response to treatment, examination of patient, obtaining history  from patient or surrogate, ordering and performing treatments and interventions, ordering and review of laboratory studies, ordering and review of radiographic studies, pulse oximetry and re-evaluation of patient's condition.    Alveria Apley, PA-C 11/11/18 1459    Pattricia Boss, MD 11/11/18 1852    Alveria Apley, PA-C 12/19/18 1201    Pattricia Boss, MD 12/23/18 [phone]

## 2018-11-12 ENCOUNTER — Observation Stay (HOSPITAL_COMMUNITY): Payer: Medicare Other

## 2018-11-12 ENCOUNTER — Encounter (HOSPITAL_COMMUNITY): Payer: Self-pay | Admitting: Internal Medicine

## 2018-11-12 DIAGNOSIS — E871 Hypo-osmolality and hyponatremia: Secondary | ICD-10-CM | POA: Diagnosis not present

## 2018-11-12 DIAGNOSIS — Z8249 Family history of ischemic heart disease and other diseases of the circulatory system: Secondary | ICD-10-CM | POA: Diagnosis not present

## 2018-11-12 DIAGNOSIS — Z889 Allergy status to unspecified drugs, medicaments and biological substances status: Secondary | ICD-10-CM | POA: Insufficient documentation

## 2018-11-12 DIAGNOSIS — Z87891 Personal history of nicotine dependence: Secondary | ICD-10-CM | POA: Diagnosis not present

## 2018-11-12 DIAGNOSIS — I495 Sick sinus syndrome: Secondary | ICD-10-CM | POA: Diagnosis present

## 2018-11-12 DIAGNOSIS — Z7984 Long term (current) use of oral hypoglycemic drugs: Secondary | ICD-10-CM | POA: Diagnosis not present

## 2018-11-12 DIAGNOSIS — I10 Essential (primary) hypertension: Secondary | ICD-10-CM | POA: Diagnosis not present

## 2018-11-12 DIAGNOSIS — Z7901 Long term (current) use of anticoagulants: Secondary | ICD-10-CM | POA: Diagnosis not present

## 2018-11-12 DIAGNOSIS — L7632 Postprocedural hematoma of skin and subcutaneous tissue following other procedure: Secondary | ICD-10-CM | POA: Diagnosis present

## 2018-11-12 DIAGNOSIS — I5043 Acute on chronic combined systolic (congestive) and diastolic (congestive) heart failure: Secondary | ICD-10-CM | POA: Diagnosis present

## 2018-11-12 DIAGNOSIS — T148XXA Other injury of unspecified body region, initial encounter: Secondary | ICD-10-CM

## 2018-11-12 DIAGNOSIS — Z888 Allergy status to other drugs, medicaments and biological substances status: Secondary | ICD-10-CM | POA: Diagnosis not present

## 2018-11-12 DIAGNOSIS — Z95810 Presence of automatic (implantable) cardiac defibrillator: Secondary | ICD-10-CM | POA: Diagnosis present

## 2018-11-12 DIAGNOSIS — I110 Hypertensive heart disease with heart failure: Secondary | ICD-10-CM | POA: Diagnosis present

## 2018-11-12 DIAGNOSIS — J449 Chronic obstructive pulmonary disease, unspecified: Secondary | ICD-10-CM | POA: Diagnosis present

## 2018-11-12 DIAGNOSIS — I7300 Raynaud's syndrome without gangrene: Secondary | ICD-10-CM | POA: Diagnosis present

## 2018-11-12 DIAGNOSIS — Z8774 Personal history of (corrected) congenital malformations of heart and circulatory system: Secondary | ICD-10-CM | POA: Diagnosis not present

## 2018-11-12 DIAGNOSIS — I509 Heart failure, unspecified: Secondary | ICD-10-CM | POA: Insufficient documentation

## 2018-11-12 DIAGNOSIS — I2510 Atherosclerotic heart disease of native coronary artery without angina pectoris: Secondary | ICD-10-CM | POA: Diagnosis present

## 2018-11-12 DIAGNOSIS — E1151 Type 2 diabetes mellitus with diabetic peripheral angiopathy without gangrene: Secondary | ICD-10-CM | POA: Diagnosis present

## 2018-11-12 DIAGNOSIS — I5042 Chronic combined systolic (congestive) and diastolic (congestive) heart failure: Secondary | ICD-10-CM | POA: Insufficient documentation

## 2018-11-12 DIAGNOSIS — Z8673 Personal history of transient ischemic attack (TIA), and cerebral infarction without residual deficits: Secondary | ICD-10-CM | POA: Diagnosis not present

## 2018-11-12 DIAGNOSIS — I5023 Acute on chronic systolic (congestive) heart failure: Secondary | ICD-10-CM | POA: Diagnosis not present

## 2018-11-12 DIAGNOSIS — Z79899 Other long term (current) drug therapy: Secondary | ICD-10-CM | POA: Diagnosis not present

## 2018-11-12 DIAGNOSIS — Z20828 Contact with and (suspected) exposure to other viral communicable diseases: Secondary | ICD-10-CM | POA: Diagnosis present

## 2018-11-12 DIAGNOSIS — Z952 Presence of prosthetic heart valve: Secondary | ICD-10-CM | POA: Diagnosis not present

## 2018-11-12 DIAGNOSIS — Z91040 Latex allergy status: Secondary | ICD-10-CM | POA: Diagnosis not present

## 2018-11-12 DIAGNOSIS — I724 Aneurysm of artery of lower extremity: Secondary | ICD-10-CM | POA: Diagnosis not present

## 2018-11-12 DIAGNOSIS — E1129 Type 2 diabetes mellitus with other diabetic kidney complication: Secondary | ICD-10-CM | POA: Diagnosis not present

## 2018-11-12 DIAGNOSIS — Z951 Presence of aortocoronary bypass graft: Secondary | ICD-10-CM | POA: Diagnosis not present

## 2018-11-12 DIAGNOSIS — I255 Ischemic cardiomyopathy: Secondary | ICD-10-CM | POA: Diagnosis present

## 2018-11-12 DIAGNOSIS — I480 Paroxysmal atrial fibrillation: Secondary | ICD-10-CM | POA: Diagnosis present

## 2018-11-12 LAB — OSMOLALITY, URINE
Osmolality, Ur: 314 mOsm/kg (ref 300–900)
Osmolality, Ur: 562 mOsm/kg (ref 300–900)

## 2018-11-12 LAB — BASIC METABOLIC PANEL
Anion gap: 12 (ref 5–15)
Anion gap: 11 (ref 5–15)
Anion gap: 12 (ref 5–15)
BUN: 6 mg/dL — ABNORMAL LOW (ref 8–23)
BUN: 8 mg/dL (ref 8–23)
BUN: 8 mg/dL (ref 8–23)
CO2: 25 mmol/L (ref 22–32)
CO2: 28 mmol/L (ref 22–32)
CO2: 21 mmol/L — ABNORMAL LOW (ref 22–32)
Calcium: 8.2 mg/dL — ABNORMAL LOW (ref 8.9–10.3)
Calcium: 8.7 mg/dL — ABNORMAL LOW (ref 8.9–10.3)
Calcium: 8.1 mg/dL — ABNORMAL LOW (ref 8.9–10.3)
Chloride: 83 mmol/L — ABNORMAL LOW (ref 98–111)
Chloride: 79 mmol/L — ABNORMAL LOW (ref 98–111)
Chloride: 81 mmol/L — ABNORMAL LOW (ref 98–111)
Creatinine, Ser: 0.79 mg/dL (ref 0.61–1.24)
Creatinine, Ser: 0.87 mg/dL (ref 0.61–1.24)
Creatinine, Ser: 0.75 mg/dL (ref 0.61–1.24)
GFR calc Af Amer: >60 mL/min (ref >60)
GFR calc Af Amer: >60 mL/min (ref >60)
GFR calc Af Amer: >60 mL/min (ref >60)
GFR calc non Af Amer: >60 mL/min (ref >60)
GFR calc non Af Amer: >60 mL/min (ref >60)
GFR calc non Af Amer: >60 mL/min (ref >60)
Glucose, Bld: 161 mg/dL — ABNORMAL HIGH (ref 70–99)
Glucose, Bld: 158 mg/dL — ABNORMAL HIGH (ref 70–99)
Glucose, Bld: 164 mg/dL — ABNORMAL HIGH (ref 70–99)
Potassium: 3.6 mmol/L (ref 3.5–5.1)
Potassium: 3.7 mmol/L (ref 3.5–5.1)
Potassium: 3.7 mmol/L (ref 3.5–5.1)
Sodium: 120 mmol/L — ABNORMAL LOW (ref 135–145)
Sodium: 118 mmol/L — CL (ref 135–145)
Sodium: 114 mmol/L — CL (ref 135–145)

## 2018-11-12 LAB — COMPREHENSIVE METABOLIC PANEL
ALT: 21 U/L (ref 0–44)
AST: 29 U/L (ref 15–41)
Albumin: 3.4 g/dL — ABNORMAL LOW (ref 3.5–5.0)
Alkaline Phosphatase: 91 U/L (ref 38–126)
Anion gap: 13 (ref 5–15)
BUN: 7 mg/dL — ABNORMAL LOW (ref 8–23)
CO2: 25 mmol/L (ref 22–32)
Calcium: 8.1 mg/dL — ABNORMAL LOW (ref 8.9–10.3)
Chloride: 82 mmol/L — ABNORMAL LOW (ref 98–111)
Creatinine, Ser: 0.83 mg/dL (ref 0.61–1.24)
GFR calc Af Amer: >60 mL/min (ref >60)
GFR calc non Af Amer: >60 mL/min (ref >60)
Glucose, Bld: 214 mg/dL — ABNORMAL HIGH (ref 70–99)
Potassium: 3.6 mmol/L (ref 3.5–5.1)
Sodium: 120 mmol/L — ABNORMAL LOW (ref 135–145)
Total Bilirubin: 3.3 mg/dL — ABNORMAL HIGH (ref 0.3–1.2)
Total Protein: 6.6 g/dL (ref 6.5–8.1)

## 2018-11-12 LAB — TSH: TSH: 2.248 u[IU]/mL (ref 0.350–4.500)

## 2018-11-12 LAB — OSMOLALITY: Osmolality: 248 mOsm/kg — CL (ref 275–295)

## 2018-11-12 LAB — SODIUM, URINE, RANDOM
Sodium, Ur: 117 mmol/L
Sodium, Ur: 128 mmol/L

## 2018-11-12 LAB — GLUCOSE, CAPILLARY
Glucose-Capillary: 150 mg/dL — ABNORMAL HIGH (ref 70–99)
Glucose-Capillary: 170 mg/dL — ABNORMAL HIGH (ref 70–99)

## 2018-11-12 LAB — TROPONIN I (HIGH SENSITIVITY): Troponin I (High Sensitivity): 14 ng/L (ref <18)

## 2018-11-12 LAB — CBG MONITORING, ED
Glucose-Capillary: 187 mg/dL — ABNORMAL HIGH (ref 70–99)
Glucose-Capillary: 163 mg/dL — ABNORMAL HIGH (ref 70–99)

## 2018-11-12 LAB — CORTISOL: Cortisol, Plasma: 19 ug/dL

## 2018-11-12 MED ORDER — SODIUM CHLORIDE 0.9 % IV SOLN
250.0000 mL | INTRAVENOUS | Status: DC
  Administered 2018-11-12: 250 mL via INTRAVENOUS

## 2018-11-12 MED ORDER — FUROSEMIDE 10 MG/ML IJ SOLN: 40.0000 mg | Freq: Two times a day (BID) | INTRAMUSCULAR | Status: DC

## 2018-11-12 MED ORDER — FUROSEMIDE 10 MG/ML IJ SOLN
40.0000 mg | Freq: Every day | INTRAMUSCULAR | Status: DC
  Administered 2018-11-13: 40 mg via INTRAVENOUS
  Filled 2018-11-12: qty 4

## 2018-11-12 MED ORDER — AMIODARONE HCL 200 MG PO TABS
200.0000 mg | ORAL_TABLET | Freq: Every day | ORAL | Status: DC
  Administered 2018-11-12 – 2018-11-16 (×5): 200 mg via ORAL
  Filled 2018-11-12 (×5): qty 1

## 2018-11-12 NOTE — Progress Notes (Addendum)
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 Dyspnea and orthopnea for 3 days. Chest xray reveals new bilateral small pleural effusions with left greater than right. HS troponin 14 x3. ekg with Afib with PVC.  Recent echo with EF 35%, global left ventricular hypokinesis, moderately reduced systolic function, severe thickening of mitral valve. BNP 1295. Home meds include low dose lasix prn. Patient has not been taking as did not appreciate any swelling.  -Continue with Lasix IV and fluid restriction -continue amiodarone and BB -monitor intake and output -obtain daily weight -monitor closely   Hyponatremia. Etiology unclear. May be related to volume overload in setting of recent surgery. However, chart review indicates sodium trending down since September. Currently 120. -obtain serum osmolality -obtain urine sodium and urine osmolality -fluid restriction -recheck this afternoon and in am   Peripheral vascular disease/ carotid stenosis.  status post right endarterectomy on 10/8 with swelling and ecchymosis of right neck wound. [name] of neck reveals Right Carotid: Right ICA appears to be patent post endarterectomy, with an area of homogenous echoes; suggestive of possible thrombus versus  restenosis. Incidental findings: there is a heterogenous area of the right lateral neck measuring 5.3 x 4.1 x 6.1cm, suggestive of possible hematoma. The IJV is noncompressible and exhibits internal echoes, suggestive of acute deep vein thrombus -Vascular surgery is following   A. fib/rate controlled. chadvasc score 8. -Continue with Eliquis and BB -monitor  Type 2 Diabetes without long term use of insulin. Serum glucose 161 on admission. Home meds include januvia -continue home meds -SSI for optimal control -monitor  Asthma-COPD overlap syndrome.  Not on home oxygen. No s/sx exacerbation -continue home meds  CAD s/p CABG. No chest pain. ekg and HS trops as noted above.  -continue statin and asa   DVT Prophylaxis Eliquis    Code Status: full Family Communication: patient Disposition Plan: home when ready   Consultants: Donzetta Matters vascular surgery  Procedures: doppler  Antibiotics:   HPI/Subjective: Sitting up in bed watching TV. Reports breathing a "little better". Denies pain/discomfort  Objective: Vitals:   11/12/18 1039 11/12/18 1100  BP: 107/76 113/87  Pulse: 78 (!) 58  Resp:  (!) 22  Temp:    SpO2:  99%    Intake/Output Summary (Last 24 hours) at 11/12/2018 1210 Last data filed at 11/12/2018 0553 Gross per 24 hour  Intake --  Output 3300 ml  Net -3300 ml   Filed Weights   11/11/18 1144 11/11/18 1200  Weight: 96.2 kg 96.2 kg    Exam:  General:  Awake alert somewhat pale Cardiovascular: irregularly irregular 1+pitting edema bilaterally no mgr Respiratory: no increased work of breathing. BS distant with fine crackles left base no wheeze Abdomen: soft +BS no guarding or rebounding Musculoskeletal: joints without swelling/erythema Right neck incision with swelling/erythema/bruising extending to right anterior shoulder.    Data Reviewed: Basic Metabolic Panel: Recent Labs  Lab 11/08/18 0130 11/11/18 1240 11/12/18 0346 11/12/18 0847  NA 129* 122* 120* 120*  K 4.3 4.4 3.6 3.6  CL 101 89* 83* 82*  CO2 20* 22 25 25   GLUCOSE 189* 178* 161* 214*  BUN 13 6* 6* 7*  CREATININE 0.89 0.74 0.79 0.83  CALCIUM 8.0* 8.4* 8.2* 8.1*   Liver Function Tests: Recent Labs  Lab 11/12/18 0847  AST 29  ALT 21  ALKPHOS 91  BILITOT 3.3*  PROT  6.6  ALBUMIN 3.4*   No results for input(s): LIPASE, AMYLASE in the last 168 hours. No results for input(s): AMMONIA in the last 168 hours. CBC: Recent Labs  Lab 11/07/18 0317 11/08/18 0130 11/11/18 1240  WBC 4.5 7.1 6.9  HGB 12.4* 11.8* 12.2*  HCT 35.4* 32.9*  33.9*  MCV 100.9* 99.4 98.8  PLT 114* 103* 144*   Cardiac Enzymes: No results for input(s): CKTOTAL, CKMB, CKMBINDEX, TROPONINI in the last 168 hours. BNP (last 3 results) Recent Labs    03/04/18 0030 11/11/18 1240  BNP 219.8* 1,295.4*    ProBNP (last 3 results) Recent Labs    12/18/17 1506  PROBNP 1,465*    CBG: Recent Labs  Lab 11/07/18 1557 11/07/18 2213 11/08/18 0628 11/11/18 2210 11/12/18 0745  GLUCAP 152* 215* 164* 202* 187*    Recent Results (from the past 240 hour(s))  SARS CORONAVIRUS 2 (TAT 6-24 HRS) Nasopharyngeal Nasopharyngeal Swab     Status: None   Collection Time: 11/04/18 10:12 PM   Specimen: Nasopharyngeal Swab  Result Value Ref Range Status   SARS Coronavirus 2 NEGATIVE NEGATIVE Final    Comment: (NOTE) SARS-CoV-2 target nucleic acids are NOT DETECTED. The SARS-CoV-2 RNA is generally detectable in upper and lower respiratory specimens during the acute phase of infection. Negative results do not preclude SARS-CoV-2 infection, do not rule out co-infections with other pathogens, and should not be used as the sole basis for treatment or other patient management decisions. Negative results must be combined with clinical observations, patient history, and epidemiological information. The expected result is Negative. Fact Sheet for Patients: SugarRoll.be Fact Sheet for Healthcare Providers: https://www.woods-mathews.com/ This test is not yet approved or cleared by the [place] FDA and  has been authorized for detection and/or diagnosis of SARS-CoV-2 by FDA under an Emergency Use Authorization (EUA). This EUA will remain  in effect (meaning this test can be used) for the duration of the COVID-19 declaration under Section 56 4(b)(1) of the Act, 21 U.S.C. section 360bbb-3(b)(1), unless the authorization is terminated or revoked sooner. Performed at [facility], [place] [address] [place],  [place] [address]   MRSA PCR Screening     Status: None   Collection Time: 11/07/18  2:47 AM   Specimen: Nasal Mucosa; Nasopharyngeal  Result Value Ref Range Status   MRSA by PCR NEGATIVE NEGATIVE Final    Comment:        The GeneXpert MRSA Assay (FDA approved for NASAL specimens only), is one component of a comprehensive MRSA colonization surveillance program. It is not intended to diagnose MRSA infection nor to guide or monitor treatment for MRSA infections. Performed at [facility], [place] [address], [place], [place] [address]   SARS CORONAVIRUS 2 (TAT 6-24 HRS) Nasopharyngeal Nasopharyngeal Swab     Status: None   Collection Time: 11/11/18  3:03 PM   Specimen: Nasopharyngeal Swab  Result Value Ref Range Status   SARS Coronavirus 2 NEGATIVE NEGATIVE Final    Comment: (NOTE) SARS-CoV-2 target nucleic acids are NOT DETECTED. The SARS-CoV-2 RNA is generally detectable in upper and lower respiratory specimens during the acute phase of infection. Negative results do not preclude SARS-CoV-2 infection, do not rule out co-infections with other pathogens, and should not be used as the sole basis for treatment or other patient management decisions. Negative results must be combined with clinical observations, patient history, and epidemiological information. The expected result is Negative. Fact Sheet for Patients: SugarRoll.be Fact Sheet for Healthcare Providers:  https://www.woods-mathews.com/ This test is not yet approved or cleared by the [place] and  has been authorized for detection and/or diagnosis of SARS-CoV-2 by FDA under an Emergency Use Authorization (EUA). This EUA will remain  in effect (meaning this test can be used) for the duration of the COVID-19 declaration under Section 56 4(b)(1) of the Act, 21 U.S.C. section 360bbb-3(b)(1), unless the authorization is terminated or revoked sooner. Performed at [facility], [place] [address] [place], [place] [address]      Studies: Dg Chest 2 View  Result Date: 11/11/2018 CLINICAL DATA:  SOB. Hx of ventricular tachycardia, PVC's, pleural effusion, paroxysmal afib, ICD, endocarditis, dysrhythmia, diabetes, coronary artery disease, right/left heart cath and coronary/graft angiography(2018), CABG(2001). Former [phone]).SOB EXAM: CHEST - 2 VIEW COMPARISON:  11/04/2018 FINDINGS: LEFT-sided pacemaker overlies normal cardiac silhouette. Midline sternotomy. Several sternotomy wires for fractured. No change from prior. Small LEFT effusion is new from most recent comparison. Trace RIGHT effusion additionally. No overt pulmonary edema. No pneumothorax. IMPRESSION: 1. New bilateral small pleural effusions, LEFT greater than RIGHT. 2. No overt pulmonary edema. Electronically Signed   By: Suzy Bouchard M.D.   On: 11/11/2018 12:41   Vas US Carotid  Result Date: 11/12/2018 Carotid Arterial Duplex Study Indications:      Post right CEA 11/07/2018 with neck distention and                   discoloration. Risk Factors:     Diabetes, coronary artery disease. Comparison Study: 11/05/2018- 80-99% right ICA stenosis. Performing Technologist: Maudry Mayhew MHA, RDMS, RVT, RDCS  Examination Guidelines: A complete evaluation includes B-mode imaging, spectral Doppler, color Doppler, and power Doppler as needed of all accessible portions of each vessel. Bilateral testing is considered an integral part of a complete examination. Limited examinations for reoccurring indications may be performed as noted.  Right Carotid Findings: +----------+--------+--------+--------+----------------------+--------+           PSV cm/sEDV cm/sStenosisPlaque Description    Comments +----------+--------+--------+--------+----------------------+--------+ CCA Prox  74      12                                             +----------+--------+--------+--------+----------------------+--------+  CCA Distal36      6                                              +----------+--------+--------+--------+----------------------+--------+ ICA Prox  80      25              smooth and homogeneous         +----------+--------+--------+--------+----------------------+--------+ ICA Distal58      14                                             +----------+--------+--------+--------+----------------------+--------+ ECA       81      10                                             +----------+--------+--------+--------+----------------------+--------+  Summary: Right Carotid: Right ICA appears to be patent post endarterectomy, with an area                of homogenous echoes; suggestive of possible thrombus versus                restenosis. Incidental findings: there is a heterogenous area of the right lateral neck measuring 5.3 x 4.1 x 6.1cm, suggestive of possible hematoma. The IJV is noncompressible and exhibits internal echoes, suggestive of acute deep vein thrombus.  *See table(s) above for measurements and observations.    Preliminary     Scheduled Meds:  amiodarone  200 mg Oral See admin instructions   apixaban  5 mg Oral BID   atorvastatin  80 mg Oral QHS   [START ON 11/13/2018] cholecalciferol  10,000 Units Oral Q M,W,F   And   cholecalciferol  5,000 Units Oral Q T,Th,S,Su   furosemide  40 mg Intravenous Q8H   insulin aspart  0-5 Units Subcutaneous QHS   insulin aspart  0-9 Units Subcutaneous TID WC   linagliptin  5 mg Oral Daily   metoprolol succinate  50 mg Oral BID   nateglinide  120 mg Oral BID WC   pioglitazone  45 mg Oral Q breakfast   sodium chloride flush  3 mL Intravenous Q12H   Continuous Infusions:  sodium chloride      Principal Problem:   Acute on chronic combined systolic (congestive) and diastolic (congestive) heart failure (HCC) Active Problems:   Hyponatremia   ATRIAL FIBRILLATION   Asthma-COPD overlap syndrome (HCC)   Type 2 diabetes mellitus  with complication, without long-term current use of insulin (HCC)   CAD (coronary artery disease)   AICD (automatic cardioverter/defibrillator) present    Time spent: 57 minutes    [facility] M NP  [facility]  If 7PM-7AM, please contact night-coverage at [email or web address], password [facility] 11/12/2018, 12:10 PM  LOS: 0 days

## 2018-11-12 NOTE — Progress Notes (Signed)
NA 114. Provider notified.

## 2018-11-12 NOTE — Progress Notes (Signed)
Right carotid artery duplex completed. Refer to "CV Proc" under chart review to view preliminary results.  Preliminary results discussed with Dr. Carlis Abbott.  11/12/2018 8:58 AM Maudry Mayhew, MHA, RVT, RDCS, RDMS

## 2018-11-12 NOTE — ED Notes (Addendum)
ED TO INPATIENT HANDOFF REPORT  ED Nurse Name and Phone #: Evona Westra [phone]  S Name/Age/Gender [name] 85 y.o. male Room/Bed: 056C/056C  Code Status   Code Status: Full Code  Home/SNF/Other Home Patient oriented to: self, place, time and situation Is this baseline? Yes   Triage Complete: Triage complete  Chief Complaint sob  Triage Note Pt arrives via EMS from home with SOB. Reports seen here Thursday for TIA symptoms. Had right carotid artery blockage. Discharged Sunday. Today woke up with acute onset SOB. VSS. Spo2 96% on RA. EKG paced rhythm. 150/100. Denies fever, cough.    Allergies Allergies  Allergen Reactions  . Latex Other (See Comments)    REDNESS AT REACTION SITE.  Marland Kitchen Metformin Diarrhea  . Rivaroxaban Other (See Comments)    Headaches, blurred vision  . Other Other (See Comments)    BEE STINGS   . Sotalol Other (See Comments)    "BLUE TOES"- cut his circulation off  . Warfarin Sodium Other (See Comments)    REACTION: migraine headaches/vision impariment    Level of Care/Admitting Diagnosis ED Disposition    ED Disposition Condition [facility] Area: [place] [100100]  Level of Care: Telemetry Cardiac [103]  I expect the patient will be discharged within 24 hours: No (not a candidate for 5C-Observation unit)  Covid Evaluation: Confirmed COVID Negative  Diagnosis: CHF (congestive heart failure) ([place]:[id number]  Admitting Physician: Laren Everts, [place]  Attending Physician: Laren Everts, ALI Marshal.[place]  PT Class (Do Not Modify): Observation [104]  PT Acc Code (Do Not Modify): Observation [10022]       B Medical/Surgery History Past Medical History:  Diagnosis Date  . Acute on chronic combined systolic (congestive) and diastolic (congestive) heart failure ([place])   . AICD (automatic cardioverter/defibrillator) present    [employer]n PPM/ICD  . Carotid stenosis, right    s/p endarterectomy 11/07/18  . Chronic combined  systolic (congestive) and diastolic (congestive) heart failure ([place])   . Coronary artery disease    [facility]  [facility]  . Diabetes mellitus   . Dysrhythmia   . Endocarditis, valve unspecified    Mitral  . Headache    hx migraines 6-7 x mo  . History of migraine headaches   . History of seasonal allergies   . ICD (implantable cardiac defibrillator) in place   . PAF (paroxysmal atrial fibrillation) ([place])    NO LAA occlusion at the time of surgery  [name] 2018  . Pleural effusion   . PVC's (premature ventricular contractions)   . Ventricular tachycardia, polymorphic ([place])    status post ICD implantation   Past Surgical History:  Procedure Laterality Date  . CARDIAC CATHETERIZATION  04/03/2007   EF 40%  . CARDIAC CATHETERIZATION  02/06/2006   EF 45%. ANTERIOR HYPOKINESIS  . CARDIAC CATHETERIZATION  05/29/2000   EF 50%. MILD ANTERIOR HYPOKINESIS  . CARDIAC CATHETERIZATION  10/25/1999   EF 50%. SEVERE MITRAL REGURGITATION  . CARDIAC CATHETERIZATION  01/06/2003   EF 50%  . CARDIAC DEFIBRILLATOR PLACEMENT    . CATARACT EXTRACTION W/ INTRAOCULAR LENS  IMPLANT, BILATERAL    . CORONARY ARTERY BYPASS GRAFT  2001   2 VESSEL CABG AND MITRAL VALVE REPAIR. HE HAD A LIMA GRAFT TO THE LAD AND RADIAL ARTERY  GRAFT TO THE OBTUSE MARGINAL  OF LEFT CIRCUMFLEX  . ENDARTERECTOMY Right 11/07/2018   Procedure: ENDARTERECTOMY CAROTID RIGHT;  Surgeon: Waynetta Sandy, MD;  Location: [place];  Service: Vascular;  Laterality: Right;  . EP IMPLANTABLE DEVICE N/A 11/06/2014   Procedure: ICD Generator Changeout;  Surgeon: Deboraha Sprang, MD;  Location: [facility];  Service: Cardiovascular;  Laterality: N/A;  . FOREIGN BODY REMOVAL Right 06/21/2017   Procedure: FOREIGN BODY REMOVAL ADULT RIGHT RING FINGER;  Surgeon: Leanora Cover, MD;  Location: [facility];  Service: Orthopedics;  Laterality: Right;  . HEMORRHOID SURGERY    . HEMORROIDECTOMY    . INTRAVASCULAR PRESSURE WIRE/FFR STUDY N/A  01/05/2017   Procedure: INTRAVASCULAR PRESSURE WIRE/FFR STUDY;  Surgeon: Sherren Mocha, MD;  Location: [place];  Service: Cardiovascular;  Laterality: N/A;  . KNEE ARTHROSCOPY     RIGHT KNEE  . LEFT HEART CATHETERIZATION WITH CORONARY ANGIOGRAM N/A 07/04/2012   Procedure: LEFT HEART CATHETERIZATION WITH CORONARY ANGIOGRAM;  Surgeon: Sherren Mocha, MD;  Location: [facility] CATH LAB;  Service: Cardiovascular;  Laterality: N/A;  . MITRAL VALVE REPLACEMENT     No LAA occlusion at the time of surgery  Karie Soda report 2018  . RIGHT/LEFT HEART CATH AND CORONARY/GRAFT ANGIOGRAPHY N/A 01/05/2017   Procedure: RIGHT/LEFT HEART CATH AND CORONARY/GRAFT ANGIOGRAPHY;  Surgeon: Sherren Mocha, MD;  Location: [facility];  Service: Cardiovascular;  Laterality: N/A;  . TRANSTHORACIC ECHOCARDIOGRAM  04/02/2007   EF 35%     A IV Location/Drains/Wounds Patient Lines/Drains/Airways Status   Active Line/Drains/Airways    Name:   Placement date:   Placement time:   Site:   Days:   Peripheral IV 11/11/18 Left Antecubital   11/11/18    -    Antecubital   1   Incision (Closed) 11/07/18 Neck Right   11/07/18    0821     5          Intake/Output Last 24 hours  Intake/Output Summary (Last 24 hours) at 11/12/2018 1259 Last data filed at 11/12/2018 0553 Gross per 24 hour  Intake -  Output 3300 ml  Net -3300 ml    Labs/Imaging Results for orders placed or performed during the hospital encounter of 11/11/18 (from the past 48 hour(s))  Basic metabolic panel     Status: Abnormal   Collection Time: 11/11/18 12:40 PM  Result Value Ref Range   Sodium 122 (L) 135 - 145 mmol/L   Potassium 4.4 3.5 - 5.1 mmol/L   Chloride 89 (L) 98 - 111 mmol/L   CO2 22 22 - 32 mmol/L   Glucose, Bld 178 (H) 70 - 99 mg/dL   BUN 6 (L) 8 - 23 mg/dL   Creatinine, Ser 0.74 0.61 - 1.24 mg/dL   Calcium 8.4 (L) 8.9 - 10.3 mg/dL   GFR calc non Af Amer >60 >60 mL/min   GFR calc Af Amer >60 >60 mL/min   Anion gap 11 5 - 15     Comment: Performed at [facility], [place] [address] [place], [place] [address]  CBC     Status: Abnormal   Collection Time: 11/11/18 12:40 PM  Result Value Ref Range   WBC 6.9 4.0 - 10.5 K/uL   RBC 3.43 (L) 4.22 - 5.81 MIL/uL   Hemoglobin 12.2 (L) 13.0 - 17.0 g/dL   HCT 33.9 (L) 39.0 - 52.0 %   MCV 98.8 80.0 - 100.0 fL   MCH 35.6 (H) 26.0 - 34.0 pg   MCHC 36.0 30.0 - 36.0 g/dL   RDW 12.6 11.5 - 15.5 %   Platelets 144 (L) 150 - 400 K/uL    Comment:  REPEATED TO VERIFY   nRBC 0.0 0.0 - 0.2 %    Comment: Performed at [facility], [place] [address] [place], [place] [address]  Troponin I (High Sensitivity)     Status: None   Collection Time: 11/11/18 12:40 PM  Result Value Ref Range   Troponin I (High Sensitivity) 14 <18 ng/L    Comment: (NOTE) Elevated high sensitivity troponin I (hsTnI) values and significant  changes across serial measurements may suggest ACS but many other  chronic and acute conditions are known to elevate hsTnI results.  Refer to the "Links" section for chest pain algorithms and additional  guidance. Performed at [facility], [place] [address] [place], [place] [address]   Brain natriuretic peptide     Status: Abnormal   Collection Time: 11/11/18 12:40 PM  Result Value Ref Range   B Natriuretic Peptide 1,295.4 (H) 0.0 - 100.0 pg/mL    Comment: Performed at [facility] [address], [place], [place] [address]  Protime-INR     Status: Abnormal   Collection Time: 11/11/18 12:40 PM  Result Value Ref Range   Prothrombin Time 17.3 (H) 11.4 - 15.2 seconds   INR 1.4 (H) 0.8 - 1.2    Comment: (NOTE) INR goal varies based on device and disease states. Performed at [facility], [place] [address] [place], [place] [address]   Troponin I (High Sensitivity)     Status: None   Collection Time: 11/11/18  2:30 PM  Result Value Ref Range   Troponin I (High Sensitivity) 13 <18 ng/L    Comment: (NOTE) Elevated high sensitivity troponin I  (hsTnI) values and significant  changes across serial measurements may suggest ACS but many other  chronic and acute conditions are known to elevate hsTnI results.  Refer to the "Links" section for chest pain algorithms and additional  guidance. Performed at [facility], [place] [address], [place], [place] [address]   SARS CORONAVIRUS 2 (TAT 6-24 HRS) Nasopharyngeal Nasopharyngeal Swab     Status: None   Collection Time: 11/11/18  3:03 PM   Specimen: Nasopharyngeal Swab  Result Value Ref Range   SARS Coronavirus 2 NEGATIVE NEGATIVE    Comment: (NOTE) SARS-CoV-2 target nucleic acids are NOT DETECTED. The SARS-CoV-2 RNA is generally detectable in upper and lower respiratory specimens during the acute phase of infection. Negative results do not preclude SARS-CoV-2 infection, do not rule out co-infections with other pathogens, and should not be used as the sole basis for treatment or other patient management decisions. Negative results must be combined with clinical observations, patient history, and epidemiological information. The expected result is Negative. Fact Sheet for Patients: SugarRoll.be Fact Sheet for Healthcare Providers: https://www.woods-mathews.com/ This test is not yet approved or cleared by the [place] FDA and  has been authorized for detection and/or diagnosis of SARS-CoV-2 by FDA under an Emergency Use Authorization (EUA). This EUA will remain  in effect (meaning this test can be used) for the duration of the COVID-19 declaration under Section 56 4(b)(1) of the Act, 21 U.S.C. section 360bbb-3(b)(1), unless the authorization is terminated or revoked sooner. Performed at [facility], [place] [address] [place], [place] [address]   CBG monitoring, ED     Status: Abnormal   Collection Time: 11/11/18 10:10 PM  Result Value Ref Range   Glucose-Capillary 202 (H) 70 - 99 mg/dL  Basic metabolic panel     Status:  Abnormal   Collection Time:  11/12/18  3:46 AM  Result Value Ref Range   Sodium 120 (L) 135 - 145 mmol/L   Potassium 3.6 3.5 - 5.1 mmol/L   Chloride 83 (L) 98 - 111 mmol/L   CO2 25 22 - 32 mmol/L   Glucose, Bld 161 (H) 70 - 99 mg/dL   BUN 6 (L) 8 - 23 mg/dL   Creatinine, Ser 0.79 0.61 - 1.24 mg/dL   Calcium 8.2 (L) 8.9 - 10.3 mg/dL   GFR calc non Af Amer >60 >60 mL/min   GFR calc Af Amer >60 >60 mL/min   Anion gap 12 5 - 15    Comment: Performed at [facility] [address], [place], [place] [address]  Troponin I (High Sensitivity)     Status: None   Collection Time: 11/12/18  4:46 AM  Result Value Ref Range   Troponin I (High Sensitivity) 14 <18 ng/L    Comment: (NOTE) Elevated high sensitivity troponin I (hsTnI) values and significant  changes across serial measurements may suggest ACS but many other  chronic and acute conditions are known to elevate hsTnI results.  Refer to the "Links" section for chest pain algorithms and additional  guidance. Performed at [facility], [place] [address] [place], [place] [address]   CBG monitoring, ED     Status: Abnormal   Collection Time: 11/12/18  7:45 AM  Result Value Ref Range   Glucose-Capillary 187 (H) 70 - 99 mg/dL  Comprehensive metabolic panel     Status: Abnormal   Collection Time: 11/12/18  8:47 AM  Result Value Ref Range   Sodium 120 (L) 135 - 145 mmol/L   Potassium 3.6 3.5 - 5.1 mmol/L   Chloride 82 (L) 98 - 111 mmol/L   CO2 25 22 - 32 mmol/L   Glucose, Bld 214 (H) 70 - 99 mg/dL   BUN 7 (L) 8 - 23 mg/dL   Creatinine, Ser 0.83 0.61 - 1.24 mg/dL   Calcium 8.1 (L) 8.9 - 10.3 mg/dL   Total Protein 6.6 6.5 - 8.1 g/dL   Albumin 3.4 (L) 3.5 - 5.0 g/dL   AST 29 15 - 41 U/L   ALT 21 0 - 44 U/L   Alkaline Phosphatase 91 38 - 126 U/L   Total Bilirubin 3.3 (H) 0.3 - 1.2 mg/dL   GFR calc non Af Amer >60 >60 mL/min   GFR calc Af Amer >60 >60 mL/min   Anion gap 13 5 - 15    Comment: Performed at [facility], [place] [address], [place], [place] [address]  CBG monitoring, ED     Status: Abnormal   Collection Time: 11/12/18 12:32 PM  Result Value Ref Range   Glucose-Capillary 163 (H) 70 - 99 mg/dL   Dg Chest 2 View  Result Date: 11/11/2018 CLINICAL DATA:  SOB. Hx of ventricular tachycardia, PVC's, pleural effusion, paroxysmal afib, ICD, endocarditis, dysrhythmia, diabetes, coronary artery disease, right/left heart cath and coronary/graft angiography(2018), CABG(2001). Former [phone]).SOB EXAM: CHEST - 2 VIEW COMPARISON:  11/04/2018 FINDINGS: LEFT-sided pacemaker overlies normal cardiac silhouette. Midline sternotomy. Several sternotomy wires for fractured. No change from prior. Small LEFT effusion is new from most recent comparison. Trace RIGHT effusion additionally. No overt pulmonary edema. No pneumothorax. IMPRESSION: 1. New bilateral small pleural effusions, LEFT greater than RIGHT. 2. No overt pulmonary edema. Electronically Signed   By: Suzy Bouchard M.D.   On: 11/11/2018 12:41   Vas US Carotid  Result Date: 11/12/2018 Carotid  Arterial Duplex Study Indications:      Post right CEA 11/07/2018 with neck distention and                   discoloration. Risk Factors:     Diabetes, coronary artery disease. Comparison Study: 11/05/2018- 80-99% right ICA stenosis. Performing Technologist: Maudry Mayhew MHA, RDMS, RVT, RDCS  Examination Guidelines: A complete evaluation includes B-mode imaging, spectral Doppler, color Doppler, and power Doppler as needed of all accessible portions of each vessel. Bilateral testing is considered an integral part of a complete examination. Limited examinations for reoccurring indications may be performed as noted.  Right Carotid Findings: +----------+--------+--------+--------+----------------------+--------+           PSV cm/sEDV cm/sStenosisPlaque Description    Comments +----------+--------+--------+--------+----------------------+--------+ CCA Prox  74      12                                              +----------+--------+--------+--------+----------------------+--------+ CCA Distal36      6                                              +----------+--------+--------+--------+----------------------+--------+ ICA Prox  80      25              smooth and homogeneous         +----------+--------+--------+--------+----------------------+--------+ ICA Distal58      14                                             +----------+--------+--------+--------+----------------------+--------+ ECA       81      10                                             +----------+--------+--------+--------+----------------------+--------+ Summary: Right Carotid: Right ICA appears to be patent post endarterectomy, with an area                of homogenous echoes; suggestive of possible thrombus versus                restenosis. Incidental findings: there is a heterogenous area of the right lateral neck measuring 5.3 x 4.1 x 6.1cm, suggestive of possible hematoma. The IJV is noncompressible and exhibits internal echoes, suggestive of acute deep vein thrombus.  *See table(s) above for measurements and observations.    Preliminary     Pending Labs Unresulted Labs (From admission, onward)    Start     Ordered   11/13/18 0500  Comprehensive metabolic panel  Tomorrow morning,   R     11/12/18 1203   11/13/18 0500  CBC  Tomorrow morning,   R     11/12/18 1247   11/12/18 [id number]  Basic metabolic panel  Once,   STAT     11/12/18 1203   11/12/18 1202  Osmolality  Add-on,   AD     11/12/18 1201   11/12/18 1202  Sodium, urine, random  Once,   STAT  11/12/18 1201   11/12/18 1202  Osmolality, urine  Once,   STAT     11/12/18 1201          Vitals/Pain Today's Vitals   11/12/18 1100 11/12/18 1130 11/12/18 1200 11/12/18 1230  BP: 113/87 (!) 111/92 112/72 124/77  Pulse: (!) 58 (!) 108 63 69  Resp: (!) 22 (!) 22 18 (!) 22  Temp:      TempSrc:      SpO2: 99%  96% 97% 97%  Weight:      Height:      PainSc:        Isolation Precautions No active isolations  Medications Medications  atorvastatin (LIPITOR) tablet 80 mg (80 mg Oral Given 11/11/18 2219)  amiodarone (PACERONE) tablet 200 mg (has no administration in time range)  oxyCODONE-acetaminophen (PERCOCET/ROXICET) 5-325 MG per tablet 1 tablet (has no administration in time range)  acetaminophen (TYLENOL) tablet 650 mg (has no administration in time range)  metoprolol succinate (TOPROL-XL) 24 hr tablet 50 mg (50 mg Oral Given 11/12/18 1039)  nateglinide (STARLIX) tablet 120 mg (120 mg Oral Given 11/12/18 0811)  linagliptin (TRADJENTA) tablet 5 mg (5 mg Oral Given 11/12/18 1040)  polyethylene glycol (MIRALAX / GLYCOLAX) packet 17 g (has no administration in time range)  apixaban (ELIQUIS) tablet 5 mg (5 mg Oral Given 11/12/18 1039)  fluticasone (FLONASE) 50 MCG/ACT nasal spray 1 spray (has no administration in time range)  sodium chloride flush (NS) 0.9 % injection 3 mL (3 mLs Intravenous Given 11/12/18 1042)  sodium chloride flush (NS) 0.9 % injection 3 mL (has no administration in time range)  0.9 %  sodium chloride infusion (has no administration in time range)  acetaminophen (TYLENOL) tablet 650 mg (has no administration in time range)    Or  acetaminophen (TYLENOL) suppository 650 mg (has no administration in time range)  HYDROcodone-acetaminophen (NORCO/VICODIN) 5-325 MG per tablet 1-2 tablet (has no administration in time range)  ondansetron (ZOFRAN) tablet 4 mg (has no administration in time range)    Or  ondansetron (ZOFRAN) injection 4 mg (has no administration in time range)  insulin aspart (novoLOG) injection 0-5 Units (2 Units Subcutaneous Given 11/11/18 2226)  insulin aspart (novoLOG) injection 0-9 Units (2 Units Subcutaneous Given 11/12/18 [id number])  cholecalciferol (VITAMIN D3) tablet 10,000 Units (has no administration in time range)    And  cholecalciferol (VITAMIN D3)  tablet 5,000 Units (5,000 Units Oral Given 11/12/18 1039)  furosemide (LASIX) injection 40 mg (has no administration in time range)  sodium chloride flush (NS) 0.9 % injection 3 mL (3 mLs Intravenous Given 11/11/18 2220)  furosemide (LASIX) injection 40 mg (40 mg Intravenous Given 11/11/18 1458)    Mobility walks Low fall risk   Focused Assessments Cardiac Assessment Handoff:    Lab Results  Component Value Date   CKTOTAL 69 04/03/2007   CKMB 1.0 04/03/2007   TROPONINI <0.03 03/04/2018   No results found for: DDIMER Does the Patient currently have chest pain? No     R Recommendations: See Admitting Provider Note  Report given to: Levada Dy, RN on 3E Additional Notes: paced rhythm on monitor. Pt already on hospital bed.

## 2018-11-12 NOTE — ED Notes (Signed)
Admitting at bedside. Pt frustrated about holding in the ER.

## 2018-11-12 NOTE — Progress Notes (Addendum)
Na trending down.  Large amount of urine put already.  Will decrease lasix dose to once daily.  Check TSH and cortisol.  Get serum osmo, urine osmos, and urine Na in AM.  If not improved in the AM, please call Dr. Marval Regal. No neurologic changes.

## 2018-11-12 NOTE — Progress Notes (Signed)
Call from lab, na 118 and serom osmo 248, paged Dr Eliseo Squires

## 2018-11-12 NOTE — Progress Notes (Signed)
  Progress Note    11/12/2018 1:57 PM * No surgery found *  Subjective: No overnight issues, breathing much better this morning.  Does have some hoarseness  Vitals:   11/12/18 1300 11/12/18 1333  BP: 122/79 117/77  Pulse: 73 71  Resp: 20 16  Temp:  97.7 F (36.5 C)  SpO2: 97% 98%    Physical Exam: Awake alert oriented Cranial nerve II through XII intact Moving all extremities without limitation Right neck hematoma stable and soft  CBC    Component Value Date/Time   WBC 6.9 11/11/2018 1240   RBC 3.43 (L) 11/11/2018 1240   HGB 12.2 (L) 11/11/2018 1240   HGB 13.4 10/23/2018 1318   HCT 33.9 (L) 11/11/2018 1240   HCT 39.3 10/23/2018 1318   PLT 144 (L) 11/11/2018 1240   PLT 130 (L) 10/23/2018 1318   MCV 98.8 11/11/2018 1240   MCV 100 (H) 10/23/2018 1318   MCH 35.6 (H) 11/11/2018 1240   MCHC 36.0 11/11/2018 1240   RDW 12.6 11/11/2018 1240   RDW 12.4 10/23/2018 1318   LYMPHSABS 0.6 (L) 11/04/2018 1635   LYMPHSABS 0.9 12/27/2016 1523   MONOABS 0.7 11/04/2018 1635   EOSABS 0.3 11/04/2018 1635   EOSABS 0.2 12/27/2016 1523   BASOSABS 0.0 11/04/2018 1635   BASOSABS 0.0 12/27/2016 1523    BMET    Component Value Date/Time   NA 120 (L) 11/12/2018 0847   NA 135 10/23/2018 1318   K 3.6 11/12/2018 0847   CL 82 (L) 11/12/2018 0847   CO2 25 11/12/2018 0847   GLUCOSE 214 (H) 11/12/2018 0847   BUN 7 (L) 11/12/2018 0847   BUN 16 10/23/2018 1318   CREATININE 0.83 11/12/2018 0847   CREATININE 1.08 01/04/2016 1630   CALCIUM 8.1 (L) 11/12/2018 0847   GFRNONAA >60 11/12/2018 0847   GFRAA >60 11/12/2018 0847    INR    Component Value Date/Time   INR 1.4 (H) 11/11/2018 1240     Intake/Output Summary (Last 24 hours) at 11/12/2018 1357 Last data filed at 11/12/2018 0553 Gross per 24 hour  Intake -  Output 3300 ml  Net -3300 ml    I reviewed duplex which demonstrates concern for restenosis proximally with hematoma and thrombosis of right IJ.  The patient    Assessment/plan:  85 y.o. male is s/p right CEA.  Now readmitted with shortness of breath elevated BNP.  Hematoma stable.  Does have some hoarseness hopefully will resolve with time.  Okay for Eliquis from vascular standpoint.  I reviewed his images which demonstrate possible thrombosis I think more likely residual stenosis proximally in the common carotid artery does not appear to be flow-limiting.  Moneisha Vosler C. Donzetta Matters, MD [facility] Office: [phone] Pager: [phone]  11/12/2018 1:57 PM

## 2018-11-12 NOTE — ED Notes (Signed)
Lunch trey ordered

## 2018-11-12 NOTE — Progress Notes (Signed)
Wife called concerned for pt confused, pt awake and approprate, neuro check completed and WNL. Called wife back and advised he was ok and wil monitor. Pt said he had just awakened from nap was a little confused but better

## 2018-11-12 NOTE — ED Notes (Signed)
Tele ordered bfast 

## 2018-11-12 NOTE — ED Notes (Signed)
Carotid Ultrasound done at bedside.

## 2018-11-13 LAB — CBC
HCT: 34.5 % — ABNORMAL LOW (ref 39.0–52.0)
Hemoglobin: 12.7 g/dL — ABNORMAL LOW (ref 13.0–17.0)
MCH: 34.8 pg — ABNORMAL HIGH (ref 26.0–34.0)
MCHC: 36.8 g/dL — ABNORMAL HIGH (ref 30.0–36.0)
MCV: 94.5 fL (ref 80.0–100.0)
Platelets: 170 10*3/uL (ref 150–400)
RBC: 3.65 MIL/uL — ABNORMAL LOW (ref 4.22–5.81)
RDW: 12.2 % (ref 11.5–15.5)
WBC: 7 10*3/uL (ref 4.0–10.5)
nRBC: 0 % (ref 0.0–0.2)

## 2018-11-13 LAB — COMPREHENSIVE METABOLIC PANEL
ALT: 22 U/L (ref 0–44)
AST: 27 U/L (ref 15–41)
Albumin: 3.4 g/dL — ABNORMAL LOW (ref 3.5–5.0)
Alkaline Phosphatase: 89 U/L (ref 38–126)
Anion gap: 11 (ref 5–15)
BUN: 9 mg/dL (ref 8–23)
CO2: 24 mmol/L (ref 22–32)
Calcium: 8.5 mg/dL — ABNORMAL LOW (ref 8.9–10.3)
Chloride: 82 mmol/L — ABNORMAL LOW (ref 98–111)
Creatinine, Ser: 0.68 mg/dL (ref 0.61–1.24)
GFR calc Af Amer: >60 mL/min (ref >60)
GFR calc non Af Amer: >60 mL/min (ref >60)
Glucose, Bld: 155 mg/dL — ABNORMAL HIGH (ref 70–99)
Potassium: 4.1 mmol/L (ref 3.5–5.1)
Sodium: 117 mmol/L — CL (ref 135–145)
Total Bilirubin: 3.6 mg/dL — ABNORMAL HIGH (ref 0.3–1.2)
Total Protein: 6.4 g/dL — ABNORMAL LOW (ref 6.5–8.1)

## 2018-11-13 LAB — BASIC METABOLIC PANEL
Anion gap: 11 (ref 5–15)
Anion gap: 15 (ref 5–15)
BUN: 11 mg/dL (ref 8–23)
BUN: 9 mg/dL (ref 8–23)
CO2: 23 mmol/L (ref 22–32)
CO2: 25 mmol/L (ref 22–32)
Calcium: 8.2 mg/dL — ABNORMAL LOW (ref 8.9–10.3)
Calcium: 8.7 mg/dL — ABNORMAL LOW (ref 8.9–10.3)
Chloride: 82 mmol/L — ABNORMAL LOW (ref 98–111)
Chloride: 77 mmol/L — ABNORMAL LOW (ref 98–111)
Creatinine, Ser: 0.79 mg/dL (ref 0.61–1.24)
Creatinine, Ser: 0.84 mg/dL (ref 0.61–1.24)
GFR calc Af Amer: >60 mL/min (ref >60)
GFR calc Af Amer: >60 mL/min (ref >60)
GFR calc non Af Amer: >60 mL/min (ref >60)
GFR calc non Af Amer: >60 mL/min (ref >60)
Glucose, Bld: 180 mg/dL — ABNORMAL HIGH (ref 70–99)
Glucose, Bld: 174 mg/dL — ABNORMAL HIGH (ref 70–99)
Potassium: 3.7 mmol/L (ref 3.5–5.1)
Potassium: 3.4 mmol/L — ABNORMAL LOW (ref 3.5–5.1)
Sodium: 116 mmol/L — CL (ref 135–145)
Sodium: 117 mmol/L — CL (ref 135–145)

## 2018-11-13 LAB — GLUCOSE, CAPILLARY
Glucose-Capillary: 158 mg/dL — ABNORMAL HIGH (ref 70–99)
Glucose-Capillary: 167 mg/dL — ABNORMAL HIGH (ref 70–99)
Glucose-Capillary: 147 mg/dL — ABNORMAL HIGH (ref 70–99)
Glucose-Capillary: 169 mg/dL — ABNORMAL HIGH (ref 70–99)

## 2018-11-13 LAB — OSMOLALITY, URINE: Osmolality, Ur: 389 mOsm/kg (ref 300–900)

## 2018-11-13 LAB — OSMOLALITY: Osmolality: 247 mOsm/kg — CL (ref 275–295)

## 2018-11-13 LAB — SODIUM: Sodium: 115 mmol/L — CL (ref 135–145)

## 2018-11-13 LAB — SODIUM, URINE, RANDOM: Sodium, Ur: 70 mmol/L

## 2018-11-13 MED ORDER — TOLVAPTAN 15 MG PO TABS
15.0000 mg | ORAL_TABLET | ORAL | Status: DC
  Administered 2018-11-13 – 2018-11-14 (×2): 15 mg via ORAL
  Filled 2018-11-13 (×3): qty 1

## 2018-11-13 NOTE — Progress Notes (Signed)
   I have seen and evaluated patient.  He has persistent right neck hematoma tracking down onto his chest and flank.  His neck is soft.  He is swallowing without difficulty.  We will follow peripherally while here.  Benelli Winther C. Donzetta Matters, MD [facility] Office: [phone] Pager: [phone]

## 2018-11-13 NOTE — Discharge Instructions (Addendum)

## 2018-11-13 NOTE — Progress Notes (Signed)
Pt. Confused- can't tell RN his DOB or where he is (A&O x2). Also sodium at a critical low of 115. Provider notified.

## 2018-11-13 NOTE — Plan of Care (Signed)

## 2018-11-13 NOTE — Plan of Care (Signed)
  Problem: Education: Goal: Knowledge of General Education information will improve Description: Including pain rating scale, medication(s)/side effects and non-pharmacologic comfort measures Outcome: Progressing   Problem: Education: Goal: Ability to demonstrate management of disease process will improve Outcome: Progressing   

## 2018-11-13 NOTE — Progress Notes (Signed)
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 Dyspnea and orthopnea for 3 days. Chest xray reveals new bilateral small pleural effusions with left greater than right. HS troponin 14 x3. ekg with Afib with PVC.  Recent echo with EF 35%, global left ventricular hypokinesis, moderately reduced systolic function, severe thickening of mitral valve. BNP 1295. Home meds include low dose lasix prn. Patient has not been taking as did not appreciate any swelling.  - Discontinue Lasix while taking tolvaptan -continue amiodarone and BB -monitor intake and output -obtain daily weight -monitor closely   Hyponatremia.  Appears volume overloaded however lab results indicate SIADH.  Patient had worsening sodium overnight with gentle normal saline hydration. -Nephrology consulted -Tolvaptan given.  Hold Lasix while getting tolvaptan -Monitor BMPs -Fall precautions   Peripheral vascular disease/ carotid stenosis.  status post right endarterectomy on 10/8 with swelling and ecchymosis of right neck wound. [name] of neck reveals Right Carotid: Right ICA appears to be patent post endarterectomy, with an area of homogenous echoes; suggestive of possible thrombus versus  restenosis. Incidental findings: there is a heterogenous area of the right lateral neck measuring 5.3 x 4.1 x 6.1cm, suggestive of possible hematoma. The IJV is noncompressible and exhibits internal echoes, suggestive of acute deep vein thrombus -Vascular surgery to follow peripherally   A. fib/rate controlled. chadvasc score 8. -Continue with Eliquis and BB -monitor  Type 2 Diabetes without long term use of insulin. Serum glucose 161 on admission. Home meds include januvia -continue home meds -SSI for optimal control -On Tradjenta and Starlix -monitor  Asthma-COPD overlap syndrome. Not on  home oxygen. No s/sx exacerbation -continue home meds  CAD s/p CABG. No chest pain. ekg and HS trops as noted above.  -continue statin and asa   DVT Prophylaxis Eliquis    Code Status: full Family Communication: patient.  Discussed with wife at bedside Disposition Plan: home when medically stable   Consultants:  Donzetta Matters vascular surgery  Nephrology  Procedures:  doppler  Antibiotics:  None  HPI/Subjective:  I went to evaluate the patient first thing this morning after being paged regarding a critical lab value with sodium 117.  At that point patient stated he was asymptomatic without any complaints and was AOx3.  Several hours later I was called by his wife to the bedside and on evaluation patient was more dizzy, with slowed mentation and reports that he felt lightheaded on ambulation.  I discussed the case with nephrology at that point.  Otherwise patient denies any chest pain, palpitations, fever, vomiting.  No other complaints.  Objective: Vitals:   11/13/18 1209 11/13/18 1706  BP: 122/77 106/74  Pulse: 68 64  Resp: 17 18  Temp: 97.9 F (36.6 C) 98.2 F (36.8 C)  SpO2: 98% 97%    Intake/Output Summary (Last 24 hours) at 11/13/2018 1932 Last data filed at 11/13/2018 1900 Gross per 24 hour  Intake 2088.72 ml  Output 925 ml  Net 1163.72 ml   Filed Weights   11/11/18 1200 11/12/18 1333 11/13/18 0046  Weight: 96.2 kg 93.5 kg 93.2 kg    Exam:   General: AOx3 with slowed mentation on follow-up questioning and pale  Cardiovascular: S1, S2 auscultated no murmurs.  Respiratory: no increased work of breathing. BS distant with fine crackles left base no wheeze  Abdomen: soft +BS no guarding or rebounding  Musculoskeletal: joints without  swelling/erythema  Right neck incision with swelling/erythema/bruising extending to right anterior shoulder.    Data Reviewed: Basic Metabolic Panel: Recent Labs  Lab 11/12/18 1431 11/12/18 2131 11/13/18 0620  11/13/18 0947 11/13/18 1314  NA 118* 114* 117* 116* 117*  K 3.7 3.7 4.1 3.7 3.4*  CL 79* 81* 82* 82* 77*  CO2 28 21* 24 23 25   GLUCOSE 158* 164* 155* 180* 174*  BUN 8 8 9 11 9   CREATININE 0.87 0.75 0.68 0.79 0.84  CALCIUM 8.7* 8.1* 8.5* 8.2* 8.7*   Liver Function Tests: Recent Labs  Lab 11/12/18 0847 11/13/18 0620  AST 29 27  ALT 21 22  ALKPHOS 91 89  BILITOT 3.3* 3.6*  PROT 6.6 6.4*  ALBUMIN 3.4* 3.4*   No results for input(s): LIPASE, AMYLASE in the last 168 hours. No results for input(s): AMMONIA in the last 168 hours. CBC: Recent Labs  Lab 11/07/18 0317 11/08/18 0130 11/11/18 1240 11/13/18 0620  WBC 4.5 7.1 6.9 7.0  HGB 12.4* 11.8* 12.2* 12.7*  HCT 35.4* 32.9* 33.9* 34.5*  MCV 100.9* 99.4 98.8 94.5  PLT 114* 103* 144* 170   Cardiac Enzymes: No results for input(s): CKTOTAL, CKMB, CKMBINDEX, TROPONINI in the last 168 hours. BNP (last 3 results) Recent Labs    03/04/18 0030 11/11/18 1240  BNP 219.8* 1,295.4*    ProBNP (last 3 results) Recent Labs    12/18/17 1506  PROBNP 1,465*    CBG: Recent Labs  Lab 11/12/18 1614 11/12/18 2210 11/13/18 0616 11/13/18 1205 11/13/18 1703  GLUCAP 150* 170* 158* 167* 147*    Recent Results (from the past 240 hour(s))  SARS CORONAVIRUS 2 (TAT 6-24 HRS) Nasopharyngeal Nasopharyngeal Swab     Status: None   Collection Time: 11/04/18 10:12 PM   Specimen: Nasopharyngeal Swab  Result Value Ref Range Status   SARS Coronavirus 2 NEGATIVE NEGATIVE Final    Comment: (NOTE) SARS-CoV-2 target nucleic acids are NOT DETECTED. The SARS-CoV-2 RNA is generally detectable in upper and lower respiratory specimens during the acute phase of infection. Negative results do not preclude SARS-CoV-2 infection, do not rule out co-infections with other pathogens, and should not be used as the sole basis for treatment or other patient management decisions. Negative results must be combined with clinical observations, patient  history, and epidemiological information. The expected result is Negative. Fact Sheet for Patients: SugarRoll.be Fact Sheet for Healthcare Providers: https://www.woods-mathews.com/ This test is not yet approved or cleared by the [place] FDA and  has been authorized for detection and/or diagnosis of SARS-CoV-2 by FDA under an Emergency Use Authorization (EUA). This EUA will remain  in effect (meaning this test can be used) for the duration of the COVID-19 declaration under Section 56 4(b)(1) of the Act, 21 U.S.C. section 360bbb-3(b)(1), unless the authorization is terminated or revoked sooner. Performed at [facility], [place] [address], [place], [place] [address]   MRSA PCR Screening     Status: None   Collection Time: 11/07/18  2:47 AM   Specimen: Nasal Mucosa; Nasopharyngeal  Result Value Ref Range Status   MRSA by PCR NEGATIVE NEGATIVE Final    Comment:        The GeneXpert MRSA Assay (FDA approved for NASAL specimens only), is one component of a comprehensive MRSA colonization surveillance program. It is not intended to diagnose MRSA infection nor to guide or monitor treatment for MRSA infections. Performed at [facility], [place] [address] [place],  [address]   SARS  CORONAVIRUS 2 (TAT 6-24 HRS) Nasopharyngeal Nasopharyngeal Swab     Status: None   Collection Time: 11/11/18  3:03 PM   Specimen: Nasopharyngeal Swab  Result Value Ref Range Status   SARS Coronavirus 2 NEGATIVE NEGATIVE Final    Comment: (NOTE) SARS-CoV-2 target nucleic acids are NOT DETECTED. The SARS-CoV-2 RNA is generally detectable in upper and lower respiratory specimens during the acute phase of infection. Negative results do not preclude SARS-CoV-2 infection, do not rule out co-infections with other pathogens, and should not be used as the sole basis for treatment or other patient management decisions. Negative results must be combined  with clinical observations, patient history, and epidemiological information. The expected result is Negative. Fact Sheet for Patients: SugarRoll.be Fact Sheet for Healthcare Providers: https://www.woods-mathews.com/ This test is not yet approved or cleared by the [place] FDA and  has been authorized for detection and/or diagnosis of SARS-CoV-2 by FDA under an Emergency Use Authorization (EUA). This EUA will remain  in effect (meaning this test can be used) for the duration of the COVID-19 declaration under Section 56 4(b)(1) of the Act, 21 U.S.C. section 360bbb-3(b)(1), unless the authorization is terminated or revoked sooner. Performed at [facility], [place] [address], [place], [place] [address]      Studies: Vas US Carotid  Result Date: 11/12/2018 Carotid Arterial Duplex Study Indications:      Post right CEA 11/07/2018 with neck distention and                   discoloration. Risk Factors:     Diabetes, coronary artery disease. Comparison Study: 11/05/2018- 80-99% right ICA stenosis. Performing Technologist: Maudry Mayhew MHA, RDMS, RVT, RDCS  Examination Guidelines: A complete evaluation includes B-mode imaging, spectral Doppler, color Doppler, and power Doppler as needed of all accessible portions of each vessel. Bilateral testing is considered an integral part of a complete examination. Limited examinations for reoccurring indications may be performed as noted.  Right Carotid Findings: +----------+--------+--------+--------+----------------------+--------+           PSV cm/sEDV cm/sStenosisPlaque Description    Comments +----------+--------+--------+--------+----------------------+--------+ CCA Prox  74      12                                             +----------+--------+--------+--------+----------------------+--------+ CCA Distal36      6                                               +----------+--------+--------+--------+----------------------+--------+ ICA Prox  80      25              smooth and homogeneous         +----------+--------+--------+--------+----------------------+--------+ ICA Distal58      14                                             +----------+--------+--------+--------+----------------------+--------+ ECA       81      10                                             +----------+--------+--------+--------+----------------------+--------+  Summary: Right Carotid: Right ICA appears to be patent post endarterectomy, with an area                of homogenous echoes; suggestive of possible thrombus versus                restenosis. Incidental findings: there is a heterogenous area of the right lateral neck measuring 5.3 x 4.1 x 6.1cm, suggestive of possible hematoma. The IJV is noncompressible and exhibits internal echoes, suggestive of acute deep vein thrombus.  *See table(s) above for measurements and observations.  Electronically signed by Deitra Mayo MD on 11/12/2018 at 3:32:40 PM.   Final     Scheduled Meds: . amiodarone  200 mg Oral Daily  . apixaban  5 mg Oral BID  . atorvastatin  80 mg Oral QHS  . cholecalciferol  10,000 Units Oral Q M,W,F   And  . cholecalciferol  5,000 Units Oral Q T,Th,S,Su  . insulin aspart  0-5 Units Subcutaneous QHS  . insulin aspart  0-9 Units Subcutaneous TID WC  . linagliptin  5 mg Oral Daily  . metoprolol succinate  50 mg Oral BID  . nateglinide  120 mg Oral BID WC  . sodium chloride flush  3 mL Intravenous Q12H  . tolvaptan  15 mg Oral Q24H   Continuous Infusions: . sodium chloride Stopped (11/13/18 [id number])    Principal Problem:   Acute on chronic combined systolic (congestive) and diastolic (congestive) heart failure (HCC) Active Problems:   ATRIAL FIBRILLATION   Asthma-COPD overlap syndrome (HCC)   Type 2 diabetes mellitus with complication, without long-term current use of insulin (HCC)    CAD (coronary artery disease)   Hyponatremia   AICD (automatic cardioverter/defibrillator) present    Time spent: 58 minutes    Harold Hedge NP  [facility]  If 7PM-7AM, please contact night-coverage at [email or web address], password [facility] 11/13/2018, 7:32 PM  LOS: 1 day

## 2018-11-13 NOTE — Progress Notes (Signed)
Provider notified of low lab values- sodium and osmolality.

## 2018-11-13 NOTE — Progress Notes (Signed)
Pt wife called and had questions for the MD that the RN could not answer, she will speak with MD tomorrow.

## 2018-11-13 NOTE — Progress Notes (Signed)
Critical NA level called to on call MD Baltazar Najjar.

## 2018-11-13 NOTE — Progress Notes (Signed)
RN paged Na level of 115. This has decreased. NP reviewed notes. Per nephrology, may need to transfer pt to ICU for hypertonic saline if Na trending down. Called nephro on call. Discussed case. Pt received and was started on Tolvaptan today. Per chart, pt has had 800cc UO since. Nepro wanted strict I&Os, recheck of creatinine, and urine osmolality. These were ordered. Pt on serial BMPs. Dr. Augustin Coupe put in further orders as well.  KJKG, NP [facility]

## 2018-11-13 NOTE — Consult Note (Signed)
Reason for Consult: Hyponatremia Referring Physician:  Neysa Bonito, MD  [name] is an 85 y.o. male.  HPI: [name] has an extensive PMH significant for CAD s/p CABG, ICMP (EF 35%), polymorphic VT s/p ICD, s/p MV repair, PFO closure,  RCEA 11/07/18, DM, and HTN who presented to [facility] ED with 2 day history of SOB, orthopnea, and PND.  Workup in the ED revealed BNP of 1295, CXR with new bilateral pleural effusions, and serum sodium of 122.  He was given IV lasix with marked diuresis of over 3 liters, however his serum sodium continued to fall.  His lasix was decreased yesterday and given IVF's last night with another drop in his sodium level.  We were consulted to further evaluate and help manage his hyponatremia.  The trend in serum sodium is seen below.  His urine osm was 314 and urine Na 117, serum osmolality 248 on 11/12/18.  He also had a normal cortisol and TSH.  He also reported N/V the day prior to admission.  No HCTZ or SSRI's.   Trend in Creatinine: Sodium  Date/Time Value Ref Range Status  11/13/2018 09:47 AM 116 (LL) 135 - 145 mmol/L Final  11/13/2018 06:20 AM 117 (LL) 135 - 145 mmol/L Final  11/12/2018 09:31 PM 114 (LL) 135 - 145 mmol/L Final  11/12/2018 02:31 PM 118 (LL) 135 - 145 mmol/L Final  11/12/2018 08:47 AM 120 (L) 135 - 145 mmol/L Final  11/12/2018 03:46 AM 120 (L) 135 - 145 mmol/L Final  11/11/2018 12:40 PM 122 (L) 135 - 145 mmol/L Final  11/08/2018 01:30 AM 129 (L) 135 - 145 mmol/L Final  11/04/2018 05:11 PM 133 (L) 135 - 145 mmol/L Final  11/04/2018 04:35 PM 130 (L) 135 - 145 mmol/L Final  10/23/2018 01:18 PM 135 134 - 144 mmol/L Final  03/07/2018 01:47 AM 133 (L) 135 - 145 mmol/L Final  03/05/2018 02:48 AM 132 (L) 135 - 145 mmol/L Final  03/04/2018 08:34 AM 133 (L) 135 - 145 mmol/L Final  03/04/2018 12:30 AM 133 (L) 135 - 145 mmol/L Final  12/18/2017 03:06 PM 136 134 - 144 mmol/L Final  06/20/2017 01:44 PM 136 135 - 145 mmol/L Final  12/27/2016 03:23 PM 133 (L)  134 - 144 mmol/L Final  11/24/2016 03:43 PM 138 134 - 144 mmol/L Final  01/04/2016 04:30 PM 137 135 - 146 mmol/L Final  08/25/2015 10:15 AM 134 (L) 135 - 146 mmol/L Final  10/30/2014 10:36 AM 138 135 - 145 mEq/L Final  07/08/2014 02:40 PM 133 (L) 135 - 145 mEq/L Final  01/19/2014 09:52 AM 135 135 - 145 mEq/L Final  07/03/2012 12:19 PM 139 135 - 145 mEq/L Final  12/08/2011 10:57 AM 137 135 - 145 mEq/L Final  12/28/2009 12:00 AM 139 135 - 145 meq/L Final  07/19/2009 12:00 AM 140 135 - 145 meq/L Final  06/20/2008 05:06 AM 136 135 - 145 mEq/L Final  06/19/2008 05:48 AM 137 135 - 145 mEq/L Final  06/18/2008 06:15 AM 136 135 - 145 mEq/L Final  06/17/2008 09:24 AM 140 135 - 145 mEq/L Final  06/15/2008 03:14 PM 139 135 - 145 meq/L Final  04/04/2007 04:45 AM 137  Final  04/01/2007 12:15 PM 132 (L)  Final  03/14/2007 11:24 AM 136  Final    Creatinine, Ser  Date/Time Value Ref Range Status  11/13/2018 09:47 AM 0.79 0.61 - 1.24 mg/dL Final  11/13/2018 06:20 AM 0.68 0.61 - 1.24 mg/dL Final  11/12/2018 09:31 PM 0.75 0.61 -  1.24 mg/dL Final  11/12/2018 02:31 PM 0.87 0.61 - 1.24 mg/dL Final  11/12/2018 08:47 AM 0.83 0.61 - 1.24 mg/dL Final  11/12/2018 03:46 AM 0.79 0.61 - 1.24 mg/dL Final  11/11/2018 12:40 PM 0.74 0.61 - 1.24 mg/dL Final  11/08/2018 01:30 AM 0.89 0.61 - 1.24 mg/dL Final  11/04/2018 05:11 PM 0.90 0.61 - 1.24 mg/dL Final  11/04/2018 04:35 PM 1.00 0.61 - 1.24 mg/dL Final  10/23/2018 01:18 PM 1.06 0.76 - 1.27 mg/dL Final  03/07/2018 01:47 AM 0.88 0.61 - 1.24 mg/dL Final  03/05/2018 02:48 AM 0.81 0.61 - 1.24 mg/dL Final  03/04/2018 08:34 AM 0.95 0.61 - 1.24 mg/dL Final  03/04/2018 12:30 AM 0.93 0.61 - 1.24 mg/dL Final  12/18/2017 03:06 PM 0.86 0.76 - 1.27 mg/dL Final  06/20/2017 01:44 PM 1.00 0.61 - 1.24 mg/dL Final  12/27/2016 03:23 PM 1.13 0.76 - 1.27 mg/dL Final  11/24/2016 03:43 PM 1.04 0.76 - 1.27 mg/dL Final  10/30/2014 10:36 AM 0.99 0.40 - 1.50 mg/dL Final  07/08/2014  02:40 PM 1.06 0.40 - 1.50 mg/dL Final  01/19/2014 09:52 AM 1.0 0.4 - 1.5 mg/dL Final  07/03/2012 12:19 PM 1.0 0.4 - 1.5 mg/dL Final  12/08/2011 10:57 AM 1.1 0.4 - 1.5 mg/dL Final  12/28/2009 12:00 AM 1.1 0.4 - 1.5 mg/dL Final  07/19/2009 12:00 AM 0.9 0.4 - 1.5 mg/dL Final  06/20/2008 05:06 AM 0.92 0.4 - 1.5 mg/dL Final  06/19/2008 05:48 AM 1.00 0.4 - 1.5 mg/dL Final  06/18/2008 06:15 AM 0.82 0.4 - 1.5 mg/dL Final  06/17/2008 09:24 AM 0.90 0.4 - 1.5 mg/dL Final  06/15/2008 03:14 PM 0.8 0.4 - 1.5 mg/dL Final  04/04/2007 04:45 AM 1.02  Final  04/01/2007 12:15 PM 0.98  Final  03/14/2007 11:24 AM 0.95  Final   PMH:   Past Medical History:  Diagnosis Date  . Acute on chronic combined systolic (congestive) and diastolic (congestive) heart failure ([place])   . AICD (automatic cardioverter/defibrillator) present    Medtronicn PPM/ICD  . Carotid stenosis, right    s/p endarterectomy 11/07/18  . Chronic combined systolic (congestive) and diastolic (congestive) heart failure ([place])   . Coronary artery disease    [facility]  . Diabetes mellitus   . Dysrhythmia   . Endocarditis, valve unspecified    Mitral  . Headache    hx migraines 6-7 x mo  . History of migraine headaches   . History of seasonal allergies   . ICD (implantable cardiac defibrillator) in place   . PAF (paroxysmal atrial fibrillation) ([place])    NO LAA occlusion at the time of surgery  [name] 2018  . Pleural effusion   . PVC's (premature ventricular contractions)   . Ventricular tachycardia, polymorphic ([facility])    status post ICD implantation    PSH:   Past Surgical History:  Procedure Laterality Date  . CARDIAC CATHETERIZATION  04/03/2007   EF 40%  . CARDIAC CATHETERIZATION  02/06/2006   EF 45%. ANTERIOR HYPOKINESIS  . CARDIAC CATHETERIZATION  05/29/2000   EF 50%. MILD ANTERIOR HYPOKINESIS  . CARDIAC CATHETERIZATION  10/25/1999   EF 50%. SEVERE MITRAL REGURGITATION  . CARDIAC CATHETERIZATION   01/06/2003   EF 50%  . CARDIAC DEFIBRILLATOR PLACEMENT    . CATARACT EXTRACTION W/ INTRAOCULAR LENS  IMPLANT, BILATERAL    . CORONARY ARTERY BYPASS GRAFT  2001   2 VESSEL CABG AND MITRAL VALVE REPAIR. HE HAD A LIMA GRAFT TO THE LAD AND RADIAL ARTERY  GRAFT TO  THE OBTUSE MARGINAL  OF LEFT CIRCUMFLEX  . ENDARTERECTOMY Right 11/07/2018   Procedure: ENDARTERECTOMY CAROTID RIGHT;  Surgeon: Waynetta Sandy, MD;  Location: [facility];  Service: Vascular;  Laterality: Right;  . EP IMPLANTABLE DEVICE N/A 11/06/2014   Procedure: ICD Generator Changeout;  Surgeon: Deboraha Sprang, MD;  Location: [place] [facility];  Service: Cardiovascular;  Laterality: N/A;  . FOREIGN BODY REMOVAL Right 06/21/2017   Procedure: FOREIGN BODY REMOVAL ADULT RIGHT RING FINGER;  Surgeon: Leanora Cover, MD;  Location: [place];  Service: Orthopedics;  Laterality: Right;  . HEMORRHOID SURGERY    . HEMORROIDECTOMY    . INTRAVASCULAR PRESSURE WIRE/FFR STUDY N/A 01/05/2017   Procedure: INTRAVASCULAR PRESSURE WIRE/FFR STUDY;  Surgeon: Sherren Mocha, MD;  Location: [place] [facility];  Service: Cardiovascular;  Laterality: N/A;  . KNEE ARTHROSCOPY     RIGHT KNEE  . LEFT HEART CATHETERIZATION WITH CORONARY ANGIOGRAM N/A 07/04/2012   Procedure: LEFT HEART CATHETERIZATION WITH CORONARY ANGIOGRAM;  Surgeon: Sherren Mocha, MD;  Location: [facility] CATH LAB;  Service: Cardiovascular;  Laterality: N/A;  . MITRAL VALVE REPLACEMENT     No LAA occlusion at the time of surgery  Karie Soda report 2018  . RIGHT/LEFT HEART CATH AND CORONARY/GRAFT ANGIOGRAPHY N/A 01/05/2017   Procedure: RIGHT/LEFT HEART CATH AND CORONARY/GRAFT ANGIOGRAPHY;  Surgeon: Sherren Mocha, MD;  Location: [place] [facility];  Service: Cardiovascular;  Laterality: N/A;  . TRANSTHORACIC ECHOCARDIOGRAM  04/02/2007   EF 35%    Allergies:  Allergies  Allergen Reactions  . Latex Other (See Comments)    REDNESS AT REACTION SITE.  Marland Kitchen Metformin Diarrhea  . Rivaroxaban Other (See  Comments)    Headaches, blurred vision  . Other Other (See Comments)    BEE STINGS   . Sotalol Other (See Comments)    "BLUE TOES"- cut his circulation off  . Warfarin Sodium Other (See Comments)    REACTION: migraine headaches/vision impariment    Medications:   Prior to Admission medications   Medication Sig Start Date End Date Taking? Authorizing Provider  amiodarone (PACERONE) 200 MG tablet Take 1 tablet (200 mg total) TWICE daily for one month, then reduce and take 1 tablet (200 mg daily) ONCE daily Patient taking differently: Take 200 mg by mouth See admin instructions. Take 1 tablet (200 mg total) TWICE daily for one month, then reduce and take 1 tablet (200 mg daily) ONCE daily 10/26/18  Yes Deboraha Sprang, MD  apixaban (ELIQUIS) 5 MG TABS tablet Take 1 tablet (5 mg total) by mouth 2 (two) times daily. 10/23/18  Yes Deboraha Sprang, MD  atorvastatin (LIPITOR) 80 MG tablet Take 80 mg by mouth at bedtime.  07/12/10  Yes [provider]  Cholecalciferol (VITAMIN D-3) 5000 UNITS TABS Take 5,000-10,000 Units by mouth See admin instructions. Take 5000 units by mouth on Sunday, Tuesday, Thursday, and Saturday. Take 10000 units by mouth on Monday, Wednesday, and Friday   Yes [provider]  EPIPEN 2-PAK 0.3 MG/0.3ML SOAJ injection Inject 0.3 mg into the muscle daily as needed for anaphylaxis. USE ONLY IN EMERGENCY 08/12/12  Yes [provider]  fluticasone (FLONASE) 50 MCG/ACT nasal spray Place 1 spray into the nose daily as needed for rhinitis or allergies.   Yes [provider]  furosemide (LASIX) 20 MG tablet Take 1 tablet (20 mg total) by mouth daily as needed for fluid or edema. 03/07/18  Yes [name], MD  ibuprofen (ADVIL) 200 MG tablet Take 400 mg  by mouth every 6 (six) hours as needed for headache.   Yes [provider]  Magnesium 250 MG TABS Take 250 mg by mouth daily.    Yes [provider]  metoprolol succinate (TOPROL-XL)  50 MG 24 hr tablet Take 1 tablet (50 mg total) by mouth 2 (two) times daily. Take with or immediately following a meal. Patient taking differently: Take 50 mg by mouth daily. Take with or immediately following a meal. 10/23/18  Yes Deboraha Sprang, MD  Multiple Vitamin (MULTIVITAMIN WITH MINERALS) TABS Take 1 tablet by mouth daily. Centrum Silver   Yes [provider]  Multiple Vitamins-Minerals (PRESERVISION AREDS 2) CAPS Take 1 capsule by mouth 2 (two) times daily.    Yes [provider]  nateglinide (STARLIX) 120 MG tablet Take 120 mg by mouth Twice daily.  07/12/10  Yes [provider]  pioglitazone (ACTOS) 45 MG tablet Take 45 mg by mouth daily.     Yes [provider]  polyethylene glycol (MIRALAX / GLYCOLAX) 17 g packet Take 17 g by mouth daily as needed for mild constipation. 11/08/18  Yes Dessa Phi, DO  sitaGLIPtin (JANUVIA) 100 MG tablet Take 100 mg by mouth at bedtime.    Yes [provider]  Tiotropium Bromide-Olodaterol (STIOLTO RESPIMAT) 2.5-2.5 MCG/ACT AERS Inhale 2 puffs into the lungs daily. 04/09/18  Yes Lauraine Rinne, NP  acetaminophen (TYLENOL) 325 MG tablet Take 2 tablets (650 mg total) by mouth every 6 (six) hours as needed for mild pain (or Fever >/= 101). Patient not taking: Reported on 11/11/2018 03/07/18   [name], MD    Discontinued Meds:   Medications Discontinued During This Encounter  Medication Reason  . oxyCODONE-acetaminophen (PERCOCET/ROXICET) 5-325 MG tablet Patient Preference  . Vitamin D-3 TABS 5,000-10,000 Units   . furosemide (LASIX) injection 40 mg   . pioglitazone (ACTOS) tablet 45 mg   . acetaminophen (TYLENOL) tablet [id number] mg Duplicate  . acetaminophen (TYLENOL) suppository [id number] mg Duplicate  . amiodarone (PACERONE) tablet 200 mg Change in therapy  . furosemide (LASIX) injection 40 mg   . 0.9 %  sodium chloride infusion     Social History:  reports that he quit smoking about 47 years ago. His  smoking use included cigarettes. He started smoking about 62 years ago. He has a 32.00 pack-year smoking history. He has never used smokeless tobacco. He reports current alcohol use. He reports that he does not use drugs.  Family History:   Family History  Problem Relation Age of Onset  . Heart disease Father   . Emphysema Father   . Stroke Mother   . Cancer Sister        breast cancer  . Heart disease Paternal Grandmother   . Heart disease Paternal Grandfather   . Diabetes Other        grandmother, aunt, uncle  . Cancer Other        lung cancer, aunt    Pertinent items are noted in HPI. currently without shortness of breath, nausea, or vomiting.  Blood pressure 122/77, pulse 68, temperature 97.9 F (36.6 C), temperature source Oral, resp. rate 17, height 6\' 2"  (1.88 m), weight 93.2 kg, SpO2 98 %. General appearance: alert, cooperative and no distress Head: Normocephalic, without obvious abnormality, atraumatic Neck: significant swelling from hematoma of neck with ecchymosis down to his sternum Resp: rales bibasilar Cardio: regular rate and rhythm, S1, S2 normal, no murmur, click, rub or gallop GI: soft, non-tender; bowel sounds normal;  no masses,  no organomegaly Extremities: extremities normal, atraumatic, no cyanosis or edema  Labs: Basic Metabolic Panel: Recent Labs  Lab 11/11/18 1240 11/12/18 0346 11/12/18 0847 11/12/18 1431 11/12/18 2131 11/13/18 0620 11/13/18 0947  NA 122* 120* 120* 118* 114* 117* 116*  K 4.4 3.6 3.6 3.7 3.7 4.1 3.7  CL 89* 83* 82* 79* 81* 82* 82*  CO2 22 25 25 28  21* 24 23  GLUCOSE 178* 161* 214* 158* 164* 155* 180*  BUN 6* 6* 7* 8 8 9 11   CREATININE 0.74 0.79 0.83 0.87 0.75 0.68 0.79  ALBUMIN  --   --  3.4*  --   --  3.4*  --   CALCIUM 8.4* 8.2* 8.1* 8.7* 8.1* 8.5* 8.2*   Liver Function Tests: Recent Labs  Lab 11/12/18 0847 11/13/18 0620  AST 29 27  ALT 21 22  ALKPHOS 91 89  BILITOT 3.3* 3.6*  PROT 6.6 6.4*  ALBUMIN 3.4* 3.4*    No results for input(s): LIPASE, AMYLASE in the last 168 hours. No results for input(s): AMMONIA in the last 168 hours. CBC: Recent Labs  Lab 11/07/18 0317 11/08/18 0130 11/11/18 1240 11/13/18 0620  WBC 4.5 7.1 6.9 7.0  HGB 12.4* 11.8* 12.2* 12.7*  HCT 35.4* 32.9* 33.9* 34.5*  MCV 100.9* 99.4 98.8 94.5  PLT 114* 103* 144* 170   PT/INR: @labrcntip (inr:5) Cardiac Enzymes: No results for input(s): CKTOTAL, CKMB, CKMBINDEX, TROPONINI in the last 168 hours. CBG: Recent Labs  Lab 11/12/18 1232 11/12/18 1614 11/12/18 2210 11/13/18 0616 11/13/18 1205  GLUCAP 163* 150* 170* 158* 167*    Iron Studies: No results for input(s): IRON, TIBC, TRANSFERRIN, FERRITIN in the last 168 hours.  Xrays/Other Studies: Dg Chest 2 View  Result Date: 11/11/2018 CLINICAL DATA:  SOB. Hx of ventricular tachycardia, PVC's, pleural effusion, paroxysmal afib, ICD, endocarditis, dysrhythmia, diabetes, coronary artery disease, right/left heart cath and coronary/graft angiography(2018), CABG(2001). Former [phone]).SOB EXAM: CHEST - 2 VIEW COMPARISON:  11/04/2018 FINDINGS: LEFT-sided pacemaker overlies normal cardiac silhouette. Midline sternotomy. Several sternotomy wires for fractured. No change from prior. Small LEFT effusion is new from most recent comparison. Trace RIGHT effusion additionally. No overt pulmonary edema. No pneumothorax. IMPRESSION: 1. New bilateral small pleural effusions, LEFT greater than RIGHT. 2. No overt pulmonary edema. Electronically Signed   By: Suzy Bouchard M.D.   On: 11/11/2018 12:41   Vas US Carotid  Result Date: 11/12/2018 Carotid Arterial Duplex Study Indications:      Post right CEA 11/07/2018 with neck distention and                   discoloration. Risk Factors:     Diabetes, coronary artery disease. Comparison Study: 11/05/2018- 80-99% right ICA stenosis. Performing Technologist: Maudry Mayhew MHA, RDMS, RVT, RDCS  Examination Guidelines: A complete evaluation  includes B-mode imaging, spectral Doppler, color Doppler, and power Doppler as needed of all accessible portions of each vessel. Bilateral testing is considered an integral part of a complete examination. Limited examinations for reoccurring indications may be performed as noted.  Right Carotid Findings: +----------+--------+--------+--------+----------------------+--------+           PSV cm/sEDV cm/sStenosisPlaque Description    Comments +----------+--------+--------+--------+----------------------+--------+ CCA Prox  74      12                                             +----------+--------+--------+--------+----------------------+--------+  CCA Distal36      6                                              +----------+--------+--------+--------+----------------------+--------+ ICA Prox  80      25              smooth and homogeneous         +----------+--------+--------+--------+----------------------+--------+ ICA Distal58      14                                             +----------+--------+--------+--------+----------------------+--------+ ECA       81      10                                             +----------+--------+--------+--------+----------------------+--------+ Summary: Right Carotid: Right ICA appears to be patent post endarterectomy, with an area                of homogenous echoes; suggestive of possible thrombus versus                restenosis. Incidental findings: there is a heterogenous area of the right lateral neck measuring 5.3 x 4.1 x 6.1cm, suggestive of possible hematoma. The IJV is noncompressible and exhibits internal echoes, suggestive of acute deep vein thrombus.  *See table(s) above for measurements and observations.  Electronically signed by Deitra Mayo MD on 11/12/2018 at 3:32:40 PM.   Final      Assessment/Plan: 1.  Hyponatremia- appears to be hypervolemic hyponatremia with pleural effusions, however urine studies suggest  SIADH, although difficult to interpret as these were obtained after IV lasix and then after IV NS.  Will recheck labs now and start tolvaptan to help improve sodium levels.  Given pleural effusions, history of CHF/ICMP, and asymptomatic would not proceed with hypertonic saline infusion, however if his sodium continues to drop, he will need to be transferred to an ICU and hypertonic given gently.   1. Discontinue lasix and fluid restriction once taking tolvaptan. 2. Continue to follow sodium levels but more frequently (every 2 hours)  2. Neck hematoma following RCEA- was placed on eliquis following procedure.  No breathing issues and appears superficial.  Swallowing improving.  Continue to follow 3. DM type 2- per primary svc 4. HTN- stable 5. CAS s/p RCEA- Dr. Donzetta Matters following. 6. A fib- rate controlled 7. CAD s/p CABG- stable 8. Elevated bilirubin- unclear etiology.  Continue to follow and consider RUQ [name].   [name] 11/13/2018, 12:16 PM

## 2018-11-14 LAB — BASIC METABOLIC PANEL
Anion gap: 12 (ref 5–15)
Anion gap: 14 (ref 5–15)
Anion gap: 12 (ref 5–15)
BUN: 10 mg/dL (ref 8–23)
BUN: 10 mg/dL (ref 8–23)
BUN: 10 mg/dL (ref 8–23)
CO2: 22 mmol/L (ref 22–32)
CO2: 20 mmol/L — ABNORMAL LOW (ref 22–32)
CO2: 26 mmol/L (ref 22–32)
Calcium: 8.4 mg/dL — ABNORMAL LOW (ref 8.9–10.3)
Calcium: 8.5 mg/dL — ABNORMAL LOW (ref 8.9–10.3)
Calcium: 8.8 mg/dL — ABNORMAL LOW (ref 8.9–10.3)
Chloride: 82 mmol/L — ABNORMAL LOW (ref 98–111)
Chloride: 83 mmol/L — ABNORMAL LOW (ref 98–111)
Chloride: 81 mmol/L — ABNORMAL LOW (ref 98–111)
Creatinine, Ser: 0.8 mg/dL (ref 0.61–1.24)
Creatinine, Ser: 0.92 mg/dL (ref 0.61–1.24)
Creatinine, Ser: 0.99 mg/dL (ref 0.61–1.24)
GFR calc Af Amer: >60 mL/min (ref >60)
GFR calc Af Amer: >60 mL/min (ref >60)
GFR calc Af Amer: >60 mL/min (ref >60)
GFR calc non Af Amer: >60 mL/min (ref >60)
GFR calc non Af Amer: >60 mL/min (ref >60)
GFR calc non Af Amer: >60 mL/min (ref >60)
Glucose, Bld: 139 mg/dL — ABNORMAL HIGH (ref 70–99)
Glucose, Bld: 123 mg/dL — ABNORMAL HIGH (ref 70–99)
Glucose, Bld: 152 mg/dL — ABNORMAL HIGH (ref 70–99)
Potassium: 3.6 mmol/L (ref 3.5–5.1)
Potassium: 3.4 mmol/L — ABNORMAL LOW (ref 3.5–5.1)
Potassium: 3.6 mmol/L (ref 3.5–5.1)
Sodium: 116 mmol/L — CL (ref 135–145)
Sodium: 117 mmol/L — CL (ref 135–145)
Sodium: 119 mmol/L — CL (ref 135–145)

## 2018-11-14 LAB — RENAL FUNCTION PANEL
Albumin: 3.3 g/dL — ABNORMAL LOW (ref 3.5–5.0)
Anion gap: 13 (ref 5–15)
BUN: 8 mg/dL (ref 8–23)
CO2: 23 mmol/L (ref 22–32)
Calcium: 8.5 mg/dL — ABNORMAL LOW (ref 8.9–10.3)
Chloride: 80 mmol/L — ABNORMAL LOW (ref 98–111)
Creatinine, Ser: 0.92 mg/dL (ref 0.61–1.24)
GFR calc Af Amer: >60 mL/min (ref >60)
GFR calc non Af Amer: >60 mL/min (ref >60)
Glucose, Bld: 128 mg/dL — ABNORMAL HIGH (ref 70–99)
Phosphorus: 3.7 mg/dL (ref 2.5–4.6)
Potassium: 3.4 mmol/L — ABNORMAL LOW (ref 3.5–5.1)
Sodium: 116 mmol/L — CL (ref 135–145)

## 2018-11-14 LAB — CBC
HCT: 33.5 % — ABNORMAL LOW (ref 39.0–52.0)
Hemoglobin: 12.3 g/dL — ABNORMAL LOW (ref 13.0–17.0)
MCH: 34.6 pg — ABNORMAL HIGH (ref 26.0–34.0)
MCHC: 36.7 g/dL — ABNORMAL HIGH (ref 30.0–36.0)
MCV: 94.1 fL (ref 80.0–100.0)
Platelets: 176 10*3/uL (ref 150–400)
RBC: 3.56 MIL/uL — ABNORMAL LOW (ref 4.22–5.81)
RDW: 12.6 % (ref 11.5–15.5)
WBC: 6.9 10*3/uL (ref 4.0–10.5)
nRBC: 0 % (ref 0.0–0.2)

## 2018-11-14 LAB — OSMOLALITY, URINE: Osmolality, Ur: 287 mOsm/kg — ABNORMAL LOW (ref 300–900)

## 2018-11-14 LAB — GLUCOSE, CAPILLARY
Glucose-Capillary: 128 mg/dL — ABNORMAL HIGH (ref 70–99)
Glucose-Capillary: 165 mg/dL — ABNORMAL HIGH (ref 70–99)
Glucose-Capillary: 213 mg/dL — ABNORMAL HIGH (ref 70–99)
Glucose-Capillary: 199 mg/dL — ABNORMAL HIGH (ref 70–99)

## 2018-11-14 LAB — SODIUM
Sodium: 121 mmol/L — ABNORMAL LOW (ref 135–145)
Sodium: 121 mmol/L — ABNORMAL LOW (ref 135–145)
Sodium: 126 mmol/L — ABNORMAL LOW (ref 135–145)

## 2018-11-14 MED ORDER — POTASSIUM CHLORIDE CRYS ER 20 MEQ PO TBCR
20.0000 meq | EXTENDED_RELEASE_TABLET | Freq: Once | ORAL | Status: AC
  Administered 2018-11-14: 20 meq via ORAL
  Filled 2018-11-14: qty 1

## 2018-11-14 NOTE — Plan of Care (Signed)

## 2018-11-14 NOTE — Plan of Care (Signed)
  Problem: Education: Goal: Knowledge of General Education information will improve Description: Including pain rating scale, medication(s)/side effects and non-pharmacologic comfort measures Outcome: Progressing   Problem: Activity: Goal: Capacity to carry out activities will improve Outcome: Progressing   Problem: Cardiac: Goal: Ability to achieve and maintain adequate cardiopulmonary perfusion will improve Outcome: Progressing   

## 2018-11-14 NOTE — Telephone Encounter (Signed)
To Dr. Burt Knack and Dr. Caryl Comes as Juluis Rainier of patient's current hospitalization.

## 2018-11-14 NOTE — Progress Notes (Signed)
Patient ID: [name], male   DOB: [birth year], 85 y.o.   MRN: [id number] S: feels better today.  Admits to slight HA for past few days, no N/V O:BP 111/60 (BP Location: Right Arm)   Pulse 63   Temp 98.3 F (36.8 C) (Oral)   Resp 18   Ht 6\' 2"  (1.88 m)   Wt 91.3 kg   SpO2 99%   BMI 25.85 kg/m   Intake/Output Summary (Last 24 hours) at 11/14/2018 1033 Last data filed at 11/14/2018 0334 Gross per 24 hour  Intake 1019.57 ml  Output 1475 ml  Net -455.43 ml   Intake/Output: I/O last 3 completed shifts: In: 2448.7 [P.O.:1920; I.V.:486.7; Other:42] Out: 1600 [id number]  Intake/Output this shift:  No intake/output data recorded. Weight change: -2.223 kg Gen: NAD CVS: no rub Resp:bibasilar crackles Abd: +BS, soft, NT/ND Ext: trace presacral edema  Recent Labs  Lab 11/12/18 0847  11/12/18 2131 11/13/18 0620 11/13/18 0947 11/13/18 1314 11/13/18 1935 11/13/18 2319 11/14/18 0229 11/14/18 0520  NA 120*   < > 114* 117* 116* 117* 115* 116* 116* 117*  K 3.6   < > 3.7 4.1 3.7 3.4*  --  3.6 3.4* 3.4*  CL 82*   < > 81* 82* 82* 77*  --  82* 80* 83*  CO2 25   < > 21* 24 23 25   --  22 23 20*  GLUCOSE 214*   < > 164* 155* 180* 174*  --  139* 128* 123*  BUN 7*   < > 8 9 11 9   --  10 8 10   CREATININE 0.83   < > 0.75 0.68 0.79 0.84  --  0.80 0.92 0.92  ALBUMIN 3.4*  --   --  3.4*  --   --   --   --  3.3*  --   CALCIUM 8.1*   < > 8.1* 8.5* 8.2* 8.7*  --  8.4* 8.5* 8.5*  PHOS  --   --   --   --   --   --   --   --  3.7  --   AST 29  --   --  27  --   --   --   --   --   --   ALT 21  --   --  22  --   --   --   --   --   --    < > = values in this interval not displayed.   Liver Function Tests: Recent Labs  Lab 11/12/18 0847 11/13/18 0620 11/14/18 0229  AST 29 27  --   ALT 21 22  --   ALKPHOS 91 89  --   BILITOT 3.3* 3.6*  --   PROT 6.6 6.4*  --   ALBUMIN 3.4* 3.4* 3.3*   No results for input(s): LIPASE, AMYLASE in the last 168 hours. No results for input(s): AMMONIA  in the last 168 hours. CBC: Recent Labs  Lab 11/08/18 0130 11/11/18 1240 11/13/18 0620 11/14/18 0229  WBC 7.1 6.9 7.0 6.9  HGB 11.8* 12.2* 12.7* 12.3*  HCT 32.9* 33.9* 34.5* 33.5*  MCV 99.4 98.8 94.5 94.1  PLT 103* 144* 170 176   Cardiac Enzymes: No results for input(s): CKTOTAL, CKMB, CKMBINDEX, TROPONINI in the last 168 hours. CBG: Recent Labs  Lab 11/13/18 0616 11/13/18 1205 11/13/18 1703 11/13/18 2059 11/14/18 0609  GLUCAP 158* 167* 147* 169* 128*    Iron Studies: No results for  input(s): IRON, TIBC, TRANSFERRIN, FERRITIN in the last 72 hours. Studies/Results: No results found. Marland Kitchen amiodarone  200 mg Oral Daily  . apixaban  5 mg Oral BID  . atorvastatin  80 mg Oral QHS  . cholecalciferol  10,000 Units Oral Q M,W,F   And  . cholecalciferol  5,000 Units Oral Q T,Th,S,Su  . insulin aspart  0-5 Units Subcutaneous QHS  . insulin aspart  0-9 Units Subcutaneous TID WC  . linagliptin  5 mg Oral Daily  . metoprolol succinate  50 mg Oral BID  . nateglinide  120 mg Oral BID WC  . sodium chloride flush  3 mL Intravenous Q12H  . tolvaptan  15 mg Oral Q24H    BMET    Component Value Date/Time   NA 117 (LL) 11/14/2018 0520   NA 135 10/23/2018 1318   K 3.4 (L) 11/14/2018 0520   CL 83 (L) 11/14/2018 0520   CO2 20 (L) 11/14/2018 0520   GLUCOSE 123 (H) 11/14/2018 0520   BUN 10 11/14/2018 0520   BUN 16 10/23/2018 1318   CREATININE 0.92 11/14/2018 0520   CREATININE 1.08 01/04/2016 1630   CALCIUM 8.5 (L) 11/14/2018 0520   GFRNONAA >60 11/14/2018 0520   GFRAA >60 11/14/2018 0520   CBC    Component Value Date/Time   WBC 6.9 11/14/2018 0229   RBC 3.56 (L) 11/14/2018 0229   HGB 12.3 (L) 11/14/2018 0229   HGB 13.4 10/23/2018 1318   HCT 33.5 (L) 11/14/2018 0229   HCT 39.3 10/23/2018 1318   PLT 176 11/14/2018 0229   PLT 130 (L) 10/23/2018 1318   MCV 94.1 11/14/2018 0229   MCV 100 (H) 10/23/2018 1318   MCH 34.6 (H) 11/14/2018 0229   MCHC 36.7 (H) 11/14/2018 0229   RDW  12.6 11/14/2018 0229   RDW 12.4 10/23/2018 1318   LYMPHSABS 0.6 (L) 11/04/2018 1635   LYMPHSABS 0.9 12/27/2016 1523   MONOABS 0.7 11/04/2018 1635   EOSABS 0.3 11/04/2018 1635   EOSABS 0.2 12/27/2016 1523   BASOSABS 0.0 11/04/2018 1635   BASOSABS 0.0 12/27/2016 1523    Assessment/Plan: 1.  Hyponatremia- appears to be hypervolemic hyponatremia with pleural effusions, however urine studies suggest SIADH, although difficult to interpret as these were obtained after IV lasix and then after IV NS.  Will recheck labs now and start tolvaptan to help improve sodium levels.  Given pleural effusions, history of CHF/ICMP, and asymptomatic would not proceed with hypertonic saline infusion, however if his sodium continues to drop, he will need to be transferred to an ICU and hypertonic given gently.   1. Discontinued lasix and fluid restriction once started on tolvaptan 11/13/18. 2. Continue to follow sodium levels but more frequently (every 3 hours)  3. Would like to avoid hypertonic saline given his volume overload on exam and h/o ICMP. 2. Neck hematoma following RCEA- was placed on eliquis following procedure.  No breathing issues and appears superficial.  Swallowing improving.  Continue to follow 3. DM type 2- per primary svc 4. HTN- stable 5. CAS s/p RCEA- Dr. Donzetta Matters following. 6. A fib- rate controlled 7. CAD s/p CABG- stable 8. Elevated bilirubin- unclear etiology.  Continue to follow and consider RUQ [name].  Donetta Potts, MD [employer] [phone]

## 2018-11-14 NOTE — Telephone Encounter (Signed)
° ° °  Spouse wants to report patient is in the hospital

## 2018-11-14 NOTE — Care Management Important Message (Signed)
Important Message  Patient Details  Name: [name] MRN: [id number] Date of Birth: 17-Sep-1931   Medicare Important Message Given:  Yes     Shelda Altes 11/14/2018, 2:00 PM

## 2018-11-14 NOTE — Progress Notes (Signed)
[facility] PROGRESS NOTE  [name] [id number] DOB: 09/01/1931 DOA: 11/11/2018 PCP: [name], MD  Assessment/Plan:   Hyponatremia.  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  Sodium has fluctuated overnight with drop to 115, currently improving with tolvaptan -Nephrology consulted -Tolvaptan given.  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 Dyspnea and orthopnea for 3 days. Chest xray reveals new bilateral small pleural effusions with left greater than right at presentation, currently without any rales and on room air. ekg with Afib with PVC.  Recent echo with EF 35%, global left ventricular hypokinesis, moderately reduced systolic function, severe thickening of mitral valve. BNP 1295. Home meds include low dose lasix prn.  -Discontinue Lasix while taking tolvaptan -continue amiodarone and BB -monitor intake and output -obtain daily weight -monitor closely    Peripheral vascular disease/ carotid stenosis.  status post right endarterectomy on 10/8 with swelling and ecchymosis of right neck wound. [name] of neck reveals Right Carotid: Right ICA appears to be patent post endarterectomy, with an area of homogenous echoes; suggestive of possible thrombus versus  restenosis. Incidental findings: there is a heterogenous area of the right lateral neck measuring 5.3 x 4.1 x 6.1cm, suggestive of possible hematoma. The IJV is noncompressible and exhibits internal echoes, suggestive of acute deep vein thrombus -Vascular surgery to follow peripherally   A. fib/rate controlled. chadvasc score 8. -Continue with Eliquis and BB -monitor  Type 2 Diabetes without long term use of insulin. Serum glucose 161 on admission. Home  meds include januvia -continue home meds -SSI for optimal control -On Tradjenta and Starlix -monitor  Asthma-COPD overlap syndrome. Not on home oxygen. No s/sx exacerbation -continue home meds  CAD s/p CABG. No chest pain. ekg and HS trops as noted above.  -continue statin and asa   DVT Prophylaxis Eliquis    Code Status: full Family Communication: patient.  Discussed with daughter at bedside Disposition Plan: home when medically stable   Consultants:  Donzetta Matters vascular surgery  Nephrology  Procedures:  doppler  Antibiotics:  None  HPI/Subjective:  Patient seen and examined at bedside no acute distress and resting comfortably with daughter present.  Patient currently denies any acute complaints however her daughter states that he is still somewhat confused this morning since being admitted.  Patient denies any chest pain, shortness of breath, nausea, vomiting.  We had a very long discussion with the daughter at the bedside regarding patient's current clinical status and steps moving forward.  There in agreement and understand of the plan.  He does report he is dizzy on ambulation Objective: Vitals:   11/14/18 0048 11/14/18 0500  BP: (!) 93/56 118/65  Pulse: 72 (!) 59  Resp: 20 18  Temp: 99.2 F (37.3 C) (!) 97.5 F (36.4 C)  SpO2: 95% 99%    Intake/Output Summary (Last 24 hours) at 11/14/2018 0800 Last data filed at 11/14/2018 0334 Gross per 24 hour  Intake 1379.57 ml  Output 1475 ml  Net -95.43 ml   Filed Weights   11/12/18 1333 11/13/18 0046 11/14/18 0335  Weight: 93.5 kg 93.2 kg 91.3 kg    Exam:   General: AOx2 with slowed mentation on follow-up questioning and pale.  Cardiovascular: S1, S2 auscultated no murmurs.  Respiratory: no increased work of  breathing.  No rales or wheeze  Abdomen: soft +BS no guarding or rebounding  Musculoskeletal: joints without swelling/erythema  Right neck incision with swelling/erythema/bruising extending to right  anterior shoulder.  Improving ecchymosis  Data Reviewed: Basic Metabolic Panel: Recent Labs  Lab 11/13/18 0947 11/13/18 1314 11/13/18 1935 11/13/18 2319 11/14/18 0229 11/14/18 0520  NA 116* 117* 115* 116* 116* 117*  K 3.7 3.4*  --  3.6 3.4* 3.4*  CL 82* 77*  --  82* 80* 83*  CO2 23 25  --  22 23 20*  GLUCOSE 180* 174*  --  139* 128* 123*  BUN 11 9  --  10 8 10   CREATININE 0.79 0.84  --  0.80 0.92 0.92  CALCIUM 8.2* 8.7*  --  8.4* 8.5* 8.5*  PHOS  --   --   --   --  3.7  --    Liver Function Tests: Recent Labs  Lab 11/12/18 0847 11/13/18 0620 11/14/18 0229  AST 29 27  --   ALT 21 22  --   ALKPHOS 91 89  --   BILITOT 3.3* 3.6*  --   PROT 6.6 6.4*  --   ALBUMIN 3.4* 3.4* 3.3*   No results for input(s): LIPASE, AMYLASE in the last 168 hours. No results for input(s): AMMONIA in the last 168 hours. CBC: Recent Labs  Lab 11/08/18 0130 11/11/18 1240 11/13/18 0620 11/14/18 0229  WBC 7.1 6.9 7.0 6.9  HGB 11.8* 12.2* 12.7* 12.3*  HCT 32.9* 33.9* 34.5* 33.5*  MCV 99.4 98.8 94.5 94.1  PLT 103* 144* 170 176   Cardiac Enzymes: No results for input(s): CKTOTAL, CKMB, CKMBINDEX, TROPONINI in the last 168 hours. BNP (last 3 results) Recent Labs    03/04/18 0030 11/11/18 1240  BNP 219.8* 1,295.4*    ProBNP (last 3 results) Recent Labs    12/18/17 1506  PROBNP 1,465*    CBG: Recent Labs  Lab 11/13/18 0616 11/13/18 1205 11/13/18 1703 11/13/18 2059 11/14/18 0609  GLUCAP 158* 167* 147* 169* 128*    Recent Results (from the past 240 hour(s))  SARS CORONAVIRUS 2 (TAT 6-24 HRS) Nasopharyngeal Nasopharyngeal Swab     Status: None   Collection Time: 11/04/18 10:12 PM   Specimen: Nasopharyngeal Swab  Result Value Ref Range Status   SARS Coronavirus 2 NEGATIVE NEGATIVE Final    Comment: (NOTE) SARS-CoV-2 target nucleic acids are NOT DETECTED. The SARS-CoV-2 RNA is generally detectable in upper and lower respiratory specimens during the acute phase of  infection. Negative results do not preclude SARS-CoV-2 infection, do not rule out co-infections with other pathogens, and should not be used as the sole basis for treatment or other patient management decisions. Negative results must be combined with clinical observations, patient history, and epidemiological information. The expected result is Negative. Fact Sheet for Patients: SugarRoll.be Fact Sheet for Healthcare Providers: https://www.woods-mathews.com/ This test is not yet approved or cleared by the [place] FDA and  has been authorized for detection and/or diagnosis of SARS-CoV-2 by FDA under an Emergency Use Authorization (EUA). This EUA will remain  in effect (meaning this test can be used) for the duration of the COVID-19 declaration under Section 56 4(b)(1) of the Act, 21 U.S.C. section 360bbb-3(b)(1), unless the authorization is terminated or revoked sooner. Performed at [facility], [place] [address] [place], [place] [address]   MRSA PCR Screening     Status: None   Collection Time: 11/07/18  2:47 AM   Specimen: Nasal Mucosa;  Nasopharyngeal  Result Value Ref Range Status   MRSA by PCR NEGATIVE NEGATIVE Final    Comment:        The GeneXpert MRSA Assay (FDA approved for NASAL specimens only), is one component of a comprehensive MRSA colonization surveillance program. It is not intended to diagnose MRSA infection nor to guide or monitor treatment for MRSA infections. Performed at [facility], [place] [address] [place], [place] [address]   SARS CORONAVIRUS 2 (TAT 6-24 HRS) Nasopharyngeal Nasopharyngeal Swab     Status: None   Collection Time: 11/11/18  3:03 PM   Specimen: Nasopharyngeal Swab  Result Value Ref Range Status   SARS Coronavirus 2 NEGATIVE NEGATIVE Final    Comment: (NOTE) SARS-CoV-2 target nucleic acids are NOT DETECTED. The SARS-CoV-2 RNA is generally detectable in upper and lower respiratory  specimens during the acute phase of infection. Negative results do not preclude SARS-CoV-2 infection, do not rule out co-infections with other pathogens, and should not be used as the sole basis for treatment or other patient management decisions. Negative results must be combined with clinical observations, patient history, and epidemiological information. The expected result is Negative. Fact Sheet for Patients: SugarRoll.be Fact Sheet for Healthcare Providers: https://www.woods-mathews.com/ This test is not yet approved or cleared by the [place] FDA and  has been authorized for detection and/or diagnosis of SARS-CoV-2 by FDA under an Emergency Use Authorization (EUA). This EUA will remain  in effect (meaning this test can be used) for the duration of the COVID-19 declaration under Section 56 4(b)(1) of the Act, 21 U.S.C. section 360bbb-3(b)(1), unless the authorization is terminated or revoked sooner. Performed at [facility], [place] [address] [place], [place] [address]      Studies: Vas US Carotid  Result Date: 11/12/2018 Carotid Arterial Duplex Study Indications:      Post right CEA 11/07/2018 with neck distention and                   discoloration. Risk Factors:     Diabetes, coronary artery disease. Comparison Study: 11/05/2018- 80-99% right ICA stenosis. Performing Technologist: Maudry Mayhew MHA, RDMS, RVT, RDCS  Examination Guidelines: A complete evaluation includes B-mode imaging, spectral Doppler, color Doppler, and power Doppler as needed of all accessible portions of each vessel. Bilateral testing is considered an integral part of a complete examination. Limited examinations for reoccurring indications may be performed as noted.  Right Carotid Findings: +----------+--------+--------+--------+----------------------+--------+           PSV cm/sEDV cm/sStenosisPlaque Description    Comments  +----------+--------+--------+--------+----------------------+--------+ CCA Prox  74      12                                             +----------+--------+--------+--------+----------------------+--------+ CCA Distal36      6                                              +----------+--------+--------+--------+----------------------+--------+ ICA Prox  80      25              smooth and homogeneous         +----------+--------+--------+--------+----------------------+--------+ ICA Distal58      14                                             +----------+--------+--------+--------+----------------------+--------+  ECA       81      10                                             +----------+--------+--------+--------+----------------------+--------+ Summary: Right Carotid: Right ICA appears to be patent post endarterectomy, with an area                of homogenous echoes; suggestive of possible thrombus versus                restenosis. Incidental findings: there is a heterogenous area of the right lateral neck measuring 5.3 x 4.1 x 6.1cm, suggestive of possible hematoma. The IJV is noncompressible and exhibits internal echoes, suggestive of acute deep vein thrombus.  *See table(s) above for measurements and observations.  Electronically signed by Deitra Mayo MD on 11/12/2018 at 3:32:40 PM.   Final     Scheduled Meds: . amiodarone  200 mg Oral Daily  . apixaban  5 mg Oral BID  . atorvastatin  80 mg Oral QHS  . cholecalciferol  10,000 Units Oral Q M,W,F   And  . cholecalciferol  5,000 Units Oral Q T,Th,S,Su  . insulin aspart  0-5 Units Subcutaneous QHS  . insulin aspart  0-9 Units Subcutaneous TID WC  . linagliptin  5 mg Oral Daily  . metoprolol succinate  50 mg Oral BID  . nateglinide  120 mg Oral BID WC  . sodium chloride flush  3 mL Intravenous Q12H  . tolvaptan  15 mg Oral Q24H   Continuous Infusions: . sodium chloride Stopped (11/13/18 [id number])     Principal Problem:   Acute on chronic combined systolic (congestive) and diastolic (congestive) heart failure (HCC) Active Problems:   ATRIAL FIBRILLATION   Asthma-COPD overlap syndrome (HCC)   Type 2 diabetes mellitus with complication, without long-term current use of insulin (HCC)   CAD (coronary artery disease)   Hyponatremia   AICD (automatic cardioverter/defibrillator) present    Time spent: 35 minutes    Harold Hedge DO  [facility]  If 7PM-7AM, please contact night-coverage at [email or web address], password [facility] 11/14/2018, 8:00 AM  LOS: 2 days

## 2018-11-14 NOTE — Progress Notes (Signed)
Patient now A&O x4, answers all questions appropriately. Will continue to monitor throughout the shift.

## 2018-11-15 LAB — CBC
HCT: 39 % (ref 39.0–52.0)
Hemoglobin: 13.9 g/dL (ref 13.0–17.0)
MCH: 34.6 pg — ABNORMAL HIGH (ref 26.0–34.0)
MCHC: 35.6 g/dL (ref 30.0–36.0)
MCV: 97 fL (ref 80.0–100.0)
Platelets: 186 10*3/uL (ref 150–400)
RBC: 4.02 MIL/uL — ABNORMAL LOW (ref 4.22–5.81)
RDW: 12.8 % (ref 11.5–15.5)
WBC: 6.5 10*3/uL (ref 4.0–10.5)
nRBC: 0 % (ref 0.0–0.2)

## 2018-11-15 LAB — BASIC METABOLIC PANEL
Anion gap: 10 (ref 5–15)
Anion gap: 9 (ref 5–15)
BUN: 12 mg/dL (ref 8–23)
BUN: 14 mg/dL (ref 8–23)
CO2: 27 mmol/L (ref 22–32)
CO2: 30 mmol/L (ref 22–32)
Calcium: 9 mg/dL (ref 8.9–10.3)
Calcium: 9.2 mg/dL (ref 8.9–10.3)
Chloride: 93 mmol/L — ABNORMAL LOW (ref 98–111)
Chloride: 92 mmol/L — ABNORMAL LOW (ref 98–111)
Creatinine, Ser: 1.12 mg/dL (ref 0.61–1.24)
Creatinine, Ser: 1.1 mg/dL (ref 0.61–1.24)
GFR calc Af Amer: >60 mL/min (ref >60)
GFR calc Af Amer: >60 mL/min (ref >60)
GFR calc non Af Amer: 60 mL/min — ABNORMAL LOW (ref >60)
GFR calc non Af Amer: >60 mL/min (ref >60)
Glucose, Bld: 148 mg/dL — ABNORMAL HIGH (ref 70–99)
Glucose, Bld: 165 mg/dL — ABNORMAL HIGH (ref 70–99)
Potassium: 3.9 mmol/L (ref 3.5–5.1)
Potassium: 4.2 mmol/L (ref 3.5–5.1)
Sodium: 130 mmol/L — ABNORMAL LOW (ref 135–145)
Sodium: 131 mmol/L — ABNORMAL LOW (ref 135–145)

## 2018-11-15 LAB — HEPATIC FUNCTION PANEL
ALT: 33 U/L (ref 0–44)
AST: 37 U/L (ref 15–41)
Albumin: 3.7 g/dL (ref 3.5–5.0)
Alkaline Phosphatase: 91 U/L (ref 38–126)
Bilirubin, Direct: 0.5 mg/dL — ABNORMAL HIGH (ref 0.0–0.2)
Indirect Bilirubin: 2.9 mg/dL — ABNORMAL HIGH (ref 0.3–0.9)
Total Bilirubin: 3.4 mg/dL — ABNORMAL HIGH (ref 0.3–1.2)
Total Protein: 6.8 g/dL (ref 6.5–8.1)

## 2018-11-15 LAB — SODIUM: Sodium: 129 mmol/L — ABNORMAL LOW (ref 135–145)

## 2018-11-15 LAB — GLUCOSE, CAPILLARY
Glucose-Capillary: 142 mg/dL — ABNORMAL HIGH (ref 70–99)
Glucose-Capillary: 172 mg/dL — ABNORMAL HIGH (ref 70–99)
Glucose-Capillary: 144 mg/dL — ABNORMAL HIGH (ref 70–99)
Glucose-Capillary: 185 mg/dL — ABNORMAL HIGH (ref 70–99)

## 2018-11-15 MED ORDER — FUROSEMIDE 20 MG PO TABS
20.0000 mg | ORAL_TABLET | Freq: Every day | ORAL | Status: DC
  Administered 2018-11-15 – 2018-11-16 (×2): 20 mg via ORAL
  Filled 2018-11-15 (×2): qty 1

## 2018-11-15 NOTE — Progress Notes (Signed)
   Patient without complaint this morning.  Neck remains soft and ecchymosis is improving.  No swallowing issues.  He will follow-up with me as previously scheduled.  Brandon C. Donzetta Matters, MD [facility] Office: [phone] Pager: [phone]

## 2018-11-15 NOTE — Evaluation (Signed)
Physical Therapy Evaluation Patient Details Name: [name] MRN: [id number] DOB: 10/30/1931 Today's Date: 11/15/2018   History of Present Illness  85yo male with recent history of SOB, orthopnea, PNDs. Admit for CHF, hyponatremia. s/p R endarterectomy 11/07/18, also with possible acute IVJ thrombus. PMH V-tach, PAF, ICD placement, DM, mitral valve relplacement, knee arthroscopy, CABG, cardiac cath  Clinical Impression   Patient received in bed, very pleasant and willing to work therapy, able to complete bed mobility with Mod(I) and required S for functional transfers and gait with and without RW. Did demonstrate some functional balance impairments at EOB when challenged with dynamic tasks but able to maintain balance when walking today. He was left up in the chair with all needs met and questions/concerns addressed today. He will continue to benefit from skilled PT services in the acute setting, recommend skilled OP PT services moving forward.     Follow Up Recommendations Outpatient PT    Equipment Recommendations  None recommended by PT    Recommendations for Other Services       Precautions / Restrictions Precautions Precautions: ICD/Pacemaker;Fall Restrictions Weight Bearing Restrictions: No      Mobility  Bed Mobility Overal bed mobility: Independent                Transfers Overall transfer level: Needs assistance Equipment used: None Transfers: Sit to/from Stand Sit to Stand: Supervision         General transfer comment: S for safety with and without device, cues for safety and hand placement  Ambulation/Gait Ambulation/Gait assistance: Supervision Gait Distance (Feet): 150 Feet Assistive device: Rolling walker (2 wheeled);None Gait Pattern/deviations: Step-to pattern Gait velocity: decreased   General Gait Details: slow and steady with RW, also able to gait train in room with no device and S  Stairs            Wheelchair Mobility     Modified Rankin (Stroke Patients Only)       Balance Overall balance assessment: Needs assistance Sitting-balance support: Feet supported Sitting balance-Leahy Scale: Normal     Standing balance support: No upper extremity supported;During functional activity Standing balance-Leahy Scale: Fair Standing balance comment: mild unsteadiness                             Pertinent Vitals/Pain Pain Assessment: No/denies pain    Home Living Family/patient expects to be discharged to:: Private residence Living Arrangements: Spouse/significant other;Children(lives with their daughter who is 30, son lives in [place]) Available Help at Discharge: Family;Available 24 hours/day Type of Home: House Home Access: Stairs to enter Entrance Stairs-Rails: Can reach both Entrance Stairs-Number of Steps: 4 Home Layout: Two level Home Equipment: Shower seat;Bedside commode;Walker - 2 wheels;Cane - single point      Prior Function Level of Independence: Independent               Hand Dominance   Dominant Hand: Right    Extremity/Trunk Assessment   Upper Extremity Assessment Upper Extremity Assessment: Overall WFL for tasks assessed    Lower Extremity Assessment Lower Extremity Assessment: Generalized weakness    Cervical / Trunk Assessment Cervical / Trunk Assessment: Normal  Communication   Communication: No difficulties  Cognition Arousal/Alertness: Awake/alert Behavior During Therapy: WFL for tasks assessed/performed Overall Cognitive Status: Within Functional Limits for tasks assessed                                          General Comments      Exercises     Assessment/Plan    PT Assessment Patient needs continued PT services  PT Problem List Decreased mobility;Decreased safety awareness;Decreased coordination;Decreased balance       PT Treatment Interventions DME instruction;Therapeutic activities;Gait training;Therapeutic  exercise;Patient/family education;Stair training;Balance training;Functional mobility training;Neuromuscular re-education    PT Goals (Current goals can be found in the Care Plan section)  Acute Rehab PT Goals Patient Stated Goal: go home PT Goal Formulation: With patient Time For Goal Achievement: 11/29/18 Potential to Achieve Goals: Good    Frequency Min 3X/week   Barriers to discharge        Co-evaluation               AM-PAC PT "6 Clicks" Mobility  Outcome Measure Help needed turning from your back to your side while in a flat bed without using bedrails?: None Help needed moving from lying on your back to sitting on the side of a flat bed without using bedrails?: None Help needed moving to and from a bed to a chair (including a wheelchair)?: A Little Help needed standing up from a chair using your arms (e.g., wheelchair or bedside chair)?: A Little Help needed to walk in hospital room?: A Little Help needed climbing 3-5 steps with a railing? : A Little 6 Click Score: 20    End of Session   Activity Tolerance: Patient tolerated treatment well Patient left: in chair;with call bell/phone within reach   PT Visit Diagnosis: Unsteadiness on feet (R26.81);Difficulty in walking, not elsewhere classified (R26.2)    Time: 1230-1250 PT Time Calculation (min) (ACUTE ONLY): 20 min   Charges:   PT Evaluation $PT Eval Low Complexity: 1 Low          Kristen Unger PT, DPT, CBIS  Supplemental Physical Therapist [place]    Pager [phone] Acute Rehab Office [phone]    

## 2018-11-15 NOTE — Progress Notes (Signed)
Eval complete, formal note to follow. Patient moving very well and will only require skilled OP PT services moving forward.   Deniece Ree PT, DPT, CBIS  Supplemental Physical Therapist [facility]    Pager [phone] Acute Rehab Office [phone]

## 2018-11-15 NOTE — Progress Notes (Addendum)
Patient ID: [name], male   DOB: 10/16/1931, 85 y.o.   MRN: [id number] S: Feels "great" today.  No issues O:BP 106/76 (BP Location: Right Arm)   Pulse 61   Temp 97.9 F (36.6 C) (Oral)   Resp 16   Ht 6\' 2"  (1.88 m)   Wt 91.3 kg   SpO2 93%   BMI 25.85 kg/m   Intake/Output Summary (Last 24 hours) at 11/15/2018 1350 Last data filed at 11/15/2018 1257 Gross per 24 hour  Intake 960 ml  Output 500 ml  Net 460 ml   Intake/Output: I/O last 3 completed shifts: In: 1080 [P.O.:1080] Out: 2175 [Urine:2175]  Intake/Output this shift:  Total I/O In: 600 [P.O.:600] Out: -  Weight change:  Gen: NAD CVS: no rub Resp: decreased BS at bases Abd: +BS, soft, NT/ND Ext: no edema  Recent Labs  Lab 11/12/18 0847  11/13/18 0620 11/13/18 0947 11/13/18 1314  11/13/18 2319 11/14/18 0229 11/14/18 0520 11/14/18 1114 11/14/18 1511 11/14/18 1821 11/14/18 2238 11/15/18 0357 11/15/18 0657  NA 120*   < > 117* 116* 117*   < > 116* 116* 117* 119* 121* 121* 126* 129* 130*  K 3.6   < > 4.1 3.7 3.4*  --  3.6 3.4* 3.4* 3.6  --   --   --   --  3.9  CL 82*   < > 82* 82* 77*  --  82* 80* 83* 81*  --   --   --   --  93*  CO2 25   < > 24 23 25   --  22 23 20* 26  --   --   --   --  27  GLUCOSE 214*   < > 155* 180* 174*  --  139* 128* 123* 152*  --   --   --   --  148*  BUN 7*   < > 9 11 9   --  10 8 10 10   --   --   --   --  12  CREATININE 0.83   < > 0.68 0.79 0.84  --  0.80 0.92 0.92 0.99  --   --   --   --  1.12  ALBUMIN 3.4*  --  3.4*  --   --   --   --  3.3*  --   --   --   --   --   --  3.7  CALCIUM 8.1*   < > 8.5* 8.2* 8.7*  --  8.4* 8.5* 8.5* 8.8*  --   --   --   --  9.0  PHOS  --   --   --   --   --   --   --  3.7  --   --   --   --   --   --   --   AST 29  --  27  --   --   --   --   --   --   --   --   --   --   --  37  ALT 21  --  22  --   --   --   --   --   --   --   --   --   --   --  33   < > = values in this interval not displayed.   Liver Function Tests: Recent Labs  Lab  11/12/18 [phone]  11/13/18 0620 11/14/18 0229 11/15/18 0657  AST 29 27  --  37  ALT 21 22  --  33  ALKPHOS 91 89  --  91  BILITOT 3.3* 3.6*  --  3.4*  PROT 6.6 6.4*  --  6.8  ALBUMIN 3.4* 3.4* 3.3* 3.7   No results for input(s): LIPASE, AMYLASE in the last 168 hours. No results for input(s): AMMONIA in the last 168 hours. CBC: Recent Labs  Lab 11/11/18 1240 11/13/18 0620 11/14/18 0229 11/15/18 0657  WBC 6.9 7.0 6.9 6.5  HGB 12.2* 12.7* 12.3* 13.9  HCT 33.9* 34.5* 33.5* 39.0  MCV 98.8 94.5 94.1 97.0  PLT 144* 170 176 186   Cardiac Enzymes: No results for input(s): CKTOTAL, CKMB, CKMBINDEX, TROPONINI in the last 168 hours. CBG: Recent Labs  Lab 11/14/18 1236 11/14/18 1837 11/14/18 2120 11/15/18 0645 11/15/18 1136  GLUCAP 165* 213* 199* 142* 172*    Iron Studies: No results for input(s): IRON, TIBC, TRANSFERRIN, FERRITIN in the last 72 hours. Studies/Results: No results found. Marland Kitchen amiodarone  200 mg Oral Daily  . apixaban  5 mg Oral BID  . atorvastatin  80 mg Oral QHS  . cholecalciferol  10,000 Units Oral Q M,W,F   And  . cholecalciferol  5,000 Units Oral Q T,Th,S,Su  . insulin aspart  0-5 Units Subcutaneous QHS  . insulin aspart  0-9 Units Subcutaneous TID WC  . linagliptin  5 mg Oral Daily  . metoprolol succinate  50 mg Oral BID  . nateglinide  120 mg Oral BID WC  . sodium chloride flush  3 mL Intravenous Q12H    BMET    Component Value Date/Time   NA 130 (L) 11/15/2018 0657   NA 135 10/23/2018 1318   K 3.9 11/15/2018 0657   CL 93 (L) 11/15/2018 0657   CO2 27 11/15/2018 0657   GLUCOSE 148 (H) 11/15/2018 0657   BUN 12 11/15/2018 0657   BUN 16 10/23/2018 1318   CREATININE 1.12 11/15/2018 0657   CREATININE 1.08 01/04/2016 1630   CALCIUM 9.0 11/15/2018 0657   GFRNONAA 60 (L) 11/15/2018 0657   GFRAA >60 11/15/2018 0657   CBC    Component Value Date/Time   WBC 6.5 11/15/2018 0657   RBC 4.02 (L) 11/15/2018 0657   HGB 13.9 11/15/2018 0657   HGB 13.4  10/23/2018 1318   HCT 39.0 11/15/2018 0657   HCT 39.3 10/23/2018 1318   PLT 186 11/15/2018 0657   PLT 130 (L) 10/23/2018 1318   MCV 97.0 11/15/2018 0657   MCV 100 (H) 10/23/2018 1318   MCH 34.6 (H) 11/15/2018 0657   MCHC 35.6 11/15/2018 0657   RDW 12.8 11/15/2018 0657   RDW 12.4 10/23/2018 1318   LYMPHSABS 0.6 (L) 11/04/2018 1635   LYMPHSABS 0.9 12/27/2016 1523   MONOABS 0.7 11/04/2018 1635   EOSABS 0.3 11/04/2018 1635   EOSABS 0.2 12/27/2016 1523   BASOSABS 0.0 11/04/2018 1635   BASOSABS 0.0 12/27/2016 1523     Assessment/Plan: 1. Hyponatremia- appears to be hypervolemic hyponatremia with pleural effusions, however urine studies suggest SIADH, although difficult to interpret as these were obtained after IV lasix and then after IV NS. Will recheck labs now and start tolvaptan to help improve sodium levels. Given pleural effusions, history of CHF/ICMP, and asymptomatic would not proceed with hypertonic saline infusion, however if his sodium continues to drop, he will need to be transferred to an ICU and hypertonic given gently.  1. Markedly improved after starting  tolvaptan on 11/13/18 2. Stop tolvaptan and resume fluid restriction  3. Continue to follow sodium levels but less frequently (every 6 hours)  4. No indication for hypertonic saline 5. Ok to resume home lasix dose of 20 mg daily 2. Neck hematoma following RCEA- was placed on eliquis following procedure. No breathing issues and appears superficial. Swallowing improving. Continue to follow 3. DM type 2- per primary svc 4. HTN- stable 5. CAS s/p RCEA- Dr. Donzetta Matters following. 6. A fib- rate controlled 7. CAD s/p CABG- stable 8. Elevated bilirubin- unclear etiology. Continue to follow and consider RUQ [name].  Donetta Potts, MD [employer] [phone]

## 2018-11-15 NOTE — Progress Notes (Signed)
[facility] PROGRESS NOTE  [name] [id number] DOB: May 20, 1931 DOA: 11/11/2018 PCP: [name], MD  Assessment/Plan:   Hyponatremia.  Concern for SIADH (more likely with recent CEA, not on SSRI) versus volume overload started on tolvaptan with significant improvement. Symptomatic improvement but often underreports his symptoms. -Nephrology on board PT/OT -Tolvaptan given.  Hold Lasix while getting tolvaptan -Monitor serial BMPs -Fall precautions  Acute on chronic combined heart failure with EF 35% s/p AICD.  Resolved, appears euvolemic today.  Dyspnea and orthopnea for 3 days. Chest xray reveals new bilateral small pleural effusions with left greater than right at presentation, currently without any rales and on room air. ekg with Afib with PVC.  Recent echo with EF 35%, global left ventricular hypokinesis, moderately reduced systolic function, severe thickening of mitral valve. BNP 1295. Home meds include low dose lasix prn.  -Discontinue Lasix while taking tolvaptan -continue amiodarone and BB -monitor intake and output -obtain daily weight -monitor closely    Peripheral vascular disease/ carotid stenosis. Healing well. status post right endarterectomy on 10/8 with swelling and ecchymosis of right neck wound. [name] of neck reveals Right Carotid: Right ICA appears to be patent post endarterectomy, with an area of homogenous echoes; suggestive of possible thrombus versus  restenosis. Incidental findings: there is a heterogenous area of the right lateral neck measuring 5.3 x 4.1 x 6.1cm, suggestive of possible hematoma. The IJV is noncompressible and exhibits internal echoes, suggestive of acute deep vein thrombus -Vascular surgery to follow peripherally   A. fib/rate controlled.  chadvasc score 8. -Continue with Eliquis and BB -monitor  Type 2 Diabetes without long term use of insulin.  Stable  Home meds include januvia -continue home meds -SSI for optimal  control -On Tradjenta and Starlix -monitor  Asthma-COPD overlap syndrome. Not on home oxygen. No s/sx exacerbation -continue home meds  CAD s/p CABG. No chest pain. ekg and HS trops as noted above.  -continue statin and asa   DVT Prophylaxis Eliquis    Code Status: full Disposition Plan: home when medically stable   Consultants:  [facility] vascular surgery  Nephrology  Procedures:  doppler  Antibiotics:  None  HPI/Subjective:  Patient seen lying in bed today in good spirits with a big smile on his face. States he feels much better and denies any dizziness with ambulation. Denies any pain, shortness of breath, nausea/vomiting, swelling or any other complaints.  Objective: Vitals:   11/15/18 0001 11/15/18 0405  BP: 104/68 126/86  Pulse: 60 77  Resp: 17 17  Temp: (!) 97.4 F (36.3 C) (!) 97.5 F (36.4 C)  SpO2: 96% 98%    Intake/Output Summary (Last 24 hours) at 11/15/2018 0754 Last data filed at 11/15/2018 0600 Gross per 24 hour  Intake 840 ml  Output 1500 ml  Net -660 ml   Filed Weights   11/12/18 1333 11/13/18 0046 11/14/18 0335  Weight: 93.5 kg 93.2 kg 91.3 kg    Exam:   General: AOx3 in good spirits  Cardiovascular: S1, S2 auscultated no murmurs.  Respiratory: no increased work of breathing.  No rales or wheeze  Abdomen: soft +BS no guarding or rebounding  Musculoskeletal: joints without swelling/erythema  Psych: improved mentation appears at/near baseline.   Right neck incision with swelling/erythema/bruising extending to right anterior shoulder.  Improving ecchymosis  Data Reviewed: Basic Metabolic Panel: Recent Labs  Lab 11/13/18 1314  11/13/18 2319 11/14/18 0229 11/14/18 0520 11/14/18 1114 11/14/18 1511 11/14/18 1821 11/14/18 2238 11/15/18 0357  NA 117*   < >  116* 116* 117* 119* 121* 121* 126* 129*  K 3.4*  --  3.6 3.4* 3.4* 3.6  --   --   --   --   CL 77*  --  82* 80* 83* 81*  --   --   --   --   CO2 25  --  22 23 20* 26  --    --   --   --   GLUCOSE 174*  --  139* 128* 123* 152*  --   --   --   --   BUN 9  --  10 8 10 10   --   --   --   --   CREATININE 0.84  --  0.80 0.92 0.92 0.99  --   --   --   --   CALCIUM 8.7*  --  8.4* 8.5* 8.5* 8.8*  --   --   --   --   PHOS  --   --   --  3.7  --   --   --   --   --   --    < > = values in this interval not displayed.   Liver Function Tests: Recent Labs  Lab 11/12/18 0847 11/13/18 0620 11/14/18 0229  AST 29 27  --   ALT 21 22  --   ALKPHOS 91 89  --   BILITOT 3.3* 3.6*  --   PROT 6.6 6.4*  --   ALBUMIN 3.4* 3.4* 3.3*   No results for input(s): LIPASE, AMYLASE in the last 168 hours. No results for input(s): AMMONIA in the last 168 hours. CBC: Recent Labs  Lab 11/11/18 1240 11/13/18 0620 11/14/18 0229 11/15/18 0657  WBC 6.9 7.0 6.9 6.5  HGB 12.2* 12.7* 12.3* 13.9  HCT 33.9* 34.5* 33.5* 39.0  MCV 98.8 94.5 94.1 97.0  PLT 144* 170 176 186   Cardiac Enzymes: No results for input(s): CKTOTAL, CKMB, CKMBINDEX, TROPONINI in the last 168 hours. BNP (last 3 results) Recent Labs    03/04/18 0030 11/11/18 1240  BNP 219.8* 1,295.4*    ProBNP (last 3 results) Recent Labs    12/18/17 1506  PROBNP 1,465*    CBG: Recent Labs  Lab 11/14/18 0609 11/14/18 1236 11/14/18 1837 11/14/18 2120 11/15/18 0645  GLUCAP 128* 165* 213* 199* 142*    Recent Results (from the past 240 hour(s))  MRSA PCR Screening     Status: None   Collection Time: 11/07/18  2:47 AM   Specimen: Nasal Mucosa; Nasopharyngeal  Result Value Ref Range Status   MRSA by PCR NEGATIVE NEGATIVE Final    Comment:        The GeneXpert MRSA Assay (FDA approved for NASAL specimens only), is one component of a comprehensive MRSA colonization surveillance program. It is not intended to diagnose MRSA infection nor to guide or monitor treatment for MRSA infections. Performed at [facility], [place] [address] [place], [place] [address]   SARS CORONAVIRUS 2 (TAT 6-24 HRS)  Nasopharyngeal Nasopharyngeal Swab     Status: None   Collection Time: 11/11/18  3:03 PM   Specimen: Nasopharyngeal Swab  Result Value Ref Range Status   SARS Coronavirus 2 NEGATIVE NEGATIVE Final    Comment: (NOTE) SARS-CoV-2 target nucleic acids are NOT DETECTED. The SARS-CoV-2 RNA is generally detectable in upper and lower respiratory specimens during the acute phase of infection. Negative results do not preclude SARS-CoV-2 infection, do not rule out co-infections with other pathogens,  and should not be used as the sole basis for treatment or other patient management decisions. Negative results must be combined with clinical observations, patient history, and epidemiological information. The expected result is Negative. Fact Sheet for Patients: SugarRoll.be Fact Sheet for Healthcare Providers: https://www.woods-mathews.com/ This test is not yet approved or cleared by the [place] FDA and  has been authorized for detection and/or diagnosis of SARS-CoV-2 by FDA under an Emergency Use Authorization (EUA). This EUA will remain  in effect (meaning this test can be used) for the duration of the COVID-19 declaration under Section 56 4(b)(1) of the Act, 21 U.S.C. section 360bbb-3(b)(1), unless the authorization is terminated or revoked sooner. Performed at [facility], [place] [address], [place], [place] [address]      Studies: No results found.  Scheduled Meds: . amiodarone  200 mg Oral Daily  . apixaban  5 mg Oral BID  . atorvastatin  80 mg Oral QHS  . cholecalciferol  10,000 Units Oral Q M,W,F   And  . cholecalciferol  5,000 Units Oral Q T,Th,S,Su  . insulin aspart  0-5 Units Subcutaneous QHS  . insulin aspart  0-9 Units Subcutaneous TID WC  . linagliptin  5 mg Oral Daily  . metoprolol succinate  50 mg Oral BID  . nateglinide  120 mg Oral BID WC  . sodium chloride flush  3 mL Intravenous Q12H  . tolvaptan  15 mg Oral Q24H    Continuous Infusions: . sodium chloride Stopped (11/13/18 [id number])    Principal Problem:   Acute on chronic combined systolic (congestive) and diastolic (congestive) heart failure (HCC) Active Problems:   ATRIAL FIBRILLATION   Asthma-COPD overlap syndrome (HCC)   Type 2 diabetes mellitus with complication, without long-term current use of insulin (HCC)   CAD (coronary artery disease)   Hyponatremia   AICD (automatic cardioverter/defibrillator) present    Time spent: 20 minutes    Harold Hedge DO  [facility]  If 7PM-7AM, please contact night-coverage at [email or web address], password [facility] 11/15/2018, 7:54 AM  LOS: 3 days

## 2018-11-16 LAB — BASIC METABOLIC PANEL
Anion gap: 11 (ref 5–15)
BUN: 18 mg/dL (ref 8–23)
CO2: 26 mmol/L (ref 22–32)
Calcium: 8.9 mg/dL (ref 8.9–10.3)
Chloride: 94 mmol/L — ABNORMAL LOW (ref 98–111)
Creatinine, Ser: 1.17 mg/dL (ref 0.61–1.24)
GFR calc Af Amer: >60 mL/min (ref >60)
GFR calc non Af Amer: 57 mL/min — ABNORMAL LOW (ref >60)
Glucose, Bld: 142 mg/dL — ABNORMAL HIGH (ref 70–99)
Potassium: 3.7 mmol/L (ref 3.5–5.1)
Sodium: 131 mmol/L — ABNORMAL LOW (ref 135–145)

## 2018-11-16 LAB — GLUCOSE, CAPILLARY
Glucose-Capillary: 147 mg/dL — ABNORMAL HIGH (ref 70–99)
Glucose-Capillary: 176 mg/dL — ABNORMAL HIGH (ref 70–99)

## 2018-11-16 LAB — CBC
HCT: 36.8 % — ABNORMAL LOW (ref 39.0–52.0)
Hemoglobin: 13.1 g/dL (ref 13.0–17.0)
MCH: 34.7 pg — ABNORMAL HIGH (ref 26.0–34.0)
MCHC: 35.6 g/dL (ref 30.0–36.0)
MCV: 97.6 fL (ref 80.0–100.0)
Platelets: 160 10*3/uL (ref 150–400)
RBC: 3.77 MIL/uL — ABNORMAL LOW (ref 4.22–5.81)
RDW: 13.1 % (ref 11.5–15.5)
WBC: 7.2 10*3/uL (ref 4.0–10.5)
nRBC: 0 % (ref 0.0–0.2)

## 2018-11-16 LAB — HEPATIC FUNCTION PANEL
ALT: 33 U/L (ref 0–44)
AST: 35 U/L (ref 15–41)
Albumin: 3.4 g/dL — ABNORMAL LOW (ref 3.5–5.0)
Alkaline Phosphatase: 94 U/L (ref 38–126)
Bilirubin, Direct: 0.4 mg/dL — ABNORMAL HIGH (ref 0.0–0.2)
Indirect Bilirubin: 1.8 mg/dL — ABNORMAL HIGH (ref 0.3–0.9)
Total Bilirubin: 2.2 mg/dL — ABNORMAL HIGH (ref 0.3–1.2)
Total Protein: 6.3 g/dL — ABNORMAL LOW (ref 6.5–8.1)

## 2018-11-16 NOTE — TOC Transition Note (Signed)
Transition of Care [facility]) - CM/SW Discharge Note   Patient Details  Name: [name] MRN: [id number] Date of Birth: Nov 10, 1931  Transition of Care [facility]) CM/SW Contact:  Zenon Mayo, RN Phone Number: 11/16/2018, 12:34 PM   Clinical Narrative:    Patient for dc home today, he lives with wife and daughter, NCM asked if could set up outpatient phsyical therapy for him, he said no he does not want it, he does just fine getting around.  NCM explained to him that he would drive to the facility to get the therapy, he states he does not want it.  He has no other needs.   Final next level of care: Home/Self Care Barriers to Discharge: No Barriers Identified   Patient Goals and CMS Choice Patient states their goals for this hospitalization and ongoing recovery are:: go home      Discharge Placement                       Discharge Plan and Services                DME Arranged: (NA)         HH Arranged: NA          Social Determinants of Health (SDOH) Interventions     Readmission Risk Interventions No flowsheet data found.

## 2018-11-16 NOTE — Progress Notes (Signed)
Patient ID: [name], male   DOB: 10-05-1931, 85 y.o.   MRN: [id number] S: Feels great O:BP 119/71 (BP Location: Right Arm)   Pulse 63   Temp 97.9 F (36.6 C) (Oral)   Resp 16   Ht 6\' 2"  (1.88 m)   Wt 88.2 kg   SpO2 100%   BMI 24.96 kg/m   Intake/Output Summary (Last 24 hours) at 11/16/2018 1101 Last data filed at 11/16/2018 0813 Gross per 24 hour  Intake 1097 ml  Output 400 ml  Net 697 ml   Intake/Output: I/O last 3 completed shifts: In: [id number] [P.O.:1337] Out: 900 [Urine:900]  Intake/Output this shift:  Total I/O In: 480 [P.O.:480] Out: -  Weight change:  Gen: NAD CVS: no rub Resp: bibasilar crackles Abd: +BS, soft, NT/ND Ext: no edema  Recent Labs  Lab 11/12/18 0847  11/13/18 0620  11/13/18 2319 11/14/18 0229 11/14/18 0520 11/14/18 1114 11/14/18 1511 11/14/18 1821 11/14/18 2238 11/15/18 0357 11/15/18 0657 11/15/18 1446 11/16/18 0529  NA 120*   < > 117*   < > 116* 116* 117* 119* 121* 121* 126* 129* 130* 131* 131*  K 3.6   < > 4.1   < > 3.6 3.4* 3.4* 3.6  --   --   --   --  3.9 4.2 3.7  CL 82*   < > 82*   < > 82* 80* 83* 81*  --   --   --   --  93* 92* 94*  CO2 25   < > 24   < > 22 23 20* 26  --   --   --   --  27 30 26   GLUCOSE 214*   < > 155*   < > 139* 128* 123* 152*  --   --   --   --  148* 165* 142*  BUN 7*   < > 9   < > 10 8 10 10   --   --   --   --  12 14 18   CREATININE 0.83   < > 0.68   < > 0.80 0.92 0.92 0.99  --   --   --   --  1.12 1.10 1.17  ALBUMIN 3.4*  --  3.4*  --   --  3.3*  --   --   --   --   --   --  3.7  --  3.4*  CALCIUM 8.1*   < > 8.5*   < > 8.4* 8.5* 8.5* 8.8*  --   --   --   --  9.0 9.2 8.9  PHOS  --   --   --   --   --  3.7  --   --   --   --   --   --   --   --   --   AST 29  --  27  --   --   --   --   --   --   --   --   --  37  --  35  ALT 21  --  22  --   --   --   --   --   --   --   --   --  33  --  33   < > = values in this interval not displayed.   Liver Function Tests: Recent Labs  Lab 11/13/18 0620  11/14/18 0229 11/15/18 0657 11/16/18  0529  AST 27  --  37 35  ALT 22  --  33 33  ALKPHOS 89  --  91 94  BILITOT 3.6*  --  3.4* 2.2*  PROT 6.4*  --  6.8 6.3*  ALBUMIN 3.4* 3.3* 3.7 3.4*   No results for input(s): LIPASE, AMYLASE in the last 168 hours. No results for input(s): AMMONIA in the last 168 hours. CBC: Recent Labs  Lab 11/11/18 1240 11/13/18 0620 11/14/18 0229 11/15/18 0657 11/16/18 0529  WBC 6.9 7.0 6.9 6.5 7.2  HGB 12.2* 12.7* 12.3* 13.9 13.1  HCT 33.9* 34.5* 33.5* 39.0 36.8*  MCV 98.8 94.5 94.1 97.0 97.6  PLT 144* 170 176 186 160   Cardiac Enzymes: No results for input(s): CKTOTAL, CKMB, CKMBINDEX, TROPONINI in the last 168 hours. CBG: Recent Labs  Lab 11/15/18 0645 11/15/18 1136 11/15/18 1626 11/15/18 2157 11/16/18 0600  GLUCAP 142* 172* 144* 185* 147*    Iron Studies: No results for input(s): IRON, TIBC, TRANSFERRIN, FERRITIN in the last 72 hours. Studies/Results: No results found. Marland Kitchen amiodarone  200 mg Oral Daily  . apixaban  5 mg Oral BID  . atorvastatin  80 mg Oral QHS  . cholecalciferol  10,000 Units Oral Q M,W,F   And  . cholecalciferol  5,000 Units Oral Q T,Th,S,Su  . furosemide  20 mg Oral Daily  . insulin aspart  0-5 Units Subcutaneous QHS  . insulin aspart  0-9 Units Subcutaneous TID WC  . linagliptin  5 mg Oral Daily  . metoprolol succinate  50 mg Oral BID  . nateglinide  120 mg Oral BID WC  . sodium chloride flush  3 mL Intravenous Q12H    BMET    Component Value Date/Time   NA 131 (L) 11/16/2018 0529   NA 135 10/23/2018 1318   K 3.7 11/16/2018 0529   CL 94 (L) 11/16/2018 0529   CO2 26 11/16/2018 0529   GLUCOSE 142 (H) 11/16/2018 0529   BUN 18 11/16/2018 0529   BUN 16 10/23/2018 1318   CREATININE 1.17 11/16/2018 0529   CREATININE 1.08 01/04/2016 1630   CALCIUM 8.9 11/16/2018 0529   GFRNONAA 57 (L) 11/16/2018 0529   GFRAA >60 11/16/2018 0529   CBC    Component Value Date/Time   WBC 7.2 11/16/2018 0529   RBC 3.77 (L)  11/16/2018 0529   HGB 13.1 11/16/2018 0529   HGB 13.4 10/23/2018 1318   HCT 36.8 (L) 11/16/2018 0529   HCT 39.3 10/23/2018 1318   PLT 160 11/16/2018 0529   PLT 130 (L) 10/23/2018 1318   MCV 97.6 11/16/2018 0529   MCV 100 (H) 10/23/2018 1318   MCH 34.7 (H) 11/16/2018 0529   MCHC 35.6 11/16/2018 0529   RDW 13.1 11/16/2018 0529   RDW 12.4 10/23/2018 1318   LYMPHSABS 0.6 (L) 11/04/2018 1635   LYMPHSABS 0.9 12/27/2016 1523   MONOABS 0.7 11/04/2018 1635   EOSABS 0.3 11/04/2018 1635   EOSABS 0.2 12/27/2016 1523   BASOSABS 0.0 11/04/2018 1635   BASOSABS 0.0 12/27/2016 1523    Assessment/Plan: 1. Hyponatremia- appears to be hypervolemic hyponatremia with pleural effusions, however urine studies suggest SIADH, although difficult to interpret as these were obtained after IV lasix and then after IV NS. Will recheck labs now and start tolvaptan to help improve sodium levels. Given pleural effusions, history of CHF/ICMP, and asymptomatic would not proceed with hypertonic saline infusion, however if his sodium continues to drop, he will need to be transferred to an ICU  and hypertonic given gently.  1. Markedly improved after starting tolvaptan on 11/13/18 and stopped 11/15/18 with stable sodium. 2. Continue fluid restriction after discharge 3. Back on home lasix dose of 20 mg daily 4. Nothing further to add.  Will sign off please call with questions or concerns. 5. He will need to f/u with his pcp after discharge to repeat sodium levels.  Will only need f/u in our office if his Na falls again. 2. Neck hematoma following RCEA- was placed on eliquis following procedure. No breathing issues and appears superficial. Swallowing improving. Continue to follow 3. DM type 2- per primary svc 4. HTN- stable 5. CAS s/p RCEA- Dr. Donzetta Matters following. 6. A fib- rate controlled 7. CAD s/p CABG- stable 8. Elevated bilirubin- unclear etiology. Continue to follow and consider RUQ [name]. Donetta Potts,  MD [employer] [phone]

## 2018-11-16 NOTE — Progress Notes (Signed)
Nsg Discharge Note  Admit Date:  11/11/2018 Discharge date: 11/16/2018   [name] to be D/C'd home per MD order.  AVS completed. Patient/caregiver able to verbalize understanding.  Discharge Medication: Allergies as of 11/16/2018      Reactions   Latex Other (See Comments)   REDNESS AT REACTION SITE.   Metformin Diarrhea   Rivaroxaban Other (See Comments)   Headaches, blurred vision   Other Other (See Comments)   BEE STINGS   Sotalol Other (See Comments)   "BLUE TOES"- cut his circulation off   Warfarin Sodium Other (See Comments)   REACTION: migraine headaches/vision impariment      Medication List    STOP taking these medications   PreserVision AREDS 2 Caps     TAKE these medications   acetaminophen 325 MG tablet Commonly known as: TYLENOL Take 2 tablets (650 mg total) by mouth every 6 (six) hours as needed for mild pain (or Fever >/= 101).   amiodarone 200 MG tablet Commonly known as: PACERONE Take 1 tablet (200 mg total) TWICE daily for one month, then reduce and take 1 tablet (200 mg daily) ONCE daily What changed:   how much to take  how to take this  when to take this   apixaban 5 MG Tabs tablet Commonly known as: Eliquis Take 1 tablet (5 mg total) by mouth 2 (two) times daily.   atorvastatin 80 MG tablet Commonly known as: LIPITOR Take 80 mg by mouth at bedtime.   EpiPen 2-Pak 0.3 mg/0.3 mL Soaj injection Generic drug: EPINEPHrine Inject 0.3 mg into the muscle daily as needed for anaphylaxis. USE ONLY IN EMERGENCY   fluticasone 50 MCG/ACT nasal spray Commonly known as: FLONASE Place 1 spray into the nose daily as needed for rhinitis or allergies.   furosemide 20 MG tablet Commonly known as: LASIX Take 1 tablet (20 mg total) by mouth daily as needed for fluid or edema.   ibuprofen 200 MG tablet Commonly known as: ADVIL Take 400 mg by mouth every 6 (six) hours as needed for headache.   Magnesium 250 MG Tabs Take 250 mg by mouth  daily.   metoprolol succinate 50 MG 24 hr tablet Commonly known as: TOPROL-XL Take 1 tablet (50 mg total) by mouth 2 (two) times daily. Take with or immediately following a meal. What changed: when to take this   multivitamin with minerals Tabs tablet Take 1 tablet by mouth daily. Centrum Silver   nateglinide 120 MG tablet Commonly known as: STARLIX Take 120 mg by mouth Twice daily.   pioglitazone 45 MG tablet Commonly known as: ACTOS Take 45 mg by mouth daily.   polyethylene glycol 17 g packet Commonly known as: MIRALAX / GLYCOLAX Take 17 g by mouth daily as needed for mild constipation.   sitaGLIPtin 100 MG tablet Commonly known as: JANUVIA Take 100 mg by mouth at bedtime.   Tiotropium Bromide-Olodaterol 2.5-2.5 MCG/ACT Aers Commonly known as: Stiolto Respimat Inhale 2 puffs into the lungs daily.   Vitamin D-3 125 MCG (5000 UT) Tabs Take 5,000-10,000 Units by mouth See admin instructions. Take 5000 units by mouth on Sunday, Tuesday, Thursday, and Saturday. Take 10000 units by mouth on Monday, Wednesday, and Friday       Discharge Assessment: Vitals:   11/16/18 0852 11/16/18 1219  BP: 119/71 112/82  Pulse: 63 61  Resp:  18  Temp: 97.9 F (36.6 C) 98.1 F (36.7 C)  SpO2: 100% 99%   Skin clean, dry and intact  without evidence of skin break down, no evidence of skin tears noted. IV catheter discontinued intact. Site without signs and symptoms of complications - no redness or edema noted at insertion site, patient denies c/o pain - only slight tenderness at site.  Dressing with slight pressure applied.  D/c Instructions-Education: Discharge instructions given to patient/family with verbalized understanding. D/c education completed with patient/family including follow up instructions, medication list, d/c activities limitations if indicated, with other d/c instructions as indicated by MD - patient able to verbalize understanding, all questions fully answered. Patient  instructed to return to ED, call 911, or call MD for any changes in condition.  Patient escorted via [place], and D/C home via private auto.  Atilano Ina, RN 11/16/2018 1:52 PM

## 2018-11-16 NOTE — Discharge Summary (Signed)
Physician Discharge Summary  [name] [id number] DOB: 10/02/1931 DOA: 11/11/2018  PCP: [name], MD  Admit date: 11/11/2018 Discharge date: 11/16/2018  Admitted From: Home Discharged to: [place]: None, outpatient PT Equipment/Devices: None Discharge Condition: Stable  Recommendations for Outpatient Follow-up    1. Follow up with PCP in 1-2 weeks 2. Please follow up CMP/CBC  3. Follow-up on sodium, if patient has recurrence of hyponatremia will need referral to nephrology outpatient 4. Follow-up on elevated bilirubin  Medication Adjustments at Discharge  1. None   Hospital Summary  This is an 85 year old male with past medical history of atrial fibrillation, diabetes, sick sinus syndrome, systolic heart failure status post AICD who presented with 2 days of increasing shortness of breath, orthopnea and PND, recent right CEA on 10/8.  He had an elevated BNP of 1295 and hyponatremia with bilateral pleural effusions at presentation on 10/12.  He was initially treated for an acute heart failure exacerbation with diuresis and had improvement in his volume status.  Patient was evaluated by vascular surgery for extensive bruising and underwent CT angio of his neck which demonstrated possible thrombus and more likely residual stenosis proximally in common carotid artery and was not concern for flow-limiting occlusion on the right side.  Patient was restarted on his Eliquis.    As his volume status improved his sodium began to downtrend with a lowest point of 114.  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  He was started on tolvaptan and had fluid restriction with improvement in symptoms and sodium.  Patient was discharged in stable condition with instruction to follow-up with lab work with PCP.  He has recurrence of hyponatremia he will need to follow-up with the nephrology office.  Of note,  patient did have asymptomatic elevated bilirubin, possibly from breakdown of blood at his surgical site.  He has follow-up CMP.  Consider RUQ ultrasound outpatient    Principal Problem:   Acute on chronic combined systolic (congestive) and diastolic (congestive) heart failure (HCC) Active Problems:   ATRIAL FIBRILLATION   Asthma-COPD overlap syndrome (HCC)   Type 2 diabetes mellitus with complication, without long-term current use of insulin (HCC)   CAD (coronary artery disease)   Hyponatremia   AICD (automatic cardioverter/defibrillator) present     Code Status: Full Code Diet recommendation: Fluid restriction until following up with PCP  Consultants  . Vascular surgery . Nephrology  Procedures  . CT head and angio neck    Subjective  Patient seen and examined at bedside no acute distress and resting comfortably.  No events overnight.  Tolerating diet. In good spirits and anticipating discharge.   Denies any chest pain, shortness of breath, fever, nausea, vomiting, urinary or bowel complaints. Otherwise ROS negative   Objective   Discharge Exam: Vitals:   11/16/18 0852 11/16/18 1219  BP: 119/71 112/82  Pulse: 63 61  Resp:  18  Temp: 97.9 F (36.6 C) 98.1 F (36.7 C)  SpO2: 100% 99%   Vitals:   11/16/18 0006 11/16/18 0600 11/16/18 0852 11/16/18 1219  BP: 114/67  119/71 112/82  Pulse: 63  63 61  Resp: 16   18  Temp: 98 F (36.7 C)  97.9 F (36.6 C) 98.1 F (36.7 C)  TempSrc: Oral  Oral Oral  SpO2: 95%  100% 99%  Weight:  88.2 kg    Height:        Physical Exam Vitals signs and nursing  note reviewed.  Constitutional:      Appearance: Normal appearance.  HENT:     Head: Normocephalic.     Nose: Nose normal.     Mouth/Throat:     Mouth: Mucous membranes are moist.  Eyes:     Extraocular Movements: Extraocular movements intact.  Neck:     Musculoskeletal: Normal range of motion. No neck rigidity.     Comments: Healing right-sided postsurgical site  with improving ecchymosis Cardiovascular:     Rate and Rhythm: Normal rate and regular rhythm.  Pulmonary:     Effort: Pulmonary effort is normal.     Breath sounds: Normal breath sounds.  Abdominal:     General: Abdomen is flat.     Palpations: Abdomen is soft.  Musculoskeletal: Normal range of motion.        General: No swelling.  Neurological:     General: No focal deficit present.     Mental Status: He is alert. Mental status is at baseline.  Psychiatric:        Mood and Affect: Mood normal.        Behavior: Behavior normal.       The results of significant diagnostics from this hospitalization (including imaging, microbiology, ancillary and laboratory) are listed below for reference.     Microbiology: Recent Results (from the past 240 hour(s))  MRSA PCR Screening     Status: None   Collection Time: 11/07/18  2:47 AM   Specimen: Nasal Mucosa; Nasopharyngeal  Result Value Ref Range Status   MRSA by PCR NEGATIVE NEGATIVE Final    Comment:        The GeneXpert MRSA Assay (FDA approved for NASAL specimens only), is one component of a comprehensive MRSA colonization surveillance program. It is not intended to diagnose MRSA infection nor to guide or monitor treatment for MRSA infections. Performed at [facility], [place] [address] [place], [place] [address]   SARS CORONAVIRUS 2 (TAT 6-24 HRS) Nasopharyngeal Nasopharyngeal Swab     Status: None   Collection Time: 11/11/18  3:03 PM   Specimen: Nasopharyngeal Swab  Result Value Ref Range Status   SARS Coronavirus 2 NEGATIVE NEGATIVE Final    Comment: (NOTE) SARS-CoV-2 target nucleic acids are NOT DETECTED. The SARS-CoV-2 RNA is generally detectable in upper and lower respiratory specimens during the acute phase of infection. Negative results do not preclude SARS-CoV-2 infection, do not rule out co-infections with other pathogens, and should not be used as the sole basis for treatment or other patient management  decisions. Negative results must be combined with clinical observations, patient history, and epidemiological information. The expected result is Negative. Fact Sheet for Patients: SugarRoll.be Fact Sheet for Healthcare Providers: https://www.woods-mathews.com/ This test is not yet approved or cleared by the [place] FDA and  has been authorized for detection and/or diagnosis of SARS-CoV-2 by FDA under an Emergency Use Authorization (EUA). This EUA will remain  in effect (meaning this test can be used) for the duration of the COVID-19 declaration under Section 56 4(b)(1) of the Act, 21 U.S.C. section 360bbb-3(b)(1), unless the authorization is terminated or revoked sooner. Performed at [facility], [place] [address], [place], [place] [address]      Labs: BNP (last 3 results) Recent Labs    03/04/18 0030 11/11/18 1240  BNP 219.8* 0000000*   Basic Metabolic Panel: Recent Labs  Lab 11/14/18 0229 11/14/18 0520 11/14/18 1114  11/14/18 2238 11/15/18 0357 11/15/18 [id number] 11/15/18 1446 11/16/18 0529  NA 116* 117* 119*   < > 126* 129* 130* 131* 131*  K 3.4* 3.4* 3.6  --   --   --  3.9 4.2 3.7  CL 80* 83* 81*  --   --   --  93* 92* 94*  CO2 23 20* 26  --   --   --  27 30 26   GLUCOSE 128* 123* 152*  --   --   --  148* 165* 142*  BUN 8 10 10   --   --   --  12 14 18   CREATININE 0.92 0.92 0.99  --   --   --  1.12 1.10 1.17  CALCIUM 8.5* 8.5* 8.8*  --   --   --  9.0 9.2 8.9  PHOS 3.7  --   --   --   --   --   --   --   --    < > = values in this interval not displayed.   Liver Function Tests: Recent Labs  Lab 11/12/18 0847 11/13/18 0620 11/14/18 0229 11/15/18 0657 11/16/18 0529  AST 29 27  --  37 35  ALT 21 22  --  33 33  ALKPHOS 91 89  --  91 94  BILITOT 3.3* 3.6*  --  3.4* 2.2*  PROT 6.6 6.4*  --  6.8 6.3*  ALBUMIN 3.4* 3.4* 3.3* 3.7 3.4*   No results for input(s): LIPASE, AMYLASE in the last 168 hours. No results  for input(s): AMMONIA in the last 168 hours. CBC: Recent Labs  Lab 11/11/18 1240 11/13/18 0620 11/14/18 0229 11/15/18 0657 11/16/18 0529  WBC 6.9 7.0 6.9 6.5 7.2  HGB 12.2* 12.7* 12.3* 13.9 13.1  HCT 33.9* 34.5* 33.5* 39.0 36.8*  MCV 98.8 94.5 94.1 97.0 97.6  PLT 144* 170 176 186 160   Cardiac Enzymes: No results for input(s): CKTOTAL, CKMB, CKMBINDEX, TROPONINI in the last 168 hours. BNP: Invalid input(s): POCBNP CBG: Recent Labs  Lab 11/15/18 1136 11/15/18 1626 11/15/18 2157 11/16/18 0600 11/16/18 1216  GLUCAP 172* 144* 185* 147* 176*   D-Dimer No results for input(s): DDIMER in the last 72 hours. Hgb A1c No results for input(s): HGBA1C in the last 72 hours. Lipid Profile No results for input(s): CHOL, HDL, LDLCALC, TRIG, CHOLHDL, LDLDIRECT in the last 72 hours. Thyroid function studies No results for input(s): TSH, T4TOTAL, T3FREE, THYROIDAB in the last 72 hours.  Invalid input(s): FREET3 Anemia work up No results for input(s): VITAMINB12, FOLATE, FERRITIN, TIBC, IRON, RETICCTPCT in the last 72 hours. Urinalysis    Component Value Date/Time   COLORURINE YELLOW 11/05/2018 0019   COLORURINE YELLOW 11/05/2018 0019   APPEARANCEUR CLEAR 11/05/2018 0019   APPEARANCEUR CLEAR 11/05/2018 0019   LABSPEC 1.018 11/05/2018 0019   LABSPEC 1.018 11/05/2018 0019   PHURINE 6.0 11/05/2018 0019   PHURINE 6.0 11/05/2018 0019   GLUCOSEU NEGATIVE 11/05/2018 0019   GLUCOSEU NEGATIVE 11/05/2018 0019   HGBUR NEGATIVE 11/05/2018 0019   HGBUR NEGATIVE 11/05/2018 0019   BILIRUBINUR NEGATIVE 11/05/2018 0019   BILIRUBINUR NEGATIVE 11/05/2018 0019   KETONESUR NEGATIVE 11/05/2018 0019   KETONESUR NEGATIVE 11/05/2018 0019   PROTEINUR NEGATIVE 11/05/2018 0019   PROTEINUR NEGATIVE 11/05/2018 0019   NITRITE NEGATIVE 11/05/2018 0019   NITRITE NEGATIVE 11/05/2018 0019   LEUKOCYTESUR NEGATIVE 11/05/2018 0019   LEUKOCYTESUR NEGATIVE 11/05/2018 0019   Sepsis Labs Invalid input(s):  PROCALCITONIN,  WBC,  LACTICIDVEN Microbiology Recent Results (from the past 240 hour(s))  MRSA PCR  Screening     Status: None   Collection Time: 11/07/18  2:47 AM   Specimen: Nasal Mucosa; Nasopharyngeal  Result Value Ref Range Status   MRSA by PCR NEGATIVE NEGATIVE Final    Comment:        The GeneXpert MRSA Assay (FDA approved for NASAL specimens only), is one component of a comprehensive MRSA colonization surveillance program. It is not intended to diagnose MRSA infection nor to guide or monitor treatment for MRSA infections. Performed at [facility], [place] [address], [place], [place] [address]   SARS CORONAVIRUS 2 (TAT 6-24 HRS) Nasopharyngeal Nasopharyngeal Swab     Status: None   Collection Time: 11/11/18  3:03 PM   Specimen: Nasopharyngeal Swab  Result Value Ref Range Status   SARS Coronavirus 2 NEGATIVE NEGATIVE Final    Comment: (NOTE) SARS-CoV-2 target nucleic acids are NOT DETECTED. The SARS-CoV-2 RNA is generally detectable in upper and lower respiratory specimens during the acute phase of infection. Negative results do not preclude SARS-CoV-2 infection, do not rule out co-infections with other pathogens, and should not be used as the sole basis for treatment or other patient management decisions. Negative results must be combined with clinical observations, patient history, and epidemiological information. The expected result is Negative. Fact Sheet for Patients: SugarRoll.be Fact Sheet for Healthcare Providers: https://www.woods-mathews.com/ This test is not yet approved or cleared by the [place] FDA and  has been authorized for detection and/or diagnosis of SARS-CoV-2 by FDA under an Emergency Use Authorization (EUA). This EUA will remain  in effect (meaning this test can be used) for the duration of the COVID-19 declaration under Section 56 4(b)(1) of the Act, 21 U.S.C. section 360bbb-3(b)(1), unless the  authorization is terminated or revoked sooner. Performed at [facility], [place] [address], [place], [place] [address]     Discharge Instructions     Discharge Instructions    Diet - low sodium heart healthy   Complete by: As directed    Fluid restriction until physician clearance   Discharge instructions   Complete by: As directed    You were seen and examined in the hospital for low sodium and cared for by a hospitalist and nephrologist  Make an appointment to see your primary physician for follow up within 7 days of hospital discharge.  Get lab work prior to your follow up appointment Bring all home medications to your appointment to review Request that your primary physician go over all hospital tests and procedures/radiological results at the follow up.   Please get all hospital records sent to your physician by signing a hospital release before you go home.  Medication adjustments at discharge: None  Read the complete instructions along with all the possible side effects for all the medicines you take and that have been prescribed to you. Take any new medicines after you have completely understood and accept all the possible adverse reactions/side effects.   If you have any questions about your discharge medications or the care you received while you were in the hospital, you can call the unit and asked to speak with the hospitalist on call. Once you are discharged, your primary care physician will handle any further medical issues. Please note that NO REFILLS for any discharge medications will be authorized, as it is imperative that you return to your primary care physician (or establish a relationship with a primary care physician if you do not have one) for your aftercare needs so that they  can reassess your need for medications and monitor your lab values.   Do not drive, operate heavy machinery, perform activities at heights, swimming or participation in water  activities or provide baby sitting services if your were admitted for loss of consciousness/seizures or if you are on sedating medications including, but not limited to benzodiazepines, sleep medications, narcotic pain medications, etc., until you have been cleared to do so by a medical doctor.   Do not take more than prescribed medications.   Wear a seat belt while driving.  If you have smoked or chewed Tobacco in the last 2 years please stop smoking; also stop any regular Alcohol and/or any Recreational drug use including marijuana.  If you experience worsening of your admission symptoms or develop shortness of breath, chest pain, suicidal or homicidal thoughts or experience a life threatening emergency, you must seek medical attention immediately by calling 911 or calling your MD immediately.   Increase activity slowly   Complete by: As directed      Allergies as of 11/16/2018      Reactions   Latex Other (See Comments)   REDNESS AT REACTION SITE.   Metformin Diarrhea   Rivaroxaban Other (See Comments)   Headaches, blurred vision   Other Other (See Comments)   BEE STINGS   Sotalol Other (See Comments)   "BLUE TOES"- cut his circulation off   Warfarin Sodium Other (See Comments)   REACTION: migraine headaches/vision impariment          Follow-up Information    [name], MD.   Specialty: Family Medicine Why: Please follow up in a week Contact information: [place] [address] [phone]          Allergies  Allergen Reactions  . Latex Other (See Comments)    REDNESS AT REACTION SITE.  Marland Kitchen Metformin Diarrhea  . Rivaroxaban Other (See Comments)    Headaches, blurred vision  . Other Other (See Comments)    BEE STINGS   . Sotalol Other (See Comments)    "BLUE TOES"- cut his circulation off  . Warfarin Sodium Other (See Comments)    REACTION: migraine headaches/vision impariment    Time coordinating discharge: Over 30 minutes    SIGNED:   Harold Hedge, D.O. [facility] 11/16/2018, 12:23 PM

## 2018-11-20 NOTE — Progress Notes (Signed)
Remote ICD transmission.   

## 2018-11-22 DIAGNOSIS — E7800 Pure hypercholesterolemia, unspecified: Secondary | ICD-10-CM | POA: Diagnosis not present

## 2018-11-22 DIAGNOSIS — I509 Heart failure, unspecified: Secondary | ICD-10-CM | POA: Diagnosis not present

## 2018-11-22 DIAGNOSIS — E1159 Type 2 diabetes mellitus with other circulatory complications: Secondary | ICD-10-CM | POA: Diagnosis not present

## 2018-11-22 DIAGNOSIS — E871 Hypo-osmolality and hyponatremia: Secondary | ICD-10-CM | POA: Diagnosis not present

## 2018-11-22 DIAGNOSIS — J449 Chronic obstructive pulmonary disease, unspecified: Secondary | ICD-10-CM | POA: Diagnosis not present

## 2018-11-22 DIAGNOSIS — I2510 Atherosclerotic heart disease of native coronary artery without angina pectoris: Secondary | ICD-10-CM | POA: Diagnosis not present

## 2018-11-29 ENCOUNTER — Ambulatory Visit (INDEPENDENT_AMBULATORY_CARE_PROVIDER_SITE_OTHER): Payer: Self-pay | Admitting: Physician Assistant

## 2018-11-29 ENCOUNTER — Other Ambulatory Visit: Payer: Self-pay

## 2018-11-29 VITALS — BP 125/77 | HR 73 | Resp 18 | Ht 74.0 in | Wt 206.0 lb

## 2018-11-29 DIAGNOSIS — I6521 Occlusion and stenosis of right carotid artery: Secondary | ICD-10-CM

## 2018-11-29 NOTE — Progress Notes (Signed)
POST OPERATIVE OFFICE NOTE    CC:  85 year old male with high-grade carotid stenosis and 2 episodes of amaurosis within the past week.  He is now indicated for carotid endarterectomy.  HPI:  This is a 85 y.o. male who is s/p Right carotid endarterectomy with Dacron patch angioplasty on 11/07/2018 by Dr. Donzetta Matters.  He was discharged 11/08/2018 in stable condition with mild ecchymosis.  He returned to the ED on 11/11/2018 with complaints of shortness of breath particularly when lying flat.  He has also noticed a lot of ecchymosis to his neck and chest wall and flank.  He is on Eliquis for history of atrial fibrillation.  In the ED his BNP is 1200+.  Last echo showed an EF of 35 to 40% on 11/05/2018.  Does have a history of heart failure.  He was discharged home in stable condition on 11/16/2018.    He denise swallowing trouble, amaurosis and weakness in extremities.  The ecchymosis has resolved.    Allergies  Allergen Reactions  . Latex Other (See Comments)    REDNESS AT REACTION SITE.  Marland Kitchen Metformin Diarrhea  . Rivaroxaban Other (See Comments)    Headaches, blurred vision  . Other Other (See Comments)    BEE STINGS   . Sotalol Other (See Comments)    "BLUE TOES"- cut his circulation off  . Warfarin Sodium Other (See Comments)    REACTION: migraine headaches/vision impariment    Current Outpatient Medications  Medication Sig Dispense Refill  . acetaminophen (TYLENOL) 325 MG tablet Take 2 tablets (650 mg total) by mouth every 6 (six) hours as needed for mild pain (or Fever >/= 101). (Patient not taking: Reported on 11/11/2018)    . amiodarone (PACERONE) 200 MG tablet Take 1 tablet (200 mg total) TWICE daily for one month, then reduce and take 1 tablet (200 mg daily) ONCE daily (Patient taking differently: Take 200 mg by mouth See admin instructions. Take 1 tablet (200 mg total) TWICE daily for one month, then reduce and take 1 tablet (200 mg daily) ONCE daily) 60 tablet 6  . apixaban (ELIQUIS) 5  MG TABS tablet Take 1 tablet (5 mg total) by mouth 2 (two) times daily. 180 tablet 1  . atorvastatin (LIPITOR) 80 MG tablet Take 80 mg by mouth at bedtime.     . Cholecalciferol (VITAMIN D-3) 5000 UNITS TABS Take 5,000-10,000 Units by mouth See admin instructions. Take 5000 units by mouth on Sunday, Tuesday, Thursday, and Saturday. Take 10000 units by mouth on Monday, Wednesday, and Friday    . EPIPEN 2-PAK 0.3 MG/0.3ML SOAJ injection Inject 0.3 mg into the muscle daily as needed for anaphylaxis. USE ONLY IN EMERGENCY    . fluticasone (FLONASE) 50 MCG/ACT nasal spray Place 1 spray into the nose daily as needed for rhinitis or allergies.    . furosemide (LASIX) 20 MG tablet Take 1 tablet (20 mg total) by mouth daily as needed for fluid or edema.    Marland Kitchen ibuprofen (ADVIL) 200 MG tablet Take 400 mg by mouth every 6 (six) hours as needed for headache.    . Magnesium 250 MG TABS Take 250 mg by mouth daily.     . metoprolol succinate (TOPROL-XL) 50 MG 24 hr tablet Take 1 tablet (50 mg total) by mouth 2 (two) times daily. Take with or immediately following a meal. (Patient taking differently: Take 50 mg by mouth daily. Take with or immediately following a meal.) 180 tablet 3  . Multiple Vitamin (MULTIVITAMIN WITH MINERALS)  TABS Take 1 tablet by mouth daily. Centrum Silver    . nateglinide (STARLIX) 120 MG tablet Take 120 mg by mouth Twice daily.     . pioglitazone (ACTOS) 45 MG tablet Take 45 mg by mouth daily.      . polyethylene glycol (MIRALAX / GLYCOLAX) 17 g packet Take 17 g by mouth daily as needed for mild constipation. 14 each 0  . sitaGLIPtin (JANUVIA) 100 MG tablet Take 100 mg by mouth at bedtime.     . Tiotropium Bromide-Olodaterol (STIOLTO RESPIMAT) 2.5-2.5 MCG/ACT AERS Inhale 2 puffs into the lungs daily. 1 Inhaler 5   No current facility-administered medications for this visit.      ROS:  See HPI  Physical Exam:    Incision:  Well healed Extremities:  Moving all 4 extremities, grip 5/5  equal B UE/LE Neuro:  No neuro deficits Smile is symmetric and no tongue deviation.     Assessment/Plan:  This is a 85 y.o. male who is s/p:Right cea   He will f/u in 9 months for repeat carotid duplex study.  If has problems or symptoms of TIA or stroke he will call 911.   The patient was seen by Dr. Donzetta Matters today as well.  Roxy Horseman PA-C [facility] [phone]  Clinic MD:  Donzetta Matters

## 2018-12-02 ENCOUNTER — Other Ambulatory Visit: Payer: Self-pay

## 2018-12-02 ENCOUNTER — Other Ambulatory Visit: Payer: Self-pay | Admitting: *Deleted

## 2018-12-02 ENCOUNTER — Other Ambulatory Visit: Payer: Medicare Other

## 2018-12-02 ENCOUNTER — Other Ambulatory Visit: Payer: Medicare Other | Admitting: *Deleted

## 2018-12-02 DIAGNOSIS — I2510 Atherosclerotic heart disease of native coronary artery without angina pectoris: Secondary | ICD-10-CM

## 2018-12-03 LAB — CBC
Hematocrit: 38.4 % (ref 37.5–51.0)
Hemoglobin: 12.9 g/dL — ABNORMAL LOW (ref 13.0–17.7)
MCH: 33.9 pg — ABNORMAL HIGH (ref 26.6–33.0)
MCHC: 33.6 g/dL (ref 31.5–35.7)
MCV: 101 fL — ABNORMAL HIGH (ref 79–97)
Platelets: 125 10*3/uL — ABNORMAL LOW (ref 150–450)
RBC: 3.8 x10E6/uL — ABNORMAL LOW (ref 4.14–5.80)
RDW: 13.1 % (ref 11.6–15.4)
WBC: 5.4 10*3/uL (ref 3.4–10.8)

## 2018-12-03 LAB — BASIC METABOLIC PANEL
BUN/Creatinine Ratio: 10 (ref 10–24)
BUN: 10 mg/dL (ref 8–27)
CO2: 22 mmol/L (ref 20–29)
Calcium: 8.9 mg/dL (ref 8.6–10.2)
Chloride: 97 mmol/L (ref 96–106)
Creatinine, Ser: 0.98 mg/dL (ref 0.76–1.27)
GFR calc Af Amer: 81 mL/min/{1.73_m2} (ref >59)
GFR calc non Af Amer: 70 mL/min/{1.73_m2} (ref >59)
Glucose: 119 mg/dL — ABNORMAL HIGH (ref 65–99)
Potassium: 4.8 mmol/L (ref 3.5–5.2)
Sodium: 133 mmol/L — ABNORMAL LOW (ref 134–144)

## 2018-12-09 DIAGNOSIS — Z8673 Personal history of transient ischemic attack (TIA), and cerebral infarction without residual deficits: Secondary | ICD-10-CM | POA: Diagnosis not present

## 2018-12-09 DIAGNOSIS — G453 Amaurosis fugax: Secondary | ICD-10-CM | POA: Diagnosis not present

## 2018-12-09 DIAGNOSIS — Z7984 Long term (current) use of oral hypoglycemic drugs: Secondary | ICD-10-CM | POA: Diagnosis not present

## 2018-12-09 DIAGNOSIS — I480 Paroxysmal atrial fibrillation: Secondary | ICD-10-CM | POA: Diagnosis not present

## 2018-12-09 DIAGNOSIS — I7300 Raynaud's syndrome without gangrene: Secondary | ICD-10-CM | POA: Diagnosis not present

## 2018-12-09 DIAGNOSIS — I5022 Chronic systolic (congestive) heart failure: Secondary | ICD-10-CM | POA: Diagnosis not present

## 2018-12-09 DIAGNOSIS — I472 Ventricular tachycardia: Secondary | ICD-10-CM | POA: Diagnosis not present

## 2018-12-09 DIAGNOSIS — Z8781 Personal history of (healed) traumatic fracture: Secondary | ICD-10-CM | POA: Diagnosis not present

## 2018-12-09 DIAGNOSIS — Z95810 Presence of automatic (implantable) cardiac defibrillator: Secondary | ICD-10-CM | POA: Diagnosis not present

## 2018-12-09 DIAGNOSIS — Z85828 Personal history of other malignant neoplasm of skin: Secondary | ICD-10-CM | POA: Diagnosis not present

## 2018-12-09 DIAGNOSIS — R55 Syncope and collapse: Secondary | ICD-10-CM | POA: Diagnosis not present

## 2018-12-09 DIAGNOSIS — Z7901 Long term (current) use of anticoagulants: Secondary | ICD-10-CM | POA: Diagnosis not present

## 2018-12-09 DIAGNOSIS — J449 Chronic obstructive pulmonary disease, unspecified: Secondary | ICD-10-CM | POA: Diagnosis not present

## 2018-12-09 DIAGNOSIS — Z951 Presence of aortocoronary bypass graft: Secondary | ICD-10-CM | POA: Diagnosis not present

## 2018-12-09 DIAGNOSIS — R21 Rash and other nonspecific skin eruption: Secondary | ICD-10-CM | POA: Diagnosis not present

## 2018-12-09 DIAGNOSIS — I25118 Atherosclerotic heart disease of native coronary artery with other forms of angina pectoris: Secondary | ICD-10-CM | POA: Diagnosis not present

## 2018-12-09 DIAGNOSIS — I4891 Unspecified atrial fibrillation: Secondary | ICD-10-CM | POA: Diagnosis not present

## 2018-12-09 DIAGNOSIS — E119 Type 2 diabetes mellitus without complications: Secondary | ICD-10-CM | POA: Diagnosis not present

## 2018-12-11 DIAGNOSIS — Z7984 Long term (current) use of oral hypoglycemic drugs: Secondary | ICD-10-CM | POA: Diagnosis not present

## 2018-12-11 DIAGNOSIS — Z7901 Long term (current) use of anticoagulants: Secondary | ICD-10-CM | POA: Diagnosis not present

## 2018-12-11 DIAGNOSIS — I25118 Atherosclerotic heart disease of native coronary artery with other forms of angina pectoris: Secondary | ICD-10-CM | POA: Diagnosis not present

## 2018-12-11 DIAGNOSIS — I5022 Chronic systolic (congestive) heart failure: Secondary | ICD-10-CM | POA: Diagnosis not present

## 2018-12-11 DIAGNOSIS — E119 Type 2 diabetes mellitus without complications: Secondary | ICD-10-CM | POA: Diagnosis not present

## 2018-12-11 DIAGNOSIS — I480 Paroxysmal atrial fibrillation: Secondary | ICD-10-CM | POA: Diagnosis not present
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
## 2018-12-13 ENCOUNTER — Ambulatory Visit (INDEPENDENT_AMBULATORY_CARE_PROVIDER_SITE_OTHER): Payer: Medicare Other | Admitting: Neurology

## 2018-12-13 ENCOUNTER — Encounter: Payer: Self-pay | Admitting: Neurology

## 2018-12-13 ENCOUNTER — Other Ambulatory Visit: Payer: Self-pay

## 2018-12-13 VITALS — BP 122/80 | HR 79 | Ht 74.0 in | Wt 208.6 lb

## 2018-12-13 DIAGNOSIS — I4821 Permanent atrial fibrillation: Secondary | ICD-10-CM | POA: Diagnosis not present

## 2018-12-13 DIAGNOSIS — G43009 Migraine without aura, not intractable, without status migrainosus: Secondary | ICD-10-CM

## 2018-12-13 DIAGNOSIS — I255 Ischemic cardiomyopathy: Secondary | ICD-10-CM | POA: Diagnosis not present

## 2018-12-13 DIAGNOSIS — E785 Hyperlipidemia, unspecified: Secondary | ICD-10-CM

## 2018-12-13 DIAGNOSIS — G453 Amaurosis fugax: Secondary | ICD-10-CM

## 2018-12-13 DIAGNOSIS — E118 Type 2 diabetes mellitus with unspecified complications: Secondary | ICD-10-CM | POA: Diagnosis not present

## 2018-12-13 DIAGNOSIS — I6521 Occlusion and stenosis of right carotid artery: Secondary | ICD-10-CM

## 2018-12-13 NOTE — Patient Instructions (Signed)
1.  Follow up in 5 months

## 2018-12-19 DIAGNOSIS — I10 Essential (primary) hypertension: Secondary | ICD-10-CM | POA: Diagnosis not present

## 2018-12-19 DIAGNOSIS — I255 Ischemic cardiomyopathy: Secondary | ICD-10-CM | POA: Diagnosis not present

## 2018-12-19 DIAGNOSIS — E871 Hypo-osmolality and hyponatremia: Secondary | ICD-10-CM | POA: Diagnosis not present

## 2018-12-19 DIAGNOSIS — I6529 Occlusion and stenosis of unspecified carotid artery: Secondary | ICD-10-CM | POA: Diagnosis not present

## 2018-12-19 DIAGNOSIS — Z95810 Presence of automatic (implantable) cardiac defibrillator: Secondary | ICD-10-CM | POA: Diagnosis not present

## 2018-12-19 DIAGNOSIS — E118 Type 2 diabetes mellitus with unspecified complications: Secondary | ICD-10-CM | POA: Diagnosis not present

## 2018-12-19 DIAGNOSIS — I480 Paroxysmal atrial fibrillation: Secondary | ICD-10-CM | POA: Diagnosis not present

## 2018-12-25 ENCOUNTER — Inpatient Hospital Stay: Payer: BLUE CROSS/BLUE SHIELD | Admitting: Adult Health

## 2019-01-01 DIAGNOSIS — R42 Dizziness and giddiness: Secondary | ICD-10-CM | POA: Diagnosis not present

## 2019-01-03 ENCOUNTER — Inpatient Hospital Stay (HOSPITAL_BASED_OUTPATIENT_CLINIC_OR_DEPARTMENT_OTHER)
Admission: EM | Admit: 2019-01-03 | Discharge: 2019-01-14 | DRG: 417 | Disposition: A | Discharge status: Home Health | Payer: Medicare Other | Attending: Internal Medicine | Admitting: Internal Medicine

## 2019-01-03 ENCOUNTER — Emergency Department (HOSPITAL_BASED_OUTPATIENT_CLINIC_OR_DEPARTMENT_OTHER): Payer: Medicare Other

## 2019-01-03 ENCOUNTER — Encounter (HOSPITAL_BASED_OUTPATIENT_CLINIC_OR_DEPARTMENT_OTHER): Payer: Self-pay

## 2019-01-03 ENCOUNTER — Other Ambulatory Visit: Payer: Self-pay

## 2019-01-03 DIAGNOSIS — I25118 Atherosclerotic heart disease of native coronary artery with other forms of angina pectoris: Secondary | ICD-10-CM | POA: Diagnosis not present

## 2019-01-03 DIAGNOSIS — D509 Iron deficiency anemia, unspecified: Secondary | ICD-10-CM | POA: Diagnosis present

## 2019-01-03 DIAGNOSIS — K8050 Calculus of bile duct without cholangitis or cholecystitis without obstruction: Secondary | ICD-10-CM | POA: Diagnosis not present

## 2019-01-03 DIAGNOSIS — E222 Syndrome of inappropriate secretion of antidiuretic hormone: Secondary | ICD-10-CM | POA: Diagnosis present

## 2019-01-03 DIAGNOSIS — I480 Paroxysmal atrial fibrillation: Secondary | ICD-10-CM | POA: Diagnosis present

## 2019-01-03 DIAGNOSIS — E118 Type 2 diabetes mellitus with unspecified complications: Secondary | ICD-10-CM | POA: Diagnosis present

## 2019-01-03 DIAGNOSIS — K8020 Calculus of gallbladder without cholecystitis without obstruction: Secondary | ICD-10-CM | POA: Diagnosis not present

## 2019-01-03 DIAGNOSIS — Z7901 Long term (current) use of anticoagulants: Secondary | ICD-10-CM | POA: Diagnosis not present

## 2019-01-03 DIAGNOSIS — R079 Chest pain, unspecified: Secondary | ICD-10-CM | POA: Diagnosis not present

## 2019-01-03 DIAGNOSIS — G43909 Migraine, unspecified, not intractable, without status migrainosus: Secondary | ICD-10-CM | POA: Diagnosis present

## 2019-01-03 DIAGNOSIS — I5043 Acute on chronic combined systolic (congestive) and diastolic (congestive) heart failure: Secondary | ICD-10-CM | POA: Diagnosis not present

## 2019-01-03 DIAGNOSIS — K858 Other acute pancreatitis without necrosis or infection: Secondary | ICD-10-CM

## 2019-01-03 DIAGNOSIS — Z79899 Other long term (current) drug therapy: Secondary | ICD-10-CM

## 2019-01-03 DIAGNOSIS — E871 Hypo-osmolality and hyponatremia: Secondary | ICD-10-CM | POA: Diagnosis not present

## 2019-01-03 DIAGNOSIS — D539 Nutritional anemia, unspecified: Secondary | ICD-10-CM | POA: Diagnosis present

## 2019-01-03 DIAGNOSIS — I7300 Raynaud's syndrome without gangrene: Secondary | ICD-10-CM | POA: Diagnosis present

## 2019-01-03 DIAGNOSIS — Z20828 Contact with and (suspected) exposure to other viral communicable diseases: Secondary | ICD-10-CM | POA: Diagnosis present

## 2019-01-03 DIAGNOSIS — R1084 Generalized abdominal pain: Secondary | ICD-10-CM

## 2019-01-03 DIAGNOSIS — I4821 Permanent atrial fibrillation: Secondary | ICD-10-CM | POA: Diagnosis not present

## 2019-01-03 DIAGNOSIS — K859 Acute pancreatitis without necrosis or infection, unspecified: Secondary | ICD-10-CM | POA: Diagnosis present

## 2019-01-03 DIAGNOSIS — Z91030 Bee allergy status: Secondary | ICD-10-CM

## 2019-01-03 DIAGNOSIS — I472 Ventricular tachycardia: Secondary | ICD-10-CM | POA: Diagnosis present

## 2019-01-03 DIAGNOSIS — I4819 Other persistent atrial fibrillation: Secondary | ICD-10-CM | POA: Diagnosis present

## 2019-01-03 DIAGNOSIS — Z8774 Personal history of (corrected) congenital malformations of heart and circulatory system: Secondary | ICD-10-CM

## 2019-01-03 DIAGNOSIS — Z833 Family history of diabetes mellitus: Secondary | ICD-10-CM | POA: Diagnosis not present

## 2019-01-03 DIAGNOSIS — Z8719 Personal history of other diseases of the digestive system: Secondary | ICD-10-CM | POA: Diagnosis not present

## 2019-01-03 DIAGNOSIS — E1151 Type 2 diabetes mellitus with diabetic peripheral angiopathy without gangrene: Secondary | ICD-10-CM | POA: Diagnosis not present

## 2019-01-03 DIAGNOSIS — Z91040 Latex allergy status: Secondary | ICD-10-CM | POA: Diagnosis not present

## 2019-01-03 DIAGNOSIS — Z823 Family history of stroke: Secondary | ICD-10-CM

## 2019-01-03 DIAGNOSIS — R7989 Other specified abnormal findings of blood chemistry: Secondary | ICD-10-CM | POA: Diagnosis not present

## 2019-01-03 DIAGNOSIS — K8010 Calculus of gallbladder with chronic cholecystitis without obstruction: Secondary | ICD-10-CM | POA: Diagnosis not present

## 2019-01-03 DIAGNOSIS — R933 Abnormal findings on diagnostic imaging of other parts of digestive tract: Secondary | ICD-10-CM | POA: Diagnosis not present

## 2019-01-03 DIAGNOSIS — I5021 Acute systolic (congestive) heart failure: Secondary | ICD-10-CM

## 2019-01-03 DIAGNOSIS — Z87891 Personal history of nicotine dependence: Secondary | ICD-10-CM

## 2019-01-03 DIAGNOSIS — D696 Thrombocytopenia, unspecified: Secondary | ICD-10-CM | POA: Diagnosis present

## 2019-01-03 DIAGNOSIS — Z825 Family history of asthma and other chronic lower respiratory diseases: Secondary | ICD-10-CM | POA: Diagnosis not present

## 2019-01-03 DIAGNOSIS — I25119 Atherosclerotic heart disease of native coronary artery with unspecified angina pectoris: Secondary | ICD-10-CM | POA: Diagnosis not present

## 2019-01-03 DIAGNOSIS — R9431 Abnormal electrocardiogram [ECG] [EKG]: Secondary | ICD-10-CM

## 2019-01-03 DIAGNOSIS — Z8249 Family history of ischemic heart disease and other diseases of the circulatory system: Secondary | ICD-10-CM

## 2019-01-03 DIAGNOSIS — Z888 Allergy status to other drugs, medicaments and biological substances status: Secondary | ICD-10-CM

## 2019-01-03 DIAGNOSIS — K36 Other appendicitis: Secondary | ICD-10-CM | POA: Diagnosis present

## 2019-01-03 DIAGNOSIS — D121 Benign neoplasm of appendix: Secondary | ICD-10-CM | POA: Diagnosis not present

## 2019-01-03 DIAGNOSIS — K219 Gastro-esophageal reflux disease without esophagitis: Secondary | ICD-10-CM | POA: Diagnosis not present

## 2019-01-03 DIAGNOSIS — T40605A Adverse effect of unspecified narcotics, initial encounter: Secondary | ICD-10-CM | POA: Diagnosis not present

## 2019-01-03 DIAGNOSIS — E876 Hypokalemia: Secondary | ICD-10-CM | POA: Diagnosis not present

## 2019-01-03 DIAGNOSIS — Z801 Family history of malignant neoplasm of trachea, bronchus and lung: Secondary | ICD-10-CM

## 2019-01-03 DIAGNOSIS — I714 Abdominal aortic aneurysm, without rupture, unspecified: Secondary | ICD-10-CM

## 2019-01-03 DIAGNOSIS — Z8673 Personal history of transient ischemic attack (TIA), and cerebral infarction without residual deficits: Secondary | ICD-10-CM

## 2019-01-03 DIAGNOSIS — K831 Obstruction of bile duct: Secondary | ICD-10-CM | POA: Diagnosis not present

## 2019-01-03 DIAGNOSIS — Z803 Family history of malignant neoplasm of breast: Secondary | ICD-10-CM | POA: Diagnosis not present

## 2019-01-03 DIAGNOSIS — I4891 Unspecified atrial fibrillation: Secondary | ICD-10-CM | POA: Diagnosis present

## 2019-01-03 DIAGNOSIS — K851 Biliary acute pancreatitis without necrosis or infection: Secondary | ICD-10-CM | POA: Diagnosis not present

## 2019-01-03 DIAGNOSIS — Z951 Presence of aortocoronary bypass graft: Secondary | ICD-10-CM

## 2019-01-03 DIAGNOSIS — I4510 Unspecified right bundle-branch block: Secondary | ICD-10-CM | POA: Diagnosis present

## 2019-01-03 DIAGNOSIS — Z95810 Presence of automatic (implantable) cardiac defibrillator: Secondary | ICD-10-CM | POA: Diagnosis not present

## 2019-01-03 DIAGNOSIS — K3580 Unspecified acute appendicitis: Secondary | ICD-10-CM | POA: Diagnosis not present

## 2019-01-03 DIAGNOSIS — K660 Peritoneal adhesions (postprocedural) (postinfection): Secondary | ICD-10-CM | POA: Diagnosis present

## 2019-01-03 DIAGNOSIS — R0902 Hypoxemia: Secondary | ICD-10-CM | POA: Diagnosis not present

## 2019-01-03 DIAGNOSIS — Z419 Encounter for procedure for purposes other than remedying health state, unspecified: Secondary | ICD-10-CM

## 2019-01-03 DIAGNOSIS — R109 Unspecified abdominal pain: Secondary | ICD-10-CM | POA: Diagnosis not present

## 2019-01-03 DIAGNOSIS — K4021 Bilateral inguinal hernia, without obstruction or gangrene, recurrent: Secondary | ICD-10-CM | POA: Diagnosis not present

## 2019-01-03 DIAGNOSIS — K8065 Calculus of gallbladder and bile duct with chronic cholecystitis with obstruction: Secondary | ICD-10-CM | POA: Diagnosis not present

## 2019-01-03 DIAGNOSIS — R748 Abnormal levels of other serum enzymes: Secondary | ICD-10-CM | POA: Diagnosis not present

## 2019-01-03 DIAGNOSIS — Z7984 Long term (current) use of oral hypoglycemic drugs: Secondary | ICD-10-CM

## 2019-01-03 LAB — CBC WITH DIFFERENTIAL/PLATELET
Abs Immature Granulocytes: 0.04 10*3/uL (ref 0.00–0.07)
Basophils Absolute: 0 10*3/uL (ref 0.0–0.1)
Basophils Relative: 0 %
Eosinophils Absolute: 0.2 10*3/uL (ref 0.0–0.5)
Eosinophils Relative: 3 %
HCT: 43.6 % (ref 39.0–52.0)
Hemoglobin: 14.4 g/dL (ref 13.0–17.0)
Immature Granulocytes: 0 %
Lymphocytes Relative: 3 %
Lymphs Abs: 0.3 10*3/uL — ABNORMAL LOW (ref 0.7–4.0)
MCH: 34.1 pg — ABNORMAL HIGH (ref 26.0–34.0)
MCHC: 33 g/dL (ref 30.0–36.0)
MCV: 103.3 fL — ABNORMAL HIGH (ref 80.0–100.0)
Monocytes Absolute: 0.6 10*3/uL (ref 0.1–1.0)
Monocytes Relative: 7 %
Neutro Abs: 7.8 10*3/uL — ABNORMAL HIGH (ref 1.7–7.7)
Neutrophils Relative %: 87 %
Platelets: 138 10*3/uL — ABNORMAL LOW (ref 150–400)
RBC: 4.22 MIL/uL (ref 4.22–5.81)
RDW: 14.1 % (ref 11.5–15.5)
WBC: 9 10*3/uL (ref 4.0–10.5)
nRBC: 0 % (ref 0.0–0.2)

## 2019-01-03 LAB — URINALYSIS, ROUTINE W REFLEX MICROSCOPIC
Glucose, UA: 250 mg/dL — AB
Hgb urine dipstick: NEGATIVE
Ketones, ur: 15 mg/dL — AB
Leukocytes,Ua: NEGATIVE
Nitrite: NEGATIVE
Protein, ur: 100 mg/dL — AB
Specific Gravity, Urine: >1.03 — ABNORMAL HIGH (ref 1.005–1.030)
pH: 6 (ref 5.0–8.0)

## 2019-01-03 LAB — LIPASE, BLOOD: Lipase: 2777 U/L — ABNORMAL HIGH (ref 11–51)

## 2019-01-03 LAB — COMPREHENSIVE METABOLIC PANEL
ALT: 250 U/L — ABNORMAL HIGH (ref 0–44)
AST: 224 U/L — ABNORMAL HIGH (ref 15–41)
Albumin: 4.2 g/dL (ref 3.5–5.0)
Alkaline Phosphatase: 253 U/L — ABNORMAL HIGH (ref 38–126)
Anion gap: 10 (ref 5–15)
BUN: 17 mg/dL (ref 8–23)
CO2: 23 mmol/L (ref 22–32)
Calcium: 8.8 mg/dL — ABNORMAL LOW (ref 8.9–10.3)
Chloride: 95 mmol/L — ABNORMAL LOW (ref 98–111)
Creatinine, Ser: 0.72 mg/dL (ref 0.61–1.24)
GFR calc Af Amer: >60 mL/min (ref >60)
GFR calc non Af Amer: >60 mL/min (ref >60)
Glucose, Bld: 171 mg/dL — ABNORMAL HIGH (ref 70–99)
Potassium: 4 mmol/L (ref 3.5–5.1)
Sodium: 128 mmol/L — ABNORMAL LOW (ref 135–145)
Total Bilirubin: 3.1 mg/dL — ABNORMAL HIGH (ref 0.3–1.2)
Total Protein: 7.5 g/dL (ref 6.5–8.1)

## 2019-01-03 LAB — TROPONIN I (HIGH SENSITIVITY)
Troponin I (High Sensitivity): 10 ng/L (ref <18)
Troponin I (High Sensitivity): 11 ng/L (ref <18)

## 2019-01-03 LAB — URINALYSIS, MICROSCOPIC (REFLEX): WBC, UA: NONE SEEN WBC/hpf (ref 0–5)

## 2019-01-03 LAB — BRAIN NATRIURETIC PEPTIDE: B Natriuretic Peptide: 295 pg/mL — ABNORMAL HIGH (ref 0.0–100.0)

## 2019-01-03 MED ORDER — FENTANYL CITRATE (PF) 100 MCG/2ML IJ SOLN
50.0000 ug | Freq: Once | INTRAMUSCULAR | Status: AC
  Administered 2019-01-03: 50 ug via INTRAVENOUS
  Filled 2019-01-03: qty 2

## 2019-01-03 MED ORDER — SODIUM CHLORIDE 0.9 % IV BOLUS
1000.0000 mL | Freq: Once | INTRAVENOUS | Status: AC
  Administered 2019-01-03: 1000 mL via INTRAVENOUS

## 2019-01-03 MED ORDER — IOHEXOL 350 MG/ML SOLN
100.0000 mL | Freq: Once | INTRAVENOUS | Status: AC | PRN
  Administered 2019-01-03: 18:00:00 100 mL via INTRAVENOUS

## 2019-01-03 NOTE — ED Provider Notes (Signed)
[facility] EMERGENCY DEPARTMENT Provider Note   CSN: [id number] Arrival date & time: 01/03/19  1359     History   Chief Complaint Chief Complaint  Patient presents with   Abdominal Pain    HPI [name] is a 85 y.o. male.     The history is provided by the patient and medical records. No language interpreter was used.  Abdominal Pain Pain location:  Generalized Pain quality: aching, cramping and pressure   Pain radiates to:  Chest Pain severity:  Severe Onset quality:  Gradual Duration:  1 day Timing:  Constant Progression:  Waxing and waning Chronicity:  New Relieved by:  Position changes Worsened by:  Position changes Ineffective treatments:  None tried Associated symptoms: belching, chest pain, constipation and shortness of breath (chronis exertional)   Associated symptoms: no chills, no cough, no diarrhea, no dysuria, no fatigue, no fever, no flatus, no nausea ( ) and no vomiting     Past Medical History:  Diagnosis Date   Acute on chronic combined systolic (congestive) and diastolic (congestive) heart failure (HCC)    AICD (automatic cardioverter/defibrillator) present    [employer]n PPM/ICD   Carotid stenosis, right    s/p endarterectomy 11/07/18   Chronic combined systolic (congestive) and diastolic (congestive) heart failure (HCC)    Coronary artery disease    [facility]   Diabetes mellitus    Dysrhythmia    Endocarditis, valve unspecified    Mitral   Headache    hx migraines 6-7 x mo   History of migraine headaches    History of seasonal allergies    ICD (implantable cardiac defibrillator) in place    PAF (paroxysmal atrial fibrillation) (HCC)    NO LAA occlusion at the time of surgery  [name] 2018   Pleural effusion    PVC's (premature ventricular contractions)    Ventricular tachycardia, polymorphic (HCC)    status post ICD implantation    Patient Active Problem List   Diagnosis Date  Noted   CHF (congestive heart failure) ([place]) 11/12/2018   AICD (automatic cardioverter/defibrillator) present    History of seasonal allergies    Chronic combined systolic (congestive) and diastolic (congestive) heart failure (HCC)    Acute on chronic combined systolic (congestive) and diastolic (congestive) heart failure (HCC)    Congestive heart failure (CHF) ([place]) 11/11/2018   Amaurosis fugax of right eye 11/05/2018   Preop cardiovascular exam    Type 2 diabetes mellitus with complication, without long-term current use of insulin ([place]) 11/04/2018   TIA (transient ischemic attack) 11/04/2018   CAD (coronary artery disease) 11/04/2018   Hyponatremia 11/04/2018   Closed fracture of right iliac crest ([place])    Syncope 03/04/2018   Pelvic fracture ([place]) 03/04/2018   Asthma-COPD overlap syndrome ([place]) 02/22/2018   Cough 02/22/2018   Orthostatic presyncope 08/01/2010   Coronary artery disease with exertional angina (HCC) 08/01/2010   Ventricular tachycardia, polymorphic ([place])    ATRIAL FIBRILLATION 06/15/2008   PREMATURE VENTRICULAR CONTRACTIONS 04/23/2008   RAYNAUD'S DISEASE 04/23/2008   Automatic implantable cardioverter-defibrillator in situ 04/23/2008    Past Surgical History:  Procedure Laterality Date   CARDIAC CATHETERIZATION  04/03/2007   EF 40%   CARDIAC CATHETERIZATION  02/06/2006   EF 45%. ANTERIOR HYPOKINESIS   CARDIAC CATHETERIZATION  05/29/2000   EF 50%. MILD ANTERIOR HYPOKINESIS   CARDIAC CATHETERIZATION  10/25/1999   EF 50%. SEVERE MITRAL REGURGITATION   CARDIAC CATHETERIZATION  01/06/2003   EF 50%  CARDIAC DEFIBRILLATOR PLACEMENT     CATARACT EXTRACTION W/ INTRAOCULAR LENS  IMPLANT, BILATERAL     CORONARY ARTERY BYPASS GRAFT  2001   2 VESSEL CABG AND MITRAL VALVE REPAIR. HE HAD A LIMA GRAFT TO THE LAD AND RADIAL ARTERY  GRAFT TO THE OBTUSE MARGINAL  OF LEFT CIRCUMFLEX   ENDARTERECTOMY Right 11/07/2018   Procedure: ENDARTERECTOMY  CAROTID RIGHT;  Surgeon: Waynetta Sandy, MD;  Location: [facility];  Service: Vascular;  Laterality: Right;   EP IMPLANTABLE DEVICE N/A 11/06/2014   Procedure: ICD Generator Changeout;  Surgeon: Deboraha Sprang, MD;  Location: [facility] CV LAB;  Service: Cardiovascular;  Laterality: N/A;   FOREIGN BODY REMOVAL Right 06/21/2017   Procedure: FOREIGN BODY REMOVAL ADULT RIGHT RING FINGER;  Surgeon: Leanora Cover, MD;  Location: [facility];  Service: Orthopedics;  Laterality: Right;   HEMORRHOID SURGERY     HEMORROIDECTOMY     INTRAVASCULAR PRESSURE WIRE/FFR STUDY N/A 01/05/2017   Procedure: INTRAVASCULAR PRESSURE WIRE/FFR STUDY;  Surgeon: Sherren Mocha, MD;  Location: [facility] CV LAB;  Service: Cardiovascular;  Laterality: N/A;   KNEE ARTHROSCOPY     RIGHT KNEE   LEFT HEART CATHETERIZATION WITH CORONARY ANGIOGRAM N/A 07/04/2012   Procedure: LEFT HEART CATHETERIZATION WITH CORONARY ANGIOGRAM;  Surgeon: Sherren Mocha, MD;  Location: [facility] CATH LAB;  Service: Cardiovascular;  Laterality: N/A;   MITRAL VALVE REPLACEMENT     No LAA occlusion at the time of surgery  Karie Soda report 2018   RIGHT/LEFT HEART CATH AND CORONARY/GRAFT ANGIOGRAPHY N/A 01/05/2017   Procedure: RIGHT/LEFT HEART CATH AND CORONARY/GRAFT ANGIOGRAPHY;  Surgeon: Sherren Mocha, MD;  Location: [facility] [facility] CV LAB;  Service: Cardiovascular;  Laterality: N/A;   TRANSTHORACIC ECHOCARDIOGRAM  04/02/2007   EF 35%        Home Medications    Prior to Admission medications   Medication Sig Start Date End Date Taking? Authorizing Provider  acetaminophen (TYLENOL) 325 MG tablet Take 2 tablets (650 mg total) by mouth every 6 (six) hours as needed for mild pain (or Fever >/= 101). Patient not taking: Reported on 11/11/2018 03/07/18   [name], MD  amiodarone (PACERONE) 200 MG tablet Take 1 tablet (200 mg total) TWICE daily for one month, then reduce and take 1 tablet (200 mg daily) ONCE daily Patient taking differently: Take 200  mg by mouth See admin instructions. Take 1 tablet (200 mg total) TWICE daily for one month, then reduce and take 1 tablet (200 mg daily) ONCE daily 10/26/18   Deboraha Sprang, MD  apixaban (ELIQUIS) 5 MG TABS tablet Take 1 tablet (5 mg total) by mouth 2 (two) times daily. 10/23/18   Deboraha Sprang, MD  atorvastatin (LIPITOR) 80 MG tablet Take 80 mg by mouth at bedtime.  07/12/10   [provider]  Cholecalciferol (VITAMIN D-3) 5000 UNITS TABS Take 5,000-10,000 Units by mouth See admin instructions. Take 5000 units by mouth on Sunday, Tuesday, Thursday, and Saturday. Take 10000 units by mouth on Monday, Wednesday, and Friday    [provider]  EPIPEN 2-PAK 0.3 MG/0.3ML SOAJ injection Inject 0.3 mg into the muscle daily as needed for anaphylaxis. USE ONLY IN EMERGENCY 08/12/12   [provider]  fluticasone (FLONASE) 50 MCG/ACT nasal spray Place 1 spray into the nose daily as needed for rhinitis or allergies.    [provider]  furosemide (LASIX) 20 MG tablet Take 1 tablet (20 mg total) by mouth daily as needed for fluid or edema.  03/07/18   [name], MD  ibuprofen (ADVIL) 200 MG tablet Take 400 mg by mouth every 6 (six) hours as needed for headache.    [provider]  Magnesium 250 MG TABS Take 250 mg by mouth daily.     [provider]  metoprolol succinate (TOPROL-XL) 50 MG 24 hr tablet Take 1 tablet (50 mg total) by mouth 2 (two) times daily. Take with or immediately following a meal. Patient taking differently: Take 50 mg by mouth daily. Take with or immediately following a meal. 10/23/18   Deboraha Sprang, MD  Multiple Vitamin (MULTIVITAMIN WITH MINERALS) TABS Take 1 tablet by mouth daily. Centrum Silver    [provider]  nateglinide (STARLIX) 120 MG tablet Take 120 mg by mouth Twice daily.  07/12/10   [provider]  pioglitazone (ACTOS) 45 MG tablet Take 45 mg by mouth daily.      [provider]  polyethylene  glycol (MIRALAX / GLYCOLAX) 17 g packet Take 17 g by mouth daily as needed for mild constipation. 11/08/18   Dessa Phi, DO  sitaGLIPtin (JANUVIA) 100 MG tablet Take 100 mg by mouth at bedtime.     [provider]  Tiotropium Bromide-Olodaterol (STIOLTO RESPIMAT) 2.5-2.5 MCG/ACT AERS Inhale 2 puffs into the lungs daily. 04/09/18   Lauraine Rinne, NP  triamcinolone cream (KENALOG) 0.1 % APPLY TO RASH TWICE DAILY 12/09/18   [provider]    Family History Family History  Problem Relation Age of Onset   Heart disease Father    Emphysema Father    Stroke Mother    Cancer Sister        breast cancer   Heart disease Paternal Grandmother    Heart disease Paternal Grandfather    Diabetes Other        grandmother, aunt, uncle   Cancer Other        lung cancer, aunt    Social History Social History   Tobacco Use   Smoking status: Former Smoker    Packs/day: 2.00    Years: 16.00    Pack years: 32.00    Types: Cigarettes    Start date: 02/23/1956    Quit date: 01/31/1971    Years since quitting: 47.9   Smokeless tobacco: Never Used  Substance Use Topics   Alcohol use: Yes    Alcohol/week: 0.0 standard drinks    Comment: 1-2 a week   Drug use: No     Allergies   Latex, Metformin, Rivaroxaban, Other, Sotalol, and Warfarin sodium   Review of Systems Review of Systems  Constitutional: Negative for chills, diaphoresis, fatigue and fever.  Respiratory: Positive for shortness of breath (chronis exertional). Negative for cough, choking, chest tightness and wheezing.   Cardiovascular: Positive for chest pain. Negative for palpitations and leg swelling.  Gastrointestinal: Positive for abdominal distention, abdominal pain and constipation. Negative for blood in stool, diarrhea, flatus, nausea ( ) and vomiting.  Genitourinary: Positive for decreased urine volume. Negative for dysuria, flank pain and frequency.  Musculoskeletal: Negative for back pain, neck  pain and neck stiffness.  Neurological: Negative for headaches.  Psychiatric/Behavioral: Negative for agitation.  All other systems reviewed and are negative.    Physical Exam Updated Vital Signs BP 126/79 (BP Location: Left Arm)    Pulse 73    Temp 98.7 F (37.1 C) (Oral)    Resp 20    Ht _0  (1.854 m)    Wt 93.4 kg  SpO2 98%    BMI 27.18 kg/m   Physical Exam Vitals signs and nursing note reviewed.  Constitutional:      General: He is not in acute distress.    Appearance: He is well-developed. He is not ill-appearing, toxic-appearing or diaphoretic.  HENT:     Head: Normocephalic and atraumatic.  Eyes:     General: No scleral icterus.    Conjunctiva/sclera: Conjunctivae normal.  Neck:     Musculoskeletal: Neck supple.  Cardiovascular:     Rate and Rhythm: Normal rate and regular rhythm.     Heart sounds: Normal heart sounds. No murmur.  Pulmonary:     Effort: Pulmonary effort is normal. No respiratory distress.     Breath sounds: Normal breath sounds. No wheezing, rhonchi or rales.  Chest:     Chest wall: No tenderness.  Abdominal:     General: Bowel sounds are decreased. There is distension. There are no signs of injury.     Palpations: Abdomen is soft.     Tenderness: There is generalized abdominal tenderness. There is no right CVA tenderness, left CVA tenderness, guarding or rebound.    Skin:    General: Skin is warm and dry.     Capillary Refill: Capillary refill takes less than 2 seconds.  Neurological:     General: No focal deficit present.     Mental Status: He is alert.  Psychiatric:        Mood and Affect: Mood normal.      ED Treatments / Results  Labs (all labs ordered are listed, but only abnormal results are displayed) Labs Reviewed  URINALYSIS, ROUTINE W REFLEX MICROSCOPIC - Abnormal; Notable for the following components:      Result Value   APPearance TURBID (*)    Specific Gravity, Urine >1.030 (*)    Glucose, UA 250 (*)    Bilirubin  Urine SMALL (*)    Ketones, ur 15 (*)    Protein, ur 100 (*)    All other components within normal limits  URINALYSIS, MICROSCOPIC (REFLEX) - Abnormal; Notable for the following components:   Bacteria, UA RARE (*)    All other components within normal limits  CBC WITH DIFFERENTIAL/PLATELET - Abnormal; Notable for the following components:   MCV 103.3 (*)    MCH 34.1 (*)    Platelets 138 (*)    Neutro Abs 7.8 (*)    Lymphs Abs 0.3 (*)    All other components within normal limits  COMPREHENSIVE METABOLIC PANEL - Abnormal; Notable for the following components:   Sodium 128 (*)    Chloride 95 (*)    Glucose, Bld 171 (*)    Calcium 8.8 (*)    AST 224 (*)    ALT 250 (*)    Alkaline Phosphatase 253 (*)    Total Bilirubin 3.1 (*)    All other components within normal limits  LIPASE, BLOOD - Abnormal; Notable for the following components:   Lipase 2,777 (*)    All other components within normal limits  BRAIN NATRIURETIC PEPTIDE - Abnormal; Notable for the following components:   B Natriuretic Peptide 295.0 (*)    All other components within normal limits  URINE CULTURE  SARS CORONAVIRUS 2 (TAT 6-24 HRS)  TROPONIN I (HIGH SENSITIVITY)  TROPONIN I (HIGH SENSITIVITY)    EKG EKG Interpretation  Date/Time:  Friday January 03 2019 15:57:29 EST Ventricular Rate:  65 PR Interval:    QRS Duration: 189 QT Interval:  508 QTC Calculation:  529 R Axis:   -82 Text Interpretation: Sinus rhythm Atrial premature complex Nonspecific IVCD with LAD LVH with secondary repolarization abnormality Baseline wander in lead(s) V2 When compared to priorm paced thythm with more paced beats. No STEMI Confirmed by Antony Blackbird [phone]) on 01/03/2019 4:10:50 PM   Radiology Ct Angio Chest/abd/pel For Dissection W And/or Wo Contrast  Result Date: 01/03/2019 CLINICAL DATA:  Severe chest pain EXAM: CT ANGIOGRAPHY CHEST, ABDOMEN AND PELVIS TECHNIQUE: Multidetector CT imaging through the chest, abdomen and  pelvis was performed using the standard protocol during bolus administration of intravenous contrast. Multiplanar reconstructed images and MIPs were obtained and reviewed to evaluate the vascular anatomy. CONTRAST:  133m OMNIPAQUE IOHEXOL 350 MG/ML SOLN COMPARISON:  12/31/2002, ultrasound 01/03/2008 FINDINGS: CTA CHEST FINDINGS Cardiovascular: Non contrasted images of the chest demonstrate no acute intramural hematoma. Aorta is nonaneurysmal. Moderate aortic atherosclerosis. Left-sided pacing device with partially visualized intracardiac leads. Extensive coronary vascular calcification. No significant pericardial effusion. Mediastinum/Nodes: Midline trachea. No thyroid mass. No significant adenopathy. Esophagus within normal limits. Lungs/Pleura: No acute consolidation or effusion. No pneumothorax. Bandlike scar atelectasis in the left upper lobe Musculoskeletal: Scoliosis and degenerative changes of the spine. Post sternotomy changes. No acute or suspicious osseous abnormality. Review of the MIP images confirms the above findings. CTA ABDOMEN AND PELVIS FINDINGS VASCULAR Aorta: Moderate aortic atherosclerosis. Fusiform infrarenal abdominal aortic aneurysm measuring 5.7 cm maximum on axial images over a 8.2 cm craniocaudad length. Incompletely opacified aneurysm sac with probable mural thrombus along the anterior inferior sac. No definitive dissection. No retroperitoneal hematoma. Celiac: Mild calcification at the origin without high-grade stenosis. Negative for occlusion or aneurysm. SMA: Calcification at the origin without significant stenosis Renals: Single right and single left renal arteries without significant stenosis. IMA: Not well visualized.  Inadequately opacified. Inflow: Moderate aortic atherosclerosis without occlusion or high-grade stenosis. Veins: No obvious venous abnormality within the limitations of this arterial phase study. Review of the MIP images confirms the above findings. NON-VASCULAR  Hepatobiliary: No focal hepatic abnormality. Multiple calcified gallstones. Mild enlargement of the extrahepatic common bile duct, measuring up to 13 mm. No calcified stones along the course of the duct. Mild ductal enhancement. Pancreas: Peripancreatic fluid and edema consistent with acute pancreatitis. No organized fluid collections. Spleen: Normal in size without focal abnormality. Adrenals/Urinary Tract: Adrenal glands are normal. No hydronephrosis. Cyst in the mid left kidney. The bladder is normal Stomach/Bowel: Stomach is within normal limits. Appendix appears normal. No evidence of bowel wall thickening, distention, or inflammatory changes. Sigmoid colon diverticular disease without acute inflammatory change Lymphatic: No significant adenopathy Reproductive: Prostate is unremarkable. Other: Fat containing inguinal hernias. No free air. Small amount of free fluid within the abdomen and pelvis. Musculoskeletal: Chronic appearing superior endplate deformity at L4. Diffuse degenerative changes. Review of the MIP images confirms the above findings. IMPRESSION: 1. Negative for acute aortic dissection. 5.7 cm fusiform aneurysm within the infrarenal abdominal aorta without evidence for retroperitoneal hematoma. Vascular surgery consultation recommended due to increased risk of rupture for AAA >5.5 cm. This recommendation follows ACR consensus guidelines: White Paper of the ACR Incidental Findings Committee II on Vascular Findings. J Am Coll Radiol 2013; 10:789-794. Aortic aneurysm NOS (ICD10-I71.9) 2. Peripancreatic fluid and edema, suspicious for an acute pancreatitis. Recommend correlation with enzymes. Small free fluid within the abdomen and pelvis. 3. Gallstones. Dilated common bile duct up to 13 mm which should be correlated with LFT. There may be mild ductal enhancement and biliary infection cannot be excluded. 4. Sigmoid colon  diverticular disease without acute inflammatory change Electronically Signed   By:  Donavan Foil M.D.   On: 01/03/2019 19:33    Procedures Procedures (including critical care time)  Medications Ordered in ED Medications  iohexol (OMNIPAQUE) 350 MG/ML injection 100 mL (100 mLs Intravenous Contrast Given 01/03/19 1808)  fentaNYL (SUBLIMAZE) injection 50 mcg (50 mcg Intravenous Given 01/03/19 1920)  sodium chloride 0.9 % bolus 1,000 mL (1,000 mLs Intravenous New Bag/Given 01/03/19 2003)     Initial Impression / Assessment and Plan / ED Course  I have reviewed the triage vital signs and the nursing notes.  Pertinent labs & imaging results that were available during my care of the patient were reviewed by me and considered in my medical decision making (see chart for details).        [name] is a 85 y.o. male with a past medical history significant for atrial fibrillation on Eliquis therapy, CAD status post CABG, AICD, CHF, and carotid stenosis status post recent carotid endarterectomy in October who presents with chest pain, abdominal pain, decreased flatus, and decreased urine.  Patient reports that this began yesterday around 2 PM when he noticed he was started having a crushing chest pain.  He reports it was an 8 out of 10 in severity and worsened.  He reports it then radiated down from his chest down to his lower abdomen.  He says this is where it has been ever since.  He thought that getting up and moving around would help but it does not seem to help.  He reports that the position he is laying sometimes makes the pain better and worse.  He reports that he has not been able to urinate well since yesterday and is only urinated several small times.  He did report a little bit of urine upon arrival to the emergency department and was very dark yellow.  No dysuria.  He denies recent fevers, chills or cough.  He does report that he has had exertional shortness of breath which is chronic for him.  He reports that the chest discomfort did not have associated diaphoresis or  recent trauma.  He does report that he had belching and burping but did not have nausea or vomiting.  He reports that he has not passed gas since a bowel movement earlier today.  He feels his abdomen is somewhat distended and full.  He denies any history of UTI or bladder outlet obstruction.  No history of aortic injury although he did have the carotid endarterectomy recently.  He denies any back pain or flank pain.  Denies any leg pain or leg swelling or leg symptoms.  On exam, lungs are clear and chest is nontender.  Abdomen is diffusely tender.  Bowel sounds were decreased.  No flank or CVA tenderness.  Patient has good pulses in both lower extremities and upper extremities are symmetric.  Normal strength and sensation in legs.  Clinically I am most suspecting a bladder outlet obstruction that is causing urinary retention and pressure preventing him to have bowel movements and flatus however, with the pain that began his chest radiating down to his abdomen, and his recent procedure, I do feel he will need imaging to rule out an aortic etiology.  Imaging will also help rule out other intra-abdominal pathology such as bowel obstruction.  We will get a bladder scan initially to see if he needs a Foley catheter.  Will get urinalysis and other labs.  Anticipate reassessment after work-up.  4:45 PM Nursing reports that post void residual on ultrasound was only around 100 cc.  Will not place Foley.  Doubt bladder outlet obstruction.  Will await results of work-up to determine disposition.  Patient's work-up began to return showing lipase of 2700.  AST, ALT and alk phos also elevated.  Bili elevated.  No leukocytosis or anemia.  Troponin not elevated.  BNP elevated but improved from prior.  Urinalysis does not show infection.  CT scan concerning for multiple abnormalities.  Primarily it appears patient has severe pancreatitis but no evidence of abscess or cyst.  Also evidence of possible gallstones and  dilation of the duct.  Suspect gallstone pancreatitis.  There is also finding of a 5.7 cm AAA but they do not see any acute dissection or bleed.  Vascular surgery consultation was recommended by radiology which will be placed.  8:06 PM Joe spoke with Dr. Paulita Fujita with gastroenterology who recommends patient be admitted to medicine service for monitoring and management.  They will see the patient in consultation.  They feel the patient likely does need ERCP or MRCP to further evaluate but as she just took Eliquis this morning, he would likely need to wait for any procedures.  He agreed with vascular consultation given the AAA and we will do this.  He did not feel the patient needs antibiotics at this time but agreed with some fluids for the pancreatitis.  8:14 PM Spoke with Dr. Scot Dock with vascular surgery who suspect his symptoms are primarily due to the biliary and pancreatitis problems.  They suspect this is not an acute problem with his aneurysm and he is likely stable for outpatient follow-up for this.  Given the request of Dr. Paulita Fujita to be admitted to [facility], will call hospitalist team at [facility] for admission.   Hospitalist team will admit.  Patient did develop some hypoxia after pain medication.  He is on oxygen currently.    Final Clinical Impressions(s) / ED Diagnoses   Final diagnoses:  Generalized abdominal pain  Abdominal aortic aneurysm (AAA) without rupture ([place])  Other acute pancreatitis without infection or necrosis  Gallstones  LFT elevation    ED Discharge Orders    None      Clinical Impression: 1. Generalized abdominal pain   2. Abdominal aortic aneurysm (AAA) without rupture ([place])   3. Other acute pancreatitis without infection or necrosis   4. Gallstones   5. LFT elevation     Disposition: Admit  This note was prepared with assistance of Dragon voice recognition software. Occasional wrong-word or sound-a-like substitutions may have occurred due to the inherent  limitations of voice recognition software.     Risha Barretta, Gwenyth Allegra, MD 01/04/19 [phone]

## 2019-01-03 NOTE — ED Notes (Signed)
Pt requesting pain medication; EDP notified ?

## 2019-01-03 NOTE — ED Notes (Signed)
ED Provider at bedside. 

## 2019-01-03 NOTE — ED Triage Notes (Signed)
Pt c/o lower abd pain and decreased UO started yesterday-seen and sent from [facility]-steady gait

## 2019-01-04 ENCOUNTER — Encounter (HOSPITAL_COMMUNITY): Payer: Self-pay | Admitting: Internal Medicine

## 2019-01-04 DIAGNOSIS — K8020 Calculus of gallbladder without cholecystitis without obstruction: Secondary | ICD-10-CM

## 2019-01-04 DIAGNOSIS — I714 Abdominal aortic aneurysm, without rupture, unspecified: Secondary | ICD-10-CM

## 2019-01-04 DIAGNOSIS — I4821 Permanent atrial fibrillation: Secondary | ICD-10-CM

## 2019-01-04 DIAGNOSIS — R7989 Other specified abnormal findings of blood chemistry: Secondary | ICD-10-CM

## 2019-01-04 LAB — BASIC METABOLIC PANEL
Anion gap: 8 (ref 5–15)
BUN: 13 mg/dL (ref 8–23)
CO2: 24 mmol/L (ref 22–32)
Calcium: 8.4 mg/dL — ABNORMAL LOW (ref 8.9–10.3)
Chloride: 99 mmol/L (ref 98–111)
Creatinine, Ser: 0.92 mg/dL (ref 0.61–1.24)
GFR calc Af Amer: >60 mL/min (ref >60)
GFR calc non Af Amer: >60 mL/min (ref >60)
Glucose, Bld: 133 mg/dL — ABNORMAL HIGH (ref 70–99)
Potassium: 4.6 mmol/L (ref 3.5–5.1)
Sodium: 131 mmol/L — ABNORMAL LOW (ref 135–145)

## 2019-01-04 LAB — GLUCOSE, CAPILLARY
Glucose-Capillary: 121 mg/dL — ABNORMAL HIGH (ref 70–99)
Glucose-Capillary: 111 mg/dL — ABNORMAL HIGH (ref 70–99)
Glucose-Capillary: 97 mg/dL (ref 70–99)
Glucose-Capillary: 136 mg/dL — ABNORMAL HIGH (ref 70–99)
Glucose-Capillary: 183 mg/dL — ABNORMAL HIGH (ref 70–99)

## 2019-01-04 LAB — CBC WITH DIFFERENTIAL/PLATELET
Abs Immature Granulocytes: 0.06 10*3/uL (ref 0.00–0.07)
Basophils Absolute: 0 10*3/uL (ref 0.0–0.1)
Basophils Relative: 0 %
Eosinophils Absolute: 0.1 10*3/uL (ref 0.0–0.5)
Eosinophils Relative: 1 %
HCT: 37.6 % — ABNORMAL LOW (ref 39.0–52.0)
Hemoglobin: 12.7 g/dL — ABNORMAL LOW (ref 13.0–17.0)
Immature Granulocytes: 1 %
Lymphocytes Relative: 4 %
Lymphs Abs: 0.5 10*3/uL — ABNORMAL LOW (ref 0.7–4.0)
MCH: 34.6 pg — ABNORMAL HIGH (ref 26.0–34.0)
MCHC: 33.8 g/dL (ref 30.0–36.0)
MCV: 102.5 fL — ABNORMAL HIGH (ref 80.0–100.0)
Monocytes Absolute: 0.8 10*3/uL (ref 0.1–1.0)
Monocytes Relative: 7 %
Neutro Abs: 10.3 10*3/uL — ABNORMAL HIGH (ref 1.7–7.7)
Neutrophils Relative %: 87 %
Platelets: 123 10*3/uL — ABNORMAL LOW (ref 150–400)
RBC: 3.67 MIL/uL — ABNORMAL LOW (ref 4.22–5.81)
RDW: 14.1 % (ref 11.5–15.5)
WBC: 11.8 10*3/uL — ABNORMAL HIGH (ref 4.0–10.5)
nRBC: 0 % (ref 0.0–0.2)

## 2019-01-04 LAB — SARS CORONAVIRUS 2 (TAT 6-24 HRS): SARS Coronavirus 2: NEGATIVE

## 2019-01-04 LAB — HEPATIC FUNCTION PANEL
ALT: 157 U/L — ABNORMAL HIGH (ref 0–44)
AST: 118 U/L — ABNORMAL HIGH (ref 15–41)
Albumin: 3.3 g/dL — ABNORMAL LOW (ref 3.5–5.0)
Alkaline Phosphatase: 190 U/L — ABNORMAL HIGH (ref 38–126)
Bilirubin, Direct: 0.4 mg/dL — ABNORMAL HIGH (ref 0.0–0.2)
Indirect Bilirubin: 2.1 mg/dL — ABNORMAL HIGH (ref 0.3–0.9)
Total Bilirubin: 2.5 mg/dL — ABNORMAL HIGH (ref 0.3–1.2)
Total Protein: 6.2 g/dL — ABNORMAL LOW (ref 6.5–8.1)

## 2019-01-04 LAB — TROPONIN I (HIGH SENSITIVITY)
Troponin I (High Sensitivity): 20 ng/L — ABNORMAL HIGH (ref <18)
Troponin I (High Sensitivity): 15 ng/L (ref <18)

## 2019-01-04 LAB — PROTIME-INR
INR: 1.3 — ABNORMAL HIGH (ref 0.8–1.2)
Prothrombin Time: 16.2 seconds — ABNORMAL HIGH (ref 11.4–15.2)

## 2019-01-04 LAB — APTT
aPTT: 38 seconds — ABNORMAL HIGH (ref 24–36)
aPTT: 98 seconds — ABNORMAL HIGH (ref 24–36)

## 2019-01-04 LAB — LIPASE, BLOOD: Lipase: 960 U/L — ABNORMAL HIGH (ref 11–51)

## 2019-01-04 LAB — MAGNESIUM: Magnesium: 1.9 mg/dL (ref 1.7–2.4)

## 2019-01-04 LAB — C-REACTIVE PROTEIN: CRP: 8.9 mg/dL — ABNORMAL HIGH (ref <1.0)

## 2019-01-04 LAB — HEPARIN LEVEL (UNFRACTIONATED): Heparin Unfractionated: 1.54 IU/mL — ABNORMAL HIGH (ref 0.30–0.70)

## 2019-01-04 MED ORDER — METOCLOPRAMIDE HCL 5 MG/ML IJ SOLN
10.0000 mg | Freq: Four times a day (QID) | INTRAMUSCULAR | Status: DC | PRN
  Administered 2019-01-04 – 2019-01-06 (×2): 10 mg via INTRAVENOUS
  Filled 2019-01-04 (×2): qty 2

## 2019-01-04 MED ORDER — HEPARIN (PORCINE) 25000 UT/250ML-% IV SOLN
1250.0000 [IU]/h | INTRAVENOUS | Status: DC
  Administered 2019-01-04 – 2019-01-05 (×2): 1250 [IU]/h via INTRAVENOUS
  Filled 2019-01-04 (×2): qty 250

## 2019-01-04 MED ORDER — ONDANSETRON HCL 4 MG/2ML IJ SOLN
4.0000 mg | Freq: Four times a day (QID) | INTRAMUSCULAR | Status: DC | PRN
  Administered 2019-01-04: 4 mg via INTRAVENOUS
  Filled 2019-01-04: qty 2

## 2019-01-04 MED ORDER — INSULIN ASPART 100 UNIT/ML ~~LOC~~ SOLN: 0.0000 [IU] | Freq: Three times a day (TID) | SUBCUTANEOUS | Status: DC

## 2019-01-04 MED ORDER — ONDANSETRON HCL 4 MG PO TABS: 4.0000 mg | ORAL_TABLET | Freq: Four times a day (QID) | ORAL | Status: DC | PRN

## 2019-01-04 MED ORDER — SODIUM CHLORIDE 0.9% FLUSH
10.0000 mL | Freq: Two times a day (BID) | INTRAVENOUS | Status: DC
  Administered 2019-01-07 – 2019-01-14 (×7): 10 mL

## 2019-01-04 MED ORDER — METOPROLOL TARTRATE 5 MG/5ML IV SOLN
5.0000 mg | Freq: Four times a day (QID) | INTRAVENOUS | Status: DC
  Administered 2019-01-04 (×3): 5 mg via INTRAVENOUS
  Filled 2019-01-04 (×3): qty 5

## 2019-01-04 MED ORDER — LACTATED RINGERS IV SOLN
INTRAVENOUS | Status: DC
  Administered 2019-01-04 (×2): via INTRAVENOUS

## 2019-01-04 MED ORDER — ACETAMINOPHEN 650 MG RE SUPP: 650.0000 mg | Freq: Four times a day (QID) | RECTAL | Status: DC | PRN

## 2019-01-04 MED ORDER — INSULIN ASPART 100 UNIT/ML ~~LOC~~ SOLN
0.0000 [IU] | Freq: Every day | SUBCUTANEOUS | Status: DC
  Administered 2019-01-11 – 2019-01-12 (×2): 2 [IU] via SUBCUTANEOUS

## 2019-01-04 MED ORDER — INSULIN ASPART 100 UNIT/ML ~~LOC~~ SOLN
0.0000 [IU] | Freq: Three times a day (TID) | SUBCUTANEOUS | Status: DC
  Administered 2019-01-04: 17:00:00 1 [IU] via SUBCUTANEOUS
  Administered 2019-01-05: 2 [IU] via SUBCUTANEOUS
  Administered 2019-01-05 – 2019-01-06 (×2): 1 [IU] via SUBCUTANEOUS
  Administered 2019-01-06: 10:00:00 2 [IU] via SUBCUTANEOUS
  Administered 2019-01-07 – 2019-01-08 (×3): 1 [IU] via SUBCUTANEOUS
  Administered 2019-01-08: 08:00:00 2 [IU] via SUBCUTANEOUS
  Administered 2019-01-09: 1 [IU] via SUBCUTANEOUS
  Administered 2019-01-09: 2 [IU] via SUBCUTANEOUS
  Administered 2019-01-09: 3 [IU] via SUBCUTANEOUS
  Administered 2019-01-10 (×2): 2 [IU] via SUBCUTANEOUS
  Administered 2019-01-11: 3 [IU] via SUBCUTANEOUS
  Administered 2019-01-11: 2 [IU] via SUBCUTANEOUS
  Administered 2019-01-11: 3 [IU] via SUBCUTANEOUS
  Administered 2019-01-12 (×2): 2 [IU] via SUBCUTANEOUS
  Administered 2019-01-12: 5 [IU] via SUBCUTANEOUS
  Administered 2019-01-13: 2 [IU] via SUBCUTANEOUS
  Administered 2019-01-13: 5 [IU] via SUBCUTANEOUS
  Administered 2019-01-13 – 2019-01-14 (×3): 3 [IU] via SUBCUTANEOUS

## 2019-01-04 MED ORDER — ACETAMINOPHEN 325 MG PO TABS
650.0000 mg | ORAL_TABLET | Freq: Four times a day (QID) | ORAL | Status: DC | PRN
  Administered 2019-01-08: 650 mg via ORAL
  Filled 2019-01-04: qty 2

## 2019-01-04 MED ORDER — FENTANYL CITRATE (PF) 100 MCG/2ML IJ SOLN
25.0000 ug | INTRAMUSCULAR | Status: DC | PRN
  Administered 2019-01-04 – 2019-01-05 (×6): 25 ug via INTRAVENOUS
  Filled 2019-01-04 (×6): qty 2

## 2019-01-04 MED ORDER — PIPERACILLIN-TAZOBACTAM 3.375 G IVPB
3.3750 g | Freq: Three times a day (TID) | INTRAVENOUS | Status: DC
  Administered 2019-01-04 – 2019-01-12 (×25): 3.375 g via INTRAVENOUS
  Filled 2019-01-04 (×24): qty 50

## 2019-01-04 MED ORDER — INSULIN ASPART 100 UNIT/ML ~~LOC~~ SOLN
0.0000 [IU] | SUBCUTANEOUS | Status: DC
  Administered 2019-01-04: 1 [IU] via SUBCUTANEOUS

## 2019-01-04 MED ORDER — SODIUM CHLORIDE 0.9% FLUSH: 10.0000 mL | INTRAVENOUS | Status: DC | PRN

## 2019-01-04 NOTE — Progress Notes (Signed)
Patient ID: [name], male   DOB: March 18, 1931, 85 y.o.   MRN: [id number] Patient was admitted early this morning for abdominal pain and was found to have acute pancreatitis.  I have reviewed patient medical records myself including this morning's H&P.  Seen and examined the patient at bedside and plan of care discussed with him.  I have spoken to Dr. Gillermina Hu on phone and will follow his recommendations.  Continue IV fluids and pain management.  Continue n.p.o. for now.  Repeat a.m. labs.

## 2019-01-04 NOTE — Progress Notes (Signed)
Dr. Starla Link notified of patient's multiple pacer spikes on telemetry. vital signs are normal.  Will continue to monitor patient.

## 2019-01-04 NOTE — Consult Note (Signed)
[facility] Gastroenterology Consultation Note  Referring Provider: No ref. provider found Primary Care Physician:  [name], MD Primary Gastroenterologist:  Dr.  Luiz Iron for Consultation:  pancreatitis  HPI: [name] is a 85 y.o. male whom we've been asked to see for evaluation of pancreatitis.  Few days ago, developed progressive upper and then lower abdominal pain.  Ultimately necessitating ED visit, where found to have elevated LFTs, gallstones, biliary ductal prominence, elevated lipase, AAA 5.7 cm, significant pancreatitis without abscess/perforation on CT scan. No blood in stool.  No prior issues. Some alcohol, couple shots alcohol per day. Multiple cardiac comorbidities; on apixiban.   Past Medical History:  Diagnosis Date  . Acute on chronic combined systolic (congestive) and diastolic (congestive) heart failure ([place])   . AICD (automatic cardioverter/defibrillator) present    [employer]n PPM/ICD  . Carotid stenosis, right    s/p endarterectomy 11/07/18  . Chronic combined systolic (congestive) and diastolic (congestive) heart failure ([place])   . Coronary artery disease    [facility]  [facility]  . Diabetes mellitus   . Dysrhythmia   . Endocarditis, valve unspecified    Mitral  . Headache    hx migraines 6-7 x mo  . History of migraine headaches   . History of seasonal allergies   . ICD (implantable cardiac defibrillator) in place   . PAF (paroxysmal atrial fibrillation) ([place])    NO LAA occlusion at the time of surgery  [name] 2018  . Pleural effusion   . PVC's (premature ventricular contractions)   . Ventricular tachycardia, polymorphic ([place])    status post ICD implantation    Past Surgical History:  Procedure Laterality Date  . CARDIAC CATHETERIZATION  04/03/2007   EF 40%  . CARDIAC CATHETERIZATION  02/06/2006   EF 45%. ANTERIOR HYPOKINESIS  . CARDIAC CATHETERIZATION  05/29/2000   EF 50%. MILD ANTERIOR HYPOKINESIS  . CARDIAC CATHETERIZATION   10/25/1999   EF 50%. SEVERE MITRAL REGURGITATION  . CARDIAC CATHETERIZATION  01/06/2003   EF 50%  . CARDIAC DEFIBRILLATOR PLACEMENT    . CATARACT EXTRACTION W/ INTRAOCULAR LENS  IMPLANT, BILATERAL    . CORONARY ARTERY BYPASS GRAFT  2001   2 VESSEL CABG AND MITRAL VALVE REPAIR. HE HAD A LIMA GRAFT TO THE LAD AND RADIAL ARTERY  GRAFT TO THE OBTUSE MARGINAL  OF LEFT CIRCUMFLEX  . ENDARTERECTOMY Right 11/07/2018   Procedure: ENDARTERECTOMY CAROTID RIGHT;  Surgeon: Waynetta Sandy, MD;  Location: [place];  Service: Vascular;  Laterality: Right;  . EP IMPLANTABLE DEVICE N/A 11/06/2014   Procedure: ICD Generator Changeout;  Surgeon: Deboraha Sprang, MD;  Location: [place] [facility];  Service: Cardiovascular;  Laterality: N/A;  . FOREIGN BODY REMOVAL Right 06/21/2017   Procedure: FOREIGN BODY REMOVAL ADULT RIGHT RING FINGER;  Surgeon: Leanora Cover, MD;  Location: [place];  Service: Orthopedics;  Laterality: Right;  . HEMORRHOID SURGERY    . HEMORROIDECTOMY    . INTRAVASCULAR PRESSURE WIRE/FFR STUDY N/A 01/05/2017   Procedure: INTRAVASCULAR PRESSURE WIRE/FFR STUDY;  Surgeon: Sherren Mocha, MD;  Location: [place] [facility];  Service: Cardiovascular;  Laterality: N/A;  . KNEE ARTHROSCOPY     RIGHT KNEE  . LEFT HEART CATHETERIZATION WITH CORONARY ANGIOGRAM N/A 07/04/2012   Procedure: LEFT HEART CATHETERIZATION WITH CORONARY ANGIOGRAM;  Surgeon: Sherren Mocha, MD;  Location: [facility] CATH LAB;  Service: Cardiovascular;  Laterality: N/A;  . MITRAL VALVE REPLACEMENT     No LAA occlusion at the time of surgery  M  [facility] report 2018  . RIGHT/LEFT HEART CATH AND CORONARY/GRAFT ANGIOGRAPHY N/A 01/05/2017   Procedure: RIGHT/LEFT HEART CATH AND CORONARY/GRAFT ANGIOGRAPHY;  Surgeon: Sherren Mocha, MD;  Location: [place] [facility];  Service: Cardiovascular;  Laterality: N/A;  . TRANSTHORACIC ECHOCARDIOGRAM  04/02/2007   EF 35%    Prior to Admission medications   Medication Sig Start Date End Date Taking?  Authorizing Provider  apixaban (ELIQUIS) 5 MG TABS tablet Take 1 tablet (5 mg total) by mouth 2 (two) times daily. 10/23/18  Yes Deboraha Sprang, MD  atorvastatin (LIPITOR) 80 MG tablet Take 80 mg by mouth at bedtime.  07/12/10  Yes [provider]  Cholecalciferol (VITAMIN D-3) 5000 UNITS TABS Take 5,000-10,000 Units by mouth See admin instructions. Take 5000 units by mouth on Sunday, Tuesday, Thursday, and Saturday. Take 10000 units by mouth on Monday, Wednesday, and Friday   Yes [provider]  dofetilide ([name]) 500 MCG capsule Take 500 mcg by mouth 2 (two) times daily.    Yes [provider]  EPIPEN 2-PAK 0.3 MG/0.3ML SOAJ injection Inject 0.3 mg into the muscle daily as needed for anaphylaxis. USE ONLY IN EMERGENCY 08/12/12  Yes [provider]  fluticasone (FLONASE) 50 MCG/ACT nasal spray Place 1 spray into the nose daily as needed for rhinitis or allergies.   Yes [provider]  furosemide (LASIX) 20 MG tablet Take 1 tablet (20 mg total) by mouth daily as needed for fluid or edema. 03/07/18  Yes [name], MD  ibuprofen (ADVIL) 200 MG tablet Take 400 mg by mouth every 6 (six) hours as needed for headache.   Yes [provider]  Magnesium 250 MG TABS Take 250 mg by mouth daily.    Yes [provider]  metoprolol succinate (TOPROL-XL) 50 MG 24 hr tablet Take 1 tablet (50 mg total) by mouth 2 (two) times daily. Take with or immediately following a meal. Patient taking differently: Take 50 mg by mouth daily. Take with or immediately following a meal. 10/23/18  Yes Deboraha Sprang, MD  Multiple Vitamin (MULTIVITAMIN WITH MINERALS) TABS Take 1 tablet by mouth daily. Centrum Silver   Yes [provider]  nateglinide (STARLIX) 120 MG tablet Take 120 mg by mouth Twice daily.  07/12/10  Yes [provider]  pioglitazone (ACTOS) 45 MG tablet Take 45 mg by mouth daily.     Yes [provider]  sitaGLIPtin  (JANUVIA) 100 MG tablet Take 100 mg by mouth at bedtime.    Yes [provider]  Tiotropium Bromide-Olodaterol (STIOLTO RESPIMAT) 2.5-2.5 MCG/ACT AERS Inhale 2 puffs into the lungs daily. 04/09/18  Yes Lauraine Rinne, NP  triamcinolone cream (KENALOG) 0.1 % Apply 1 application topically 2 (two) times daily as needed (rash).  12/09/18  Yes [provider]  amiodarone (PACERONE) 200 MG tablet Take 1 tablet (200 mg total) TWICE daily for one month, then reduce and take 1 tablet (200 mg daily) ONCE daily Patient not taking: Reported on 01/04/2019 10/26/18   Deboraha Sprang, MD    Current Facility-Administered Medications  Medication Dose Route Frequency Provider Last Rate Last Dose  . acetaminophen (TYLENOL) tablet 650 mg  650 mg Oral Q6H PRN Rise Patience, MD       Or  . acetaminophen (TYLENOL) suppository 650 mg  650 mg Rectal Q6H PRN Rise Patience, MD      . fentaNYL (SUBLIMAZE) injection 25 mcg  25 mcg Intravenous Q2H PRN Rise Patience, MD  25 mcg at 01/04/19 0309  . heparin ADULT infusion 100 units/mL (25000 units/225mL sodium chloride 0.45%)  1,250 Units/hr Intravenous Continuous Rise Patience, MD 12.5 mL/hr at 01/04/19 0849 1,250 Units/hr at 01/04/19 0849  . insulin aspart (novoLOG) injection 0-9 Units  0-9 Units Subcutaneous Q4H Rise Patience, MD   1 Units at 01/04/19 [phone]  . lactated ringers infusion   Intravenous Continuous Rise Patience, MD 100 mL/hr at 01/04/19 0546    . metoprolol tartrate (LOPRESSOR) injection 5 mg  5 mg Intravenous Q6H Rise Patience, MD   5 mg at 01/04/19 0540  . ondansetron (ZOFRAN) tablet 4 mg  4 mg Oral Q6H PRN Rise Patience, MD       Or  . ondansetron [facility]) injection 4 mg  4 mg Intravenous Q6H PRN Rise Patience, MD      . piperacillin-tazobactam (ZOSYN) IVPB 3.375 g  3.375 g Intravenous Q8H Rise Patience, MD 12.5 mL/hr at 01/04/19 0546    . sodium chloride flush (NS) 0.9 %  injection 10-40 mL  10-40 mL Intracatheter Q12H Alekh, Kshitiz, MD      . sodium chloride flush (NS) 0.9 % injection 10-40 mL  10-40 mL Intracatheter PRN Aline August, MD        Allergies as of 01/03/2019 - Review Complete 01/03/2019  Allergen Reaction Noted  . Latex Other (See Comments) 11/04/2014  . Metformin Diarrhea 03/26/2016  . Rivaroxaban Other (See Comments) 03/26/2016  . Other Other (See Comments) 09/27/2012  . Sotalol Other (See Comments) 11/21/2010  . Warfarin sodium Other (See Comments)     Family History  Problem Relation Age of Onset  . Heart disease Father   . Emphysema Father   . Stroke Mother   . Cancer Sister        breast cancer  . Heart disease Paternal Grandmother   . Heart disease Paternal Grandfather   . Diabetes Other        grandmother, aunt, uncle  . Cancer Other        lung cancer, aunt    Social History   Socioeconomic History  . Marital status: Married    Spouse name: Mardene Celeste  . Number of children: 2  . Years of education: Not on file  . Highest education level: Associate degree: academic program  Occupational History  . Occupation: retired  Scientific laboratory technician  . Financial resource strain: Not on file  . Food insecurity    Worry: Not on file    Inability: Not on file  . Transportation needs    Medical: Not on file    Non-medical: Not on file  Tobacco Use  . Smoking status: Former Smoker    Packs/day: 2.00    Years: 16.00    Pack years: 32.00    Types: Cigarettes    Start date: 02/23/1956    Quit date: 01/31/1971    Years since quitting: 47.9  . Smokeless tobacco: Never Used  Substance and Sexual Activity  . Alcohol use: Yes    Alcohol/week: 0.0 standard drinks    Comment: 1-2 a week  . Drug use: No  . Sexual activity: Yes  Lifestyle  . Physical activity    Days per week: Not on file    Minutes per session: Not on file  . Stress: Not on file  Relationships  . Social Herbalist on phone: Not on file    Gets  together: Not on file  Attends religious service: Not on file    Active member of club or organization: Not on file    Attends meetings of clubs or organizations: Not on file    Relationship status: Not on file  . Intimate partner violence    Fear of current or ex partner: Not on file    Emotionally abused: Not on file    Physically abused: Not on file    Forced sexual activity: Not on file  Other Topics Concern  . Not on file  Social History Narrative   Patient is right-handed. He lives with his wife ina 2 story home. He drinks one cup of coffee and a diet coke a day.     Review of Systems: as per HPI, all others negative  Physical Exam: Vital signs in last 24 hours: Temp:  [98.1 F (36.7 C)-98.7 F (37.1 C)] 98.1 F (36.7 C) (12/05 0537) Pulse Rate:  [28-114] 67 (12/05 0537) Resp:  [19-24] 22 (12/05 0537) BP: (100-127)/(61-80) 118/61 (12/05 0537) SpO2:  [87 %-99 %] 94 % (12/05 0537) Weight:  [92.9 kg-93.4 kg] 92.9 kg (12/05 0242) Last BM Date: 01/03/19 General:   Alert, overweight,  Well-developed, well-nourished, pleasant and cooperative in NAD Head:  Normocephalic and atraumatic. Eyes:  Sclera slight icteric,   Conjunctiva pink. Ears:  Normal auditory acuity. Nose:  No deformity, discharge,  or lesions. Mouth:  No deformity or lesions.  Oropharynx pink & moist. Neck:  Supple; no masses or thyromegaly. Lungs:  Clear throughout to auscultation.   No wheezes, crackles, or rhonchi. No acute distress. Heart:  Regular rate and rhythm; no murmurs, clicks, rubs,  or gallops. Abdomen:  Soft, protuberant, mild generalized tenderness (worse periumbilical and lower abdominal). No masses, hepatosplenomegaly or hernias noted. Normal bowel sounds, without guarding, and without rebound.     Msk:  Symmetrical without gross deformities. Normal posture. Pulses:  Normal pulses noted. Extremities:  Without clubbing or edema. Neurologic:  Alert and  oriented x4; diffusely weak, otherwise  grossly normal neurologically. Skin:  Scattered ecchymoses, otherwise Intact without significant lesions or rashes. Cervical Nodes:  No significant cervical adenopathy. Psych:  Alert and cooperative. Normal mood and affect.   Lab Results: Recent Labs    01/03/19 1548 01/04/19 0500  WBC 9.0 11.8*  HGB 14.4 12.7*  HCT 43.6 37.6*  PLT 138* 123*   BMET Recent Labs    01/03/19 1548 01/04/19 0500  NA 128* 131*  K 4.0 4.6  CL 95* 99  CO2 23 24  GLUCOSE 171* 133*  BUN 17 13  CREATININE 0.72 0.92  CALCIUM 8.8* 8.4*   LFT Recent Labs    01/04/19 0500  PROT 6.2*  ALBUMIN 3.3*  AST 118*  ALT 157*  ALKPHOS 190*  BILITOT 2.5*  BILIDIR 0.4*  IBILI 2.1*   PT/INR Recent Labs    01/04/19 0500  LABPROT 16.2*  INR 1.3*    Studies/Results: Ct Angio Chest/abd/pel For Dissection W And/or Wo Contrast  Result Date: 01/03/2019 CLINICAL DATA:  Severe chest pain EXAM: CT ANGIOGRAPHY CHEST, ABDOMEN AND PELVIS TECHNIQUE: Multidetector CT imaging through the chest, abdomen and pelvis was performed using the standard protocol during bolus administration of intravenous contrast. Multiplanar reconstructed images and MIPs were obtained and reviewed to evaluate the vascular anatomy. CONTRAST:  173mL OMNIPAQUE IOHEXOL 350 MG/ML SOLN COMPARISON:  12/31/2002, ultrasound 01/03/2008 FINDINGS: CTA CHEST FINDINGS Cardiovascular: Non contrasted images of the chest demonstrate no acute intramural hematoma. Aorta is nonaneurysmal. Moderate aortic atherosclerosis. Left-sided pacing device  with partially visualized intracardiac leads. Extensive coronary vascular calcification. No significant pericardial effusion. Mediastinum/Nodes: Midline trachea. No thyroid mass. No significant adenopathy. Esophagus within normal limits. Lungs/Pleura: No acute consolidation or effusion. No pneumothorax. Bandlike scar atelectasis in the left upper lobe Musculoskeletal: Scoliosis and degenerative changes of the spine. Post  sternotomy changes. No acute or suspicious osseous abnormality. Review of the MIP images confirms the above findings. CTA ABDOMEN AND PELVIS FINDINGS VASCULAR Aorta: Moderate aortic atherosclerosis. Fusiform infrarenal abdominal aortic aneurysm measuring 5.7 cm maximum on axial images over a 8.2 cm craniocaudad length. Incompletely opacified aneurysm sac with probable mural thrombus along the anterior inferior sac. No definitive dissection. No retroperitoneal hematoma. Celiac: Mild calcification at the origin without high-grade stenosis. Negative for occlusion or aneurysm. SMA: Calcification at the origin without significant stenosis Renals: Single right and single left renal arteries without significant stenosis. IMA: Not well visualized.  Inadequately opacified. Inflow: Moderate aortic atherosclerosis without occlusion or high-grade stenosis. Veins: No obvious venous abnormality within the limitations of this arterial phase study. Review of the MIP images confirms the above findings. NON-VASCULAR Hepatobiliary: No focal hepatic abnormality. Multiple calcified gallstones. Mild enlargement of the extrahepatic common bile duct, measuring up to 13 mm. No calcified stones along the course of the duct. Mild ductal enhancement. Pancreas: Peripancreatic fluid and edema consistent with acute pancreatitis. No organized fluid collections. Spleen: Normal in size without focal abnormality. Adrenals/Urinary Tract: Adrenal glands are normal. No hydronephrosis. Cyst in the mid left kidney. The bladder is normal Stomach/Bowel: Stomach is within normal limits. Appendix appears normal. No evidence of bowel wall thickening, distention, or inflammatory changes. Sigmoid colon diverticular disease without acute inflammatory change Lymphatic: No significant adenopathy Reproductive: Prostate is unremarkable. Other: Fat containing inguinal hernias. No free air. Small amount of free fluid within the abdomen and pelvis. Musculoskeletal:  Chronic appearing superior endplate deformity at L4. Diffuse degenerative changes. Review of the MIP images confirms the above findings. IMPRESSION: 1. Negative for acute aortic dissection. 5.7 cm fusiform aneurysm within the infrarenal abdominal aorta without evidence for retroperitoneal hematoma. Vascular surgery consultation recommended due to increased risk of rupture for AAA >5.5 cm. This recommendation follows ACR consensus guidelines: White Paper of the ACR Incidental Findings Committee II on Vascular Findings. J Am Coll Radiol 2013; 10:789-794. Aortic aneurysm NOS (ICD10-I71.9) 2. Peripancreatic fluid and edema, suspicious for an acute pancreatitis. Recommend correlation with enzymes. Small free fluid within the abdomen and pelvis. 3. Gallstones. Dilated common bile duct up to 13 mm which should be correlated with LFT. There may be mild ductal enhancement and biliary infection cannot be excluded. 4. Sigmoid colon diverticular disease without acute inflammatory change Electronically Signed   By: Donavan Foil M.D.   On: 01/03/2019 19:33   Impression:  1.  Gallstone pancreatitis. 2.  Elevated LFTs. 3.  Abdominal pain, due to #1 above.  Much improved. 4.  CAD with multiple comorbidities, including newly diagnosed AAA. 5.  Chronic anticoagulation, apixaban. 6.  Recent TIAs, with recent carotid endarterectomy couple months ago.  Plan:  1.  Medical management pancreatitis (IVF, analgesics as needed). 2.  Sips clear liquids ok. 3.  Hold anticoagulation. 4.  Unclear what patient will need at this point, but likely cholecystectomy with possible ERCP.  Regardless of whether cholecystectomy or ERCP (Or both are needed), he will need cardiac clearance to have these procedures done.  These procedures wouldn't be done until at least early next week, given his pancreatitis and apixaban use. 5.  Follow LFT trend  and medical progress pancreatitis.; if downtrending, consider surgical consultation early next  week for cholecystectomy with IOC; if not downtrending, consider ERCP early next week. 6.  [facility] will follow.   LOS: 1 day   Tabb Croghan M  01/04/2019, 10:07 AM  Cell [phone] If no answer or after 5 PM call [phone]

## 2019-01-04 NOTE — Progress Notes (Signed)
Received pt here in 6N from [place], alert orientedx4, skin intact with vital signs stable. Pt rated pain as 9/10, given fentanyl 25 mcg PRN per MD's order, tolerated well. Oriented to room, bed controls and plan of care. Left lying comfortably in bed with call bell at reach, will continue to monitor.

## 2019-01-04 NOTE — H&P (Addendum)
History and Physical    [name] [id number] DOB: [birth year] DOA: 01/03/2019  PCP: [name], MD  Patient coming from: Home.  Chief Complaint: Abdominal pain.  HPI: [name] is a 85 y.o. male with history of CAD status post CABG, chronic systolic heart failure status post AICD placement, A. fib on apixaban and [name] had a reaction to amiodarone recently, recent carotid endarterectomy admitted recently for CHF in October 2 months ago presents to the ER with complaint of severe abdominal pain over the last 2 days with no nausea vomiting or diarrhea.  Abdominal pain is mostly in the lower part of the abdomen.  Likely.  Denies any fever chills or chest pain or shortness of breath.  Abdominal pain is dull severe pain not associated with food.  ED Course: In the ER patient's labs show elevated LFTs with AST 224 ALT 250 total bilirubin 3.1 lipase 277.  Creatinine 1.7 hemoglobin was 14.4 platelets 138.  BNP 295 COVID-19 test was negative high-sensitivity troponin was negative.  EKG shows paced rhythm.  CT abdomen pelvis shows features concerning for acute pancreatitis with dilated CBD and gallstones with ductal enhancement concerning for infection.  In addition is also showed infrarenal abdominal aortic aneurysm measuring 5.7 cm with no dissection.  Patient was started on IV fluids and pain relief medication and admitted for acute pancreatitis.  Review of Systems: As per HPI, rest all negative.   Past Medical History:  Diagnosis Date   Acute on chronic combined systolic (congestive) and diastolic (congestive) heart failure (HCC)    AICD (automatic cardioverter/defibrillator) present    Medtronicn PPM/ICD   Carotid stenosis, right    s/p endarterectomy 11/07/18   Chronic combined systolic (congestive) and diastolic (congestive) heart failure (HCC)    Coronary artery disease    [facility]   Diabetes mellitus    Dysrhythmia    Endocarditis,  valve unspecified    Mitral   Headache    hx migraines 6-7 x mo   History of migraine headaches    History of seasonal allergies    ICD (implantable cardiac defibrillator) in place    PAF (paroxysmal atrial fibrillation) ([place])    NO LAA occlusion at the time of surgery  [name] 2018   Pleural effusion    PVC's (premature ventricular contractions)    Ventricular tachycardia, polymorphic ([place])    status post ICD implantation    Past Surgical History:  Procedure Laterality Date   CARDIAC CATHETERIZATION  04/03/2007   EF 40%   CARDIAC CATHETERIZATION  02/06/2006   EF 45%. ANTERIOR HYPOKINESIS   CARDIAC CATHETERIZATION  05/29/2000   EF 50%. MILD ANTERIOR HYPOKINESIS   CARDIAC CATHETERIZATION  10/25/1999   EF 50%. SEVERE MITRAL REGURGITATION   CARDIAC CATHETERIZATION  01/06/2003   EF 50%   CARDIAC DEFIBRILLATOR PLACEMENT     CATARACT EXTRACTION W/ INTRAOCULAR LENS  IMPLANT, BILATERAL     CORONARY ARTERY BYPASS GRAFT  2001   2 VESSEL CABG AND MITRAL VALVE REPAIR. HE HAD A LIMA GRAFT TO THE LAD AND RADIAL ARTERY  GRAFT TO THE OBTUSE MARGINAL  OF LEFT CIRCUMFLEX   ENDARTERECTOMY Right 11/07/2018   Procedure: ENDARTERECTOMY CAROTID RIGHT;  Surgeon: Waynetta Sandy, MD;  Location: [facility];  Service: Vascular;  Laterality: Right;   EP IMPLANTABLE DEVICE N/A 11/06/2014   Procedure: ICD Generator Changeout;  Surgeon: Deboraha Sprang, MD;  Location: [facility];  Service: Cardiovascular;  Laterality: N/A;  FOREIGN BODY REMOVAL Right 06/21/2017   Procedure: FOREIGN BODY REMOVAL ADULT RIGHT RING FINGER;  Surgeon: Leanora Cover, MD;  Location: [facility];  Service: Orthopedics;  Laterality: Right;   HEMORRHOID SURGERY     HEMORROIDECTOMY     INTRAVASCULAR PRESSURE WIRE/FFR STUDY N/A 01/05/2017   Procedure: INTRAVASCULAR PRESSURE WIRE/FFR STUDY;  Surgeon: Sherren Mocha, MD;  Location: [facility];  Service: Cardiovascular;  Laterality: N/A;   KNEE ARTHROSCOPY      RIGHT KNEE   LEFT HEART CATHETERIZATION WITH CORONARY ANGIOGRAM N/A 07/04/2012   Procedure: LEFT HEART CATHETERIZATION WITH CORONARY ANGIOGRAM;  Surgeon: Sherren Mocha, MD;  Location: [facility];  Service: Cardiovascular;  Laterality: N/A;   MITRAL VALVE REPLACEMENT     No LAA occlusion at the time of surgery  Karie Soda report 2018   RIGHT/LEFT HEART CATH AND CORONARY/GRAFT ANGIOGRAPHY N/A 01/05/2017   Procedure: RIGHT/LEFT HEART CATH AND CORONARY/GRAFT ANGIOGRAPHY;  Surgeon: Sherren Mocha, MD;  Location: [facility];  Service: Cardiovascular;  Laterality: N/A;   TRANSTHORACIC ECHOCARDIOGRAM  04/02/2007   EF 35%     reports that he quit smoking about 47 years ago. His smoking use included cigarettes. He started smoking about 62 years ago. He has a 32.00 pack-year smoking history. He has never used smokeless tobacco. He reports current alcohol use. He reports that he does not use drugs.  Allergies  Allergen Reactions   Latex Other (See Comments)    REDNESS AT REACTION SITE.   Metformin Diarrhea   Rivaroxaban Other (See Comments)    Headaches, blurred vision   Other Other (See Comments)    BEE STINGS    Sotalol Other (See Comments)    "BLUE TOES"- cut his circulation off   Warfarin Sodium Other (See Comments)    REACTION: migraine headaches/vision impariment    Family History  Problem Relation Age of Onset   Heart disease Father    Emphysema Father    Stroke Mother    Cancer Sister        breast cancer   Heart disease Paternal Grandmother    Heart disease Paternal Grandfather    Diabetes Other        grandmother, aunt, uncle   Cancer Other        lung cancer, aunt    Prior to Admission medications   Medication Sig Start Date End Date Taking? Authorizing Provider  acetaminophen (TYLENOL) 325 MG tablet Take 2 tablets (650 mg total) by mouth every 6 (six) hours as needed for mild pain (or Fever >/= 101). Patient not taking: Reported on 11/11/2018 03/07/18    [name], MD  amiodarone (PACERONE) 200 MG tablet Take 1 tablet (200 mg total) TWICE daily for one month, then reduce and take 1 tablet (200 mg daily) ONCE daily Patient taking differently: Take 200 mg by mouth See admin instructions. Take 1 tablet (200 mg total) TWICE daily for one month, then reduce and take 1 tablet (200 mg daily) ONCE daily 10/26/18   Deboraha Sprang, MD  apixaban (ELIQUIS) 5 MG TABS tablet Take 1 tablet (5 mg total) by mouth 2 (two) times daily. 10/23/18   Deboraha Sprang, MD  atorvastatin (LIPITOR) 80 MG tablet Take 80 mg by mouth at bedtime.  07/12/10   [provider]  Cholecalciferol (VITAMIN D-3) 5000 UNITS TABS Take 5,000-10,000 Units by mouth See admin instructions. Take 5000 units by mouth on Sunday, Tuesday, Thursday, and Saturday. Take 10000 units by mouth on Monday,  Wednesday, and Friday    [provider]  EPIPEN 2-PAK 0.3 MG/0.3ML SOAJ injection Inject 0.3 mg into the muscle daily as needed for anaphylaxis. USE ONLY IN EMERGENCY 08/12/12   [provider]  fluticasone (FLONASE) 50 MCG/ACT nasal spray Place 1 spray into the nose daily as needed for rhinitis or allergies.    [provider]  furosemide (LASIX) 20 MG tablet Take 1 tablet (20 mg total) by mouth daily as needed for fluid or edema. 03/07/18   [name], MD  ibuprofen (ADVIL) 200 MG tablet Take 400 mg by mouth every 6 (six) hours as needed for headache.    [provider]  Magnesium 250 MG TABS Take 250 mg by mouth daily.     [provider]  metoprolol succinate (TOPROL-XL) 50 MG 24 hr tablet Take 1 tablet (50 mg total) by mouth 2 (two) times daily. Take with or immediately following a meal. Patient taking differently: Take 50 mg by mouth daily. Take with or immediately following a meal. 10/23/18   Deboraha Sprang, MD  Multiple Vitamin (MULTIVITAMIN WITH MINERALS) TABS Take 1 tablet by mouth daily. Centrum Silver    [provider]   nateglinide (STARLIX) 120 MG tablet Take 120 mg by mouth Twice daily.  07/12/10   [provider]  pioglitazone (ACTOS) 45 MG tablet Take 45 mg by mouth daily.      [provider]  polyethylene glycol (MIRALAX / GLYCOLAX) 17 g packet Take 17 g by mouth daily as needed for mild constipation. 11/08/18   Dessa Phi, DO  sitaGLIPtin (JANUVIA) 100 MG tablet Take 100 mg by mouth at bedtime.     [provider]  Tiotropium Bromide-Olodaterol (STIOLTO RESPIMAT) 2.5-2.5 MCG/ACT AERS Inhale 2 puffs into the lungs daily. 04/09/18   Lauraine Rinne, NP  triamcinolone cream (KENALOG) 0.1 % APPLY TO RASH TWICE DAILY 12/09/18   [provider]    Physical Exam: Constitutional: Moderately built and nourished. Vitals:   01/04/19 0000 01/04/19 0030 01/04/19 0242 01/04/19 0537  BP: 111/70 110/67 117/80 118/61  Pulse: 71 73 78 67  Resp: (!) 22 (!) 24 20 (!) 22  Temp:   98.2 F (36.8 C) 98.1 F (36.7 C)  TempSrc:   Oral Oral  SpO2: 95% 95% 98% 94%  Weight:   92.9 kg   Height:   6\' 1"  (1.854 m)    Eyes: Anicteric no pallor. ENMT: No discharge from the ears eyes nose or mouth. Neck: No mass or.  No neck rigidity. Respiratory: No rhonchi or crepitations. Cardiovascular: S1-S2 heard. Abdomen: Soft mild epigastric tenderness no guarding or rigidity. Musculoskeletal: No edema. Skin: No rash. Neurologic: Alert awake oriented to time place and person.  Moves all extremities. Psychiatric: Appears normal per normal affect.   Labs on Admission: I have personally reviewed following labs and imaging studies  CBC: Recent Labs  Lab 01/03/19 1548 01/04/19 0500  WBC 9.0 11.8*  NEUTROABS 7.8* 10.3*  HGB 14.4 12.7*  HCT 43.6 37.6*  MCV 103.3* 102.5*  PLT 138* [id number]*   Basic Metabolic Panel: Recent Labs  Lab 01/03/19 1548  NA 128*  K 4.0  CL 95*  CO2 23  GLUCOSE 171*  BUN 17  CREATININE 0.72  CALCIUM 8.8*   GFR: Estimated Creatinine Clearance: 76.3 mL/min (by  C-G formula based on SCr of 0.72 mg/dL). Liver Function Tests: Recent Labs  Lab 01/03/19 1548  AST 224*  ALT 250*  ALKPHOS 253*  BILITOT  3.1*  PROT 7.5  ALBUMIN 4.2   Recent Labs  Lab 01/03/19 1548  LIPASE 2,777*   No results for input(s): AMMONIA in the last 168 hours. Coagulation Profile: No results for input(s): INR, PROTIME in the last 168 hours. Cardiac Enzymes: No results for input(s): CKTOTAL, CKMB, CKMBINDEX, TROPONINI in the last 168 hours. BNP (last 3 results) No results for input(s): PROBNP in the last 8760 hours. HbA1C: No results for input(s): HGBA1C in the last 72 hours. CBG: Recent Labs  Lab 01/04/19 0536  GLUCAP 121*   Lipid Profile: No results for input(s): CHOL, HDL, LDLCALC, TRIG, CHOLHDL, LDLDIRECT in the last 72 hours. Thyroid Function Tests: No results for input(s): TSH, T4TOTAL, FREET4, T3FREE, THYROIDAB in the last 72 hours. Anemia Panel: No results for input(s): VITAMINB12, FOLATE, FERRITIN, TIBC, IRON, RETICCTPCT in the last 72 hours. Urine analysis:    Component Value Date/Time   COLORURINE YELLOW 01/03/2019 1417   APPEARANCEUR TURBID (A) 01/03/2019 1417   LABSPEC >1.030 (H) 01/03/2019 1417   PHURINE 6.0 01/03/2019 1417   GLUCOSEU 250 (A) 01/03/2019 1417   HGBUR NEGATIVE 01/03/2019 1417   BILIRUBINUR SMALL (A) 01/03/2019 1417   KETONESUR 15 (A) 01/03/2019 1417   PROTEINUR 100 (A) 01/03/2019 1417   NITRITE NEGATIVE 01/03/2019 1417   LEUKOCYTESUR NEGATIVE 01/03/2019 1417   Sepsis Labs: @LABRCNTIP (procalcitonin:4,lacticidven:4) ) Recent Results (from the past 240 hour(s))  SARS CORONAVIRUS 2 (TAT 6-24 HRS) Nasopharyngeal Nasopharyngeal Swab     Status: None   Collection Time: 01/03/19  8:36 PM   Specimen: Nasopharyngeal Swab  Result Value Ref Range Status   SARS Coronavirus 2 NEGATIVE NEGATIVE Final    Comment: (NOTE) SARS-CoV-2 target nucleic acids are NOT DETECTED. The SARS-CoV-2 RNA is generally detectable in upper and  lower respiratory specimens during the acute phase of infection. Negative results do not preclude SARS-CoV-2 infection, do not rule out co-infections with other pathogens, and should not be used as the sole basis for treatment or other patient management decisions. Negative results must be combined with clinical observations, patient history, and epidemiological information. The expected result is Negative. Fact Sheet for Patients: SugarRoll.be Fact Sheet for Healthcare Providers: https://www.woods-mathews.com/ This test is not yet approved or cleared by the [place] FDA and  has been authorized for detection and/or diagnosis of SARS-CoV-2 by FDA under an Emergency Use Authorization (EUA). This EUA will remain  in effect (meaning this test can be used) for the duration of the COVID-19 declaration under Section 56 4(b)(1) of the Act, 21 U.S.C. section 360bbb-3(b)(1), unless the authorization is terminated or revoked sooner. Performed at [facility], [place] [address] [place], [place] [address]      Radiological Exams on Admission: Ct Angio Chest/abd/pel For Dissection W And/or Wo Contrast  Result Date: 01/03/2019 CLINICAL DATA:  Severe chest pain EXAM: CT ANGIOGRAPHY CHEST, ABDOMEN AND PELVIS TECHNIQUE: Multidetector CT imaging through the chest, abdomen and pelvis was performed using the standard protocol during bolus administration of intravenous contrast. Multiplanar reconstructed images and MIPs were obtained and reviewed to evaluate the vascular anatomy. CONTRAST:  171mL OMNIPAQUE IOHEXOL 350 MG/ML SOLN COMPARISON:  12/31/2002, ultrasound 01/03/2008 FINDINGS: CTA CHEST FINDINGS Cardiovascular: Non contrasted images of the chest demonstrate no acute intramural hematoma. Aorta is nonaneurysmal. Moderate aortic atherosclerosis. Left-sided pacing device with partially visualized intracardiac leads. Extensive coronary vascular calcification.  No significant pericardial effusion. Mediastinum/Nodes: Midline trachea. No thyroid mass. No significant adenopathy. Esophagus within normal limits. Lungs/Pleura: No acute consolidation or effusion. No  pneumothorax. Bandlike scar atelectasis in the left upper lobe Musculoskeletal: Scoliosis and degenerative changes of the spine. Post sternotomy changes. No acute or suspicious osseous abnormality. Review of the MIP images confirms the above findings. CTA ABDOMEN AND PELVIS FINDINGS VASCULAR Aorta: Moderate aortic atherosclerosis. Fusiform infrarenal abdominal aortic aneurysm measuring 5.7 cm maximum on axial images over a 8.2 cm craniocaudad length. Incompletely opacified aneurysm sac with probable mural thrombus along the anterior inferior sac. No definitive dissection. No retroperitoneal hematoma. Celiac: Mild calcification at the origin without high-grade stenosis. Negative for occlusion or aneurysm. SMA: Calcification at the origin without significant stenosis Renals: Single right and single left renal arteries without significant stenosis. IMA: Not well visualized.  Inadequately opacified. Inflow: Moderate aortic atherosclerosis without occlusion or high-grade stenosis. Veins: No obvious venous abnormality within the limitations of this arterial phase study. Review of the MIP images confirms the above findings. NON-VASCULAR Hepatobiliary: No focal hepatic abnormality. Multiple calcified gallstones. Mild enlargement of the extrahepatic common bile duct, measuring up to 13 mm. No calcified stones along the course of the duct. Mild ductal enhancement. Pancreas: Peripancreatic fluid and edema consistent with acute pancreatitis. No organized fluid collections. Spleen: Normal in size without focal abnormality. Adrenals/Urinary Tract: Adrenal glands are normal. No hydronephrosis. Cyst in the mid left kidney. The bladder is normal Stomach/Bowel: Stomach is within normal limits. Appendix appears normal. No evidence of  bowel wall thickening, distention, or inflammatory changes. Sigmoid colon diverticular disease without acute inflammatory change Lymphatic: No significant adenopathy Reproductive: Prostate is unremarkable. Other: Fat containing inguinal hernias. No free air. Small amount of free fluid within the abdomen and pelvis. Musculoskeletal: Chronic appearing superior endplate deformity at L4. Diffuse degenerative changes. Review of the MIP images confirms the above findings. IMPRESSION: 1. Negative for acute aortic dissection. 5.7 cm fusiform aneurysm within the infrarenal abdominal aorta without evidence for retroperitoneal hematoma. Vascular surgery consultation recommended due to increased risk of rupture for AAA >5.5 cm. This recommendation follows ACR consensus guidelines: White Paper of the ACR Incidental Findings Committee II on Vascular Findings. J Am Coll Radiol 2013; 10:789-794. Aortic aneurysm NOS (ICD10-I71.9) 2. Peripancreatic fluid and edema, suspicious for an acute pancreatitis. Recommend correlation with enzymes. Small free fluid within the abdomen and pelvis. 3. Gallstones. Dilated common bile duct up to 13 mm which should be correlated with LFT. There may be mild ductal enhancement and biliary infection cannot be excluded. 4. Sigmoid colon diverticular disease without acute inflammatory change Electronically Signed   By: Donavan Foil M.D.   On: 01/03/2019 19:33    EKG: Independently reviewed.  Paced rhythm.  Assessment/Plan Principal Problem:   Acute pancreatitis Active Problems:   ATRIAL FIBRILLATION   Automatic implantable cardioverter-defibrillator in situ   Coronary artery disease with exertional angina (HCC)   Type 2 diabetes mellitus with complication, without long-term current use of insulin (HCC)   Hyponatremia   LFT elevation   Gallstones   Abdominal aortic aneurysm (AAA) without rupture ([place])    1. Acute pancreatitis -given the dilated CBD and gallstones likely could be  gallstone related pancreatitis.  Patient does drink alcohol daily but I think given the CBD dilatation and gallstones gallstones should be the cause at this time.  Could not be able to do MRCP due to patient having AICD.  Consult gastroenterologist in the morning.  Since that is some ductal enhancement concerning for infection we will keep patient on empiric antibiotics.  Follow LFTs CRP hemoglobin BUN/creatinine and hematocrit.  Continue with hydration.  Note  that patient has CHF. 2. Infrarenal abdominal aortic aneurysm measuring 5.7 cm with no dissection.  Will need vascular surgery input. 3. A. fib presently rate controlled.  I have ordered IV metoprolol as scheduled dose along with heparin for anticoagulation instead of apixaban in case patient needs procedure.  Last dose of apixaban was yesterday morning.  Takes [name] but is presently n.p.o. 4. CAD denies any chest pain.  Troponins negative. 5. Hyponatremia recently diagnosed with SIADH.  Follow metabolic panel closely. 6. Chronic systolic heart failure last EF measured in October 20 22 months ago was 35 to 40%.  Presently receiving IV fluids due to acute pancreatitis.  Note that patient has hyponatremia due to SIADH. 7. Diabetes mellitus type 2 we will keep patient on sliding scale coverage. 8. Thrombocytopenia  -CBC done in December 01, 2020-does show thrombocytopenia.  Follow CBC. 9. Drinks scotch whiskey every night closely watch out for any withdrawal.  Given that patient has multiple comorbidities with acute pancreatitis will need more than 2 midnight stay in inpatient status.   DVT prophylaxis: Heparin. Code Status: Full code. Family Communication: Discussed with patient. Disposition Plan: Home. Consults called: None. Admission status: Inpatient.   Rise Patience MD [facility] Pager [phone].  If 7PM-7AM, please contact night-coverage [email or web address] Password [facility]  01/04/2019, 5:49 AM

## 2019-01-04 NOTE — Progress Notes (Signed)
ANTICOAGULATION CONSULT NOTE - Initial Consult  Pharmacy Consult for Heparin Indication: atrial fibrillation  Allergies  Allergen Reactions  . Latex Other (See Comments)    REDNESS AT REACTION SITE.  Marland Kitchen Metformin Diarrhea  . Rivaroxaban Other (See Comments)    Headaches, blurred vision  . Other Other (See Comments)    BEE STINGS   . Sotalol Other (See Comments)    "BLUE TOES"- cut his circulation off  . Warfarin Sodium Other (See Comments)    REACTION: migraine headaches/vision impariment    Patient Measurements: Height: 6\' 1"  (185.4 cm) Weight: 204 lb 12.9 oz (92.9 kg) IBW/kg (Calculated) : 79.9  Vital Signs: Temp: 98.1 F (36.7 C) (12/05 0537) Temp Source: Oral (12/05 0537) BP: 118/61 (12/05 0537) Pulse Rate: 67 (12/05 0537)  Labs: Recent Labs    01/03/19 1548 01/03/19 1755 01/04/19 0500  HGB 14.4  --  12.7*  HCT 43.6  --  37.6*  PLT 138*  --  123*  APTT  --   --  38*  LABPROT  --   --  16.2*  INR  --   --  1.3*  HEPARINUNFRC  --   --  1.54*  CREATININE 0.72  --  0.92  TROPONINIHS 10 11  --     Estimated Creatinine Clearance: 66.3 mL/min (by C-G formula based on SCr of 0.92 mg/dL).   Medical History: Past Medical History:  Diagnosis Date  . Acute on chronic combined systolic (congestive) and diastolic (congestive) heart failure ([place])   . AICD (automatic cardioverter/defibrillator) present    Medtronicn PPM/ICD  . Carotid stenosis, right    s/p endarterectomy 11/07/18  . Chronic combined systolic (congestive) and diastolic (congestive) heart failure ([place])   . Coronary artery disease    [facility]  . Diabetes mellitus   . Dysrhythmia   . Endocarditis, valve unspecified    Mitral  . Headache    hx migraines 6-7 x mo  . History of migraine headaches   . History of seasonal allergies   . ICD (implantable cardiac defibrillator) in place   . PAF (paroxysmal atrial fibrillation) ([place])    NO LAA occlusion at the time of surgery   [name] 2018  . Pleural effusion   . PVC's (premature ventricular contractions)   . Ventricular tachycardia, polymorphic (HCC)    status post ICD implantation    Medications:  Medications Prior to Admission  Medication Sig Dispense Refill Last Dose  . acetaminophen (TYLENOL) 325 MG tablet Take 2 tablets (650 mg total) by mouth every 6 (six) hours as needed for mild pain (or Fever >/= 101). (Patient not taking: Reported on 11/11/2018)     . amiodarone (PACERONE) 200 MG tablet Take 1 tablet (200 mg total) TWICE daily for one month, then reduce and take 1 tablet (200 mg daily) ONCE daily (Patient taking differently: Take 200 mg by mouth See admin instructions. Take 1 tablet (200 mg total) TWICE daily for one month, then reduce and take 1 tablet (200 mg daily) ONCE daily) 60 tablet 6   . apixaban (ELIQUIS) 5 MG TABS tablet Take 1 tablet (5 mg total) by mouth 2 (two) times daily. 180 tablet 1   . atorvastatin (LIPITOR) 80 MG tablet Take 80 mg by mouth at bedtime.      . Cholecalciferol (VITAMIN D-3) 5000 UNITS TABS Take 5,000-10,000 Units by mouth See admin instructions. Take 5000 units by mouth on Sunday, Tuesday, Thursday, and Saturday. Take 10000 units by  mouth on Monday, Wednesday, and Friday     . EPIPEN 2-PAK 0.3 MG/0.3ML SOAJ injection Inject 0.3 mg into the muscle daily as needed for anaphylaxis. USE ONLY IN EMERGENCY     . fluticasone (FLONASE) 50 MCG/ACT nasal spray Place 1 spray into the nose daily as needed for rhinitis or allergies.     . furosemide (LASIX) 20 MG tablet Take 1 tablet (20 mg total) by mouth daily as needed for fluid or edema.     Marland Kitchen ibuprofen (ADVIL) 200 MG tablet Take 400 mg by mouth every 6 (six) hours as needed for headache.     . Magnesium 250 MG TABS Take 250 mg by mouth daily.      . metoprolol succinate (TOPROL-XL) 50 MG 24 hr tablet Take 1 tablet (50 mg total) by mouth 2 (two) times daily. Take with or immediately following a meal. (Patient taking differently: Take  50 mg by mouth daily. Take with or immediately following a meal.) 180 tablet 3   . Multiple Vitamin (MULTIVITAMIN WITH MINERALS) TABS Take 1 tablet by mouth daily. Centrum Silver     . nateglinide (STARLIX) 120 MG tablet Take 120 mg by mouth Twice daily.      . pioglitazone (ACTOS) 45 MG tablet Take 45 mg by mouth daily.       . polyethylene glycol (MIRALAX / GLYCOLAX) 17 g packet Take 17 g by mouth daily as needed for mild constipation. 14 each 0   . sitaGLIPtin (JANUVIA) 100 MG tablet Take 100 mg by mouth at bedtime.      . Tiotropium Bromide-Olodaterol (STIOLTO RESPIMAT) 2.5-2.5 MCG/ACT AERS Inhale 2 puffs into the lungs daily. 1 Inhaler 5   . triamcinolone cream (KENALOG) 0.1 % APPLY TO RASH TWICE DAILY       Assessment: 85 y.o. male admitted with abdominal pain, h/o Afib and Eliquis on hold, for heparin  Goal of Therapy:  Heparin level 0.3-0.7 units/ml Monitor platelets by anticoagulation protocol: Yes   Plan:  Start heparin 1250 units/hr APTT in 8 hours  Branden Vine, Bronson Curb 01/04/2019,6:32 AM

## 2019-01-04 NOTE — Progress Notes (Signed)
[place] for Heparin Indication: atrial fibrillation  Allergies  Allergen Reactions  . Bee Venom Anaphylaxis  . Latex Other (See Comments)    REDNESS AT REACTION SITE.  Marland Kitchen Metformin Diarrhea  . Rivaroxaban Other (See Comments)    Headaches, blurred vision  . Sotalol Other (See Comments)    "BLUE TOES"- cut his circulation off  . Warfarin Sodium Other (See Comments)    REACTION: migraine headaches/vision impariment    Patient Measurements: Height: 6\' 1"  (185.4 cm) Weight: 204 lb 12.9 oz (92.9 kg) IBW/kg (Calculated) : 79.9  Vital Signs: Temp: 98.5 F (36.9 C) (12/05 1829) Temp Source: Oral (12/05 1829) BP: 113/83 (12/05 1829) Pulse Rate: 96 (12/05 1345)  Labs: Recent Labs    01/03/19 1548 01/03/19 1755 01/04/19 0500 01/04/19 0844 01/04/19 1814  HGB 14.4  --  12.7*  --   --   HCT 43.6  --  37.6*  --   --   PLT 138*  --  123*  --   --   APTT  --   --  38*  --  98*  LABPROT  --   --  16.2*  --   --   INR  --   --  1.3*  --   --   HEPARINUNFRC  --   --  1.54*  --   --   CREATININE 0.72  --  0.92  --   --   TROPONINIHS 10 11 20* 15  --     Estimated Creatinine Clearance: 66.3 mL/min (by C-G formula based on SCr of 0.92 mg/dL).    Assessment: 85 y.o. male admitted with abdominal pain, h/o Afib and Eliquis on hold now on heparin -aPTT at goal  Goal of Therapy:  Heparin level 0.3-0.7 units/ml Monitor platelets by anticoagulation protocol: Yes   Plan:  -No heparin changes needed -Check aPTT and heparin level in am  Hildred Laser, PharmD Clinical Pharmacist **Pharmacist phone directory can now be found on [email or web address] ([email or web address]).  Listed under [place].

## 2019-01-05 DIAGNOSIS — I714 Abdominal aortic aneurysm, without rupture: Secondary | ICD-10-CM

## 2019-01-05 DIAGNOSIS — I5043 Acute on chronic combined systolic (congestive) and diastolic (congestive) heart failure: Secondary | ICD-10-CM

## 2019-01-05 LAB — COMPREHENSIVE METABOLIC PANEL
ALT: 96 U/L — ABNORMAL HIGH (ref 0–44)
AST: 60 U/L — ABNORMAL HIGH (ref 15–41)
Albumin: 2.7 g/dL — ABNORMAL LOW (ref 3.5–5.0)
Alkaline Phosphatase: 131 U/L — ABNORMAL HIGH (ref 38–126)
Anion gap: 8 (ref 5–15)
BUN: 18 mg/dL (ref 8–23)
CO2: 24 mmol/L (ref 22–32)
Calcium: 8 mg/dL — ABNORMAL LOW (ref 8.9–10.3)
Chloride: 97 mmol/L — ABNORMAL LOW (ref 98–111)
Creatinine, Ser: 0.98 mg/dL (ref 0.61–1.24)
GFR calc Af Amer: >60 mL/min (ref >60)
GFR calc non Af Amer: >60 mL/min (ref >60)
Glucose, Bld: 189 mg/dL — ABNORMAL HIGH (ref 70–99)
Potassium: 4.2 mmol/L (ref 3.5–5.1)
Sodium: 129 mmol/L — ABNORMAL LOW (ref 135–145)
Total Bilirubin: 2.8 mg/dL — ABNORMAL HIGH (ref 0.3–1.2)
Total Protein: 5.7 g/dL — ABNORMAL LOW (ref 6.5–8.1)

## 2019-01-05 LAB — GLUCOSE, CAPILLARY
Glucose-Capillary: 157 mg/dL — ABNORMAL HIGH (ref 70–99)
Glucose-Capillary: 120 mg/dL — ABNORMAL HIGH (ref 70–99)
Glucose-Capillary: 135 mg/dL — ABNORMAL HIGH (ref 70–99)
Glucose-Capillary: 115 mg/dL — ABNORMAL HIGH (ref 70–99)

## 2019-01-05 LAB — APTT
aPTT: 158 seconds — ABNORMAL HIGH (ref 24–36)
aPTT: 85 s — ABNORMAL HIGH (ref 24–36)

## 2019-01-05 LAB — CBC
HCT: 34.9 % — ABNORMAL LOW (ref 39.0–52.0)
Hemoglobin: 12 g/dL — ABNORMAL LOW (ref 13.0–17.0)
MCH: 35 pg — ABNORMAL HIGH (ref 26.0–34.0)
MCHC: 34.4 g/dL (ref 30.0–36.0)
MCV: 101.7 fL — ABNORMAL HIGH (ref 80.0–100.0)
Platelets: 107 10*3/uL — ABNORMAL LOW (ref 150–400)
RBC: 3.43 MIL/uL — ABNORMAL LOW (ref 4.22–5.81)
RDW: 13.7 % (ref 11.5–15.5)
WBC: 12.7 10*3/uL — ABNORMAL HIGH (ref 4.0–10.5)
nRBC: 0 % (ref 0.0–0.2)

## 2019-01-05 LAB — HEPARIN LEVEL (UNFRACTIONATED): Heparin Unfractionated: 1.38 IU/mL — ABNORMAL HIGH (ref 0.30–0.70)

## 2019-01-05 LAB — MAGNESIUM: Magnesium: 1.7 mg/dL (ref 1.7–2.4)

## 2019-01-05 MED ORDER — DOFETILIDE 500 MCG PO CAPS
500.0000 ug | ORAL_CAPSULE | Freq: Two times a day (BID) | ORAL | Status: DC
  Administered 2019-01-05 – 2019-01-06 (×4): 500 ug via ORAL
  Filled 2019-01-05 (×5): qty 1

## 2019-01-05 MED ORDER — METOPROLOL SUCCINATE ER 25 MG PO TB24: 50.0000 mg | ORAL_TABLET | Freq: Every day | ORAL | Status: DC

## 2019-01-05 MED ORDER — HEPARIN (PORCINE) 25000 UT/250ML-% IV SOLN
1100.0000 [IU]/h | INTRAVENOUS | Status: DC
  Administered 2019-01-05 – 2019-01-07 (×3): 1000 [IU]/h via INTRAVENOUS
  Administered 2019-01-08: 1100 [IU]/h via INTRAVENOUS
  Filled 2019-01-05 (×4): qty 250

## 2019-01-05 MED ORDER — IBUPROFEN 400 MG PO TABS
400.0000 mg | ORAL_TABLET | Freq: Four times a day (QID) | ORAL | Status: DC | PRN
  Administered 2019-01-05: 400 mg via ORAL
  Filled 2019-01-05: qty 1

## 2019-01-05 MED ORDER — MAGNESIUM SULFATE 2 GM/50ML IV SOLN
2.0000 g | Freq: Once | INTRAVENOUS | Status: AC
  Administered 2019-01-05: 2 g via INTRAVENOUS
  Filled 2019-01-05: qty 50

## 2019-01-05 MED ORDER — SODIUM CHLORIDE 0.9 % IV SOLN
INTRAVENOUS | Status: DC
  Administered 2019-01-05 – 2019-01-10 (×6): via INTRAVENOUS

## 2019-01-05 MED ORDER — METOPROLOL SUCCINATE ER 25 MG PO TB24
50.0000 mg | ORAL_TABLET | Freq: Every day | ORAL | Status: DC
  Administered 2019-01-06 – 2019-01-14 (×9): 50 mg via ORAL
  Filled 2019-01-05 (×9): qty 2

## 2019-01-05 NOTE — Progress Notes (Signed)
[place] for Heparin Indication: atrial fibrillation  Allergies  Allergen Reactions  . Bee Venom Anaphylaxis  . Latex Other (See Comments)    REDNESS AT REACTION SITE.  Marland Kitchen Metformin Diarrhea  . Rivaroxaban Other (See Comments)    Headaches, blurred vision  . Sotalol Other (See Comments)    "BLUE TOES"- cut his circulation off  . Warfarin Sodium Other (See Comments)    REACTION: migraine headaches/vision impariment    Patient Measurements: Height: 6\' 1"  (185.4 cm) Weight: 204 lb 12.9 oz (92.9 kg) IBW/kg (Calculated) : 79.9  Vital Signs: Temp: 98.3 F (36.8 C) (12/06 0537) Temp Source: Oral (12/06 0537) BP: 118/63 (12/06 0537) Pulse Rate: 49 (12/06 0537)  Labs: Recent Labs    01/03/19 1548 01/03/19 1755 01/04/19 0500 01/04/19 0844 01/04/19 1814 01/05/19 0343  HGB 14.4  --  12.7*  --   --   --   HCT 43.6  --  37.6*  --   --   --   PLT 138*  --  123*  --   --   --   APTT  --   --  38*  --  98* 158*  LABPROT  --   --  16.2*  --   --   --   INR  --   --  1.3*  --   --   --   HEPARINUNFRC  --   --  1.54*  --   --  1.38*  CREATININE 0.72  --  0.92  --   --  0.98  TROPONINIHS 10 11 20* 15  --   --     Estimated Creatinine Clearance: 62.3 mL/min (by C-G formula based on SCr of 0.98 mg/dL).    Assessment: 85 y.o. male admitted with abdominal pain, h/o Afib and Eliquis on hold now on heparin  12/6 AM update:  Heparin level and aPTT elevated, using aPTT to dose for now given Apixaban influence on heparin levels  Goal of Therapy:  Heparin level 0.3-0.7 units/ml Monitor platelets by anticoagulation protocol: Yes   Plan:  -Hold heparin x 1 hr -Re-start heparin drip at 1000 units/hr at 0700 -1500 aPTT  Narda Bonds, PharmD, [place] Pharmacist Phone: [phone]

## 2019-01-05 NOTE — Progress Notes (Signed)
Subjective: Abdominal pain with trial clear liquids. Having abdominal distention.  Objective: Vital signs in last 24 hours: Temp:  [98.3 F (36.8 C)-99 F (37.2 C)] 98.3 F (36.8 C) (12/06 0537) Pulse Rate:  [49-96] 49 (12/06 0537) Resp:  [16-18] 17 (12/06 0537) BP: (103-126)/(63-83) 118/63 (12/06 0537) SpO2:  [93 %-96 %] 93 % (12/06 0537) Weight:  [92.9 kg] 92.9 kg (12/06 0500) Weight change: -0.541 kg Last BM Date: 01/03/19  PE: GEN:  NAD ABD:  Moderate distention with tympany, scant bowel sounds, diffuse tender but no peritonitis  Lab Results: CBC    Component Value Date/Time   WBC 12.7 (H) 01/05/2019 0343   RBC 3.43 (L) 01/05/2019 0343   HGB 12.0 (L) 01/05/2019 0343   HGB 12.9 (L) 12/02/2018 1535   HCT 34.9 (L) 01/05/2019 0343   HCT 38.4 12/02/2018 1535   PLT 107 (L) 01/05/2019 0343   PLT 125 (L) 12/02/2018 1535   MCV 101.7 (H) 01/05/2019 0343   MCV 101 (H) 12/02/2018 1535   MCH 35.0 (H) 01/05/2019 0343   MCHC 34.4 01/05/2019 0343   RDW 13.7 01/05/2019 0343   RDW 13.1 12/02/2018 1535   LYMPHSABS 0.5 (L) 01/04/2019 0500   LYMPHSABS 0.9 12/27/2016 1523   MONOABS 0.8 01/04/2019 0500   EOSABS 0.1 01/04/2019 0500   EOSABS 0.2 12/27/2016 1523   BASOSABS 0.0 01/04/2019 0500   BASOSABS 0.0 12/27/2016 1523   CMP     Component Value Date/Time   NA 129 (L) 01/05/2019 0343   NA 133 (L) 12/02/2018 0000   K 4.2 01/05/2019 0343   CL 97 (L) 01/05/2019 0343   CO2 24 01/05/2019 0343   GLUCOSE 189 (H) 01/05/2019 0343   BUN 18 01/05/2019 0343   BUN 10 12/02/2018 0000   CREATININE 0.98 01/05/2019 0343   CREATININE 1.08 01/04/2016 1630   CALCIUM 8.0 (L) 01/05/2019 0343   PROT 5.7 (L) 01/05/2019 0343   PROT 6.9 10/23/2018 1454   ALBUMIN 2.7 (L) 01/05/2019 0343   ALBUMIN 4.4 10/23/2018 1454   AST 60 (H) 01/05/2019 0343   ALT 96 (H) 01/05/2019 0343   ALKPHOS 131 (H) 01/05/2019 0343   BILITOT 2.8 (H) 01/05/2019 0343   BILITOT 1.4 (H) 10/23/2018 1454   GFRNONAA >60  01/05/2019 0343   GFRAA >60 01/05/2019 0343   Assessment:  1.  Gallstone pancreatitis.  Clinically worse-appearing today but is not toxic.   2.  Elevated LFTs.  TB slight increase, transaminases slight decrease. 3.  Abdominal pain, due to #1 above.  Much improved. 4.  CAD with multiple comorbidities, including newly diagnosed AAA. 5.  Chronic anticoagulation, apixaban. 6.  Recent TIAs, with recent carotid endarterectomy couple months ago.  Plan:  1.  Continue medical management (IVF, analgesics, antibiotics). 2.  If patient's symptoms don't improve over the next 2-3 days, would need to consider repeat CT scan. 3.  Patient in no clinical shape for consideration of surgical or endoscopic intervention at the present time. 4.  [facility] will follow.    Landry Dyke 01/05/2019, 1:09 PM   Cell [phone] If no answer or after 5 PM call [phone]

## 2019-01-05 NOTE — Progress Notes (Signed)
CCMD called and notified this RN that the pt. had a 7 beat run of wide QRS V tach. Pt. resting comfortably at this time. Notified on call NP Bodenheimer.

## 2019-01-05 NOTE — Progress Notes (Signed)
Patient ID: [name], male   DOB: 08/19/1931, 85 y.o.   MRN: [id number]  PROGRESS NOTE    [name]  [id number] DOB: 1931/06/15 DOA: 01/03/2019 PCP: [name], MD   Brief Narrative:  85 year old male with history of CAD status post CABG, chronic systolic heart failure status post AICD placement, A. fib on apixaban and [name] and recent reaction to amiodarone, recent carotid endarterectomy, recent admission for CHF in October 2020 presented on 01/03/2019 with abdominal pain.  He was found to have elevated LFTs and bilirubin along with elevated lipase.  CT of the abdomen and pelvis showed features concerning for acute pancreatitis with dilated CBD and gallstones with ductal enhancement concerning for infection.  He was started on IV fluids and antibiotics.  GI was consulted.  Assessment & Plan:   Acute biliary pancreatitis Cholelithiasis with dilated CBD -CT of the abdomen pelvis showed features concerning for acute pancreatitis with dilated CBD and gallstones with ductal enhancement concerning for infection -Currently being managed with IV fluids and analgesics.  Had some nausea with clear liquid diet yesterday. -Change IV fluids to normal saline at 75 cc an hour. -Patient cannot have an MRCP because of AICD. -GI following.  Will follow up with further recommendations from GI regarding timing of ERCP versus cholecystectomy by general surgery.  Will trend LFTs.  Bilirubin slightly increased to 2.8 today. -Currently on empiric broad-spectrum antibiotics.    Leukocytosis -Probably from.  Monitor  Infrarenal abdominal aortic aneurysm measuring 5.7 cm with no dissection -We will vascular surgery  Paroxysmal A. fib -Currently rate controlled.  Eliquis on hold.  Continue heparin drip for now. -We will restart [name]  Hyponatremia in a patient with history of recent diagnosis of SIADH -Monitor sodium level closely.  IV fluid as above.  Chronic systolic heart  failure -Last EF of 35 to 40% in October 2020. -Currently no signs of fluid overload.  Strict input output and daily weights.  Diabetes mellitus type 2 -Continue CBGs with SSI  Thrombocytopenia -Questionable cause.  Monitor  Macrocytic anemia -Questionable cause.  Monitor.  Will check vitamin B12, folate and TSH.  Generalized deconditioning -We will request PT evaluation  DVT prophylaxis: Heparin Code Status: Full Family Communication: Discussed with patient  disposition Plan: Depends on clinical outcome  Consultants: Gastroenterology.  Vascular surgery Procedures: None Antimicrobials: Zosyn from 01/03/2019 onwards   Subjective: Patient seen and examined at bedside.  He is still having abdominal pain, mostly on the lower abdomen with intermittent nausea.  Had nausea with clear liquid diet yesterday.  No overnight fever or vomiting.  Objective: Vitals:   01/04/19 1829 01/04/19 2119 01/05/19 0500 01/05/19 0537  BP: 113/83 103/66  118/63  Pulse:  70  (!) 49  Resp: 18 16  17   Temp: 98.5 F (36.9 C) 99 F (37.2 C)  98.3 F (36.8 C)  TempSrc: Oral Oral  Oral  SpO2: 94% 94%  93%  Weight:   92.9 kg   Height:        Intake/Output Summary (Last 24 hours) at 01/05/2019 0936 Last data filed at 01/05/2019 0537 Gross per 24 hour  Intake 3049.32 ml  Output 750 ml  Net 2299.32 ml   Filed Weights   01/03/19 1415 01/04/19 0242 01/05/19 0500  Weight: 93.4 kg 92.9 kg 92.9 kg    Examination:  General exam: Appears calm and comfortable. Respiratory system: Bilateral decreased breath sounds at bases with some scattered crackles Cardiovascular system: S1 & S2 heard, Rate controlled Gastrointestinal  system: Abdomen is nondistended, soft and mildly tender in the lower quadrant as well as epigastric regions. Normal bowel sounds heard. Extremities: No cyanosis, clubbing; trace edema Central nervous system: Alert and oriented. No focal neurological deficits. Moving extremities Skin:  No rashes, lesions or ulcers Psychiatry: Judgement and insight appear normal. Mood & affect appropriate.     Data Reviewed: I have personally reviewed following labs and imaging studies  CBC: Recent Labs  Lab 01/03/19 1548 01/04/19 0500 01/05/19 0343  WBC 9.0 11.8* 12.7*  NEUTROABS 7.8* 10.3*  --   HGB 14.4 12.7* 12.0*  HCT 43.6 37.6* 34.9*  MCV 103.3* 102.5* 101.7*  PLT 138* 123* [id number]*   Basic Metabolic Panel: Recent Labs  Lab 01/03/19 1548 01/04/19 0500 01/05/19 0343  NA 128* 131* 129*  K 4.0 4.6 4.2  CL 95* 99 97*  CO2 23 24 24   GLUCOSE 171* 133* 189*  BUN 17 13 18   CREATININE 0.72 0.92 0.98  CALCIUM 8.8* 8.4* 8.0*  MG  --  1.9 1.7   GFR: Estimated Creatinine Clearance: 62.3 mL/min (by C-G formula based on SCr of 0.98 mg/dL). Liver Function Tests: Recent Labs  Lab 01/03/19 1548 01/04/19 0500 01/05/19 0343  AST 224* 118* 60*  ALT 250* 157* 96*  ALKPHOS 253* 190* 131*  BILITOT 3.1* 2.5* 2.8*  PROT 7.5 6.2* 5.7*  ALBUMIN 4.2 3.3* 2.7*   Recent Labs  Lab 01/03/19 1548 01/04/19 0500  LIPASE 2,777* 960*   No results for input(s): AMMONIA in the last 168 hours. Coagulation Profile: Recent Labs  Lab 01/04/19 0500  INR 1.3*   Cardiac Enzymes: No results for input(s): CKTOTAL, CKMB, CKMBINDEX, TROPONINI in the last 168 hours. BNP (last 3 results) No results for input(s): PROBNP in the last 8760 hours. HbA1C: No results for input(s): HGBA1C in the last 72 hours. CBG: Recent Labs  Lab 01/04/19 0735 01/04/19 1146 01/04/19 1555 01/04/19 2115 01/05/19 0741  GLUCAP 111* 97 136* 183* 157*   Lipid Profile: No results for input(s): CHOL, HDL, LDLCALC, TRIG, CHOLHDL, LDLDIRECT in the last 72 hours. Thyroid Function Tests: No results for input(s): TSH, T4TOTAL, FREET4, T3FREE, THYROIDAB in the last 72 hours. Anemia Panel: No results for input(s): VITAMINB12, FOLATE, FERRITIN, TIBC, IRON, RETICCTPCT in the last 72 hours. Sepsis Labs: No results for  input(s): PROCALCITON, LATICACIDVEN in the last 168 hours.  Recent Results (from the past 240 hour(s))  Urine culture     Status: None (Preliminary result)   Collection Time: 01/03/19  2:17 PM   Specimen: Urine, Random  Result Value Ref Range Status   Specimen Description   Final    URINE, RANDOM Performed at [facility], [address] [place], [place] [address]    Special Requests   Final    NONE Performed at [facility], [address] [place], [place] [address]    Culture   Final    CULTURE REINCUBATED FOR BETTER GROWTH Performed at [facility], [place] [address], [place], [place] [address]    Report Status PENDING  Incomplete  SARS CORONAVIRUS 2 (TAT 6-24 HRS) Nasopharyngeal Nasopharyngeal Swab     Status: None   Collection Time: 01/03/19  8:36 PM   Specimen: Nasopharyngeal Swab  Result Value Ref Range Status   SARS Coronavirus 2 NEGATIVE NEGATIVE Final    Comment: (NOTE) SARS-CoV-2 target nucleic acids are NOT DETECTED. The SARS-CoV-2 RNA is generally detectable in upper and lower respiratory specimens during the  acute phase of infection. Negative results do not preclude SARS-CoV-2 infection, do not rule out co-infections with other pathogens, and should not be used as the sole basis for treatment or other patient management decisions. Negative results must be combined with clinical observations, patient history, and epidemiological information. The expected result is Negative. Fact Sheet for Patients: SugarRoll.be Fact Sheet for Healthcare Providers: https://www.woods-mathews.com/ This test is not yet approved or cleared by the [place] FDA and  has been authorized for detection and/or diagnosis of SARS-CoV-2 by FDA under an Emergency Use Authorization (EUA). This EUA will remain  in effect (meaning this test can be used) for the duration of the COVID-19 declaration under Section 56 4(b)(1)  of the Act, 21 U.S.C. section 360bbb-3(b)(1), unless the authorization is terminated or revoked sooner. Performed at [facility], [place] [address], [place], [place] [address]          Radiology Studies: Ct Angio Chest/abd/pel For Dissection W And/or Wo Contrast  Result Date: 01/03/2019 CLINICAL DATA:  Severe chest pain EXAM: CT ANGIOGRAPHY CHEST, ABDOMEN AND PELVIS TECHNIQUE: Multidetector CT imaging through the chest, abdomen and pelvis was performed using the standard protocol during bolus administration of intravenous contrast. Multiplanar reconstructed images and MIPs were obtained and reviewed to evaluate the vascular anatomy. CONTRAST:  179mL OMNIPAQUE IOHEXOL 350 MG/ML SOLN COMPARISON:  12/31/2002, ultrasound 01/03/2008 FINDINGS: CTA CHEST FINDINGS Cardiovascular: Non contrasted images of the chest demonstrate no acute intramural hematoma. Aorta is nonaneurysmal. Moderate aortic atherosclerosis. Left-sided pacing device with partially visualized intracardiac leads. Extensive coronary vascular calcification. No significant pericardial effusion. Mediastinum/Nodes: Midline trachea. No thyroid mass. No significant adenopathy. Esophagus within normal limits. Lungs/Pleura: No acute consolidation or effusion. No pneumothorax. Bandlike scar atelectasis in the left upper lobe Musculoskeletal: Scoliosis and degenerative changes of the spine. Post sternotomy changes. No acute or suspicious osseous abnormality. Review of the MIP images confirms the above findings. CTA ABDOMEN AND PELVIS FINDINGS VASCULAR Aorta: Moderate aortic atherosclerosis. Fusiform infrarenal abdominal aortic aneurysm measuring 5.7 cm maximum on axial images over a 8.2 cm craniocaudad length. Incompletely opacified aneurysm sac with probable mural thrombus along the anterior inferior sac. No definitive dissection. No retroperitoneal hematoma. Celiac: Mild calcification at the origin without high-grade stenosis. Negative for  occlusion or aneurysm. SMA: Calcification at the origin without significant stenosis Renals: Single right and single left renal arteries without significant stenosis. IMA: Not well visualized.  Inadequately opacified. Inflow: Moderate aortic atherosclerosis without occlusion or high-grade stenosis. Veins: No obvious venous abnormality within the limitations of this arterial phase study. Review of the MIP images confirms the above findings. NON-VASCULAR Hepatobiliary: No focal hepatic abnormality. Multiple calcified gallstones. Mild enlargement of the extrahepatic common bile duct, measuring up to 13 mm. No calcified stones along the course of the duct. Mild ductal enhancement. Pancreas: Peripancreatic fluid and edema consistent with acute pancreatitis. No organized fluid collections. Spleen: Normal in size without focal abnormality. Adrenals/Urinary Tract: Adrenal glands are normal. No hydronephrosis. Cyst in the mid left kidney. The bladder is normal Stomach/Bowel: Stomach is within normal limits. Appendix appears normal. No evidence of bowel wall thickening, distention, or inflammatory changes. Sigmoid colon diverticular disease without acute inflammatory change Lymphatic: No significant adenopathy Reproductive: Prostate is unremarkable. Other: Fat containing inguinal hernias. No free air. Small amount of free fluid within the abdomen and pelvis. Musculoskeletal: Chronic appearing superior endplate deformity at L4. Diffuse degenerative changes. Review of the MIP images confirms the above findings. IMPRESSION: 1. Negative for acute aortic dissection. 5.7  cm fusiform aneurysm within the infrarenal abdominal aorta without evidence for retroperitoneal hematoma. Vascular surgery consultation recommended due to increased risk of rupture for AAA >5.5 cm. This recommendation follows ACR consensus guidelines: White Paper of the ACR Incidental Findings Committee II on Vascular Findings. J Am Coll Radiol 2013; 10:789-794.  Aortic aneurysm NOS (ICD10-I71.9) 2. Peripancreatic fluid and edema, suspicious for an acute pancreatitis. Recommend correlation with enzymes. Small free fluid within the abdomen and pelvis. 3. Gallstones. Dilated common bile duct up to 13 mm which should be correlated with LFT. There may be mild ductal enhancement and biliary infection cannot be excluded. 4. Sigmoid colon diverticular disease without acute inflammatory change Electronically Signed   By: Donavan Foil M.D.   On: 01/03/2019 19:33        Scheduled Meds:  insulin aspart  0-5 Units Subcutaneous QHS   insulin aspart  0-9 Units Subcutaneous TID WC   metoprolol tartrate  5 mg Intravenous Q6H   sodium chloride flush  10-40 mL Intracatheter Q12H   Continuous Infusions:  sodium chloride 75 mL/hr at 01/05/19 0830   heparin 1,000 Units/hr (01/05/19 0728)   piperacillin-tazobactam (ZOSYN)  IV 3.375 g (01/05/19 0552)          Aline August, MD [facility] 01/05/2019, 9:36 AM

## 2019-01-05 NOTE — Progress Notes (Signed)
[place] for Heparin Indication: atrial fibrillation  Allergies  Allergen Reactions  . Bee Venom Anaphylaxis  . Latex Other (See Comments)    REDNESS AT REACTION SITE.  Marland Kitchen Metformin Diarrhea  . Rivaroxaban Other (See Comments)    Headaches, blurred vision  . Sotalol Other (See Comments)    "BLUE TOES"- cut his circulation off  . Warfarin Sodium Other (See Comments)    REACTION: migraine headaches/vision impariment    Patient Measurements: Height: 6\' 1"  (185.4 cm) Weight: 204 lb 12.9 oz (92.9 kg) IBW/kg (Calculated) : 79.9  Vital Signs: Temp: 99 F (37.2 C) (12/06 1319) Temp Source: Oral (12/06 1319) BP: 132/85 (12/06 1319) Pulse Rate: 97 (12/06 1319)  Labs: Recent Labs    01/03/19 1548 01/03/19 1755  01/04/19 0500 01/04/19 0844 01/04/19 1814 01/05/19 0343 01/05/19 1655  HGB 14.4  --   --  12.7*  --   --  12.0*  --   HCT 43.6  --   --  37.6*  --   --  34.9*  --   PLT 138*  --   --  123*  --   --  107*  --   APTT  --   --    < > 38*  --  98* 158* 85*  LABPROT  --   --   --  16.2*  --   --   --   --   INR  --   --   --  1.3*  --   --   --   --   HEPARINUNFRC  --   --   --  1.54*  --   --  1.38*  --   CREATININE 0.72  --   --  0.92  --   --  0.98  --   TROPONINIHS 10 11  --  20* 15  --   --   --    < > = values in this interval not displayed.    Estimated Creatinine Clearance: 62.3 mL/min (by C-G formula based on SCr of 0.98 mg/dL).    Assessment: 85 y.o. male admitted with abdominal pain, h/o Afib and Eliquis on hold now on heparin -aPTT at hold after holding and decreasing infusion  Goal of Therapy:  Heparin level 0.3-0.7 units/ml  APTT 66-102 Monitor platelets by anticoagulation protocol: Yes   Plan:  -No heparin changes needed -aPTT in 6 hours and daily wth CBC daily  Hildred Laser, PharmD Clinical Pharmacist **Pharmacist phone directory can now be found on [email or web address] ([email or web address]).  Listed under [place].

## 2019-01-05 NOTE — Consult Note (Signed)
REASON FOR CONSULT:    5.7 cm abdominal aortic aneurysm.  The consult is requested by Dr. Aline August.   ASSESSMENT & PLAN:   5.7 CM INFRARENAL ABDOMINAL AORTIC ANEURYSM: This patient was admitted with gallstone pancreatitis.  An incidental finding on his CT scan was a 5.7 cm infrarenal abdominal aortic aneurysm.  This is asymptomatic.  I have explained that in a normal risk patient we would consider repair at 5.5 cm.  Once he has recovered from his gallstone pancreatitis then he will be considered for elective repair of his aneurysm by Dr. Donzetta Matters who recently did his carotid endarterectomy.  Based on my review of the CT scan he does appear to be a good candidate for an endovascular repair which would certainly be ideal given his multiple medical comorbidities.  However I have explained that we have to be sure that he is fully recovered from his gallstone pancreatitis before considering repair of his aneurysm given the risk of graft infection.  I will notify Dr. Servando Snare of the patient's admission.  Deitra Mayo, MD Office: [phone]   HPI:   [name] is a pleasant 85 y.o. male, who was admitted on 01/04/2019 with abdominal pain.  He was found to have acute pancreatitis and was ultimately found to have gallstone pancreatitis.  He underwent a CT scan and an incidental finding was a 5.7 cm infrarenal abdominal aortic aneurysm.  He has not undergone any intervention yet for his gallstone pancreatitis.  I believe that his gastroenterologist is going to be back tomorrow and will be evaluating him.  He denies any chronic back pain.  He has no family history of aneurysmal disease.  Of note he is known to our service.  He underwent a right carotid endarterectomy by Dr. Servando Snare on 11/07/2018.  This was a symptomatic right carotid stenosis.  He had 2 episodes of amaurosis fugax.  He has multiple medical comorbidities including a history of coronary artery disease, chronic systolic  congestive heart failure, history of AICD, A. fib on Eliquis, and diabetes.  Past Medical History:  Diagnosis Date  . Acute on chronic combined systolic (congestive) and diastolic (congestive) heart failure ([place])   . AICD (automatic cardioverter/defibrillator) present    [employer]n PPM/ICD  . Carotid stenosis, right    s/p endarterectomy 11/07/18  . Chronic combined systolic (congestive) and diastolic (congestive) heart failure ([place])   . Coronary artery disease    [facility]  . Diabetes mellitus   . Dysrhythmia   . Endocarditis, valve unspecified    Mitral  . Headache    hx migraines 6-7 x mo  . History of migraine headaches   . History of seasonal allergies   . ICD (implantable cardiac defibrillator) in place   . PAF (paroxysmal atrial fibrillation) ([place])    NO LAA occlusion at the time of surgery  [name] 2018  . Pleural effusion   . PVC's (premature ventricular contractions)   . Ventricular tachycardia, polymorphic ([facility])    status post ICD implantation    Family History  Problem Relation Age of Onset  . Heart disease Father   . Emphysema Father   . Stroke Mother   . Cancer Sister        breast cancer  . Heart disease Paternal Grandmother   . Heart disease Paternal Grandfather   . Diabetes Other        grandmother, aunt, uncle  . Cancer Other  lung cancer, aunt    SOCIAL HISTORY: Social History   Socioeconomic History  . Marital status: Married    Spouse name: Mardene Celeste  . Number of children: 2  . Years of education: Not on file  . Highest education level: Associate degree: academic program  Occupational History  . Occupation: retired  Scientific laboratory technician  . Financial resource strain: Not on file  . Food insecurity    Worry: Not on file    Inability: Not on file  . Transportation needs    Medical: Not on file    Non-medical: Not on file  Tobacco Use  . Smoking status: Former Smoker    Packs/day: 2.00    Years: 16.00    Pack  years: 32.00    Types: Cigarettes    Start date: 02/23/1956    Quit date: 01/31/1971    Years since quitting: 47.9  . Smokeless tobacco: Never Used  Substance and Sexual Activity  . Alcohol use: Yes    Alcohol/week: 0.0 standard drinks    Comment: 1-2 a week  . Drug use: No  . Sexual activity: Yes  Lifestyle  . Physical activity    Days per week: Not on file    Minutes per session: Not on file  . Stress: Not on file  Relationships  . Social Herbalist on phone: Not on file    Gets together: Not on file    Attends religious service: Not on file    Active member of club or organization: Not on file    Attends meetings of clubs or organizations: Not on file    Relationship status: Not on file  . Intimate partner violence    Fear of current or ex partner: Not on file    Emotionally abused: Not on file    Physically abused: Not on file    Forced sexual activity: Not on file  Other Topics Concern  . Not on file  Social History Narrative   Patient is right-handed. He lives with his wife ina 2 story home. He drinks one cup of coffee and a diet coke a day.     Allergies  Allergen Reactions  . Bee Venom Anaphylaxis  . Latex Other (See Comments)    REDNESS AT REACTION SITE.  Marland Kitchen Metformin Diarrhea  . Rivaroxaban Other (See Comments)    Headaches, blurred vision  . Sotalol Other (See Comments)    "BLUE TOES"- cut his circulation off  . Warfarin Sodium Other (See Comments)    REACTION: migraine headaches/vision impariment    Current Facility-Administered Medications  Medication Dose Route Frequency Provider Last Rate Last Dose  . 0.9 %  sodium chloride infusion   Intravenous Continuous Aline August, MD 75 mL/hr at 01/05/19 0830    . acetaminophen (TYLENOL) tablet 650 mg  650 mg Oral Q6H PRN Rise Patience, MD       Or  . acetaminophen (TYLENOL) suppository 650 mg  650 mg Rectal Q6H PRN Rise Patience, MD      . dofetilide [facility]) capsule 500 mcg  500  mcg Oral BID Aline August, MD      . fentaNYL (SUBLIMAZE) injection 25 mcg  25 mcg Intravenous Q2H PRN Rise Patience, MD   25 mcg at 01/05/19 0949  . heparin ADULT infusion 100 units/mL (25000 units/253mL sodium chloride 0.45%)  1,000 Units/hr Intravenous Continuous Erenest Blank, RPH 10 mL/hr at 01/05/19 0728 1,000 Units/hr at 01/05/19 0728  . insulin aspart (  novoLOG) injection 0-5 Units  0-5 Units Subcutaneous QHS Alekh, Kshitiz, MD      . insulin aspart (novoLOG) injection 0-9 Units  0-9 Units Subcutaneous TID WC Aline August, MD   2 Units at 01/05/19 0830  . magnesium sulfate IVPB 2 g 50 mL  2 g Intravenous Once Lyndee Leo, [facility]      . metoCLOPramide (REGLAN) injection 10 mg  10 mg Intravenous Q6H PRN Aline August, MD   10 mg at 01/04/19 1914  . [START ON 01/06/2019] metoprolol succinate (TOPROL-XL) 24 hr tablet 50 mg  50 mg Oral Daily Alekh, Kshitiz, MD      . ondansetron (ZOFRAN) tablet 4 mg  4 mg Oral Q6H PRN Rise Patience, MD       Or  . ondansetron [facility]) injection 4 mg  4 mg Intravenous Q6H PRN Rise Patience, MD   4 mg at 01/04/19 1712  . piperacillin-tazobactam (ZOSYN) IVPB 3.375 g  3.375 g Intravenous Q8H Rise Patience, MD 12.5 mL/hr at 01/05/19 0552 3.375 g at 01/05/19 0552  . sodium chloride flush (NS) 0.9 % injection 10-40 mL  10-40 mL Intracatheter Q12H Alekh, Kshitiz, MD      . sodium chloride flush (NS) 0.9 % injection 10-40 mL  10-40 mL Intracatheter PRN Starla Link, Kshitiz, MD        REVIEW OF SYSTEMS:  [X]  denotes positive finding, [ ]  denotes negative finding Cardiac  Comments:  Chest pain or chest pressure:    Shortness of breath upon exertion:    Short of breath when lying flat:    Irregular heart rhythm:        Vascular    Pain in calf, thigh, or hip brought on by ambulation:    Pain in feet at night that wakes you up from your sleep:     Blood clot in your veins:    Leg swelling:         Pulmonary    Oxygen at home:     Productive cough:     Wheezing:         Neurologic    Sudden weakness in arms or legs:     Sudden numbness in arms or legs:     Sudden onset of difficulty speaking or slurred speech:    Temporary loss of vision in one eye:     Problems with dizziness:         Gastrointestinal    Blood in stool:     Vomited blood:         Genitourinary    Burning when urinating:     Blood in urine:        Psychiatric    Major depression:         Hematologic    Bleeding problems:    Problems with blood clotting too easily:        Skin    Rashes or ulcers:        Constitutional    Fever or chills:     PHYSICAL EXAM:   Vitals:   01/04/19 1829 01/04/19 2119 01/05/19 0500 01/05/19 0537  BP: 113/83 103/66  118/63  Pulse:  70  (!) 49  Resp: 18 16  17   Temp: 98.5 F (36.9 C) 99 F (37.2 C)  98.3 F (36.8 C)  TempSrc: Oral Oral  Oral  SpO2: 94% 94%  93%  Weight:   92.9 kg   Height:        GENERAL:  The patient is a well-nourished male, in no acute distress. The vital signs are documented above. CARDIAC: There is a regular rate and rhythm.  VASCULAR: I do not detect carotid bruits. On the right side he has a palpable femoral, popliteal, and dorsalis pedis pulse. On the left side he has a palpable femoral, popliteal, and posterior tibial pulse. He has no significant lower extremity swelling. PULMONARY: There is good air exchange bilaterally without wheezing or rales. ABDOMEN: Soft and non-tender with normal pitched bowel sounds.  MUSCULOSKELETAL: There are no major deformities or cyanosis. NEUROLOGIC: No focal weakness or paresthesias are detected. SKIN: There are no ulcers or rashes noted. PSYCHIATRIC: The patient has a normal affect.  DATA:    CT ANGIOGRAM CHEST ABDOMEN PELVIS: I reviewed the CT angiogram that was done on 01/03/2019.  This was done because he was having chest pain.  It showed no evidence of acute aortic dissection.  There was a 5.7 cm infrarenal abdominal aortic  aneurysm with no evidence of retroperitoneal hematoma.  He had evidence of acute pancreatitis.  Gallstones were noted with a dilated common bile duct.  On my review of the images he does appear to be a good candidate for an endovascular repair of his aneurysm.  There is no aneurysmal disease of the iliacs.  He has an adequate neck without significant thrombus or calcification noted.  LABS: Sodium is 129 potassium 4.2.  Creatinine 0.98.  GFR is greater than 60.  White blood cell count 12.7.  Hemoglobin 12.  Platelets 107,000.

## 2019-01-05 NOTE — Plan of Care (Signed)

## 2019-01-06 ENCOUNTER — Inpatient Hospital Stay (HOSPITAL_COMMUNITY): Payer: Medicare Other

## 2019-01-06 DIAGNOSIS — I714 Abdominal aortic aneurysm, without rupture: Secondary | ICD-10-CM

## 2019-01-06 DIAGNOSIS — Z95810 Presence of automatic (implantable) cardiac defibrillator: Secondary | ICD-10-CM

## 2019-01-06 DIAGNOSIS — I480 Paroxysmal atrial fibrillation: Secondary | ICD-10-CM | POA: Diagnosis not present

## 2019-01-06 LAB — GLUCOSE, CAPILLARY
Glucose-Capillary: 157 mg/dL — ABNORMAL HIGH (ref 70–99)
Glucose-Capillary: 138 mg/dL — ABNORMAL HIGH (ref 70–99)
Glucose-Capillary: 113 mg/dL — ABNORMAL HIGH (ref 70–99)
Glucose-Capillary: 106 mg/dL — ABNORMAL HIGH (ref 70–99)

## 2019-01-06 LAB — URINE CULTURE: Culture: 20000 — AB

## 2019-01-06 LAB — COMPREHENSIVE METABOLIC PANEL
ALT: 73 U/L — ABNORMAL HIGH (ref 0–44)
AST: 43 U/L — ABNORMAL HIGH (ref 15–41)
Albumin: 2.7 g/dL — ABNORMAL LOW (ref 3.5–5.0)
Alkaline Phosphatase: 144 U/L — ABNORMAL HIGH (ref 38–126)
Anion gap: 16 — ABNORMAL HIGH (ref 5–15)
BUN: 16 mg/dL (ref 8–23)
CO2: 19 mmol/L — ABNORMAL LOW (ref 22–32)
Calcium: 8.1 mg/dL — ABNORMAL LOW (ref 8.9–10.3)
Chloride: 97 mmol/L — ABNORMAL LOW (ref 98–111)
Creatinine, Ser: 1.07 mg/dL (ref 0.61–1.24)
GFR calc Af Amer: >60 mL/min (ref >60)
GFR calc non Af Amer: >60 mL/min (ref >60)
Glucose, Bld: 169 mg/dL — ABNORMAL HIGH (ref 70–99)
Potassium: 3.7 mmol/L (ref 3.5–5.1)
Sodium: 132 mmol/L — ABNORMAL LOW (ref 135–145)
Total Bilirubin: 3.1 mg/dL — ABNORMAL HIGH (ref 0.3–1.2)
Total Protein: 6.3 g/dL — ABNORMAL LOW (ref 6.5–8.1)

## 2019-01-06 LAB — CBC
HCT: 35 % — ABNORMAL LOW (ref 39.0–52.0)
Hemoglobin: 12.1 g/dL — ABNORMAL LOW (ref 13.0–17.0)
MCH: 34.8 pg — ABNORMAL HIGH (ref 26.0–34.0)
MCHC: 34.6 g/dL (ref 30.0–36.0)
MCV: 100.6 fL — ABNORMAL HIGH (ref 80.0–100.0)
Platelets: 121 10*3/uL — ABNORMAL LOW (ref 150–400)
RBC: 3.48 MIL/uL — ABNORMAL LOW (ref 4.22–5.81)
RDW: 13.6 % (ref 11.5–15.5)
WBC: 12.4 10*3/uL — ABNORMAL HIGH (ref 4.0–10.5)
nRBC: 0 % (ref 0.0–0.2)

## 2019-01-06 LAB — APTT
aPTT: 199 seconds (ref 24–36)
aPTT: 87 seconds — ABNORMAL HIGH (ref 24–36)

## 2019-01-06 LAB — HEPARIN LEVEL (UNFRACTIONATED): Heparin Unfractionated: 0.81 IU/mL — ABNORMAL HIGH (ref 0.30–0.70)

## 2019-01-06 LAB — TROPONIN I (HIGH SENSITIVITY): Troponin I (High Sensitivity): 24 ng/L — ABNORMAL HIGH (ref <18)

## 2019-01-06 LAB — MAGNESIUM: Magnesium: 2.1 mg/dL (ref 1.7–2.4)

## 2019-01-06 MED ORDER — IOHEXOL 300 MG/ML  SOLN
100.0000 mL | Freq: Once | INTRAMUSCULAR | Status: AC | PRN
  Administered 2019-01-06: 13:00:00 100 mL via INTRAVENOUS

## 2019-01-06 MED ORDER — PANTOPRAZOLE SODIUM 40 MG IV SOLR
40.0000 mg | INTRAVENOUS | Status: DC
  Administered 2019-01-06 – 2019-01-11 (×5): 40 mg via INTRAVENOUS
  Filled 2019-01-06 (×5): qty 40

## 2019-01-06 MED ORDER — MORPHINE SULFATE (PF) 2 MG/ML IV SOLN: 2.0000 mg | INTRAVENOUS | Status: DC | PRN

## 2019-01-06 MED ORDER — POTASSIUM CHLORIDE CRYS ER 20 MEQ PO TBCR
20.0000 meq | EXTENDED_RELEASE_TABLET | Freq: Once | ORAL | Status: AC
  Administered 2019-01-06: 10:00:00 20 meq via ORAL
  Filled 2019-01-06: qty 1

## 2019-01-06 MED ORDER — ALUM & MAG HYDROXIDE-SIMETH 200-200-20 MG/5ML PO SUSP: 15.0000 mL | Freq: Four times a day (QID) | ORAL | Status: DC | PRN

## 2019-01-06 NOTE — Progress Notes (Signed)
[place] for Heparin Indication: atrial fibrillation  Allergies  Allergen Reactions  . Bee Venom Anaphylaxis  . Latex Other (See Comments)    REDNESS AT REACTION SITE.  Marland Kitchen Metformin Diarrhea  . Rivaroxaban Other (See Comments)    Headaches, blurred vision  . Sotalol Other (See Comments)    "BLUE TOES"- cut his circulation off  . Warfarin Sodium Other (See Comments)    REACTION: migraine headaches/vision impariment    Patient Measurements: Height: 6\' 1"  (185.4 cm) Weight: 204 lb 12.9 oz (92.9 kg) IBW/kg (Calculated) : 79.9  Vital Signs: Temp: 97.8 F (36.6 C) (12/07 0520) Temp Source: Oral (12/07 0520) BP: 104/73 (12/07 0520) Pulse Rate: 97 (12/07 0520)  Labs: Recent Labs    01/03/19 1755 01/04/19 0500 01/04/19 0844  01/05/19 0343 01/05/19 1655 01/05/19 2300 01/06/19 0318  HGB  --  12.7*  --   --  12.0*  --   --  12.1*  HCT  --  37.6*  --   --  34.9*  --   --  35.0*  PLT  --  123*  --   --  107*  --   --  121*  APTT  --  38*  --    < > 158* 85* 199* 87*  LABPROT  --  16.2*  --   --   --   --   --   --   INR  --  1.3*  --   --   --   --   --   --   HEPARINUNFRC  --  1.54*  --   --  1.38*  --   --  0.81*  CREATININE  --  0.92  --   --  0.98  --   --  1.07  TROPONINIHS 11 20* 15  --   --   --   --   --    < > = values in this interval not displayed.    Estimated Creatinine Clearance: 57 mL/min (by C-G formula based on SCr of 1.07 mg/dL).  Assessment: 85 y.o. male admitted with abdominal pain, h/o Afib and Eliquis on hold now on heparin  Following aPTT - therapeutic   Cbc stable  Goal of Therapy:  Heparin level 0.3-0.7 units/ml  APTT 66-102 Monitor platelets by anticoagulation protocol: Yes   Plan:  Continue heparin 1000 units/hr Daily aptt hep lvl cbc  Barth Kirks, PharmD, BCPS, BCCCP Clinical Pharmacist [phone]  Please check AMION for all [place] numbers  01/06/2019 9:27 AM

## 2019-01-06 NOTE — Progress Notes (Addendum)
[facility] Gastroenterology Progress Note  [name] 85 y.o. [birth year]  CC: Abdominal pain, pancreatitis   Subjective: He is complaining of more substernal chest pain today.  Abdominal pain is generalized.  Denies any nausea or vomiting.  Had a bowel movement overnight.  ROS : Positive for substernal chest pain, negative for fever and shortness of breath.   Objective: Vital signs in last 24 hours: Vitals:   01/05/19 2110 01/06/19 0520  BP: 119/63 104/73  Pulse: (!) 103 97  Resp: 17 17  Temp: 98.3 F (36.8 C) 97.8 F (36.6 C)  SpO2: 96% 95%    Physical Exam:  General:  Alert, cooperative, no distress, appears stated age  Head:  Normocephalic, without obvious abnormality, atraumatic  Eyes:  , EOM's intact,   Lungs:   Clear to auscultation bilaterally, respirations unlabored  Heart:  Regular rate and rhythm, S1, S2 normal  Abdomen:   Soft, mild generalized discomfort on palpation, bowel sounds present, no peritoneal signs  Extremities: Extremities normal, atraumatic, no  edema       Lab Results: Recent Labs    01/05/19 0343 01/06/19 0318  NA 129* 132*  K 4.2 3.7  CL 97* 97*  CO2 24 19*  GLUCOSE 189* 169*  BUN 18 16  CREATININE 0.98 1.07  CALCIUM 8.0* 8.1*  MG 1.7 2.1   Recent Labs    01/05/19 0343 01/06/19 0318  AST 60* 43*  ALT 96* 73*  ALKPHOS 131* 144*  BILITOT 2.8* 3.1*  PROT 5.7* 6.3*  ALBUMIN 2.7* 2.7*   Recent Labs    01/03/19 1548 01/04/19 0500 01/05/19 0343 01/06/19 0318  WBC 9.0 11.8* 12.7* 12.4*  NEUTROABS 7.8* 10.3*  --   --   HGB 14.4 12.7* 12.0* 12.1*  HCT 43.6 37.6* 34.9* 35.0*  MCV 103.3* 102.5* 101.7* 100.6*  PLT 138* 123* 107* 121*   Recent Labs    01/04/19 0500  LABPROT 16.2*  INR 1.3*      Assessment/Plan: -Acute pancreatitis.  Most likely gallstone.  Continues to have abdominal discomfort. -Abnormal LFTs.  Stable. -Substernal chest pain. -Coronary artery disease, peripheral vascular disease, history of AICD  placement -Chronic anticoagulation with apixaban.  On heparin drip now  Recommendations ------------------------- -Repeat CT abdomen with IV and oral contrast today for follow-up on pancreatitis and to rule out any pancreatic necrosis -Advance diet to full liquid -Start IV Protonix 40 mg once a day -Repeat blood work in the morning -GI will follow   Otis Brace MD, Luis Lopez 01/06/2019, 9:17 AM  Contact #  [phone]

## 2019-01-06 NOTE — Progress Notes (Signed)
Reason for Consult: gallstone pancreatitis with incidental appendicitis Referring Physician: Starla Link, MD  [name] is an 85 y.o. male.   HPI: 8M with history of multiple cardiac issues, including CAD with prior CABG, MVR, polymorphic VT s/p AICD, PFO closure, persistent afib on Eliquis and [name], recent right CEA for amaurosis fugax, and dyslipidemia, and incidental 5.6cm AAA admitted for acute abdominal pain, elevated lipase and found to have pancreatitis with gallstones on CT imaging 12/4. Initial lipase was 2777 that day, but downtrended to 960 the following day. LFTs were mildly elevated and are also trending down, except Tbili, which initially improved, but is back up to 3.1. The patient is on zosyn day 3 of therapy and diet is being advanced. Repeat CT 12/7 performed to eval for pancreatic necrosis and was negative for pancreatic necrosis, but notable for incidentally more distended and air-filled appendix.   Notable consultants and plans are: - GI: unable to undergo MRCP due to AICD, possible ERCP planned - Vascular surgery: no intervention for AAA - Cardiology: increased, but not prohibitive, risk of surgical complications. Recs for ICD shock deactivation or magnet placement intra-op and transition from Eliquis to heparin gtt in the peri-operative period.    Past Medical History:  Diagnosis Date   Acute on chronic combined systolic (congestive) and diastolic (congestive) heart failure (HCC)    AICD (automatic cardioverter/defibrillator) present    [employer]n PPM/ICD   Carotid stenosis, right    s/p endarterectomy 11/07/18   Chronic combined systolic (congestive) and diastolic (congestive) heart failure (HCC)    Coronary artery disease    [facility]   Diabetes mellitus    Dysrhythmia    Endocarditis, valve unspecified    Mitral   Headache    hx migraines 6-7 x mo   History of migraine headaches    History of seasonal allergies     ICD (implantable cardiac defibrillator) in place    PAF (paroxysmal atrial fibrillation) ([place])    NO LAA occlusion at the time of surgery  [name] 2018   Pleural effusion    PVC's (premature ventricular contractions)    Ventricular tachycardia, polymorphic ([place])    status post ICD implantation    Past Surgical History:  Procedure Laterality Date   CARDIAC CATHETERIZATION  04/03/2007   EF 40%   CARDIAC CATHETERIZATION  02/06/2006   EF 45%. ANTERIOR HYPOKINESIS   CARDIAC CATHETERIZATION  05/29/2000   EF 50%. MILD ANTERIOR HYPOKINESIS   CARDIAC CATHETERIZATION  10/25/1999   EF 50%. SEVERE MITRAL REGURGITATION   CARDIAC CATHETERIZATION  01/06/2003   EF 50%   CARDIAC DEFIBRILLATOR PLACEMENT     CATARACT EXTRACTION W/ INTRAOCULAR LENS  IMPLANT, BILATERAL     CORONARY ARTERY BYPASS GRAFT  2001   2 VESSEL CABG AND MITRAL VALVE REPAIR. HE HAD A LIMA GRAFT TO THE LAD AND RADIAL ARTERY  GRAFT TO THE OBTUSE MARGINAL  OF LEFT CIRCUMFLEX   ENDARTERECTOMY Right 11/07/2018   Procedure: ENDARTERECTOMY CAROTID RIGHT;  Surgeon: Waynetta Sandy, MD;  Location: [facility];  Service: Vascular;  Laterality: Right;   EP IMPLANTABLE DEVICE N/A 11/06/2014   Procedure: ICD Generator Changeout;  Surgeon: Deboraha Sprang, MD;  Location: [facility];  Service: Cardiovascular;  Laterality: N/A;   FOREIGN BODY REMOVAL Right 06/21/2017   Procedure: FOREIGN BODY REMOVAL ADULT RIGHT RING FINGER;  Surgeon: Leanora Cover, MD;  Location: [facility];  Service: Orthopedics;  Laterality: Right;   HEMORRHOID SURGERY  HEMORROIDECTOMY     INTRAVASCULAR PRESSURE WIRE/FFR STUDY N/A 01/05/2017   Procedure: INTRAVASCULAR PRESSURE WIRE/FFR STUDY;  Surgeon: Sherren Mocha, MD;  Location: [facility];  Service: Cardiovascular;  Laterality: N/A;   KNEE ARTHROSCOPY     RIGHT KNEE   LEFT HEART CATHETERIZATION WITH CORONARY ANGIOGRAM N/A 07/04/2012   Procedure: LEFT HEART CATHETERIZATION WITH CORONARY  ANGIOGRAM;  Surgeon: Sherren Mocha, MD;  [facility];  Service: Cardiovascular;  Laterality: N/A;   MITRAL VALVE REPLACEMENT     No LAA occlusion at the time of surgery  Karie Soda report 2018   RIGHT/LEFT HEART CATH AND CORONARY/GRAFT ANGIOGRAPHY N/A 01/05/2017   Procedure: RIGHT/LEFT HEART CATH AND CORONARY/GRAFT ANGIOGRAPHY;  Surgeon: Sherren Mocha, MD;  Location: [facility];  Service: Cardiovascular;  Laterality: N/A;   TRANSTHORACIC ECHOCARDIOGRAM  04/02/2007   EF 35%    Family History  Problem Relation Age of Onset   Heart disease Father    Emphysema Father    Stroke Mother    Cancer Sister        breast cancer   Heart disease Paternal Grandmother    Heart disease Paternal Grandfather    Diabetes Other        grandmother, aunt, uncle   Cancer Other        lung cancer, aunt    Social History:  reports that he quit smoking about 47 years ago. His smoking use included cigarettes. He started smoking about 62 years ago. He has a 32.00 pack-year smoking history. He has never used smokeless tobacco. He reports current alcohol use. He reports that he does not use drugs.  Allergies:  Allergies  Allergen Reactions   Bee Venom Anaphylaxis   Latex Other (See Comments)    REDNESS AT REACTION SITE.   Metformin Diarrhea   Rivaroxaban Other (See Comments)    Headaches, blurred vision   Sotalol Other (See Comments)    "BLUE TOES"- cut his circulation off   Warfarin Sodium Other (See Comments)    REACTION: migraine headaches/vision impariment    Medications: I have reviewed the patient's current medications.  Results for orders placed or performed during the hospital encounter of 01/03/19 (from the past 48 hour(s))  APTT     Status: Abnormal   Collection Time: 01/04/19  6:14 PM  Result Value Ref Range   aPTT 98 (H) 24 - 36 seconds    Comment:        IF BASELINE aPTT IS ELEVATED, SUGGEST PATIENT RISK ASSESSMENT BE USED TO DETERMINE  APPROPRIATE ANTICOAGULANT THERAPY. Performed at [facility], [place] [address], [place], [place] [address]   Glucose, capillary     Status: Abnormal   Collection Time: 01/04/19  9:15 PM  Result Value Ref Range   Glucose-Capillary 183 (H) 70 - 99 mg/dL  Heparin level     Status: Abnormal   Collection Time: 01/05/19  3:43 AM  Result Value Ref Range   Heparin Unfractionated 1.38 (H) 0.30 - 0.70 IU/mL    Comment: RESULTS CONFIRMED BY MANUAL DILUTION Performed at [facility], [place] [address], [place], [place] [address]   CBC9     Status: Abnormal   Collection Time: 01/05/19  3:43 AM  Result Value Ref Range   WBC 12.7 (H) 4.0 - 10.5 K/uL   RBC 3.43 (L) 4.22 - 5.81 MIL/uL   Hemoglobin 12.0 (L) 13.0 - 17.0 g/dL   HCT 34.9 (L) 39.0 - 52.0 %  MCV 101.7 (H) 80.0 - 100.0 fL   MCH 35.0 (H) 26.0 - 34.0 pg   MCHC 34.4 30.0 - 36.0 g/dL   RDW 13.7 11.5 - 15.5 %   Platelets 107 (L) 150 - 400 K/uL    Comment: REPEATED TO VERIFY PLATELET COUNT CONFIRMED BY SMEAR Immature Platelet Fraction may be clinically indicated, consider ordering this additional test [id number]    nRBC 0.0 0.0 - 0.2 %    Comment: Performed at [facility] [address] [place], [place] [address]  APTT     Status: Abnormal   Collection Time: 01/05/19  3:43 AM  Result Value Ref Range   aPTT 158 (H) 24 - 36 seconds    Comment:        IF BASELINE aPTT IS ELEVATED, SUGGEST PATIENT RISK ASSESSMENT BE USED TO DETERMINE APPROPRIATE ANTICOAGULANT THERAPY. Performed at [facility], [place] [address] [place], [place] [address]   Comprehensive metabolic panel     Status: Abnormal   Collection Time: 01/05/19  3:43 AM  Result Value Ref Range   Sodium 129 (L) 135 - 145 mmol/L   Potassium 4.2 3.5 - 5.1 mmol/L   Chloride 97 (L) 98 - 111 mmol/L   CO2 24 22 - 32 mmol/L   Glucose, Bld 189 (H) 70 - 99 mg/dL   BUN 18 8 - 23 mg/dL   Creatinine, Ser 0.98 0.61 - 1.24 mg/dL   Calcium 8.0 (L) 8.9 - 10.3 mg/dL    Total Protein 5.7 (L) 6.5 - 8.1 g/dL   Albumin 2.7 (L) 3.5 - 5.0 g/dL   AST 60 (H) 15 - 41 U/L   ALT 96 (H) 0 - 44 U/L   Alkaline Phosphatase 131 (H) 38 - 126 U/L   Total Bilirubin 2.8 (H) 0.3 - 1.2 mg/dL   GFR calc non Af Amer >60 >60 mL/min   GFR calc Af Amer >60 >60 mL/min   Anion gap 8 5 - 15    Comment: Performed at [facility], [place] [address] [place], [place] [address]  Magnesium     Status: None   Collection Time: 01/05/19  3:43 AM  Result Value Ref Range   Magnesium 1.7 1.7 - 2.4 mg/dL    Comment: Performed at [facility] [address], [place], [place] [address]  Glucose, capillary     Status: Abnormal   Collection Time: 01/05/19  7:41 AM  Result Value Ref Range   Glucose-Capillary 157 (H) 70 - 99 mg/dL  Glucose, capillary     Status: Abnormal   Collection Time: 01/05/19 11:56 AM  Result Value Ref Range   Glucose-Capillary 120 (H) 70 - 99 mg/dL  APTT     Status: Abnormal   Collection Time: 01/05/19  4:55 PM  Result Value Ref Range   aPTT 85 (H) 24 - 36 seconds    Comment:        IF BASELINE aPTT IS ELEVATED, SUGGEST PATIENT RISK ASSESSMENT BE USED TO DETERMINE APPROPRIATE ANTICOAGULANT THERAPY. Performed at [facility], [place] [address] [place], [place] [address]   Glucose, capillary     Status: Abnormal   Collection Time: 01/05/19  5:16 PM  Result Value Ref Range   Glucose-Capillary 135 (H) 70 - 99 mg/dL  Glucose, capillary     Status: Abnormal   Collection Time: 01/05/19  9:05 PM  Result Value Ref Range   Glucose-Capillary 115 (H) 70 - 99 mg/dL  APTT  Status: Abnormal   Collection Time: 01/05/19 11:00 PM  Result Value Ref Range   aPTT 199 (HH) 24 - 36 seconds    Comment:        IF BASELINE aPTT IS ELEVATED, SUGGEST PATIENT RISK ASSESSMENT BE USED TO DETERMINE APPROPRIATE ANTICOAGULANT THERAPY. REPEATED TO VERIFY CRITICAL RESULT CALLED TO, READ BACK BY AND VERIFIED WITH: Felecia Shelling, RN AT [facility] ON 01/06/2019 BY Epifanio Lesches  S Performed at [facility], [place] [address] [place], [place] [address]   Heparin level     Status: Abnormal   Collection Time: 01/06/19  3:18 AM  Result Value Ref Range   Heparin Unfractionated 0.81 (H) 0.30 - 0.70 IU/mL    Comment: (NOTE) If heparin results are below expected values, and patient dosage has  been confirmed, suggest follow up testing of antithrombin III levels. Performed at [facility], [place] [address], [place], [place] [address]   CBC9     Status: Abnormal   Collection Time: 01/06/19  3:18 AM  Result Value Ref Range   WBC 12.4 (H) 4.0 - 10.5 K/uL   RBC 3.48 (L) 4.22 - 5.81 MIL/uL   Hemoglobin 12.1 (L) 13.0 - 17.0 g/dL   HCT 35.0 (L) 39.0 - 52.0 %   MCV 100.6 (H) 80.0 - 100.0 fL   MCH 34.8 (H) 26.0 - 34.0 pg   MCHC 34.6 30.0 - 36.0 g/dL   RDW 13.6 11.5 - 15.5 %   Platelets 121 (L) 150 - 400 K/uL   nRBC 0.0 0.0 - 0.2 %    Comment: Performed at [facility] [address], [place], [place] [address]  Comprehensive metabolic panel     Status: Abnormal   Collection Time: 01/06/19  3:18 AM  Result Value Ref Range   Sodium 132 (L) 135 - 145 mmol/L   Potassium 3.7 3.5 - 5.1 mmol/L   Chloride 97 (L) 98 - 111 mmol/L   CO2 19 (L) 22 - 32 mmol/L   Glucose, Bld 169 (H) 70 - 99 mg/dL   BUN 16 8 - 23 mg/dL   Creatinine, Ser 1.07 0.61 - 1.24 mg/dL   Calcium 8.1 (L) 8.9 - 10.3 mg/dL   Total Protein 6.3 (L) 6.5 - 8.1 g/dL   Albumin 2.7 (L) 3.5 - 5.0 g/dL   AST 43 (H) 15 - 41 U/L   ALT 73 (H) 0 - 44 U/L   Alkaline Phosphatase 144 (H) 38 - 126 U/L   Total Bilirubin 3.1 (H) 0.3 - 1.2 mg/dL   GFR calc non Af Amer >60 >60 mL/min   GFR calc Af Amer >60 >60 mL/min   Anion gap 16 (H) 5 - 15    Comment: Performed at [facility], [place] [address], [place], [place] [address]  Magnesium     Status: None   Collection Time: 01/06/19  3:18 AM  Result Value Ref Range   Magnesium 2.1 1.7 - 2.4 mg/dL    Comment: Performed at [facility]  [address] [place], [place] [address]  APTT     Status: Abnormal   Collection Time: 01/06/19  3:18 AM  Result Value Ref Range   aPTT 87 (H) 24 - 36 seconds    Comment:        IF BASELINE aPTT IS ELEVATED, SUGGEST PATIENT RISK ASSESSMENT BE USED TO DETERMINE APPROPRIATE ANTICOAGULANT THERAPY. Performed at [facility], [place] [address] [place], [place] [address]  Glucose, capillary     Status: Abnormal   Collection Time: 01/06/19  7:40 AM  Result Value Ref Range   Glucose-Capillary 157 (H) 70 - 99 mg/dL  Troponin I (High Sensitivity)     Status: Abnormal   Collection Time: 01/06/19 12:09 PM  Result Value Ref Range   Troponin I (High Sensitivity) 24 (H) <18 ng/L    Comment: (NOTE) Elevated high sensitivity troponin I (hsTnI) values and significant  changes across serial measurements may suggest ACS but many other  chronic and acute conditions are known to elevate hsTnI results.  Refer to the "Links" section for chest pain algorithms and additional  guidance. Performed at [facility], [place] [address], [place], [place] [address]   Glucose, capillary     Status: Abnormal   Collection Time: 01/06/19 12:29 PM  Result Value Ref Range   Glucose-Capillary 138 (H) 70 - 99 mg/dL  Glucose, capillary     Status: Abnormal   Collection Time: 01/06/19  5:12 PM  Result Value Ref Range   Glucose-Capillary 113 (H) 70 - 99 mg/dL    Ct Abdomen W Contrast  Result Date: 01/06/2019 CLINICAL DATA:  Pancreatitis. EXAM: CT ABDOMEN WITH CONTRAST TECHNIQUE: Multidetector CT imaging of the abdomen was performed using the standard protocol following bolus administration of intravenous contrast. CONTRAST:  154mL OMNIPAQUE IOHEXOL 300 MG/ML  SOLN COMPARISON:  CTA 01/03/2019 FINDINGS: Lower chest: The heart is enlarged. No substantial pericardial effusion. Small bilateral pleural effusions evident. Hepatobiliary: No suspicious focal abnormality within the liver parenchyma. Gallbladder is distended with  tiny layering gallstones evident, measuring up to 4 mm diameter. Mild intrahepatic biliary duct dilatation associated with extrahepatic common duct measuring 13 mm diameter. Common bile duct just proximal to the ampulla is 12 mm diameter. No calcified stone discernible within the extrahepatic biliary system. Pancreas: Pancreatic parenchyma is ill-defined with peripancreatic edema noted diffusely but most prominent in the region of the pancreatic head. No dilatation of the main pancreatic duct. Pancreatic parenchyma enhances throughout. Spleen: No splenomegaly. No focal mass lesion. Adrenals/Urinary Tract: No adrenal nodule or mass. 3.1 cm exophytic cyst noted interpolar left kidney. Right kidney unremarkable. No evidence for hydroureter. The urinary bladder appears normal for the degree of distention. Stomach/Bowel: Stomach is unremarkable. No gastric wall thickening. No evidence of outlet obstruction. Duodenum is normally positioned as is the ligament of Treitz. No small bowel wall thickening. No small bowel dilatation. The terminal ileum is normal. The appendix measures up to 13 mm diameter with a fluid-filled lumen. CT scan yesterday showed gas diffusely in the lumen of the appendix with appendiceal diameter maximal at 8 mm. There is fluid/edema around the gallbladder on today's study, progressive in the interval. No gross colonic mass. No colonic wall thickening. Diverticular changes are noted in the left colon without evidence of diverticulitis. Vascular/Lymphatic: Abdominal aortic aneurysm is stable in the interval measuring 5.7 cm diameter today. Insert calcis crash that There is no gastrohepatic or hepatoduodenal ligament lymphadenopathy. No intraperitoneal or retroperitoneal lymphadenopathy. No pelvic sidewall lymphadenopathy. Other: Small volume free fluid noted adjacent to the liver and spleen. There is fluid in the para colic gutters bilaterally. Retroperitoneal edema/inflammation is seen in the anterior  pararenal space and tracking in the extraperitoneal tissues towards the pelvis, right greater than left. Musculoskeletal: Small bilateral groin hernias contain only fat. Posttraumatic deformity noted right iliac bone. Compression deformity in the in the L4 vertebral body is stable. IMPRESSION: 1. Peripancreatic edema/inflammation, similar to prior. Acute pancreatitis could  have this appearance. There is no dilatation of the main pancreatic duct. No findings to suggest pancreatic necrosis. 2. No focal or rim enhancing fluid collection in the abdomen/pelvis. 3. Distended gallbladder with calcified gallstones. 4. Intra and extrahepatic biliary duct dilatation with extrahepatic common duct measuring up to 13 mm. No calcified stone visualized within the extrahepatic bile ducts on today's study. MRCP or ERCP may prove helpful to further evaluate as clinically warranted. 5. Since the prior study, the appendix is become more distended and fluid-filled. Appendiceal diameter is abnormal today at 13 mm. Appendiceal lumen is diffusely air-filled on the study from 3 days ago. Features on today's exam may be secondary to irritation from the intraperitoneal fluid and adjacent extraperitoneal inflammatory change. Acute appendicitis cannot be definitively excluded by imaging on this study. 6. Small volume intraperitoneal free fluid. 7. Small bilateral groin hernias contain only fat. 8. Abdominal aortic aneurysm. Vascular surgery consultation recommended due to increased risk of rupture for AAA >5.5 cm. This recommendation follows ACR consensus guidelines: White Paper of the ACR Incidental Findings Committee II on Vascular Findings. J Am Coll Radiol 2013; 10:789-794. Aortic aneurysm NOS (ICD10-I71.9) Electronically Signed   By: Misty Stanley M.D.   On: 01/06/2019 13:42    ROS 10 point review of systems is negative except as listed above in HPI.   Physical Exam Blood pressure 104/73, pulse 97, temperature 97.8 F (36.6 C),  temperature source Oral, resp. rate 17, height 6\' 1"  (1.854 m), weight 92.9 kg, SpO2 95 %. Physical Exam Gen: comfortable, no distress Neuro: non-focal exam HEENT: PERRL Neck: supple CV: RRR Pulm: unlabored breathing Abd: soft, obese, NT Extr: wwp, no edema    Assessment/Plan: 31M with h/o multiple cardiac issues and pancreatitis, likely from gallstones, likely choledocholithiasis, and incidental appendicitis seen on CT imaging to f/u pancreatitis.   Patient was educated regarding his current clinical problems and TC scan findings. At this time, given his ongoing management of presumed gallstone pancreatitis, would recommend continuation of broad spectrum abx with zosyn, which will cover both appendicitis and organisms from biliary origin. Recommend trending LFTs and lipase as well as performing ERCP in the setting of enlarged CBD, elevated Tbili with transaminitis, and gallstones. Post-ERCP, I defer the decision regarding surgical management for more detailed discussion with the patient and the day team, although my evaluation of the patient indicates that he is of reasonable fitness to undergo surgery, although as previously stated in the risk stratification by cardiology that he is at increased risk for peri-op complications, of which the patient verbalized understanding. His physiologic age is lower than his chronological age, which does not eliminate the risk, buts does mitigate it to some degree and acts in his favor. He also has excellent social support, living in a basement apartment with his wife at his daughter's home. Discussed with the patient the possibility of cholecystostomy tube placement as an alternative to cholecystectomy. Regarding the radiographic finding of appendicitis, the patient currently has no pain to suggest acute appendicitis and describes presenting pain centrally and in the epigastrium. He is unlikely to have two concomitant infectious processes, however this  possibility is non-zero. If surgical intervention is planned, would recommend visual inspection of appendix at the time of cholecystectomy with a plan for concomitant appendectomy if any abnormality of the appendix is identified. If cholecystostomy tube placement is planned or if appendectomy is not performed at the time of surgery, would recommend discussion with patient regarding undergoing colonoscopy at 6 weeks to eval for  malignancy. Given his age, the management of any colonoscopic-identified malignancy identified may not warrant performance of the colonoscopy, so an open and frank discussion should be held with the patient regarding this before undergoing.   Jesusita Oka, MD General and [facility]

## 2019-01-06 NOTE — Consult Note (Signed)
Cardiology Consultation:   Patient ID: [name] MRN: [id number]; DOB: [birth year]  Admit date: 01/03/2019 Date of Consult: 01/06/2019  Primary Care Provider: Joycelyn Rua, MD Primary Cardiologist: [name], MD  Primary Electrophysiologist:  Sherryl Manges, MD   Patient Profile:   [name] is a 85 y.o. male with a hx of CAD s/p CABG, mitral valve repair, polymorphic VT s/p ICD Medtronic dual chamber, PFO closure, Persistent afib on Eliquis, Chronic systolic and diastolic HF (EF 02% 2020), HTN, DM, SIADH, carotid artery disease s/p R CEA 10/2018 and HLD who is being seen today for the evaluation of pre-operative cardiac evaluation for acute pancreatitis at the request of Dr. Hanley Ben.  History of Present Illness:   [name] has a history of remote CABG, PFO closure and mitral valve repair at the [facility]. His last cath was 12/2016 showing, severe 2V CAD with continued patency of the LIMA-LAD and right radial graft-OM, severely calcified, proximal circumflex stenosis with negative FFR. Medical therapy was continued. Patient has had Afib for many years with multiple failed cardioversions. In 2019 the patient had symptomatic Afib and was seen in the Afib clinic. He had 2 failed shocks at home. He was set up for TEE DCCV but converted spontaneously. He was placed back on [name].  He was admitted 03/2018 for syncope. ICD interrogation showed rate controlled afib, 88% pacing, no sustained ventricular arrhythmias.   He saw Dr. Graciela Husbands 10/23/18 for Afib and ICD follow up. ICD showed increasing afib burden and amiodarone was started again. The patient developed a rash and amiodarone was stopped. He was started back on [name] and has felt he has been maintaining NSR since then.   The patient presented to the ED 01/03/19 for severe abdominal pain in the lower abdomen. Labs showed elevated LFTS, AST 224, ALT 250, total bili 3.1, Lipase 27. Creatinine 1.7, Hgb 14.4, platelets 138.  BNP 295. HS troponin was negative. CT abdomen was concerning for acute pancreatitis with dilated CBD and gallstones with ductal enhancement concerning for infection. CT also showed 5.7 cm aortic aneurysm with no dissection. EKG showed NSR with old RBBB. The patient was started on IVF and abx and admitted. GI was consulted.   Heart Pathway Score:     Past Medical History:  Diagnosis Date   Acute on chronic combined systolic (congestive) and diastolic (congestive) heart failure (HCC)    AICD (automatic cardioverter/defibrillator) present    Medtronicn PPM/ICD   Carotid stenosis, right    s/p endarterectomy 11/07/18   Chronic combined systolic (congestive) and diastolic (congestive) heart failure (HCC)    Coronary artery disease    [facility]   Diabetes mellitus    Dysrhythmia    Endocarditis, valve unspecified    Mitral   Headache    hx migraines 6-7 x mo   History of migraine headaches    History of seasonal allergies    ICD (implantable cardiac defibrillator) in place    PAF (paroxysmal atrial fibrillation) (HCC)    NO LAA occlusion at the time of surgery  [name] 2018   Pleural effusion    PVC's (premature ventricular contractions)    Ventricular tachycardia, polymorphic (HCC)    status post ICD implantation    Past Surgical History:  Procedure Laterality Date   CARDIAC CATHETERIZATION  04/03/2007   EF 40%   CARDIAC CATHETERIZATION  02/06/2006   EF 45%. ANTERIOR HYPOKINESIS   CARDIAC CATHETERIZATION  05/29/2000   EF 50%. MILD ANTERIOR  HYPOKINESIS   CARDIAC CATHETERIZATION  10/25/1999   EF 50%. SEVERE MITRAL REGURGITATION   CARDIAC CATHETERIZATION  01/06/2003   EF 50%   CARDIAC DEFIBRILLATOR PLACEMENT     CATARACT EXTRACTION W/ INTRAOCULAR LENS  IMPLANT, BILATERAL     CORONARY ARTERY BYPASS GRAFT  2001   2 VESSEL CABG AND MITRAL VALVE REPAIR. HE HAD A LIMA GRAFT TO THE LAD AND RADIAL ARTERY  GRAFT TO THE OBTUSE MARGINAL  OF LEFT  CIRCUMFLEX   ENDARTERECTOMY Right 11/07/2018   Procedure: ENDARTERECTOMY CAROTID RIGHT;  Surgeon: Maeola Harman, MD;  Location: [facility] OR;  Service: Vascular;  Laterality: Right;   EP IMPLANTABLE DEVICE N/A 11/06/2014   Procedure: ICD Generator Changeout;  Surgeon: Duke Salvia, MD;  Location: [facility] INVASIVE CV LAB;  Service: Cardiovascular;  Laterality: N/A;   FOREIGN BODY REMOVAL Right 06/21/2017   Procedure: FOREIGN BODY REMOVAL ADULT RIGHT RING FINGER;  Surgeon: Betha Loa, MD;  Location: [facility] OR;  Service: Orthopedics;  Laterality: Right;   HEMORRHOID SURGERY     HEMORROIDECTOMY     INTRAVASCULAR PRESSURE WIRE/FFR STUDY N/A 01/05/2017   Procedure: INTRAVASCULAR PRESSURE WIRE/FFR STUDY;  Surgeon: [name], MD;  Location: [facility] INVASIVE CV LAB;  Service: Cardiovascular;  Laterality: N/A;   KNEE ARTHROSCOPY     RIGHT KNEE   LEFT HEART CATHETERIZATION WITH CORONARY ANGIOGRAM N/A 07/04/2012   Procedure: LEFT HEART CATHETERIZATION WITH CORONARY ANGIOGRAM;  Surgeon: [name], MD;  Location: [facility] CATH LAB;  Service: Cardiovascular;  Laterality: N/A;   MITRAL VALVE REPLACEMENT     No LAA occlusion at the time of surgery  Tamela Oddi report 2018   RIGHT/LEFT HEART CATH AND CORONARY/GRAFT ANGIOGRAPHY N/A 01/05/2017   Procedure: RIGHT/LEFT HEART CATH AND CORONARY/GRAFT ANGIOGRAPHY;  Surgeon: [name], MD;  Location: [facility] INVASIVE CV LAB;  Service: Cardiovascular;  Laterality: N/A;   TRANSTHORACIC ECHOCARDIOGRAM  04/02/2007   EF 35%     Home Medications:  Prior to Admission medications   Medication Sig Start Date End Date Taking? Authorizing Provider  apixaban (ELIQUIS) 5 MG TABS tablet Take 1 tablet (5 mg total) by mouth 2 (two) times daily. 10/23/18  Yes Duke Salvia, MD  atorvastatin (LIPITOR) 80 MG tablet Take 80 mg by mouth at bedtime.  07/12/10  Yes [provider]  Cholecalciferol (VITAMIN D-3) 5000 UNITS TABS Take 5,000-10,000 Units by mouth See admin  instructions. Take 5000 units by mouth on Sunday, Tuesday, Thursday, and Saturday. Take 16109 units by mouth on Monday, Wednesday, and Friday   Yes [provider]  dofetilide ([name]) 500 MCG capsule Take 500 mcg by mouth 2 (two) times daily.    Yes [provider]  EPIPEN 2-PAK 0.3 MG/0.3ML SOAJ injection Inject 0.3 mg into the muscle daily as needed for anaphylaxis. USE ONLY IN EMERGENCY 08/12/12  Yes [provider]  fluticasone (FLONASE) 50 MCG/ACT nasal spray Place 1 spray into the nose daily as needed for rhinitis or allergies.   Yes [provider]  furosemide (LASIX) 20 MG tablet Take 1 tablet (20 mg total) by mouth daily as needed for fluid or edema. 03/07/18  Yes Zannie Cove, MD  ibuprofen (ADVIL) 200 MG tablet Take 400 mg by mouth every 6 (six) hours as needed for headache.   Yes [provider]  Magnesium 250 MG TABS Take 250 mg by mouth daily.    Yes [provider]  metoprolol succinate (TOPROL-XL) 50 MG 24 hr tablet Take 1 tablet (50 mg total)  by mouth 2 (two) times daily. Take with or immediately following a meal. Patient taking differently: Take 50 mg by mouth daily. Take with or immediately following a meal. 10/23/18  Yes Duke Salvia, MD  Multiple Vitamin (MULTIVITAMIN WITH MINERALS) TABS Take 1 tablet by mouth daily. Centrum Silver   Yes [provider]  nateglinide (STARLIX) 120 MG tablet Take 120 mg by mouth Twice daily.  07/12/10  Yes [provider]  pioglitazone (ACTOS) 45 MG tablet Take 45 mg by mouth daily.     Yes [provider]  sitaGLIPtin (JANUVIA) 100 MG tablet Take 100 mg by mouth at bedtime.    Yes [provider]  Tiotropium Bromide-Olodaterol (STIOLTO RESPIMAT) 2.5-2.5 MCG/ACT AERS Inhale 2 puffs into the lungs daily. 04/09/18  Yes Coral Ceo, NP  triamcinolone cream (KENALOG) 0.1 % Apply 1 application topically 2 (two) times daily as needed (rash).  12/09/18  Yes  [provider]  amiodarone (PACERONE) 200 MG tablet Take 1 tablet (200 mg total) TWICE daily for one month, then reduce and take 1 tablet (200 mg daily) ONCE daily Patient not taking: Reported on 01/04/2019 10/26/18   Duke Salvia, MD    Inpatient Medications: Scheduled Meds:  dofetilide  500 mcg Oral BID   insulin aspart  0-5 Units Subcutaneous QHS   insulin aspart  0-9 Units Subcutaneous TID WC   metoprolol succinate  50 mg Oral Daily   pantoprazole (PROTONIX) IV  40 mg Intravenous Q24H   sodium chloride flush  10-40 mL Intracatheter Q12H   Continuous Infusions:  sodium chloride 75 mL/hr at 01/06/19 0934   heparin 1,000 Units/hr (01/06/19 0522)   piperacillin-tazobactam (ZOSYN)  IV 3.375 g (01/06/19 0520)   PRN Meds: acetaminophen **OR** acetaminophen, alum & mag hydroxide-simeth, ibuprofen, metoCLOPramide (REGLAN) injection, morphine injection, ondansetron **OR** ondansetron (ZOFRAN) IV, sodium chloride flush  Allergies:    Allergies  Allergen Reactions   Bee Venom Anaphylaxis   Latex Other (See Comments)    REDNESS AT REACTION SITE.   Metformin Diarrhea   Rivaroxaban Other (See Comments)    Headaches, blurred vision   Sotalol Other (See Comments)    "BLUE TOES"- cut his circulation off   Warfarin Sodium Other (See Comments)    REACTION: migraine headaches/vision impariment    Social History:   Social History   Socioeconomic History   Marital status: Married    Spouse name: Elease Hashimoto   Number of children: 2   Years of education: Not on file   Highest education level: Associate degree: academic program  Occupational History   Occupation: retired  Ecologist strain: Not on file   Food insecurity    Worry: Not on file    Inability: Not on Occupational hygienist needs    Medical: Not on file    Non-medical: Not on file  Tobacco Use   Smoking status: Former Smoker    Packs/day: 2.00    Years: 16.00     Pack years: 32.00    Types: Cigarettes    Start date: 02/23/1956    Quit date: 01/31/1971    Years since quitting: 47.9   Smokeless tobacco: Never Used  Substance and Sexual Activity   Alcohol use: Yes    Alcohol/week: 0.0 standard drinks    Comment: 1-2 a week   Drug use: No   Sexual activity: Yes  Lifestyle   Physical activity    Days per week: Not on file  Minutes per session: Not on file   Stress: Not on file  Relationships   Social connections    Talks on phone: Not on file    Gets together: Not on file    Attends religious service: Not on file    Active member of club or organization: Not on file    Attends meetings of clubs or organizations: Not on file    Relationship status: Not on file   Intimate partner violence    Fear of current or ex partner: Not on file    Emotionally abused: Not on file    Physically abused: Not on file    Forced sexual activity: Not on file  Other Topics Concern   Not on file  Social History Narrative   Patient is right-handed. He lives with his wife ina 2 story home. He drinks one cup of coffee and a diet coke a day.     Family History:   Family History  Problem Relation Age of Onset   Heart disease Father    Emphysema Father    Stroke Mother    Cancer Sister        breast cancer   Heart disease Paternal Grandmother    Heart disease Paternal Grandfather    Diabetes Other        grandmother, aunt, uncle   Cancer Other        lung cancer, aunt     ROS:  Please see the history of present illness.  All other ROS reviewed and negative.     Physical Exam/Data:   Vitals:   01/05/19 1319 01/05/19 2110 01/06/19 0500 01/06/19 0520  BP: 132/85 119/63  104/73  Pulse: 97 (!) 103  97  Resp: 18 17  17   Temp: 99 F (37.2 C) 98.3 F (36.8 C)  97.8 F (36.6 C)  TempSrc: Oral Oral  Oral  SpO2: 94% 96%  95%  Weight:   92.9 kg   Height:        Intake/Output Summary (Last 24 hours) at 01/06/2019 1308 Last data filed  at 01/06/2019 0521 Gross per 24 hour  Intake 1962.27 ml  Output 700 ml  Net 1262.27 ml   Last 3 Weights 01/06/2019 01/05/2019 01/04/2019  Weight (lbs) 204 lb 12.9 oz 204 lb 12.9 oz 204 lb 12.9 oz  Weight (kg) 92.9 kg 92.9 kg 92.9 kg     Body mass index is 27.02 kg/m.  General:  Well nourished, well developed, in no acute distress HEENT: normal Lymph: no adenopathy Neck: no JVD Endocrine:  No thryomegaly Vascular: No carotid bruits; FA pulses 2+ bilaterally without bruits  Cardiac:  normal S1, S2; RRR; no murmur  Lungs:  clear to auscultation bilaterally, no wheezing, rhonchi or rales  Abd: soft, nontender, no hepatomegaly  Ext: no edema Musculoskeletal:  No deformities, BUE and BLE strength normal and equal Skin: warm and dry  Neuro:  CNs 2-12 intact, no focal abnormalities noted Psych:  Normal affect   EKG:  The EKG was personally reviewed and demonstrates:  Pending Telemetry:  Telemetry was personally reviewed and demonstrates:  N/A  Relevant CV Studies:  Echo 11/05/18  1. Left ventricular ejection fraction, by visual estimation, is 35 to 40%. The left ventricle has moderately decreased function. There is global left ventricular hypokinesis and marked apical-basal and septal-lateral dyssynchrony. There appears to be  apical akinesis.  2. Abnormal septal motion consistent with RV pacemaker.  3. Global right ventricle has moderately reduced systolic function.The right  ventricular size is normal. No increase in right ventricular wall thickness.  4. Left atrial size was severely dilated.  5. Right atrial size was moderately dilated.  6. Mild mitral valve regurgitation. Mild-moderate mitral stenosis. Peak and mean gradients are 16 and 5 mm Hg, respectively, at 70 bpm. Estimated valve area is 1.4 cm sq by the PHT method.  7. Moderate calcification of the mitral valve leaflet(s).  8. Severe mitral annular calcification.  9. Severe thickening of the mitral valve leaflet(s). 10. The  tricuspid valve is normal in structure. Tricuspid valve regurgitation is mild-moderate. 11. The aortic valve is normal in structure. Aortic valve regurgitation was not visualized by color flow Doppler. 12. The pulmonic valve was normal in structure. Pulmonic valve regurgitation is mild by color flow Doppler. 13. Mildly elevated pulmonary artery systolic pressure. 14. A pacer wire is visualized. 15. The inferior vena cava is dilated in size with >50% respiratory variability, suggesting right atrial pressure of 8 mmHg. 16. Left ventricular diastolic function could not be evaluated due to atrial fibrillation and mitral stenosis. 17. Compared to February 2020, mitral valve gradients are worse, probably due to new atrial fibrillation and increased ventricular rate.  Cardiac cath 11/05/2016 1. Severe 2 vessel CAD with continued patency of the LIMA-LAD and right radial graft-OM 2. Moderate, severely calcified, proximal circumflex stenosis with negative FFR assessment 3. Normal right heart hemodynamics except for a prominent V wave in the PCWP tracing 4. Normal/low LVEDP  Recommend: ongoing medical therapy. Pt appears to be well-compensated from a hemodynamic perspective.   Laboratory Data:  High Sensitivity Troponin:   Recent Labs  Lab 01/03/19 1548 01/03/19 1755 01/04/19 0500 01/04/19 0844  TROPONINIHS 10 11 20* 15     Chemistry Recent Labs  Lab 01/04/19 0500 01/05/19 0343 01/06/19 0318  NA 131* 129* 132*  K 4.6 4.2 3.7  CL 99 97* 97*  CO2 24 24 19*  GLUCOSE 133* 189* 169*  BUN 13 18 16   CREATININE 0.92 0.98 1.07  CALCIUM 8.4* 8.0* 8.1*  GFRNONAA >60 >60 >60  GFRAA >60 >60 >60  ANIONGAP 8 8 16*    Recent Labs  Lab 01/04/19 0500 01/05/19 0343 01/06/19 0318  PROT 6.2* 5.7* 6.3*  ALBUMIN 3.3* 2.7* 2.7*  AST 118* 60* 43*  ALT 157* 96* 73*  ALKPHOS 190* 131* 144*  BILITOT 2.5* 2.8* 3.1*   Hematology Recent Labs  Lab 01/04/19 0500 01/05/19 0343 01/06/19 0318  WBC  11.8* 12.7* 12.4*  RBC 3.67* 3.43* 3.48*  HGB 12.7* 12.0* 12.1*  HCT 37.6* 34.9* 35.0*  MCV 102.5* 101.7* 100.6*  MCH 34.6* 35.0* 34.8*  MCHC 33.8 34.4 34.6  RDW 14.1 13.7 13.6  PLT 123* 107* 121*   BNP Recent Labs  Lab 01/03/19 1548  BNP 295.0*    DDimer No results for input(s): DDIMER in the last 168 hours.   Radiology/Studies:  Ct Angio Chest/abd/pel For Dissection W And/or Wo Contrast  Result Date: 01/03/2019 CLINICAL DATA:  Severe chest pain EXAM: CT ANGIOGRAPHY CHEST, ABDOMEN AND PELVIS TECHNIQUE: Multidetector CT imaging through the chest, abdomen and pelvis was performed using the standard protocol during bolus administration of intravenous contrast. Multiplanar reconstructed images and MIPs were obtained and reviewed to evaluate the vascular anatomy. CONTRAST:  OMNIPAQUE IOHEXOL 350 MG/ML SOLN COMPARISON:  12/31/2002, ultrasound 01/03/2008 FINDINGS: CTA CHEST FINDINGS Cardiovascular: Non contrasted images of the chest demonstrate no acute intramural hematoma. Aorta is nonaneurysmal. Moderate aortic atherosclerosis. Left-sided pacing device with partially visualized intracardiac leads.  Extensive coronary vascular calcification. No significant pericardial effusion. Mediastinum/Nodes: Midline trachea. No thyroid mass. No significant adenopathy. Esophagus within normal limits. Lungs/Pleura: No acute consolidation or effusion. No pneumothorax. Bandlike scar atelectasis in the left upper lobe Musculoskeletal: Scoliosis and degenerative changes of the spine. Post sternotomy changes. No acute or suspicious osseous abnormality. Review of the MIP images confirms the above findings. CTA ABDOMEN AND PELVIS FINDINGS VASCULAR Aorta: Moderate aortic atherosclerosis. Fusiform infrarenal abdominal aortic aneurysm measuring 5.7 cm maximum on axial images over a 8.2 cm craniocaudad length. Incompletely opacified aneurysm sac with probable mural thrombus along the anterior inferior sac. No definitive  dissection. No retroperitoneal hematoma. Celiac: Mild calcification at the origin without high-grade stenosis. Negative for occlusion or aneurysm. SMA: Calcification at the origin without significant stenosis Renals: Single right and single left renal arteries without significant stenosis. IMA: Not well visualized.  Inadequately opacified. Inflow: Moderate aortic atherosclerosis without occlusion or high-grade stenosis. Veins: No obvious venous abnormality within the limitations of this arterial phase study. Review of the MIP images confirms the above findings. NON-VASCULAR Hepatobiliary: No focal hepatic abnormality. Multiple calcified gallstones. Mild enlargement of the extrahepatic common bile duct, measuring up to 13 mm. No calcified stones along the course of the duct. Mild ductal enhancement. Pancreas: Peripancreatic fluid and edema consistent with acute pancreatitis. No organized fluid collections. Spleen: Normal in size without focal abnormality. Adrenals/Urinary Tract: Adrenal glands are normal. No hydronephrosis. Cyst in the mid left kidney. The bladder is normal Stomach/Bowel: Stomach is within normal limits. Appendix appears normal. No evidence of bowel wall thickening, distention, or inflammatory changes. Sigmoid colon diverticular disease without acute inflammatory change Lymphatic: No significant adenopathy Reproductive: Prostate is unremarkable. Other: Fat containing inguinal hernias. No free air. Small amount of free fluid within the abdomen and pelvis. Musculoskeletal: Chronic appearing superior endplate deformity at L4. Diffuse degenerative changes. Review of the MIP images confirms the above findings. IMPRESSION: 1. Negative for acute aortic dissection. 5.7 cm fusiform aneurysm within the infrarenal abdominal aorta without evidence for retroperitoneal hematoma. Vascular surgery consultation recommended due to increased risk of rupture for AAA >5.5 cm. This recommendation follows ACR consensus  guidelines: White Paper of the ACR Incidental Findings Committee II on Vascular Findings. J Am Coll Radiol 2013; 10:789-794. Aortic aneurysm NOS (ICD10-I71.9) 2. Peripancreatic fluid and edema, suspicious for an acute pancreatitis. Recommend correlation with enzymes. Small free fluid within the abdomen and pelvis. 3. Gallstones. Dilated common bile duct up to 13 mm which should be correlated with LFT. There may be mild ductal enhancement and biliary infection cannot be excluded. 4. Sigmoid colon diverticular disease without acute inflammatory change Electronically Signed   By: Jasmine Pang M.D.   On: 01/03/2019 19:33    Assessment and Plan:   Pre-operative evaluation for acute biliary pancreatitis - Patient presented with severe abdominal pain. LFTs and WBC were elevated. CT abdomen showed possible acue pancreatitis with dilated CBD and gallstones concerning for infection. Placed on IVF and abx. GI following. Patient cannot have MRCP 2/2 AICD. Plan for surgery if pancreatitis is not improved with medical management.  - Yesterday patient reported atypical chest pain in the epigastric area that was worse with swallowing. He felt as if liquid/food was getting stuck. He says this is very different from cardiac pain he has had in the past.  - HS troponin trend is flat and not consistent with ACS. EKG pending. Last cath was 2018 and showed severe 2V disease with patent grafts, severely calcified proximal Cx stenosis with  negative FFR.  - Last echo 11/05/18 showed EF 34-40%, global LV hypokinesis, apical akinesis, biateral enlargement, severe mitral calcification, severe thickening of the MV leaflets. - Functional status is moderate. Patient is able to perform ADLs, climb a flight of stairs, walk 1-2 blocks, and do moderate work around the house. He reports no symptoms while doing those things.  - According to the [facility] Activity Status Index METS of 5.81 - According to the Revised Cardiac Risk Index the patient  is a Class III Risk, 10.1% 30 day risk of death, MI, or cardiac arrest - It is likely OK to proceed with possible surgery given acute pancreatitis. Monitor fluid status and QTc with daily EKGs. Monitor for recurrence of Afib.   Atypical Chest Pain - HS troponin 11>20>15>24 Trend not consistent with ACS - EKG pending - Last cath in 2018 showed patent grafts and residual Cx disease  Infrarenal abdominal Aortic aneurysm 5.7 mm with no dissection - Vascular plan for surgery after acute pancreatitis is resolved  Paroxysmal Afib - rate controlled - continue Toksyn and Toprol 50 mg - Eliquis held and placed on heparin drip>>restart Eliquis per GI  - Monitor QTc with daily EKG  Chronic systolic HF - EF in October 2020 showed 35-40% - Appears euvolemic on exam  For questions or updates, please contact [facility] Please consult [email or web address] for contact info under     Signed, Winda Summerall David Stall, PA-C  01/06/2019 1:08 PM

## 2019-01-06 NOTE — Progress Notes (Signed)
CRITICAL VALUE ALERT  Critical Value:  APTT 199  Date & Time Notied:  01/06/2019 @ 0045  Provider Notified: Kennon Holter, NP @ [phone]  Orders Received/Actions taken: Called by pharmacist with orders to redraw lab from right arm

## 2019-01-06 NOTE — Progress Notes (Signed)
Patient ID: [name], male   DOB: [birth year], 85 y.o.   MRN: [id number]  PROGRESS NOTE    [name]  [id number] DOB: 1931-10-24 DOA: 01/03/2019 PCP: [name], MD   Brief Narrative:  85 year old male with history of CAD status post CABG, chronic systolic heart failure status post AICD placement, A. fib on apixaban and [name] and recent reaction to amiodarone, recent carotid endarterectomy, recent admission for CHF in October 2020 presented on 01/03/2019 with abdominal pain.  He was found to have elevated LFTs and bilirubin along with elevated lipase.  CT of the abdomen and pelvis showed features concerning for acute pancreatitis with dilated CBD and gallstones with ductal enhancement concerning for infection.  He was started on IV fluids and antibiotics.  GI was consulted.  Vascular surgery was consulted for infrarenal abdominal aortic aneurysm.  Assessment & Plan:   Acute biliary pancreatitis Cholelithiasis with dilated CBD -CT of the abdomen pelvis showed features concerning for acute pancreatitis with dilated CBD and gallstones with ductal enhancement concerning for infection -Currently being managed with IV fluids and analgesics.   -Continue normal saline at 75 cc an hour. -Patient cannot have an MRCP because of AICD. -GI following.  Will follow up with further recommendations from GI regarding timing of ERCP versus cholecystectomy by general surgery.  Will trend LFTs.  AST/ALT improving but bilirubin slightly increased to 3.1 today. -Currently on empiric broad-spectrum antibiotics.   -States that he is not feeling that well and complains of lower chest pain as well along with intermittent nausea.  Will check troponins as well.  Leukocytosis -Probably from.  Monitor  Infrarenal abdominal aortic aneurysm measuring 5.7 cm with no dissection -Patient will need elective vascular surgery intervention once he has resolved from gallstone pancreatitis.  Vascular surgery  consultation appreciated.  Paroxysmal A. fib -Currently rate controlled.  Eliquis on hold.  Continue heparin drip for now. -Continue [name] and metoprolol.  Hyponatremia in a patient with history of recent diagnosis of SIADH -Improving.  Monitor sodium level closely.  IV fluid as above.  Chronic systolic heart failure -Last EF of 35 to 40% in October 2020. -Currently no signs of fluid overload.  Strict input output and daily weights. -We will request cardiology evaluation for management of heart failure as well as need for preop cardiac clearance as indicated by GI team earlier.  Diabetes mellitus type 2 -Continue CBGs with SSI  Thrombocytopenia -Questionable cause.  Improving.  Monitor  Macrocytic anemia -Questionable cause.  Monitor.  Will check vitamin B12, folate and TSH.  Generalized deconditioning -We will request PT evaluation  DVT prophylaxis: Heparin Code Status: Full Family Communication: Discussed with patient  disposition Plan: Depends on clinical outcome  Consultants: Gastroenterology.  Vascular surgery.  Consulted cardiology as well. Procedures: None Antimicrobials: Zosyn from 01/03/2019 onwards   Subjective: Patient seen and examined at bedside.  Does not feel well.  Had intermittent upper abdomen/lower chest pain overnight with nausea and diaphoresis.  Would prefer something else for pain other than fentanyl. Objective: Vitals:   01/05/19 1319 01/05/19 2110 01/06/19 0500 01/06/19 0520  BP: 132/85 119/63  104/73  Pulse: 97 (!) 103  97  Resp: 18 17  17   Temp: 99 F (37.2 C) 98.3 F (36.8 C)  97.8 F (36.6 C)  TempSrc: Oral Oral  Oral  SpO2: 94% 96%  95%  Weight:   92.9 kg   Height:        Intake/Output Summary (Last 24 hours) at 01/06/2019 0757  Last data filed at 01/06/2019 0521 Gross per 24 hour  Intake 1962.27 ml  Output 700 ml  Net 1262.27 ml   Filed Weights   01/04/19 0242 01/05/19 0500 01/06/19 0500  Weight: 92.9 kg 92.9 kg 92.9 kg     Examination:  General exam: No acute distress.  Looks chronically ill. Respiratory system: Bilateral decreased breath sounds at bases with scattered crackles.  No wheezing  cardiovascular system: Rate controlled, S1-S2 heard Gastrointestinal system: Abdomen is nondistended, soft and mildly tender in the lower quadrant as well as epigastric regions. Normal bowel sounds heard. Extremities: No cyanosis; trace edema Central nervous system: Awake, alert and oriented. No focal neurological deficits. Moving extremities Skin: No ulcers or rashes. Psychiatry: Looks slightly anxious.    Data Reviewed: I have personally reviewed following labs and imaging studies  CBC: Recent Labs  Lab 01/03/19 1548 01/04/19 0500 01/05/19 0343 01/06/19 0318  WBC 9.0 11.8* 12.7* 12.4*  NEUTROABS 7.8* 10.3*  --   --   HGB 14.4 12.7* 12.0* 12.1*  HCT 43.6 37.6* 34.9* 35.0*  MCV 103.3* 102.5* 101.7* 100.6*  PLT 138* 123* 107* 123XX123*   Basic Metabolic Panel: Recent Labs  Lab 01/03/19 1548 01/04/19 0500 01/05/19 0343 01/06/19 0318  NA 128* 131* 129* 132*  K 4.0 4.6 4.2 3.7  CL 95* 99 97* 97*  CO2 23 24 24  19*  GLUCOSE 171* 133* 189* 169*  BUN 17 13 18 16   CREATININE 0.72 0.92 0.98 1.07  CALCIUM 8.8* 8.4* 8.0* 8.1*  MG  --  1.9 1.7 2.1   GFR: Estimated Creatinine Clearance: 57 mL/min (by C-G formula based on SCr of 1.07 mg/dL). Liver Function Tests: Recent Labs  Lab 01/03/19 1548 01/04/19 0500 01/05/19 0343 01/06/19 0318  AST 224* 118* 60* 43*  ALT 250* 157* 96* 73*  ALKPHOS 253* 190* 131* 144*  BILITOT 3.1* 2.5* 2.8* 3.1*  PROT 7.5 6.2* 5.7* 6.3*  ALBUMIN 4.2 3.3* 2.7* 2.7*   Recent Labs  Lab 01/03/19 1548 01/04/19 0500  LIPASE 2,777* 960*   No results for input(s): AMMONIA in the last 168 hours. Coagulation Profile: Recent Labs  Lab 01/04/19 0500  INR 1.3*   Cardiac Enzymes: No results for input(s): CKTOTAL, CKMB, CKMBINDEX, TROPONINI in the last 168 hours. BNP (last 3  results) No results for input(s): PROBNP in the last 8760 hours. HbA1C: No results for input(s): HGBA1C in the last 72 hours. CBG: Recent Labs  Lab 01/05/19 0741 01/05/19 1156 01/05/19 1716 01/05/19 2105 01/06/19 0740  GLUCAP 157* 120* 135* 115* 157*   Lipid Profile: No results for input(s): CHOL, HDL, LDLCALC, TRIG, CHOLHDL, LDLDIRECT in the last 72 hours. Thyroid Function Tests: No results for input(s): TSH, T4TOTAL, FREET4, T3FREE, THYROIDAB in the last 72 hours. Anemia Panel: No results for input(s): VITAMINB12, FOLATE, FERRITIN, TIBC, IRON, RETICCTPCT in the last 72 hours. Sepsis Labs: No results for input(s): PROCALCITON, LATICACIDVEN in the last 168 hours.  Recent Results (from the past 240 hour(s))  Urine culture     Status: Abnormal   Collection Time: 01/03/19  2:17 PM   Specimen: Urine, Random  Result Value Ref Range Status   Specimen Description   Final    URINE, RANDOM Performed at [facility], [address] [place], [place] [address]    Special Requests   Final    NONE Performed at [facility], [address] [place], [place] [address]    Culture 20,000 COLONIES/mL ENTEROCOCCUS FAECALIS (  A)  Final   Report Status 01/06/2019 FINAL  Final   Organism ID, Bacteria ENTEROCOCCUS FAECALIS (A)  Final      Susceptibility   Enterococcus faecalis - MIC*    AMPICILLIN <=2 SENSITIVE Sensitive     LEVOFLOXACIN 1 SENSITIVE Sensitive     NITROFURANTOIN 32 SENSITIVE Sensitive     VANCOMYCIN 1 SENSITIVE Sensitive     * 20,000 COLONIES/mL ENTEROCOCCUS FAECALIS  SARS CORONAVIRUS 2 (TAT 6-24 HRS) Nasopharyngeal Nasopharyngeal Swab     Status: None   Collection Time: 01/03/19  8:36 PM   Specimen: Nasopharyngeal Swab  Result Value Ref Range Status   SARS Coronavirus 2 NEGATIVE NEGATIVE Final    Comment: (NOTE) SARS-CoV-2 target nucleic acids are NOT DETECTED. The SARS-CoV-2 RNA is generally detectable in upper and lower respiratory specimens  during the acute phase of infection. Negative results do not preclude SARS-CoV-2 infection, do not rule out co-infections with other pathogens, and should not be used as the sole basis for treatment or other patient management decisions. Negative results must be combined with clinical observations, patient history, and epidemiological information. The expected result is Negative. Fact Sheet for Patients: SugarRoll.be Fact Sheet for Healthcare Providers: https://www.woods-mathews.com/ This test is not yet approved or cleared by the [place] FDA and  has been authorized for detection and/or diagnosis of SARS-CoV-2 by FDA under an Emergency Use Authorization (EUA). This EUA will remain  in effect (meaning this test can be used) for the duration of the COVID-19 declaration under Section 56 4(b)(1) of the Act, 21 U.S.C. section 360bbb-3(b)(1), unless the authorization is terminated or revoked sooner. Performed at [facility], [place] [address] [place], [place] [address]          Radiology Studies: No results found.      Scheduled Meds: . dofetilide  500 mcg Oral BID  . insulin aspart  0-5 Units Subcutaneous QHS  . insulin aspart  0-9 Units Subcutaneous TID WC  . metoprolol succinate  50 mg Oral Daily  . sodium chloride flush  10-40 mL Intracatheter Q12H   Continuous Infusions: . sodium chloride 75 mL/hr at 01/06/19 0400  . heparin 1,000 Units/hr (01/06/19 0522)  . piperacillin-tazobactam (ZOSYN)  IV 3.375 g (01/06/19 0520)          Aline August, MD [facility] 01/06/2019, 7:56 AM

## 2019-01-06 NOTE — Progress Notes (Signed)
[place] for Heparin Indication: atrial fibrillation  Allergies  Allergen Reactions  . Bee Venom Anaphylaxis  . Latex Other (See Comments)    REDNESS AT REACTION SITE.  Marland Kitchen Metformin Diarrhea  . Rivaroxaban Other (See Comments)    Headaches, blurred vision  . Sotalol Other (See Comments)    "BLUE TOES"- cut his circulation off  . Warfarin Sodium Other (See Comments)    REACTION: migraine headaches/vision impariment    Patient Measurements: Height: 6\' 1"  (185.4 cm) Weight: 204 lb 12.9 oz (92.9 kg) IBW/kg (Calculated) : 79.9  Vital Signs: Temp: 98.3 F (36.8 C) (12/06 2110) Temp Source: Oral (12/06 2110) BP: 119/63 (12/06 2110) Pulse Rate: 103 (12/06 2110)  Labs: Recent Labs    01/03/19 1548 01/03/19 1755 01/04/19 0500 01/04/19 0844  01/05/19 0343 01/05/19 1655 01/05/19 2300 01/06/19 0318  HGB 14.4  --  12.7*  --   --  12.0*  --   --  12.1*  HCT 43.6  --  37.6*  --   --  34.9*  --   --  35.0*  PLT 138*  --  123*  --   --  107*  --   --  121*  APTT  --   --  38*  --    < > 158* 85* 199* 87*  LABPROT  --   --  16.2*  --   --   --   --   --   --   INR  --   --  1.3*  --   --   --   --   --   --   HEPARINUNFRC  --   --  1.54*  --   --  1.38*  --   --  0.81*  CREATININE 0.72  --  0.92  --   --  0.98  --   --   --   TROPONINIHS 10 11 20* 15  --   --   --   --   --    < > = values in this interval not displayed.    Estimated Creatinine Clearance: 62.3 mL/min (by C-G formula based on SCr of 0.98 mg/dL).    Assessment: 85 y.o. male admitted with abdominal pain, h/o Afib and Eliquis on hold now on heparin  12/7 AM update:  APTT therapeutic x 2 (85,87), the aPTT of 199 in between was drawn right next to heparin infusion   Goal of Therapy:  Heparin level 0.3-0.7 units/ml Monitor platelets by anticoagulation protocol: Yes   Plan:  Cont heparin at 1000 units/hr Daily CBC/aPTT/heparin level Monitor for bleeding  Narda Bonds, PharmD, BCPS Clinical Pharmacist Phone: [phone]

## 2019-01-06 NOTE — Progress Notes (Signed)
   I evaluated patient's presentation CT angio demonstrates infrarenal 5.7 cm aneurysm.  I discussed with the patient recommendation for repair but will wait for all work-up and possible procedures regarding pancreatitis to be completed.  I will have him follow-up in 1 to 2 months in my office to discuss probable endovascular aneurysm repair.  Patient demonstrates very good understanding.  Will be available as needed during this hospitalization.  Gissele Narducci C. Donzetta Matters, MD [facility] Office: [phone] Pager: [phone]

## 2019-01-06 NOTE — Plan of Care (Signed)

## 2019-01-07 DIAGNOSIS — Z95810 Presence of automatic (implantable) cardiac defibrillator: Secondary | ICD-10-CM | POA: Diagnosis not present

## 2019-01-07 DIAGNOSIS — I480 Paroxysmal atrial fibrillation: Secondary | ICD-10-CM | POA: Diagnosis not present

## 2019-01-07 DIAGNOSIS — I714 Abdominal aortic aneurysm, without rupture: Secondary | ICD-10-CM | POA: Diagnosis not present

## 2019-01-07 LAB — CBC
HCT: 30.5 % — ABNORMAL LOW (ref 39.0–52.0)
Hemoglobin: 10.6 g/dL — ABNORMAL LOW (ref 13.0–17.0)
MCH: 35.1 pg — ABNORMAL HIGH (ref 26.0–34.0)
MCHC: 34.8 g/dL (ref 30.0–36.0)
MCV: 101 fL — ABNORMAL HIGH (ref 80.0–100.0)
Platelets: 125 10*3/uL — ABNORMAL LOW (ref 150–400)
RBC: 3.02 MIL/uL — ABNORMAL LOW (ref 4.22–5.81)
RDW: 13.4 % (ref 11.5–15.5)
WBC: 8.3 10*3/uL (ref 4.0–10.5)
nRBC: 0 % (ref 0.0–0.2)

## 2019-01-07 LAB — GLUCOSE, CAPILLARY
Glucose-Capillary: 148 mg/dL — ABNORMAL HIGH (ref 70–99)
Glucose-Capillary: 120 mg/dL — ABNORMAL HIGH (ref 70–99)
Glucose-Capillary: 144 mg/dL — ABNORMAL HIGH (ref 70–99)
Glucose-Capillary: 125 mg/dL — ABNORMAL HIGH (ref 70–99)

## 2019-01-07 LAB — COMPREHENSIVE METABOLIC PANEL
ALT: 54 U/L — ABNORMAL HIGH (ref 0–44)
AST: 37 U/L (ref 15–41)
Albumin: 2.4 g/dL — ABNORMAL LOW (ref 3.5–5.0)
Alkaline Phosphatase: 109 U/L (ref 38–126)
Anion gap: 10 (ref 5–15)
BUN: 14 mg/dL (ref 8–23)
CO2: 22 mmol/L (ref 22–32)
Calcium: 7.9 mg/dL — ABNORMAL LOW (ref 8.9–10.3)
Chloride: 99 mmol/L (ref 98–111)
Creatinine, Ser: 0.79 mg/dL (ref 0.61–1.24)
GFR calc Af Amer: >60 mL/min (ref >60)
GFR calc non Af Amer: >60 mL/min (ref >60)
Glucose, Bld: 147 mg/dL — ABNORMAL HIGH (ref 70–99)
Potassium: 3.3 mmol/L — ABNORMAL LOW (ref 3.5–5.1)
Sodium: 131 mmol/L — ABNORMAL LOW (ref 135–145)
Total Bilirubin: 1.9 mg/dL — ABNORMAL HIGH (ref 0.3–1.2)
Total Protein: 5.4 g/dL — ABNORMAL LOW (ref 6.5–8.1)

## 2019-01-07 LAB — HEPARIN LEVEL (UNFRACTIONATED): Heparin Unfractionated: 0.32 IU/mL (ref 0.30–0.70)

## 2019-01-07 LAB — C DIFFICILE QUICK SCREEN W PCR REFLEX
C Diff antigen: NEGATIVE
C Diff interpretation: NOT DETECTED
C Diff toxin: NEGATIVE

## 2019-01-07 LAB — APTT: aPTT: 62 seconds — ABNORMAL HIGH (ref 24–36)

## 2019-01-07 LAB — MAGNESIUM: Magnesium: 1.9 mg/dL (ref 1.7–2.4)

## 2019-01-07 LAB — LIPASE, BLOOD: Lipase: 31 U/L (ref 11–51)

## 2019-01-07 MED ORDER — IPRATROPIUM-ALBUTEROL 0.5-2.5 (3) MG/3ML IN SOLN: 3.0000 mL | RESPIRATORY_TRACT | Status: DC | PRN

## 2019-01-07 MED ORDER — UMECLIDINIUM BROMIDE 62.5 MCG/INH IN AEPB
1.0000 | INHALATION_SPRAY | Freq: Every day | RESPIRATORY_TRACT | Status: DC
  Administered 2019-01-08 – 2019-01-13 (×5): 1 via RESPIRATORY_TRACT
  Filled 2019-01-07: qty 7

## 2019-01-07 MED ORDER — LIDOCAINE 2% (20 MG/ML) 5 ML SYRINGE
INTRAMUSCULAR | Status: AC
  Filled 2019-01-07: qty 5

## 2019-01-07 MED ORDER — ONDANSETRON HCL 4 MG/2ML IJ SOLN
INTRAMUSCULAR | Status: AC
  Filled 2019-01-07: qty 2

## 2019-01-07 MED ORDER — ARFORMOTEROL TARTRATE 15 MCG/2ML IN NEBU
15.0000 ug | INHALATION_SOLUTION | Freq: Two times a day (BID) | RESPIRATORY_TRACT | Status: DC
  Administered 2019-01-07 – 2019-01-12 (×10): 15 ug via RESPIRATORY_TRACT
  Filled 2019-01-07 (×11): qty 2

## 2019-01-07 NOTE — Evaluation (Signed)
Physical Therapy Evaluation Patient Details Name: [name] MRN: [id number] DOB: 22-May-1931 Today's Date: 01/07/2019   History of Present Illness  85 yo male admitted for lower abdominal pain and acute pancreatitis. PMH including but not limited to COPD, SOB, orthopnea, CHF, hyponatremia, V-tach, PAF, ICD placement, DM, mitral valve replacement, knee arthroscopy, CABG, cardiac cath, A-fib  Clinical Impression  Pt admitted for above diagnosis. He was able to perform bed mobility and transfers modified independent. He completed gait training and stair navigation min guard for safety, but pt became SOB following stairs. Unable to get a read with pulse ox until back in the room in the recliner, and his O2 sat was 85%. Gave him 2L O2 at rest and pt's sats went up to 97%. Tried another round of gait training with oxygen, and pt required 3L O2 in order to remain above 88% O2 sat. Once back in the room and after ~2 mins, able to remove nasal cannula and pt's sats 96% on room air. Pt states he uses inhaler at home for COPD medication and that he has not had any since admission. MD notified about inhaler and O2 requirements with activity. Recommend HH PT to address deficits upon discharge. Recommend pt use walker at home due to endurance issues, but pt indicates he has ample equipment and does not need any additional equipment. Pt has deficits in endurance and strength and would benefit from acute skilled PT to improve independence prior to discharge.      Follow Up Recommendations Home health PT    Equipment Recommendations  None recommended by PT    Recommendations for Other Services       Precautions / Restrictions Precautions Precautions: None Restrictions Weight Bearing Restrictions: No      Mobility  Bed Mobility Overal bed mobility: Modified Independent             General bed mobility comments: minimal increased time, but pt able to perform mod I  Transfers Overall transfer  level: Modified independent               General transfer comment: pt did not require AD for transfers; no physical assistance, no episodes of unsteadiness or LOB; pt reports he feels fine after transfers  Ambulation/Gait Ambulation/Gait assistance: Supervision Gait Distance (Feet): 250 Feet Assistive device: Rolling walker (2 wheeled);None Gait Pattern/deviations: WFL(Within Functional Limits) Gait velocity: decreased   General Gait Details: pt's gait pattern WFL; after stairs, noticed SOB; took rest break in room but required 3L O2 during second round of gait to stay above 88%  Stairs Stairs: Yes Stairs assistance: Min guard Stair Management: One rail Right;One rail Left;Forwards;Alternating pattern Number of Stairs: 4 General stair comments: pt able to navigate 4 stairs min guard for safety and line management; stair training limited by IV pole/lines; pt became SOB afterwards   Wheelchair Mobility    Modified Rankin (Stroke Patients Only)       Balance Overall balance assessment: Modified Independent                                           Pertinent Vitals/Pain Pain Assessment: No/denies pain    Home Living Family/patient expects to be discharged to:: Private residence Living Arrangements: Spouse/significant other;Children Available Help at Discharge: Family;Available 24 hours/day Type of Home: House Home Access: Level entry Entrance Stairs-Rails: Can reach both Entrance Stairs-Number of Steps:  0 Home Layout: Two level Home Equipment: Shower seat;Bedside commode;Walker - 2 wheels;Cane - single point Additional Comments: pt states he lives in the basement apartment at his daughter's home; he states there is a separate entrance for the basement but that he goes upstairs to the main level every day for dinner (12 steps)    Prior Function Level of Independence: Independent               Hand Dominance   Dominant Hand: Right     Extremity/Trunk Assessment   Upper Extremity Assessment Upper Extremity Assessment: Defer to OT evaluation    Lower Extremity Assessment Lower Extremity Assessment: Overall WFL for tasks assessed    Cervical / Trunk Assessment Cervical / Trunk Assessment: Normal  Communication   Communication: No difficulties  Cognition Arousal/Alertness: Awake/alert Behavior During Therapy: WFL for tasks assessed/performed Overall Cognitive Status: Within Functional Limits for tasks assessed                                        General Comments      Exercises     Assessment/Plan    PT Assessment Patient needs continued PT services  PT Problem List Decreased strength;Decreased range of motion;Decreased activity tolerance;Decreased balance;Decreased mobility;Decreased knowledge of use of DME;Decreased safety awareness;Cardiopulmonary status limiting activity       PT Treatment Interventions DME instruction;Gait training;Stair training;Functional mobility training;Therapeutic activities;Therapeutic exercise;Balance training;Modalities;Patient/family education    PT Goals (Current goals can be found in the Care Plan section)  Acute Rehab PT Goals Patient Stated Goal: to go home PT Goal Formulation: With patient Time For Goal Achievement: 01/21/19 Potential to Achieve Goals: Good    Frequency Min 3X/week   Barriers to discharge        Co-evaluation               AM-PAC PT "6 Clicks" Mobility  Outcome Measure Help needed turning from your back to your side while in a flat bed without using bedrails?: None Help needed moving from lying on your back to sitting on the side of a flat bed without using bedrails?: None Help needed moving to and from a bed to a chair (including a wheelchair)?: None Help needed standing up from a chair using your arms (e.g., wheelchair or bedside chair)?: None Help needed to walk in hospital room?: None Help needed climbing 3-5  steps with a railing? : A Little 6 Click Score: 23    End of Session Equipment Utilized During Treatment: Gait belt;Oxygen Activity Tolerance: Patient limited by fatigue;Patient tolerated treatment well Patient left: in chair;with call bell/phone within reach Nurse Communication: Mobility status PT Visit Diagnosis: Difficulty in walking, not elsewhere classified (R26.2);Muscle weakness (generalized) (M62.81)    Time: 4696-2952 PT Time Calculation (min) (ACUTE ONLY): 45 min   Charges:   PT Evaluation $PT Eval Moderate Complexity: 1 Mod PT Treatments $Gait Training: 23-37 mins        Wyvonna Plum, SPT  Ramello Cordial 01/07/2019, 10:52 AM

## 2019-01-07 NOTE — Progress Notes (Signed)
Progress Note  Patient Name: [name] Date of Encounter: 01/07/2019  Primary Cardiologist: Sherren Mocha, MD   Subjective   Patient is doing OK this morning. No chest pain. General Surgery consulted for plan. Yesterday's EKG shows QtC 623 ms.   Inpatient Medications    Scheduled Meds:  dofetilide  500 mcg Oral BID   insulin aspart  0-5 Units Subcutaneous QHS   insulin aspart  0-9 Units Subcutaneous TID WC   metoprolol succinate  50 mg Oral Daily   pantoprazole (PROTONIX) IV  40 mg Intravenous Q24H   sodium chloride flush  10-40 mL Intracatheter Q12H   Continuous Infusions:  sodium chloride 75 mL/hr at 01/06/19 2329   heparin 1,000 Units/hr (01/07/19 0524)   piperacillin-tazobactam (ZOSYN)  IV 3.375 g (01/07/19 0526)   PRN Meds: acetaminophen **OR** acetaminophen, alum & mag hydroxide-simeth, ibuprofen, metoCLOPramide (REGLAN) injection, morphine injection, ondansetron **OR** ondansetron (ZOFRAN) IV, sodium chloride flush   Vital Signs    Vitals:   01/06/19 0500 01/06/19 0520 01/06/19 2042 01/07/19 0500  BP:  104/73 (!) 121/91 125/79  Pulse:  97 87 81  Resp:  17 19 18   Temp:  97.8 F (36.6 C) 98.7 F (37.1 C) 98 F (36.7 C)  TempSrc:  Oral Oral Oral  SpO2:  95% 97% 95%  Weight: 92.9 kg     Height:       No intake or output data in the 24 hours ending 01/07/19 0756 Last 3 Weights 01/06/2019 01/05/2019 01/04/2019  Weight (lbs) 204 lb 12.9 oz 204 lb 12.9 oz 204 lb 12.9 oz  Weight (kg) 92.9 kg 92.9 kg 92.9 kg      Telemetry    N/A- Personally Reviewed  ECG    Yesterday EKG showed V-paced rhythm, 85 bpm, Qtc 666ms - Personally Reviewed  Physical Exam   GEN: No acute distress.   Neck: No JVD Cardiac: RRR, no murmurs, rubs, or gallops.  Respiratory: Clear to auscultation bilaterally. GI: Soft, nontender, non-distended  MS: No edema; No deformity. Neuro:  Nonfocal  Psych: Normal affect   Labs    High Sensitivity Troponin:   Recent Labs   Lab 01/03/19 1548 01/03/19 1755 01/04/19 0500 01/04/19 0844 01/06/19 1209  TROPONINIHS 10 11 20* 15 24*      Chemistry Recent Labs  Lab 01/05/19 0343 01/06/19 0318 01/07/19 0319  NA 129* 132* 131*  K 4.2 3.7 3.3*  CL 97* 97* 99  CO2 24 19* 22  GLUCOSE 189* 169* 147*  BUN 18 16 14   CREATININE 0.98 1.07 0.79  CALCIUM 8.0* 8.1* 7.9*  PROT 5.7* 6.3* 5.4*  ALBUMIN 2.7* 2.7* 2.4*  AST 60* 43* 37  ALT 96* 73* 54*  ALKPHOS 131* 144* 109  BILITOT 2.8* 3.1* 1.9*  GFRNONAA >60 >60 >60  GFRAA >60 >60 >60  ANIONGAP 8 16* 10     Hematology Recent Labs  Lab 01/05/19 0343 01/06/19 0318 01/07/19 0319  WBC 12.7* 12.4* 8.3  RBC 3.43* 3.48* 3.02*  HGB 12.0* 12.1* 10.6*  HCT 34.9* 35.0* 30.5*  MCV 101.7* 100.6* 101.0*  MCH 35.0* 34.8* 35.1*  MCHC 34.4 34.6 34.8  RDW 13.7 13.6 13.4  PLT 107* 121* 125*    BNP Recent Labs  Lab 01/03/19 1548  BNP 295.0*     DDimer No results for input(s): DDIMER in the last 168 hours.   Radiology    Ct Abdomen W Contrast  Result Date: 01/06/2019 CLINICAL DATA:  Pancreatitis. EXAM: CT ABDOMEN WITH  CONTRAST TECHNIQUE: Multidetector CT imaging of the abdomen was performed using the standard protocol following bolus administration of intravenous contrast. CONTRAST:  145mL OMNIPAQUE IOHEXOL 300 MG/ML  SOLN COMPARISON:  CTA 01/03/2019 FINDINGS: Lower chest: The heart is enlarged. No substantial pericardial effusion. Small bilateral pleural effusions evident. Hepatobiliary: No suspicious focal abnormality within the liver parenchyma. Gallbladder is distended with tiny layering gallstones evident, measuring up to 4 mm diameter. Mild intrahepatic biliary duct dilatation associated with extrahepatic common duct measuring 13 mm diameter. Common bile duct just proximal to the ampulla is 12 mm diameter. No calcified stone discernible within the extrahepatic biliary system. Pancreas: Pancreatic parenchyma is ill-defined with peripancreatic edema noted  diffusely but most prominent in the region of the pancreatic head. No dilatation of the main pancreatic duct. Pancreatic parenchyma enhances throughout. Spleen: No splenomegaly. No focal mass lesion. Adrenals/Urinary Tract: No adrenal nodule or mass. 3.1 cm exophytic cyst noted interpolar left kidney. Right kidney unremarkable. No evidence for hydroureter. The urinary bladder appears normal for the degree of distention. Stomach/Bowel: Stomach is unremarkable. No gastric wall thickening. No evidence of outlet obstruction. Duodenum is normally positioned as is the ligament of Treitz. No small bowel wall thickening. No small bowel dilatation. The terminal ileum is normal. The appendix measures up to 13 mm diameter with a fluid-filled lumen. CT scan yesterday showed gas diffusely in the lumen of the appendix with appendiceal diameter maximal at 8 mm. There is fluid/edema around the gallbladder on today's study, progressive in the interval. No gross colonic mass. No colonic wall thickening. Diverticular changes are noted in the left colon without evidence of diverticulitis. Vascular/Lymphatic: Abdominal aortic aneurysm is stable in the interval measuring 5.7 cm diameter today. Insert calcis crash that There is no gastrohepatic or hepatoduodenal ligament lymphadenopathy. No intraperitoneal or retroperitoneal lymphadenopathy. No pelvic sidewall lymphadenopathy. Other: Small volume free fluid noted adjacent to the liver and spleen. There is fluid in the para colic gutters bilaterally. Retroperitoneal edema/inflammation is seen in the anterior pararenal space and tracking in the extraperitoneal tissues towards the pelvis, right greater than left. Musculoskeletal: Small bilateral groin hernias contain only fat. Posttraumatic deformity noted right iliac bone. Compression deformity in the in the L4 vertebral body is stable. IMPRESSION: 1. Peripancreatic edema/inflammation, similar to prior. Acute pancreatitis could have this  appearance. There is no dilatation of the main pancreatic duct. No findings to suggest pancreatic necrosis. 2. No focal or rim enhancing fluid collection in the abdomen/pelvis. 3. Distended gallbladder with calcified gallstones. 4. Intra and extrahepatic biliary duct dilatation with extrahepatic common duct measuring up to 13 mm. No calcified stone visualized within the extrahepatic bile ducts on today's study. MRCP or ERCP may prove helpful to further evaluate as clinically warranted. 5. Since the prior study, the appendix is become more distended and fluid-filled. Appendiceal diameter is abnormal today at 13 mm. Appendiceal lumen is diffusely air-filled on the study from 3 days ago. Features on today's exam may be secondary to irritation from the intraperitoneal fluid and adjacent extraperitoneal inflammatory change. Acute appendicitis cannot be definitively excluded by imaging on this study. 6. Small volume intraperitoneal free fluid. 7. Small bilateral groin hernias contain only fat. 8. Abdominal aortic aneurysm. Vascular surgery consultation recommended due to increased risk of rupture for AAA >5.5 cm. This recommendation follows ACR consensus guidelines: White Paper of the ACR Incidental Findings Committee II on Vascular Findings. J Am Coll Radiol 2013; 10:789-794. Aortic aneurysm NOS (ICD10-I71.9) Electronically Signed   By: Verda Cumins.D.  On: 01/06/2019 13:42    Cardiac Studies   Echo 11/05/18 1. Left ventricular ejection fraction, by visual estimation, is 35 to 40%. The left ventricle has moderately decreased function. There is global left ventricular hypokinesis and marked apical-basal and septal-lateral dyssynchrony. There appears to be  apical akinesis. 2. Abnormal septal motion consistent with RV pacemaker. 3. Global right ventricle has moderately reduced systolic function.The right ventricular size is normal. No increase in right ventricular wall thickness. 4. Left atrial size was  severely dilated. 5. Right atrial size was moderately dilated. 6. Mild mitral valve regurgitation. Mild-moderate mitral stenosis. Peak and mean gradients are 16 and 5 mm Hg, respectively, at 70 bpm. Estimated valve area is 1.4 cm sq by the PHT method. 7. Moderate calcification of the mitral valve leaflet(s). 8. Severe mitral annular calcification. 9. Severe thickening of the mitral valve leaflet(s). 10. The tricuspid valve is normal in structure. Tricuspid valve regurgitation is mild-moderate. 11. The aortic valve is normal in structure. Aortic valve regurgitation was not visualized by color flow Doppler. 12. The pulmonic valve was normal in structure. Pulmonic valve regurgitation is mild by color flow Doppler. 13. Mildly elevated pulmonary artery systolic pressure. 14. A pacer wire is visualized. 15. The inferior vena cava is dilated in size with >50% respiratory variability, suggesting right atrial pressure of 8 mmHg. 16. Left ventricular diastolic function could not be evaluated due to atrial fibrillation and mitral stenosis. 22. Compared to February 2020, mitral valve gradients are worse, probably due to new atrial fibrillation and increased ventricular rate.  Cardiac cath 11/05/2016 1. Severe 2 vessel CAD with continued patency of the LIMA-LAD and right radial graft-OM 2. Moderate, severely calcified, proximal circumflex stenosis with negative FFR assessment 3. Normal right heart hemodynamics except for a prominent V wave in the PCWP tracing 4. Normal/low LVEDP  Recommend: ongoing medical therapy. Pt appears to be well-compensated from a hemodynamic perspective.   Patient Profile     85 y.o. male  with a hx of CAD s/p CABG, mitral valve repair, polymorphic VT s/p ICD Med[employer]onic dual chamber, PFO closure, Persistent afib on Eliquis, Chronic systolic and diastolic HF (EF [id number]), HTN, DM, SIADH, carotid artery disease s/p R CEA 10/2018 and HLD who is being seen today for the  evaluation of pre-operative cardiac evaluation for acute pancreatitis   Assessment & Plan    Pre-operative evaluation for acute biliary pancreatitis and possible appendicitis - Patient presented with severe abdominal pain. LFTs and WBC were elevated. CT abdomen showed possible acue pancreatitis with dilated CBD and gallstones concerning for infection. Placed on IVF and abx. GI following. Patient cannot have MRCP 2/2 AICD. Follow up CT showed incidental appendicitis as well, however patient has no acute pain.  - General surgery prefer medical management at this time. - From a cardiac standpoint patient is at increased risk for surgery given multiple comorbidities, but able to undergo procedure if needed. No plan for further cardiac work-up at this time.  Atypical Chest Pain - HS troponin 11>20>15>24 Trend not consistent with ACS - EKG with no ischemic changes - Last cath in 2018 showed patent grafts and residual Cx disease  Infrarenal abdominal Aortic aneurysm 5.7 mm with no dissection - Vascular plan for surgery after acute pancreatitis is resolved  Paroxysmal Afib - Has been rate controlled - EKG from yesterday with QTc up to 623 ms. Will hold toksyn for now. Repeat EKG this AM. MD to see - Eliquis held and placed on heparin drip>>restart Eliquis per  GI  - Monitor QTc with daily EKG  Chronic systolic HF - EF in October 2020 showed 35-40% - Appears euvolemic on exam   For questions or updates, please contact [facility] Please consult [email or web address] for contact info under        Signed, Ellianah Cordy Ninfa Meeker, PA-C  01/07/2019, 7:56 AM

## 2019-01-07 NOTE — Progress Notes (Addendum)
[facility] Gastroenterology Progress Note  [name] 85 y.o. 17-Oct-1931  CC: Abdominal pain, pancreatitis   Subjective: Patient seen and examined at bedside.  Sitting comfortably in the chair.  Denies any abdominal pain, nausea or vomiting.  Complaining of diarrhea with multiple loose stools this morning.  Denies any blood in the stool.  ROS :  negative for fever and shortness of breath.   Objective: Vital signs in last 24 hours: Vitals:   01/06/19 2042 01/07/19 0500  BP: (!) 121/91 125/79  Pulse: 87 81  Resp: 19 18  Temp: 98.7 F (37.1 C) 98 F (36.7 C)  SpO2: 97% 95%    Physical Exam:  General:  Alert, cooperative, no distress, appears stated age  Head:  Normocephalic, without obvious abnormality, atraumatic  Eyes:  , EOM's intact,   Lungs:   Clear to auscultation bilaterally, respirations unlabored  Heart:  Regular rate and rhythm, S1, S2 normal  Abdomen:   Soft, mild generalized discomfort on palpation, bowel sounds present, no peritoneal signs  Extremities: Extremities normal, atraumatic, no  edema       Lab Results: Recent Labs    01/06/19 0318 01/07/19 0319  NA 132* 131*  K 3.7 3.3*  CL 97* 99  CO2 19* 22  GLUCOSE 169* 147*  BUN 16 14  CREATININE 1.07 0.79  CALCIUM 8.1* 7.9*  MG 2.1 1.9   Recent Labs    01/06/19 0318 01/07/19 0319  AST 43* 37  ALT 73* 54*  ALKPHOS 144* 109  BILITOT 3.1* 1.9*  PROT 6.3* 5.4*  ALBUMIN 2.7* 2.4*   Recent Labs    01/06/19 0318 01/07/19 0319  WBC 12.4* 8.3  HGB 12.1* 10.6*  HCT 35.0* 30.5*  MCV 100.6* 101.0*  PLT 121* 125*   No results for input(s): LABPROT, INR in the last 72 hours.    Assessment/Plan: -Acute pancreatitis.  Most likely gallstone.  Abdominal pain resolved. -Abnormal LFTs.  Improving. -Substernal chest pain.  Resolved. -Coronary artery disease, peripheral vascular disease, history of AICD placement -Chronic anticoagulation with apixaban.  On heparin drip now - CHF with EF    Recommendations ------------------------- -CT scan report reviewed.  Appreciate surgery and cardiology input. -Patient is at increased risk for surgery given multiple comorbidities per cardiology. -Patient's LFTs are improving.  T bili is down to 1.9 today.  His abdominal pain has resolved. - D/W Dr. Watt Climes. We had long discussion.  He recommended EUS first and ERCP only if EUS positive for choledocholithiasis versus cholecystectomy with IOC and ERCP if positive IOC. -I will discuss with Dr. Paulita Fujita as well as surgical team.  Patient in need for possible appendectomy as well. -Continue conservative management with antibiotics for now.  -Check GI pathogen panel and C Diff for diarrhea,  Approximately 35 minutes spent in patient care with more than 50% time in care coordination. Otis Brace MD, [place] 01/07/2019, 10:52 AM  Contact #  [phone]

## 2019-01-07 NOTE — Progress Notes (Signed)
SATURATION QUALIFICATIONS: (This note is used to comply with regulatory documentation for home oxygen)  Patient Saturations on Room Air at Rest = 96%  Patient Saturations on Room Air while Ambulating = 85%  Patient Saturations on 3 Liters of oxygen while Ambulating = >88%  Please briefly explain why patient needs home oxygen: Pt became very SOB after completing 4 stairs and 50 ft of gait training, after which is O2 sat decreased to 85%. He required 3 L of O2 while ambulating to remain above >88%. Recommend pt use O2 at home with activity.  Christel Mormon, SPT

## 2019-01-07 NOTE — Progress Notes (Signed)
Patient ID: [name], male   DOB: 1931-04-20, 85 y.o.   MRN: [id number]  PROGRESS NOTE    [name]  [id number] DOB: 1931/04/13 DOA: 01/03/2019 PCP: [name], MD   Brief Narrative:  85 year old male with history of CAD status post CABG, chronic systolic heart failure status post AICD placement, A. fib on apixaban and [name] and recent reaction to amiodarone, recent carotid endarterectomy, recent admission for CHF in October 2020 presented on 01/03/2019 with abdominal pain.  He was found to have elevated LFTs and bilirubin along with elevated lipase.  CT of the abdomen and pelvis showed features concerning for acute pancreatitis with dilated CBD and gallstones with ductal enhancement concerning for infection.  He was started on IV fluids and antibiotics.  GI was consulted.  Vascular surgery was consulted for infrarenal abdominal aortic aneurysm.  Assessment & Plan:   Acute biliary pancreatitis Cholelithiasis with dilated CBD -CT of the abdomen pelvis showed features concerning for acute pancreatitis with dilated CBD and gallstones with ductal enhancement concerning for infection -Currently being managed with IV fluids and analgesics.   -Currently on normal saline at 75 cc an hour.  Will decrease to 50 cc an hour. -Patient cannot have an MRCP because of AICD. -GI following.  Diet advancement as per GI.  Will follow up with further recommendations from GI regarding timing of ERCP versus cholecystectomy by general surgery.  Will trend LFTs.  LFTs are improving including total bilirubin. -Currently on empiric broad-spectrum antibiotics.   -Repeat CT of the abdomen on 01/06/2019 showed features of acute pancreatitis with no pancreatic necrosis along with distended gallbladder with gallstones and intra and extrahepatic biliary dilatation with CBD up to 13 mm.  There was question of appendicitis as well.  General surgery was consulted for the same and recommended medical management  for now.  Leukocytosis -Probably from.  Resolved  Infrarenal abdominal aortic aneurysm measuring 5.7 cm with no dissection -Patient will need elective vascular surgery intervention once he has resolved from gallstone pancreatitis.  Vascular surgery consultation appreciated.  Paroxysmal A. fib -Currently rate controlled.  Eliquis on hold.  Continue heparin drip for now. -Continue [name] and metoprolol.  Hyponatremia in a patient with history of recent diagnosis of SIADH -Improving.  Monitor sodium level closely.  IV fluid as above.  Hypokalemia -Replace.  Repeat a.m. labs  Chronic systolic heart failure with history of AICD -Last EF of 35 to 40% in October 2020. -Currently no signs of fluid overload.  Strict input output and daily weights. -Cardiology evaluation appreciated.  Diabetes mellitus type 2 -Continue CBGs with SSI  Thrombocytopenia -Questionable cause.  Improving.  Monitor  Macrocytic anemia -Questionable cause.  Monitor.  Will check vitamin B12, folate and TSH.  Generalized deconditioning -We will request PT evaluation  DVT prophylaxis: Heparin Code Status: Full Family Communication: Discussed with patient  disposition Plan: Depends on clinical outcome  Consultants: Gastroenterology.  Vascular surgery.  Cardiology Procedures: None Antimicrobials: Zosyn from 01/03/2019 onwards   Subjective: Patient seen and examined at bedside.  Feels much better this morning.  Currently denies any nausea or abdominal pain.  Tolerating full liquid diet.  No overnight fever or vomiting.   Objective: Vitals:   01/06/19 0500 01/06/19 0520 01/06/19 2042 01/07/19 0500  BP:  104/73 (!) 121/91 125/79  Pulse:  97 87 81  Resp:  17 19 18   Temp:  97.8 F (36.6 C) 98.7 F (37.1 C) 98 F (36.7 C)  TempSrc:  Oral Oral Oral  SpO2:  95% 97% 95%  Weight: 92.9 kg     Height:       No intake or output data in the 24 hours ending 01/07/19 0758 Filed Weights   01/04/19 0242  01/05/19 0500 01/06/19 0500  Weight: 92.9 kg 92.9 kg 92.9 kg    Examination:  General exam: No distress.  Looks better this morning. Respiratory system: Bilateral decreased breath sounds at bases with some scattered crackles  cardiovascular system: S1-S2 heard, rate controlled Gastrointestinal system: Abdomen is nondistended, soft and nontender this morning.  Normal bowel sounds heard. Extremities: Trace edema, no cyanosis Central nervous system: Awake, alert and oriented. No focal neurological deficits. Moving extremities Skin: No ulcers or rashes. Psychiatry: Normal mood and judgment.    Data Reviewed: I have personally reviewed following labs and imaging studies  CBC: Recent Labs  Lab 01/03/19 1548 01/04/19 0500 01/05/19 0343 01/06/19 0318 01/07/19 0319  WBC 9.0 11.8* 12.7* 12.4* 8.3  NEUTROABS 7.8* 10.3*  --   --   --   HGB 14.4 12.7* 12.0* 12.1* 10.6*  HCT 43.6 37.6* 34.9* 35.0* 30.5*  MCV 103.3* 102.5* 101.7* 100.6* 101.0*  PLT 138* 123* 107* 121* 0000000*   Basic Metabolic Panel: Recent Labs  Lab 01/03/19 1548 01/04/19 0500 01/05/19 0343 01/06/19 0318 01/07/19 0319  NA 128* 131* 129* 132* 131*  K 4.0 4.6 4.2 3.7 3.3*  CL 95* 99 97* 97* 99  CO2 23 24 24  19* 22  GLUCOSE 171* 133* 189* 169* 147*  BUN 17 13 18 16 14   CREATININE 0.72 0.92 0.98 1.07 0.79  CALCIUM 8.8* 8.4* 8.0* 8.1* 7.9*  MG  --  1.9 1.7 2.1 1.9   GFR: Estimated Creatinine Clearance: 76.3 mL/min (by C-G formula based on SCr of 0.79 mg/dL). Liver Function Tests: Recent Labs  Lab 01/03/19 1548 01/04/19 0500 01/05/19 0343 01/06/19 0318 01/07/19 0319  AST 224* 118* 60* 43* 37  ALT 250* 157* 96* 73* 54*  ALKPHOS 253* 190* 131* 144* 109  BILITOT 3.1* 2.5* 2.8* 3.1* 1.9*  PROT 7.5 6.2* 5.7* 6.3* 5.4*  ALBUMIN 4.2 3.3* 2.7* 2.7* 2.4*   Recent Labs  Lab 01/03/19 1548 01/04/19 0500 01/07/19 0319  LIPASE 2,777* 960* 31   No results for input(s): AMMONIA in the last 168 hours. Coagulation  Profile: Recent Labs  Lab 01/04/19 0500  INR 1.3*   Cardiac Enzymes: No results for input(s): CKTOTAL, CKMB, CKMBINDEX, TROPONINI in the last 168 hours. BNP (last 3 results) No results for input(s): PROBNP in the last 8760 hours. HbA1C: No results for input(s): HGBA1C in the last 72 hours. CBG: Recent Labs  Lab 01/05/19 2105 01/06/19 0740 01/06/19 1229 01/06/19 1712 01/06/19 2125  GLUCAP 115* 157* 138* 113* 106*   Lipid Profile: No results for input(s): CHOL, HDL, LDLCALC, TRIG, CHOLHDL, LDLDIRECT in the last 72 hours. Thyroid Function Tests: No results for input(s): TSH, T4TOTAL, FREET4, T3FREE, THYROIDAB in the last 72 hours. Anemia Panel: No results for input(s): VITAMINB12, FOLATE, FERRITIN, TIBC, IRON, RETICCTPCT in the last 72 hours. Sepsis Labs: No results for input(s): PROCALCITON, LATICACIDVEN in the last 168 hours.  Recent Results (from the past 240 hour(s))  Urine culture     Status: Abnormal   Collection Time: 01/03/19  2:17 PM   Specimen: Urine, Random  Result Value Ref Range Status   Specimen Description   Final    URINE, RANDOM Performed at [facility], [address] [place], [place] [address]  Special Requests   Final    NONE Performed at [facility], [address] [place], [place] [address]    Culture 20,000 COLONIES/mL ENTEROCOCCUS FAECALIS (A)  Final   Report Status 01/06/2019 FINAL  Final   Organism ID, Bacteria ENTEROCOCCUS FAECALIS (A)  Final      Susceptibility   Enterococcus faecalis - MIC*    AMPICILLIN <=2 SENSITIVE Sensitive     LEVOFLOXACIN 1 SENSITIVE Sensitive     NITROFURANTOIN 32 SENSITIVE Sensitive     VANCOMYCIN 1 SENSITIVE Sensitive     * 20,000 COLONIES/mL ENTEROCOCCUS FAECALIS  SARS CORONAVIRUS 2 (TAT 6-24 HRS) Nasopharyngeal Nasopharyngeal Swab     Status: None   Collection Time: 01/03/19  8:36 PM   Specimen: Nasopharyngeal Swab  Result Value Ref Range Status   SARS Coronavirus 2 NEGATIVE  NEGATIVE Final    Comment: (NOTE) SARS-CoV-2 target nucleic acids are NOT DETECTED. The SARS-CoV-2 RNA is generally detectable in upper and lower respiratory specimens during the acute phase of infection. Negative results do not preclude SARS-CoV-2 infection, do not rule out co-infections with other pathogens, and should not be used as the sole basis for treatment or other patient management decisions. Negative results must be combined with clinical observations, patient history, and epidemiological information. The expected result is Negative. Fact Sheet for Patients: SugarRoll.be Fact Sheet for Healthcare Providers: https://www.woods-mathews.com/ This test is not yet approved or cleared by the [place] FDA and  has been authorized for detection and/or diagnosis of SARS-CoV-2 by FDA under an Emergency Use Authorization (EUA). This EUA will remain  in effect (meaning this test can be used) for the duration of the COVID-19 declaration under Section 56 4(b)(1) of the Act, 21 U.S.C. section 360bbb-3(b)(1), unless the authorization is terminated or revoked sooner. Performed at [facility], [place] [address], [place], [place] [address]          Radiology Studies: Ct Abdomen W Contrast  Result Date: 01/06/2019 CLINICAL DATA:  Pancreatitis. EXAM: CT ABDOMEN WITH CONTRAST TECHNIQUE: Multidetector CT imaging of the abdomen was performed using the standard protocol following bolus administration of intravenous contrast. CONTRAST:  180mL OMNIPAQUE IOHEXOL 300 MG/ML  SOLN COMPARISON:  CTA 01/03/2019 FINDINGS: Lower chest: The heart is enlarged. No substantial pericardial effusion. Small bilateral pleural effusions evident. Hepatobiliary: No suspicious focal abnormality within the liver parenchyma. Gallbladder is distended with tiny layering gallstones evident, measuring up to 4 mm diameter. Mild intrahepatic biliary duct dilatation associated  with extrahepatic common duct measuring 13 mm diameter. Common bile duct just proximal to the ampulla is 12 mm diameter. No calcified stone discernible within the extrahepatic biliary system. Pancreas: Pancreatic parenchyma is ill-defined with peripancreatic edema noted diffusely but most prominent in the region of the pancreatic head. No dilatation of the main pancreatic duct. Pancreatic parenchyma enhances throughout. Spleen: No splenomegaly. No focal mass lesion. Adrenals/Urinary Tract: No adrenal nodule or mass. 3.1 cm exophytic cyst noted interpolar left kidney. Right kidney unremarkable. No evidence for hydroureter. The urinary bladder appears normal for the degree of distention. Stomach/Bowel: Stomach is unremarkable. No gastric wall thickening. No evidence of outlet obstruction. Duodenum is normally positioned as is the ligament of Treitz. No small bowel wall thickening. No small bowel dilatation. The terminal ileum is normal. The appendix measures up to 13 mm diameter with a fluid-filled lumen. CT scan yesterday showed gas diffusely in the lumen of the appendix with appendiceal diameter maximal at 8 mm. There is fluid/edema around the  gallbladder on today's study, progressive in the interval. No gross colonic mass. No colonic wall thickening. Diverticular changes are noted in the left colon without evidence of diverticulitis. Vascular/Lymphatic: Abdominal aortic aneurysm is stable in the interval measuring 5.7 cm diameter today. Insert calcis crash that There is no gastrohepatic or hepatoduodenal ligament lymphadenopathy. No intraperitoneal or retroperitoneal lymphadenopathy. No pelvic sidewall lymphadenopathy. Other: Small volume free fluid noted adjacent to the liver and spleen. There is fluid in the para colic gutters bilaterally. Retroperitoneal edema/inflammation is seen in the anterior pararenal space and tracking in the extraperitoneal tissues towards the pelvis, right greater than left.  Musculoskeletal: Small bilateral groin hernias contain only fat. Posttraumatic deformity noted right iliac bone. Compression deformity in the in the L4 vertebral body is stable. IMPRESSION: 1. Peripancreatic edema/inflammation, similar to prior. Acute pancreatitis could have this appearance. There is no dilatation of the main pancreatic duct. No findings to suggest pancreatic necrosis. 2. No focal or rim enhancing fluid collection in the abdomen/pelvis. 3. Distended gallbladder with calcified gallstones. 4. Intra and extrahepatic biliary duct dilatation with extrahepatic common duct measuring up to 13 mm. No calcified stone visualized within the extrahepatic bile ducts on today's study. MRCP or ERCP may prove helpful to further evaluate as clinically warranted. 5. Since the prior study, the appendix is become more distended and fluid-filled. Appendiceal diameter is abnormal today at 13 mm. Appendiceal lumen is diffusely air-filled on the study from 3 days ago. Features on today's exam may be secondary to irritation from the intraperitoneal fluid and adjacent extraperitoneal inflammatory change. Acute appendicitis cannot be definitively excluded by imaging on this study. 6. Small volume intraperitoneal free fluid. 7. Small bilateral groin hernias contain only fat. 8. Abdominal aortic aneurysm. Vascular surgery consultation recommended due to increased risk of rupture for AAA >5.5 cm. This recommendation follows ACR consensus guidelines: White Paper of the ACR Incidental Findings Committee II on Vascular Findings. J Am Coll Radiol 2013; 10:789-794. Aortic aneurysm NOS (ICD10-I71.9) Electronically Signed   By: Misty Stanley M.D.   On: 01/06/2019 13:42        Scheduled Meds: . dofetilide  500 mcg Oral BID  . insulin aspart  0-5 Units Subcutaneous QHS  . insulin aspart  0-9 Units Subcutaneous TID WC  . metoprolol succinate  50 mg Oral Daily  . pantoprazole (PROTONIX) IV  40 mg Intravenous Q24H  . sodium  chloride flush  10-40 mL Intracatheter Q12H   Continuous Infusions: . sodium chloride 75 mL/hr at 01/06/19 2329  . heparin 1,000 Units/hr (01/07/19 0524)  . piperacillin-tazobactam (ZOSYN)  IV 3.375 g (01/07/19 0526)          Aline August, MD [facility] 01/07/2019, 7:58 AM

## 2019-01-07 NOTE — Progress Notes (Signed)
CC: Lower abdominal pain, decreased urine output, unsteady gait  Subjective: Patient transferred from [place] on 01/03/2019 with generalized abdominal pain aching cramping and pressure.  Pain and discomfort has improved and this a.m. he is pain-free.  He is on a full liquid diet and had some ice cream last night but is not taking much.  Objective: Vital signs in last 24 hours: Temp:  [98 F (36.7 C)-98.7 F (37.1 C)] 98 F (36.7 C) (12/08 0500) Pulse Rate:  [81-87] 81 (12/08 0500) Resp:  [18-19] 18 (12/08 0500) BP: (121-125)/(79-91) 125/79 (12/08 0500) SpO2:  [95 %-97 %] 95 % (12/08 0500) Last BM Date: 01/06/19 240 p.o. recorded 1700 IV 700 urine BM x1 Afebrile vital signs are stable NA 131, potassium 3.3, lipase 31 LFTs improving CMP Latest Ref Rng & Units 01/07/2019 01/06/2019 01/05/2019  Total Bilirubin 0.3 - 1.2 mg/dL 1.9(H) 3.1(H) 2.8(H)  Alkaline Phos 38 - 126 U/L 109 144(H) 131(H)  AST 15 - 41 U/L 37 43(H) 60(H)  ALT 0 - 44 U/L 54(H) 73(H) 96(H)  PTT 62 Platelets 125,000 CT scan 01/06/2019: Peripancreatic edema/inflammation similar to prior study.  No dilatation of the main pancreatic duct; no findings suggestive of pancreatic necrosis.  Distended gallbladder with calcified stones.  Intra and extrahepatic duct dilatation CBD measuring up to 13 mm no calcified stones visualized. Distended and fluid-filled gallbladder measuring 13 mm.  Small bilateral groin hernias containing fat only.  5.7 cm abdominal aortic aneurysm Intake/Output from previous day: No intake/output data recorded. Intake/Output this shift: No intake/output data recorded.  General appearance: alert, cooperative and no distress Resp: clear to auscultation bilaterally GI: soft, non-tender; bowel sounds normal; no masses,  no organomegaly  Lab Results:  Recent Labs    01/06/19 0318 01/07/19 0319  WBC 12.4* 8.3  HGB 12.1* 10.6*  HCT 35.0* 30.5*  PLT 121* 125*    BMET Recent Labs     01/06/19 0318 01/07/19 0319  NA 132* 131*  K 3.7 3.3*  CL 97* 99  CO2 19* 22  GLUCOSE 169* 147*  BUN 16 14  CREATININE 1.07 0.79  CALCIUM 8.1* 7.9*   PT/INR No results for input(s): LABPROT, INR in the last 72 hours.  Recent Labs  Lab 01/03/19 1548 01/04/19 0500 01/05/19 0343 01/06/19 0318 01/07/19 0319  AST 224* 118* 60* 43* 37  ALT 250* 157* 96* 73* 54*  ALKPHOS 253* 190* 131* 144* 109  BILITOT 3.1* 2.5* 2.8* 3.1* 1.9*  PROT 7.5 6.2* 5.7* 6.3* 5.4*  ALBUMIN 4.2 3.3* 2.7* 2.7* 2.4*     Lipase     Component Value Date/Time   LIPASE 31 01/07/2019 0319     Medications: . dofetilide  500 mcg Oral BID  . insulin aspart  0-5 Units Subcutaneous QHS  . insulin aspart  0-9 Units Subcutaneous TID WC  . metoprolol succinate  50 mg Oral Daily  . pantoprazole (PROTONIX) IV  40 mg Intravenous Q24H  . sodium chloride flush  10-40 mL Intracatheter Q12H   . sodium chloride 75 mL/hr at 01/06/19 2329  . heparin 1,000 Units/hr (01/07/19 0524)  . piperacillin-tazobactam (ZOSYN)  IV 3.375 g (01/07/19 0526)   Anti-infectives (From admission, onward)   Start     Dose/Rate Route Frequency Ordered Stop   01/04/19 0430  piperacillin-tazobactam (ZOSYN) IVPB 3.375 g     3.375 g 12.5 mL/hr over 240 Minutes Intravenous Every 8 hours 01/04/19 0415       Assessment/Plan Atypical chest  pain Chronic systolic congestive heart failure EF 35-40%  - cardiology following: Class III risk10.1% risk of death/MI/cardiac arrest; OK        for surgery Paroxysmal atrial fibrillation -rate control focusing/Toprol AICD Chronic anticoagulation on Eliquis Right CE 11/07/18 Infrarenal abdominal aortic aneurysm 5.7 cm Hyponatremia with history of SIADH Type 2 diabetes 5.7 cm abdominal aortic aneurysm -Vascular Surgery following Hx tobacco use    Gallstone pancreatitis Probable choledocholithiasis Incidental appendicitis  FEN: IV fluids/full liquids ID: Zosyn 12/5 >> day 4 DVT:  Heparin  drip Follow up:  TBD   Plan: Await GI evaluation/ERCP.  After that we will arrange for laparoscopic cholecystectomy and possible appendectomy.     LOS: 4 days    [name] 01/07/2019 Please see Amion

## 2019-01-07 NOTE — Progress Notes (Signed)
[place] for Heparin Indication: atrial fibrillation  Allergies  Allergen Reactions  . Bee Venom Anaphylaxis  . Latex Other (See Comments)    REDNESS AT REACTION SITE.  Marland Kitchen Metformin Diarrhea  . Rivaroxaban Other (See Comments)    Headaches, blurred vision  . Sotalol Other (See Comments)    "BLUE TOES"- cut his circulation off  . Warfarin Sodium Other (See Comments)    REACTION: migraine headaches/vision impariment    Patient Measurements: Height: 6\' 1"  (185.4 cm) Weight: 204 lb 12.9 oz (92.9 kg) IBW/kg (Calculated) : 79.9  Vital Signs: Temp: 98 F (36.7 C) (12/08 0500) Temp Source: Oral (12/08 0500) BP: 125/79 (12/08 0500) Pulse Rate: 81 (12/08 0500)  Labs: Recent Labs    01/04/19 0844  01/05/19 0343  01/05/19 2300 01/06/19 0318 01/06/19 1209 01/07/19 0319  HGB  --    < > 12.0*  --   --  12.1*  --  10.6*  HCT  --   --  34.9*  --   --  35.0*  --  30.5*  PLT  --   --  107*  --   --  121*  --  125*  APTT  --    < > 158*   < > 199* 87*  --  62*  HEPARINUNFRC  --   --  1.38*  --   --  0.81*  --  0.32  CREATININE  --   --  0.98  --   --  1.07  --  0.79  TROPONINIHS 15  --   --   --   --   --  24*  --    < > = values in this interval not displayed.    Estimated Creatinine Clearance: 76.3 mL/min (by C-G formula based on SCr of 0.79 mg/dL).  Assessment: 85 y.o. male admitted with abdominal pain, h/o Afib and Eliquis on hold now on heparin  PTT low this AM  Cbc stable  Goal of Therapy:  Heparin level 0.3-0.7 units/ml  APTT 66-102 Monitor platelets by anticoagulation protocol: Yes   Plan:  Increase heparin to 1100 units / hr Daily aptt hep lvl cbc  Thank you Anette Guarneri, PharmD  Please check AMION for all [place] numbers  01/07/2019 8:19 AM

## 2019-01-08 ENCOUNTER — Inpatient Hospital Stay (HOSPITAL_COMMUNITY): Payer: Medicare Other | Admitting: Certified Registered"

## 2019-01-08 ENCOUNTER — Inpatient Hospital Stay (HOSPITAL_COMMUNITY): Payer: Medicare Other

## 2019-01-08 ENCOUNTER — Encounter (HOSPITAL_COMMUNITY)
Admission: EM | Disposition: A | Discharge status: Home Health | Payer: Self-pay | Source: Home / Self Care | Attending: Internal Medicine

## 2019-01-08 DIAGNOSIS — Z95810 Presence of automatic (implantable) cardiac defibrillator: Secondary | ICD-10-CM | POA: Diagnosis not present

## 2019-01-08 DIAGNOSIS — R9431 Abnormal electrocardiogram [ECG] [EKG]: Secondary | ICD-10-CM

## 2019-01-08 DIAGNOSIS — I714 Abdominal aortic aneurysm, without rupture: Secondary | ICD-10-CM | POA: Diagnosis not present

## 2019-01-08 HISTORY — PX: LAPAROSCOPIC APPENDECTOMY: SHX408

## 2019-01-08 HISTORY — PX: CHOLECYSTECTOMY: SHX55

## 2019-01-08 LAB — CBC WITH DIFFERENTIAL/PLATELET
Abs Immature Granulocytes: 0.03 10*3/uL (ref 0.00–0.07)
Basophils Absolute: 0 10*3/uL (ref 0.0–0.1)
Basophils Relative: 0 %
Eosinophils Absolute: 0.3 10*3/uL (ref 0.0–0.5)
Eosinophils Relative: 4 %
HCT: 30.8 % — ABNORMAL LOW (ref 39.0–52.0)
Hemoglobin: 10.6 g/dL — ABNORMAL LOW (ref 13.0–17.0)
Immature Granulocytes: 0 %
Lymphocytes Relative: 5 %
Lymphs Abs: 0.4 10*3/uL — ABNORMAL LOW (ref 0.7–4.0)
MCH: 35.5 pg — ABNORMAL HIGH (ref 26.0–34.0)
MCHC: 34.4 g/dL (ref 30.0–36.0)
MCV: 103 fL — ABNORMAL HIGH (ref 80.0–100.0)
Monocytes Absolute: 0.8 10*3/uL (ref 0.1–1.0)
Monocytes Relative: 10 %
Neutro Abs: 6.3 10*3/uL (ref 1.7–7.7)
Neutrophils Relative %: 81 %
Platelets: 131 10*3/uL — ABNORMAL LOW (ref 150–400)
RBC: 2.99 MIL/uL — ABNORMAL LOW (ref 4.22–5.81)
RDW: 13.6 % (ref 11.5–15.5)
WBC: 7.8 10*3/uL (ref 4.0–10.5)
nRBC: 0 % (ref 0.0–0.2)

## 2019-01-08 LAB — COMPREHENSIVE METABOLIC PANEL
ALT: 48 U/L — ABNORMAL HIGH (ref 0–44)
AST: 33 U/L (ref 15–41)
Albumin: 2.4 g/dL — ABNORMAL LOW (ref 3.5–5.0)
Alkaline Phosphatase: 99 U/L (ref 38–126)
Anion gap: 10 (ref 5–15)
BUN: 11 mg/dL (ref 8–23)
CO2: 22 mmol/L (ref 22–32)
Calcium: 7.9 mg/dL — ABNORMAL LOW (ref 8.9–10.3)
Chloride: 101 mmol/L (ref 98–111)
Creatinine, Ser: 0.75 mg/dL (ref 0.61–1.24)
GFR calc Af Amer: >60 mL/min (ref >60)
GFR calc non Af Amer: >60 mL/min (ref >60)
Glucose, Bld: 157 mg/dL — ABNORMAL HIGH (ref 70–99)
Potassium: 3 mmol/L — ABNORMAL LOW (ref 3.5–5.1)
Sodium: 133 mmol/L — ABNORMAL LOW (ref 135–145)
Total Bilirubin: 1.3 mg/dL — ABNORMAL HIGH (ref 0.3–1.2)
Total Protein: 5.2 g/dL — ABNORMAL LOW (ref 6.5–8.1)

## 2019-01-08 LAB — MAGNESIUM: Magnesium: 1.8 mg/dL (ref 1.7–2.4)

## 2019-01-08 LAB — VITAMIN B12: Vitamin B-12: 888 pg/mL (ref 180–914)

## 2019-01-08 LAB — GLUCOSE, CAPILLARY
Glucose-Capillary: 151 mg/dL — ABNORMAL HIGH (ref 70–99)
Glucose-Capillary: 146 mg/dL — ABNORMAL HIGH (ref 70–99)
Glucose-Capillary: 139 mg/dL — ABNORMAL HIGH (ref 70–99)
Glucose-Capillary: 149 mg/dL — ABNORMAL HIGH (ref 70–99)
Glucose-Capillary: 186 mg/dL — ABNORMAL HIGH (ref 70–99)

## 2019-01-08 LAB — SURGICAL PCR SCREEN
MRSA, PCR: NEGATIVE
Staphylococcus aureus: NEGATIVE

## 2019-01-08 LAB — TSH: TSH: 2.274 u[IU]/mL (ref 0.350–4.500)

## 2019-01-08 LAB — HEPARIN LEVEL (UNFRACTIONATED): Heparin Unfractionated: 0.14 IU/mL — ABNORMAL LOW (ref 0.30–0.70)

## 2019-01-08 LAB — FOLATE: Folate: 32.7 ng/mL (ref >5.9)

## 2019-01-08 LAB — APTT: aPTT: 79 seconds — ABNORMAL HIGH (ref 24–36)

## 2019-01-08 SURGERY — LAPAROSCOPIC CHOLECYSTECTOMY WITH INTRAOPERATIVE CHOLANGIOGRAM
Anesthesia: General | Site: Abdomen

## 2019-01-08 MED ORDER — ROCURONIUM BROMIDE 10 MG/ML (PF) SYRINGE
PREFILLED_SYRINGE | INTRAVENOUS | Status: AC
  Filled 2019-01-08: qty 10

## 2019-01-08 MED ORDER — LIDOCAINE 2% (20 MG/ML) 5 ML SYRINGE
INTRAMUSCULAR | Status: DC | PRN
  Administered 2019-01-08: 40 mg via INTRAVENOUS

## 2019-01-08 MED ORDER — POTASSIUM CHLORIDE CRYS ER 20 MEQ PO TBCR
40.0000 meq | EXTENDED_RELEASE_TABLET | Freq: Once | ORAL | Status: AC
  Administered 2019-01-08: 15:00:00 40 meq via ORAL
  Filled 2019-01-08: qty 2

## 2019-01-08 MED ORDER — 0.9 % SODIUM CHLORIDE (POUR BTL) OPTIME
TOPICAL | Status: DC | PRN
  Administered 2019-01-08: 1000 mL

## 2019-01-08 MED ORDER — ROCURONIUM BROMIDE 10 MG/ML (PF) SYRINGE
PREFILLED_SYRINGE | INTRAVENOUS | Status: DC | PRN
  Administered 2019-01-08: 50 mg via INTRAVENOUS
  Administered 2019-01-08: 20 mg via INTRAVENOUS

## 2019-01-08 MED ORDER — DOFETILIDE 250 MCG PO CAPS
250.0000 ug | ORAL_CAPSULE | Freq: Two times a day (BID) | ORAL | Status: DC
  Administered 2019-01-08 – 2019-01-14 (×13): 250 ug via ORAL
  Filled 2019-01-08 (×15): qty 1

## 2019-01-08 MED ORDER — PHENYLEPHRINE HCL-NACL 10-0.9 MG/250ML-% IV SOLN
INTRAVENOUS | Status: DC | PRN
  Administered 2019-01-08: 40 ug/min via INTRAVENOUS

## 2019-01-08 MED ORDER — SUGAMMADEX SODIUM 200 MG/2ML IV SOLN
INTRAVENOUS | Status: DC | PRN
  Administered 2019-01-08: 200 mg via INTRAVENOUS

## 2019-01-08 MED ORDER — BUPIVACAINE HCL (PF) 0.25 % IJ SOLN
INTRAMUSCULAR | Status: AC
  Filled 2019-01-08: qty 30

## 2019-01-08 MED ORDER — HEMOSTATIC AGENTS (NO CHARGE) OPTIME
TOPICAL | Status: DC | PRN
  Administered 2019-01-08 (×2): 1 via TOPICAL

## 2019-01-08 MED ORDER — SODIUM CHLORIDE 0.9 % IV SOLN
INTRAVENOUS | Status: DC | PRN
  Administered 2019-01-08: 11:00:00 40 mL

## 2019-01-08 MED ORDER — SODIUM CHLORIDE 0.9 % IR SOLN
Status: DC | PRN
  Administered 2019-01-08: 1000 mL

## 2019-01-08 MED ORDER — BUPIVACAINE HCL (PF) 0.25 % IJ SOLN
INTRAMUSCULAR | Status: DC | PRN
  Administered 2019-01-08: 10 mL

## 2019-01-08 MED ORDER — FENTANYL CITRATE (PF) 100 MCG/2ML IJ SOLN
INTRAMUSCULAR | Status: AC
  Filled 2019-01-08: qty 2

## 2019-01-08 MED ORDER — FENTANYL CITRATE (PF) 250 MCG/5ML IJ SOLN
INTRAMUSCULAR | Status: AC
  Filled 2019-01-08: qty 5

## 2019-01-08 MED ORDER — MORPHINE SULFATE (PF) 2 MG/ML IV SOLN: 1.0000 mg | INTRAVENOUS | Status: DC | PRN

## 2019-01-08 MED ORDER — OXYCODONE HCL 5 MG PO TABS
5.0000 mg | ORAL_TABLET | ORAL | Status: DC | PRN
  Administered 2019-01-08 (×2): 5 mg via ORAL
  Filled 2019-01-08 (×2): qty 1

## 2019-01-08 MED ORDER — LACTATED RINGERS IV SOLN
INTRAVENOUS | Status: DC
  Administered 2019-01-08: 09:00:00 via INTRAVENOUS

## 2019-01-08 MED ORDER — FENTANYL CITRATE (PF) 100 MCG/2ML IJ SOLN
25.0000 ug | INTRAMUSCULAR | Status: DC | PRN
  Administered 2019-01-08: 25 ug via INTRAVENOUS

## 2019-01-08 MED ORDER — STERILE WATER FOR IRRIGATION IR SOLN
Status: DC | PRN
  Administered 2019-01-08: 1000 mL

## 2019-01-08 MED ORDER — FENTANYL CITRATE (PF) 250 MCG/5ML IJ SOLN
INTRAMUSCULAR | Status: DC | PRN
  Administered 2019-01-08 (×4): 50 ug via INTRAVENOUS

## 2019-01-08 MED ORDER — LIDOCAINE 2% (20 MG/ML) 5 ML SYRINGE
INTRAMUSCULAR | Status: AC
  Filled 2019-01-08: qty 5

## 2019-01-08 MED ORDER — ONDANSETRON HCL 4 MG/2ML IJ SOLN
INTRAMUSCULAR | Status: DC | PRN
  Administered 2019-01-08: 4 mg via INTRAVENOUS

## 2019-01-08 MED ORDER — FENTANYL CITRATE (PF) 100 MCG/2ML IJ SOLN: 25.0000 ug | INTRAMUSCULAR | Status: DC | PRN

## 2019-01-08 MED ORDER — DEXAMETHASONE SODIUM PHOSPHATE 10 MG/ML IJ SOLN
INTRAMUSCULAR | Status: DC | PRN
  Administered 2019-01-08: 4 mg via INTRAVENOUS

## 2019-01-08 MED ORDER — PROPOFOL 10 MG/ML IV BOLUS
INTRAVENOUS | Status: AC
  Filled 2019-01-08: qty 20

## 2019-01-08 MED ORDER — LACTATED RINGERS IV SOLN
INTRAVENOUS | Status: DC | PRN
  Administered 2019-01-08: 11:00:00 via INTRAVENOUS

## 2019-01-08 MED ORDER — PROPOFOL 10 MG/ML IV BOLUS
INTRAVENOUS | Status: DC | PRN
  Administered 2019-01-08: 100 mg via INTRAVENOUS

## 2019-01-08 SURGICAL SUPPLY — 56 items
APL PRP STRL LF DISP 70% ISPRP (MISCELLANEOUS) ×1
APL SKNCLS STERI-STRIP NONHPOA (GAUZE/BANDAGES/DRESSINGS) ×1
APPLIER CLIP ROT 10 11.4 M/L (STAPLE) ×2
APR CLP MED LRG 11.4X10 (STAPLE) ×1
BAG SPEC RTRVL 10 TROC 200 (ENDOMECHANICALS) ×1
BAG SPEC RTRVL LRG 6X4 10 (ENDOMECHANICALS)
BENZOIN TINCTURE PRP APPL 2/3 (GAUZE/BANDAGES/DRESSINGS) ×2 IMPLANT
BLADE CLIPPER SURG (BLADE) ×1 IMPLANT
CANISTER SUCT 3000ML PPV (MISCELLANEOUS) ×2 IMPLANT
CHLORAPREP W/TINT 26 (MISCELLANEOUS) ×2 IMPLANT
CLIP APPLIE ROT 10 11.4 M/L (STAPLE) ×1 IMPLANT
CLSR STERI-STRIP ANTIMIC 1/2X4 (GAUZE/BANDAGES/DRESSINGS) ×1 IMPLANT
COVER MAYO STAND STRL (DRAPES) ×2 IMPLANT
COVER SURGICAL LIGHT HANDLE (MISCELLANEOUS) ×2 IMPLANT
COVER WAND RF STERILE (DRAPES) ×1 IMPLANT
CUTTER FLEX LINEAR 45M (STAPLE) ×2 IMPLANT
DRAPE C-ARM 42X120 X-RAY (DRAPES) ×2 IMPLANT
DRSG TEGADERM 2-3/8X2-3/4 SM (GAUZE/BANDAGES/DRESSINGS) ×2 IMPLANT
DRSG TEGADERM 4X4.75 (GAUZE/BANDAGES/DRESSINGS) ×2 IMPLANT
ELECT REM PT RETURN 9FT ADLT (ELECTROSURGICAL) ×2
ELECTRODE REM PT RTRN 9FT ADLT (ELECTROSURGICAL) ×1 IMPLANT
ENDOLOOP SUT PDS II  0 18 (SUTURE)
ENDOLOOP SUT PDS II 0 18 (SUTURE) IMPLANT
GAUZE SPONGE 2X2 8PLY STRL LF (GAUZE/BANDAGES/DRESSINGS) ×1 IMPLANT
GLOVE BIO SURGEON STRL SZ7 (GLOVE) ×2 IMPLANT
GLOVE BIOGEL PI IND STRL 7.5 (GLOVE) ×1 IMPLANT
GLOVE BIOGEL PI INDICATOR 7.5 (GLOVE) ×1
GOWN STRL REUS W/ TWL LRG LVL3 (GOWN DISPOSABLE) ×3 IMPLANT
GOWN STRL REUS W/TWL LRG LVL3 (GOWN DISPOSABLE) ×6
HEMOSTAT SNOW SURGICEL 2X4 (HEMOSTASIS) ×2 IMPLANT
KIT BASIN OR (CUSTOM PROCEDURE TRAY) ×2 IMPLANT
KIT TURNOVER KIT B (KITS) ×2 IMPLANT
NS IRRIG 1000ML POUR BTL (IV SOLUTION) ×2 IMPLANT
PAD ARMBOARD 7.5X6 YLW CONV (MISCELLANEOUS) ×4 IMPLANT
POUCH RETRIEVAL ECOSAC 10 (ENDOMECHANICALS) IMPLANT
POUCH RETRIEVAL ECOSAC 10MM (ENDOMECHANICALS) ×1
POUCH SPECIMEN RETRIEVAL 10MM (ENDOMECHANICALS) ×1 IMPLANT
RELOAD STAPLE TA45 3.5 REG BLU (ENDOMECHANICALS) ×2 IMPLANT
SCISSORS LAP 5X35 DISP (ENDOMECHANICALS) ×2 IMPLANT
SET CHOLANGIOGRAPH 5 50 .035 (SET/KITS/TRAYS/PACK) ×2 IMPLANT
SET IRRIG TUBING LAPAROSCOPIC (IRRIGATION / IRRIGATOR) ×2 IMPLANT
SET TUBE SMOKE EVAC HIGH FLOW (TUBING) ×2 IMPLANT
SHEARS HARMONIC ACE PLUS 36CM (ENDOMECHANICALS) ×2 IMPLANT
SLEEVE ENDOPATH XCEL 5M (ENDOMECHANICALS) ×2 IMPLANT
SPECIMEN JAR SMALL (MISCELLANEOUS) ×2 IMPLANT
SPONGE GAUZE 2X2 8PLY STRL LF (GAUZE/BANDAGES/DRESSINGS) ×1 IMPLANT
SPONGE GAUZE 2X2 STER 10/PKG (GAUZE/BANDAGES/DRESSINGS) ×1
STRIP CLOSURE SKIN 1/2X4 (GAUZE/BANDAGES/DRESSINGS) ×2 IMPLANT
SUT MNCRL AB 4-0 PS2 18 (SUTURE) ×2 IMPLANT
TOWEL GREEN STERILE (TOWEL DISPOSABLE) ×2 IMPLANT
TOWEL GREEN STERILE FF (TOWEL DISPOSABLE) ×2 IMPLANT
TRAY LAPAROSCOPIC MC (CUSTOM PROCEDURE TRAY) ×2 IMPLANT
TROCAR XCEL BLUNT TIP 100MML (ENDOMECHANICALS) ×2 IMPLANT
TROCAR XCEL NON-BLD 11X100MML (ENDOMECHANICALS) ×2 IMPLANT
TROCAR XCEL NON-BLD 5MMX100MML (ENDOMECHANICALS) ×2 IMPLANT
WATER STERILE IRR 1000ML POUR (IV SOLUTION) ×2 IMPLANT

## 2019-01-08 NOTE — Anesthesia Preprocedure Evaluation (Addendum)
Anesthesia Evaluation  Patient identified by MRN, date of birth, ID band Patient awake    Reviewed: Allergy & Precautions, NPO status , Patient's Chart, lab work & pertinent test results  Airway Mallampati: II  TM Distance: >3 FB Neck ROM: Full    Dental no notable dental hx. (+) Chipped, Poor Dentition, Dental Advisory Given,    Pulmonary asthma , COPD, former smoker,  Former smoker 32 pack years, quit 1973   Pulmonary exam normal breath sounds clear to auscultation       Cardiovascular + angina + CAD and +CHF  Normal cardiovascular exam+ dysrhythmias Atrial Fibrillation + Cardiac Defibrillator  Rhythm:Regular Rate:Normal  CAD s/p CABG, mitral valve repair, polymorphic VT s/p ICD Med[employer]onic dual chamber, PFO closure, Persistent afib on Eliquis, Chronic systolic and diastolic HF (EF [id number]), HTN, DM, SIADH, carotid artery disease s/p R CEA 10/2018 and HLD   Seen by cardiology for clearance, considered high risk   Neuro/Psych  Headaches, TIAnegative psych ROS   GI/Hepatic Neg liver ROS, gallstone pancretitis, possible appendicitis. Unable to have MRCP due to AICD in place   Endo/Other  diabetes, Type 2, Insulin Dependent  Renal/GU negative Renal ROS  negative genitourinary   Musculoskeletal negative musculoskeletal ROS (+)   Abdominal (+) + obese,   Peds  Hematology negative hematology ROS (+)   Anesthesia Other Findings HLD  Reproductive/Obstetrics negative OB ROS                           Anesthesia Physical Anesthesia Plan  ASA: IV  Anesthesia Plan: General   Post-op Pain Management:    Induction: Intravenous  PONV Risk Score and Plan: 2 and Ondansetron, Dexamethasone and Treatment may vary due to age or medical condition  Airway Management Planned: Oral ETT  Additional Equipment: Arterial line  Intra-op Plan:   Post-operative Plan: Extubation in OR  Informed Consent: I  have reviewed the patients History and Physical, chart, labs and discussed the procedure including the risks, benefits and alternatives for the proposed anesthesia with the patient or authorized representative who has indicated his/her understanding and acceptance.     Dental advisory given  Plan Discussed with: CRNA  Anesthesia Plan Comments:         Anesthesia Quick Evaluation

## 2019-01-08 NOTE — Progress Notes (Signed)
Pt returned to room 6N29 via bed after surgery. Received report from Arnette Norris, RN in PACU. See reasessment. Will continue to monitor.

## 2019-01-08 NOTE — Transfer of Care (Signed)
Immediate Anesthesia Transfer of Care Note  Patient: [name]  Procedure(s) Performed: LAPAROSCOPIC CHOLECYSTECTOMY WITH INTRAOPERATIVE CHOLANGIOGRAM (N/A Abdomen) APPENDECTOMY LAPAROSCOPIC (N/A Abdomen)  Patient Location: PACU  Anesthesia Type:General  Level of Consciousness: sedated  Airway & Oxygen Therapy: Patient Spontanous Breathing and Patient connected to face mask oxygen  Post-op Assessment: Report given to RN and Post -op Vital signs reviewed and stable  Post vital signs: Reviewed and stable  Last Vitals:  Vitals Value Taken Time  BP 143/84 01/08/19 1250  Temp    Pulse 106 01/08/19 1251  Resp 21 01/08/19 1251  SpO2 97 % 01/08/19 1251  Vitals shown include unvalidated device data.  Last Pain:  Vitals:   01/08/19 0818  TempSrc:   PainSc: 0-No pain      Patients Stated Pain Goal: 2 ([id number])  Complications: No apparent anesthesia complications

## 2019-01-08 NOTE — Progress Notes (Addendum)
Patient ID: [name], male   DOB: Oct 13, 1931, 85 y.o.   MRN: [id number]  PROGRESS NOTE    [name] BUCKERT  [id number] DOB: 26-Feb-1931 DOA: 01/03/2019 PCP: [name], MD   Brief Narrative:  85 year old male with history of CAD status post CABG, chronic systolic heart failure status post AICD placement, A. fib on apixaban and [name] and recent reaction to amiodarone, recent carotid endarterectomy, recent admission for CHF in October 2020 presented on 01/03/2019 with abdominal pain.  He was found to have elevated LFTs and bilirubin along with elevated lipase.  CT of the abdomen and pelvis showed features concerning for acute pancreatitis with dilated CBD and gallstones with ductal enhancement concerning for infection.  He was started on IV fluids and antibiotics.  GI was consulted.  Vascular surgery was consulted for infrarenal abdominal aortic aneurysm. -12/9: Underwent lap chole with IOC   Assessment & Plan:   Acute biliary pancreatitis Cholelithiasis with dilated CBD -CT on admission showed features concerning for acute pancreatitis with dilated CBD and gallstones with ductal enhancement concerning for infection -Managed with supportive care, bowel rest, IV fluids etc. -Gastroenterology and general surgery following, remains on Zosyn day 4 -Repeat CT abdomen pelvis 12/7 showed acute pancreatitis without necrosis, distended gallbladder with stones and CBD dilation up to 13 mm with question of appendicitis as well -General surgery following, just underwent lap chole with IOC,, noted to have a questionable filling defect in the distal CBD -Gastroenterology following -Plan to monitor LFTs and decide if ERCP is indicated  Infrarenal abdominal aortic aneurysm measuring 5.7 cm with no dissection -Patient will need elective vascular surgery intervention once he has resolved from gallstone pancreatitis.  Vascular surgery consultation appreciated.  Paroxysmal A. fib -Currently rate  controlled.  Eliquis on hold.   -Resume IV heparin tomorrow if okay with surgery -Continue [name] and metoprolol.  Hyponatremia  - history of recent diagnosis of SIADH -Mild, improved and stable  Hypokalemia -Replaced  Chronic systolic heart failure with history of AICD -Last EF of 35 to 40% in October 2020. -Clinically appears euvolemic, caution with aggressive hydration -Cardiology evaluation appreciated.  Diabetes mellitus type 2 -CBGs are stable, continue SSI  Macrocytic anemia -B12 folate and TSH are unremarkable, follow-up with PCP  Generalized deconditioning -Physical therapy following, home health recommended  DVT prophylaxis: Heparin Code Status: Full Family Communication: Discussed with patient  disposition Plan: Depends on clinical outcome  Consultants: Gastroenterology.  Vascular surgery.  Cardiology Procedures: None Antimicrobials: Zosyn from 01/03/2019 onwards   Subjective: -Seen in PACU, just underwent lap chole with IOC, arousable but drowsy, per PACU RN, no events postop  Objective: Vitals:   01/08/19 1415 01/08/19 1419 01/08/19 1430 01/08/19 1504  BP:  128/66  130/67  Pulse: 89 88 97 87  Resp: 19 (!) 21 (!) 23 20  Temp: 97.7 F (36.5 C)   97.9 F (36.6 C)  TempSrc:    Oral  SpO2: 98% 97% 96% 95%  Weight:      Height:        Intake/Output Summary (Last 24 hours) at 01/08/2019 1529 Last data filed at 01/08/2019 1238 Gross per 24 hour  Intake 800 ml  Output 50 ml  Net 750 ml   Filed Weights   01/04/19 0242 01/05/19 0500 01/06/19 0500  Weight: 92.9 kg 92.9 kg 92.9 kg    Examination:  General exam: Somnolent elderly male arousable, no distress Respiratory system: Poor air movement bilaterally only auscultated anteriorly cardiovascular system: S1-S2, regular rate rhythm  Gastrointestinal system: Abdomen is soft, mildly tender as expected, slightly distended, bowel sounds decreased Extremities: Trace edema Central nervous system:  Somnolent after anesthesia Skin: No ulcers or rashes. Psychiatry: Unable to assess at this time    Data Reviewed: I have personally reviewed following labs and imaging studies  CBC: Recent Labs  Lab 01/03/19 1548 01/04/19 0500 01/05/19 0343 01/06/19 0318 01/07/19 0319 01/08/19 0404  WBC 9.0 11.8* 12.7* 12.4* 8.3 7.8  NEUTROABS 7.8* 10.3*  --   --   --  6.3  HGB 14.4 12.7* 12.0* 12.1* 10.6* 10.6*  HCT 43.6 37.6* 34.9* 35.0* 30.5* 30.8*  MCV 103.3* 102.5* 101.7* 100.6* 101.0* 103.0*  PLT 138* 123* 107* 121* 125* [id number]*   Basic Metabolic Panel: Recent Labs  Lab 01/04/19 0500 01/05/19 0343 01/06/19 0318 01/07/19 0319 01/08/19 0404  NA 131* 129* 132* 131* 133*  K 4.6 4.2 3.7 3.3* 3.0*  CL 99 97* 97* 99 101  CO2 24 24 19* 22 22  GLUCOSE 133* 189* 169* 147* 157*  BUN 13 18 16 14 11   CREATININE 0.92 0.98 1.07 0.79 0.75  CALCIUM 8.4* 8.0* 8.1* 7.9* 7.9*  MG 1.9 1.7 2.1 1.9 1.8   GFR: Estimated Creatinine Clearance: 76.3 mL/min (by C-G formula based on SCr of 0.75 mg/dL). Liver Function Tests: Recent Labs  Lab 01/04/19 0500 01/05/19 0343 01/06/19 0318 01/07/19 0319 01/08/19 0404  AST 118* 60* 43* 37 33  ALT 157* 96* 73* 54* 48*  ALKPHOS 190* 131* 144* 109 99  BILITOT 2.5* 2.8* 3.1* 1.9* 1.3*  PROT 6.2* 5.7* 6.3* 5.4* 5.2*  ALBUMIN 3.3* 2.7* 2.7* 2.4* 2.4*   Recent Labs  Lab 01/03/19 1548 01/04/19 0500 01/07/19 0319  LIPASE 2,777* 960* 31   No results for input(s): AMMONIA in the last 168 hours. Coagulation Profile: Recent Labs  Lab 01/04/19 0500  INR 1.3*   Cardiac Enzymes: No results for input(s): CKTOTAL, CKMB, CKMBINDEX, TROPONINI in the last 168 hours. BNP (last 3 results) No results for input(s): PROBNP in the last 8760 hours. HbA1C: No results for input(s): HGBA1C in the last 72 hours. CBG: Recent Labs  Lab 01/07/19 1704 01/07/19 2220 01/08/19 0803 01/08/19 1301 01/08/19 1503  GLUCAP 144* 125* 151* 146* 139*   Lipid Profile: No  results for input(s): CHOL, HDL, LDLCALC, TRIG, CHOLHDL, LDLDIRECT in the last 72 hours. Thyroid Function Tests: Recent Labs    01/08/19 0404  TSH 2.274   Anemia Panel: Recent Labs    01/08/19 0404  VITAMINB12 888  FOLATE 32.7   Sepsis Labs: No results for input(s): PROCALCITON, LATICACIDVEN in the last 168 hours.  Recent Results (from the past 240 hour(s))  Urine culture     Status: Abnormal   Collection Time: 01/03/19  2:17 PM   Specimen: Urine, Random  Result Value Ref Range Status   Specimen Description   Final    URINE, RANDOM Performed at [facility], [address] [place], [place] [address]    Special Requests   Final    NONE Performed at [facility], [address] [place], [place] [address]    Culture 20,000 COLONIES/mL ENTEROCOCCUS FAECALIS (A)  Final   Report Status 01/06/2019 FINAL  Final   Organism ID, Bacteria ENTEROCOCCUS FAECALIS (A)  Final      Susceptibility   Enterococcus faecalis - MIC*    AMPICILLIN <=2 SENSITIVE Sensitive     LEVOFLOXACIN 1 SENSITIVE Sensitive     NITROFURANTOIN 32 SENSITIVE  Sensitive     VANCOMYCIN 1 SENSITIVE Sensitive     * 20,000 COLONIES/mL ENTEROCOCCUS FAECALIS  SARS CORONAVIRUS 2 (TAT 6-24 HRS) Nasopharyngeal Nasopharyngeal Swab     Status: None   Collection Time: 01/03/19  8:36 PM   Specimen: Nasopharyngeal Swab  Result Value Ref Range Status   SARS Coronavirus 2 NEGATIVE NEGATIVE Final    Comment: (NOTE) SARS-CoV-2 target nucleic acids are NOT DETECTED. The SARS-CoV-2 RNA is generally detectable in upper and lower respiratory specimens during the acute phase of infection. Negative results do not preclude SARS-CoV-2 infection, do not rule out co-infections with other pathogens, and should not be used as the sole basis for treatment or other patient management decisions. Negative results must be combined with clinical observations, patient history, and epidemiological information. The  expected result is Negative. Fact Sheet for Patients: SugarRoll.be Fact Sheet for Healthcare Providers: https://www.woods-mathews.com/ This test is not yet approved or cleared by the [place] FDA and  has been authorized for detection and/or diagnosis of SARS-CoV-2 by FDA under an Emergency Use Authorization (EUA). This EUA will remain  in effect (meaning this test can be used) for the duration of the COVID-19 declaration under Section 56 4(b)(1) of the Act, 21 U.S.C. section 360bbb-3(b)(1), unless the authorization is terminated or revoked sooner. Performed at [facility], [place] [address] [place], [place] [address]   C difficile quick scan w PCR reflex     Status: None   Collection Time: 01/07/19  5:56 PM   Specimen: STOOL  Result Value Ref Range Status   C Diff antigen NEGATIVE NEGATIVE Final   C Diff toxin NEGATIVE NEGATIVE Final   C Diff interpretation No C. difficile detected.  Final    Comment: Performed at [facility], [place] [address] [place], [place] [address]  Surgical PCR screen     Status: None   Collection Time: 01/08/19  6:51 AM   Specimen: Nasal Mucosa; Nasal Swab  Result Value Ref Range Status   MRSA, PCR NEGATIVE NEGATIVE Final   Staphylococcus aureus NEGATIVE NEGATIVE Final    Comment: (NOTE) The Xpert SA Assay (FDA approved for NASAL specimens in patients 74 years of age and older), is one component of a comprehensive surveillance program. It is not intended to diagnose infection nor to guide or monitor treatment. Performed at [facility], [place] [address] [place], [place] [address]          Radiology Studies: Dg Cholangiogram Operative  Result Date: 01/08/2019 CLINICAL DATA:  Intraoperative cholangiogram during laparoscopic cholecystectomy. EXAM: INTRAOPERATIVE CHOLANGIOGRAM FLUOROSCOPY TIME:  22 seconds COMPARISON:  CT abdomen pelvis - 01/06/2019 FINDINGS: Intraoperative  cholangiographic images of the right upper abdominal quadrant during laparoscopic cholecystectomy are provided for review. Surgical clips overlie the expected location of the gallbladder fossa. Contrast injection demonstrates selective cannulation of the central aspect of the cystic duct. There is passage of contrast through the central aspect of the cystic duct with filling of a moderately dilated common bile duct. There is passage of contrast though the CBD and into the descending portion of the duodenum. There is minimal reflux of injected contrast into the common hepatic duct and central aspect of the non dilated intrahepatic biliary system. There is a persistent nonocclusive filling defect in the distal aspect of the CBD worrisome for nonocclusive choledocholithiasis. IMPRESSION: Suspected nonocclusive choledocholithiasis within the distal aspect of the CBD. Further evaluation and management with ERCP could be performed as indicated. Electronically Signed  By: Sandi Mariscal M.D.   On: 01/08/2019 12:33        Scheduled Meds: . arformoterol  15 mcg Nebulization BID  . dofetilide  250 mcg Oral BID  . fentaNYL      . insulin aspart  0-5 Units Subcutaneous QHS  . insulin aspart  0-9 Units Subcutaneous TID WC  . metoprolol succinate  50 mg Oral Daily  . pantoprazole (PROTONIX) IV  40 mg Intravenous Q24H  . sodium chloride flush  10-40 mL Intracatheter Q12H  . umeclidinium bromide  1 puff Inhalation Daily   Continuous Infusions: . sodium chloride 50 mL/hr at 01/08/19 1519  . lactated ringers 10 mL/hr at 01/08/19 0925  . piperacillin-tazobactam (ZOSYN)  IV 3.375 g (01/08/19 1521)    [name], MD [facility] 01/08/2019, 3:29 PM

## 2019-01-08 NOTE — Anesthesia Procedure Notes (Signed)
Procedure Name: Intubation Date/Time: 01/08/2019 10:48 AM Performed by: Amadeo Garnet, CRNA Pre-anesthesia Checklist: Emergency Drugs available, Patient identified, Suction available and Patient being monitored Patient Re-evaluated:Patient Re-evaluated prior to induction Oxygen Delivery Method: Circle system utilized Preoxygenation: Pre-oxygenation with 100% oxygen Induction Type: IV induction Ventilation: Mask ventilation without difficulty and Oral airway inserted - appropriate to patient size Laryngoscope Size: Mac and 3 Grade View: Grade II Tube type: Oral Tube size: 7.5 mm Number of attempts: 1 Airway Equipment and Method: Stylet Placement Confirmation: ETT inserted through vocal cords under direct vision,  positive ETCO2 and breath sounds checked- equal and bilateral Secured at: 22 cm Tube secured with: Tape Dental Injury: Teeth and Oropharynx as per pre-operative assessment

## 2019-01-08 NOTE — Progress Notes (Signed)
Day of Surgery   Subjective/Chief Complaint: Patient comfortable - no abdominal pain   Objective: Vital signs in last 24 hours: Temp:  [98.2 F (36.8 C)-98.8 F (37.1 C)] 98.2 F (36.8 C) (12/09 0457) Pulse Rate:  [76-94] 76 (12/09 0854) Resp:  [16-18] 18 (12/09 0854) BP: (124-134)/(68-94) 134/76 (12/09 0457) SpO2:  [96 %-98 %] 96 % (12/09 0857) Last BM Date: 01/08/19  Intake/Output from previous day: No intake/output data recorded. Intake/Output this shift: No intake/output data recorded.  General appearance: alert, cooperative and no distress GI: soft, non-tender; bowel sounds normal; no masses,  no organomegaly  Lab Results:  Recent Labs    01/07/19 0319 01/08/19 0404  WBC 8.3 7.8  HGB 10.6* 10.6*  HCT 30.5* 30.8*  PLT 125* 131*   BMET Recent Labs    01/07/19 0319 01/08/19 0404  NA 131* 133*  K 3.3* 3.0*  CL 99 101  CO2 22 22  GLUCOSE 147* 157*  BUN 14 11  CREATININE 0.79 0.75  CALCIUM 7.9* 7.9*   Hepatic Function Latest Ref Rng & Units 01/08/2019 01/07/2019 01/06/2019  Total Protein 6.5 - 8.1 g/dL 5.2(L) 5.4(L) 6.3(L)  Albumin 3.5 - 5.0 g/dL 2.4(L) 2.4(L) 2.7(L)  AST 15 - 41 U/L 33 37 43(H)  ALT 0 - 44 U/L 48(H) 54(H) 73(H)  Alk Phosphatase 38 - 126 U/L 99 109 144(H)  Total Bilirubin 0.3 - 1.2 mg/dL 1.3(H) 1.9(H) 3.1(H)  Bilirubin, Direct 0.0 - 0.2 mg/dL - - -    PT/INR No results for input(s): LABPROT, INR in the last 72 hours. ABG No results for input(s): PHART, HCO3 in the last 72 hours.  Invalid input(s): PCO2, PO2  Studies/Results: Ct Abdomen W Contrast  Result Date: 01/06/2019 CLINICAL DATA:  Pancreatitis. EXAM: CT ABDOMEN WITH CONTRAST TECHNIQUE: Multidetector CT imaging of the abdomen was performed using the standard protocol following bolus administration of intravenous contrast. CONTRAST:  15m OMNIPAQUE IOHEXOL 300 MG/ML  SOLN COMPARISON:  CTA 01/03/2019 FINDINGS: Lower chest: The heart is enlarged. No substantial pericardial  effusion. Small bilateral pleural effusions evident. Hepatobiliary: No suspicious focal abnormality within the liver parenchyma. Gallbladder is distended with tiny layering gallstones evident, measuring up to 4 mm diameter. Mild intrahepatic biliary duct dilatation associated with extrahepatic common duct measuring 13 mm diameter. Common bile duct just proximal to the ampulla is 12 mm diameter. No calcified stone discernible within the extrahepatic biliary system. Pancreas: Pancreatic parenchyma is ill-defined with peripancreatic edema noted diffusely but most prominent in the region of the pancreatic head. No dilatation of the main pancreatic duct. Pancreatic parenchyma enhances throughout. Spleen: No splenomegaly. No focal mass lesion. Adrenals/Urinary Tract: No adrenal nodule or mass. 3.1 cm exophytic cyst noted interpolar left kidney. Right kidney unremarkable. No evidence for hydroureter. The urinary bladder appears normal for the degree of distention. Stomach/Bowel: Stomach is unremarkable. No gastric wall thickening. No evidence of outlet obstruction. Duodenum is normally positioned as is the ligament of Treitz. No small bowel wall thickening. No small bowel dilatation. The terminal ileum is normal. The appendix measures up to 13 mm diameter with a fluid-filled lumen. CT scan yesterday showed gas diffusely in the lumen of the appendix with appendiceal diameter maximal at 8 mm. There is fluid/edema around the gallbladder on today's study, progressive in the interval. No gross colonic mass. No colonic wall thickening. Diverticular changes are noted in the left colon without evidence of diverticulitis. Vascular/Lymphatic: Abdominal aortic aneurysm is stable in the interval measuring 5.7 cm diameter today. Insert calcis  crash that There is no gastrohepatic or hepatoduodenal ligament lymphadenopathy. No intraperitoneal or retroperitoneal lymphadenopathy. No pelvic sidewall lymphadenopathy. Other: Small volume free  fluid noted adjacent to the liver and spleen. There is fluid in the para colic gutters bilaterally. Retroperitoneal edema/inflammation is seen in the anterior pararenal space and tracking in the extraperitoneal tissues towards the pelvis, right greater than left. Musculoskeletal: Small bilateral groin hernias contain only fat. Posttraumatic deformity noted right iliac bone. Compression deformity in the in the L4 vertebral body is stable. IMPRESSION: 1. Peripancreatic edema/inflammation, similar to prior. Acute pancreatitis could have this appearance. There is no dilatation of the main pancreatic duct. No findings to suggest pancreatic necrosis. 2. No focal or rim enhancing fluid collection in the abdomen/pelvis. 3. Distended gallbladder with calcified gallstones. 4. Intra and extrahepatic biliary duct dilatation with extrahepatic common duct measuring up to 13 mm. No calcified stone visualized within the extrahepatic bile ducts on today's study. MRCP or ERCP may prove helpful to further evaluate as clinically warranted. 5. Since the prior study, the appendix is become more distended and fluid-filled. Appendiceal diameter is abnormal today at 13 mm. Appendiceal lumen is diffusely air-filled on the study from 3 days ago. Features on today's exam may be secondary to irritation from the intraperitoneal fluid and adjacent extraperitoneal inflammatory change. Acute appendicitis cannot be definitively excluded by imaging on this study. 6. Small volume intraperitoneal free fluid. 7. Small bilateral groin hernias contain only fat. 8. Abdominal aortic aneurysm. Vascular surgery consultation recommended due to increased risk of rupture for AAA >5.5 cm. This recommendation follows ACR consensus guidelines: White Paper of the ACR Incidental Findings Committee II on Vascular Findings. J Am Coll Radiol 2013; 10:789-794. Aortic aneurysm NOS (ICD10-I71.9) Electronically Signed   By: Misty Stanley M.D.   On: 01/06/2019 13:42     Anti-infectives: Anti-infectives (From admission, onward)   Start     Dose/Rate Route Frequency Ordered Stop   01/04/19 0430  [MAR Hold]  piperacillin-tazobactam (ZOSYN) IVPB 3.375 g     (MAR Hold since Wed 01/08/2019 at 0911.Hold Reason: Transfer to a Procedural area.)   3.375 g 12.5 mL/hr over 240 Minutes Intravenous Every 8 hours 01/04/19 0415        Assessment/Plan: Gallstone pancreatitis - choledocholithiasis with improving liver functions Incidental thickening of appendix  Lap chole with IOC/ lap appendectomy today.  The surgical procedure has been discussed with the patient.  Potential risks, benefits, alternative treatments, and expected outcomes have been explained.  All of the patient's questions at this time have been answered.  The likelihood of reaching the patient's treatment goal is good.  The patient understand the proposed surgical procedure and wishes to proceed.     LOS: 5 days    [name] 01/08/2019

## 2019-01-08 NOTE — Plan of Care (Signed)
  Problem: Education: Goal: Knowledge of General Education information will improve Description: Including pain rating scale, medication(s)/side effects and non-pharmacologic comfort measures Outcome: Progressing   Problem: Health Behavior/Discharge Planning: Goal: Ability to manage health-related needs will improve Outcome: Progressing   Problem: Clinical Measurements: Goal: Ability to maintain clinical measurements within normal limits will improve Outcome: Progressing Goal: Respiratory complications will improve Outcome: Progressing Goal: Cardiovascular complication will be avoided Outcome: Progressing   Problem: Activity: Goal: Risk for activity intolerance will decrease Outcome: Progressing   Problem: Elimination: Goal: Will not experience complications related to bowel motility Outcome: Progressing Goal: Will not experience complications related to urinary retention Outcome: Progressing   Problem: Pain Managment: Goal: General experience of comfort will improve Outcome: Progressing   Problem: Safety: Goal: Ability to remain free from injury will improve Outcome: Progressing   Problem: Skin Integrity: Goal: Risk for impaired skin integrity will decrease Outcome: Progressing

## 2019-01-08 NOTE — Progress Notes (Signed)
PT Cancellation Note  Patient Details Name: [name] MRN: [id number] DOB: [birth year]   Cancelled Treatment:    Reason Eval/Treat Not Completed: Patient at procedure or test/unavailable(Pt in surgery for lap appendectomy and ?ERCP. ) Will check back tomorrow. Thanks.   Godfrey Pick Shaiden Aldous 01/08/2019, 9:24 AM [name] W,PT Acute Rehabilitation Services Pager:  [phone]  Office:  [phone]

## 2019-01-08 NOTE — Progress Notes (Signed)
Progress Note  Patient Name: [name] Date of Encounter: 01/08/2019  Primary Cardiologist: Sherren Mocha, MD   Subjective   Plan for surgery today. EKG yesterday pm showed Qtc 614 ms. AM EKG pending.  Inpatient Medications    Scheduled Meds:  arformoterol  15 mcg Nebulization BID   insulin aspart  0-5 Units Subcutaneous QHS   insulin aspart  0-9 Units Subcutaneous TID WC   metoprolol succinate  50 mg Oral Daily   pantoprazole (PROTONIX) IV  40 mg Intravenous Q24H   sodium chloride flush  10-40 mL Intracatheter Q12H   umeclidinium bromide  1 puff Inhalation Daily   Continuous Infusions:  sodium chloride 50 mL/hr at 01/07/19 1319   piperacillin-tazobactam (ZOSYN)  IV 3.375 g (01/08/19 0509)   PRN Meds: acetaminophen **OR** acetaminophen, alum & mag hydroxide-simeth, ibuprofen, ipratropium-albuterol, metoCLOPramide (REGLAN) injection, morphine injection, sodium chloride flush   Vital Signs    Vitals:   01/07/19 1640 01/07/19 2109 01/07/19 2217 01/08/19 0457  BP: (!) 127/94  124/68 134/76  Pulse: 84  89 94  Resp:   16 18  Temp: 98.2 F (36.8 C)  98.8 F (37.1 C) 98.2 F (36.8 C)  TempSrc: Oral  Oral Oral  SpO2: 97% 98% 98% 96%  Weight:      Height:       No intake or output data in the 24 hours ending 01/08/19 0733 Last 3 Weights 01/06/2019 01/05/2019 01/04/2019  Weight (lbs) 204 lb 12.9 oz 204 lb 12.9 oz 204 lb 12.9 oz  Weight (kg) 92.9 kg 92.9 kg 92.9 kg      Telemetry    N/A - Personally Reviewed  ECG    01/07/19 PM ventricular/atrial paced rhythym; 97 bpm, PVCs; Qtc 614 ms - Personally Reviewed  Physical Exam   GEN: No acute distress.   Neck: No JVD Cardiac: RRR, no murmurs, rubs, or gallops.  Respiratory: Clear to auscultation bilaterally. GI: Soft, nontender, non-distended  MS: No edema; No deformity. Neuro:  Nonfocal  Psych: Normal affect   Labs    High Sensitivity Troponin:   Recent Labs  Lab 01/03/19 1548 01/03/19 1755  01/04/19 0500 01/04/19 0844 01/06/19 1209  TROPONINIHS 10 11 20* 15 24*      Chemistry Recent Labs  Lab 01/06/19 0318 01/07/19 0319 01/08/19 0404  NA 132* 131* 133*  K 3.7 3.3* 3.0*  CL 97* 99 101  CO2 19* 22 22  GLUCOSE 169* 147* 157*  BUN 16 14 11   CREATININE 1.07 0.79 0.75  CALCIUM 8.1* 7.9* 7.9*  PROT 6.3* 5.4* 5.2*  ALBUMIN 2.7* 2.4* 2.4*  AST 43* 37 33  ALT 73* 54* 48*  ALKPHOS 144* 109 99  BILITOT 3.1* 1.9* 1.3*  GFRNONAA >60 >60 >60  GFRAA >60 >60 >60  ANIONGAP 16* 10 10     Hematology Recent Labs  Lab 01/06/19 0318 01/07/19 0319 01/08/19 0404  WBC 12.4* 8.3 7.8  RBC 3.48* 3.02* 2.99*  HGB 12.1* 10.6* 10.6*  HCT 35.0* 30.5* 30.8*  MCV 100.6* 101.0* 103.0*  MCH 34.8* 35.1* 35.5*  MCHC 34.6 34.8 34.4  RDW 13.6 13.4 13.6  PLT 121* 125* 131*    BNP Recent Labs  Lab 01/03/19 1548  BNP 295.0*     DDimer No results for input(s): DDIMER in the last 168 hours.   Radiology    Ct Abdomen W Contrast  Result Date: 01/06/2019 CLINICAL DATA:  Pancreatitis. EXAM: CT ABDOMEN WITH CONTRAST TECHNIQUE: Multidetector CT imaging of the  abdomen was performed using the standard protocol following bolus administration of intravenous contrast. CONTRAST:  118mL OMNIPAQUE IOHEXOL 300 MG/ML  SOLN COMPARISON:  CTA 01/03/2019 FINDINGS: Lower chest: The heart is enlarged. No substantial pericardial effusion. Small bilateral pleural effusions evident. Hepatobiliary: No suspicious focal abnormality within the liver parenchyma. Gallbladder is distended with tiny layering gallstones evident, measuring up to 4 mm diameter. Mild intrahepatic biliary duct dilatation associated with extrahepatic common duct measuring 13 mm diameter. Common bile duct just proximal to the ampulla is 12 mm diameter. No calcified stone discernible within the extrahepatic biliary system. Pancreas: Pancreatic parenchyma is ill-defined with peripancreatic edema noted diffusely but most prominent in the region  of the pancreatic head. No dilatation of the main pancreatic duct. Pancreatic parenchyma enhances throughout. Spleen: No splenomegaly. No focal mass lesion. Adrenals/Urinary Tract: No adrenal nodule or mass. 3.1 cm exophytic cyst noted interpolar left kidney. Right kidney unremarkable. No evidence for hydroureter. The urinary bladder appears normal for the degree of distention. Stomach/Bowel: Stomach is unremarkable. No gastric wall thickening. No evidence of outlet obstruction. Duodenum is normally positioned as is the ligament of Treitz. No small bowel wall thickening. No small bowel dilatation. The terminal ileum is normal. The appendix measures up to 13 mm diameter with a fluid-filled lumen. CT scan yesterday showed gas diffusely in the lumen of the appendix with appendiceal diameter maximal at 8 mm. There is fluid/edema around the gallbladder on today's study, progressive in the interval. No gross colonic mass. No colonic wall thickening. Diverticular changes are noted in the left colon without evidence of diverticulitis. Vascular/Lymphatic: Abdominal aortic aneurysm is stable in the interval measuring 5.7 cm diameter today. Insert calcis crash that There is no gastrohepatic or hepatoduodenal ligament lymphadenopathy. No intraperitoneal or retroperitoneal lymphadenopathy. No pelvic sidewall lymphadenopathy. Other: Small volume free fluid noted adjacent to the liver and spleen. There is fluid in the para colic gutters bilaterally. Retroperitoneal edema/inflammation is seen in the anterior pararenal space and tracking in the extraperitoneal tissues towards the pelvis, right greater than left. Musculoskeletal: Small bilateral groin hernias contain only fat. Posttraumatic deformity noted right iliac bone. Compression deformity in the in the L4 vertebral body is stable. IMPRESSION: 1. Peripancreatic edema/inflammation, similar to prior. Acute pancreatitis could have this appearance. There is no dilatation of the  main pancreatic duct. No findings to suggest pancreatic necrosis. 2. No focal or rim enhancing fluid collection in the abdomen/pelvis. 3. Distended gallbladder with calcified gallstones. 4. Intra and extrahepatic biliary duct dilatation with extrahepatic common duct measuring up to 13 mm. No calcified stone visualized within the extrahepatic bile ducts on today's study. MRCP or ERCP may prove helpful to further evaluate as clinically warranted. 5. Since the prior study, the appendix is become more distended and fluid-filled. Appendiceal diameter is abnormal today at 13 mm. Appendiceal lumen is diffusely air-filled on the study from 3 days ago. Features on today's exam may be secondary to irritation from the intraperitoneal fluid and adjacent extraperitoneal inflammatory change. Acute appendicitis cannot be definitively excluded by imaging on this study. 6. Small volume intraperitoneal free fluid. 7. Small bilateral groin hernias contain only fat. 8. Abdominal aortic aneurysm. Vascular surgery consultation recommended due to increased risk of rupture for AAA >5.5 cm. This recommendation follows ACR consensus guidelines: White Paper of the ACR Incidental Findings Committee II on Vascular Findings. J Am Coll Radiol 2013; 10:789-794. Aortic aneurysm NOS (ICD10-I71.9) Electronically Signed   By: Misty Stanley M.D.   On: 01/06/2019 13:42  Cardiac Studies   Echo 11/05/18 1. Left ventricular ejection fraction, by visual estimation, is 35 to 40%. The left ventricle has moderately decreased function. There is global left ventricular hypokinesis and marked apical-basal and septal-lateral dyssynchrony. There appears to be  apical akinesis. 2. Abnormal septal motion consistent with RV pacemaker. 3. Global right ventricle has moderately reduced systolic function.The right ventricular size is normal. No increase in right ventricular wall thickness. 4. Left atrial size was severely dilated. 5. Right atrial size was  moderately dilated. 6. Mild mitral valve regurgitation. Mild-moderate mitral stenosis. Peak and mean gradients are 16 and 5 mm Hg, respectively, at 70 bpm. Estimated valve area is 1.4 cm sq by the PHT method. 7. Moderate calcification of the mitral valve leaflet(s). 8. Severe mitral annular calcification. 9. Severe thickening of the mitral valve leaflet(s). 10. The tricuspid valve is normal in structure. Tricuspid valve regurgitation is mild-moderate. 11. The aortic valve is normal in structure. Aortic valve regurgitation was not visualized by color flow Doppler. 12. The pulmonic valve was normal in structure. Pulmonic valve regurgitation is mild by color flow Doppler. 13. Mildly elevated pulmonary artery systolic pressure. 14. A pacer wire is visualized. 15. The inferior vena cava is dilated in size with >50% respiratory variability, suggesting right atrial pressure of 8 mmHg. 16. Left ventricular diastolic function could not be evaluated due to atrial fibrillation and mitral stenosis. 76. Compared to February 2020, mitral valve gradients are worse, probably due to new atrial fibrillation and increased ventricular rate.  Cardiac cath 11/05/2016 1. Severe 2 vessel CAD with continued patency of the LIMA-LAD and right radial graft-OM 2. Moderate, severely calcified, proximal circumflex stenosis with negative FFR assessment 3. Normal right heart hemodynamics except for a prominent V wave in the PCWP tracing 4. Normal/low LVEDP  Recommend: ongoing medical therapy. Pt appears to be well-compensated from a hemodynamic perspective.  Patient Profile     85 y.o. male with a hx of CAD s/p CABG, mitral valve repair, polymorphic VT s/p ICD Medtronic dual chamber, PFO closure, Persistent afib on Eliquis, Chronic systolic and diastolic HF (EF [id number]), HTN, DM, SIADH, carotid artery disease s/p R CEA 10/2018 and HLDwho is being seen today for the evaluation ofpre-operative cardiac evaluation for  acute pancreatitis.  Assessment & Plan    Pre-operative evaluation for acute biliary pancreatitis and possible appendicitis -Patient presented with severe abdominal pain. LFTs and WBC were elevated.CT abdomen showed possible acue pancreatitis with dilated CBD and gallstones concerning for infection. Placed on IVF and abx.GI following.Patient cannot have MRCP 2/2 AICD. Follow up CT showed incidental appendicitis as well, however patient has no acute pain.  - General surgery plan for lap appy and ERCP possible today - From a cardiac standpoint patient is at increased risk for surgery given multiple comorbidities, but able to undergo procedure if needed. No plan for further cardiac work-up at this time.  AtypicalChest Pain - HS troponin 11>20>15>24 Trend not consistent with ACS - EKG with no ischemic changes - Last cath in 2018 showed patent grafts and residual Cx disease  Infrarenal abdominal Aortic aneurysm 5.7 mm with no dissection - Vascular plan for surgery after acute pancreatitis is resolved  Paroxysmal Afib - Has been rate controlled - EKG from yesterday with QTc up to 623 ms.  PM Qtc was 618 ms. EKG ordered this morning. Will decreased dose to 250mg  BID. MD to see - Eliquis held and placed on heparin drip>>restart Eliquis per GI - Monitor QTc with daily EKG  Chronic systolic HF - EF in October 2020 showed 35-40% - Appears euvolemic on exam   For questions or updates, please contact [place] Please consult [email or web address] for contact info under        Signed, Jac Romulus Ninfa Meeker, PA-C  01/08/2019, 7:33 AM

## 2019-01-08 NOTE — Progress Notes (Signed)
[facility] Progress Note  [name] 85 y.o. Dec 02, 1931  CC: Abdominal pain, pancreatitis   Subjective: Patient just underwent laparoscopic cholecystectomy, IOC and appendectomy.  Questionable nonocclusive filling defect in the distal CBD.  Patient seen and examined in the recovery area.  Complaining of generalized abdominal discomfort.  ROS :  negative for fever and shortness of breath.   Objective: Vital signs in last 24 hours: Vitals:   01/08/19 1250 01/08/19 1251  BP: (!) 143/84   Pulse: (!) 106 (!) 106  Resp: (!) 24 (!) 21  Temp: (!) 97.4 F (36.3 C)   SpO2: 95% 97%    Physical Exam:  General:  Alert, cooperative, no distress, appears stated age  Head:  Normocephalic, without obvious abnormality, atraumatic  Eyes:  , EOM's intact,   Lungs:   Clear to auscultation bilaterally, respirations unlabored  Heart:  Regular rate and rhythm, S1, S2 normal  Abdomen:   Soft, mild generalized discomfort on palpation, bowel sounds present, no peritoneal signs  Extremities: Extremities normal, atraumatic, no  edema       Lab Results: Recent Labs    01/07/19 0319 01/08/19 0404  NA 131* 133*  K 3.3* 3.0*  CL 99 101  CO2 22 22  GLUCOSE 147* 157*  BUN 14 11  CREATININE 0.79 0.75  CALCIUM 7.9* 7.9*  MG 1.9 1.8   Recent Labs    01/07/19 0319 01/08/19 0404  AST 37 33  ALT 54* 48*  ALKPHOS 109 99  BILITOT 1.9* 1.3*  PROT 5.4* 5.2*  ALBUMIN 2.4* 2.4*   Recent Labs    01/07/19 0319 01/08/19 0404  WBC 8.3 7.8  NEUTROABS  --  6.3  HGB 10.6* 10.6*  HCT 30.5* 30.8*  MCV 101.0* 103.0*  PLT 125* 131*   No results for input(s): LABPROT, INR in the last 72 hours.    Assessment/Plan: -Acute pancreatitis.  Most likely gallstone.  Abdominal pain resolved. -Abnormal LFTs.  Improving. -Substernal chest pain.  Resolved. -Coronary artery disease, peripheral vascular disease, history of AICD placement -Chronic anticoagulation with apixaban.  On  heparin drip now - CHF   Recommendations ------------------------- -Intraoperative cholangiogram reviewed personally with Dr. Watt Climes.  Questionable nonocclusive filling defect in the distal CBD versus spasm of ampullary opening. -His LFTs have trended down significantly with T bili of only 1.3 now. - d/w Dr. Watt Climes.  Recheck LFTs in the morning.   - We will decide need for ERCP depending on clinical course and LFTs  - C diff negative . follow  GI pathogen panel  - GI will follow    Otis Brace MD, [place] 01/08/2019, 1:03 PM  Contact #  [phone]

## 2019-01-08 NOTE — Anesthesia Procedure Notes (Signed)
Arterial Line Insertion Start/End12/10/2018 10:20 AM, 01/08/2019 10:30 AM Performed by: Amadeo Garnet, CRNA, CRNA  Patient location: Pre-op. Preanesthetic checklist: patient identified, IV checked, site marked, risks and benefits discussed, surgical consent, monitors and equipment checked, pre-op evaluation and timeout performed Lidocaine 1% used for infiltration and patient sedated Left, radial was placed Catheter size: 20 G Hand hygiene performed  and maximum sterile barriers used   Attempts: 1 Procedure performed without using ultrasound guided technique. Following insertion, dressing applied and Biopatch. Patient tolerated the procedure well with no immediate complications.

## 2019-01-08 NOTE — Op Note (Signed)
Laparoscopic Cholecystectomy with IOC Procedure Note  Indications: This is an 85 year old male with multiple cardiac issues who presented with gallstone pancreatitis and possible choledocholithiasis.  His liver function tests have been improving so we are proceeding with laparoscopic cholecystectomy.  His CT scan also shows some incidental thickening of the appendix although he is asymptomatic.  He presents now for cholecystectomy as well as appendectomy.   Pre-operative Diagnosis: Gallstone pancreatitis/ choledocholithiasis/ chronic appendicitis  Post-operative Diagnosis: Same  Surgeon: Maia Petties   Assistants: none  Anesthesia: General endotracheal anesthesia  ASA Class: 3  Procedure Details  The patient was seen again in the Holding Room. The risks, benefits, complications, treatment options, and expected outcomes were discussed with the patient. The possibilities of reaction to medication, pulmonary aspiration, perforation of viscus, bleeding, recurrent infection, finding a normal gallbladder, the need for additional procedures, failure to diagnose a condition, the possible need to convert to an open procedure, and creating a complication requiring transfusion or operation were discussed with the patient. The likelihood of improving the patient's symptoms with return to their baseline status is good.  The patient and/or family concurred with the proposed plan, giving informed consent. The site of surgery properly noted. The patient was taken to Operating Room, identified as [name] and the procedure verified as Laparoscopic Cholecystectomy with Intraoperative Cholangiogram. A Time Out was held and the above information confirmed.  Prior to the induction of general anesthesia, antibiotic prophylaxis was administered. General endotracheal anesthesia was then administered and tolerated well. After the induction, the abdomen was prepped with Chloraprep and draped in the sterile  fashion. The patient was positioned in the supine position.  Local anesthetic agent was injected into the skin above the umbilicus and an incision made. We dissected down to the abdominal fascia with blunt dissection.  He has a small umbilical defect. The fascia was incised vertically from the defect and we entered the peritoneal cavity bluntly.  A pursestring suture of 0-Vicryl was placed around the fascial opening.  The Hasson cannula was inserted and secured with the stay suture.  Pneumoperitoneum was then created with CO2 and tolerated well without any adverse changes in the patient's vital signs. An 11-mm port was placed in the subxiphoid position.  Two 5-mm ports were placed in the right upper quadrant. All skin incisions were infiltrated with a local anesthetic agent before making the incision and placing the trocars.   We positioned the patient in reverse Trendelenburg, tilted slightly to the patient's left.  The gallbladder was identified, the fundus grasped and retracted cephalad. There are significant omental adhesions to the liver and gallbladder.  The gallbladder is quite distended.  Adhesions were lysed bluntly and with the electrocautery where indicated, taking care not to injure any adjacent organs or viscus. The infundibulum was grasped and retracted laterally, exposing the peritoneum overlying the triangle of Calot. This was then divided and exposed in a blunt fashion. A critical view of the cystic duct and cystic artery was obtained.  The cystic duct was clearly identified and bluntly dissected circumferentially. The cystic duct was ligated with a clip distally.   An incision was made in the cystic duct and the [facility] cholangiogram catheter introduced. The catheter was secured using a clip. A cholangiogram was then obtained which showed good visualization of the distal and proximal biliary tree with apparent distal common bile duct obstruction.  A tiny amount of contrast did enter the duodenum.  The catheter was then removed.   The cystic  duct was then ligated with clips and divided. The cystic artery was identified, dissected free, ligated with clips and divided as well.   The gallbladder was dissected from the liver bed in retrograde fashion with the electrocautery.  There was significant oozing from the liver bed, but this was controlled with cautery and SNOW.  The gallbladder was removed and placed in an Eco sac. The liver bed was irrigated and inspected.  Copious irrigation was utilized and was repeatedly aspirated until clear.  The gallbladder and Eco sac were then removed through the umbilical port site.    We then turned our attention to the appendix.  I positioned the patient in Trendelenburg tilted to his left.  The cecum was identified.  We identified the appendix which was heading down into the pelvis.  The tip of the appendix was adherent to the lateral abdominal wall.  We bluntly dissected this free.  There is no sign of inflammation or perforation.  The mesoappendix was ligated with the harmonic scalpel.  The base of the appendix was divided with an Endo GIA stapler.  We irrigated the right lower quadrant thoroughly.  There was some oozing from the adhesions to the abdominal wall.  These were addressed with the harmonic scalpel as well as some Surgicel.  We again inspected for hemostasis and the right pericolic gutter in the right upper quadrant.  The irrigation seem to be clear. Pneumoperitoneum was released as we removed the trocars.  4-0 Monocryl was used to close the skin.   Benzoin, steri-strips, and clean dressings were applied. The patient was then extubated and brought to the recovery room in stable condition. Instrument, sponge, and needle counts were correct at closure and at the conclusion of the case.   Findings: Cholecystitis with Cholelithiasis/ choledocholithiasis/ normal appearing appendix  Estimated Blood Loss: less than 100 mL         Drains: none          Specimens: Gallbladder           Complications: None; patient tolerated the procedure well.         Disposition: PACU - hemodynamically stable.         Condition: stable  [name]. Tony Dover, MD, [facility]  General/ Trauma Surgery   01/08/2019 12:48 PM

## 2019-01-08 NOTE — Progress Notes (Signed)
Pre-op report called and given to Robby, RN in Short Stay. All questions answered to satisfaction. OR personnel to transport pt.  

## 2019-01-08 NOTE — Anesthesia Postprocedure Evaluation (Signed)
Anesthesia Post Note  Patient: PHYNIX MATEY  Procedure(s) Performed: LAPAROSCOPIC CHOLECYSTECTOMY WITH INTRAOPERATIVE CHOLANGIOGRAM (N/A Abdomen) APPENDECTOMY LAPAROSCOPIC (N/A Abdomen)     Patient location during evaluation: PACU Anesthesia Type: General Level of consciousness: awake and alert, oriented and patient cooperative Pain management: pain level controlled Vital Signs Assessment: post-procedure vital signs reviewed and stable Respiratory status: spontaneous breathing, nonlabored ventilation and respiratory function stable Cardiovascular status: blood pressure returned to baseline and stable Postop Assessment: no apparent nausea or vomiting Anesthetic complications: no    Last Vitals:  Vitals:   01/08/19 1405 01/08/19 1415  BP: 130/69   Pulse: 91   Resp: (!) 24   Temp:  36.5 C  SpO2: 98%     Last Pain:  Vitals:   01/08/19 1345  TempSrc:   PainSc: [place]

## 2019-01-09 DIAGNOSIS — K8020 Calculus of gallbladder without cholecystitis without obstruction: Secondary | ICD-10-CM | POA: Diagnosis not present

## 2019-01-09 DIAGNOSIS — I25118 Atherosclerotic heart disease of native coronary artery with other forms of angina pectoris: Secondary | ICD-10-CM | POA: Diagnosis not present

## 2019-01-09 DIAGNOSIS — Z95810 Presence of automatic (implantable) cardiac defibrillator: Secondary | ICD-10-CM | POA: Diagnosis not present

## 2019-01-09 DIAGNOSIS — R9431 Abnormal electrocardiogram [ECG] [EKG]: Secondary | ICD-10-CM | POA: Diagnosis not present

## 2019-01-09 LAB — COMPREHENSIVE METABOLIC PANEL
ALT: 339 U/L — ABNORMAL HIGH (ref 0–44)
AST: 472 U/L — ABNORMAL HIGH (ref 15–41)
Albumin: 2.4 g/dL — ABNORMAL LOW (ref 3.5–5.0)
Alkaline Phosphatase: 322 U/L — ABNORMAL HIGH (ref 38–126)
Anion gap: 12 (ref 5–15)
BUN: 8 mg/dL (ref 8–23)
CO2: 22 mmol/L (ref 22–32)
Calcium: 8 mg/dL — ABNORMAL LOW (ref 8.9–10.3)
Chloride: 99 mmol/L (ref 98–111)
Creatinine, Ser: 0.76 mg/dL (ref 0.61–1.24)
GFR calc Af Amer: >60 mL/min (ref >60)
GFR calc non Af Amer: >60 mL/min (ref >60)
Glucose, Bld: 167 mg/dL — ABNORMAL HIGH (ref 70–99)
Potassium: 3.2 mmol/L — ABNORMAL LOW (ref 3.5–5.1)
Sodium: 133 mmol/L — ABNORMAL LOW (ref 135–145)
Total Bilirubin: 3.2 mg/dL — ABNORMAL HIGH (ref 0.3–1.2)
Total Protein: 5.2 g/dL — ABNORMAL LOW (ref 6.5–8.1)

## 2019-01-09 LAB — CBC
HCT: 31.9 % — ABNORMAL LOW (ref 39.0–52.0)
Hemoglobin: 10.7 g/dL — ABNORMAL LOW (ref 13.0–17.0)
MCH: 34.3 pg — ABNORMAL HIGH (ref 26.0–34.0)
MCHC: 33.5 g/dL (ref 30.0–36.0)
MCV: 102.2 fL — ABNORMAL HIGH (ref 80.0–100.0)
Platelets: 127 10*3/uL — ABNORMAL LOW (ref 150–400)
RBC: 3.12 MIL/uL — ABNORMAL LOW (ref 4.22–5.81)
RDW: 13.7 % (ref 11.5–15.5)
WBC: 6.3 10*3/uL (ref 4.0–10.5)
nRBC: 0 % (ref 0.0–0.2)

## 2019-01-09 LAB — HEPARIN LEVEL (UNFRACTIONATED): Heparin Unfractionated: <0.1 IU/mL — ABNORMAL LOW (ref 0.30–0.70)

## 2019-01-09 LAB — GLUCOSE, CAPILLARY
Glucose-Capillary: 146 mg/dL — ABNORMAL HIGH (ref 70–99)
Glucose-Capillary: 194 mg/dL — ABNORMAL HIGH (ref 70–99)
Glucose-Capillary: 209 mg/dL — ABNORMAL HIGH (ref 70–99)
Glucose-Capillary: 153 mg/dL — ABNORMAL HIGH (ref 70–99)
Glucose-Capillary: 154 mg/dL — ABNORMAL HIGH (ref 70–99)

## 2019-01-09 LAB — APTT: aPTT: 36 seconds (ref 24–36)

## 2019-01-09 MED ORDER — OXYCODONE HCL 5 MG PO TABS
5.0000 mg | ORAL_TABLET | ORAL | Status: DC | PRN
  Administered 2019-01-09: 5 mg via ORAL
  Filled 2019-01-09: qty 2

## 2019-01-09 MED ORDER — ACETAMINOPHEN 500 MG PO TABS
1000.0000 mg | ORAL_TABLET | Freq: Three times a day (TID) | ORAL | Status: DC
  Administered 2019-01-09 – 2019-01-14 (×15): 1000 mg via ORAL
  Filled 2019-01-09 (×15): qty 2

## 2019-01-09 MED ORDER — POTASSIUM CHLORIDE CRYS ER 20 MEQ PO TBCR
40.0000 meq | EXTENDED_RELEASE_TABLET | Freq: Once | ORAL | Status: AC
  Administered 2019-01-09: 40 meq via ORAL
  Filled 2019-01-09: qty 2

## 2019-01-09 MED ORDER — HEPARIN (PORCINE) 25000 UT/250ML-% IV SOLN
1100.0000 [IU]/h | INTRAVENOUS | Status: DC
  Administered 2019-01-09: 1100 [IU]/h via INTRAVENOUS
  Filled 2019-01-09: qty 250

## 2019-01-09 MED ORDER — IBUPROFEN 600 MG PO TABS: 600.0000 mg | ORAL_TABLET | Freq: Four times a day (QID) | ORAL | Status: DC | PRN

## 2019-01-09 NOTE — Progress Notes (Signed)
[place] for Heparin Indication: atrial fibrillation  Allergies  Allergen Reactions  . Bee Venom Anaphylaxis  . Latex Other (See Comments)    REDNESS AT REACTION SITE.  Marland Kitchen Metformin Diarrhea  . Rivaroxaban Other (See Comments)    Headaches, blurred vision  . Sotalol Other (See Comments)    "BLUE TOES"- cut his circulation off  . Warfarin Sodium Other (See Comments)    REACTION: migraine headaches/vision impariment    Patient Measurements: Height: 6\' 1"  (185.4 cm) Weight: 204 lb 12.9 oz (92.9 kg) IBW/kg (Calculated) : 79.9  Vital Signs: Temp: 97.9 F (36.6 C) (12/10 0441) Temp Source: Oral (12/10 0441) BP: 120/78 (12/10 0441) Pulse Rate: 75 (12/10 0441)  Labs: Recent Labs    01/06/19 1209 01/07/19 0319 01/08/19 0404 01/09/19 0500  HGB  --  10.6* 10.6* 10.7*  HCT  --  30.5* 30.8* 31.9*  PLT  --  125* 131* 127*  APTT  --  62* 79* 36  HEPARINUNFRC  --  0.32 0.14* <0.10*  CREATININE  --  0.79 0.75 0.76  TROPONINIHS 24*  --   --   --     Estimated Creatinine Clearance: 76.3 mL/min (by C-G formula based on SCr of 0.76 mg/dL).  Assessment: 85 y.o. male admitted with abdominal pain, h/o Afib and Eliquis on hold now on heparin  Heparin to resume this AM post surgery until 1 am 12/11 (to be held for ERCP)  Cbc stable  Goal of Therapy:  Heparin level 0.3-0.7 units/ml  APTT 66-102 Monitor platelets by anticoagulation protocol: Yes   Plan:  Resume heparin at 1100 units / hr Hold at 1 am for ERCP  Thank you Anette Guarneri, PharmD  Please check AMION for all [place] numbers  01/09/2019 8:43 AM

## 2019-01-09 NOTE — Plan of Care (Signed)

## 2019-01-09 NOTE — Progress Notes (Signed)
1 Day Post-Op    CC: Abdominal pain  Subjective: Doing well postop day #1.  Port sites look fine.  He is concerned about when and effectiveness of the next procedure.  No postoperative issues at this point. Objective: Vital signs in last 24 hours: Temp:  [97.4 F (36.3 C)-97.9 F (36.6 C)] 97.9 F (36.6 C) (12/10 0441) Pulse Rate:  [75-106] 75 (12/10 0441) Resp:  [16-26] 17 (12/10 0441) BP: (120-143)/(66-84) 120/78 (12/10 0441) SpO2:  [94 %-99 %] 97 % (12/10 0441) Arterial Line BP: (151-166)/(62-68) 158/63 (12/09 1419) Last BM Date: 01/08/19 800 IV recorded Nothing p.o. recorded Urine 625 Afebrile vital signs are stable K+ 3.2 LFTs elevated CMP Latest Ref Rng & Units 01/09/2019 01/08/2019 01/07/2019  Total Bilirubin 0.3 - 1.2 mg/dL 3.2(H) 1.3(H) 1.9(H)  Alkaline Phos 38 - 126 U/L 322(H) 99 109  AST 15 - 41 U/L 472(H) 33 37  ALT 0 - 44 U/L 339(H) 48(H) 54(H)   H/H 10.7/31.9 Intake/Output from previous day: 12/09 0701 - 12/10 0700 In: 800 [I.V.:800] Out: 675 [Urine:625; Blood:50] Intake/Output this shift: No intake/output data recorded.  General appearance: alert, cooperative and no distress Resp: clear to auscultation bilaterally GI: Soft, sore, normal postop discomfort.  Port sites dressings are dry.  Lab Results:  Recent Labs    01/08/19 0404 01/09/19 0500  WBC 7.8 6.3  HGB 10.6* 10.7*  HCT 30.8* 31.9*  PLT 131* 127*    BMET Recent Labs    01/08/19 0404 01/09/19 0500  NA 133* 133*  K 3.0* 3.2*  CL 101 99  CO2 22 22  GLUCOSE 157* 167*  BUN 11 8  CREATININE 0.75 0.76  CALCIUM 7.9* 8.0*   PT/INR No results for input(s): LABPROT, INR in the last 72 hours.  Recent Labs  Lab 01/05/19 0343 01/06/19 0318 01/07/19 0319 01/08/19 0404 01/09/19 0500  AST 60* 43* 37 33 472*  ALT 96* 73* 54* 48* 339*  ALKPHOS 131* 144* 109 99 322*  BILITOT 2.8* 3.1* 1.9* 1.3* 3.2*  PROT 5.7* 6.3* 5.4* 5.2* 5.2*  ALBUMIN 2.7* 2.7* 2.4* 2.4* 2.4*     Lipase      Component Value Date/Time   LIPASE 31 01/07/2019 0319     Medications: . arformoterol  15 mcg Nebulization BID  . dofetilide  250 mcg Oral BID  . insulin aspart  0-5 Units Subcutaneous QHS  . insulin aspart  0-9 Units Subcutaneous TID WC  . metoprolol succinate  50 mg Oral Daily  . pantoprazole (PROTONIX) IV  40 mg Intravenous Q24H  . potassium chloride  40 mEq Oral Once  . sodium chloride flush  10-40 mL Intracatheter Q12H  . umeclidinium bromide  1 puff Inhalation Daily   . sodium chloride 50 mL/hr at 01/08/19 1519  . lactated ringers 10 mL/hr at 01/08/19 0925  . piperacillin-tazobactam (ZOSYN)  IV 3.375 g (01/09/19 0517)   Assessment/Plan Atypical chest pain Chronic systolic congestive heart failure EF 35-40%  - cardiology following: Class III risk10.1% risk of death/MI/cardiac arrest; OK        for surgery Paroxysmal atrial fibrillation -rate control focusing/Toprol AICD Chronic anticoagulation on Eliquis Right CE 11/07/18 Infrarenal abdominal aortic aneurysm 5.7 cm Hyponatremia with history of SIADH Type 2 diabetes 5.7 cm abdominal aortic aneurysm -Vascular Surgery following Hx tobacco use    Gallstone pancreatitis/Probable choledocholithiasis/chronic appendicitis Laparoscopic cholecystectomy with intraoperative cholangiogram, appendectomy, 01/08/2019 Dr. Donnie Mesa  -IOC positive for distal common bile duct obstruction with stone  - Awaiting GI  evaluation  FEN: IV fluids/clear liquids ID: Zosyn 12/5 >> day 6 DVT:  Heparin drip on hold Follow up:  TBD   Plan: Doing well from our standpoint.  Hemoglobin/hematocrit are stable at this point.  Will defer to GI and their plans for ERCP about restarting heparin.  When you resume heparin I would do it without a bolus.  Will follow and arrange postop follow-up also.    LOS: 6 days    [name] 01/09/2019 Please see Amion

## 2019-01-09 NOTE — Progress Notes (Signed)
[facility] Gastroenterology Progress Note  [name] 85 y.o. Sep 10, 1931  CC: Abdominal pain, pancreatitis   Subjective: Patient seen and examined at bedside.  Complaining of right upper quadrant abdominal pain. afebrile.  ROS :  negative for fever and shortness of breath.   Objective: Vital signs in last 24 hours: Vitals:   01/08/19 2113 01/09/19 0441  BP:  120/78  Pulse: 78 75  Resp: 18 17  Temp:  97.9 F (36.6 C)  SpO2:  97%    Physical Exam:  General:  Alert, cooperative, no distress, appears stated age  Head:  Normocephalic, without obvious abnormality, atraumatic  Eyes:  , EOM's intact,   Lungs:   Clear to auscultation bilaterally, respirations unlabored  Heart:  Regular rate and rhythm, S1, S2 normal  Abdomen:   Soft, mild right upper quadrant tenderness to palpation,, bowel sounds present, no peritoneal signs  Extremities: Extremities normal, atraumatic, no  edema       Lab Results: Recent Labs    01/07/19 0319 01/08/19 0404 01/09/19 0500  NA 131* 133* 133*  K 3.3* 3.0* 3.2*  CL 99 101 99  CO2 22 22 22   GLUCOSE 147* 157* 167*  BUN 14 11 8   CREATININE 0.79 0.75 0.76  CALCIUM 7.9* 7.9* 8.0*  MG 1.9 1.8  --    Recent Labs    01/08/19 0404 01/09/19 0500  AST 33 472*  ALT 48* 339*  ALKPHOS 99 322*  BILITOT 1.3* 3.2*  PROT 5.2* 5.2*  ALBUMIN 2.4* 2.4*   Recent Labs    01/08/19 0404 01/09/19 0500  WBC 7.8 6.3  NEUTROABS 6.3  --   HGB 10.6* 10.7*  HCT 30.8* 31.9*  MCV 103.0* 102.2*  PLT 131* 127*   No results for input(s): LABPROT, INR in the last 72 hours.    Assessment/Plan: -Gallstone pancreatitis.  Status post lap chole with IOC yesterday.  Questionable small filling defect just above the ampulla in the distal CBD. -Abnormal LFTs.   -Substernal chest pain.  Resolved. -Coronary artery disease, peripheral vascular disease, history of AICD placement -Chronic anticoagulation with apixaban.  On heparin drip now - CHF  -Diarrhea.   Resolved.  C. difficile negative  Recommendations ------------------------- -Plan for ERCP tomorrow at 7:30 AM. -Discussed with pharmacist.  Resume heparin drip without bolus.  Stop heparin drip at 1 AM in the morning.  Continue Zosyn. - Also D/W Dr. Ree Kida .  Advance diet to full liquid.  Keep n.p.o. past midnight  Risks (post ERCP pancreatitis, bleeding, infection, bowel perforation that could require surgery, sedation-related changes in cardiopulmonary systems), benefits (identification and possible treatment of source of symptoms, exclusion of certain causes of symptoms), and alternatives (watchful waiting, radiographic imaging studies, empiric medical treatment)  were explained to patient in detail and patient wishes to proceed.   Otis Brace MD, [place] 01/09/2019, 8:46 AM  Contact #  [phone]

## 2019-01-09 NOTE — Progress Notes (Signed)
PT Cancellation Note  Patient Details Name: DAKODAH GULBRANDSEN MRN: [id number] DOB: [birth year]   Cancelled Treatment:    Reason Eval/Treat Not Completed: Pain limiting ability to participate(pt reports pain 6-8/10 with supine and activity. Awaiting pain meds and RN made aware)   Costantino Kohlbeck B Quint Chestnut 01/09/2019, 9:09 AM  Bayard Males, PT Acute Rehabilitation Services Pager: [phone] Office: [phone]

## 2019-01-09 NOTE — Progress Notes (Signed)
PROGRESS NOTE    [name]  [id number]:[id number] DOB: 02-23-1931 DOA: 01/03/2019 PCP: Joycelyn Rua, MD   Brief Narrative:  HPI On 01/04/2019 by Dr. Midge Minium [name] is a 85 y.o. male with history of CAD status post CABG, chronic systolic heart failure status post AICD placement, A. fib on apixaban and [name] had a reaction to amiodarone recently, recent carotid endarterectomy admitted recently for CHF in October 2 months ago presents to the ER with complaint of severe abdominal pain over the last 2 days with no nausea vomiting or diarrhea.  Abdominal pain is mostly in the lower part of the abdomen.  Likely.  Denies any fever chills or chest pain or shortness of breath.  Abdominal pain is dull severe pain not associated with food.  Assessment & Plan   Acute biliary pancreatitis -Patient will call us lithiasis with dilated CBD -CT scan on admission showed features concerning for acute pancreatitis with dilated CBD and gallstones with ductal enhancement concerning for infection -Initially managed by supportive care, bowel rest and IV fluids -[facility] and general surgery consulted -Patient was placed on Zosyn -Repeat CT abdomen on 01/06/2019 showed acute pancreatitis without necrosis, distended gallbladder with stones and CBD dilatation up to 30 mm with question of appendicitis as well -General surgery performed lap cholecystectomy with IOC.  Patient noted to have questionable filling defect in the distal CBD -MRCP could not be done as patient has pacemaker -Pending ERCP on 01/10/2019 by GI  Infrarenal abdominal aortic aneurysm -Measuring 5.7 cm no dissection -Patient will need elective vascular surgical intervention once gallstone pancreatitis issues have resolved -Vascular surgery was consulted and appreciated  Paroxysmal atrial fibrillation -Currently rate controlled -Eliquis held -On IV heparin -Continue [name] and metoprolol -Cardiology consulted  and appreciated  Hyponatremia -Patient with recent diagnosis of SIADH -Sodium currently 133 -Continue monitor BMP  Hypokalemia -Replacing  -Continue to monitor BMP  Chronic systolic heart failure -With history of AICD -Echocardiogram October 2020 showed an EF of 35 to 40% -Patient clinically euvolemic  -Cardiology consulted and appreciated  Diabetes mellitus, type II -Continue insulin sliding scale and CBG monitoring  Microcytic anemia -Vitamin B12, folate, TSH are all unremarkable and within normal limits -hemoglobin currently 10.7  -Continue to monitor CBC  Generalized deconditioning -PT recommending home health  DVT Prophylaxis  heparin  Code Status: Full  Family Communication: None at bedside  Disposition Plan: Admitted. Pending ERCP on 01/10/2019  Consultants Gastroenterology General surgery  Procedures  lap cholecystectomy with IOC.  Patient noted to have questionable filling defect in the distal CBD  Antibiotics   Anti-infectives (From admission, onward)   Start     Dose/Rate Route Frequency Ordered Stop   01/04/19 0430  piperacillin-tazobactam (ZOSYN) IVPB 3.375 g     3.375 g 12.5 mL/hr over 240 Minutes Intravenous Every 8 hours 01/04/19 0415        Subjective:   [name] seen and examined today.  Patient complaining of some abdominal pain today.  States he is post to have further surgery today.  Denies current nausea or vomiting, dizziness or headache, chest pain or shortness of breath.  Objective:   Vitals:   01/08/19 2047 01/08/19 2113 01/09/19 0441 01/09/19 0850  BP: 124/82  120/78   Pulse: 95 78 75   Resp: 18 18 17    Temp: 97.9 F (36.6 C)  97.9 F (36.6 C)   TempSrc: Oral  Oral   SpO2: 94%  97% 96%  Weight:  Height:        Intake/Output Summary (Last 24 hours) at 01/09/2019 1519 Last data filed at 01/09/2019 0816 Gross per 24 hour  Intake --  Output 825 ml  Net -825 ml   Filed Weights   01/04/19 0242 01/05/19 0500  01/06/19 0500  Weight: 92.9 kg 92.9 kg 92.9 kg    Exam  General: Well developed, well nourished, NAD, appears stated age  HEENT: NCAT, mucous membranes moist.   Cardiovascular: S1 S2 auscultated, RRR, SEM  Respiratory: Clear to auscultation bilaterally   Abdomen: Soft, tender, distended, + bowel sounds  Extremities: warm dry without cyanosis clubbing or edema  Neuro: AAOx3, nonfocal  Psych: Appropriate mood and affect   Data Reviewed: I have personally reviewed following labs and imaging studies  CBC: Recent Labs  Lab 01/03/19 1548 01/04/19 0500 01/05/19 0343 01/06/19 0318 01/07/19 0319 01/08/19 0404 01/09/19 0500  WBC 9.0 11.8* 12.7* 12.4* 8.3 7.8 6.3  NEUTROABS 7.8* 10.3*  --   --   --  6.3  --   HGB 14.4 12.7* 12.0* 12.1* 10.6* 10.6* 10.7*  HCT 43.6 37.6* 34.9* 35.0* 30.5* 30.8* 31.9*  MCV 103.3* 102.5* 101.7* 100.6* 101.0* 103.0* 102.2*  PLT 138* 123* 107* 121* 125* 131* 127*   Basic Metabolic Panel: Recent Labs  Lab 01/04/19 0500 01/05/19 0343 01/06/19 0318 01/07/19 0319 01/08/19 0404 01/09/19 0500  NA 131* 129* 132* 131* 133* 133*  K 4.6 4.2 3.7 3.3* 3.0* 3.2*  CL 99 97* 97* 99 101 99  CO2 24 24 19* 22 22 22   GLUCOSE 133* 189* 169* 147* 157* 167*  BUN 13 18 16 14 11 8   CREATININE 0.92 0.98 1.07 0.79 0.75 0.76  CALCIUM 8.4* 8.0* 8.1* 7.9* 7.9* 8.0*  MG 1.9 1.7 2.1 1.9 1.8  --    GFR: Estimated Creatinine Clearance: 76.3 mL/min (by C-G formula based on SCr of 0.76 mg/dL). Liver Function Tests: Recent Labs  Lab 01/05/19 0343 01/06/19 0318 01/07/19 0319 01/08/19 0404 01/09/19 0500  AST 60* 43* 37 33 472*  ALT 96* 73* 54* 48* 339*  ALKPHOS 131* 144* 109 99 322*  BILITOT 2.8* 3.1* 1.9* 1.3* 3.2*  PROT 5.7* 6.3* 5.4* 5.2* 5.2*  ALBUMIN 2.7* 2.7* 2.4* 2.4* 2.4*   Recent Labs  Lab 01/03/19 1548 01/04/19 0500 01/07/19 0319  LIPASE 2,777* 960* 31   No results for input(s): AMMONIA in the last 168 hours. Coagulation Profile: Recent Labs   Lab 01/04/19 0500  INR 1.3*   Cardiac Enzymes: No results for input(s): CKTOTAL, CKMB, CKMBINDEX, TROPONINI in the last 168 hours. BNP (last 3 results) No results for input(s): PROBNP in the last 8760 hours. HbA1C: No results for input(s): HGBA1C in the last 72 hours. CBG: Recent Labs  Lab 01/08/19 1503 01/08/19 1656 01/08/19 2050 01/09/19 0741 01/09/19 1134  GLUCAP 139* 149* 186* 146* 194*   Lipid Profile: No results for input(s): CHOL, HDL, LDLCALC, TRIG, CHOLHDL, LDLDIRECT in the last 72 hours. Thyroid Function Tests: Recent Labs    01/08/19 0404  TSH 2.274   Anemia Panel: Recent Labs    01/08/19 0404  VITAMINB12 888  FOLATE 32.7   Urine analysis:    Component Value Date/Time   COLORURINE YELLOW 01/03/2019 1417   APPEARANCEUR TURBID (A) 01/03/2019 1417   LABSPEC >1.030 (H) 01/03/2019 1417   PHURINE 6.0 01/03/2019 1417   GLUCOSEU 250 (A) 01/03/2019 1417   HGBUR NEGATIVE 01/03/2019 1417   BILIRUBINUR SMALL (A) 01/03/2019 1417   KETONESUR 15 (  A) 01/03/2019 1417   PROTEINUR 100 (A) 01/03/2019 1417   NITRITE NEGATIVE 01/03/2019 1417   LEUKOCYTESUR NEGATIVE 01/03/2019 1417   Sepsis Labs: @LABRCNTIP (procalcitonin:4,lacticidven:4)  ) Recent Results (from the past 240 hour(s))  Urine culture     Status: Abnormal   Collection Time: 01/03/19  2:17 PM   Specimen: Urine, Random  Result Value Ref Range Status   Specimen Description   Final    URINE, RANDOM Performed at [facility], [address] [place], [place] [address]    Special Requests   Final    NONE Performed at [facility], [address] [place], [place] [address]    Culture 20,000 COLONIES/mL ENTEROCOCCUS FAECALIS (A)  Final   Report Status 01/06/2019 FINAL  Final   Organism ID, Bacteria ENTEROCOCCUS FAECALIS (A)  Final      Susceptibility   Enterococcus faecalis - MIC*    AMPICILLIN <=2 SENSITIVE Sensitive     LEVOFLOXACIN 1 SENSITIVE Sensitive     NITROFURANTOIN  32 SENSITIVE Sensitive     VANCOMYCIN 1 SENSITIVE Sensitive     * 20,000 COLONIES/mL ENTEROCOCCUS FAECALIS  SARS CORONAVIRUS 2 (TAT 6-24 HRS) Nasopharyngeal Nasopharyngeal Swab     Status: None   Collection Time: 01/03/19  8:36 PM   Specimen: Nasopharyngeal Swab  Result Value Ref Range Status   SARS Coronavirus 2 NEGATIVE NEGATIVE Final    Comment: (NOTE) SARS-CoV-2 target nucleic acids are NOT DETECTED. The SARS-CoV-2 RNA is generally detectable in upper and lower respiratory specimens during the acute phase of infection. Negative results do not preclude SARS-CoV-2 infection, do not rule out co-infections with other pathogens, and should not be used as the sole basis for treatment or other patient management decisions. Negative results must be combined with clinical observations, patient history, and epidemiological information. The expected result is Negative. Fact Sheet for Patients: HairSlick.no Fact Sheet for Healthcare Providers: quierodirigir.com This test is not yet approved or cleared by the [place] FDA and  has been authorized for detection and/or diagnosis of SARS-CoV-2 by FDA under an Emergency Use Authorization (EUA). This EUA will remain  in effect (meaning this test can be used) for the duration of the COVID-19 declaration under Section 56 4(b)(1) of the Act, 21 U.S.C. section 360bbb-3(b)(1), unless the authorization is terminated or revoked sooner. Performed at [facility], [address], [place], [place] [address]   C difficile quick scan w PCR reflex     Status: None   Collection Time: 01/07/19  5:56 PM   Specimen: STOOL  Result Value Ref Range Status   C Diff antigen NEGATIVE NEGATIVE Final   C Diff toxin NEGATIVE NEGATIVE Final   C Diff interpretation No C. difficile detected.  Final    Comment: Performed at [facility], [address] [place], [place] [address]  Surgical PCR screen      Status: None   Collection Time: 01/08/19  6:51 AM   Specimen: Nasal Mucosa; Nasal Swab  Result Value Ref Range Status   MRSA, PCR NEGATIVE NEGATIVE Final   Staphylococcus aureus NEGATIVE NEGATIVE Final    Comment: (NOTE) The Xpert SA Assay (FDA approved for NASAL specimens in patients 49 years of age and older), is one component of a comprehensive surveillance program. It is not intended to diagnose infection nor to guide or monitor treatment. Performed at [facility], [address] [place], [place] [address]  Radiology Studies: DG Cholangiogram Operative  Result Date: 01/08/2019 CLINICAL DATA:  Intraoperative cholangiogram during laparoscopic cholecystectomy. EXAM: INTRAOPERATIVE CHOLANGIOGRAM FLUOROSCOPY TIME:  22 seconds COMPARISON:  CT abdomen pelvis - 01/06/2019 FINDINGS: Intraoperative cholangiographic images of the right upper abdominal quadrant during laparoscopic cholecystectomy are provided for review. Surgical clips overlie the expected location of the gallbladder fossa. Contrast injection demonstrates selective cannulation of the central aspect of the cystic duct. There is passage of contrast through the central aspect of the cystic duct with filling of a moderately dilated common bile duct. There is passage of contrast though the CBD and into the descending portion of the duodenum. There is minimal reflux of injected contrast into the common hepatic duct and central aspect of the non dilated intrahepatic biliary system. There is a persistent nonocclusive filling defect in the distal aspect of the CBD worrisome for nonocclusive choledocholithiasis. IMPRESSION: Suspected nonocclusive choledocholithiasis within the distal aspect of the CBD. Further evaluation and management with ERCP could be performed as indicated. Electronically Signed   By: Simonne Come M.D.   On: 01/08/2019 12:33     Scheduled Meds:  acetaminophen  1,000 mg Oral Q8H   arformoterol  15 mcg  Nebulization BID   dofetilide  250 mcg Oral BID   insulin aspart  0-5 Units Subcutaneous QHS   insulin aspart  0-9 Units Subcutaneous TID WC   metoprolol succinate  50 mg Oral Daily   pantoprazole (PROTONIX) IV  40 mg Intravenous Q24H   sodium chloride flush  10-40 mL Intracatheter Q12H   umeclidinium bromide  1 puff Inhalation Daily   Continuous Infusions:  sodium chloride 50 mL/hr at 01/08/19 1519   heparin 1,100 Units/hr (01/09/19 1121)   lactated ringers 10 mL/hr at 01/08/19 0925   piperacillin-tazobactam (ZOSYN)  IV 3.375 g (01/09/19 1438)     LOS: 6 days   Time Spent in minutes   45 minutes  Jayshaun Phillips D.O. on 01/09/2019 at 3:19 PM  Between 7am to 7pm - Please see pager noted on [email or web address]  After 7pm go to [email or web address]  And look for the night coverage person covering for me after hours  [facility] Office  [phone]

## 2019-01-09 NOTE — Progress Notes (Signed)
Progress Note  Patient Name: [name] Date of Encounter: 01/09/2019  Primary Cardiologist: Sherren Mocha, MD   Subjective   Doing well this AM. Still having abdominal pain. Qtc improved   Inpatient Medications    Scheduled Meds: . acetaminophen  1,000 mg Oral Q8H  . arformoterol  15 mcg Nebulization BID  . dofetilide  250 mcg Oral BID  . insulin aspart  0-5 Units Subcutaneous QHS  . insulin aspart  0-9 Units Subcutaneous TID WC  . metoprolol succinate  50 mg Oral Daily  . pantoprazole (PROTONIX) IV  40 mg Intravenous Q24H  . sodium chloride flush  10-40 mL Intracatheter Q12H  . umeclidinium bromide  1 puff Inhalation Daily   Continuous Infusions: . sodium chloride 50 mL/hr at 01/08/19 1519  . heparin    . lactated ringers 10 mL/hr at 01/08/19 0925  . piperacillin-tazobactam (ZOSYN)  IV 3.375 g (01/09/19 0517)   PRN Meds: alum & mag hydroxide-simeth, ibuprofen, ipratropium-albuterol, metoCLOPramide (REGLAN) injection, morphine injection, morphine injection, oxyCODONE, sodium chloride flush   Vital Signs    Vitals:   01/08/19 2047 01/08/19 2113 01/09/19 0441 01/09/19 0850  BP: 124/82  120/78   Pulse: 95 78 75   Resp: 18 18 17    Temp: 97.9 F (36.6 C)  97.9 F (36.6 C)   TempSrc: Oral  Oral   SpO2: 94%  97% 96%  Weight:      Height:        Intake/Output Summary (Last 24 hours) at 01/09/2019 0921 Last data filed at 01/09/2019 0100 Gross per 24 hour  Intake 800 ml  Output 675 ml  Net 125 ml   Filed Weights   01/04/19 0242 01/05/19 0500 01/06/19 0500  Weight: 92.9 kg 92.9 kg 92.9 kg    Physical Exam   General: Well developed, well nourished, NAD Skin: Warm, dry, intact  Lungs:Clear to ausculation bilaterally. No wheezes. Breathing is unlabored. Cardiovascular: RRR with S1 S2. + murmur Abdomen: Soft, tender, distended. No obvious abdominal masses. MSK: Strength and tone appear normal for age. 5/5 in all extremities Extremities: No edema.  No clubbing or cyanosis. PT pulses 2+ bilaterally Neuro: Alert and oriented. No focal deficits. No facial asymmetry. MAE spontaneously. Psych: Responds to questions appropriately with normal affect.    Labs    Chemistry Recent Labs  Lab 01/07/19 0319 01/08/19 0404 01/09/19 0500  NA 131* 133* 133*  K 3.3* 3.0* 3.2*  CL 99 101 99  CO2 22 22 22   GLUCOSE 147* 157* 167*  BUN 14 11 8   CREATININE 0.79 0.75 0.76  CALCIUM 7.9* 7.9* 8.0*  PROT 5.4* 5.2* 5.2*  ALBUMIN 2.4* 2.4* 2.4*  AST 37 33 472*  ALT 54* 48* 339*  ALKPHOS 109 99 322*  BILITOT 1.9* 1.3* 3.2*  GFRNONAA >60 >60 >60  GFRAA >60 >60 >60  ANIONGAP 10 10 12      Hematology Recent Labs  Lab 01/07/19 0319 01/08/19 0404 01/09/19 0500  WBC 8.3 7.8 6.3  RBC 3.02* 2.99* 3.12*  HGB 10.6* 10.6* 10.7*  HCT 30.5* 30.8* 31.9*  MCV 101.0* 103.0* 102.2*  MCH 35.1* 35.5* 34.3*  MCHC 34.8 34.4 33.5  RDW 13.4 13.6 13.7  PLT 125* 131* 127*    Cardiac EnzymesNo results for input(s): TROPONINI in the last 168 hours. No results for input(s): TROPIPOC in the last 168 hours.   BNP Recent Labs  Lab 01/03/19 1548  BNP 295.0*     DDimer No results for  input(s): DDIMER in the last 168 hours.   Radiology    DG Cholangiogram Operative  Result Date: 01/08/2019 CLINICAL DATA:  Intraoperative cholangiogram during laparoscopic cholecystectomy. EXAM: INTRAOPERATIVE CHOLANGIOGRAM FLUOROSCOPY TIME:  22 seconds COMPARISON:  CT abdomen pelvis - 01/06/2019 FINDINGS: Intraoperative cholangiographic images of the right upper abdominal quadrant during laparoscopic cholecystectomy are provided for review. Surgical clips overlie the expected location of the gallbladder fossa. Contrast injection demonstrates selective cannulation of the central aspect of the cystic duct. There is passage of contrast through the central aspect of the cystic duct with filling of a moderately dilated common bile duct. There is passage of contrast though the CBD and  into the descending portion of the duodenum. There is minimal reflux of injected contrast into the common hepatic duct and central aspect of the non dilated intrahepatic biliary system. There is a persistent nonocclusive filling defect in the distal aspect of the CBD worrisome for nonocclusive choledocholithiasis. IMPRESSION: Suspected nonocclusive choledocholithiasis within the distal aspect of the CBD. Further evaluation and management with ERCP could be performed as indicated. Electronically Signed   By: Sandi Mariscal M.D.   On: 01/08/2019 12:33    Telemetry    Not currently on telemetry - Personally Reviewed  ECG    01/09/2019 V paced, HR 94bpm, PVCs and Qtc at 482 - Personally Reviewed  Cardiac Studies   Echo 11/05/18 1. Left ventricular ejection fraction, by visual estimation, is 35 to 40%. The left ventricle has moderately decreased function. There is global left ventricular hypokinesis and marked apical-basal and septal-lateral dyssynchrony. There appears to be  apical akinesis. 2. Abnormal septal motion consistent with RV pacemaker. 3. Global right ventricle has moderately reduced systolic function.The right ventricular size is normal. No increase in right ventricular wall thickness. 4. Left atrial size was severely dilated. 5. Right atrial size was moderately dilated. 6. Mild mitral valve regurgitation. Mild-moderate mitral stenosis. Peak and mean gradients are 16 and 5 mm Hg, respectively, at 70 bpm. Estimated valve area is 1.4 cm sq by the PHT method. 7. Moderate calcification of the mitral valve leaflet(s). 8. Severe mitral annular calcification. 9. Severe thickening of the mitral valve leaflet(s). 10. The tricuspid valve is normal in structure. Tricuspid valve regurgitation is mild-moderate. 11. The aortic valve is normal in structure. Aortic valve regurgitation was not visualized by color flow Doppler. 12. The pulmonic valve was normal in structure. Pulmonic valve  regurgitation is mild by color flow Doppler. 13. Mildly elevated pulmonary artery systolic pressure. 14. A pacer wire is visualized. 15. The inferior vena cava is dilated in size with >50% respiratory variability, suggesting right atrial pressure of 8 mmHg. 16. Left ventricular diastolic function could not be evaluated due to atrial fibrillation and mitral stenosis. 9. Compared to February 2020, mitral valve gradients are worse, probably due to new atrial fibrillation and increased ventricular rate.  Cardiac cath 11/05/2016 1. Severe 2 vessel CAD with continued patency of the LIMA-LAD and right radial graft-OM 2. Moderate, severely calcified, proximal circumflex stenosis with negative FFR assessment 3. Normal right heart hemodynamics except for a prominent V wave in the PCWP tracing 4. Normal/low LVEDP  Recommend: ongoing medical therapy. Pt appears to be well-compensated from a hemodynamic perspective.  Patient Profile     85 y.o. male with a hx of CAD s/p CABG, mitral valve repair, polymorphic VT s/p ICD [employer] dual chamber, PFO closure, Persistent afib on Eliquis, Chronic systolic and diastolic HF (EF [id number]), HTN, DM, SIADH, carotid artery disease s/p  R CEA 10/2018 and HLDwho is being seen today for the evaluation ofpre-operative cardiac evaluation for acute pancreatitis.  Assessment & Plan    1. Pre-operative evaluation for acute biliary pancreatitisand possible appendicitis: -Underwent lap chole 12/09 with plans for ERCP tomorrow, 01/10/2019 -Plan to resume Hep gtt without bolus >>>stop Hep gtt at 1am for surgery  -From a cardiac standpoint patient is at increased risk for surgery givenmultiplecomorbidities, but able to undergo procedure if needed.No plan for further cardiac work-up at this time.  2. AtypicalChest Pain: -HS troponin 11>20>15>24 not consistent with ACS -EKGwith no ischemic changes however with prolonged Qtc at 512ms>>repeat today at 495ms -No  recurrence   3. Infrarenal abdominal Aortic aneurysm 5.7 mm with no dissection: -Vascular plan for surgery after acute pancreatitis is resolved  4. Paroxysmal Afib: -Rate controlledat 94 on EKG>>place back on telemetry  -EKG from 12/08 with QTc at 576ms. Repeat EKG with Qtc at 482 -[name] at 236mcg BID>>continue   -Eliquis held and placed on heparin drip>>restart Eliquis per GI  5. Chronic systolic HF: -LVEF [id number] at 35-40%>>ICD in place -On home BB  -Appears euvolemic on exam  Signed, Kathyrn Drown NP-C [place] Pager: [phone] 01/09/2019, 9:21 AM     For questions or updates, please contact   Please consult [email or web address] for contact info under Cardiology/STEMI.

## 2019-01-09 NOTE — Progress Notes (Signed)
Physical Therapy Treatment Patient Details Name: [name] MRN: [id number] DOB: 17-May-1931 Today's Date: 01/09/2019    History of Present Illness 85 yo male admitted for lower abdominal pain and acute pancreatitis. Pt s/p lap cholecystectomy and appendectomy 12/9. Planned ERCP for 12/11. PMH including but not limited to COPD, SOB, orthopnea, CHF, hyponatremia, V-tach, PAF, ICD placement, DM, mitral valve replacement, knee arthroscopy, CABG, cardiac cath, A-fib    PT Comments    Pt reporting continued abdominal pain despite premedication but willing to perform limited mobility this session. Pt with most pain in RUQ and educated for splinting with pillow with coughing and transfers with pt reporting increased comfort with this technique. Pt able to walk in room and perform HEP with pt encouraged to continue performance throughout the day with nursing staff. Will continue to follow to maximize gait and function as pt needs to be able to ascend flight of stairs as well.     Follow Up Recommendations  Home health PT     Equipment Recommendations  None recommended by PT    Recommendations for Other Services       Precautions / Restrictions Precautions Precautions: Fall Restrictions Weight Bearing Restrictions: No    Mobility  Bed Mobility Overal bed mobility: Needs Assistance Bed Mobility: Rolling;Sidelying to Sit Rolling: Supervision Sidelying to sit: Min assist       General bed mobility comments: cues for sequence to decrease abdominal strain with bed flat, min assist to elevate trunk from side with pt splinting with pillow for bed mobility  Transfers Overall transfer level: Needs assistance   Transfers: Sit to/from Stand Sit to Stand: Min guard         General transfer comment: cues for hand placement to not pull on RW  Ambulation/Gait Ambulation/Gait assistance: Min guard Gait Distance (Feet): 15 Feet Assistive device: Rolling walker (2 wheeled) Gait  Pattern/deviations: Step-through pattern;Decreased stride length;Trunk flexed   Gait velocity interpretation: >2.62 ft/sec, indicative of community ambulatory General Gait Details: pt with abdominal guarding with gait and limiting distance to in room due to pain and fatigue   Stairs             Wheelchair Mobility    Modified Rankin (Stroke Patients Only)       Balance Overall balance assessment: No apparent balance deficits (not formally assessed)                                          Cognition Arousal/Alertness: Awake/alert Behavior During Therapy: WFL for tasks assessed/performed Overall Cognitive Status: Within Functional Limits for tasks assessed                                        Exercises General Exercises - Lower Extremity Long Arc Quad: AROM;Both;Seated;15 reps Hip ABduction/ADduction: AROM;Both;Seated;15 reps Hip Flexion/Marching: AROM;Both;10 reps;Seated    General Comments        Pertinent Vitals/Pain Pain Assessment: 0-10 Pain Score: 4  Pain Location: right abdomen Pain Descriptors / Indicators: Aching;Guarding Pain Intervention(s): Limited activity within patient's tolerance;Monitored during session;Premedicated before session;Repositioned    Home Living                      Prior Function            PT  Goals (current goals can now be found in the care plan section) Progress towards PT goals: Progressing toward goals    Frequency    Min 3X/week      PT Plan Current plan remains appropriate    Co-evaluation              AM-PAC PT "6 Clicks" Mobility   Outcome Measure  Help needed turning from your back to your side while in a flat bed without using bedrails?: A Little Help needed moving from lying on your back to sitting on the side of a flat bed without using bedrails?: A Little Help needed moving to and from a bed to a chair (including a wheelchair)?: A Little Help needed  standing up from a chair using your arms (e.g., wheelchair or bedside chair)?: A Little Help needed to walk in hospital room?: A Little Help needed climbing 3-5 steps with a railing? : A Lot 6 Click Score: 17    End of Session   Activity Tolerance: Patient limited by pain;Patient limited by fatigue Patient left: in chair;with call bell/phone within reach Nurse Communication: Mobility status PT Visit Diagnosis: Difficulty in walking, not elsewhere classified (R26.2);Other abnormalities of gait and mobility (R26.89)     Time: 7846-9629 PT Time Calculation (min) (ACUTE ONLY): 17 min  Charges:  $Therapeutic Activity: 8-22 mins                     Juergen Hardenbrook P, PT Acute Rehabilitation Services Pager: [phone] Office: [phone]    Enedina Finner Tuvia Woodrick 01/09/2019, 12:35 PM

## 2019-01-10 ENCOUNTER — Encounter (HOSPITAL_COMMUNITY): Payer: Self-pay | Admitting: Internal Medicine

## 2019-01-10 ENCOUNTER — Inpatient Hospital Stay (HOSPITAL_COMMUNITY): Payer: Medicare Other | Admitting: Certified Registered"

## 2019-01-10 ENCOUNTER — Inpatient Hospital Stay (HOSPITAL_COMMUNITY): Payer: Medicare Other

## 2019-01-10 ENCOUNTER — Encounter (HOSPITAL_COMMUNITY)
Admission: EM | Disposition: A | Discharge status: Home Health | Payer: Self-pay | Source: Home / Self Care | Attending: Internal Medicine

## 2019-01-10 DIAGNOSIS — R9431 Abnormal electrocardiogram [ECG] [EKG]: Secondary | ICD-10-CM | POA: Diagnosis not present

## 2019-01-10 DIAGNOSIS — I5021 Acute systolic (congestive) heart failure: Secondary | ICD-10-CM | POA: Diagnosis not present

## 2019-01-10 DIAGNOSIS — Z95810 Presence of automatic (implantable) cardiac defibrillator: Secondary | ICD-10-CM | POA: Diagnosis not present

## 2019-01-10 DIAGNOSIS — I480 Paroxysmal atrial fibrillation: Secondary | ICD-10-CM | POA: Diagnosis not present

## 2019-01-10 HISTORY — PX: REMOVAL OF STONES: SHX5545

## 2019-01-10 HISTORY — PX: SPHINCTEROTOMY: SHX5544

## 2019-01-10 HISTORY — PX: ERCP: SHX5425

## 2019-01-10 LAB — COMPREHENSIVE METABOLIC PANEL
ALT: 321 U/L — ABNORMAL HIGH (ref 0–44)
AST: 282 U/L — ABNORMAL HIGH (ref 15–41)
Albumin: 2.4 g/dL — ABNORMAL LOW (ref 3.5–5.0)
Alkaline Phosphatase: 443 U/L — ABNORMAL HIGH (ref 38–126)
Anion gap: 10 (ref 5–15)
BUN: 6 mg/dL — ABNORMAL LOW (ref 8–23)
CO2: 24 mmol/L (ref 22–32)
Calcium: 8.2 mg/dL — ABNORMAL LOW (ref 8.9–10.3)
Chloride: 98 mmol/L (ref 98–111)
Creatinine, Ser: 0.82 mg/dL (ref 0.61–1.24)
GFR calc Af Amer: >60 mL/min (ref >60)
GFR calc non Af Amer: >60 mL/min (ref >60)
Glucose, Bld: 179 mg/dL — ABNORMAL HIGH (ref 70–99)
Potassium: 3.7 mmol/L (ref 3.5–5.1)
Sodium: 132 mmol/L — ABNORMAL LOW (ref 135–145)
Total Bilirubin: 4 mg/dL — ABNORMAL HIGH (ref 0.3–1.2)
Total Protein: 5.5 g/dL — ABNORMAL LOW (ref 6.5–8.1)

## 2019-01-10 LAB — CBC
HCT: 31.6 % — ABNORMAL LOW (ref 39.0–52.0)
Hemoglobin: 10.7 g/dL — ABNORMAL LOW (ref 13.0–17.0)
MCH: 34.7 pg — ABNORMAL HIGH (ref 26.0–34.0)
MCHC: 33.9 g/dL (ref 30.0–36.0)
MCV: 102.6 fL — ABNORMAL HIGH (ref 80.0–100.0)
Platelets: 139 10*3/uL — ABNORMAL LOW (ref 150–400)
RBC: 3.08 MIL/uL — ABNORMAL LOW (ref 4.22–5.81)
RDW: 14.1 % (ref 11.5–15.5)
WBC: 5.5 10*3/uL (ref 4.0–10.5)
nRBC: 0 % (ref 0.0–0.2)

## 2019-01-10 LAB — GLUCOSE, CAPILLARY
Glucose-Capillary: 168 mg/dL — ABNORMAL HIGH (ref 70–99)
Glucose-Capillary: 184 mg/dL — ABNORMAL HIGH (ref 70–99)
Glucose-Capillary: 169 mg/dL — ABNORMAL HIGH (ref 70–99)
Glucose-Capillary: 172 mg/dL — ABNORMAL HIGH (ref 70–99)

## 2019-01-10 LAB — APTT: aPTT: 31 seconds (ref 24–36)

## 2019-01-10 LAB — HEPARIN LEVEL (UNFRACTIONATED): Heparin Unfractionated: <0.1 IU/mL — ABNORMAL LOW (ref 0.30–0.70)

## 2019-01-10 LAB — MAGNESIUM: Magnesium: 1.8 mg/dL (ref 1.7–2.4)

## 2019-01-10 SURGERY — ERCP, WITH INTERVENTION IF INDICATED
Anesthesia: General

## 2019-01-10 MED ORDER — PHENYLEPHRINE HCL-NACL 10-0.9 MG/250ML-% IV SOLN
INTRAVENOUS | Status: DC | PRN
  Administered 2019-01-10: 50 ug/min via INTRAVENOUS

## 2019-01-10 MED ORDER — LIDOCAINE 2% (20 MG/ML) 5 ML SYRINGE
INTRAMUSCULAR | Status: DC | PRN
  Administered 2019-01-10: 100 mg via INTRAVENOUS

## 2019-01-10 MED ORDER — SODIUM CHLORIDE 0.9 % IV SOLN
INTRAVENOUS | Status: DC | PRN
  Administered 2019-01-10: 08:00:00 via INTRAVENOUS

## 2019-01-10 MED ORDER — INDOMETHACIN 50 MG RE SUPP
RECTAL | Status: AC
  Filled 2019-01-10: qty 2

## 2019-01-10 MED ORDER — FENTANYL CITRATE (PF) 250 MCG/5ML IJ SOLN
INTRAMUSCULAR | Status: DC | PRN
  Administered 2019-01-10 (×2): 50 ug via INTRAVENOUS

## 2019-01-10 MED ORDER — PHENYLEPHRINE 40 MCG/ML (10ML) SYRINGE FOR IV PUSH (FOR BLOOD PRESSURE SUPPORT)
PREFILLED_SYRINGE | INTRAVENOUS | Status: DC | PRN
  Administered 2019-01-10 (×3): 80 ug via INTRAVENOUS

## 2019-01-10 MED ORDER — GLUCAGON HCL RDNA (DIAGNOSTIC) 1 MG IJ SOLR
INTRAMUSCULAR | Status: AC
  Filled 2019-01-10: qty 1

## 2019-01-10 MED ORDER — PROPOFOL 10 MG/ML IV BOLUS
INTRAVENOUS | Status: DC | PRN
  Administered 2019-01-10: 100 mg via INTRAVENOUS

## 2019-01-10 MED ORDER — ROCURONIUM BROMIDE 10 MG/ML (PF) SYRINGE
PREFILLED_SYRINGE | INTRAVENOUS | Status: DC | PRN
  Administered 2019-01-10: 60 mg via INTRAVENOUS

## 2019-01-10 MED ORDER — FUROSEMIDE 10 MG/ML IJ SOLN: 40.0000 mg | Freq: Two times a day (BID) | INTRAMUSCULAR | Status: DC

## 2019-01-10 MED ORDER — ONDANSETRON HCL 4 MG/2ML IJ SOLN
INTRAMUSCULAR | Status: DC | PRN
  Administered 2019-01-10: 4 mg via INTRAVENOUS

## 2019-01-10 MED ORDER — POTASSIUM CHLORIDE CRYS ER 20 MEQ PO TBCR
40.0000 meq | EXTENDED_RELEASE_TABLET | Freq: Once | ORAL | Status: AC
  Administered 2019-01-10: 40 meq via ORAL
  Filled 2019-01-10: qty 2

## 2019-01-10 MED ORDER — INDOMETHACIN 50 MG RE SUPP
RECTAL | Status: DC | PRN
  Administered 2019-01-10: 100 mg via RECTAL

## 2019-01-10 MED ORDER — HEPARIN (PORCINE) 25000 UT/250ML-% IV SOLN
1400.0000 [IU]/h | INTRAVENOUS | Status: DC
  Administered 2019-01-10: 1150 [IU]/h via INTRAVENOUS
  Administered 2019-01-11: 1250 [IU]/h via INTRAVENOUS
  Administered 2019-01-11 – 2019-01-12 (×2): 1400 [IU]/h via INTRAVENOUS
  Filled 2019-01-10 (×3): qty 250

## 2019-01-10 MED ORDER — SUGAMMADEX SODIUM 200 MG/2ML IV SOLN
INTRAVENOUS | Status: DC | PRN
  Administered 2019-01-10: 200 mg via INTRAVENOUS

## 2019-01-10 MED ORDER — FUROSEMIDE 10 MG/ML IJ SOLN
40.0000 mg | Freq: Two times a day (BID) | INTRAMUSCULAR | Status: DC
  Administered 2019-01-10 – 2019-01-14 (×9): 40 mg via INTRAVENOUS
  Filled 2019-01-10 (×9): qty 4

## 2019-01-10 MED ORDER — SODIUM CHLORIDE 0.9 % IV SOLN
INTRAVENOUS | Status: DC | PRN
  Administered 2019-01-10: 25 mL

## 2019-01-10 NOTE — Progress Notes (Signed)
PT Cancellation Note  Patient Details Name: [name] MRN: [id number] DOB: Jan 23, 1932   Cancelled Treatment:    Reason Eval/Treat Not Completed: Fatigue/lethargy limiting ability to participate. PT attempted x2, pt declined PT visit at this time as he is still feeling dizzy and sleepy from procedure this am. PT will continue to follow and treat as time/schedule allow.   Mickey Farber, PT, DPT   Acute Rehabilitation Department [phone]   Otho Bellows 01/10/2019, 3:40 PM

## 2019-01-10 NOTE — Transfer of Care (Signed)
Immediate Anesthesia Transfer of Care Note  Patient: MARCUZ WARRINER  Procedure(s) Performed: ENDOSCOPIC RETROGRADE CHOLANGIOPANCREATOGRAPHY (ERCP) (N/A ) SPHINCTEROTOMY REMOVAL OF STONES  Patient Location: Endoscopy Unit  Anesthesia Type:General  Level of Consciousness: awake, alert  and oriented  Airway & Oxygen Therapy: Patient Spontanous Breathing and Patient connected to face mask oxygen  Post-op Assessment: Report given to RN, Post -op Vital signs reviewed and stable and Patient moving all extremities  Post vital signs: Reviewed and stable  Last Vitals:  Vitals Value Taken Time  BP 143/88 01/10/19 0840  Temp 37.1 C 01/10/19 0840  Pulse 93 01/10/19 0845  Resp 21 01/10/19 0845  SpO2 99 % 01/10/19 0845  Vitals shown include unvalidated device data.  Last Pain:  Vitals:   01/10/19 0840  TempSrc: Oral  PainSc: 0-No pain      Patients Stated Pain Goal: 2 ([id number])  Complications: No apparent anesthesia complications

## 2019-01-10 NOTE — Progress Notes (Addendum)
Progress Note  Patient Name: [name] Date of Encounter: 01/10/2019  Primary Cardiologist: Sherren Mocha, MD   Subjective   Pt has just returned from ERCP. He says that he is still a little dizzy from the anesthesia. He has mild shortness of breath just since procedure, none overnight. Denies chest pain, has mild abd tenderness at operative site. Of note, his JVP is elevated on exam - chart reviewed, his is now 8L positive with an addition 4L IV today (has LVEF 35-40%).  Inpatient Medications    Scheduled Meds: . acetaminophen  1,000 mg Oral Q8H  . arformoterol  15 mcg Nebulization BID  . dofetilide  250 mcg Oral BID  . insulin aspart  0-5 Units Subcutaneous QHS  . insulin aspart  0-9 Units Subcutaneous TID WC  . metoprolol succinate  50 mg Oral Daily  . pantoprazole (PROTONIX) IV  40 mg Intravenous Q24H  . sodium chloride flush  10-40 mL Intracatheter Q12H  . umeclidinium bromide  1 puff Inhalation Daily   Continuous Infusions: . sodium chloride 50 mL/hr at 01/10/19 0506  . lactated ringers 10 mL/hr at 01/08/19 0925  . piperacillin-tazobactam (ZOSYN)  IV 3.375 g (01/10/19 0509)   PRN Meds: alum & mag hydroxide-simeth, ipratropium-albuterol, metoCLOPramide (REGLAN) injection, morphine injection, morphine injection, oxyCODONE, sodium chloride flush   Vital Signs    Vitals:   01/10/19 0840 01/10/19 0850 01/10/19 0900 01/10/19 0910  BP: (!) 143/88 (!) 140/97 (!) 154/90 125/77  Pulse: 90 94 95 95  Resp: (!) 23 20 (!) 21 17  Temp: 98.8 F (37.1 C)     TempSrc: Oral     SpO2: 100% 99% 100% 92%  Weight:      Height:        Intake/Output Summary (Last 24 hours) at 01/10/2019 0930 Last data filed at 01/10/2019 0845 Gross per 24 hour  Intake 4954.27 ml  Output 150 ml  Net 4804.27 ml   Last 3 Weights 01/06/2019 01/05/2019 01/04/2019  Weight (lbs) 204 lb 12.9 oz 204 lb 12.9 oz 204 lb 12.9 oz  Weight (kg) 92.9 kg 92.9 kg 92.9 kg      Telemetry    Mostly V  pacing 80's-90's with underlying ?sinus rhythm - Personally Reviewed  ECG    Atrial fibrillation per machine reading with frequent V pacing. ?p waves present. 89 bpm, QTC 540 (I recalculated- 536) -Personally reviewed.   Physical Exam   GEN:  Confused Neck: JVP elevated to earlobe Cardiac: RRR, no murmurs, rubs, or gallops.  Respiratory: diminished breath sounds and bibasilar crackles GI: Soft, mild tenderness MS: No edema; No deformity. Neuro:  Nonfocal  Psych: Normal affect   Labs    High Sensitivity Troponin:   Recent Labs  Lab 01/03/19 1548 01/03/19 1755 01/04/19 0500 01/04/19 0844 01/06/19 1209  TROPONINIHS 10 11 20* 15 24*      Chemistry Recent Labs  Lab 01/08/19 0404 01/09/19 0500 01/10/19 0451  NA 133* 133* 132*  K 3.0* 3.2* 3.7  CL 101 99 98  CO2 22 22 24   GLUCOSE 157* 167* 179*  BUN 11 8 6*  CREATININE 0.75 0.76 0.82  CALCIUM 7.9* 8.0* 8.2*  PROT 5.2* 5.2* 5.5*  ALBUMIN 2.4* 2.4* 2.4*  AST 33 472* 282*  ALT 48* 339* 321*  ALKPHOS 99 322* 443*  BILITOT 1.3* 3.2* 4.0*  GFRNONAA >60 >60 >60  GFRAA >60 >60 >60  ANIONGAP 10 12 10      Hematology Recent Labs  Lab  01/08/19 0404 01/09/19 0500 01/10/19 0451  WBC 7.8 6.3 5.5  RBC 2.99* 3.12* 3.08*  HGB 10.6* 10.7* 10.7*  HCT 30.8* 31.9* 31.6*  MCV 103.0* 102.2* 102.6*  MCH 35.5* 34.3* 34.7*  MCHC 34.4 33.5 33.9  RDW 13.6 13.7 14.1  PLT 131* 127* 139*    BNP Recent Labs  Lab 01/03/19 1548  BNP 295.0*     DDimer No results for input(s): DDIMER in the last 168 hours.   Radiology    DG Cholangiogram Operative  Result Date: 01/08/2019 CLINICAL DATA:  Intraoperative cholangiogram during laparoscopic cholecystectomy. EXAM: INTRAOPERATIVE CHOLANGIOGRAM FLUOROSCOPY TIME:  22 seconds COMPARISON:  CT abdomen pelvis - 01/06/2019 FINDINGS: Intraoperative cholangiographic images of the right upper abdominal quadrant during laparoscopic cholecystectomy are provided for review. Surgical clips  overlie the expected location of the gallbladder fossa. Contrast injection demonstrates selective cannulation of the central aspect of the cystic duct. There is passage of contrast through the central aspect of the cystic duct with filling of a moderately dilated common bile duct. There is passage of contrast though the CBD and into the descending portion of the duodenum. There is minimal reflux of injected contrast into the common hepatic duct and central aspect of the non dilated intrahepatic biliary system. There is a persistent nonocclusive filling defect in the distal aspect of the CBD worrisome for nonocclusive choledocholithiasis. IMPRESSION: Suspected nonocclusive choledocholithiasis within the distal aspect of the CBD. Further evaluation and management with ERCP could be performed as indicated. Electronically Signed   By: Sandi Mariscal M.D.   On: 01/08/2019 12:33   DG ERCP sphincterotomy  Result Date: 01/10/2019 CLINICAL DATA:  85 year old male with a history choledocholithiasis EXAM: ERCP TECHNIQUE: Multiple spot images obtained with the fluoroscopic device and submitted for interpretation post-procedure. FLUOROSCOPY TIME:  Fluoroscopy Time:  3 minutes 2 seconds COMPARISON:  CT 01/06/2019, intraop cholangio 01/08/2019 FINDINGS: Limited intraoperative fluoroscopic spot images during ERCP. Initial image demonstrates endoscope projecting over the upper abdomen with a safety wire in place. Partial opacification of the extrahepatic biliary ducts. There is deployment of a balloon retrieval catheter. No extraluminal contrast with surgical changes of cholecystectomy. IMPRESSION: Limited images of ERCP demonstrates treatment of choledocholithiasis with deployment of a balloon catheter. Please refer to the dictated operative report for full details of intraoperative findings and procedure. Electronically Signed   By: Corrie Mckusick D.O.   On: 01/10/2019 09:06    Cardiac Studies    Echo 11/05/18 1. Left  ventricular ejection fraction, by visual estimation, is 35 to 40%. The left ventricle has moderately decreased function. There is global left ventricular hypokinesis and marked apical-basal and septal-lateral dyssynchrony. There appears to be  apical akinesis. 2. Abnormal septal motion consistent with RV pacemaker. 3. Global right ventricle has moderately reduced systolic function.The right ventricular size is normal. No increase in right ventricular wall thickness. 4. Left atrial size was severely dilated. 5. Right atrial size was moderately dilated. 6. Mild mitral valve regurgitation. Mild-moderate mitral stenosis. Peak and mean gradients are 16 and 5 mm Hg, respectively, at 70 bpm. Estimated valve area is 1.4 cm sq by the PHT method. 7. Moderate calcification of the mitral valve leaflet(s). 8. Severe mitral annular calcification. 9. Severe thickening of the mitral valve leaflet(s). 10. The tricuspid valve is normal in structure. Tricuspid valve regurgitation is mild-moderate. 11. The aortic valve is normal in structure. Aortic valve regurgitation was not visualized by color flow Doppler. 12. The pulmonic valve was normal in structure. Pulmonic valve regurgitation is  mild by color flow Doppler. 13. Mildly elevated pulmonary artery systolic pressure. 14. A pacer wire is visualized. 15. The inferior vena cava is dilated in size with >50% respiratory variability, suggesting right atrial pressure of 8 mmHg. 16. Left ventricular diastolic function could not be evaluated due to atrial fibrillation and mitral stenosis. 57. Compared to February 2020, mitral valve gradients are worse, probably due to new atrial fibrillation and increased ventricular rate.  Cardiac cath 11/05/2016 1. Severe 2 vessel CAD with continued patency of the LIMA-LAD and right radial graft-OM 2. Moderate, severely calcified, proximal circumflex stenosis with negative FFR assessment 3. Normal right heart hemodynamics  except for a prominent V wave in the PCWP tracing 4. Normal/low LVEDP  Recommend: ongoing medical therapy. Pt appears to be well-compensated from a hemodynamic perspective.  Patient Profile     84 y.o. male with a hx of CAD s/p CABG, mitral valve repair, polymorphic VT s/p ICD [employer] dual chamber, PFO closure, Persistent afib on Eliquis, Chronic systolic and diastolic HF (EF [id number]), HTN, DM, SIADH, carotid artery disease s/p R CEA 10/2018 and HLDwho is being seen today for the evaluation ofpre-operative cardiac evaluation for acute pancreatitis.  Assessment & Plan    Pt seen initially for preop evaluation -Pt was noted to be at increased risk for surgery but no cardiac workup indicated.  -Underwent lap chole on 12/9, ERCP done this morning. Currently stable.  -pt had atypcial chest pain with mildly elevated troponins in flat pattern, not consistent with ACS. Now chest pain free.  -On heparin for anticoagulation, switch back to Eliquis when OK with surgery.   Paroxysmal atrial fibrillation -Pt continues on [name], has rash with amiodarone recently. Continues on Toprol XL.  -[name] dose was reduced to 250 mcg due to prolonged QTC up to 590. QTC improved on lower dose to 482. Today is 540. K+ has been as low as 3, K= is 3.7 today. Will give KDur 40 mEq today.  -Keep K+ above 4 and Mg above 2. Mag was low 1.7 on 12/6 and mag was given. Mag was last checked, 1.8 on 12/9. I will check Mag today.  -EKG is reading atrial fibrillation, can see some p waves, ?could be sinus.  -Eliquis held for surgery>IV heparin. Resume Eliquis when OK with surgery.   AICD in place -Hx of polymorphic VT.  -As above, keep K+>4 and Mg >2.   Infrarenal abdominal aortic aneurysm 5.7 mm with no dissection -Vascular plan for surgery after this current issue resolved.   Chronic systolic heart failure -LVEF 35-40% in 10/2018. Has ICD in place -On BB at home. Takes lasix as needed at home.  -Watch fluid  status closely.  -Appears euvolemic.       For questions or updates, please contact [place] Please consult [email or web address] for contact info under        Signed, Daune Perch, NP  01/10/2019, 9:30 AM     Pt. Seen and examined. Agree with the Resident/NP/PA-C note as written. Appears to be in decompensated CHF - got another 4L of fluid today, now +8L since admit, his LVEF is 35-40%, has not been on diuretic - noted JVP to earlobe today and dyspneic when I saw him, still slow to recover from anesthesia. D/w Dr. Ree Kida, will stop IVF's, start BID IV lasix. Monitor I's/O's and daily weights.   Time Spent with Patient: I have spent a total of 35 minutes with patient reviewing hospital notes, telemetry, EKGs, labs and examining the patient  as well as establishing an assessment and plan that was discussed with the patient. > 50% of time was spent in direct patient care.  Pixie Casino, MD, [facility], [place] Director of the [facility] Diplomate of the American Board of Clinical Lipidology Attending Cardiologist  Direct Dial: [phone]  Fax: [phone]  Website:  [email or web address]

## 2019-01-10 NOTE — Progress Notes (Signed)
PROGRESS NOTE    [name]  WJX:[id number] DOB: Sep 22, 1931 DOA: 01/03/2019 PCP: Joycelyn Rua, MD   Brief Narrative:  HPI On 01/04/2019 by Dr. Midge Minium [name] is a 85 y.o. male with history of CAD status post CABG, chronic systolic heart failure status post AICD placement, A. fib on apixaban and [name] had a reaction to amiodarone recently, recent carotid endarterectomy admitted recently for CHF in October 2 months ago presents to the ER with complaint of severe abdominal pain over the last 2 days with no nausea vomiting or diarrhea.  Abdominal pain is mostly in the lower part of the abdomen.  Likely.  Denies any fever chills or chest pain or shortness of breath.  Abdominal pain is dull severe pain not associated with food.  Interim history Patient admitted for acute biliary pancreatitis, status post cholecystectomy.  Patient did have ERCP today.  Assessment & Plan   Acute biliary pancreatitis/choledocholithiasis with elevated LFTs -Patient will call us lithiasis with dilated CBD -CT scan on admission showed features concerning for acute pancreatitis with dilated CBD and gallstones with ductal enhancement concerning for infection -Initially managed by supportive care, bowel rest and IV fluids -[facility] and general surgery consulted -Patient was placed on Zosyn -Repeat CT abdomen on 01/06/2019 showed acute pancreatitis without necrosis, distended gallbladder with stones and CBD dilatation up to 30 mm with question of appendicitis as well -General surgery performed lap cholecystectomy with IOC.  Patient noted to have questionable filling defect in the distal CBD -MRCP could not be done as patient has pacemaker -Status post ERCP today, showing choledocholithiasis.  Complete removal was accomplished by biliary sphincterotomy and balloon extraction.  GI recommended clear liquid diet for 6 hours and may advance as tolerated thereafter.  Recommended  restarting heparin on 12/12.  -Total bilirubin 4, alk phos 443 today, AST 282, ALT 321, will continue to monitor  Infrarenal abdominal aortic aneurysm -Measuring 5.7 cm no dissection -Patient will need elective vascular surgical intervention once gallstone pancreatitis issues have resolved -Vascular surgery was consulted and appreciated  Paroxysmal atrial fibrillation -Currently rate controlled -Eliquis held -On IV heparin-we will restart on 01/11/2019 -Continue [name] and metoprolol -Cardiology consulted and appreciated  Hyponatremia -Patient with recent diagnosis of SIADH -Sodium currently 132 -Continue monitor BMP  Hypokalemia -Resolved with replacement -Continue to monitor BMP  Chronic systolic heart failure -With history of AICD -Echocardiogram October 2020 showed an EF of 35 to 40% -Patient clinically euvolemic  -Cardiology consulted and appreciated  Diabetes mellitus, type II -Continue insulin sliding scale and CBG monitoring  Microcytic anemia -Vitamin B12, folate, TSH are all unremarkable and within normal limits -hemoglobin stable, currently 10.7  -Continue to monitor CBC  Generalized deconditioning -PT recommending home health  DVT Prophylaxis  heparin  Code Status: Full  Family Communication: None at bedside  Disposition Plan: Admitted.  Patient returned from ERCP today, will likely continue to monitor LFTs and advance diet as tolerated.  Possible discharge to home with home health in 1 to 2 days.  Consultants Gastroenterology General surgery  Procedures  lap cholecystectomy with IOC.  Patient noted to have questionable filling defect in the distal CBD  ERCP  Antibiotics   Anti-infectives (From admission, onward)   Start     Dose/Rate Route Frequency Ordered Stop   01/04/19 0430  piperacillin-tazobactam (ZOSYN) IVPB 3.375 g     3.375 g 12.5 mL/hr over 240 Minutes Intravenous Every 8 hours 01/04/19 0415        Subjective:  [name] seen and examined today.  Patient feels as if he has had 3 glasses of scotch today.  Feels his abdominal pain is tolerable.  Denies current nausea or vomiting.  Denies current chest pain, shortness of breath, dizziness or headache.  Does have abdominal pain when coughing and moving. Objective:   Vitals:   01/10/19 0840 01/10/19 0850 01/10/19 0900 01/10/19 0910  BP: (!) 143/88 (!) 140/97 (!) 154/90 125/77  Pulse: 90 94 95 95  Resp: (!) 23 20 (!) 21 17  Temp: 98.8 F (37.1 C)     TempSrc: Oral     SpO2: 100% 99% 100% 92%  Weight:      Height:        Intake/Output Summary (Last 24 hours) at 01/10/2019 1006 Last data filed at 01/10/2019 0845 Gross per 24 hour  Intake 4954.27 ml  Output 150 ml  Net 4804.27 ml   Filed Weights   01/04/19 0242 01/05/19 0500 01/06/19 0500  Weight: 92.9 kg 92.9 kg 92.9 kg   Exam  General: Well developed, well nourished, NAD, appears stated age  HEENT: NCAT, mucous membranes moist.   Cardiovascular: S1 S2 auscultated, irregular, SEM  Respiratory: Clear to auscultation bilaterally   Abdomen: Soft, nontender, nondistended, + bowel sounds  Extremities: warm dry without cyanosis clubbing. Trace LUE edema.   Neuro: AAOx3, nonfocal  Psych: Pleasant, appropriate mood and affect  Data Reviewed: I have personally reviewed following labs and imaging studies  CBC: Recent Labs  Lab 01/03/19 1548 01/04/19 0500 01/06/19 0318 01/07/19 0319 01/08/19 0404 01/09/19 0500 01/10/19 0451  WBC 9.0 11.8* 12.4* 8.3 7.8 6.3 5.5  NEUTROABS 7.8* 10.3*  --   --  6.3  --   --   HGB 14.4 12.7* 12.1* 10.6* 10.6* 10.7* 10.7*  HCT 43.6 37.6* 35.0* 30.5* 30.8* 31.9* 31.6*  MCV 103.3* 102.5* 100.6* 101.0* 103.0* 102.2* 102.6*  PLT 138* 123* 121* 125* 131* 127* 139*   Basic Metabolic Panel: Recent Labs  Lab 01/04/19 0500 01/05/19 0343 01/06/19 0318 01/07/19 0319 01/08/19 0404 01/09/19 0500 01/10/19 0451  NA 131* 129* 132* 131* 133* 133* 132*  K 4.6  4.2 3.7 3.3* 3.0* 3.2* 3.7  CL 99 97* 97* 99 101 99 98  CO2 24 24 19* 22 22 22 24   GLUCOSE 133* 189* 169* 147* 157* 167* 179*  BUN 13 18 16 14 11 8  6*  CREATININE 0.92 0.98 1.07 0.79 0.75 0.76 0.82  CALCIUM 8.4* 8.0* 8.1* 7.9* 7.9* 8.0* 8.2*  MG 1.9 1.7 2.1 1.9 1.8  --   --    GFR: Estimated Creatinine Clearance: 74.4 mL/min (by C-G formula based on SCr of 0.82 mg/dL). Liver Function Tests: Recent Labs  Lab 01/06/19 0318 01/07/19 0319 01/08/19 0404 01/09/19 0500 01/10/19 0451  AST 43* 37 33 472* 282*  ALT 73* 54* 48* 339* 321*  ALKPHOS 144* 109 99 322* 443*  BILITOT 3.1* 1.9* 1.3* 3.2* 4.0*  PROT 6.3* 5.4* 5.2* 5.2* 5.5*  ALBUMIN 2.7* 2.4* 2.4* 2.4* 2.4*   Recent Labs  Lab 01/03/19 1548 01/04/19 0500 01/07/19 0319  LIPASE 2,777* 960* 31   No results for input(s): AMMONIA in the last 168 hours. Coagulation Profile: Recent Labs  Lab 01/04/19 0500  INR 1.3*   Cardiac Enzymes: No results for input(s): CKTOTAL, CKMB, CKMBINDEX, TROPONINI in the last 168 hours. BNP (last 3 results) No results for input(s): PROBNP in the last 8760 hours. HbA1C: No results for input(s): HGBA1C in the last 72  hours. CBG: Recent Labs  Lab 01/09/19 1134 01/09/19 1657 01/09/19 2107 01/09/19 2141 01/10/19 0949  GLUCAP 194* 209* 153* 154* 168*   Lipid Profile: No results for input(s): CHOL, HDL, LDLCALC, TRIG, CHOLHDL, LDLDIRECT in the last 72 hours. Thyroid Function Tests: Recent Labs    01/08/19 0404  TSH 2.274   Anemia Panel: Recent Labs    01/08/19 0404  VITAMINB12 888  FOLATE 32.7   Urine analysis:    Component Value Date/Time   COLORURINE YELLOW 01/03/2019 1417   APPEARANCEUR TURBID (A) 01/03/2019 1417   LABSPEC >1.030 (H) 01/03/2019 1417   PHURINE 6.0 01/03/2019 1417   GLUCOSEU 250 (A) 01/03/2019 1417   HGBUR NEGATIVE 01/03/2019 1417   BILIRUBINUR SMALL (A) 01/03/2019 1417   KETONESUR 15 (A) 01/03/2019 1417   PROTEINUR 100 (A) 01/03/2019 1417   NITRITE  NEGATIVE 01/03/2019 1417   LEUKOCYTESUR NEGATIVE 01/03/2019 1417   Sepsis Labs: @LABRCNTIP (procalcitonin:4,lacticidven:4)  ) Recent Results (from the past 240 hour(s))  Urine culture     Status: Abnormal   Collection Time: 01/03/19  2:17 PM   Specimen: Urine, Random  Result Value Ref Range Status   Specimen Description   Final    URINE, RANDOM Performed at [facility], [address] [place], [place] [address]    Special Requests   Final    NONE Performed at [facility], [address] [place], [place] [address]    Culture 20,000 COLONIES/mL ENTEROCOCCUS FAECALIS (A)  Final   Report Status 01/06/2019 FINAL  Final   Organism ID, Bacteria ENTEROCOCCUS FAECALIS (A)  Final      Susceptibility   Enterococcus faecalis - MIC*    AMPICILLIN <=2 SENSITIVE Sensitive     LEVOFLOXACIN 1 SENSITIVE Sensitive     NITROFURANTOIN 32 SENSITIVE Sensitive     VANCOMYCIN 1 SENSITIVE Sensitive     * 20,000 COLONIES/mL ENTEROCOCCUS FAECALIS  SARS CORONAVIRUS 2 (TAT 6-24 HRS) Nasopharyngeal Nasopharyngeal Swab     Status: None   Collection Time: 01/03/19  8:36 PM   Specimen: Nasopharyngeal Swab  Result Value Ref Range Status   SARS Coronavirus 2 NEGATIVE NEGATIVE Final    Comment: (NOTE) SARS-CoV-2 target nucleic acids are NOT DETECTED. The SARS-CoV-2 RNA is generally detectable in upper and lower respiratory specimens during the acute phase of infection. Negative results do not preclude SARS-CoV-2 infection, do not rule out co-infections with other pathogens, and should not be used as the sole basis for treatment or other patient management decisions. Negative results must be combined with clinical observations, patient history, and epidemiological information. The expected result is Negative. Fact Sheet for Patients: HairSlick.no Fact Sheet for Healthcare Providers: quierodirigir.com This test is not yet  approved or cleared by the [place] FDA and  has been authorized for detection and/or diagnosis of SARS-CoV-2 by FDA under an Emergency Use Authorization (EUA). This EUA will remain  in effect (meaning this test can be used) for the duration of the COVID-19 declaration under Section 56 4(b)(1) of the Act, 21 U.S.C. section 360bbb-3(b)(1), unless the authorization is terminated or revoked sooner. Performed at [facility], [address], [place], [place] [address]   C difficile quick scan w PCR reflex     Status: None   Collection Time: 01/07/19  5:56 PM   Specimen: STOOL  Result Value Ref Range Status   C Diff antigen NEGATIVE NEGATIVE Final   C Diff toxin NEGATIVE NEGATIVE Final   C  Diff interpretation No C. difficile detected.  Final    Comment: Performed at [facility], [address] [place], [place] [address]  Surgical PCR screen     Status: None   Collection Time: 01/08/19  6:51 AM   Specimen: Nasal Mucosa; Nasal Swab  Result Value Ref Range Status   MRSA, PCR NEGATIVE NEGATIVE Final   Staphylococcus aureus NEGATIVE NEGATIVE Final    Comment: (NOTE) The Xpert SA Assay (FDA approved for NASAL specimens in patients 48 years of age and older), is one component of a comprehensive surveillance program. It is not intended to diagnose infection nor to guide or monitor treatment. Performed at [facility], [address] [place], [place] [address]       Radiology Studies: DG Cholangiogram Operative  Result Date: 01/08/2019 CLINICAL DATA:  Intraoperative cholangiogram during laparoscopic cholecystectomy. EXAM: INTRAOPERATIVE CHOLANGIOGRAM FLUOROSCOPY TIME:  22 seconds COMPARISON:  CT abdomen pelvis - 01/06/2019 FINDINGS: Intraoperative cholangiographic images of the right upper abdominal quadrant during laparoscopic cholecystectomy are provided for review. Surgical clips overlie the expected location of the gallbladder fossa. Contrast injection  demonstrates selective cannulation of the central aspect of the cystic duct. There is passage of contrast through the central aspect of the cystic duct with filling of a moderately dilated common bile duct. There is passage of contrast though the CBD and into the descending portion of the duodenum. There is minimal reflux of injected contrast into the common hepatic duct and central aspect of the non dilated intrahepatic biliary system. There is a persistent nonocclusive filling defect in the distal aspect of the CBD worrisome for nonocclusive choledocholithiasis. IMPRESSION: Suspected nonocclusive choledocholithiasis within the distal aspect of the CBD. Further evaluation and management with ERCP could be performed as indicated. Electronically Signed   By: Simonne Come M.D.   On: 01/08/2019 12:33   DG ERCP sphincterotomy  Result Date: 01/10/2019 CLINICAL DATA:  85 year old male with a history choledocholithiasis EXAM: ERCP TECHNIQUE: Multiple spot images obtained with the fluoroscopic device and submitted for interpretation post-procedure. FLUOROSCOPY TIME:  Fluoroscopy Time:  3 minutes 2 seconds COMPARISON:  CT 01/06/2019, intraop cholangio 01/08/2019 FINDINGS: Limited intraoperative fluoroscopic spot images during ERCP. Initial image demonstrates endoscope projecting over the upper abdomen with a safety wire in place. Partial opacification of the extrahepatic biliary ducts. There is deployment of a balloon retrieval catheter. No extraluminal contrast with surgical changes of cholecystectomy. IMPRESSION: Limited images of ERCP demonstrates treatment of choledocholithiasis with deployment of a balloon catheter. Please refer to the dictated operative report for full details of intraoperative findings and procedure. Electronically Signed   By: Gilmer Mor D.O.   On: 01/10/2019 09:06     Scheduled Meds: . acetaminophen  1,000 mg Oral Q8H  . arformoterol  15 mcg Nebulization BID  . dofetilide  250 mcg Oral  BID  . insulin aspart  0-5 Units Subcutaneous QHS  . insulin aspart  0-9 Units Subcutaneous TID WC  . metoprolol succinate  50 mg Oral Daily  . pantoprazole (PROTONIX) IV  40 mg Intravenous Q24H  . sodium chloride flush  10-40 mL Intracatheter Q12H  . umeclidinium bromide  1 puff Inhalation Daily   Continuous Infusions: . sodium chloride 50 mL/hr at 01/10/19 0506  . heparin    . lactated ringers 10 mL/hr at 01/08/19 0925  . piperacillin-tazobactam (ZOSYN)  IV 3.375 g (01/10/19 0509)     LOS: 7 days   Time Spent in minutes  30 minutes  Jissel Slavens D.O. on 01/10/2019 at 10:06 AM  Between 7am to 7pm - Please see pager noted on [email or web address]  After 7pm go to [email or web address]  And look for the night coverage person covering for me after hours  [facility] Office  [phone]

## 2019-01-10 NOTE — TOC Initial Note (Signed)
Transition of Care [facility]) - Initial/Assessment Note    Patient Details  Name: [name] MRN: [id number] Date of Birth: 04/24/1931  Transition of Care [facility]) CM/SW Contact:    Marilu Favre, RN Phone Number: 01/10/2019, 11:52 AM  Clinical Narrative:                 Confirmed face sheet information. Patient from home with wife. Provided home health choice. Patient would like [facility]. Called Cassie with [facility]   Expected Discharge Plan: [place] Barriers to Discharge: Continued Medical Work up   Patient Goals and CMS Choice Patient states their goals for this hospitalization and ongoing recovery are:: to return to home CMS Medicare.gov Compare Post Acute Care list provided to:: Patient Choice offered to / list presented to : Patient  Expected Discharge Plan and Services Expected Discharge Plan: [place]   Discharge Planning Services: CM Consult Post Acute Care Choice: [place] arrangements for the past 2 months: Single Family Home                 DME Arranged: N/A         HH Arranged: PT HH Agency: NA        Prior Living Arrangements/Services Living arrangements for the past 2 months: Single Family Home Lives with:: Spouse Patient language and need for interpreter reviewed:: Yes Do you feel safe going back to the place where you live?: Yes      Need for Family Participation in Patient Care: Yes (Comment) Care giver support system in place?: Yes (comment)   Criminal Activity/Legal Involvement Pertinent to Current Situation/Hospitalization: No - Comment as needed  Activities of Daily Living      Permission Sought/Granted   Permission granted to share information with : Yes, Verbal Permission Granted     Permission granted to share info w AGENCY: [facility]        Emotional Assessment Appearance:: Appears stated age Attitude/Demeanor/Rapport: Engaged Affect (typically observed):  Accepting Orientation: : Oriented to Situation, Oriented to  Time, Oriented to Place, Oriented to Self Alcohol / Substance Use: Not Applicable Psych Involvement: No (comment)  Admission diagnosis:  Gallstones [K80.20] Generalized abdominal pain [R10.84] LFT elevation [R79.89] Abdominal aortic aneurysm (AAA) without rupture (HCC) [I71.4] Other acute pancreatitis without infection or necrosis [K85.80] Patient Active Problem List   Diagnosis Date Noted  . QT prolongation   . LFT elevation 01/04/2019  . Gallstones 01/04/2019  . Abdominal aortic aneurysm (AAA) without rupture ([place]) 01/04/2019  . Acute pancreatitis 01/03/2019  . CHF (congestive heart failure) ([place]) 11/12/2018  . AICD (automatic cardioverter/defibrillator) present   . History of seasonal allergies   . Chronic combined systolic (congestive) and diastolic (congestive) heart failure ([place])   . Acute on chronic combined systolic (congestive) and diastolic (congestive) heart failure ([place])   . Congestive heart failure (CHF) ([place]) 11/11/2018  . Amaurosis fugax of right eye 11/05/2018  . Preop cardiovascular exam   . Type 2 diabetes mellitus with complication, without long-term current use of insulin ([place]) 11/04/2018  . TIA (transient ischemic attack) 11/04/2018  . CAD (coronary artery disease) 11/04/2018  . Hyponatremia 11/04/2018  . Closed fracture of right iliac crest ([place])   . Syncope 03/04/2018  . Pelvic fracture ([place]) 03/04/2018  . Asthma-COPD overlap syndrome ([place]) 02/22/2018  . Cough 02/22/2018  . Orthostatic presyncope 08/01/2010  . Coronary artery disease with exertional angina ([place]) 08/01/2010  . Ventricular tachycardia, polymorphic ([place])   .  ATRIAL FIBRILLATION 06/15/2008  . PREMATURE VENTRICULAR CONTRACTIONS 04/23/2008  . RAYNAUD'S DISEASE 04/23/2008  . Automatic implantable cardioverter-defibrillator in situ 04/23/2008   PCP:  [name], MD Pharmacy:   [facility] [place] - [address] [place] [address] Phone: [phone] Fax: [phone]     Social Determinants of Health (SDOH) Interventions    Readmission Risk Interventions No flowsheet data found.

## 2019-01-10 NOTE — Progress Notes (Addendum)
[place] for Heparin / Eliquis on hold Indication: atrial fibrillation  Allergies  Allergen Reactions  . Bee Venom Anaphylaxis  . Latex Other (See Comments)    REDNESS AT REACTION SITE.  Marland Kitchen Metformin Diarrhea  . Rivaroxaban Other (See Comments)    Headaches, blurred vision  . Sotalol Other (See Comments)    "BLUE TOES"- cut his circulation off  . Warfarin Sodium Other (See Comments)    REACTION: migraine headaches/vision impariment    Patient Measurements: Height: 6\' 1"  (185.4 cm) Weight: 204 lb 12.9 oz (92.9 kg) IBW/kg (Calculated) : 79.9  Labs: Recent Labs    01/08/19 0404 01/09/19 0500 01/10/19 0451  HGB 10.6* 10.7* 10.7*  HCT 30.8* 31.9* 31.6*  PLT 131* 127* 139*  APTT 79* 36 31  HEPARINUNFRC 0.14* <0.10* <0.10*  CREATININE 0.75 0.76 0.82    Estimated Creatinine Clearance: 74.4 mL/min (by C-G formula based on SCr of 0.82 mg/dL).  Assessment: 85 y.o. male admitted with abdominal pain, h/o Afib and Eliquis on hold now on heparin  S/p ERCP this AM - > heparin to resume in 6 hours at 1600 pm  Goal of Therapy:  Heparin level 0.3-0.7 units/ml  APTT 66-102 Monitor platelets by anticoagulation protocol: Yes   Plan:  Resume heparin at 1150 units / hr 8 hour heparin level / PTT  Thank you Anette Guarneri, PharmD  Please check AMION for all [place] numbers  01/10/2019 9:41 AM

## 2019-01-10 NOTE — Anesthesia Postprocedure Evaluation (Signed)
Anesthesia Post Note  Patient: [name]  Procedure(s) Performed: ENDOSCOPIC RETROGRADE CHOLANGIOPANCREATOGRAPHY (ERCP) (N/A ) SPHINCTEROTOMY REMOVAL OF STONES     Patient location during evaluation: Endoscopy Anesthesia Type: General Level of consciousness: awake and alert Pain management: pain level controlled Vital Signs Assessment: post-procedure vital signs reviewed and stable Respiratory status: spontaneous breathing, nonlabored ventilation and respiratory function stable Cardiovascular status: blood pressure returned to baseline and stable Postop Assessment: no apparent nausea or vomiting Anesthetic complications: no    Last Vitals:  Vitals:   01/10/19 0900 01/10/19 0910  BP: (!) 154/90 125/77  Pulse: 95 95  Resp: (!) 21 17  Temp:    SpO2: 100% 92%    Last Pain:  Vitals:   01/10/19 0910  TempSrc:   PainSc: 0-No pain                 Lidia Collum

## 2019-01-10 NOTE — Progress Notes (Signed)
[name] 7:44 AM  Subjective: Patient looks better than I thought after my discussion with my partner Dr. Alessandra Bevels and reviewing his hospital records and is only having mild discomfort and no new complaints Objective: Vital signs stable afebrile no acute distress exam please see preassessment LFTs increased CBC okay evaluation IOC reviewed CT reviewed Assessment: Probable retained stone on Intra-Op cholangiogram  Plan: We rediscussed ERCP including the risks and success rate and will proceed today with anesthesia assistance  [facility]  office [phone] After 5PM or if no answer call [phone]

## 2019-01-10 NOTE — Op Note (Signed)
[facility] Patient Name: [name] Procedure Date : 01/10/2019 MRN: [id number] Attending MD: Clarene Essex , MD Date of Birth: 1931-08-05 CSN: [id number] Age: 85 Admit Type: Inpatient Procedure:                ERCP Indications:              Bile duct stone(s), For therapy of bile duct                            stone(s) positive IOC tree of gallstone pancreatitis Providers:                Clarene Essex, MD, Carlyn Reichert, RN, Benay Pillow,                            RN, Lina Sar, Marcene Duos CRNA Referring MD:              Medicines:                General Anesthesia Complications:            No immediate complications. Estimated Blood Loss:     Estimated blood loss: none. Procedure:                Pre-Anesthesia Assessment:                           - Prior to the procedure, a History and Physical                            was performed, and patient medications and                            allergies were reviewed. The patient's tolerance of                            previous anesthesia was also reviewed. The risks                            and benefits of the procedure and the sedation                            options and risks were discussed with the patient.                            All questions were answered, and informed consent                            was obtained. Prior Anticoagulants: The patient has                            taken heparin, last dose was day of procedure. ASA                            Grade Assessment: III - A patient with severe  systemic disease. After reviewing the risks and                            benefits, the patient was deemed in satisfactory                            condition to undergo the procedure.                           After obtaining informed consent, the scope was                            passed under direct vision. Throughout the                             procedure, the patient's blood pressure, pulse, and                            oxygen saturations were monitored continuously. The                            [id number]) Olympus Duodensocope was                            introduced through the mouth, and used to inject                            contrast into and used to cannulate the bile duct.                            The ERCP was accomplished without difficulty. The                            patient tolerated the procedure well. Scope In: Scope Out: Findings:      The major papilla was bulging. Deep selective cannulation was fairly       readily obtained without any pancreatic duct injection or wire       advancement and dye was injected and the wire was advanced into the       intrahepatic and we proceeded with a biliary sphincterotomy was made       with a Hydratome sphincterotome using ERBE electrocautery. There was no       post-sphincterotomy bleeding. We proceeded until we had adequate biliary       drainage and could get the fully bowed sphincterotome easily in and out       of the duct and to discover objects, the biliary tree was swept with an       adjustable 12- 15 mm balloon starting at the bifurcation. The 12 mm       balloon passed readily through the patent sphincterotomy site and A few       stones were removed. We then pulled the 15 mm balloon through the pain       sphincterotomy site 2 times and no stones remained. We proceeded with an       occlusion cholangiogram which was negative for any retained  stones and       there was adequate biliary drainage and the scope was removed and the       patient tolerated the procedure well Impression:               - The major papilla appeared to be bulging.                           - Choledocholithiasis was found. Complete removal                            was accomplished by biliary sphincterotomy and                            balloon extraction.                            - A biliary sphincterotomy was performed.                           - The biliary tree was swept. No pancreatic duct                            injection or wire advancement was done throughout                            the procedure Recommendation:           - Clear liquid diet for 6 hours. If doing well can                            slowly advance diet                           - Resume heparin at prior dose tomorrow. Unless                            high risk then can restart without loading dose in                            6 hours                           - Return to GI clinic PRN.                           - Telephone GI clinic if symptomatic PRN.                           - Check liver enzymes (AST, ALT, alkaline                            phosphatase, bilirubin) tomorrow. And as an                            outpatient can follow back to normal Procedure Code(s):        ---  Professional ---                           [phone], Endoscopic retrograde                            cholangiopancreatography (ERCP); with removal of                            calculi/debris from biliary/pancreatic duct(s)                           43262, Endoscopic retrograde                            cholangiopancreatography (ERCP); with                            sphincterotomy/papillotomy Diagnosis Code(s):        --- Professional ---                           K80.50, Calculus of bile duct without cholangitis                            or cholecystitis without obstruction                           K83.8, Other specified diseases of biliary tract CPT copyright 2019 American [employer] Association. All rights reserved. The codes documented in this report are preliminary and upon coder review may  be revised to meet current compliance requirements. Clarene Essex, MD 01/10/2019 8:38:20 AM This report has been signed electronically. Number of Addenda: 0

## 2019-01-10 NOTE — Anesthesia Procedure Notes (Signed)
Procedure Name: Intubation Date/Time: 01/10/2019 7:48 AM Performed by: Amadeo Garnet, CRNA Pre-anesthesia Checklist: Patient identified, Emergency Drugs available, Suction available and Patient being monitored Patient Re-evaluated:Patient Re-evaluated prior to induction Oxygen Delivery Method: Circle system utilized Preoxygenation: Pre-oxygenation with 100% oxygen Induction Type: IV induction Ventilation: Mask ventilation without difficulty Laryngoscope Size: Mac and 4 Grade View: Grade I Tube type: Oral Tube size: 7.5 mm Number of attempts: 1 Airway Equipment and Method: Stylet Placement Confirmation: ETT inserted through vocal cords under direct vision,  positive ETCO2 and breath sounds checked- equal and bilateral Secured at: 22 cm Tube secured with: Tape Dental Injury: Teeth and Oropharynx as per pre-operative assessment

## 2019-01-10 NOTE — Anesthesia Preprocedure Evaluation (Signed)
Anesthesia Evaluation  Patient identified by MRN, date of birth, ID band Patient awake    Reviewed: Allergy & Precautions, NPO status , Patient's Chart, lab work & pertinent test results  Airway Mallampati: II  TM Distance: >3 FB Neck ROM: Full    Dental no notable dental hx. (+) Chipped, Poor Dentition, Dental Advisory Given,    Pulmonary asthma , COPD, former smoker,  Former smoker 32 pack years, quit 1973   Pulmonary exam normal breath sounds clear to auscultation       Cardiovascular + angina + CAD and +CHF  Normal cardiovascular exam+ dysrhythmias Atrial Fibrillation + Cardiac Defibrillator  Rhythm:Regular Rate:Normal  CAD s/p CABG, mitral valve repair, polymorphic VT s/p ICD Medtronic dual chamber, PFO closure, Persistent afib on Eliquis, Chronic systolic and diastolic HF (EF [id number]), HTN, DM, SIADH, carotid artery disease s/p R CEA 10/2018 and HLD   Seen by cardiology for clearance, considered high risk   Neuro/Psych  Headaches, TIAnegative psych ROS   GI/Hepatic Neg liver ROS, gallstone pancretitis, possible appendicitis. Unable to have MRCP due to AICD in place   Endo/Other  diabetes, Type 2, Insulin Dependent  Renal/GU negative Renal ROS  negative genitourinary   Musculoskeletal negative musculoskeletal ROS (+)   Abdominal (+) + obese,   Peds  Hematology negative hematology ROS (+)   Anesthesia Other Findings S/p lap chole with residual CBD stone--for ERCP  Reproductive/Obstetrics negative OB ROS                             Anesthesia Physical  Anesthesia Plan  ASA: IV  Anesthesia Plan: General   Post-op Pain Management:    Induction: Intravenous  PONV Risk Score and Plan: 2 and Ondansetron, Dexamethasone and Treatment may vary due to age or medical condition  Airway Management Planned: Oral ETT  Additional Equipment: None  Intra-op Plan:   Post-operative Plan:  Extubation in OR  Informed Consent: I have reviewed the patients History and Physical, chart, labs and discussed the procedure including the risks, benefits and alternatives for the proposed anesthesia with the patient or authorized representative who has indicated his/her understanding and acceptance.     Dental advisory given  Plan Discussed with:   Anesthesia Plan Comments:         Anesthesia Quick Evaluation

## 2019-01-10 NOTE — Progress Notes (Signed)
Day of Surgery    CC: Abdominal pain  Subjective: Patient is doing well this a.m.  He is still rather dizzy from his anesthesia for his ERCP.  Notes not currently available but patient was told he got all the stones out.  His port sites all look fine.  Objective: Vital signs in last 24 hours: Temp:  [97.5 F (36.4 C)-98.3 F (36.8 C)] 98 F (36.7 C) (12/11 0701) Pulse Rate:  [82-101] 101 (12/11 0701) Resp:  [15-18] 15 (12/11 0701) BP: (111-143)/(83-89) 143/89 (12/11 0701) SpO2:  [93 %-96 %] 95 % (12/11 0701) Last BM Date: 01/08/19 560 PO 3800 IV 350 urine recorded Afebrile vital signs are stable and his heart rate was up to 101 this AM. K+ 3.7 creatinine 0.82;  LFTs are still elevated WBC 5.5 Hemoglobin 10.7/hematocrit 31.6 Platelets 130 9K Intake/Output from previous day: 12/10 0701 - 12/11 0700 In: 4354.3 [P.O.:560; I.V.:3264.2; IV Piggyback:530.1] Out: 350 [Urine:350] Intake/Output this shift: No intake/output data recorded.  General appearance: alert, cooperative and no distress Resp: clear to auscultation bilaterally GI: Soft, sore, port sites all look good.  Tegaderm dressings were removed Steri-Strips remain in place.  Lab Results:  Recent Labs    01/09/19 0500 01/10/19 0451  WBC 6.3 5.5  HGB 10.7* 10.7*  HCT 31.9* 31.6*  PLT 127* 139*    BMET Recent Labs    01/09/19 0500 01/10/19 0451  NA 133* 132*  K 3.2* 3.7  CL 99 98  CO2 22 24  GLUCOSE 167* 179*  BUN 8 6*  CREATININE 0.76 0.82  CALCIUM 8.0* 8.2*   PT/INR No results for input(s): LABPROT, INR in the last 72 hours.  Recent Labs  Lab 01/06/19 0318 01/07/19 0319 01/08/19 0404 01/09/19 0500 01/10/19 0451  AST 43* 37 33 472* 282*  ALT 73* 54* 48* 339* 321*  ALKPHOS 144* 109 99 322* 443*  BILITOT 3.1* 1.9* 1.3* 3.2* 4.0*  PROT 6.3* 5.4* 5.2* 5.2* 5.5*  ALBUMIN 2.7* 2.4* 2.4* 2.4* 2.4*     Lipase     Component Value Date/Time   LIPASE 31 01/07/2019 0319     Medications: .  [MAR Hold] acetaminophen  1,000 mg Oral Q8H  . [MAR Hold] arformoterol  15 mcg Nebulization BID  . [MAR Hold] dofetilide  250 mcg Oral BID  . [MAR Hold] insulin aspart  0-5 Units Subcutaneous QHS  . [MAR Hold] insulin aspart  0-9 Units Subcutaneous TID WC  . [MAR Hold] metoprolol succinate  50 mg Oral Daily  . [MAR Hold] pantoprazole (PROTONIX) IV  40 mg Intravenous Q24H  . [MAR Hold] sodium chloride flush  10-40 mL Intracatheter Q12H  . [MAR Hold] umeclidinium bromide  1 puff Inhalation Daily    Assessment/Plan Atypical chest pain Chronic systolic congestive heart failure EF 35-40% - cardiology following: Class III risk10.1% risk of death/MI/cardiac arrest; OK for surgery Paroxysmal atrial fibrillation-rate control focusing/Toprol AICD Chronic anticoagulation on Eliquis Right CE 11/07/18 Infrarenal abdominal aortic aneurysm 5.7 cm Hyponatremia with history of SIADH Type 2 diabetes 5.7 cm abdominal aortic aneurysm-VascularSurgeryfollowing Hx tobacco use    Gallstone pancreatitis/Probable choledocholithiasis/chronic appendicitis Laparoscopic cholecystectomy with intraoperative cholangiogram, appendectomy, 01/08/2019 Dr. Donnie Mesa  -IOC positive for distal common bile duct obstruction with stone  - ERCP today  FEN:IV fluids/clear liquids ID: Zosyn12/5 >> day 7 DVT: Heparin drip on hold Follow up:TBD  Plan: Recheck labs in a.m., advance diet, mobilize.  He is ready for discharge from a surgical standpoint when he is stable. Follow-up  for our service is in the AVS.  Would continue to recommend Tylenol and oxycodone for pain relief.  Labs ordered for a.m.        LOS: 7 days    [name] 01/10/2019 Please see Amion

## 2019-01-11 LAB — COMPREHENSIVE METABOLIC PANEL
ALT: 214 U/L — ABNORMAL HIGH (ref 0–44)
AST: 107 U/L — ABNORMAL HIGH (ref 15–41)
Albumin: 2.3 g/dL — ABNORMAL LOW (ref 3.5–5.0)
Alkaline Phosphatase: 392 U/L — ABNORMAL HIGH (ref 38–126)
Anion gap: 9 (ref 5–15)
BUN: 11 mg/dL (ref 8–23)
CO2: 23 mmol/L (ref 22–32)
Calcium: 8 mg/dL — ABNORMAL LOW (ref 8.9–10.3)
Chloride: 100 mmol/L (ref 98–111)
Creatinine, Ser: 1.12 mg/dL (ref 0.61–1.24)
GFR calc Af Amer: >60 mL/min (ref >60)
GFR calc non Af Amer: 60 mL/min — ABNORMAL LOW (ref >60)
Glucose, Bld: 185 mg/dL — ABNORMAL HIGH (ref 70–99)
Potassium: 3.8 mmol/L (ref 3.5–5.1)
Sodium: 132 mmol/L — ABNORMAL LOW (ref 135–145)
Total Bilirubin: 2.9 mg/dL — ABNORMAL HIGH (ref 0.3–1.2)
Total Protein: 5.3 g/dL — ABNORMAL LOW (ref 6.5–8.1)

## 2019-01-11 LAB — GI PATHOGEN PANEL BY PCR, STOOL

## 2019-01-11 LAB — CBC WITH DIFFERENTIAL/PLATELET
Abs Immature Granulocytes: 0.11 10*3/uL — ABNORMAL HIGH (ref 0.00–0.07)
Basophils Absolute: 0 10*3/uL (ref 0.0–0.1)
Basophils Relative: 1 %
Eosinophils Absolute: 0.3 10*3/uL (ref 0.0–0.5)
Eosinophils Relative: 5 %
HCT: 30.7 % — ABNORMAL LOW (ref 39.0–52.0)
Hemoglobin: 10.4 g/dL — ABNORMAL LOW (ref 13.0–17.0)
Immature Granulocytes: 2 %
Lymphocytes Relative: 7 %
Lymphs Abs: 0.4 10*3/uL — ABNORMAL LOW (ref 0.7–4.0)
MCH: 34.3 pg — ABNORMAL HIGH (ref 26.0–34.0)
MCHC: 33.9 g/dL (ref 30.0–36.0)
MCV: 101.3 fL — ABNORMAL HIGH (ref 80.0–100.0)
Monocytes Absolute: 0.4 10*3/uL (ref 0.1–1.0)
Monocytes Relative: 8 %
Neutro Abs: 4.3 10*3/uL (ref 1.7–7.7)
Neutrophils Relative %: 77 %
Platelets: 143 10*3/uL — ABNORMAL LOW (ref 150–400)
RBC: 3.03 MIL/uL — ABNORMAL LOW (ref 4.22–5.81)
RDW: 13.4 % (ref 11.5–15.5)
WBC: 5.5 10*3/uL (ref 4.0–10.5)
nRBC: 0 % (ref 0.0–0.2)

## 2019-01-11 LAB — CBC
HCT: 31.2 % — ABNORMAL LOW (ref 39.0–52.0)
Hemoglobin: 10.8 g/dL — ABNORMAL LOW (ref 13.0–17.0)
MCH: 34.6 pg — ABNORMAL HIGH (ref 26.0–34.0)
MCHC: 34.6 g/dL (ref 30.0–36.0)
MCV: 100 fL (ref 80.0–100.0)
Platelets: 151 10*3/uL (ref 150–400)
RBC: 3.12 MIL/uL — ABNORMAL LOW (ref 4.22–5.81)
RDW: 13.8 % (ref 11.5–15.5)
WBC: 5.6 10*3/uL (ref 4.0–10.5)
nRBC: 0 % (ref 0.0–0.2)

## 2019-01-11 LAB — HEPARIN LEVEL (UNFRACTIONATED)
Heparin Unfractionated: 0.27 IU/mL — ABNORMAL LOW (ref 0.30–0.70)
Heparin Unfractionated: 0.2 IU/mL — ABNORMAL LOW (ref 0.30–0.70)
Heparin Unfractionated: <0.1 IU/mL — ABNORMAL LOW (ref 0.30–0.70)

## 2019-01-11 LAB — GLUCOSE, CAPILLARY
Glucose-Capillary: 175 mg/dL — ABNORMAL HIGH (ref 70–99)
Glucose-Capillary: 248 mg/dL — ABNORMAL HIGH (ref 70–99)
Glucose-Capillary: 212 mg/dL — ABNORMAL HIGH (ref 70–99)
Glucose-Capillary: 235 mg/dL — ABNORMAL HIGH (ref 70–99)

## 2019-01-11 LAB — APTT: aPTT: 112 seconds — ABNORMAL HIGH (ref 24–36)

## 2019-01-11 MED ORDER — POTASSIUM CHLORIDE CRYS ER 20 MEQ PO TBCR
40.0000 meq | EXTENDED_RELEASE_TABLET | Freq: Once | ORAL | Status: AC
  Administered 2019-01-11: 40 meq via ORAL
  Filled 2019-01-11: qty 2

## 2019-01-11 MED ORDER — PANTOPRAZOLE SODIUM 40 MG PO TBEC
40.0000 mg | DELAYED_RELEASE_TABLET | Freq: Every day | ORAL | Status: DC
  Administered 2019-01-12 – 2019-01-14 (×3): 40 mg via ORAL
  Filled 2019-01-11 (×3): qty 1

## 2019-01-11 MED ORDER — MAGNESIUM SULFATE 2 GM/50ML IV SOLN
2.0000 g | Freq: Once | INTRAVENOUS | Status: AC
  Administered 2019-01-11: 2 g via INTRAVENOUS
  Filled 2019-01-11: qty 50

## 2019-01-11 NOTE — Progress Notes (Signed)
Patient ID: EMYR PAPALEO, male   DOB: 09/18/1931, 85 y.o.   MRN: [id number] [name] [facility] Surgery Progress Note:   1 Day Post-Op  Subjective: Mental status is alert and talkative.  Complaining of swelling in extremities and he is only taking clear liquids Objective: Vital signs in last 24 hours: Temp:  [97.8 F (36.6 C)-98.3 F (36.8 C)] 97.8 F (36.6 C) (12/12 0444) Pulse Rate:  [77-95] 80 (12/12 0444) Resp:  [16-21] 16 (12/12 0444) BP: (112-154)/(67-97) 121/69 (12/12 0444) SpO2:  [92 %-100 %] 96 % (12/12 0444)  Intake/Output from previous day: 12/11 0701 - 12/12 0700 In: 1363.7 [P.O.:60; I.V.:1261.9; IV Piggyback:41.8] Out: 400 [Urine:400] Intake/Output this shift: No intake/output data recorded.  Physical Exam: Work of breathing is not labored.  Minimal abdominal pain.    Lab Results:  Results for orders placed or performed during the hospital encounter of 01/03/19 (from the past 48 hour(s))  Glucose, capillary     Status: Abnormal   Collection Time: 01/09/19 11:34 AM  Result Value Ref Range   Glucose-Capillary 194 (H) 70 - 99 mg/dL  Glucose, capillary     Status: Abnormal   Collection Time: 01/09/19  4:57 PM  Result Value Ref Range   Glucose-Capillary 209 (H) 70 - 99 mg/dL   Comment 1 Notify RN    Comment 2 Document in Chart   Glucose, capillary     Status: Abnormal   Collection Time: 01/09/19  9:07 PM  Result Value Ref Range   Glucose-Capillary 153 (H) 70 - 99 mg/dL  Glucose, capillary     Status: Abnormal   Collection Time: 01/09/19  9:41 PM  Result Value Ref Range   Glucose-Capillary 154 (H) 70 - 99 mg/dL  Heparin level     Status: Abnormal   Collection Time: 01/10/19  4:51 AM  Result Value Ref Range   Heparin Unfractionated <0.10 (L) 0.30 - 0.70 IU/mL    Comment: REPEATED TO VERIFY (NOTE) If heparin results are below expected values, and patient dosage has  been confirmed, suggest follow up testing of antithrombin III levels. Performed at [facility], [place] [address] [place], [place] [address]   APTT     Status: None   Collection Time: 01/10/19  4:51 AM  Result Value Ref Range   aPTT 31 24 - 36 seconds    Comment: Performed at [facility] [address] [place], [place] [address]  Comprehensive metabolic panel     Status: Abnormal   Collection Time: 01/10/19  4:51 AM  Result Value Ref Range   Sodium 132 (L) 135 - 145 mmol/L   Potassium 3.7 3.5 - 5.1 mmol/L   Chloride 98 98 - 111 mmol/L   CO2 24 22 - 32 mmol/L   Glucose, Bld 179 (H) 70 - 99 mg/dL   BUN 6 (L) 8 - 23 mg/dL   Creatinine, Ser 0.82 0.61 - 1.24 mg/dL   Calcium 8.2 (L) 8.9 - 10.3 mg/dL   Total Protein 5.5 (L) 6.5 - 8.1 g/dL   Albumin 2.4 (L) 3.5 - 5.0 g/dL   AST 282 (H) 15 - 41 U/L   ALT 321 (H) 0 - 44 U/L   Alkaline Phosphatase 443 (H) 38 - 126 U/L   Total Bilirubin 4.0 (H) 0.3 - 1.2 mg/dL   GFR calc non Af Amer >60 >60 mL/min   GFR calc Af Amer >60 >60 mL/min   Anion gap 10 5 - 15    Comment:  Performed at [facility], [place] [address] [place], [place] [address]  CBC9     Status: Abnormal   Collection Time: 01/10/19  4:51 AM  Result Value Ref Range   WBC 5.5 4.0 - 10.5 K/uL   RBC 3.08 (L) 4.22 - 5.81 MIL/uL   Hemoglobin 10.7 (L) 13.0 - 17.0 g/dL   HCT 31.6 (L) 39.0 - 52.0 %   MCV 102.6 (H) 80.0 - 100.0 fL   MCH 34.7 (H) 26.0 - 34.0 pg   MCHC 33.9 30.0 - 36.0 g/dL   RDW 14.1 11.5 - 15.5 %   Platelets 139 (L) 150 - 400 K/uL   nRBC 0.0 0.0 - 0.2 %    Comment: Performed at [facility] [address] [place], [place] [address]  Glucose, capillary     Status: Abnormal   Collection Time: 01/10/19  9:49 AM  Result Value Ref Range   Glucose-Capillary 168 (H) 70 - 99 mg/dL  Magnesium     Status: None   Collection Time: 01/10/19 11:15 AM  Result Value Ref Range   Magnesium 1.8 1.7 - 2.4 mg/dL    Comment: Performed at [facility], [place] [address], [place], [place] [address]  Glucose, capillary     Status: Abnormal    Collection Time: 01/10/19 12:04 PM  Result Value Ref Range   Glucose-Capillary 184 (H) 70 - 99 mg/dL  Glucose, capillary     Status: Abnormal   Collection Time: 01/10/19  4:59 PM  Result Value Ref Range   Glucose-Capillary 169 (H) 70 - 99 mg/dL  Glucose, capillary     Status: Abnormal   Collection Time: 01/10/19  8:43 PM  Result Value Ref Range   Glucose-Capillary 172 (H) 70 - 99 mg/dL  Heparin level (unfractionated)     Status: Abnormal   Collection Time: 01/11/19  1:36 AM  Result Value Ref Range   Heparin Unfractionated 0.27 (L) 0.30 - 0.70 IU/mL    Comment: (NOTE) If heparin results are below expected values, and patient dosage has  been confirmed, suggest follow up testing of antithrombin III levels. Performed at [facility], [place] [address], [place], [place] [address]   APTT     Status: Abnormal   Collection Time: 01/11/19  1:36 AM  Result Value Ref Range   aPTT 112 (H) 24 - 36 seconds    Comment:        IF BASELINE aPTT IS ELEVATED, SUGGEST PATIENT RISK ASSESSMENT BE USED TO DETERMINE APPROPRIATE ANTICOAGULANT THERAPY. Performed at [facility], [place] [address] [place], [place] [address]   Comprehensive metabolic panel once     Status: Abnormal   Collection Time: 01/11/19  5:00 AM  Result Value Ref Range   Sodium 132 (L) 135 - 145 mmol/L   Potassium 3.8 3.5 - 5.1 mmol/L   Chloride 100 98 - 111 mmol/L   CO2 23 22 - 32 mmol/L   Glucose, Bld 185 (H) 70 - 99 mg/dL   BUN 11 8 - 23 mg/dL   Creatinine, Ser 1.12 0.61 - 1.24 mg/dL   Calcium 8.0 (L) 8.9 - 10.3 mg/dL   Total Protein 5.3 (L) 6.5 - 8.1 g/dL   Albumin 2.3 (L) 3.5 - 5.0 g/dL   AST 107 (H) 15 - 41 U/L   ALT 214 (H) 0 - 44 U/L   Alkaline Phosphatase 392 (H) 38 - 126 U/L   Total Bilirubin 2.9 (H) 0.3 - 1.2  mg/dL   GFR calc non Af Amer 60 (L) >60 mL/min   GFR calc Af Amer >60 >60 mL/min   Anion gap 9 5 - 15    Comment: Performed at [facility] [address], [place], [place] [address]  CBC  with Differential     Status: Abnormal   Collection Time: 01/11/19  5:00 AM  Result Value Ref Range   WBC 5.5 4.0 - 10.5 K/uL   RBC 3.03 (L) 4.22 - 5.81 MIL/uL   Hemoglobin 10.4 (L) 13.0 - 17.0 g/dL   HCT 30.7 (L) 39.0 - 52.0 %   MCV 101.3 (H) 80.0 - 100.0 fL   MCH 34.3 (H) 26.0 - 34.0 pg   MCHC 33.9 30.0 - 36.0 g/dL   RDW 13.4 11.5 - 15.5 %   Platelets 143 (L) 150 - 400 K/uL   nRBC 0.0 0.0 - 0.2 %   Neutrophils Relative % 77 %   Neutro Abs 4.3 1.7 - 7.7 K/uL   Lymphocytes Relative 7 %   Lymphs Abs 0.4 (L) 0.7 - 4.0 K/uL   Monocytes Relative 8 %   Monocytes Absolute 0.4 0.1 - 1.0 K/uL   Eosinophils Relative 5 %   Eosinophils Absolute 0.3 0.0 - 0.5 K/uL   Basophils Relative 1 %   Basophils Absolute 0.0 0.0 - 0.1 K/uL   Immature Granulocytes 2 %   Abs Immature Granulocytes 0.11 (H) 0.00 - 0.07 K/uL    Comment: Performed at [facility], [address], [place], [place] [address]  Glucose, capillary     Status: Abnormal   Collection Time: 01/11/19  7:46 AM  Result Value Ref Range   Glucose-Capillary 175 (H) 70 - 99 mg/dL    Radiology/Results: DG ERCP sphincterotomy  Result Date: 01/10/2019 CLINICAL DATA:  85 year old male with a history choledocholithiasis EXAM: ERCP TECHNIQUE: Multiple spot images obtained with the fluoroscopic device and submitted for interpretation post-procedure. FLUOROSCOPY TIME:  Fluoroscopy Time:  3 minutes 2 seconds COMPARISON:  CT 01/06/2019, intraop cholangio 01/08/2019 FINDINGS: Limited intraoperative fluoroscopic spot images during ERCP. Initial image demonstrates endoscope projecting over the upper abdomen with a safety wire in place. Partial opacification of the extrahepatic biliary ducts. There is deployment of a balloon retrieval catheter. No extraluminal contrast with surgical changes of cholecystectomy. IMPRESSION: Limited images of ERCP demonstrates treatment of choledocholithiasis with deployment of a balloon catheter. Please refer to the  dictated operative report for full details of intraoperative findings and procedure. Electronically Signed   By: Corrie Mckusick D.O.   On: 01/10/2019 09:06    Anti-infectives: Anti-infectives (From admission, onward)   Start     Dose/Rate Route Frequency Ordered Stop   01/04/19 0430  piperacillin-tazobactam (ZOSYN) IVPB 3.375 g     3.375 g 12.5 mL/hr over 240 Minutes Intravenous Every 8 hours 01/04/19 0415        Assessment/Plan: Problem List: Patient Active Problem List   Diagnosis Date Noted  . Acute systolic heart failure ([place])   . QT prolongation   . LFT elevation 01/04/2019  . Gallstones 01/04/2019  . Abdominal aortic aneurysm (AAA) without rupture ([place]) 01/04/2019  . Acute pancreatitis 01/03/2019  . CHF (congestive heart failure) ([place]) 11/12/2018  . AICD (automatic cardioverter/defibrillator) present   . History of seasonal allergies   . Chronic combined systolic (congestive) and diastolic (congestive) heart failure ([place])   . Acute on chronic combined systolic (congestive) and diastolic (congestive) heart failure ([place])   . Congestive heart failure (  CHF) ([place]) 11/11/2018  . Amaurosis fugax of right eye 11/05/2018  . Preop cardiovascular exam   . Type 2 diabetes mellitus with complication, without long-term current use of insulin ([place]) 11/04/2018  . TIA (transient ischemic attack) 11/04/2018  . CAD (coronary artery disease) 11/04/2018  . Hyponatremia 11/04/2018  . Closed fracture of right iliac crest ([place])   . Syncope 03/04/2018  . Pelvic fracture ([place]) 03/04/2018  . Asthma-COPD overlap syndrome ([place]) 02/22/2018  . Cough 02/22/2018  . Orthostatic presyncope 08/01/2010  . Coronary artery disease with exertional angina ([place]) 08/01/2010  . Ventricular tachycardia, polymorphic ([place])   . ATRIAL FIBRILLATION 06/15/2008  . PREMATURE VENTRICULAR CONTRACTIONS 04/23/2008  . RAYNAUD'S DISEASE 04/23/2008  . Automatic implantable cardioverter-defibrillator in situ 04/23/2008     Recovery OK for 85 year old...but not ready for discharge.  Diet advancement pending.   1 Day Post-Op    LOS: 8 days   Matt B. Hassell Done, MD, [facility] [phone] beeper [phone]  01/11/2019 8:47 AM

## 2019-01-11 NOTE — Progress Notes (Signed)
ANTICOAGULATION CONSULT NOTE - Follow Up Consult  Pharmacy Consult for heparin Indication: atrial fibrillation   Labs: Recent Labs    01/09/19 0500 01/10/19 0451 01/11/19 0136 01/11/19 0500 01/11/19 1202  HGB 10.7* 10.7*  --  10.4*  --   HCT 31.9* 31.6*  --  30.7*  --   PLT 127* 139*  --  143*  --   APTT 36 31 112*  --   --   HEPARINUNFRC <0.10* <0.10* 0.27*  --  0.20*  CREATININE 0.76 0.82  --  1.12  --     Assessment: 85yo male admitted for acute pancreatitis. Pt was on apixaban PTA for AFib. Pt is s/p lap chole / appendectomy / ERCP on 12/11. Pharmacy was consulted to resume heparin infusion.   Heparin level is subtherapeutic at 0.20 despite rate increase to 1250 units/hr. CBC low but stable, Hgb 10.4, Plt 143.   Goal of Therapy:  Heparin level 0.3-0.7 units/ml   Plan:  Increase heparin infusion to 1400 units/hr Heparin level in 8 hours Monitor daily heparin level, CBC, and s/sx of bleeding   Berenice Bouton, PharmD PGY1 Pharmacy Resident Phone until 3:30 pm: [phone] 01/11/2019,1:22 PM

## 2019-01-11 NOTE — Progress Notes (Signed)
ANTICOAGULATION CONSULT NOTE - Follow Up Consult  Pharmacy Consult for heparin Indication: atrial fibrillation  Labs: Recent Labs    01/08/19 0404 01/09/19 0500 01/10/19 0451 01/11/19 0136  HGB 10.6* 10.7* 10.7*  --   HCT 30.8* 31.9* 31.6*  --   PLT 131* 127* 139*  --   APTT 79* 36 31  --   HEPARINUNFRC 0.14* <0.10* <0.10* 0.27*  CREATININE 0.75 0.76 0.82  --     Assessment: 85yo male subtherapeutic on heparin after resuming; no gtt issues or signs of bleeding per RN.  Goal of Therapy:  Heparin level 0.3-0.7 units/ml   Plan:  Will increase heparin gtt by 1 unit/kg/hr to 1250 units/hr and check level in 8 hours.    Wynona Neat, PharmD, BCPS  01/11/2019,3:18 AM

## 2019-01-11 NOTE — Progress Notes (Signed)
Pt started to leak out serous sanguineous on right lower incision. Re-dressed incision with gauze and tape. Will continue to monitor pt.

## 2019-01-11 NOTE — Progress Notes (Signed)
Progress Note  Patient Name: [name] Date of Encounter: 01/11/2019  Primary Cardiologist: Sherren Mocha, MD   Subjective   Breathing better today - I suspect he had decompensated CHF from volume overload. He is diuresing, however, his output and weight have not been tracked. EKG is pending.  Inpatient Medications    Scheduled Meds: . acetaminophen  1,000 mg Oral Q8H  . arformoterol  15 mcg Nebulization BID  . dofetilide  250 mcg Oral BID  . furosemide  40 mg Intravenous BID  . insulin aspart  0-5 Units Subcutaneous QHS  . insulin aspart  0-9 Units Subcutaneous TID WC  . metoprolol succinate  50 mg Oral Daily  . pantoprazole (PROTONIX) IV  40 mg Intravenous Q24H  . sodium chloride flush  10-40 mL Intracatheter Q12H  . umeclidinium bromide  1 puff Inhalation Daily   Continuous Infusions: . heparin 1,250 Units/hr (01/11/19 0328)  . lactated ringers 10 mL/hr at 01/08/19 0925  . piperacillin-tazobactam (ZOSYN)  IV 3.375 g (01/11/19 0438)   PRN Meds: alum & mag hydroxide-simeth, ipratropium-albuterol, metoCLOPramide (REGLAN) injection, morphine injection, morphine injection, oxyCODONE, sodium chloride flush   Vital Signs    Vitals:   01/10/19 1514 01/10/19 2105 01/10/19 2137 01/11/19 0444  BP: (!) 121/93  112/67 121/69  Pulse: 88  77 80  Resp: 18  17 16   Temp: 98.3 F (36.8 C)  98.2 F (36.8 C) 97.8 F (36.6 C)  TempSrc: Oral  Oral   SpO2: 94% 92% 94% 96%  Weight:      Height:        Intake/Output Summary (Last 24 hours) at 01/11/2019 1020 Last data filed at 01/11/2019 [id number] Gross per 24 hour  Intake 763.7 ml  Output 900 ml  Net -136.3 ml   Last 3 Weights 01/06/2019 01/05/2019 01/04/2019  Weight (lbs) 204 lb 12.9 oz 204 lb 12.9 oz 204 lb 12.9 oz  Weight (kg) 92.9 kg 92.9 kg 92.9 kg      Telemetry    Mostly V pacing 80's-90's - Personally Reviewed  ECG    pending  Physical Exam   GEN:  Awake, breathing better Neck: JVP elevated to 5  cm Cardiac: RRR, no murmurs, rubs, or gallops.  Respiratory: diminished breath sounds and bibasilar crackles GI: Soft, mild tenderness MS: No edema; No deformity. Neuro:  Nonfocal  Psych: Normal affect   Labs    High Sensitivity Troponin:   Recent Labs  Lab 01/03/19 1548 01/03/19 1755 01/04/19 0500 01/04/19 0844 01/06/19 1209  TROPONINIHS 10 11 20* 15 24*      Chemistry Recent Labs  Lab 01/09/19 0500 01/10/19 0451 01/11/19 0500  NA 133* 132* 132*  K 3.2* 3.7 3.8  CL 99 98 100  CO2 22 24 23   GLUCOSE 167* 179* 185*  BUN 8 6* 11  CREATININE 0.76 0.82 1.12  CALCIUM 8.0* 8.2* 8.0*  PROT 5.2* 5.5* 5.3*  ALBUMIN 2.4* 2.4* 2.3*  AST 472* 282* 107*  ALT 339* 321* 214*  ALKPHOS 322* 443* 392*  BILITOT 3.2* 4.0* 2.9*  GFRNONAA >60 >60 60*  GFRAA >60 >60 >60  ANIONGAP 12 10 9      Hematology Recent Labs  Lab 01/09/19 0500 01/10/19 0451 01/11/19 0500  WBC 6.3 5.5 5.5  RBC 3.12* 3.08* 3.03*  HGB 10.7* 10.7* 10.4*  HCT 31.9* 31.6* 30.7*  MCV 102.2* 102.6* 101.3*  MCH 34.3* 34.7* 34.3*  MCHC 33.5 33.9 33.9  RDW 13.7 14.1 13.4  PLT 127* 139*  143*    BNP No results for input(s): BNP, PROBNP in the last 168 hours.   DDimer No results for input(s): DDIMER in the last 168 hours.   Radiology    DG ERCP sphincterotomy  Result Date: 01/10/2019 CLINICAL DATA:  85 year old male with a history choledocholithiasis EXAM: ERCP TECHNIQUE: Multiple spot images obtained with the fluoroscopic device and submitted for interpretation post-procedure. FLUOROSCOPY TIME:  Fluoroscopy Time:  3 minutes 2 seconds COMPARISON:  CT 01/06/2019, intraop cholangio 01/08/2019 FINDINGS: Limited intraoperative fluoroscopic spot images during ERCP. Initial image demonstrates endoscope projecting over the upper abdomen with a safety wire in place. Partial opacification of the extrahepatic biliary ducts. There is deployment of a balloon retrieval catheter. No extraluminal contrast with surgical  changes of cholecystectomy. IMPRESSION: Limited images of ERCP demonstrates treatment of choledocholithiasis with deployment of a balloon catheter. Please refer to the dictated operative report for full details of intraoperative findings and procedure. Electronically Signed   By: Corrie Mckusick D.O.   On: 01/10/2019 09:06    Cardiac Studies    Echo 11/05/18 1. Left ventricular ejection fraction, by visual estimation, is 35 to 40%. The left ventricle has moderately decreased function. There is global left ventricular hypokinesis and marked apical-basal and septal-lateral dyssynchrony. There appears to be  apical akinesis. 2. Abnormal septal motion consistent with RV pacemaker. 3. Global right ventricle has moderately reduced systolic function.The right ventricular size is normal. No increase in right ventricular wall thickness. 4. Left atrial size was severely dilated. 5. Right atrial size was moderately dilated. 6. Mild mitral valve regurgitation. Mild-moderate mitral stenosis. Peak and mean gradients are 16 and 5 mm Hg, respectively, at 70 bpm. Estimated valve area is 1.4 cm sq by the PHT method. 7. Moderate calcification of the mitral valve leaflet(s). 8. Severe mitral annular calcification. 9. Severe thickening of the mitral valve leaflet(s). 10. The tricuspid valve is normal in structure. Tricuspid valve regurgitation is mild-moderate. 11. The aortic valve is normal in structure. Aortic valve regurgitation was not visualized by color flow Doppler. 12. The pulmonic valve was normal in structure. Pulmonic valve regurgitation is mild by color flow Doppler. 13. Mildly elevated pulmonary artery systolic pressure. 14. A pacer wire is visualized. 15. The inferior vena cava is dilated in size with >50% respiratory variability, suggesting right atrial pressure of 8 mmHg. 16. Left ventricular diastolic function could not be evaluated due to atrial fibrillation and mitral stenosis. 73.  Compared to February 2020, mitral valve gradients are worse, probably due to new atrial fibrillation and increased ventricular rate.  Cardiac cath 11/05/2016 1. Severe 2 vessel CAD with continued patency of the LIMA-LAD and right radial graft-OM 2. Moderate, severely calcified, proximal circumflex stenosis with negative FFR assessment 3. Normal right heart hemodynamics except for a prominent V wave in the PCWP tracing 4. Normal/low LVEDP  Recommend: ongoing medical therapy. Pt appears to be well-compensated from a hemodynamic perspective.  Patient Profile     85 y.o. male with a hx of CAD s/p CABG, mitral valve repair, polymorphic VT s/p ICD [employer] dual chamber, PFO closure, Persistent afib on Eliquis, Chronic systolic and diastolic HF (EF [id number]), HTN, DM, SIADH, carotid artery disease s/p R CEA 10/2018 and HLDwho is being seen today for the evaluation ofpre-operative cardiac evaluation for acute pancreatitis.  Assessment & Plan    Pt seen initially for preop evaluation -Pt was noted to be at increased risk for surgery but no cardiac workup indicated.  -Underwent lap chole on 12/9,  ERCP done this morning. Currently stable.  -pt had atypcial chest pain with mildly elevated troponins in flat pattern, not consistent with ACS. Now chest pain free.  -On heparin for anticoagulation, switch back to Eliquis when OK with surgery.   Paroxysmal atrial fibrillation -Pt continues on [name], has rash with amiodarone recently. Continues on Toprol XL.  -[name] dose was reduced to 250 mcg due to prolonged QTC up to 590. QTC improved on lower dose to 482. Today is 540. K+ has been as low as 3, K= is 3.7 today. Will give KDur 40 mEq today.  -Keep K+ above 4 and Mg above 2. Mag was low 1.7 on 12/6 and mag was given. Mag was last checked, 1.8 on 12/9. I will check Mag today.  -EKG is reading atrial fibrillation, can see some p waves, ?could be sinus.  -Eliquis held for surgery>IV heparin. Resume  Eliquis when OK with surgery.   AICD in place -Hx of polymorphic VT.  -As above, keep K+>4 and Mg >2.   Infrarenal abdominal aortic aneurysm 5.7 mm with no dissection -Vascular plan for surgery after this current issue resolved.   Acute on chronic systolic heart failure -LVEF 35-40% in 10/2018. Has ICD in place -On BB at home..  -Volume overloaded due to IVF's - +9L -Continue lasix diuresis today. Monitor creatinine, urine output and daily weights.     For questions or updates, please contact [place] Please consult [email or web address] for contact info under     Pixie Casino, MD, FACC, [place] Director of the [facility] Diplomate of the American Board of Clinical Lipidology Attending Cardiologist  Direct Dial: [phone]  Fax: [phone]  Website:  [email or web address]  Pixie Casino, MD  01/11/2019, 10:20 AM

## 2019-01-11 NOTE — Progress Notes (Signed)
PROGRESS NOTE    [name]  NWG:[id number] DOB: Apr 26, 1931 DOA: 01/03/2019 PCP: Joycelyn Rua, MD   Brief Narrative:  HPI On 01/04/2019 by Dr. Midge Minium [name] is a 85 y.o. male with history of CAD status post CABG, chronic systolic heart failure status post AICD placement, A. fib on apixaban and [name] had a reaction to amiodarone recently, recent carotid endarterectomy admitted recently for CHF in October 2 months ago presents to the ER with complaint of severe abdominal pain over the last 2 days with no nausea vomiting or diarrhea.  Abdominal pain is mostly in the lower part of the abdomen.  Likely.  Denies any fever chills or chest pain or shortness of breath.  Abdominal pain is dull severe pain not associated with food.  Interim history Patient admitted for acute biliary pancreatitis, status post cholecystectomy.  Patient did have ERCP. Now with volume overload.  Assessment & Plan   Acute biliary pancreatitis/choledocholithiasis with elevated LFTs -Patient will call us lithiasis with dilated CBD -CT scan on admission showed features concerning for acute pancreatitis with dilated CBD and gallstones with ductal enhancement concerning for infection -Initially managed by supportive care, bowel rest and IV fluids -[facility] and general surgery consulted -Patient was placed on Zosyn -Repeat CT abdomen on 01/06/2019 showed acute pancreatitis without necrosis, distended gallbladder with stones and CBD dilatation up to 30 mm with question of appendicitis as well -General surgery performed lap cholecystectomy with IOC.  Patient noted to have questionable filling defect in the distal CBD -MRCP could not be done as patient has pacemaker -Status post ERCP today, showing choledocholithiasis.  Complete removal was accomplished by biliary sphincterotomy and balloon extraction.  GI recommended clear liquid diet for 6 hours and may advance as tolerated thereafter.   Recommended restarting heparin on 12/12.  -LFTs trending downward -Have advance patient to full liquid diet, if tolerates will advance to soft diet today  Infrarenal abdominal aortic aneurysm -Measuring 5.7 cm no dissection -Patient will need elective vascular surgical intervention once gallstone pancreatitis issues have resolved -Vascular surgery was consulted and appreciated  Paroxysmal atrial fibrillation -Currently rate controlled -Eliquis held-we will likely restart in 24 to 48 hours -On IV heparin -Continue [name] and metoprolol -Cardiology consulted and appreciated  Hyponatremia -Patient with recent diagnosis of SIADH -Sodium currently 132 -Continue monitor BMP  Hypokalemia -Resolved with replacement, currently 3.8 -Continue supplementation given IV Lasix -Continue to monitor BMP  Acute on chronic systolic heart failure -With history of AICD -Echocardiogram October 2020 showed an EF of 35 to 40% -Patient with complaints of shortness of breath.  On examination does have upper and lower extremity edema.  Patient did receive volume resuscitation during surgery as well as ERCP -IV fluids have been discontinued -Place patient on IV Lasix 40 mg twice daily -Monitor intake and output, daily weights -Cardiology consulted and appreciated  Diabetes mellitus, type II -Continue insulin sliding scale and CBG monitoring  Microcytic anemia -Vitamin B12, folate, TSH are all unremarkable and within normal limits -hemoglobin stable, currently 10.4 -Continue to monitor CBC  Generalized deconditioning -PT recommending home health  DVT Prophylaxis  heparin  Code Status: Full  Family Communication: None at bedside  Disposition Plan: Admitted.  Not treating patient for acute on chronic CHF.  Suspect home within the next 24 to 48 hours.  Consultants Gastroenterology General surgery  Procedures  lap cholecystectomy with IOC.  Patient noted to have questionable filling defect  in the distal CBD  ERCP  Antibiotics  Anti-infectives (From admission, onward)   Start     Dose/Rate Route Frequency Ordered Stop   01/04/19 0430  piperacillin-tazobactam (ZOSYN) IVPB 3.375 g     3.375 g 12.5 mL/hr over 240 Minutes Intravenous Every 8 hours 01/04/19 0415        Subjective:   [name] seen and examined today.  Patient feeling mildly better today.  Feels abdominal pain has improved.  Does have some shortness of breath, and states he gets very winded with just turning in the bed.  Denies current chest pain, abdominal pain, nausea or vomiting, dizziness or headache. Objective:   Vitals:   01/10/19 1514 01/10/19 2105 01/10/19 2137 01/11/19 0444  BP: (!) 121/93  112/67 121/69  Pulse: 88  77 80  Resp: 18  17 16   Temp: 98.3 F (36.8 C)  98.2 F (36.8 C) 97.8 F (36.6 C)  TempSrc: Oral  Oral   SpO2: 94% 92% 94% 96%  Weight:      Height:        Intake/Output Summary (Last 24 hours) at 01/11/2019 1236 Last data filed at 01/11/2019 1914 Gross per 24 hour  Intake 763.7 ml  Output 900 ml  Net -136.3 ml   Filed Weights   01/04/19 0242 01/05/19 0500 01/06/19 0500  Weight: 92.9 kg 92.9 kg 92.9 kg   Exam  General: Well developed, well nourished, NAD, appears stated age  HEENT: NCAT, mucous membranes moist.   Cardiovascular: S1 S2 auscultated, irregular,+SEM  Respiratory: Diminished breath sounds  Abdomen: Soft, mildly TTP, nondistended, + bowel sounds, mild serosanguineous fluid noted from surgical site  Extremities: warm dry without cyanosis clubbing. Edema upper/lower ext b/l  Neuro: AAOx3, nonfocal  Psych: Appropriate mood and affect, pleasant   Data Reviewed: I have personally reviewed following labs and imaging studies  CBC: Recent Labs  Lab 01/07/19 0319 01/08/19 0404 01/09/19 0500 01/10/19 0451 01/11/19 0500  WBC 8.3 7.8 6.3 5.5 5.5  NEUTROABS  --  6.3  --   --  4.3  HGB 10.6* 10.6* 10.7* 10.7* 10.4*  HCT 30.5* 30.8* 31.9* 31.6*  30.7*  MCV 101.0* 103.0* 102.2* 102.6* 101.3*  PLT 125* 131* 127* 139* 143*   Basic Metabolic Panel: Recent Labs  Lab 01/05/19 0343 01/06/19 0318 01/07/19 0319 01/08/19 0404 01/09/19 0500 01/10/19 0451 01/10/19 1115 01/11/19 0500  NA 129* 132* 131* 133* 133* 132*  --  132*  K 4.2 3.7 3.3* 3.0* 3.2* 3.7  --  3.8  CL 97* 97* 99 101 99 98  --  100  CO2 24 19* 22 22 22 24   --  23  GLUCOSE 189* 169* 147* 157* 167* 179*  --  185*  BUN 18 16 14 11 8  6*  --  11  CREATININE 0.98 1.07 0.79 0.75 0.76 0.82  --  1.12  CALCIUM 8.0* 8.1* 7.9* 7.9* 8.0* 8.2*  --  8.0*  MG 1.7 2.1 1.9 1.8  --   --  1.8  --    GFR: Estimated Creatinine Clearance: 54.5 mL/min (by C-G formula based on SCr of 1.12 mg/dL). Liver Function Tests: Recent Labs  Lab 01/07/19 0319 01/08/19 0404 01/09/19 0500 01/10/19 0451 01/11/19 0500  AST 37 33 472* 282* 107*  ALT 54* 48* 339* 321* 214*  ALKPHOS 109 99 322* 443* 392*  BILITOT 1.9* 1.3* 3.2* 4.0* 2.9*  PROT 5.4* 5.2* 5.2* 5.5* 5.3*  ALBUMIN 2.4* 2.4* 2.4* 2.4* 2.3*   Recent Labs  Lab 01/07/19 0319  LIPASE 31  No results for input(s): AMMONIA in the last 168 hours. Coagulation Profile: No results for input(s): INR, PROTIME in the last 168 hours. Cardiac Enzymes: No results for input(s): CKTOTAL, CKMB, CKMBINDEX, TROPONINI in the last 168 hours. BNP (last 3 results) No results for input(s): PROBNP in the last 8760 hours. HbA1C: No results for input(s): HGBA1C in the last 72 hours. CBG: Recent Labs  Lab 01/10/19 1204 01/10/19 1659 01/10/19 2043 01/11/19 0746 01/11/19 1101  GLUCAP 184* 169* 172* 175* 248*   Lipid Profile: No results for input(s): CHOL, HDL, LDLCALC, TRIG, CHOLHDL, LDLDIRECT in the last 72 hours. Thyroid Function Tests: No results for input(s): TSH, T4TOTAL, FREET4, T3FREE, THYROIDAB in the last 72 hours. Anemia Panel: No results for input(s): VITAMINB12, FOLATE, FERRITIN, TIBC, IRON, RETICCTPCT in the last 72 hours. Urine  analysis:    Component Value Date/Time   COLORURINE YELLOW 01/03/2019 1417   APPEARANCEUR TURBID (A) 01/03/2019 1417   LABSPEC >1.030 (H) 01/03/2019 1417   PHURINE 6.0 01/03/2019 1417   GLUCOSEU 250 (A) 01/03/2019 1417   HGBUR NEGATIVE 01/03/2019 1417   BILIRUBINUR SMALL (A) 01/03/2019 1417   KETONESUR 15 (A) 01/03/2019 1417   PROTEINUR 100 (A) 01/03/2019 1417   NITRITE NEGATIVE 01/03/2019 1417   LEUKOCYTESUR NEGATIVE 01/03/2019 1417   Sepsis Labs: @LABRCNTIP (procalcitonin:4,lacticidven:4)  ) Recent Results (from the past 240 hour(s))  Urine culture     Status: Abnormal   Collection Time: 01/03/19  2:17 PM   Specimen: Urine, Random  Result Value Ref Range Status   Specimen Description   Final    URINE, RANDOM Performed at [facility], [address] [place], [place] [address]    Special Requests   Final    NONE Performed at [facility], [address] [place], [place] [address]    Culture 20,000 COLONIES/mL ENTEROCOCCUS FAECALIS (A)  Final   Report Status 01/06/2019 FINAL  Final   Organism ID, Bacteria ENTEROCOCCUS FAECALIS (A)  Final      Susceptibility   Enterococcus faecalis - MIC*    AMPICILLIN <=2 SENSITIVE Sensitive     LEVOFLOXACIN 1 SENSITIVE Sensitive     NITROFURANTOIN 32 SENSITIVE Sensitive     VANCOMYCIN 1 SENSITIVE Sensitive     * 20,000 COLONIES/mL ENTEROCOCCUS FAECALIS  SARS CORONAVIRUS 2 (TAT 6-24 HRS) Nasopharyngeal Nasopharyngeal Swab     Status: None   Collection Time: 01/03/19  8:36 PM   Specimen: Nasopharyngeal Swab  Result Value Ref Range Status   SARS Coronavirus 2 NEGATIVE NEGATIVE Final    Comment: (NOTE) SARS-CoV-2 target nucleic acids are NOT DETECTED. The SARS-CoV-2 RNA is generally detectable in upper and lower respiratory specimens during the acute phase of infection. Negative results do not preclude SARS-CoV-2 infection, do not rule out co-infections with other pathogens, and should not be used as the sole  basis for treatment or other patient management decisions. Negative results must be combined with clinical observations, patient history, and epidemiological information. The expected result is Negative. Fact Sheet for Patients: HairSlick.no Fact Sheet for Healthcare Providers: quierodirigir.com This test is not yet approved or cleared by the [place] FDA and  has been authorized for detection and/or diagnosis of SARS-CoV-2 by FDA under an Emergency Use Authorization (EUA). This EUA will remain  in effect (meaning this test can be used) for the duration of the COVID-19 declaration under Section 56 4(b)(1) of the Act, 21 U.S.C. section 360bbb-3(b)(1), unless the authorization is terminated or revoked sooner. Performed  at [facility], [address], [place], [place] [address]   C difficile quick scan w PCR reflex     Status: None   Collection Time: 01/07/19  5:56 PM   Specimen: STOOL  Result Value Ref Range Status   C Diff antigen NEGATIVE NEGATIVE Final   C Diff toxin NEGATIVE NEGATIVE Final   C Diff interpretation No C. difficile detected.  Final    Comment: Performed at [facility], [address], [place], [place] [address]  GI pathogen panel by PCR, stool     Status: None   Collection Time: 01/07/19  5:56 PM  Result Value Ref Range Status   Plesiomonas shigelloides NOT DETECTED NOT DETECTED Final   Yersinia enterocolitica NOT DETECTED NOT DETECTED Final   Vibrio NOT DETECTED NOT DETECTED Final   Enteropathogenic E coli NOT DETECTED NOT DETECTED Final   E coli (ETEC) LT/ST NOT DETECTED NOT DETECTED Final   E coli 0157 by PCR Not applicable NOT DETECTED Final   Cryptosporidium by PCR NOT DETECTED NOT DETECTED Final   Entamoeba histolytica NOT DETECTED NOT DETECTED Final   Adenovirus F 40/41 NOT DETECTED NOT DETECTED Final   Norovirus GI/GII NOT DETECTED NOT DETECTED Final   Sapovirus NOT DETECTED NOT  DETECTED Final    Comment: (NOTE) Performed At: [facility] [address] [place], [place] [id number] Jolene Schimke MD GE:[id number]    Vibrio cholerae NOT DETECTED NOT DETECTED Final   Campylobacter by PCR NOT DETECTED NOT DETECTED Final   Salmonella by PCR NOT DETECTED NOT DETECTED Final   E coli (STEC) NOT DETECTED NOT DETECTED Final   Enteroaggregative E coli NOT DETECTED NOT DETECTED Final   Shigella by PCR NOT DETECTED NOT DETECTED Final   Cyclospora cayetanensis NOT DETECTED NOT DETECTED Final   Astrovirus NOT DETECTED NOT DETECTED Final   G lamblia by PCR NOT DETECTED NOT DETECTED Final   Rotavirus A by PCR NOT DETECTED NOT DETECTED Final  Surgical PCR screen     Status: None   Collection Time: 01/08/19  6:51 AM   Specimen: Nasal Mucosa; Nasal Swab  Result Value Ref Range Status   MRSA, PCR NEGATIVE NEGATIVE Final   Staphylococcus aureus NEGATIVE NEGATIVE Final    Comment: (NOTE) The Xpert SA Assay (FDA approved for NASAL specimens in patients 5 years of age and older), is one component of a comprehensive surveillance program. It is not intended to diagnose infection nor to guide or monitor treatment. Performed at [facility], [address] [place], [place] [address]       Radiology Studies: DG ERCP sphincterotomy  Result Date: 01/10/2019 CLINICAL DATA:  85 year old male with a history choledocholithiasis EXAM: ERCP TECHNIQUE: Multiple spot images obtained with the fluoroscopic device and submitted for interpretation post-procedure. FLUOROSCOPY TIME:  Fluoroscopy Time:  3 minutes 2 seconds COMPARISON:  CT 01/06/2019, intraop cholangio 01/08/2019 FINDINGS: Limited intraoperative fluoroscopic spot images during ERCP. Initial image demonstrates endoscope projecting over the upper abdomen with a safety wire in place. Partial opacification of the extrahepatic biliary ducts. There is deployment of a balloon retrieval catheter. No extraluminal contrast with  surgical changes of cholecystectomy. IMPRESSION: Limited images of ERCP demonstrates treatment of choledocholithiasis with deployment of a balloon catheter. Please refer to the dictated operative report for full details of intraoperative findings and procedure. Electronically Signed   By: Gilmer Mor D.O.   On: 01/10/2019 09:06     Scheduled Meds: . acetaminophen  1,000  mg Oral Q8H  . arformoterol  15 mcg Nebulization BID  . dofetilide  250 mcg Oral BID  . furosemide  40 mg Intravenous BID  . insulin aspart  0-5 Units Subcutaneous QHS  . insulin aspart  0-9 Units Subcutaneous TID WC  . metoprolol succinate  50 mg Oral Daily  . pantoprazole (PROTONIX) IV  40 mg Intravenous Q24H  . sodium chloride flush  10-40 mL Intracatheter Q12H  . umeclidinium bromide  1 puff Inhalation Daily   Continuous Infusions: . heparin 1,250 Units/hr (01/11/19 0328)  . lactated ringers 10 mL/hr at 01/08/19 0925  . piperacillin-tazobactam (ZOSYN)  IV 3.375 g (01/11/19 0438)     LOS: 8 days   Time Spent in minutes  45 minutes  Tamicka Shimon D.O. on 01/11/2019 at 12:36 PM  Between 7am to 7pm - Please see pager noted on [email or web address]  After 7pm go to [email or web address]  And look for the night coverage person covering for me after hours  [facility] Office  [phone]

## 2019-01-11 NOTE — Progress Notes (Signed)
[facility] Gastroenterology Progress Note  [name] 85 y.o. [birth year]   Subjective: Feels good. Reports leaking from one of the port sites this morning causing dressing to be soaked. Denies abdominal pain.  Objective: Vital signs: Vitals:   01/10/19 2137 01/11/19 0444  BP: 112/67 121/69  Pulse: 77 80  Resp: 17 16  Temp: 98.2 F (36.8 C) 97.8 F (36.6 C)  SpO2: 94% 96%    Physical Exam: Gen: alert, no acute distress, elderly, well-nourished, pleasant HEENT: anicteric sclera CV: RRR Chest: CTA B Abd: soft, nontender, nondistended, +BS Ext: no edema  Lab Results: Recent Labs    01/10/19 0451 01/10/19 1115 01/11/19 0500  NA 132*  --  132*  K 3.7  --  3.8  CL 98  --  100  CO2 24  --  23  GLUCOSE 179*  --  185*  BUN 6*  --  11  CREATININE 0.82  --  1.12  CALCIUM 8.2*  --  8.0*  MG  --  1.8  --    Recent Labs    01/10/19 0451 01/11/19 0500  AST 282* 107*  ALT 321* 214*  ALKPHOS 443* 392*  BILITOT 4.0* 2.9*  PROT 5.5* 5.3*  ALBUMIN 2.4* 2.3*   Recent Labs    01/10/19 0451 01/11/19 0500  WBC 5.5 5.5  NEUTROABS  --  4.3  HGB 10.7* 10.4*  HCT 31.6* 30.7*  MCV 102.6* 101.3*  PLT 139* 143*      Assessment/Plan: S/P choledocholithiasis removal and sphincterotomy yesterday on ERCP. Doing well post-procedure. LFTs improving. On clear liquid diet and advancement per surgery. Defer leaking port site to surgery. Will follow.   [name] 01/11/2019, 10:51 AM  Questions please call [phone] ID: [name], male   DOB: [birth year], 85 y.o.   MRN: [id number]

## 2019-01-12 LAB — GLUCOSE, CAPILLARY
Glucose-Capillary: 257 mg/dL — ABNORMAL HIGH (ref 70–99)
Glucose-Capillary: 161 mg/dL — ABNORMAL HIGH (ref 70–99)
Glucose-Capillary: 184 mg/dL — ABNORMAL HIGH (ref 70–99)
Glucose-Capillary: 216 mg/dL — ABNORMAL HIGH (ref 70–99)

## 2019-01-12 LAB — COMPREHENSIVE METABOLIC PANEL
ALT: 158 U/L — ABNORMAL HIGH (ref 0–44)
AST: 55 U/L — ABNORMAL HIGH (ref 15–41)
Albumin: 2.6 g/dL — ABNORMAL LOW (ref 3.5–5.0)
Alkaline Phosphatase: 389 U/L — ABNORMAL HIGH (ref 38–126)
Anion gap: 10 (ref 5–15)
BUN: 10 mg/dL (ref 8–23)
CO2: 26 mmol/L (ref 22–32)
Calcium: 8.1 mg/dL — ABNORMAL LOW (ref 8.9–10.3)
Chloride: 95 mmol/L — ABNORMAL LOW (ref 98–111)
Creatinine, Ser: 1.21 mg/dL (ref 0.61–1.24)
GFR calc Af Amer: >60 mL/min (ref >60)
GFR calc non Af Amer: 54 mL/min — ABNORMAL LOW (ref >60)
Glucose, Bld: 189 mg/dL — ABNORMAL HIGH (ref 70–99)
Potassium: 3.5 mmol/L (ref 3.5–5.1)
Sodium: 131 mmol/L — ABNORMAL LOW (ref 135–145)
Total Bilirubin: 2.4 mg/dL — ABNORMAL HIGH (ref 0.3–1.2)
Total Protein: 5.9 g/dL — ABNORMAL LOW (ref 6.5–8.1)

## 2019-01-12 LAB — CBC
HCT: 32.6 % — ABNORMAL LOW (ref 39.0–52.0)
Hemoglobin: 11.4 g/dL — ABNORMAL LOW (ref 13.0–17.0)
MCH: 34.8 pg — ABNORMAL HIGH (ref 26.0–34.0)
MCHC: 35 g/dL (ref 30.0–36.0)
MCV: 99.4 fL (ref 80.0–100.0)
Platelets: 176 10*3/uL (ref 150–400)
RBC: 3.28 MIL/uL — ABNORMAL LOW (ref 4.22–5.81)
RDW: 13.8 % (ref 11.5–15.5)
WBC: 7.2 10*3/uL (ref 4.0–10.5)
nRBC: 0 % (ref 0.0–0.2)

## 2019-01-12 LAB — HEPARIN LEVEL (UNFRACTIONATED): Heparin Unfractionated: 0.32 IU/mL (ref 0.30–0.70)

## 2019-01-12 LAB — MAGNESIUM: Magnesium: 1.8 mg/dL (ref 1.7–2.4)

## 2019-01-12 MED ORDER — DIPHENHYDRAMINE HCL 25 MG PO CAPS
25.0000 mg | ORAL_CAPSULE | Freq: Once | ORAL | Status: AC
  Administered 2019-01-12: 25 mg via ORAL
  Filled 2019-01-12: qty 1

## 2019-01-12 MED ORDER — POTASSIUM CHLORIDE CRYS ER 20 MEQ PO TBCR
40.0000 meq | EXTENDED_RELEASE_TABLET | Freq: Two times a day (BID) | ORAL | Status: AC
  Administered 2019-01-12 (×2): 40 meq via ORAL
  Filled 2019-01-12 (×2): qty 2

## 2019-01-12 MED ORDER — APIXABAN 5 MG PO TABS
5.0000 mg | ORAL_TABLET | Freq: Two times a day (BID) | ORAL | Status: DC
  Administered 2019-01-12 – 2019-01-14 (×5): 5 mg via ORAL
  Filled 2019-01-12 (×5): qty 1

## 2019-01-12 NOTE — Progress Notes (Signed)
Patient ID: [name], male   DOB: 1931-11-05, 85 y.o.   MRN: [id number] [facility] Progress Note:   2 Days Post-Op  Subjective: Mental status is clear.  Had drainage from right side and heparin stopped Objective: Vital signs in last 24 hours: Temp:  [97.6 F (36.4 C)-98.5 F (36.9 C)] 98.4 F (36.9 C) (12/13 0544) Pulse Rate:  [66-94] 84 (12/13 0544) Resp:  [16-18] 18 (12/13 0544) BP: (118-125)/(86-92) 125/86 (12/13 0544) SpO2:  [93 %-99 %] 96 % (12/13 0544) Weight:  [100 kg] 100 kg (12/13 0544)  Intake/Output from previous day: 12/12 0701 - 12/13 0700 In: 446.9 [I.V.:264.5; IV Piggyback:182.4] Out: 2700 [Urine:2700] Intake/Output this shift: Total I/O In: 656 [P.O.:656] Out: 450 [Urine:450]  Physical Exam: Work of breathing is normal.  Drainage from wound has stopped.    Lab Results:  Results for orders placed or performed during the hospital encounter of 01/03/19 (from the past 48 hour(s))  Magnesium     Status: None   Collection Time: 01/10/19 11:15 AM  Result Value Ref Range   Magnesium 1.8 1.7 - 2.4 mg/dL    Comment: Performed at [facility], [place] [address] [place], [place] [address]  Glucose, capillary     Status: Abnormal   Collection Time: 01/10/19 12:04 PM  Result Value Ref Range   Glucose-Capillary 184 (H) 70 - 99 mg/dL  Glucose, capillary     Status: Abnormal   Collection Time: 01/10/19  4:59 PM  Result Value Ref Range   Glucose-Capillary 169 (H) 70 - 99 mg/dL  Glucose, capillary     Status: Abnormal   Collection Time: 01/10/19  8:43 PM  Result Value Ref Range   Glucose-Capillary 172 (H) 70 - 99 mg/dL  Heparin level (unfractionated)     Status: Abnormal   Collection Time: 01/11/19  1:36 AM  Result Value Ref Range   Heparin Unfractionated 0.27 (L) 0.30 - 0.70 IU/mL    Comment: (NOTE) If heparin results are below expected values, and patient dosage has  been confirmed, suggest follow up testing of antithrombin III  levels. Performed at [facility], [place] [address] [place], [place] [address]   APTT     Status: Abnormal   Collection Time: 01/11/19  1:36 AM  Result Value Ref Range   aPTT 112 (H) 24 - 36 seconds    Comment:        IF BASELINE aPTT IS ELEVATED, SUGGEST PATIENT RISK ASSESSMENT BE USED TO DETERMINE APPROPRIATE ANTICOAGULANT THERAPY. Performed at [facility], [place] [address] [place], [place] [address]   Comprehensive metabolic panel once     Status: Abnormal   Collection Time: 01/11/19  5:00 AM  Result Value Ref Range   Sodium 132 (L) 135 - 145 mmol/L   Potassium 3.8 3.5 - 5.1 mmol/L   Chloride 100 98 - 111 mmol/L   CO2 23 22 - 32 mmol/L   Glucose, Bld 185 (H) 70 - 99 mg/dL   BUN 11 8 - 23 mg/dL   Creatinine, Ser 1.12 0.61 - 1.24 mg/dL   Calcium 8.0 (L) 8.9 - 10.3 mg/dL   Total Protein 5.3 (L) 6.5 - 8.1 g/dL   Albumin 2.3 (L) 3.5 - 5.0 g/dL   AST 107 (H) 15 - 41 U/L   ALT 214 (H) 0 - 44 U/L   Alkaline Phosphatase 392 (H) 38 - 126 U/L   Total Bilirubin 2.9 (H) 0.3 - 1.2 mg/dL   GFR  calc non Af Amer 60 (L) >60 mL/min   GFR calc Af Amer >60 >60 mL/min   Anion gap 9 5 - 15    Comment: Performed at [place] [address], [place], [place] [address]  CBC with Differential     Status: Abnormal   Collection Time: 01/11/19  5:00 AM  Result Value Ref Range   WBC 5.5 4.0 - 10.5 K/uL   RBC 3.03 (L) 4.22 - 5.81 MIL/uL   Hemoglobin 10.4 (L) 13.0 - 17.0 g/dL   HCT 30.7 (L) 39.0 - 52.0 %   MCV 101.3 (H) 80.0 - 100.0 fL   MCH 34.3 (H) 26.0 - 34.0 pg   MCHC 33.9 30.0 - 36.0 g/dL   RDW 13.4 11.5 - 15.5 %   Platelets 143 (L) 150 - 400 K/uL   nRBC 0.0 0.0 - 0.2 %   Neutrophils Relative % 77 %   Neutro Abs 4.3 1.7 - 7.7 K/uL   Lymphocytes Relative 7 %   Lymphs Abs 0.4 (L) 0.7 - 4.0 K/uL   Monocytes Relative 8 %   Monocytes Absolute 0.4 0.1 - 1.0 K/uL   Eosinophils Relative 5 %   Eosinophils Absolute 0.3 0.0 - 0.5 K/uL   Basophils Relative 1 %   Basophils  Absolute 0.0 0.0 - 0.1 K/uL   Immature Granulocytes 2 %   Abs Immature Granulocytes 0.11 (H) 0.00 - 0.07 K/uL    Comment: Performed at [facility], [address], [place], [place] [address]  Glucose, capillary     Status: Abnormal   Collection Time: 01/11/19  7:46 AM  Result Value Ref Range   Glucose-Capillary 175 (H) 70 - 99 mg/dL  Glucose, capillary     Status: Abnormal   Collection Time: 01/11/19 11:01 AM  Result Value Ref Range   Glucose-Capillary 248 (H) 70 - 99 mg/dL  Heparin level     Status: Abnormal   Collection Time: 01/11/19 12:02 PM  Result Value Ref Range   Heparin Unfractionated 0.20 (L) 0.30 - 0.70 IU/mL    Comment: (NOTE) If heparin results are below expected values, and patient dosage has  been confirmed, suggest follow up testing of antithrombin III levels. Performed at [facility], [place] [address], [place], [place] [address]   Glucose, capillary     Status: Abnormal   Collection Time: 01/11/19  4:15 PM  Result Value Ref Range   Glucose-Capillary 212 (H) 70 - 99 mg/dL  CBC     Status: Abnormal   Collection Time: 01/11/19  4:48 PM  Result Value Ref Range   WBC 5.6 4.0 - 10.5 K/uL   RBC 3.12 (L) 4.22 - 5.81 MIL/uL   Hemoglobin 10.8 (L) 13.0 - 17.0 g/dL   HCT 31.2 (L) 39.0 - 52.0 %   MCV 100.0 80.0 - 100.0 fL   MCH 34.6 (H) 26.0 - 34.0 pg   MCHC 34.6 30.0 - 36.0 g/dL   RDW 13.8 11.5 - 15.5 %   Platelets 151 150 - 400 K/uL   nRBC 0.0 0.0 - 0.2 %    Comment: Performed at [facility], [place] [address] [place], [place] [address]  Glucose, capillary     Status: Abnormal   Collection Time: 01/11/19  9:25 PM  Result Value Ref Range   Glucose-Capillary 235 (H) 70 - 99 mg/dL  Heparin level (unfractionated)     Status: Abnormal   Collection Time: 01/11/19  9:30 PM  Result  Value Ref Range   Heparin Unfractionated <0.10 (L) 0.30 - 0.70 IU/mL    Comment: (NOTE) If heparin results are below expected values, and patient dosage has  been confirmed,  suggest follow up testing of antithrombin III levels. Performed at [facility], [place] [address] [place], [place] [address]   CBC9     Status: Abnormal   Collection Time: 01/12/19  6:31 AM  Result Value Ref Range   WBC 7.2 4.0 - 10.5 K/uL   RBC 3.28 (L) 4.22 - 5.81 MIL/uL   Hemoglobin 11.4 (L) 13.0 - 17.0 g/dL   HCT 32.6 (L) 39.0 - 52.0 %   MCV 99.4 80.0 - 100.0 fL   MCH 34.8 (H) 26.0 - 34.0 pg   MCHC 35.0 30.0 - 36.0 g/dL   RDW 13.8 11.5 - 15.5 %   Platelets 176 150 - 400 K/uL   nRBC 0.0 0.0 - 0.2 %    Comment: Performed at [facility], [place] [address] [place], [place] [address]  Comprehensive metabolic panel     Status: Abnormal   Collection Time: 01/12/19  6:31 AM  Result Value Ref Range   Sodium 131 (L) 135 - 145 mmol/L   Potassium 3.5 3.5 - 5.1 mmol/L   Chloride 95 (L) 98 - 111 mmol/L   CO2 26 22 - 32 mmol/L   Glucose, Bld 189 (H) 70 - 99 mg/dL   BUN 10 8 - 23 mg/dL   Creatinine, Ser 1.21 0.61 - 1.24 mg/dL   Calcium 8.1 (L) 8.9 - 10.3 mg/dL   Total Protein 5.9 (L) 6.5 - 8.1 g/dL   Albumin 2.6 (L) 3.5 - 5.0 g/dL   AST 55 (H) 15 - 41 U/L   ALT 158 (H) 0 - 44 U/L   Alkaline Phosphatase 389 (H) 38 - 126 U/L   Total Bilirubin 2.4 (H) 0.3 - 1.2 mg/dL   GFR calc non Af Amer 54 (L) >60 mL/min   GFR calc Af Amer >60 >60 mL/min   Anion gap 10 5 - 15    Comment: Performed at [facility], [place] [address] [place], [place] [address]  Heparin level     Status: None   Collection Time: 01/12/19  6:31 AM  Result Value Ref Range   Heparin Unfractionated 0.32 0.30 - 0.70 IU/mL    Comment: (NOTE) If heparin results are below expected values, and patient dosage has  been confirmed, suggest follow up testing of antithrombin III levels. Performed at [facility], [place] [address] [place], [place] [address]   Glucose, capillary     Status: Abnormal   Collection Time: 01/12/19  7:58 AM  Result Value Ref Range   Glucose-Capillary 257 (H) 70 - 99 mg/dL     Radiology/Results: No results found.  Anti-infectives: Anti-infectives (From admission, onward)   Start     Dose/Rate Route Frequency Ordered Stop   01/04/19 0430  piperacillin-tazobactam (ZOSYN) IVPB 3.375 g     3.375 g 12.5 mL/hr over 240 Minutes Intravenous Every 8 hours 01/04/19 0415        Assessment/Plan: Problem List: Patient Active Problem List   Diagnosis Date Noted  . Acute systolic heart failure ([place])   . QT prolongation   . LFT elevation 01/04/2019  . Gallstones 01/04/2019  . Abdominal aortic aneurysm (AAA) without rupture ([place]) 01/04/2019  . Acute pancreatitis 01/03/2019  . CHF (congestive heart failure) ([place]) 11/12/2018  . AICD (automatic cardioverter/defibrillator) present   .  History of seasonal allergies   . Chronic combined systolic (congestive) and diastolic (congestive) heart failure ([place])   . Acute on chronic combined systolic (congestive) and diastolic (congestive) heart failure ([place])   . Congestive heart failure (CHF) ([place]) 11/11/2018  . Amaurosis fugax of right eye 11/05/2018  . Preop cardiovascular exam   . Type 2 diabetes mellitus with complication, without long-term current use of insulin ([place]) 11/04/2018  . TIA (transient ischemic attack) 11/04/2018  . CAD (coronary artery disease) 11/04/2018  . Hyponatremia 11/04/2018  . Closed fracture of right iliac crest ([place])   . Syncope 03/04/2018  . Pelvic fracture ([place]) 03/04/2018  . Asthma-COPD overlap syndrome ([place]) 02/22/2018  . Cough 02/22/2018  . Orthostatic presyncope 08/01/2010  . Coronary artery disease with exertional angina ([place]) 08/01/2010  . Ventricular tachycardia, polymorphic ([place])   . ATRIAL FIBRILLATION 06/15/2008  . PREMATURE VENTRICULAR CONTRACTIONS 04/23/2008  . RAYNAUD'S DISEASE 04/23/2008  . Automatic implantable cardioverter-defibrillator in situ 04/23/2008    Not discharged today.  Restarting Eloquis 2 Days Post-Op    LOS: 9 days   Matt B. Hassell Done, MD, [facility] [phone] beeper [phone]  01/12/2019 9:51 AM

## 2019-01-12 NOTE — Progress Notes (Addendum)
ANTICOAGULATION CONSULT NOTE - Follow Up Consult  Pharmacy Consult for heparin Indication: atrial fibrillation   Labs: Recent Labs    01/10/19 0451 01/10/19 0451 01/11/19 0136 01/11/19 0136 01/11/19 0500 01/11/19 1202 01/11/19 1648 01/11/19 2130 01/12/19 0631  HGB 10.7*  --   --    < > 10.4*  --  10.8*  --  11.4*  HCT 31.6*  --   --   --  30.7*  --  31.2*  --  32.6*  PLT 139*  --   --   --  143*  --  151  --  176  APTT 31  --  112*  --   --   --   --   --   --   HEPARINUNFRC <0.10*   < > 0.27*  --   --  0.20*  --  <0.10* 0.32  CREATININE 0.82  --   --   --  1.12  --   --   --  1.21   < > = values in this interval not displayed.    Assessment: 85yo male admitted for acute pancreatitis. Pt was on apixaban PTA for AFib. Pt is s/p lap chole / appendectomy / ERCP on 12/11. Pharmacy was consulted to resume heparin infusion.   Heparin infusion was paused yesterday for 8 hours due to bleeding from incision site. Repeat CBC was stable. Heparin infusion was restarted last night, and heparin level this AM is therapeutic at 0.32. CBC low but stable, Hgb 11.4, Plt 176. No more bleeding reported per RN.  Goal of Therapy:  Heparin level 0.3-0.7 units/ml   Plan:  Continue heparin infusion at 1400 units/hr Monitor daily heparin level, CBC, and s/sx of bleeding  F/u resuming apixaban when stable  Addendum: Pt has been cleared by surgery to resume apixaban. Will resume apixaban 5 mg PO BID for AFib (Age >75 yo, Wt >60 kg, SCr <1.5). RN has been instructed to turn off heparin infusion with first dose of apixaban.    Berenice Bouton, PharmD PGY1 Pharmacy Resident Phone until 3:30 pm: [phone] 01/12/2019,7:55 AM

## 2019-01-12 NOTE — Progress Notes (Signed)
[facility] Gastroenterology Progress Note  [name] 85 y.o. 14-Jun-1931   Subjective: Feels good. Sitting up in bed eating solid food. Denies N/V/abdominal pain.  Objective: Vital signs: Vitals:   01/11/19 2128 01/12/19 0544  BP: 123/87 125/86  Pulse: 94 84  Resp: 18 18  Temp: 98.5 F (36.9 C) 98.4 F (36.9 C)  SpO2: 96% 96%    Physical Exam: Gen: alert, no acute distress, elderly, pleasant HEENT: anicteric sclera CV: RRR Chest: CTA B Abd: protuberant, abdominal dressing in place, nontender, +BS Ext: no edema  Lab Results: Recent Labs    01/10/19 1115 01/11/19 0500 01/12/19 0631  NA  --  132* 131*  K  --  3.8 3.5  CL  --  100 95*  CO2  --  23 26  GLUCOSE  --  185* 189*  BUN  --  11 10  CREATININE  --  1.12 1.21  CALCIUM  --  8.0* 8.1*  MG 1.8  --   --    Recent Labs    01/11/19 0500 01/12/19 0631  AST 107* 55*  ALT 214* 158*  ALKPHOS 392* 389*  BILITOT 2.9* 2.4*  PROT 5.3* 5.9*  ALBUMIN 2.3* 2.6*   Recent Labs    01/11/19 0500 01/11/19 1648 01/12/19 0631  WBC 5.5 5.6 7.2  NEUTROABS 4.3  --   --   HGB 10.4* 10.8* 11.4*  HCT 30.7* 31.2* 32.6*  MCV 101.3* 100.0 99.4  PLT 143* 151 176      Assessment/Plan: S/P ERCP on 01/10/19 with sphincterotomy and choledocholithiasis removal. LFTs normalizing. Tolerating diet. Wound drainage stopped per surgery. Eliquis being restarted today by surgery. Will sign off. Call if questions.   Lear Ng 01/12/2019, 12:30 PM  Questions please call [phone] ID: [name], male   DOB: [birth year], 85 y.o.   MRN: [id number]

## 2019-01-12 NOTE — Progress Notes (Signed)
Physical Therapy Treatment Patient Details Name: [name] MRN: [id number] DOB: [birth year] Today's Date: 01/12/2019    History of Present Illness 85 yo male admitted for lower abdominal pain and acute pancreatitis. Pt s/p lap cholecystectomy and appendectomy 12/9. Planned ERCP for 12/11. PMH including but not limited to COPD, SOB, orthopnea, CHF, hyponatremia, V-tach, PAF, ICD placement, DM, mitral valve replacement, knee arthroscopy, CABG, cardiac cath, A-fib    PT Comments    Pt supine in bed and motivated to participate.  He reports pain is better controlled and he was able to tolerate 200 ft and negotiate x4 stairs.  Pt continues on track to return home with HHPT.  Plan for next session to progress mobility.      Follow Up Recommendations  Home health PT     Equipment Recommendations  None recommended by PT    Recommendations for Other Services       Precautions / Restrictions Precautions Precautions: Fall Restrictions Weight Bearing Restrictions: No    Mobility  Bed Mobility Overal bed mobility: Modified Independent Bed Mobility: Supine to Sit     Supine to sit: Modified independent (Device/Increase time)     General bed mobility comments: Pt did not require assistance.  Re-educated on use of pillow for splinting but he reports pain is much better now and did not need.  Transfers Overall transfer level: Needs assistance Equipment used: Rolling walker (2 wheeled) Transfers: Sit to/from Stand Sit to Stand: Supervision         General transfer comment: Cues for hand placement to and from seated surface.  Ambulation/Gait Ambulation/Gait assistance: Min guard;Supervision Gait Distance (Feet): 200 Feet Assistive device: Rolling walker (2 wheeled) Gait Pattern/deviations: Step-through pattern;Decreased stride length;Trunk flexed Gait velocity: decreased   General Gait Details: Cues for upper trunk control.  Pt able to increase gt distance as pain is  better controlled,   Stairs   Stairs assistance: Min assist Stair Management: Step to pattern;Forwards;One rail Right Number of Stairs: 4 General stair comments: Cues for sequencing and use of railing. Pt limited to 4 stair due to IV line.   Wheelchair Mobility    Modified Rankin (Stroke Patients Only)       Balance Overall balance assessment: No apparent balance deficits (not formally assessed)                                          Cognition Arousal/Alertness: Awake/alert Behavior During Therapy: WFL for tasks assessed/performed Overall Cognitive Status: Within Functional Limits for tasks assessed                                        Exercises      General Comments        Pertinent Vitals/Pain Pain Assessment: 0-10 Pain Score: 2  Pain Location: right abdomen Pain Descriptors / Indicators: Aching;Guarding Pain Intervention(s): Monitored during session;Repositioned    Home Living                      Prior Function            PT Goals (current goals can now be found in the care plan section) Acute Rehab PT Goals Patient Stated Goal: to go home Potential to Achieve Goals: Good Progress towards PT goals: Progressing toward  goals    Frequency    Min 3X/week      PT Plan Current plan remains appropriate    Co-evaluation              AM-PAC PT "6 Clicks" Mobility   Outcome Measure  Help needed turning from your back to your side while in a flat bed without using bedrails?: A Little Help needed moving from lying on your back to sitting on the side of a flat bed without using bedrails?: A Little Help needed moving to and from a bed to a chair (including a wheelchair)?: A Little Help needed standing up from a chair using your arms (e.g., wheelchair or bedside chair)?: A Little Help needed to walk in hospital room?: A Little Help needed climbing 3-5 steps with a railing? : A Lot 6 Click Score:  17    End of Session Equipment Utilized During Treatment: Gait belt;Oxygen Activity Tolerance: Patient limited by pain;Patient limited by fatigue Patient left: in chair;with call bell/phone within reach Nurse Communication: Mobility status PT Visit Diagnosis: Difficulty in walking, not elsewhere classified (R26.2);Other abnormalities of gait and mobility (R26.89)     Time: 2440-1027 PT Time Calculation (min) (ACUTE ONLY): 13 min  Charges:  $Gait Training: 8-22 mins                     Bonney Leitz , PTA Acute Rehabilitation Services Pager [phone] Office [phone]     Artesha Wemhoff Artis Delay 01/12/2019, 3:37 PM

## 2019-01-12 NOTE — Progress Notes (Signed)
PROGRESS NOTE    [name]  ZOX:[id number] DOB: 03-13-1931 DOA: 01/03/2019 PCP: Joycelyn Rua, MD   Brief Narrative:  HPI On 01/04/2019 by Dr. Midge Minium [name] is a 85 y.o. male with history of CAD status post CABG, chronic systolic heart failure status post AICD placement, A. fib on apixaban and [name] had a reaction to amiodarone recently, recent carotid endarterectomy admitted recently for CHF in October 2 months ago presents to the ER with complaint of severe abdominal pain over the last 2 days with no nausea vomiting or diarrhea.  Abdominal pain is mostly in the lower part of the abdomen.  Likely.  Denies any fever chills or chest pain or shortness of breath.  Abdominal pain is dull severe pain not associated with food.  Interim history Patient admitted for acute biliary pancreatitis, status post cholecystectomy.  Patient did have ERCP. Now with volume overload.  Assessment & Plan   Acute biliary pancreatitis/choledocholithiasis with elevated LFTs -Patient will call us lithiasis with dilated CBD -CT scan on admission showed features concerning for acute pancreatitis with dilated CBD and gallstones with ductal enhancement concerning for infection -Initially managed by supportive care, bowel rest and IV fluids -[facility] and general surgery consulted -Patient was placed on Zosyn -Repeat CT abdomen on 01/06/2019 showed acute pancreatitis without necrosis, distended gallbladder with stones and CBD dilatation up to 30 mm with question of appendicitis as well -General surgery performed lap cholecystectomy with IOC.  Patient noted to have questionable filling defect in the distal CBD -MRCP could not be done as patient has pacemaker -Status post ERCP today, showing choledocholithiasis.  Complete removal was accomplished by biliary sphincterotomy and balloon extraction.  GI recommended clear liquid diet for 6 hours and may advance as tolerated thereafter.   Recommended restarting heparin on 12/12. Will restart Eliquis.  -LFTs trending downward -Advanced to soft diet and tolerating it well  Infrarenal abdominal aortic aneurysm -Measuring 5.7 cm no dissection -Patient will need elective vascular surgical intervention once gallstone pancreatitis issues have resolved -Vascular surgery was consulted and appreciated  Paroxysmal atrial fibrillation -Currently rate controlled -Eliquis held-we will likely restart in 24 to 48 hours -was placed on IV heparin, will restart Eliquis today -Continue [name] and metoprolol -Cardiology consulted and appreciated  Hyponatremia -Patient with recent diagnosis of SIADH -Sodium currently 131 -Continue monitor BMP  Hypokalemia -Resolved with replacement, currently 3.5 -Continue supplementation given IV Lasix -Continue to monitor BMP  Acute on chronic systolic heart failure -With history of AICD -Echocardiogram October 2020 showed an EF of 35 to 40% -Patient with complaints of shortness of breath.  On examination does have upper and lower extremity edema.  Patient did receive volume resuscitation during surgery as well as ERCP -IV fluids have been discontinued -Continue  patient on IV Lasix 40 mg twice daily -Monitor intake and output, daily weights -Cardiology consulted and appreciated  Diabetes mellitus, type II -Continue insulin sliding scale and CBG monitoring  Microcytic anemia -Vitamin B12, folate, TSH are all unremarkable and within normal limits -hemoglobin stable, currently 11.4 -Continue to monitor CBC  Generalized deconditioning -PT recommending home health  DVT Prophylaxis  heparin  Code Status: Full  Family Communication: None at bedside  Disposition Plan: Admitted.  Not treating patient for acute on chronic CHF.  Suspect home within the next 2-3 days.  Consultants Gastroenterology General surgery  Procedures  lap cholecystectomy with IOC.  Patient noted to have  questionable filling defect in the distal CBD  ERCP  Antibiotics  Anti-infectives (From admission, onward)   Start     Dose/Rate Route Frequency Ordered Stop   01/04/19 0430  piperacillin-tazobactam (ZOSYN) IVPB 3.375 g  Status:  Discontinued     3.375 g 12.5 mL/hr over 240 Minutes Intravenous Every 8 hours 01/04/19 0415 01/12/19 1151      Subjective:   [name] seen and examined today.  Denies any further abdominal pain, nausea or vomiting.  Has been passing gas.  Denies current chest pain, shortness of breath, dizziness or headache.  Continues to feel he gets short winded with activity. Objective:   Vitals:   01/11/19 1402 01/11/19 2113 01/11/19 2128 01/12/19 0544  BP: (!) 118/92  123/87 125/86  Pulse: 82 66 94 84  Resp: 18 16 18 18   Temp: 97.6 F (36.4 C)  98.5 F (36.9 C) 98.4 F (36.9 C)  TempSrc: Oral  Oral Oral  SpO2: 99% 93% 96% 96%  Weight:    100 kg  Height:        Intake/Output Summary (Last 24 hours) at 01/12/2019 1253 Last data filed at 01/12/2019 1216 Gross per 24 hour  Intake 1102.92 ml  Output 3325 ml  Net -2222.08 ml   Filed Weights   01/05/19 0500 01/06/19 0500 01/12/19 0544  Weight: 92.9 kg 92.9 kg 100 kg   Exam  General: Well developed, well nourished, NAD, appears stated age  HEENT: NCAT,  mucous membranes moist.   Cardiovascular: S1 S2 auscultated, SEM, irregular  Respiratory: Clear to auscultation bilaterally with equal chest rise  Abdomen: Soft, nontender, nondistended, + bowel sounds  Extremities: warm dry without cyanosis clubbing. Upper/lower ext edema  Neuro: AAOx3, nonfocal  Psych: Appropriate mood and affect, pleasant    Data Reviewed: I have personally reviewed following labs and imaging studies  CBC: Recent Labs  Lab 01/08/19 0404 01/09/19 0500 01/10/19 0451 01/11/19 0500 01/11/19 1648 01/12/19 0631  WBC 7.8 6.3 5.5 5.5 5.6 7.2  NEUTROABS 6.3  --   --  4.3  --   --   HGB 10.6* 10.7* 10.7* 10.4* 10.8*  11.4*  HCT 30.8* 31.9* 31.6* 30.7* 31.2* 32.6*  MCV 103.0* 102.2* 102.6* 101.3* 100.0 99.4  PLT 131* 127* 139* 143* 151 176   Basic Metabolic Panel: Recent Labs  Lab 01/06/19 0318 01/07/19 0319 01/08/19 0404 01/09/19 0500 01/10/19 0451 01/10/19 1115 01/11/19 0500 01/12/19 0631  NA 132* 131* 133* 133* 132*  --  132* 131*  K 3.7 3.3* 3.0* 3.2* 3.7  --  3.8 3.5  CL 97* 99 101 99 98  --  100 95*  CO2 19* 22 22 22 24   --  23 26  GLUCOSE 169* 147* 157* 167* 179*  --  185* 189*  BUN 16 14 11 8  6*  --  11 10  CREATININE 1.07 0.79 0.75 0.76 0.82  --  1.12 1.21  CALCIUM 8.1* 7.9* 7.9* 8.0* 8.2*  --  8.0* 8.1*  MG 2.1 1.9 1.8  --   --  1.8  --  1.8   GFR: Estimated Creatinine Clearance: 55.5 mL/min (by C-G formula based on SCr of 1.21 mg/dL). Liver Function Tests: Recent Labs  Lab 01/08/19 0404 01/09/19 0500 01/10/19 0451 01/11/19 0500 01/12/19 0631  AST 33 472* 282* 107* 55*  ALT 48* 339* 321* 214* 158*  ALKPHOS 99 322* 443* 392* 389*  BILITOT 1.3* 3.2* 4.0* 2.9* 2.4*  PROT 5.2* 5.2* 5.5* 5.3* 5.9*  ALBUMIN 2.4* 2.4* 2.4* 2.3* 2.6*   Recent Labs  Lab 01/07/19  0319  LIPASE 31   No results for input(s): AMMONIA in the last 168 hours. Coagulation Profile: No results for input(s): INR, PROTIME in the last 168 hours. Cardiac Enzymes: No results for input(s): CKTOTAL, CKMB, CKMBINDEX, TROPONINI in the last 168 hours. BNP (last 3 results) No results for input(s): PROBNP in the last 8760 hours. HbA1C: No results for input(s): HGBA1C in the last 72 hours. CBG: Recent Labs  Lab 01/11/19 1101 01/11/19 1615 01/11/19 2125 01/12/19 0758 01/12/19 1220  GLUCAP 248* 212* 235* 257* 161*   Lipid Profile: No results for input(s): CHOL, HDL, LDLCALC, TRIG, CHOLHDL, LDLDIRECT in the last 72 hours. Thyroid Function Tests: No results for input(s): TSH, T4TOTAL, FREET4, T3FREE, THYROIDAB in the last 72 hours. Anemia Panel: No results for input(s): VITAMINB12, FOLATE, FERRITIN,  TIBC, IRON, RETICCTPCT in the last 72 hours. Urine analysis:    Component Value Date/Time   COLORURINE YELLOW 01/03/2019 1417   APPEARANCEUR TURBID (A) 01/03/2019 1417   LABSPEC >1.030 (H) 01/03/2019 1417   PHURINE 6.0 01/03/2019 1417   GLUCOSEU 250 (A) 01/03/2019 1417   HGBUR NEGATIVE 01/03/2019 1417   BILIRUBINUR SMALL (A) 01/03/2019 1417   KETONESUR 15 (A) 01/03/2019 1417   PROTEINUR 100 (A) 01/03/2019 1417   NITRITE NEGATIVE 01/03/2019 1417   LEUKOCYTESUR NEGATIVE 01/03/2019 1417   Sepsis Labs: @LABRCNTIP (procalcitonin:4,lacticidven:4)  ) Recent Results (from the past 240 hour(s))  Urine culture     Status: Abnormal   Collection Time: 01/03/19  2:17 PM   Specimen: Urine, Random  Result Value Ref Range Status   Specimen Description   Final    URINE, RANDOM Performed at [facility], [address] [place], [place] [address]    Special Requests   Final    NONE Performed at [facility], [address] [place], [place] [address]    Culture 20,000 COLONIES/mL ENTEROCOCCUS FAECALIS (A)  Final   Report Status 01/06/2019 FINAL  Final   Organism ID, Bacteria ENTEROCOCCUS FAECALIS (A)  Final      Susceptibility   Enterococcus faecalis - MIC*    AMPICILLIN <=2 SENSITIVE Sensitive     LEVOFLOXACIN 1 SENSITIVE Sensitive     NITROFURANTOIN 32 SENSITIVE Sensitive     VANCOMYCIN 1 SENSITIVE Sensitive     * 20,000 COLONIES/mL ENTEROCOCCUS FAECALIS  SARS CORONAVIRUS 2 (TAT 6-24 HRS) Nasopharyngeal Nasopharyngeal Swab     Status: None   Collection Time: 01/03/19  8:36 PM   Specimen: Nasopharyngeal Swab  Result Value Ref Range Status   SARS Coronavirus 2 NEGATIVE NEGATIVE Final    Comment: (NOTE) SARS-CoV-2 target nucleic acids are NOT DETECTED. The SARS-CoV-2 RNA is generally detectable in upper and lower respiratory specimens during the acute phase of infection. Negative results do not preclude SARS-CoV-2 infection, do not rule out co-infections with  other pathogens, and should not be used as the sole basis for treatment or other patient management decisions. Negative results must be combined with clinical observations, patient history, and epidemiological information. The expected result is Negative. Fact Sheet for Patients: HairSlick.no Fact Sheet for Healthcare Providers: quierodirigir.com This test is not yet approved or cleared by the [place] FDA and  has been authorized for detection and/or diagnosis of SARS-CoV-2 by FDA under an Emergency Use Authorization (EUA). This EUA will remain  in effect (meaning this test can be used) for the duration of the COVID-19 declaration under Section 56 4(b)(1) of the Act, 21 U.S.C. section 360bbb-3(b)(1), unless the authorization  is terminated or revoked sooner. Performed at [facility], [address], [place], [place] [address]   C difficile quick scan w PCR reflex     Status: None   Collection Time: 01/07/19  5:56 PM   Specimen: STOOL  Result Value Ref Range Status   C Diff antigen NEGATIVE NEGATIVE Final   C Diff toxin NEGATIVE NEGATIVE Final   C Diff interpretation No C. difficile detected.  Final    Comment: Performed at [facility], [address], [place], [place] [address]  GI pathogen panel by PCR, stool     Status: None   Collection Time: 01/07/19  5:56 PM  Result Value Ref Range Status   Plesiomonas shigelloides NOT DETECTED NOT DETECTED Final   Yersinia enterocolitica NOT DETECTED NOT DETECTED Final   Vibrio NOT DETECTED NOT DETECTED Final   Enteropathogenic E coli NOT DETECTED NOT DETECTED Final   E coli (ETEC) LT/ST NOT DETECTED NOT DETECTED Final   E coli 0157 by PCR Not applicable NOT DETECTED Final   Cryptosporidium by PCR NOT DETECTED NOT DETECTED Final   Entamoeba histolytica NOT DETECTED NOT DETECTED Final   Adenovirus F 40/41 NOT DETECTED NOT DETECTED Final   Norovirus GI/GII NOT DETECTED  NOT DETECTED Final   Sapovirus NOT DETECTED NOT DETECTED Final    Comment: (NOTE) Performed At: [facility] [address] [place], [place] [id number] Jolene Schimke MD MW:[phone]    Vibrio cholerae NOT DETECTED NOT DETECTED Final   Campylobacter by PCR NOT DETECTED NOT DETECTED Final   Salmonella by PCR NOT DETECTED NOT DETECTED Final   E coli (STEC) NOT DETECTED NOT DETECTED Final   Enteroaggregative E coli NOT DETECTED NOT DETECTED Final   Shigella by PCR NOT DETECTED NOT DETECTED Final   Cyclospora cayetanensis NOT DETECTED NOT DETECTED Final   Astrovirus NOT DETECTED NOT DETECTED Final   G lamblia by PCR NOT DETECTED NOT DETECTED Final   Rotavirus A by PCR NOT DETECTED NOT DETECTED Final  Surgical PCR screen     Status: None   Collection Time: 01/08/19  6:51 AM   Specimen: Nasal Mucosa; Nasal Swab  Result Value Ref Range Status   MRSA, PCR NEGATIVE NEGATIVE Final   Staphylococcus aureus NEGATIVE NEGATIVE Final    Comment: (NOTE) The Xpert SA Assay (FDA approved for NASAL specimens in patients 2 years of age and older), is one component of a comprehensive surveillance program. It is not intended to diagnose infection nor to guide or monitor treatment. Performed at [facility], [address], [place], [place] [address]       Radiology Studies: No results found.   Scheduled Meds: . acetaminophen  1,000 mg Oral Q8H  . apixaban  5 mg Oral BID  . arformoterol  15 mcg Nebulization BID  . dofetilide  250 mcg Oral BID  . furosemide  40 mg Intravenous BID  . insulin aspart  0-5 Units Subcutaneous QHS  . insulin aspart  0-9 Units Subcutaneous TID WC  . metoprolol succinate  50 mg Oral Daily  . pantoprazole  40 mg Oral Daily  . potassium chloride  40 mEq Oral BID  . sodium chloride flush  10-40 mL Intracatheter Q12H  . umeclidinium bromide  1 puff Inhalation Daily   Continuous Infusions: . lactated ringers 10 mL/hr at 01/08/19 0925     LOS: 9 days    Time Spent in minutes  45 minutes  Sheela Mcculley D.O. on  01/12/2019 at 12:53 PM  Between 7am to 7pm - Please see pager noted on [email or web address]  After 7pm go to [email or web address]  And look for the night coverage person covering for me after hours  [facility] Office  [phone]

## 2019-01-12 NOTE — Progress Notes (Signed)
Progress Note  Patient Name: [name] Date of Encounter: 01/12/2019  Primary Cardiologist: Sherren Mocha, MD   Subjective   Breathing better - wound drainage has stopped. Transition heparin to Eliquis per surgery. Diuresed 2.2L negative overnight-  Weight today is 100Kg (admission weight was 93 kg). Creatinine 1.12 -> 1.21 today (monitor).  Inpatient Medications    Scheduled Meds: . acetaminophen  1,000 mg Oral Q8H  . arformoterol  15 mcg Nebulization BID  . dofetilide  250 mcg Oral BID  . furosemide  40 mg Intravenous BID  . insulin aspart  0-5 Units Subcutaneous QHS  . insulin aspart  0-9 Units Subcutaneous TID WC  . metoprolol succinate  50 mg Oral Daily  . pantoprazole  40 mg Oral Daily  . sodium chloride flush  10-40 mL Intracatheter Q12H  . umeclidinium bromide  1 puff Inhalation Daily   Continuous Infusions: . heparin 1,400 Units/hr (01/12/19 0732)  . lactated ringers 10 mL/hr at 01/08/19 0925  . piperacillin-tazobactam (ZOSYN)  IV 3.375 g (01/12/19 0631)   PRN Meds: alum & mag hydroxide-simeth, ipratropium-albuterol, metoCLOPramide (REGLAN) injection, morphine injection, morphine injection, oxyCODONE, sodium chloride flush   Vital Signs    Vitals:   01/11/19 1402 01/11/19 2113 01/11/19 2128 01/12/19 0544  BP: (!) 118/92  123/87 125/86  Pulse: 82 66 94 84  Resp: 18 16 18 18   Temp: 97.6 F (36.4 C)  98.5 F (36.9 C) 98.4 F (36.9 C)  TempSrc: Oral  Oral Oral  SpO2: 99% 93% 96% 96%  Weight:    100 kg  Height:        Intake/Output Summary (Last 24 hours) at 01/12/2019 1006 Last data filed at 01/12/2019 0841 Gross per 24 hour  Intake 1102.92 ml  Output 2650 ml  Net -1547.08 ml   Last 3 Weights 01/12/2019 01/06/2019 01/05/2019  Weight (lbs) 220 lb 7.4 oz 204 lb 12.9 oz 204 lb 12.9 oz  Weight (kg) 100 kg 92.9 kg 92.9 kg      Telemetry    Mostly V pacing 80's-90's - Personally Reviewed  ECG    Intermittent V pacing - Non-paced QTC ~510  msec (590 msec paced beats) - personally reviewed  Physical Exam   GEN:  Awake, breathing better Neck: JVP elevated to 3 cm Cardiac: RRR, no murmurs, rubs, or gallops.  Respiratory: diminished breath sounds and bibasilar crackles GI: Soft, mild tenderness MS: 1+ LE edema Neuro:  Nonfocal  Psych: Normal affect   Labs    High Sensitivity Troponin:   Recent Labs  Lab 01/03/19 1548 01/03/19 1755 01/04/19 0500 01/04/19 0844 01/06/19 1209  TROPONINIHS 10 11 20* 15 24*      Chemistry Recent Labs  Lab 01/10/19 0451 01/11/19 0500 01/12/19 0631  NA 132* 132* 131*  K 3.7 3.8 3.5  CL 98 100 95*  CO2 24 23 26   GLUCOSE 179* 185* 189*  BUN 6* 11 10  CREATININE 0.82 1.12 1.21  CALCIUM 8.2* 8.0* 8.1*  PROT 5.5* 5.3* 5.9*  ALBUMIN 2.4* 2.3* 2.6*  AST 282* 107* 55*  ALT 321* 214* 158*  ALKPHOS 443* 392* 389*  BILITOT 4.0* 2.9* 2.4*  GFRNONAA >60 60* 54*  GFRAA >60 >60 >60  ANIONGAP 10 9 10      Hematology Recent Labs  Lab 01/11/19 0500 01/11/19 1648 01/12/19 0631  WBC 5.5 5.6 7.2  RBC 3.03* 3.12* 3.28*  HGB 10.4* 10.8* 11.4*  HCT 30.7* 31.2* 32.6*  MCV 101.3* 100.0 99.4  MCH  34.3* 34.6* 34.8*  MCHC 33.9 34.6 35.0  RDW 13.4 13.8 13.8  PLT 143* 151 176    BNP No results for input(s): BNP, PROBNP in the last 168 hours.   DDimer No results for input(s): DDIMER in the last 168 hours.   Radiology    No results found.  Cardiac Studies    Echo 11/05/18 1. Left ventricular ejection fraction, by visual estimation, is 35 to 40%. The left ventricle has moderately decreased function. There is global left ventricular hypokinesis and marked apical-basal and septal-lateral dyssynchrony. There appears to be  apical akinesis. 2. Abnormal septal motion consistent with RV pacemaker. 3. Global right ventricle has moderately reduced systolic function.The right ventricular size is normal. No increase in right ventricular wall thickness. 4. Left atrial size was severely  dilated. 5. Right atrial size was moderately dilated. 6. Mild mitral valve regurgitation. Mild-moderate mitral stenosis. Peak and mean gradients are 16 and 5 mm Hg, respectively, at 70 bpm. Estimated valve area is 1.4 cm sq by the PHT method. 7. Moderate calcification of the mitral valve leaflet(s). 8. Severe mitral annular calcification. 9. Severe thickening of the mitral valve leaflet(s). 10. The tricuspid valve is normal in structure. Tricuspid valve regurgitation is mild-moderate. 11. The aortic valve is normal in structure. Aortic valve regurgitation was not visualized by color flow Doppler. 12. The pulmonic valve was normal in structure. Pulmonic valve regurgitation is mild by color flow Doppler. 13. Mildly elevated pulmonary artery systolic pressure. 14. A pacer wire is visualized. 15. The inferior vena cava is dilated in size with >50% respiratory variability, suggesting right atrial pressure of 8 mmHg. 16. Left ventricular diastolic function could not be evaluated due to atrial fibrillation and mitral stenosis. 74. Compared to February 2020, mitral valve gradients are worse, probably due to new atrial fibrillation and increased ventricular rate.  Cardiac cath 11/05/2016 1. Severe 2 vessel CAD with continued patency of the LIMA-LAD and right radial graft-OM 2. Moderate, severely calcified, proximal circumflex stenosis with negative FFR assessment 3. Normal right heart hemodynamics except for a prominent V wave in the PCWP tracing 4. Normal/low LVEDP  Recommend: ongoing medical therapy. Pt appears to be well-compensated from a hemodynamic perspective.  Patient Profile     85 y.o. male with a hx of CAD s/p CABG, mitral valve repair, polymorphic VT s/p ICD Med[employer]onic dual chamber, PFO closure, Persistent afib on Eliquis, Chronic systolic and diastolic HF (EF [id number]), HTN, DM, SIADH, carotid artery disease s/p R CEA 10/2018 and HLDwho is being seen today for the evaluation  ofpre-operative cardiac evaluation for acute pancreatitis.  Assessment & Plan    Paroxysmal atrial fibrillation -Pt continues on [name], has rash with amiodarone recently. Continues on Toprol XL.  -[name] dose was reduced to 250 mcg due to prolonged QTC up to 590. QTC improved on lower dose to 482. Today is 540. K+ has been as low as 3, K= is 3.7 today. Will give KDur 40 mEq today.  -Keep K+ above 4 and Mg above 2. Mag was low 1.7 on 12/6 and mag was given. Mag was last checked, 1.8 on 12/9. I will check Mag today.  -EKG is reading atrial fibrillation, can see some p waves, ?could be sinus.  -Transition to Eliquis today per surgery.  AICD in place -Hx of polymorphic VT.  -As above, keep K+>4 and Mg >2.   Infrarenal abdominal aortic aneurysm 5.7 mm with no dissection -Vascular plan for surgery after this current issue resolved.  Acute on chronic systolic heart failure -LVEF 35-40% in 10/2018. Has ICD in place -On BB at home..  -Volume overloaded due to IVF's - +9L -Continue lasix diuresis today. He is net negative, but remains 7 kg over admit weight.     For questions or updates, please contact [place] Please consult [email or web address] for contact info under     Pixie Casino, MD, FACC, [place] Director of the [facility] Diplomate of the American Board of Clinical Lipidology Attending Cardiologist  Direct Dial: [phone]  Fax: [phone]  Website:  [email or web address]  Pixie Casino, MD  01/12/2019, 10:06 AM

## 2019-01-13 LAB — COMPREHENSIVE METABOLIC PANEL
ALT: 116 U/L — ABNORMAL HIGH (ref 0–44)
AST: 35 U/L (ref 15–41)
Albumin: 2.4 g/dL — ABNORMAL LOW (ref 3.5–5.0)
Alkaline Phosphatase: 323 U/L — ABNORMAL HIGH (ref 38–126)
Anion gap: 9 (ref 5–15)
BUN: 8 mg/dL (ref 8–23)
CO2: 27 mmol/L (ref 22–32)
Calcium: 8.2 mg/dL — ABNORMAL LOW (ref 8.9–10.3)
Chloride: 95 mmol/L — ABNORMAL LOW (ref 98–111)
Creatinine, Ser: 0.89 mg/dL (ref 0.61–1.24)
GFR calc Af Amer: >60 mL/min (ref >60)
GFR calc non Af Amer: >60 mL/min (ref >60)
Glucose, Bld: 222 mg/dL — ABNORMAL HIGH (ref 70–99)
Potassium: 3.6 mmol/L (ref 3.5–5.1)
Sodium: 131 mmol/L — ABNORMAL LOW (ref 135–145)
Total Bilirubin: 1.6 mg/dL — ABNORMAL HIGH (ref 0.3–1.2)
Total Protein: 5.6 g/dL — ABNORMAL LOW (ref 6.5–8.1)

## 2019-01-13 LAB — CBC
HCT: 30.8 % — ABNORMAL LOW (ref 39.0–52.0)
Hemoglobin: 10.5 g/dL — ABNORMAL LOW (ref 13.0–17.0)
MCH: 34.1 pg — ABNORMAL HIGH (ref 26.0–34.0)
MCHC: 34.1 g/dL (ref 30.0–36.0)
MCV: 100 fL (ref 80.0–100.0)
Platelets: 179 10*3/uL (ref 150–400)
RBC: 3.08 MIL/uL — ABNORMAL LOW (ref 4.22–5.81)
RDW: 14.3 % (ref 11.5–15.5)
WBC: 6.7 10*3/uL (ref 4.0–10.5)
nRBC: 0 % (ref 0.0–0.2)

## 2019-01-13 LAB — GLUCOSE, CAPILLARY
Glucose-Capillary: 228 mg/dL — ABNORMAL HIGH (ref 70–99)
Glucose-Capillary: 166 mg/dL — ABNORMAL HIGH (ref 70–99)
Glucose-Capillary: 272 mg/dL — ABNORMAL HIGH (ref 70–99)
Glucose-Capillary: 188 mg/dL — ABNORMAL HIGH (ref 70–99)

## 2019-01-13 LAB — MAGNESIUM: Magnesium: 1.6 mg/dL — ABNORMAL LOW (ref 1.7–2.4)

## 2019-01-13 MED ORDER — ACETAMINOPHEN 500 MG PO TABS: 1000.0000 mg | ORAL_TABLET | Freq: Three times a day (TID) | ORAL | 0 refills | Status: DC | PRN

## 2019-01-13 MED ORDER — MAGNESIUM SULFATE 2 GM/50ML IV SOLN
2.0000 g | Freq: Once | INTRAVENOUS | Status: AC
  Administered 2019-01-13: 2 g via INTRAVENOUS
  Filled 2019-01-13: qty 50

## 2019-01-13 MED ORDER — POTASSIUM CHLORIDE CRYS ER 20 MEQ PO TBCR
40.0000 meq | EXTENDED_RELEASE_TABLET | Freq: Three times a day (TID) | ORAL | Status: AC
  Administered 2019-01-13 – 2019-01-14 (×3): 40 meq via ORAL
  Filled 2019-01-13 (×3): qty 2

## 2019-01-13 NOTE — Care Management Important Message (Signed)
Important Message  Patient Details  Name: [name] MRN: [id number] Date of Birth: Jun 02, 1931   Medicare Important Message Given:  Yes     Memory Argue 01/13/2019, 3:17 PM    IM  SIGNED BY PATIENT

## 2019-01-13 NOTE — Progress Notes (Signed)
Progress Note  Patient Name: [name] Date of Encounter: 01/13/2019  Primary Cardiologist: Sherren Mocha, MD   Subjective  Feels well today.   Inpatient Medications    Scheduled Meds: . acetaminophen  1,000 mg Oral Q8H  . apixaban  5 mg Oral BID  . arformoterol  15 mcg Nebulization BID  . dofetilide  250 mcg Oral BID  . furosemide  40 mg Intravenous BID  . insulin aspart  0-5 Units Subcutaneous QHS  . insulin aspart  0-9 Units Subcutaneous TID WC  . metoprolol succinate  50 mg Oral Daily  . pantoprazole  40 mg Oral Daily  . potassium chloride  40 mEq Oral TID  . sodium chloride flush  10-40 mL Intracatheter Q12H  . umeclidinium bromide  1 puff Inhalation Daily   Continuous Infusions: . lactated ringers 10 mL/hr at 01/08/19 0925   PRN Meds: alum & mag hydroxide-simeth, ipratropium-albuterol, metoCLOPramide (REGLAN) injection, morphine injection, morphine injection, oxyCODONE, sodium chloride flush   Vital Signs    Vitals:   01/13/19 0745 01/13/19 0825 01/13/19 0900 01/13/19 1429  BP:    112/75  Pulse:    82  Resp:    18  Temp:    98.1 F (36.7 C)  TempSrc:    Oral  SpO2:  94%  99%  Weight: 98.4 kg  97 kg   Height:        Intake/Output Summary (Last 24 hours) at 01/13/2019 1542 Last data filed at 01/13/2019 1430 Gross per 24 hour  Intake 1190 ml  Output 4451 ml  Net -3261 ml   Last 3 Weights 01/13/2019 01/13/2019 01/12/2019  Weight (lbs) 213 lb 13.5 oz 216 lb 14.9 oz 220 lb 7.4 oz  Weight (kg) 97 kg 98.4 kg 100 kg      Telemetry    V pacing 80's-90's, possible underlying atrial flutter with small flutter wave seen similar to ECG on 01/10/2019  - Personally Reviewed  ECG    No new.  ECGs reviewed for QTc  Physical Exam   Constitutional: No acute distress Eyes: sclera non-icteric, normal conjunctiva and lids ENMT: normal dentition, moist mucous membranes Cardiovascular: regular rhythm, normal rate, no murmurs. S1 and S2 normal. Radial  pulses normal bilaterally. No jugular venous distention.  Respiratory: clear to auscultation bilaterally GI : soft, bs + MSK: extremities warm, well perfused. 1+ bilateral edema.  NEURO: grossly nonfocal exam, moves all extremities. PSYCH: alert and oriented x 3, normal mood and affect.   Labs    High Sensitivity Troponin:   Recent Labs  Lab 01/03/19 1548 01/03/19 1755 01/04/19 0500 01/04/19 0844 01/06/19 1209  TROPONINIHS 10 11 20* 15 24*      Chemistry Recent Labs  Lab 01/11/19 0500 01/12/19 0631 01/13/19 0304  NA 132* 131* 131*  K 3.8 3.5 3.6  CL 100 95* 95*  CO2 23 26 27   GLUCOSE 185* 189* 222*  BUN 11 10 8   CREATININE 1.12 1.21 0.89  CALCIUM 8.0* 8.1* 8.2*  PROT 5.3* 5.9* 5.6*  ALBUMIN 2.3* 2.6* 2.4*  AST 107* 55* 35  ALT 214* 158* 116*  ALKPHOS 392* 389* 323*  BILITOT 2.9* 2.4* 1.6*  GFRNONAA 60* 54* >60  GFRAA >60 >60 >60  ANIONGAP 9 10 9      Hematology Recent Labs  Lab 01/11/19 1648 01/12/19 0631 01/13/19 0304  WBC 5.6 7.2 6.7  RBC 3.12* 3.28* 3.08*  HGB 10.8* 11.4* 10.5*  HCT 31.2* 32.6* 30.8*  MCV 100.0 99.4 100.0  MCH 34.6* 34.8* 34.1*  MCHC 34.6 35.0 34.1  RDW 13.8 13.8 14.3  PLT 151 176 179    BNP No results for input(s): BNP, PROBNP in the last 168 hours.   DDimer No results for input(s): DDIMER in the last 168 hours.   Radiology    No results found.  Cardiac Studies    Echo 11/05/18 1. Left ventricular ejection fraction, by visual estimation, is 35 to 40%. The left ventricle has moderately decreased function. There is global left ventricular hypokinesis and marked apical-basal and septal-lateral dyssynchrony. There appears to be  apical akinesis. 2. Abnormal septal motion consistent with RV pacemaker. 3. Global right ventricle has moderately reduced systolic function.The right ventricular size is normal. No increase in right ventricular wall thickness. 4. Left atrial size was severely dilated. 5. Right atrial size was  moderately dilated. 6. Mild mitral valve regurgitation. Mild-moderate mitral stenosis. Peak and mean gradients are 16 and 5 mm Hg, respectively, at 70 bpm. Estimated valve area is 1.4 cm sq by the PHT method. 7. Moderate calcification of the mitral valve leaflet(s). 8. Severe mitral annular calcification. 9. Severe thickening of the mitral valve leaflet(s). 10. The tricuspid valve is normal in structure. Tricuspid valve regurgitation is mild-moderate. 11. The aortic valve is normal in structure. Aortic valve regurgitation was not visualized by color flow Doppler. 12. The pulmonic valve was normal in structure. Pulmonic valve regurgitation is mild by color flow Doppler. 13. Mildly elevated pulmonary artery systolic pressure. 14. A pacer wire is visualized. 15. The inferior vena cava is dilated in size with >50% respiratory variability, suggesting right atrial pressure of 8 mmHg. 16. Left ventricular diastolic function could not be evaluated due to atrial fibrillation and mitral stenosis. 34. Compared to February 2020, mitral valve gradients are worse, probably due to new atrial fibrillation and increased ventricular rate.  Cardiac cath 11/05/2016 1. Severe 2 vessel CAD with continued patency of the LIMA-LAD and right radial graft-OM 2. Moderate, severely calcified, proximal circumflex stenosis with negative FFR assessment 3. Normal right heart hemodynamics except for a prominent V wave in the PCWP tracing 4. Normal/low LVEDP  Recommend: ongoing medical therapy. Pt appears to be well-compensated from a hemodynamic perspective.  Patient Profile     85 y.o. male with a hx of CAD s/p CABG, mitral valve repair, polymorphic VT s/p ICD [employer] dual chamber, PFO closure, Persistent afib on Eliquis, Chronic systolic and diastolic HF ([id number]), HTN, DM, SIADH, carotid artery disease s/p R CEA 10/2018 and HLDwho is being seen today for the evaluation ofpre-operative cardiac evaluation for  acute pancreatitis.  Assessment & Plan    Paroxysmal atrial fibrillation -Pt continues on [name], has rash with amiodarone recently. Continues on Toprol XL.  -[name] dose was reduced to 250 mcg due to prolonged QTC up to 590. QTC improved on lower dose to 482. Will reevaluate with ECG today, appears stable on review of previous ECGs. -  K+ has been as low as 3, K= is 3.6 today. Repletion recommended. - Keep K+ above 4 and Mg above 2.  - Appears to occasionally have a flutter wave on telemetry. -Transition to Eliquis today per surgery.  AICD in place -Hx of polymorphic VT.  -As above, keep K+>4 and Mg >2.   Infrarenal abdominal aortic aneurysm 5.7 mm with no dissection -Vascular plan for surgery after this current issue resolved.   Acute on chronic systolic heart failure -LVEF 35-40% in 10/2018. Has ICD in place -On BB at home.Marland Kitchen  -  Volume overloaded due to IVF's -Continue lasix diuresis today. He is net negative, but remains 4-5 kg over admit weight. LE swelling still seen. -wrap legs  - can likely transition to oral diuresis tomorrow. He takes prn lasix at home, would recommend scheduled lasix for at least 3-5 days post dismissal, then can be evaluated as an outpatient for transition to prn.   For questions or updates, please contact [facility] Please consult [email or web address] for contact info under    Elouise Munroe, MD  01/13/2019, 3:42 PM

## 2019-01-13 NOTE — Progress Notes (Signed)
Physical Therapy Treatment Patient Details Name: [name] MRN: [id number] DOB: [birth year] Today's Date: 01/13/2019    History of Present Illness 85 yo male admitted for lower abdominal pain and acute pancreatitis. Pt s/p lap cholecystectomy and appendectomy 12/9. Planned ERCP for 12/11. PMH including but not limited to COPD, SOB, orthopnea, CHF, hyponatremia, V-tach, PAF, ICD placement, DM, mitral valve replacement, knee arthroscopy, CABG, cardiac cath, A-fib    PT Comments    Pt OOB in recliner upon PT arrival, agreeable to PT session focused on progressing mobility this am. The pt was able to demo improvements in ambulation endurance as well as good dynamic balance when challenged with head turns and direction changes while ambulating. The patient completed a 5xsit-to-stand (5XSTS) in 16s with no AD and supervision. This is above the age-based cut-off of 15s and therefore indicates they are at increased risk for falls. The patient will therefore continue to benefit from skilled PT to address limitations in functional strength, mobility, and balance. The pt was also educated in general LE strengthening exercises and HEP he can complete both in his hospital room and at home following d/c. PT will continue to follow acutely.    Follow Up Recommendations  Home health PT     Equipment Recommendations  None recommended by PT    Recommendations for Other Services       Precautions / Restrictions Precautions Precautions: Fall Restrictions Weight Bearing Restrictions: No    Mobility  Bed Mobility               General bed mobility comments: Pt OOB in recliner upon PT arrival  Transfers Overall transfer level: Needs assistance Equipment used: Rolling walker (2 wheeled);None Transfers: Sit to/from Stand Sit to Stand: Supervision         General transfer comment: Cues for hand placement to and from seated surface. Able to complete 5X STS in 16 sec without use of  AD  Ambulation/Gait Ambulation/Gait assistance: Min guard;Supervision Gait Distance (Feet): 250 Feet Assistive device: Rolling walker (2 wheeled) Gait Pattern/deviations: Step-through pattern;Decreased stride length;Trunk flexed Gait velocity: 0.5 m/s Gait velocity interpretation: <1.8 ft/sec, indicate of risk for recurrent falls General Gait Details: Cues for upper trunk control.  Pt able to increase gt distance as pain is better controlled,   Stairs             Wheelchair Mobility    Modified Rankin (Stroke Patients Only)       Balance Overall balance assessment: No apparent balance deficits (not formally assessed)                                          Cognition Arousal/Alertness: Awake/alert Behavior During Therapy: WFL for tasks assessed/performed Overall Cognitive Status: Within Functional Limits for tasks assessed                                        Exercises General Exercises - Lower Extremity Long Arc Quad: Both;Seated;15 reps;Strengthening Hip Flexion/Marching: Both;10 reps;Seated;AROM Heel Raises: Strengthening;Both;Seated;20 reps    General Comments General comments (skin integrity, edema, etc.): Able to perform head turns and quick changes in direction while ambulating, no LOB      Pertinent Vitals/Pain Pain Assessment: No/denies pain Pain Intervention(s): Monitored during session    Home Living  Prior Function            PT Goals (current goals can now be found in the care plan section) Acute Rehab PT Goals Patient Stated Goal: to go home PT Goal Formulation: With patient Time For Goal Achievement: 01/21/19 Potential to Achieve Goals: Good Progress towards PT goals: Progressing toward goals    Frequency    Min 3X/week      PT Plan Current plan remains appropriate    Co-evaluation              AM-PAC PT "6 Clicks" Mobility   Outcome Measure  Help  needed turning from your back to your side while in a flat bed without using bedrails?: A Little Help needed moving from lying on your back to sitting on the side of a flat bed without using bedrails?: A Little Help needed moving to and from a bed to a chair (including a wheelchair)?: A Little Help needed standing up from a chair using your arms (e.g., wheelchair or bedside chair)?: A Little Help needed to walk in hospital room?: A Little Help needed climbing 3-5 steps with a railing? : A Lot 6 Click Score: 17    End of Session Equipment Utilized During Treatment: Gait belt Activity Tolerance: Patient limited by pain;Patient limited by fatigue Patient left: in chair;with call bell/phone within reach Nurse Communication: Mobility status PT Visit Diagnosis: Difficulty in walking, not elsewhere classified (R26.2);Other abnormalities of gait and mobility (R26.89)     Time: [id number] PT Time Calculation (min) (ACUTE ONLY): 20 min  Charges:  $Gait Training: 8-22 mins                     Karma Ganja, PT, DPT   Acute Rehabilitation Department [phone]   Otho Bellows 01/13/2019, 1:03 PM

## 2019-01-13 NOTE — Progress Notes (Signed)
3 Days Post-Op    CC: Abdominal pain  Subjective: Patient is getting diuresed.  He had a huge abdominal dressing on his abdomen when I came in this morning.  I took the dressing down he is got 1 port site that looks like it is draining a little bit of serous fluid he is got some skin tears from the tape, and one small bleb that is going to breakdown.  The other port sites look fine.  He is tolerating diet bowel movements are normal.  Objective: Vital signs in last 24 hours: Temp:  [97.5 F (36.4 C)-98.3 F (36.8 C)] 98.3 F (36.8 C) (12/14 0524) Pulse Rate:  [74-81] 74 (12/14 0524) Resp:  [16-18] 18 (12/14 0524) BP: (103-129)/(70-77) 129/77 (12/14 0524) SpO2:  [94 %-97 %] 94 % (12/14 0524) Weight:  [98.4 kg] 98.4 kg (12/14 0745) Last BM Date: 01/12/19  Intake/Output from previous day: 12/13 0701 - 12/14 0700 In: 656 [P.O.:656] Out: 4276 [Urine:4275; Stool:1] Intake/Output this shift: No intake/output data recorded.  General appearance: alert, cooperative and no distress Resp: clear to auscultation bilaterally GI: Soft he is got some serous drainage from one port site, he is got some skin tears from tape and a blister.  The other port sites look fine.  He is tolerating diet and having bowel movements.  Lab Results:  Recent Labs    01/12/19 0631 01/13/19 0304  WBC 7.2 6.7  HGB 11.4* 10.5*  HCT 32.6* 30.8*  PLT 176 179    BMET Recent Labs    01/12/19 0631 01/13/19 0304  NA 131* 131*  K 3.5 3.6  CL 95* 95*  CO2 26 27  GLUCOSE 189* 222*  BUN 10 8  CREATININE 1.21 0.89  CALCIUM 8.1* 8.2*   PT/INR No results for input(s): LABPROT, INR in the last 72 hours.  Recent Labs  Lab 01/09/19 0500 01/10/19 0451 01/11/19 0500 01/12/19 0631 01/13/19 0304  AST 472* 282* 107* 55* 35  ALT 339* 321* 214* 158* 116*  ALKPHOS 322* 443* 392* 389* 323*  BILITOT 3.2* 4.0* 2.9* 2.4* 1.6*  PROT 5.2* 5.5* 5.3* 5.9* 5.6*  ALBUMIN 2.4* 2.4* 2.3* 2.6* 2.4*     Lipase      Component Value Date/Time   LIPASE 31 01/07/2019 0319     Medications: . acetaminophen  1,000 mg Oral Q8H  . apixaban  5 mg Oral BID  . arformoterol  15 mcg Nebulization BID  . dofetilide  250 mcg Oral BID  . furosemide  40 mg Intravenous BID  . insulin aspart  0-5 Units Subcutaneous QHS  . insulin aspart  0-9 Units Subcutaneous TID WC  . metoprolol succinate  50 mg Oral Daily  . pantoprazole  40 mg Oral Daily  . sodium chloride flush  10-40 mL Intracatheter Q12H  . umeclidinium bromide  1 puff Inhalation Daily    Assessment/Plan Atypical chest pain Chronic systolic congestive heart failure EF 35-40% - cardiology following: Class III risk10.1% risk of death/MI/cardiac arrest; OK for surgery Paroxysmal atrial fibrillation-rate control focusing/Toprol AICD Chronic anticoagulation on Eliquis Right CE 11/07/18 Infrarenal abdominal aortic aneurysm 5.7 cm Hyponatremia with history of SIADH Type 2 diabetes 5.7 cm abdominal aortic aneurysm-VascularSurgeryfollowing Hx tobacco use    Gallstone pancreatitis/Probable choledocholithiasis/chronicappendicitis Laparoscopic cholecystectomy with intraoperative cholangiogram, appendectomy, 01/08/2019 Dr. Donnie Mesa -IOC positive for distal common bile duct obstruction with stone - ERCP 01/10/2019 Dr. Lizbeth Bark  FEN:IV fluids/clear liquids ID: [id number] >> day7 DVT: Heparin dripon hold Follow up:TBD  Plan:  I put a Steri-Strip/benzoin over the site that has some serous fluid draining from it.  I put some soft dressings over the other sites.  We will recheck to make sure he is not still draining from the one port site later today.  Medicine is diuresing him and he is undergoing PT and OT today.  Follow-up is in the AVS.      LOS: 10 days    Brenleigh Collet 01/13/2019 Please see Amion

## 2019-01-13 NOTE — Progress Notes (Signed)
PROGRESS NOTE    [name]  QMV:[id number] DOB: [birth year] DOA: 01/03/2019 PCP: Joycelyn Rua, MD   Brief Narrative:  HPI On 01/04/2019 by Dr. Midge Minium [name] is a 85 y.o. male with history of CAD status post CABG, chronic systolic heart failure status post AICD placement, A. fib on apixaban and [name] had a reaction to amiodarone recently, recent carotid endarterectomy admitted recently for CHF in October 2 months ago presents to the ER with complaint of severe abdominal pain over the last 2 days with no nausea vomiting or diarrhea.  Abdominal pain is mostly in the lower part of the abdomen.  Likely.  Denies any fever chills or chest pain or shortness of breath.  Abdominal pain is dull severe pain not associated with food.  Interim history Patient admitted for acute biliary pancreatitis, status post cholecystectomy.  Patient did have ERCP. Now with volume overload.  Assessment & Plan   Acute biliary pancreatitis/choledocholithiasis with elevated LFTs -Patient will call us lithiasis with dilated CBD -CT scan on admission showed features concerning for acute pancreatitis with dilated CBD and gallstones with ductal enhancement concerning for infection -Initially managed by supportive care, bowel rest and IV fluids -[facility] and general surgery consulted -Patient was placed on Zosyn -Repeat CT abdomen on 01/06/2019 showed acute pancreatitis without necrosis, distended gallbladder with stones and CBD dilatation up to 30 mm with question of appendicitis as well -General surgery performed lap cholecystectomy with IOC.  Patient noted to have questionable filling defect in the distal CBD -MRCP could not be done as patient has pacemaker -Status post ERCP today, showing choledocholithiasis.  Complete removal was accomplished by biliary sphincterotomy and balloon extraction.  GI recommended clear liquid diet for 6 hours and may advance as tolerated thereafter.   Recommended restarting heparin on 12/12. Will restart Eliquis.  -LFTs trending downward -Tolerated soft diet, advancing to heart healthy/carb mod  Infrarenal abdominal aortic aneurysm -Measuring 5.7 cm no dissection -Patient will need elective vascular surgical intervention once gallstone pancreatitis issues have resolved -Vascular surgery was consulted and appreciated  Paroxysmal atrial fibrillation -Currently rate controlled -Eliquis held-we will likely restart in 24 to 48 hours -was placed on IV heparin, will restart Eliquis today -Continue [name] and metoprolol -magnesium 1.6- will replace, goal of 2; potassium goal of 4 -Cardiology consulted and appreciated  Hyponatremia -Chronic. Patient with recent diagnosis of SIADH -Sodium currently 131- stable -Continue monitor BMP  Hypokalemia -Resolved with replacement, currently 3.6 -Continue supplementation given IV Lasix -Continue to monitor BMP  Acute on chronic systolic heart failure -With history of AICD -Echocardiogram October 2020 showed an EF of 35 to 40% -Patient with complaints of shortness of breath.  On examination does have upper and lower extremity edema.  Patient did receive volume resuscitation during surgery as well as ERCP -IV fluids have been discontinued -Continue  patient on IV Lasix 40 mg twice daily -Monitor intake and output, daily weights -weight down 3kg, urine output over the past 24hrs 4.426L -Cardiology consulted and appreciated  Diabetes mellitus, type II -Continue insulin sliding scale and CBG monitoring  Microcytic anemia -Vitamin B12, folate, TSH are all unremarkable and within normal limits -hemoglobin stable, currently 10.5 -Continue to monitor CBC  Generalized deconditioning -PT recommending home health  DVT Prophylaxis  heparin  Code Status: Full  Family Communication: None at bedside  Disposition Plan: Admitted.  Not treating patient for acute on chronic CHF.  Suspect home  within the next 2-3 days.  Consultants Gastroenterology General surgery  Procedures  lap cholecystectomy with IOC.  Patient noted to have questionable filling defect in the distal CBD  ERCP  Antibiotics   Anti-infectives (From admission, onward)   Start     Dose/Rate Route Frequency Ordered Stop   01/04/19 0430  piperacillin-tazobactam (ZOSYN) IVPB 3.375 g  Status:  Discontinued     3.375 g 12.5 mL/hr over 240 Minutes Intravenous Every 8 hours 01/04/19 0415 01/12/19 1151      Subjective:   [name] seen and examined today.  States he had some drainage from surgical site.  Denies current abdominal pain, nausea or vomiting, chest pain, dizziness or headache.  States he is tolerating diet well.  His breathing is improved. Objective:   Vitals:   01/13/19 0524 01/13/19 0745 01/13/19 0825 01/13/19 0900  BP: 129/77     Pulse: 74     Resp: 18     Temp: 98.3 F (36.8 C)     TempSrc: Oral     SpO2: 94%  94%   Weight:  98.4 kg  97 kg  Height:        Intake/Output Summary (Last 24 hours) at 01/13/2019 1238 Last data filed at 01/13/2019 1100 Gross per 24 hour  Intake 840 ml  Output 4451 ml  Net -3611 ml   Filed Weights   01/12/19 0544 01/13/19 0745 01/13/19 0900  Weight: 100 kg 98.4 kg 97 kg   Exam  General: Well developed, well nourished, NAD, appears stated age  HEENT: NCAT, mucous membranes moist.   Cardiovascular: S1 S2 auscultated, SEM, irregular  Respiratory: Clear to auscultation bilaterally with equal chest rise  Abdomen: Soft, nontender, nondistended, + bowel sounds, some drainage noted from one surgical incision site, some skin tears from tape, blister  Extremities: warm dry without cyanosis clubbing. 2+ LE edema.  Upper extremity edema improved.  Neuro: AAOx3, nonfocal  Psych: Pleasant, appropriate mood and affect  Data Reviewed: I have personally reviewed following labs and imaging studies  CBC: Recent Labs  Lab 01/08/19 0404 01/10/19 0451  01/11/19 0500 01/11/19 1648 01/12/19 0631 01/13/19 0304  WBC 7.8 5.5 5.5 5.6 7.2 6.7  NEUTROABS 6.3  --  4.3  --   --   --   HGB 10.6* 10.7* 10.4* 10.8* 11.4* 10.5*  HCT 30.8* 31.6* 30.7* 31.2* 32.6* 30.8*  MCV 103.0* 102.6* 101.3* 100.0 99.4 100.0  PLT 131* 139* 143* 151 176 179   Basic Metabolic Panel: Recent Labs  Lab 01/07/19 0319 01/08/19 0404 01/09/19 0500 01/10/19 0451 01/10/19 1115 01/11/19 0500 01/12/19 0631 01/13/19 0304  NA 131* 133* 133* 132*  --  132* 131* 131*  K 3.3* 3.0* 3.2* 3.7  --  3.8 3.5 3.6  CL 99 101 99 98  --  100 95* 95*  CO2 22 22 22 24   --  23 26 27   GLUCOSE 147* 157* 167* 179*  --  185* 189* 222*  BUN 14 11 8  6*  --  11 10 8   CREATININE 0.79 0.75 0.76 0.82  --  1.12 1.21 0.89  CALCIUM 7.9* 7.9* 8.0* 8.2*  --  8.0* 8.1* 8.2*  MG 1.9 1.8  --   --  1.8  --  1.8 1.6*   GFR: Estimated Creatinine Clearance: 74.4 mL/min (by C-G formula based on SCr of 0.89 mg/dL). Liver Function Tests: Recent Labs  Lab 01/09/19 0500 01/10/19 0451 01/11/19 0500 01/12/19 0631 01/13/19 0304  AST 472* 282* 107* 55* 35  ALT 339* 321* 214* 158* 116*  ALKPHOS  322* 443* 392* 389* 323*  BILITOT 3.2* 4.0* 2.9* 2.4* 1.6*  PROT 5.2* 5.5* 5.3* 5.9* 5.6*  ALBUMIN 2.4* 2.4* 2.3* 2.6* 2.4*   Recent Labs  Lab 01/07/19 0319  LIPASE 31   No results for input(s): AMMONIA in the last 168 hours. Coagulation Profile: No results for input(s): INR, PROTIME in the last 168 hours. Cardiac Enzymes: No results for input(s): CKTOTAL, CKMB, CKMBINDEX, TROPONINI in the last 168 hours. BNP (last 3 results) No results for input(s): PROBNP in the last 8760 hours. HbA1C: No results for input(s): HGBA1C in the last 72 hours. CBG: Recent Labs  Lab 01/12/19 1220 01/12/19 1733 01/12/19 2023 01/13/19 0740 01/13/19 1207  GLUCAP 161* 184* 216* 228* 166*   Lipid Profile: No results for input(s): CHOL, HDL, LDLCALC, TRIG, CHOLHDL, LDLDIRECT in the last 72 hours. Thyroid Function  Tests: No results for input(s): TSH, T4TOTAL, FREET4, T3FREE, THYROIDAB in the last 72 hours. Anemia Panel: No results for input(s): VITAMINB12, FOLATE, FERRITIN, TIBC, IRON, RETICCTPCT in the last 72 hours. Urine analysis:    Component Value Date/Time   COLORURINE YELLOW 01/03/2019 1417   APPEARANCEUR TURBID (A) 01/03/2019 1417   LABSPEC >1.030 (H) 01/03/2019 1417   PHURINE 6.0 01/03/2019 1417   GLUCOSEU 250 (A) 01/03/2019 1417   HGBUR NEGATIVE 01/03/2019 1417   BILIRUBINUR SMALL (A) 01/03/2019 1417   KETONESUR 15 (A) 01/03/2019 1417   PROTEINUR 100 (A) 01/03/2019 1417   NITRITE NEGATIVE 01/03/2019 1417   LEUKOCYTESUR NEGATIVE 01/03/2019 1417   Sepsis Labs: @LABRCNTIP (procalcitonin:4,lacticidven:4)  ) Recent Results (from the past 240 hour(s))  Urine culture     Status: Abnormal   Collection Time: 01/03/19  2:17 PM   Specimen: Urine, Random  Result Value Ref Range Status   Specimen Description   Final    URINE, RANDOM Performed at [facility], [address] [place], [place] [address]    Special Requests   Final    NONE Performed at [facility], [address] [place], [place] [address]    Culture 20,000 COLONIES/mL ENTEROCOCCUS FAECALIS (A)  Final   Report Status 01/06/2019 FINAL  Final   Organism ID, Bacteria ENTEROCOCCUS FAECALIS (A)  Final      Susceptibility   Enterococcus faecalis - MIC*    AMPICILLIN <=2 SENSITIVE Sensitive     LEVOFLOXACIN 1 SENSITIVE Sensitive     NITROFURANTOIN 32 SENSITIVE Sensitive     VANCOMYCIN 1 SENSITIVE Sensitive     * 20,000 COLONIES/mL ENTEROCOCCUS FAECALIS  SARS CORONAVIRUS 2 (TAT 6-24 HRS) Nasopharyngeal Nasopharyngeal Swab     Status: None   Collection Time: 01/03/19  8:36 PM   Specimen: Nasopharyngeal Swab  Result Value Ref Range Status   SARS Coronavirus 2 NEGATIVE NEGATIVE Final    Comment: (NOTE) SARS-CoV-2 target nucleic acids are NOT DETECTED. The SARS-CoV-2 RNA is generally detectable in  upper and lower respiratory specimens during the acute phase of infection. Negative results do not preclude SARS-CoV-2 infection, do not rule out co-infections with other pathogens, and should not be used as the sole basis for treatment or other patient management decisions. Negative results must be combined with clinical observations, patient history, and epidemiological information. The expected result is Negative. Fact Sheet for Patients: HairSlick.no Fact Sheet for Healthcare Providers: quierodirigir.com This test is not yet approved or cleared by the [place] FDA and  has been authorized for detection and/or diagnosis of SARS-CoV-2 by FDA under an Emergency Use Authorization (EUA). This  EUA will remain  in effect (meaning this test can be used) for the duration of the COVID-19 declaration under Section 56 4(b)(1) of the Act, 21 U.S.C. section 360bbb-3(b)(1), unless the authorization is terminated or revoked sooner. Performed at [facility], [address], [place], [place] [address]   C difficile quick scan w PCR reflex     Status: None   Collection Time: 01/07/19  5:56 PM   Specimen: STOOL  Result Value Ref Range Status   C Diff antigen NEGATIVE NEGATIVE Final   C Diff toxin NEGATIVE NEGATIVE Final   C Diff interpretation No C. difficile detected.  Final    Comment: Performed at [facility], [address] [place], [place] [address]  GI pathogen panel by PCR, stool     Status: None   Collection Time: 01/07/19  5:56 PM  Result Value Ref Range Status   Plesiomonas shigelloides NOT DETECTED NOT DETECTED Final   Yersinia enterocolitica NOT DETECTED NOT DETECTED Final   Vibrio NOT DETECTED NOT DETECTED Final   Enteropathogenic E coli NOT DETECTED NOT DETECTED Final   E coli (ETEC) LT/ST NOT DETECTED NOT DETECTED Final   E coli 0157 by PCR Not applicable NOT DETECTED Final   Cryptosporidium by PCR NOT  DETECTED NOT DETECTED Final   Entamoeba histolytica NOT DETECTED NOT DETECTED Final   Adenovirus F 40/41 NOT DETECTED NOT DETECTED Final   Norovirus GI/GII NOT DETECTED NOT DETECTED Final   Sapovirus NOT DETECTED NOT DETECTED Final    Comment: (NOTE) Performed At: [facility] [address] [place], [place] [id number] Jolene Schimke MD IH:[id number]    Vibrio cholerae NOT DETECTED NOT DETECTED Final   Campylobacter by PCR NOT DETECTED NOT DETECTED Final   Salmonella by PCR NOT DETECTED NOT DETECTED Final   E coli (STEC) NOT DETECTED NOT DETECTED Final   Enteroaggregative E coli NOT DETECTED NOT DETECTED Final   Shigella by PCR NOT DETECTED NOT DETECTED Final   Cyclospora cayetanensis NOT DETECTED NOT DETECTED Final   Astrovirus NOT DETECTED NOT DETECTED Final   G lamblia by PCR NOT DETECTED NOT DETECTED Final   Rotavirus A by PCR NOT DETECTED NOT DETECTED Final  Surgical PCR screen     Status: None   Collection Time: 01/08/19  6:51 AM   Specimen: Nasal Mucosa; Nasal Swab  Result Value Ref Range Status   MRSA, PCR NEGATIVE NEGATIVE Final   Staphylococcus aureus NEGATIVE NEGATIVE Final    Comment: (NOTE) The Xpert SA Assay (FDA approved for NASAL specimens in patients 61 years of age and older), is one component of a comprehensive surveillance program. It is not intended to diagnose infection nor to guide or monitor treatment. Performed at [facility], [address], [place], [place] [address]       Radiology Studies: No results found.   Scheduled Meds: . acetaminophen  1,000 mg Oral Q8H  . apixaban  5 mg Oral BID  . arformoterol  15 mcg Nebulization BID  . dofetilide  250 mcg Oral BID  . furosemide  40 mg Intravenous BID  . insulin aspart  0-5 Units Subcutaneous QHS  . insulin aspart  0-9 Units Subcutaneous TID WC  . metoprolol succinate  50 mg Oral Daily  . pantoprazole  40 mg Oral Daily  . sodium chloride flush  10-40 mL Intracatheter Q12H  .  umeclidinium bromide  1 puff Inhalation Daily   Continuous Infusions: . lactated ringers 10 mL/hr  at 01/08/19 0925     LOS: 10 days   Time Spent in minutes  45 minutes  [name] D.O. on 01/13/2019 at 12:38 PM  Between 7am to 7pm - Please see pager noted on [email or web address]  After 7pm go to [email or web address]  And look for the night coverage person covering for me after hours  [facility] Office  [phone]

## 2019-01-14 ENCOUNTER — Telehealth: Payer: Self-pay | Admitting: Physician Assistant

## 2019-01-14 ENCOUNTER — Other Ambulatory Visit: Payer: Self-pay

## 2019-01-14 DIAGNOSIS — E871 Hypo-osmolality and hyponatremia: Secondary | ICD-10-CM

## 2019-01-14 DIAGNOSIS — R7989 Other specified abnormal findings of blood chemistry: Secondary | ICD-10-CM

## 2019-01-14 DIAGNOSIS — K859 Acute pancreatitis without necrosis or infection, unspecified: Secondary | ICD-10-CM

## 2019-01-14 DIAGNOSIS — E118 Type 2 diabetes mellitus with unspecified complications: Secondary | ICD-10-CM

## 2019-01-14 LAB — COMPREHENSIVE METABOLIC PANEL
ALT: 90 U/L — ABNORMAL HIGH (ref 0–44)
AST: 30 U/L (ref 15–41)
Albumin: 2.6 g/dL — ABNORMAL LOW (ref 3.5–5.0)
Alkaline Phosphatase: 284 U/L — ABNORMAL HIGH (ref 38–126)
Anion gap: 10 (ref 5–15)
BUN: 7 mg/dL — ABNORMAL LOW (ref 8–23)
CO2: 26 mmol/L (ref 22–32)
Calcium: 8.2 mg/dL — ABNORMAL LOW (ref 8.9–10.3)
Chloride: 95 mmol/L — ABNORMAL LOW (ref 98–111)
Creatinine, Ser: 0.86 mg/dL (ref 0.61–1.24)
GFR calc Af Amer: >60 mL/min (ref >60)
GFR calc non Af Amer: >60 mL/min (ref >60)
Glucose, Bld: 195 mg/dL — ABNORMAL HIGH (ref 70–99)
Potassium: 4.2 mmol/L (ref 3.5–5.1)
Sodium: 131 mmol/L — ABNORMAL LOW (ref 135–145)
Total Bilirubin: 1.4 mg/dL — ABNORMAL HIGH (ref 0.3–1.2)
Total Protein: 5.6 g/dL — ABNORMAL LOW (ref 6.5–8.1)

## 2019-01-14 LAB — MAGNESIUM: Magnesium: 1.9 mg/dL (ref 1.7–2.4)

## 2019-01-14 LAB — GLUCOSE, CAPILLARY
Glucose-Capillary: 231 mg/dL — ABNORMAL HIGH (ref 70–99)
Glucose-Capillary: 210 mg/dL — ABNORMAL HIGH (ref 70–99)

## 2019-01-14 LAB — CBC
HCT: 32.1 % — ABNORMAL LOW (ref 39.0–52.0)
Hemoglobin: 10.8 g/dL — ABNORMAL LOW (ref 13.0–17.0)
MCH: 34 pg (ref 26.0–34.0)
MCHC: 33.6 g/dL (ref 30.0–36.0)
MCV: 100.9 fL — ABNORMAL HIGH (ref 80.0–100.0)
Platelets: 204 10*3/uL (ref 150–400)
RBC: 3.18 MIL/uL — ABNORMAL LOW (ref 4.22–5.81)
RDW: 14.6 % (ref 11.5–15.5)
WBC: 7.8 10*3/uL (ref 4.0–10.5)
nRBC: 0 % (ref 0.0–0.2)

## 2019-01-14 LAB — SURGICAL PATHOLOGY

## 2019-01-14 MED ORDER — ATORVASTATIN CALCIUM 80 MG PO TABS: 80.0000 mg | ORAL_TABLET | Freq: Every day | ORAL | Status: DC

## 2019-01-14 MED ORDER — DOFETILIDE 250 MCG PO CAPS: 250.0000 ug | ORAL_CAPSULE | Freq: Two times a day (BID) | ORAL | 0 refills | Status: DC

## 2019-01-14 MED ORDER — FUROSEMIDE 20 MG PO TABS: 20.0000 mg | ORAL_TABLET | Freq: Two times a day (BID) | ORAL | 0 refills | Status: DC

## 2019-01-14 MED ORDER — MAGNESIUM SULFATE 2 GM/50ML IV SOLN
2.0000 g | Freq: Once | INTRAVENOUS | Status: AC
  Administered 2019-01-14: 2 g via INTRAVENOUS
  Filled 2019-01-14: qty 50

## 2019-01-14 NOTE — Progress Notes (Addendum)
Progress Note  Patient Name: [name] Date of Encounter: 01/14/2019  Primary Cardiologist: Sherren Mocha, MD   Subjective   Patient is feeling good today. Has been diuresing well.  No CP or SOB. AM EKG pending  Inpatient Medications    Scheduled Meds: . acetaminophen  1,000 mg Oral Q8H  . apixaban  5 mg Oral BID  . arformoterol  15 mcg Nebulization BID  . dofetilide  250 mcg Oral BID  . furosemide  40 mg Intravenous BID  . insulin aspart  0-5 Units Subcutaneous QHS  . insulin aspart  0-9 Units Subcutaneous TID WC  . metoprolol succinate  50 mg Oral Daily  . pantoprazole  40 mg Oral Daily  . potassium chloride  40 mEq Oral TID  . sodium chloride flush  10-40 mL Intracatheter Q12H  . umeclidinium bromide  1 puff Inhalation Daily   Continuous Infusions: . lactated ringers 10 mL/hr at 01/08/19 0925   PRN Meds: alum & mag hydroxide-simeth, ipratropium-albuterol, metoCLOPramide (REGLAN) injection, morphine injection, morphine injection, oxyCODONE, sodium chloride flush   Vital Signs    Vitals:   01/13/19 0900 01/13/19 1429 01/13/19 2141 01/14/19 0428  BP:  112/75 (!) 115/91 127/78  Pulse:  82 77 71  Resp:  18 16 16   Temp:  98.1 F (36.7 C) 98 F (36.7 C) 97.8 F (36.6 C)  TempSrc:  Oral Axillary Oral  SpO2:  99% 94% 96%  Weight: 97 kg     Height:        Intake/Output Summary (Last 24 hours) at 01/14/2019 0806 Last data filed at 01/14/2019 0435 Gross per 24 hour  Intake 1300 ml  Output 4200 ml  Net -2900 ml   Last 3 Weights 01/13/2019 01/13/2019 01/12/2019  Weight (lbs) 213 lb 13.5 oz 216 lb 14.9 oz 220 lb 7.4 oz  Weight (kg) 97 kg 98.4 kg 100 kg      Telemetry    V-paced rhythym, HR 80-90's intermittent Afib/flutter - Personally Reviewed  ECG    Pending - Personally Reviewed  Physical Exam   GEN: No acute distress.   Neck: No JVD Cardiac: RRR, no murmurs, rubs, or gallops.  Respiratory: Clear to auscultation bilaterally. GI: Soft,  nontender, non-distended  MS: 1+ edema; No deformity. Neuro:  Nonfocal  Psych: Normal affect   Labs    High Sensitivity Troponin:   Recent Labs  Lab 01/03/19 1548 01/03/19 1755 01/04/19 0500 01/04/19 0844 01/06/19 1209  TROPONINIHS 10 11 20* 15 24*      Chemistry Recent Labs  Lab 01/12/19 0631 01/13/19 0304 01/14/19 0423  NA 131* 131* 131*  K 3.5 3.6 4.2  CL 95* 95* 95*  CO2 26 27 26   GLUCOSE 189* 222* 195*  BUN 10 8 7*  CREATININE 1.21 0.89 0.86  CALCIUM 8.1* 8.2* 8.2*  PROT 5.9* 5.6* 5.6*  ALBUMIN 2.6* 2.4* 2.6*  AST 55* 35 30  ALT 158* 116* 90*  ALKPHOS 389* 323* 284*  BILITOT 2.4* 1.6* 1.4*  GFRNONAA 54* >60 >60  GFRAA >60 >60 >60  ANIONGAP 10 9 10      Hematology Recent Labs  Lab 01/12/19 0631 01/13/19 0304 01/14/19 0423  WBC 7.2 6.7 7.8  RBC 3.28* 3.08* 3.18*  HGB 11.4* 10.5* 10.8*  HCT 32.6* 30.8* 32.1*  MCV 99.4 100.0 100.9*  MCH 34.8* 34.1* 34.0  MCHC 35.0 34.1 33.6  RDW 13.8 14.3 14.6  PLT 176 179 204    BNPNo results for input(s): BNP, PROBNP  in the last 168 hours.   DDimer No results for input(s): DDIMER in the last 168 hours.   Radiology    No results found.  Cardiac Studies    Echo 11/05/18 1. Left ventricular ejection fraction, by visual estimation, is 35 to 40%. The left ventricle has moderately decreased function. There is global left ventricular hypokinesis and marked apical-basal and septal-lateral dyssynchrony. There appears to be  apical akinesis. 2. Abnormal septal motion consistent with RV pacemaker. 3. Global right ventricle has moderately reduced systolic function.The right ventricular size is normal. No increase in right ventricular wall thickness. 4. Left atrial size was severely dilated. 5. Right atrial size was moderately dilated. 6. Mild mitral valve regurgitation. Mild-moderate mitral stenosis. Peak and mean gradients are 16 and 5 mm Hg, respectively, at 70 bpm. Estimated valve area is 1.4 cm sq by the  PHT method. 7. Moderate calcification of the mitral valve leaflet(s). 8. Severe mitral annular calcification. 9. Severe thickening of the mitral valve leaflet(s). 10. The tricuspid valve is normal in structure. Tricuspid valve regurgitation is mild-moderate. 11. The aortic valve is normal in structure. Aortic valve regurgitation was not visualized by color flow Doppler. 12. The pulmonic valve was normal in structure. Pulmonic valve regurgitation is mild by color flow Doppler. 13. Mildly elevated pulmonary artery systolic pressure. 14. A pacer wire is visualized. 15. The inferior vena cava is dilated in size with >50% respiratory variability, suggesting right atrial pressure of 8 mmHg. 16. Left ventricular diastolic function could not be evaluated due to atrial fibrillation and mitral stenosis. 70. Compared to February 2020, mitral valve gradients are worse, probably due to new atrial fibrillation and increased ventricular rate.  Cardiac cath 11/05/2016 1. Severe 2 vessel CAD with continued patency of the LIMA-LAD and right radial graft-OM 2. Moderate, severely calcified, proximal circumflex stenosis with negative FFR assessment 3. Normal right heart hemodynamics except for a prominent V wave in the PCWP tracing 4. Normal/low LVEDP  Recommend: ongoing medical therapy. Pt appears to be well-compensated from a hemodynamic perspective.  Patient Profile     85 y.o. male male with a hx of CAD s/p CABG, mitral valve repair, polymorphic VT s/p ICD [employer] dual chamber, PFO closure, Persistent afib on Eliquis, Chronic systolic and diastolic HF (EF [id number]), HTN, DM, SIADH, carotid artery disease s/p R CEA 10/2018 and HLDwho is being seen today for the evaluation ofpre-operative cardiac evaluation for acute pancreatitis.  Assessment & Plan    Paroxysmal Afib - Patient on [name]. Dose was reduced due to prolonged QT to 590. QtC improved. To 482. AM EKG pending - Potassium supplemented  yesterday, 4.2 today - Eliquis restarted per surgery recommendations - Tele showed V-paced rhythm with intermittent Afib/flutter. -  MD to see  AICD - Hx of polymorphic VT - Keep K+ > 4 and Mg > 2  Infrarenal abdominal aortic aneurysm 5.7 mm with no dissection - Vascular plan for surgery after acute issue has resolved  Acute on Chronic systolic HF - EF [id number] with ICD - Continue BB - Has been on Lasix IV 40 mg BID - Urine output in 24 hours 4.2 L - Weight still up from admission but down 3 lbs in the last 24 hours - creatinine is stable - Patient still has fluid on him. Continue with diuresis  For questions or updates, please contact [place] Please consult [email or web address] for contact info under        Signed, Cadence Ninfa Meeker, PA-C  01/14/2019, 8:06 AM    ---------------------------------------------------------------------------------------------  History and all data above reviewed.  Patient examined.  I agree with the findings as above.  [name] feels well overall, planning for discharge today.  Constitutional: No acute distress Cardiovascular: regular rhythm, normal rate, no murmurs. S1 and S2 normal. Radial pulses normal bilaterally. No jugular venous distention.  Respiratory: clear to auscultation bilaterally GI : soft, bs+  MSK: extremities warm, well perfused. 1+edema.  NEURO: grossly nonfocal exam, moves all extremities. PSYCH: alert and oriented x 3, normal mood and affect.   All available labs, radiology testing, previous records reviewed. Agree with documented assessment and plan of my colleague as stated above with the following additions or changes:  Principal Problem:   Acute pancreatitis Active Problems:   ATRIAL FIBRILLATION   Automatic implantable cardioverter-defibrillator in situ   Coronary artery disease with exertional angina (HCC)   Type 2 diabetes mellitus with complication, without long-term current use of insulin (HCC)    Hyponatremia   LFT elevation   Gallstones   Abdominal aortic aneurysm (AAA) without rupture (HCC)   QT prolongation   Acute systolic heart failure (HCC)    Paroxysmal atrial fibrillation -Pt continues on [name], has rash with amiodarone recently. Continues on Toprol XL.  -[name] dose was reduced to 250 mcg due to prolonged QTC up to 590. QTC improved on lower dose to 482. QTC stable on today's ecg -  K+ has been as low as 3, Repletion recommended as needed - Keep K+ above 4 and Mg above 2.  - Appears to occasionally have a flutter wave on telemetry. - on Eliquis  AICD in place -Hx of polymorphic VT.  -As above, keep K+>4 and Mg >2.   Infrarenal abdominal aortic aneurysm 5.7 mm with no dissection -Vascular plan for surgery after this current issue resolved.   Acute on chronic systolic heart failure -LVEF 35-40% in 10/2018. Has ICD in place -On BB at home..  -Volume overloaded due to IVF's - will plan for dismissal on po lasix, discussed with hospitalist team -wrap legs   Answered all patient questions to the best of my ability.   Time Spent Directly with Patient:  I have spent a total of 35 minutes with the patient reviewing hospital notes, telemetry, EKGs, labs and examining the patient as well as establishing an assessment and plan that was discussed personally with the patient.  > 50% of time was spent in direct patient care.  Length of Stay:  LOS: 11 days   Elouise Munroe, MD [facility] 12:33 PM  01/14/2019

## 2019-01-14 NOTE — Plan of Care (Signed)
Nutrition Education Note  RD consulted for nutrition education regarding CHF and diabetes.  Lab Results  Component Value Date   HGBA1C 6.8 (H) 11/04/2018    Case discussed with RN prior to visit. Pt with multiple questions related to diet at discharge.   Spoke with pt at bedside, who reports that he has a good appetite. However, he has been researching diet on the internet and reports "it seems like I can't eat anything". Pt reports he consumes 3 meals per day (Breakfast: cereal, egg, and juice; Lunch: sandwich and soup; Dinner: meat, starch, and vegetable). Pt also reports consuming a glass of wine or a serving of scotch at dinner.   Pt monitors wt and reports UBW of 178#, which has been consistent.   RD provided "Heart Healthy, Consistent Carbohydrate Nutrition Therapy" handout from the Academy of [employer]etics. Reviewed patient's dietary recall. Provided examples on ways to decrease sodium intake in diet. Discouraged intake of processed foods and use of salt shaker. Encouraged fresh fruits and vegetables as well as whole grain sources of carbohydrates to maximize fiber intake.   RD discussed why it is important for patient to adhere to diet recommendations, and emphasized the role of fluids, foods to avoid, and importance of weighing self daily.   Discussed different food groups and their effects on blood sugar, emphasizing carbohydrate-containing foods. Provided list of carbohydrates and recommended serving sizes of common foods.  Discussed importance of controlled and consistent carbohydrate intake throughout the day. Provided examples of ways to balance meals/snacks and encouraged intake of high-fiber, whole grain complex carbohydrates. Teach back method used.  Expect good compliance.  Body mass index is 27.22 kg/m. Pt meets criteria for normal weight range based on current BMI.  Current diet order is heart healthy/ carb modified, patient is consuming approximately 100% of  meals at this time. Labs and medications reviewed. No further nutrition interventions warranted at this time. RD contact information provided. If additional nutrition issues arise, please re-consult RD.   Kadajah Kjos A. Jimmye Norman, RD, LDN, [place] Registered Dietitian II Certified Diabetes Care and Education Specialist Pager: [phone] After hours Pager: [phone]

## 2019-01-14 NOTE — Progress Notes (Signed)
Inpatient Diabetes Program Recommendations  AACE/ADA: New Consensus Statement on Inpatient Glycemic Control (2015)  Target Ranges:  Prepandial:   less than 140 mg/dL      Peak postprandial:   less than 180 mg/dL (1-2 hours)      Critically ill patients:  140 - 180 mg/dL   Lab Results  Component Value Date   GLUCAP 231 (H) 01/14/2019   HGBA1C 6.8 (H) 11/04/2018    Review of Glycemic Control Results for [name], [name]" (MRN [id number]) as of 01/14/2019 10:32  Ref. Range 01/13/2019 12:07 01/13/2019 17:11 01/13/2019 21:37 01/14/2019 08:03  Glucose-Capillary Latest Ref Range: 70 - 99 mg/dL 166 (H) 272 (H) 188 (H) 231 (H)   Diabetes history: DM 2 Outpatient Diabetes medications: Starlix 120 mg bid, Actos 45 mg daily, Januvia 100 mg daily Current orders for Inpatient glycemic control:  Novolog sensitive tid with meals and HS Inpatient Diabetes Program Recommendations:   If appropriate, please consider adding Lantus 10 units daily while in the hospital and oral agents on hold.    Thanks,  Adah Perl, RN, BC-ADM Inpatient Diabetes Coordinator Pager [phone] (8a-5p)

## 2019-01-14 NOTE — Progress Notes (Signed)
Patient discharged to home with instructions. 

## 2019-01-14 NOTE — Telephone Encounter (Signed)
    TOC appt with Meng (no open slot at [facility])

## 2019-01-14 NOTE — Discharge Instructions (Signed)
Information on my medicine - ELIQUIS (apixaban)  This medication education was reviewed with me or my healthcare representative as part of my discharge preparation.  You were taking this medication prior to this hospital admission.  Why was Eliquis prescribed for you? Eliquis was prescribed for you to reduce the risk of a blood clot forming that can cause a stroke if you have a medical condition called atrial fibrillation (a type of irregular heartbeat).  What do You need to know about Eliquis ? Take your Eliquis TWICE DAILY - one tablet in the morning and one tablet in the evening with or without food. If you have difficulty swallowing the tablet whole please discuss with your pharmacist how to take the medication safely.  Take Eliquis exactly as prescribed by your doctor and DO NOT stop taking Eliquis without talking to the doctor who prescribed the medication.  Stopping may increase your risk of developing a stroke.  Refill your prescription before you run out.  After discharge, you should have regular check-up appointments with your healthcare provider that is prescribing your Eliquis.  In the future your dose may need to be changed if your kidney function or weight changes by a significant amount or as you get older.  What do you do if you miss a dose? If you miss a dose, take it as soon as you remember on the same day and resume taking twice daily.  Do not take more than one dose of ELIQUIS at the same time to make up a missed dose.  Important Safety Information A possible side effect of Eliquis is bleeding. You should call your healthcare provider right away if you experience any of the following: ? Bleeding from an injury or your nose that does not stop. ? Unusual colored urine (red or dark brown) or unusual colored stools (red or black). ? Unusual bruising for unknown reasons. ? A serious fall or if you hit your head (even if there is no bleeding).  Some medicines may  interact with Eliquis and might increase your risk of bleeding or clotting while on Eliquis. To help avoid this, consult your healthcare provider or pharmacist prior to using any new prescription or non-prescription medications, including herbals, vitamins, non-steroidal anti-inflammatory drugs (NSAIDs) and supplements.  This website has more information on Eliquis (apixaban): http://www.eliquis.com/eliquis/home  [facility]: POST OP INSTRUCTIONS Always review your discharge instruction sheet given to you by the facility where your surgery was performed. IF YOU HAVE DISABILITY OR FAMILY LEAVE FORMS, YOU MUST BRING THEM TO THE OFFICE FOR PROCESSING.   DO NOT GIVE THEM TO YOUR DOCTOR.  1. A prescription for pain medication may be given to you upon discharge.  Take your pain medication as prescribed, if needed.  If narcotic pain medicine is not needed, then you may take acetaminophen (Tylenol) or ibuprofen (Advil) as needed. 2. Take your usually prescribed medications unless otherwise directed. 3. If you need a refill on your pain medication, please contact your pharmacy.  They will contact our office to request authorization. Prescriptions will not be filled after 5pm or on week-ends. 4. You should follow a light diet the first few days after arrival home, such as soup and crackers, etc.  Be sure to include lots of fluids daily. 5. Most patients will experience some swelling and bruising in the area of the incisions.  Ice packs will help.  Swelling and bruising can take several days to resolve.  6. It is common  to experience some constipation if taking pain medication after surgery.  Increasing fluid intake and taking a stool softener (such as Colace) will usually help or prevent this problem from occurring.  A mild laxative (Milk of Magnesia or Miralax) should be taken according to package instructions if there are no bowel movements after 48  hours. 7. Unless discharge instructions indicate otherwise, you may remove your bandages 24-48 hours after surgery, and you may shower at that time.  You may have steri-strips (small skin tapes) in place directly over the incision.  These strips should be left on the skin for 7-10 days.  If your surgeon used skin glue on the incision, you may shower in 24 hours.  The glue will flake off over the next 2-3 weeks.  Any sutures or staples will be removed at the office during your follow-up visit. 8. ACTIVITIES:  You may resume regular (light) daily activities beginning the next day--such as daily self-care, walking, climbing stairs--gradually increasing activities as tolerated.  You may have sexual intercourse when it is comfortable.  Refrain from any heavy lifting or straining until approved by your doctor. a. You may drive when you are no longer taking prescription pain medication, you can comfortably wear a seatbelt, and you can safely maneuver your car and apply brakes. b. RETURN TO WORK:  __________________________________________________________ 9. You should see your doctor in the office for a follow-up appointment approximately 2-3 weeks after your surgery.  Make sure that you call for this appointment within a day or two after you arrive home to insure a convenient appointment time. 10. OTHER INSTRUCTIONS: __________________________________________________________________________________________________________________________ __________________________________________________________________________________________________________________________ WHEN TO CALL YOUR DOCTOR: 1. Fever over 101.0 2. Inability to urinate 3. Continued bleeding from incision. 4. Increased pain, redness, or drainage from the incision. 5. Increasing abdominal pain  The clinic staff is available to answer your questions during regular business hours.  Please don't hesitate to call and ask to speak to one of the nurses for  clinical concerns.  If you have a medical emergency, go to the nearest emergency room or call 911.  A surgeon from [facility] is always on call at the hospital. [address] [place], [place], [place]  [address] ? [address], [place], [place]   [phone] ? [phone] ? FAX [phone] Web site: [email or web address]

## 2019-01-14 NOTE — Plan of Care (Signed)
  Problem: Clinical Measurements: Goal: Will remain free from infection Outcome: Progressing   Problem: Activity: Goal: Risk for activity intolerance will decrease Outcome: Progressing   Problem: Coping: Goal: Level of anxiety will decrease Outcome: Progressing   Problem: Elimination: Goal: Will not experience complications related to bowel motility Outcome: Progressing   Problem: Elimination: Goal: Will not experience complications related to urinary retention Outcome: Progressing   Problem: Pain Managment: Goal: General experience of comfort will improve Outcome: Progressing

## 2019-01-14 NOTE — Discharge Summary (Signed)
Physician Discharge Summary  [name] ION:[id number] DOB: October 20, 1931 DOA: 01/03/2019  PCP: Joycelyn Rua, MD  Admit date: 01/03/2019 Discharge date: 01/14/2019  Time spent: 45 minutes  Recommendations for Outpatient Follow-up:  Patient will be discharged to home with home health physical and occupational therapy.  Patient will need to follow up with primary care provider within one week of discharge, repeat CMP on Friday 01/17/2019. Follow up with general surgery, cardiology, gastroneterology.  Patient should continue medications as prescribed.  Patient should follow a heart healthy/ carb modified diet.  Hold atorvastatin until liver function has normalized.  Discharge Diagnoses:  Acute biliary pancreatitis/choledocholithiasis with elevated LFTs Infrarenal abdominal aortic aneurysm Paroxysmal atrial fibrillation Hyponatremia Hypokalemia Acute on chronic systolic heart failure Diabetes mellitus, type II Microcytic anemia Generalized deconditioning  Discharge Condition: Stable  Diet recommendation: heart healthy/carb modified  Filed Weights   01/13/19 0745 01/13/19 0900 01/14/19 0800  Weight: 98.4 kg 97 kg 93.6 kg    History of present illness:  On 01/04/2019 by Dr. Gust Brooms Schurris a 85 y.o.malewithhistory of CAD status post CABG, chronic systolic heart failure status post AICD placement, A. fib on apixaban and [name] had a reaction to amiodarone recently, recent carotid endarterectomy admitted recently for CHF in October 2 months ago presents to the ER with complaint of severe abdominal pain over the last 2 days with no nausea vomiting or diarrhea. Abdominal pain is mostly in the lower part of the abdomen. Likely. Denies any fever chills or chest pain or shortness of breath. Abdominal pain is dull severe pain not associated with food.  Hospital Course:  Acute biliary pancreatitis/choledocholithiasis with elevated LFTs -Patient will call us  lithiasis with dilated CBD -CT scan on admission showed features concerning for acute pancreatitis with dilated CBD and gallstones with ductal enhancement concerning for infection -Initially managed by supportive care, bowel rest and IV fluids -[facility]ology and general surgery consulted -Patient was placed on Zosyn -Repeat CT abdomen on 01/06/2019 showed acute pancreatitis without necrosis, distended gallbladder with stones and CBD dilatation up to 30 mm with question of appendicitis as well -General surgery performed lap cholecystectomy with IOC.  Patient noted to have questionable filling defect in the distal CBD -MRCP could not be done as patient has pacemaker -Status post ERCP today, showing choledocholithiasis.  Complete removal was accomplished by biliary sphincterotomy and balloon extraction.  GI recommended clear liquid diet for 6 hours and may advance as tolerated thereafter.  Recommended restarting heparin on 12/12. Will restart Eliquis.  -LFTs trending downward -Tolerated soft diet, advancing to heart healthy/carb mod  Infrarenal abdominal aortic aneurysm -Measuring 5.7 cm no dissection -Patient will need elective vascular surgical intervention once gallstone pancreatitis issues have resolved -Vascular surgery was consulted and appreciated  Paroxysmal atrial fibrillation -Currently rate controlled -Continue Eliquis -Continue [name] and metoprolol -magnesium 1.9- will replace, goal of 2; potassium goal of 4 -Cardiology consulted and appreciated  Hyponatremia -Chronic. Patient with recent diagnosis of SIADH -Sodium currently 131- stable  Hypokalemia -Resolved with replacement, currently 4.2 -Repeat BMP in one week  Acute on chronic systolic heart failure -With history of AICD -Echocardiogram October 2020 showed an EF of 35 to 40% -Patient with complaints of shortness of breath.  On examination does have upper and lower extremity edema.  Patient did receive  volume resuscitation during surgery as well as ERCP -IV fluids have been discontinued -was placed on IV Lasix 40 mg twice daily  -Monitor intake and output, daily weights -weight down 6.4kg, urine  output over the past 24hrs 4.4L -Cardiology consulted and appreciated -will discharge patient with lasix 40mg  PO BID -patient to follow up with cardiology and have repeat BMP and magnesium  Diabetes mellitus, type II -Continue insulin sliding scale and CBG monitoring  Microcytic anemia -Vitamin B12, folate, TSH are all unremarkable and within normal limits -hemoglobin stable, currently 10.8  Generalized deconditioning -PT recommending home health  Consultants Gastroenterology General surgery Cardiology   Procedures  lap cholecystectomy with IOC.  Patient noted to have questionable filling defect in the distal CBD  ERCP  Discharge Exam: Vitals:   01/14/19 0428 01/14/19 1532  BP: 127/78 114/73  Pulse: 71 77  Resp: 16 17  Temp: 97.8 F (36.6 C) 97.8 F (36.6 C)  SpO2: 96% 98%     General: Well developed, well nourished, NAD, appears stated age  HEENT: NCAT, mucous membranes moist.  Cardiovascular: S1 S2 auscultated, irregular  Respiratory: Clear to auscultation bilaterally with equal chest rise  Abdomen: Soft, nontender, nondistended, + bowel sounds, dressing in place  Extremities: warm dry without cyanosis clubbing. 2+LE edema.  Upper extremity edema improved  Neuro: AAOx3, nonfocal  Psych: Pleasant, appropriate mood and affect  Discharge Instructions Discharge Instructions    Discharge instructions   Complete by: As directed    Patient will be discharged to home with home health physical and occupational therapy.  Patient will need to follow up with primary care provider within one week of discharge, repeat CMP on Friday 01/17/2019. Follow up with general surgery, cardiology, gastroneterology.  Patient should continue medications as prescribed.  Patient  should follow a heart healthy/ carb modified diet.  Hold atorvastatin until liver function has normalized.     Allergies as of 01/14/2019      Reactions   Bee Venom Anaphylaxis   Latex Other (See Comments)   REDNESS AT REACTION SITE.   Metformin Diarrhea   Rivaroxaban Other (See Comments)   Headaches, blurred vision   Sotalol Other (See Comments)   "BLUE TOES"- cut his circulation off   Warfarin Sodium Other (See Comments)   REACTION: migraine headaches/vision impariment      Medication List    STOP taking these medications   amiodarone 200 MG tablet Commonly known as: PACERONE     TAKE these medications   acetaminophen 500 MG tablet Commonly known as: TYLENOL Take 2 tablets (1,000 mg total) by mouth every 8 (eight) hours as needed for mild pain, moderate pain or headache.   apixaban 5 MG Tabs tablet Commonly known as: Eliquis Take 1 tablet (5 mg total) by mouth 2 (two) times daily.   atorvastatin 80 MG tablet Commonly known as: LIPITOR Take 1 tablet (80 mg total) by mouth at bedtime. Hold until liver function has normalized. What changed: additional instructions   dofetilide 250 MCG capsule Commonly known as: [name] Take 1 capsule (250 mcg total) by mouth 2 (two) times daily. What changed:   medication strength  how much to take  when to take this   EpiPen 2-Pak 0.3 mg/0.3 mL Soaj injection Generic drug: EPINEPHrine Inject 0.3 mg into the muscle daily as needed for anaphylaxis. USE ONLY IN EMERGENCY   fluticasone 50 MCG/ACT nasal spray Commonly known as: FLONASE Place 1 spray into the nose daily as needed for rhinitis or allergies.   furosemide 20 MG tablet Commonly known as: LASIX Take 1 tablet (20 mg total) by mouth 2 (two) times daily for 14 days. What changed:   when to take this  reasons  to take this   ibuprofen 200 MG tablet Commonly known as: ADVIL Take 400 mg by mouth every 6 (six) hours as needed for headache.   Magnesium 250 MG  Tabs Take 250 mg by mouth daily.   metoprolol succinate 50 MG 24 hr tablet Commonly known as: TOPROL-XL Take 1 tablet (50 mg total) by mouth 2 (two) times daily. Take with or immediately following a meal. What changed: when to take this   multivitamin with minerals Tabs tablet Take 1 tablet by mouth daily. Centrum Silver   nateglinide 120 MG tablet Commonly known as: STARLIX Take 120 mg by mouth Twice daily.   pioglitazone 45 MG tablet Commonly known as: ACTOS Take 45 mg by mouth daily.   sitaGLIPtin 100 MG tablet Commonly known as: JANUVIA Take 100 mg by mouth at bedtime.   Tiotropium Bromide-Olodaterol 2.5-2.5 MCG/ACT Aers Commonly known as: Stiolto Respimat Inhale 2 puffs into the lungs daily.   triamcinolone cream 0.1 % Commonly known as: KENALOG Apply 1 application topically 2 (two) times daily as needed (rash).   Vitamin D-3 125 MCG (5000 UT) Tabs Take 5,000-10,000 Units by mouth See admin instructions. Take 5000 units by mouth on Sunday, Tuesday, Thursday, and Saturday. Take 40981 units by mouth on Monday, Wednesday, and Friday      Allergies  Allergen Reactions  . Bee Venom Anaphylaxis  . Latex Other (See Comments)    REDNESS AT REACTION SITE.  Marland Kitchen Metformin Diarrhea  . Rivaroxaban Other (See Comments)    Headaches, blurred vision  . Sotalol Other (See Comments)    "BLUE TOES"- cut his circulation off  . Warfarin Sodium Other (See Comments)    REACTION: migraine headaches/vision impariment   Follow-up Information    [facility] Follow up on 02/04/2019.   Specialty: General Surgery Why: Follow up appointment scheduled for 8:45 AM. A provider will call you during scheduled appointment time. Please send a photo of incisions,license, and insurance card to [email or web address] the day prior to appointment.   Contact information: [address] [place] [address] [phone]        [facility], [facility] Follow up.    Specialty: Home Health Services Why: Home health PT  Contact information: [address] [place] [address] [phone]        [facility]ale[facility] Course, [place] Follow up on 01/20/2019.   Specialties: Cardiology, Radiology Why: 3:00PM. Note followup in is [facility] office as [address] office schedule is full. Make sure you arrive in the right location Contact information: [address] [place] [address] [phone]        Joycelyn Rua, MD. Schedule an appointment as soon as possible for a visit in 1 week(s).   Specialty: Family Medicine Why: Hospital follow up, repeat CMP and magnesium on 01/17/2019 Contact information: [address] [place] [address] [phone]        [name], MD .   Specialty: Cardiology Contact information: [address] [place] [address] [phone]        Duke Salvia, MD .   Specialty: Cardiology Contact information: [phone] [address] [place] [address] [phone]            The results of significant diagnostics from this hospitalization (including imaging, microbiology, ancillary and laboratory) are listed below for reference.    Significant Diagnostic Studies: DG Cholangiogram Operative  Result Date: 01/08/2019 CLINICAL DATA:  Intraoperative cholangiogram during laparoscopic  cholecystectomy. EXAM: INTRAOPERATIVE CHOLANGIOGRAM FLUOROSCOPY TIME:  22 seconds COMPARISON:  CT abdomen pelvis - 01/06/2019 FINDINGS: Intraoperative cholangiographic images of the right upper abdominal quadrant during laparoscopic cholecystectomy are provided for review. Surgical clips overlie the expected location of the gallbladder fossa. Contrast injection demonstrates selective cannulation of the central aspect of the cystic duct. There is passage of contrast through the central aspect of the cystic duct with filling of a moderately dilated common bile duct. There is passage of  contrast though the CBD and into the descending portion of the duodenum. There is minimal reflux of injected contrast into the common hepatic duct and central aspect of the non dilated intrahepatic biliary system. There is a persistent nonocclusive filling defect in the distal aspect of the CBD worrisome for nonocclusive choledocholithiasis. IMPRESSION: Suspected nonocclusive choledocholithiasis within the distal aspect of the CBD. Further evaluation and management with ERCP could be performed as indicated. Electronically Signed   By: Simonne Come M.D.   On: 01/08/2019 12:33   CT ABDOMEN W CONTRAST  Result Date: 01/06/2019 CLINICAL DATA:  Pancreatitis. EXAM: CT ABDOMEN WITH CONTRAST TECHNIQUE: Multidetector CT imaging of the abdomen was performed using the standard protocol following bolus administration of intravenous contrast. CONTRAST:  OMNIPAQUE IOHEXOL 300 MG/ML  SOLN COMPARISON:  CTA 01/03/2019 FINDINGS: Lower chest: The heart is enlarged. No substantial pericardial effusion. Small bilateral pleural effusions evident. Hepatobiliary: No suspicious focal abnormality within the liver parenchyma. Gallbladder is distended with tiny layering gallstones evident, measuring up to 4 mm diameter. Mild intrahepatic biliary duct dilatation associated with extrahepatic common duct measuring 13 mm diameter. Common bile duct just proximal to the ampulla is 12 mm diameter. No calcified stone discernible within the extrahepatic biliary system. Pancreas: Pancreatic parenchyma is ill-defined with peripancreatic edema noted diffusely but most prominent in the region of the pancreatic head. No dilatation of the main pancreatic duct. Pancreatic parenchyma enhances throughout. Spleen: No splenomegaly. No focal mass lesion. Adrenals/Urinary Tract: No adrenal nodule or mass. 3.1 cm exophytic cyst noted interpolar left kidney. Right kidney unremarkable. No evidence for hydroureter. The urinary bladder appears normal for the  degree of distention. Stomach/Bowel: Stomach is unremarkable. No gastric wall thickening. No evidence of outlet obstruction. Duodenum is normally positioned as is the ligament of Treitz. No small bowel wall thickening. No small bowel dilatation. The terminal ileum is normal. The appendix measures up to 13 mm diameter with a fluid-filled lumen. CT scan yesterday showed gas diffusely in the lumen of the appendix with appendiceal diameter maximal at 8 mm. There is fluid/edema around the gallbladder on today's study, progressive in the interval. No gross colonic mass. No colonic wall thickening. Diverticular changes are noted in the left colon without evidence of diverticulitis. Vascular/Lymphatic: Abdominal aortic aneurysm is stable in the interval measuring 5.7 cm diameter today. Insert calcis crash that There is no gastrohepatic or hepatoduodenal ligament lymphadenopathy. No intraperitoneal or retroperitoneal lymphadenopathy. No pelvic sidewall lymphadenopathy. Other: Small volume free fluid noted adjacent to the liver and spleen. There is fluid in the para colic gutters bilaterally. Retroperitoneal edema/inflammation is seen in the anterior pararenal space and tracking in the extraperitoneal tissues towards the pelvis, right greater than left. Musculoskeletal: Small bilateral groin hernias contain only fat. Posttraumatic deformity noted right iliac bone. Compression deformity in the in the L4 vertebral body is stable. IMPRESSION: 1. Peripancreatic edema/inflammation, similar to prior. Acute pancreatitis could have this appearance. There is no dilatation of the main pancreatic duct. No findings to suggest pancreatic necrosis. 2.  No focal or rim enhancing fluid collection in the abdomen/pelvis. 3. Distended gallbladder with calcified gallstones. 4. Intra and extrahepatic biliary duct dilatation with extrahepatic common duct measuring up to 13 mm. No calcified stone visualized within the extrahepatic bile ducts on  today's study. MRCP or ERCP may prove helpful to further evaluate as clinically warranted. 5. Since the prior study, the appendix is become more distended and fluid-filled. Appendiceal diameter is abnormal today at 13 mm. Appendiceal lumen is diffusely air-filled on the study from 3 days ago. Features on today's exam may be secondary to irritation from the intraperitoneal fluid and adjacent extraperitoneal inflammatory change. Acute appendicitis cannot be definitively excluded by imaging on this study. 6. Small volume intraperitoneal free fluid. 7. Small bilateral groin hernias contain only fat. 8. Abdominal aortic aneurysm. Vascular surgery consultation recommended due to increased risk of rupture for AAA >5.5 cm. This recommendation follows ACR consensus guidelines: White Paper of the ACR Incidental Findings Committee II on Vascular Findings. J Am Coll Radiol 2013; 10:789-794. Aortic aneurysm NOS (ICD10-I71.9) Electronically Signed   By: Kennith Center M.D.   On: 01/06/2019 13:42   DG ERCP sphincterotomy  Result Date: 01/10/2019 CLINICAL DATA:  85 year old male with a history choledocholithiasis EXAM: ERCP TECHNIQUE: Multiple spot images obtained with the fluoroscopic device and submitted for interpretation post-procedure. FLUOROSCOPY TIME:  Fluoroscopy Time:  3 minutes 2 seconds COMPARISON:  CT 01/06/2019, intraop cholangio 01/08/2019 FINDINGS: Limited intraoperative fluoroscopic spot images during ERCP. Initial image demonstrates endoscope projecting over the upper abdomen with a safety wire in place. Partial opacification of the extrahepatic biliary ducts. There is deployment of a balloon retrieval catheter. No extraluminal contrast with surgical changes of cholecystectomy. IMPRESSION: Limited images of ERCP demonstrates treatment of choledocholithiasis with deployment of a balloon catheter. Please refer to the dictated operative report for full details of intraoperative findings and procedure.  Electronically Signed   By: Gilmer Mor D.O.   On: 01/10/2019 09:06   CT Angio Chest/Abd/Pel for Dissection W and/or Wo Contrast  Result Date: 01/03/2019 CLINICAL DATA:  Severe chest pain EXAM: CT ANGIOGRAPHY CHEST, ABDOMEN AND PELVIS TECHNIQUE: Multidetector CT imaging through the chest, abdomen and pelvis was performed using the standard protocol during bolus administration of intravenous contrast. Multiplanar reconstructed images and MIPs were obtained and reviewed to evaluate the vascular anatomy. CONTRAST:  OMNIPAQUE IOHEXOL 350 MG/ML SOLN COMPARISON:  12/31/2002, ultrasound 01/03/2008 FINDINGS: CTA CHEST FINDINGS Cardiovascular: Non contrasted images of the chest demonstrate no acute intramural hematoma. Aorta is nonaneurysmal. Moderate aortic atherosclerosis. Left-sided pacing device with partially visualized intracardiac leads. Extensive coronary vascular calcification. No significant pericardial effusion. Mediastinum/Nodes: Midline trachea. No thyroid mass. No significant adenopathy. Esophagus within normal limits. Lungs/Pleura: No acute consolidation or effusion. No pneumothorax. Bandlike scar atelectasis in the left upper lobe Musculoskeletal: Scoliosis and degenerative changes of the spine. Post sternotomy changes. No acute or suspicious osseous abnormality. Review of the MIP images confirms the above findings. CTA ABDOMEN AND PELVIS FINDINGS VASCULAR Aorta: Moderate aortic atherosclerosis. Fusiform infrarenal abdominal aortic aneurysm measuring 5.7 cm maximum on axial images over a 8.2 cm craniocaudad length. Incompletely opacified aneurysm sac with probable mural thrombus along the anterior inferior sac. No definitive dissection. No retroperitoneal hematoma. Celiac: Mild calcification at the origin without high-grade stenosis. Negative for occlusion or aneurysm. SMA: Calcification at the origin without significant stenosis Renals: Single right and single left renal arteries without  significant stenosis. IMA: Not well visualized.  Inadequately opacified. Inflow: Moderate aortic atherosclerosis without occlusion or  high-grade stenosis. Veins: No obvious venous abnormality within the limitations of this arterial phase study. Review of the MIP images confirms the above findings. NON-VASCULAR Hepatobiliary: No focal hepatic abnormality. Multiple calcified gallstones. Mild enlargement of the extrahepatic common bile duct, measuring up to 13 mm. No calcified stones along the course of the duct. Mild ductal enhancement. Pancreas: Peripancreatic fluid and edema consistent with acute pancreatitis. No organized fluid collections. Spleen: Normal in size without focal abnormality. Adrenals/Urinary Tract: Adrenal glands are normal. No hydronephrosis. Cyst in the mid left kidney. The bladder is normal Stomach/Bowel: Stomach is within normal limits. Appendix appears normal. No evidence of bowel wall thickening, distention, or inflammatory changes. Sigmoid colon diverticular disease without acute inflammatory change Lymphatic: No significant adenopathy Reproductive: Prostate is unremarkable. Other: Fat containing inguinal hernias. No free air. Small amount of free fluid within the abdomen and pelvis. Musculoskeletal: Chronic appearing superior endplate deformity at L4. Diffuse degenerative changes. Review of the MIP images confirms the above findings. IMPRESSION: 1. Negative for acute aortic dissection. 5.7 cm fusiform aneurysm within the infrarenal abdominal aorta without evidence for retroperitoneal hematoma. Vascular surgery consultation recommended due to increased risk of rupture for AAA >5.5 cm. This recommendation follows ACR consensus guidelines: White Paper of the ACR Incidental Findings Committee II on Vascular Findings. J Am Coll Radiol 2013; 10:789-794. Aortic aneurysm NOS (ICD10-I71.9) 2. Peripancreatic fluid and edema, suspicious for an acute pancreatitis. Recommend correlation with enzymes.  Small free fluid within the abdomen and pelvis. 3. Gallstones. Dilated common bile duct up to 13 mm which should be correlated with LFT. There may be mild ductal enhancement and biliary infection cannot be excluded. 4. Sigmoid colon diverticular disease without acute inflammatory change Electronically Signed   By: Jasmine Pang M.D.   On: 01/03/2019 19:33    Microbiology: Recent Results (from the past 240 hour(s))  C difficile quick scan w PCR reflex     Status: None   Collection Time: 01/07/19  5:56 PM   Specimen: STOOL  Result Value Ref Range Status   C Diff antigen NEGATIVE NEGATIVE Final   C Diff toxin NEGATIVE NEGATIVE Final   C Diff interpretation No C. difficile detected.  Final    Comment: Performed at [facility], [address] [place], [place] [address]  GI pathogen panel by PCR, stool     Status: None   Collection Time: 01/07/19  5:56 PM  Result Value Ref Range Status   Plesiomonas shigelloides NOT DETECTED NOT DETECTED Final   Yersinia enterocolitica NOT DETECTED NOT DETECTED Final   Vibrio NOT DETECTED NOT DETECTED Final   Enteropathogenic E coli NOT DETECTED NOT DETECTED Final   E coli (ETEC) LT/ST NOT DETECTED NOT DETECTED Final   E coli 0157 by PCR Not applicable NOT DETECTED Final   Cryptosporidium by PCR NOT DETECTED NOT DETECTED Final   Entamoeba histolytica NOT DETECTED NOT DETECTED Final   Adenovirus F 40/41 NOT DETECTED NOT DETECTED Final   Norovirus GI/GII NOT DETECTED NOT DETECTED Final   Sapovirus NOT DETECTED NOT DETECTED Final    Comment: (NOTE) Performed At: [facility] [address] [place], [place] [id number] Jolene Schimke MD XB:[id number]    Vibrio cholerae NOT DETECTED NOT DETECTED Final   Campylobacter by PCR NOT DETECTED NOT DETECTED Final   Salmonella by PCR NOT DETECTED NOT DETECTED Final   E coli (STEC) NOT DETECTED NOT DETECTED Final   Enteroaggregative E coli NOT DETECTED NOT DETECTED Final   Shigella by PCR  NOT DETECTED  NOT DETECTED Final   Cyclospora cayetanensis NOT DETECTED NOT DETECTED Final   Astrovirus NOT DETECTED NOT DETECTED Final   G lamblia by PCR NOT DETECTED NOT DETECTED Final   Rotavirus A by PCR NOT DETECTED NOT DETECTED Final  Surgical PCR screen     Status: None   Collection Time: 01/08/19  6:51 AM   Specimen: Nasal Mucosa; Nasal Swab  Result Value Ref Range Status   MRSA, PCR NEGATIVE NEGATIVE Final   Staphylococcus aureus NEGATIVE NEGATIVE Final    Comment: (NOTE) The Xpert SA Assay (FDA approved for NASAL specimens in patients 35 years of age and older), is one component of a comprehensive surveillance program. It is not intended to diagnose infection nor to guide or monitor treatment. Performed at [facility], [address] [place], [place] [address]      Labs: Basic Metabolic Panel: Recent Labs  Lab 01/08/19 0404 01/10/19 0451 01/10/19 1115 01/11/19 0500 01/12/19 0631 01/13/19 0304 01/14/19 0423  NA 133* 132*  --  132* 131* 131* 131*  K 3.0* 3.7  --  3.8 3.5 3.6 4.2  CL 101 98  --  100 95* 95* 95*  CO2 22 24  --  23 26 27 26   GLUCOSE 157* 179*  --  185* 189* 222* 195*  BUN 11 6*  --  11 10 8  7*  CREATININE 0.75 0.82  --  1.12 1.21 0.89 0.86  CALCIUM 7.9* 8.2*  --  8.0* 8.1* 8.2* 8.2*  MG 1.8  --  1.8  --  1.8 1.6* 1.9   Liver Function Tests: Recent Labs  Lab 01/10/19 0451 01/11/19 0500 01/12/19 0631 01/13/19 0304 01/14/19 0423  AST 282* 107* 55* 35 30  ALT 321* 214* 158* 116* 90*  ALKPHOS 443* 392* 389* 323* 284*  BILITOT 4.0* 2.9* 2.4* 1.6* 1.4*  PROT 5.5* 5.3* 5.9* 5.6* 5.6*  ALBUMIN 2.4* 2.3* 2.6* 2.4* 2.6*   No results for input(s): LIPASE, AMYLASE in the last 168 hours. No results for input(s): AMMONIA in the last 168 hours. CBC: Recent Labs  Lab 01/08/19 0404 01/11/19 0500 01/11/19 1648 01/12/19 0631 01/13/19 0304 01/14/19 0423  WBC 7.8 5.5 5.6 7.2 6.7 7.8  NEUTROABS 6.3 4.3  --   --   --   --   HGB 10.6* 10.4* 10.8* 11.4*  10.5* 10.8*  HCT 30.8* 30.7* 31.2* 32.6* 30.8* 32.1*  MCV 103.0* 101.3* 100.0 99.4 100.0 100.9*  PLT 131* 143* 151 176 179 204   Cardiac Enzymes: No results for input(s): CKTOTAL, CKMB, CKMBINDEX, TROPONINI in the last 168 hours. BNP: BNP (last 3 results) Recent Labs    03/04/18 0030 11/11/18 1240 01/03/19 1548  BNP 219.8* 1,295.4* 295.0*    ProBNP (last 3 results) No results for input(s): PROBNP in the last 8760 hours.  CBG: Recent Labs  Lab 01/13/19 1207 01/13/19 1711 01/13/19 2137 01/14/19 0803 01/14/19 1309  GLUCAP 166* 272* 188* 231* 210*       Signed:  Janaysia Mcleroy  [facility] 01/14/2019, 4:55 PM

## 2019-01-15 NOTE — Telephone Encounter (Signed)
Patient contacted regarding discharge from [place] on 01/14/19.  Patient understands to follow up with provider  Fergus PA on 01/20/19 at  3;00 PM at [facility]. Patient understands discharge instructions? YES Patient understands medications and regiment? YES Patient understands to bring all medications to this visit? YES  PT WILL PICK UP THE [name] 250 MG SCRIPT AT PHARMACY TODAY

## 2019-01-16 DIAGNOSIS — E1159 Type 2 diabetes mellitus with other circulatory complications: Secondary | ICD-10-CM | POA: Diagnosis not present

## 2019-01-16 DIAGNOSIS — I5023 Acute on chronic systolic (congestive) heart failure: Secondary | ICD-10-CM | POA: Diagnosis not present

## 2019-01-16 DIAGNOSIS — I25119 Atherosclerotic heart disease of native coronary artery with unspecified angina pectoris: Secondary | ICD-10-CM | POA: Diagnosis not present

## 2019-01-16 DIAGNOSIS — I255 Ischemic cardiomyopathy: Secondary | ICD-10-CM | POA: Diagnosis not present

## 2019-01-16 DIAGNOSIS — J449 Chronic obstructive pulmonary disease, unspecified: Secondary | ICD-10-CM | POA: Diagnosis not present

## 2019-01-16 DIAGNOSIS — I7300 Raynaud's syndrome without gangrene: Secondary | ICD-10-CM | POA: Diagnosis not present

## 2019-01-16 DIAGNOSIS — Z7984 Long term (current) use of oral hypoglycemic drugs: Secondary | ICD-10-CM | POA: Diagnosis not present

## 2019-01-16 DIAGNOSIS — I6521 Occlusion and stenosis of right carotid artery: Secondary | ICD-10-CM | POA: Diagnosis not present

## 2019-01-16 DIAGNOSIS — Z48815 Encounter for surgical aftercare following surgery on the digestive system: Secondary | ICD-10-CM | POA: Diagnosis not present

## 2019-01-16 DIAGNOSIS — Z87891 Personal history of nicotine dependence: Secondary | ICD-10-CM | POA: Diagnosis not present

## 2019-01-16 DIAGNOSIS — Z951 Presence of aortocoronary bypass graft: Secondary | ICD-10-CM | POA: Diagnosis not present

## 2019-01-16 DIAGNOSIS — Z7901 Long term (current) use of anticoagulants: Secondary | ICD-10-CM | POA: Diagnosis not present

## 2019-01-16 DIAGNOSIS — Z953 Presence of xenogenic heart valve: Secondary | ICD-10-CM | POA: Diagnosis not present

## 2019-01-16 DIAGNOSIS — Z8673 Personal history of transient ischemic attack (TIA), and cerebral infarction without residual deficits: Secondary | ICD-10-CM | POA: Diagnosis not present

## 2019-01-16 DIAGNOSIS — Z95810 Presence of automatic (implantable) cardiac defibrillator: Secondary | ICD-10-CM | POA: Diagnosis not present

## 2019-01-16 DIAGNOSIS — I480 Paroxysmal atrial fibrillation: Secondary | ICD-10-CM | POA: Diagnosis not present

## 2019-01-16 DIAGNOSIS — I714 Abdominal aortic aneurysm, without rupture: Secondary | ICD-10-CM | POA: Diagnosis not present

## 2019-01-16 DIAGNOSIS — I495 Sick sinus syndrome: Secondary | ICD-10-CM | POA: Diagnosis not present

## 2019-01-16 DIAGNOSIS — G453 Amaurosis fugax: Secondary | ICD-10-CM | POA: Diagnosis not present

## 2019-01-16 DIAGNOSIS — M6281 Muscle weakness (generalized): Secondary | ICD-10-CM | POA: Diagnosis not present

## 2019-01-16 DIAGNOSIS — I472 Ventricular tachycardia: Secondary | ICD-10-CM | POA: Diagnosis not present

## 2019-01-20 ENCOUNTER — Encounter: Payer: Self-pay | Admitting: Physician Assistant

## 2019-01-20 ENCOUNTER — Other Ambulatory Visit: Payer: Self-pay

## 2019-01-20 ENCOUNTER — Ambulatory Visit (INDEPENDENT_AMBULATORY_CARE_PROVIDER_SITE_OTHER): Payer: Medicare Other | Admitting: Physician Assistant

## 2019-01-20 VITALS — BP 119/78 | HR 87 | Temp 97.0°F | Ht 73.0 in | Wt 200.6 lb

## 2019-01-20 DIAGNOSIS — I4819 Other persistent atrial fibrillation: Secondary | ICD-10-CM | POA: Diagnosis not present

## 2019-01-20 DIAGNOSIS — I25810 Atherosclerosis of coronary artery bypass graft(s) without angina pectoris: Secondary | ICD-10-CM

## 2019-01-20 DIAGNOSIS — E119 Type 2 diabetes mellitus without complications: Secondary | ICD-10-CM

## 2019-01-20 DIAGNOSIS — Z95810 Presence of automatic (implantable) cardiac defibrillator: Secondary | ICD-10-CM

## 2019-01-20 DIAGNOSIS — I5022 Chronic systolic (congestive) heart failure: Secondary | ICD-10-CM

## 2019-01-20 DIAGNOSIS — I714 Abdominal aortic aneurysm, without rupture, unspecified: Secondary | ICD-10-CM

## 2019-01-20 DIAGNOSIS — I255 Ischemic cardiomyopathy: Secondary | ICD-10-CM

## 2019-01-20 DIAGNOSIS — Z9889 Other specified postprocedural states: Secondary | ICD-10-CM | POA: Diagnosis not present

## 2019-01-20 DIAGNOSIS — E785 Hyperlipidemia, unspecified: Secondary | ICD-10-CM

## 2019-01-20 DIAGNOSIS — I10 Essential (primary) hypertension: Secondary | ICD-10-CM

## 2019-01-20 NOTE — Patient Instructions (Addendum)
Medication Instructions:  Your physician recommends that you continue on your current medications as directed. Please refer to the Current Medication list given to you today. *If you need a refill on your cardiac medications before your next appointment, please call your pharmacy*  Lab Work: Your physician recommends that you return for lab work in: 1-2 weeks BMET If you have labs (blood work) drawn today and your tests are completely normal, you will receive your results only by: Marland Kitchen MyChart Message (if you have MyChart) OR . A paper copy in the mail If you have any lab test that is abnormal or we need to change your treatment, we will call you to review the results.  Testing/Procedures: None   Follow-Up: At [facility], you and your health needs are our priority.  As part of our continuing mission to provide you with exceptional heart care, we have created designated Provider Care Teams.  These Care Teams include your primary Cardiologist (physician) and Advanced Practice Providers (APPs -  Physician Assistants and Nurse Practitioners) who all work together to provide you with the care you need, when you need it.  Your next appointment:   3-4 month(s)  The format for your next appointment:   In Person  Provider:   Sherren Mocha, MD  Other Instructions ONCE YOU GET HOME PLEASE CALL OUR OFFICE AND GIVE [name] THE NUMBER FOR THE [facility] NURSE.

## 2019-01-20 NOTE — Progress Notes (Signed)
Cardiology Office Note:    Date:  01/21/2019   ID:  [name], DOB May 10, 1931, MRN [id number]  PCP:  Tony Melter, MD  Cardiologist:  Sherren Mocha, MD  Electrophysiologist:  Tony Axe, MD   Referring MD: Tony Melter, MD   Chief Complaint  Patient presents with  . Follow-up    seen for Dr. Burt [name].     History of Present Illness:    [name] is a 85 y.o. male with a hx of CAD s/p CABG, mitral valve repair, ICM, VT s/p AICD (STJ dual chamber ICD 2004, gen change 2016 to MDT device), HTN, DM II, PAF and HLD.  Last cardiac catheterization in December 2018 showed severe two-vessel CAD with patent LIMA to LAD and right radial graft to OM, moderate, severely calcified proximal left circumflex stenosis with negative FFR, normal right heart hemodynamics.  Patient had persistent atrial fibrillation in 2019 and was very symptomatic.  He failed cardioversion x2 however converted spontaneously afterward.  Last echocardiogram obtained in November 2019 showed EF 35 to 40%, severe LAE, PA peak pressure 50 mmHg.  Patient was admitted in February 2020 with syncope, this was felt to be orthostatic in nature.  ICD interrogation showed atrial fibrillation with controlled heart rate, no significant event on the day of syncope.  He was most recently seen by Dr. Caryl [name] in July 2020 at which time he complained of shortness of breath in the context of stress which was felt to be adrenergic stimulation.  Metoprolol was increased from 50 mg to 100 mg twice daily.  More recently, patient has had 3 hospital admissions since early October.  He was admitted from 10/5-10/9 with amaurosis fugax of right eye.  Work-up demonstrated significant right carotid artery disease.  Patient was seen by Dr. Stanford Breed who has cleared the patient to proceed with carotid endarterectomy.  Echocardiogram obtained during the admission showed EF 35 to 40%, global LV hypokinesis, apical akinesis, bilateral enlargement,  severe mitral calcification.  He was admitted again from 10/12-10/17 shortness of breath, orthopnea and PND.  He had a elevated BNP of 1295, and hyponatremia with bilateral pleural effusion.  He was treated for acute heart failure.  CT angiogram of the neck demonstrated possible thrombus and more likely residual residual stenosis proximal to the common carotid artery.  He was restarted on Eliquis.  Sodium reached a nadir of 114.  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  He was treated with tolvaptan and fluid restriction.  Most recently, patient was admitted in December 2020 for acute abdominal pain, and found to have pancreatitis with gallstones.  He was evaluated by cardiology again for preoperative clearance.  Incidentally, he also was found to have a 5.6 cm AAA and was evaluated by vascular surgery.  Vascular surgery felt this is stable and do not recommend surgical repair at this time.  Patient eventually underwent laparoscopic cholecystectomy by Dr. Georgette [name].  Postop course was complicated by volume overload and required IV diuresis.  He was eventually discharged on 40 mg twice daily of Lasix.  Discharge weight was 93.6 kg or 206 pounds.  Patient presents today for cardiology office visit.  He is weight today is 200 pounds.  He says he has weaned Lasix to 20 mg daily at this point.  I recommend that he obtain a repeat basic metabolic panel in 1 week to make sure he is not being dehydrated.  His primary care provider obtained lab work shortly  after his discharge and renal function was stable at that time.  He still have dyspnea with exertion, this is his previous symptom associated with persistent atrial fibrillation.  He has a remote device that he can activate for his ICD to shock him.  He has tried this at home in the past on the 500 mcg of [name] and that was unable to convert him back to a sinus rhythm.  During the recent hospitalization His [name] was  decreased to 250 mcg twice daily.  At this time, I do not think outpatient cardioversion has high rate of success if he was unable to convert even on the previous 500 mcg.  He has a follow-up with Dr. Caryl [name] next week, I think this will be a better question to Dr. Caryl [name] to see if there is anything that can be done to convert him to a sinus rhythm.  Otherwise, he does not appear to be volume overloaded, with the decreased weight, I am actually concerned he might be dry.  He has follow-up with vascular surgery to discuss further plan regarding the AAA.  Past Medical History:  Diagnosis Date  . Acute on chronic combined systolic (congestive) and diastolic (congestive) heart failure ([place])   . AICD (automatic cardioverter/defibrillator) present    Medtronicn PPM/ICD  . Carotid stenosis, right    s/p endarterectomy 11/07/18  . Chronic combined systolic (congestive) and diastolic (congestive) heart failure ([place])   . Coronary artery disease    [facility]  . Diabetes mellitus   . Dysrhythmia   . Endocarditis, valve unspecified    Mitral  . Headache    hx migraines 6-7 x mo  . History of migraine headaches   . History of seasonal allergies   . ICD (implantable cardiac defibrillator) in place   . PAF (paroxysmal atrial fibrillation) ([place])    NO LAA occlusion at the time of surgery  [name] 2018  . Pleural effusion   . PVC's (premature ventricular contractions)   . Ventricular tachycardia, polymorphic ([place])    status post ICD implantation    Past Surgical History:  Procedure Laterality Date  . CARDIAC CATHETERIZATION  04/03/2007   EF 40%  . CARDIAC CATHETERIZATION  02/06/2006   EF 45%. ANTERIOR HYPOKINESIS  . CARDIAC CATHETERIZATION  05/29/2000   EF 50%. MILD ANTERIOR HYPOKINESIS  . CARDIAC CATHETERIZATION  10/25/1999   EF 50%. SEVERE MITRAL REGURGITATION  . CARDIAC CATHETERIZATION  01/06/2003   EF 50%  . CARDIAC DEFIBRILLATOR PLACEMENT    . CATARACT EXTRACTION W/  INTRAOCULAR LENS  IMPLANT, BILATERAL    . CHOLECYSTECTOMY N/A 01/08/2019   Procedure: LAPAROSCOPIC CHOLECYSTECTOMY WITH INTRAOPERATIVE CHOLANGIOGRAM;  Surgeon: Donnie Mesa, MD;  Location: [place];  Service: General;  Laterality: N/A;  . CORONARY ARTERY BYPASS GRAFT  2001   2 VESSEL CABG AND MITRAL VALVE REPAIR. HE HAD A LIMA GRAFT TO THE LAD AND RADIAL ARTERY  GRAFT TO THE OBTUSE MARGINAL  OF LEFT CIRCUMFLEX  . ENDARTERECTOMY Right 11/07/2018   Procedure: ENDARTERECTOMY CAROTID RIGHT;  Surgeon: Waynetta Sandy, MD;  Location: [place];  Service: Vascular;  Laterality: Right;  . EP IMPLANTABLE DEVICE N/A 11/06/2014   Procedure: ICD Generator Changeout;  Surgeon: Deboraha Sprang, MD;  Location: [place] [facility];  Service: Cardiovascular;  Laterality: N/A;  . ERCP N/A 01/10/2019   Procedure: ENDOSCOPIC RETROGRADE CHOLANGIOPANCREATOGRAPHY (ERCP);  Surgeon: Clarene Essex, MD;  Location: [place];  Service: Endoscopy;  Laterality: N/A;  . FOREIGN BODY  REMOVAL Right 06/21/2017   Procedure: FOREIGN BODY REMOVAL ADULT RIGHT RING FINGER;  Surgeon: Leanora Cover, MD;  Location: [place];  Service: Orthopedics;  Laterality: Right;  . HEMORRHOID SURGERY    . HEMORROIDECTOMY    . INTRAVASCULAR PRESSURE WIRE/FFR STUDY N/A 01/05/2017   Procedure: INTRAVASCULAR PRESSURE WIRE/FFR STUDY;  Surgeon: Sherren Mocha, MD;  Location: [place] [facility];  Service: Cardiovascular;  Laterality: N/A;  . KNEE ARTHROSCOPY     RIGHT KNEE  . LAPAROSCOPIC APPENDECTOMY N/A 01/08/2019   Procedure: APPENDECTOMY LAPAROSCOPIC;  Surgeon: Donnie Mesa, MD;  Location: [place];  Service: General;  Laterality: N/A;  . LEFT HEART CATHETERIZATION WITH CORONARY ANGIOGRAM N/A 07/04/2012   Procedure: LEFT HEART CATHETERIZATION WITH CORONARY ANGIOGRAM;  Surgeon: Sherren Mocha, MD;  Location: [facility] CATH LAB;  Service: Cardiovascular;  Laterality: N/A;  . MITRAL VALVE REPLACEMENT     No LAA occlusion at the time of surgery  Karie Soda report 2018   . REMOVAL OF STONES  01/10/2019   Procedure: REMOVAL OF STONES;  Surgeon: Clarene Essex, MD;  Location: [place];  Service: Endoscopy;;  . RIGHT/LEFT HEART CATH AND CORONARY/GRAFT ANGIOGRAPHY N/A 01/05/2017   Procedure: RIGHT/LEFT HEART CATH AND CORONARY/GRAFT ANGIOGRAPHY;  Surgeon: Sherren Mocha, MD;  Location: [place] [facility];  Service: Cardiovascular;  Laterality: N/A;  . SPHINCTEROTOMY  01/10/2019   Procedure: SPHINCTEROTOMY;  Surgeon: Clarene Essex, MD;  Location: [facility] ENDOSCOPY;  Service: Endoscopy;;  . TRANSTHORACIC ECHOCARDIOGRAM  04/02/2007   EF 35%    Current Medications: No outpatient medications have been marked as taking for the 01/20/19 encounter (Office Visit) with Almyra Deforest, [place].     Allergies:   Bee venom, Latex, Metformin, Rivaroxaban, Sotalol, and Warfarin sodium   Social History   Socioeconomic History  . Marital status: Married    Spouse name: Mardene Celeste  . Number of children: 2  . Years of education: Not on file  . Highest education level: Associate degree: academic program  Occupational History  . Occupation: retired  Tobacco Use  . Smoking status: Former Smoker    Packs/day: 2.00    Years: 16.00    Pack years: 32.00    Types: Cigarettes    Start date: 02/23/1956    Quit date: 01/31/1971    Years since quitting: 48.0  . Smokeless tobacco: Never Used  Substance and Sexual Activity  . Alcohol use: Yes    Alcohol/week: 0.0 standard drinks    Comment: 1-2 a week  . Drug use: No  . Sexual activity: Yes  Other Topics Concern  . Not on file  Social History Narrative   Patient is right-handed. He lives with his wife ina 2 story home. He drinks one cup of coffee and a diet coke a day.    Social Determinants of Health   Financial Resource Strain:   . Difficulty of Paying Living Expenses: Not on file  Food Insecurity:   . Worried About Charity fundraiser in the Last Year: Not on file  . Ran Out of Food in the Last Year: Not on file  Transportation Needs:    . Lack of Transportation (Medical): Not on file  . Lack of Transportation (Non-Medical): Not on file  Physical Activity:   . Days of Exercise per Week: Not on file  . Minutes of Exercise per Session: Not on file  Stress:   . Feeling of Stress : Not on file  Social Connections:   . Frequency of Communication with Friends and Family:  Not on file  . Frequency of Social Gatherings with Friends and Family: Not on file  . Attends Religious Services: Not on file  . Active Member of Clubs or Organizations: Not on file  . Attends Archivist Meetings: Not on file  . Marital Status: Not on file     Family History: The patient's family history includes Cancer in his sister and another family member; Diabetes in an other family member; Emphysema in his father; Heart disease in his father, paternal grandfather, and paternal grandmother; Stroke in his mother.  ROS:   Please see the history of present illness.     All other systems reviewed and are negative.  EKGs/Labs/Other Studies Reviewed:    The following studies were reviewed today:  Echo 11/05/2018 1. Left ventricular ejection fraction, by visual estimation, is 35 to 40%. The left ventricle has moderately decreased function. There is global left ventricular hypokinesis and marked apical-basal and septal-lateral dyssynchrony. There appears to be  apical akinesis.  2. Abnormal septal motion consistent with RV pacemaker.  3. Global right ventricle has moderately reduced systolic function.The right ventricular size is normal. No increase in right ventricular wall thickness.  4. Left atrial size was severely dilated.  5. Right atrial size was moderately dilated.  6. Mild mitral valve regurgitation. Mild-moderate mitral stenosis. Peak and mean gradients are 16 and 5 mm Hg, respectively, at 70 bpm. Estimated valve area is 1.4 cm sq by the PHT method.  7. Moderate calcification of the mitral valve leaflet(s).  8. Severe mitral annular  calcification.  9. Severe thickening of the mitral valve leaflet(s). 10. The tricuspid valve is normal in structure. Tricuspid valve regurgitation is mild-moderate. 11. The aortic valve is normal in structure. Aortic valve regurgitation was not visualized by color flow Doppler. 12. The pulmonic valve was normal in structure. Pulmonic valve regurgitation is mild by color flow Doppler. 13. Mildly elevated pulmonary artery systolic pressure. 14. A pacer wire is visualized. 15. The inferior vena cava is dilated in size with >50% respiratory variability, suggesting right atrial pressure of 8 mmHg. 16. Left ventricular diastolic function could not be evaluated due to atrial fibrillation and mitral stenosis. 58. Compared to February 2020, mitral valve gradients are worse, probably due to new atrial fibrillation and increased ventricular rate  EKG:  EKG is not ordered today.   Recent Labs: 01/03/2019: B Natriuretic Peptide 295.0 01/08/2019: TSH 2.274 01/14/2019: ALT 90; BUN 7; Creatinine, Ser 0.86; Hemoglobin 10.8; Magnesium 1.9; Platelets 204; Potassium 4.2; Sodium 131  Recent Lipid Panel    Component Value Date/Time   CHOL 110 11/05/2018 0256   TRIG 90 11/05/2018 0256   HDL 48 11/05/2018 0256   CHOLHDL 2.3 11/05/2018 0256   VLDL 18 11/05/2018 0256   LDLCALC 44 11/05/2018 0256    Physical Exam:    VS:  BP 119/78   Pulse 87   Temp (!) 97 F (36.1 C)   Ht 6\' 1"  (1.854 m)   Wt 200 lb 9.6 oz (91 kg)   SpO2 100%   BMI 26.47 kg/m     Wt Readings from Last 3 Encounters:  01/20/19 200 lb 9.6 oz (91 kg)  01/14/19 206 lb 5.6 oz (93.6 kg)  12/13/18 208 lb 9.6 oz (94.6 kg)     GEN:  Well nourished, well developed in no acute distress HEENT: Normal NECK: No JVD; No carotid bruits LYMPHATICS: No lymphadenopathy CARDIAC: RRR, no murmurs, rubs, gallops RESPIRATORY:  Clear to auscultation without rales, wheezing  or rhonchi  ABDOMEN: Soft, non-tender, non-distended MUSCULOSKELETAL:  No  edema; No deformity  SKIN: Warm and dry NEUROLOGIC:  Alert and oriented x 3 PSYCHIATRIC:  Normal affect   ASSESSMENT:    1. Persistent atrial fibrillation ([place])   2. Chronic systolic heart failure ([place])   3. Coronary artery disease involving coronary bypass graft of native heart without angina pectoris   4. H/O mitral valve repair   5. Ischemic cardiomyopathy   6. ICD (implantable cardioverter-defibrillator) in place   7. Essential hypertension   8. Controlled type 2 diabetes mellitus without complication, without long-term current use of insulin ([place])   9. Hyperlipidemia LDL goal <70   10. AAA (abdominal aortic aneurysm) without rupture ([facility])    PLAN:    In order of problems listed above:  1. Persistent atrial fibrillation: Patient has been compliant with [name] 250 mcg twice daily and metoprolol along with Eliquis.  [name] was recently decreased to 250 mcg twice a day from previous 500 mcg twice a day.  Patient mentioned that he has been in atrial fibrillation since October.  He has tried to shock himself out of A. fib using the remote Medtronic device, this has not succeeded despite multiple attempt.  He has follow-up with Dr. Caryl [name] next week, I recommend he discuss this with with Dr. Caryl [name]  2. Chronic systolic heart failure: Last echocardiogram showed EF of 35 to 40% in October.  He has appears to be euvolemic today.  His weight today is 6 pounds less than discharge weight.  I am worried he might be dry.  Fortunately he has weaned his 40 mg twice daily of Lasix upon discharge to 20 mg daily at this point.  I recommend a basic metabolic panel next week and if he is dry I likely will change the Lasix back to as needed dose  3. CAD s/p CABG: Denies any obvious anginal symptom  4. History of mitral valve repair: Stable on recent echocardiogram  5. Ischemic cardiomyopathy s/p ICD: Managed by Dr. Caryl [name]  6. Hypertension: Blood pressure stable  7. DM2: Managed by primary care  provider  8. Hyperlipidemia: Continue Lipitor 80 mg daily  9. AAA: Followed by vascular surgery as outpatient.   Medication Adjustments/Labs and Tests Ordered: Current medicines are reviewed at length with the patient today.  Concerns regarding medicines are outlined above.  Orders Placed This Encounter  Procedures  . Basic Metabolic Panel (BMET)   No orders of the defined types were placed in this encounter.   Patient Instructions  Medication Instructions:  Your physician recommends that you continue on your current medications as directed. Please refer to the Current Medication list given to you today. *If you need a refill on your cardiac medications before your next appointment, please call your pharmacy*  Lab Work: Your physician recommends that you return for lab work in: 1-2 weeks BMET If you have labs (blood work) drawn today and your tests are completely normal, you will receive your results only by: Marland Kitchen MyChart Message (if you have MyChart) OR . A paper copy in the mail If you have any lab test that is abnormal or we need to change your treatment, we will call you to review the results.  Testing/Procedures: None   Follow-Up: At [facility], you and your health needs are our priority.  As part of our continuing mission to provide you with exceptional heart care, we have created designated Provider Care Teams.  These Care Teams include your primary Cardiologist (  physician) and Advanced Practice Providers (APPs -  Physician Assistants and Nurse Practitioners) who all work together to provide you with the care you need, when you need it.  Your next appointment:   3-4 month(s)  The format for your next appointment:   In Person  Provider:   Sherren Mocha, MD  Other Instructions ONCE YOU GET HOME PLEASE CALL OUR OFFICE AND GIVE [name] THE NUMBER FOR THE [facility] NURSE.     Hilbert Corrigan, [place]  01/21/2019 2:46 PM    [facility]

## 2019-01-21 ENCOUNTER — Encounter: Payer: Self-pay | Admitting: Physician Assistant

## 2019-01-21 ENCOUNTER — Telehealth: Payer: Self-pay

## 2019-01-21 NOTE — Telephone Encounter (Signed)
Called [facility] to see if there was a form that we could complete, so the patient can continue to receive a home health care.   I spoke with a woman who stated we can not extend care, and that it has to be in the discharge note for the patient can continue recieving care.  Spoke with Isaac Laud who states he will give them a call himself after clinic.

## 2019-01-21 NOTE — Telephone Encounter (Signed)
Order faxed to [facility]. I will call in the morning to see if it has been received and then I will contact the patient.

## 2019-01-21 NOTE — Telephone Encounter (Signed)
Spoke with [name] with [facility]. She recommended I write a order and fax it to them. Fx number [phone]  Home Health RN for Cardiopulmonary Assessment and Teaching 3 times a week for 4 weeks

## 2019-01-22 ENCOUNTER — Encounter: Payer: Medicare Other | Admitting: Cardiology

## 2019-01-22 DIAGNOSIS — Z48815 Encounter for surgical aftercare following surgery on the digestive system: Secondary | ICD-10-CM | POA: Diagnosis not present

## 2019-01-22 DIAGNOSIS — I25119 Atherosclerotic heart disease of native coronary artery with unspecified angina pectoris: Secondary | ICD-10-CM | POA: Diagnosis not present

## 2019-01-22 DIAGNOSIS — G453 Amaurosis fugax: Secondary | ICD-10-CM | POA: Diagnosis not present

## 2019-01-22 DIAGNOSIS — I255 Ischemic cardiomyopathy: Secondary | ICD-10-CM | POA: Diagnosis not present

## 2019-01-22 DIAGNOSIS — M6281 Muscle weakness (generalized): Secondary | ICD-10-CM | POA: Diagnosis not present

## 2019-01-22 DIAGNOSIS — I5023 Acute on chronic systolic (congestive) heart failure: Secondary | ICD-10-CM | POA: Diagnosis not present

## 2019-01-22 NOTE — Telephone Encounter (Signed)
[name] at [facility] to ask if she received the order that was faxed over yesterday. She stated she had not. I will refax the order.

## 2019-01-22 NOTE — Telephone Encounter (Signed)
Order written on rx pad placed to be scanned into patients chart.

## 2019-01-22 NOTE — Telephone Encounter (Signed)
Spoke with receptionist at [facility] and she stated she received the order.   Spoke with patient and notified him that the order was faxed and received by [facility] and they should be in contact with him soon. He voiced understanding and thanked me for my call.

## 2019-01-23 DIAGNOSIS — G453 Amaurosis fugax: Secondary | ICD-10-CM | POA: Diagnosis not present

## 2019-01-23 DIAGNOSIS — I255 Ischemic cardiomyopathy: Secondary | ICD-10-CM | POA: Diagnosis not present

## 2019-01-23 DIAGNOSIS — Z48815 Encounter for surgical aftercare following surgery on the digestive system: Secondary | ICD-10-CM | POA: Diagnosis not present

## 2019-01-23 DIAGNOSIS — M6281 Muscle weakness (generalized): Secondary | ICD-10-CM | POA: Diagnosis not present

## 2019-01-23 DIAGNOSIS — I5023 Acute on chronic systolic (congestive) heart failure: Secondary | ICD-10-CM | POA: Diagnosis not present

## 2019-01-23 DIAGNOSIS — I25119 Atherosclerotic heart disease of native coronary artery with unspecified angina pectoris: Secondary | ICD-10-CM | POA: Diagnosis not present

## 2019-01-28 ENCOUNTER — Encounter: Payer: Self-pay | Admitting: Internal Medicine

## 2019-01-28 ENCOUNTER — Other Ambulatory Visit: Payer: Self-pay

## 2019-01-28 ENCOUNTER — Ambulatory Visit (INDEPENDENT_AMBULATORY_CARE_PROVIDER_SITE_OTHER): Payer: Medicare Other | Admitting: Internal Medicine

## 2019-01-28 VITALS — BP 116/72 | HR 79 | Ht 73.0 in | Wt 198.4 lb

## 2019-01-28 DIAGNOSIS — I255 Ischemic cardiomyopathy: Secondary | ICD-10-CM

## 2019-01-28 DIAGNOSIS — I480 Paroxysmal atrial fibrillation: Secondary | ICD-10-CM

## 2019-01-28 MED ORDER — DOFETILIDE 500 MCG PO CAPS: 500.0000 ug | ORAL_CAPSULE | Freq: Two times a day (BID) | ORAL | 3 refills | Status: DC

## 2019-01-28 NOTE — Progress Notes (Signed)
Patient Care Team: [name], MD as PCP - General (Family Medicine) Sherren Mocha, MD as PCP - Cardiology (Cardiology) Deboraha Sprang, MD as PCP - Electrophysiology (Cardiology)   HPI  [name] is a 85 y.o. male Seen in followup for polymorphic ventricular tachycardia for which he has an ICD as well as persistent atrial fibrillation which was quite symptomatic and prompted a visit to the A. fib clinic with the plan for anticoagulation and TEE cardioversion.  He had failed home shocks x2.  There has been an increased burden of Afib He converted then spontaneously.  He remains on Eliquis at this time    Metoprolol was increased but without effect  AFib assoc with slowish Ventricular response but with dyspnea on exertion and fatigue.  He is quite aware of when he is in A. fib and when he is not.  Antiarrhythmics Date Reason stopped  dofetilide 9/20 ineffective  Amiodarone / Tried years ago following CABG stopped but pt does not recall adverse effects    History of coronary disease with prior CABG mitral valve repair both of which were done at [facility]  PFO closure at that time.    DATE TEST EF   6/14 LHC 35-40% LIMA/ RIMA patent   2/17 Echo   30 %   12/18 LHC   LIMA-LAD and right radial graft-OM- patent proximal cx stenosis with negative FFR  11/19 Echo  35-40% LAE Severe (69/3.0/63) Pulm HTN 50  10/20 Echo  35-40% BAE  Pulm NTH 41 mm       Date Cr K Mg Hgb  9/16  0.99 4.2 2.1   12/17  1.08 4.6 2.1   5/19 1.00 4.5 2.2   10/19 1.04 4.4  13.2 (11/19)            He was started on Eliquis as noted above.   3 hospitalizations this fall.  #1-amaurosis fugax right carotid disease and underwent endarterectomy. #2-CHF noted with IV diuresis #3-gallstone pancreatitis found to have a AAA-5.6 cm; underwent lap chole.  Complicated by heart failure with IV diuresis  Efforts to control his atrial fibrillation prompted the reinitiation of amiodarone.  Therapy was  associated with a rash and was discontinued and he was resumed on dofetilide  He continues to struggle with sob, esp when in afib, but better than it was a few weeks ago, consistent with optivol elevated but improving   Device History: STJ dual chamber ICD implanted 2004 for PMVT, ICM; generator change 2010; gen change 2016 (MDT device) History of appropriate therapy: Yes History of AAD therapy: Yes   Past Medical History:  Diagnosis Date  . Acute on chronic combined systolic (congestive) and diastolic (congestive) heart failure ([place])   . AICD (automatic cardioverter/defibrillator) present    Medtronicn PPM/ICD  . Carotid stenosis, right    s/p endarterectomy 11/07/18  . Chronic combined systolic (congestive) and diastolic (congestive) heart failure ([place])   . Coronary artery disease    [facility]  . Diabetes mellitus   . Dysrhythmia   . Endocarditis, valve unspecified    Mitral  . Headache    hx migraines 6-7 x mo  . History of migraine headaches   . History of seasonal allergies   . ICD (implantable cardiac defibrillator) in place   . PAF (paroxysmal atrial fibrillation) ([place])    NO LAA occlusion at the time of surgery  [name] 2018  . Pleural effusion   . PVC's (premature ventricular contractions)   .  Ventricular tachycardia, polymorphic ([place])    status post ICD implantation    Past Surgical History:  Procedure Laterality Date  . CARDIAC CATHETERIZATION  04/03/2007   EF 40%  . CARDIAC CATHETERIZATION  02/06/2006   EF 45%. ANTERIOR HYPOKINESIS  . CARDIAC CATHETERIZATION  05/29/2000   EF 50%. MILD ANTERIOR HYPOKINESIS  . CARDIAC CATHETERIZATION  10/25/1999   EF 50%. SEVERE MITRAL REGURGITATION  . CARDIAC CATHETERIZATION  01/06/2003   EF 50%  . CARDIAC DEFIBRILLATOR PLACEMENT    . CATARACT EXTRACTION W/ INTRAOCULAR LENS  IMPLANT, BILATERAL    . CHOLECYSTECTOMY N/A 01/08/2019   Procedure: LAPAROSCOPIC CHOLECYSTECTOMY WITH INTRAOPERATIVE CHOLANGIOGRAM;   Surgeon: Donnie Mesa, MD;  Location: [place];  Service: General;  Laterality: N/A;  . CORONARY ARTERY BYPASS GRAFT  2001   2 VESSEL CABG AND MITRAL VALVE REPAIR. HE HAD A LIMA GRAFT TO THE LAD AND RADIAL ARTERY  GRAFT TO THE OBTUSE MARGINAL  OF LEFT CIRCUMFLEX  . ENDARTERECTOMY Right 11/07/2018   Procedure: ENDARTERECTOMY CAROTID RIGHT;  Surgeon: Waynetta Sandy, MD;  Location: [place];  Service: Vascular;  Laterality: Right;  . EP IMPLANTABLE DEVICE N/A 11/06/2014   Procedure: ICD Generator Changeout;  Surgeon: Deboraha Sprang, MD;  Location: [place] [facility];  Service: Cardiovascular;  Laterality: N/A;  . ERCP N/A 01/10/2019   Procedure: ENDOSCOPIC RETROGRADE CHOLANGIOPANCREATOGRAPHY (ERCP);  Surgeon: Clarene Essex, MD;  Location: [place];  Service: Endoscopy;  Laterality: N/A;  . FOREIGN BODY REMOVAL Right 06/21/2017   Procedure: FOREIGN BODY REMOVAL ADULT RIGHT RING FINGER;  Surgeon: Leanora Cover, MD;  Location: [place];  Service: Orthopedics;  Laterality: Right;  . HEMORRHOID SURGERY    . HEMORROIDECTOMY    . INTRAVASCULAR PRESSURE WIRE/FFR STUDY N/A 01/05/2017   Procedure: INTRAVASCULAR PRESSURE WIRE/FFR STUDY;  Surgeon: Sherren Mocha, MD;  Location: [place] [facility];  Service: Cardiovascular;  Laterality: N/A;  . KNEE ARTHROSCOPY     RIGHT KNEE  . LAPAROSCOPIC APPENDECTOMY N/A 01/08/2019   Procedure: APPENDECTOMY LAPAROSCOPIC;  Surgeon: Donnie Mesa, MD;  Location: [place];  Service: General;  Laterality: N/A;  . LEFT HEART CATHETERIZATION WITH CORONARY ANGIOGRAM N/A 07/04/2012   Procedure: LEFT HEART CATHETERIZATION WITH CORONARY ANGIOGRAM;  Surgeon: Sherren Mocha, MD;  Location: [facility] CATH LAB;  Service: Cardiovascular;  Laterality: N/A;  . MITRAL VALVE REPLACEMENT     No LAA occlusion at the time of surgery  Karie Soda report 2018  . REMOVAL OF STONES  01/10/2019   Procedure: REMOVAL OF STONES;  Surgeon: Clarene Essex, MD;  Location: [place];  Service: Endoscopy;;  .  RIGHT/LEFT HEART CATH AND CORONARY/GRAFT ANGIOGRAPHY N/A 01/05/2017   Procedure: RIGHT/LEFT HEART CATH AND CORONARY/GRAFT ANGIOGRAPHY;  Surgeon: Sherren Mocha, MD;  Location: [place] [facility];  Service: Cardiovascular;  Laterality: N/A;  . SPHINCTEROTOMY  01/10/2019   Procedure: SPHINCTEROTOMY;  Surgeon: Clarene Essex, MD;  Location: [place];  Service: Endoscopy;;  . TRANSTHORACIC ECHOCARDIOGRAM  04/02/2007   EF 35%    Current Outpatient Medications  Medication Sig Dispense Refill  . acetaminophen (TYLENOL) 500 MG tablet Take 2 tablets (1,000 mg total) by mouth every 8 (eight) hours as needed for mild pain, moderate pain or headache. 30 tablet 0  . apixaban (ELIQUIS) 5 MG TABS tablet Take 1 tablet (5 mg total) by mouth 2 (two) times daily. 180 tablet 1  . atorvastatin (LIPITOR) 80 MG tablet Take 1 tablet (80 mg total) by mouth at bedtime. Hold until liver function has normalized.    Marland Kitchen  Cholecalciferol (VITAMIN D-3) 5000 UNITS TABS Take 5,000-10,000 Units by mouth See admin instructions. Take 5000 units by mouth on Sunday, Tuesday, Thursday, and Saturday. Take 10000 units by mouth on Monday, Wednesday, and Friday    . dofetilide ([name]) 250 MCG capsule Take 1 capsule (250 mcg total) by mouth 2 (two) times daily. 60 capsule 0  . EPIPEN 2-PAK 0.3 MG/0.3ML SOAJ injection Inject 0.3 mg into the muscle daily as needed for anaphylaxis. USE ONLY IN EMERGENCY    . fluticasone (FLONASE) 50 MCG/ACT nasal spray Place 1 spray into the nose daily as needed for rhinitis or allergies.    . furosemide (LASIX) 20 MG tablet Take 1 tablet (20 mg total) by mouth 2 (two) times daily for 14 days. 28 tablet 0  . ibuprofen (ADVIL) 200 MG tablet Take 400 mg by mouth every 6 (six) hours as needed for headache.    . Magnesium 250 MG TABS Take 250 mg by mouth daily.     . metoprolol succinate (TOPROL-XL) 50 MG 24 hr tablet Take 1 tablet (50 mg total) by mouth 2 (two) times daily. Take with or immediately following a  meal. 180 tablet 3  . Multiple Vitamin (MULTIVITAMIN WITH MINERALS) TABS Take 1 tablet by mouth daily. Centrum Silver    . nateglinide (STARLIX) 120 MG tablet Take 120 mg by mouth Twice daily.     . pioglitazone (ACTOS) 45 MG tablet Take 45 mg by mouth daily.      . sitaGLIPtin (JANUVIA) 100 MG tablet Take 100 mg by mouth at bedtime.     . Tiotropium Bromide-Olodaterol (STIOLTO RESPIMAT) 2.5-2.5 MCG/ACT AERS Inhale 2 puffs into the lungs daily. 1 Inhaler 5  . triamcinolone cream (KENALOG) 0.1 % Apply 1 application topically 2 (two) times daily as needed (rash).      No current facility-administered medications for this visit.    Allergies  Allergen Reactions  . Bee Venom Anaphylaxis  . Latex Other (See Comments)    REDNESS AT REACTION SITE.  Marland Kitchen Metformin Diarrhea  . Rivaroxaban Other (See Comments)    Headaches, blurred vision  . Sotalol Other (See Comments)    "BLUE TOES"- cut his circulation off  . Warfarin Sodium Other (See Comments)    REACTION: migraine headaches/vision impariment    Review of Systems negative except from HPI and PMH  Physical Exam Well developed and well nourished in no acute distress HENT normal Neck supple with JVP-flat Clear Device pocket well healed; without hematoma or erythema.  There is no tethering  Irregularly irregular rhythm Abd-soft with active BS No Clubbing cyanosis  edema Skin-warm and dry A & Oriented  Grossly normal sensory and motor function  ECG Atrial flutter -/19/50   Assessment and  Plan  Ischemic heart disease with prior bypass ejection 35-40%  Congestive heart failure-chronic-systolic  Implantable defibrillator-Medtronic dual chamber with LV port plugged.   PVCs//Ventricular tachycardia nonsustained     Atrial fibrillation persistent  Dofetilide therapy  COPD-reactive airways disease  With recurrent AFib will have him increase his dofetilide back to 500 mcg Anticipate cardioversion next week  No intercurrent  Ventricular tachycardia  Without symptoms of ischemia  Euvolemic continue current meds however, his OptiVol remains somewhat elevated and he remains a little dyspneic.  We will hope that with restoration of sinus rhythm the situation corrects itself.  Dofetilide surveillance labs in order

## 2019-01-28 NOTE — Patient Instructions (Addendum)
Medication Instructions:  Increase your dofetilide to 500mg  by mouth twice daily  *If you need a refill on your cardiac medications before your next appointment, please call your pharmacy*  Lab Work: BMP and CBC today If you have labs (blood work) drawn today and your tests are completely normal, you will receive your results only by: Marland Kitchen MyChart Message (if you have MyChart) OR . A paper copy in the mail If you have any lab test that is abnormal or we need to change your treatment, we will call you to review the results.  Testing/Procedures: Your physician has recommended that you have a Cardioversion (DCCV). Electrical Cardioversion uses a jolt of electricity to your heart either through paddles or wired patches attached to your chest. This is a controlled, usually prescheduled, procedure. Defibrillation is done under light anesthesia in the hospital, and you usually go home the day of the procedure. This is done to get your heart back into a normal rhythm. You are not awake for the procedure. Please see the instruction sheet given to you today.   Your Pre-procedure COVID-19 Testing will be done on January 5,2021 at 42am at [facility] at [id number], [place], [place] [address]. Once you arrive at the testing site, stay in the right hand lane, go under the building overhang not the tent. If you are tested under the tent your results may not be back before your procedure. Please be on time for your appointment.  After your swab you will be given a mask to wear and instructed to go home and quarantine/no visitors until after your procedure. If you test positive you will be notified and your procedure will be cancelled.     Follow-Up: At [facility], you and your health needs are our priority.  As part of our continuing mission to provide you with exceptional heart care, we have created designated Provider Care Teams.  These Care Teams include your primary  Cardiologist (physician) and Advanced Practice Providers (APPs -  Physician Assistants and Nurse Practitioners) who all work together to provide you with the care you need, when you need it.  Your next appointment:  6 months    You are scheduled for a Cardioversion on February 07, 2019 with Dr. Harrington Challenger.  Please arrive at the [facility] (Main Entrance A) at [facility]: [address] [place],  [address] at 930am am. (1 hour prior to procedure unless lab work is needed; if lab work is needed arrive 1.5 hours ahead)  DIET: Nothing to eat or drink after midnight except a sip of water with medications (see medication instructions below)  Medication Instructions: Hold Lasix Actos Januvia  Starlix  Continue your anticoagulant: Eliquis.  You will take your morning dose of Eliquis. You will need to continue your anticoagulant after your procedure until you are told by your  Provider that it is safe to stop   Labs today CBC and BMP  You must have a responsible person to drive you home and stay in the waiting area during your procedure. Failure to do so could result in cancellation.  Bring your insurance cards.  *Special Note: Every effort is made to have your procedure done on time. Occasionally there are emergencies that occur at the hospital that may cause delays. Please be patient if a delay does occur.

## 2019-01-29 ENCOUNTER — Telehealth: Payer: Self-pay | Admitting: Internal Medicine

## 2019-01-29 DIAGNOSIS — I255 Ischemic cardiomyopathy: Secondary | ICD-10-CM | POA: Diagnosis not present

## 2019-01-29 DIAGNOSIS — M6281 Muscle weakness (generalized): Secondary | ICD-10-CM | POA: Diagnosis not present

## 2019-01-29 DIAGNOSIS — Z48815 Encounter for surgical aftercare following surgery on the digestive system: Secondary | ICD-10-CM | POA: Diagnosis not present

## 2019-01-29 DIAGNOSIS — I5023 Acute on chronic systolic (congestive) heart failure: Secondary | ICD-10-CM | POA: Diagnosis not present

## 2019-01-29 DIAGNOSIS — G453 Amaurosis fugax: Secondary | ICD-10-CM | POA: Diagnosis not present

## 2019-01-29 DIAGNOSIS — I25119 Atherosclerotic heart disease of native coronary artery with unspecified angina pectoris: Secondary | ICD-10-CM | POA: Diagnosis not present

## 2019-01-29 LAB — CBC
Hematocrit: 36 % — ABNORMAL LOW (ref 37.5–51.0)
Hemoglobin: 12.3 g/dL — ABNORMAL LOW (ref 13.0–17.7)
MCH: 33.8 pg — ABNORMAL HIGH (ref 26.6–33.0)
MCHC: 34.2 g/dL (ref 31.5–35.7)
MCV: 99 fL — ABNORMAL HIGH (ref 79–97)
Platelets: 233 10*3/uL (ref 150–450)
RBC: 3.64 x10E6/uL — ABNORMAL LOW (ref 4.14–5.80)
RDW: 13.4 % (ref 11.6–15.4)
WBC: 5 10*3/uL (ref 3.4–10.8)

## 2019-01-29 LAB — BASIC METABOLIC PANEL
BUN/Creatinine Ratio: 15 (ref 10–24)
BUN: 15 mg/dL (ref 8–27)
CO2: 24 mmol/L (ref 20–29)
Calcium: 9.4 mg/dL (ref 8.6–10.2)
Chloride: 97 mmol/L (ref 96–106)
Creatinine, Ser: 1 mg/dL (ref 0.76–1.27)
GFR calc Af Amer: 79 mL/min/{1.73_m2} (ref >59)
GFR calc non Af Amer: 68 mL/min/{1.73_m2} (ref >59)
Glucose: 138 mg/dL — ABNORMAL HIGH (ref 65–99)
Potassium: 4.6 mmol/L (ref 3.5–5.2)
Sodium: 134 mmol/L (ref 134–144)

## 2019-01-29 NOTE — Telephone Encounter (Signed)
Patient had labs drawn yesterday. His endocrinologist reviewed the lab results, and the endocrinologist wanted the patient to add a hepatic panel to the blood panel that was done yesterday if possible. The endocrinologist was interested in the hepatic results.

## 2019-01-29 NOTE — Telephone Encounter (Signed)
Spoke with pt, he reports that his endocrinologist would like him to have a hepatic panel added on to the blood work completed yesterday.  Advised that the doctor who needs the results should place the order.  He is insistent that Dr. Caryl Comes could order the lab for him.  At pt request advised I will send a message to Dr. Caryl Comes to determine if ok to add on the Hepatic panel to pt labs that were completed yesterday.

## 2019-01-29 NOTE — Telephone Encounter (Signed)
Of course!

## 2019-01-30 ENCOUNTER — Other Ambulatory Visit: Payer: Self-pay

## 2019-01-30 DIAGNOSIS — G453 Amaurosis fugax: Secondary | ICD-10-CM | POA: Diagnosis not present

## 2019-01-30 DIAGNOSIS — M6281 Muscle weakness (generalized): Secondary | ICD-10-CM | POA: Diagnosis not present

## 2019-01-30 DIAGNOSIS — I5023 Acute on chronic systolic (congestive) heart failure: Secondary | ICD-10-CM | POA: Diagnosis not present

## 2019-01-30 DIAGNOSIS — Z48815 Encounter for surgical aftercare following surgery on the digestive system: Secondary | ICD-10-CM | POA: Diagnosis not present

## 2019-01-30 DIAGNOSIS — K859 Acute pancreatitis without necrosis or infection, unspecified: Secondary | ICD-10-CM

## 2019-01-30 DIAGNOSIS — I25119 Atherosclerotic heart disease of native coronary artery with unspecified angina pectoris: Secondary | ICD-10-CM | POA: Diagnosis not present

## 2019-01-30 DIAGNOSIS — I255 Ischemic cardiomyopathy: Secondary | ICD-10-CM | POA: Diagnosis not present

## 2019-01-30 NOTE — Telephone Encounter (Signed)
Lab order entered, add-on form completed.  Pt notified via [employer]

## 2019-02-03 NOTE — Addendum Note (Signed)
Addended by: Rose Phi on: 02/03/2019 04:42 PM   Modules accepted: Orders

## 2019-02-04 ENCOUNTER — Other Ambulatory Visit (HOSPITAL_COMMUNITY): Payer: Medicare Other

## 2019-02-05 ENCOUNTER — Other Ambulatory Visit (HOSPITAL_COMMUNITY)
Admission: RE | Admit: 2019-02-05 | Discharge: 2019-02-05 | Disposition: A | Discharge status: Home / Self Care | Payer: Medicare Other | Source: Ambulatory Visit | Attending: Internal Medicine | Admitting: Internal Medicine

## 2019-02-05 DIAGNOSIS — Z20822 Contact with and (suspected) exposure to covid-19: Secondary | ICD-10-CM | POA: Diagnosis not present

## 2019-02-05 DIAGNOSIS — M6281 Muscle weakness (generalized): Secondary | ICD-10-CM | POA: Diagnosis not present

## 2019-02-05 DIAGNOSIS — I5023 Acute on chronic systolic (congestive) heart failure: Secondary | ICD-10-CM | POA: Diagnosis not present

## 2019-02-05 DIAGNOSIS — I255 Ischemic cardiomyopathy: Secondary | ICD-10-CM | POA: Diagnosis not present

## 2019-02-05 DIAGNOSIS — I25119 Atherosclerotic heart disease of native coronary artery with unspecified angina pectoris: Secondary | ICD-10-CM | POA: Diagnosis not present

## 2019-02-05 DIAGNOSIS — Z48815 Encounter for surgical aftercare following surgery on the digestive system: Secondary | ICD-10-CM | POA: Diagnosis not present

## 2019-02-05 DIAGNOSIS — G453 Amaurosis fugax: Secondary | ICD-10-CM | POA: Diagnosis not present

## 2019-02-05 DIAGNOSIS — Z01812 Encounter for preprocedural laboratory examination: Secondary | ICD-10-CM | POA: Insufficient documentation

## 2019-02-05 LAB — SARS CORONAVIRUS 2 (TAT 6-24 HRS): SARS Coronavirus 2: NEGATIVE

## 2019-02-06 DIAGNOSIS — Z48815 Encounter for surgical aftercare following surgery on the digestive system: Secondary | ICD-10-CM | POA: Diagnosis not present

## 2019-02-06 DIAGNOSIS — I25119 Atherosclerotic heart disease of native coronary artery with unspecified angina pectoris: Secondary | ICD-10-CM | POA: Diagnosis not present

## 2019-02-06 DIAGNOSIS — M6281 Muscle weakness (generalized): Secondary | ICD-10-CM | POA: Diagnosis not present

## 2019-02-06 DIAGNOSIS — I5023 Acute on chronic systolic (congestive) heart failure: Secondary | ICD-10-CM | POA: Diagnosis not present

## 2019-02-06 DIAGNOSIS — I255 Ischemic cardiomyopathy: Secondary | ICD-10-CM | POA: Diagnosis not present

## 2019-02-06 DIAGNOSIS — G453 Amaurosis fugax: Secondary | ICD-10-CM | POA: Diagnosis not present

## 2019-02-07 ENCOUNTER — Ambulatory Visit (HOSPITAL_COMMUNITY): Payer: Medicare Other | Admitting: Anesthesiology

## 2019-02-07 ENCOUNTER — Encounter (HOSPITAL_COMMUNITY): Payer: Self-pay | Admitting: Internal Medicine

## 2019-02-07 ENCOUNTER — Ambulatory Visit (INDEPENDENT_AMBULATORY_CARE_PROVIDER_SITE_OTHER): Payer: Medicare Other | Admitting: *Deleted

## 2019-02-07 ENCOUNTER — Ambulatory Visit (HOSPITAL_COMMUNITY)
Admission: RE | Admit: 2019-02-07 | Discharge: 2019-02-07 | Disposition: A | Discharge status: Home / Self Care | Payer: Medicare Other | Attending: Internal Medicine | Admitting: Internal Medicine

## 2019-02-07 ENCOUNTER — Encounter (HOSPITAL_COMMUNITY)
Admission: RE | Disposition: A | Discharge status: Home / Self Care | Payer: Self-pay | Source: Home / Self Care | Attending: Internal Medicine

## 2019-02-07 ENCOUNTER — Other Ambulatory Visit: Payer: Self-pay

## 2019-02-07 DIAGNOSIS — Z95810 Presence of automatic (implantable) cardiac defibrillator: Secondary | ICD-10-CM | POA: Diagnosis not present

## 2019-02-07 DIAGNOSIS — Z7901 Long term (current) use of anticoagulants: Secondary | ICD-10-CM | POA: Diagnosis not present

## 2019-02-07 DIAGNOSIS — Z79899 Other long term (current) drug therapy: Secondary | ICD-10-CM | POA: Insufficient documentation

## 2019-02-07 DIAGNOSIS — J449 Chronic obstructive pulmonary disease, unspecified: Secondary | ICD-10-CM | POA: Insufficient documentation

## 2019-02-07 DIAGNOSIS — I5042 Chronic combined systolic (congestive) and diastolic (congestive) heart failure: Secondary | ICD-10-CM | POA: Diagnosis not present

## 2019-02-07 DIAGNOSIS — I509 Heart failure, unspecified: Secondary | ICD-10-CM

## 2019-02-07 DIAGNOSIS — Z7984 Long term (current) use of oral hypoglycemic drugs: Secondary | ICD-10-CM | POA: Diagnosis not present

## 2019-02-07 DIAGNOSIS — I4819 Other persistent atrial fibrillation: Secondary | ICD-10-CM | POA: Diagnosis not present

## 2019-02-07 DIAGNOSIS — E119 Type 2 diabetes mellitus without complications: Secondary | ICD-10-CM | POA: Diagnosis not present

## 2019-02-07 DIAGNOSIS — I2510 Atherosclerotic heart disease of native coronary artery without angina pectoris: Secondary | ICD-10-CM | POA: Diagnosis not present

## 2019-02-07 DIAGNOSIS — I4891 Unspecified atrial fibrillation: Secondary | ICD-10-CM

## 2019-02-07 DIAGNOSIS — I5043 Acute on chronic combined systolic (congestive) and diastolic (congestive) heart failure: Secondary | ICD-10-CM | POA: Diagnosis not present

## 2019-02-07 HISTORY — PX: CARDIOVERSION: SHX1299

## 2019-02-07 LAB — POCT I-STAT, CHEM 8
BUN: 12 mg/dL (ref 8–23)
Calcium, Ion: 1.2 mmol/L (ref 1.15–1.40)
Chloride: 102 mmol/L (ref 98–111)
Creatinine, Ser: 0.9 mg/dL (ref 0.61–1.24)
Glucose, Bld: 178 mg/dL — ABNORMAL HIGH (ref 70–99)
HCT: 40 % (ref 39.0–52.0)
Hemoglobin: 13.6 g/dL (ref 13.0–17.0)
Potassium: 4.3 mmol/L (ref 3.5–5.1)
Sodium: 136 mmol/L (ref 135–145)
TCO2: 25 mmol/L (ref 22–32)

## 2019-02-07 SURGERY — CARDIOVERSION
Anesthesia: General

## 2019-02-07 MED ORDER — SODIUM CHLORIDE 0.9 % IV SOLN: INTRAVENOUS | Status: DC | PRN

## 2019-02-07 MED ORDER — LIDOCAINE HCL (CARDIAC) PF 100 MG/5ML IV SOSY
PREFILLED_SYRINGE | INTRAVENOUS | Status: DC | PRN
  Administered 2019-02-07: 60 mg via INTRATRACHEAL

## 2019-02-07 MED ORDER — PROPOFOL 10 MG/ML IV BOLUS
INTRAVENOUS | Status: DC | PRN
  Administered 2019-02-07: 20 mg via INTRAVENOUS
  Administered 2019-02-07: 40 mg via INTRAVENOUS

## 2019-02-07 MED ORDER — SODIUM CHLORIDE 0.9 % IV SOLN
INTRAVENOUS | Status: AC | PRN
  Administered 2019-02-07: 500 mL via INTRAVENOUS

## 2019-02-07 NOTE — Anesthesia Preprocedure Evaluation (Signed)
Anesthesia Evaluation  Patient identified by MRN, date of birth, ID band Patient awake    Reviewed: Allergy & Precautions, NPO status , Patient's Chart, lab work & pertinent test results  Airway Mallampati: II  TM Distance: >3 FB Neck ROM: Full    Dental  (+) Teeth Intact, Dental Advisory Given   Pulmonary asthma , COPD, former smoker,    Pulmonary exam normal breath sounds clear to auscultation       Cardiovascular + angina + CAD, + CABG, + Peripheral Vascular Disease and +CHF  + dysrhythmias Atrial Fibrillation + Cardiac Defibrillator  Rhythm:Irregular Rate:Abnormal     Neuro/Psych  Headaches, TIA   GI/Hepatic negative GI ROS, Neg liver ROS,   Endo/Other  diabetes, Type 2, Oral Hypoglycemic Agents  Renal/GU negative Renal ROS     Musculoskeletal negative musculoskeletal ROS (+)   Abdominal   Peds  Hematology  (+) Blood dyscrasia (Eliquis), ,   Anesthesia Other Findings Day of surgery medications reviewed with the patient.  Reproductive/Obstetrics                             Anesthesia Physical Anesthesia Plan  ASA: III  Anesthesia Plan: General   Post-op Pain Management:    Induction: Intravenous  PONV Risk Score and Plan: 2 and Treatment may vary due to age or medical condition  Airway Management Planned: Mask  Additional Equipment:   Intra-op Plan:   Post-operative Plan:   Informed Consent: I have reviewed the patients History and Physical, chart, labs and discussed the procedure including the risks, benefits and alternatives for the proposed anesthesia with the patient or authorized representative who has indicated his/her understanding and acceptance.     Dental advisory given  Plan Discussed with: CRNA  Anesthesia Plan Comments:         Anesthesia Quick Evaluation

## 2019-02-07 NOTE — CV Procedure (Signed)
CARDIOVERSION  Patient sedated by anesthesia with propofol and lidocaine intravenously WIth pads in AP position, patient cardioverted to SR with 200 J synchronized biphasic energy  Device rep present to interrogate device  Procedure was without complication  Dorris Carnes MD

## 2019-02-07 NOTE — Anesthesia Postprocedure Evaluation (Signed)
Anesthesia Post Note  Patient: NOEH DUFFEK  Procedure(s) Performed: CARDIOVERSION (N/A )     Patient location during evaluation: Endoscopy Anesthesia Type: General Level of consciousness: awake and alert Pain management: pain level controlled Vital Signs Assessment: post-procedure vital signs reviewed and stable Respiratory status: spontaneous breathing, nonlabored ventilation and respiratory function stable Cardiovascular status: blood pressure returned to baseline and stable Postop Assessment: no apparent nausea or vomiting Anesthetic complications: no    Last Vitals:  Vitals:   02/07/19 0935 02/07/19 0945  BP: 111/64 111/67  Pulse: 60 63  Resp: 20 20  Temp:    SpO2: 100% 99%    Last Pain:  Vitals:   02/07/19 0945  TempSrc:   PainSc: 0-No pain                 Catalina Gravel

## 2019-02-07 NOTE — Discharge Instructions (Signed)
Electrical Cardioversion Electrical cardioversion is the delivery of a jolt of electricity to restore a normal rhythm to the heart. A rhythm that is too fast or is not regular keeps the heart from pumping well. In this procedure, sticky patches or metal paddles are placed on the chest to deliver electricity to the heart from a device. This procedure may be done in an emergency if:  There is low or no blood pressure as a result of the heart rhythm.  Normal rhythm must be restored as fast as possible to protect the brain and heart from further damage.  It may save a life. This may also be a scheduled procedure for irregular or fast heart rhythms that are not immediately life-threatening. Follow these instructions at home:  Do not drive for 24 hours if you were given a sedative during your procedure.  Take over-the-counter and prescription medicines only as told by your health care provider.  Ask your health care provider how to check your pulse. Check it often.  Rest for 48 hours after the procedure or as told by your health care provider.  Avoid or limit your caffeine use as told by your health care provider.  Keep all follow-up visits as told by your health care provider. This is important. Contact a health care provider if:  You feel like your heart is beating too quickly or your pulse is not regular.  You have a serious muscle cramp that does not go away. Get help right away if:  You have discomfort in your chest.  You are dizzy or you feel faint.  You have trouble breathing or you are short of breath.  Your speech is slurred.  You have trouble moving an arm or leg on one side of your body.  Your fingers or toes turn cold or blue. Summary  Electrical cardioversion is the delivery of a jolt of electricity to restore a normal rhythm to the heart.  This procedure may be done right away in an emergency or may be a scheduled procedure if the condition is not an  emergency.  Generally, this is a safe procedure.  After the procedure, check your pulse often as told by your health care provider. This information is not intended to replace advice given to you by your health care provider. Make sure you discuss any questions you have with your health care provider. Document Revised: 08/19/2018 Document Reviewed: 08/19/2018 Elsevier Patient Education  2020 [employer]  

## 2019-02-07 NOTE — Transfer of Care (Signed)
Immediate Anesthesia Transfer of Care Note  Patient: [name]  Procedure(s) Performed: CARDIOVERSION (N/A )  Patient Location: Endoscopy Unit  Anesthesia Type:General  Level of Consciousness: awake, alert  and oriented  Airway & Oxygen Therapy: Patient Spontanous Breathing and Patient connected to nasal cannula oxygen  Post-op Assessment: Report given to RN, Post -op Vital signs reviewed and stable and Patient moving all extremities X 4  Post vital signs: Reviewed and stable  Last Vitals:  Vitals Value Taken Time  BP    Temp    Pulse    Resp    SpO2      Last Pain:  Vitals:   02/07/19 0743  TempSrc: Temporal  PainSc: 0-No pain         Complications: No apparent anesthesia complications

## 2019-02-10 DIAGNOSIS — I472 Ventricular tachycardia: Secondary | ICD-10-CM | POA: Diagnosis not present

## 2019-02-10 DIAGNOSIS — G459 Transient cerebral ischemic attack, unspecified: Secondary | ICD-10-CM | POA: Diagnosis not present

## 2019-02-10 DIAGNOSIS — I5042 Chronic combined systolic (congestive) and diastolic (congestive) heart failure: Secondary | ICD-10-CM | POA: Diagnosis not present

## 2019-02-10 DIAGNOSIS — E1169 Type 2 diabetes mellitus with other specified complication: Secondary | ICD-10-CM | POA: Diagnosis not present

## 2019-02-10 DIAGNOSIS — I7300 Raynaud's syndrome without gangrene: Secondary | ICD-10-CM | POA: Diagnosis not present

## 2019-02-10 DIAGNOSIS — I255 Ischemic cardiomyopathy: Secondary | ICD-10-CM | POA: Diagnosis not present

## 2019-02-10 DIAGNOSIS — Z952 Presence of prosthetic heart valve: Secondary | ICD-10-CM | POA: Diagnosis not present

## 2019-02-10 DIAGNOSIS — E785 Hyperlipidemia, unspecified: Secondary | ICD-10-CM | POA: Diagnosis not present

## 2019-02-10 DIAGNOSIS — R7989 Other specified abnormal findings of blood chemistry: Secondary | ICD-10-CM | POA: Diagnosis not present

## 2019-02-10 DIAGNOSIS — Z8639 Personal history of other endocrine, nutritional and metabolic disease: Secondary | ICD-10-CM | POA: Diagnosis not present

## 2019-02-10 DIAGNOSIS — J449 Chronic obstructive pulmonary disease, unspecified: Secondary | ICD-10-CM | POA: Diagnosis not present

## 2019-02-10 LAB — CUP PACEART REMOTE DEVICE CHECK
Battery Remaining Longevity: 27 mo
Battery Voltage: 2.94 V
Brady Statistic AP VP Percent: 4.34 %
Brady Statistic AP VS Percent: 0.42 %
Brady Statistic AS VP Percent: 64.68 %
Brady Statistic AS VS Percent: 30.57 %
Brady Statistic RA Percent Paced: 4.38 %
Brady Statistic RV Percent Paced: 70.04 %
Date Time Interrogation Session: 20210108043824
HighPow Impedance: 38 Ohm
HighPow Impedance: 50 Ohm
Implantable Lead Implant Date: 20040309
Implantable Lead Implant Date: 20040309
Implantable Lead Location: 753859
Implantable Lead Location: 753860
Implantable Lead Model: 158
Implantable Lead Model: 5076
Implantable Lead Serial Number: 115061
Implantable Pulse Generator Implant Date: 20161007
Lead Channel Impedance Value: 399 Ohm
Lead Channel Impedance Value: 4047 Ohm
Lead Channel Impedance Value: 4047 Ohm
Lead Channel Impedance Value: 4047 Ohm
Lead Channel Impedance Value: 4047 Ohm
Lead Channel Impedance Value: 4047 Ohm
Lead Channel Impedance Value: 4047 Ohm
Lead Channel Impedance Value: 4047 Ohm
Lead Channel Impedance Value: 4047 Ohm
Lead Channel Impedance Value: 4047 Ohm
Lead Channel Impedance Value: 4047 Ohm
Lead Channel Impedance Value: 456 Ohm
Lead Channel Impedance Value: 475 Ohm
Lead Channel Pacing Threshold Amplitude: 0.5 V
Lead Channel Pacing Threshold Amplitude: 0.625 V
Lead Channel Pacing Threshold Pulse Width: 0.4 ms
Lead Channel Pacing Threshold Pulse Width: 0.4 ms
Lead Channel Sensing Intrinsic Amplitude: 0.875 mV
Lead Channel Sensing Intrinsic Amplitude: 0.875 mV
Lead Channel Sensing Intrinsic Amplitude: 6.5 mV
Lead Channel Sensing Intrinsic Amplitude: 6.5 mV
Lead Channel Setting Pacing Amplitude: 2 V
Lead Channel Setting Pacing Amplitude: 2.5 V
Lead Channel Setting Pacing Pulse Width: 0.4 ms
Lead Channel Setting Sensing Sensitivity: 0.3 mV

## 2019-02-10 NOTE — H&P (Signed)
History and physical reviewed from Office Visit of 01/29/19 with Jolyn Nap.  No significant changes. Pt is acceptable candidate for cardioversion. Plan to proceed.  Dorris Carnes MD

## 2019-02-14 DIAGNOSIS — I255 Ischemic cardiomyopathy: Secondary | ICD-10-CM | POA: Diagnosis not present

## 2019-02-14 DIAGNOSIS — I5023 Acute on chronic systolic (congestive) heart failure: Secondary | ICD-10-CM | POA: Diagnosis not present

## 2019-02-14 DIAGNOSIS — Z48815 Encounter for surgical aftercare following surgery on the digestive system: Secondary | ICD-10-CM | POA: Diagnosis not present

## 2019-02-14 DIAGNOSIS — I25119 Atherosclerotic heart disease of native coronary artery with unspecified angina pectoris: Secondary | ICD-10-CM | POA: Diagnosis not present

## 2019-02-14 DIAGNOSIS — M6281 Muscle weakness (generalized): Secondary | ICD-10-CM | POA: Diagnosis not present

## 2019-02-14 DIAGNOSIS — G453 Amaurosis fugax: Secondary | ICD-10-CM | POA: Diagnosis not present

## 2019-02-26 DIAGNOSIS — L509 Urticaria, unspecified: Secondary | ICD-10-CM | POA: Diagnosis not present

## 2019-02-26 DIAGNOSIS — Z85828 Personal history of other malignant neoplasm of skin: Secondary | ICD-10-CM | POA: Diagnosis not present

## 2019-02-27 ENCOUNTER — Telehealth (HOSPITAL_COMMUNITY): Payer: Self-pay

## 2019-02-27 DIAGNOSIS — E782 Mixed hyperlipidemia: Secondary | ICD-10-CM | POA: Diagnosis not present

## 2019-02-27 DIAGNOSIS — E1159 Type 2 diabetes mellitus with other circulatory complications: Secondary | ICD-10-CM | POA: Diagnosis not present

## 2019-02-27 DIAGNOSIS — E559 Vitamin D deficiency, unspecified: Secondary | ICD-10-CM | POA: Diagnosis not present

## 2019-02-27 NOTE — Telephone Encounter (Signed)

## 2019-02-28 ENCOUNTER — Ambulatory Visit (INDEPENDENT_AMBULATORY_CARE_PROVIDER_SITE_OTHER): Payer: Medicare Other | Admitting: Vascular Surgery

## 2019-02-28 ENCOUNTER — Encounter: Payer: Self-pay | Admitting: Vascular Surgery

## 2019-02-28 ENCOUNTER — Ambulatory Visit: Payer: Medicare Other

## 2019-02-28 ENCOUNTER — Other Ambulatory Visit: Payer: Self-pay

## 2019-02-28 VITALS — BP 118/74 | HR 71 | Temp 98.0°F | Resp 20 | Ht 73.0 in | Wt 200.0 lb

## 2019-02-28 DIAGNOSIS — I714 Abdominal aortic aneurysm, without rupture, unspecified: Secondary | ICD-10-CM

## 2019-02-28 NOTE — Progress Notes (Signed)
Patient ID: [name], male   DOB: 17-Jul-1931, 86 y.o.   MRN: [id number]  Reason for Consult: Follow-up   Referred by [name], MD  Subjective:     HPI:  [name] is a 86 y.o. male with known history of abdominal aortic aneurysm.  He was recently admitted with pancreatitis underwent cholecystectomy and appendectomy.  Also has a history of coronary artery bypass and mitral valve repair at the [facility] clinic in the early 2000's.  Prior to recent CT scan did not know if history of AAA.  Also underwent right carotid endarterectomy by myself for symptomatic disease.  He is healed well from this.  Was previously taken Eliquis no longer on blood thinners.  Does not have new abdominal or back pain.  Past Medical History:  Diagnosis Date  . Acute on chronic combined systolic (congestive) and diastolic (congestive) heart failure ([place])   . AICD (automatic cardioverter/defibrillator) present    [employer]n PPM/ICD  . Carotid stenosis, right    s/p endarterectomy 11/07/18  . Chronic combined systolic (congestive) and diastolic (congestive) heart failure ([place])   . Coronary artery disease    [facility]  [facility]  . Diabetes mellitus   . Dysrhythmia   . Endocarditis, valve unspecified    Mitral  . Headache    hx migraines 6-7 x mo  . History of migraine headaches   . History of seasonal allergies   . ICD (implantable cardiac defibrillator) in place   . PAF (paroxysmal atrial fibrillation) ([place])    NO LAA occlusion at the time of surgery  [name] 2018  . Pleural effusion   . PVC's (premature ventricular contractions)   . Ventricular tachycardia, polymorphic (H[facility])    status post ICD implantation   Family History  Problem Relation Age of Onset  . Heart disease Father   . Emphysema Father   . Stroke Mother   . Cancer Sister        breast cancer  . Heart disease Paternal Grandmother   . Heart disease Paternal Grandfather   . Diabetes Other         grandmother, aunt, uncle  . Cancer Other        lung cancer, aunt   Past Surgical History:  Procedure Laterality Date  . CARDIAC CATHETERIZATION  04/03/2007   EF 40%  . CARDIAC CATHETERIZATION  02/06/2006   EF 45%. ANTERIOR HYPOKINESIS  . CARDIAC CATHETERIZATION  05/29/2000   EF 50%. MILD ANTERIOR HYPOKINESIS  . CARDIAC CATHETERIZATION  10/25/1999   EF 50%. SEVERE MITRAL REGURGITATION  . CARDIAC CATHETERIZATION  01/06/2003   EF 50%  . CARDIAC DEFIBRILLATOR PLACEMENT    . CARDIOVERSION N/A 02/07/2019   Procedure: CARDIOVERSION;  Surgeon: Fay Records, MD;  Location: [place];  Service: Cardiovascular;  Laterality: N/A;  . CATARACT EXTRACTION W/ INTRAOCULAR LENS  IMPLANT, BILATERAL    . CHOLECYSTECTOMY N/A 01/08/2019   Procedure: LAPAROSCOPIC CHOLECYSTECTOMY WITH INTRAOPERATIVE CHOLANGIOGRAM;  Surgeon: Donnie Mesa, MD;  Location: [place];  Service: General;  Laterality: N/A;  . CORONARY ARTERY BYPASS GRAFT  2001   2 VESSEL CABG AND MITRAL VALVE REPAIR. HE HAD A LIMA GRAFT TO THE LAD AND RADIAL ARTERY  GRAFT TO THE OBTUSE MARGINAL  OF LEFT CIRCUMFLEX  . ENDARTERECTOMY Right 11/07/2018   Procedure: ENDARTERECTOMY CAROTID RIGHT;  Surgeon: Waynetta Sandy, MD;  Location: [place];  Service: Vascular;  Laterality: Right;  . EP IMPLANTABLE DEVICE N/A 11/06/2014   Procedure: ICD  Nature conservation officer;  Surgeon: Deboraha Sprang, MD;  Location: [place] CV LAB;  Service: Cardiovascular;  Laterality: N/A;  . ERCP N/A 01/10/2019   Procedure: ENDOSCOPIC RETROGRADE CHOLANGIOPANCREATOGRAPHY (ERCP);  Surgeon: Clarene Essex, MD;  Location: [place];  Service: Endoscopy;  Laterality: N/A;  . FOREIGN BODY REMOVAL Right 06/21/2017   Procedure: FOREIGN BODY REMOVAL ADULT RIGHT RING FINGER;  Surgeon: Leanora Cover, MD;  Location: [place];  Service: Orthopedics;  Laterality: Right;  . HEMORRHOID SURGERY    . HEMORROIDECTOMY    . INTRAVASCULAR PRESSURE WIRE/FFR STUDY N/A 01/05/2017   Procedure:  INTRAVASCULAR PRESSURE WIRE/FFR STUDY;  Surgeon: Sherren Mocha, MD;  Location: [place] CV [facility];  Service: Cardiovascular;  Laterality: N/A;  . KNEE ARTHROSCOPY     RIGHT KNEE  . LAPAROSCOPIC APPENDECTOMY N/A 01/08/2019   Procedure: APPENDECTOMY LAPAROSCOPIC;  Surgeon: Donnie Mesa, MD;  Location: [place];  Service: General;  Laterality: N/A;  . LEFT HEART CATHETERIZATION WITH CORONARY ANGIOGRAM N/A 07/04/2012   Procedure: LEFT HEART CATHETERIZATION WITH CORONARY ANGIOGRAM;  Surgeon: Sherren Mocha, MD;  Location: [facility] CATH LAB;  Service: Cardiovascular;  Laterality: N/A;  . MITRAL VALVE REPLACEMENT     No LAA occlusion at the time of surgery  Karie Soda report 2018  . REMOVAL OF STONES  01/10/2019   Procedure: REMOVAL OF STONES;  Surgeon: Clarene Essex, MD;  Location: [place];  Service: Endoscopy;;  . RIGHT/LEFT HEART CATH AND CORONARY/GRAFT ANGIOGRAPHY N/A 01/05/2017   Procedure: RIGHT/LEFT HEART CATH AND CORONARY/GRAFT ANGIOGRAPHY;  Surgeon: Sherren Mocha, MD;  Location: [place] CV [facility];  Service: Cardiovascular;  Laterality: N/A;  . SPHINCTEROTOMY  01/10/2019   Procedure: SPHINCTEROTOMY;  Surgeon: Clarene Essex, MD;  Location: [facility] ENDOSCOPY;  Service: Endoscopy;;  . TRANSTHORACIC ECHOCARDIOGRAM  04/02/2007   EF 35%    Short Social History:  Social History   Tobacco Use  . Smoking status: Former Smoker    Packs/day: 2.00    Years: 16.00    Pack years: 32.00    Types: Cigarettes    Start date: 02/23/1956    Quit date: 01/31/1971    Years since quitting: 48.1  . Smokeless tobacco: Never Used  Substance Use Topics  . Alcohol use: Yes    Alcohol/week: 0.0 standard drinks    Comment: 1-2 a week    Allergies  Allergen Reactions  . Bee Venom Anaphylaxis  . Latex Other (See Comments)    REDNESS AT REACTION SITE.  Marland Kitchen Metformin Diarrhea  . Rivaroxaban Other (See Comments)    Headaches, blurred vision  . Sotalol Other (See Comments)    "BLUE TOES"- cut his circulation off  .  Warfarin Sodium Other (See Comments)    REACTION: migraine headaches/vision impariment    Current Outpatient Medications  Medication Sig Dispense Refill  . acetaminophen (TYLENOL) 500 MG tablet Take 2 tablets (1,000 mg total) by mouth every 8 (eight) hours as needed for mild pain, moderate pain or headache. 30 tablet 0  . Cholecalciferol (VITAMIN D) 125 MCG (5000 UT) CAPS Take 5,000-10,000 Units by mouth See admin instructions. Take 10000 units by mouth on Monday, Wednesday and Friday and take 5000 units on Tuesday, Thursday, Saturday and Sunday    . dofetilide ([name]) 500 MCG capsule Take 1 capsule (500 mcg total) by mouth 2 (two) times daily. 180 capsule 3  . EPIPEN 2-PAK 0.3 MG/0.3ML SOAJ injection Inject 0.3 mg into the muscle daily as needed for anaphylaxis.     . fluticasone (FLONASE) 50 MCG/ACT nasal  spray Place 1 spray into the nose daily as needed for rhinitis or allergies.    . Magnesium 250 MG TABS Take 250 mg by mouth daily.     . metoprolol succinate (TOPROL-XL) 50 MG 24 hr tablet Take 1 tablet (50 mg total) by mouth 2 (two) times daily. Take with or immediately following a meal. 180 tablet 3  . Multiple Vitamin (MULTIVITAMIN WITH MINERALS) TABS Take 1 tablet by mouth daily. Centrum Silver    . nateglinide (STARLIX) 120 MG tablet Take 120 mg by mouth 2 (two) times daily before a meal.     . pioglitazone (ACTOS) 45 MG tablet Take 45 mg by mouth daily.      . sitaGLIPtin (JANUVIA) 100 MG tablet Take 100 mg by mouth at bedtime.     . Tiotropium Bromide-Olodaterol (STIOLTO RESPIMAT) 2.5-2.5 MCG/ACT AERS Inhale 2 puffs into the lungs daily. 1 Inhaler 5  . apixaban (ELIQUIS) 5 MG TABS tablet Take 1 tablet (5 mg total) by mouth 2 (two) times daily. (Patient not taking: Reported on 02/28/2019) 180 tablet 1  . atorvastatin (LIPITOR) 80 MG tablet Take 1 tablet (80 mg total) by mouth at bedtime. Hold until liver function has normalized. (Patient not taking: Reported on 02/28/2019)    .  furosemide (LASIX) 20 MG tablet Take 1 tablet (20 mg total) by mouth 2 (two) times daily for 14 days. (Patient not taking: Reported on 01/28/2019) 28 tablet 0  . triamcinolone cream (KENALOG) 0.1 % Apply 1 application topically 2 (two) times daily as needed (rash).      No current facility-administered medications for this visit.    Review of Systems  Constitutional:  Constitutional negative. HENT: HENT negative.  Eyes: Eyes negative.  Cardiovascular: Cardiovascular negative.  GI: Gastrointestinal negative.  Musculoskeletal: Musculoskeletal negative.  Skin: Skin negative.  Neurological: Neurological negative. Hematologic: Hematologic/lymphatic negative.  Psychiatric: Psychiatric negative.        Objective:  Objective   Vitals:   02/28/19 1108  BP: 118/74  Pulse: 71  Resp: 20  Temp: 98 F (36.7 C)  SpO2: 98%  Weight: 200 lb (90.7 kg)  Height: 6\' 1"  (1.854 m)   Body mass index is 26.39 kg/m.  Physical Exam Constitutional:      Appearance: Normal appearance.  HENT:     Head: Normocephalic.     Nose:     Comments: Mask in place Eyes:     Pupils: Pupils are equal, round, and reactive to light.  Cardiovascular:     Rate and Rhythm: Normal rate.     Pulses:          Radial pulses are 0 on the right side and 2+ on the left side.       Femoral pulses are 2+ on the right side and 2+ on the left side.      Popliteal pulses are 2+ on the right side and 2+ on the left side.     Comments: Well-healed right neck incision Pulmonary:     Effort: Pulmonary effort is normal.  Abdominal:     Palpations: Abdomen is soft. There is no mass.  Musculoskeletal:        General: No swelling. Normal range of motion.     Cervical back: Normal range of motion and neck supple.  Skin:    General: Skin is warm and dry.     Capillary Refill: Capillary refill takes less than 2 seconds.  Neurological:     General: No focal deficit present.  Mental Status: He is alert.  Psychiatric:         Mood and Affect: Mood normal.        Behavior: Behavior normal.        Thought Content: Thought content normal.        Judgment: Judgment normal.     Data: CT IMPRESSION: 1. Peripancreatic edema/inflammation, similar to prior. Acute pancreatitis could have this appearance. There is no dilatation of the main pancreatic duct. No findings to suggest pancreatic necrosis. 2. No focal or rim enhancing fluid collection in the abdomen/pelvis. 3. Distended gallbladder with calcified gallstones. 4. Intra and extrahepatic biliary duct dilatation with extrahepatic common duct measuring up to 13 mm. No calcified stone visualized within the extrahepatic bile ducts on today's study. MRCP or ERCP may prove helpful to further evaluate as clinically warranted. 5. Since the prior study, the appendix is become more distended and fluid-filled. Appendiceal diameter is abnormal today at 13 mm. Appendiceal lumen is diffusely air-filled on the study from 3 days ago. Features on today's exam may be secondary to irritation from the intraperitoneal fluid and adjacent extraperitoneal inflammatory change. Acute appendicitis cannot be definitively excluded by imaging on this study. 6. Small volume intraperitoneal free fluid. 7. Small bilateral groin hernias contain only fat. 8. Abdominal aortic aneurysm. Vascular surgery consultation recommended due to increased risk of rupture for AAA >5.5 cm. This recommendation follows ACR consensus guidelines: White Paper of the ACR Incidental Findings Committee II on Vascular Findings. J Am Coll Radiol 2013; 10:789-794. Aortic aneurysm NOS (ICD10-I71.9)      Assessment/Plan:     86 year old male here for evaluation abdominal aortic aneurysm found incidentally on CT scan for pancreatitis.  Aneurysm measured 5.7 cm at this time.  I have measured he appears to be candidate for endovascular repair.  I discussed the risk benefits and alternatives with the patient as  well as options for repair.  We reviewed his CT scan together today in the office.  His wife is having back surgery next week he would like to delay this until early March.  I discussed with him that that is certainly okay but if it needs to be delayed beyond that I would request repeat CT scanning prior to proceeding with repair given that previous CT scan performed in early December.  He demonstrates good understanding of this we will get him scheduled today.  We will also have his cardiologist give clearance prior to surgery.     Waynetta Sandy MD [facility]

## 2019-03-04 ENCOUNTER — Other Ambulatory Visit: Payer: Self-pay

## 2019-03-04 DIAGNOSIS — I714 Abdominal aortic aneurysm, without rupture, unspecified: Secondary | ICD-10-CM

## 2019-03-05 DIAGNOSIS — Z8679 Personal history of other diseases of the circulatory system: Secondary | ICD-10-CM | POA: Diagnosis not present

## 2019-03-05 DIAGNOSIS — E113393 Type 2 diabetes mellitus with moderate nonproliferative diabetic retinopathy without macular edema, bilateral: Secondary | ICD-10-CM | POA: Diagnosis not present

## 2019-03-05 DIAGNOSIS — E782 Mixed hyperlipidemia: Secondary | ICD-10-CM | POA: Diagnosis not present

## 2019-03-05 DIAGNOSIS — I480 Paroxysmal atrial fibrillation: Secondary | ICD-10-CM | POA: Diagnosis not present

## 2019-03-05 DIAGNOSIS — E1159 Type 2 diabetes mellitus with other circulatory complications: Secondary | ICD-10-CM | POA: Diagnosis not present

## 2019-03-05 DIAGNOSIS — E559 Vitamin D deficiency, unspecified: Secondary | ICD-10-CM | POA: Diagnosis not present

## 2019-03-05 DIAGNOSIS — Z95810 Presence of automatic (implantable) cardiac defibrillator: Secondary | ICD-10-CM | POA: Diagnosis not present

## 2019-03-05 DIAGNOSIS — I2510 Atherosclerotic heart disease of native coronary artery without angina pectoris: Secondary | ICD-10-CM | POA: Diagnosis not present

## 2019-03-08 ENCOUNTER — Ambulatory Visit: Payer: Medicare Other | Attending: Internal Medicine

## 2019-03-08 DIAGNOSIS — Z23 Encounter for immunization: Secondary | ICD-10-CM | POA: Insufficient documentation

## 2019-03-08 NOTE — Progress Notes (Signed)
   Covid-19 Vaccination Clinic  Name:  [name]    MRN: [id number] DOB: [birth year]  03/08/2019  [name] was observed post Covid-19 immunization for 15 minutes without incidence. He was provided with Vaccine Information Sheet and instruction to access the V-Safe system.   [name] was instructed to call 911 with any severe reactions post vaccine: Marland Kitchen Difficulty breathing  . Swelling of your face and throat  . A fast heartbeat  . A bad rash all over your body  . Dizziness and weakness    Immunizations Administered    Name Date Dose VIS Date Route   Pfizer COVID-19 Vaccine 03/08/2019  5:45 PM 0.3 mL 01/10/2019 Intramuscular   Manufacturer: [place]   Lot: [id number]   [place]: [id number]

## 2019-03-12 ENCOUNTER — Telehealth: Payer: Self-pay | Admitting: Cardiovascular Disease

## 2019-03-12 DIAGNOSIS — J449 Chronic obstructive pulmonary disease, unspecified: Secondary | ICD-10-CM | POA: Diagnosis not present

## 2019-03-12 DIAGNOSIS — J309 Allergic rhinitis, unspecified: Secondary | ICD-10-CM | POA: Diagnosis not present

## 2019-03-12 DIAGNOSIS — L501 Idiopathic urticaria: Secondary | ICD-10-CM | POA: Diagnosis not present

## 2019-03-12 DIAGNOSIS — Z91030 Bee allergy status: Secondary | ICD-10-CM | POA: Diagnosis not present

## 2019-03-12 NOTE — Telephone Encounter (Signed)
Follow up  Per [name] at [facility] patient states that he no longer wants to have the procedure done per the previous clearance information. Please disregard the clearance.

## 2019-03-12 NOTE — Progress Notes (Deleted)
Cardiology Office Note  Date: 03/12/2019   ID: [name], DOB April 08, 1931, MRN [id number]  PCP:  Sueanne Margarita, DO  Cardiologist:  Sherren Mocha, MD Electrophysiologist:  [name], MD   No chief complaint on file.   History of Present Illness: [name] is a 86 y.o. male here for preop clearance for endovascular stent graft surgery for abdominal aortic aneurysm 5.6 cm.  PMH acute on chronic combined systolic diastolic HF, CAD post CABG [facility] clinic, diabetes, right carotid stenosis status post endarterectomy November 07, 2018, PAF, Medtronic PPM/ICD in place, persistent atrial fibrillation, ICM with EF 35 to 40%, PVCs, NSVT, COPD, dofetilide therapy.At visit with Dr. Caryl Comes on 01/28/2019 DC cardioversion was anticipated the following week.  Recent ERCP for biliary ductal gallstone removal and secondary pancreatitis.  We received a message from Maryjane Hurter at 10:43 03/12/2019 as noted : Per [name] [facility] patient states that he no longer wants to have the procedure done per the previous clearance information. Please disregard the clearance.  Past Medical History:  Diagnosis Date  . Acute on chronic combined systolic (congestive) and diastolic (congestive) heart failure ([place])   . AICD (automatic cardioverter/defibrillator) present    Medtronicn PPM/ICD  . Carotid stenosis, right    s/p endarterectomy 11/07/18  . Chronic combined systolic (congestive) and diastolic (congestive) heart failure ([place])   . Coronary artery disease    [place]  [place] [facility]  . Diabetes mellitus   . Dysrhythmia   . Endocarditis, valve unspecified    Mitral  . Headache    hx migraines 6-7 x mo  . History of migraine headaches   . History of seasonal allergies   . ICD (implantable cardiac defibrillator) in place   . PAF (paroxysmal atrial fibrillation) ([place])    NO LAA occlusion at the time of surgery  [name] 2018  . Pleural effusion   . PVC's (premature  ventricular contractions)   . Ventricular tachycardia, polymorphic ([place])    status post ICD implantation    Past Surgical History:  Procedure Laterality Date  . CARDIAC CATHETERIZATION  04/03/2007   EF 40%  . CARDIAC CATHETERIZATION  02/06/2006   EF 45%. ANTERIOR HYPOKINESIS  . CARDIAC CATHETERIZATION  05/29/2000   EF 50%. MILD ANTERIOR HYPOKINESIS  . CARDIAC CATHETERIZATION  10/25/1999   EF 50%. SEVERE MITRAL REGURGITATION  . CARDIAC CATHETERIZATION  01/06/2003   EF 50%  . CARDIAC DEFIBRILLATOR PLACEMENT    . CARDIOVERSION N/A 02/07/2019   Procedure: CARDIOVERSION;  Surgeon: Fay Records, MD;  Location: [place];  Service: Cardiovascular;  Laterality: N/A;  . CATARACT EXTRACTION W/ INTRAOCULAR LENS  IMPLANT, BILATERAL    . CHOLECYSTECTOMY N/A 01/08/2019   Procedure: LAPAROSCOPIC CHOLECYSTECTOMY WITH INTRAOPERATIVE CHOLANGIOGRAM;  Surgeon: Donnie Mesa, MD;  Location: [place];  Service: General;  Laterality: N/A;  . CORONARY ARTERY BYPASS GRAFT  2001   2 VESSEL CABG AND MITRAL VALVE REPAIR. HE HAD A LIMA GRAFT TO THE LAD AND RADIAL ARTERY  GRAFT TO THE OBTUSE MARGINAL  OF LEFT CIRCUMFLEX  . ENDARTERECTOMY Right 11/07/2018   Procedure: ENDARTERECTOMY CAROTID RIGHT;  Surgeon: Waynetta Sandy, MD;  Location: [place];  Service: Vascular;  Laterality: Right;  . EP IMPLANTABLE DEVICE N/A 11/06/2014   Procedure: ICD Generator Changeout;  Surgeon: Deboraha Sprang, MD;  Location: [place] [facility];  Service: Cardiovascular;  Laterality: N/A;  . ERCP N/A 01/10/2019   Procedure: ENDOSCOPIC RETROGRADE CHOLANGIOPANCREATOGRAPHY (ERCP);  Surgeon: Watt Climes,  Altamese Dilling, MD;  Location: [place];  Service: Endoscopy;  Laterality: N/A;  . FOREIGN BODY REMOVAL Right 06/21/2017   Procedure: FOREIGN BODY REMOVAL ADULT RIGHT RING FINGER;  Surgeon: Leanora Cover, MD;  Location: [place];  Service: Orthopedics;  Laterality: Right;  . HEMORRHOID SURGERY    . HEMORROIDECTOMY    . INTRAVASCULAR PRESSURE WIRE/FFR STUDY  N/A 01/05/2017   Procedure: INTRAVASCULAR PRESSURE WIRE/FFR STUDY;  Surgeon: Sherren Mocha, MD;  Location: [place] [facility];  Service: Cardiovascular;  Laterality: N/A;  . KNEE ARTHROSCOPY     RIGHT KNEE  . LAPAROSCOPIC APPENDECTOMY N/A 01/08/2019   Procedure: APPENDECTOMY LAPAROSCOPIC;  Surgeon: Donnie Mesa, MD;  Location: [place];  Service: General;  Laterality: N/A;  . LEFT HEART CATHETERIZATION WITH CORONARY ANGIOGRAM N/A 07/04/2012   Procedure: LEFT HEART CATHETERIZATION WITH CORONARY ANGIOGRAM;  Surgeon: Sherren Mocha, MD;  Location: [facility];  Service: Cardiovascular;  Laterality: N/A;  . MITRAL VALVE REPLACEMENT     No LAA occlusion at the time of surgery  Karie Soda report 2018  . REMOVAL OF STONES  01/10/2019   Procedure: REMOVAL OF STONES;  Surgeon: Clarene Essex, MD;  Location: [place];  Service: Endoscopy;;  . RIGHT/LEFT HEART CATH AND CORONARY/GRAFT ANGIOGRAPHY N/A 01/05/2017   Procedure: RIGHT/LEFT HEART CATH AND CORONARY/GRAFT ANGIOGRAPHY;  Surgeon: Sherren Mocha, MD;  Location: [place] [facility];  Service: Cardiovascular;  Laterality: N/A;  . SPHINCTEROTOMY  01/10/2019   Procedure: SPHINCTEROTOMY;  Surgeon: Clarene Essex, MD;  Location: [place];  Service: Endoscopy;;  . TRANSTHORACIC ECHOCARDIOGRAM  04/02/2007   EF 35%    Current Outpatient Medications  Medication Sig Dispense Refill  . acetaminophen (TYLENOL) 500 MG tablet Take 2 tablets (1,000 mg total) by mouth every 8 (eight) hours as needed for mild pain, moderate pain or headache. 30 tablet 0  . apixaban (ELIQUIS) 5 MG TABS tablet Take 1 tablet (5 mg total) by mouth 2 (two) times daily. (Patient not taking: Reported on 02/28/2019) 180 tablet 1  . atorvastatin (LIPITOR) 80 MG tablet Take 1 tablet (80 mg total) by mouth at bedtime. Hold until liver function has normalized. (Patient not taking: Reported on 02/28/2019)    . Cholecalciferol (VITAMIN D) 125 MCG (5000 UT) CAPS Take 5,000-10,000 Units by mouth See admin  instructions. Take 10000 units by mouth on Monday, Wednesday and Friday and take 5000 units on Tuesday, Thursday, Saturday and Sunday    . dofetilide ([name]) 500 MCG capsule Take 1 capsule (500 mcg total) by mouth 2 (two) times daily. 180 capsule 3  . EPIPEN 2-PAK 0.3 MG/0.3ML SOAJ injection Inject 0.3 mg into the muscle daily as needed for anaphylaxis.     . fluticasone (FLONASE) 50 MCG/ACT nasal spray Place 1 spray into the nose daily as needed for rhinitis or allergies.    . furosemide (LASIX) 20 MG tablet Take 1 tablet (20 mg total) by mouth 2 (two) times daily for 14 days. (Patient not taking: Reported on 01/28/2019) 28 tablet 0  . Magnesium 250 MG TABS Take 250 mg by mouth daily.     . metoprolol succinate (TOPROL-XL) 50 MG 24 hr tablet Take 1 tablet (50 mg total) by mouth 2 (two) times daily. Take with or immediately following a meal. 180 tablet 3  . Multiple Vitamin (MULTIVITAMIN WITH MINERALS) TABS Take 1 tablet by mouth daily. Centrum Silver    . nateglinide (STARLIX) 120 MG tablet Take 120 mg by mouth 2 (two) times daily before a meal.     .  pioglitazone (ACTOS) 45 MG tablet Take 45 mg by mouth daily.      . sitaGLIPtin (JANUVIA) 100 MG tablet Take 100 mg by mouth at bedtime.     . Tiotropium Bromide-Olodaterol (STIOLTO RESPIMAT) 2.5-2.5 MCG/ACT AERS Inhale 2 puffs into the lungs daily. 1 Inhaler 5  . triamcinolone cream (KENALOG) 0.1 % Apply 1 application topically 2 (two) times daily as needed (rash).      No current facility-administered medications for this visit.   Allergies:  Bee venom, Latex, Metformin, Rivaroxaban, Sotalol, and Warfarin sodium   Social History: The patient  reports that he quit smoking about 48 years ago. His smoking use included cigarettes. He started smoking about 63 years ago. He has a 32.00 pack-year smoking history. He has never used smokeless tobacco. He reports current alcohol use. He reports that he does not use drugs.   Family History: The patient's  family history includes Cancer in his sister and another family member; Diabetes in an other family member; Emphysema in his father; Heart disease in his father, paternal grandfather, and paternal grandmother; Stroke in his mother.   ROS:  Please see the history of present illness. Otherwise, complete review of systems is positive for none.  All other systems are reviewed and negative.   Physical Exam: VS:  There were no vitals taken for this visit., BMI There is no height or weight on file to calculate BMI.  Wt Readings from Last 3 Encounters:  02/28/19 200 lb (90.7 kg)  02/07/19 198 lb 6.6 oz (90 kg)  01/28/19 198 lb 6.4 oz (90 kg)    General: Patient appears comfortable at rest. HEENT: Conjunctiva and lids normal, oropharynx clear with moist mucosa. Neck: Supple, no elevated JVP or carotid bruits, no thyromegaly. Lungs: Clear to auscultation, nonlabored breathing at rest. Cardiac: Regular rate and rhythm, no S3 or significant systolic murmur, no pericardial rub. Abdomen: Soft, nontender, no hepatomegaly, bowel sounds present, no guarding or rebound. Extremities: No pitting edema, distal pulses 2+. Skin: Warm and dry. Musculoskeletal: No kyphosis. Neuropsychiatric: Alert and oriented x3, affect grossly appropriate.  ECG:  An ECG dated *** was personally reviewed today and demonstrated:  ***  Recent Labwork: 01/03/2019: B Natriuretic Peptide 295.0 01/08/2019: TSH 2.274 01/14/2019: ALT 90; AST 30; Magnesium 1.9 01/28/2019: Platelets 233 02/07/2019: BUN 12; Creatinine, Ser 0.90; Hemoglobin 13.6; Potassium 4.3; Sodium 136     Component Value Date/Time   CHOL 110 11/05/2018 0256   TRIG 90 11/05/2018 0256   HDL 48 11/05/2018 0256   CHOLHDL 2.3 11/05/2018 0256   VLDL 18 11/05/2018 0256   LDLCALC 44 11/05/2018 0256    Other Studies Reviewed Today:  Echocardiogram 11/05/2018 1. Left ventricular ejection fraction, by visual estimation, is 35 to 40%. The left ventricle has moderately  decreased function. There is global left ventricular hypokinesis and marked apical-basal and septal-lateral dyssynchrony. There appears to be apical akinesis. 2. Abnormal septal motion consistent with RV pacemaker. 3. Global right ventricle has moderately reduced systolic function.The right ventricular size is normal. No increase in right ventricular wall thickness. 4. Left atrial size was severely dilated. 5. Right atrial size was moderately dilated. 6. Mild mitral valve regurgitation. Mild-moderate mitral stenosis. Peak and mean gradients are 16 and 5 mm Hg, respectively, at 70 bpm. Estimated valve area is 1.4 cm sq by the PHT method. 7. Moderate calcification of the mitral valve leaflet(s). 8. Severe mitral annular calcification. 9. Severe thickening of the mitral valve leaflet(s). 10. The tricuspid valve is normal  in structure. Tricuspid valve regurgitation is mild-moderate. 11. The aortic valve is normal in structure. Aortic valve regurgitation was not visualized by color flow Doppler. 12. The pulmonic valve was normal in structure. Pulmonic valve regurgitation is mild by color flow Doppler. 13. Mildly elevated pulmonary artery systolic pressure. 14. A pacer wire is visualized. 15. The inferior vena cava is dilated in size with >50% respiratory variability, suggesting right atrial pressure of 8 mmHg. 16. Left ventricular diastolic function could not be evaluated due to atrial fibrillation and mitral stenosis. 38. Compared to February 2020, mitral valve gradients are worse, probably due to new atrial fibrillation and increased ventricular rate.  CT abdomen with contrast 01/06/2019 1. Peripancreatic edema/inflammation, similar to prior. Acute pancreatitis could have this appearance. There is no dilatation ofthe main pancreatic duct. No findings to suggest pancreatic necrosis. 2. No focal or rim enhancing fluid collection in the abdomen/pelvis. 3. Distended gallbladder with calcified  gallstones. 4. Intra and extrahepatic biliary duct dilatation with extrahepatic common duct measuring up to 13 mm. No calcified stone visualizedwithin the extrahepatic bile ducts on today's study. MRCP or ERCPmay prove helpful to further evaluate as clinically warranted. 5. Since the prior study, the appendix is become more distended andfluid-filled. Appendiceal diameter is abnormal today at 13 mm. Appendiceal lumen is diffusely air-filled on the study from 3 daysago. Features on today's exam may be secondary to irritation fromthe intraperitoneal fluid and adjacent extraperitoneal inflammatorychange. Acute appendicitis cannot be definitively excluded by imaging on this study.  6. Small volume intraperitoneal free fluid. 7. Small bilateral groin hernias contain only fat. 8. Abdominal aortic aneurysm. Vascular surgery consultation recommended due to increased risk of rupture for AAA >5.5 cm. This recommendation follows ACR consensus guidelines: White Paper of the ACR Incidental Findings Committee II on Vascular Findings. J Am Coll Radiol 2013; 10:789-794. Aortic aneurysm NOS (ICD10-I71.9)   Assessment and Plan:  No diagnosis found.   Medication Adjustments/Labs and Tests Ordered: Current medicines are reviewed at length with the patient today.  Concerns regarding medicines are outlined above.    There are no Patient Instructions on file for this visit.       Signed, Levell July, NP 03/12/2019 11:30 PM    [facility] [place], [place], [place] [address] Phone: [phone]; Fax: [phone]

## 2019-03-12 NOTE — Telephone Encounter (Signed)
New Message      [facility] Pre-operative Risk Assessment    Request for surgical clearance:  1. What type of surgery is being performed? Abdominal endovascular stent   2. When is this surgery scheduled? 04/10/2019   3. What type of clearance is required (medical clearance vs. Pharmacy clearance to hold med vs. Both)? Medical   4. Are there any medications that need to be held prior to surgery and how long? n/a   5. Practice name and name of physician performing surgery? Dr. Servando Snare with [facility]   6. What is your office phone number [phone]    7.   What is your office fax number [phone]  8.   Anesthesia type (None, local, MAC, general) ? Unknown office has not decided    Patient is scheduled to see Pecolia Ades on 03/13/19  [name] 03/12/2019, 10:27 AM  _________________________________________________________________   (provider comments below)

## 2019-03-12 NOTE — Telephone Encounter (Signed)
Patient has appointment with Pecolia Ades, NP, scheduled for tomorrow (03/13/2019) at 3:45pm. Therefore, pre-operative risk can be assessed at that time. Will route request form to Gae Bon so that she is aware and remove from pre-op pool.  Darreld Mclean, PA-C 03/12/2019 10:44 AM

## 2019-03-13 ENCOUNTER — Telehealth: Payer: Self-pay | Admitting: Cardiology

## 2019-03-13 ENCOUNTER — Ambulatory Visit: Payer: Medicare Other | Admitting: Cardiology

## 2019-03-13 NOTE — Telephone Encounter (Signed)
Patient is calling in regards to his appointment scheduled for today 03/13/19. He wanted to clarify the appointment was for surgical clearance for his procedure that is scheduled for 04/10/19. He states if that is what the appointment is in regards to he no longer needs it due to canceling the surgery. Please advise.

## 2019-03-28 ENCOUNTER — Telehealth: Payer: Self-pay | Admitting: Emergency Medicine

## 2019-03-28 NOTE — Telephone Encounter (Signed)
Spoke with pt who states he checked his defib this am and is in Afib again after being cardioverted 7 weeks ago.  Pt states he checks his rhythm every morning.   Pt denies current symptoms.  Requested pt send a transmission from his device and continue to monitor at this time.  Pt will recheck rhythm after lunch and report information to triage.  Pt will go to ED if develops CP or SOB.  Pt verbalizes understanding and agrees with current plan.

## 2019-03-28 NOTE — Telephone Encounter (Signed)
Patient called triage earlier and was concerned he is in AF because of a reading on his home monitor  That tracks heart rate. He has been asymptomatic.. Triage had patient send remote transmission. Transmission was reviewed and appears to show AT. Transmission reviewed with Dr Lovena Le and current EGM appears to ne MAT.Spoke with patient and informed him that he has is  in AT and that he has had episodes of AT treated with ATP on 2 different occasions since the cardioversion in January. Confirmed patient has discontinued his Eliquis in anticipation of a surgical procedure that he has chosen to cancel. Informed patient that if he is not having surgery then he should resume Eliquis as ordered by Dr Caryl Comes due to risk of stroke related to AF. Patient reports he will resume Eliquis and seek treatment in ED if he develops CP, SOB or syncope . Will forward note to Dr Caryl Comes for review and patient aware Dr Caryl Comes will return to clinic Monday 03/31/19.

## 2019-04-01 NOTE — Telephone Encounter (Signed)
C  if he remains asxymptomatic then would plan to review after 3weeks of anticoagulation we should cardiovert at that time  Thanks SK

## 2019-04-02 ENCOUNTER — Ambulatory Visit: Payer: Medicare Other | Attending: Internal Medicine

## 2019-04-02 DIAGNOSIS — Z23 Encounter for immunization: Secondary | ICD-10-CM | POA: Insufficient documentation

## 2019-04-02 NOTE — Progress Notes (Signed)
   Covid-19 Vaccination Clinic  Name:  LABRADFORD SAINZ    MRN: [id number] DOB: 03/18/1931  04/02/2019  Mr. Spurlock was observed post Covid-19 immunization for 15 minutes without incident. He was provided with Vaccine Information Sheet and instruction to access the V-Safe system.   Mr. Tuazon was instructed to call 911 with any severe reactions post vaccine: Marland Kitchen Difficulty breathing  . Swelling of face and throat  . A fast heartbeat  . A bad rash all over body  . Dizziness and weakness   Immunizations Administered    Name Date Dose VIS Date Route   Pfizer COVID-19 Vaccine 04/02/2019  2:40 PM 0.3 mL 01/10/2019 Intramuscular   Manufacturer: [place]   Lot: [id number]   [place]: [id number]

## 2019-04-03 NOTE — Telephone Encounter (Signed)
LMOM for patient to continue Eliquis as ordered and that after remote transmission on 05/09/19 Dr Caryl Comes will evaluate if patient is in AF at that time to determine what treatament plan he will move forward with.Marland Kitchen

## 2019-04-07 ENCOUNTER — Other Ambulatory Visit (HOSPITAL_COMMUNITY): Payer: Medicare Other

## 2019-04-10 ENCOUNTER — Inpatient Hospital Stay: Admit: 2019-04-10 | Payer: Medicare Other | Admitting: Vascular Surgery

## 2019-04-10 SURGERY — INSERTION, ENDOVASCULAR STENT GRAFT, AORTA, ABDOMINAL
Anesthesia: General

## 2019-04-14 DIAGNOSIS — R7989 Other specified abnormal findings of blood chemistry: Secondary | ICD-10-CM | POA: Diagnosis not present

## 2019-04-14 DIAGNOSIS — E7849 Other hyperlipidemia: Secondary | ICD-10-CM | POA: Diagnosis not present

## 2019-04-14 DIAGNOSIS — I255 Ischemic cardiomyopathy: Secondary | ICD-10-CM | POA: Diagnosis not present

## 2019-04-14 DIAGNOSIS — Z952 Presence of prosthetic heart valve: Secondary | ICD-10-CM | POA: Diagnosis not present

## 2019-04-14 DIAGNOSIS — Z8639 Personal history of other endocrine, nutritional and metabolic disease: Secondary | ICD-10-CM | POA: Diagnosis not present

## 2019-04-14 DIAGNOSIS — I25810 Atherosclerosis of coronary artery bypass graft(s) without angina pectoris: Secondary | ICD-10-CM | POA: Diagnosis not present

## 2019-04-14 DIAGNOSIS — I480 Paroxysmal atrial fibrillation: Secondary | ICD-10-CM | POA: Diagnosis not present

## 2019-04-14 DIAGNOSIS — I5042 Chronic combined systolic (congestive) and diastolic (congestive) heart failure: Secondary | ICD-10-CM | POA: Diagnosis not present

## 2019-04-14 DIAGNOSIS — J449 Chronic obstructive pulmonary disease, unspecified: Secondary | ICD-10-CM | POA: Diagnosis not present

## 2019-04-14 DIAGNOSIS — Z8679 Personal history of other diseases of the circulatory system: Secondary | ICD-10-CM | POA: Diagnosis not present

## 2019-04-14 DIAGNOSIS — I714 Abdominal aortic aneurysm, without rupture: Secondary | ICD-10-CM | POA: Diagnosis not present

## 2019-04-14 DIAGNOSIS — E559 Vitamin D deficiency, unspecified: Secondary | ICD-10-CM | POA: Diagnosis not present

## 2019-04-21 ENCOUNTER — Telehealth: Payer: Self-pay | Admitting: Pulmonary Disease

## 2019-04-21 NOTE — Telephone Encounter (Signed)
Pt needs to be scheduled for an appt prior to [name] refilling pt's Stiolto inhaler.  Attempted to call pt but unable to reach. Left message for pt to return call.  When pt returns call, please schedule pt with either Dr. Halford Chessman or an APP so we can be able to refill med for pt.

## 2019-04-23 ENCOUNTER — Other Ambulatory Visit: Payer: Self-pay

## 2019-04-23 ENCOUNTER — Encounter: Payer: Self-pay | Admitting: Pulmonary Disease

## 2019-04-23 ENCOUNTER — Ambulatory Visit (INDEPENDENT_AMBULATORY_CARE_PROVIDER_SITE_OTHER): Payer: Medicare Other | Admitting: Pulmonary Disease

## 2019-04-23 VITALS — BP 116/68 | HR 64 | Temp 98.4°F | Ht 73.0 in | Wt 210.0 lb

## 2019-04-23 DIAGNOSIS — Z7901 Long term (current) use of anticoagulants: Secondary | ICD-10-CM

## 2019-04-23 DIAGNOSIS — J449 Chronic obstructive pulmonary disease, unspecified: Secondary | ICD-10-CM | POA: Diagnosis not present

## 2019-04-23 MED ORDER — STIOLTO RESPIMAT 2.5-2.5 MCG/ACT IN AERS: 2.0000 | INHALATION_SPRAY | Freq: Every day | RESPIRATORY_TRACT | 0 refills | Status: DC

## 2019-04-23 MED ORDER — STIOLTO RESPIMAT 2.5-2.5 MCG/ACT IN AERS: 2.0000 | INHALATION_SPRAY | Freq: Every day | RESPIRATORY_TRACT | 12 refills | Status: DC

## 2019-04-23 NOTE — Assessment & Plan Note (Signed)
Plan: Continue Stiolto Respimat Sample provided today Refills provided for patient Personally called the pharmacist to ensure the patient be L to receive his inhalers, they will contact our office directly if there is any issues Continue to be physically active

## 2019-04-23 NOTE — Progress Notes (Signed)
Reviewed and agree with assessment/plan.   Lester Crickenberger, MD [facility] 01/26/2016, 12:24 PM Pager:  [phone]  

## 2019-04-23 NOTE — Patient Instructions (Addendum)
You were seen today by Lauraine Rinne, NP  For:  [facility] seeing you today.  We have refilled your Stiolto Respimat.  Please follow-up with [name] if you have additional questions or concerns.  Take care and stay safe,  [name]   1. Asthma-COPD overlap syndrome (HCC)  Stiolto Respimat inhaler >>>2 puffs daily >>>Take this no matter what >>>This is not a rescue inhaler  Note your daily symptoms > remember "red flags" for COPD:   >>>Increase in cough >>>increase in sputum production >>>increase in shortness of breath or activity  intolerance.   If you notice these symptoms, please call the office to be seen.    2. Chronic anticoagulation  I would follow-up with cardiology and discuss your nosebleed from her right nostril  You may be a good candidate for decreasing the Eliquis dose especially given the fact that status post her cardioversion you have not been in A. fib  I would discuss this with Dr. Caryl Comes  If you do want to be referred to an ear nose and throat specialist I would recommend either Dr. Wilburn Cornelia or Dr. Redmond Baseman with [facility]   We recommend today:   Meds ordered this encounter  Medications  . Tiotropium Bromide-Olodaterol (STIOLTO RESPIMAT) 2.5-2.5 MCG/ACT AERS    Sig: Inhale 2 puffs into the lungs daily.    Dispense:  4 g    Refill:  12    Follow Up:    Return in about 1 year (around 04/22/2020), or if symptoms worsen or fail to improve, for Follow up with Dr. Halford Chessman.   Please do your part to reduce the spread of COVID-19:      Reduce your risk of any infection  and COVID19 by using the similar precautions used for avoiding the common cold or flu:  Marland Kitchen Wash your hands often with soap and warm water for at least 20 seconds.  If soap and water are not readily available, use an alcohol-based hand sanitizer with at least 60% alcohol.  . If coughing or sneezing, cover your mouth and nose by coughing or sneezing into the elbow areas of your shirt or coat, into a tissue  or into your sleeve (not your hands). Langley Gauss A MASK when in public  . Avoid shaking hands with others and consider head nods or verbal greetings only. . Avoid touching your eyes, nose, or mouth with unwashed hands.  . Avoid close contact with people who are sick. . Avoid places or events with large numbers of people in one location, like concerts or sporting events. . If you have some symptoms but not all symptoms, continue to monitor at home and seek medical attention if your symptoms worsen. . If you are having a medical emergency, call 911.   [place] / e-Visit: [email or web address]         [facility]: [facility]: [id number]                   [facility]: [id number]     It is flu season:   >>> Best ways to protect herself from the flu: Receive the yearly flu vaccine, practice good hand hygiene washing with soap and also using hand sanitizer when available, eat a nutritious meals, get adequate rest, hydrate appropriately   Please contact the office if your symptoms worsen or you have concerns that you are not improving.   Thank you for choosing  [facility] for your healthcare, and for allowing [name] to partner with you on your healthcare journey. I am thankful to be able to provide care to you today.   [name] FNP-C

## 2019-04-23 NOTE — Progress Notes (Signed)
@Patient  ID: [name], male    DOB: 05-15-1931, 86 y.o.   MRN: [id number]  Chief Complaint  Patient presents with  . Follow-up    62yr f/u. States breathing has been doing well since last visit.     Referring provider: Sueanne Margarita, DO  HPI:  86 year old male former smoker followed in our office for SOB and presumed COPD  PMH: CAD, VT, PAF, and systolic CHF. Smoker/ Smoking History: Former Smoker. 32 pack year. Stopped 1973. Maintenance:  Stiolto Respimat Pt of: Dr. Halford Chessman  04/23/2019  - Visit   86 year old male former smoker followed in our office for COPD.  Patient completing a 1 year follow-up with our office today.  He needs refills of his Stiolto Respimat.  Patient reports that since last being seen he has done quite well.  His breathing is stable.  He has received both Covid vaccines and he tolerated them well without any reaction.  Patient remains active.  He was hospitalized few months ago and had to have a cholecystectomy as well as had his appendix removed.  Home health was ordered to follow-up with him.  Home health provided the patient with home physical therapy exercises which she has been doing.  Patient does continue to report a left nare nosebleed.  He is maintained on Eliquis.  Questionaires / Pulmonary Flowsheets:   MMRC: mMRC Dyspnea Scale mMRC Score  04/23/2019 0   Tests:   Echo 12/26/17 >> EF 35 to 40%, mild MS and MR, PAS 50 mmHg  01/19/2003-CT chest with contrast- COPD, hyperinflated, no evidence of PE  02/22/2018-pulmonary function test-FVC 3.31 (75% predicted), postbronchodilator ratio of 74, postbronchodilator FEV1 2.55 (82 percent predicted), mid flow reversibility, no significant bronchodilator response, DLCO 52, concavity in flow volume loops >>> Patient has used Anoro Ellipta this morning before testing   FENO:  No results found for: NITRICOXIDE  PFT: PFT Results Latest Ref Rng & Units 02/22/2018  FVC-Pre L 3.31  FVC-Predicted Pre %  75  FVC-Post L 3.46  FVC-Predicted Post % 79  Pre FEV1/FVC % % 71  Post FEV1/FCV % % 74  FEV1-Pre L 2.36  FEV1-Predicted Pre % 76  FEV1-Post L 2.55  DLCO UNC% % 52  DLCO COR %Predicted % 74  TLC L 5.82  TLC % Predicted % 75  RV % Predicted % 91    WALK:  No flowsheet data found.  Imaging: No results found.  Lab Results:  CBC    Component Value Date/Time   WBC 5.0 01/28/2019 1516   WBC 7.8 01/14/2019 0423   RBC 3.64 (L) 01/28/2019 1516   RBC 3.18 (L) 01/14/2019 0423   HGB 13.6 02/07/2019 0753   HGB 12.3 (L) 01/28/2019 1516   HCT 40.0 02/07/2019 0753   HCT 36.0 (L) 01/28/2019 1516   PLT 233 01/28/2019 1516   MCV 99 (H) 01/28/2019 1516   MCH 33.8 (H) 01/28/2019 1516   MCH 34.0 01/14/2019 0423   MCHC 34.2 01/28/2019 1516   MCHC 33.6 01/14/2019 0423   RDW 13.4 01/28/2019 1516   LYMPHSABS 0.4 (L) 01/11/2019 0500   LYMPHSABS 0.9 12/27/2016 1523   MONOABS 0.4 01/11/2019 0500   EOSABS 0.3 01/11/2019 0500   EOSABS 0.2 12/27/2016 1523   BASOSABS 0.0 01/11/2019 0500   BASOSABS 0.0 12/27/2016 1523    BMET    Component Value Date/Time   NA 136 02/07/2019 0753   NA 134 01/28/2019 1516   K 4.3 02/07/2019 0753  CL 102 02/07/2019 0753   CO2 24 01/28/2019 1516   GLUCOSE 178 (H) 02/07/2019 0753   BUN 12 02/07/2019 0753   BUN 15 01/28/2019 1516   CREATININE 0.90 02/07/2019 0753   CREATININE 1.08 01/04/2016 1630   CALCIUM 9.4 01/28/2019 1516   GFRNONAA 68 01/28/2019 1516   GFRAA 79 01/28/2019 1516    BNP    Component Value Date/Time   BNP 295.0 (H) 01/03/2019 1548    ProBNP    Component Value Date/Time   PROBNP 1,465 (H) 12/18/2017 1506   PROBNP 463.0 (H) 04/02/2007 0603    Specialty Problems      Pulmonary Problems   Asthma-COPD overlap syndrome (HCC)    02/22/2018-pulmonary function test-FVC 3.31 (75% predicted), postbronchodilator ratio of 74, postbronchodilator FEV1 2.55 (82 percent predicted), mid flow reversibility, no significant bronchodilator  response, DLCO 52, concavity in flow volume loops >>> Patient has used Anoro Ellipta this morning before testing      Cough      Allergies  Allergen Reactions  . Bee Venom Anaphylaxis  . Latex Other (See Comments)    REDNESS AT REACTION SITE.  Marland Kitchen Metformin Diarrhea  . Rivaroxaban Other (See Comments)    Headaches, blurred vision  . Sotalol Other (See Comments)    "BLUE TOES"- cut his circulation off  . Warfarin Sodium Other (See Comments)    REACTION: migraine headaches/vision impariment    Immunization History  Administered Date(s) Administered  . Influenza Split 11/07/2017  . Influenza, High Dose Seasonal PF 11/15/2017  . PFIZER SARS-COV-2 Vaccination 03/08/2019, 04/02/2019  . Zoster Recombinat (Shingrix) 10/11/2018    Past Medical History:  Diagnosis Date  . Acute on chronic combined systolic (congestive) and diastolic (congestive) heart failure ([place])   . AICD (automatic cardioverter/defibrillator) present    [employer]n PPM/ICD  . Carotid stenosis, right    s/p endarterectomy 11/07/18  . Chronic combined systolic (congestive) and diastolic (congestive) heart failure ([place])   . Coronary artery disease    [facility]  . Diabetes mellitus   . Dysrhythmia   . Endocarditis, valve unspecified    Mitral  . Headache    hx migraines 6-7 x mo  . History of migraine headaches   . History of seasonal allergies   . ICD (implantable cardiac defibrillator) in place   . PAF (paroxysmal atrial fibrillation) ([place])    NO LAA occlusion at the time of surgery  [name] 2018  . Pleural effusion   . PVC's (premature ventricular contractions)   . Ventricular tachycardia, polymorphic ([facility])    status post ICD implantation    Tobacco History: Social History   Tobacco Use  Smoking Status Former Smoker  . Packs/day: 2.00  . Years: 16.00  . Pack years: 32.00  . Types: Cigarettes  . Start date: 02/23/1956  . Quit date: 01/31/1971  . Years since quitting: 48.2    Smokeless Tobacco Never Used   Counseling given: Yes   Continue to not smoke  Outpatient Encounter Medications as of 04/23/2019  Medication Sig  . acetaminophen (TYLENOL) 500 MG tablet Take 2 tablets (1,000 mg total) by mouth every 8 (eight) hours as needed for mild pain, moderate pain or headache.  Marland Kitchen apixaban (ELIQUIS) 5 MG TABS tablet Take 1 tablet (5 mg total) by mouth 2 (two) times daily.  Marland Kitchen atorvastatin (LIPITOR) 80 MG tablet Take 1 tablet (80 mg total) by mouth at bedtime. Hold until liver function has normalized.  . Cholecalciferol (VITAMIN D) 125  MCG (5000 UT) CAPS Take 5,000-10,000 Units by mouth See admin instructions. Take 10000 units by mouth on Monday, Wednesday and Friday and take 5000 units on Tuesday, Thursday, Saturday and Sunday  . dofetilide ([name]) 500 MCG capsule Take 1 capsule (500 mcg total) by mouth 2 (two) times daily.  Marland Kitchen EPIPEN 2-PAK 0.3 MG/0.3ML SOAJ injection Inject 0.3 mg into the muscle daily as needed for anaphylaxis.   . fluticasone (FLONASE) 50 MCG/ACT nasal spray Place 1 spray into the nose daily as needed for rhinitis or allergies.  . furosemide (LASIX) 20 MG tablet Take 1 tablet (20 mg total) by mouth 2 (two) times daily for 14 days.  . Magnesium 250 MG TABS Take 250 mg by mouth daily.   . metoprolol succinate (TOPROL-XL) 50 MG 24 hr tablet Take 1 tablet (50 mg total) by mouth 2 (two) times daily. Take with or immediately following a meal. (Patient taking differently: Take 50 mg by mouth daily. Take with or immediately following a meal.)  . nateglinide (STARLIX) 120 MG tablet Take 120 mg by mouth 2 (two) times daily before a meal.   . pioglitazone (ACTOS) 45 MG tablet Take 45 mg by mouth daily.    . sitaGLIPtin (JANUVIA) 100 MG tablet Take 100 mg by mouth at bedtime.   . Tiotropium Bromide-Olodaterol (STIOLTO RESPIMAT) 2.5-2.5 MCG/ACT AERS Inhale 2 puffs into the lungs daily.  Marland Kitchen triamcinolone cream (KENALOG) 0.1 % Apply 1 application topically 2 (two)  times daily as needed (rash).   . [DISCONTINUED] Tiotropium Bromide-Olodaterol (STIOLTO RESPIMAT) 2.5-2.5 MCG/ACT AERS Inhale 2 puffs into the lungs daily.  . Tiotropium Bromide-Olodaterol (STIOLTO RESPIMAT) 2.5-2.5 MCG/ACT AERS Inhale 2 puffs into the lungs daily.  . [DISCONTINUED] Multiple Vitamin (MULTIVITAMIN WITH MINERALS) TABS Take 1 tablet by mouth daily. Centrum Silver   No facility-administered encounter medications on file as of 04/23/2019.     Review of Systems  Review of Systems  Constitutional: Negative for activity change, chills, fatigue, fever and unexpected weight change.  HENT: Negative for postnasal drip, rhinorrhea, sinus pressure, sinus pain and sore throat.   Eyes: Negative.   Respiratory: Negative for cough, shortness of breath and wheezing.   Cardiovascular: Negative for chest pain and palpitations.  Gastrointestinal: Negative for constipation, diarrhea, nausea and vomiting.  Endocrine: Negative.   Genitourinary: Negative.   Musculoskeletal: Negative.   Skin: Negative.   Neurological: Negative for dizziness and headaches.  Psychiatric/Behavioral: Negative.  Negative for dysphoric mood. The patient is not nervous/anxious.   All other systems reviewed and are negative.    Physical Exam  BP 116/68   Pulse 64   Temp 98.4 F (36.9 C) (Temporal)   Ht 6\' 1"  (1.854 m)   Wt 210 lb (95.3 kg)   SpO2 99%   BMI 27.71 kg/m   Wt Readings from Last 5 Encounters:  04/23/19 210 lb (95.3 kg)  02/28/19 200 lb (90.7 kg)  02/07/19 198 lb 6.6 oz (90 kg)  01/28/19 198 lb 6.4 oz (90 kg)  01/20/19 200 lb 9.6 oz (91 kg)    BMI Readings from Last 5 Encounters:  04/23/19 27.71 kg/m  02/28/19 26.39 kg/m  02/07/19 26.18 kg/m  01/28/19 26.18 kg/m  01/20/19 26.47 kg/m     Physical Exam Vitals and nursing note reviewed.  Constitutional:      General: He is not in acute distress.    Appearance: Normal appearance. He is normal weight.  HENT:     Head:  Normocephalic and atraumatic.  Right Ear: Hearing and external ear normal.     Left Ear: Hearing and external ear normal.     Nose: No mucosal edema or rhinorrhea.     Right Nostril: Epistaxis (slight) present.     Right Turbinates: Not enlarged.     Left Turbinates: Not enlarged.  Cardiovascular:     Rate and Rhythm: Normal rate and regular rhythm.     Pulses: Normal pulses.     Heart sounds: Normal heart sounds. No murmur.  Pulmonary:     Effort: Pulmonary effort is normal.     Breath sounds: Normal breath sounds. No decreased breath sounds, wheezing or rales.  Musculoskeletal:     Cervical back: Normal range of motion.     Right lower leg: No edema.     Left lower leg: No edema.  Lymphadenopathy:     Cervical: No cervical adenopathy.  Skin:    General: Skin is warm and dry.     Capillary Refill: Capillary refill takes less than 2 seconds.     Findings: No erythema or rash.  Neurological:     General: No focal deficit present.     Mental Status: He is alert and oriented to person, place, and time.     Motor: No weakness.     Coordination: Coordination normal.     Gait: Gait is intact. Gait normal.  Psychiatric:        Mood and Affect: Mood normal.        Behavior: Behavior normal. Behavior is cooperative.        Thought Content: Thought content normal.        Judgment: Judgment normal.       Assessment & Plan:   Asthma-COPD overlap syndrome (HCC) Plan: Continue Stiolto Respimat Sample provided today Refills provided for patient Personally called the pharmacist to ensure the patient be L to receive his inhalers, they will contact our office directly if there is any issues Continue to be physically active   Chronic anticoagulation Continues to have nosebleeds from left nare Slow to heal Maintained on anticoagulation due to history of A. fib Status post cardio version last year  Plan: Patient can follow-up with cardiology to see if they would consider  decreasing dose down given the fact that he is in normal sinus rhythm status post cardioversion Explained to the patient this would be a discussion of benefits versus risk with cardiology team Patient reports that he will follow up with him     Return in about 1 year (around 04/22/2020), or if symptoms worsen or fail to improve, for Follow up with Dr. Halford Chessman.   Lauraine Rinne, NP 04/23/2019   This appointment required 24 minutes of patient care (this includes precharting, chart review, review of results, face-to-face care, etc.).

## 2019-04-23 NOTE — Telephone Encounter (Signed)
Pt was scheduled to see Aaron Edelman today. Message will be closed.

## 2019-04-23 NOTE — Assessment & Plan Note (Signed)
Continues to have nosebleeds from left nare Slow to heal Maintained on anticoagulation due to history of A. fib Status post cardio version last year  Plan: Patient can follow-up with cardiology to see if they would consider decreasing dose down given the fact that he is in normal sinus rhythm status post cardioversion Explained to the patient this would be a discussion of benefits versus risk with cardiology team Patient reports that he will follow up with him

## 2019-05-09 ENCOUNTER — Ambulatory Visit (INDEPENDENT_AMBULATORY_CARE_PROVIDER_SITE_OTHER): Payer: Medicare Other | Admitting: *Deleted

## 2019-05-09 DIAGNOSIS — I509 Heart failure, unspecified: Secondary | ICD-10-CM

## 2019-05-09 LAB — CUP PACEART REMOTE DEVICE CHECK
Battery Remaining Longevity: 31 mo
Battery Voltage: 2.95 V
Brady Statistic AP VP Percent: 76.66 %
Brady Statistic AP VS Percent: 0.65 %
Brady Statistic AS VP Percent: 16.35 %
Brady Statistic AS VS Percent: 6.34 %
Brady Statistic RA Percent Paced: 75.04 %
Brady Statistic RV Percent Paced: 91.55 %
Date Time Interrogation Session: 20210409033523
HighPow Impedance: 38 Ohm
HighPow Impedance: 50 Ohm
Implantable Lead Implant Date: 20040309
Implantable Lead Implant Date: 20040309
Implantable Lead Location: 753859
Implantable Lead Location: 753860
Implantable Lead Model: 158
Implantable Lead Model: 5076
Implantable Lead Serial Number: 115061
Implantable Pulse Generator Implant Date: 20161007
Lead Channel Impedance Value: 399 Ohm
Lead Channel Impedance Value: 4047 Ohm
Lead Channel Impedance Value: 4047 Ohm
Lead Channel Impedance Value: 4047 Ohm
Lead Channel Impedance Value: 4047 Ohm
Lead Channel Impedance Value: 4047 Ohm
Lead Channel Impedance Value: 4047 Ohm
Lead Channel Impedance Value: 4047 Ohm
Lead Channel Impedance Value: 4047 Ohm
Lead Channel Impedance Value: 4047 Ohm
Lead Channel Impedance Value: 4047 Ohm
Lead Channel Impedance Value: 456 Ohm
Lead Channel Impedance Value: 475 Ohm
Lead Channel Pacing Threshold Amplitude: 0.5 V
Lead Channel Pacing Threshold Amplitude: 0.75 V
Lead Channel Pacing Threshold Pulse Width: 0.4 ms
Lead Channel Pacing Threshold Pulse Width: 0.4 ms
Lead Channel Sensing Intrinsic Amplitude: 1.75 mV
Lead Channel Sensing Intrinsic Amplitude: 1.75 mV
Lead Channel Sensing Intrinsic Amplitude: 7.75 mV
Lead Channel Sensing Intrinsic Amplitude: 7.75 mV
Lead Channel Setting Pacing Amplitude: 2 V
Lead Channel Setting Pacing Amplitude: 2.5 V
Lead Channel Setting Pacing Pulse Width: 0.4 ms
Lead Channel Setting Sensing Sensitivity: 0.3 mV

## 2019-05-09 NOTE — Progress Notes (Signed)
ICD Remote  

## 2019-05-12 ENCOUNTER — Ambulatory Visit (INDEPENDENT_AMBULATORY_CARE_PROVIDER_SITE_OTHER): Payer: Medicare Other | Admitting: Cardiovascular Disease

## 2019-05-12 ENCOUNTER — Encounter: Payer: Self-pay | Admitting: Cardiovascular Disease

## 2019-05-12 ENCOUNTER — Other Ambulatory Visit: Payer: Self-pay

## 2019-05-12 VITALS — BP 108/66 | HR 64 | Ht 73.0 in | Wt 208.0 lb

## 2019-05-12 DIAGNOSIS — E782 Mixed hyperlipidemia: Secondary | ICD-10-CM | POA: Diagnosis not present

## 2019-05-12 DIAGNOSIS — I25810 Atherosclerosis of coronary artery bypass graft(s) without angina pectoris: Secondary | ICD-10-CM | POA: Diagnosis not present

## 2019-05-12 DIAGNOSIS — I480 Paroxysmal atrial fibrillation: Secondary | ICD-10-CM | POA: Diagnosis not present

## 2019-05-12 DIAGNOSIS — I5022 Chronic systolic (congestive) heart failure: Secondary | ICD-10-CM | POA: Diagnosis not present

## 2019-05-12 NOTE — Patient Instructions (Signed)

## 2019-05-12 NOTE — Progress Notes (Signed)
Cardiology Office Note:    Date:  05/12/2019   ID:  [name], DOB December 26, 1931, MRN [id number]  PCP:  Sueanne Margarita, DO  Cardiologist:  Sherren Mocha, MD  Electrophysiologist:  [name], MD   Referring MD: Sueanne Margarita, DO   Chief Complaint  Patient presents with  . Coronary Artery Disease    History of Present Illness:    [name] is a 86 y.o. male with a hx of CAD s/p CABG, mitral valve repair, AICD placement after episodes of VT, HTN, DM, PAF and HLD presents for follow up evaluation.   Patient has a history of coronary artery disease with remote CABG and mitral valve repair. Last cath 12/2016 as below:  1. Severe 2 vessel CAD with continued patency of the LIMA-LAD and right radial graft-OM 2. Moderate, severely calcified, proximal circumflex stenosis with negative FFR assessment 3. Normal right heart hemodynamics except for a prominent V wave in the PCWP tracing 4. Normal/low LVEDP  Recommend: ongoing medical therapy. Pt appears to be well-compensated from a hemodynamic perspective.   The patient is here alone today.  He has been feeling well.  He denies any chest pain, chest pressure, or shortness of breath.  He has been followed closely by Dr. Caryl Comes for polymorphic VT and atrial fibrillation.  He had some problems with epistaxis and ultimately he reduced apixaban to 2.5 mg twice daily with resolution of his bleeding symptoms.  The patient's LVEF is consistently been in the 30 to 40% range.  The patient is also been found to have significant vascular disease with history of amaurosis fugax found to have severe right internal carotid stenosis treated with endarterectomy.  He was incidentally found to have an abdominal aortic aneurysm and has elected for close imaging follow-up under the care of Dr. Donzetta Matters.  Today he reports no recent symptoms of orthopnea, PND, or leg swelling.  He has had no heart palpitations of late.   Past Medical History:  Diagnosis  Date  . Acute on chronic combined systolic (congestive) and diastolic (congestive) heart failure ([place])   . AICD (automatic cardioverter/defibrillator) present    [employer]n PPM/ICD  . Carotid stenosis, right    s/p endarterectomy 11/07/18  . Chronic combined systolic (congestive) and diastolic (congestive) heart failure ([place])   . Coronary artery disease    [facility]  . Diabetes mellitus   . Dysrhythmia   . Endocarditis, valve unspecified    Mitral  . Headache    hx migraines 6-7 x mo  . History of migraine headaches   . History of seasonal allergies   . ICD (implantable cardiac defibrillator) in place   . PAF (paroxysmal atrial fibrillation) ([place])    NO LAA occlusion at the time of surgery  [name] 2018  . Pleural effusion   . PVC's (premature ventricular contractions)   . Ventricular tachycardia, polymorphic ([place])    status post ICD implantation    Past Surgical History:  Procedure Laterality Date  . CARDIAC CATHETERIZATION  04/03/2007   EF 40%  . CARDIAC CATHETERIZATION  02/06/2006   EF 45%. ANTERIOR HYPOKINESIS  . CARDIAC CATHETERIZATION  05/29/2000   EF 50%. MILD ANTERIOR HYPOKINESIS  . CARDIAC CATHETERIZATION  10/25/1999   EF 50%. SEVERE MITRAL REGURGITATION  . CARDIAC CATHETERIZATION  01/06/2003   EF 50%  . CARDIAC DEFIBRILLATOR PLACEMENT    . CARDIOVERSION N/A 02/07/2019   Procedure: CARDIOVERSION;  Surgeon: Fay Records, MD;  Location: [place];  Service: Cardiovascular;  Laterality: N/A;  . CATARACT EXTRACTION W/ INTRAOCULAR LENS  IMPLANT, BILATERAL    . CHOLECYSTECTOMY N/A 01/08/2019   Procedure: LAPAROSCOPIC CHOLECYSTECTOMY WITH INTRAOPERATIVE CHOLANGIOGRAM;  Surgeon: Donnie Mesa, MD;  Location: [place];  Service: General;  Laterality: N/A;  . CORONARY ARTERY BYPASS GRAFT  2001   2 VESSEL CABG AND MITRAL VALVE REPAIR. HE HAD A LIMA GRAFT TO THE LAD AND RADIAL ARTERY  GRAFT TO THE OBTUSE MARGINAL  OF LEFT CIRCUMFLEX  . ENDARTERECTOMY Right  11/07/2018   Procedure: ENDARTERECTOMY CAROTID RIGHT;  Surgeon: Waynetta Sandy, MD;  Location: [place];  Service: Vascular;  Laterality: Right;  . EP IMPLANTABLE DEVICE N/A 11/06/2014   Procedure: ICD Generator Changeout;  Surgeon: Deboraha Sprang, MD;  Location: [place] [facility];  Service: Cardiovascular;  Laterality: N/A;  . ERCP N/A 01/10/2019   Procedure: ENDOSCOPIC RETROGRADE CHOLANGIOPANCREATOGRAPHY (ERCP);  Surgeon: Clarene Essex, MD;  Location: [place];  Service: Endoscopy;  Laterality: N/A;  . FOREIGN BODY REMOVAL Right 06/21/2017   Procedure: FOREIGN BODY REMOVAL ADULT RIGHT RING FINGER;  Surgeon: Leanora Cover, MD;  Location: [place];  Service: Orthopedics;  Laterality: Right;  . HEMORRHOID SURGERY    . HEMORROIDECTOMY    . INTRAVASCULAR PRESSURE WIRE/FFR STUDY N/A 01/05/2017   Procedure: INTRAVASCULAR PRESSURE WIRE/FFR STUDY;  Surgeon: Sherren Mocha, MD;  Location: [place] [facility];  Service: Cardiovascular;  Laterality: N/A;  . KNEE ARTHROSCOPY     RIGHT KNEE  . LAPAROSCOPIC APPENDECTOMY N/A 01/08/2019   Procedure: APPENDECTOMY LAPAROSCOPIC;  Surgeon: Donnie Mesa, MD;  Location: [place];  Service: General;  Laterality: N/A;  . LEFT HEART CATHETERIZATION WITH CORONARY ANGIOGRAM N/A 07/04/2012   Procedure: LEFT HEART CATHETERIZATION WITH CORONARY ANGIOGRAM;  Surgeon: Sherren Mocha, MD;  Location: [facility] CATH [facility];  Service: Cardiovascular;  Laterality: N/A;  . MITRAL VALVE REPLACEMENT     No LAA occlusion at the time of surgery  Karie Soda report 2018  . REMOVAL OF STONES  01/10/2019   Procedure: REMOVAL OF STONES;  Surgeon: Clarene Essex, MD;  Location: [place];  Service: Endoscopy;;  . RIGHT/LEFT HEART CATH AND CORONARY/GRAFT ANGIOGRAPHY N/A 01/05/2017   Procedure: RIGHT/LEFT HEART CATH AND CORONARY/GRAFT ANGIOGRAPHY;  Surgeon: Sherren Mocha, MD;  Location: [place] CV [facility];  Service: Cardiovascular;  Laterality: N/A;  . SPHINCTEROTOMY  01/10/2019   Procedure:  SPHINCTEROTOMY;  Surgeon: Clarene Essex, MD;  Location: [facility] ENDOSCOPY;  Service: Endoscopy;;  . TRANSTHORACIC ECHOCARDIOGRAM  04/02/2007   EF 35%    Current Medications: Current Meds  Medication Sig  . acetaminophen (TYLENOL) 500 MG tablet Take 2 tablets (1,000 mg total) by mouth every 8 (eight) hours as needed for mild pain, moderate pain or headache.  Marland Kitchen apixaban (ELIQUIS) 5 MG TABS tablet Take 2.5 mg by mouth in the morning and at bedtime.  Marland Kitchen atorvastatin (LIPITOR) 80 MG tablet Take 1 tablet (80 mg total) by mouth at bedtime. Hold until liver function has normalized.  . Cholecalciferol (VITAMIN D) 125 MCG (5000 UT) CAPS Take 5,000-10,000 Units by mouth See admin instructions. Take 10000 units by mouth on Monday, Wednesday and Friday and take 5000 units on Tuesday, Thursday, Saturday and Sunday  . dofetilide ([name]) 500 MCG capsule Take 1 capsule (500 mcg total) by mouth 2 (two) times daily.  Marland Kitchen EPIPEN 2-PAK 0.3 MG/0.3ML SOAJ injection Inject 0.3 mg into the muscle daily as needed for anaphylaxis.   . fluticasone (FLONASE) 50 MCG/ACT nasal spray Place 1 spray into the  nose daily as needed for rhinitis or allergies.  . furosemide (LASIX) 20 MG tablet Take 20 mg by mouth daily as needed for fluid.  . Magnesium 250 MG TABS Take 250 mg by mouth daily.   . metoprolol succinate (TOPROL-XL) 50 MG 24 hr tablet Take 50 mg by mouth daily. Take with or immediately following a meal.  . nateglinide (STARLIX) 120 MG tablet Take 120 mg by mouth 2 (two) times daily before a meal.   . pioglitazone (ACTOS) 45 MG tablet Take 45 mg by mouth daily.    . sitaGLIPtin (JANUVIA) 100 MG tablet Take 100 mg by mouth at bedtime.   . Tiotropium Bromide-Olodaterol (STIOLTO RESPIMAT) 2.5-2.5 MCG/ACT AERS Inhale 2 puffs into the lungs daily.     Allergies:   Bee venom, Latex, Metformin, Rivaroxaban, Sotalol, and Warfarin sodium   Social History   Socioeconomic History  . Marital status: Married    Spouse name: Mardene Celeste    . Number of children: 2  . Years of education: Not on file  . Highest education level: Associate degree: academic program  Occupational History  . Occupation: retired  Tobacco Use  . Smoking status: Former Smoker    Packs/day: 2.00    Years: 16.00    Pack years: 32.00    Types: Cigarettes    Start date: 02/23/1956    Quit date: 01/31/1971    Years since quitting: 48.3  . Smokeless tobacco: Never Used  Substance and Sexual Activity  . Alcohol use: Yes    Alcohol/week: 0.0 standard drinks    Comment: 1-2 a week  . Drug use: No  . Sexual activity: Yes  Other Topics Concern  . Not on file  Social History Narrative   Patient is right-handed. He lives with his wife ina 2 story home. He drinks one cup of coffee and a diet coke a day.    Social Determinants of Health   Financial Resource Strain:   . Difficulty of Paying Living Expenses:   Food Insecurity:   . Worried About Charity fundraiser in the Last Year:   . Arboriculturist in the Last Year:   Transportation Needs:   . Film/video editor (Medical):   Marland Kitchen Lack of Transportation (Non-Medical):   Physical Activity:   . Days of Exercise per Week:   . Minutes of Exercise per Session:   Stress:   . Feeling of Stress :   Social Connections:   . Frequency of Communication with Friends and Family:   . Frequency of Social Gatherings with Friends and Family:   . Attends Religious Services:   . Active Member of Clubs or Organizations:   . Attends Archivist Meetings:   Marland Kitchen Marital Status:      Family History: The patient's family history includes Cancer in his sister and another family member; Diabetes in an other family member; Emphysema in his father; Heart disease in his father, paternal grandfather, and paternal grandmother; Stroke in his mother.  ROS:   Please see the history of present illness.    All other systems reviewed and are negative.  EKGs/Labs/Other Studies Reviewed:    The following studies were  reviewed today: Echo 11-05-2018: IMPRESSIONS    1. Left ventricular ejection fraction, by visual estimation, is 35 to  40%. The left ventricle has moderately decreased function. There is global  left ventricular hypokinesis and marked apical-basal and septal-lateral  dyssynchrony. There appears to be  apical akinesis.  2. Abnormal  septal motion consistent with RV pacemaker.  3. Global right ventricle has moderately reduced systolic function.The  right ventricular size is normal. No increase in right ventricular wall  thickness.  4. Left atrial size was severely dilated.  5. Right atrial size was moderately dilated.  6. Mild mitral valve regurgitation. Mild-moderate mitral stenosis. Peak  and mean gradients are 16 and 5 mm Hg, respectively, at 70 bpm. Estimated  valve area is 1.4 cm sq by the PHT method.  7. Moderate calcification of the mitral valve leaflet(s).  8. Severe mitral annular calcification.  9. Severe thickening of the mitral valve leaflet(s).  10. The tricuspid valve is normal in structure. Tricuspid valve  regurgitation is mild-moderate.  11. The aortic valve is normal in structure. Aortic valve regurgitation  was not visualized by color flow Doppler.  12. The pulmonic valve was normal in structure. Pulmonic valve  regurgitation is mild by color flow Doppler.  13. Mildly elevated pulmonary artery systolic pressure.  14. A pacer wire is visualized.  15. The inferior vena cava is dilated in size with >50% respiratory  variability, suggesting right atrial pressure of 8 mmHg.  16. Left ventricular diastolic function could not be evaluated due to  atrial fibrillation and mitral stenosis.  55. Compared to February 2020, mitral valve gradients are worse, probably  due to new atrial fibrillation and increased ventricular rate.   EKG:  EKG is ordered today.  The ekg ordered today demonstrates AV sequential pacing 64 bpm  Recent Labs: 01/03/2019: B Natriuretic  Peptide 295.0 01/08/2019: TSH 2.274 01/14/2019: ALT 90; Magnesium 1.9 01/28/2019: Platelets 233 02/07/2019: BUN 12; Creatinine, Ser 0.90; Hemoglobin 13.6; Potassium 4.3; Sodium 136  Recent Lipid Panel    Component Value Date/Time   CHOL 110 11/05/2018 0256   TRIG 90 11/05/2018 0256   HDL 48 11/05/2018 0256   CHOLHDL 2.3 11/05/2018 0256   VLDL 18 11/05/2018 0256   LDLCALC 44 11/05/2018 0256    Physical Exam:    VS:  BP 108/66   Pulse 64   Ht 6\' 1"  (1.854 m)   Wt 208 lb (94.3 kg)   SpO2 100%   BMI 27.44 kg/m     Wt Readings from Last 3 Encounters:  05/12/19 208 lb (94.3 kg)  04/23/19 210 lb (95.3 kg)  02/28/19 200 lb (90.7 kg)     GEN:  Well nourished, well developed in no acute distress HEENT: Normal NECK: No JVD; No carotid bruits LYMPHATICS: No lymphadenopathy CARDIAC: RRR, no murmurs, rubs, gallops RESPIRATORY:  Clear to auscultation without rales, wheezing or rhonchi  ABDOMEN: Soft, non-tender, non-distended MUSCULOSKELETAL:  No edema; No deformity  SKIN: Warm and dry NEUROLOGIC:  Alert and oriented x 3 PSYCHIATRIC:  Normal affect   ASSESSMENT:    1. Coronary artery disease involving coronary bypass graft of native heart without angina pectoris   2. Chronic systolic heart failure (HCC)   3. Paroxysmal atrial fibrillation ([place])   4. Mixed hyperlipidemia    PLAN:    In order of problems listed above:  1. Stable without symptoms of angina.  Most recent cardiac catheterization data reviewed.  Treated with apixaban alone not on antiplatelet therapy.  Treated with a high intensity statin drug. 2. Appears stable without significant functional limitation, euvolemic on exam.  Medical therapy is very limited because of low blood pressure.  Continue metoprolol succinate. 3. 80 sequentially paced today.  On reduced dose apixaban because of bleeding complications.  Overall stable.  Followed closely by Dr.  Klein. 4. LDL cholesterol is 44 mg/dL.   Medication  Adjustments/Labs and Tests Ordered: Current medicines are reviewed at length with the patient today.  Concerns regarding medicines are outlined above.  Orders Placed This Encounter  Procedures  . EKG 12-Lead   No orders of the defined types were placed in this encounter.   Patient Instructions  Medication Instructions:  Your provider recommends that you continue on your current medications as directed. Please refer to the Current Medication list given to you today.   *If you need a refill on your cardiac medications before your next appointment, please call your pharmacy*  Follow-Up: At [facility], you and your health needs are our priority.  As part of our continuing mission to provide you with exceptional heart care, we have created designated Provider Care Teams.  These Care Teams include your primary Cardiologist (physician) and Advanced Practice Providers (APPs -  Physician Assistants and Nurse Practitioners) who all work together to provide you with the care you need, when you need it. Your next appointment:   12 month(s) The format for your next appointment:   In Person Provider:   You may see Sherren Mocha, MD or one of the following Advanced Practice Providers on your designated Care Team:    Richardson Dopp, PA-C  Vin Tolley, PA-C  Daune Perch, [place]      Signed, Sherren Mocha, MD  05/12/2019 1:07 PM    [place]
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
## 2019-05-14 DIAGNOSIS — I2510 Atherosclerotic heart disease of native coronary artery without angina pectoris: Secondary | ICD-10-CM | POA: Diagnosis not present

## 2019-05-14 DIAGNOSIS — I480 Paroxysmal atrial fibrillation: Secondary | ICD-10-CM | POA: Diagnosis not present

## 2019-05-14 DIAGNOSIS — I10 Essential (primary) hypertension: Secondary | ICD-10-CM | POA: Diagnosis not present

## 2019-05-14 DIAGNOSIS — I255 Ischemic cardiomyopathy: Secondary | ICD-10-CM | POA: Diagnosis not present

## 2019-05-14 DIAGNOSIS — E871 Hypo-osmolality and hyponatremia: Secondary | ICD-10-CM | POA: Diagnosis not present

## 2019-05-14 DIAGNOSIS — Z95810 Presence of automatic (implantable) cardiac defibrillator: Secondary | ICD-10-CM | POA: Diagnosis not present

## 2019-05-15 ENCOUNTER — Other Ambulatory Visit: Payer: Self-pay

## 2019-05-15 ENCOUNTER — Ambulatory Visit (INDEPENDENT_AMBULATORY_CARE_PROVIDER_SITE_OTHER): Payer: Medicare Other | Admitting: Neurology

## 2019-05-15 ENCOUNTER — Encounter: Payer: Self-pay | Admitting: Neurology

## 2019-05-15 VITALS — BP 109/74 | HR 92 | Ht 73.0 in | Wt 209.6 lb

## 2019-05-15 DIAGNOSIS — I6521 Occlusion and stenosis of right carotid artery: Secondary | ICD-10-CM | POA: Diagnosis not present

## 2019-05-15 DIAGNOSIS — I25118 Atherosclerotic heart disease of native coronary artery with other forms of angina pectoris: Secondary | ICD-10-CM

## 2019-05-15 DIAGNOSIS — G453 Amaurosis fugax: Secondary | ICD-10-CM | POA: Diagnosis not present

## 2019-05-15 DIAGNOSIS — E785 Hyperlipidemia, unspecified: Secondary | ICD-10-CM

## 2019-05-15 DIAGNOSIS — E118 Type 2 diabetes mellitus with unspecified complications: Secondary | ICD-10-CM | POA: Diagnosis not present

## 2019-05-15 DIAGNOSIS — G43109 Migraine with aura, not intractable, without status migrainosus: Secondary | ICD-10-CM | POA: Diagnosis not present

## 2019-05-15 NOTE — Patient Instructions (Signed)
No change in management at this time

## 2019-06-05 ENCOUNTER — Telehealth: Payer: Self-pay | Admitting: Neurology

## 2019-06-05 NOTE — Telephone Encounter (Signed)
Janett Billow at [employer] called requesting a PA for Aimovig 70 MG.   Fax: [phone]

## 2019-06-06 ENCOUNTER — Other Ambulatory Visit: Payer: Self-pay

## 2019-06-06 MED ORDER — AIMOVIG 70 MG/ML ~~LOC~~ SOAJ: 70.0000 mg | SUBCUTANEOUS | 5 refills | Status: DC

## 2019-06-06 NOTE — Telephone Encounter (Signed)
He told me at his last visit that he was no longer on Aimovig because it was ineffective.

## 2019-06-06 NOTE — Telephone Encounter (Signed)
Telephone call to pt, Pt wants to continue on [place]. Pt states during his visit Dr. Tomi Likens say he didn't try it long enough. So he will try and see for the next couple of months.  PA Will start it today.  If DR. Tomi Likens approves of the script.

## 2019-06-06 NOTE — Progress Notes (Signed)
[name] (Key: [id number]) Aimovig 70MG /ML auto-injectors Status: PA Request  Created: May 7th, 2021  Sent: May 7th, 2021

## 2019-06-06 NOTE — Telephone Encounter (Signed)
PA started for pt.

## 2019-06-06 NOTE — Telephone Encounter (Signed)
Dr. Tomi Likens I did not see  A new script in this pt chart for Aimovig. Should he continue on this??

## 2019-06-06 NOTE — Telephone Encounter (Signed)
Yes.  Restart Aimovig 70mg  every 30 days.

## 2019-06-09 NOTE — Progress Notes (Signed)
PA Approval letter received. Med's approved until 09/12/19.

## 2019-06-11 ENCOUNTER — Other Ambulatory Visit: Payer: Self-pay | Admitting: Internal Medicine

## 2019-06-11 NOTE — Telephone Encounter (Signed)
Prescription refill request for Eliquis received.  Last office visit: Burt Knack, 05/12/2019 Scr: 01/28/2019, 1.00 Age: 86 y.o. Weight: 95.1 kg  Prescription refill sent.

## 2019-07-01 DIAGNOSIS — R040 Epistaxis: Secondary | ICD-10-CM | POA: Diagnosis not present

## 2019-07-21 ENCOUNTER — Encounter: Payer: Self-pay | Admitting: Neurology

## 2019-07-21 NOTE — Progress Notes (Signed)
[employer] rep called wanting the PA letter of approval faxed to them. I faxed letter to [phone] per call. Approval on file until 09/12/19. Successful fax confirmation received.

## 2019-07-25 NOTE — Progress Notes (Signed)
Received fax letter from [employer] stating that the patient was eligible and approved for the AIMOVIG assistance. Eligibility end date: 01/30/20.

## 2019-07-29 DIAGNOSIS — D72819 Decreased white blood cell count, unspecified: Secondary | ICD-10-CM | POA: Diagnosis not present

## 2019-07-29 DIAGNOSIS — D696 Thrombocytopenia, unspecified: Secondary | ICD-10-CM | POA: Diagnosis not present

## 2019-07-30 ENCOUNTER — Other Ambulatory Visit: Payer: Self-pay | Admitting: Internal Medicine

## 2019-08-08 ENCOUNTER — Ambulatory Visit (INDEPENDENT_AMBULATORY_CARE_PROVIDER_SITE_OTHER): Payer: Medicare Other | Admitting: *Deleted

## 2019-08-08 DIAGNOSIS — I472 Ventricular tachycardia, unspecified: Secondary | ICD-10-CM

## 2019-08-08 LAB — CUP PACEART REMOTE DEVICE CHECK
Battery Remaining Longevity: 29 mo
Battery Voltage: 2.94 V
Brady Statistic AP VP Percent: 78.89 %
Brady Statistic AP VS Percent: 0.77 %
Brady Statistic AS VP Percent: 15.75 %
Brady Statistic AS VS Percent: 4.59 %
Brady Statistic RA Percent Paced: 77.63 %
Brady Statistic RV Percent Paced: 93.03 %
Date Time Interrogation Session: 20210709001703
HighPow Impedance: 38 Ohm
HighPow Impedance: 53 Ohm
Implantable Lead Implant Date: 20040309
Implantable Lead Implant Date: 20040309
Implantable Lead Location: 753859
Implantable Lead Location: 753860
Implantable Lead Model: 158
Implantable Lead Model: 5076
Implantable Lead Serial Number: 115061
Implantable Pulse Generator Implant Date: 20161007
Lead Channel Impedance Value: 399 Ohm
Lead Channel Impedance Value: 399 Ohm
Lead Channel Impedance Value: 4047 Ohm
Lead Channel Impedance Value: 4047 Ohm
Lead Channel Impedance Value: 4047 Ohm
Lead Channel Impedance Value: 4047 Ohm
Lead Channel Impedance Value: 4047 Ohm
Lead Channel Impedance Value: 4047 Ohm
Lead Channel Impedance Value: 4047 Ohm
Lead Channel Impedance Value: 4047 Ohm
Lead Channel Impedance Value: 4047 Ohm
Lead Channel Impedance Value: 4047 Ohm
Lead Channel Impedance Value: 418 Ohm
Lead Channel Pacing Threshold Amplitude: 0.5 V
Lead Channel Pacing Threshold Amplitude: 0.625 V
Lead Channel Pacing Threshold Pulse Width: 0.4 ms
Lead Channel Pacing Threshold Pulse Width: 0.4 ms
Lead Channel Sensing Intrinsic Amplitude: 0.875 mV
Lead Channel Sensing Intrinsic Amplitude: 0.875 mV
Lead Channel Sensing Intrinsic Amplitude: 6.875 mV
Lead Channel Sensing Intrinsic Amplitude: 6.875 mV
Lead Channel Setting Pacing Amplitude: 2 V
Lead Channel Setting Pacing Amplitude: 2.5 V
Lead Channel Setting Pacing Pulse Width: 0.4 ms
Lead Channel Setting Sensing Sensitivity: 0.3 mV

## 2019-08-12 NOTE — Progress Notes (Signed)
Remote ICD transmission.   

## 2019-08-14 ENCOUNTER — Other Ambulatory Visit: Payer: Self-pay

## 2019-08-14 ENCOUNTER — Encounter: Payer: Self-pay | Admitting: Neurology

## 2019-08-14 ENCOUNTER — Encounter (INDEPENDENT_AMBULATORY_CARE_PROVIDER_SITE_OTHER): Payer: Self-pay | Admitting: Ophthalmology

## 2019-08-14 ENCOUNTER — Ambulatory Visit (INDEPENDENT_AMBULATORY_CARE_PROVIDER_SITE_OTHER): Payer: Medicare Other | Admitting: Ophthalmology

## 2019-08-14 DIAGNOSIS — H47021 Hemorrhage in optic nerve sheath, right eye: Secondary | ICD-10-CM

## 2019-08-14 DIAGNOSIS — H43813 Vitreous degeneration, bilateral: Secondary | ICD-10-CM

## 2019-08-14 DIAGNOSIS — E113393 Type 2 diabetes mellitus with moderate nonproliferative diabetic retinopathy without macular edema, bilateral: Secondary | ICD-10-CM | POA: Diagnosis not present

## 2019-08-14 DIAGNOSIS — H35043 Retinal micro-aneurysms, unspecified, bilateral: Secondary | ICD-10-CM

## 2019-08-14 HISTORY — DX: Hemorrhage in optic nerve sheath, right eye: H47.021

## 2019-08-14 NOTE — Assessment & Plan Note (Signed)
The nature of moderate nonproliferative diabetic retinopathy was discussed with the patient as well as the need for more frequent follow up to judge for progression. Good blood glucose, blood pressure, and serum lipid control was recommended as well as avoidance of smoking and maintenance of normal weight.  Close follow up with PCP encouraged. °

## 2019-08-14 NOTE — Progress Notes (Signed)
08/14/2019     CHIEF COMPLAINT Patient presents for Retina Follow Up   HISTORY OF PRESENT ILLNESS: [name] is a 86 y.o. male who presents to the clinic today for:   HPI    Retina Follow Up    Patient presents with  Other.  In both eyes.  Duration of 1 year.  Since onset it is stable.          Comments    1 year follow up - OCT OU Patient denies change in vision and overall has no complaints. LBS 125 /// A1C 6.4  Last visit October 2020 patient was seen with amaurosis fugax.  The patient was referred immediately to [facility] emergency room whereupon 99% occlusion of the right carotid artery was discovered.  Subsequently underwent carotid endarterectomy some 2 days later.  Since that time patient had multiple medical issues including ventricular tachycardia, abdominal aortic aneurysm all of these conditions have resolved and been treated       Last edited by Hurman Horn, MD on 08/14/2019  1:40 PM. (History)      Referring physician: Sueanne Margarita, DO [place],   [id number]  HISTORICAL INFORMATION:   Selected notes from the MEDICAL RECORD NUMBER    Lab Results  Component Value Date   HGBA1C 6.8 (H) 11/04/2018     CURRENT MEDICATIONS: No current outpatient medications on file. (Ophthalmic Drugs)   No current facility-administered medications for this visit. (Ophthalmic Drugs)   Current Outpatient Medications (Other)  Medication Sig  . acetaminophen (TYLENOL) 500 MG tablet Take 2 tablets (1,000 mg total) by mouth every 8 (eight) hours as needed for mild pain, moderate pain or headache.  Marland Kitchen atorvastatin (LIPITOR) 80 MG tablet Take 1 tablet (80 mg total) by mouth at bedtime. Hold until liver function has normalized.  . Cholecalciferol (VITAMIN D) 125 MCG (5000 UT) CAPS Take 5,000-10,000 Units by mouth See admin instructions. Take 10000 units by mouth on Monday, Wednesday and Friday and take 5000 units on Tuesday, Thursday, Saturday and Sunday   . dofetilide ([name]) 500 MCG capsule Take 1 capsule (500 mcg total) by mouth 2 (two) times daily.  Marland Kitchen ELIQUIS 5 MG TABS tablet TAKE ONE TABLET BY MOUTH TWICE DAILY  . EPIPEN 2-PAK 0.3 MG/0.3ML SOAJ injection Inject 0.3 mg into the muscle daily as needed for anaphylaxis.   Eduard Roux (AIMOVIG) 70 MG/ML SOAJ Inject 70 mg into the skin every 30 (thirty) days.  . fluticasone (FLONASE) 50 MCG/ACT nasal spray Place 1 spray into the nose daily as needed for rhinitis or allergies.  . furosemide (LASIX) 20 MG tablet Take 20 mg by mouth daily as needed for fluid.  . Magnesium 250 MG TABS Take 250 mg by mouth daily.   . metoprolol succinate (TOPROL-XL) 50 MG 24 hr tablet Take 50 mg by mouth daily. Take with or immediately following a meal.  . nateglinide (STARLIX) 120 MG tablet Take 120 mg by mouth 2 (two) times daily before a meal.   . pioglitazone (ACTOS) 45 MG tablet Take 45 mg by mouth daily.    . sitaGLIPtin (JANUVIA) 100 MG tablet Take 100 mg by mouth at bedtime.   . Tiotropium Bromide-Olodaterol (STIOLTO RESPIMAT) 2.5-2.5 MCG/ACT AERS Inhale 2 puffs into the lungs daily.   No current facility-administered medications for this visit. (Other)      REVIEW OF SYSTEMS:    ALLERGIES Allergies  Allergen Reactions  . Bee Venom Anaphylaxis  .  Latex Other (See Comments)    REDNESS AT REACTION SITE.  Marland Kitchen Metformin Diarrhea  . Rivaroxaban Other (See Comments)    Headaches, blurred vision  . Sotalol Other (See Comments)    "BLUE TOES"- cut his circulation off  . Warfarin Sodium Other (See Comments)    REACTION: migraine headaches/vision impariment    PAST MEDICAL HISTORY Past Medical History:  Diagnosis Date  . Acute on chronic combined systolic (congestive) and diastolic (congestive) heart failure ([place])   . AICD (automatic cardioverter/defibrillator) present    Medtr[employer]n PPM/ICD  . Carotid stenosis, right    s/p endarterectomy 11/07/18  . Chronic combined systolic (congestive) and  diastolic (congestive) heart failure ([place])   . Coronary artery disease    [facility]  [facility]  . Diabetes mellitus   . Dysrhythmia   . Endocarditis, valve unspecified    Mitral  . Headache    hx migraines 6-7 x mo  . History of migraine headaches   . History of seasonal allergies   . ICD (implantable cardiac defibrillator) in place   . PAF (paroxysmal atrial fibrillation) ([place])    NO LAA occlusion at the time of surgery  [name] 2018  . Pleural effusion   . PVC's (premature ventricular contractions)   . Ventricular tachycardia, polymorphic ([place])    status post ICD implantation   Past Surgical History:  Procedure Laterality Date  . CARDIAC CATHETERIZATION  04/03/2007   EF 40%  . CARDIAC CATHETERIZATION  02/06/2006   EF 45%. ANTERIOR HYPOKINESIS  . CARDIAC CATHETERIZATION  05/29/2000   EF 50%. MILD ANTERIOR HYPOKINESIS  . CARDIAC CATHETERIZATION  10/25/1999   EF 50%. SEVERE MITRAL REGURGITATION  . CARDIAC CATHETERIZATION  01/06/2003   EF 50%  . CARDIAC DEFIBRILLATOR PLACEMENT    . CARDIOVERSION N/A 02/07/2019   Procedure: CARDIOVERSION;  Surgeon: Fay Records, MD;  Location: [place];  Service: Cardiovascular;  Laterality: N/A;  . CATARACT EXTRACTION W/ INTRAOCULAR LENS  IMPLANT, BILATERAL    . CHOLECYSTECTOMY N/A 01/08/2019   Procedure: LAPAROSCOPIC CHOLECYSTECTOMY WITH INTRAOPERATIVE CHOLANGIOGRAM;  Surgeon: Donnie Mesa, MD;  Location: [place];  Service: General;  Laterality: N/A;  . CORONARY ARTERY BYPASS GRAFT  2001   2 VESSEL CABG AND MITRAL VALVE REPAIR. HE HAD A LIMA GRAFT TO THE LAD AND RADIAL ARTERY  GRAFT TO THE OBTUSE MARGINAL  OF LEFT CIRCUMFLEX  . ENDARTERECTOMY Right 11/07/2018   Procedure: ENDARTERECTOMY CAROTID RIGHT;  Surgeon: Waynetta Sandy, MD;  Location: [place];  Service: Vascular;  Laterality: Right;  . EP IMPLANTABLE DEVICE N/A 11/06/2014   Procedure: ICD Generator Changeout;  Surgeon: Deboraha Sprang, MD;  Location: [place] CV [facility];   Service: Cardiovascular;  Laterality: N/A;  . ERCP N/A 01/10/2019   Procedure: ENDOSCOPIC RETROGRADE CHOLANGIOPANCREATOGRAPHY (ERCP);  Surgeon: Clarene Essex, MD;  Location: [place];  Service: Endoscopy;  Laterality: N/A;  . FOREIGN BODY REMOVAL Right 06/21/2017   Procedure: FOREIGN BODY REMOVAL ADULT RIGHT RING FINGER;  Surgeon: Leanora Cover, MD;  Location: [place];  Service: Orthopedics;  Laterality: Right;  . HEMORRHOID SURGERY    . HEMORROIDECTOMY    . INTRAVASCULAR PRESSURE WIRE/FFR STUDY N/A 01/05/2017   Procedure: INTRAVASCULAR PRESSURE WIRE/FFR STUDY;  Surgeon: Sherren Mocha, MD;  Location: [place] [facility];  Service: Cardiovascular;  Laterality: N/A;  . KNEE ARTHROSCOPY     RIGHT KNEE  . LAPAROSCOPIC APPENDECTOMY N/A 01/08/2019   Procedure: APPENDECTOMY LAPAROSCOPIC;  Surgeon: Donnie Mesa, MD;  Location: [place];  Service: General;  Laterality: N/A;  . LEFT HEART CATHETERIZATION WITH CORONARY ANGIOGRAM N/A 07/04/2012   Procedure: LEFT HEART CATHETERIZATION WITH CORONARY ANGIOGRAM;  Surgeon: Sherren Mocha, MD;  Location: [facility] CATH LAB;  Service: Cardiovascular;  Laterality: N/A;  . MITRAL VALVE REPLACEMENT     No LAA occlusion at the time of surgery  Karie Soda report 2018  . REMOVAL OF STONES  01/10/2019   Procedure: REMOVAL OF STONES;  Surgeon: Clarene Essex, MD;  Location: [place];  Service: Endoscopy;;  . RIGHT/LEFT HEART CATH AND CORONARY/GRAFT ANGIOGRAPHY N/A 01/05/2017   Procedure: RIGHT/LEFT HEART CATH AND CORONARY/GRAFT ANGIOGRAPHY;  Surgeon: Sherren Mocha, MD;  Location: [place] [facility];  Service: Cardiovascular;  Laterality: N/A;  . SPHINCTEROTOMY  01/10/2019   Procedure: SPHINCTEROTOMY;  Surgeon: Clarene Essex, MD;  [facility] END[facility]COPY;  Service: Endoscopy;;  . TRANSTHORACIC ECHOCARDIOGRAM  04/02/2007   EF 35%    FAMILY HISTORY Family History  Problem Relation Age of Onset  . Heart disease Father   . Emphysema Father   . Stroke Mother   . Cancer Sister         breast cancer  . Heart disease Paternal Grandmother   . Heart disease Paternal Grandfather   . Diabetes Other        grandmother, aunt, uncle  . Cancer Other        lung cancer, aunt    SOCIAL HISTORY Social History   Tobacco Use  . Smoking status: Former Smoker    Packs/day: 2.00    Years: 16.00    Pack years: 32.00    Types: Cigarettes    Start date: 02/23/1956    Quit date: 01/31/1971    Years since quitting: 48.5  . Smokeless tobacco: Never Used  Vaping Use  . Vaping Use: Never used  Substance Use Topics  . Alcohol use: Yes    Alcohol/week: 0.0 standard drinks    Comment: 1-2 a week  . Drug use: No         OPHTHALMIC EXAM:  Base Eye Exam    Visual Acuity (Snellen - Linear)      Right Left   Dist cc 20/20-2 20/25-2       Tonometry (Tonopen, 1:12 PM)      Right Left   Pressure 13 13       Pupils      Pupils Dark Light Shape React APD   Right PERRL 5 4 Round Slow None   Left PERRL 5 4 Round Slow None       Visual Fields (Counting fingers)      Left Right    Full Full       Extraocular Movement      Right Left    Full Full       Neuro/Psych    Oriented x3: Yes   Mood/Affect: Normal        Slit Lamp and Fundus Exam    External Exam      Right Left   External Normal Normal       Slit Lamp Exam      Right Left   Lids/Lashes Normal Normal   Conjunctiva/Sclera White and quiet White and quiet   Cornea Clear Clear   Anterior Chamber Deep and quiet Deep and quiet   Iris Round and reactive Round and reactive   Lens Posterior chamber intraocular lens Posterior chamber intraocular lens   Anterior Vitreous Normal Normal       Fundus Exam  Right Left   Posterior Vitreous Posterior vitreous detachment Posterior vitreous detachment   Disc Hemorrhage at inferior pole Normal   C/D Ratio 0.35 0.5   Macula ,, Microaneurysms, no macular thickening, no clinically significant macular edema ,, Microaneurysms, no macular thickening, no clinically  significant macular edema   Vessels NPDR- Moderate NPDR- Moderate   Periphery [place] degeneration, Pigmentation           IMAGING AND PROCEDURES  Imaging and Procedures for 08/14/19  OCT, Retina - OU - Both Eyes       Right Eye Quality was good. Scan locations included subfoveal. Central Foveal Thickness: 287. Progression has been stable. Findings include abnormal foveal contour, retinal drusen .   Left Eye Quality was good. Scan locations included subfoveal. Central Foveal Thickness: 278. Progression has been stable. Findings include abnormal foveal contour, retinal drusen .   Notes OU with no active CSME       Color Fundus Photography Optos - OU - Both Eyes       Right Eye Progression has been stable. Disc findings include hemorrhage. Macula : normal observations. Vessels : normal observations. Periphery : RPE abnormality.   Left Eye Progression has been stable. Disc findings include normal observations. Macula : normal observations. Vessels : normal observations. Periphery : RPE abnormality.   Notes OD, with new splinter hemorrhage along the inferotemporal margin of the optic nerve.  Moderate nonproliferative diabetic retinopathy with otherwise reticulated RPE degeneration in the mid periphery, OU                ASSESSMENT/PLAN:  Moderate nonproliferative diabetic retinopathy of both eyes (HCC) The nature of moderate nonproliferative diabetic retinopathy was discussed with the patient as well as the need for more frequent follow up to judge for progression. Good blood glucose, blood pressure, and serum lipid control was recommended as well as avoidance of smoking and maintenance of normal weight.  Close follow up with PCP encouraged.      ICD-10-CM   1. Moderate nonproliferative diabetic retinopathy of both eyes associated with type 2 diabetes mellitus, macular edema presence unspecified (HCC)  O97.3532 OCT, Retina - OU - Both Eyes    Color Fundus  Photography Optos - OU - Both Eyes  2. Posterior vitreous detachment of both eyes  H43.813   3. Retinal microaneurysm of both eyes  H35.043 OCT, Retina - OU - Both Eyes    Color Fundus Photography Optos - OU - Both Eyes  4. Optic nerve hemorrhage, right  H47.021     1.  2.  3.  Ophthalmic Meds Ordered this visit:  No orders of the defined types were placed in this encounter.      Return in about 6 months (around 02/14/2020) for DILATE OU, COLOR FP.  There are no Patient Instructions on file for this visit.   Explained the diagnoses, plan, and follow up with the patient and they expressed understanding.  Patient expressed understanding of the importance of proper follow up care.   Clent Demark Rafe Mackowski M.D. Diseases & Surgery of the Retina and Vitreous Retina & Diabetic [place] 08/14/19     Abbreviations: M myopia (nearsighted); A astigmatism; H hyperopia (farsighted); P presbyopia; Mrx spectacle prescription;  CTL contact lenses; OD right eye; OS left eye; OU both eyes  XT exotropia; ET esotropia; PEK punctate epithelial keratitis; PEE punctate epithelial erosions; DES dry eye syndrome; MGD meibomian gland dysfunction; ATs artificial tears; PFAT's preservative free artificial tears; [place] nuclear sclerotic cataract; PSC posterior  subcapsular cataract; ERM epi-retinal membrane; PVD posterior vitreous detachment; RD retinal detachment; DM diabetes mellitus; DR diabetic retinopathy; NPDR non-proliferative diabetic retinopathy; PDR proliferative diabetic retinopathy; CSME clinically significant macular edema; DME diabetic macular edema; dbh dot blot hemorrhages; CWS cotton wool spot; POAG primary open angle glaucoma; C/D cup-to-disc ratio; HVF humphrey visual field; GVF goldmann visual field; OCT optical coherence tomography; IOP intraocular pressure; BRVO Branch retinal vein occlusion; CRVO central retinal vein occlusion; CRAO central retinal artery occlusion; BRAO branch retinal artery  occlusion; RT retinal tear; SB scleral buckle; PPV pars plana vitrectomy; VH Vitreous hemorrhage; PRP panretinal laser photocoagulation; IVK intravitreal kenalog; VMT vitreomacular traction; MH Macular hole;  NVD neovascularization of the disc; NVE neovascularization elsewhere; AREDS age related eye disease study; ARMD age related macular degeneration; POAG primary open angle glaucoma; EBMD epithelial/anterior basement membrane dystrophy; ACIOL anterior chamber intraocular lens; IOL intraocular lens; PCIOL posterior chamber intraocular lens; Phaco/IOL phacoemulsification with intraocular lens placement; [place] photorefractive keratectomy; LASIK laser assisted in situ keratomileusis; HTN hypertension; DM diabetes mellitus; COPD chronic obstructive pulmonary disease

## 2019-08-14 NOTE — Progress Notes (Addendum)
[name] (Key: [id number]) Aimovig 70MG /ML auto-injectors   Form [employer] [place] Medicare Part D General Authorization Form Created 3 hours ago Sent to Plan 3 hours ago Plan Response 3 hours ago Submit Clinical Questions 3 hours ago Determination Favorable 1 hour ago Message from Plan Effective from 08/14/2019 through 08/13/2020.

## 2019-08-15 ENCOUNTER — Encounter: Payer: Self-pay | Admitting: Neurology

## 2019-08-15 ENCOUNTER — Telehealth: Payer: Self-pay | Admitting: Neurology

## 2019-08-15 NOTE — Progress Notes (Addendum)
Per after hours msg: Caller from [facility] was calling with approval of the Aimovig medication for this patient that it was valid from 08/14/19 to 08/13/20.   Auth #: [id number]

## 2019-08-15 NOTE — Telephone Encounter (Signed)
Aimovig prior authorization approved by [facility] 08-14-2019 to 08-13-2020

## 2019-08-15 NOTE — Telephone Encounter (Signed)
Left message with after hour service on 08-15-19 @ 12:00pm   Caller states she is calling from [facility]  With and approval for medication   Medication aimovig for approval 08-14-19- 08-13-20  Option 5 when you call

## 2019-08-19 ENCOUNTER — Other Ambulatory Visit: Payer: Self-pay

## 2019-08-19 DIAGNOSIS — I6521 Occlusion and stenosis of right carotid artery: Secondary | ICD-10-CM

## 2019-08-27 DIAGNOSIS — E1159 Type 2 diabetes mellitus with other circulatory complications: Secondary | ICD-10-CM | POA: Diagnosis not present

## 2019-08-27 DIAGNOSIS — I2510 Atherosclerotic heart disease of native coronary artery without angina pectoris: Secondary | ICD-10-CM | POA: Diagnosis not present

## 2019-08-27 DIAGNOSIS — E559 Vitamin D deficiency, unspecified: Secondary | ICD-10-CM | POA: Diagnosis not present

## 2019-08-27 DIAGNOSIS — E782 Mixed hyperlipidemia: Secondary | ICD-10-CM | POA: Diagnosis not present

## 2019-08-29 ENCOUNTER — Ambulatory Visit (INDEPENDENT_AMBULATORY_CARE_PROVIDER_SITE_OTHER): Payer: Medicare Other | Admitting: Physician Assistant

## 2019-08-29 ENCOUNTER — Other Ambulatory Visit: Payer: Self-pay

## 2019-08-29 ENCOUNTER — Ambulatory Visit (HOSPITAL_COMMUNITY)
Admission: RE | Admit: 2019-08-29 | Discharge: 2019-08-29 | Disposition: A | Discharge status: Home / Self Care | Payer: Medicare Other | Source: Ambulatory Visit | Attending: Vascular Surgery | Admitting: Vascular Surgery

## 2019-08-29 VITALS — BP 121/75 | HR 98 | Temp 97.0°F | Resp 20 | Ht 73.0 in | Wt 216.4 lb

## 2019-08-29 DIAGNOSIS — I714 Abdominal aortic aneurysm, without rupture, unspecified: Secondary | ICD-10-CM

## 2019-08-29 DIAGNOSIS — I6521 Occlusion and stenosis of right carotid artery: Secondary | ICD-10-CM | POA: Diagnosis not present

## 2019-08-29 DIAGNOSIS — I6523 Occlusion and stenosis of bilateral carotid arteries: Secondary | ICD-10-CM

## 2019-08-29 DIAGNOSIS — I6529 Occlusion and stenosis of unspecified carotid artery: Secondary | ICD-10-CM | POA: Insufficient documentation

## 2019-08-29 NOTE — Progress Notes (Signed)
Follow

## 2019-08-29 NOTE — Progress Notes (Signed)
Established Carotid Patient   History of Present Illness   [name] is a 86 y.o. (May 19, 1931) male who presents with Tony complaint: Carotid artery stenosis.  He underwent right carotid endarterectomy by Dr. Donzetta Matters on 11/07/2018 due to symptomatic right ICA stenosis with episodes of amaurosis fugax.  He denies any diagnosis of CVA or TIA since last office visit.  He also denies any strokelike symptoms including any further vision changes, slurring speech, or one-sided weakness.  He is on a statin daily.  He is also on Eliquis which is managed by his EP cardiologist Dr. Caryl Comes.  He is also followed for abdominal aortic aneurysm.  As of 12/20 CT demonstrated maximum diameter of 5.7 cm.  He had been offered endovascular repair by Dr. Donzetta Matters in January however at the time patient was caring for his wife who had recently undergone hip and back surgery.  Patient denies any new or changing abdominal or back pain.  He denies any claudication, rest pain, or tissue changes of bilateral lower extremities.  He would like to discuss repair of aneurysm.   The patient's PMH, PSH, SH, and FamHx were reviewed and are unchanged from prior visit.  Current Outpatient Medications  Medication Sig Dispense Refill  . acetaminophen (TYLENOL) 500 MG tablet Take 2 tablets (1,000 mg total) by mouth every 8 (eight) hours as needed for mild pain, moderate pain or headache. 30 tablet 0  . atorvastatin (LIPITOR) 80 MG tablet Take 1 tablet (80 mg total) by mouth at bedtime. Hold until liver function has normalized.    . Cholecalciferol (VITAMIN D) 125 MCG (5000 UT) CAPS Take 5,000-10,000 Units by mouth See admin instructions. Take 10000 units by mouth on Monday, Wednesday and Friday and take 5000 units on Tuesday, Thursday, Saturday and Sunday    . dofetilide ([name]) 500 MCG capsule Take 1 capsule (500 mcg total) by mouth 2 (two) times daily. 180 capsule 3  . ELIQUIS 5 MG TABS tablet TAKE ONE TABLET BY MOUTH TWICE DAILY  180 tablet 1  . EPIPEN 2-PAK 0.3 MG/0.3ML SOAJ injection Inject 0.3 mg into the muscle daily as needed for anaphylaxis.     [name] (AIMOVIG) 70 MG/ML SOAJ Inject 70 mg into the skin every 30 (thirty) days. 1 pen 5  . fluticasone (FLONASE) 50 MCG/ACT nasal spray Place 1 spray into the nose daily as needed for rhinitis or allergies.    . furosemide (LASIX) 20 MG tablet Take 20 mg by mouth daily as needed for fluid.    . Magnesium 250 MG TABS Take 250 mg by mouth daily.     . metoprolol succinate (TOPROL-XL) 50 MG 24 hr tablet Take 50 mg by mouth daily. Take with or immediately following a meal.    . nateglinide (STARLIX) 120 MG tablet Take 120 mg by mouth 2 (two) times daily before a meal.     . pioglitazone (ACTOS) 45 MG tablet Take 45 mg by mouth daily.      . sitaGLIPtin (JANUVIA) 100 MG tablet Take 100 mg by mouth at bedtime.     . Tiotropium Bromide-Olodaterol (STIOLTO RESPIMAT) 2.5-2.5 MCG/ACT AERS Inhale 2 puffs into the lungs daily. 4 g 12   No current facility-administered medications for this visit.    REVIEW OF SYSTEMS (negative unless checked):   Cardiac:  []  Chest pain or chest pressure? []  Shortness of breath upon activity? []  Shortness of breath when lying flat? []  Irregular heart rhythm?  Vascular:  []  Pain in  calf, thigh, or hip brought on by walking? []  Pain in feet at night that wakes you up from your sleep? []  Blood clot in your veins? []  Leg swelling?  Pulmonary:  []  Oxygen at home? []  Productive cough? []  Wheezing?  Neurologic:  []  Sudden weakness in arms or legs? []  Sudden numbness in arms or legs? []  Sudden onset of difficult speaking or slurred speech? []  Temporary loss of vision in one eye? []  Problems with dizziness?  Gastrointestinal:  []  Blood in stool? []  Vomited blood?  Genitourinary:  []  Burning when urinating? []  Blood in urine?  Psychiatric:  []  Major depression  Hematologic:  []  Bleeding problems? []  Problems with blood  clotting?  Dermatologic:  []  Rashes or ulcers?  Constitutional:  []  Fever or chills?  Ear/Nose/Throat:  []  Change in hearing? []  Nose bleeds? []  Sore throat?  Musculoskeletal:  []  Back pain? []  Joint pain? []  Muscle pain?   Physical Examination   Vitals:   08/29/19 1121 08/29/19 1122  BP: 106/71 121/75  Pulse: 98   Resp: 20   Temp: (!) 97 F (36.1 C)   TempSrc: Temporal   SpO2: 96%   Weight: (!) 216 lb 6.4 oz (98.2 kg)   Height: 6\' 1"  (1.854 m)    Body mass index is 28.55 kg/m.  General:  WDWN in NAD; vital signs documented above Gait: Not observed HENT: WNL, normocephalic Pulmonary: normal non-labored breathing , without Rales, rhonchi,  wheezing Cardiac: regular HR Abdomen: soft, NT, no masses Skin: without rashes Vascular Exam/Pulses: Feet are symmetrically warm to touch with good capillary refill Extremities: without ischemic changes, without Gangrene , without cellulitis; without open wounds;  Musculoskeletal: no muscle wasting or atrophy  Neurologic: A&O X 3;  No focal weakness or paresthesias are detected; cranial nerves grossly intact Psychiatric:  The pt has Normal affect.  Non-Invasive Vascular Imaging   B Carotid Duplex (at):   R ICA stenosis:  1-39%  R VA:  patent and antegrade  L ICA stenosis:  1-39%  L VA:  patent and antegrade   Medical Decision Making   [name] is a 86 y.o. male who presents 7 months status post right carotid endarterectomy for symptomatic stenosis   Carotid duplex demonstrates 1 to 39% stenosis of bilateral ICAs  No evidence of recurrent stenosis of right ICA  Recheck carotid duplex in 1 year  Abdominal aortic aneurysm remains asymptomatic; would like to reevaluate size of AAA and have further discussion with Dr. Donzetta Matters about repair  We will recheck CTA abdomen pelvis and patient will follow up with Dr. Donzetta Matters in the next few weeks with results   Dagoberto Ligas PA-C [facility] Office: [facility] MD: Donzetta Matters

## 2019-09-01 ENCOUNTER — Other Ambulatory Visit: Payer: Self-pay

## 2019-09-01 DIAGNOSIS — I714 Abdominal aortic aneurysm, without rupture, unspecified: Secondary | ICD-10-CM

## 2019-09-03 DIAGNOSIS — E782 Mixed hyperlipidemia: Secondary | ICD-10-CM | POA: Diagnosis not present

## 2019-09-03 DIAGNOSIS — E1159 Type 2 diabetes mellitus with other circulatory complications: Secondary | ICD-10-CM | POA: Diagnosis not present

## 2019-09-03 DIAGNOSIS — I2510 Atherosclerotic heart disease of native coronary artery without angina pectoris: Secondary | ICD-10-CM | POA: Diagnosis not present

## 2019-09-03 DIAGNOSIS — Z95810 Presence of automatic (implantable) cardiac defibrillator: Secondary | ICD-10-CM | POA: Diagnosis not present

## 2019-09-03 DIAGNOSIS — Z8679 Personal history of other diseases of the circulatory system: Secondary | ICD-10-CM | POA: Diagnosis not present

## 2019-09-03 DIAGNOSIS — I480 Paroxysmal atrial fibrillation: Secondary | ICD-10-CM | POA: Diagnosis not present

## 2019-09-03 DIAGNOSIS — E559 Vitamin D deficiency, unspecified: Secondary | ICD-10-CM | POA: Diagnosis not present

## 2019-09-03 DIAGNOSIS — E113393 Type 2 diabetes mellitus with moderate nonproliferative diabetic retinopathy without macular edema, bilateral: Secondary | ICD-10-CM | POA: Diagnosis not present

## 2019-09-04 ENCOUNTER — Telehealth: Payer: Self-pay | Admitting: Physician Assistant

## 2019-09-04 NOTE — Telephone Encounter (Signed)
Received a new hem referral from Dr. Francesco Sor at [facility] for chronic anemia and thrombcytopenia. [name] has been cld and scheduled to see Cassie on 8/11 at 130pm w/labs at 1pm. Pt aware to arrive 15 minutes early.

## 2019-09-09 NOTE — Progress Notes (Signed)
[place] [phone]   [phone]  CONSULT NOTE  REFERRING PHYSICIAN: Dr. Francesco Sor  REASON FOR CONSULTATION:  Bicytopenia  HPI [name] is a 86 y.o. male with a past medical history significant for atrial fibrillation currently on anticoagulation with Eliquis, diabetes mellitus, aortic aneurysm, osteoarthritis, hyperlipidemia, TIA, coronary artery disease, congestive heart failure, carotid stenosis, asthma/COPD, acute pancreatitis, gallstones, pelvic fracture, and hyponatremia is referred to the clinic for evaluation of intermittent anemia, leukocytopenia, and thrombocytopenia.  Per chart review, it appears that the patient started having periods of intermittent anemia starting in February 2020.  The patient recently had a visit with his primary care provider in June 2021.  He had routine blood work performed which noted a hemoglobin at 13.3, and MCV elevated at 106.9, mild thrombocytopenia with a platelet count of 128K, and a mildly low white blood cell count at 3.87.  He had further lab testing including folate which was within normal limits at 18.3, ferritin, iron studies, and vitamin B12 which were also within normal limits. He had his labs repeated in July 2021 which showed a WBC WNL at 4.6, Hbg of 12.9, and MCV at 104.0. He was referred to the clinic today for further evaluation regarding his current condition.  The patient is feeling "great" today without any concerning complaints.  He denies any history of vitamin deficiencies.  The patient denies any abnormal bleeding including nosebleeding, gingival bleeding, hemoptysis, hematemesis, melena, hematochezia, or hematuria.  He is on a blood thinner and reports some easy extremity bruising. He started his blood thinner "a little over a year ago". The patient has never had a colonoscopy.  The patient denies any history of bariatric surgery.  He denies any recent signs and symptoms of infection.  He  denies any fever, chills, night sweats, unexplained weight loss, or lymphadenopathy.  He denies any abnormal abdominal pain, nausea, vomiting, diarrhea, or constipation.  He denies early satiety.  He denies any recent infections including sore throat, nasal congestion, cough, or dysuria.  He reports that he gets migraines 3-4 times per week for which he takes Advil.  He reports some baseline dyspnea on exertion secondary to his COPD.  He denies any over-the-counter herbal supplements.   The patient's family history consists of a mother who passed away in her 37s due to old age.  The patient's father reportedly had emphysema.  The patient's brother has cataracts.  He denies any family history of any hematological abnormalities.  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  The patient is married.  He has a son and a daughter.  He denies any drug use.  He is a former smoker having quit in the 1970s.  He estimates that he smoked approximately 2 packs/day for 10 years.  The patient does drink 2 alcoholic beverages with scotch per night as his drink of choice.  HPI  Past Medical History:  Diagnosis Date  . Acute on chronic combined systolic (congestive) and diastolic (congestive) heart failure ([place])   . AICD (automatic cardioverter/defibrillator) present    [employer]n PPM/ICD  . Carotid stenosis, right    s/p endarterectomy 11/07/18  . Chronic combined systolic (congestive) and diastolic (congestive) heart failure ([place])   . Coronary artery disease    [facility]  . Diabetes mellitus   . Dysrhythmia   . Endocarditis, valve unspecified    Mitral  . Headache    hx migraines 6-7 x  mo  . History of migraine headaches   . History of seasonal allergies   . ICD (implantable cardiac defibrillator) in place   . PAF (paroxysmal atrial fibrillation) (HCC)    NO LAA occlusion at the time of surgery  [name] 2018  . Pleural effusion   . PVC's (premature  ventricular contractions)   . Ventricular tachycardia, polymorphic (HCC)    status post ICD implantation    Past Surgical History:  Procedure Laterality Date  . CARDIAC CATHETERIZATION  04/03/2007   EF 40%  . CARDIAC CATHETERIZATION  02/06/2006   EF 45%. ANTERIOR HYPOKINESIS  . CARDIAC CATHETERIZATION  05/29/2000   EF 50%. MILD ANTERIOR HYPOKINESIS  . CARDIAC CATHETERIZATION  10/25/1999   EF 50%. SEVERE MITRAL REGURGITATION  . CARDIAC CATHETERIZATION  01/06/2003   EF 50%  . CARDIAC DEFIBRILLATOR PLACEMENT    . CARDIOVERSION N/A 02/07/2019   Procedure: CARDIOVERSION;  Surgeon: Pricilla Riffle, MD;  Location: [facility] ENDOSCOPY;  Service: Cardiovascular;  Laterality: N/A;  . CATARACT EXTRACTION W/ INTRAOCULAR LENS  IMPLANT, BILATERAL    . CHOLECYSTECTOMY N/A 01/08/2019   Procedure: LAPAROSCOPIC CHOLECYSTECTOMY WITH INTRAOPERATIVE CHOLANGIOGRAM;  Surgeon: Manus Rudd, MD;  Location: [facility] OR;  Service: General;  Laterality: N/A;  . CORONARY ARTERY BYPASS GRAFT  2001   2 VESSEL CABG AND MITRAL VALVE REPAIR. HE HAD A LIMA GRAFT TO THE LAD AND RADIAL ARTERY  GRAFT TO THE OBTUSE MARGINAL  OF LEFT CIRCUMFLEX  . ENDARTERECTOMY Right 11/07/2018   Procedure: ENDARTERECTOMY CAROTID RIGHT;  Surgeon: Maeola Harman, MD;  Location: [facility] OR;  Service: Vascular;  Laterality: Right;  . EP IMPLANTABLE DEVICE N/A 11/06/2014   Procedure: ICD Generator Changeout;  Surgeon: Duke Salvia, MD;  Location: [facility] INVASIVE CV LAB;  Service: Cardiovascular;  Laterality: N/A;  . ERCP N/A 01/10/2019   Procedure: ENDOSCOPIC RETROGRADE CHOLANGIOPANCREATOGRAPHY (ERCP);  Surgeon: Vida Rigger, MD;  Location: [facility] ENDOSCOPY;  Service: Endoscopy;  Laterality: N/A;  . FOREIGN BODY REMOVAL Right 06/21/2017   Procedure: FOREIGN BODY REMOVAL ADULT RIGHT RING FINGER;  Surgeon: Betha Loa, MD;  Location: [facility] OR;  Service: Orthopedics;  Laterality: Right;  . HEMORRHOID SURGERY    . HEMORROIDECTOMY    . INTRAVASCULAR PRESSURE WIRE/FFR STUDY  N/A 01/05/2017   Procedure: INTRAVASCULAR PRESSURE WIRE/FFR STUDY;  Surgeon: [name], MD;  Location: [facility] INVASIVE CV LAB;  Service: Cardiovascular;  Laterality: N/A;  . KNEE ARTHROSCOPY     RIGHT KNEE  . LAPAROSCOPIC APPENDECTOMY N/A 01/08/2019   Procedure: APPENDECTOMY LAPAROSCOPIC;  Surgeon: Manus Rudd, MD;  Location: [facility] OR;  Service: General;  Laterality: N/A;  . LEFT HEART CATHETERIZATION WITH CORONARY ANGIOGRAM N/A 07/04/2012   Procedure: LEFT HEART CATHETERIZATION WITH CORONARY ANGIOGRAM;  Surgeon: [name], MD;  Location: [facility] CATH LAB;  Service: Cardiovascular;  Laterality: N/A;  . MITRAL VALVE REPLACEMENT     No LAA occlusion at the time of surgery  Tamela Oddi report 2018  . REMOVAL OF STONES  01/10/2019   Procedure: REMOVAL OF STONES;  Surgeon: Vida Rigger, MD;  Location: [facility] ENDOSCOPY;  Service: Endoscopy;;  . RIGHT/LEFT HEART CATH AND CORONARY/GRAFT ANGIOGRAPHY N/A 01/05/2017   Procedure: RIGHT/LEFT HEART CATH AND CORONARY/GRAFT ANGIOGRAPHY;  Surgeon: [name], MD;  Location: [facility] INVASIVE CV LAB;  Service: Cardiovascular;  Laterality: N/A;  . SPHINCTEROTOMY  01/10/2019   Procedure: SPHINCTEROTOMY;  Surgeon: Vida Rigger, MD;  Location: [facility] ENDOSCOPY;  Service: Endoscopy;;  . TRANSTHORACIC ECHOCARDIOGRAM  04/02/2007   EF 35%    Family  History  Problem Relation Age of Onset  . Heart disease Father   . Emphysema Father   . Stroke Mother   . Cancer Sister        breast cancer  . Heart disease Paternal Grandmother   . Heart disease Paternal Grandfather   . Diabetes Other        grandmother, aunt, uncle  . Cancer Other        lung cancer, aunt    Social History Social History   Tobacco Use  . Smoking status: Former Smoker    Packs/day: 2.00    Years: 16.00    Pack years: 32.00    Types: Cigarettes    Start date: 02/23/1956    Quit date: 01/31/1971    Years since quitting: 48.6  . Smokeless tobacco: Never Used  Vaping Use  . Vaping Use: Never used   Substance Use Topics  . Alcohol use: Yes    Alcohol/week: 0.0 standard drinks    Comment: 1-2 a week  . Drug use: No    Allergies  Allergen Reactions  . Bee Venom Anaphylaxis  . Latex Other (See Comments)    REDNESS AT REACTION SITE.  Marland Kitchen Metformin Diarrhea  . Rivaroxaban Other (See Comments)    Headaches, blurred vision  . Sotalol Other (See Comments)    "BLUE TOES"- cut his circulation off  . Warfarin Sodium Other (See Comments)    REACTION: migraine headaches/vision impariment    Current Outpatient Medications  Medication Sig Dispense Refill  . acetaminophen (TYLENOL) 500 MG tablet Take 2 tablets (1,000 mg total) by mouth every 8 (eight) hours as needed for mild pain, moderate pain or headache. 30 tablet 0  . atorvastatin (LIPITOR) 80 MG tablet Take 1 tablet (80 mg total) by mouth at bedtime. Hold until liver function has normalized.    . Cholecalciferol (VITAMIN D) 125 MCG (5000 UT) CAPS Take 5,000-10,000 Units by mouth See admin instructions. Take 10000 units by mouth on Monday, Wednesday and Friday and take 5000 units on Tuesday, Thursday, Saturday and Sunday    . dofetilide ([name]) 500 MCG capsule Take 1 capsule (500 mcg total) by mouth 2 (two) times daily. 180 capsule 3  . ELIQUIS 5 MG TABS tablet TAKE ONE TABLET BY MOUTH TWICE DAILY 180 tablet 1  . EPIPEN 2-PAK 0.3 MG/0.3ML SOAJ injection Inject 0.3 mg into the muscle daily as needed for anaphylaxis.     Eduard Roux (AIMOVIG) 70 MG/ML SOAJ Inject 70 mg into the skin every 30 (thirty) days. 1 pen 5  . fluticasone (FLONASE) 50 MCG/ACT nasal spray Place 1 spray into the nose daily as needed for rhinitis or allergies.    . furosemide (LASIX) 20 MG tablet Take 20 mg by mouth daily as needed for fluid.    . Magnesium 250 MG TABS Take 250 mg by mouth daily.     . metoprolol succinate (TOPROL-XL) 50 MG 24 hr tablet Take 50 mg by mouth daily. Take with or immediately following a meal.    . nateglinide (STARLIX) 120 MG tablet  Take 120 mg by mouth 2 (two) times daily before a meal.     . pioglitazone (ACTOS) 45 MG tablet Take 45 mg by mouth daily.      . sitaGLIPtin (JANUVIA) 100 MG tablet Take 100 mg by mouth at bedtime.     . Tiotropium Bromide-Olodaterol (STIOLTO RESPIMAT) 2.5-2.5 MCG/ACT AERS Inhale 2 puffs into the lungs daily. 4 g 12   No current facility-administered  medications for this visit.    REVIEW OF SYSTEMS:   Review of Systems  Constitutional: Negative for appetite change, chills, fatigue, fever and unexpected weight change.  HENT: Negative for mouth sores, nosebleeds, sore throat and trouble swallowing.   Eyes: Negative for eye problems and icterus.  Respiratory: Positive for dyspnea on exertion. negative for cough, hemoptysis, and wheezing.   Cardiovascular: Negative for chest pain and leg swelling.  Gastrointestinal: Negative for abdominal pain, constipation, diarrhea, nausea and vomiting.  Genitourinary: Negative for bladder incontinence, difficulty urinating, dysuria, frequency and hematuria.   Musculoskeletal: Positive for knee pain secondary to osteoarthritis. negative for back pain, gait problem, neck pain and neck stiffness.  Skin: Negative for itching and rash.  Neurological: Negative for dizziness, extremity weakness, gait problem, headaches, light-headedness and seizures.  Hematological: Positive for easy bruising secondary to his blood thinner.  Negative for adenopathy. Does not bleed easily.  Psychiatric/Behavioral: Negative for confusion, depression and sleep disturbance. The patient is not nervous/anxious.     PHYSICAL EXAMINATION:  Blood pressure 124/80, pulse 84, temperature 98.3 F (36.8 C), temperature source Oral, resp. rate 18, height '6\' 1"'$  (1.854 m), weight 216 lb 1.6 oz (98 kg), SpO2 100 %.  ECOG PERFORMANCE STATUS: 0  Physical Exam  Constitutional: Oriented to person, place, and time and well-developed, well-nourished, and in no distress.  HENT:  Head: Normocephalic  and atraumatic.  Mouth/Throat: Oropharynx is clear and moist. No oropharyngeal exudate.  Eyes: Conjunctivae are normal. Right eye exhibits no discharge. Left eye exhibits no discharge. No scleral icterus.  Neck: Normal range of motion. Neck supple.  Cardiovascular: Normal rate, regular rhythm, normal heart sounds and intact distal pulses.   Pulmonary/Chest: Effort normal and breath sounds normal. No respiratory distress. No wheezes. No rales.  Abdominal: Soft. Bowel sounds are normal. Exhibits no distension and no mass. There is no tenderness.  Musculoskeletal: Normal range of motion. Exhibits no edema.  Lymphadenopathy:    No cervical adenopathy.  Neurological: Alert and oriented to person, place, and time. Exhibits normal muscle tone. Gait normal. Coordination normal.  Skin: Bruise on the left dorsal aspect of his hand. Skin is warm and dry. No rash noted. Not diaphoretic. No erythema. No pallor.  Psychiatric: Mood, memory and judgment normal.  Vitals reviewed.  LABORATORY DATA: Lab Results  Component Value Date   WBC 3.7 (L) 09/10/2019   HGB 13.4 09/10/2019   HCT 39.4 09/10/2019   MCV 104.8 (H) 09/10/2019   PLT 126 (L) 09/10/2019      Chemistry      Component Value Date/Time   NA 136 02/07/2019 0753   NA 134 01/28/2019 1516   K 4.3 02/07/2019 0753   CL 102 02/07/2019 0753   CO2 24 01/28/2019 1516   BUN 12 02/07/2019 0753   BUN 15 01/28/2019 1516   CREATININE 0.90 02/07/2019 0753   CREATININE 1.08 01/04/2016 1630      Component Value Date/Time   CALCIUM 9.4 01/28/2019 1516   ALKPHOS 284 (H) 01/14/2019 0423   AST 30 01/14/2019 0423   ALT 90 (H) 01/14/2019 0423   BILITOT 1.4 (H) 01/14/2019 0423   BILITOT 1.4 (H) 10/23/2018 1454       RADIOGRAPHIC STUDIES: OCT, Retina - OU - Both Eyes  Result Date: 08/14/2019 Right Eye Quality was good. Scan locations included subfoveal. Central Foveal Thickness: 287. Progression has been stable. Findings include abnormal foveal  contour, retinal drusen . Left Eye Quality was good. Scan locations included subfoveal. Central Foveal Thickness:  278. Progression has been stable. Findings include abnormal foveal contour, retinal drusen . Notes OU with no active CSME  Color Fundus Photography Optos - OU - Both Eyes  Result Date: 08/14/2019 Right Eye Progression has been stable. Disc findings include hemorrhage. Macula : normal observations. Vessels : normal observations. Periphery : RPE abnormality. Left Eye Progression has been stable. Disc findings include normal observations. Macula : normal observations. Vessels : normal observations. Periphery : RPE abnormality. Notes OD, with new splinter hemorrhage along the inferotemporal margin of the optic nerve.  Moderate nonproliferative diabetic retinopathy with otherwise reticulated RPE degeneration in the mid periphery, OU  VAS US CAROTID  Result Date: 08/29/2019 Carotid Arterial Duplex Study Indications:       Right endarterectomy and Right CEA on10/8/20. Other Factors:     11/12/18 5 days post op: right neck hematoma and right IJV                    thrombus. Comparison Study:  This is the first complete post op exam Performing Technologist: Thereasa Parkin RVT  Examination Guidelines: A complete evaluation includes B-mode imaging, spectral Doppler, color Doppler, and power Doppler as needed of all accessible portions of each vessel. Bilateral testing is considered an integral part of a complete examination. Limited examinations for reoccurring indications may be performed as noted.  Right Carotid Findings: +----------+--------+--------+--------+------------------+--------+           PSV cm/sEDV cm/sStenosisPlaque DescriptionComments +----------+--------+--------+--------+------------------+--------+ CCA Prox  95      21                                         +----------+--------+--------+--------+------------------+--------+ CCA Mid   77      20                                          +----------+--------+--------+--------+------------------+--------+ CCA Distal53      13                                         +----------+--------+--------+--------+------------------+--------+ ICA Prox  40      9       1-39%                              +----------+--------+--------+--------+------------------+--------+ ICA Mid   74      22                                         +----------+--------+--------+--------+------------------+--------+ ICA Distal67      23                                         +----------+--------+--------+--------+------------------+--------+ ECA       75      10              calcific                   +----------+--------+--------+--------+------------------+--------+ +----------+--------+-------+----------------+-------------------+  PSV cm/sEDV cmsDescribe        Arm Pressure (mmHG) +----------+--------+-------+----------------+-------------------+ [id number]             Multiphasic, WNL                    +----------+--------+-------+----------------+-------------------+ +---------+--------+--+--------+-+---------+ VertebralPSV cm/s33EDV cm/s9Antegrade +---------+--------+--+--------+-+---------+  Left Carotid Findings: +----------+--------+--------+--------+------------------+--------+           PSV cm/sEDV cm/sStenosisPlaque DescriptionComments +----------+--------+--------+--------+------------------+--------+ CCA Prox  113     17                                         +----------+--------+--------+--------+------------------+--------+ CCA Mid   78      15                                         +----------+--------+--------+--------+------------------+--------+ CCA Distal56      13                                         +----------+--------+--------+--------+------------------+--------+ ICA Prox  34      9       1-39%   calcific                    +----------+--------+--------+--------+------------------+--------+ ICA Mid   63      18                                         +----------+--------+--------+--------+------------------+--------+ ICA Distal69      23                                         +----------+--------+--------+--------+------------------+--------+ ECA       51      5               calcific                   +----------+--------+--------+--------+------------------+--------+ +----------+--------+--------+----------------+-------------------+           PSV cm/sEDV cm/sDescribe        Arm Pressure (mmHG) +----------+--------+--------+----------------+-------------------+ [id number]             Multiphasic, WNL                    +----------+--------+--------+----------------+-------------------+ +---------+--------+--+--------+--+---------+ VertebralPSV cm/s38EDV cm/s12Antegrade +---------+--------+--+--------+--+---------+   Summary: Right Carotid: Velocities in the right ICA are consistent with a 1-39% stenosis.                Patent endarterectomy. IJV thrombus appears resolved. No evidence                of hematoma. Left Carotid: Velocities in the left ICA are consistent with a 1-39% stenosis. Vertebrals:  Bilateral vertebral arteries demonstrate antegrade flow. Subclavians: Normal flow hemodynamics were seen in bilateral subclavian              arteries. *See table(s) above for measurements and observations.  Electronically signed by Servando Snare MD on 08/29/2019 at 12:07:22 PM.  Final     ASSESSMENT: This is a very pleasant 86 year old Caucasian male with intermittent anemia, thrombocytopenia, and or leukopenia.   PLAN: The patient was seen with Dr. Julien Nordmann.  The patient had a repeat CBC performed today.  The patient also had an SPEP and FOBT stool cards performed which are pending.  The patient CBC demonstrates a slightly low white blood cell count at 3.7.  Hemoglobin within normal  limits at 13.4 with an MCV elevated at 104.8, and a platelet count of 126k.   The patient previously had a vitamin B12, folate, iron studies, and ferritin performed by his PCP which were within normal limits.  The patient's lab abnormalities may be secondary to prescription medication side effect and/or alcohol use; however,Dr. Julien Nordmann recommends that the patient have a bone marrow biopsy and aspirate performed just to rule out any bone marrow etiology of his lab abnormalities.   I will arrange for the patient to have a bone marrow biopsy and aspirate performed in the next week or so.  I will arrange for follow-up visit approximately 1 week after his bone marrow biopsy and aspirate.  In the meantime, the patient was instructed to return his stool cards the clinic once complete.  I will call him if there is any abnormalities noted on his SPEP which is still pending at this time.  The patient voices understanding of current disease status and treatment options and is in agreement with the current care plan.  All questions were answered. The patient knows to call the clinic with any problems, questions or concerns. We can certainly see the patient much sooner if necessary.  Thank you so much for allowing me to participate in the care of [name]. I will continue to follow up the patient with you and assist in his care.  The total time spent in the appointment was 60 minutes.  Disclaimer: This note was dictated with voice recognition software. Similar sounding words can inadvertently be transcribed and may not be corrected upon review.   Yusra Ravert L Roshard Rezabek September 10, 2019, 2:12 PM  ADDENDUM: Hematology/Oncology Attending: I had a face-to-face encounter with the patient today.  I recommended his care plan.  This is a very pleasant 86 years old white male who came today for evaluation of macrocytic anemia. Reviewing his records we found that the patient has intermittent anemia  but always have macrocytosis.  He was tested in the past for folic acid as well as vitamin B12 that were unremarkable. Repeat CBC today showed no evidence of anemia but he continues to have macrocytosis.  He was also found to have mild leukocytopenia and thrombocytopenia.  He is currently on Lipitor for hypercholesterolemia and this may be the underlying etiology for his leukocytopenia and thrombocytopenia but other myelodysplastic or myeloproliferative disorder could not be excluded. I recommended for the patient to proceed with a bone marrow biopsy and aspirate to rule out any other underlying etiology for his macrocytosis. We will see the patient back for follow-up visit in around 2 weeks for evaluation and discussion of his biopsy results and any further recommendation regarding his condition. The patient was advised to call immediately if he has any other concerning symptoms in the interval.  Disclaimer: This note was dictated with voice recognition software. Similar sounding words can inadvertently be transcribed and may be missed upon review. Eilleen Kempf, MD 09/10/19

## 2019-09-10 ENCOUNTER — Other Ambulatory Visit: Payer: Self-pay

## 2019-09-10 ENCOUNTER — Encounter: Payer: Self-pay | Admitting: Physician Assistant

## 2019-09-10 ENCOUNTER — Inpatient Hospital Stay: Payer: Medicare Other | Attending: Physician Assistant | Admitting: Physician Assistant

## 2019-09-10 ENCOUNTER — Inpatient Hospital Stay: Payer: Medicare Other

## 2019-09-10 ENCOUNTER — Other Ambulatory Visit: Payer: Self-pay | Admitting: Physician Assistant

## 2019-09-10 VITALS — BP 124/80 | HR 84 | Temp 98.3°F | Resp 18 | Ht 73.0 in | Wt 216.1 lb

## 2019-09-10 DIAGNOSIS — I509 Heart failure, unspecified: Secondary | ICD-10-CM

## 2019-09-10 DIAGNOSIS — I4891 Unspecified atrial fibrillation: Secondary | ICD-10-CM | POA: Diagnosis not present

## 2019-09-10 DIAGNOSIS — I2510 Atherosclerotic heart disease of native coronary artery without angina pectoris: Secondary | ICD-10-CM

## 2019-09-10 DIAGNOSIS — D649 Anemia, unspecified: Secondary | ICD-10-CM

## 2019-09-10 DIAGNOSIS — Z87891 Personal history of nicotine dependence: Secondary | ICD-10-CM

## 2019-09-10 DIAGNOSIS — I719 Aortic aneurysm of unspecified site, without rupture: Secondary | ICD-10-CM

## 2019-09-10 DIAGNOSIS — D696 Thrombocytopenia, unspecified: Secondary | ICD-10-CM

## 2019-09-10 DIAGNOSIS — Z803 Family history of malignant neoplasm of breast: Secondary | ICD-10-CM

## 2019-09-10 DIAGNOSIS — D7589 Other specified diseases of blood and blood-forming organs: Secondary | ICD-10-CM | POA: Diagnosis not present

## 2019-09-10 DIAGNOSIS — E119 Type 2 diabetes mellitus without complications: Secondary | ICD-10-CM

## 2019-09-10 DIAGNOSIS — I6529 Occlusion and stenosis of unspecified carotid artery: Secondary | ICD-10-CM | POA: Diagnosis not present

## 2019-09-10 DIAGNOSIS — Z7901 Long term (current) use of anticoagulants: Secondary | ICD-10-CM

## 2019-09-10 DIAGNOSIS — D72819 Decreased white blood cell count, unspecified: Secondary | ICD-10-CM | POA: Diagnosis not present

## 2019-09-10 DIAGNOSIS — J449 Chronic obstructive pulmonary disease, unspecified: Secondary | ICD-10-CM | POA: Diagnosis not present

## 2019-09-10 DIAGNOSIS — Z801 Family history of malignant neoplasm of trachea, bronchus and lung: Secondary | ICD-10-CM | POA: Diagnosis not present

## 2019-09-10 DIAGNOSIS — E7800 Pure hypercholesterolemia, unspecified: Secondary | ICD-10-CM | POA: Diagnosis not present

## 2019-09-10 DIAGNOSIS — Z7289 Other problems related to lifestyle: Secondary | ICD-10-CM

## 2019-09-10 LAB — CBC WITH DIFFERENTIAL (CANCER CENTER ONLY)
Abs Immature Granulocytes: 0.01 10*3/uL (ref 0.00–0.07)
Basophils Absolute: 0 10*3/uL (ref 0.0–0.1)
Basophils Relative: 1 %
Eosinophils Absolute: 0.2 10*3/uL (ref 0.0–0.5)
Eosinophils Relative: 6 %
HCT: 39.4 % (ref 39.0–52.0)
Hemoglobin: 13.4 g/dL (ref 13.0–17.0)
Immature Granulocytes: 0 %
Lymphocytes Relative: 16 %
Lymphs Abs: 0.6 10*3/uL — ABNORMAL LOW (ref 0.7–4.0)
MCH: 35.6 pg — ABNORMAL HIGH (ref 26.0–34.0)
MCHC: 34 g/dL (ref 30.0–36.0)
MCV: 104.8 fL — ABNORMAL HIGH (ref 80.0–100.0)
Monocytes Absolute: 0.5 10*3/uL (ref 0.1–1.0)
Monocytes Relative: 13 %
Neutro Abs: 2.4 10*3/uL (ref 1.7–7.7)
Neutrophils Relative %: 64 %
Platelet Count: 126 10*3/uL — ABNORMAL LOW (ref 150–400)
RBC: 3.76 MIL/uL — ABNORMAL LOW (ref 4.22–5.81)
RDW: 13.2 % (ref 11.5–15.5)
WBC Count: 3.7 10*3/uL — ABNORMAL LOW (ref 4.0–10.5)
nRBC: 0 % (ref 0.0–0.2)

## 2019-09-10 NOTE — Progress Notes (Signed)
Per Cassie PA gave patient stool card to take home and showed him where to find directions on the outside of envelope on how to collect stool.

## 2019-09-11 LAB — PROTEIN ELECTROPHORESIS, SERUM, WITH REFLEX
A/G Ratio: 1.2 (ref 0.7–1.7)
Albumin ELP: 3.8 g/dL (ref 2.9–4.4)
Alpha-1-Globulin: 0.2 g/dL (ref 0.0–0.4)
Alpha-2-Globulin: 0.6 g/dL (ref 0.4–1.0)
Beta Globulin: 1 g/dL (ref 0.7–1.3)
Gamma Globulin: 1.4 g/dL (ref 0.4–1.8)
Globulin, Total: 3.2 g/dL (ref 2.2–3.9)
Total Protein ELP: 7 g/dL (ref 6.0–8.5)

## 2019-09-15 ENCOUNTER — Encounter: Payer: Medicare Other | Admitting: Internal Medicine

## 2019-09-16 ENCOUNTER — Other Ambulatory Visit: Payer: Self-pay | Admitting: Physician Assistant

## 2019-09-16 ENCOUNTER — Other Ambulatory Visit: Payer: Self-pay | Admitting: *Deleted

## 2019-09-16 DIAGNOSIS — I480 Paroxysmal atrial fibrillation: Secondary | ICD-10-CM | POA: Diagnosis not present

## 2019-09-16 DIAGNOSIS — I10 Essential (primary) hypertension: Secondary | ICD-10-CM | POA: Diagnosis not present

## 2019-09-16 DIAGNOSIS — I255 Ischemic cardiomyopathy: Secondary | ICD-10-CM | POA: Diagnosis not present

## 2019-09-16 DIAGNOSIS — D7589 Other specified diseases of blood and blood-forming organs: Secondary | ICD-10-CM

## 2019-09-16 DIAGNOSIS — I2510 Atherosclerotic heart disease of native coronary artery without angina pectoris: Secondary | ICD-10-CM | POA: Diagnosis not present

## 2019-09-16 DIAGNOSIS — Z95810 Presence of automatic (implantable) cardiac defibrillator: Secondary | ICD-10-CM | POA: Diagnosis not present

## 2019-09-16 DIAGNOSIS — E871 Hypo-osmolality and hyponatremia: Secondary | ICD-10-CM | POA: Diagnosis not present

## 2019-09-23 DIAGNOSIS — R040 Epistaxis: Secondary | ICD-10-CM | POA: Diagnosis not present

## 2019-09-24 ENCOUNTER — Other Ambulatory Visit: Payer: Medicare Other

## 2019-09-24 ENCOUNTER — Ambulatory Visit (INDEPENDENT_AMBULATORY_CARE_PROVIDER_SITE_OTHER): Payer: Medicare Other | Admitting: Internal Medicine

## 2019-09-24 ENCOUNTER — Encounter: Payer: Self-pay | Admitting: Internal Medicine

## 2019-09-24 ENCOUNTER — Other Ambulatory Visit: Payer: Self-pay

## 2019-09-24 VITALS — BP 120/76 | HR 85 | Ht 73.0 in | Wt 220.4 lb

## 2019-09-24 DIAGNOSIS — Z95810 Presence of automatic (implantable) cardiac defibrillator: Secondary | ICD-10-CM | POA: Diagnosis not present

## 2019-09-24 DIAGNOSIS — I4819 Other persistent atrial fibrillation: Secondary | ICD-10-CM

## 2019-09-24 DIAGNOSIS — I5022 Chronic systolic (congestive) heart failure: Secondary | ICD-10-CM

## 2019-09-24 DIAGNOSIS — I6523 Occlusion and stenosis of bilateral carotid arteries: Secondary | ICD-10-CM | POA: Diagnosis not present

## 2019-09-24 DIAGNOSIS — Z79899 Other long term (current) drug therapy: Secondary | ICD-10-CM

## 2019-09-24 DIAGNOSIS — I472 Ventricular tachycardia: Secondary | ICD-10-CM | POA: Diagnosis not present

## 2019-09-24 DIAGNOSIS — I4729 Other ventricular tachycardia: Secondary | ICD-10-CM

## 2019-09-24 LAB — CUP PACEART INCLINIC DEVICE CHECK
Battery Remaining Longevity: 28 mo
Battery Voltage: 2.94 V
Brady Statistic AP VP Percent: 82.24 %
Brady Statistic AP VS Percent: 0.76 %
Brady Statistic AS VP Percent: 13.53 %
Brady Statistic AS VS Percent: 3.47 %
Brady Statistic RA Percent Paced: 80.29 %
Brady Statistic RV Percent Paced: 93.26 %
Date Time Interrogation Session: 20210825112707
HighPow Impedance: 39 Ohm
HighPow Impedance: 55 Ohm
Implantable Lead Implant Date: 20040309
Implantable Lead Implant Date: 20040309
Implantable Lead Location: 753859
Implantable Lead Location: 753860
Implantable Lead Model: 158
Implantable Lead Model: 5076
Implantable Lead Serial Number: 115061
Implantable Pulse Generator Implant Date: 20161007
Lead Channel Impedance Value: 399 Ohm
Lead Channel Impedance Value: 4047 Ohm
Lead Channel Impedance Value: 4047 Ohm
Lead Channel Impedance Value: 4047 Ohm
Lead Channel Impedance Value: 4047 Ohm
Lead Channel Impedance Value: 4047 Ohm
Lead Channel Impedance Value: 4047 Ohm
Lead Channel Impedance Value: 4047 Ohm
Lead Channel Impedance Value: 4047 Ohm
Lead Channel Impedance Value: 4047 Ohm
Lead Channel Impedance Value: 4047 Ohm
Lead Channel Impedance Value: 475 Ohm
Lead Channel Impedance Value: 475 Ohm
Lead Channel Pacing Threshold Amplitude: 0.5 V
Lead Channel Pacing Threshold Amplitude: 0.5 V
Lead Channel Pacing Threshold Pulse Width: 0.4 ms
Lead Channel Pacing Threshold Pulse Width: 0.4 ms
Lead Channel Sensing Intrinsic Amplitude: 1.125 mV
Lead Channel Sensing Intrinsic Amplitude: 7.375 mV
Lead Channel Setting Pacing Amplitude: 2 V
Lead Channel Setting Pacing Amplitude: 2.5 V
Lead Channel Setting Pacing Pulse Width: 0.4 ms
Lead Channel Setting Sensing Sensitivity: 0.3 mV

## 2019-09-24 LAB — MAGNESIUM: Magnesium: 2 mg/dL (ref 1.6–2.3)

## 2019-09-24 LAB — CBC
Hematocrit: 36.3 % — ABNORMAL LOW (ref 37.5–51.0)
Hemoglobin: 12.6 g/dL — ABNORMAL LOW (ref 13.0–17.7)
MCH: 35 pg — ABNORMAL HIGH (ref 26.6–33.0)
MCHC: 34.7 g/dL (ref 31.5–35.7)
MCV: 101 fL — ABNORMAL HIGH (ref 79–97)
Platelets: 132 10*3/uL — ABNORMAL LOW (ref 150–450)
RBC: 3.6 x10E6/uL — ABNORMAL LOW (ref 4.14–5.80)
RDW: 12.4 % (ref 11.6–15.4)
WBC: 4.7 10*3/uL (ref 3.4–10.8)

## 2019-09-24 LAB — BASIC METABOLIC PANEL
BUN/Creatinine Ratio: 11 (ref 10–24)
BUN: 12 mg/dL (ref 8–27)
CO2: 27 mmol/L (ref 20–29)
Calcium: 9.3 mg/dL (ref 8.6–10.2)
Chloride: 99 mmol/L (ref 96–106)
Creatinine, Ser: 1.05 mg/dL (ref 0.76–1.27)
GFR calc Af Amer: 74 mL/min/{1.73_m2} (ref >59)
GFR calc non Af Amer: 64 mL/min/{1.73_m2} (ref >59)
Glucose: 141 mg/dL — ABNORMAL HIGH (ref 65–99)
Potassium: 4.8 mmol/L (ref 3.5–5.2)
Sodium: 135 mmol/L (ref 134–144)

## 2019-09-24 MED ORDER — FUROSEMIDE 20 MG PO TABS: 20.0000 mg | ORAL_TABLET | Freq: Every day | ORAL | 3 refills | Status: DC | PRN

## 2019-09-24 NOTE — Progress Notes (Signed)
Patient Care Team: Sueanne Margarita, DO as PCP - General (Internal Medicine) Sherren Mocha, MD as PCP - Cardiology (Cardiology) Deboraha Sprang, MD as PCP - Electrophysiology (Cardiology)   HPI  [name] is a 86 y.o. male Seen in followup for polymorphic ventricular tachycardia for which he has an ICD as well as persistent atrial fibrillation which was quite symptomatic and prompted a visit to the A. fib clinic with the plan for anticoagulation and TEE cardioversion.  He had failed home shocks x2.  There has been an increased burden of Afib He converted then spontaneously.  He remains on Eliquis at this time    Metoprolol was increased but without effect  AFib assoc with slowish Ventricular response but with dyspnea on exertion and fatigue.  He is quite aware of when he is in A. fib and when he is not.  Antiarrhythmics Date Reason stopped  dofetilide 9/20 ineffective  Amiodarone / Tried years ago following CABG stopped but pt does not recall adverse effects    History of coronary disease with prior CABG mitral valve repair both of which were done at [facility]  PFO closure at that time.    DATE TEST EF   6/14 LHC 35-40% LIMA/ RIMA patent   2/17 Echo   30 %   12/18 LHC   LIMA-LAD and right radial graft-OM- patent proximal cx stenosis with negative FFR  11/19 Echo  35-40% LAE Severe (69/3.0/63) Pulm HTN 50  10/20 Echo  35-40% BAE  Pulm NTH 41 mm       Date Cr K Mg Hgb  9/16  0.99 4.2 2.1   12/17  1.08 4.6 2.1   5/19 1.00 4.5 2.2   10/19 1.04 4.4  13.2 (11/19)  1/21  0.9 4.3 1.9 13.4      He was started on Eliquis as noted above.     Efforts to control his atrial fibrillation prompted the reinitiation of amiodarone.  Therapy was associated with a rash and was discontinued and he was resumed on dofetilide  He continues to struggle with sob, esp when in afib, but better than it was a few weeks ago, consistent with optivol elevated but improving   Device  History: STJ dual chamber ICD implanted 2004 for PMVT, ICM; generator change 2010; gen change 2016 (MDT device) History of appropriate therapy: Yes History of AAD therapy: Yes Thromboembolic risk factors ( age  -2, HTN-1, TIA/CVA-2, Vasc disease -1, CHF-1) for a CHADSVASc Score of >=7   Past Medical History:  Diagnosis Date  . Acute on chronic combined systolic (congestive) and diastolic (congestive) heart failure ([place])   . AICD (automatic cardioverter/defibrillator) present    Medtronicn PPM/ICD  . Carotid stenosis, right    s/p endarterectomy 11/07/18  . Chronic combined systolic (congestive) and diastolic (congestive) heart failure ([place])   . Coronary artery disease    [facility]  . Diabetes mellitus   . Dysrhythmia   . Endocarditis, valve unspecified    Mitral  . Headache    hx migraines 6-7 x mo  . History of migraine headaches   . History of seasonal allergies   . ICD (implantable cardiac defibrillator) in place   . PAF (paroxysmal atrial fibrillation) ([place])    NO LAA occlusion at the time of surgery  [name] 2018  . Pleural effusion   . PVC's (premature ventricular contractions)   . Ventricular tachycardia, polymorphic ([place])    status post ICD implantation  Past Surgical History:  Procedure Laterality Date  . CARDIAC CATHETERIZATION  04/03/2007   EF 40%  . CARDIAC CATHETERIZATION  02/06/2006   EF 45%. ANTERIOR HYPOKINESIS  . CARDIAC CATHETERIZATION  05/29/2000   EF 50%. MILD ANTERIOR HYPOKINESIS  . CARDIAC CATHETERIZATION  10/25/1999   EF 50%. SEVERE MITRAL REGURGITATION  . CARDIAC CATHETERIZATION  01/06/2003   EF 50%  . CARDIAC DEFIBRILLATOR PLACEMENT    . CARDIOVERSION N/A 02/07/2019   Procedure: CARDIOVERSION;  Surgeon: Fay Records, MD;  Location: [place];  Service: Cardiovascular;  Laterality: N/A;  . CATARACT EXTRACTION W/ INTRAOCULAR LENS  IMPLANT, BILATERAL    . CHOLECYSTECTOMY N/A 01/08/2019   Procedure: LAPAROSCOPIC  CHOLECYSTECTOMY WITH INTRAOPERATIVE CHOLANGIOGRAM;  Surgeon: Donnie Mesa, MD;  Location: [place];  Service: General;  Laterality: N/A;  . CORONARY ARTERY BYPASS GRAFT  2001   2 VESSEL CABG AND MITRAL VALVE REPAIR. HE HAD A LIMA GRAFT TO THE LAD AND RADIAL ARTERY  GRAFT TO THE OBTUSE MARGINAL  OF LEFT CIRCUMFLEX  . ENDARTERECTOMY Right 11/07/2018   Procedure: ENDARTERECTOMY CAROTID RIGHT;  Surgeon: Waynetta Sandy, MD;  Location: [place];  Service: Vascular;  Laterality: Right;  . EP IMPLANTABLE DEVICE N/A 11/06/2014   Procedure: ICD Generator Changeout;  Surgeon: Deboraha Sprang, MD;  Location[place] [facility];  Service: Cardiovascular;  Laterality: N/A;  . ERCP N/A 01/10/2019   Procedure: ENDOSCOPIC RETROGRADE CHOLANGIOPANCREATOGRAPHY (ERCP);  Surgeon: Clarene Essex, MD;  Location: [place];  Service: Endoscopy;  Laterality: N/A;  . FOREIGN BODY REMOVAL Right 06/21/2017   Procedure: FOREIGN BODY REMOVAL ADULT RIGHT RING FINGER;  Surgeon: Leanora Cover, MD;  Location: [place];  Service: Orthopedics;  Laterality: Right;  . HEMORRHOID SURGERY    . HEMORROIDECTOMY    . INTRAVASCULAR PRESSURE WIRE/FFR STUDY N/A 01/05/2017   Procedure: INTRAVASCULAR PRESSURE WIRE/FFR STUDY;  Surgeon: Sherren Mocha, MD;  Location: [place] [facility];  Service: Cardiovascular;  Laterality: N/A;  . KNEE ARTHROSCOPY     RIGHT KNEE  . LAPAROSCOPIC APPENDECTOMY N/A 01/08/2019   Procedure: APPENDECTOMY LAPAROSCOPIC;  Surgeon: Donnie Mesa, MD;  Location: [place];  Service: General;  Laterality: N/A;  . LEFT HEART CATHETERIZATION WITH CORONARY ANGIOGRAM N/A 07/04/2012   Procedure: LEFT HEART CATHETERIZATION WITH CORONARY ANGIOGRAM;  Surgeon: Sherren Mocha, MD;  Location: [facility] C[facility];  Service: Cardiovascular;  Laterality: N/A;  . MITRAL VALVE REPLACEMENT     No LAA occlusion at the time of surgery  Karie Soda report 2018  . REMOVAL OF STONES  01/10/2019   Procedure: REMOVAL OF STONES;  Surgeon: Clarene Essex, MD;   Location: [place];  Service: Endoscopy;;  . RIGHT/LEFT HEART CATH AND CORONARY/GRAFT ANGIOGRAPHY N/A 01/05/2017   Procedure: RIGHT/LEFT HEART CATH AND CORONARY/GRAFT ANGIOGRAPHY;  Surgeon: Sherren Mocha, MD;  Location: [place] [facility];  Service: Cardiovascular;  Laterality: N/A;  . SPHINCTEROTOMY  01/10/2019   Procedure: SPHINCTEROTOMY;  Surgeon: Clarene Essex, MD;  Location: [place];  Service: Endoscopy;;  . TRANSTHORACIC ECHOCARDIOGRAM  04/02/2007   EF 35%    Current Outpatient Medications  Medication Sig Dispense Refill  . acetaminophen (TYLENOL) 500 MG tablet Take 2 tablets (1,000 mg total) by mouth every 8 (eight) hours as needed for mild pain, moderate pain or headache. 30 tablet 0  . atorvastatin (LIPITOR) 80 MG tablet Take 1 tablet (80 mg total) by mouth at bedtime. Hold until liver function has normalized.    . cetirizine (ZYRTEC) 10 MG tablet Take 1 tablet by  mouth daily as needed.    . Cholecalciferol (VITAMIN D) 125 MCG (5000 UT) CAPS Take 5,000-10,000 Units by mouth See admin instructions. Take 10000 units by mouth on Monday, Wednesday and Friday and take 5000 units on Tuesday, Thursday, Saturday and Sunday    . dofetilide ([name]) 500 MCG capsule Take 1 capsule (500 mcg total) by mouth 2 (two) times daily. 180 capsule 3  . ELIQUIS 5 MG TABS tablet TAKE ONE TABLET BY MOUTH TWICE DAILY 180 tablet 1  . EPIPEN 2-PAK 0.3 MG/0.3ML SOAJ injection Inject 0.3 mg into the muscle daily as needed for anaphylaxis.     Eduard Roux (AIMOVIG) 70 MG/ML SOAJ Inject 70 mg into the skin every 30 (thirty) days. 1 pen 5  . fluticasone (FLONASE) 50 MCG/ACT nasal spray Place 1 spray into the nose daily as needed for rhinitis or allergies.    . furosemide (LASIX) 20 MG tablet Take 20 mg by mouth daily as needed for fluid.    . Magnesium 250 MG TABS Take 250 mg by mouth daily.     . metoprolol succinate (TOPROL-XL) 50 MG 24 hr tablet Take 50 mg by mouth daily. Take with or immediately  following a meal.    . nateglinide (STARLIX) 120 MG tablet Take 120 mg by mouth 2 (two) times daily before a meal.     . pioglitazone (ACTOS) 45 MG tablet Take 45 mg by mouth daily.      . sitaGLIPtin (JANUVIA) 100 MG tablet Take 100 mg by mouth at bedtime.     . Tiotropium Bromide-Olodaterol (STIOLTO RESPIMAT) 2.5-2.5 MCG/ACT AERS Inhale 2 puffs into the lungs daily. 4 g 12   No current facility-administered medications for this visit.    Allergies  Allergen Reactions  . Bee Venom Anaphylaxis  . Latex Other (See Comments)    REDNESS AT REACTION SITE.  Marland Kitchen Metformin Diarrhea  . Rivaroxaban Other (See Comments)    Headaches, blurred vision  . Sotalol Other (See Comments)    "BLUE TOES"- cut his circulation off  . Warfarin Sodium Other (See Comments)    REACTION: migraine headaches/vision impariment    Review of Systems negative except from HPI and PMH  Physical Exam BP 120/76   Pulse 85   Ht 6\' 1"  (1.854 m)   Wt 220 lb 6.4 oz (100 kg)   SpO2 100%   BMI 29.08 kg/m   Well developed and well nourished in no acute distress HENT normal Neck supple with JVP-flat Clear Device pocket well healed; without hematoma or erythema.  There is no tethering  Regular rate and rhythm, no gallop /2/6 early murmur Abd-soft with active BS No Clubbing cyanosis  tr edema Skin-warm and dry A & Oriented  Grossly normal sensory and motor function  ECG V\pacing Prob afib -/19/5    Assessment and  Plan  Ischemic heart disease with prior bypass ejection 35-40%  Congestive heart failure-chronic-systolic  Implantable defibrillator-Medtronic dual chamber with LV port plugged.   PVCs//Ventricular tachycardia nonsustained     Atrial fibrillation persistent/recurrent  Dofetilide therapy  COPD-reactive airways disease    Euvolemic.  Continue current medications.  No symptoms of ischemia.  Atrial fibrillation burden much decreased since the up titration of his dofetilide.  Surveillance  laboratories were normal and will be rechecked today  Device function normal.  Reprogrammed to increase his lower rate limit from 60--70 to decrease atrial ectopy.

## 2019-09-24 NOTE — Patient Instructions (Addendum)
Medication Instructions:  Your physician recommends that you continue on your current medications as directed. Please refer to the Current Medication list given to you today.  *If you need a refill on your cardiac medications before your next appointment, please call your pharmacy*   Lab Work: BMET, MAG and CBC today If you have labs (blood work) drawn today and your tests are completely normal, you will receive your results only by: Marland Kitchen MyChart Message (if you have MyChart) OR . A paper copy in the mail If you have any lab test that is abnormal or we need to change your treatment, we will call you to review the results.   Testing/Procedures: None ordered.    Follow-Up: At [facility], you and your health needs are our priority.  As part of our continuing mission to provide you with exceptional heart care, we have created designated Provider Care Teams.  These Care Teams include your primary Cardiologist (physician) and Advanced Practice Providers (APPs -  Physician Assistants and Nurse Practitioners) who all work together to provide you with the care you need, when you need it.  We recommend signing up for the patient portal called "MyChart".  Sign up information is provided on this After Visit Summary.  MyChart is used to connect with patients for Virtual Visits (Telemedicine).  Patients are able to view lab/test results, encounter notes, upcoming appointments, etc.  Non-urgent messages can be sent to your provider as well.   To learn more about what you can do with MyChart, go to [email or web address].    Your next appointment:   6 months  The format for your next appointment:   In Person  Provider:   [name], MD

## 2019-09-25 ENCOUNTER — Other Ambulatory Visit: Payer: Self-pay | Admitting: Physician Assistant

## 2019-09-25 ENCOUNTER — Other Ambulatory Visit: Payer: Medicare Other

## 2019-09-25 DIAGNOSIS — D7589 Other specified diseases of blood and blood-forming organs: Secondary | ICD-10-CM

## 2019-09-26 ENCOUNTER — Ambulatory Visit: Payer: Medicare Other | Admitting: Vascular Surgery

## 2019-10-02 ENCOUNTER — Other Ambulatory Visit: Payer: Self-pay | Admitting: *Deleted

## 2019-10-02 ENCOUNTER — Other Ambulatory Visit: Payer: Self-pay | Admitting: Radiology

## 2019-10-02 DIAGNOSIS — D649 Anemia, unspecified: Secondary | ICD-10-CM

## 2019-10-02 LAB — OCCULT BLOOD X 1 CARD TO LAB, STOOL
Fecal Occult Bld: NEGATIVE
Fecal Occult Bld: NEGATIVE
Fecal Occult Bld: NEGATIVE

## 2019-10-03 ENCOUNTER — Encounter (HOSPITAL_COMMUNITY): Payer: Self-pay

## 2019-10-03 ENCOUNTER — Other Ambulatory Visit: Payer: Self-pay

## 2019-10-03 ENCOUNTER — Ambulatory Visit (HOSPITAL_COMMUNITY)
Admission: RE | Admit: 2019-10-03 | Discharge: 2019-10-03 | Disposition: A | Discharge status: Home / Self Care | Payer: Medicare Other | Source: Ambulatory Visit | Attending: Internal Medicine | Admitting: Internal Medicine

## 2019-10-03 ENCOUNTER — Ambulatory Visit (HOSPITAL_COMMUNITY)
Admission: RE | Admit: 2019-10-03 | Discharge: 2019-10-03 | Disposition: A | Discharge status: Home / Self Care | Payer: Medicare Other | Source: Ambulatory Visit | Attending: Physician Assistant | Admitting: Physician Assistant

## 2019-10-03 DIAGNOSIS — Z888 Allergy status to other drugs, medicaments and biological substances status: Secondary | ICD-10-CM | POA: Diagnosis not present

## 2019-10-03 DIAGNOSIS — D696 Thrombocytopenia, unspecified: Secondary | ICD-10-CM | POA: Diagnosis not present

## 2019-10-03 DIAGNOSIS — I480 Paroxysmal atrial fibrillation: Secondary | ICD-10-CM | POA: Diagnosis not present

## 2019-10-03 DIAGNOSIS — D7589 Other specified diseases of blood and blood-forming organs: Secondary | ICD-10-CM

## 2019-10-03 DIAGNOSIS — I6521 Occlusion and stenosis of right carotid artery: Secondary | ICD-10-CM | POA: Diagnosis not present

## 2019-10-03 DIAGNOSIS — Z95810 Presence of automatic (implantable) cardiac defibrillator: Secondary | ICD-10-CM | POA: Insufficient documentation

## 2019-10-03 DIAGNOSIS — G43909 Migraine, unspecified, not intractable, without status migrainosus: Secondary | ICD-10-CM | POA: Insufficient documentation

## 2019-10-03 DIAGNOSIS — Z79899 Other long term (current) drug therapy: Secondary | ICD-10-CM | POA: Diagnosis not present

## 2019-10-03 DIAGNOSIS — Z951 Presence of aortocoronary bypass graft: Secondary | ICD-10-CM | POA: Diagnosis not present

## 2019-10-03 DIAGNOSIS — I2510 Atherosclerotic heart disease of native coronary artery without angina pectoris: Secondary | ICD-10-CM | POA: Insufficient documentation

## 2019-10-03 DIAGNOSIS — Z7901 Long term (current) use of anticoagulants: Secondary | ICD-10-CM | POA: Diagnosis not present

## 2019-10-03 DIAGNOSIS — E119 Type 2 diabetes mellitus without complications: Secondary | ICD-10-CM | POA: Diagnosis not present

## 2019-10-03 DIAGNOSIS — I5042 Chronic combined systolic (congestive) and diastolic (congestive) heart failure: Secondary | ICD-10-CM | POA: Insufficient documentation

## 2019-10-03 DIAGNOSIS — D72819 Decreased white blood cell count, unspecified: Secondary | ICD-10-CM | POA: Diagnosis not present

## 2019-10-03 DIAGNOSIS — D61818 Other pancytopenia: Secondary | ICD-10-CM | POA: Diagnosis not present

## 2019-10-03 DIAGNOSIS — D649 Anemia, unspecified: Secondary | ICD-10-CM | POA: Diagnosis not present

## 2019-10-03 LAB — GLUCOSE, CAPILLARY: Glucose-Capillary: 112 mg/dL — ABNORMAL HIGH (ref 70–99)

## 2019-10-03 LAB — CBC WITH DIFFERENTIAL/PLATELET
Abs Immature Granulocytes: 0.01 10*3/uL (ref 0.00–0.07)
Basophils Absolute: 0 10*3/uL (ref 0.0–0.1)
Basophils Relative: 1 %
Eosinophils Absolute: 0.2 10*3/uL (ref 0.0–0.5)
Eosinophils Relative: 5 %
HCT: 41.1 % (ref 39.0–52.0)
Hemoglobin: 13.9 g/dL (ref 13.0–17.0)
Immature Granulocytes: 0 %
Lymphocytes Relative: 15 %
Lymphs Abs: 0.7 10*3/uL (ref 0.7–4.0)
MCH: 35.4 pg — ABNORMAL HIGH (ref 26.0–34.0)
MCHC: 33.8 g/dL (ref 30.0–36.0)
MCV: 104.6 fL — ABNORMAL HIGH (ref 80.0–100.0)
Monocytes Absolute: 0.5 10*3/uL (ref 0.1–1.0)
Monocytes Relative: 11 %
Neutro Abs: 3.1 10*3/uL (ref 1.7–7.7)
Neutrophils Relative %: 68 %
Platelets: 132 10*3/uL — ABNORMAL LOW (ref 150–400)
RBC: 3.93 MIL/uL — ABNORMAL LOW (ref 4.22–5.81)
RDW: 13.2 % (ref 11.5–15.5)
WBC: 4.6 10*3/uL (ref 4.0–10.5)
nRBC: 0 % (ref 0.0–0.2)

## 2019-10-03 LAB — PROTIME-INR
INR: 1.3 — ABNORMAL HIGH (ref 0.8–1.2)
Prothrombin Time: 15.8 seconds — ABNORMAL HIGH (ref 11.4–15.2)

## 2019-10-03 MED ORDER — FLUMAZENIL 0.5 MG/5ML IV SOLN
INTRAVENOUS | Status: AC
  Filled 2019-10-03: qty 5

## 2019-10-03 MED ORDER — MIDAZOLAM HCL 2 MG/2ML IJ SOLN
INTRAMUSCULAR | Status: AC | PRN
  Administered 2019-10-03 (×2): 1 mg via INTRAVENOUS

## 2019-10-03 MED ORDER — MIDAZOLAM HCL 2 MG/2ML IJ SOLN
INTRAMUSCULAR | Status: AC
  Filled 2019-10-03: qty 2

## 2019-10-03 MED ORDER — LIDOCAINE HCL (PF) 1 % IJ SOLN
INTRAMUSCULAR | Status: AC | PRN
  Administered 2019-10-03: 10 mL via INTRADERMAL

## 2019-10-03 MED ORDER — HYDROCODONE-ACETAMINOPHEN 5-325 MG PO TABS: 1.0000 | ORAL_TABLET | ORAL | Status: DC | PRN

## 2019-10-03 MED ORDER — FENTANYL CITRATE (PF) 100 MCG/2ML IJ SOLN
INTRAMUSCULAR | Status: AC | PRN
  Administered 2019-10-03 (×2): 50 ug via INTRAVENOUS

## 2019-10-03 MED ORDER — FENTANYL CITRATE (PF) 100 MCG/2ML IJ SOLN
INTRAMUSCULAR | Status: AC
  Filled 2019-10-03: qty 2

## 2019-10-03 MED ORDER — NALOXONE HCL 0.4 MG/ML IJ SOLN
INTRAMUSCULAR | Status: AC
  Filled 2019-10-03: qty 1

## 2019-10-03 MED ORDER — SODIUM CHLORIDE 0.9 % IV SOLN: INTRAVENOUS | Status: DC

## 2019-10-03 NOTE — Procedures (Signed)
Interventional Radiology Procedure Note  Procedure: CT BM ASP AND CORE    Complications: None  Estimated Blood Loss:  MIN  Findings: 11 G CORE AND ASP    M. TREVOR Chrstopher Malenfant, MD    

## 2019-10-03 NOTE — Discharge Instructions (Addendum)
Please call Interventional Radiology clinic [phone] with any questions or concerns.  You may remove your dressing and shower tomorrow.   Bone Marrow Aspiration and Bone Marrow Biopsy, Adult, Care After This sheet gives you information about how to care for yourself after your procedure. Your health care provider may also give you more specific instructions. If you have problems or questions, contact your health care provider. What can I expect after the procedure? After the procedure, it is common to have:  Mild pain and tenderness.  Swelling.  Bruising. Follow these instructions at home: Puncture site care   Follow instructions from your health care provider about how to take care of the puncture site. Make sure you: ? Wash your hands with soap and water before and after you change your bandage (dressing). If soap and water are not available, use hand sanitizer. ? Change your dressing as told by your health care provider.  Check your puncture site every day for signs of infection. Check for: ? More redness, swelling, or pain. ? Fluid or blood. ? Warmth. ? Pus or a bad smell. Activity  Return to your normal activities as told by your health care provider. Ask your health care provider what activities are safe for you.  Do not lift anything that is heavier than 10 lb (4.5 kg), or the limit that you are told, until your health care provider says that it is safe.  Do not drive for 24 hours if you were given a sedative during your procedure. General instructions   Take over-the-counter and prescription medicines only as told by your health care provider.  Do not take baths, swim, or use a hot tub until your health care provider approves. Ask your health care provider if you may take showers. You may only be allowed to take sponge baths.  If directed, put ice on the affected area. To do this: ? Put ice in a plastic bag. ? Place a towel between your skin and the  bag. ? Leave the ice on for 20 minutes, 2-3 times a day.  Keep all follow-up visits as told by your health care provider. This is important. Contact a health care provider if:  Your pain is not controlled with medicine.  You have a fever.  You have more redness, swelling, or pain around the puncture site.  You have fluid or blood coming from the puncture site.  Your puncture site feels warm to the touch.  You have pus or a bad smell coming from the puncture site. Summary  After the procedure, it is common to have mild pain, tenderness, swelling, and bruising.  Follow instructions from your health care provider about how to take care of the puncture site and what activities are safe for you.  Take over-the-counter and prescription medicines only as told by your health care provider.  Contact a health care provider if you have any signs of infection, such as fluid or blood coming from the puncture site. This information is not intended to replace advice given to you by your health care provider. Make sure you discuss any questions you have with your health care provider. Document Revised: 06/04/2018 Document Reviewed: 06/04/2018 Elsevier Patient Education  2020 [employer]   Moderate Conscious Sedation, Adult, Care After These instructions provide you with information about caring for yourself after your procedure. Your health care provider may also give you more specific instructions. Your treatment has been planned according to current medical practices, but problems sometimes occur. Call   your health care provider if you have any problems or questions after your procedure. What can I expect after the procedure? After your procedure, it is common:  To feel sleepy for several hours.  To feel clumsy and have poor balance for several hours.  To have poor judgment for several hours.  To vomit if you eat too soon. Follow these instructions at home: For at least 24 hours after  the procedure:   Do not: ? Participate in activities where you could fall or become injured. ? Drive. ? Use heavy machinery. ? Drink alcohol. ? Take sleeping pills or medicines that cause drowsiness. ? Make important decisions or sign legal documents. ? Take care of children on your own.  Rest. Eating and drinking  Follow the diet recommended by your health care provider.  If you vomit: ? Drink water, juice, or soup when you can drink without vomiting. ? Make sure you have little or no nausea before eating solid foods. General instructions  Have a responsible adult stay with you until you are awake and alert.  Take over-the-counter and prescription medicines only as told by your health care provider.  If you smoke, do not smoke without supervision.  Keep all follow-up visits as told by your health care provider. This is important. Contact a health care provider if:  You keep feeling nauseous or you keep vomiting.  You feel light-headed.  You develop a rash.  You have a fever. Get help right away if:  You have trouble breathing. This information is not intended to replace advice given to you by your health care provider. Make sure you discuss any questions you have with your health care provider. Document Revised: 12/29/2016 Document Reviewed: 05/08/2015 Elsevier Patient Education  2020 [employer] Inc.  

## 2019-10-03 NOTE — H&P (Signed)
Referring Physician(s): Heilingoetter,Cassandra L/Mohamed,M  Supervising Physician: Daryll Brod  Patient Status:  [facility]  Chief Complaint:  "I'm here for a bone marrow biopsy"  Subjective: Patient familiar to IR service from prior left thoracenteses in 2002.  He has a history of intermittent leukocytopenia, thrombocytopenia and anemia and presents today for CT-guided bone marrow biopsy for further evaluation.  He currently denies fever, headache, chest pain, dyspnea, cough, abdominal/back pain, nausea, vomiting or bleeding.  Additional medical history as below.  Past Medical History:  Diagnosis Date  . Acute on chronic combined systolic (congestive) and diastolic (congestive) heart failure ([place])   . AICD (automatic cardioverter/defibrillator) present    [employer]n PPM/ICD  . Carotid stenosis, right    s/p endarterectomy 11/07/18  . Chronic combined systolic (congestive) and diastolic (congestive) heart failure ([place])   . Coronary artery disease    [facility]  [facility]  . Diabetes mellitus   . Dysrhythmia   . Endocarditis, valve unspecified    Mitral  . Headache    hx migraines 6-7 x mo  . History of migraine headaches   . History of seasonal allergies   . ICD (implantable cardiac defibrillator) in place   . PAF (paroxysmal atrial fibrillation) ([place])    NO LAA occlusion at the time of surgery  [name] 2018  . Pleural effusion   . PVC's (premature ventricular contractions)   . Ventricular tachycardia, polymorphic ([place])    status post ICD implantation   Past Surgical History:  Procedure Laterality Date  . CARDIAC CATHETERIZATION  04/03/2007   EF 40%  . CARDIAC CATHETERIZATION  02/06/2006   EF 45%. ANTERIOR HYPOKINESIS  . CARDIAC CATHETERIZATION  05/29/2000   EF 50%. MILD ANTERIOR HYPOKINESIS  . CARDIAC CATHETERIZATION  10/25/1999   EF 50%. SEVERE MITRAL REGURGITATION  . CARDIAC CATHETERIZATION  01/06/2003   EF 50%  . CARDIAC DEFIBRILLATOR PLACEMENT    .  CARDIOVERSION N/A 02/07/2019   Procedure: CARDIOVERSION;  Surgeon: Fay Records, MD;  Location: [place];  Service: Cardiovascular;  Laterality: N/A;  . CATARACT EXTRACTION W/ INTRAOCULAR LENS  IMPLANT, BILATERAL    . CHOLECYSTECTOMY N/A 01/08/2019   Procedure: LAPAROSCOPIC CHOLECYSTECTOMY WITH INTRAOPERATIVE CHOLANGIOGRAM;  Surgeon: Donnie Mesa, MD;  Location: [place];  Service: General;  Laterality: N/A;  . CORONARY ARTERY BYPASS GRAFT  2001   2 VESSEL CABG AND MITRAL VALVE REPAIR. HE HAD A LIMA GRAFT TO THE LAD AND RADIAL ARTERY  GRAFT TO THE OBTUSE MARGINAL  OF LEFT CIRCUMFLEX  . ENDARTERECTOMY Right 11/07/2018   Procedure: ENDARTERECTOMY CAROTID RIGHT;  Surgeon: Waynetta Sandy, MD;  Location: [place];  Service: Vascular;  Laterality: Right;  . EP IMPLANTABLE DEVICE N/A 11/06/2014   Procedure: ICD Generator Changeout;  Surgeon: Deboraha Sprang, MD;  Location: [place] [facility];  Service: Cardiovascular;  Laterality: N/A;  . ERCP N/A 01/10/2019   Procedure: ENDOSCOPIC RETROGRADE CHOLANGIOPANCREATOGRAPHY (ERCP);  Surgeon: Clarene Essex, MD;  Location: [place];  Service: Endoscopy;  Laterality: N/A;  . FOREIGN BODY REMOVAL Right 06/21/2017   Procedure: FOREIGN BODY REMOVAL ADULT RIGHT RING FINGER;  Surgeon: Leanora Cover, MD;  Location: [place];  Service: Orthopedics;  Laterality: Right;  . HEMORRHOID SURGERY    . HEMORROIDECTOMY    . INTRAVASCULAR PRESSURE WIRE/FFR STUDY N/A 01/05/2017   Procedure: INTRAVASCULAR PRESSURE WIRE/FFR STUDY;  Surgeon: Sherren Mocha, MD;  Location: [place] [facility];  Service: Cardiovascular;  Laterality: N/A;  . KNEE ARTHROSCOPY  RIGHT KNEE  . LAPAROSCOPIC APPENDECTOMY N/A 01/08/2019   Procedure: APPENDECTOMY LAPAROSCOPIC;  Surgeon: Donnie Mesa, MD;  Location: [place];  Service: General;  Laterality: N/A;  . LEFT HEART CATHETERIZATION WITH CORONARY ANGIOGRAM N/A 07/04/2012   Procedure: LEFT HEART CATHETERIZATION WITH CORONARY ANGIOGRAM;  Surgeon:  Sherren Mocha, MD;  Location[facility] C[facility] LAB;  Service: Cardiovascular;  Laterality: N/A;  . MITRAL VALVE REPLACEMENT     No LAA occlusion at the time of surgery  Karie Soda report 2018  . REMOVAL OF STONES  01/10/2019   Procedure: REMOVAL OF STONES;  Surgeon: Clarene Essex, MD;  Location: [place];  Service: Endoscopy;;  . RIGHT/LEFT HEART CATH AND CORONARY/GRAFT ANGIOGRAPHY N/A 01/05/2017   Procedure: RIGHT/LEFT HEART CATH AND CORONARY/GRAFT ANGIOGRAPHY;  Surgeon: Sherren Mocha, MD;  Location: [place] [facility];  Service: Cardiovascular;  Laterality: N/A;  . SPHINCTEROTOMY  01/10/2019   Procedure: SPHINCTEROTOMY;  Surgeon: Clarene Essex, MD;  Location: [facility] ENDOSCOPY;  Service: Endoscopy;;  . TRANSTHORACIC ECHOCARDIOGRAM  04/02/2007   EF 35%      Allergies: Bee venom, Latex, Metformin, Rivaroxaban, Sotalol, and Warfarin sodium  Medications: Prior to Admission medications   Medication Sig Start Date End Date Taking? Authorizing Provider  acetaminophen (TYLENOL) 500 MG tablet Take 2 tablets (1,000 mg total) by mouth every 8 (eight) hours as needed for mild pain, moderate pain or headache. 01/13/19   Earnstine Regal, PA-C  atorvastatin (LIPITOR) 80 MG tablet Take 1 tablet (80 mg total) by mouth at bedtime. Hold until liver function has normalized. 01/14/19   Mikhail, Velta Addison, DO  cetirizine (ZYRTEC) 10 MG tablet Take 1 tablet by mouth daily as needed.    [provider]  Cholecalciferol (VITAMIN D) 125 MCG (5000 UT) CAPS Take 5,000-10,000 Units by mouth See admin instructions. Take 10000 units by mouth on Monday, Wednesday and Friday and take 5000 units on Tuesday, Thursday, Saturday and Sunday    [provider]  dofetilide ([name]) 500 MCG capsule Take 1 capsule (500 mcg total) by mouth 2 (two) times daily. 01/28/19   Deboraha Sprang, MD  ELIQUIS 5 MG TABS tablet TAKE ONE TABLET BY MOUTH TWICE DAILY 06/11/19   Deboraha Sprang, MD  EPIPEN 2-PAK 0.3 MG/0.3ML SOAJ injection  Inject 0.3 mg into the muscle daily as needed for anaphylaxis.  08/12/12   [provider]  Erenumab-aooe (AIMOVIG) 70 MG/ML SOAJ Inject 70 mg into the skin every 30 (thirty) days. 06/06/19   Tomi Likens, Adam R, DO  fluticasone (FLONASE) 50 MCG/ACT nasal spray Place 1 spray into the nose daily as needed for rhinitis or allergies.    [provider]  furosemide (LASIX) 20 MG tablet Take 1 tablet (20 mg total) by mouth daily as needed for fluid. 09/24/19   Deboraha Sprang, MD  Magnesium 250 MG TABS Take 250 mg by mouth daily.     [provider]  metoprolol succinate (TOPROL-XL) 50 MG 24 hr tablet Take 50 mg by mouth daily. Take with or immediately following a meal.    [provider]  nateglinide (STARLIX) 120 MG tablet Take 120 mg by mouth 2 (two) times daily before a meal.  07/12/10   [provider]  pioglitazone (ACTOS) 45 MG tablet Take 45 mg by mouth daily.      [provider]  sitaGLIPtin (JANUVIA) 100 MG tablet Take 100 mg by mouth at bedtime.     [provider]  Tiotropium Bromide-Olodaterol (STIOLTO RESPIMAT) 2.5-2.5 MCG/ACT AERS Inhale  2 puffs into the lungs daily. 04/23/19   Lauraine Rinne, NP     Vital Signs: BP 129/90   Pulse 80   Temp 97.8 F (36.6 C) (Oral)   Resp 18   SpO2 100%   Physical Exam awake, alert.  Chest clear to auscultation bilaterally.  Left chest wall AICD.  Heart with normal rate, occasional ectopy noted.  Positive murmur.  Abdomen soft, positive bowel sounds, nontender.  No lower extremity edema.  Imaging: No results found.  Labs:  CBC: Recent Labs    12/02/18 1535 01/14/19 0423 01/28/19 1516 02/07/19 0753 09/10/19 1247 09/24/19 1139  WBC  --  7.8 5.0  --  3.7* 4.7  HGB   < > 10.8* 12.3* 13.6 13.4 12.6*  HCT   < > 32.1* 36.0* 40.0 39.4 36.3*  PLT  --  204 233  --  126* 132*   < > = values in this interval not displayed.    COAGS: Recent Labs    11/04/18 1635 11/06/18 0906  11/08/18 0130 11/11/18 1240 01/04/19 0500 01/04/19 1814 01/08/19 0404 01/09/19 0500 01/10/19 0451 01/11/19 0136  INR 1.4*  --   --  1.4* 1.3*  --   --   --   --   --   APTT 35   < >   < >  --  38*   < > 79* 36 31 112*   < > = values in this interval not displayed.    BMP: Recent Labs    01/13/19 0304 01/13/19 0304 01/14/19 0423 01/28/19 1516 02/07/19 0753 09/24/19 1139  NA 131*   < > 131* 134 136 135  K 3.6   < > 4.2 4.6 4.3 4.8  CL 95*   < > 95* 97 102 99  CO2 27  --  26 24  --  27  GLUCOSE 222*   < > 195* 138* 178* 141*  BUN 8   < > 7* $Re'15 12 12  'Pvr$ CALCIUM 8.2*  --  8.2* 9.4  --  9.3  CREATININE 0.89   < > 0.86 1.00 0.90 1.05  GFRNONAA >60  --  >60 68  --  64  GFRAA >60  --  >60 79  --  74   < > = values in this interval not displayed.    LIVER FUNCTION TESTS: Recent Labs    01/11/19 0500 01/12/19 0631 01/13/19 0304 01/14/19 0423  BILITOT 2.9* 2.4* 1.6* 1.4*  AST 107* 55* 35 30  ALT 214* 158* 116* 90*  ALKPHOS 392* 389* 323* 284*  PROT 5.3* 5.9* 5.6* 5.6*  ALBUMIN 2.3* 2.6* 2.4* 2.6*    Assessment and Plan: 86 yo male with history of intermittent leukocytopenia, thrombocytopenia and anemia ; presents today for CT-guided bone marrow biopsy for further evaluation.  Extensive past medical history also includes CHF, AICD placement, prior right carotid endarterectomy, coronary artery disease with prior CABG, diabetes, paroxysmal atrial fibrillation. Details/risks and benefits of procedure was discussed with the patient  including, but not limited to bleeding, infection, damage to adjacent structures or low yield requiring additional tests.  All of the questions were answered and there is agreement to proceed.  Consent signed and in chart.     Electronically Signed: D. Rowe Robert, PA-C 10/03/2019, 9:28 AM   I spent a total of 20 minutes at the the patient's bedside AND on the patient's hospital floor or unit, greater than 50% of which was  counseling/coordinating care for CT-guided  bone marrow biopsy

## 2019-10-09 LAB — SURGICAL PATHOLOGY

## 2019-10-09 NOTE — Progress Notes (Signed)
[facility] OFFICE PROGRESS NOTE  Sueanne Margarita, DO [address] [place] [address]  DIAGNOSIS: Bicytopenia likely reactive in nature.   PRIOR THERAPY: None  CURRENT THERAPY: None  INTERVAL HISTORY: [name] 86 y.o. male returns to the clinic today for a follow up visit. The patient is feeling well today without any concerning complaints. The patient was referred to the clinic for evaluation of intermittent anemia and mild thrombocytopenia. The patient denies any concerning complaints.  He denies any history of vitamin deficiencies.  The patient denies any abnormal bleeding including nosebleeding, gingival bleeding, hemoptysis, hematemesis, melena, hematochezia, or hematuria.  He is on a blood thinner and reports some easy extremity bruising. He started his blood thinner "a little over a year ago". The patient has never had a colonoscopy. He had FOBT stool cards performed which were negative for blood. The patient denies any history of bariatric surgery.  He denies any recent signs and symptoms of infection.  He denies any fever, chills, night sweats, unexplained weight loss, or lymphadenopathy.  He denies any abnormal abdominal pain, nausea, vomiting, diarrhea, or constipation.  He denies early satiety.  He denies any recent infections including sore throat, nasal congestion, cough, or dysuria. He reports some baseline dyspnea on exertion secondary to his COPD.  He denies any over-the-counter herbal supplements. He drinks about 1-2 alcoholic beverages of scotch per day. He had a bone marrow biopsy and aspirate performed. He is here for evaluation and to review the results of his pathology.   MEDICAL HISTORY: Past Medical History:  Diagnosis Date   Acute on chronic combined systolic (congestive) and diastolic (congestive) heart failure (HCC)    AICD (automatic cardioverter/defibrillator) present    [employer]n PPM/ICD   Carotid stenosis, right    s/p endarterectomy  11/07/18   Chronic combined systolic (congestive) and diastolic (congestive) heart failure (HCC)    Coronary artery disease    [facility]   Diabetes mellitus    Dysrhythmia    Endocarditis, valve unspecified    Mitral   Headache    hx migraines 6-7 x mo   History of migraine headaches    History of seasonal allergies    ICD (implantable cardiac defibrillator) in place    PAF (paroxysmal atrial fibrillation) ([facility])    NO LAA occlusion at the time of surgery  [name] 2018   Pleural effusion    PVC's (premature ventricular contractions)    Ventricular tachycardia, polymorphic (HCC)    status post ICD implantation    ALLERGIES:  is allergic to bee venom, latex, metformin, rivaroxaban, sotalol, and warfarin sodium.  MEDICATIONS:  Current Outpatient Medications  Medication Sig Dispense Refill   acetaminophen (TYLENOL) 500 MG tablet Take 2 tablets (1,000 mg total) by mouth every 8 (eight) hours as needed for mild pain, moderate pain or headache. 30 tablet 0   atorvastatin (LIPITOR) 80 MG tablet Take 1 tablet (80 mg total) by mouth at bedtime. Hold until liver function has normalized.     cetirizine (ZYRTEC) 10 MG tablet Take 1 tablet by mouth daily as needed.     Cholecalciferol (VITAMIN D) 125 MCG (5000 UT) CAPS Take 5,000-10,000 Units by mouth See admin instructions. Take 10000 units by mouth on Monday, Wednesday and Friday and take 5000 units on Tuesday, Thursday, Saturday and Sunday     dofetilide ([name]) 500 MCG capsule Take 1 capsule (500 mcg total) by mouth 2 (two) times daily. 180 capsule 3  ELIQUIS 5 MG TABS tablet TAKE ONE TABLET BY MOUTH TWICE DAILY 180 tablet 1   EPIPEN 2-PAK 0.3 MG/0.3ML SOAJ injection Inject 0.3 mg into the muscle daily as needed for anaphylaxis.      Erenumab-aooe (AIMOVIG) 70 MG/ML SOAJ Inject 70 mg into the skin every 30 (thirty) days. 1 pen 5   fluticasone (FLONASE) 50 MCG/ACT nasal spray Place 1 spray into  the nose daily as needed for rhinitis or allergies.     furosemide (LASIX) 20 MG tablet Take 1 tablet (20 mg total) by mouth daily as needed for fluid. 90 tablet 3   Magnesium 250 MG TABS Take 250 mg by mouth daily.      metoprolol succinate (TOPROL-XL) 50 MG 24 hr tablet Take 50 mg by mouth daily. Take with or immediately following a meal.     nateglinide (STARLIX) 120 MG tablet Take 120 mg by mouth 2 (two) times daily before a meal.      pioglitazone (ACTOS) 45 MG tablet Take 45 mg by mouth daily.       sitaGLIPtin (JANUVIA) 100 MG tablet Take 100 mg by mouth at bedtime.      Tiotropium Bromide-Olodaterol (STIOLTO RESPIMAT) 2.5-2.5 MCG/ACT AERS Inhale 2 puffs into the lungs daily. 4 g 12   No current facility-administered medications for this visit.    SURGICAL HISTORY:  Past Surgical History:  Procedure Laterality Date   CARDIAC CATHETERIZATION  04/03/2007   EF 40%   CARDIAC CATHETERIZATION  02/06/2006   EF 45%. ANTERIOR HYPOKINESIS   CARDIAC CATHETERIZATION  05/29/2000   EF 50%. MILD ANTERIOR HYPOKINESIS   CARDIAC CATHETERIZATION  10/25/1999   EF 50%. SEVERE MITRAL REGURGITATION   CARDIAC CATHETERIZATION  01/06/2003   EF 50%   CARDIAC DEFIBRILLATOR PLACEMENT     CARDIOVERSION N/A 02/07/2019   Procedure: CARDIOVERSION;  Surgeon: Fay Records, MD;  Location: [facility];  Service: Cardiovascular;  Laterality: N/A;   CATARACT EXTRACTION W/ INTRAOCULAR LENS  IMPLANT, BILATERAL     CHOLECYSTECTOMY N/A 01/08/2019   Procedure: LAPAROSCOPIC CHOLECYSTECTOMY WITH INTRAOPERATIVE CHOLANGIOGRAM;  Surgeon: Donnie Mesa, MD;  Location: [facility];  Service: General;  Laterality: N/A;   CORONARY ARTERY BYPASS GRAFT  2001   2 VESSEL CABG AND MITRAL VALVE REPAIR. HE HAD A LIMA GRAFT TO THE LAD AND RADIAL ARTERY  GRAFT TO THE OBTUSE MARGINAL  OF LEFT CIRCUMFLEX   ENDARTERECTOMY Right 11/07/2018   Procedure: ENDARTERECTOMY CAROTID RIGHT;  Surgeon: Waynetta Sandy, MD;  Location: [facility];  Service: Vascular;  Laterality: Right;   EP IMPLANTABLE DEVICE N/A 11/06/2014   Procedure: ICD Generator Changeout;  Surgeon: Deboraha Sprang, MD;  Location: [facility];  Service: Cardiovascular;  Laterality: N/A;   ERCP N/A 01/10/2019   Procedure: ENDOSCOPIC RETROGRADE CHOLANGIOPANCREATOGRAPHY (ERCP);  Surgeon: Clarene Essex, MD;  Location: [facility];  Service: Endoscopy;  Laterality: N/A;   FOREIGN BODY REMOVAL Right 06/21/2017   Procedure: FOREIGN BODY REMOVAL ADULT RIGHT RING FINGER;  Surgeon: Leanora Cover, MD;  Location: [facility];  Service: Orthopedics;  Laterality: Right;   HEMORRHOID SURGERY     HEMORROIDECTOMY     INTRAVASCULAR PRESSURE WIRE/FFR STUDY N/A 01/05/2017   Procedure: INTRAVASCULAR PRESSURE WIRE/FFR STUDY;  Surgeon: Sherren Mocha, MD;  Location: [facility];  Service: Cardiovascular;  Laterality: N/A;   KNEE ARTHROSCOPY     RIGHT KNEE   LAPAROSCOPIC APPENDECTOMY N/A 01/08/2019   Procedure: APPENDECTOMY LAPAROSCOPIC;  Surgeon: Donnie Mesa, MD;  Location: [facility];  Service: General;  Laterality: N/A;   LEFT HEART CATHETERIZATION WITH CORONARY ANGIOGRAM N/A 07/04/2012   Procedure: LEFT HEART CATHETERIZATION WITH CORONARY ANGIOGRAM;  Surgeon: Sherren Mocha, MD;  Location: [facility];  Service: Cardiovascular;  Laterality: N/A;   MITRAL VALVE REPLACEMENT     No LAA occlusion at the time of surgery  Karie Soda report 2018   REMOVAL OF STONES  01/10/2019   Procedure: REMOVAL OF STONES;  Surgeon: Clarene Essex, MD;  Location: [facility];  Service: Endoscopy;;   RIGHT/LEFT HEART CATH AND CORONARY/GRAFT ANGIOGRAPHY N/A 01/05/2017   Procedure: RIGHT/LEFT HEART CATH AND CORONARY/GRAFT ANGIOGRAPHY;  Surgeon: Sherren Mocha, MD;  Location: [facility];  Service: Cardiovascular;  Laterality: N/A;   SPHINCTEROTOMY  01/10/2019   Procedure: SPHINCTEROTOMY;  Surgeon: Clarene Essex, MD;  Location: [facility];  Service: Endoscopy;;   TRANSTHORACIC  ECHOCARDIOGRAM  04/02/2007   EF 35%    REVIEW OF SYSTEMS:   Review of Systems  Constitutional: Negative for appetite change, chills, fatigue, fever and unexpected weight change.  HENT: Negative for mouth sores, nosebleeds, sore throat and trouble swallowing.   Eyes: Negative for eye problems and icterus.  Respiratory: Positive for dyspnea on exertion. negative for cough, hemoptysis, and wheezing.   Cardiovascular: Negative for chest pain and leg swelling.  Gastrointestinal: Negative for abdominal pain, constipation, diarrhea, nausea and vomiting.  Genitourinary: Negative for bladder incontinence, difficulty urinating, dysuria, frequency and hematuria.   Musculoskeletal: Positive for knee pain secondary to osteoarthritis. negative for back pain, gait problem, neck pain and neck stiffness.  Skin: Negative for itching and rash.  Neurological: Negative for dizziness, extremity weakness, gait problem, headaches, light-headedness and seizures.  Hematological: Positive for easy bruising secondary to his blood thinner.  Negative for adenopathy. Does not bleed easily.  Psychiatric/Behavioral: Negative for confusion, depression and sleep disturbance. The patient is not nervous/anxious.     PHYSICAL EXAMINATION:  Blood pressure 104/83, pulse 83, temperature (!) 96.5 F (35.8 C), temperature source Tympanic, resp. rate 17, height _0  (1.854 Tony), weight 218 lb 6.4 oz (99.1 kg), SpO2 100 %.  ECOG PERFORMANCE STATUS: 0 - Asymptomatic  Physical Exam  Constitutional: Oriented to person, place, and time and well-developed, well-nourished, and in no distress.  HENT:  Head: Normocephalic and atraumatic.  Mouth/Throat: Oropharynx is clear and moist. No oropharyngeal exudate.  Eyes: Conjunctivae are normal. Right eye exhibits no discharge. Left eye exhibits no discharge. No scleral icterus.  Neck: Normal range of motion. Neck supple.  Cardiovascular: Normal rate, regular rhythm, normal heart sounds and  intact distal pulses.   Pulmonary/Chest: Effort normal and breath sounds normal. No respiratory distress. No wheezes. No rales.  Abdominal: Soft. Bowel sounds are normal. Exhibits no distension and no mass. There is no tenderness.  Musculoskeletal: Normal range of motion. Exhibits no edema.  Lymphadenopathy:    No cervical adenopathy.  Neurological: Alert and oriented to person, place, and time. Exhibits normal muscle tone. Gait normal. Coordination normal.  Skin: Bruise on the left dorsal aspect of his hand. Skin is warm and dry. No rash noted. Not diaphoretic. No erythema. No pallor.  Psychiatric: Mood, memory and judgment normal.  Vitals reviewed.  LABORATORY DATA: Lab Results  Component Value Date   WBC 4.6 10/03/2019   HGB 13.9 10/03/2019   HCT 41.1 10/03/2019   MCV 104.6 (H) 10/03/2019   PLT 132 (L) 10/03/2019      Chemistry      Component Value Date/Time   NA 135 09/24/2019 1139  K 4.8 09/24/2019 1139   CL 99 09/24/2019 1139   CO2 27 09/24/2019 1139   BUN 12 09/24/2019 1139   CREATININE 1.05 09/24/2019 1139   CREATININE 1.08 01/04/2016 1630      Component Value Date/Time   CALCIUM 9.3 09/24/2019 1139   ALKPHOS 284 (H) 01/14/2019 0423   AST 30 01/14/2019 0423   ALT 90 (H) 01/14/2019 0423   BILITOT 1.4 (H) 01/14/2019 0423   BILITOT 1.4 (H) 10/23/2018 1454       RADIOGRAPHIC STUDIES:  CT BIOPSY  Result Date: 10/03/2019 INDICATION: Pancytopenia EXAM: CT GUIDED RIGHT ILIAC BONE MARROW ASPIRATION AND CORE BIOPSY Date:  10/03/2019 10/03/2019 12:03 pm Radiologist:  Jerilynn Mages. Daryll Brod, MD Guidance:  CT FLUOROSCOPY TIME:  Fluoroscopy Time: None. MEDICATIONS: 1% lidocaine ANESTHESIA/SEDATION: 2.0 mg IV Versed; 100 mcg IV Fentanyl Moderate Sedation Time:  10 minutes The patient was continuously monitored during the procedure by the interventional radiology nurse under my direct supervision. CONTRAST:  None. COMPLICATIONS: None PROCEDURE: Informed consent was obtained from the  patient following explanation of the procedure, risks, benefits and alternatives. The patient understands, agrees and consents for the procedure. All questions were addressed. A time out was performed. The patient was positioned prone and non-contrast localization CT was performed of the pelvis to demonstrate the iliac marrow spaces. Maximal barrier sterile technique utilized including caps, mask, sterile gowns, sterile gloves, large sterile drape, hand hygiene, and Betadine prep. Under sterile conditions and local anesthesia, an 11 gauge coaxial bone biopsy needle was advanced into the right iliac marrow space. Needle position was confirmed with CT imaging. Initially, bone marrow aspiration was performed. Next, the 11 gauge outer cannula was utilized to obtain a right iliac bone marrow core biopsy. Needle was removed. Hemostasis was obtained with compression. The patient tolerated the procedure well. Samples were prepared with the cytotechnologist. No immediate complications. IMPRESSION: CT guided right iliac bone marrow aspiration and core biopsy. Electronically Signed   By: Jerilynn Mages.  Shick Tony.D.   On: 10/03/2019 12:06   CT BONE MARROW BIOPSY & ASPIRATION  Result Date: 10/03/2019 INDICATION: Pancytopenia EXAM: CT GUIDED RIGHT ILIAC BONE MARROW ASPIRATION AND CORE BIOPSY Date:  10/03/2019 10/03/2019 12:03 pm Radiologist:  Jerilynn Mages. Daryll Brod, MD Guidance:  CT FLUOROSCOPY TIME:  Fluoroscopy Time: None. MEDICATIONS: 1% lidocaine ANESTHESIA/SEDATION: 2.0 mg IV Versed; 100 mcg IV Fentanyl Moderate Sedation Time:  10 minutes The patient was continuously monitored during the procedure by the interventional radiology nurse under my direct supervision. CONTRAST:  None. COMPLICATIONS: None PROCEDURE: Informed consent was obtained from the patient following explanation of the procedure, risks, benefits and alternatives. The patient understands, agrees and consents for the procedure. All questions were addressed. A time out was  performed. The patient was positioned prone and non-contrast localization CT was performed of the pelvis to demonstrate the iliac marrow spaces. Maximal barrier sterile technique utilized including caps, mask, sterile gowns, sterile gloves, large sterile drape, hand hygiene, and Betadine prep. Under sterile conditions and local anesthesia, an 11 gauge coaxial bone biopsy needle was advanced into the right iliac marrow space. Needle position was confirmed with CT imaging. Initially, bone marrow aspiration was performed. Next, the 11 gauge outer cannula was utilized to obtain a right iliac bone marrow core biopsy. Needle was removed. Hemostasis was obtained with compression. The patient tolerated the procedure well. Samples were prepared with the cytotechnologist. No immediate complications. IMPRESSION: CT guided right iliac bone marrow aspiration and core biopsy. Electronically Signed   By: Jerilynn Mages.  Shick Tony.D.   On: 10/03/2019 12:06   CUP PACEART INCLINIC DEVICE CHECK  Result Date: 09/24/2019 CRT-D device check in office, no LV lead. Thresholds and sensing consistent with previous device measurements. Lead impedance trends stable over time. 10.6% AT/AF burden, 35 treated episodes (11.4% success), on Eliquis and [name]. Presented in AT/A-flutter, but converted to AP/VP rhythm, base rate increased to 70bpm with improvement in PAC frequency. 23 NSVT, longest 7 sec. Device programmed with appropriate safety margins. Heart failure diagnostics reviewed and trends are stable for patient. Audible alerts demonstrated for patient. Estimated longevity 2.4 years. Patient enrolled in remote follow up. Patient education completed including shock plan. [facility] on 11/07/19 and ROV with [facility] in 6 months.Levander Campion BSN, RN, CCDS    ASSESSMENT/PLAN:  This is a very pleasant 86 year old Caucasian male with intermittent anemia, thrombocytopenia, and or leukopenia.  The patient had several labs studies performed including vitamin  B12, folate, iron studies, ferritin, and stool cards which were unremarkable. He had a bone marrow biopsy and aspirate performed which was unremarkable and his labs are likely reactive in nature.   The patient was seen with Dr. Julien Nordmann who reviewed the results with the patient. Dr. Julien Nordmann did advise the patient to cut back on his alcohol use which may be contributing to his macrocytic anemia and mild thrombocytopenia.    No further work up is necessary. We will not schedule any follow up appointments at this time but would be happy to see the patient on an as needed basis if he has worsening anemia or thrombocytopenia and becomes symptomatic. He will follow with his primary care provider for now.    The patient was advised to call immediately if he has any concerning symptoms in the interval. The patient voices understanding of current disease status and treatment options and is in agreement with the current care plan. All questions were answered. The patient knows to call the clinic with any problems, questions or concerns. We can certainly see the patient much sooner if necessary  No orders of the defined types were placed in this encounter.    Analeigh Aries L Emina Ribaudo, PA-C 10/13/19  ADDENDUM: Hematology/Oncology Attending: I had a face-to-face encounter with the patient today.  I recommended his care plan.  This is a very pleasant 86 years old white male presented for evaluation of intermittent anemia and thrombocytopenia.  The patient has several studies performed recently that were unremarkable.  We also recommended for the patient to have a bone marrow biopsy and aspirate that was performed recently and showed no concerning myelodysplastic or myeloproliferative disorder and the whole process likely reactive in nature. Repeat CBC recently showed no evidence of anemia but the patient continues to have mild thrombocytopenia. I recommended for the patient to continue on observation and  routine follow-up visit by his primary care physician at this point. He was advised to call immediately if he has any concerning symptoms in the interval.  Disclaimer: This note was dictated with voice recognition software. Similar sounding words can inadvertently be transcribed and may be missed upon review. Eilleen Kempf, MD 10/13/19

## 2019-10-10 ENCOUNTER — Encounter (HOSPITAL_COMMUNITY): Payer: Self-pay

## 2019-10-13 ENCOUNTER — Inpatient Hospital Stay: Payer: Medicare Other | Attending: Physician Assistant | Admitting: Physician Assistant

## 2019-10-13 ENCOUNTER — Other Ambulatory Visit: Payer: Self-pay

## 2019-10-13 ENCOUNTER — Encounter: Payer: Self-pay | Admitting: Physician Assistant

## 2019-10-13 VITALS — BP 104/83 | HR 83 | Temp 96.5°F | Resp 17 | Ht 73.0 in | Wt 218.4 lb

## 2019-10-13 DIAGNOSIS — D7589 Other specified diseases of blood and blood-forming organs: Secondary | ICD-10-CM

## 2019-10-13 DIAGNOSIS — I6523 Occlusion and stenosis of bilateral carotid arteries: Secondary | ICD-10-CM | POA: Diagnosis not present

## 2019-10-13 DIAGNOSIS — D696 Thrombocytopenia, unspecified: Secondary | ICD-10-CM | POA: Diagnosis not present

## 2019-10-14 ENCOUNTER — Telehealth: Payer: Self-pay

## 2019-10-14 ENCOUNTER — Encounter (HOSPITAL_COMMUNITY): Payer: Self-pay | Admitting: Internal Medicine

## 2019-10-14 NOTE — Telephone Encounter (Signed)
PROGRESS NOTE: Per Cassie PA faxed  the office note from yesterday (10/13/19) to patients PCP Dr. Sueanne Margarita at [facility] [phone] Office# [phone]  Fax came back as complete and placed in bin at [facility] desk.

## 2019-10-16 DIAGNOSIS — D692 Other nonthrombocytopenic purpura: Secondary | ICD-10-CM | POA: Diagnosis not present

## 2019-10-16 DIAGNOSIS — Z23 Encounter for immunization: Secondary | ICD-10-CM | POA: Diagnosis not present

## 2019-10-16 DIAGNOSIS — D1801 Hemangioma of skin and subcutaneous tissue: Secondary | ICD-10-CM | POA: Diagnosis not present

## 2019-10-16 DIAGNOSIS — Z85828 Personal history of other malignant neoplasm of skin: Secondary | ICD-10-CM | POA: Diagnosis not present

## 2019-10-16 DIAGNOSIS — L821 Other seborrheic keratosis: Secondary | ICD-10-CM | POA: Diagnosis not present

## 2019-10-16 DIAGNOSIS — D225 Melanocytic nevi of trunk: Secondary | ICD-10-CM | POA: Diagnosis not present

## 2019-10-21 DIAGNOSIS — R040 Epistaxis: Secondary | ICD-10-CM | POA: Diagnosis not present

## 2019-10-29 DIAGNOSIS — Z23 Encounter for immunization: Secondary | ICD-10-CM | POA: Diagnosis not present

## 2019-11-05 DIAGNOSIS — D692 Other nonthrombocytopenic purpura: Secondary | ICD-10-CM | POA: Diagnosis not present

## 2019-11-05 DIAGNOSIS — D72819 Decreased white blood cell count, unspecified: Secondary | ICD-10-CM | POA: Diagnosis not present

## 2019-11-05 DIAGNOSIS — E1169 Type 2 diabetes mellitus with other specified complication: Secondary | ICD-10-CM | POA: Diagnosis not present

## 2019-11-05 DIAGNOSIS — D696 Thrombocytopenia, unspecified: Secondary | ICD-10-CM | POA: Diagnosis not present

## 2019-11-05 DIAGNOSIS — I480 Paroxysmal atrial fibrillation: Secondary | ICD-10-CM | POA: Diagnosis not present

## 2019-11-05 DIAGNOSIS — J449 Chronic obstructive pulmonary disease, unspecified: Secondary | ICD-10-CM | POA: Diagnosis not present

## 2019-11-07 ENCOUNTER — Telehealth: Payer: Self-pay | Admitting: Emergency Medicine

## 2019-11-07 ENCOUNTER — Ambulatory Visit (INDEPENDENT_AMBULATORY_CARE_PROVIDER_SITE_OTHER): Payer: Medicare Other

## 2019-11-07 DIAGNOSIS — I472 Ventricular tachycardia: Secondary | ICD-10-CM

## 2019-11-07 DIAGNOSIS — I4729 Other ventricular tachycardia: Secondary | ICD-10-CM

## 2019-11-07 LAB — CUP PACEART REMOTE DEVICE CHECK
Battery Remaining Longevity: 25 mo
Battery Voltage: 2.93 V
Brady Statistic AP VP Percent: 58.33 %
Brady Statistic AP VS Percent: 1.73 %
Brady Statistic AS VP Percent: 33.49 %
Brady Statistic AS VS Percent: 6.44 %
Brady Statistic RA Percent Paced: 57.76 %
Brady Statistic RV Percent Paced: 89.96 %
Date Time Interrogation Session: 20211008022722
HighPow Impedance: 37 Ohm
HighPow Impedance: 51 Ohm
Implantable Lead Implant Date: 20040309
Implantable Lead Implant Date: 20040309
Implantable Lead Location: 753859
Implantable Lead Location: 753860
Implantable Lead Model: 158
Implantable Lead Model: 5076
Implantable Lead Serial Number: 115061
Implantable Pulse Generator Implant Date: 20161007
Lead Channel Impedance Value: 361 Ohm
Lead Channel Impedance Value: 399 Ohm
Lead Channel Impedance Value: 4047 Ohm
Lead Channel Impedance Value: 4047 Ohm
Lead Channel Impedance Value: 4047 Ohm
Lead Channel Impedance Value: 4047 Ohm
Lead Channel Impedance Value: 4047 Ohm
Lead Channel Impedance Value: 4047 Ohm
Lead Channel Impedance Value: 4047 Ohm
Lead Channel Impedance Value: 4047 Ohm
Lead Channel Impedance Value: 4047 Ohm
Lead Channel Impedance Value: 4047 Ohm
Lead Channel Impedance Value: 418 Ohm
Lead Channel Pacing Threshold Amplitude: 0.5 V
Lead Channel Pacing Threshold Amplitude: 0.625 V
Lead Channel Pacing Threshold Pulse Width: 0.4 ms
Lead Channel Pacing Threshold Pulse Width: 0.4 ms
Lead Channel Sensing Intrinsic Amplitude: 0.875 mV
Lead Channel Sensing Intrinsic Amplitude: 0.875 mV
Lead Channel Sensing Intrinsic Amplitude: 7.25 mV
Lead Channel Sensing Intrinsic Amplitude: 7.25 mV
Lead Channel Setting Pacing Amplitude: 2 V
Lead Channel Setting Pacing Amplitude: 2.5 V
Lead Channel Setting Pacing Pulse Width: 0.4 ms
Lead Channel Setting Sensing Sensitivity: 0.3 mV

## 2019-11-07 NOTE — Telephone Encounter (Signed)
Patient called in response to myChart message. Aware that his remote transmission was received. Confirmed that he has been in AF since 11/02/19, + Eliquis with no missed doses of [name] 500 mg BID or Toprol Xl 50 mg QD. He reports SOB with activity but no other s/sx. Patient is agreeable to visit to AF Clinic.Patient was seen by Roderic Palau Np in AF Clinic in 2019.

## 2019-11-07 NOTE — Telephone Encounter (Signed)
Called and spoke with patient, he is aware of appt 11/11/19 at 3 pm with Roderic Palau, NP.

## 2019-11-11 ENCOUNTER — Other Ambulatory Visit: Payer: Self-pay

## 2019-11-11 ENCOUNTER — Ambulatory Visit (HOSPITAL_COMMUNITY)
Admission: RE | Admit: 2019-11-11 | Discharge: 2019-11-11 | Disposition: A | Discharge status: Home / Self Care | Payer: Medicare Other | Source: Ambulatory Visit | Attending: Nurse Practitioner | Admitting: Nurse Practitioner

## 2019-11-11 ENCOUNTER — Encounter (HOSPITAL_COMMUNITY): Payer: Self-pay | Admitting: Nurse Practitioner

## 2019-11-11 VITALS — BP 108/72 | HR 79 | Ht 73.0 in | Wt 218.0 lb

## 2019-11-11 DIAGNOSIS — Z8673 Personal history of transient ischemic attack (TIA), and cerebral infarction without residual deficits: Secondary | ICD-10-CM | POA: Insufficient documentation

## 2019-11-11 DIAGNOSIS — Z7901 Long term (current) use of anticoagulants: Secondary | ICD-10-CM | POA: Insufficient documentation

## 2019-11-11 DIAGNOSIS — D6869 Other thrombophilia: Secondary | ICD-10-CM | POA: Diagnosis not present

## 2019-11-11 DIAGNOSIS — I4819 Other persistent atrial fibrillation: Secondary | ICD-10-CM | POA: Diagnosis not present

## 2019-11-11 DIAGNOSIS — I4892 Unspecified atrial flutter: Secondary | ICD-10-CM | POA: Diagnosis not present

## 2019-11-11 DIAGNOSIS — Z87891 Personal history of nicotine dependence: Secondary | ICD-10-CM | POA: Diagnosis not present

## 2019-11-11 DIAGNOSIS — Z95810 Presence of automatic (implantable) cardiac defibrillator: Secondary | ICD-10-CM | POA: Diagnosis not present

## 2019-11-11 DIAGNOSIS — Z79899 Other long term (current) drug therapy: Secondary | ICD-10-CM | POA: Diagnosis not present

## 2019-11-11 DIAGNOSIS — I4891 Unspecified atrial fibrillation: Secondary | ICD-10-CM

## 2019-11-11 DIAGNOSIS — I2510 Atherosclerotic heart disease of native coronary artery without angina pectoris: Secondary | ICD-10-CM | POA: Diagnosis not present

## 2019-11-11 DIAGNOSIS — I5043 Acute on chronic combined systolic (congestive) and diastolic (congestive) heart failure: Secondary | ICD-10-CM | POA: Insufficient documentation

## 2019-11-11 DIAGNOSIS — Z951 Presence of aortocoronary bypass graft: Secondary | ICD-10-CM | POA: Diagnosis not present

## 2019-11-11 LAB — BASIC METABOLIC PANEL
Anion gap: 6 (ref 5–15)
BUN: 16 mg/dL (ref 8–23)
CO2: 27 mmol/L (ref 22–32)
Calcium: 9.2 mg/dL (ref 8.9–10.3)
Chloride: 100 mmol/L (ref 98–111)
Creatinine, Ser: 1.05 mg/dL (ref 0.61–1.24)
GFR, Estimated: >60 mL/min (ref >60)
Glucose, Bld: 133 mg/dL — ABNORMAL HIGH (ref 70–99)
Potassium: 4.8 mmol/L (ref 3.5–5.1)
Sodium: 133 mmol/L — ABNORMAL LOW (ref 135–145)

## 2019-11-11 LAB — CBC
HCT: 38.4 % — ABNORMAL LOW (ref 39.0–52.0)
Hemoglobin: 12.5 g/dL — ABNORMAL LOW (ref 13.0–17.0)
MCH: 34.1 pg — ABNORMAL HIGH (ref 26.0–34.0)
MCHC: 32.6 g/dL (ref 30.0–36.0)
MCV: 104.6 fL — ABNORMAL HIGH (ref 80.0–100.0)
Platelets: 121 10*3/uL — ABNORMAL LOW (ref 150–400)
RBC: 3.67 MIL/uL — ABNORMAL LOW (ref 4.22–5.81)
RDW: 12.4 % (ref 11.5–15.5)
WBC: 4.4 10*3/uL (ref 4.0–10.5)
nRBC: 0 % (ref 0.0–0.2)

## 2019-11-11 NOTE — Progress Notes (Signed)
Primary Care Physician: Sueanne Margarita, DO Referring Physician: Dr. [facility]   [name] is a 86 y.o. male with a h/o paroxysmal afib, CAD, TIA, CHF, ICD with polymorphic v tach as well as persistent afib. He is in the clinic for persistent afib since 10/3 per the device clinic. He feels more winded in afib. He has not missed any  eliquis for at least 3 weeks and has had both vaccines and his booster. Ekg shows v paced rhythm today. He states no known trigger.   Today, he denies symptoms of palpitations, chest pain, shortness of breath, orthopnea, PND, lower extremity edema, dizziness, presyncope, syncope, or neurologic sequela. The patient is tolerating medications without difficulties and is otherwise without complaint today.   Past Medical History:  Diagnosis Date  . Acute on chronic combined systolic (congestive) and diastolic (congestive) heart failure ([place])   . AICD (automatic cardioverter/defibrillator) present    Medtronicn PPM/ICD  . Carotid stenosis, right    s/p endarterectomy 11/07/18  . Chronic combined systolic (congestive) and diastolic (congestive) heart failure ([place])   . Coronary artery disease    [facility]  [facility]  . Diabetes mellitus   . Dysrhythmia   . Endocarditis, valve unspecified    Mitral  . Headache    hx migraines 6-7 x mo  . History of migraine headaches   . History of seasonal allergies   . ICD (implantable cardiac defibrillator) in place   . PAF (paroxysmal atrial fibrillation) ([place])    NO LAA occlusion at the time of surgery  [name] 2018  . Pleural effusion   . PVC's (premature ventricular contractions)   . Ventricular tachycardia, polymorphic ([place])    status post ICD implantation   Past Surgical History:  Procedure Laterality Date  . CARDIAC CATHETERIZATION  04/03/2007   EF 40%  . CARDIAC CATHETERIZATION  02/06/2006   EF 45%. ANTERIOR HYPOKINESIS  . CARDIAC CATHETERIZATION  05/29/2000   EF 50%. MILD ANTERIOR  HYPOKINESIS  . CARDIAC CATHETERIZATION  10/25/1999   EF 50%. SEVERE MITRAL REGURGITATION  . CARDIAC CATHETERIZATION  01/06/2003   EF 50%  . CARDIAC DEFIBRILLATOR PLACEMENT    . CARDIOVERSION N/A 02/07/2019   Procedure: CARDIOVERSION;  Surgeon: Fay Records, MD;  Location: [facility];  Service: Cardiovascular;  Laterality: N/A;  . CATARACT EXTRACTION W/ INTRAOCULAR LENS  IMPLANT, BILATERAL    . CHOLECYSTECTOMY N/A 01/08/2019   Procedure: LAPAROSCOPIC CHOLECYSTECTOMY WITH INTRAOPERATIVE CHOLANGIOGRAM;  Surgeon: Donnie Mesa, MD;  Location: [place];  Service: General;  Laterality: N/A;  . CORONARY ARTERY BYPASS GRAFT  2001   2 VESSEL CABG AND MITRAL VALVE REPAIR. HE HAD A LIMA GRAFT TO THE LAD AND RADIAL ARTERY  GRAFT TO THE OBTUSE MARGINAL  OF LEFT CIRCUMFLEX  . ENDARTERECTOMY Right 11/07/2018   Procedure: ENDARTERECTOMY CAROTID RIGHT;  Surgeon: Waynetta Sandy, MD;  Location: [facility];  Service: Vascular;  Laterality: Right;  . EP IMPLANTABLE DEVICE N/A 11/06/2014   Procedure: ICD Generator Changeout;  Surgeon: Deboraha Sprang, MD;  Location: [facility];  Service: Cardiovascular;  Laterality: N/A;  . ERCP N/A 01/10/2019   Procedure: ENDOSCOPIC RETROGRADE CHOLANGIOPANCREATOGRAPHY (ERCP);  Surgeon: Clarene Essex, MD;  Location: [facility];  Service: Endoscopy;  Laterality: N/A;  . FOREIGN BODY REMOVAL Right 06/21/2017   Procedure: FOREIGN BODY REMOVAL ADULT RIGHT RING FINGER;  Surgeon: Leanora Cover, MD;  Location: [facility] [place];  Service: Orthopedics;  Laterality: Right;  . HEMORRHOID SURGERY    .  HEMORROIDECTOMY    . INTRAVASCULAR PRESSURE WIRE/FFR STUDY N/A 01/05/2017   Procedure: INTRAVASCULAR PRESSURE WIRE/FFR STUDY;  Surgeon: Sherren Mocha, MD;  Location: [facility];  Service: Cardiovascular;  Laterality: N/A;  . KNEE ARTHROSCOPY     RIGHT KNEE  . LAPAROSCOPIC APPENDECTOMY N/A 01/08/2019   Procedure: APPENDECTOMY LAPAROSCOPIC;  Surgeon: Donnie Mesa, MD;  Location: [facility];   Service: General;  Laterality: N/A;  . LEFT HEART CATHETERIZATION WITH CORONARY ANGIOGRAM N/A 07/04/2012   Procedure: LEFT HEART CATHETERIZATION WITH CORONARY ANGIOGRAM;  Surgeon: Sherren Mocha, MD;  Location: [facility] C[facility];  Service: Cardiovascular;  Laterality: N/A;  . MITRAL VALVE REPLACEMENT     No LAA occlusion at the time of surgery  Karie Soda report 2018  . REMOVAL OF STONES  01/10/2019   Procedure: REMOVAL OF STONES;  Surgeon: Clarene Essex, MD;  Location: [facility];  Service: Endoscopy;;  . RIGHT/LEFT HEART CATH AND CORONARY/GRAFT ANGIOGRAPHY N/A 01/05/2017   Procedure: RIGHT/LEFT HEART CATH AND CORONARY/GRAFT ANGIOGRAPHY;  Surgeon: Sherren Mocha, MD;  Location: [facility];  Service: Cardiovascular;  Laterality: N/A;  . SPHINCTEROTOMY  01/10/2019   Procedure: SPHINCTEROTOMY;  Surgeon: Clarene Essex, MD;  Location: [facility];  Service: Endoscopy;;  . TRANSTHORACIC ECHOCARDIOGRAM  04/02/2007   EF 35%    Current Outpatient Medications  Medication Sig Dispense Refill  . acetaminophen (TYLENOL) 500 MG tablet Take 2 tablets (1,000 mg total) by mouth every 8 (eight) hours as needed for mild pain, moderate pain or headache. 30 tablet 0  . atorvastatin (LIPITOR) 80 MG tablet Take 1 tablet (80 mg total) by mouth at bedtime. Hold until liver function has normalized.    . cetirizine (ZYRTEC) 10 MG tablet Take 1 tablet by mouth daily as needed.    . Cholecalciferol (VITAMIN D) 125 MCG (5000 UT) CAPS Take 5,000-10,000 Units by mouth See admin instructions. Take 10000 units by mouth on Monday, Wednesday and Friday and take 5000 units on Tuesday, Thursday, Saturday and Sunday    . dofetilide ([name]) 500 MCG capsule Take 1 capsule (500 mcg total) by mouth 2 (two) times daily. 180 capsule 3  . ELIQUIS 5 MG TABS tablet TAKE ONE TABLET BY MOUTH TWICE DAILY 180 tablet 1  . EPIPEN 2-PAK 0.3 MG/0.3ML SOAJ injection Inject 0.3 mg into the muscle daily as needed for anaphylaxis.     Eduard Roux  (AIMOVIG) 70 MG/ML SOAJ Inject 70 mg into the skin every 30 (thirty) days. 1 pen 5  . fluticasone (FLONASE) 50 MCG/ACT nasal spray Place 1 spray into the nose daily as needed for rhinitis or allergies.    . Magnesium 250 MG TABS Take 250 mg by mouth daily.     . metoprolol succinate (TOPROL-XL) 50 MG 24 hr tablet Take 50 mg by mouth daily. Take with or immediately following a meal.    . nateglinide (STARLIX) 120 MG tablet Take 120 mg by mouth 2 (two) times daily before a meal.     . pioglitazone (ACTOS) 45 MG tablet Take 45 mg by mouth daily.      . sitaGLIPtin (JANUVIA) 100 MG tablet Take 100 mg by mouth at bedtime.     . Tiotropium Bromide-Olodaterol (STIOLTO RESPIMAT) 2.5-2.5 MCG/ACT AERS Inhale 2 puffs into the lungs daily. 4 g 12  . furosemide (LASIX) 20 MG tablet Take 1 tablet (20 mg total) by mouth daily as needed for fluid. (Patient not taking: Reported on 11/11/2019) 90 tablet 3   No current facility-administered medications  for this encounter.    Allergies  Allergen Reactions  . Bee Venom Anaphylaxis  . Latex Other (See Comments)    REDNESS AT REACTION SITE.  Marland Kitchen Metformin Diarrhea  . Rivaroxaban Other (See Comments)    Headaches, blurred vision  . Sotalol Other (See Comments)    "BLUE TOES"- cut his circulation off  . Warfarin Sodium Other (See Comments)    REACTION: migraine headaches/vision impariment    Social History   Socioeconomic History  . Marital status: Married    Spouse name: Mardene Celeste  . Number of children: 2  . Years of education: Not on file  . Highest education level: Associate degree: academic program  Occupational History  . Occupation: retired  Tobacco Use  . Smoking status: Former Smoker    Packs/day: 2.00    Years: 16.00    Pack years: 32.00    Types: Cigarettes    Start date: 02/23/1956    Quit date: 01/31/1971    Years since quitting: 48.8  . Smokeless tobacco: Never Used  Vaping Use  . Vaping Use: Never used  Substance and Sexual Activity   . Alcohol use: Yes    Alcohol/week: 0.0 standard drinks    Comment: 1-2 a week  . Drug use: No  . Sexual activity: Yes  Other Topics Concern  . Not on file  Social History Narrative   Patient is right-handed. He lives with his wife ina 2 story home. He drinks one cup of coffee and a diet coke a day.    Social Determinants of Health   Financial Resource Strain:   . Difficulty of Paying Living Expenses: Not on file  Food Insecurity:   . Worried About Charity fundraiser in the Last Year: Not on file  . Ran Out of Food in the Last Year: Not on file  Transportation Needs:   . Lack of Transportation (Medical): Not on file  . Lack of Transportation (Non-Medical): Not on file  Physical Activity:   . Days of Exercise per Week: Not on file  . Minutes of Exercise per Session: Not on file  Stress:   . Feeling of Stress : Not on file  Social Connections:   . Frequency of Communication with Friends and Family: Not on file  . Frequency of Social Gatherings with Friends and Family: Not on file  . Attends Religious Services: Not on file  . Active Member of Clubs or Organizations: Not on file  . Attends Archivist Meetings: Not on file  . Marital Status: Not on file  Intimate Partner Violence:   . Fear of Current or Ex-Partner: Not on file  . Emotionally Abused: Not on file  . Physically Abused: Not on file  . Sexually Abused: Not on file    Family History  Problem Relation Age of Onset  . Heart disease Father   . Emphysema Father   . Stroke Mother   . Cancer Sister        breast cancer  . Heart disease Paternal Grandmother   . Heart disease Paternal Grandfather   . Diabetes Other        grandmother, aunt, uncle  . Cancer Other        lung cancer, aunt    ROS- All systems are reviewed and negative except as per the HPI above  Physical Exam: Vitals:   11/11/19 1509  BP: 108/72  Pulse: 79  Weight: 98.9 kg  Height: 6\' 1"  (1.854 m)   Wt  Readings from Last 3  Encounters:  11/11/19 98.9 kg  10/13/19 99.1 kg  09/24/19 100 kg    Labs: Lab Results  Component Value Date   NA 135 09/24/2019   K 4.8 09/24/2019   CL 99 09/24/2019   CO2 27 09/24/2019   GLUCOSE 141 (H) 09/24/2019   BUN 12 09/24/2019   CREATININE 1.05 09/24/2019   CALCIUM 9.3 09/24/2019   PHOS 3.7 11/14/2018   MG 2.0 09/24/2019   Lab Results  Component Value Date   INR 1.3 (H) 10/03/2019   Lab Results  Component Value Date   CHOL 110 11/05/2018   HDL 48 11/05/2018   LDLCALC 44 11/05/2018   TRIG 90 11/05/2018     GEN- The patient is well appearing, alert and oriented x 3 today.   Head- normocephalic, atraumatic Eyes-  Sclera clear, conjunctiva pink Ears- hearing intact Oropharynx- clear Neck- supple, no JVP Lymph- no cervical lymphadenopathy Lungs- Clear to ausculation bilaterally, normal work of breathing Heart- Regular rate and rhythm, no murmurs, rubs or gallops, PMI not laterally displaced GI- soft, NT, ND, + BS Extremities- no clubbing, cyanosis, or edema MS- no significant deformity or atrophy Skin- no rash or lesion Psych- euthymic mood, full affect Neuro- strength and sensation are intact  EKG-v paced rhythm at 79 bpm    Assessment and Plan: 1. Persistent  afib  Persistent since 10/3 per device clinic Will schedule cardioversion  Continue dofetilide 500 mcg bid  Continue metoprolol succinate 50 mg daily   Has completed 3 covid vaccines  Bmet/mag today Covid test scheduled   2. CHA2DS2VASc score of at least 7  Continue eliquis 5 mg bid  States no missed doses in the last 3 weeks    F/u in afib clinic in one week s/p cardioversion  Butch Penny C. Nakota Ackert, [facility] [address] [place], [place] [address] [phone]

## 2019-11-11 NOTE — H&P (View-Only) (Signed)
Primary Care Physician: Sueanne Margarita, DO Referring Physician: Dr. Berner[facility]   [name] is a 86 y.o. male with a h/o paroxysmal afib, CAD, TIA, CHF, ICD with polymorphic v tach as well as persistent afib. He is in the clinic for persistent afib since 10/3 per the device clinic. He feels more winded in afib. He has not missed any  eliquis for at least 3 weeks and has had both vaccines and his booster. Ekg shows v paced rhythm today. He states no known trigger.   Today, he denies symptoms of palpitations, chest pain, shortness of breath, orthopnea, PND, lower extremity edema, dizziness, presyncope, syncope, or neurologic sequela. The patient is tolerating medications without difficulties and is otherwise without complaint today.   Past Medical History:  Diagnosis Date  . Acute on chronic combined systolic (congestive) and diastolic (congestive) heart failure ([place])   . AICD (automatic cardioverter/defibrillator) present    Medtronicn PPM/ICD  . Carotid stenosis, right    s/p endarterectomy 11/07/18  . Chronic combined systolic (congestive) and diastolic (congestive) heart failure ([place])   . Coronary artery disease    [place]  [facility]  . Diabetes mellitus   . Dysrhythmia   . Endocarditis, valve unspecified    Mitral  . Headache    hx migraines 6-7 x mo  . History of migraine headaches   . History of seasonal allergies   . ICD (implantable cardiac defibrillator) in place   . PAF (paroxysmal atrial fibrillation) ([place])    NO LAA occlusion at the time of surgery  [name] 2018  . Pleural effusion   . PVC's (premature ventricular contractions)   . Ventricular tachycardia, polymorphic ([place])    status post ICD implantation   Past Surgical History:  Procedure Laterality Date  . CARDIAC CATHETERIZATION  04/03/2007   EF 40%  . CARDIAC CATHETERIZATION  02/06/2006   EF 45%. ANTERIOR HYPOKINESIS  . CARDIAC CATHETERIZATION  05/29/2000   EF 50%. MILD ANTERIOR  HYPOKINESIS  . CARDIAC CATHETERIZATION  10/25/1999   EF 50%. SEVERE MITRAL REGURGITATION  . CARDIAC CATHETERIZATION  01/06/2003   EF 50%  . CARDIAC DEFIBRILLATOR PLACEMENT    . CARDIOVERSION N/A 02/07/2019   Procedure: CARDIOVERSION;  Surgeon: Fay Records, MD;  Location: [facility];  Service: Cardiovascular;  Laterality: N/A;  . CATARACT EXTRACTION W/ INTRAOCULAR LENS  IMPLANT, BILATERAL    . CHOLECYSTECTOMY N/A 01/08/2019   Procedure: LAPAROSCOPIC CHOLECYSTECTOMY WITH INTRAOPERATIVE CHOLANGIOGRAM;  Surgeon: Donnie Mesa, MD;  Location: [facility];  Service: General;  Laterality: N/A;  . CORONARY ARTERY BYPASS GRAFT  2001   2 VESSEL CABG AND MITRAL VALVE REPAIR. HE HAD A LIMA GRAFT TO THE LAD AND RADIAL ARTERY  GRAFT TO THE OBTUSE MARGINAL  OF LEFT CIRCUMFLEX  . ENDARTERECTOMY Right 11/07/2018   Procedure: ENDARTERECTOMY CAROTID RIGHT;  Surgeon: Waynetta Sandy, MD;  Location: [place];  Service: Vascular;  Laterality: Right;  . EP IMPLANTABLE DEVICE N/A 11/06/2014   Procedure: ICD Generator Changeout;  Surgeon: Deboraha Sprang, MD;  Location: [place] [facility];  Service: Cardiovascular;  Laterality: N/A;  . ERCP N/A 01/10/2019   Procedure: ENDOSCOPIC RETROGRADE CHOLANGIOPANCREATOGRAPHY (ERCP);  Surgeon: Clarene Essex, MD;  Location: [facility];  Service: Endoscopy;  Laterality: N/A;  . FOREIGN BODY REMOVAL Right 06/21/2017   Procedure: FOREIGN BODY REMOVAL ADULT RIGHT RING FINGER;  Surgeon: Leanora Cover, MD;  Location: [facility];  Service: Orthopedics;  Laterality: Right;  . HEMORRHOID SURGERY    .  HEMORROIDECTOMY    . INTRAVASCULAR PRESSURE WIRE/FFR STUDY N/A 01/05/2017   Procedure: INTRAVASCULAR PRESSURE WIRE/FFR STUDY;  Surgeon: Sherren Mocha, MD;  Location: [place] [facility];  Service: Cardiovascular;  Laterality: N/A;  . KNEE ARTHROSCOPY     RIGHT KNEE  . LAPAROSCOPIC APPENDECTOMY N/A 01/08/2019   Procedure: APPENDECTOMY LAPAROSCOPIC;  Surgeon: Donnie Mesa, MD;  Location: [place];   Service: General;  Laterality: N/A;  . LEFT HEART CATHETERIZATION WITH CORONARY ANGIOGRAM N/A 07/04/2012   Procedure: LEFT HEART CATHETERIZATION WITH CORONARY ANGIOGRAM;  Surgeon: Sherren Mocha, MD;  Location: [facility] CATH LAB;  Service: Cardiovascular;  Laterality: N/A;  . MITRAL VALVE REPLACEMENT     No LAA occlusion at the time of surgery  Karie Soda report 2018  . REMOVAL OF STONES  01/10/2019   Procedure: REMOVAL OF STONES;  Surgeon: Clarene Essex, MD;  Location: [place];  Service: Endoscopy;;  . RIGHT/LEFT HEART CATH AND CORONARY/GRAFT ANGIOGRAPHY N/A 01/05/2017   Procedure: RIGHT/LEFT HEART CATH AND CORONARY/GRAFT ANGIOGRAPHY;  Surgeon: Sherren Mocha, MD;  Location: [place] [facility];  Service: Cardiovascular;  Laterality: N/A;  . SPHINCTEROTOMY  01/10/2019   Procedure: SPHINCTEROTOMY;  Surgeon: Clarene Essex, MD;  Location: [place];  Service: Endoscopy;;  . TRANSTHORACIC ECHOCARDIOGRAM  04/02/2007   EF 35%    Current Outpatient Medications  Medication Sig Dispense Refill  . acetaminophen (TYLENOL) 500 MG tablet Take 2 tablets (1,000 mg total) by mouth every 8 (eight) hours as needed for mild pain, moderate pain or headache. 30 tablet 0  . atorvastatin (LIPITOR) 80 MG tablet Take 1 tablet (80 mg total) by mouth at bedtime. Hold until liver function has normalized.    . cetirizine (ZYRTEC) 10 MG tablet Take 1 tablet by mouth daily as needed.    . Cholecalciferol (VITAMIN D) 125 MCG (5000 UT) CAPS Take 5,000-10,000 Units by mouth See admin instructions. Take 10000 units by mouth on Monday, Wednesday and Friday and take 5000 units on Tuesday, Thursday, Saturday and Sunday    . dofetilide ([name]) 500 MCG capsule Take 1 capsule (500 mcg total) by mouth 2 (two) times daily. 180 capsule 3  . ELIQUIS 5 MG TABS tablet TAKE ONE TABLET BY MOUTH TWICE DAILY 180 tablet 1  . EPIPEN 2-PAK 0.3 MG/0.3ML SOAJ injection Inject 0.3 mg into the muscle daily as needed for anaphylaxis.     Eduard Roux  (AIMOVIG) 70 MG/ML SOAJ Inject 70 mg into the skin every 30 (thirty) days. 1 pen 5  . fluticasone (FLONASE) 50 MCG/ACT nasal spray Place 1 spray into the nose daily as needed for rhinitis or allergies.    . Magnesium 250 MG TABS Take 250 mg by mouth daily.     . metoprolol succinate (TOPROL-XL) 50 MG 24 hr tablet Take 50 mg by mouth daily. Take with or immediately following a meal.    . nateglinide (STARLIX) 120 MG tablet Take 120 mg by mouth 2 (two) times daily before a meal.     . pioglitazone (ACTOS) 45 MG tablet Take 45 mg by mouth daily.      . sitaGLIPtin (JANUVIA) 100 MG tablet Take 100 mg by mouth at bedtime.     . Tiotropium Bromide-Olodaterol (STIOLTO RESPIMAT) 2.5-2.5 MCG/ACT AERS Inhale 2 puffs into the lungs daily. 4 g 12  . furosemide (LASIX) 20 MG tablet Take 1 tablet (20 mg total) by mouth daily as needed for fluid. (Patient not taking: Reported on 11/11/2019) 90 tablet 3   No current facility-administered medications  for this encounter.    Allergies  Allergen Reactions  . Bee Venom Anaphylaxis  . Latex Other (See Comments)    REDNESS AT REACTION SITE.  Marland Kitchen Metformin Diarrhea  . Rivaroxaban Other (See Comments)    Headaches, blurred vision  . Sotalol Other (See Comments)    "BLUE TOES"- cut his circulation off  . Warfarin Sodium Other (See Comments)    REACTION: migraine headaches/vision impariment    Social History   Socioeconomic History  . Marital status: Married    Spouse name: Mardene Celeste  . Number of children: 2  . Years of education: Not on file  . Highest education level: Associate degree: academic program  Occupational History  . Occupation: retired  Tobacco Use  . Smoking status: Former Smoker    Packs/day: 2.00    Years: 16.00    Pack years: 32.00    Types: Cigarettes    Start date: 02/23/1956    Quit date: 01/31/1971    Years since quitting: 48.8  . Smokeless tobacco: Never Used  Vaping Use  . Vaping Use: Never used  Substance and Sexual Activity    . Alcohol use: Yes    Alcohol/week: 0.0 standard drinks    Comment: 1-2 a week  . Drug use: No  . Sexual activity: Yes  Other Topics Concern  . Not on file  Social History Narrative   Patient is right-handed. He lives with his wife ina 2 story home. He drinks one cup of coffee and a diet coke a day.    Social Determinants of Health   Financial Resource Strain:   . Difficulty of Paying Living Expenses: Not on file  Food Insecurity:   . Worried About Charity fundraiser in the Last Year: Not on file  . Ran Out of Food in the Last Year: Not on file  Transportation Needs:   . Lack of Transportation (Medical): Not on file  . Lack of Transportation (Non-Medical): Not on file  Physical Activity:   . Days of Exercise per Week: Not on file  . Minutes of Exercise per Session: Not on file  Stress:   . Feeling of Stress : Not on file  Social Connections:   . Frequency of Communication with Friends and Family: Not on file  . Frequency of Social Gatherings with Friends and Family: Not on file  . Attends Religious Services: Not on file  . Active Member of Clubs or Organizations: Not on file  . Attends Archivist Meetings: Not on file  . Marital Status: Not on file  Intimate Partner Violence:   . Fear of Current or Ex-Partner: Not on file  . Emotionally Abused: Not on file  . Physically Abused: Not on file  . Sexually Abused: Not on file    Family History  Problem Relation Age of Onset  . Heart disease Father   . Emphysema Father   . Stroke Mother   . Cancer Sister        breast cancer  . Heart disease Paternal Grandmother   . Heart disease Paternal Grandfather   . Diabetes Other        grandmother, aunt, uncle  . Cancer Other        lung cancer, aunt    ROS- All systems are reviewed and negative except as per the HPI above  Physical Exam: Vitals:   11/11/19 1509  BP: 108/72  Pulse: 79  Weight: 98.9 kg  Height: 6\' 1"  (1.854 m)  Wt Readings from Last 3  Encounters:  11/11/19 98.9 kg  10/13/19 99.1 kg  09/24/19 100 kg    Labs: Lab Results  Component Value Date   NA 135 09/24/2019   K 4.8 09/24/2019   CL 99 09/24/2019   CO2 27 09/24/2019   GLUCOSE 141 (H) 09/24/2019   BUN 12 09/24/2019   CREATININE 1.05 09/24/2019   CALCIUM 9.3 09/24/2019   PHOS 3.7 11/14/2018   MG 2.0 09/24/2019   Lab Results  Component Value Date   INR 1.3 (H) 10/03/2019   Lab Results  Component Value Date   CHOL 110 11/05/2018   HDL 48 11/05/2018   LDLCALC 44 11/05/2018   TRIG 90 11/05/2018     GEN- The patient is well appearing, alert and oriented x 3 today.   Head- normocephalic, atraumatic Eyes-  Sclera clear, conjunctiva pink Ears- hearing intact Oropharynx- clear Neck- supple, no JVP Lymph- no cervical lymphadenopathy Lungs- Clear to ausculation bilaterally, normal work of breathing Heart- Regular rate and rhythm, no murmurs, rubs or gallops, PMI not laterally displaced GI- soft, NT, ND, + BS Extremities- no clubbing, cyanosis, or edema MS- no significant deformity or atrophy Skin- no rash or lesion Psych- euthymic mood, full affect Neuro- strength and sensation are intact  EKG-v paced rhythm at 79 bpm    Assessment and Plan: 1. Persistent  afib  Persistent since 10/3 per device clinic Will schedule cardioversion  Continue dofetilide 500 mcg bid  Continue metoprolol succinate 50 mg daily   Has completed 3 covid vaccines  Bmet/mag today Covid test scheduled   2. CHA2DS2VASc score of at least 7  Continue eliquis 5 mg bid  States no missed doses in the last 3 weeks    F/u in afib clinic in one week s/p cardioversion  Butch Penny C. Kamaree Berkel, [facility] [address] [place], [place] [address] [phone]

## 2019-11-11 NOTE — Progress Notes (Signed)
Remote ICD transmission.   

## 2019-11-11 NOTE — Patient Instructions (Signed)
Cardioversion scheduled for Monday, October 25th  - Arrive at the [facility] and go to admitting at 930AM  - Do not eat or drink anything after midnight the night prior to your procedure.  - Take all your morning medication (except diabetic medications) with a sip of water prior to arrival.  - You will not be able to drive home after your procedure.  - Do NOT miss any doses of your blood thinner - if you should miss a dose please notify our office immediately.  - If you feel as if you go back into normal rhythm prior to scheduled cardioversion, please notify our office immediately. If your procedure is canceled in the cardioversion suite you will be charged a cancellation fee.

## 2019-11-12 ENCOUNTER — Encounter (HOSPITAL_COMMUNITY): Payer: Self-pay
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
## 2019-11-17 ENCOUNTER — Ambulatory Visit (INDEPENDENT_AMBULATORY_CARE_PROVIDER_SITE_OTHER): Payer: Medicare Other | Admitting: Neurology

## 2019-11-17 ENCOUNTER — Other Ambulatory Visit: Payer: Self-pay

## 2019-11-17 ENCOUNTER — Encounter: Payer: Self-pay | Admitting: Neurology

## 2019-11-17 VITALS — BP 113/75 | HR 71 | Resp 18 | Ht 73.0 in | Wt 212.0 lb

## 2019-11-17 DIAGNOSIS — I6523 Occlusion and stenosis of bilateral carotid arteries: Secondary | ICD-10-CM

## 2019-11-17 DIAGNOSIS — G43109 Migraine with aura, not intractable, without status migrainosus: Secondary | ICD-10-CM | POA: Diagnosis not present

## 2019-11-17 DIAGNOSIS — I6521 Occlusion and stenosis of right carotid artery: Secondary | ICD-10-CM

## 2019-11-17 DIAGNOSIS — G453 Amaurosis fugax: Secondary | ICD-10-CM | POA: Diagnosis not present

## 2019-11-17 MED ORDER — AIMOVIG 140 MG/ML ~~LOC~~ SOAJ: 140.0000 mg | SUBCUTANEOUS | 11 refills | Status: DC

## 2019-11-17 NOTE — Patient Instructions (Addendum)
1.  We will increase Aimovig to 140mg  every 28 days.  Prescription called into [facility]. 2.  Follow up in 6 months.

## 2019-11-18 ENCOUNTER — Telehealth: Payer: Self-pay | Admitting: Neurology

## 2019-11-20 NOTE — Telephone Encounter (Signed)
Has this been completed?  Sending to clinical staff for review: Okay to sign/close encounter or is further follow up needed? 

## 2019-11-21 ENCOUNTER — Other Ambulatory Visit (HOSPITAL_COMMUNITY)
Admission: RE | Admit: 2019-11-21 | Discharge: 2019-11-21 | Disposition: A | Discharge status: Home / Self Care | Payer: Medicare Other | Source: Ambulatory Visit | Attending: Cardiovascular Disease | Admitting: Cardiovascular Disease

## 2019-11-21 ENCOUNTER — Other Ambulatory Visit (HOSPITAL_COMMUNITY): Payer: Medicare Other

## 2019-11-21 DIAGNOSIS — Z20822 Contact with and (suspected) exposure to covid-19: Secondary | ICD-10-CM | POA: Insufficient documentation

## 2019-11-21 DIAGNOSIS — Z01812 Encounter for preprocedural laboratory examination: Secondary | ICD-10-CM | POA: Insufficient documentation

## 2019-11-21 LAB — SARS CORONAVIRUS 2 (TAT 6-24 HRS): SARS Coronavirus 2: NEGATIVE

## 2019-11-24 ENCOUNTER — Ambulatory Visit (HOSPITAL_COMMUNITY): Payer: Medicare Other | Admitting: Anesthesiology

## 2019-11-24 ENCOUNTER — Ambulatory Visit (HOSPITAL_COMMUNITY)
Admission: RE | Admit: 2019-11-24 | Discharge: 2019-11-24 | Disposition: A | Discharge status: Home / Self Care | Payer: Medicare Other | Attending: Cardiovascular Disease | Admitting: Cardiovascular Disease

## 2019-11-24 ENCOUNTER — Encounter (HOSPITAL_COMMUNITY): Payer: Self-pay | Admitting: Cardiovascular Disease

## 2019-11-24 ENCOUNTER — Encounter (HOSPITAL_COMMUNITY)
Admission: RE | Disposition: A | Discharge status: Home / Self Care | Payer: Self-pay | Source: Home / Self Care | Attending: Cardiovascular Disease

## 2019-11-24 DIAGNOSIS — I6521 Occlusion and stenosis of right carotid artery: Secondary | ICD-10-CM | POA: Diagnosis not present

## 2019-11-24 DIAGNOSIS — Z91030 Bee allergy status: Secondary | ICD-10-CM | POA: Insufficient documentation

## 2019-11-24 DIAGNOSIS — I493 Ventricular premature depolarization: Secondary | ICD-10-CM | POA: Diagnosis not present

## 2019-11-24 DIAGNOSIS — Z7984 Long term (current) use of oral hypoglycemic drugs: Secondary | ICD-10-CM | POA: Diagnosis not present

## 2019-11-24 DIAGNOSIS — I4819 Other persistent atrial fibrillation: Secondary | ICD-10-CM

## 2019-11-24 DIAGNOSIS — Z888 Allergy status to other drugs, medicaments and biological substances status: Secondary | ICD-10-CM | POA: Diagnosis not present

## 2019-11-24 DIAGNOSIS — E119 Type 2 diabetes mellitus without complications: Secondary | ICD-10-CM | POA: Insufficient documentation

## 2019-11-24 DIAGNOSIS — Z79899 Other long term (current) drug therapy: Secondary | ICD-10-CM | POA: Diagnosis not present

## 2019-11-24 DIAGNOSIS — Z95810 Presence of automatic (implantable) cardiac defibrillator: Secondary | ICD-10-CM | POA: Diagnosis not present

## 2019-11-24 DIAGNOSIS — E871 Hypo-osmolality and hyponatremia: Secondary | ICD-10-CM | POA: Diagnosis not present

## 2019-11-24 DIAGNOSIS — Z8673 Personal history of transient ischemic attack (TIA), and cerebral infarction without residual deficits: Secondary | ICD-10-CM | POA: Diagnosis not present

## 2019-11-24 DIAGNOSIS — Z7901 Long term (current) use of anticoagulants: Secondary | ICD-10-CM | POA: Diagnosis not present

## 2019-11-24 DIAGNOSIS — Z951 Presence of aortocoronary bypass graft: Secondary | ICD-10-CM | POA: Diagnosis not present

## 2019-11-24 DIAGNOSIS — Z87891 Personal history of nicotine dependence: Secondary | ICD-10-CM | POA: Diagnosis not present

## 2019-11-24 DIAGNOSIS — I5042 Chronic combined systolic (congestive) and diastolic (congestive) heart failure: Secondary | ICD-10-CM | POA: Diagnosis not present

## 2019-11-24 DIAGNOSIS — I714 Abdominal aortic aneurysm, without rupture: Secondary | ICD-10-CM | POA: Diagnosis not present

## 2019-11-24 DIAGNOSIS — I2510 Atherosclerotic heart disease of native coronary artery without angina pectoris: Secondary | ICD-10-CM | POA: Insufficient documentation

## 2019-11-24 DIAGNOSIS — Z91040 Latex allergy status: Secondary | ICD-10-CM | POA: Diagnosis not present

## 2019-11-24 DIAGNOSIS — I480 Paroxysmal atrial fibrillation: Secondary | ICD-10-CM | POA: Diagnosis not present

## 2019-11-24 HISTORY — PX: CARDIOVERSION: SHX1299

## 2019-11-24 LAB — POCT I-STAT, CHEM 8
BUN: 14 mg/dL (ref 8–23)
Calcium, Ion: 1.19 mmol/L (ref 1.15–1.40)
Chloride: 100 mmol/L (ref 98–111)
Creatinine, Ser: 0.9 mg/dL (ref 0.61–1.24)
Glucose, Bld: 139 mg/dL — ABNORMAL HIGH (ref 70–99)
HCT: 36 % — ABNORMAL LOW (ref 39.0–52.0)
Hemoglobin: 12.2 g/dL — ABNORMAL LOW (ref 13.0–17.0)
Potassium: 4.5 mmol/L (ref 3.5–5.1)
Sodium: 138 mmol/L (ref 135–145)
TCO2: 26 mmol/L (ref 22–32)

## 2019-11-24 SURGERY — CARDIOVERSION
Anesthesia: General

## 2019-11-24 MED ORDER — LIDOCAINE HCL (CARDIAC) PF 100 MG/5ML IV SOSY
PREFILLED_SYRINGE | INTRAVENOUS | Status: DC | PRN
  Administered 2019-11-24: 40 mg via INTRATRACHEAL

## 2019-11-24 MED ORDER — PROPOFOL 10 MG/ML IV BOLUS
INTRAVENOUS | Status: DC | PRN
  Administered 2019-11-24: 60 mg via INTRAVENOUS

## 2019-11-24 MED ORDER — SODIUM CHLORIDE 0.9 % IV SOLN: INTRAVENOUS | Status: DC | PRN

## 2019-11-24 NOTE — Transfer of Care (Signed)
Immediate Anesthesia Transfer of Care Note  Patient: [name]  Procedure(s) Performed: CARDIOVERSION (N/A )  Patient Location: Endoscopy Unit  Anesthesia Type:General  Level of Consciousness: awake, alert  and oriented  Airway & Oxygen Therapy: Patient Spontanous Breathing  Post-op Assessment: Report given to RN, Post -op Vital signs reviewed and stable and Patient moving all extremities  Post vital signs: Reviewed and stable  Last Vitals:  Vitals Value Taken Time  BP    Temp    Pulse    Resp    SpO2      Last Pain:  Vitals:   11/24/19 0953  PainSc: 0-No pain         Complications: No complications documented.

## 2019-11-24 NOTE — Anesthesia Procedure Notes (Signed)
Procedure Name: General with mask airway Date/Time: 11/24/2019 10:35 AM Performed by: Amadeo Garnet, CRNA Pre-anesthesia Checklist: Patient identified, Suction available, Emergency Drugs available and Patient being monitored Patient Re-evaluated:Patient Re-evaluated prior to induction Oxygen Delivery Method: Ambu bag Preoxygenation: Pre-oxygenation with 100% oxygen Induction Type: IV induction Placement Confirmation: positive ETCO2 Dental Injury: Teeth and Oropharynx as per pre-operative assessment

## 2019-11-24 NOTE — Anesthesia Preprocedure Evaluation (Addendum)
Anesthesia Evaluation  Patient identified by MRN, date of birth, ID band Patient awake    Reviewed: Allergy & Precautions, NPO status , Patient's Chart, lab work & pertinent test results, reviewed documented beta blocker date and time   Airway Mallampati: III  TM Distance: >3 FB Neck ROM: Full    Dental  (+) Teeth Intact, Dental Advisory Given   Pulmonary asthma , COPD, former smoker,    breath sounds clear to auscultation       Cardiovascular + CAD, + CABG and +CHF  + dysrhythmias Atrial Fibrillation + Cardiac Defibrillator  Rhythm:Irregular Rate:Abnormal     Neuro/Psych  Headaches, TIAnegative psych ROS   GI/Hepatic negative GI ROS, Neg liver ROS,   Endo/Other  diabetes, Type 2, Oral Hypoglycemic Agents  Renal/GU negative Renal ROS     Musculoskeletal negative musculoskeletal ROS (+)   Abdominal Normal abdominal exam  (+)   Peds  Hematology negative hematology ROS (+)   Anesthesia Other Findings   Reproductive/Obstetrics                            Anesthesia Physical Anesthesia Plan  ASA: III  Anesthesia Plan: General   Post-op Pain Management:    Induction: Intravenous  PONV Risk Score and Plan: 0 and Propofol infusion  Airway Management Planned: Simple Face Mask and Natural Airway  Additional Equipment: None  Intra-op Plan:   Post-operative Plan:   Informed Consent: I have reviewed the patients History and Physical, chart, labs and discussed the procedure including the risks, benefits and alternatives for the proposed anesthesia with the patient or authorized representative who has indicated his/her understanding and acceptance.       Plan Discussed with: CRNA  Anesthesia Plan Comments: (Echo: 1. Left ventricular ejection fraction, by visual estimation, is 35 to  40%. The left ventricle has moderately decreased function. There is global  left ventricular  hypokinesis and marked apical-basal and septal-lateral  dyssynchrony. There appears to be  apical akinesis.  2. Abnormal septal motion consistent with RV pacemaker.  3. Global right ventricle has moderately reduced systolic function.The  right ventricular size is normal. No increase in right ventricular wall  thickness.  4. Left atrial size was severely dilated.  5. Right atrial size was moderately dilated.  6. Mild mitral valve regurgitation. Mild-moderate mitral stenosis. Peak  and mean gradients are 16 and 5 mm Hg, respectively, at 70 bpm. Estimated  valve area is 1.4 cm sq by the PHT method.  7. Moderate calcification of the mitral valve leaflet(s).  8. Severe mitral annular calcification.  9. Severe thickening of the mitral valve leaflet(s).  10. The tricuspid valve is normal in structure. Tricuspid valve  regurgitation is mild-moderate.  11. The aortic valve is normal in structure. Aortic valve regurgitation  was not visualized by color flow Doppler.  12. The pulmonic valve was normal in structure. Pulmonic valve  regurgitation is mild by color flow Doppler.  13. Mildly elevated pulmonary artery systolic pressure.  14. A pacer wire is visualized.  15. The inferior vena cava is dilated in size with >50% respiratory  variability, suggesting right atrial pressure of 8 mmHg.  16. Left ventricular diastolic function could not be evaluated due to  atrial fibrillation and mitral stenosis.  19. Compared to February 2020, mitral valve gradients are worse, probably  due to new atrial fibrillation and increased ventricular rate. )       Anesthesia Quick Evaluation

## 2019-11-24 NOTE — Discharge Instructions (Signed)
Electrical Cardioversion   What can I expect after the procedure?  Your blood pressure, heart rate, breathing rate, and blood oxygen level will be monitored until you leave the hospital or clinic.  Your heart rhythm will be watched to make sure it does not change.  You may have some redness on the skin where the shocks were given. Follow these instructions at home:  Do not drive for 24 hours if you were given a sedative during your procedure.  Take over-the-counter and prescription medicines only as told by your health care provider.  Ask your health care provider how to check your pulse. Check it often.  Rest for 48 hours after the procedure or as told by your health care provider.  Avoid or limit your caffeine use as told by your health care provider.  Keep all follow-up visits as told by your health care provider. This is important. Contact a health care provider if:  You feel like your heart is beating too quickly or your pulse is not regular.  You have a serious muscle cramp that does not go away. Get help right away if:  You have discomfort in your chest.  You are dizzy or you feel faint.  You have trouble breathing or you are short of breath.  Your speech is slurred.  You have trouble moving an arm or leg on one side of your body.  Your fingers or toes turn cold or blue. Summary  Electrical cardioversion is the delivery of a jolt of electricity to restore a normal rhythm to the heart.  This procedure may be done right away in an emergency or may be a scheduled procedure if the condition is not an emergency.  Generally, this is a safe procedure.  After the procedure, check your pulse often as told by your health care provider. This information is not intended to replace advice given to you by your health care provider. Make sure you discuss any questions you have with your health care provider. Document Revised: 08/19/2018 Document Reviewed:  08/19/2018 Elsevier Patient Education  2020 Elsevier Inc.  

## 2019-11-24 NOTE — Interval H&P Note (Signed)
History and Physical Interval Note:  11/24/2019 9:39 AM  [name]  has presented today for surgery, with the diagnosis of AFIB.  The various methods of treatment have been discussed with the patient and family. After consideration of risks, benefits and other options for treatment, the patient has consented to  Procedure(s): CARDIOVERSION (N/A) as a surgical intervention.  The patient's history has been reviewed, patient examined, no change in status, stable for surgery.  I have reviewed the patient's chart and labs.  Questions were answered to the patient's satisfaction.     [name]

## 2019-11-24 NOTE — CV Procedure (Signed)
DCC: Anesthesia:  Hollis using Propofol  DCC x 1 with 150 J biphasic Converted to NSR Confirmed by Medtronic device AV pacing   No missed doses of anticoagulation No immediate neurologic sequelae  [name] MD [facility]

## 2019-11-24 NOTE — Anesthesia Postprocedure Evaluation (Signed)
Anesthesia Post Note  Patient: [name]  Procedure(s) Performed: CARDIOVERSION (N/A )     Patient location during evaluation: PACU Anesthesia Type: General Level of consciousness: awake and alert Pain management: pain level controlled Vital Signs Assessment: post-procedure vital signs reviewed and stable Respiratory status: spontaneous breathing, nonlabored ventilation, respiratory function stable and patient connected to nasal cannula oxygen Cardiovascular status: blood pressure returned to baseline and stable Postop Assessment: no apparent nausea or vomiting Anesthetic complications: no   No complications documented.  Last Vitals:  Vitals:   11/24/19 1056 11/24/19 1106  BP: 110/64 124/80  Pulse: 70 68  Resp: (!) 22 20  SpO2: 97% 99%    Last Pain:  Vitals:   11/24/19 1106  PainSc: 0-No pain                 [name]

## 2019-11-25 ENCOUNTER — Encounter (HOSPITAL_COMMUNITY): Payer: Self-pay | Admitting: Cardiovascular Disease

## 2019-12-01 ENCOUNTER — Encounter (HOSPITAL_COMMUNITY): Payer: Self-pay | Admitting: Nurse Practitioner

## 2019-12-01 ENCOUNTER — Ambulatory Visit (HOSPITAL_COMMUNITY)
Admission: RE | Admit: 2019-12-01 | Discharge: 2019-12-01 | Disposition: A | Discharge status: Home / Self Care | Payer: Medicare Other | Source: Ambulatory Visit | Attending: Nurse Practitioner | Admitting: Nurse Practitioner

## 2019-12-01 ENCOUNTER — Other Ambulatory Visit: Payer: Self-pay

## 2019-12-01 ENCOUNTER — Other Ambulatory Visit: Payer: Self-pay | Admitting: Internal Medicine

## 2019-12-01 VITALS — BP 122/72 | HR 76 | Ht 73.0 in | Wt 220.0 lb

## 2019-12-01 DIAGNOSIS — Z7901 Long term (current) use of anticoagulants: Secondary | ICD-10-CM | POA: Diagnosis not present

## 2019-12-01 DIAGNOSIS — Z87891 Personal history of nicotine dependence: Secondary | ICD-10-CM | POA: Diagnosis not present

## 2019-12-01 DIAGNOSIS — Z8249 Family history of ischemic heart disease and other diseases of the circulatory system: Secondary | ICD-10-CM | POA: Insufficient documentation

## 2019-12-01 DIAGNOSIS — Z95810 Presence of automatic (implantable) cardiac defibrillator: Secondary | ICD-10-CM | POA: Diagnosis not present

## 2019-12-01 DIAGNOSIS — Z888 Allergy status to other drugs, medicaments and biological substances status: Secondary | ICD-10-CM | POA: Diagnosis not present

## 2019-12-01 DIAGNOSIS — D6869 Other thrombophilia: Secondary | ICD-10-CM

## 2019-12-01 DIAGNOSIS — Z951 Presence of aortocoronary bypass graft: Secondary | ICD-10-CM | POA: Insufficient documentation

## 2019-12-01 DIAGNOSIS — I4891 Unspecified atrial fibrillation: Secondary | ICD-10-CM | POA: Diagnosis not present

## 2019-12-01 DIAGNOSIS — I5042 Chronic combined systolic (congestive) and diastolic (congestive) heart failure: Secondary | ICD-10-CM | POA: Diagnosis not present

## 2019-12-01 DIAGNOSIS — Z79899 Other long term (current) drug therapy: Secondary | ICD-10-CM | POA: Insufficient documentation

## 2019-12-01 DIAGNOSIS — Z91040 Latex allergy status: Secondary | ICD-10-CM | POA: Insufficient documentation

## 2019-12-01 DIAGNOSIS — I4819 Other persistent atrial fibrillation: Secondary | ICD-10-CM | POA: Diagnosis not present

## 2019-12-01 LAB — POCT I-STAT, CHEM 8
BUN: 20 mg/dL (ref 8–23)
Calcium, Ion: 1.15 mmol/L (ref 1.15–1.40)
Chloride: 101 mmol/L (ref 98–111)
Creatinine, Ser: 1 mg/dL (ref 0.61–1.24)
Glucose, Bld: 138 mg/dL — ABNORMAL HIGH (ref 70–99)
HCT: 40 % (ref 39.0–52.0)
Hemoglobin: 13.6 g/dL (ref 13.0–17.0)
Potassium: 6.7 mmol/L (ref 3.5–5.1)
Sodium: 136 mmol/L (ref 135–145)
TCO2: 28 mmol/L (ref 22–32)

## 2019-12-01 NOTE — Progress Notes (Signed)
Primary Care Physician: Sueanne Margarita, DO Referring Physician: Dr. [facility]   [name] is a 86 y.o. male with a h/o paroxysmal afib, CAD, TIA, CHF, ICD with polymorphic v tach as well as persistent afib. He is in the clinic for persistent afib since 10/3 per the device clinic. He feels more winded in afib. He has not missed any  eliquis for at least 3 weeks and has had both vaccines and his booster. Ekg shows v paced rhythm today. He states no known trigger.   F/u in afib clinic, 12/01/19. He had successful cardioversion and feels well. He continues in SR as EKG shows AV paced. He can't really tell much difference in afib vrs SR.   Today, he denies symptoms of palpitations, chest pain, shortness of breath, orthopnea, PND, lower extremity edema, dizziness, presyncope, syncope, or neurologic sequela. The patient is tolerating medications without difficulties and is otherwise without complaint today.   Past Medical History:  Diagnosis Date  . Acute on chronic combined systolic (congestive) and diastolic (congestive) heart failure ([place])   . AICD (automatic cardioverter/defibrillator) present    [employer]n PPM/ICD  . Carotid stenosis, right    s/p endarterectomy 11/07/18  . Chronic combined systolic (congestive) and diastolic (congestive) heart failure ([place])   . Coronary artery disease    [facility]  [facility]  . Diabetes mellitus   . Dysrhythmia   . Endocarditis, valve unspecified    Mitral  . Headache    hx migraines 6-7 x mo  . History of migraine headaches   . History of seasonal allergies   . ICD (implantable cardiac defibrillator) in place   . PAF (paroxysmal atrial fibrillation) ([place])    NO LAA occlusion at the time of surgery  [name] 2018  . Pleural effusion   . PVC's (premature ventricular contractions)   . Ventricular tachycardia, polymorphic ([place])    status post ICD implantation   Past Surgical History:  Procedure Laterality Date  .  CARDIAC CATHETERIZATION  04/03/2007   EF 40%  . CARDIAC CATHETERIZATION  02/06/2006   EF 45%. ANTERIOR HYPOKINESIS  . CARDIAC CATHETERIZATION  05/29/2000   EF 50%. MILD ANTERIOR HYPOKINESIS  . CARDIAC CATHETERIZATION  10/25/1999   EF 50%. SEVERE MITRAL REGURGITATION  . CARDIAC CATHETERIZATION  01/06/2003   EF 50%  . CARDIAC DEFIBRILLATOR PLACEMENT    . CARDIOVERSION N/A 02/07/2019   Procedure: CARDIOVERSION;  Surgeon: Fay Records, MD;  Location: [facility] ENDOSCOPY;  Service: Cardiovascular;  Laterality: N/A;  . CARDIOVERSION N/A 11/24/2019   Procedure: CARDIOVERSION;  Surgeon: Josue Hector, MD;  Location: [facility] ENDOSCOPY;  Service: Cardiovascular;  Laterality: N/A;  . CATARACT EXTRACTION W/ INTRAOCULAR LENS  IMPLANT, BILATERAL    . CHOLECYSTECTOMY N/A 01/08/2019   Procedure: LAPAROSCOPIC CHOLECYSTECTOMY WITH INTRAOPERATIVE CHOLANGIOGRAM;  Surgeon: Donnie Mesa, MD;  Location: [place];  Service: General;  Laterality: N/A;  . CORONARY ARTERY BYPASS GRAFT  2001   2 VESSEL CABG AND MITRAL VALVE REPAIR. HE HAD A LIMA GRAFT TO THE LAD AND RADIAL ARTERY  GRAFT TO THE OBTUSE MARGINAL  OF LEFT CIRCUMFLEX  . ENDARTERECTOMY Right 11/07/2018   Procedure: ENDARTERECTOMY CAROTID RIGHT;  Surgeon: Waynetta Sandy, MD;  Location: [place];  Service: Vascular;  Laterality: Right;  . EP IMPLANTABLE DEVICE N/A 11/06/2014   Procedure: ICD Generator Changeout;  Surgeon: Deboraha Sprang, MD;  Location: [place] CV LAB;  Service: Cardiovascular;  Laterality: N/A;  . ERCP N/A 01/10/2019   Procedure:  ENDOSCOPIC RETROGRADE CHOLANGIOPANCREATOGRAPHY (ERCP);  Surgeon: Clarene Essex, MD;  Location: [place];  Service: Endoscopy;  Laterality: N/A;  . FOREIGN BODY REMOVAL Right 06/21/2017   Procedure: FOREIGN BODY REMOVAL ADULT RIGHT RING FINGER;  Surgeon: Leanora Cover, MD;  Location: [place];  Service: Orthopedics;  Laterality: Right;  . HEMORRHOID SURGERY    . HEMORROIDECTOMY    . INTRAVASCULAR PRESSURE WIRE/FFR STUDY N/A  01/05/2017   Procedure: INTRAVASCULAR PRESSURE WIRE/FFR STUDY;  Surgeon: Sherren Mocha, MD;  Location: [place] CV LAB;  Service: Cardiovascular;  Laterality: N/A;  . KNEE ARTHROSCOPY     RIGHT KNEE  . LAPAROSCOPIC APPENDECTOMY N/A 01/08/2019   Procedure: APPENDECTOMY LAPAROSCOPIC;  Surgeon: Donnie Mesa, MD;  Location: [place];  Service: General;  Laterality: N/A;  . LEFT HEART CATHETERIZATION WITH CORONARY ANGIOGRAM N/A 07/04/2012   Procedure: LEFT HEART CATHETERIZATION WITH CORONARY ANGIOGRAM;  Surgeon: Sherren Mocha, MD;  Location: [facility] CATH LAB;  Service: Cardiovascular;  Laterality: N/A;  . MITRAL VALVE REPLACEMENT     No LAA occlusion at the time of surgery  Karie Soda report 2018  . REMOVAL OF STONES  01/10/2019   Procedure: REMOVAL OF STONES;  Surgeon: Clarene Essex, MD;  Location: [place];  Service: Endoscopy;;  . RIGHT/LEFT HEART CATH AND CORONARY/GRAFT ANGIOGRAPHY N/A 01/05/2017   Procedure: RIGHT/LEFT HEART CATH AND CORONARY/GRAFT ANGIOGRAPHY;  Surgeon: Sherren Mocha, MD;  Location: [place] CV LAB;  Service: Cardiovascular;  Laterality: N/A;  . SPHINCTEROTOMY  01/10/2019   Procedure: SPHINCTEROTOMY;  Surgeon: Clarene Essex, MD;  Location: [place];  Service: Endoscopy;;  . TRANSTHORACIC ECHOCARDIOGRAM  04/02/2007   EF 35%    Current Outpatient Medications  Medication Sig Dispense Refill  . acetaminophen (TYLENOL) 500 MG tablet Take 2 tablets (1,000 mg total) by mouth every 8 (eight) hours as needed for mild pain, moderate pain or headache. 30 tablet 0  . atorvastatin (LIPITOR) 80 MG tablet Take 1 tablet (80 mg total) by mouth at bedtime. Hold until liver function has normalized.    . cetirizine (ZYRTEC) 10 MG tablet Take 10 mg by mouth daily.     . Cholecalciferol (VITAMIN D) 125 MCG (5000 UT) CAPS Take 5,000-10,000 Units by mouth See admin instructions. Take 1 capsule (5000 units) by mouth on Tuesdays, Thursdays, Saturdays, & Sundays. Take 2 capsules (10000 units) by  mouth on Mondays, Wednesdays, & Fridays    . dofetilide ([name]) 500 MCG capsule Take 1 capsule (500 mcg total) by mouth 2 (two) times daily. 180 capsule 3  . ELIQUIS 5 MG TABS tablet TAKE ONE TABLET BY MOUTH TWICE DAILY 180 tablet 1  . EPIPEN 2-PAK 0.3 MG/0.3ML SOAJ injection Inject 0.3 mg into the muscle daily as needed for anaphylaxis.     Eduard Roux (AIMOVIG) 140 MG/ML SOAJ Inject 140 mg into the skin every 28 (twenty-eight) days. 1.12 mL 11  . fluticasone (FLONASE) 50 MCG/ACT nasal spray Place 1 spray into the nose daily as needed for rhinitis or allergies.    . furosemide (LASIX) 20 MG tablet Take 1 tablet (20 mg total) by mouth daily as needed for fluid. 90 tablet 3  . Magnesium 250 MG TABS Take 250 mg by mouth daily.     . metoprolol succinate (TOPROL-XL) 50 MG 24 hr tablet Take 50 mg by mouth daily. Take with or immediately following a meal.    . Multiple Vitamin (MULTIVITAMIN WITH MINERALS) TABS tablet Take 1 tablet by mouth every evening. Centrum Silver    . Multiple Vitamins-Minerals (  PRESERVISION AREDS 2 PO) Take 1 tablet by mouth in the morning and at bedtime.    . nateglinide (STARLIX) 120 MG tablet Take 120 mg by mouth 2 (two) times daily before a meal.     . pioglitazone (ACTOS) 45 MG tablet Take 45 mg by mouth daily.      . sitaGLIPtin (JANUVIA) 100 MG tablet Take 100 mg by mouth at bedtime.     . Tiotropium Bromide-Olodaterol (STIOLTO RESPIMAT) 2.5-2.5 MCG/ACT AERS Inhale 2 puffs into the lungs daily. 4 g 12   No current facility-administered medications for this encounter.    Allergies  Allergen Reactions  . Bee Venom Anaphylaxis  . Latex Other (See Comments)    REDNESS AT REACTION SITE.  Marland Kitchen Metformin Diarrhea  . Rivaroxaban Other (See Comments)    Headaches, blurred vision  . Sotalol Other (See Comments)    "BLUE TOES"- cut his circulation off  . Warfarin Sodium Other (See Comments)    REACTION: migraine headaches/vision impariment    Social History    Socioeconomic History  . Marital status: Married    Spouse name: Mardene Celeste  . Number of children: 2  . Years of education: Not on file  . Highest education level: Associate degree: academic program  Occupational History  . Occupation: retired  Tobacco Use  . Smoking status: Former Smoker    Packs/day: 2.00    Years: 16.00    Pack years: 32.00    Types: Cigarettes    Start date: 02/23/1956    Quit date: 01/31/1971    Years since quitting: 48.8  . Smokeless tobacco: Never Used  Vaping Use  . Vaping Use: Never used  Substance and Sexual Activity  . Alcohol use: Yes    Alcohol/week: 0.0 standard drinks    Comment: 1-2 a week  . Drug use: No  . Sexual activity: Yes  Other Topics Concern  . Not on file  Social History Narrative   Patient is right-handed. He lives with his wife ina 2 story home. He drinks one cup of coffee and a diet coke a day.    Social Determinants of Health   Financial Resource Strain:   . Difficulty of Paying Living Expenses: Not on file  Food Insecurity:   . Worried About Charity fundraiser in the Last Year: Not on file  . Ran Out of Food in the Last Year: Not on file  Transportation Needs:   . Lack of Transportation (Medical): Not on file  . Lack of Transportation (Non-Medical): Not on file  Physical Activity:   . Days of Exercise per Week: Not on file  . Minutes of Exercise per Session: Not on file  Stress:   . Feeling of Stress : Not on file  Social Connections:   . Frequency of Communication with Friends and Family: Not on file  . Frequency of Social Gatherings with Friends and Family: Not on file  . Attends Religious Services: Not on file  . Active Member of Clubs or Organizations: Not on file  . Attends Archivist Meetings: Not on file  . Marital Status: Not on file  Intimate Partner Violence:   . Fear of Current or Ex-Partner: Not on file  . Emotionally Abused: Not on file  . Physically Abused: Not on file  . Sexually Abused:  Not on file    Family History  Problem Relation Age of Onset  . Heart disease Father   . Emphysema Father   . Stroke Mother   .  Cancer Sister        breast cancer  . Heart disease Paternal Grandmother   . Heart disease Paternal Grandfather   . Diabetes Other        grandmother, aunt, uncle  . Cancer Other        lung cancer, aunt    ROS- All systems are reviewed and negative except as per the HPI above  Physical Exam: Vitals:   12/01/19 1429  BP: 122/72  Pulse: 76  Weight: 99.8 kg  Height: 6\' 1"  (1.854 m)   Wt Readings from Last 3 Encounters:  12/01/19 99.8 kg  11/24/19 96.2 kg  11/17/19 96.2 kg    Labs: Lab Results  Component Value Date   NA 138 11/24/2019   K 4.5 11/24/2019   CL 100 11/24/2019   CO2 27 11/11/2019   GLUCOSE 139 (H) 11/24/2019   BUN 14 11/24/2019   CREATININE 0.90 11/24/2019   CALCIUM 9.2 11/11/2019   PHOS 3.7 11/14/2018   MG 2.0 09/24/2019   Lab Results  Component Value Date   INR 1.3 (H) 10/03/2019   Lab Results  Component Value Date   CHOL 110 11/05/2018   HDL 48 11/05/2018   LDLCALC 44 11/05/2018   TRIG 90 11/05/2018     GEN- The patient is well appearing, alert and oriented x 3 today.   Head- normocephalic, atraumatic Eyes-  Sclera clear, conjunctiva pink Ears- hearing intact Oropharynx- clear Neck- supple, no JVP Lymph- no cervical lymphadenopathy Lungs- Clear to ausculation bilaterally, normal work of breathing Heart- Regular rate and rhythm, no murmurs, rubs or gallops, PMI not laterally displaced GI- soft, NT, ND, + BS Extremities- no clubbing, cyanosis, or edema MS- no significant deformity or atrophy Skin- no rash or lesion Psych- euthymic mood, full affect Neuro- strength and sensation are intact  EKG- AV paced at 76 bpm, pr int 176 ms, qrs int 198 ms, qtc 573 bpm   Assessment and Plan: 1. Persistent  afib  Persistent since 10/3 per device clinic Will schedule cardioversion  Continue dofetilide 500 mcg bid   Continue metoprolol succinate 50 mg daily   Has completed 3 covid vaccines  Bmet/mag today  Covid test scheduled   2. CHA2DS2VASc score of at least 7  Continue eliquis 5 mg bid  States no missed doses in the last 3 weeks    F/u with Dr. Johnsie Cancel  and device clinic as scheduled.   Geroge Baseman Eugen Jeansonne, [facility] [address] [place], [place] [address] [phone]

## 2019-12-03 MED ORDER — DOFETILIDE 500 MCG PO CAPS
500.0000 ug | ORAL_CAPSULE | Freq: Two times a day (BID) | ORAL | 2 refills | Status: DC
Start: 2019-12-03 — End: 2021-05-12

## 2019-12-08 ENCOUNTER — Other Ambulatory Visit: Payer: Self-pay | Admitting: Internal Medicine

## 2019-12-08 NOTE — Telephone Encounter (Signed)
Pt last saw Roderic Palau, NP on 12/01/19, last labs 11/24/19 Creat 0.90, age 86, weight 99.8kg, based on specified criteria pt is on appropriate dosage of Eliquis 5mg  BID.  Will refill rx.

## 2019-12-23 DIAGNOSIS — R040 Epistaxis: Secondary | ICD-10-CM | POA: Diagnosis not present

## 2019-12-30 ENCOUNTER — Telehealth: Payer: Self-pay

## 2019-12-30 NOTE — Telephone Encounter (Signed)
Pt set letter stating he needs a new patient assistance program for [employer] filled out and faxed.   Form filled out and signed and fax.

## 2020-01-05 ENCOUNTER — Other Ambulatory Visit: Payer: Self-pay | Admitting: Internal Medicine

## 2020-01-05 DIAGNOSIS — I4891 Unspecified atrial fibrillation: Secondary | ICD-10-CM

## 2020-01-29 DIAGNOSIS — E11621 Type 2 diabetes mellitus with foot ulcer: Secondary | ICD-10-CM | POA: Diagnosis not present

## 2020-01-29 DIAGNOSIS — E1169 Type 2 diabetes mellitus with other specified complication: Secondary | ICD-10-CM | POA: Diagnosis not present

## 2020-01-29 DIAGNOSIS — L97511 Non-pressure chronic ulcer of other part of right foot limited to breakdown of skin: Secondary | ICD-10-CM | POA: Diagnosis not present

## 2020-02-06 ENCOUNTER — Ambulatory Visit (INDEPENDENT_AMBULATORY_CARE_PROVIDER_SITE_OTHER): Payer: Medicare Other

## 2020-02-06 DIAGNOSIS — I472 Ventricular tachycardia, unspecified: Secondary | ICD-10-CM

## 2020-02-07 LAB — CUP PACEART REMOTE DEVICE CHECK
Battery Remaining Longevity: 20 mo
Battery Voltage: 2.92 V
Brady Statistic AP VP Percent: 61.52 %
Brady Statistic AP VS Percent: 1.21 %
Brady Statistic AS VP Percent: 32.31 %
Brady Statistic AS VS Percent: 4.95 %
Brady Statistic RA Percent Paced: 60.67 %
Brady Statistic RV Percent Paced: 92.92 %
Date Time Interrogation Session: 20220107033322
HighPow Impedance: 40 Ohm
HighPow Impedance: 51 Ohm
Implantable Lead Implant Date: 20040309
Implantable Lead Implant Date: 20040309
Implantable Lead Location: 753859
Implantable Lead Location: 753860
Implantable Lead Model: 158
Implantable Lead Model: 5076
Implantable Lead Serial Number: 115061
Implantable Pulse Generator Implant Date: 20161007
Lead Channel Impedance Value: 361 Ohm
Lead Channel Impedance Value: 4047 Ohm
Lead Channel Impedance Value: 4047 Ohm
Lead Channel Impedance Value: 4047 Ohm
Lead Channel Impedance Value: 4047 Ohm
Lead Channel Impedance Value: 4047 Ohm
Lead Channel Impedance Value: 4047 Ohm
Lead Channel Impedance Value: 4047 Ohm
Lead Channel Impedance Value: 4047 Ohm
Lead Channel Impedance Value: 4047 Ohm
Lead Channel Impedance Value: 4047 Ohm
Lead Channel Impedance Value: 475 Ohm
Lead Channel Impedance Value: 513 Ohm
Lead Channel Pacing Threshold Amplitude: 0.5 V
Lead Channel Pacing Threshold Amplitude: 0.625 V
Lead Channel Pacing Threshold Pulse Width: 0.4 ms
Lead Channel Pacing Threshold Pulse Width: 0.4 ms
Lead Channel Sensing Intrinsic Amplitude: 1 mV
Lead Channel Sensing Intrinsic Amplitude: 1 mV
Lead Channel Sensing Intrinsic Amplitude: 7.375 mV
Lead Channel Sensing Intrinsic Amplitude: 7.375 mV
Lead Channel Setting Pacing Amplitude: 2 V
Lead Channel Setting Pacing Amplitude: 2.5 V
Lead Channel Setting Pacing Pulse Width: 0.4 ms
Lead Channel Setting Sensing Sensitivity: 0.3 mV

## 2020-02-12 ENCOUNTER — Telehealth: Payer: Self-pay

## 2020-02-12 NOTE — Telephone Encounter (Signed)
Telephone call to pt, LVM,  Fax received from [employer], Pt needs to fill out a new application for [employer] assistance.

## 2020-02-16 ENCOUNTER — Encounter (INDEPENDENT_AMBULATORY_CARE_PROVIDER_SITE_OTHER): Payer: Medicare Other | Admitting: Ophthalmology

## 2020-02-18 ENCOUNTER — Encounter (INDEPENDENT_AMBULATORY_CARE_PROVIDER_SITE_OTHER): Payer: Medicare Other | Admitting: Ophthalmology

## 2020-02-20 NOTE — Progress Notes (Signed)
Remote ICD transmission.   

## 2020-02-24 NOTE — Progress Notes (Signed)
[place] form filled out and faxed to company per pt request.

## 2020-03-02 ENCOUNTER — Ambulatory Visit (INDEPENDENT_AMBULATORY_CARE_PROVIDER_SITE_OTHER): Payer: Medicare Other | Admitting: Ophthalmology

## 2020-03-02 ENCOUNTER — Other Ambulatory Visit: Payer: Self-pay

## 2020-03-02 ENCOUNTER — Encounter (INDEPENDENT_AMBULATORY_CARE_PROVIDER_SITE_OTHER): Payer: Self-pay | Admitting: Ophthalmology

## 2020-03-02 DIAGNOSIS — E113393 Type 2 diabetes mellitus with moderate nonproliferative diabetic retinopathy without macular edema, bilateral: Secondary | ICD-10-CM

## 2020-03-02 DIAGNOSIS — H353131 Nonexudative age-related macular degeneration, bilateral, early dry stage: Secondary | ICD-10-CM

## 2020-03-02 DIAGNOSIS — H47021 Hemorrhage in optic nerve sheath, right eye: Secondary | ICD-10-CM | POA: Diagnosis not present

## 2020-03-02 DIAGNOSIS — H43813 Vitreous degeneration, bilateral: Secondary | ICD-10-CM | POA: Diagnosis not present

## 2020-03-02 NOTE — Assessment & Plan Note (Signed)
Small tiny hard drusen technically present on careful dilated examination OU yet this does not rise to the level of intermediate age-related macular degeneration thus does not qualify for use of AREDS 2 as defined by previous prospective controlled studies.  I suggest the patient to take either none or only 1 daily

## 2020-03-02 NOTE — Assessment & Plan Note (Signed)
The nature of moderate nonproliferative diabetic retinopathy was discussed with the patient as well as the need for more frequent follow up to judge for progression. Good blood glucose, blood pressure, and serum lipid control was recommended as well as avoidance of smoking and maintenance of normal weight.  Close follow up with PCP encouraged. °

## 2020-03-02 NOTE — Progress Notes (Signed)
03/02/2020     CHIEF COMPLAINT Patient presents for Retina Follow Up (6 Month Diabetic F/U OU//Pt denies noticeable changes to [place] OU since last visit. Pt denies ocular pain, flashes of light, or floaters OU. //A1c: 6.4, 6 months ago/LBS: 108 yesterday)   HISTORY OF PRESENT ILLNESS: [name] is a 87 y.o. male who presents to the clinic today for:   HPI    Retina Follow Up    Patient presents with  Diabetic Retinopathy.  In both eyes.  This started 6 months ago.  Severity is mild.  Duration of 6 months.  Since onset it is stable. Additional comments: 6 Month Diabetic F/U OU  Pt denies noticeable changes to [place] OU since last visit. Pt denies ocular pain, flashes of light, or floaters OU.   A1c: 6.4, 6 months ago LBS: 108 yesterday       Last edited by Rockie Neighbours, McDonald on 03/02/2020  2:35 PM. (History)      Referring physician: Sueanne Margarita, DO [address] [place],  [place] [address]  HISTORICAL INFORMATION:   Selected notes from the MEDICAL RECORD NUMBER    Lab Results  Component Value Date   HGBA1C 6.8 (H) 11/04/2018     CURRENT MEDICATIONS: No current outpatient medications on file. (Ophthalmic Drugs)   No current facility-administered medications for this visit. (Ophthalmic Drugs)   Current Outpatient Medications (Other)  Medication Sig  . acetaminophen (TYLENOL) 500 MG tablet Take 2 tablets (1,000 mg total) by mouth every 8 (eight) hours as needed for mild pain, moderate pain or headache.  Marland Kitchen atorvastatin (LIPITOR) 80 MG tablet Take 1 tablet (80 mg total) by mouth at bedtime. Hold until liver function has normalized.  . cetirizine (ZYRTEC) 10 MG tablet Take 10 mg by mouth daily.   . Cholecalciferol (VITAMIN D) 125 MCG (5000 UT) CAPS Take 5,000-10,000 Units by mouth See admin instructions. Take 1 capsule (5000 units) by mouth on Tuesdays, Thursdays, Saturdays, & Sundays. Take 2 capsules (10000 units) by mouth on Mondays, Wednesdays, & Fridays  . dofetilide  ([name]) 500 MCG capsule Take 1 capsule (500 mcg total) by mouth 2 (two) times daily.  Marland Kitchen ELIQUIS 5 MG TABS tablet TAKE ONE TABLET BY MOUTH TWICE DAILY  . EPIPEN 2-PAK 0.3 MG/0.3ML SOAJ injection Inject 0.3 mg into the muscle daily as needed for anaphylaxis.   Eduard Roux (AIMOVIG) 140 MG/ML SOAJ Inject 140 mg into the skin every 28 (twenty-eight) days.  . fluticasone (FLONASE) 50 MCG/ACT nasal spray Place 1 spray into the nose daily as needed for rhinitis or allergies.  . furosemide (LASIX) 20 MG tablet Take 1 tablet (20 mg total) by mouth daily as needed for fluid.  . Magnesium 250 MG TABS Take 250 mg by mouth daily.   . metoprolol succinate (TOPROL-XL) 50 MG 24 hr tablet TAKE ONE TABLET BY MOUTH TWICE DAILY WITH A MEAL  . Multiple Vitamin (MULTIVITAMIN WITH MINERALS) TABS tablet Take 1 tablet by mouth every evening. Centrum Silver  . Multiple Vitamins-Minerals (PRESERVISION AREDS 2 PO) Take 1 tablet by mouth in the morning and at bedtime.  . nateglinide (STARLIX) 120 MG tablet Take 120 mg by mouth 2 (two) times daily before a meal.   . pioglitazone (ACTOS) 45 MG tablet Take 45 mg by mouth daily.    . sitaGLIPtin (JANUVIA) 100 MG tablet Take 100 mg by mouth at bedtime.   . Tiotropium Bromide-Olodaterol (STIOLTO RESPIMAT) 2.5-2.5 MCG/ACT AERS Inhale 2 puffs into the lungs daily.  No current facility-administered medications for this visit. (Other)      REVIEW OF SYSTEMS:    ALLERGIES Allergies  Allergen Reactions  . Bee Venom Anaphylaxis  . Latex Other (See Comments)    REDNESS AT REACTION SITE.  Marland Kitchen Metformin Diarrhea  . Rivaroxaban Other (See Comments)    Headaches, blurred vision  . Sotalol Other (See Comments)    "BLUE TOES"- cut his circulation off  . Warfarin Sodium Other (See Comments)    REACTION: migraine headaches/vision impariment    PAST MEDICAL HISTORY Past Medical History:  Diagnosis Date  . Acute on chronic combined systolic (congestive) and diastolic  (congestive) heart failure ([place])   . AICD (automatic cardioverter/defibrillator) present    Medtr[employer]n PPM/ICD  . Carotid stenosis, right    s/p endarterectomy 11/07/18  . Chronic combined systolic (congestive) and diastolic (congestive) heart failure ([place])   . Coronary artery disease    [facility]  [facility]  . Diabetes mellitus   . Dysrhythmia   . Endocarditis, valve unspecified    Mitral  . Headache    hx migraines 6-7 x mo  . History of migraine headaches   . History of seasonal allergies   . ICD (implantable cardiac defibrillator) in place   . 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  We will monitor this closely and look for changes over the ensuing 6 months.  Repeat visit here in 6 months  . PAF (paroxysmal atrial fibrillation) ([place])    NO LAA occlusion at the time of surgery  [name] 2018  . Pleural effusion   . PVC's (premature ventricular contractions)   . Ventricular tachycardia, polymorphic ([place])    status post ICD implantation   Past Surgical History:  Procedure Laterality Date  . CARDIAC CATHETERIZATION  04/03/2007   EF 40%  . CARDIAC CATHETERIZATION  02/06/2006   EF 45%. ANTERIOR HYPOKINESIS  . CARDIAC CATHETERIZATION  05/29/2000   EF 50%. MILD ANTERIOR HYPOKINESIS  . CARDIAC CATHETERIZATION  10/25/1999   EF 50%. SEVERE MITRAL REGURGITATION  . CARDIAC CATHETERIZATION  01/06/2003   EF 50%  . CARDIAC DEFIBRILLATOR PLACEMENT    . CARDIOVERSION N/A 02/07/2019   Procedure: CARDIOVERSION;  Surgeon: Fay Records, MD;  Location: [facility] ENDOSCOPY;  Service: Cardiovascular;  Laterality: N/A;  . CARDIOVERSION N/A 11/24/2019   Procedure: CARDIOVERSION;  Surgeon: Josue Hector, MD;  Location: [facility] ENDOSCOPY;  Service: Cardiovascular;  Laterality: N/A;  . CATARACT EXTRACTION W/ INTRAOCULAR LENS  IMPLANT, BILATERAL    . CHOLECYSTECTOMY N/A 01/08/2019   Procedure:  LAPAROSCOPIC CHOLECYSTECTOMY WITH INTRAOPERATIVE CHOLANGIOGRAM;  Surgeon: Donnie Mesa, MD;  Location: [place];  Service: General;  Laterality: N/A;  . CORONARY ARTERY BYPASS GRAFT  2001   2 VESSEL CABG AND MITRAL VALVE REPAIR. HE HAD A LIMA GRAFT TO THE LAD AND RADIAL ARTERY  GRAFT TO THE OBTUSE MARGINAL  OF LEFT CIRCUMFLEX  . ENDARTERECTOMY Right 11/07/2018   Procedure: ENDARTERECTOMY CAROTID RIGHT;  Surgeon: Waynetta Sandy, MD;  Location: [place];  Service: Vascular;  Laterality: Right;  . EP IMPLANTABLE DEVICE N/A 11/06/2014   Procedure: ICD Generator Changeout;  Surgeon: Deboraha Sprang, MD;  Location: [place] CV LAB;  Service: Cardiovascular;  Laterality: N/A;  . ERCP N/A 01/10/2019   Procedure: ENDOSCOPIC RETROGRADE CHOLANGIOPANCREATOGRAPHY (ERCP);  Surgeon: Clarene Essex, MD;  Location: [place];  Service: Endoscopy;  Laterality: N/A;  .  FOREIGN BODY REMOVAL Right 06/21/2017   Procedure: FOREIGN BODY REMOVAL ADULT RIGHT RING FINGER;  Surgeon: Leanora Cover, MD;  Location: [place];  Service: Orthopedics;  Laterality: Right;  . HEMORRHOID SURGERY    . HEMORROIDECTOMY    . INTRAVASCULAR PRESSURE WIRE/FFR STUDY N/A 01/05/2017   Procedure: INTRAVASCULAR PRESSURE WIRE/FFR STUDY;  Surgeon: Sherren Mocha, MD;  Location: [place] CV LAB;  Service: Cardiovascular;  Laterality: N/A;  . KNEE ARTHROSCOPY     RIGHT KNEE  . LAPAROSCOPIC APPENDECTOMY N/A 01/08/2019   Procedure: APPENDECTOMY LAPAROSCOPIC;  Surgeon: Donnie Mesa, MD;  Location: [place];  Service: General;  Laterality: N/A;  . LEFT HEART CATHETERIZATION WITH CORONARY ANGIOGRAM N/A 07/04/2012   Procedure: LEFT HEART CATHETERIZATION WITH CORONARY ANGIOGRAM;  Surgeon: Sherren Mocha, MD;  Location[facility] CATH LAB;  Service: Cardiovascular;  Laterality: N/A;  . MITRAL VALVE REPLACEMENT     No LAA occlusion at the time of surgery  Karie Soda report 2018  . REMOVAL OF STONES  01/10/2019   Procedure: REMOVAL OF STONES;  Surgeon: Clarene Essex, MD;  Location: [place];  Service: Endoscopy;;  . RIGHT/LEFT HEART CATH AND CORONARY/GRAFT ANGIOGRAPHY N/A 01/05/2017   Procedure: RIGHT/LEFT HEART CATH AND CORONARY/GRAFT ANGIOGRAPHY;  Surgeon: Sherren Mocha, MD;  Location: [place] CV LAB;  Service: Cardiovascular;  Laterality: N/A;  . SPHINCTEROTOMY  01/10/2019   Procedure: SPHINCTEROTOMY;  Surgeon: Clarene Essex, MD;  Location[facility] ENDOSCOPY;  Service: Endoscopy;;  . TRANSTHORACIC ECHOCARDIOGRAM  04/02/2007   EF 35%    FAMILY HISTORY Family History  Problem Relation Age of Onset  . Heart disease Father   . Emphysema Father   . Stroke Mother   . Cancer Sister        breast cancer  . Heart disease Paternal Grandmother   . Heart disease Paternal Grandfather   . Diabetes Other        grandmother, aunt, uncle  . Cancer Other        lung cancer, aunt    SOCIAL HISTORY Social History   Tobacco Use  . Smoking status: Former Smoker    Packs/day: 2.00    Years: 16.00    Pack years: 32.00    Types: Cigarettes    Start date: 02/23/1956    Quit date: 01/31/1971    Years since quitting: 49.1  . Smokeless tobacco: Never Used  Vaping Use  . Vaping Use: Never used  Substance Use Topics  . Alcohol use: Yes    Alcohol/week: 0.0 standard drinks    Comment: 1-2 a week  . Drug use: No         OPHTHALMIC EXAM: Base Eye Exam    Visual Acuity (ETDRS)      Right Left   Dist cc 20/20 20/30 -2   Dist ph cc  20/25 +2   Correction: Glasses       Tonometry (Tonopen, 2:36 PM)      Right Left   Pressure 10 13       Pupils      Pupils Dark Light Shape React APD   Right PERRL 5 4 Round Slow None   Left PERRL 5 4 Round Slow None       Visual Fields (Counting fingers)      Left Right    Full Full       Extraocular Movement      Right Left    Full Full       Neuro/Psych    Oriented x3:  Yes   Mood/Affect: Normal       Dilation    Both eyes: 1.0% Mydriacyl, 2.5% Phenylephrine @ 2:39 PM        Slit Lamp and  Fundus Exam    External Exam      Right Left   External Normal Normal       Slit Lamp Exam      Right Left   Lids/Lashes Normal Normal   Conjunctiva/Sclera White and quiet White and quiet   Cornea Clear Clear   Anterior Chamber Deep and quiet Deep and quiet   Iris Round and reactive Round and reactive   Lens Posterior chamber intraocular lens Posterior chamber intraocular lens   Anterior Vitreous Normal Normal       Fundus Exam      Right Left   Posterior Vitreous Posterior vitreous detachment Posterior vitreous detachment   Disc Normal Normal   C/D Ratio 0.35 0.5   Macula ,, Microaneurysms, no macular thickening, no clinically significant macular edema ,, Microaneurysms, no macular thickening, no clinically significant macular edema   Vessels NPDR- Moderate NPDR- Moderate   Periphery Pavingstone degeneration, Pigmentation Pavingstone degeneration, Pigmentation          IMAGING AND PROCEDURES  Imaging and Procedures for 03/02/20  Color Fundus Photography Optos - OU - Both Eyes       Right Eye Progression has been stable. Disc findings include hemorrhage. Macula : normal observations. Vessels : normal observations. Periphery : RPE abnormality.   Left Eye Progression has been stable. Disc findings include normal observations. Macula : normal observations. Vessels : normal observations. Periphery : RPE abnormality.   Notes OD, splinter hemorrhage on the inferotemporal portion of the nerve resolved from 7 months ago and no new findings.  Incidental posterior vitreous detachment OU.  Moderate nonproliferative diabetic retinopathy with otherwise reticulated RPE degeneration in the mid periphery, OU                ASSESSMENT/PLAN:  Moderate nonproliferative diabetic retinopathy of both eyes (HCC) The nature of moderate nonproliferative diabetic retinopathy was discussed with the patient as well as the need for more frequent follow up to judge for progression. Good  blood glucose, blood pressure, and serum lipid control was recommended as well as avoidance of smoking and maintenance of normal weight.  Close follow up with PCP encouraged.  Optic nerve hemorrhage, right Onset August 14, 2019, resolved as of February 2022  Posterior vitreous detachment of both eyes   The nature of posterior vitreous detachment was discussed with the patient as well as its physiology, its age prevalence, and its possible implication regarding retinal breaks and detachment.  An informational brochure was given to the patient.  All the patient's questions were answered.  The patient was asked to return if new or different flashes or floaters develops.   Patient was instructed to contact office immediately if any changes were noticed. I explained to the patient that vitreous inside the eye is similar to jello inside a bowl. As the jello melts it can start to pull away from the bowl, similarly the vitreous throughout our lives can begin to pull away from the retina. That process is called a posterior vitreous detachment. In some cases, the vitreous can tug hard enough on the retina to form a retinal tear. I discussed with the patient the signs and symptoms of a retinal detachment.  Do not rub the eye.  Early stage nonexudative age-related macular degeneration of both eyes Small  tiny hard drusen technically present on careful dilated examination OU yet this does not rise to the level of intermediate age-related macular degeneration thus does not qualify for use of AREDS 2 as defined by previous prospective controlled studies.  I suggest the patient to take either none or only 1 daily      ICD-10-CM   1. Moderate nonproliferative diabetic retinopathy of both eyes associated with type 2 diabetes mellitus, macular edema presence unspecified (HCC)  [id number] OCT, Retina - OU - Both Eyes    Color Fundus Photography Optos - OU - Both Eyes  2. Optic nerve hemorrhage, right  H47.021   3.  Posterior vitreous detachment of both eyes  H43.813   4. Early stage nonexudative age-related macular degeneration of both eyes  H35.3131     1.  Patient instructed to contact the office promptly for new onset visual acuity declines or distortions  2.  3.  Ophthalmic Meds Ordered this visit:  No orders of the defined types were placed in this encounter.      Return in about 9 months (around 11/30/2020) for DILATE OU, COLOR FP, OCT.  There are no Patient Instructions on file for this visit.   Explained the diagnoses, plan, and follow up with the patient and they expressed understanding.  Patient expressed understanding of the importance of proper follow up care.   Clent Demark Rankin M.D. Diseases & Surgery of the Retina and Vitreous Retina & Diabetic [place] 03/02/20     Abbreviations: M myopia (nearsighted); A astigmatism; H hyperopia (farsighted); P presbyopia; Mrx spectacle prescription;  CTL contact lenses; OD right eye; OS left eye; OU both eyes  XT exotropia; ET esotropia; PEK punctate epithelial keratitis; PEE punctate epithelial erosions; DES dry eye syndrome; MGD meibomian gland dysfunction; ATs artificial tears; PFAT's preservative free artificial tears; [place] nuclear sclerotic cataract; PSC posterior subcapsular cataract; ERM epi-retinal membrane; PVD posterior vitreous detachment; RD retinal detachment; DM diabetes mellitus; DR diabetic retinopathy; NPDR non-proliferative diabetic retinopathy; PDR proliferative diabetic retinopathy; CSME clinically significant macular edema; DME diabetic macular edema; dbh dot blot hemorrhages; CWS cotton wool spot; POAG primary open angle glaucoma; C/D cup-to-disc ratio; HVF humphrey visual field; GVF goldmann visual field; OCT optical coherence tomography; IOP intraocular pressure; BRVO Branch retinal vein occlusion; CRVO central retinal vein occlusion; CRAO central retinal artery occlusion; BRAO branch retinal artery occlusion; RT retinal  tear; SB scleral buckle; PPV pars plana vitrectomy; VH Vitreous hemorrhage; PRP panretinal laser photocoagulation; IVK intravitreal kenalog; VMT vitreomacular traction; MH Macular hole;  NVD neovascularization of the disc; NVE neovascularization elsewhere; AREDS age related eye disease study; ARMD age related macular degeneration; POAG primary open angle glaucoma; EBMD epithelial/anterior basement membrane dystrophy; ACIOL anterior chamber intraocular lens; IOL intraocular lens; PCIOL posterior chamber intraocular lens; Phaco/IOL phacoemulsification with intraocular lens placement; [place] photorefractive keratectomy; LASIK laser assisted in situ keratomileusis; HTN hypertension; DM diabetes mellitus; COPD chronic obstructive pulmonary disease

## 2020-03-02 NOTE — Assessment & Plan Note (Signed)
Onset August 14, 2019, resolved as of February 2022

## 2020-03-02 NOTE — Assessment & Plan Note (Signed)

## 2020-03-09 DIAGNOSIS — E782 Mixed hyperlipidemia: Secondary | ICD-10-CM | POA: Diagnosis not present

## 2020-03-09 DIAGNOSIS — E1159 Type 2 diabetes mellitus with other circulatory complications: Secondary | ICD-10-CM | POA: Diagnosis not present

## 2020-03-09 DIAGNOSIS — Z8679 Personal history of other diseases of the circulatory system: Secondary | ICD-10-CM | POA: Diagnosis not present

## 2020-03-10 ENCOUNTER — Telehealth: Payer: Self-pay | Admitting: Emergency Medicine

## 2020-03-10 DIAGNOSIS — Z8679 Personal history of other diseases of the circulatory system: Secondary | ICD-10-CM | POA: Diagnosis not present

## 2020-03-10 DIAGNOSIS — E1159 Type 2 diabetes mellitus with other circulatory complications: Secondary | ICD-10-CM | POA: Diagnosis not present

## 2020-03-10 DIAGNOSIS — E782 Mixed hyperlipidemia: Secondary | ICD-10-CM | POA: Diagnosis not present

## 2020-03-10 DIAGNOSIS — I2510 Atherosclerotic heart disease of native coronary artery without angina pectoris: Secondary | ICD-10-CM | POA: Diagnosis not present

## 2020-03-10 DIAGNOSIS — E559 Vitamin D deficiency, unspecified: Secondary | ICD-10-CM | POA: Diagnosis not present

## 2020-03-10 DIAGNOSIS — E113393 Type 2 diabetes mellitus with moderate nonproliferative diabetic retinopathy without macular edema, bilateral: Secondary | ICD-10-CM | POA: Diagnosis not present

## 2020-03-10 DIAGNOSIS — I480 Paroxysmal atrial fibrillation: Secondary | ICD-10-CM | POA: Diagnosis not present

## 2020-03-10 DIAGNOSIS — Z95810 Presence of automatic (implantable) cardiac defibrillator: Secondary | ICD-10-CM | POA: Diagnosis not present

## 2020-03-10 NOTE — Telephone Encounter (Signed)
Follow-up transmission received which showed patient is no longer in AF. Responded to mychart message to update patient. Will continue to monitor.

## 2020-03-10 NOTE — Telephone Encounter (Signed)
Alert for atrial high rates on 03/08/20, presenting EGM AT 140s/ VP. Patient reports he feel great today and felt like his HR was fast the last few days. No missed doses of Eliquis, Toprol XL 50 mg QD, or [name] 500 mg BIDHe will send a remote transmission when he returns home to verify HR.

## 2020-03-15 NOTE — Telephone Encounter (Signed)
Application received and faxed off.

## 2020-03-29 ENCOUNTER — Other Ambulatory Visit: Payer: Self-pay

## 2020-03-30 ENCOUNTER — Other Ambulatory Visit: Payer: Self-pay

## 2020-03-30 MED ORDER — AIMOVIG 140 MG/ML ~~LOC~~ SOAJ: 140.0000 mg | SUBCUTANEOUS | 1 refills | Status: DC

## 2020-04-05 ENCOUNTER — Ambulatory Visit (INDEPENDENT_AMBULATORY_CARE_PROVIDER_SITE_OTHER): Payer: Medicare Other | Admitting: Internal Medicine

## 2020-04-05 ENCOUNTER — Encounter: Payer: Self-pay | Admitting: Internal Medicine

## 2020-04-05 ENCOUNTER — Other Ambulatory Visit: Payer: Self-pay

## 2020-04-05 VITALS — BP 100/70 | HR 82 | Ht 73.0 in

## 2020-04-05 DIAGNOSIS — I5022 Chronic systolic (congestive) heart failure: Secondary | ICD-10-CM | POA: Diagnosis not present

## 2020-04-05 DIAGNOSIS — Z95810 Presence of automatic (implantable) cardiac defibrillator: Secondary | ICD-10-CM | POA: Diagnosis not present

## 2020-04-05 DIAGNOSIS — I4819 Other persistent atrial fibrillation: Secondary | ICD-10-CM

## 2020-04-05 DIAGNOSIS — I472 Ventricular tachycardia: Secondary | ICD-10-CM

## 2020-04-05 DIAGNOSIS — R0600 Dyspnea, unspecified: Secondary | ICD-10-CM

## 2020-04-05 DIAGNOSIS — I4729 Other ventricular tachycardia: Secondary | ICD-10-CM

## 2020-04-05 MED ORDER — FUROSEMIDE 20 MG PO TABS: 20.0000 mg | ORAL_TABLET | Freq: Every day | ORAL | 3 refills | Status: DC | PRN

## 2020-04-05 NOTE — Patient Instructions (Signed)
Medication Instructions:   Your physician has recommended you make the following change in your medication:   Take your Furosemide as discussed by Dr Caryl Comes every other day x 4 doses.  Begin with 20mg  if you do not see an increase in urination may take 40mg  and increase by 20mg  up to a total of 80mg .   *If you need a refill on your cardiac medications before your next appointment, please call your pharmacy*   Lab Work: BNP and CBC  If you have labs (blood work) drawn today and your tests are completely normal, you will receive your results only by: Marland Kitchen MyChart Message (if you have MyChart) OR . A paper copy in the mail If you have any lab test that is abnormal or we need to change your treatment, we will call you to review the results.   Testing/Procedures: A chest x-ray takes a picture of the organs and structures inside the chest, including the heart, lungs, and blood vessels. This test can show several things, including, whether the heart is enlarges; whether fluid is building up in the lungs; and whether pacemaker / defibrillator leads are still in place.   Follow-Up: At [facility], you and your health needs are our priority.  As part of our continuing mission to provide you with exceptional heart care, we have created designated Provider Care Teams.  These Care Teams include your primary Cardiologist (physician) and Advanced Practice Providers (APPs -  Physician Assistants and Nurse Practitioners) who all work together to provide you with the care you need, when you need it.  We recommend signing up for the patient portal called "MyChart".  Sign up information is provided on this After Visit Summary.  MyChart is used to connect with patients for Virtual Visits (Telemedicine).  Patients are able to view lab/test results, encounter notes, upcoming appointments, etc.  Non-urgent messages can be sent to your provider as well.   To learn more about what you can do with MyChart, go to  [email or web address].    Your next appointment:   6 month(s)  The format for your next appointment:   In Person  Provider:   [name], MD

## 2020-04-05 NOTE — Progress Notes (Signed)
Patient Care Team: Sueanne Margarita, DO as PCP - General (Internal Medicine) Sherren Mocha, MD as PCP - Cardiology (Cardiology) Deboraha Sprang, MD as PCP - Electrophysiology (Cardiology)   HPI  [name] is a 87 y.o. male Seen in followup for polymorphic ventricular tachycardia for which he has an ICD implantation 2004 with generator change 2010 and  2016 as well as persistent atrial fibrillation which was quite symptomatic and prompted a visit to the A. fib clinic with the plan for anticoagulation and TEE cardioversion.  Currently maintained on Eliquis and dofetilide  Last device implanted with CRT but no LV lead was placed.  History of coronary disease with prior CABG mitral valve repair both of which were done at [facility]  PFO closure at that time.     AFib assoc with slowish Ventricular response but with dyspnea on exertion and fatigue.  He is quite aware of when he is in A. fib and when he is not.  Antiarrhythmics Date Reason stopped  dofetilide 9/20  ongoing   Amiodarone / Tried years ago following CABG stopped but pt does not recall adverse effects      DATE TEST EF   6/14 LHC 35-40% LIMA/ RIMA patent   2/17 Echo   30 %   12/18 LHC   LIMA-LAD and right radial graft-OM- patent proximal cx stenosis with negative FFR  11/19 Echo  35-40% LAE Severe (69/3.0/63) Pulm HTN 50  10/20 Echo  35-40% BAE  Pulm NTH 41 mm       Date Cr K Mg Hgb  9/16  0.99 4.2 2.1   12/17  1.08 4.6 2.1   5/19 1.00 4.5 2.2   10/19 1.04 4.4  13.2 (11/19)  1/21  0.9 4.3 1.9 13.4  2/22 1.04 4.5 1.9 12.2<<13.9     The patient denies chest pain, nocturnal dyspnea, orthopnea.  There have been , lightheadedness or syncope.  Worsening shortness of breath particularly over the last 2-3 months.  This is been associate with a cough that is mildly productive.  Saw pulmonary about 2 years ago.  Reactive airways disease was noted  Device History: STJ dual chamber ICD implanted 2004 for PMVT,  ICM; generator change 2010; gen change 2016 (MDT device) History of appropriate therapy: Yes History of AAD therapy: Yes Thromboembolic risk factors ( age  -2, HTN-1, TIA/CVA-2, Vasc disease -1, CHF-1) for a CHADSVASc Score of >=7   Past Medical History:  Diagnosis Date  . Acute on chronic combined systolic (congestive) and diastolic (congestive) heart failure ([place])   . AICD (automatic cardioverter/defibrillator) present    Medtronicn PPM/ICD  . Carotid stenosis, right    s/p endarterectomy 11/07/18  . Chronic combined systolic (congestive) and diastolic (congestive) heart failure ([place])   . Coronary artery disease    [facility]  . Diabetes mellitus   . Dysrhythmia   . Endocarditis, valve unspecified    Mitral  . Headache    hx migraines 6-7 x mo  . History of migraine headaches   . History of seasonal allergies   . ICD (implantable cardiac defibrillator) in place   . 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  We will monitor this closely and look for changes over the ensuing 6 months.  Repeat visit here in 6 months  . PAF (paroxysmal atrial fibrillation) (HCC)    NO LAA occlusion  at the time of surgery  [name] 2018  . Pleural effusion   . PVC's (premature ventricular contractions)   . Ventricular tachycardia, polymorphic ([place])    status post ICD implantation    Past Surgical History:  Procedure Laterality Date  . CARDIAC CATHETERIZATION  04/03/2007   EF 40%  . CARDIAC CATHETERIZATION  02/06/2006   EF 45%. ANTERIOR HYPOKINESIS  . CARDIAC CATHETERIZATION  05/29/2000   EF 50%. MILD ANTERIOR HYPOKINESIS  . CARDIAC CATHETERIZATION  10/25/1999   EF 50%. SEVERE MITRAL REGURGITATION  . CARDIAC CATHETERIZATION  01/06/2003   EF 50%  . CARDIAC DEFIBRILLATOR PLACEMENT    . CARDIOVERSION N/A 02/07/2019   Procedure: CARDIOVERSION;  Surgeon: Fay Records, MD;   Location: [facility] ENDOSCOPY;  Service: Cardiovascular;  Laterality: N/A;  . CARDIOVERSION N/A 11/24/2019   Procedure: CARDIOVERSION;  Surgeon: Josue Hector, MD;  Location: [facility] ENDOSCOPY;  Service: Cardiovascular;  Laterality: N/A;  . CATARACT EXTRACTION W/ INTRAOCULAR LENS  IMPLANT, BILATERAL    . CHOLECYSTECTOMY N/A 01/08/2019   Procedure: LAPAROSCOPIC CHOLECYSTECTOMY WITH INTRAOPERATIVE CHOLANGIOGRAM;  Surgeon: Donnie Mesa, MD;  Location: [place];  Service: General;  Laterality: N/A;  . CORONARY ARTERY BYPASS GRAFT  2001   2 VESSEL CABG AND MITRAL VALVE REPAIR. HE HAD A LIMA GRAFT TO THE LAD AND RADIAL ARTERY  GRAFT TO THE OBTUSE MARGINAL  OF LEFT CIRCUMFLEX  . ENDARTERECTOMY Right 11/07/2018   Procedure: ENDARTERECTOMY CAROTID RIGHT;  Surgeon: Waynetta Sandy, MD;  Location: [place];  Service: Vascular;  Laterality: Right;  . EP IMPLANTABLE DEVICE N/A 11/06/2014   Procedure: ICD Generator Changeout;  Surgeon: Deboraha Sprang, MD;  Location: [place] CV LAB;  Service: Cardiovascular;  Laterality: N/A;  . ERCP N/A 01/10/2019   Procedure: ENDOSCOPIC RETROGRADE CHOLANGIOPANCREATOGRAPHY (ERCP);  Surgeon: Clarene Essex, MD;  Location: [place];  Service: Endoscopy;  Laterality: N/A;  . FOREIGN BODY REMOVAL Right 06/21/2017   Procedure: FOREIGN BODY REMOVAL ADULT RIGHT RING FINGER;  Surgeon: Leanora Cover, MD;  Location: [place];  Service: Orthopedics;  Laterality: Right;  . HEMORRHOID SURGERY    . HEMORROIDECTOMY    . INTRAVASCULAR PRESSURE WIRE/FFR STUDY N/A 01/05/2017   Procedure: INTRAVASCULAR PRESSURE WIRE/FFR STUDY;  Surgeon: Sherren Mocha, MD;  Location: [place] CV LAB;  Service: Cardiovascular;  Laterality: N/A;  . KNEE ARTHROSCOPY     RIGHT KNEE  . LAPAROSCOPIC APPENDECTOMY N/A 01/08/2019   Procedure: APPENDECTOMY LAPAROSCOPIC;  Surgeon: Donnie Mesa, MD;  Location: [place];  Service: General;  Laterality: N/A;  . LEFT HEART CATHETERIZATION WITH CORONARY ANGIOGRAM N/A 07/04/2012    Procedure: LEFT HEART CATHETERIZATION WITH CORONARY ANGIOGRAM;  Surgeon: Sherren Mocha, MD;  Location: [facility] CATH LAB;  Service: Cardiovascular;  Laterality: N/A;  . MITRAL VALVE REPLACEMENT     No LAA occlusion at the time of surgery  Karie Soda report 2018  . REMOVAL OF STONES  01/10/2019   Procedure: REMOVAL OF STONES;  Surgeon: Clarene Essex, MD;  Location: [place];  Service: Endoscopy;;  . RIGHT/LEFT HEART CATH AND CORONARY/GRAFT ANGIOGRAPHY N/A 01/05/2017   Procedure: RIGHT/LEFT HEART CATH AND CORONARY/GRAFT ANGIOGRAPHY;  Surgeon: Sherren Mocha, MD;  Location: [place] CV LAB;  Service: Cardiovascular;  Laterality: N/A;  . SPHINCTEROTOMY  01/10/2019   Procedure: SPHINCTEROTOMY;  Surgeon: Clarene Essex, MD;  Location: [place];  Service: Endoscopy;;  . TRANSTHORACIC ECHOCARDIOGRAM  04/02/2007   EF 35%    Current Outpatient Medications  Medication Sig Dispense Refill  . acetaminophen (TYLENOL) 500  MG tablet Take 2 tablets (1,000 mg total) by mouth every 8 (eight) hours as needed for mild pain, moderate pain or headache. 30 tablet 0  . atorvastatin (LIPITOR) 80 MG tablet Take 1 tablet (80 mg total) by mouth at bedtime. Hold until liver function has normalized.    . cetirizine (ZYRTEC) 10 MG tablet Take 10 mg by mouth daily.     . Cholecalciferol (VITAMIN D) 125 MCG (5000 UT) CAPS Take 5,000-10,000 Units by mouth See admin instructions. Take 1 capsule (5000 units) by mouth on Tuesdays, Thursdays, Saturdays, & Sundays. Take 2 capsules (10000 units) by mouth on Mondays, Wednesdays, & Fridays    . dofetilide ([name]) 500 MCG capsule Take 1 capsule (500 mcg total) by mouth 2 (two) times daily. 180 capsule 2  . ELIQUIS 5 MG TABS tablet TAKE ONE TABLET BY MOUTH TWICE DAILY 180 tablet 1  . EPIPEN 2-PAK 0.3 MG/0.3ML SOAJ injection Inject 0.3 mg into the muscle daily as needed for anaphylaxis.     Eduard Roux (AIMOVIG) 140 MG/ML SOAJ Inject 140 mg into the skin every 28 (twenty-eight) days.  1.12 mL 1  . fluticasone (FLONASE) 50 MCG/ACT nasal spray Place 1 spray into the nose daily as needed for rhinitis or allergies.    . furosemide (LASIX) 20 MG tablet Take 1 tablet (20 mg total) by mouth daily as needed for fluid. 90 tablet 3  . Magnesium 250 MG TABS Take 250 mg by mouth daily.     . metoprolol succinate (TOPROL-XL) 50 MG 24 hr tablet TAKE ONE TABLET BY MOUTH TWICE DAILY WITH A MEAL 180 tablet 3  . Multiple Vitamin (MULTIVITAMIN WITH MINERALS) TABS tablet Take 1 tablet by mouth every evening. Centrum Silver    . Multiple Vitamins-Minerals (PRESERVISION AREDS 2 PO) Take 1 tablet by mouth in the morning and at bedtime.    . nateglinide (STARLIX) 120 MG tablet Take 120 mg by mouth 2 (two) times daily before a meal.     . pioglitazone (ACTOS) 45 MG tablet Take 45 mg by mouth daily.    . sitaGLIPtin (JANUVIA) 100 MG tablet Take 100 mg by mouth at bedtime.    . Tiotropium Bromide-Olodaterol (STIOLTO RESPIMAT) 2.5-2.5 MCG/ACT AERS Inhale 2 puffs into the lungs daily. 4 g 12   No current facility-administered medications for this visit.    Allergies  Allergen Reactions  . Bee Venom Anaphylaxis  . Latex Other (See Comments)    REDNESS AT REACTION SITE.  Marland Kitchen Metformin Diarrhea  . Rivaroxaban Other (See Comments)    Headaches, blurred vision  . Sotalol Other (See Comments)    "BLUE TOES"- cut his circulation off  . Warfarin Sodium Other (See Comments)    REACTION: migraine headaches/vision impariment    Review of Systems negative except from HPI and PMH  Physical Exam BP 100/70 (BP Location: Left Arm, Patient Position: Sitting, Cuff Size: Normal)   Pulse 82   Ht 6\' 1"  (1.854 m)   BMI 29.03 kg/m   Well developed and well nourished in no acute distress HENT normal Neck supple with JVP 8 Bilateral left greater than right crackles halfway up on the left Device pocket well healed; without hematoma or erythema.  There is no tethering  Regular rate and rhythm, no  gallop 2/6  murmur Abd-soft with active BS No Clubbing cyanosis 2+ edema Skin-warm and dry A & Oriented  Grossly normal sensory and motor function  ECG AV pacing at 82 Intervals 22/20/50  Assessment and  Plan  Dyspnea on exertion  Ischemic heart disease with prior bypass ejection 35-40%  Congestive heart failure-chronic-systolic  Implantable defibrillator-Medtronic dual chamber with LV port plugged.   PVCs//Ventricular tachycardia nonsustained     Atrial fibrillation persistent/recurrent  Dofetilide therapy  COPD-reactive airways disease   Dyspnea is a major complaint and multifactorial.  He has known COPD dating back to 2004 with retrosternal area, 2020 x-ray was not nearly as impressive.  Saw pulmonary in 2020 and was treated with inhalers.  Mild reactive airways disease.  He also has volume overloaded but his suggestion that sputum is accompanying his current exacerbation would suggest that may be a primary pulmonary problem.  We will check BNP to help [name] here. We will also check chest x-ray.  With the volume overload we will put him on a short course of diuretics 20 mg every other day x4 doses.  If 20 mg is inadequate he will increase it to 40 and then to 80 as necessary to prompt diuresis.  In the event that this is not associate with improvement in symptoms, hopefully the BNP will help [name] understand whether this is more likely pulmonary or cardiac as both could be contributing.  He has known LV dysfunction.  Is been a couple years since his last catheterization and not withstanding the absence of angina, could certainly be problematic.  He was also anemic at his last check, at least relatively.  We will check a CBC

## 2020-04-06 ENCOUNTER — Other Ambulatory Visit: Payer: Medicare Other | Admitting: *Deleted

## 2020-04-06 ENCOUNTER — Ambulatory Visit
Admission: RE | Admit: 2020-04-06 | Discharge: 2020-04-06 | Disposition: A | Discharge status: Home / Self Care | Payer: Medicare Other | Source: Ambulatory Visit | Attending: Internal Medicine | Admitting: Internal Medicine

## 2020-04-06 DIAGNOSIS — Z95810 Presence of automatic (implantable) cardiac defibrillator: Secondary | ICD-10-CM | POA: Diagnosis not present

## 2020-04-06 DIAGNOSIS — R0602 Shortness of breath: Secondary | ICD-10-CM | POA: Diagnosis not present

## 2020-04-06 DIAGNOSIS — R0600 Dyspnea, unspecified: Secondary | ICD-10-CM | POA: Diagnosis not present

## 2020-04-06 DIAGNOSIS — I5022 Chronic systolic (congestive) heart failure: Secondary | ICD-10-CM

## 2020-04-06 DIAGNOSIS — I4729 Other ventricular tachycardia: Secondary | ICD-10-CM

## 2020-04-06 DIAGNOSIS — I472 Ventricular tachycardia: Secondary | ICD-10-CM | POA: Diagnosis not present

## 2020-04-06 DIAGNOSIS — I4819 Other persistent atrial fibrillation: Secondary | ICD-10-CM | POA: Diagnosis not present

## 2020-04-07 LAB — CBC
Hematocrit: 37.4 % — ABNORMAL LOW (ref 37.5–51.0)
Hemoglobin: 12.7 g/dL — ABNORMAL LOW (ref 13.0–17.7)
MCH: 33.9 pg — ABNORMAL HIGH (ref 26.6–33.0)
MCHC: 34 g/dL (ref 31.5–35.7)
MCV: 100 fL — ABNORMAL HIGH (ref 79–97)
Platelets: 125 10*3/uL — ABNORMAL LOW (ref 150–450)
RBC: 3.75 x10E6/uL — ABNORMAL LOW (ref 4.14–5.80)
RDW: 12.9 % (ref 11.6–15.4)
WBC: 5.9 10*3/uL (ref 3.4–10.8)

## 2020-04-07 LAB — PRO B NATRIURETIC PEPTIDE: NT-Pro BNP: 2843 pg/mL — ABNORMAL HIGH (ref 0–486)

## 2020-04-11 DIAGNOSIS — I5022 Chronic systolic (congestive) heart failure: Secondary | ICD-10-CM

## 2020-04-14 DIAGNOSIS — D485 Neoplasm of uncertain behavior of skin: Secondary | ICD-10-CM | POA: Diagnosis not present

## 2020-04-14 DIAGNOSIS — L821 Other seborrheic keratosis: Secondary | ICD-10-CM | POA: Diagnosis not present

## 2020-04-14 DIAGNOSIS — Z85828 Personal history of other malignant neoplasm of skin: Secondary | ICD-10-CM | POA: Diagnosis not present

## 2020-04-14 DIAGNOSIS — D225 Melanocytic nevi of trunk: Secondary | ICD-10-CM | POA: Diagnosis not present

## 2020-04-14 DIAGNOSIS — D045 Carcinoma in situ of skin of trunk: Secondary | ICD-10-CM | POA: Diagnosis not present

## 2020-04-14 DIAGNOSIS — D2262 Melanocytic nevi of left upper limb, including shoulder: Secondary | ICD-10-CM | POA: Diagnosis not present

## 2020-04-14 DIAGNOSIS — D1801 Hemangioma of skin and subcutaneous tissue: Secondary | ICD-10-CM | POA: Diagnosis not present

## 2020-04-14 DIAGNOSIS — L570 Actinic keratosis: Secondary | ICD-10-CM | POA: Diagnosis not present

## 2020-04-20 NOTE — Telephone Encounter (Signed)
Spoke with pt and advised per Dr Caryl Comes increase Lasix to 80mg  x 3 days then reduce dosage to 40mg  daily.  BMET in 2 weeks on 05/04/2020.  Pt verbalizes understanding, agrees with current plan and thanked RN for the call.

## 2020-04-21 MED ORDER — FUROSEMIDE 20 MG PO TABS
40.0000 mg | ORAL_TABLET | Freq: Every day | ORAL | 3 refills | Status: DC
Start: 2020-04-21 — End: 2020-05-31

## 2020-04-21 NOTE — Addendum Note (Signed)
Addended by: Thora Lance on: 04/21/2020 12:10 PM   Modules accepted: Orders

## 2020-05-04 ENCOUNTER — Encounter: Payer: Self-pay | Admitting: Cardiovascular Disease

## 2020-05-04 ENCOUNTER — Other Ambulatory Visit: Payer: Self-pay

## 2020-05-04 ENCOUNTER — Ambulatory Visit (INDEPENDENT_AMBULATORY_CARE_PROVIDER_SITE_OTHER): Payer: Medicare Other | Admitting: Cardiovascular Disease

## 2020-05-04 ENCOUNTER — Other Ambulatory Visit: Payer: Medicare Other

## 2020-05-04 VITALS — BP 112/74 | HR 77 | Ht 73.0 in | Wt 210.6 lb

## 2020-05-04 DIAGNOSIS — I5023 Acute on chronic systolic (congestive) heart failure: Secondary | ICD-10-CM | POA: Diagnosis not present

## 2020-05-04 DIAGNOSIS — I4819 Other persistent atrial fibrillation: Secondary | ICD-10-CM

## 2020-05-04 DIAGNOSIS — I5022 Chronic systolic (congestive) heart failure: Secondary | ICD-10-CM | POA: Diagnosis not present

## 2020-05-04 DIAGNOSIS — I4891 Unspecified atrial fibrillation: Secondary | ICD-10-CM | POA: Diagnosis not present

## 2020-05-04 DIAGNOSIS — I2510 Atherosclerotic heart disease of native coronary artery without angina pectoris: Secondary | ICD-10-CM

## 2020-05-04 MED ORDER — ENTRESTO 24-26 MG PO TABS: 1.0000 | ORAL_TABLET | Freq: Two times a day (BID) | ORAL | 3 refills | Status: DC

## 2020-05-04 MED ORDER — METOPROLOL SUCCINATE ER 25 MG PO TB24: 25.0000 mg | ORAL_TABLET | Freq: Two times a day (BID) | ORAL | 3 refills | Status: DC

## 2020-05-04 NOTE — Progress Notes (Signed)
Cardiology Office Note:    Date:  05/04/2020   ID:  [name], DOB 07/13/1931, MRN [id number]  PCP:  Sueanne Margarita, [place]  Cardiologist:  Sherren Mocha, MD  Advanced Practice Provider:  No care team member to display Electrophysiologist:  [name], MD       Referring MD: Sueanne Margarita, DO   Chief Complaint  Patient presents with  . Shortness of Breath    History of Present Illness:    [name] is a 87 y.o. male with a hx of coronary artery disease status post remote CABG, mitral valve repair, hypertension, diabetes, persistent atrial fibrillation, and chronic systolic heart failure, presenting for follow-up evaluation.  The patient is here with his wife today.  He complains of progressive dyspnea, fatigue, leg swelling, and cough.  He was recently evaluated by Dr. Caryl Comes and given a trial of loop diuretic therapy with furosemide.  He states his leg swelling improved with this and he is currently taking 40 mg daily.  His shortness of breath has not improved much.  He was noted to have a markedly elevated BNP, but a clear chest x-ray with no evidence of interstitial edema.  He denies chest pain or pressure at present.  Past Medical History:  Diagnosis Date  . Acute on chronic combined systolic (congestive) and diastolic (congestive) heart failure ([place])   . AICD (automatic cardioverter/defibrillator) present    Medtr[employer]n PPM/ICD  . Carotid stenosis, right    s/p endarterectomy 11/07/18  . Chronic combined systolic (congestive) and diastolic (congestive) heart failure ([place])   . Coronary artery disease    [facility]  [facility]  . Diabetes mellitus   . Dysrhythmia   . Endocarditis, valve unspecified    Mitral  . Headache    hx migraines 6-7 x mo  . History of migraine headaches   . History of seasonal allergies   . ICD (implantable cardiac defibrillator) in place   . 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  We will monitor this closely and look for changes over the ensuing 6 months.  Repeat visit here in 6 months  . PAF (paroxysmal atrial fibrillation) ([place])    NO LAA occlusion at the time of surgery  [name] 2018  . Pleural effusion   . PVC's (premature ventricular contractions)   . Ventricular tachycardia, polymorphic ([place])    status post ICD implantation    Past Surgical History:  Procedure Laterality Date  . CARDIAC CATHETERIZATION  04/03/2007   EF 40%  . CARDIAC CATHETERIZATION  02/06/2006   EF 45%. ANTERIOR HYPOKINESIS  . CARDIAC CATHETERIZATION  05/29/2000   EF 50%. MILD ANTERIOR HYPOKINESIS  . CARDIAC CATHETERIZATION  10/25/1999   EF 50%. SEVERE MITRAL REGURGITATION  . CARDIAC CATHETERIZATION  01/06/2003   EF 50%  . CARDIAC DEFIBRILLATOR PLACEMENT    . CARDIOVERSION N/A 02/07/2019   Procedure: CARDIOVERSION;  Surgeon: Fay Records, MD;  Location: [facility] ENDOSCOPY;  Service: Cardiovascular;  Laterality: N/A;  . CARDIOVERSION N/A 11/24/2019   Procedure: CARDIOVERSION;  Surgeon: Josue Hector, MD;  Location: [facility] ENDOSCOPY;  Service: Cardiovascular;  Laterality: N/A;  . CATARACT EXTRACTION W/ INTRAOCULAR LENS  IMPLANT, BILATERAL    . CHOLECYSTECTOMY N/A 01/08/2019   Procedure: LAPAROSCOPIC CHOLECYSTECTOMY WITH INTRAOPERATIVE CHOLANGIOGRAM;  Surgeon: Donnie Mesa, MD;  Location: [place];  Service: General;  Laterality: N/A;  . CORONARY ARTERY BYPASS GRAFT  2001   2 VESSEL CABG AND MITRAL VALVE REPAIR. HE HAD A LIMA GRAFT TO THE LAD AND RADIAL ARTERY  GRAFT TO THE OBTUSE MARGINAL  OF LEFT CIRCUMFLEX  . ENDARTERECTOMY Right 11/07/2018   Procedure: ENDARTERECTOMY CAROTID RIGHT;  Surgeon: Waynetta Sandy, MD;  Location: [place];  Service: Vascular;  Laterality: Right;  . EP IMPLANTABLE DEVICE N/A 11/06/2014   Procedure: ICD Generator Changeout;  Surgeon: Deboraha Sprang, MD;  Location: [place] CV LAB;  Service: Cardiovascular;  Laterality: N/A;  . ERCP N/A 01/10/2019   Procedure: ENDOSCOPIC RETROGRADE CHOLANGIOPANCREATOGRAPHY (ERCP);  Surgeon: Clarene Essex, MD;  Location: [place];  Service: Endoscopy;  Laterality: N/A;  . FOREIGN BODY REMOVAL Right 06/21/2017   Procedure: FOREIGN BODY REMOVAL ADULT RIGHT RING FINGER;  Surgeon: Leanora Cover, MD;  Location: [place];  Service: Orthopedics;  Laterality: Right;  . HEMORRHOID SURGERY    . HEMORROIDECTOMY    . INTRAVASCULAR PRESSURE WIRE/FFR STUDY N/A 01/05/2017   Procedure: INTRAVASCULAR PRESSURE WIRE/FFR STUDY;  Surgeon: Sherren Mocha, MD;  Location: [place] CV LAB;  Service: Cardiovascular;  Laterality: N/A;  . KNEE ARTHROSCOPY     RIGHT KNEE  . LAPAROSCOPIC APPENDECTOMY N/A 01/08/2019   Procedure: APPENDECTOMY LAPAROSCOPIC;  Surgeon: Donnie Mesa, MD;  Location: [place];  Service: General;  Laterality: N/A;  . LEFT HEART CATHETERIZATION WITH CORONARY ANGIOGRAM N/A 07/04/2012   Procedure: LEFT HEART CATHETERIZATION WITH CORONARY ANGIOGRAM;  Surgeon: Sherren Mocha, MD;  Location: [facility] CATH LAB;  Service: Cardiovascular;  Laterality: N/A;  . MITRAL VALVE REPLACEMENT     No LAA occlusion at the time of surgery  Karie Soda report 2018  . REMOVAL OF STONES  01/10/2019   Procedure: REMOVAL OF STONES;  Surgeon: Clarene Essex, MD;  Location: [place];  Service: Endoscopy;;  . RIGHT/LEFT HEART CATH AND CORONARY/GRAFT ANGIOGRAPHY N/A 01/05/2017   Procedure: RIGHT/LEFT HEART CATH AND CORONARY/GRAFT ANGIOGRAPHY;  Surgeon: Sherren Mocha, MD;  Location: [place] CV LAB;  Service: Cardiovascular;  Laterality: N/A;  . SPHINCTEROTOMY  01/10/2019   Procedure: SPHINCTEROTOMY;  Surgeon: Clarene Essex, MD;  Location: [facility] ENDOSCOPY;  Service: Endoscopy;;  . TRANSTHORACIC ECHOCARDIOGRAM  04/02/2007   EF 35%    Current Medications: Current Meds  Medication Sig  . acetaminophen (TYLENOL) 500 MG tablet Take 2 tablets (1,000 mg total) by mouth  every 8 (eight) hours as needed for mild pain, moderate pain or headache.  Marland Kitchen atorvastatin (LIPITOR) 80 MG tablet Take 1 tablet (80 mg total) by mouth at bedtime. Hold until liver function has normalized.  . cetirizine (ZYRTEC) 10 MG tablet Take 10 mg by mouth daily.   . Cholecalciferol (VITAMIN D) 125 MCG (5000 UT) CAPS Take 5,000-10,000 Units by mouth See admin instructions. Take 1 capsule (5000 units) by mouth on Tuesdays, Thursdays, Saturdays, & Sundays. Take 2 capsules (10000 units) by mouth on Mondays, Wednesdays, & Fridays  . dofetilide ([name]) 500 MCG capsule Take 1 capsule (500 mcg total) by mouth 2 (two) times daily.  Marland Kitchen ELIQUIS 5 MG TABS tablet TAKE ONE TABLET BY MOUTH TWICE DAILY  . EPIPEN 2-PAK 0.3 MG/0.3ML SOAJ injection Inject 0.3 mg into the muscle daily as needed for anaphylaxis.   Eduard Roux (AIMOVIG) 140 MG/ML SOAJ Inject 140 mg into the skin every 28 (twenty-eight) days.  . fluticasone (FLONASE) 50 MCG/ACT nasal spray Place 1 spray into the nose daily as needed for rhinitis or allergies.  . furosemide (LASIX)  20 MG tablet Take 2 tablets (40 mg total) by mouth daily.  . Ibuprofen 200 MG CAPS Take by mouth.  . Magnesium 250 MG TABS Take 250 mg by mouth daily.   . Multiple Vitamin (MULTIVITAMIN WITH MINERALS) TABS tablet Take 1 tablet by mouth every evening. Centrum Silver  . Multiple Vitamins-Minerals (PRESERVISION AREDS 2 PO) Take 1 tablet by mouth in the morning and at bedtime.  . nateglinide (STARLIX) 120 MG tablet Take 120 mg by mouth 2 (two) times daily before a meal.   . pioglitazone (ACTOS) 45 MG tablet Take 45 mg by mouth daily.  . sacubitril-valsartan (ENTRESTO) 24-26 MG Take 1 tablet by mouth 2 (two) times daily.  . sitaGLIPtin (JANUVIA) 100 MG tablet Take 100 mg by mouth at bedtime.  . Tiotropium Bromide-Olodaterol (STIOLTO RESPIMAT) 2.5-2.5 MCG/ACT AERS Inhale 2 puffs into the lungs daily.  . [DISCONTINUED] metoprolol succinate (TOPROL-XL) 50 MG 24 hr tablet  TAKE ONE TABLET BY MOUTH TWICE DAILY WITH A MEAL     Allergies:   Bee venom, Latex, Metformin, Rivaroxaban, Sotalol, and Warfarin sodium   Social History   Socioeconomic History  . Marital status: Married    Spouse name: Mardene Celeste  . Number of children: 2  . Years of education: Not on file  . Highest education level: Associate degree: academic program  Occupational History  . Occupation: retired  Tobacco Use  . Smoking status: Former Smoker    Packs/day: 2.00    Years: 16.00    Pack years: 32.00    Types: Cigarettes    Start date: 02/23/1956    Quit date: 01/31/1971    Years since quitting: 49.2  . Smokeless tobacco: Never Used  Vaping Use  . Vaping Use: Never used  Substance and Sexual Activity  . Alcohol use: Yes    Alcohol/week: 0.0 standard drinks    Comment: 1-2 a week  . Drug use: No  . Sexual activity: Yes  Other Topics Concern  . Not on file  Social History Narrative   Patient is right-handed. He lives with his wife ina 2 story home. He drinks one cup of coffee and a diet coke a day.    Social Determinants of Health   Financial Resource Strain: Not on file  Food Insecurity: Not on file  Transportation Needs: Not on file  Physical Activity: Not on file  Stress: Not on file  Social Connections: Not on file     Family History: The patient's family history includes Cancer in his sister and another family member; Diabetes in an other family member; Emphysema in his father; Heart disease in his father, paternal grandfather, and paternal grandmother; Stroke in his mother.  ROS:   Please see the history of present illness.    All other systems reviewed and are negative.  EKGs/Labs/Other Studies Reviewed:    The following studies were reviewed today: Echo 11-05-2018: IMPRESSIONS    1. Left ventricular ejection fraction, by visual estimation, is 35 to  40%. The left ventricle has moderately decreased function. There is global  left ventricular hypokinesis and  marked apical-basal and septal-lateral  dyssynchrony. There appears to be  apical akinesis.  2. Abnormal septal motion consistent with RV pacemaker.  3. Global right ventricle has moderately reduced systolic function.The  right ventricular size is normal. No increase in right ventricular wall  thickness.  4. Left atrial size was severely dilated.  5. Right atrial size was moderately dilated.  6. Mild mitral valve regurgitation. Mild-moderate mitral stenosis.  Peak  and mean gradients are 16 and 5 mm Hg, respectively, at 70 bpm. Estimated  valve area is 1.4 cm sq by the PHT method.  7. Moderate calcification of the mitral valve leaflet(s).  8. Severe mitral annular calcification.  9. Severe thickening of the mitral valve leaflet(s).  10. The tricuspid valve is normal in structure. Tricuspid valve  regurgitation is mild-moderate.  11. The aortic valve is normal in structure. Aortic valve regurgitation  was not visualized by color flow Doppler.  12. The pulmonic valve was normal in structure. Pulmonic valve  regurgitation is mild by color flow Doppler.  13. Mildly elevated pulmonary artery systolic pressure.  14. A pacer wire is visualized.  15. The inferior vena cava is dilated in size with >50% respiratory  variability, suggesting right atrial pressure of 8 mmHg.  16. Left ventricular diastolic function could not be evaluated due to  atrial fibrillation and mitral stenosis.  37. Compared to February 2020, mitral valve gradients are worse, probably  due to new atrial fibrillation and increased ventricular rate.   Cardiac Cath 01-05-2017: Conclusion  1. Severe 2 vessel CAD with continued patency of the LIMA-LAD and right radial graft-OM 2. Moderate, severely calcified, proximal circumflex stenosis with negative FFR assessment 3. Normal right heart hemodynamics except for a prominent V wave in the PCWP tracing 4. Normal/low LVEDP  Recommend: ongoing medical therapy. Pt  appears to be well-compensated from a hemodynamic perspective.   Diagnostic Dominance: Right  Left Anterior Descending  Ost LAD to Prox LAD lesion is 50% stenosed. The lesion is moderately calcified.  Prox LAD lesion is 50% stenosed.  First Diagonal Branch  Ost 1st Diag to 1st Diag lesion is 50% stenosed. The lesion is calcified.  Left Circumflex  Ost Cx to Prox Cx lesion is 60% stenosed. The lesion is severely calcified. Eccentric calcified plaque in some views appears moderate-severe and in other views appears widely patent.  Second Obtuse Marginal Branch  Ost 2nd Mrg lesion is 95% stenosed.  Right Coronary Artery  Vessel is small. The vessel exhibits minimal luminal irregularities.  Right Radial Artery Graft To 2nd Mrg  Right radial artery. Widely patent  LIMA LIMA Graft To Mid LAD  LIMA graft was visualized by angiography and is normal in caliber. The graft exhibits no disease. Widely patent, imaged non-selectively   Intervention   No interventions have been documented.  Right Heart  Right Heart Pressures LV EDP is normal.   Coronary Diagrams   Diagnostic Dominance: Right     EKG:  EKG is not ordered today.    Recent Labs: 09/24/2019: Magnesium 2.0 11/24/2019: BUN 14; Creatinine, Ser 0.90; Potassium 4.5; Sodium 138 04/06/2020: Hemoglobin 12.7; NT-Pro BNP 2,843; Platelets 125  Recent Lipid Panel    Component Value Date/Time   CHOL 110 11/05/2018 0256   TRIG 90 11/05/2018 0256   HDL 48 11/05/2018 0256   CHOLHDL 2.3 11/05/2018 0256   VLDL 18 11/05/2018 0256   LDLCALC 44 11/05/2018 0256     Risk Assessment/Calculations:    CHA2DS2-VASc Score = 6  This indicates a 9.7% annual risk of stroke. The patient's score is based upon: CHF History: Yes HTN History: Yes Diabetes History: Yes Stroke History: No Vascular Disease History: Yes Age Score: 2 Gender Score: 0      Physical Exam:    VS:  BP 112/74   Pulse 77   Ht 6\' 1"  (1.854 m)   Wt 210 lb 9.6  oz (95.5 kg)  SpO2 97%   BMI 27.79 kg/m     Wt Readings from Last 3 Encounters:  05/04/20 210 lb 9.6 oz (95.5 kg)  12/01/19 220 lb (99.8 kg)  11/24/19 212 lb (96.2 kg)     GEN:  Well nourished, well developed in no acute distress HEENT: Normal NECK: No JVD; No carotid bruits LYMPHATICS: No lymphadenopathy CARDIAC: RRR, no murmurs, rubs, gallops RESPIRATORY:  Clear to auscultation without rales, wheezing or rhonchi  ABDOMEN: Soft, non-tender, non-distended MUSCULOSKELETAL:  No edema; No deformity  SKIN: Warm and dry NEUROLOGIC:  Alert and oriented x 3 PSYCHIATRIC:  Normal affect   ASSESSMENT:    1. Persistent atrial fibrillation ([place])   2. Acute on chronic systolic heart failure ([place])   3. Coronary artery disease involving native coronary artery of native heart without angina pectoris   4. Atrial fibrillation, unspecified type ([place])    PLAN:    In order of problems listed above:  1. Rhythm managed with dofetilide.  Tolerating oral anticoagulation with apixaban.  Followed closely by Dr. Caryl Comes. 2. The patient describes progressive symptoms of shortness of breath and fatigue.  Recently evaluated with lab work showing a BNP of 2843.  Chest x-ray was clear with no evidence of interstitial edema or pleural effusion.  The patient has had some improvement in his leg swelling with furosemide, but has not appreciated any improvement in his shortness of breath.  I reviewed his last echo from 2020 which demonstrated an LVEF of 35 to 40%.  I have recommended an updated echocardiogram to assess LV systolic and diastolic function.  We will add Entresto 24/26 mg.  Will reduce his metoprolol succinate dose to allow blood pressure room for Entresto.  He will take half of his current dose, asking him to reduce metoprolol succinate to 25 mg twice daily.  Will arrange a follow-up visit in 6 weeks.  We will repeat labs at that time.  He will have updated lab work today since diuretics were increased  recently.  Will consider right and left heart catheterization depending on findings of his echo and response to medical therapy.  His last heart catheterization from 2018 is reviewed. 3. Patient is status post CABG.  I reviewed his last cardiac catheterization study.  He is not on antiplatelet therapy because of chronic oral anticoagulation.  He is not currently having any anginal symptoms.        Medication Adjustments/Labs and Tests Ordered: Current medicines are reviewed at length with the patient today.  Concerns regarding medicines are outlined above.  Orders Placed This Encounter  Procedures  . Basic metabolic panel  . ECHOCARDIOGRAM COMPLETE   Meds ordered this encounter  Medications  . metoprolol succinate (TOPROL-XL) 25 MG 24 hr tablet    Sig: Take 1 tablet (25 mg total) by mouth 2 (two) times daily with a meal. Take with or immediately following a meal.    Dispense:  180 tablet    Refill:  3  . sacubitril-valsartan (ENTRESTO) 24-26 MG    Sig: Take 1 tablet by mouth 2 (two) times daily.    Dispense:  180 tablet    Refill:  3    Please Honor Card patient is presenting for Carmie Kanner: [phone]; Juanna Cao: [id number]; ENIDP: [facility]; OEUM: [phone]    Patient Instructions  Medication Instructions:  1) DECREASE TOPROL to 25 mg twice daily 2) START ENTRESTO 24-26 mg twice daily Pharmacy: please enter information for copay assistance: Carmie Kanner: [id number]; Juanna Cao: [id number]; IWPYK: [facility]; DXIP: [phone]  *  If you need a refill on your cardiac medications before your next appointment, please call your pharmacy*  Lab Work: TODAY! BMET If you have labs (blood work) drawn today and your tests are completely normal, you will receive your results only by: Marland Kitchen MyChart Message (if you have MyChart) OR . A paper copy in the mail If you have any lab test that is abnormal or we need to change your treatment, we will call you to review the results.  Testing/Procedures (05/06/20 at 10:35AM,  arrive 15 minutes early for check-in): Your physician has requested that you have an echocardiogram. Echocardiography is a painless test that uses sound waves to create images of your heart. It provides your doctor with information about the size and shape of your heart and how well your heart's chambers and valves are working. This procedure takes approximately one hour. There are no restrictions for this procedure.  Follow-Up: You are scheduled with Richardson Dopp 5/9 at 11:15AM. Please arrive 15 minutes early for check-in.    Signed, Sherren Mocha, MD  05/04/2020 4:42 PM    [place]

## 2020-05-04 NOTE — Patient Instructions (Addendum)
Medication Instructions:  1) DECREASE TOPROL to 25 mg twice daily 2) START ENTRESTO 24-26 mg twice daily Pharmacy: please enter information for copay assistance: Carmie Kanner: [id number]; Juanna Cao: [id number]; [facility]: [facility]; BOMQ: [phone]  *If you need a refill on your cardiac medications before your next appointment, please call your pharmacy*  Lab Work: TODAY! BMET If you have labs (blood work) drawn today and your tests are completely normal, you will receive your results only by: Marland Kitchen MyChart Message (if you have MyChart) OR . A paper copy in the mail If you have any lab test that is abnormal or we need to change your treatment, we will call you to review the results.  Testing/Procedures (05/06/20 at 10:35AM, arrive 15 minutes early for check-in): Your physician has requested that you have an echocardiogram. Echocardiography is a painless test that uses sound waves to create images of your heart. It provides your doctor with information about the size and shape of your heart and how well your heart's chambers and valves are working. This procedure takes approximately one hour. There are no restrictions for this procedure.  Follow-Up: You are scheduled with Richardson Dopp 5/9 at 11:15AM. Please arrive 15 minutes early for check-in.

## 2020-05-05 LAB — BASIC METABOLIC PANEL
BUN/Creatinine Ratio: 19 (ref 10–24)
BUN: 18 mg/dL (ref 8–27)
CO2: 25 mmol/L (ref 20–29)
Calcium: 9.1 mg/dL (ref 8.6–10.2)
Chloride: 96 mmol/L (ref 96–106)
Creatinine, Ser: 0.97 mg/dL (ref 0.76–1.27)
Glucose: 157 mg/dL — ABNORMAL HIGH (ref 65–99)
Potassium: 4.9 mmol/L (ref 3.5–5.2)
Sodium: 135 mmol/L (ref 134–144)
eGFR: 76 mL/min/{1.73_m2} (ref >59)

## 2020-05-06 ENCOUNTER — Ambulatory Visit (HOSPITAL_COMMUNITY): Payer: Medicare Other | Attending: Cardiovascular Disease

## 2020-05-06 ENCOUNTER — Other Ambulatory Visit: Payer: Self-pay

## 2020-05-06 DIAGNOSIS — I5023 Acute on chronic systolic (congestive) heart failure: Secondary | ICD-10-CM | POA: Diagnosis not present

## 2020-05-06 LAB — ECHOCARDIOGRAM COMPLETE
Area-P 1/2: 2.32 cm2
MV VTI: 1.91 cm2
S' Lateral: 3.6 cm

## 2020-05-07 ENCOUNTER — Ambulatory Visit (INDEPENDENT_AMBULATORY_CARE_PROVIDER_SITE_OTHER): Payer: Medicare Other

## 2020-05-07 DIAGNOSIS — I472 Ventricular tachycardia: Secondary | ICD-10-CM | POA: Diagnosis not present

## 2020-05-07 DIAGNOSIS — I4729 Other ventricular tachycardia: Secondary | ICD-10-CM

## 2020-05-11 LAB — CUP PACEART REMOTE DEVICE CHECK
Battery Remaining Longevity: 19 mo
Battery Voltage: 2.91 V
Brady Statistic AP VP Percent: 80.14 %
Brady Statistic AP VS Percent: 3.37 %
Brady Statistic AS VP Percent: 13.81 %
Brady Statistic AS VS Percent: 2.68 %
Brady Statistic RA Percent Paced: 81.2 %
Brady Statistic RV Percent Paced: 91.03 %
Date Time Interrogation Session: 20220408043625
HighPow Impedance: 40 Ohm
HighPow Impedance: 53 Ohm
Implantable Lead Implant Date: 20040309
Implantable Lead Implant Date: 20040309
Implantable Lead Location: 753859
Implantable Lead Location: 753860
Implantable Lead Model: 158
Implantable Lead Model: 5076
Implantable Lead Serial Number: 115061
Implantable Pulse Generator Implant Date: 20161007
Lead Channel Impedance Value: 361 Ohm
Lead Channel Impedance Value: 4047 Ohm
Lead Channel Impedance Value: 4047 Ohm
Lead Channel Impedance Value: 4047 Ohm
Lead Channel Impedance Value: 4047 Ohm
Lead Channel Impedance Value: 4047 Ohm
Lead Channel Impedance Value: 4047 Ohm
Lead Channel Impedance Value: 4047 Ohm
Lead Channel Impedance Value: 4047 Ohm
Lead Channel Impedance Value: 4047 Ohm
Lead Channel Impedance Value: 4047 Ohm
Lead Channel Impedance Value: 456 Ohm
Lead Channel Impedance Value: 456 Ohm
Lead Channel Pacing Threshold Amplitude: 0.5 V
Lead Channel Pacing Threshold Amplitude: 0.625 V
Lead Channel Pacing Threshold Pulse Width: 0.4 ms
Lead Channel Pacing Threshold Pulse Width: 0.4 ms
Lead Channel Sensing Intrinsic Amplitude: 1 mV
Lead Channel Sensing Intrinsic Amplitude: 1 mV
Lead Channel Sensing Intrinsic Amplitude: 9 mV
Lead Channel Sensing Intrinsic Amplitude: 9 mV
Lead Channel Setting Pacing Amplitude: 2 V
Lead Channel Setting Pacing Amplitude: 2.5 V
Lead Channel Setting Pacing Pulse Width: 0.4 ms
Lead Channel Setting Sensing Sensitivity: 0.3 mV

## 2020-05-17 ENCOUNTER — Telehealth: Payer: Self-pay | Admitting: Nurse Practitioner

## 2020-05-17 DIAGNOSIS — R0600 Dyspnea, unspecified: Secondary | ICD-10-CM

## 2020-05-17 DIAGNOSIS — R059 Cough, unspecified: Secondary | ICD-10-CM

## 2020-05-17 DIAGNOSIS — R0609 Other forms of dyspnea: Secondary | ICD-10-CM

## 2020-05-17 DIAGNOSIS — I5023 Acute on chronic systolic (congestive) heart failure: Secondary | ICD-10-CM

## 2020-05-17 NOTE — Telephone Encounter (Signed)
Reviewed patient concerns with Dr. Johnsie Cancel who advised that patient send a remote device report for review of a fib burden. He advised that patient also get a chest x-ray for worsening cough prior to any medication adjustments and contact pulmonology for follow-up.  Called patient and reviewed advice with him. He states he will go for chest x-ray tomorrow morning - he is aware of location. Advised him that once results have been reviewed, someone from our office will contact him. He verbalized understanding and agreement with plan of care and thanked me for the call.

## 2020-05-17 NOTE — Telephone Encounter (Signed)
Called patient in response to MyChart message that he sent today. He has a complex medical history and has seen Drs. Cooper and Dupont within the last 2 months. Complains of worsening cough and dyspnea since starting Entresto after ov with Dr. Burt Knack on 4/5. His metoprolol was decreased at that time to give him room for decrease in BP with Entresto.  At his ov on 3/7 with Dr. Caryl Comes, he advised him to gradually increase furosemide up to 80 mg, which he did for 4 days. Did not note significant improvement of dyspnea during that time.   Reports mouth was very dry and he was up all night going to the bathroom. He is currently taking furosemide 40 mg daily. He feels that he cannot walk across the room without significant SOB. Feels improvement with rest. Feels like it is difficult to get a deep breath. Has felt that dyspnea worsens in the setting of atrial fib and feels that he is currently in a fib.  Bilateral leg swelling has improved. Feels that abdomen is more swollen than usual. Feels better when he lays down at night and denies pnd or orthopnea. Quit smoking many years ago and has seen pulmonology, but not in a year. He was diagnosed w/ashtma-COPD overlap syndrome and continues to use his inhaler. Measured O2 while on the phone w/pulse oximeter and readings range from 88 to 91 while talking to me; states it is usually 95% at rest. Advised that Drs. Burt Knack and Caryl Comes are not in the office today and that I will review his concerns with the DOD, Dr. Johnsie Cancel and call him back with Dr. Kyla Balzarine advice. Patient verbalized understanding and agreement and thanked me for the call.

## 2020-05-17 NOTE — Telephone Encounter (Addendum)
ICD manual transmission received  Presenting Rhythm: Afib since ~ 4/11.  RVR at times.   AF Burden increased to 28.4%.  Noted pt fluid status appears stable per optivol level and thoracic impedence

## 2020-05-18 ENCOUNTER — Ambulatory Visit
Admission: RE | Admit: 2020-05-18 | Discharge: 2020-05-18 | Disposition: A | Discharge status: Home / Self Care | Payer: Medicare Other | Source: Ambulatory Visit | Attending: Cardiovascular Disease | Admitting: Cardiovascular Disease

## 2020-05-18 DIAGNOSIS — I5023 Acute on chronic systolic (congestive) heart failure: Secondary | ICD-10-CM

## 2020-05-18 DIAGNOSIS — R0602 Shortness of breath: Secondary | ICD-10-CM | POA: Diagnosis not present

## 2020-05-18 DIAGNOSIS — R059 Cough, unspecified: Secondary | ICD-10-CM | POA: Diagnosis not present

## 2020-05-18 DIAGNOSIS — R0600 Dyspnea, unspecified: Secondary | ICD-10-CM

## 2020-05-18 DIAGNOSIS — R0609 Other forms of dyspnea: Secondary | ICD-10-CM

## 2020-05-19 DIAGNOSIS — I50810 Right heart failure, unspecified: Secondary | ICD-10-CM | POA: Diagnosis not present

## 2020-05-19 DIAGNOSIS — I4581 Long QT syndrome: Secondary | ICD-10-CM | POA: Diagnosis not present

## 2020-05-19 DIAGNOSIS — J9611 Chronic respiratory failure with hypoxia: Secondary | ICD-10-CM | POA: Diagnosis not present

## 2020-05-19 DIAGNOSIS — J441 Chronic obstructive pulmonary disease with (acute) exacerbation: Secondary | ICD-10-CM | POA: Diagnosis not present

## 2020-05-19 DIAGNOSIS — I272 Pulmonary hypertension, unspecified: Secondary | ICD-10-CM | POA: Diagnosis not present

## 2020-05-19 DIAGNOSIS — I472 Ventricular tachycardia: Secondary | ICD-10-CM | POA: Diagnosis not present

## 2020-05-19 DIAGNOSIS — I480 Paroxysmal atrial fibrillation: Secondary | ICD-10-CM | POA: Diagnosis not present

## 2020-05-19 DIAGNOSIS — R0989 Other specified symptoms and signs involving the circulatory and respiratory systems: Secondary | ICD-10-CM | POA: Diagnosis not present

## 2020-05-19 DIAGNOSIS — D696 Thrombocytopenia, unspecified: Secondary | ICD-10-CM | POA: Diagnosis not present

## 2020-05-19 DIAGNOSIS — E1169 Type 2 diabetes mellitus with other specified complication: Secondary | ICD-10-CM | POA: Diagnosis not present

## 2020-05-19 NOTE — Telephone Encounter (Signed)
Spoke with pt and advised per Dr Johnsie Cancel CXR shows: RUL infiltrate.  Recommends pt follow up with his PCP for antibiotic therapy and repeat  CXR in 3 weeks. Pt with productive cough and increased SOB.  Pt advised will forward information to his PCP Dr Francesco Sor and pt will need to contact PCP ASAP. Pt verbalizes understanding and agrees with current plan.

## 2020-05-19 NOTE — Telephone Encounter (Signed)
Spoke with [name] and advised Dr Caryl Comes and Dr Burt Knack are out of the office.  Offered to schedule an appt with Afib clinic for further evaluation.  [name] declines appointment and states he will see if he has improvement with starting antibiotic d/t having RUL infiltrate diagnosed by CXR ordered per Dr Johnsie Cancel.  Recommendation by Dr Johnsie Cancel was that [name] follow up with his PCP for antibiotic therapy and repeat CXR in 3 weeks.  [name] states he will contact office if he decides to make an appointment with Afib clinic.  Reviewed ED precautions.  [name] verbalizes understanding and agrees with current plan.

## 2020-05-21 NOTE — Progress Notes (Signed)
Remote ICD transmission.   

## 2020-05-24 ENCOUNTER — Ambulatory Visit: Payer: Medicare Other | Admitting: Neurology

## 2020-05-25 NOTE — Telephone Encounter (Signed)
Contacted the patient by telephone.  He is actually feeling better after seeing his primary care physician, starting antibiotics, and using an inhaler.  He has continued on Entresto.  He still reports a cough but otherwise has noted improvement in his symptoms.  His OptiVol levels are reviewed and do not suggest volume overload.  His atrial fibrillation burden is 28% and was greater than or less on his last device interrogation.  He is treated with dofetilide.  I am not sure there again to be any better options for controlling his atrial fibrillation.  I will review this with Dr. Caryl Comes.  We discussed his echocardiogram results which shows stable LV dysfunction with an LVEF of 35 to 40%.  The patient is scheduled to see Richardson Dopp in about 2 weeks.  I recommended that he continue his current medicines and return for evaluation as scheduled.  We will need to consider whether right/left heart catheterization is indicated.  As long as his symptoms are stable/improved, I would favor ongoing medical therapy at this time.

## 2020-05-31 ENCOUNTER — Telehealth: Payer: Self-pay

## 2020-05-31 ENCOUNTER — Other Ambulatory Visit: Payer: Self-pay | Admitting: *Deleted

## 2020-05-31 MED ORDER — AIMOVIG 140 MG/ML ~~LOC~~ SOAJ: 140.0000 mg | SUBCUTANEOUS | 5 refills | Status: DC

## 2020-05-31 MED ORDER — FUROSEMIDE 20 MG PO TABS: 40.0000 mg | ORAL_TABLET | Freq: Every day | ORAL | 3 refills | Status: DC

## 2020-05-31 NOTE — Telephone Encounter (Signed)
Refills needed for Aimovig.

## 2020-06-01 DIAGNOSIS — R0989 Other specified symptoms and signs involving the circulatory and respiratory systems: Secondary | ICD-10-CM | POA: Diagnosis not present

## 2020-06-01 DIAGNOSIS — I272 Pulmonary hypertension, unspecified: Secondary | ICD-10-CM | POA: Diagnosis not present

## 2020-06-01 DIAGNOSIS — I480 Paroxysmal atrial fibrillation: Secondary | ICD-10-CM | POA: Diagnosis not present

## 2020-06-01 DIAGNOSIS — J441 Chronic obstructive pulmonary disease with (acute) exacerbation: Secondary | ICD-10-CM | POA: Diagnosis not present

## 2020-06-01 DIAGNOSIS — I50810 Right heart failure, unspecified: Secondary | ICD-10-CM | POA: Diagnosis not present

## 2020-06-01 DIAGNOSIS — I4581 Long QT syndrome: Secondary | ICD-10-CM | POA: Diagnosis not present

## 2020-06-02 ENCOUNTER — Telehealth: Payer: Self-pay | Admitting: Neurology

## 2020-06-02 NOTE — Telephone Encounter (Signed)
New script sent to [facility].

## 2020-06-02 NOTE — Telephone Encounter (Signed)
Aimovig 140 mg: patient called and said he needs refills sent to [facility]. He is on his last month.   Fax: [phone]

## 2020-06-04 ENCOUNTER — Ambulatory Visit (INDEPENDENT_AMBULATORY_CARE_PROVIDER_SITE_OTHER): Payer: Medicare Other | Admitting: Podiatry

## 2020-06-04 ENCOUNTER — Other Ambulatory Visit: Payer: Self-pay

## 2020-06-04 DIAGNOSIS — G43909 Migraine, unspecified, not intractable, without status migrainosus: Secondary | ICD-10-CM | POA: Insufficient documentation

## 2020-06-04 DIAGNOSIS — E11621 Type 2 diabetes mellitus with foot ulcer: Secondary | ICD-10-CM

## 2020-06-04 DIAGNOSIS — L97411 Non-pressure chronic ulcer of right heel and midfoot limited to breakdown of skin: Secondary | ICD-10-CM | POA: Diagnosis not present

## 2020-06-04 DIAGNOSIS — I739 Peripheral vascular disease, unspecified: Secondary | ICD-10-CM

## 2020-06-04 DIAGNOSIS — J309 Allergic rhinitis, unspecified: Secondary | ICD-10-CM | POA: Insufficient documentation

## 2020-06-04 DIAGNOSIS — E7800 Pure hypercholesterolemia, unspecified: Secondary | ICD-10-CM | POA: Insufficient documentation

## 2020-06-04 NOTE — Progress Notes (Signed)
  Subjective:  Patient ID: [name], male    DOB: Jun 28, 1931,  MRN: [id number]  Chief Complaint  Patient presents with  . Wound Check    Pt states 5 month duration wound on right foot medial aspect. Denies fever/chills/nausea/vomiting and denies drainage. Pt states applied Mupirocin ointment initially.   87 y.o. male presents for wound care. Hx confirmed with patient.  Objective:  Physical Exam: Wound Location: right medial foot Wound Measurement: pinpoint Wound Base: Granular/Healthy Peri-wound: Normal Exudate: None: wound tissue dry wound without warmth, erythema, signs of acute infection  Delayed capillary fill time. Faint DP pulses bilat. Non-palpable PT pulses Assessment:   1. Diabetic ulcer of right midfoot associated with type 2 diabetes mellitus, limited to breakdown of skin ([place])   2. PAD (peripheral artery disease) ([place])     Plan:  Patient was evaluated and treated and all questions answered.  Ulcer Right foot medial -Discussed possible etiology including PAD. -Dressing applied consisting of abx ointment and band-aid -Offload ulcer with silicone bunion shield -Vascular studies eval for PAD as source of poor healing  Return in about 3 weeks (around 06/25/2020) for Vascular study review.

## 2020-06-06 NOTE — Progress Notes (Signed)
Cardiology Office Note:    Date:  06/07/2020   ID:  [name], DOB [birth year], MRN [id number]  PCP:  [name], [facility] Cardiologist:  Tony Mocha, MD Electrophysiologist:  Tony Axe, MD     Referring MD: Tony Margarita, DO   Chief Complaint:  Follow-up (CHF)    Patient Profile:    [name] is a 87 y.o. male with:   Coronary artery disease   Mitral valve disease  S/p CABG, MV repair   Cath 12/18: oLAD 42, pLAD 50, oD1 50; oLCx 60, OM2 95; RCA irregs; R Radial-OM2 and L-LAD patent >> Med rx   Hypertension   Diabetes mellitus   Persistent atrial fibrillation   Dofetilide Rx, Apixaban Rx   (HFrEF) heart failure with reduced ejection fraction   Echocardiogram 10/2018: EF 35-40  Echocardiogram 4/22: EF 35-40   Pulmonary hypertension   Echocardiogram 4/22: RVSP 45.5   Ventricular tachycardia   S/p ICD   Carotid artery disease  S/p R CEA   Abdominal aortic aneurysm   CT 12/2018 5.7 cm (followed by vasc surg)  Prior CV studies: Echocardiogram 05/06/20 EF 35-40, global HK, mild LVH, mildly reduced RVSF, RVSP 45.5 (mod elevated pulmonary artery pressure), severe BAE, trivial MR, mild MS (mean 3 mmHg), mild to mod TR, mild AV calcification, dilated Ao root (40 mm), mild dilation of ascending aorta (37 mm)  VAS US CAROTID DUPLEX BILATERAL 08/29/2019 Narrative Summary: Bilateral Carotids: Velocities consistent with a 1-39% stenosis.  RIGHT/LEFT HEART CATH 01/05/2017 Narrative 1. Severe 2 vessel CAD with continued patency of the LIMA-LAD and right radial graft-OM 2. Moderate, severely calcified, proximal circumflex stenosis with negative FFR assessment 3. Normal right heart hemodynamics except for a prominent V wave in the PCWP tracing 4. Normal/low LVEDP Recommend: ongoing medical therapy. Pt appears to be well-compensated from a hemodynamic perspective.   History of Present Illness: [name] was last seen  by Dr [name] in 4/22 for decompensated HF.  He continued to have shortness of breath despite increasing diuretic Rx.  A f/u echocardiogram showed stable LVF.  He was started on Entresto.  Dr. Burt [name] called him a couple of weeks ago and the pt was feeling better after being treated with antibiotics by his PCP.  He returns for f/u today.  It is felt he will need a R/L heart cath if his symptoms progress.    He is here alone today.  He has been doing much better since starting on antibiotics, prednisone and albuterol.  His breathing has greatly improved.  He has not had chest pain, orthopnea, leg edema, syncope.          Past Medical History:  Diagnosis Date  . Acute on chronic combined systolic (congestive) and diastolic (congestive) heart failure ([place])   . AICD (automatic cardioverter/defibrillator) present    Medtronicn PPM/ICD  . Carotid stenosis, right    s/p endarterectomy 11/07/18  . Chronic combined systolic (congestive) and diastolic (congestive) heart failure ([place])   . Coronary artery disease    [facility]  . Diabetes mellitus   . Dysrhythmia   . Endocarditis, valve unspecified    Mitral  . Headache    hx migraines 6-7 x mo  . History of migraine headaches   . History of seasonal allergies   . ICD (implantable cardiac defibrillator) in place   . 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  We will monitor this closely and look for changes over the ensuing 6 months.  Repeat visit here in 6 months  . PAF (paroxysmal atrial fibrillation) ([place])    NO LAA occlusion at the time of surgery  [name] 2018  . Pleural effusion   . PVC's (premature ventricular contractions)   . Ventricular tachycardia, polymorphic (HCC)    status post ICD implantation    Current Medications: Current Meds  Medication Sig  . acetaminophen (TYLENOL) 500 MG tablet Take 2 tablets  (1,000 mg total) by mouth every 8 (eight) hours as needed for mild pain, moderate pain or headache.  . albuterol (VENTOLIN HFA) 108 (90 Base) MCG/ACT inhaler SMARTSIG:2 Puff(s) By Mouth Every 6 Hours PRN  . atorvastatin (LIPITOR) 80 MG tablet Take 1 tablet (80 mg total) by mouth at bedtime. Hold until liver function has normalized.  . cetirizine (ZYRTEC) 10 MG tablet Take 10 mg by mouth daily.   . Cholecalciferol (VITAMIN D) 125 MCG (5000 UT) CAPS Take 5,000-10,000 Units by mouth See admin instructions. Take 1 capsule (5000 units) by mouth on Tuesdays, Thursdays, Saturdays, & Sundays. Take 2 capsules (10000 units) by mouth on Mondays, Wednesdays, & Fridays  . dofetilide ([name]) 500 MCG capsule Take 1 capsule (500 mcg total) by mouth 2 (two) times daily.  [name] doxycycline (VIBRAMYCIN) 100 MG capsule Take 100 mg by mouth 2 (two) times daily.  [name] ELIQUIS 5 MG TABS tablet TAKE ONE TABLET BY MOUTH TWICE DAILY  . EPIPEN 2-PAK 0.3 MG/0.3ML SOAJ injection Inject 0.3 mg into the muscle daily as needed for anaphylaxis.   [name] (AIMOVIG) 140 MG/ML SOAJ Inject 140 mg into the skin every 28 (twenty-eight) days.  . fluticasone (FLONASE) 50 MCG/ACT nasal spray Place 1 spray into the nose daily as needed for rhinitis or allergies.  . furosemide (LASIX) 20 MG tablet Take 2 tablets (40 mg total) by mouth daily.  . Ibuprofen 200 MG CAPS Take by mouth.  . Magnesium 250 MG TABS Take 250 mg by mouth daily.   . metoprolol succinate (TOPROL-XL) 25 MG 24 hr tablet Take 1 tablet (25 mg total) by mouth 2 (two) times daily with a meal. Take with or immediately following a meal.  . Multiple Vitamin (MULTIVITAMIN WITH MINERALS) TABS tablet Take 1 tablet by mouth every evening. Centrum Silver  . Multiple Vitamins-Minerals (PRESERVISION AREDS 2 PO) Take 1 tablet by mouth in the morning and at bedtime.  . mupirocin ointment (BACTROBAN) 2 % Apply 1 application topically as needed (for irritation).  . nateglinide (STARLIX)  120 MG tablet Take 120 mg by mouth 2 (two) times daily before a meal.   . pioglitazone (ACTOS) 45 MG tablet Take 45 mg by mouth daily.  . predniSONE (DELTASONE) 10 MG tablet Take 10 mg by mouth daily with breakfast.  . sacubitril-valsartan (ENTRESTO) 24-26 MG Take 1 tablet by mouth 2 (two) times daily.  . sitaGLIPtin (JANUVIA) 100 MG tablet Take 100 mg by mouth at bedtime.  . Tiotropium Bromide-Olodaterol (STIOLTO RESPIMAT) 2.5-2.5 MCG/ACT AERS Inhale 2 puffs into the lungs daily.     Allergies:   Bee venom, Latex, Metformin, Rivaroxaban, Sotalol, and Warfarin sodium   Social History   Tobacco Use  . Smoking status: Former Smoker    Packs/day: 2.00    Years: 16.00    Pack years: 32.00    Types: Cigarettes    Start date: 02/23/1956    Quit date:  01/31/1971    Years since quitting: 49.3  . Smokeless tobacco: Never Used  Vaping Use  . Vaping Use: Never used  Substance Use Topics  . Alcohol use: Yes    Alcohol/week: 0.0 standard drinks    Comment: 1-2 a week  . Drug use: No     Family Hx: The patient's family history includes Cancer in his sister and another family member; Diabetes in an other family member; Emphysema in his father; Heart disease in his father, paternal grandfather, and paternal grandmother; Stroke in his mother.  ROS   EKGs/Labs/Other Test Reviewed:    EKG:  EKG is   ordered today.  The ekg ordered today demonstrates AV paced, HR 73  Recent Labs: 09/24/2019: Magnesium 2.0 04/06/2020: Hemoglobin 12.7; NT-Pro BNP 2,843; Platelets 125 05/04/2020: BUN 18; Creatinine, Ser 0.97; Potassium 4.9; Sodium 135   Recent Lipid Panel Lab Results  Component Value Date/Time   CHOL 110 11/05/2018 02:56 AM   TRIG 90 11/05/2018 02:56 AM   HDL 48 11/05/2018 02:56 AM   CHOLHDL 2.3 11/05/2018 02:56 AM   LDLCALC 44 11/05/2018 02:56 AM   Labs obtained through [place] - personally reviewed and interpreted: 06/01/2020: K+ 5.4 05/19/2020: Magnesium 2, ALT 13 11/05/2019: A1c  6.2 07/24/2019: Total cholesterol 135, HDL 49, LDL 61, triglycerides 124    Risk Assessment/Calculations:    CHA2DS2-VASc Score = 6  This indicates a 9.7% annual risk of stroke. The patient's score is based upon: CHF History: Yes HTN History: Yes Diabetes History: Yes Stroke History: No Vascular Disease History: Yes Age Score: 2 Gender Score: 0     Physical Exam:    VS:  BP (!) 118/50   Pulse 73   Ht 6\' 1"  (1.854 m)   Wt 210 lb 12.8 oz (95.6 kg)   SpO2 93%   BMI 27.81 kg/m     Wt Readings from Last 3 Encounters:  06/07/20 210 lb 12.8 oz (95.6 kg)  05/04/20 210 lb 9.6 oz (95.5 kg)  12/01/19 220 lb (99.8 kg)     Constitutional:      Appearance: Healthy appearance. Not in distress.  Neck:     Vascular: JVD normal.  Pulmonary:     Breath sounds: No wheezing. No rales.  Cardiovascular:     Normal rate. Regular rhythm.     Murmurs: There is a grade 2/6 systolic murmur at the LLSB.  Edema:    Peripheral edema absent.  Abdominal:     Palpations: Abdomen is soft. There is no hepatomegaly.  Skin:    General: Skin is warm and dry.  Neurological:     General: No focal deficit present.     Mental Status: Alert and oriented to person, place and time.          ASSESSMENT & PLAN:    1. Coronary artery disease involving native coronary artery of native heart without angina pectoris S/p CABG+MV repair in 2001. Cath in 2018 with patent grafts.  He is doing well without anginal symptoms.  He is not on ASA as he is on Apixaban. Continue statin, beta-blocker.  F/u 4 mos.   2. HFrEF (heart failure with reduced ejection fraction) (HCC) EF 35-40.  Clinical status has improved greatly since starting on Entresto and being treated for pneumonia.  He is now NYHA II.  Volume status is stable.  BP is tenuous and will not likely allow further increase in Entresto or Metoprolol dosages.  I am not certain he could tolerate spironolactone  at this point.  I have asked him to discuss  stopping Actos and trying [place] with his endocrinologist at his next appt.  F/u in 4 mos. I completed assistance paperwork for [facility] for him today.    3. Persistent atrial fibrillation (HCC) Rate is well controlled.  He is tolerating anticoagulation.  Recent Hgb, Creatinine normal.  Continue Apixaban 5 mg twice daily.    4. Ventricular tachycardia ([place]) 5. AICD (automatic cardioverter/defibrillator) present Continue f/u with EP as planned.      Dispo:  Return in about 4 months (around 10/08/2020) for Routine Follow Up, w/ Dr. Burt [name].   Medication Adjustments/Labs and Tests Ordered: Current medicines are reviewed at length with the patient today.  Concerns regarding medicines are outlined above.  Tests Ordered: Orders Placed This Encounter  Procedures  . EKG 12-Lead   Medication Changes: No orders of the defined types were placed in this encounter.   Signed, Richardson Dopp, PA-C  06/07/2020 12:06 PM    [facility] [address], [place], [place]  [address] Phone: [phone]; Fax: [phone]

## 2020-06-07 ENCOUNTER — Encounter: Payer: Self-pay | Admitting: Physician Assistant

## 2020-06-07 ENCOUNTER — Ambulatory Visit (INDEPENDENT_AMBULATORY_CARE_PROVIDER_SITE_OTHER): Payer: Medicare Other | Admitting: Physician Assistant

## 2020-06-07 ENCOUNTER — Other Ambulatory Visit: Payer: Self-pay

## 2020-06-07 VITALS — BP 118/50 | HR 73 | Ht 73.0 in | Wt 210.8 lb

## 2020-06-07 DIAGNOSIS — Z95810 Presence of automatic (implantable) cardiac defibrillator: Secondary | ICD-10-CM | POA: Diagnosis not present

## 2020-06-07 DIAGNOSIS — I10 Essential (primary) hypertension: Secondary | ICD-10-CM

## 2020-06-07 DIAGNOSIS — I5020 Unspecified systolic (congestive) heart failure: Secondary | ICD-10-CM | POA: Diagnosis not present

## 2020-06-07 DIAGNOSIS — I472 Ventricular tachycardia, unspecified: Secondary | ICD-10-CM

## 2020-06-07 DIAGNOSIS — I2510 Atherosclerotic heart disease of native coronary artery without angina pectoris: Secondary | ICD-10-CM | POA: Diagnosis not present

## 2020-06-07 DIAGNOSIS — I4819 Other persistent atrial fibrillation: Secondary | ICD-10-CM

## 2020-06-07 NOTE — Patient Instructions (Signed)
Medication Instructions:  Your physician recommends that you continue on your current medications as directed. Please refer to the Current Medication list given to you today.  *If you need a refill on your cardiac medications before your next appointment, please call your pharmacy*   Lab Work: none If you have labs (blood work) drawn today and your tests are completely normal, you will receive your results only by: Marland Kitchen MyChart Message (if you have MyChart) OR . A paper copy in the mail If you have any lab test that is abnormal or we need to change your treatment, we will call you to review the results.   Testing/Procedures: none   Follow-Up: At [facility], you and your health needs are our priority.  As part of our continuing mission to provide you with exceptional heart care, we have created designated Provider Care Teams.  These Care Teams include your primary Cardiologist (physician) and Advanced Practice Providers (APPs -  Physician Assistants and Nurse Practitioners) who all work together to provide you with the care you need, when you need it.  We recommend signing up for the patient portal called "MyChart".  Sign up information is provided on this After Visit Summary.  MyChart is used to connect with patients for Virtual Visits (Telemedicine).  Patients are able to view lab/test results, encounter notes, upcoming appointments, etc.  Non-urgent messages can be sent to your provider as well.   To learn more about what you can do with MyChart, go to [email or web address].    Your next appointment:   4 month(s)  The format for your next appointment:   In Person  Provider:   Sherren Mocha, MD   Other Instructions

## 2020-06-09 ENCOUNTER — Ambulatory Visit (HOSPITAL_COMMUNITY)
Admission: RE | Admit: 2020-06-09 | Discharge: 2020-06-09 | Disposition: A | Discharge status: Home / Self Care | Payer: Medicare Other | Source: Ambulatory Visit | Attending: Podiatry | Admitting: Podiatry

## 2020-06-09 ENCOUNTER — Other Ambulatory Visit: Payer: Self-pay

## 2020-06-09 DIAGNOSIS — I739 Peripheral vascular disease, unspecified: Secondary | ICD-10-CM | POA: Insufficient documentation

## 2020-06-09 DIAGNOSIS — E11621 Type 2 diabetes mellitus with foot ulcer: Secondary | ICD-10-CM | POA: Diagnosis not present

## 2020-06-09 DIAGNOSIS — L97411 Non-pressure chronic ulcer of right heel and midfoot limited to breakdown of skin: Secondary | ICD-10-CM | POA: Diagnosis not present

## 2020-06-11 ENCOUNTER — Encounter: Payer: Self-pay | Admitting: Pulmonary Disease

## 2020-06-11 ENCOUNTER — Ambulatory Visit (INDEPENDENT_AMBULATORY_CARE_PROVIDER_SITE_OTHER): Payer: Medicare Other | Admitting: Pulmonary Disease

## 2020-06-11 ENCOUNTER — Other Ambulatory Visit: Payer: Self-pay

## 2020-06-11 VITALS — BP 118/72 | HR 85 | Temp 97.3°F | Ht 73.0 in | Wt 208.4 lb

## 2020-06-11 DIAGNOSIS — J449 Chronic obstructive pulmonary disease, unspecified: Secondary | ICD-10-CM

## 2020-06-11 DIAGNOSIS — I2510 Atherosclerotic heart disease of native coronary artery without angina pectoris: Secondary | ICD-10-CM

## 2020-06-11 NOTE — Progress Notes (Signed)
[facility]  Chief Complaint  Patient presents with  . Follow-up    43yr f/u. States he was diagnosed with PNA back in April 2022. Still working to get his strength back. Still using Stiolto.     Constitutional:  BP 118/72   Pulse 85   Temp (!) 97.3 F (36.3 C) (Temporal)   Ht 6\' 1"  (1.854 m)   Wt 208 lb 6.4 oz (94.5 kg)   SpO2 95% Comment: on RA  BMI 27.50 kg/m   Past Medical History:  Non sustained Ventricular Tachycardia, Persistent A fib, s/p AICD, CAD s/p CABG, Migraine HA, DM, systolic CHF.  Past Surgical History:  He  has a past surgical history that includes Mitral valve replacement; Coronary artery bypass graft (2001); Cardiac defibrillator placement; Hemorroidectomy; Knee arthroscopy; Cardiac catheterization (04/03/2007); Cardiac catheterization (02/06/2006); Cardiac catheterization (05/29/2000); Cardiac catheterization (10/25/1999); Cardiac catheterization (01/06/2003); transthoracic echocardiogram (04/02/2007); left heart catheterization with coronary angiogram (N/A, 07/04/2012); Cardiac catheterization (N/A, 11/06/2014); RIGHT/LEFT HEART CATH AND CORONARY/GRAFT ANGIOGRAPHY (N/A, 01/05/2017); INTRAVASCULAR PRESSURE WIRE/FFR STUDY (N/A, 01/05/2017); Hemorrhoid surgery; Cataract extraction w/ intraocular lens  implant, bilateral; Foreign Body Removal (Right, 06/21/2017); Endarterectomy (Right, 11/07/2018); Cholecystectomy (N/A, 01/08/2019); laparoscopic appendectomy (N/A, 01/08/2019); ERCP (N/A, 01/10/2019); sphincterotomy (01/10/2019); removal of stones (01/10/2019); Cardioversion (N/A, 02/07/2019); and Cardioversion (N/A, 11/24/2019).  Brief Summary:  [name] is a 87 y.o. male former smoker with COPD and chronic bronchitis.      Subjective:   He was treated for pneumonia in April.  Slowly recovering.  Has occasional cough with clear sputum.  Not having fever, chest pain, wheeze, hemoptysis, or leg swelling.  Sleeping okay.  Has been using  albuterol tid because he was told he needed to.  Chest xray from Jun 01, 2020 showed clearing of previous infiltrate.  Physical Exam:   Appearance - well kempt   ENMT - no sinus tenderness, no oral exudate, no LAN, Mallampati 3 airway, no stridor  Respiratory - equal breath sounds bilaterally, no wheezing or rales  CV - s1s2 regular rate and rhythm, no murmurs  Ext - no clubbing, no edema  Skin - no rashes  Psych - normal mood and affect   Cardiac Tests:   Echo 05/06/20 >> EF 35 to 40%, mild LVH, mod elevation in PASP, severe LA/RA dilation, mild MS, mild/mod TR, aortic root 40 mm  Social History:  He  reports that he quit smoking about 49 years ago. His smoking use included cigarettes. He started smoking about 64 years ago. He has a 32.00 pack-year smoking history. He has never used smokeless tobacco. He reports current alcohol use. He reports that he does not use drugs.  Family History:  His family history includes Cancer in his sister and another family member; Diabetes in an other family member; Emphysema in his father; Heart disease in his father, paternal grandfather, and paternal grandmother; Stroke in his mother.     Assessment/Plan:   COPD with chronic bronchitis. - he is recovering from recent episode of pneumonia - continue stiolto - advised he can use albuterol prn  Systolic CHF, A fib, CAD, VT. - followed by Dr. Burt Knack and Dr. Caryl Comes with [place]  Time Spent Involved in Patient Care on Day of Examination:  22 minutes  Follow up:  Patient Instructions  Stiolto two puffs daily  Albuterol two puffs every 4 to 6 hours as needed for cough, wheeze, chest congestion, or shortness of breath  Follow up in 6 months  Medication List:   Allergies as of 06/11/2020      Reactions   Bee Venom Anaphylaxis   Latex Other (See Comments)   REDNESS AT REACTION SITE.   Metformin Diarrhea   Rivaroxaban Other (See Comments)   Headaches, blurred vision    Sotalol Other (See Comments)   "BLUE TOES"- cut his circulation off   Warfarin Sodium Other (See Comments)   REACTION: migraine headaches/vision impariment      Medication List       Accurate as of Jun 11, 2020 11:36 AM. If you have any questions, ask your nurse or doctor.        STOP taking these medications   doxycycline 100 MG capsule Commonly known as: VIBRAMYCIN Stopped by: Chesley Mires, MD   predniSONE 10 MG tablet Commonly known as: DELTASONE Stopped by: Chesley Mires, MD     TAKE these medications   acetaminophen 500 MG tablet Commonly known as: TYLENOL Take 2 tablets (1,000 mg total) by mouth every 8 (eight) hours as needed for mild pain, moderate pain or headache.   Aimovig 140 MG/ML Soaj Generic drug: Erenumab-aooe Inject 140 mg into the skin every 28 (twenty-eight) days.   albuterol 108 (90 Base) MCG/ACT inhaler Commonly known as: VENTOLIN HFA SMARTSIG:2 Puff(s) By Mouth Every 6 Hours PRN   atorvastatin 80 MG tablet Commonly known as: LIPITOR Take 1 tablet (80 mg total) by mouth at bedtime. Hold until liver function has normalized.   cetirizine 10 MG tablet Commonly known as: ZYRTEC Take 10 mg by mouth daily.   dofetilide 500 MCG capsule Commonly known as: [name] Take 1 capsule (500 mcg total) by mouth 2 (two) times daily.   Eliquis 5 MG Tabs tablet Generic drug: apixaban TAKE ONE TABLET BY MOUTH TWICE DAILY   Entresto 24-26 MG Generic drug: sacubitril-valsartan Take 1 tablet by mouth 2 (two) times daily.   EpiPen 2-Pak 0.3 mg/0.3 mL Soaj injection Generic drug: EPINEPHrine Inject 0.3 mg into the muscle daily as needed for anaphylaxis.   fluticasone 50 MCG/ACT nasal spray Commonly known as: FLONASE Place 1 spray into the nose daily as needed for rhinitis or allergies.   furosemide 20 MG tablet Commonly known as: LASIX Take 2 tablets (40 mg total) by mouth daily.   Ibuprofen 200 MG Caps Take by mouth.   Magnesium 250 MG Tabs Take 250 mg  by mouth daily.   metoprolol succinate 25 MG 24 hr tablet Commonly known as: TOPROL-XL Take 1 tablet (25 mg total) by mouth 2 (two) times daily with a meal. Take with or immediately following a meal.   multivitamin with minerals Tabs tablet Take 1 tablet by mouth every evening. Centrum Silver   mupirocin ointment 2 % Commonly known as: BACTROBAN Apply 1 application topically as needed (for irritation).   nateglinide 120 MG tablet Commonly known as: STARLIX Take 120 mg by mouth 2 (two) times daily before a meal.   pioglitazone 45 MG tablet Commonly known as: ACTOS Take 45 mg by mouth daily.   PRESERVISION AREDS 2 PO Take 1 tablet by mouth in the morning and at bedtime.   sitaGLIPtin 100 MG tablet Commonly known as: JANUVIA Take 100 mg by mouth at bedtime.   Stiolto Respimat 2.5-2.5 MCG/ACT Aers Generic drug: Tiotropium Bromide-Olodaterol Inhale 2 puffs into the lungs daily.   Vitamin D 125 MCG (5000 UT) Caps Take 5,000-10,000 Units by mouth See admin instructions. Take 1 capsule (5000 units) by mouth on Tuesdays, Thursdays, Saturdays, & Sundays. Take  2 capsules (10000 units) by mouth on Mondays, Wednesdays, & Fridays       Signature:  Chesley Mires, MD [place] Pager - [phone] 06/11/2020, 11:36 AM

## 2020-06-11 NOTE — Patient Instructions (Signed)
Stiolto two puffs daily  Albuterol two puffs every 4 to 6 hours as needed for cough, wheeze, chest congestion, or shortness of breath  Follow up in 6 months

## 2020-06-14 DIAGNOSIS — J189 Pneumonia, unspecified organism: Secondary | ICD-10-CM | POA: Diagnosis not present

## 2020-06-21 ENCOUNTER — Other Ambulatory Visit: Payer: Self-pay | Admitting: Internal Medicine

## 2020-06-21 ENCOUNTER — Other Ambulatory Visit (HOSPITAL_COMMUNITY): Payer: Self-pay | Admitting: Internal Medicine

## 2020-06-21 ENCOUNTER — Ambulatory Visit (HOSPITAL_COMMUNITY)
Admission: RE | Admit: 2020-06-21 | Discharge: 2020-06-21 | Disposition: A | Discharge status: Home / Self Care | Payer: Medicare Other | Source: Ambulatory Visit | Attending: Internal Medicine | Admitting: Internal Medicine

## 2020-06-21 ENCOUNTER — Other Ambulatory Visit: Payer: Self-pay

## 2020-06-21 ENCOUNTER — Telehealth: Payer: Self-pay | Admitting: Vascular Surgery

## 2020-06-21 DIAGNOSIS — K5750 Diverticulosis of both small and large intestine without perforation or abscess without bleeding: Secondary | ICD-10-CM | POA: Diagnosis not present

## 2020-06-21 DIAGNOSIS — J984 Other disorders of lung: Secondary | ICD-10-CM | POA: Diagnosis not present

## 2020-06-21 DIAGNOSIS — R630 Anorexia: Secondary | ICD-10-CM | POA: Diagnosis not present

## 2020-06-21 DIAGNOSIS — I25810 Atherosclerosis of coronary artery bypass graft(s) without angina pectoris: Secondary | ICD-10-CM | POA: Diagnosis not present

## 2020-06-21 DIAGNOSIS — I714 Abdominal aortic aneurysm, without rupture, unspecified: Secondary | ICD-10-CM

## 2020-06-21 DIAGNOSIS — R0600 Dyspnea, unspecified: Secondary | ICD-10-CM | POA: Diagnosis not present

## 2020-06-21 DIAGNOSIS — R0602 Shortness of breath: Secondary | ICD-10-CM

## 2020-06-21 DIAGNOSIS — I712 Thoracic aortic aneurysm, without rupture: Secondary | ICD-10-CM | POA: Diagnosis not present

## 2020-06-21 DIAGNOSIS — I2510 Atherosclerotic heart disease of native coronary artery without angina pectoris: Secondary | ICD-10-CM | POA: Diagnosis not present

## 2020-06-21 DIAGNOSIS — J449 Chronic obstructive pulmonary disease, unspecified: Secondary | ICD-10-CM | POA: Diagnosis not present

## 2020-06-21 DIAGNOSIS — Z1152 Encounter for screening for COVID-19: Secondary | ICD-10-CM | POA: Diagnosis not present

## 2020-06-21 DIAGNOSIS — I4581 Long QT syndrome: Secondary | ICD-10-CM | POA: Diagnosis not present

## 2020-06-21 DIAGNOSIS — I480 Paroxysmal atrial fibrillation: Secondary | ICD-10-CM | POA: Diagnosis not present

## 2020-06-21 DIAGNOSIS — I708 Atherosclerosis of other arteries: Secondary | ICD-10-CM | POA: Diagnosis not present

## 2020-06-21 DIAGNOSIS — I50810 Right heart failure, unspecified: Secondary | ICD-10-CM | POA: Diagnosis not present

## 2020-06-21 MED ORDER — IOHEXOL 350 MG/ML SOLN
100.0000 mL | Freq: Once | INTRAVENOUS | Status: AC | PRN
  Administered 2020-06-21: 100 mL via INTRAVENOUS

## 2020-06-21 MED ORDER — SODIUM CHLORIDE (PF) 0.9 % IJ SOLN
INTRAMUSCULAR | Status: AC
  Filled 2020-06-21: qty 50

## 2020-06-21 NOTE — Telephone Encounter (Signed)
I was called by the nurse practitioner from [facility] regarding CT results for [name].  Apparently has had some increasing abdominal distention with shortness of breath and she got a CT scan.  CT was read by radiology as increase in the size of infrarenal abdominal aortic aneurysm with active mural bleed and no evidence of rupture.  I have reviewed the CT scan and I do not see any signs of rupture or stranding around the aneurysm.  I also reviewed with radiology here at [facility] who agrees with my interpretation.  Active mural bleed is not a pathology.  There is evidence of mural thrombus in the aneurysm with swirling contrast in the aneurysm itself.  Sounds like he is not having any significant abdominal pain.  I will arrange follow-up with Dr. Donzetta Matters on Friday to evaluate for stent graft repair.  Apparently he was scheduled last year and never has his aneurysm repaired.  [name], MD [facility] Office: [place]

## 2020-06-22 ENCOUNTER — Telehealth: Payer: Self-pay | Admitting: Cardiovascular Disease

## 2020-06-22 NOTE — Telephone Encounter (Signed)
[name] was calling to set the patient up with an appt. Patient was seeing at there office on yesterday for Sob, right heart failure as well as had CT done of chest, abdomen, and pelvis area. And an acrotic aneursym. Please advise

## 2020-06-22 NOTE — Telephone Encounter (Signed)
The patient reports he had an appointment at [facility] and is being treated for PNA.  He saw Richardson Dopp a couple weeks ago and symptoms have not progressed. Informed the patient [place] requested an ASAP appointment for SOB. Since he has had several medical visits this week, he reports he will see Dr. Donzetta Matters Friday and hold off on a Cardiology appointment at this time. He understands Dr. Claretha Cooper office note will be sent to Dr. Burt Knack once complete. Will call that patient next week for an update. He was grateful for call and agrees with plan.

## 2020-06-25 ENCOUNTER — Ambulatory Visit: Payer: Medicare Other | Admitting: Vascular Surgery

## 2020-07-02 ENCOUNTER — Ambulatory Visit: Payer: Medicare Other | Admitting: Podiatry

## 2020-07-02 NOTE — Telephone Encounter (Signed)
Called the patient for an update. He states he feels much better after finishing his Augentin and his SOB and cough has resolved. His appointment with Dr. Donzetta Matters is rescheduled to 07/23/20. Scheduled him for follow-up with Dr. Burt Knack in October and he understands to call prior to that time if he needs anything. He was grateful for call and agrees with plan.

## 2020-07-05 DIAGNOSIS — D1039 Benign neoplasm of other parts of mouth: Secondary | ICD-10-CM | POA: Diagnosis not present

## 2020-07-05 DIAGNOSIS — K1120 Sialoadenitis, unspecified: Secondary | ICD-10-CM | POA: Diagnosis not present

## 2020-07-09 ENCOUNTER — Ambulatory Visit (INDEPENDENT_AMBULATORY_CARE_PROVIDER_SITE_OTHER): Payer: Medicare Other | Admitting: Podiatry

## 2020-07-09 ENCOUNTER — Other Ambulatory Visit: Payer: Self-pay

## 2020-07-09 DIAGNOSIS — I739 Peripheral vascular disease, unspecified: Secondary | ICD-10-CM | POA: Diagnosis not present

## 2020-07-09 DIAGNOSIS — E11621 Type 2 diabetes mellitus with foot ulcer: Secondary | ICD-10-CM | POA: Diagnosis not present

## 2020-07-09 DIAGNOSIS — L97411 Non-pressure chronic ulcer of right heel and midfoot limited to breakdown of skin: Secondary | ICD-10-CM | POA: Diagnosis not present

## 2020-07-09 NOTE — Progress Notes (Signed)
  Subjective:  Patient ID: SEYMOUR PAVLAK, male    DOB: May 17, 1931,  MRN: [id number]  Chief Complaint  Patient presents with   Wound Check    Vascular study review   87 y.o. male presents for wound care. Hx confirmed with patient.  Here for review of his vascular studies o Objective:  Physical Exam: Wound Location: right medial foot Wound Measurement: Healed Wound Base: Epithelialized Peri-wound: Normal Exudate: None: wound tissue dry wound without warmth, erythema, signs of acute infection  Delayed capillary fill time. Faint DP pulses bilat. Non-palpable PT pulses  Vascular Studies 5/11 Right: Resting right ankle-brachial index is within normal range. No  evidence of significant right lower extremity arterial disease. The right  toe-brachial index is abnormal. RT great toe pressure = 36 mmHg.   Left: Resting left ankle-brachial index is within normal range. No  evidence of significant left lower extremity arterial disease. The left  toe-brachial index is abnormal. LT Great toe pressure = 49 mmHg.  Assessment:   1. 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 PAD (peripheral artery disease) ([place])      Plan:  Patient was evaluated and treated and all questions answered.  Ulcer Right foot medial -Wound appears healed. -Vascular studies without signs of severe vascular compromise  -At this point will discharge patient with follow-up only as needed  Return if symptoms worsen or fail to improve.

## 2020-07-12 NOTE — Telephone Encounter (Signed)
Dr. Halford Chessman please advise on My Chart message and attachments:    I have been plagued with a persistent lung infection (atypical pneumonia) resulting in an unrelenting cough with phlegm and shortness of breath. I have been working with my PCP, Dr. Francesco Sor and his PA's. They have prescribed the following prescriptions. 05/19/20 Albuterol HFAA 90 MCG Inhaler       05/19/20 Prednisone 20 mg. 10ea. 05/19/20 Doxycycline Hyclate 100 mg. 10ea. 05/24/20 Prednisone 10 mg. 18ea. 06/22/20 Prednisone 10 mg. 21ea. 06/22/20 Amox-Clav 875-125 mg. 20ea.   I saw you on 06/11/20 and as I was in the middle of the prescribed medications we did not discuss the issues as I thought they would clear up. That was my mistake.   Between the two regimes of prescriptions on 06/21/20 I had a CT scan of the Chest, Abdomen and Pelvis (Attached). The Abdomen and Pelvis were included to check the status of my aortic aneurysm. As you will see, there is a mention of atypical pneumonia mentioned in the CTA Chest Findings.   I would really appreciate your looking over the CT scan and the medications that have been tried and then let me know your thoughts. I am available for an office visit any time. [name]     Attachments   CT ANGIO CHEST 06-21-20.docx  Thank you

## 2020-07-13 NOTE — Telephone Encounter (Signed)
Called and spoke with patient, advised of recommendations per Dr. Halford Chessman.  He verbalized understanding.  Patient scheduled to see Dr. Halford Chessman tomorrow 07/14/20.  Nothing further needed.

## 2020-07-13 NOTE — Telephone Encounter (Signed)
Please let him know I have reviewed his CT chest.  He likely needs to have bronchoscopy with airway inspection and sampling.  Please schedule ROV with me later this week in [place] office to discuss in more detail, or with NP in [place] office.

## 2020-07-14 ENCOUNTER — Ambulatory Visit (INDEPENDENT_AMBULATORY_CARE_PROVIDER_SITE_OTHER): Payer: Medicare Other | Admitting: Pulmonary Disease

## 2020-07-14 ENCOUNTER — Telehealth: Payer: Self-pay | Admitting: Pulmonary Disease

## 2020-07-14 ENCOUNTER — Telehealth: Payer: Self-pay | Admitting: Emergency Medicine

## 2020-07-14 ENCOUNTER — Ambulatory Visit (HOSPITAL_COMMUNITY)
Admission: RE | Admit: 2020-07-14 | Discharge: 2020-07-14 | Disposition: A | Discharge status: Home / Self Care | Payer: Medicare Other | Source: Ambulatory Visit | Attending: Pulmonary Disease | Admitting: Pulmonary Disease

## 2020-07-14 ENCOUNTER — Encounter: Payer: Self-pay | Admitting: Pulmonary Disease

## 2020-07-14 ENCOUNTER — Other Ambulatory Visit: Payer: Self-pay | Admitting: Emergency Medicine

## 2020-07-14 ENCOUNTER — Other Ambulatory Visit (HOSPITAL_COMMUNITY)
Admission: RE | Admit: 2020-07-14 | Discharge: 2020-07-14 | Disposition: A | Discharge status: Home / Self Care | Payer: Medicare Other | Source: Ambulatory Visit | Attending: Pulmonary Disease | Admitting: Pulmonary Disease

## 2020-07-14 ENCOUNTER — Other Ambulatory Visit: Payer: Self-pay

## 2020-07-14 VITALS — BP 132/70 | HR 85 | Temp 97.3°F | Ht 73.0 in | Wt 195.0 lb

## 2020-07-14 DIAGNOSIS — I2510 Atherosclerotic heart disease of native coronary artery without angina pectoris: Secondary | ICD-10-CM | POA: Diagnosis not present

## 2020-07-14 DIAGNOSIS — R918 Other nonspecific abnormal finding of lung field: Secondary | ICD-10-CM | POA: Diagnosis not present

## 2020-07-14 DIAGNOSIS — R059 Cough, unspecified: Secondary | ICD-10-CM | POA: Diagnosis not present

## 2020-07-14 DIAGNOSIS — J189 Pneumonia, unspecified organism: Secondary | ICD-10-CM | POA: Insufficient documentation

## 2020-07-14 LAB — COMPREHENSIVE METABOLIC PANEL
ALT: 20 U/L (ref 0–44)
AST: 23 U/L (ref 15–41)
Albumin: 3.8 g/dL (ref 3.5–5.0)
Alkaline Phosphatase: 111 U/L (ref 38–126)
Anion gap: 9 (ref 5–15)
BUN: 18 mg/dL (ref 8–23)
CO2: 26 mmol/L (ref 22–32)
Calcium: 9.4 mg/dL (ref 8.9–10.3)
Chloride: 97 mmol/L — ABNORMAL LOW (ref 98–111)
Creatinine, Ser: 0.93 mg/dL (ref 0.61–1.24)
GFR, Estimated: >60 mL/min (ref >60)
Glucose, Bld: 258 mg/dL — ABNORMAL HIGH (ref 70–99)
Potassium: 4.8 mmol/L (ref 3.5–5.1)
Sodium: 132 mmol/L — ABNORMAL LOW (ref 135–145)
Total Bilirubin: 1.6 mg/dL — ABNORMAL HIGH (ref 0.3–1.2)
Total Protein: 7.6 g/dL (ref 6.5–8.1)

## 2020-07-14 LAB — CBC WITH DIFFERENTIAL/PLATELET
Abs Immature Granulocytes: 0.02 10*3/uL (ref 0.00–0.07)
Basophils Absolute: 0 10*3/uL (ref 0.0–0.1)
Basophils Relative: 1 %
Eosinophils Absolute: 0.3 10*3/uL (ref 0.0–0.5)
Eosinophils Relative: 4 %
HCT: 43.5 % (ref 39.0–52.0)
Hemoglobin: 14.1 g/dL (ref 13.0–17.0)
Immature Granulocytes: 0 %
Lymphocytes Relative: 9 %
Lymphs Abs: 0.6 10*3/uL — ABNORMAL LOW (ref 0.7–4.0)
MCH: 32.8 pg (ref 26.0–34.0)
MCHC: 32.4 g/dL (ref 30.0–36.0)
MCV: 101.2 fL — ABNORMAL HIGH (ref 80.0–100.0)
Monocytes Absolute: 0.6 10*3/uL (ref 0.1–1.0)
Monocytes Relative: 9 %
Neutro Abs: 5.1 10*3/uL (ref 1.7–7.7)
Neutrophils Relative %: 77 %
Platelets: 174 10*3/uL (ref 150–400)
RBC: 4.3 MIL/uL (ref 4.22–5.81)
RDW: 14.1 % (ref 11.5–15.5)
WBC: 6.6 10*3/uL (ref 4.0–10.5)
nRBC: 0 % (ref 0.0–0.2)

## 2020-07-14 LAB — SEDIMENTATION RATE: Sed Rate: 55 mm/hr — ABNORMAL HIGH (ref 0–16)

## 2020-07-14 NOTE — Progress Notes (Signed)
 [name]  Chief Complaint  Patient presents with   Follow-up    Productive cough with brown colored phlegm for about a month    Constitutional:  BP 132/70 (BP Location: Left Arm, Cuff Size: Normal)   Pulse 85   Temp (!) 97.3 F (36.3 C) (Oral)   Ht 6' 1" (1.854 m)   Wt 195 lb (88.5 kg)   SpO2 97% Comment: Room air  BMI 25.73 kg/m   Past Medical History:  Non sustained Ventricular Tachycardia, Persistent A fib, s/p AICD, CAD s/p CABG, Migraine HA, DM, systolic CHF.  Past Surgical History:  He  has a past surgical history that includes Mitral valve replacement; Coronary artery bypass graft (2001); Cardiac defibrillator placement; Hemorroidectomy; Knee arthroscopy; Cardiac catheterization (04/03/2007); Cardiac catheterization (02/06/2006); Cardiac catheterization (05/29/2000); Cardiac catheterization (10/25/1999); Cardiac catheterization (01/06/2003); transthoracic echocardiogram (04/02/2007); left heart catheterization with coronary angiogram (N/A, 07/04/2012); Cardiac catheterization (N/A, 11/06/2014); RIGHT/LEFT HEART CATH AND CORONARY/GRAFT ANGIOGRAPHY (N/A, 01/05/2017); INTRAVASCULAR PRESSURE WIRE/FFR STUDY (N/A, 01/05/2017); Hemorrhoid surgery; Cataract extraction w/ intraocular lens  implant, bilateral; Foreign Body Removal (Right, 06/21/2017); Endarterectomy (Right, 11/07/2018); Cholecystectomy (N/A, 01/08/2019); laparoscopic appendectomy (N/A, 01/08/2019); ERCP (N/A, 01/10/2019); sphincterotomy (01/10/2019); removal of stones (01/10/2019); Cardioversion (N/A, 02/07/2019); and Cardioversion (N/A, 11/24/2019).  Brief Summary:  [name] is a 87 y.o. male former smoker with COPD and chronic bronchitis.      Subjective:   He had pneumonia in April.  He was treated with 2 rounds of antibiotics and 3 rounds of prednisone.  CT scan from May 23 showed pattern suggestive of viral pneumonitis.  He continues to have cough, chest congestion, and  feeling short of breath.  Bringing up clear to brown sputum.  No fever, sweats, or hemoptysis.  No recent change in medications.  He denies exposure to molds.  Not around any animals or birds.  No recent sick exposures.  Physical Exam:   Appearance - well kempt   ENMT - no sinus tenderness, no oral exudate, no LAN, Mallampati 3 airway, no stridor  Respiratory - bronchial breath sounds with inspiratory squeak in Rt upper lung field  CV - s1s2 regular rate and rhythm, no murmurs  Ext - no clubbing, no edema  Skin - no rashes  Psych - normal mood and affect    Chest Imaging:  CT angio chest 06/21/20 >> patchy GGO with most prominent density in Rt upper lobe  Cardiac Tests:  Echo 05/06/20 >> EF 35 to 40%, mild LVH, mod elevation in PASP, severe LA/RA dilation, mild MS, mild/mod TR, aortic root 40 mm  Social History:  He  reports that he quit smoking about 49 years ago. His smoking use included cigarettes. He started smoking about 64 years ago. He has a 32.00 pack-year smoking history. He has never used smokeless tobacco. He reports current alcohol use. He reports that he does not use drugs.  Family History:  His family history includes Cancer in his sister and another family member; Diabetes in an other family member; Emphysema in his father; Heart disease in his father, paternal grandfather, and paternal grandmother; Stroke in his mother.     Assessment/Plan:   Non resolving pneumonia. - will arrange for additional lab testing to include CBC with diff, CMET, HP panel, ESR, ANA, ANCA, RF - will arrange for bronchoscopy with Dr. Byrum; bronchoscopy discussed with patient and risks detailed as bleeding, infection, pneumothorax, and non diagnosis.  Explained he will need to have COVID testing and   come off eliquis 2 days prior to having bronchoscopy done  COPD with chronic bronchitis. - continue stiolto - advised he can use albuterol prn   Systolic CHF, A fib, CAD, VT. - followed by  Dr. Cooper and Dr. Klein with [facility]  Time Spent Involved in Patient Care on Day of Examination:  34 minutes  Follow up:   Patient Instructions  Lab tests and chest xray today  Our office will call you when bronchoscopy is scheduled  Follow up in 2 to 3 weeks in [place] office  Medication List:   Allergies as of 07/14/2020       Reactions   Bee Venom Anaphylaxis   Latex Other (See Comments)   REDNESS AT REACTION SITE.   Metformin Diarrhea   Rivaroxaban Other (See Comments)   Headaches, blurred vision   Sotalol Other (See Comments)   "BLUE TOES"- cut his circulation off   Amiodarone    Other reaction(s): rash, doesn't sound like DRESS syndrome.   Levofloxacin    Other reaction(s): long QT syndrome, so avoid   Warfarin Sodium Other (See Comments)   REACTION: migraine headaches/vision impariment        Medication List        Accurate as of July 14, 2020  1:55 PM. If you have any questions, ask your nurse or doctor.          STOP taking these medications    amoxicillin-clavulanate 875-125 MG tablet Commonly known as: AUGMENTIN Stopped by: Tavone Caesar, MD   predniSONE 10 MG tablet Commonly known as: DELTASONE Stopped by: Philemon Riedesel, MD       TAKE these medications    Aimovig 140 MG/ML Soaj Generic drug: Erenumab-aooe Inject 140 mg into the skin every 28 (twenty-eight) days.   albuterol 108 (90 Base) MCG/ACT inhaler Commonly known as: VENTOLIN HFA SMARTSIG:2 Puff(s) By Mouth Every 6 Hours PRN   atorvastatin 80 MG tablet Commonly known as: LIPITOR Take 1 tablet (80 mg total) by mouth at bedtime. Hold until liver function has normalized.   cetirizine 10 MG tablet Commonly known as: ZYRTEC Take 10 mg by mouth daily.   dofetilide 500 MCG capsule Commonly known as: [name] Take 1 capsule (500 mcg total) by mouth 2 (two) times daily.   Eliquis 5 MG Tabs tablet Generic drug: apixaban TAKE ONE TABLET BY MOUTH TWICE DAILY    empagliflozin 10 MG Tabs tablet Commonly known as: JARDIANCE Take by mouth.   EpiPen 2-Pak 0.3 mg/0.3 mL Soaj injection Generic drug: EPINEPHrine Inject 0.3 mg into the muscle daily as needed for anaphylaxis.   fluticasone 50 MCG/ACT nasal spray Commonly known as: FLONASE Place 1 spray into the nose daily as needed for rhinitis or allergies.   Magnesium 250 MG Tabs Take 250 mg by mouth daily.   metoprolol succinate 25 MG 24 hr tablet Commonly known as: TOPROL-XL Take 1 tablet (25 mg total) by mouth 2 (two) times daily with a meal. Take with or immediately following a meal.   multivitamin with minerals Tabs tablet Take 1 tablet by mouth every evening. Centrum Silver   mupirocin ointment 2 % Commonly known as: BACTROBAN Apply 1 application topically as needed (for irritation).   PRESERVISION AREDS 2 PO Take 1 tablet by mouth in the morning and at bedtime.   sitaGLIPtin 100 MG tablet Commonly known as: JANUVIA Take 100 mg by mouth at bedtime.   Stiolto Respimat 2.5-2.5 MCG/ACT Aers Generic drug: Tiotropium Bromide-Olodaterol Inhale 2 puffs into the   lungs daily.   Vitamin D 125 MCG (5000 UT) Caps Take 5,000-10,000 Units by mouth See admin instructions. Take 1 capsule (5000 units) by mouth on Tuesdays, Thursdays, Saturdays, & Sundays. Take 2 capsules (10000 units) by mouth on Mondays, Wednesdays, & Fridays        Signature:  Mamadou Breon, MD [facility] Pager - [phone] 07/14/2020, 1:55 PM           

## 2020-07-14 NOTE — Telephone Encounter (Signed)
Pt informed of the following:  Covid test 6/20 at 12:00pm  Bronch 6/23 at 1:00pm; 10:30am arrival time @ [facility]   Case [phone]

## 2020-07-14 NOTE — Progress Notes (Signed)
Request placed to arrange for bronchoscopy with fluoroscopy under conscious sedation or MAC to evaluate non-resolving pneumonia.  Patient discussed with Dr. Halford Chessman.  Would like to do at [place] long as an outpatient next week.

## 2020-07-14 NOTE — Patient Instructions (Signed)
Lab tests and chest xray today  Our office will call you when bronchoscopy is scheduled  Follow up in 2 to 3 weeks in [place] office

## 2020-07-14 NOTE — Telephone Encounter (Signed)
87 yo male with non-resolving pneumonia.  He will need to be scheduled for bronchoscopy.  Have discussed with Dr. Lamonte Sakai who will perform the procedure.  He will need to stop eliquis 2 days prior to procedure.  Routing message to Dr. Lamonte Sakai to arrange for bronchoscopy.

## 2020-07-14 NOTE — H&P (View-Only) (Signed)
[facility]  Chief Complaint  Patient presents with   Follow-up    Productive cough with brown colored phlegm for about a month    Constitutional:  BP 132/70 (BP Location: Left Arm, Cuff Size: Normal)   Pulse 85   Temp (!) 97.3 F (36.3 C) (Oral)   Ht _0  (1.854 m)   Wt 195 lb (88.5 kg)   SpO2 97% Comment: Room air  BMI 25.73 kg/m   Past Medical History:  Non sustained Ventricular Tachycardia, Persistent A fib, s/p AICD, CAD s/p CABG, Migraine HA, DM, systolic CHF.  Past Surgical History:  He  has a past surgical history that includes Mitral valve replacement; Coronary artery bypass graft (2001); Cardiac defibrillator placement; Hemorroidectomy; Knee arthroscopy; Cardiac catheterization (04/03/2007); Cardiac catheterization (02/06/2006); Cardiac catheterization (05/29/2000); Cardiac catheterization (10/25/1999); Cardiac catheterization (01/06/2003); transthoracic echocardiogram (04/02/2007); left heart catheterization with coronary angiogram (N/A, 07/04/2012); Cardiac catheterization (N/A, 11/06/2014); RIGHT/LEFT HEART CATH AND CORONARY/GRAFT ANGIOGRAPHY (N/A, 01/05/2017); INTRAVASCULAR PRESSURE WIRE/FFR STUDY (N/A, 01/05/2017); Hemorrhoid surgery; Cataract extraction w/ intraocular lens  implant, bilateral; Foreign Body Removal (Right, 06/21/2017); Endarterectomy (Right, 11/07/2018); Cholecystectomy (N/A, 01/08/2019); laparoscopic appendectomy (N/A, 01/08/2019); ERCP (N/A, 01/10/2019); sphincterotomy (01/10/2019); removal of stones (01/10/2019); Cardioversion (N/A, 02/07/2019); and Cardioversion (N/A, 11/24/2019).  Brief Summary:  [name] is a 87 y.o. male former smoker with COPD and chronic bronchitis.      Subjective:   He had pneumonia in April.  He was treated with 2 rounds of antibiotics and 3 rounds of prednisone.  CT scan from May 23 showed pattern suggestive of viral pneumonitis.  He continues to have cough, chest congestion, and  feeling short of breath.  Bringing up clear to brown sputum.  No fever, sweats, or hemoptysis.  No recent change in medications.  He denies exposure to molds.  Not around any animals or birds.  No recent sick exposures.  Physical Exam:   Appearance - well kempt   ENMT - no sinus tenderness, no oral exudate, no LAN, Mallampati 3 airway, no stridor  Respiratory - bronchial breath sounds with inspiratory squeak in Rt upper lung field  CV - s1s2 regular rate and rhythm, no murmurs  Ext - no clubbing, no edema  Skin - no rashes  Psych - normal mood and affect    Chest Imaging:  CT angio chest 06/21/20 >> patchy GGO with most prominent density in Rt upper lobe  Cardiac Tests:  Echo 05/06/20 >> EF 35 to 40%, mild LVH, mod elevation in PASP, severe LA/RA dilation, mild MS, mild/mod TR, aortic root 40 mm  Social History:  He  reports that he quit smoking about 49 years ago. His smoking use included cigarettes. He started smoking about 64 years ago. He has a 32.00 pack-year smoking history. He has never used smokeless tobacco. He reports current alcohol use. He reports that he does not use drugs.  Family History:  His family history includes Cancer in his sister and another family member; Diabetes in an other family member; Emphysema in his father; Heart disease in his father, paternal grandfather, and paternal grandmother; Stroke in his mother.     Assessment/Plan:   Non resolving pneumonia. - will arrange for additional lab testing to include CBC with diff, CMET, HP panel, ESR, ANA, ANCA, RF - will arrange for bronchoscopy with Dr. Lamonte Sakai; bronchoscopy discussed with patient and risks detailed as bleeding, infection, pneumothorax, and non diagnosis.  Explained he will need to have COVID testing and  come off eliquis 2 days prior to having bronchoscopy done  COPD with chronic bronchitis. - continue stiolto - advised he can use albuterol prn   Systolic CHF, A fib, CAD, VT. - followed by  Dr. Burt Knack and Dr. Caryl Comes with [facility]  Time Spent Involved in Patient Care on Day of Examination:  34 minutes  Follow up:   Patient Instructions  Lab tests and chest xray today  Our office will call you when bronchoscopy is scheduled  Follow up in 2 to 3 weeks in [place] office  Medication List:   Allergies as of 07/14/2020       Reactions   Bee Venom Anaphylaxis   Latex Other (See Comments)   REDNESS AT REACTION SITE.   Metformin Diarrhea   Rivaroxaban Other (See Comments)   Headaches, blurred vision   Sotalol Other (See Comments)   "BLUE TOES"- cut his circulation off   Amiodarone    Other reaction(s): rash, doesn't sound like DRESS syndrome.   Levofloxacin    Other reaction(s): long QT syndrome, so avoid   Warfarin Sodium Other (See Comments)   REACTION: migraine headaches/vision impariment        Medication List        Accurate as of July 14, 2020  1:55 PM. If you have any questions, ask your nurse or doctor.          STOP taking these medications    amoxicillin-clavulanate 875-125 MG tablet Commonly known as: AUGMENTIN Stopped by: Chesley Mires, MD   predniSONE 10 MG tablet Commonly known as: DELTASONE Stopped by: Chesley Mires, MD       TAKE these medications    Aimovig 140 MG/ML Soaj Generic drug: Erenumab-aooe Inject 140 mg into the skin every 28 (twenty-eight) days.   albuterol 108 (90 Base) MCG/ACT inhaler Commonly known as: VENTOLIN HFA SMARTSIG:2 Puff(s) By Mouth Every 6 Hours PRN   atorvastatin 80 MG tablet Commonly known as: LIPITOR Take 1 tablet (80 mg total) by mouth at bedtime. Hold until liver function has normalized.   cetirizine 10 MG tablet Commonly known as: ZYRTEC Take 10 mg by mouth daily.   dofetilide 500 MCG capsule Commonly known as: [name] Take 1 capsule (500 mcg total) by mouth 2 (two) times daily.   Eliquis 5 MG Tabs tablet Generic drug: apixaban TAKE ONE TABLET BY MOUTH TWICE DAILY    empagliflozin 10 MG Tabs tablet Commonly known as: JARDIANCE Take by mouth.   EpiPen 2-Pak 0.3 mg/0.3 mL Soaj injection Generic drug: EPINEPHrine Inject 0.3 mg into the muscle daily as needed for anaphylaxis.   fluticasone 50 MCG/ACT nasal spray Commonly known as: FLONASE Place 1 spray into the nose daily as needed for rhinitis or allergies.   Magnesium 250 MG Tabs Take 250 mg by mouth daily.   metoprolol succinate 25 MG 24 hr tablet Commonly known as: TOPROL-XL Take 1 tablet (25 mg total) by mouth 2 (two) times daily with a meal. Take with or immediately following a meal.   multivitamin with minerals Tabs tablet Take 1 tablet by mouth every evening. Centrum Silver   mupirocin ointment 2 % Commonly known as: BACTROBAN Apply 1 application topically as needed (for irritation).   PRESERVISION AREDS 2 PO Take 1 tablet by mouth in the morning and at bedtime.   sitaGLIPtin 100 MG tablet Commonly known as: JANUVIA Take 100 mg by mouth at bedtime.   Stiolto Respimat 2.5-2.5 MCG/ACT Aers Generic drug: Tiotropium Bromide-Olodaterol Inhale 2 puffs into the  lungs daily.   Vitamin D 125 MCG (5000 UT) Caps Take 5,000-10,000 Units by mouth See admin instructions. Take 1 capsule (5000 units) by mouth on Tuesdays, Thursdays, Saturdays, & Sundays. Take 2 capsules (10000 units) by mouth on Mondays, Wednesdays, & Fridays        Signature:  Chesley Mires, MD [place] Pager - [phone] 07/14/2020, 1:55 PM

## 2020-07-15 LAB — RHEUMATOID FACTOR: Rheumatoid fact SerPl-aCnc: <10 IU/mL (ref <14.0)

## 2020-07-15 LAB — ANA W/REFLEX IF POSITIVE: Anti Nuclear Antibody (ANA): NEGATIVE

## 2020-07-16 ENCOUNTER — Other Ambulatory Visit: Payer: Self-pay

## 2020-07-19 ENCOUNTER — Other Ambulatory Visit (HOSPITAL_COMMUNITY)
Admission: RE | Admit: 2020-07-19 | Discharge: 2020-07-19 | Disposition: A | Discharge status: Home / Self Care | Payer: Medicare Other | Source: Ambulatory Visit | Attending: Emergency Medicine | Admitting: Emergency Medicine

## 2020-07-19 DIAGNOSIS — Z01812 Encounter for preprocedural laboratory examination: Secondary | ICD-10-CM | POA: Diagnosis not present

## 2020-07-19 DIAGNOSIS — Z20822 Contact with and (suspected) exposure to covid-19: Secondary | ICD-10-CM | POA: Insufficient documentation

## 2020-07-19 LAB — SARS CORONAVIRUS 2 (TAT 6-24 HRS): SARS Coronavirus 2: NEGATIVE

## 2020-07-22 ENCOUNTER — Ambulatory Visit (HOSPITAL_COMMUNITY)
Admission: RE | Admit: 2020-07-22 | Discharge: 2020-07-22 | Disposition: A | Discharge status: Home / Self Care | Payer: Medicare Other | Attending: Emergency Medicine | Admitting: Emergency Medicine

## 2020-07-22 ENCOUNTER — Ambulatory Visit (HOSPITAL_COMMUNITY): Payer: Medicare Other

## 2020-07-22 ENCOUNTER — Encounter (HOSPITAL_COMMUNITY): Payer: Self-pay | Admitting: Emergency Medicine

## 2020-07-22 ENCOUNTER — Encounter (HOSPITAL_COMMUNITY)
Admission: RE | Disposition: A | Discharge status: Home / Self Care | Payer: Self-pay | Source: Home / Self Care | Attending: Emergency Medicine

## 2020-07-22 ENCOUNTER — Ambulatory Visit (HOSPITAL_COMMUNITY): Payer: Medicare Other | Admitting: Anesthesiology

## 2020-07-22 ENCOUNTER — Other Ambulatory Visit: Payer: Self-pay

## 2020-07-22 DIAGNOSIS — Z833 Family history of diabetes mellitus: Secondary | ICD-10-CM | POA: Diagnosis not present

## 2020-07-22 DIAGNOSIS — Z881 Allergy status to other antibiotic agents status: Secondary | ICD-10-CM | POA: Diagnosis not present

## 2020-07-22 DIAGNOSIS — I4819 Other persistent atrial fibrillation: Secondary | ICD-10-CM | POA: Diagnosis not present

## 2020-07-22 DIAGNOSIS — Z87891 Personal history of nicotine dependence: Secondary | ICD-10-CM | POA: Diagnosis not present

## 2020-07-22 DIAGNOSIS — R059 Cough, unspecified: Secondary | ICD-10-CM | POA: Insufficient documentation

## 2020-07-22 DIAGNOSIS — Z952 Presence of prosthetic heart valve: Secondary | ICD-10-CM | POA: Diagnosis not present

## 2020-07-22 DIAGNOSIS — Z809 Family history of malignant neoplasm, unspecified: Secondary | ICD-10-CM | POA: Diagnosis not present

## 2020-07-22 DIAGNOSIS — J181 Lobar pneumonia, unspecified organism: Secondary | ICD-10-CM | POA: Diagnosis not present

## 2020-07-22 DIAGNOSIS — J189 Pneumonia, unspecified organism: Secondary | ICD-10-CM | POA: Insufficient documentation

## 2020-07-22 DIAGNOSIS — I700 Atherosclerosis of aorta: Secondary | ICD-10-CM | POA: Diagnosis not present

## 2020-07-22 DIAGNOSIS — Z8249 Family history of ischemic heart disease and other diseases of the circulatory system: Secondary | ICD-10-CM | POA: Insufficient documentation

## 2020-07-22 DIAGNOSIS — J440 Chronic obstructive pulmonary disease with acute lower respiratory infection: Secondary | ICD-10-CM | POA: Diagnosis not present

## 2020-07-22 DIAGNOSIS — Z95810 Presence of automatic (implantable) cardiac defibrillator: Secondary | ICD-10-CM | POA: Diagnosis not present

## 2020-07-22 DIAGNOSIS — Z950 Presence of cardiac pacemaker: Secondary | ICD-10-CM | POA: Diagnosis not present

## 2020-07-22 DIAGNOSIS — I5022 Chronic systolic (congestive) heart failure: Secondary | ICD-10-CM | POA: Insufficient documentation

## 2020-07-22 DIAGNOSIS — I2510 Atherosclerotic heart disease of native coronary artery without angina pectoris: Secondary | ICD-10-CM | POA: Insufficient documentation

## 2020-07-22 DIAGNOSIS — Z91030 Bee allergy status: Secondary | ICD-10-CM | POA: Diagnosis not present

## 2020-07-22 DIAGNOSIS — Z888 Allergy status to other drugs, medicaments and biological substances status: Secondary | ICD-10-CM | POA: Insufficient documentation

## 2020-07-22 DIAGNOSIS — I517 Cardiomegaly: Secondary | ICD-10-CM | POA: Diagnosis not present

## 2020-07-22 DIAGNOSIS — Z91040 Latex allergy status: Secondary | ICD-10-CM | POA: Insufficient documentation

## 2020-07-22 DIAGNOSIS — Z825 Family history of asthma and other chronic lower respiratory diseases: Secondary | ICD-10-CM | POA: Insufficient documentation

## 2020-07-22 DIAGNOSIS — E782 Mixed hyperlipidemia: Secondary | ICD-10-CM | POA: Diagnosis not present

## 2020-07-22 DIAGNOSIS — Z823 Family history of stroke: Secondary | ICD-10-CM | POA: Insufficient documentation

## 2020-07-22 DIAGNOSIS — Z951 Presence of aortocoronary bypass graft: Secondary | ICD-10-CM | POA: Insufficient documentation

## 2020-07-22 DIAGNOSIS — R0602 Shortness of breath: Secondary | ICD-10-CM

## 2020-07-22 DIAGNOSIS — J984 Other disorders of lung: Secondary | ICD-10-CM | POA: Diagnosis not present

## 2020-07-22 DIAGNOSIS — I493 Ventricular premature depolarization: Secondary | ICD-10-CM | POA: Diagnosis not present

## 2020-07-22 DIAGNOSIS — I714 Abdominal aortic aneurysm, without rupture: Secondary | ICD-10-CM | POA: Diagnosis not present

## 2020-07-22 DIAGNOSIS — E119 Type 2 diabetes mellitus without complications: Secondary | ICD-10-CM | POA: Insufficient documentation

## 2020-07-22 DIAGNOSIS — R847 Abnormal histological findings in specimens from respiratory organs and thorax: Secondary | ICD-10-CM | POA: Diagnosis not present

## 2020-07-22 DIAGNOSIS — Z9889 Other specified postprocedural states: Secondary | ICD-10-CM | POA: Diagnosis not present

## 2020-07-22 HISTORY — PX: BRONCHIAL BRUSHINGS: SHX5108

## 2020-07-22 HISTORY — PX: BRONCHIAL WASHINGS: SHX5105

## 2020-07-22 HISTORY — PX: BRONCHIAL BIOPSY: SHX5109

## 2020-07-22 HISTORY — PX: VIDEO BRONCHOSCOPY: SHX5072

## 2020-07-22 LAB — GLUCOSE, CAPILLARY: Glucose-Capillary: 160 mg/dL — ABNORMAL HIGH (ref 70–99)

## 2020-07-22 SURGERY — BRONCHOSCOPY, WITH FLUOROSCOPY
Anesthesia: General | Laterality: Right

## 2020-07-22 MED ORDER — LACTATED RINGERS IV SOLN: INTRAVENOUS | Status: DC

## 2020-07-22 MED ORDER — LIDOCAINE HCL (PF) 1 % IJ SOLN
2.0000 mL | Freq: Once | INTRAMUSCULAR | Status: DC
  Filled 2020-07-22: qty 2

## 2020-07-22 MED ORDER — ONDANSETRON HCL 4 MG/2ML IJ SOLN
INTRAMUSCULAR | Status: DC | PRN
  Administered 2020-07-22: 4 mg via INTRAVENOUS

## 2020-07-22 MED ORDER — ATORVASTATIN CALCIUM 80 MG PO TABS: 80.0000 mg | ORAL_TABLET | Freq: Every day | ORAL | Status: DC

## 2020-07-22 MED ORDER — STIOLTO RESPIMAT 2.5-2.5 MCG/ACT IN AERS: 2.0000 | INHALATION_SPRAY | Freq: Every day | RESPIRATORY_TRACT | Status: DC

## 2020-07-22 MED ORDER — DEXAMETHASONE SODIUM PHOSPHATE 10 MG/ML IJ SOLN
INTRAMUSCULAR | Status: DC | PRN
  Administered 2020-07-22: 5 mg via INTRAVENOUS

## 2020-07-22 MED ORDER — SUCCINYLCHOLINE CHLORIDE 20 MG/ML IJ SOLN
INTRAMUSCULAR | Status: DC | PRN
  Administered 2020-07-22: 80 mg via INTRAVENOUS

## 2020-07-22 MED ORDER — PROPOFOL 10 MG/ML IV BOLUS
INTRAVENOUS | Status: DC | PRN
  Administered 2020-07-22: 200 mg via INTRAVENOUS

## 2020-07-22 MED ORDER — LIDOCAINE 2% (20 MG/ML) 5 ML SYRINGE
INTRAMUSCULAR | Status: DC | PRN
  Administered 2020-07-22: 60 mg via INTRAVENOUS

## 2020-07-22 MED ORDER — PHENYLEPHRINE 40 MCG/ML (10ML) SYRINGE FOR IV PUSH (FOR BLOOD PRESSURE SUPPORT)
PREFILLED_SYRINGE | INTRAVENOUS | Status: DC | PRN
  Administered 2020-07-22: 160 ug via INTRAVENOUS
  Administered 2020-07-22: 120 ug via INTRAVENOUS
  Administered 2020-07-22: 80 ug via INTRAVENOUS
  Administered 2020-07-22: 120 ug via INTRAVENOUS

## 2020-07-22 MED ORDER — FENTANYL CITRATE (PF) 100 MCG/2ML IJ SOLN
INTRAMUSCULAR | Status: DC | PRN
  Administered 2020-07-22 (×2): 50 ug via INTRAVENOUS

## 2020-07-22 MED ORDER — FENTANYL CITRATE (PF) 100 MCG/2ML IJ SOLN
INTRAMUSCULAR | Status: AC
  Filled 2020-07-22: qty 2

## 2020-07-22 MED ORDER — ELIQUIS 5 MG PO TABS: 5.0000 mg | ORAL_TABLET | Freq: Two times a day (BID) | ORAL | Status: DC

## 2020-07-22 NOTE — Anesthesia Preprocedure Evaluation (Signed)
Anesthesia Evaluation  Patient identified by MRN, date of birth, ID band Patient awake    Reviewed: Allergy & Precautions, NPO status , Patient's Chart, lab work & pertinent test results  Airway Mallampati: III  TM Distance: >3 FB Neck ROM: Full    Dental no notable dental hx.    Pulmonary asthma , pneumonia, COPD,  COPD inhaler, former smoker,    Pulmonary exam normal breath sounds clear to auscultation       Cardiovascular + CAD and +CHF  Normal cardiovascular exam+ dysrhythmias Atrial Fibrillation + Cardiac Defibrillator  Rhythm:Regular Rate:Normal  ECHO: 1. Global hypokinesis worse in the mid-apical septal and apical myocardium. Left ventricular ejection fraction, by estimation, is 35 to 40%. The left ventricle has moderately decreased function. The left ventricle demonstrates global hypokinesis. There is mild left ventricular hypertrophy of the posterior segment. Left  ventricular diastolic parameters are indeterminate. Elevated left ventricular end-diastolic pressure.  2. Right ventricular systolic function is mildly reduced. The right ventricular size is normal. There is moderately elevated pulmonary artery systolic pressure.  3. Left atrial size was severely dilated.  4. Right atrial size was severely dilated.  5. Mean mitral valve gradient unchanged from 10/2018. The mitral valve is degenerative. Trivial mitral valve regurgitation. Mild mitral stenosis.  The mean mitral valve gradient is 3.0 mmHg.  6. Tricuspid valve regurgitation is mild to moderate.  7. The aortic valve is normal in structure. There is mild calcification of the aortic valve. There is mild thickening of the aortic valve. Aortic valve regurgitation is not visualized. No aortic stenosis is present.  8. Aortic dilatation noted. There is mild dilatation of the aortic root, measuring 40 mm. There is mild dilatation of the ascending aorta, measuring 37 mm.   9. The inferior vena cava is normal in size with greater than 50% respiratory variability, suggesting right atrial pressure of 3 mmHg.    Neuro/Psych  Headaches, TIAnegative psych ROS   GI/Hepatic negative GI ROS, Neg liver ROS,   Endo/Other  diabetes, Oral Hypoglycemic Agents  Renal/GU negative Renal ROS     Musculoskeletal negative musculoskeletal ROS (+)   Abdominal   Peds  Hematology HLD   Anesthesia Other Findings PNEUMONIA OF RIGHT UPPER LOBE DUE TO INFECTIOUS ORGANISM  Reproductive/Obstetrics                             Anesthesia Physical Anesthesia Plan  ASA: 3  Anesthesia Plan: General   Post-op Pain Management:    Induction: Intravenous  PONV Risk Score and Plan: 2 and Treatment may vary due to age or medical condition, Ondansetron and Dexamethasone  Airway Management Planned: Oral ETT  Additional Equipment:   Intra-op Plan:   Post-operative Plan: Extubation in OR  Informed Consent: I have reviewed the patients History and Physical, chart, labs and discussed the procedure including the risks, benefits and alternatives for the proposed anesthesia with the patient or authorized representative who has indicated his/her understanding and acceptance.     Dental advisory given  Plan Discussed with: CRNA  Anesthesia Plan Comments:         Anesthesia Quick Evaluation

## 2020-07-22 NOTE — Interval H&P Note (Signed)
History and Physical Interval Note:  07/22/2020 12:35 PM  [name]  has presented today for surgery, with the diagnosis of PNEUMONIA OF RIGHT UPPER LOBE DUE TO INFECTIOUS ORGANISM.  The various methods of treatment have been discussed with the patient and family. After consideration of risks, benefits and other options for treatment, the patient has consented to  Procedure(s) with comments: VIDEO BRONCHOSCOPY WITH FLUORO, BAL AND BIOPSY (Right) - CONSCIOUS SEDATION OR MAC or GENERAL ANESTHESIA as a surgical intervention.    He has continued to have cough with brownish mucous, no blood. No fevers. He has been using albuterol prn - seems to help him clear sputum.   Plan is for bronchoscopy under general anesthesia, BAL, brushings and possible biopsies.   The patient's history has been reviewed, patient examined, no change in status, stable for surgery.  I have reviewed the patient's chart and labs.  Questions were answered to the patient's satisfaction.     Collene Gobble

## 2020-07-22 NOTE — Discharge Instructions (Signed)
Flexible Bronchoscopy, Care After This sheet gives you information about how to care for yourself after your test. Your doctor may also give you more specific instructions. If you have problems or questions, contact your doctor. Follow these instructions at home: Eating and drinking Do not eat or drink anything (not even water) for 2 hours after your test, or until your numbing medicine (local anesthetic) wears off. When your numbness is gone and your cough and gag reflexes have come back, you may: Eat only soft foods. Slowly drink liquids. The day after the test, go back to your normal diet. Driving Do not drive for 24 hours if you were given a medicine to help you relax (sedative). Do not drive or use heavy machinery while taking prescription pain medicine. General instructions  Take over-the-counter and prescription medicines only as told by your doctor. Return to your normal activities as told. Ask what activities are safe for you. Do not use any products that have nicotine or tobacco in them. This includes cigarettes and e-cigarettes. If you need help quitting, ask your doctor. Keep all follow-up visits as told by your doctor. This is important. It is very important if you had a tissue sample (biopsy) taken. Get help right away if: You have shortness of breath that gets worse. You get light-headed. You feel like you are going to pass out (faint). You have chest pain. You cough up: More than a little blood. More blood than before. Summary Do not eat or drink anything (not even water) for 2 hours after your test, or until your numbing medicine wears off. Do not use cigarettes. Do not use e-cigarettes. Get help right away if you have chest pain.  Please call our office for any questions or concerns.  [phone].  This information is not intended to replace advice given to you by your health care provider. Make sure you discuss any questions you have with your health care  provider. Document Released: 11/13/2008 Document Revised: 12/29/2016 Document Reviewed: 02/04/2016 Elsevier Patient Education  2020 [employer].

## 2020-07-22 NOTE — Op Note (Signed)
Video Bronchoscopy Procedure Note  Date of Operation: 07/22/2020  Pre-op Diagnosis: RUL pneumonia, cough  Post-op Diagnosis: Same  Surgeon: Baltazar Apo  Assistants: none  Anesthesia: General  Operation: Flexible video fiberoptic bronchoscopy with BAL and biopsies.  Estimated Blood Loss: Minimal  Complications: none noted  Indications and History: [name] is 87 y.o. with history of nonresolving cough, right upper lobe infiltrate on chest x-ray and then CT chest.  Recommendation was to perform video fiberoptic bronchoscopy with lavage and biopsies. The risks, benefits, complications, treatment options and expected outcomes were discussed with the patient.  The possibilities of pneumothorax, pneumonia, reaction to medication, pulmonary aspiration, perforation of a viscus, bleeding, failure to diagnose a condition and creating a complication requiring transfusion or operation were discussed with the patient who freely signed the consent.    Description of Procedure: The patient was seen in the Preoperative Area, was examined and was deemed appropriate to proceed.  The patient was taken to [facility] endoscopy room 2, identified as JAHRON HUNSINGER and the procedure verified as Flexible Video Fiberoptic Bronchoscopy.  A Time Out was held and the above information confirmed.   General anesthesia was initiated the video fiberoptic bronchoscope was introduced via the ET tube and a general inspection was performed which showed normal trachea, normal main carina. The R sided airways were inspected and showed some proximal collapsibility and irregularity but no overt endobronchial lesions.  There was some scant white mucus that was easily suctioned.  The segmental bronchi showed normal RUL, BI, RML and RLL distal airways. The L side was then inspected. The LLL, Lingular and LUL airways were normal.   Under fluoroscopic guidance transbronchial brushings and transbronchial biopsies were  performed in the right upper lobe to be sent for cytology and pathology.  A bronchoalveolar lavage with 90 cc normal saline instilled and approximately 30 cc returned was sent for bacterial, AFB, fungal cultures, cell count and cytology.  The patient tolerated the procedure well. The bronchoscope was removed. There were no obvious complications.  Post procedure chest x-ray is pending  Samples: 1.  Transbronchial brushings from right upper lobe 2.  Transbronchial biopsies from the right upper lobe 3.  Bronchoalveolar lavage from the right upper lobe  Plans:  We will review the cytology, pathology and microbiology results with the patient when they become available.  Outpatient followup will be with Dr Halford Chessman.    Baltazar Apo, MD, PhD 07/22/2020, 1:28 PM [facility] [phone] or if no answer [phone]

## 2020-07-22 NOTE — Anesthesia Procedure Notes (Signed)
Procedure Name: Intubation Date/Time: 07/22/2020 1:11 PM Performed by: Gean Maidens, CRNA Pre-anesthesia Checklist: Patient identified, Emergency Drugs available, Suction available, Patient being monitored and Timeout performed Patient Re-evaluated:Patient Re-evaluated prior to induction Oxygen Delivery Method: Circle system utilized Preoxygenation: Pre-oxygenation with 100% oxygen Induction Type: IV induction Ventilation: Mask ventilation without difficulty Laryngoscope Size: Mac and 4 Grade View: Grade II Tube type: Oral Tube size: 8.5 mm Number of attempts: 1 Airway Equipment and Method: Stylet Placement Confirmation: ETT inserted through vocal cords under direct vision, positive ETCO2 and breath sounds checked- equal and bilateral Secured at: 23 cm Tube secured with: Tape Dental Injury: Teeth and Oropharynx as per pre-operative assessment

## 2020-07-22 NOTE — Transfer of Care (Signed)
Immediate Anesthesia Transfer of Care Note  Patient: DEIVI HUCKINS  Procedure(s) Performed: VIDEO BRONCHOSCOPY WITH FLUORO, BAL AND BIOPSY (Right)  Patient Location: PACU  Anesthesia Type:General  Level of Consciousness: sedated, patient cooperative and responds to stimulation  Airway & Oxygen Therapy: Patient Spontanous Breathing and Patient connected to face mask oxygen  Post-op Assessment: Report given to RN and Post -op Vital signs reviewed and stable  Post vital signs: Reviewed and stable  Last Vitals:  Vitals Value Taken Time  BP    Temp    Pulse 70 07/22/20 1338  Resp 23 07/22/20 1338  SpO2 100 % 07/22/20 1338  Vitals shown include unvalidated device data.  Last Pain:  Vitals:   07/22/20 1158  TempSrc: Oral  PainSc: 0-No pain         Complications: No notable events documented.

## 2020-07-22 NOTE — Anesthesia Postprocedure Evaluation (Signed)
Anesthesia Post Note  Patient: [name]  Procedure(s) Performed: VIDEO BRONCHOSCOPY WITH FLUORO, BAL AND BIOPSY (Right) BRONCHIAL BIOPSIES BRONCHIAL BRUSHINGS BRONCHIAL WASHINGS     Patient location during evaluation: Endoscopy Anesthesia Type: General Level of consciousness: awake Pain management: pain level controlled Vital Signs Assessment: post-procedure vital signs reviewed and stable Respiratory status: spontaneous breathing, nonlabored ventilation, respiratory function stable and patient connected to nasal cannula oxygen Cardiovascular status: blood pressure returned to baseline and stable Postop Assessment: no apparent nausea or vomiting Anesthetic complications: no   No notable events documented.  Last Vitals:  Vitals:   07/22/20 1350 07/22/20 1419  BP: 112/66 111/63  Pulse: 75 72  Resp: 19 (!) 24  Temp:    SpO2: 100% 94%    Last Pain:  Vitals:   07/22/20 1419  TempSrc:   PainSc: 0-No pain                 Riddhi Grether P Vaniyah Lansky

## 2020-07-23 ENCOUNTER — Ambulatory Visit: Payer: Medicare Other | Admitting: Vascular Surgery

## 2020-07-23 LAB — CYTOLOGY - NON PAP

## 2020-07-23 LAB — SURGICAL PATHOLOGY

## 2020-07-27 NOTE — Telephone Encounter (Signed)
Bronch performed on 6/23. Will close encounter.

## 2020-07-27 NOTE — Telephone Encounter (Signed)
Please let him know preliminary results from bronchoscopy done on 07/22/20 were negative for cancer.  Still waiting on culture results.  Will call him again if results come back positive before his follow up appointment in July.

## 2020-07-28 NOTE — Telephone Encounter (Signed)
Called and spoke with patient to let him know of bronch results from Dr. Halford Chessman. He expressed understanding. Nothing further needed at this time.

## 2020-07-29 DIAGNOSIS — L97511 Non-pressure chronic ulcer of other part of right foot limited to breakdown of skin: Secondary | ICD-10-CM | POA: Diagnosis not present

## 2020-07-29 DIAGNOSIS — I5042 Chronic combined systolic (congestive) and diastolic (congestive) heart failure: Secondary | ICD-10-CM | POA: Diagnosis not present

## 2020-07-29 DIAGNOSIS — E785 Hyperlipidemia, unspecified: Secondary | ICD-10-CM | POA: Diagnosis not present

## 2020-07-29 DIAGNOSIS — E11621 Type 2 diabetes mellitus with foot ulcer: Secondary | ICD-10-CM | POA: Diagnosis not present

## 2020-08-06 ENCOUNTER — Ambulatory Visit (INDEPENDENT_AMBULATORY_CARE_PROVIDER_SITE_OTHER): Payer: Medicare Other

## 2020-08-06 DIAGNOSIS — I472 Ventricular tachycardia, unspecified: Secondary | ICD-10-CM

## 2020-08-08 LAB — CUP PACEART REMOTE DEVICE CHECK
Battery Remaining Longevity: 16 mo
Battery Remaining Longevity: 16 mo
Battery Voltage: 2.89 V
Battery Voltage: 2.89 V
Brady Statistic AP VP Percent: 40.42 %
Brady Statistic AP VS Percent: 1.03 %
Brady Statistic AS VP Percent: 41.7 %
Brady Statistic AS VS Percent: 16.85 %
Brady Statistic RA Percent Paced: 39.34 %
Brady Statistic RA Percent Paced: 3.35 %
Brady Statistic RV Percent Paced: 82.28 %
Brady Statistic RV Percent Paced: 76.2 %
Date Time Interrogation Session: 20220708094934
Date Time Interrogation Session: 20220708223626
HighPow Impedance: 45 Ohm
HighPow Impedance: 66 Ohm
HighPow Impedance: 42 Ohm
HighPow Impedance: 59 Ohm
Implantable Lead Implant Date: 20040309
Implantable Lead Implant Date: 20040309
Implantable Lead Implant Date: 20040309
Implantable Lead Implant Date: 20040309
Implantable Lead Location: 753859
Implantable Lead Location: 753860
Implantable Lead Location: 753859
Implantable Lead Location: 753860
Implantable Lead Model: 158
Implantable Lead Model: 5076
Implantable Lead Model: 158
Implantable Lead Model: 5076
Implantable Lead Serial Number: 115061
Implantable Lead Serial Number: 115061
Implantable Pulse Generator Implant Date: 20161007
Implantable Pulse Generator Implant Date: 20161007
Lead Channel Impedance Value: 4047 Ohm
Lead Channel Impedance Value: 4047 Ohm
Lead Channel Impedance Value: 4047 Ohm
Lead Channel Impedance Value: 4047 Ohm
Lead Channel Impedance Value: 4047 Ohm
Lead Channel Impedance Value: 4047 Ohm
Lead Channel Impedance Value: 4047 Ohm
Lead Channel Impedance Value: 4047 Ohm
Lead Channel Impedance Value: 4047 Ohm
Lead Channel Impedance Value: 4047 Ohm
Lead Channel Impedance Value: 418 Ohm
Lead Channel Impedance Value: 513 Ohm
Lead Channel Impedance Value: 513 Ohm
Lead Channel Impedance Value: 399 Ohm
Lead Channel Impedance Value: 4047 Ohm
Lead Channel Impedance Value: 4047 Ohm
Lead Channel Impedance Value: 4047 Ohm
Lead Channel Impedance Value: 4047 Ohm
Lead Channel Impedance Value: 4047 Ohm
Lead Channel Impedance Value: 4047 Ohm
Lead Channel Impedance Value: 4047 Ohm
Lead Channel Impedance Value: 4047 Ohm
Lead Channel Impedance Value: 4047 Ohm
Lead Channel Impedance Value: 4047 Ohm
Lead Channel Impedance Value: 456 Ohm
Lead Channel Impedance Value: 456 Ohm
Lead Channel Pacing Threshold Amplitude: 0.5 V
Lead Channel Pacing Threshold Amplitude: 0.75 V
Lead Channel Pacing Threshold Amplitude: 0.5 V
Lead Channel Pacing Threshold Amplitude: 0.75 V
Lead Channel Pacing Threshold Pulse Width: 0.4 ms
Lead Channel Pacing Threshold Pulse Width: 0.4 ms
Lead Channel Pacing Threshold Pulse Width: 0.4 ms
Lead Channel Pacing Threshold Pulse Width: 0.4 ms
Lead Channel Sensing Intrinsic Amplitude: 1.125 mV
Lead Channel Sensing Intrinsic Amplitude: 1.125 mV
Lead Channel Sensing Intrinsic Amplitude: 6.75 mV
Lead Channel Sensing Intrinsic Amplitude: 6.75 mV
Lead Channel Sensing Intrinsic Amplitude: 1.125 mV
Lead Channel Sensing Intrinsic Amplitude: 1.125 mV
Lead Channel Sensing Intrinsic Amplitude: 6.75 mV
Lead Channel Sensing Intrinsic Amplitude: 6.75 mV
Lead Channel Setting Pacing Amplitude: 2 V
Lead Channel Setting Pacing Amplitude: 2.5 V
Lead Channel Setting Pacing Amplitude: 2 V
Lead Channel Setting Pacing Amplitude: 2.5 V
Lead Channel Setting Pacing Pulse Width: 0.4 ms
Lead Channel Setting Pacing Pulse Width: 0.4 ms
Lead Channel Setting Sensing Sensitivity: 0.3 mV
Lead Channel Setting Sensing Sensitivity: 0.3 mV

## 2020-08-09 NOTE — Progress Notes (Unsigned)
ELCHONON MAXSON (Key: [id number])  Your information has been submitted to [place]. [employer] [place] will review the request and notify you of the determination decision directly, typically within 3 business days of your submission and once all necessary information is received.  You will also receive your request decision electronically. To check for an update later, open the request again from your dashboard.  If [employer] [place] has not responded within the specified timeframe or if you have any questions about your PA submission, contact [place] directly at [facility]) [phone] or ([place]) [phone].

## 2020-08-10 ENCOUNTER — Encounter: Payer: Self-pay | Admitting: Pulmonary Disease

## 2020-08-10 ENCOUNTER — Ambulatory Visit (INDEPENDENT_AMBULATORY_CARE_PROVIDER_SITE_OTHER): Payer: Medicare Other | Admitting: Pulmonary Disease

## 2020-08-10 ENCOUNTER — Ambulatory Visit (INDEPENDENT_AMBULATORY_CARE_PROVIDER_SITE_OTHER): Payer: Medicare Other

## 2020-08-10 ENCOUNTER — Other Ambulatory Visit: Payer: Self-pay

## 2020-08-10 VITALS — BP 110/80 | HR 79 | Temp 98.1°F | Ht 73.0 in | Wt 193.0 lb

## 2020-08-10 DIAGNOSIS — Z8701 Personal history of pneumonia (recurrent): Secondary | ICD-10-CM

## 2020-08-10 DIAGNOSIS — I700 Atherosclerosis of aorta: Secondary | ICD-10-CM | POA: Diagnosis not present

## 2020-08-10 DIAGNOSIS — I2510 Atherosclerotic heart disease of native coronary artery without angina pectoris: Secondary | ICD-10-CM

## 2020-08-10 MED ORDER — ALBUTEROL SULFATE HFA 108 (90 BASE) MCG/ACT IN AERS: 2.0000 | INHALATION_SPRAY | Freq: Four times a day (QID) | RESPIRATORY_TRACT | 5 refills | Status: DC | PRN

## 2020-08-10 NOTE — Progress Notes (Signed)
Pulmonary, Critical Care, and Sleep Medicine  Chief Complaint  Patient presents with   Follow-up    Patient wants to talk about rescue inhaler. Patient would like go over results of bronch from 07/22/20. Wife states that patient has been losing weight since the infection in his lungs.     Constitutional:  BP 110/80 (BP Location: Left Arm, Patient Position: Sitting, Cuff Size: Normal)   Pulse 79   Temp 98.1 F (36.7 C) (Oral)   Ht 6\' 1"  (1.854 m)   Wt 193 lb (87.5 kg)   SpO2 97%   BMI 25.46 kg/m   Past Medical History:  Non sustained Ventricular Tachycardia, Persistent A fib, s/p AICD, CAD s/p CABG, Migraine HA, DM, systolic CHF.  Past Surgical History:  He  has a past surgical history that includes Mitral valve replacement; Coronary artery bypass graft (2001); Cardiac defibrillator placement; Hemorroidectomy; Knee arthroscopy; Cardiac catheterization (04/03/2007); Cardiac catheterization (02/06/2006); Cardiac catheterization (05/29/2000); Cardiac catheterization (10/25/1999); Cardiac catheterization (01/06/2003); transthoracic echocardiogram (04/02/2007); left heart catheterization with coronary angiogram (N/A, 07/04/2012); Cardiac catheterization (N/A, 11/06/2014); RIGHT/LEFT HEART CATH AND CORONARY/GRAFT ANGIOGRAPHY (N/A, 01/05/2017); INTRAVASCULAR PRESSURE WIRE/FFR STUDY (N/A, 01/05/2017); Hemorrhoid surgery; Cataract extraction w/ intraocular lens  implant, bilateral; Foreign Body Removal (Right, 06/21/2017); Endarterectomy (Right, 11/07/2018); Cholecystectomy (N/A, 01/08/2019); laparoscopic appendectomy (N/A, 01/08/2019); ERCP (N/A, 01/10/2019); sphincterotomy (01/10/2019); removal of stones (01/10/2019); Cardioversion (N/A, 02/07/2019); Cardioversion (N/A, 11/24/2019); Video bronchoscopy (Right, 07/22/2020); Bronchial biopsy (07/22/2020); Bronchial brushings (07/22/2020); and Bronchial washings (07/22/2020).  Brief Summary:  [name] is a 87 y.o. male former smoker with COPD and chronic  bronchitis.      Subjective:   He is here with his wife.  He had bronchoscopy in June.  Negative for malignancy.  He still has cough with clear sputum.  Albuterol helps.  Gets fatigued easily.  Hasn't been exercising as much.  Still using stiolto.  Had a humidifier - his wife is concerned that this caused him to get pneumonia.  They have since gotten rid of the humidifier.  He was losing weight when he had pneumonia.  This has leveled off and his appetite is better.  Physical Exam:   Appearance - well kempt   ENMT - no sinus tenderness, no oral exudate, no LAN, Mallampati 3 airway, no stridor  Respiratory - equal breath sounds bilaterally, no wheezing or rales  CV - s1s2 regular rate and rhythm, no murmurs  Ext - no clubbing, no edema  Skin - no rashes  Psych - normal mood and affect     Pulmonary Tests:  Bronchoscopy 07/22/20 >> cytology negative  Chest Imaging:  CT angio chest 06/21/20 >> patchy GGO with most prominent density in Rt upper lobe  Cardiac Tests:  Echo 05/06/20 >> EF 35 to 40%, mild LVH, mod elevation in PASP, severe LA/RA dilation, mild MS, mild/mod TR, aortic root 40 mm  Social History:  He  reports that he quit smoking about 49 years ago. His smoking use included cigarettes. He started smoking about 64 years ago. He has a 32.00 pack-year smoking history. He has never used smokeless tobacco. He reports current alcohol use. He reports that he does not use drugs.  Family History:  His family history includes Cancer in his sister and another family member; Diabetes in an other family member; Emphysema in his father; Heart disease in his father, paternal grandfather, and paternal grandmother; Stroke in his mother.     Assessment/Plan:   History of pneumonia. - repeat chest xray  COPD with chronic bronchitis. - continue stiolto - prn albuterol and mucinex  Deconditioning. - after episode of pneumonia - encouraged him to maintain a regular exercise  regimem   Systolic CHF, A fib, CAD, VT. - followed by Dr. Burt Knack and Dr. Caryl Comes with [facility]  Time Spent Involved in Patient Care on Day of Examination:  33 minutes  Follow up:   Patient Instructions  Chest xray today  Can try using mucinex to help clear chest congestion  Follow up in 3 months  Medication List:   Allergies as of 08/10/2020       Reactions   Bee Venom Anaphylaxis   Latex Other (See Comments)   REDNESS AT REACTION SITE.   Metformin Diarrhea   Rivaroxaban Other (See Comments)   Headaches, blurred vision   Sotalol Other (See Comments)   "BLUE TOES"- cut his circulation off   Amiodarone    Other reaction(s): rash, doesn't sound like DRESS syndrome.   Levofloxacin    Other reaction(s): long QT syndrome, so avoid   Warfarin Sodium Other (See Comments)   REACTION: migraine headaches/vision impariment        Medication List        Accurate as of August 10, 2020 11:45 AM. If you have any questions, ask your nurse or doctor.          Aimovig 140 MG/ML Soaj Generic drug: Erenumab-aooe Inject 140 mg into the skin every 28 (twenty-eight) days.   albuterol 108 (90 Base) MCG/ACT inhaler Commonly known as: VENTOLIN HFA Inhale 2 puffs into the lungs every 6 (six) hours as needed for wheezing or shortness of breath.   atorvastatin 80 MG tablet Commonly known as: LIPITOR Take 1 tablet (80 mg total) by mouth at bedtime.   cetirizine 10 MG tablet Commonly known as: ZYRTEC Take 10 mg by mouth in the morning.   dofetilide 500 MCG capsule Commonly known as: [name] Take 1 capsule (500 mcg total) by mouth 2 (two) times daily.   Eliquis 5 MG Tabs tablet Generic drug: apixaban Take 1 tablet (5 mg total) by mouth 2 (two) times daily. Okay to restart this medication on 07/23/2020   empagliflozin 10 MG Tabs tablet Commonly known as: JARDIANCE Take 10 mg by mouth in the morning.   EpiPen 2-Pak 0.3 mg/0.3 mL Soaj injection Generic drug:  EPINEPHrine Inject 0.3 mg into the muscle daily as needed for anaphylaxis.   fluticasone 50 MCG/ACT nasal spray Commonly known as: FLONASE Place 1 spray into the nose daily as needed for rhinitis or allergies.   ibuprofen 200 MG tablet Commonly known as: ADVIL Take 400 mg by mouth every 8 (eight) hours as needed (headaches/migraine).   Magnesium 250 MG Tabs Take 250 mg by mouth in the morning.   metoprolol succinate 25 MG 24 hr tablet Commonly known as: TOPROL-XL Take 1 tablet (25 mg total) by mouth 2 (two) times daily with a meal. Take with or immediately following a meal.   multivitamin with minerals Tabs tablet Take 1 tablet by mouth at bedtime. Centrum Silver   PRESERVISION AREDS 2 PO Take 1 tablet by mouth in the morning.   sitaGLIPtin 100 MG tablet Commonly known as: JANUVIA Take 100 mg by mouth at bedtime.   Stiolto Respimat 2.5-2.5 MCG/ACT Aers Generic drug: Tiotropium Bromide-Olodaterol Inhale 2 puffs into the lungs at bedtime.   Vitamin D 125 MCG (5000 UT) Caps Take 5,000-10,000 Units by mouth See admin instructions. Take 1 capsule (5000 units) by mouth  on Tuesdays, Thursdays, Saturdays, & Sundays at bedtime. Take 2 capsules (10000 units) by mouth on Mondays, Wednesdays, & Fridays at bedtime.        Signature:  Chesley Mires, MD [place] Pager - [phone] 08/10/2020, 11:45 AM

## 2020-08-10 NOTE — Patient Instructions (Signed)
Chest xray today  Can try using mucinex to help clear chest congestion  Follow up in 3 months

## 2020-08-11 ENCOUNTER — Encounter: Payer: Self-pay | Admitting: *Deleted

## 2020-08-11 NOTE — Progress Notes (Signed)
Aimovig 70mg  PA approved until 7/1//23

## 2020-08-26 NOTE — Progress Notes (Signed)
[name] (Key: [id number]) Aimovig '70MG'$ /ML auto-injectors   Form [employer] [place] Medicare Part D Electronic Request Form (CB) Created 26 days ago Sent to Plan 17 days ago Plan Response 17 days ago Submit Clinical Questions 17 days ago Determination Favorable 17 days ago Message from Plan Effective from 08/09/2020 through 08/09/2021.

## 2020-08-27 NOTE — Progress Notes (Signed)
Remote ICD transmission.   

## 2020-09-06 DIAGNOSIS — E782 Mixed hyperlipidemia: Secondary | ICD-10-CM | POA: Diagnosis not present

## 2020-09-06 DIAGNOSIS — E1159 Type 2 diabetes mellitus with other circulatory complications: Secondary | ICD-10-CM | POA: Diagnosis not present

## 2020-09-10 DIAGNOSIS — E559 Vitamin D deficiency, unspecified: Secondary | ICD-10-CM | POA: Diagnosis not present

## 2020-09-10 DIAGNOSIS — I480 Paroxysmal atrial fibrillation: Secondary | ICD-10-CM | POA: Diagnosis not present

## 2020-09-10 DIAGNOSIS — E113393 Type 2 diabetes mellitus with moderate nonproliferative diabetic retinopathy without macular edema, bilateral: Secondary | ICD-10-CM | POA: Diagnosis not present

## 2020-09-10 DIAGNOSIS — E1122 Type 2 diabetes mellitus with diabetic chronic kidney disease: Secondary | ICD-10-CM | POA: Diagnosis not present

## 2020-09-10 DIAGNOSIS — I2510 Atherosclerotic heart disease of native coronary artery without angina pectoris: Secondary | ICD-10-CM | POA: Diagnosis not present

## 2020-09-10 DIAGNOSIS — Z7984 Long term (current) use of oral hypoglycemic drugs: Secondary | ICD-10-CM | POA: Diagnosis not present

## 2020-09-10 DIAGNOSIS — E785 Hyperlipidemia, unspecified: Secondary | ICD-10-CM | POA: Diagnosis not present

## 2020-09-10 DIAGNOSIS — N182 Chronic kidney disease, stage 2 (mild): Secondary | ICD-10-CM | POA: Diagnosis not present

## 2020-09-10 DIAGNOSIS — E1159 Type 2 diabetes mellitus with other circulatory complications: Secondary | ICD-10-CM | POA: Diagnosis not present

## 2020-09-13 DIAGNOSIS — I700 Atherosclerosis of aorta: Secondary | ICD-10-CM | POA: Diagnosis not present

## 2020-09-13 DIAGNOSIS — I480 Paroxysmal atrial fibrillation: Secondary | ICD-10-CM | POA: Diagnosis not present

## 2020-09-13 DIAGNOSIS — Z1389 Encounter for screening for other disorder: Secondary | ICD-10-CM | POA: Diagnosis not present

## 2020-09-13 DIAGNOSIS — I714 Abdominal aortic aneurysm, without rupture: Secondary | ICD-10-CM | POA: Diagnosis not present

## 2020-09-13 DIAGNOSIS — E559 Vitamin D deficiency, unspecified: Secondary | ICD-10-CM | POA: Diagnosis not present

## 2020-09-13 DIAGNOSIS — I5022 Chronic systolic (congestive) heart failure: Secondary | ICD-10-CM | POA: Diagnosis not present

## 2020-09-13 DIAGNOSIS — E1169 Type 2 diabetes mellitus with other specified complication: Secondary | ICD-10-CM | POA: Diagnosis not present

## 2020-09-13 DIAGNOSIS — Z0000 Encounter for general adult medical examination without abnormal findings: Secondary | ICD-10-CM | POA: Diagnosis not present

## 2020-09-13 DIAGNOSIS — I272 Pulmonary hypertension, unspecified: Secondary | ICD-10-CM | POA: Diagnosis not present

## 2020-09-13 DIAGNOSIS — Z1331 Encounter for screening for depression: Secondary | ICD-10-CM | POA: Diagnosis not present

## 2020-09-13 DIAGNOSIS — D6869 Other thrombophilia: Secondary | ICD-10-CM | POA: Diagnosis not present

## 2020-09-13 DIAGNOSIS — I50810 Right heart failure, unspecified: Secondary | ICD-10-CM | POA: Diagnosis not present

## 2020-09-13 DIAGNOSIS — I472 Ventricular tachycardia: Secondary | ICD-10-CM | POA: Diagnosis not present

## 2020-09-13 DIAGNOSIS — I4581 Long QT syndrome: Secondary | ICD-10-CM | POA: Diagnosis not present

## 2020-10-05 ENCOUNTER — Encounter: Payer: Self-pay | Admitting: Internal Medicine

## 2020-10-05 ENCOUNTER — Other Ambulatory Visit: Payer: Self-pay

## 2020-10-05 ENCOUNTER — Ambulatory Visit (INDEPENDENT_AMBULATORY_CARE_PROVIDER_SITE_OTHER): Payer: Medicare Other | Admitting: Internal Medicine

## 2020-10-05 VITALS — BP 100/66 | HR 84 | Ht 73.0 in | Wt 196.8 lb

## 2020-10-05 DIAGNOSIS — I472 Ventricular tachycardia: Secondary | ICD-10-CM | POA: Diagnosis not present

## 2020-10-05 DIAGNOSIS — I4819 Other persistent atrial fibrillation: Secondary | ICD-10-CM

## 2020-10-05 DIAGNOSIS — I2510 Atherosclerotic heart disease of native coronary artery without angina pectoris: Secondary | ICD-10-CM | POA: Diagnosis not present

## 2020-10-05 DIAGNOSIS — Z95810 Presence of automatic (implantable) cardiac defibrillator: Secondary | ICD-10-CM | POA: Diagnosis not present

## 2020-10-05 DIAGNOSIS — I5022 Chronic systolic (congestive) heart failure: Secondary | ICD-10-CM

## 2020-10-05 DIAGNOSIS — I4729 Other ventricular tachycardia: Secondary | ICD-10-CM

## 2020-10-05 NOTE — Progress Notes (Signed)
Patient Care Team: Sueanne Margarita, DO as PCP - General (Internal Medicine) Sherren Mocha, MD as PCP - Cardiology (Cardiology) Deboraha Sprang, MD as PCP - Electrophysiology (Cardiology)   HPI  [name] is a 87 y.o. male Seen in followup for polymorphic ventricular tachycardia for which he has an ICD implantation 2004 with generator change 2010 and  2016 as well as persistent atrial fibrillation which was quite symptomatic and prompted a visit to the A. fib clinic with the plan for anticoagulation and TEE cardioversion.  Currently maintained on Eliquis and dofetilide  Last device implanted with CRT but no LV lead was placed.  History of coronary disease with prior CABG mitral valve repair both of which were done at [facility]  PFO closure at that time.     AFib assoc with slowish Ventricular response but with dyspnea on exertion and fatigue.  He is quite aware of when he is in A. fib and when he is not.  Interval issues include 1-none resolving right upper lobe pneumonia for which he underwent bronchoscopy 6/22 cytology negative 2-expanding abdominal aortic aneurysm--repair in the past of been recommended.  Phone call note 06/21/2020 referred to repeat vascular surgery clinic evaluation for which I do not see a note   Lengthy discussion regarding his choosing not to pursue surgical relief of his AAA,  he is at peace with the potential of his dying suddenly. 3-evaluation by general cardiology, coronary disease and heart failure quiescient.  His breathing is much better since the treatment of pneumonia   Antiarrhythmics Date Reason stopped  dofetilide 9/20  ongoing   Amiodarone / Tried years ago following CABG stopped but pt does not recall adverse effects      DATE TEST EF   6/14 LHC 35-40% LIMA/ RIMA patent   2/17 Echo   30 %   12/18 LHC   LIMA-LAD and right radial graft-OM- patent proximal cx stenosis with negative FFR  11/19 Echo  35-40% LAE Severe  (69/3.0/63) Pulm HTN 50  10/20 Echo  35-40% BAE  Pulm NTH 41 mm  4/22 Echo  35-40% BAE  PA sys 45  5/22 CTA-abd  AAA 6.2 <<5.7 cm        Date Cr K Mg Hgb  9/16  0.99 4.2 2.1   12/17  1.08 4.6 2.1   5/19 1.00 4.5 2.2   10/19 1.04 4.4  13.2 (11/19)  1/21  0.9 4.3 1.9 13.4  2/22 1.04 4.5 1.9 12.2<<13.9       Device History: STJ dual chamber ICD implanted 2004 for PMVT, ICM; generator change 2010; gen change 2016 (MDT device) History of appropriate therapy: Yes History of AAD therapy: Yes Thromboembolic risk factors ( age  -2, HTN-1, TIA/CVA-2, Vasc disease -1, CHF-1) for a CHADSVASc Score of >=7   Past Medical History:  Diagnosis Date   Acute on chronic combined systolic (congestive) and diastolic (congestive) heart failure (HCC)    AICD (automatic cardioverter/defibrillator) present    Medtronicn PPM/ICD   Carotid stenosis, right    s/p endarterectomy 11/07/18   Chronic combined systolic (congestive) and diastolic (congestive) heart failure ([place])    Coronary artery disease    [facility]   Diabetes mellitus    Dysrhythmia    Endocarditis, valve unspecified    Mitral   Headache    hx migraines 6-7 x mo   History of migraine headaches    History of seasonal allergies    ICD (implantable cardiac defibrillator)  in place    Optic nerve hemorrhage, 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  We will monitor this closely and look for changes over the ensuing 6 months.  Repeat visit here in 6 months   PAF (paroxysmal atrial fibrillation) (HCC)    NO LAA occlusion at the time of surgery  [name] 2018   Pleural effusion    PVC's (premature ventricular contractions)    Ventricular tachycardia, polymorphic ([place])    status post ICD implantation    Past Surgical History:  Procedure Laterality Date   BRONCHIAL BIOPSY  07/22/2020   Procedure: BRONCHIAL BIOPSIES;  Surgeon:  Collene Gobble, MD;  Location: [facility] ENDOSCOPY;  Service: Cardiopulmonary;;   BRONCHIAL BRUSHINGS  07/22/2020   Procedure: BRONCHIAL BRUSHINGS;  Surgeon: Collene Gobble, MD;  Location: [facility];  Service: Cardiopulmonary;;   BRONCHIAL WASHINGS  07/22/2020   Procedure: BRONCHIAL WASHINGS;  Surgeon: Collene Gobble, MD;  Location: [facility];  Service: Cardiopulmonary;;   CARDIAC CATHETERIZATION  04/03/2007   EF 40%   CARDIAC CATHETERIZATION  02/06/2006   EF 45%. ANTERIOR HYPOKINESIS   CARDIAC CATHETERIZATION  05/29/2000   EF 50%. MILD ANTERIOR HYPOKINESIS   CARDIAC CATHETERIZATION  10/25/1999   EF 50%. SEVERE MITRAL REGURGITATION   CARDIAC CATHETERIZATION  01/06/2003   EF 50%   CARDIAC DEFIBRILLATOR PLACEMENT     CARDIOVERSION N/A 02/07/2019   Procedure: CARDIOVERSION;  Surgeon: Fay Records, MD;  Location[facility] END[facility]COPY;  Service: Cardiovascular;  Laterality: N/A;   CARDIOVERSION N/A 11/24/2019   Procedure: CARDIOVERSION;  Surgeon: Josue Hector, MD;  Location: [facility] ENDOSCOPY;  Service: Cardiovascular;  Laterality: N/A;   CATARACT EXTRACTION W/ INTRAOCULAR LENS  IMPLANT, BILATERAL     CHOLECYSTECTOMY N/A 01/08/2019   Procedure: LAPAROSCOPIC CHOLECYSTECTOMY WITH INTRAOPERATIVE CHOLANGIOGRAM;  Surgeon: Donnie Mesa, MD;  Location: [place];  Service: General;  Laterality: N/A;   CORONARY ARTERY BYPASS GRAFT  2001   2 VESSEL CABG AND MITRAL VALVE REPAIR. HE HAD A LIMA GRAFT TO THE LAD AND RADIAL ARTERY  GRAFT TO THE OBTUSE MARGINAL  OF LEFT CIRCUMFLEX   ENDARTERECTOMY Right 11/07/2018   Procedure: ENDARTERECTOMY CAROTID RIGHT;  Surgeon: Waynetta Sandy, MD;  Location: [place];  Service: Vascular;  Laterality: Right;   EP IMPLANTABLE DEVICE N/A 11/06/2014   Procedure: ICD Generator Changeout;  Surgeon: Deboraha Sprang, MD;  Location: [place] CV LAB;  Service: Cardiovascular;  Laterality: N/A;   ERCP N/A 01/10/2019   Procedure: ENDOSCOPIC RETROGRADE CHOLANGIOPANCREATOGRAPHY (ERCP);   Surgeon: Clarene Essex, MD;  Location: [place];  Service: Endoscopy;  Laterality: N/A;   FOREIGN BODY REMOVAL Right 06/21/2017   Procedure: FOREIGN BODY REMOVAL ADULT RIGHT RING FINGER;  Surgeon: Leanora Cover, MD;  Location: [place];  Service: Orthopedics;  Laterality: Right;   HEMORRHOID SURGERY     HEMORROIDECTOMY     INTRAVASCULAR PRESSURE WIRE/FFR STUDY N/A 01/05/2017   Procedure: INTRAVASCULAR PRESSURE WIRE/FFR STUDY;  Surgeon: Sherren Mocha, MD;  Location: [place] CV LAB;  Service: Cardiovascular;  Laterality: N/A;   KNEE ARTHROSCOPY     RIGHT KNEE   LAPAROSCOPIC APPENDECTOMY N/A 01/08/2019   Procedure: APPENDECTOMY LAPAROSCOPIC;  Surgeon: Donnie Mesa, MD;  Location: [place];  Service: General;  Laterality: N/A;   LEFT HEART CATHETERIZATION WITH CORONARY ANGIOGRAM N/A 07/04/2012   Procedure: LEFT HEART CATHETERIZATION WITH CORONARY ANGIOGRAM;  Surgeon: Sherren Mocha, MD;  [facility] CATH  LAB;  Service: Cardiovascular;  Laterality: N/A;   MITRAL VALVE REPLACEMENT     No LAA occlusion at the time of surgery  Karie Soda report 2018   REMOVAL OF STONES  01/10/2019   Procedure: REMOVAL OF STONES;  Surgeon: Clarene Essex, MD;  Location: [place];  Service: Endoscopy;;   RIGHT/LEFT HEART CATH AND CORONARY/GRAFT ANGIOGRAPHY N/A 01/05/2017   Procedure: RIGHT/LEFT HEART CATH AND CORONARY/GRAFT ANGIOGRAPHY;  Surgeon: Sherren Mocha, MD;  Location: [place] CV LAB;  Service: Cardiovascular;  Laterality: N/A;   SPHINCTEROTOMY  01/10/2019   Procedure: SPHINCTEROTOMY;  Surgeon: Clarene Essex, MD;  Location: [place];  Service: Endoscopy;;   TRANSTHORACIC ECHOCARDIOGRAM  04/02/2007   EF 35%   VIDEO BRONCHOSCOPY Right 07/22/2020   Procedure: VIDEO BRONCHOSCOPY WITH FLUORO, BAL AND BIOPSY;  Surgeon: Collene Gobble, MD;  Location: [facility] ENDOSCOPY;  Service: Cardiopulmonary;  Laterality: Right;  CONSCIOUS SEDATION OR MAC    Current Outpatient Medications  Medication Sig Dispense Refill    albuterol (VENTOLIN HFA) 108 (90 Base) MCG/ACT inhaler Inhale 2 puffs into the lungs every 6 (six) hours as needed for wheezing or shortness of breath. 8 g 5   apixaban (ELIQUIS) 5 MG TABS tablet Take 1 tablet (5 mg total) by mouth 2 (two) times daily. Okay to restart this medication on 07/23/2020     atorvastatin (LIPITOR) 80 MG tablet Take 1 tablet (80 mg total) by mouth at bedtime.     cetirizine (ZYRTEC) 10 MG tablet Take 10 mg by mouth in the morning.     dofetilide ([name]) 500 MCG capsule Take 1 capsule (500 mcg total) by mouth 2 (two) times daily. 180 capsule 2   empagliflozin (JARDIANCE) 10 MG TABS tablet Take 10 mg by mouth in the morning.     EPIPEN 2-PAK 0.3 MG/0.3ML SOAJ injection Inject 0.3 mg into the muscle daily as needed for anaphylaxis.      Erenumab-aooe (AIMOVIG) 140 MG/ML SOAJ Inject 140 mg into the skin every 28 (twenty-eight) days. 1.12 mL 5   fluticasone (FLONASE) 50 MCG/ACT nasal spray Place 1 spray into the nose daily as needed for rhinitis or allergies.     ibuprofen (ADVIL) 200 MG tablet Take 400 mg by mouth every 8 (eight) hours as needed (headaches/migraine).     Magnesium 250 MG TABS Take 250 mg by mouth in the morning.     metoprolol succinate (TOPROL-XL) 25 MG 24 hr tablet Take 1 tablet (25 mg total) by mouth 2 (two) times daily with a meal. Take with or immediately following a meal. 180 tablet 3   Multiple Vitamin (MULTIVITAMIN WITH MINERALS) TABS tablet Take 1 tablet by mouth at bedtime. Centrum Silver     Multiple Vitamins-Minerals (PRESERVISION AREDS 2 PO) Take 1 tablet by mouth in the morning.     nateglinide (STARLIX) 60 MG tablet Take 1 tablet twice a day before meals for diabetes     sitaGLIPtin (JANUVIA) 100 MG tablet Take 100 mg by mouth at bedtime.     Tiotropium Bromide-Olodaterol (STIOLTO RESPIMAT) 2.5-2.5 MCG/ACT AERS Inhale 2 puffs into the lungs at bedtime.     Cholecalciferol (VITAMIN D) 125 MCG (5000 UT) CAPS Take 5,000-10,000 Units by mouth See  admin instructions. Take 1 capsule (5000 units) by mouth on Tuesdays, Thursdays, Saturdays, & Sundays at bedtime. Take 2 capsules (10000 units) by mouth on Mondays, Wednesdays, & Fridays at bedtime. (Patient not taking: Reported on 10/05/2020)     No current facility-administered medications for this visit.  Allergies  Allergen Reactions   Bee Venom Anaphylaxis   Latex Other (See Comments)    REDNESS AT REACTION SITE.   Metformin Diarrhea   Rivaroxaban Other (See Comments)    Headaches, blurred vision   Sotalol Other (See Comments)    "BLUE TOES"- cut his circulation off   Amiodarone     Other reaction(s): rash, doesn't sound like DRESS syndrome.   Levofloxacin     Other reaction(s): long QT syndrome, so avoid   Warfarin Sodium Other (See Comments)    REACTION: migraine headaches/vision impariment    Review of Systems negative except from HPI and PMH  Physical Exam BP 100/66   Pulse 84   Ht _0  (1.854 m)   Wt 196 lb 12.8 oz (89.3 kg)   SpO2 93%   BMI 25.96 kg/m   Well developed and well nourished in no acute distress HENT normal Neck supple with JVP-flat Clear Device pocket well healed; without hematoma or erythema.  There is no tethering  Regular rate and rhythm, no murmur Abd-soft with active BS No Clubbing cyanosis  edema Skin-warm and dry A & Oriented  Grossly normal sensory and motor function  ECG AV pacing at 84 J TC about 400 ms  Assessment and  Plan   Ischemic heart disease with prior bypass ejection 35-40%  Congestive heart failure-chronic-systolic  Implantable defibrillator-Medtronic dual chamber with LV port plugged.   PVCs//Ventricular tachycardia nonsustained     Atrial fibrillation persistent/recurrent  Dofetilide therapy  COPD-reactive airways disease     Dyspnea is much improved.  Turned out to be largely related to pneumonia.  Volume status is stable.  He is euvolemic.  We will continue him on Jardiance   Continues with  frequent intermittent atrial fibrillation which he thinks temporally worsened with his vaccine.  Continue dofetilide 500 mg twice daily.  Surveillance laboratories and ECG are within range  No angina.  Continue atorvastatin 80 and Eliquis 5 twice daily as well as metoprolol 25 twice daily  Device function is normal.  Atrial fibrillation burden about 50%

## 2020-10-05 NOTE — Patient Instructions (Signed)

## 2020-10-27 ENCOUNTER — Other Ambulatory Visit: Payer: Self-pay

## 2020-10-27 ENCOUNTER — Ambulatory Visit (INDEPENDENT_AMBULATORY_CARE_PROVIDER_SITE_OTHER): Payer: Medicare Other | Admitting: Pulmonary Disease

## 2020-10-27 ENCOUNTER — Encounter: Payer: Self-pay | Admitting: Pulmonary Disease

## 2020-10-27 VITALS — BP 108/60 | HR 84 | Temp 98.3°F | Ht 73.0 in | Wt 197.4 lb

## 2020-10-27 DIAGNOSIS — I2510 Atherosclerotic heart disease of native coronary artery without angina pectoris: Secondary | ICD-10-CM | POA: Diagnosis not present

## 2020-10-27 DIAGNOSIS — I6529 Occlusion and stenosis of unspecified carotid artery: Secondary | ICD-10-CM | POA: Diagnosis not present

## 2020-10-27 DIAGNOSIS — J449 Chronic obstructive pulmonary disease, unspecified: Secondary | ICD-10-CM

## 2020-10-27 DIAGNOSIS — I480 Paroxysmal atrial fibrillation: Secondary | ICD-10-CM | POA: Diagnosis not present

## 2020-10-27 DIAGNOSIS — Z23 Encounter for immunization: Secondary | ICD-10-CM

## 2020-10-27 DIAGNOSIS — Z95810 Presence of automatic (implantable) cardiac defibrillator: Secondary | ICD-10-CM | POA: Diagnosis not present

## 2020-10-27 DIAGNOSIS — I255 Ischemic cardiomyopathy: Secondary | ICD-10-CM | POA: Diagnosis not present

## 2020-10-27 DIAGNOSIS — I10 Essential (primary) hypertension: Secondary | ICD-10-CM | POA: Diagnosis not present

## 2020-10-27 DIAGNOSIS — E118 Type 2 diabetes mellitus with unspecified complications: Secondary | ICD-10-CM | POA: Diagnosis not present

## 2020-10-27 DIAGNOSIS — E871 Hypo-osmolality and hyponatremia: Secondary | ICD-10-CM | POA: Diagnosis not present

## 2020-10-27 NOTE — Patient Instructions (Signed)
High dose flu shot today  Follow up in 4 months 

## 2020-10-27 NOTE — Progress Notes (Signed)
[name]  Chief Complaint  Patient presents with   Follow-up    Sob-better, dry cough     Past Surgical History:  He  has a past surgical history that includes Mitral valve replacement; Coronary artery bypass graft (2001); Cardiac defibrillator placement; Hemorroidectomy; Knee arthroscopy; Cardiac catheterization (04/03/2007); Cardiac catheterization (02/06/2006); Cardiac catheterization (05/29/2000); Cardiac catheterization (10/25/1999); Cardiac catheterization (01/06/2003); transthoracic echocardiogram (04/02/2007); left heart catheterization with coronary angiogram (N/A, 07/04/2012); Cardiac catheterization (N/A, 11/06/2014); RIGHT/LEFT HEART CATH AND CORONARY/GRAFT ANGIOGRAPHY (N/A, 01/05/2017); INTRAVASCULAR PRESSURE WIRE/FFR STUDY (N/A, 01/05/2017); Hemorrhoid surgery; Cataract extraction w/ intraocular lens  implant, bilateral; Foreign Body Removal (Right, 06/21/2017); Endarterectomy (Right, 11/07/2018); Cholecystectomy (N/A, 01/08/2019); laparoscopic appendectomy (N/A, 01/08/2019); ERCP (N/A, 01/10/2019); sphincterotomy (01/10/2019); removal of stones (01/10/2019); Cardioversion (N/A, 02/07/2019); Cardioversion (N/A, 11/24/2019); Video bronchoscopy (Right, 07/22/2020); Bronchial biopsy (07/22/2020); Bronchial brushings (07/22/2020); and Bronchial washings (07/22/2020).  Past Medical History:  Non sustained Ventricular Tachycardia, Persistent A fib, s/p AICD, CAD s/p CABG, Migraine HA, DM, systolic CHF.  Constitutional:  BP 108/60 (BP Location: Left Arm, Cuff Size: Normal)   Pulse 84   Temp 98.3 F (36.8 C) (Temporal)   Ht 6\' 1"  (1.854 m)   Wt 197 lb 6.4 oz (89.5 kg)   SpO2 99%   BMI 26.04 kg/m   Brief Summary:  [name] is a 87 y.o. male former smoker with COPD and chronic bronchitis.      Subjective:   He had chest xray 08/11/20.  This showed resolution of previous pneumonia.  He feels his breathing is better.  He is not able to walk longer  distances w/o having to rest.  He is sleeping okay.  Has occasional cough.  No wheeze, chest pain, fever, hemoptysis, or leg swelling.  Stiolto helps.  Gets brief coughing episode after using albuterol, but otherwise feels this helps.  His weight is up about 4 lbs since last visit, and he plans to maintain his weight now at this level.  His wife recently made the comment to him that this is the best he has looked in a couple of years.  Physical Exam:   Appearance - well kempt   ENMT - no sinus tenderness, no oral exudate, no LAN, Mallampati 3 airway, no stridor  Respiratory - equal breath sounds bilaterally, no wheezing or rales  CV - s1s2 regular rate and rhythm, no murmurs  Ext - no clubbing, no edema  Skin - no rashes  Psych - normal mood and affect      Pulmonary Tests:  Bronchoscopy 07/22/20 >> cytology negative  Chest Imaging:  CT angio chest 06/21/20 >> patchy GGO with most prominent density in Rt upper lobe  Cardiac Tests:  Echo 05/06/20 >> EF 35 to 40%, mild LVH, mod elevation in PASP, severe LA/RA dilation, mild MS, mild/mod TR, aortic root 40 mm  Social History:  He  reports that he quit smoking about 49 years ago. His smoking use included cigarettes. He started smoking about 64 years ago. He has a 32.00 pack-year smoking history. He has never used smokeless tobacco. He reports current alcohol use. He reports that he does not use drugs.  Family History:  His family history includes Cancer in his sister and another family member; Diabetes in an other family member; Emphysema in his father; Heart disease in his father, paternal grandfather, and paternal grandmother; Stroke in his mother.     Assessment/Plan:   History of pneumonia from March/April 2022. - resolved  COPD  with chronic bronchitis. - continue stiolto - if he continues to improve by next visit, then could consider transitioning off LABA and just continue LAMA - don't think there is utility in him doing  PFT at this time; consider testing if his symptoms progress - prn albuterol - high dose flu shot today  Deconditioning. - after episode of pneumonia - much improved since last visit - continue regular exercise regimen   Systolic CHF, A fib, CAD, VT. - followed by Dr. Burt Knack and Dr. Caryl Comes with [facility]  Time Spent Involved in Patient Care on Day of Examination:  31 minutes  Follow up:   Patient Instructions  High dose flu shot today  Follow up in 4 months  Medication List:   Allergies as of 10/27/2020       Reactions   Bee Venom Anaphylaxis   Latex Other (See Comments)   REDNESS AT REACTION SITE.   Metformin Diarrhea   Rivaroxaban Other (See Comments)   Headaches, blurred vision   Sotalol Other (See Comments)   "BLUE TOES"- cut his circulation off   Amiodarone    Other reaction(s): rash, doesn't sound like DRESS syndrome.   Levofloxacin    Other reaction(s): long QT syndrome, so avoid   Warfarin Sodium Other (See Comments)   REACTION: migraine headaches/vision impariment        Medication List        Accurate as of October 27, 2020 10:40 AM. If you have any questions, ask your nurse or doctor.          Aimovig 140 MG/ML Soaj Generic drug: Erenumab-aooe Inject 140 mg into the skin every 28 (twenty-eight) days.   albuterol 108 (90 Base) MCG/ACT inhaler Commonly known as: VENTOLIN HFA Inhale 2 puffs into the lungs every 6 (six) hours as needed for wheezing or shortness of breath.   atorvastatin 80 MG tablet Commonly known as: LIPITOR Take 1 tablet (80 mg total) by mouth at bedtime.   cetirizine 10 MG tablet Commonly known as: ZYRTEC Take 10 mg by mouth in the morning.   dofetilide 500 MCG capsule Commonly known as: [name] Take 1 capsule (500 mcg total) by mouth 2 (two) times daily.   Eliquis 5 MG Tabs tablet Generic drug: apixaban Take 1 tablet (5 mg total) by mouth 2 (two) times daily. Okay to restart this medication on 07/23/2020    empagliflozin 10 MG Tabs tablet Commonly known as: JARDIANCE Take 10 mg by mouth in the morning.   EpiPen 2-Pak 0.3 mg/0.3 mL Soaj injection Generic drug: EPINEPHrine Inject 0.3 mg into the muscle daily as needed for anaphylaxis.   fluticasone 50 MCG/ACT nasal spray Commonly known as: FLONASE Place 1 spray into the nose daily as needed for rhinitis or allergies.   ibuprofen 200 MG tablet Commonly known as: ADVIL Take 400 mg by mouth every 8 (eight) hours as needed (headaches/migraine).   Magnesium 250 MG Tabs Take 250 mg by mouth in the morning.   metoprolol succinate 25 MG 24 hr tablet Commonly known as: TOPROL-XL Take 1 tablet (25 mg total) by mouth 2 (two) times daily with a meal. Take with or immediately following a meal.   multivitamin with minerals Tabs tablet Take 1 tablet by mouth at bedtime. Centrum Silver   nateglinide 60 MG tablet Commonly known as: STARLIX Take 1 tablet twice a day before meals for diabetes   PRESERVISION AREDS 2 PO Take 1 tablet by mouth in the morning.   sitaGLIPtin 100 MG  tablet Commonly known as: JANUVIA Take 100 mg by mouth at bedtime.   Stiolto Respimat 2.5-2.5 MCG/ACT Aers Generic drug: Tiotropium Bromide-Olodaterol Inhale 2 puffs into the lungs at bedtime.   Vitamin D 125 MCG (5000 UT) Caps Take 5,000-10,000 Units by mouth See admin instructions. Take 1 capsule (5000 units) by mouth on Tuesdays, Thursdays, Saturdays, & Sundays at bedtime. Take 2 capsules (10000 units) by mouth on Mondays, Wednesdays, & Fridays at bedtime.        Signature:  Chesley Mires, MD [place] Pager - [phone] 10/27/2020, 10:40 AM

## 2020-10-29 DIAGNOSIS — E785 Hyperlipidemia, unspecified: Secondary | ICD-10-CM | POA: Diagnosis not present

## 2020-10-29 DIAGNOSIS — I5042 Chronic combined systolic (congestive) and diastolic (congestive) heart failure: Secondary | ICD-10-CM | POA: Diagnosis not present

## 2020-10-29 DIAGNOSIS — L97511 Non-pressure chronic ulcer of other part of right foot limited to breakdown of skin: Secondary | ICD-10-CM | POA: Diagnosis not present

## 2020-10-29 DIAGNOSIS — E11621 Type 2 diabetes mellitus with foot ulcer: Secondary | ICD-10-CM | POA: Diagnosis not present

## 2020-11-05 ENCOUNTER — Ambulatory Visit (INDEPENDENT_AMBULATORY_CARE_PROVIDER_SITE_OTHER): Payer: Medicare Other

## 2020-11-05 DIAGNOSIS — I4819 Other persistent atrial fibrillation: Secondary | ICD-10-CM

## 2020-11-08 ENCOUNTER — Ambulatory Visit (INDEPENDENT_AMBULATORY_CARE_PROVIDER_SITE_OTHER): Payer: Medicare Other | Admitting: Cardiovascular Disease

## 2020-11-08 ENCOUNTER — Other Ambulatory Visit: Payer: Self-pay

## 2020-11-08 ENCOUNTER — Encounter: Payer: Self-pay | Admitting: Cardiovascular Disease

## 2020-11-08 VITALS — BP 96/66 | HR 73 | Ht 73.0 in | Wt 195.6 lb

## 2020-11-08 DIAGNOSIS — I472 Ventricular tachycardia, unspecified: Secondary | ICD-10-CM | POA: Diagnosis not present

## 2020-11-08 DIAGNOSIS — I7143 Infrarenal abdominal aortic aneurysm, without rupture: Secondary | ICD-10-CM

## 2020-11-08 DIAGNOSIS — I6521 Occlusion and stenosis of right carotid artery: Secondary | ICD-10-CM

## 2020-11-08 DIAGNOSIS — I2510 Atherosclerotic heart disease of native coronary artery without angina pectoris: Secondary | ICD-10-CM | POA: Diagnosis not present

## 2020-11-08 DIAGNOSIS — I5022 Chronic systolic (congestive) heart failure: Secondary | ICD-10-CM | POA: Diagnosis not present

## 2020-11-08 DIAGNOSIS — I4819 Other persistent atrial fibrillation: Secondary | ICD-10-CM | POA: Diagnosis not present

## 2020-11-08 NOTE — Patient Instructions (Signed)
Medication Instructions:  Your physician recommends that you continue on your current medications as directed. Please refer to the Current Medication list given to you today.  *If you need a refill on your cardiac medications before your next appointment, please call your pharmacy*   Lab Work: none If you have labs (blood work) drawn today and your tests are completely normal, you will receive your results only by: [place] (if you have MyChart) OR A paper copy in the mail If you have any lab test that is abnormal or we need to change your treatment, we will call you to review the results.   Testing/Procedures: none   Follow-Up: At [facility], you and your health needs are our priority.  As part of our continuing mission to provide you with exceptional heart care, we have created designated Provider Care Teams.  These Care Teams include your primary Cardiologist (physician) and Advanced Practice Providers (APPs -  Physician Assistants and Nurse Practitioners) who all work together to provide you with the care you need, when you need it.  We recommend signing up for the patient portal called "MyChart".  Sign up information is provided on this After Visit Summary.  MyChart is used to connect with patients for Virtual Visits (Telemedicine).  Patients are able to view lab/test results, encounter notes, upcoming appointments, etc.  Non-urgent messages can be sent to your provider as well.   To learn more about what you can do with MyChart, go to [email or web address].    Your next appointment:   6 month(s)  The format for your next appointment:   In Person  Provider:   You will see one of the following Advanced Practice Providers on your designated Care Team:   Richardson Dopp, PA-C Robbie Lis, PA-C  Then, Sherren Mocha, MD will plan to see you again in 1 year(s).   Other Instructions

## 2020-11-08 NOTE — Progress Notes (Signed)
Cardiology Office Note:    Date:  11/16/2020   ID:  [name], DOB 01-08-1932, MRN [id number]  PCP:  Sueanne Margarita, DO   [facility] Providers Cardiologist:  Sherren Mocha, MD Electrophysiologist:  [name], MD     Referring MD: Sueanne Margarita, DO   Chief Complaint  Patient presents with   Coronary Artery Disease     History of Present Illness:    [name] is a 87 y.o. male with a hx of: Coronary artery disease  Mitral valve disease S/p CABG, MV repair  Cath 12/18: oLAD 3, pLAD 50, oD1 50; oLCx 60, OM2 95; RCA irregs; R Radial-OM2 and L-LAD patent >> Med rx  Hypertension  Diabetes mellitus  Persistent atrial fibrillation  Dofetilide Rx, Apixaban Rx  (HFrEF) heart failure with reduced ejection fraction  Echocardiogram 10/2018: EF 35-40 Echocardiogram 4/22: EF 35-40  Pulmonary hypertension  Echocardiogram 4/22: RVSP 45.5  Ventricular tachycardia  S/p ICD  Carotid artery disease S/p R CEA  Abdominal aortic aneurysm  CT 12/2018 5.7 cm (followed by vasc surg)  The patient is here alone today.  He has some questions about his oral hypoglycemic therapy and its relation to his congestive heart failure.  He was taken off of Actos and his hemoglobin A1c has modestly increased.  He reports no recent change in his symptoms of fatigue.  He has had no recent chest pain or significant shortness of breath.  He denies lightheadedness or syncope.  Past Medical History:  Diagnosis Date   Acute on chronic combined systolic (congestive) and diastolic (congestive) heart failure (HCC)    AICD (automatic cardioverter/defibrillator) present    [employer]n PPM/ICD   Carotid stenosis, right    s/p endarterectomy 11/07/18   Chronic combined systolic (congestive) and diastolic (congestive) heart failure (HCC)    Coronary artery disease    [facility]   Diabetes mellitus    Dysrhythmia    Endocarditis, valve unspecified    Mitral   Headache     hx migraines 6-7 x mo   History of migraine headaches    History of seasonal allergies    ICD (implantable cardiac defibrillator) in place    Optic nerve hemorrhage, 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  We will monitor this closely and look for changes over the ensuing 6 months.  Repeat visit here in 6 months   PAF (paroxysmal atrial fibrillation) (HCC)    NO LAA occlusion at the time of surgery  [name] 2018   Pleural effusion    PVC's (premature ventricular contractions)    Ventricular tachycardia, polymorphic    status post ICD implantation    Past Surgical History:  Procedure Laterality Date   BRONCHIAL BIOPSY  07/22/2020   Procedure: BRONCHIAL BIOPSIES;  Surgeon: Collene Gobble, MD;  Location: [facility];  Service: Cardiopulmonary;;   BRONCHIAL BRUSHINGS  07/22/2020   Procedure: BRONCHIAL BRUSHINGS;  Surgeon: Collene Gobble, MD;  Location: [facility];  Service: Cardiopulmonary;;   BRONCHIAL WASHINGS  07/22/2020   Procedure: BRONCHIAL WASHINGS;  Surgeon: Collene Gobble, MD;  Location: [facility];  Service: Cardiopulmonary;;   CARDIAC CATHETERIZATION  04/03/2007   EF 40%   CARDIAC CATHETERIZATION  02/06/2006   EF 45%. ANTERIOR HYPOKINESIS   CARDIAC CATHETERIZATION  05/29/2000   EF 50%. MILD ANTERIOR HYPOKINESIS   CARDIAC CATHETERIZATION  10/25/1999   EF 50%. SEVERE  MITRAL REGURGITATION   CARDIAC CATHETERIZATION  01/06/2003   EF 50%   CARDIAC DEFIBRILLATOR PLACEMENT     CARDIOVERSION N/A 02/07/2019   Procedure: CARDIOVERSION;  Surgeon: Fay Records, MD;  Location: [facility] ENDOSCOPY;  Service: Cardiovascular;  Laterality: N/A;   CARDIOVERSION N/A 11/24/2019   Procedure: CARDIOVERSION;  Surgeon: Josue Hector, MD;  Location: [facility] ENDOSCOPY;  Service: Cardiovascular;  Laterality: N/A;   CATARACT EXTRACTION W/ INTRAOCULAR LENS  IMPLANT, BILATERAL     CHOLECYSTECTOMY N/A 01/08/2019   Procedure:  LAPAROSCOPIC CHOLECYSTECTOMY WITH INTRAOPERATIVE CHOLANGIOGRAM;  Surgeon: Donnie Mesa, MD;  Location: [place];  Service: General;  Laterality: N/A;   CORONARY ARTERY BYPASS GRAFT  2001   2 VESSEL CABG AND MITRAL VALVE REPAIR. HE HAD A LIMA GRAFT TO THE LAD AND RADIAL ARTERY  GRAFT TO THE OBTUSE MARGINAL  OF LEFT CIRCUMFLEX   ENDARTERECTOMY Right 11/07/2018   Procedure: ENDARTERECTOMY CAROTID RIGHT;  Surgeon: Waynetta Sandy, MD;  Location: [place];  Service: Vascular;  Laterality: Right;   EP IMPLANTABLE DEVICE N/A 11/06/2014   Procedure: ICD Generator Changeout;  Surgeon: Deboraha Sprang, MD;  Location: [place] [facility];  Service: Cardiovascular;  Laterality: N/A;   ERCP N/A 01/10/2019   Procedure: ENDOSCOPIC RETROGRADE CHOLANGIOPANCREATOGRAPHY (ERCP);  Surgeon: Clarene Essex, MD;  Location: [place];  Service: Endoscopy;  Laterality: N/A;   FOREIGN BODY REMOVAL Right 06/21/2017   Procedure: FOREIGN BODY REMOVAL ADULT RIGHT RING FINGER;  Surgeon: Leanora Cover, MD;  Location: [place];  Service: Orthopedics;  Laterality: Right;   HEMORRHOID SURGERY     HEMORROIDECTOMY     INTRAVASCULAR PRESSURE WIRE/FFR STUDY N/A 01/05/2017   Procedure: INTRAVASCULAR PRESSURE WIRE/FFR STUDY;  Surgeon: Sherren Mocha, MD;  Location: [place] [facility];  Service: Cardiovascular;  Laterality: N/A;   KNEE ARTHROSCOPY     RIGHT KNEE   LAPAROSCOPIC APPENDECTOMY N/A 01/08/2019   Procedure: APPENDECTOMY LAPAROSCOPIC;  Surgeon: Donnie Mesa, MD;  Location: [place];  Service: General;  Laterality: N/A;   LEFT HEART CATHETERIZATION WITH CORONARY ANGIOGRAM N/A 07/04/2012   Procedure: LEFT HEART CATHETERIZATION WITH CORONARY ANGIOGRAM;  Surgeon: Sherren Mocha, MD;  Location[facility] CATH LAB;  Service: Cardiovascular;  Laterality: N/A;   MITRAL VALVE REPLACEMENT     No LAA occlusion at the time of surgery  Karie Soda report 2018   REMOVAL OF STONES  01/10/2019   Procedure: REMOVAL OF STONES;  Surgeon: Clarene Essex, MD;   Location: [place];  Service: Endoscopy;;   RIGHT/LEFT HEART CATH AND CORONARY/GRAFT ANGIOGRAPHY N/A 01/05/2017   Procedure: RIGHT/LEFT HEART CATH AND CORONARY/GRAFT ANGIOGRAPHY;  Surgeon: Sherren Mocha, MD;  Location: [place] [facility];  Service: Cardiovascular;  Laterality: N/A;   SPHINCTEROTOMY  01/10/2019   Procedure: SPHINCTEROTOMY;  Surgeon: Clarene Essex, MD;  Location: [place];  Service: Endoscopy;;   TRANSTHORACIC ECHOCARDIOGRAM  04/02/2007   EF 35%   VIDEO BRONCHOSCOPY Right 07/22/2020   Procedure: VIDEO BRONCHOSCOPY WITH FLUORO, BAL AND BIOPSY;  Surgeon: Collene Gobble, MD;  Location: [facility];  Service: Cardiopulmonary;  Laterality: Right;  CONSCIOUS SEDATION OR MAC    Current Medications: Current Meds  Medication Sig   albuterol (VENTOLIN HFA) 108 (90 Base) MCG/ACT inhaler Inhale 2 puffs into the lungs every 6 (six) hours as needed for wheezing or shortness of breath.   apixaban (ELIQUIS) 5 MG TABS tablet Take 1 tablet (5 mg total) by mouth 2 (two) times daily. Okay to restart this medication on 07/23/2020   atorvastatin (LIPITOR)  80 MG tablet Take 1 tablet (80 mg total) by mouth at bedtime.   cetirizine (ZYRTEC) 10 MG tablet Take 10 mg by mouth in the morning.   Cholecalciferol (VITAMIN D) 125 MCG (5000 UT) CAPS Take 5,000-10,000 Units by mouth See admin instructions. Take 1 capsule (5000 units) by mouth on Tuesdays, Thursdays, Saturdays, & Sundays at bedtime. Take 2 capsules (10000 units) by mouth on Mondays, Wednesdays, & Fridays at bedtime. (Patient not taking: Reported on 11/16/2020)   diphenhydrAMINE (BENADRYL) 25 MG tablet Take 25 mg by mouth at bedtime.   dofetilide ([name]) 500 MCG capsule Take 1 capsule (500 mcg total) by mouth 2 (two) times daily.   empagliflozin (JARDIANCE) 10 MG TABS tablet Take 10 mg by mouth in the morning. (Patient not taking: Reported on 11/16/2020)   empagliflozin (JARDIANCE) 25 MG TABS tablet Take 25 mg by mouth in the morning.    EPIPEN 2-PAK 0.3 MG/0.3ML SOAJ injection Inject 0.3 mg into the muscle daily as needed for anaphylaxis.    Erenumab-aooe (AIMOVIG) 140 MG/ML SOAJ Inject 140 mg into the skin every 28 (twenty-eight) days.   fluticasone (FLONASE) 50 MCG/ACT nasal spray Place 1 spray into the nose daily as needed for rhinitis or allergies.   ibuprofen (ADVIL) 200 MG tablet Take 400 mg by mouth every 8 (eight) hours as needed (headaches/migraine).   Magnesium 250 MG TABS Take 250 mg by mouth in the morning.   metoprolol succinate (TOPROL-XL) 25 MG 24 hr tablet Take 1 tablet (25 mg total) by mouth 2 (two) times daily with a meal. Take with or immediately following a meal.   Multiple Vitamin (MULTIVITAMIN WITH MINERALS) TABS tablet Take 1 tablet by mouth at bedtime. Centrum Silver   Multiple Vitamins-Minerals (PRESERVISION AREDS 2 PO) Take 1 tablet by mouth in the morning.   nateglinide (STARLIX) 60 MG tablet Take 1 tablet twice a day before meals for diabetes   Olodaterol HCl (STRIVERDI RESPIMAT) 2.5 MCG/ACT AERS Inhale 2 puffs into the lungs at bedtime as needed.   sitaGLIPtin (JANUVIA) 100 MG tablet Take 100 mg by mouth at bedtime.   Tiotropium Bromide-Olodaterol (STIOLTO RESPIMAT) 2.5-2.5 MCG/ACT AERS Inhale 2 puffs into the lungs at bedtime.     Allergies:   Bee venom, Latex, Metformin, Rivaroxaban, Sotalol, Amiodarone, Levofloxacin, and Warfarin sodium   Social History   Socioeconomic History   Marital status: Married    Spouse name: Mardene Celeste   Number of children: 2   Years of education: Not on file   Highest education level: Associate degree: academic program  Occupational History   Occupation: retired  Tobacco Use   Smoking status: Former    Packs/day: 2.00    Years: 16.00    Pack years: 32.00    Types: Cigarettes    Start date: 02/23/1956    Quit date: 01/31/1971    Years since quitting: 49.8   Smokeless tobacco: Never  Vaping Use   Vaping Use: Never used  Substance and Sexual Activity    Alcohol use: Yes    Alcohol/week: 0.0 standard drinks    Comment: 1-2 a week   Drug use: No   Sexual activity: Yes  Other Topics Concern   Not on file  Social History Narrative   Patient is right-handed. He lives with his wife ina 2 story home. He drinks one cup of coffee and a diet coke a day.    Social Determinants of Health   Financial Resource Strain: Not on file  Food Insecurity: Not on  file  Transportation Needs: Not on file  Physical Activity: Not on file  Stress: Not on file  Social Connections: Not on file     Family History: The patient's family history includes Cancer in his sister and another family member; Diabetes in an other family member; Emphysema in his father; Heart disease in his father, paternal grandfather, and paternal grandmother; Stroke in his mother.  ROS:   Please see the history of present illness.    All other systems reviewed and are negative.  EKGs/Labs/Other Studies Reviewed:    The following studies were reviewed today: 2D echocardiogram 05/06/2020: 1. Global hypokinesis worse in the mid-apical septal and apical  myocardium. Left ventricular ejection fraction, by estimation, is 35 to  40%. The left ventricle has moderately decreased function. The left  ventricle demonstrates global hypokinesis. There  is mild left ventricular hypertrophy of the posterior segment. Left  ventricular diastolic parameters are indeterminate. Elevated left  ventricular end-diastolic pressure.   2. Right ventricular systolic function is mildly reduced. The right  ventricular size is normal. There is moderately elevated pulmonary artery  systolic pressure.   3. Left atrial size was severely dilated.   4. Right atrial size was severely dilated.   5. Mean mitral valve gradient unchanged from 10/2018. The mitral valve is  degenerative. Trivial mitral valve regurgitation. Mild mitral stenosis.  The mean mitral valve gradient is 3.0 mmHg.   6. Tricuspid valve  regurgitation is mild to moderate.   7. The aortic valve is normal in structure. There is mild calcification  of the aortic valve. There is mild thickening of the aortic valve. Aortic  valve regurgitation is not visualized. No aortic stenosis is present.   8. Aortic dilatation noted. There is mild dilatation of the aortic root,  measuring 40 mm. There is mild dilatation of the ascending aorta,  measuring 37 mm.   9. The inferior vena cava is normal in size with greater than 50%  respiratory variability, suggesting right atrial pressure of 3 mmHg.    Recent Labs: 04/06/2020: NT-Pro BNP 2,843 07/14/2020: ALT 20; BUN 18; Creatinine, Ser 0.93; Hemoglobin 14.1; Platelets 174; Potassium 4.8; Sodium 132  Recent Lipid Panel    Component Value Date/Time   CHOL 110 11/05/2018 0256   TRIG 90 11/05/2018 0256   HDL 48 11/05/2018 0256   CHOLHDL 2.3 11/05/2018 0256   VLDL 18 11/05/2018 0256   LDLCALC 44 11/05/2018 0256     Risk Assessment/Calculations:    CHA2DS2-VASc Score = 6   This indicates a 9.7% annual risk of stroke. The patient's score is based upon: CHF History: 1 HTN History: 1 Diabetes History: 1 Stroke History: 0 Vascular Disease History: 1 Age Score: 2 Gender Score: 0          Physical Exam:    VS:  BP 96/66   Pulse 73   Ht _0  (1.854 m)   Wt 195 lb 9.6 oz (88.7 kg)   SpO2 97%   BMI 25.81 kg/m     Wt Readings from Last 3 Encounters:  11/08/20 195 lb 9.6 oz (88.7 kg)  10/27/20 197 lb 6.4 oz (89.5 kg)  10/05/20 196 lb 12.8 oz (89.3 kg)     GEN:  Well nourished, well developed in no acute distress HEENT: Normal NECK: No JVD; No carotid bruits LYMPHATICS: No lymphadenopathy CARDIAC: RRR, no murmurs, rubs, gallops RESPIRATORY:  Clear to auscultation without rales, wheezing or rhonchi  ABDOMEN: Soft, non-tender, non-distended MUSCULOSKELETAL:  No edema; No  deformity  SKIN: Warm and dry NEUROLOGIC:  Alert and oriented x 3 PSYCHIATRIC:  Normal affect    ASSESSMENT:    1. Persistent atrial fibrillation ([place])   2. Chronic systolic congestive heart failure ([place])   3. Ventricular tachycardia   4. Coronary artery disease involving native coronary artery of native heart without angina pectoris   5. Infrarenal abdominal aortic aneurysm (AAA) without rupture    PLAN:    In order of problems listed above:  Followed by Dr. Caryl Comes, managed with dofetilide and anticoagulated with apixaban. The patient appears clinically stable.  He was dyspneic earlier this year and it turns out this was largely due to pneumonia.  He seems to be doing relatively well on his current medical program.  We discussed the use of Actos and since he has not really experienced problems with volume overload, I would be comfortable if he needs to use this in the future.  He has managed by his endocrinologist. Stable Not currently experiencing angina.  Continue with current medications. I reviewed the patient's most recent CT angiogram from May of this year.  The largest dimension of the aneurysm was 6.2 cm.  I talked to the patient about getting back in touch with Dr. Donzetta Matters and considering endovascular repair of his aneurysm.  He is not sure that he wants to go forward with any treatment.  We discussed the natural history of abdominal aortic aneurysm.  It is notable that he has had a 0.5 cm increase in his aneurysm over a period of 18 months.        Medication Adjustments/Labs and Tests Ordered: Current medicines are reviewed at length with the patient today.  Concerns regarding medicines are outlined above.  No orders of the defined types were placed in this encounter.  No orders of the defined types were placed in this encounter.   Patient Instructions  Medication Instructions:  Your physician recommends that you continue on your current medications as directed. Please refer to the Current Medication list given to you today.  *If you need a refill on your cardiac  medications before your next appointment, please call your pharmacy*   Lab Work: none If you have labs (blood work) drawn today and your tests are completely normal, you will receive your results only by: [place] (if you have MyChart) OR A paper copy in the mail If you have any lab test that is abnormal or we need to change your treatment, we will call you to review the results.   Testing/Procedures: none   Follow-Up: At [facility], you and your health needs are our priority.  As part of our continuing mission to provide you with exceptional heart care, we have created designated Provider Care Teams.  These Care Teams include your primary Cardiologist (physician) and Advanced Practice Providers (APPs -  Physician Assistants and Nurse Practitioners) who all work together to provide you with the care you need, when you need it.  We recommend signing up for the patient portal called "MyChart".  Sign up information is provided on this After Visit Summary.  MyChart is used to connect with patients for Virtual Visits (Telemedicine).  Patients are able to view lab/test results, encounter notes, upcoming appointments, etc.  Non-urgent messages can be sent to your provider as well.   To learn more about what you can do with MyChart, go to [email or web address].    Your next appointment:   6 month(s)  The format for your next appointment:  In Person  Provider:   You will see one of the following Advanced Practice Providers on your designated Care Team:   Richardson Dopp, PA-C Robbie Lis, PA-C  Then, Sherren Mocha, MD will plan to see you again in 1 year(s).   Other Instructions     Signed, Sherren Mocha, MD  11/16/2020 6:20 PM    [facility]

## 2020-11-09 LAB — CUP PACEART REMOTE DEVICE CHECK
Battery Remaining Longevity: 11 mo
Battery Voltage: 2.87 V
Brady Statistic AP VP Percent: 45.29 %
Brady Statistic AP VS Percent: 0.6 %
Brady Statistic AS VP Percent: 45.81 %
Brady Statistic AS VS Percent: 8.3 %
Brady Statistic RA Percent Paced: 42.5 %
Brady Statistic RV Percent Paced: 91.2 %
Date Time Interrogation Session: 20221007022604
HighPow Impedance: 41 Ohm
HighPow Impedance: 60 Ohm
Implantable Lead Implant Date: 20040309
Implantable Lead Implant Date: 20040309
Implantable Lead Location: 753859
Implantable Lead Location: 753860
Implantable Lead Model: 158
Implantable Lead Model: 5076
Implantable Lead Serial Number: 115061
Implantable Pulse Generator Implant Date: 20161007
Lead Channel Impedance Value: 399 Ohm
Lead Channel Impedance Value: 399 Ohm
Lead Channel Impedance Value: 399 Ohm
Lead Channel Impedance Value: 4047 Ohm
Lead Channel Impedance Value: 4047 Ohm
Lead Channel Impedance Value: 4047 Ohm
Lead Channel Impedance Value: 4047 Ohm
Lead Channel Impedance Value: 4047 Ohm
Lead Channel Impedance Value: 4047 Ohm
Lead Channel Impedance Value: 4047 Ohm
Lead Channel Impedance Value: 4047 Ohm
Lead Channel Impedance Value: 4047 Ohm
Lead Channel Impedance Value: 4047 Ohm
Lead Channel Pacing Threshold Amplitude: 0.5 V
Lead Channel Pacing Threshold Amplitude: 0.75 V
Lead Channel Pacing Threshold Pulse Width: 0.4 ms
Lead Channel Pacing Threshold Pulse Width: 0.4 ms
Lead Channel Sensing Intrinsic Amplitude: 1.625 mV
Lead Channel Sensing Intrinsic Amplitude: 1.625 mV
Lead Channel Sensing Intrinsic Amplitude: 6.875 mV
Lead Channel Sensing Intrinsic Amplitude: 6.875 mV
Lead Channel Setting Pacing Amplitude: 2 V
Lead Channel Setting Pacing Amplitude: 2.5 V
Lead Channel Setting Pacing Pulse Width: 0.4 ms
Lead Channel Setting Sensing Sensitivity: 0.3 mV
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
## 2020-11-15 NOTE — Progress Notes (Signed)
Remote ICD transmission.   

## 2020-11-16 ENCOUNTER — Other Ambulatory Visit: Payer: Self-pay

## 2020-11-16 ENCOUNTER — Encounter: Payer: Self-pay | Admitting: Neurology

## 2020-11-16 ENCOUNTER — Telehealth (INDEPENDENT_AMBULATORY_CARE_PROVIDER_SITE_OTHER): Payer: Medicare Other | Admitting: Neurology

## 2020-11-16 DIAGNOSIS — G43109 Migraine with aura, not intractable, without status migrainosus: Secondary | ICD-10-CM

## 2020-11-16 DIAGNOSIS — I6521 Occlusion and stenosis of right carotid artery: Secondary | ICD-10-CM | POA: Diagnosis not present

## 2020-11-16 DIAGNOSIS — G453 Amaurosis fugax: Secondary | ICD-10-CM | POA: Diagnosis not present

## 2020-11-23 NOTE — Progress Notes (Signed)
Letter received from [facility]. Pt approved for Aimovig til 01/29/2022

## 2020-11-30 ENCOUNTER — Ambulatory Visit (INDEPENDENT_AMBULATORY_CARE_PROVIDER_SITE_OTHER): Payer: Medicare Other | Admitting: Ophthalmology

## 2020-11-30 ENCOUNTER — Other Ambulatory Visit: Payer: Self-pay

## 2020-11-30 ENCOUNTER — Encounter (INDEPENDENT_AMBULATORY_CARE_PROVIDER_SITE_OTHER): Payer: Self-pay | Admitting: Ophthalmology

## 2020-11-30 DIAGNOSIS — E113393 Type 2 diabetes mellitus with moderate nonproliferative diabetic retinopathy without macular edema, bilateral: Secondary | ICD-10-CM | POA: Diagnosis not present

## 2020-11-30 NOTE — Assessment & Plan Note (Signed)
Moderate NPDR

## 2020-11-30 NOTE — Progress Notes (Signed)
11/30/2020     CHIEF COMPLAINT Patient presents for  Chief Complaint  Patient presents with   Retina Follow Up    6 Month Diabetic F/U OU  Pt denies noticeable changes to [facility] OU since last visit. Pt denies ocular pain, flashes of light, or floaters OU.   A1c: 6.4, 6 months ago LBS: 108 yesterday      HISTORY OF PRESENT ILLNESS: [name] is a 87 y.o. male who presents to the clinic today for:   HPI     Retina Follow Up   Patient presents with  Diabetic Retinopathy.  In both eyes.  This started 9 months ago.  Severity is mild.  Duration of 9 months.  Since onset it is stable. Additional comments: 6 Month Diabetic F/U OU  Pt denies noticeable changes to [place] OU since last visit. Pt denies ocular pain, flashes of light, or floaters OU.   A1c: 6.4, 6 months ago LBS: 108 yesterday        Comments   9 mos fu OU oct fp. Patient states vision is stable and unchanged since last visit. Denies any new floaters or FOL.       Last edited by Laurin Coder on 11/30/2020  1:02 PM.      Referring physician: Sueanne Margarita, DO [address] [place],  [place] [address]  HISTORICAL INFORMATION:   Selected notes from the MEDICAL RECORD NUMBER    Lab Results  Component Value Date   HGBA1C 6.8 (H) 11/04/2018     CURRENT MEDICATIONS: No current outpatient medications on file. (Ophthalmic Drugs)   No current facility-administered medications for this visit. (Ophthalmic Drugs)   Current Outpatient Medications (Other)  Medication Sig   albuterol (VENTOLIN HFA) 108 (90 Base) MCG/ACT inhaler Inhale 2 puffs into the lungs every 6 (six) hours as needed for wheezing or shortness of breath.   apixaban (ELIQUIS) 5 MG TABS tablet Take 1 tablet (5 mg total) by mouth 2 (two) times daily. Okay to restart this medication on 07/23/2020   atorvastatin (LIPITOR) 80 MG tablet Take 1 tablet (80 mg total) by mouth at bedtime.   cetirizine (ZYRTEC) 10 MG tablet Take 10 mg by mouth in the  morning.   Cholecalciferol (VITAMIN D) 125 MCG (5000 UT) CAPS Take 5,000-10,000 Units by mouth See admin instructions. Take 1 capsule (5000 units) by mouth on Tuesdays, Thursdays, Saturdays, & Sundays at bedtime. Take 2 capsules (10000 units) by mouth on Mondays, Wednesdays, & Fridays at bedtime. (Patient not taking: Reported on 11/16/2020)   diphenhydrAMINE (BENADRYL) 25 MG tablet Take 25 mg by mouth at bedtime.   dofetilide ([name]) 500 MCG capsule Take 1 capsule (500 mcg total) by mouth 2 (two) times daily.   empagliflozin (JARDIANCE) 10 MG TABS tablet Take 10 mg by mouth in the morning. (Patient not taking: Reported on 11/16/2020)   empagliflozin (JARDIANCE) 25 MG TABS tablet Take 25 mg by mouth in the morning.   EPIPEN 2-PAK 0.3 MG/0.3ML SOAJ injection Inject 0.3 mg into the muscle daily as needed for anaphylaxis.    Erenumab-aooe (AIMOVIG) 140 MG/ML SOAJ Inject 140 mg into the skin every 28 (twenty-eight) days.   fluticasone (FLONASE) 50 MCG/ACT nasal spray Place 1 spray into the nose daily as needed for rhinitis or allergies.   ibuprofen (ADVIL) 200 MG tablet Take 400 mg by mouth every 8 (eight) hours as needed (headaches/migraine).   Magnesium 250 MG TABS Take 250 mg by mouth in the morning.  metoprolol succinate (TOPROL-XL) 25 MG 24 hr tablet Take 1 tablet (25 mg total) by mouth 2 (two) times daily with a meal. Take with or immediately following a meal.   Multiple Vitamin (MULTIVITAMIN WITH MINERALS) TABS tablet Take 1 tablet by mouth at bedtime. Centrum Silver   Multiple Vitamins-Minerals (PRESERVISION AREDS 2 PO) Take 1 tablet by mouth in the morning.   nateglinide (STARLIX) 60 MG tablet Take 1 tablet twice a day before meals for diabetes   Olodaterol HCl (STRIVERDI RESPIMAT) 2.5 MCG/ACT AERS Inhale 2 puffs into the lungs at bedtime as needed.   sitaGLIPtin (JANUVIA) 100 MG tablet Take 100 mg by mouth at bedtime.   Tiotropium Bromide-Olodaterol (STIOLTO RESPIMAT) 2.5-2.5 MCG/ACT AERS  Inhale 2 puffs into the lungs at bedtime.   No current facility-administered medications for this visit. (Other)      REVIEW OF SYSTEMS:    ALLERGIES Allergies  Allergen Reactions   Bee Venom Anaphylaxis   Latex Other (See Comments)    REDNESS AT REACTION SITE.   Metformin Diarrhea   Rivaroxaban Other (See Comments)    Headaches, blurred vision   Sotalol Other (See Comments)    "BLUE TOES"- cut his circulation off   Amiodarone     Other reaction(s): rash, doesn't sound like DRESS syndrome.   Levofloxacin     Other reaction(s): long QT syndrome, so avoid   Warfarin Sodium Other (See Comments)    REACTION: migraine headaches/vision impariment    PAST MEDICAL HISTORY Past Medical History:  Diagnosis Date   Acute on chronic combined systolic (congestive) and diastolic (congestive) heart failure (HCC)    AICD (automatic cardioverter/defibrillator) present    [employer]n PPM/ICD   Carotid stenosis, right    s/p endarterectomy 11/07/18   Chronic combined systolic (congestive) and diastolic (congestive) heart failure (HCC)    Coronary artery disease    [facility]  [facility]   Diabetes mellitus    Dysrhythmia    Endocarditis, valve unspecified    Mitral   Headache    hx migraines 6-7 x mo   History of migraine headaches    History of seasonal allergies    ICD (implantable cardiac defibrillator) in place    Optic nerve hemorrhage, 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  We will monitor this closely and look for changes over the ensuing 6 months.  Repeat visit here in 6 months   PAF (paroxysmal atrial fibrillation) (HCC)    NO LAA occlusion at the time of surgery  [name] 2018   Pleural effusion    PVC's (premature ventricular contractions)    Ventricular tachycardia, polymorphic    status post ICD implantation   Past Surgical History:  Procedure Laterality Date    BRONCHIAL BIOPSY  07/22/2020   Procedure: BRONCHIAL BIOPSIES;  Surgeon: Collene Gobble, MD;  Location: [facility];  Service: Cardiopulmonary;;   BRONCHIAL BRUSHINGS  07/22/2020   Procedure: BRONCHIAL BRUSHINGS;  Surgeon: Collene Gobble, MD;  Location: [facility];  Service: Cardiopulmonary;;   BRONCHIAL WASHINGS  07/22/2020   Procedure: BRONCHIAL WASHINGS;  Surgeon: Collene Gobble, MD;  Location: [facility];  Service: Cardiopulmonary;;   CARDIAC CATHETERIZATION  04/03/2007   EF 40%   CARDIAC CATHETERIZATION  02/06/2006   EF 45%. ANTERIOR HYPOKINESIS   CARDIAC CATHETERIZATION  05/29/2000   EF 50%. MILD ANTERIOR HYPOKINESIS   CARDIAC CATHETERIZATION  10/25/1999   EF 50%. SEVERE  MITRAL REGURGITATION   CARDIAC CATHETERIZATION  01/06/2003   EF 50%   CARDIAC DEFIBRILLATOR PLACEMENT     CARDIOVERSION N/A 02/07/2019   Procedure: CARDIOVERSION;  Surgeon: Fay Records, MD;  Location: [facility] ENDOSCOPY;  Service: Cardiovascular;  Laterality: N/A;   CARDIOVERSION N/A 11/24/2019   Procedure: CARDIOVERSION;  Surgeon: Josue Hector, MD;  Location: [facility] ENDOSCOPY;  Service: Cardiovascular;  Laterality: N/A;   CATARACT EXTRACTION W/ INTRAOCULAR LENS  IMPLANT, BILATERAL     CHOLECYSTECTOMY N/A 01/08/2019   Procedure: LAPAROSCOPIC CHOLECYSTECTOMY WITH INTRAOPERATIVE CHOLANGIOGRAM;  Surgeon: Donnie Mesa, MD;  Location: [place];  Service: General;  Laterality: N/A;   CORONARY ARTERY BYPASS GRAFT  2001   2 VESSEL CABG AND MITRAL VALVE REPAIR. HE HAD A LIMA GRAFT TO THE LAD AND RADIAL ARTERY  GRAFT TO THE OBTUSE MARGINAL  OF LEFT CIRCUMFLEX   ENDARTERECTOMY Right 11/07/2018   Procedure: ENDARTERECTOMY CAROTID RIGHT;  Surgeon: Waynetta Sandy, MD;  Location: [place];  Service: Vascular;  Laterality: Right;   EP IMPLANTABLE DEVICE N/A 11/06/2014   Procedure: ICD Generator Changeout;  Surgeon: Deboraha Sprang, MD;  Location: [place] [facility];  Service: Cardiovascular;  Laterality: N/A;   ERCP N/A 01/10/2019    Procedure: ENDOSCOPIC RETROGRADE CHOLANGIOPANCREATOGRAPHY (ERCP);  Surgeon: Clarene Essex, MD;  Location: [place];  Service: Endoscopy;  Laterality: N/A;   FOREIGN BODY REMOVAL Right 06/21/2017   Procedure: FOREIGN BODY REMOVAL ADULT RIGHT RING FINGER;  Surgeon: Leanora Cover, MD;  Location: [place];  Service: Orthopedics;  Laterality: Right;   HEMORRHOID SURGERY     HEMORROIDECTOMY     INTRAVASCULAR PRESSURE WIRE/FFR STUDY N/A 01/05/2017   Procedure: INTRAVASCULAR PRESSURE WIRE/FFR STUDY;  Surgeon: Sherren Mocha, MD;  Location: [place] [facility];  Service: Cardiovascular;  Laterality: N/A;   KNEE ARTHROSCOPY     RIGHT KNEE   LAPAROSCOPIC APPENDECTOMY N/A 01/08/2019   Procedure: APPENDECTOMY LAPAROSCOPIC;  Surgeon: Donnie Mesa, MD;  Location: [place];  Service: General;  Laterality: N/A;   LEFT HEART CATHETERIZATION WITH CORONARY ANGIOGRAM N/A 07/04/2012   Procedure: LEFT HEART CATHETERIZATION WITH CORONARY ANGIOGRAM;  Surgeon: Sherren Mocha, MD;  Location: [facility];  Service: Cardiovascular;  Laterality: N/A;   MITRAL VALVE REPLACEMENT     No LAA occlusion at the time of surgery  Karie Soda report 2018   REMOVAL OF STONES  01/10/2019   Procedure: REMOVAL OF STONES;  Surgeon: Clarene Essex, MD;  Location: [place];  Service: Endoscopy;;   RIGHT/LEFT HEART CATH AND CORONARY/GRAFT ANGIOGRAPHY N/A 01/05/2017   Procedure: RIGHT/LEFT HEART CATH AND CORONARY/GRAFT ANGIOGRAPHY;  Surgeon: Sherren Mocha, MD;  Location: [place] [facility];  Service: Cardiovascular;  Laterality: N/A;   SPHINCTEROTOMY  01/10/2019   Procedure: SPHINCTEROTOMY;  Surgeon: Clarene Essex, MD;  Location: [place];  Service: Endoscopy;;   TRANSTHORACIC ECHOCARDIOGRAM  04/02/2007   EF 35%   VIDEO BRONCHOSCOPY Right 07/22/2020   Procedure: VIDEO BRONCHOSCOPY WITH FLUORO, BAL AND BIOPSY;  Surgeon: Collene Gobble, MD;  Location: [facility];  Service: Cardiopulmonary;  Laterality: Right;  CONSCIOUS SEDATION OR MAC    FAMILY  HISTORY Family History  Problem Relation Age of Onset   Heart disease Father    Emphysema Father    Stroke Mother    Cancer Sister        breast cancer   Heart disease Paternal Grandmother    Heart disease Paternal Grandfather    Diabetes Other        grandmother, aunt,  uncle   Cancer Other        lung cancer, aunt    SOCIAL HISTORY Social History   Tobacco Use   Smoking status: Former    Packs/day: 2.00    Years: 16.00    Pack years: 32.00    Types: Cigarettes    Start date: 02/23/1956    Quit date: 01/31/1971    Years since quitting: 49.8   Smokeless tobacco: Never  Vaping Use   Vaping Use: Never used  Substance Use Topics   Alcohol use: Yes    Alcohol/week: 0.0 standard drinks    Comment: 1-2 a week   Drug use: No         OPHTHALMIC EXAM:  Base Eye Exam     Visual Acuity (ETDRS)       Right Left   Dist cc 20/20 20/30 -2    Correction: Glasses         Tonometry (Tonopen, 1:05 PM)       Right Left   Pressure 8 10         Pupils       Pupils Dark Light Shape APD   Right PERRL 4 3 Round None   Left PERRL 4 3 Round None         Extraocular Movement       Right Left    Full Full         Neuro/Psych     Oriented x3: Yes   Mood/Affect: Normal         Dilation     Both eyes: 1.0% Mydriacyl, 2.5% Phenylephrine @ 1:05 PM           Slit Lamp and Fundus Exam     External Exam       Right Left   External Normal Normal         Slit Lamp Exam       Right Left   Lids/Lashes Normal Normal   Conjunctiva/Sclera White and quiet White and quiet   Cornea Clear Clear   Anterior Chamber Deep and quiet Deep and quiet   Iris Round and reactive Round and reactive   Lens Posterior chamber intraocular lens, Open posterior capsule Posterior chamber intraocular lens, Open posterior capsule   Anterior Vitreous Normal Normal         Fundus Exam       Right Left   Posterior Vitreous Posterior vitreous detachment Posterior  vitreous detachment   Disc Normal Normal   C/D Ratio 0.35 0.5   Macula ,, Microaneurysms, no macular thickening, no clinically significant macular edema ,, Microaneurysms, no macular thickening, no clinically significant macular edema   Vessels NPDR- Moderate NPDR- Moderate   Periphery Pavingstone degeneration, Pigmentation Pavingstone degeneration, Pigmentation            IMAGING AND PROCEDURES  Imaging and Procedures for 11/30/20  OCT, Retina - OU - Both Eyes       Right Eye Quality was good. Scan locations included subfoveal. Central Foveal Thickness: 285. Progression has been stable. Findings include normal foveal contour.   Left Eye Quality was good. Scan locations included subfoveal. Central Foveal Thickness: 274. Progression has been stable. Findings include normal foveal contour.   Notes Normal macular contour OU incidental PVD OU.     Color Fundus Photography Optos - OU - Both Eyes       Right Eye Progression has been stable. Disc findings include hemorrhage. Macula : normal observations. Vessels : normal observations.  Periphery : RPE abnormality.   Left Eye Progression has been stable. Disc findings include normal observations. Macula : normal observations. Vessels : normal observations. Periphery : RPE abnormality.   Notes  Incidental posterior vitreous detachment OU.  Moderate nonproliferative diabetic retinopathy with otherwise reticulated RPE degeneration in the mid periphery, OU             ASSESSMENT/PLAN:  Moderate nonproliferative diabetic retinopathy of both eyes (HCC) Moderate NPDR      ICD-10-CM   1. Moderate nonproliferative diabetic retinopathy of both eyes associated with type 2 diabetes mellitus, macular edema presence unspecified (HCC)  K99.8338 OCT, Retina - OU - Both Eyes    Color Fundus Photography Optos - OU - Both Eyes      1.  OU with moderate nonproliferative diabetic retinopathy overall stable over many years.  No  significant progression  2.  Patient had some lengthy discussion about hemoglobin A1c which elevated from the sixes to 7.6 off of Actos.  I have explained the patient my personal experience seeing Actos induced (glitazones) severe nondiabetic macular edema, that resolves with cessation of the medication, as well as the weight loss that is typically attended with cessation of glitazone's.  I would argue that he probably can tolerate a slightly higher hemoglobin A1c off of Actos and perhaps other substitution medications thus I would not be "campaigning" as he says to resume active  3.  Ophthalmic Meds Ordered this visit:  No orders of the defined types were placed in this encounter.      Return in about 9 months (around 08/30/2021) for DILATE OU, COLOR FP, OCT.  There are no Patient Instructions on file for this visit.   Explained the diagnoses, plan, and follow up with the patient and they expressed understanding.  Patient expressed understanding of the importance of proper follow up care.   Clent Demark Shonice Wrisley M.D. Diseases & Surgery of the Retina and Vitreous Retina & Diabetic [place] 11/30/20     Abbreviations: M myopia (nearsighted); A astigmatism; H hyperopia (farsighted); P presbyopia; Mrx spectacle prescription;  CTL contact lenses; OD right eye; OS left eye; OU both eyes  XT exotropia; ET esotropia; PEK punctate epithelial keratitis; PEE punctate epithelial erosions; DES dry eye syndrome; MGD meibomian gland dysfunction; ATs artificial tears; PFAT's preservative free artificial tears; [place] nuclear sclerotic cataract; PSC posterior subcapsular cataract; ERM epi-retinal membrane; PVD posterior vitreous detachment; RD retinal detachment; DM diabetes mellitus; DR diabetic retinopathy; NPDR non-proliferative diabetic retinopathy; PDR proliferative diabetic retinopathy; CSME clinically significant macular edema; DME diabetic macular edema; dbh dot blot hemorrhages; CWS cotton wool spot; POAG  primary open angle glaucoma; C/D cup-to-disc ratio; HVF humphrey visual field; GVF goldmann visual field; OCT optical coherence tomography; IOP intraocular pressure; BRVO Branch retinal vein occlusion; CRVO central retinal vein occlusion; CRAO central retinal artery occlusion; BRAO branch retinal artery occlusion; RT retinal tear; SB scleral buckle; PPV pars plana vitrectomy; VH Vitreous hemorrhage; PRP panretinal laser photocoagulation; IVK intravitreal kenalog; VMT vitreomacular traction; MH Macular hole;  NVD neovascularization of the disc; NVE neovascularization elsewhere; AREDS age related eye disease study; ARMD age related macular degeneration; POAG primary open angle glaucoma; EBMD epithelial/anterior basement membrane dystrophy; ACIOL anterior chamber intraocular lens; IOL intraocular lens; PCIOL posterior chamber intraocular lens; Phaco/IOL phacoemulsification with intraocular lens placement; [place] photorefractive keratectomy; LASIK laser assisted in situ keratomileusis; HTN hypertension; DM diabetes mellitus; COPD chronic obstructive pulmonary disease

## 2020-12-06 ENCOUNTER — Other Ambulatory Visit: Payer: Self-pay

## 2020-12-06 DIAGNOSIS — G43109 Migraine with aura, not intractable, without status migrainosus: Secondary | ICD-10-CM

## 2020-12-06 MED ORDER — AIMOVIG 140 MG/ML ~~LOC~~ SOAJ: 140.0000 mg | SUBCUTANEOUS | 11 refills | Status: DC

## 2021-01-21 ENCOUNTER — Encounter: Payer: Self-pay | Admitting: Internal Medicine

## 2021-01-21 ENCOUNTER — Telehealth: Payer: Medicare Other | Admitting: Physician Assistant

## 2021-01-21 DIAGNOSIS — U071 COVID-19: Secondary | ICD-10-CM | POA: Diagnosis not present

## 2021-01-21 DIAGNOSIS — I6521 Occlusion and stenosis of right carotid artery: Secondary | ICD-10-CM

## 2021-01-21 MED ORDER — MOLNUPIRAVIR EUA 200MG CAPSULE: 4.0000 | ORAL_CAPSULE | Freq: Two times a day (BID) | ORAL | 0 refills | Status: AC

## 2021-01-21 NOTE — Progress Notes (Signed)
Virtual Visit Consent   [name], you are scheduled for a virtual visit with a [facility] provider today.     Just as with appointments in the office, your consent must be obtained to participate.  Your consent will be active for this visit and any virtual visit you may have with one of our providers in the next 365 days.     If you have a MyChart account, a copy of this consent can be sent to you electronically.  All virtual visits are billed to your insurance company just like a traditional visit in the office.    As this is a virtual visit, video technology does not allow for your provider to perform a traditional examination.  This may limit your provider's ability to fully assess your condition.  If your provider identifies any concerns that need to be evaluated in person or the need to arrange testing (such as labs, EKG, etc.), we will make arrangements to do so.     Although advances in technology are sophisticated, we cannot ensure that it will always work on either your end or our end.  If the connection with a video visit is poor, the visit may have to be switched to a telephone visit.  With either a video or telephone visit, we are not always able to ensure that we have a secure connection.     I need to obtain your verbal consent now.   Are you willing to proceed with your visit today?    HEYWOOD TOKUNAGA has provided verbal consent on 01/21/2021 for a virtual visit (video or telephone).   Mar Daring, PA-C   Date: 01/21/2021 12:07 PM   Virtual Visit via Video Note   I, Mar Daring, connected with  [name]  ([id number], 09-07-1931) on 01/21/21 at 12:00 PM EST by a video-enabled telemedicine application and verified that I am speaking with the correct person using two identifiers.  Location: Patient: Virtual Visit Location Patient: Home Provider: Virtual Visit Location Provider: Home Office   I discussed the limitations of evaluation and  management by telemedicine and the availability of in person appointments. The patient expressed understanding and agreed to proceed.    History of Present Illness: [name] is a 87 y.o. who identifies as a male who was assigned male at birth, and is being seen today for Covid 66.  HPI: URI  This is a new problem. The current episode started yesterday (Tested positive for Covid 19 yesterday). The problem has been unchanged. There has been no fever. Pertinent negatives include no congestion, headaches or rhinorrhea. He has tried nothing for the symptoms. The treatment provided no relief.     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   Chronic anticoagulation 04/23/2019   History of ventricular tachycardia 55/73/2202   Acute systolic heart failure (HCC)    QT prolongation    LFT elevation 01/04/2019   Gallstones 01/04/2019   Abdominal aortic aneurysm (AAA) without rupture 01/04/2019  Acute pancreatitis 01/03/2019   CHF (congestive heart failure) ([place]) 11/12/2018   AICD (automatic cardioverter/defibrillator) present    History of seasonal allergies    Chronic combined systolic (congestive) and diastolic (congestive) heart failure (HCC)    Acute on chronic combined systolic (congestive) and diastolic (congestive) heart failure (HCC)    Congestive heart failure (CHF) ([place]) 11/11/2018   Amaurosis fugax of right eye 11/05/2018   Preop cardiovascular  exam    Type 2 diabetes mellitus with complication, without long-term current use of insulin ([place]) 11/04/2018   TIA (transient ischemic attack) 11/04/2018   CAD (coronary artery disease) 11/04/2018   Hyponatremia 11/04/2018   Closed fracture of right iliac crest ([place])    Syncope 03/04/2018   Pelvic fracture ([place]) 03/04/2018   Asthma-COPD overlap syndrome ([place]) 02/22/2018   Cough 02/22/2018   Mixed dyslipidemia 05/16/2016   Vitamin D deficiency 05/16/2016   Orthostatic presyncope 08/01/2010   Coronary artery disease with exertional angina ([place]) 08/01/2010   Ventricular tachycardia, polymorphic    ATRIAL FIBRILLATION 06/15/2008   PREMATURE VENTRICULAR CONTRACTIONS 04/23/2008   RAYNAUD'S DISEASE 04/23/2008   Automatic implantable cardioverter-defibrillator in situ 04/23/2008    Allergies:  Allergies  Allergen Reactions   Bee Venom Anaphylaxis   Latex Other (See Comments)    REDNESS AT REACTION SITE.   Metformin Diarrhea   Rivaroxaban Other (See Comments)    Headaches, blurred vision   Sotalol Other (See Comments)    "BLUE TOES"- cut his circulation off   Amiodarone     Other reaction(s): rash, doesn't sound like DRESS syndrome.   Levofloxacin     Other reaction(s): long QT syndrome, so avoid   Warfarin Sodium Other (See Comments)    REACTION: migraine headaches/vision impariment   Medications:  Current Outpatient Medications:    albuterol (VENTOLIN HFA) 108 (90 Base) MCG/ACT inhaler, Inhale 2 puffs into the lungs every 6 (six) hours as needed for wheezing or shortness of breath., Disp: 8 g, Rfl: 5   apixaban (ELIQUIS) 5 MG TABS tablet, Take 1 tablet (5 mg total) by mouth 2 (two) times daily. Okay to restart this medication on 07/23/2020, Disp: , Rfl:    atorvastatin (LIPITOR) 80 MG tablet, Take 1 tablet (80 mg total) by mouth at bedtime., Disp: , Rfl:    cetirizine (ZYRTEC) 10 MG tablet, Take 10 mg by mouth in the morning., Disp: , Rfl:    Cholecalciferol (VITAMIN D) 125 MCG  (5000 UT) CAPS, Take 5,000-10,000 Units by mouth See admin instructions. Take 1 capsule (5000 units) by mouth on Tuesdays, Thursdays, Saturdays, & Sundays at bedtime. Take 2 capsules (10000 units) by mouth on Mondays, Wednesdays, & Fridays at bedtime. (Patient not taking: Reported on 11/16/2020), Disp: , Rfl:    diphenhydrAMINE (BENADRYL) 25 MG tablet, Take 25 mg by mouth at bedtime., Disp: , Rfl:    dofetilide ([name]) 500 MCG capsule, Take 1 capsule (500 mcg total) by mouth 2 (two) times daily., Disp: 180 capsule, Rfl: 2   empagliflozin (JARDIANCE) 10 MG TABS tablet, Take 10 mg by mouth in the morning. (Patient not taking: Reported on 11/16/2020), Disp: , Rfl:    empagliflozin (JARDIANCE) 25 MG TABS tablet, Take 25 mg by mouth in the morning., Disp: , Rfl:    EPIPEN 2-PAK 0.3 MG/0.3ML SOAJ injection, Inject 0.3 mg into the muscle daily as needed for anaphylaxis. , Disp: , Rfl:    Erenumab-aooe (AIMOVIG) 140 MG/ML SOAJ, Inject 140 mg into the skin every 28 (twenty-eight) days., Disp: 1.12 mL,  Rfl: 11   fluticasone (FLONASE) 50 MCG/ACT nasal spray, Place 1 spray into the nose daily as needed for rhinitis or allergies., Disp: , Rfl:    ibuprofen (ADVIL) 200 MG tablet, Take 400 mg by mouth every 8 (eight) hours as needed (headaches/migraine)., Disp: , Rfl:    Magnesium 250 MG TABS, Take 250 mg by mouth in the morning., Disp: , Rfl:    metoprolol succinate (TOPROL-XL) 25 MG 24 hr tablet, Take 1 tablet (25 mg total) by mouth 2 (two) times daily with a meal. Take with or immediately following a meal., Disp: 180 tablet, Rfl: 3   Multiple Vitamin (MULTIVITAMIN WITH MINERALS) TABS tablet, Take 1 tablet by mouth at bedtime. Centrum Silver, Disp: , Rfl:    Multiple Vitamins-Minerals (PRESERVISION AREDS 2 PO), Take 1 tablet by mouth in the morning., Disp: , Rfl:    nateglinide (STARLIX) 60 MG tablet, Take 1 tablet twice a day before meals for diabetes, Disp: , Rfl:    Olodaterol HCl (STRIVERDI RESPIMAT) 2.5  MCG/ACT AERS, Inhale 2 puffs into the lungs at bedtime as needed., Disp: , Rfl:    sitaGLIPtin (JANUVIA) 100 MG tablet, Take 100 mg by mouth at bedtime., Disp: , Rfl:    Tiotropium Bromide-Olodaterol (STIOLTO RESPIMAT) 2.5-2.5 MCG/ACT AERS, Inhale 2 puffs into the lungs at bedtime., Disp: , Rfl:   Observations/Objective: Patient is well-developed, well-nourished in no acute distress.  Resting comfortably at home.  Head is normocephalic, atraumatic.  No labored breathing.  Speech is clear and coherent with logical content.  Patient is alert and oriented at baseline.    Assessment and Plan: 1. COVID-19  - Continue OTC symptomatic management of choice - Will send OTC vitamins and supplement information through AVS - Molnupiravir prescribed - Patient enrolled in MyChart symptom monitoring - Push fluids - Rest as needed - Discussed return precautions and when to seek in-person evaluation, sent via AVS as well   Follow Up Instructions: I discussed the assessment and treatment plan with the patient. The patient was provided an opportunity to ask questions and all were answered. The patient agreed with the plan and demonstrated an understanding of the instructions.  A copy of instructions were sent to the patient via MyChart unless otherwise noted below.    The patient was advised to call back or seek an in-person evaluation if the symptoms worsen or if the condition fails to improve as anticipated.  Time:  I spent 13 minutes with the patient via telehealth technology discussing the above problems/concerns.    Mar Daring, PA-C

## 2021-01-21 NOTE — Patient Instructions (Signed)
[name], thank you for joining Mar Daring, PA-C for today's virtual visit.  While this provider is not your primary care provider (PCP), if your PCP is located in our provider database this encounter information will be shared with them immediately following your visit.  Consent: (Patient) [name] provided verbal consent for this virtual visit at the beginning of the encounter.  Current Medications:  Current Outpatient Medications:    molnupiravir EUA (LAGEVRIO) 200 mg CAPS capsule, Take 4 capsules (800 mg total) by mouth 2 (two) times daily for 5 days., Disp: 40 capsule, Rfl: 0   albuterol (VENTOLIN HFA) 108 (90 Base) MCG/ACT inhaler, Inhale 2 puffs into the lungs every 6 (six) hours as needed for wheezing or shortness of breath., Disp: 8 g, Rfl: 5   apixaban (ELIQUIS) 5 MG TABS tablet, Take 1 tablet (5 mg total) by mouth 2 (two) times daily. Okay to restart this medication on 07/23/2020, Disp: , Rfl:    atorvastatin (LIPITOR) 80 MG tablet, Take 1 tablet (80 mg total) by mouth at bedtime., Disp: , Rfl:    cetirizine (ZYRTEC) 10 MG tablet, Take 10 mg by mouth in the morning., Disp: , Rfl:    Cholecalciferol (VITAMIN D) 125 MCG (5000 UT) CAPS, Take 5,000-10,000 Units by mouth See admin instructions. Take 1 capsule (5000 units) by mouth on Tuesdays, Thursdays, Saturdays, & Sundays at bedtime. Take 2 capsules (10000 units) by mouth on Mondays, Wednesdays, & Fridays at bedtime. (Patient not taking: Reported on 11/16/2020), Disp: , Rfl:    diphenhydrAMINE (BENADRYL) 25 MG tablet, Take 25 mg by mouth at bedtime., Disp: , Rfl:    dofetilide ([name]) 500 MCG capsule, Take 1 capsule (500 mcg total) by mouth 2 (two) times daily., Disp: 180 capsule, Rfl: 2   empagliflozin (JARDIANCE) 10 MG TABS tablet, Take 10 mg by mouth in the morning. (Patient not taking: Reported on 11/16/2020), Disp: , Rfl:    empagliflozin (JARDIANCE) 25 MG TABS tablet, Take 25 mg by mouth in the morning.,  Disp: , Rfl:    EPIPEN 2-PAK 0.3 MG/0.3ML SOAJ injection, Inject 0.3 mg into the muscle daily as needed for anaphylaxis. , Disp: , Rfl:    Erenumab-aooe (AIMOVIG) 140 MG/ML SOAJ, Inject 140 mg into the skin every 28 (twenty-eight) days., Disp: 1.12 mL, Rfl: 11   fluticasone (FLONASE) 50 MCG/ACT nasal spray, Place 1 spray into the nose daily as needed for rhinitis or allergies., Disp: , Rfl:    ibuprofen (ADVIL) 200 MG tablet, Take 400 mg by mouth every 8 (eight) hours as needed (headaches/migraine)., Disp: , Rfl:    Magnesium 250 MG TABS, Take 250 mg by mouth in the morning., Disp: , Rfl:    metoprolol succinate (TOPROL-XL) 25 MG 24 hr tablet, Take 1 tablet (25 mg total) by mouth 2 (two) times daily with a meal. Take with or immediately following a meal., Disp: 180 tablet, Rfl: 3   Multiple Vitamin (MULTIVITAMIN WITH MINERALS) TABS tablet, Take 1 tablet by mouth at bedtime. Centrum Silver, Disp: , Rfl:    Multiple Vitamins-Minerals (PRESERVISION AREDS 2 PO), Take 1 tablet by mouth in the morning., Disp: , Rfl:    nateglinide (STARLIX) 60 MG tablet, Take 1 tablet twice a day before meals for diabetes, Disp: , Rfl:    Olodaterol HCl (STRIVERDI RESPIMAT) 2.5 MCG/ACT AERS, Inhale 2 puffs into the lungs at bedtime as needed., Disp: , Rfl:    sitaGLIPtin (JANUVIA) 100 MG tablet, Take 100 mg by mouth  at bedtime., Disp: , Rfl:    Tiotropium Bromide-Olodaterol (STIOLTO RESPIMAT) 2.5-2.5 MCG/ACT AERS, Inhale 2 puffs into the lungs at bedtime., Disp: , Rfl:    Medications ordered in this encounter:  Meds ordered this encounter  Medications   molnupiravir EUA (LAGEVRIO) 200 mg CAPS capsule    Sig: Take 4 capsules (800 mg total) by mouth 2 (two) times daily for 5 days.    Dispense:  40 capsule    Refill:  0    Order Specific Question:   Supervising Provider    Answer:   Sabra Heck, [place]     *If you need refills on other medications prior to your next appointment, please contact your  pharmacy*  Follow-Up: Call back or seek an in-person evaluation if the symptoms worsen or if the condition fails to improve as anticipated.  Other Instructions COVID-19: Quarantine and Isolation Quarantine If you were exposed Quarantine and stay away from others when you have been in close contact with someone who has COVID-19. Isolate If you are sick or test positive Isolate when you are sick or when you have COVID-19, even if you don't have symptoms. When to stay home Calculating quarantine The date of your exposure is considered day 0. Day 1 is the first full day after your last contact with a person who has had COVID-19. Stay home and away from other people for at least 5 days. Learn why CDC updated guidance for the general public. IF YOU were exposed to COVID-19 and are NOT  up to dateIF YOU were exposed to COVID-19 and are NOT on COVID-19 vaccinations Quarantine for at least 5 days Stay home Stay home and quarantine for at least 5 full days. Wear a well-fitting mask if you must be around others in your home. Do not travel. Get tested Even if you don't develop symptoms, get tested at least 5 days after you last had close contact with someone with COVID-19. After quarantine Watch for symptoms Watch for symptoms until 10 days after you last had close contact with someone with COVID-19. Avoid travel It is best to avoid travel until a full 10 days after you last had close contact with someone with COVID-19. If you develop symptoms Isolate immediately and get tested. Continue to stay home until you know the results. Wear a well-fitting mask around others. Take precautions until day 10 Wear a well-fitting mask Wear a well-fitting mask for 10 full days any time you are around others inside your home or in public. Do not go to places where you are unable to wear a well-fitting mask. If you must travel during days 6-10, take precautions. Avoid being around people who are more likely  to get very sick from COVID-19. IF YOU were exposed to COVID-19 and are  up to dateIF YOU were exposed to COVID-19 and are on COVID-19 vaccinations No quarantine You do not need to stay home unless you develop symptoms. Get tested Even if you don't develop symptoms, get tested at least 5 days after you last had close contact with someone with COVID-19. Watch for symptoms Watch for symptoms until 10 days after you last had close contact with someone with COVID-19. If you develop symptoms Isolate immediately and get tested. Continue to stay home until you know the results. Wear a well-fitting mask around others. Take precautions until day 10 Wear a well-fitting mask Wear a well-fitting mask for 10 full days any time you are around others inside your home or in public.  Do not go to places where you are unable to wear a well-fitting mask. Take precautions if traveling Avoid being around people who are more likely to get very sick from COVID-19. IF YOU were exposed to COVID-19 and had confirmed COVID-19 within the past 90 days (you tested positive using a viral test) No quarantine You do not need to stay home unless you develop symptoms. Watch for symptoms Watch for symptoms until 10 days after you last had close contact with someone with COVID-19. If you develop symptoms Isolate immediately and get tested. Continue to stay home until you know the results. Wear a well-fitting mask around others. Take precautions until day 10 Wear a well-fitting mask Wear a well-fitting mask for 10 full days any time you are around others inside your home or in public. Do not go to places where you are unable to wear a well-fitting mask. Take precautions if traveling Avoid being around people who are more likely to get very sick from COVID-19. Calculating isolation Day 0 is your first day of symptoms or a positive viral test. Day 1 is the first full day after your symptoms developed or your test specimen was  collected. If you have COVID-19 or have symptoms, isolate for at least 5 days. IF YOU tested positive for COVID-19 or have symptoms, regardless of vaccination status Stay home for at least 5 days Stay home for 5 days and isolate from others in your home. Wear a well-fitting mask if you must be around others in your home. Do not travel. Ending isolation if you had symptoms End isolation after 5 full days if you are fever-free for 24 hours (without the use of fever-reducing medication) and your symptoms are improving. Ending isolation if you did NOT have symptoms End isolation after at least 5 full days after your positive test. If you got very sick from COVID-19 or have a weakened immune system You should isolate for at least 10 days. Consult your doctor before ending isolation. Take precautions until day 10 Wear a well-fitting mask Wear a well-fitting mask for 10 full days any time you are around others inside your home or in public. Do not go to places where you are unable to wear a well-fitting mask. Do not travel Do not travel until a full 10 days after your symptoms started or the date your positive test was taken if you had no symptoms. Avoid being around people who are more likely to get very sick from COVID-19. Definitions Exposure Contact with someone infected with SARS-CoV-2, the virus that causes COVID-19, in a way that increases the likelihood of getting infected with the virus. Close contact A close contact is someone who was less than 6 feet away from an infected person (laboratory-confirmed or a clinical diagnosis) for a cumulative total of 15 minutes or more over a 24-hour period. For example, three individual 5-minute exposures for a total of 15 minutes. People who are exposed to someone with COVID-19 after they completed at least 5 days of isolation are not considered close contacts. Julio Sicks is a strategy used to prevent transmission of COVID-19 by keeping  people who have been in close contact with someone with COVID-19 apart from others. Who does not need to quarantine? If you had close contact with someone with COVID-19 and you are in one of the following groups, you do not need to quarantine. You are up to date with your COVID-19 vaccines. You had confirmed COVID-19 within the last 90 days (meaning  you tested positive using a viral test). If you are up to date with COVID-19 vaccines, you should wear a well-fitting mask around others for 10 days from the date of your last close contact with someone with COVID-19 (the date of last close contact is considered day 0). Get tested at least 5 days after you last had close contact with someone with COVID-19. If you test positive or develop COVID-19 symptoms, isolate from other people and follow recommendations in the Isolation section below. If you tested positive for COVID-19 with a viral test within the previous 90 days and subsequently recovered and remain without COVID-19 symptoms, you do not need to quarantine or get tested after close contact. You should wear a well-fitting mask around others for 10 days from the date of your last close contact with someone with COVID-19 (the date of last close contact is considered day 0). If you have COVID-19 symptoms, get tested and isolate from other people and follow recommendations in the Isolation section below. Who should quarantine? If you come into close contact with someone with COVID-19, you should quarantine if you are not up to date on COVID-19 vaccines. This includes people who are not vaccinated. What to do for quarantine Stay home and away from other people for at least 5 days (day 0 through day 5) after your last contact with a person who has COVID-19. The date of your exposure is considered day 0. Wear a well-fitting mask when around others at home, if possible. For 10 days after your last close contact with someone with COVID-19, watch for fever (100.49F  or greater), cough, shortness of breath, or other COVID-19 symptoms. If you develop symptoms, get tested immediately and isolate until you receive your test results. If you test positive, follow isolation recommendations. If you do not develop symptoms, get tested at least 5 days after you last had close contact with someone with COVID-19. If you test negative, you can leave your home, but continue to wear a well-fitting mask when around others at home and in public until 10 days after your last close contact with someone with COVID-19. If you test positive, you should isolate for at least 5 days from the date of your positive test (if you do not have symptoms). If you do develop COVID-19 symptoms, isolate for at least 5 days from the date your symptoms began (the date the symptoms started is day 0). Follow recommendations in the isolation section below. If you are unable to get a test 5 days after last close contact with someone with COVID-19, you can leave your home after day 5 if you have been without COVID-19 symptoms throughout the 5-day period. Wear a well-fitting mask for 10 days after your date of last close contact when around others at home and in public. Avoid people who are have weakened immune systems or are more likely to get very sick from COVID-19, and nursing homes and other high-risk settings, until after at least 10 days. If possible, stay away from people you live with, especially people who are at higher risk for getting very sick from COVID-19, as well as others outside your home throughout the full 10 days after your last close contact with someone with COVID-19. If you are unable to quarantine, you should wear a well-fitting mask for 10 days when around others at home and in public. If you are unable to wear a mask when around others, you should continue to quarantine for 10 days. Avoid people  who have weakened immune systems or are more likely to get very sick from COVID-19, and  nursing homes and other high-risk settings, until after at least 10 days. See additional information about travel. Do not go to places where you are unable to wear a mask, such as restaurants and some gyms, and avoid eating around others at home and at work until after 10 days after your last close contact with someone with COVID-19. After quarantine Watch for symptoms until 10 days after your last close contact with someone with COVID-19. If you have symptoms, isolate immediately and get tested. Quarantine in high-risk congregate settings In certain congregate settings that have high risk of secondary transmission (such as Systems analyst and detention facilities, homeless shelters, or cruise ships), CDC recommends a 10-day quarantine for residents, regardless of vaccination and booster status. During periods of critical staffing shortages, facilities may consider shortening the quarantine period for staff to ensure continuity of operations. Decisions to shorten quarantine in these settings should be made in consultation with state, local, tribal, or territorial health departments and should take into consideration the context and characteristics of the facility. CDC's setting-specific guidance provides additional recommendations for these settings. Isolation Isolation is used to separate people with confirmed or suspected COVID-19 from those without COVID-19. People who are in isolation should stay home until it's safe for them to be around others. At home, anyone sick or infected should separate from others, or wear a well-fitting mask when they need to be around others. People in isolation should stay in a specific "sick room" or area and use a separate bathroom if available. Everyone who has presumed or confirmed COVID-19 should stay home and isolate from other people for at least 5 full days (day 0 is the first day of symptoms or the date of the day of the positive viral test for asymptomatic persons).  They should wear a mask when around others at home and in public for an additional 5 days. People who are confirmed to have COVID-19 or are showing symptoms of COVID-19 need to isolate regardless of their vaccination status. This includes: People who have a positive viral test for COVID-19, regardless of whether or not they have symptoms. People with symptoms of COVID-19, including people who are awaiting test results or have not been tested. People with symptoms should isolate even if they do not know if they have been in close contact with someone with COVID-19. What to do for isolation Monitor your symptoms. If you have an emergency warning sign (including trouble breathing), seek emergency medical care immediately. Stay in a separate room from other household members, if possible. Use a separate bathroom, if possible. Take steps to improve ventilation at home, if possible. Avoid contact with other members of the household and pets. Don't share personal household items, like cups, towels, and utensils. Wear a well-fitting mask when you need to be around other people. Learn more about what to do if you are sick and how to notify your contacts. Ending isolation for people who had COVID-19 and had symptoms If you had COVID-19 and had symptoms, isolate for at least 5 days. To calculate your 5-day isolation period, day 0 is your first day of symptoms. Day 1 is the first full day after your symptoms developed. You can leave isolation after 5 full days. You can end isolation after 5 full days if you are fever-free for 24 hours without the use of fever-reducing medication and your other symptoms have improved (Loss of  taste and smell may persist for weeks or months after recovery and need not delay the end of isolation). You should continue to wear a well-fitting mask around others at home and in public for 5 additional days (day 6 through day 10) after the end of your 5-day isolation period. If you are  unable to wear a mask when around others, you should continue to isolate for a full 10 days. Avoid people who have weakened immune systems or are more likely to get very sick from COVID-19, and nursing homes and other high-risk settings, until after at least 10 days. If you continue to have fever or your other symptoms have not improved after 5 days of isolation, you should wait to end your isolation until you are fever-free for 24 hours without the use of fever-reducing medication and your other symptoms have improved. Continue to wear a well-fitting mask through day 10. Contact your healthcare provider if you have questions. See additional information about travel. Do not go to places where you are unable to wear a mask, such as restaurants and some gyms, and avoid eating around others at home and at work until a full 10 days after your first day of symptoms. If an individual has access to a test and wants to test, the best approach is to use an antigen test1 towards the end of the 5-day isolation period. Collect the test sample only if you are fever-free for 24 hours without the use of fever-reducing medication and your other symptoms have improved (loss of taste and smell may persist for weeks or months after recovery and need not delay the end of isolation). If your test result is positive, you should continue to isolate until day 10. If your test result is negative, you can end isolation, but continue to wear a well-fitting mask around others at home and in public until day 10. Follow additional recommendations for masking and avoiding travel as described above. 1As noted in the labeling for authorized over-the counter antigen tests: Negative results should be treated as presumptive. Negative results do not rule out SARS-CoV-2 infection and should not be used as the sole basis for treatment or patient management decisions, including infection control decisions. To improve results, antigen tests should be  used twice over a three-day period with at least 24 hours and no more than 48 hours between tests. Note that these recommendations on ending isolation do not apply to people who are moderately ill or very sick from COVID-19 or have weakened immune systems. See section below for recommendations for when to end isolation for these groups. Ending isolation for people who tested positive for COVID-19 but had no symptoms If you test positive for COVID-19 and never develop symptoms, isolate for at least 5 days. Day 0 is the day of your positive viral test (based on the date you were tested) and day 1 is the first full day after the specimen was collected for your positive test. You can leave isolation after 5 full days. If you continue to have no symptoms, you can end isolation after at least 5 days. You should continue to wear a well-fitting mask around others at home and in public until day 10 (day 6 through day 10). If you are unable to wear a mask when around others, you should continue to isolate for 10 days. Avoid people who have weakened immune systems or are more likely to get very sick from COVID-19, and nursing homes and other high-risk settings, until  after at least 10 days. If you develop symptoms after testing positive, your 5-day isolation period should start over. Day 0 is your first day of symptoms. Follow the recommendations above for ending isolation for people who had COVID-19 and had symptoms. See additional information about travel. Do not go to places where you are unable to wear a mask, such as restaurants and some gyms, and avoid eating around others at home and at work until 10 days after the day of your positive test. If an individual has access to a test and wants to test, the best approach is to use an antigen test1 towards the end of the 5-day isolation period. If your test result is positive, you should continue to isolate until day 10. If your test result is positive, you can also  choose to test daily and if your test result is negative, you can end isolation, but continue to wear a well-fitting mask around others at home and in public until day 10. Follow additional recommendations for masking and avoiding travel as described above. 1As noted in the labeling for authorized over-the counter antigen tests: Negative results should be treated as presumptive. Negative results do not rule out SARS-CoV-2 infection and should not be used as the sole basis for treatment or patient management decisions, including infection control decisions. To improve results, antigen tests should be used twice over a three-day period with at least 24 hours and no more than 48 hours between tests. Ending isolation for people who were moderately or very sick from COVID-19 or have a weakened immune system People who are moderately ill from COVID-19 (experiencing symptoms that affect the lungs like shortness of breath or difficulty breathing) should isolate for 10 days and follow all other isolation precautions. To calculate your 10-day isolation period, day 0 is your first day of symptoms. Day 1 is the first full day after your symptoms developed. If you are unsure if your symptoms are moderate, talk to a healthcare provider for further guidance. People who are very sick from COVID-19 (this means people who were hospitalized or required intensive care or ventilation support) and people who have weakened immune systems might need to isolate at home longer. They may also require testing with a viral test to determine when they can be around others. CDC recommends an isolation period of at least 10 and up to 20 days for people who were very sick from COVID-19 and for people with weakened immune systems. Consult with your healthcare provider about when you can resume being around other people. If you are unsure if your symptoms are severe or if you have a weakened immune system, talk to a healthcare provider for  further guidance. People who have a weakened immune system should talk to their healthcare provider about the potential for reduced immune responses to COVID-19 vaccines and the need to continue to follow current prevention measures (including wearing a well-fitting mask and avoiding crowds and poorly ventilated indoor spaces) to protect themselves against COVID-19 until advised otherwise by their healthcare provider. Close contacts of immunocompromised people--including household members--should also be encouraged to receive all recommended COVID-19 vaccine doses to help protect these people. Isolation in high-risk congregate settings In certain high-risk congregate settings that have high risk of secondary transmission and where it is not feasible to cohort people (such as Systems analyst and detention facilities, homeless shelters, and cruise ships), CDC recommends a 10-day isolation period for residents. During periods of critical staffing shortages, facilities may consider shortening the  isolation period for staff to ensure continuity of operations. Decisions to shorten isolation in these settings should be made in consultation with state, local, tribal, or territorial health departments and should take into consideration the context and characteristics of the facility. CDC's setting-specific guidance provides additional recommendations for these settings. This CDC guidance is meant to supplement--not replace--any federal, state, local, territorial, or tribal health and safety laws, rules, and regulations. Recommendations for specific settings These recommendations do not apply to healthcare professionals. For guidance specific to these settings, see Healthcare professionals: Interim Guidance for Optician, dispensing with SARS-CoV-2 Infection or Exposure to SARS-CoV-2 Patients, residents, and visitors to healthcare settings: Interim Infection Prevention and Control Recommendations for Healthcare  Personnel During the [place] 2019 (COVID-19) Pandemic Additional setting-specific guidance and recommendations are available. These recommendations on quarantine and isolation do apply to [place] settings. Additional guidance is available here: Overview of COVID-19 Quarantine for K-12 Schools Travelers: Travel information and recommendations Congregate facilities and other settings: [employer] for community, work, and school settings Ongoing COVID-19 exposure FAQs I live with someone with COVID-19, but I cannot be separated from them. How do we manage quarantine in this situation? It is very important for people with COVID-19 to remain apart from other people, if possible, even if they are living together. If separation of the person with COVID-19 from others that they live with is not possible, the other people that they live with will have ongoing exposure, meaning they will be repeatedly exposed until that person is no longer able to spread the virus to other people. In this situation, there are precautions you can take to limit the spread of COVID-19: The person with COVID-19 and everyone they live with should wear a well-fitting mask inside the home. If possible, one person should care for the person with COVID-19 to limit the number of people who are in close contact with the infected person. Take steps to protect yourself and others to reduce transmission in the home: Quarantine if you are not up to date with your COVID-19 vaccines. Isolate if you are sick or tested positive for COVID-19, even if you don't have symptoms. Learn more about the public health recommendations for testing, mask use and quarantine of close contacts, like yourself, who have ongoing exposure. These recommendations differ depending on your vaccination status. What should I do if I have ongoing exposure to COVID-19 from someone I live with? Recommendations for this situation depend on your vaccination  status: If you are not up to date on COVID-19 vaccines and have ongoing exposure to COVID-19, you should: Begin quarantine immediately and continue to quarantine throughout the isolation period of the person with COVID-19. Continue to quarantine for an additional 5 days starting the day after the end of isolation for the person with COVID-19. Get tested at least 5 days after the end of isolation of the infected person that lives with them. If you test negative, you can leave the home but should continue to wear a well-fitting mask when around others at home and in public until 10 days after the end of isolation for the person with COVID-19. Isolate immediately if you develop symptoms of COVID-19 or test positive. If you are up to date with COVID-19 vaccines and have ongoing exposure to COVID-19, you should: Get tested at least 5 days after your first exposure. A person with COVID-19 is considered infectious starting 2 days before they develop symptoms, or 2 days before the date of their positive  test if they do not have symptoms. Get tested again at least 5 days after the end of isolation for the person with COVID-19. Wear a well-fitting mask when you are around the person with COVID-19, and do this throughout their isolation period. Wear a well-fitting mask around others for 10 days after the infected person's isolation period ends. Isolate immediately if you develop symptoms of COVID-19 or test positive. What should I do if multiple people I live with test positive for COVID-19 at different times? Recommendations for this situation depend on your vaccination status: If you are not up to date with your COVID-19 vaccines, you should: Quarantine throughout the isolation period of any infected person that you live with. Continue to quarantine until 5 days after the end of isolation date for the most recently infected person that lives with you. For example, if the last day of isolation of the person  most recently infected with COVID-19 was June 30, the new 5-day quarantine period starts on July 1. Get tested at least 5 days after the end of isolation for the most recently infected person that lives with you. Wear a well-fitting mask when you are around any person with COVID-19 while that person is in isolation. Wear a well-fitting mask when you are around other people until 10 days after your last close contact. Isolate immediately if you develop symptoms of COVID-19 or test positive. If you are up to date with your COVID-19 vaccines, you should: Get tested at least 5 days after your first exposure. A person with COVID-19 is considered infectious starting 2 days before they developed symptoms, or 2 days before the date of their positive test if they do not have symptoms. Get tested again at least 5 days after the end of isolation for the most recently infected person that lives with you. Wear a well-fitting mask when you are around any person with COVID-19 while that person is in isolation. Wear a well-fitting mask around others for 10 days after the end of isolation for the most recently infected person that lives with you. For example, if the last day of isolation for the person most recently infected with COVID-19 was June 30, the new 10-day period to wear a well-fitting mask indoors in public starts on July 1. Isolate immediately if you develop symptoms of COVID-19 or test positive. I had COVID-19 and completed isolation. Do I have to quarantine or get tested if someone I live with gets COVID-19 shortly after I completed isolation? No. If you recently completed isolation and someone that lives with you tests positive for the virus that causes COVID-19 shortly after the end of your isolation period, you do not have to quarantine or get tested as long as you do not develop new symptoms. Once all of the people that live together have completed isolation or quarantine, refer to the guidance below  for new exposures to COVID-19. If you had COVID-19 in the previous 90 days and then came into close contact with someone with COVID-19, you do not have to quarantine or get tested if you do not have symptoms. But you should: Wear a well-fitting mask indoors in public for 10 days after your last close contact. Monitor for COVID-19 symptoms for 10 days from the date of your last close contact. Isolate immediately and get tested if symptoms develop. If more than 90 days have passed since your recovery from infection, follow CDC's recommendations for close contacts. These recommendations will differ depending on your vaccination  status. 04/28/2020 Content source: [facility] ([facility]), Division of Viral Diseases This information is not intended to replace advice given to you by your health care provider. Make sure you discuss any questions you have with your health care provider. Document Revised: 09/01/2020 Document Reviewed: 09/01/2020 Elsevier Patient Education  2022 [place] Oral Capsules What is this medication? MOLNUPIRAVIR (mol nue pir a vir) treats COVID-19. It is an antiviral medication. It may decrease the risk of developing severe symptoms of COVID-19. It may also decrease the chance of going to the hospital. This medication is not approved by the FDA. The FDA has authorized emergency use of this medication during the COVID-19 pandemic. This medicine may be used for other purposes; ask your health care provider or pharmacist if you have questions. COMMON BRAND NAME(S): LAGEVRIO What should I tell my care team before I take this medication? They need to know if you have any of these conditions: Any allergies Any serious illness An unusual or allergic reaction to molnupiravir, other medications, foods, dyes, or preservatives Pregnant or trying to get pregnant Breast-feeding How should I use this medication? Take this  medication by mouth with water. Take it as directed on the prescription label at the same time every day. Do not cut, crush or chew this medication. Swallow the capsules whole. You can take it with or without food. If it upsets your stomach, take it with food. Take all of this medication unless your care team tells you to stop it early. Keep taking it even if you think you are better. Talk to your care team about the use of this medication in children. Special care may be needed. Overdosage: If you think you have taken too much of this medicine contact a poison control center or emergency room at once. NOTE: This medicine is only for you. Do not share this medicine with others. What if I miss a dose? If you miss a dose, take it as soon as you can unless it is more than 10 hours late. If it is more than 10 hours late, skip the missed dose. Take the next dose at the normal time. Do not take extra or 2 doses at the same time to make up for the missed dose. What may interact with this medication? Interactions have not been studied. This list may not describe all possible interactions. Give your health care provider a list of all the medicines, herbs, non-prescription drugs, or dietary supplements you use. Also tell them if you smoke, drink alcohol, or use illegal drugs. Some items may interact with your medicine. What should I watch for while using this medication? Your condition will be monitored carefully while you are receiving this medication. Visit your care team for regular checkups. Tell your care team if your symptoms do not start to get better or if they get worse. Do not become pregnant while taking this medication. You may need a pregnancy test before starting this medication. Women must use a reliable form of birth control while taking this medication and for 4 days after stopping the medication. Women should inform their care team if they wish to become pregnant or think they might be pregnant.  Men should not father a child while taking this medication and for 3 months after stopping it. There is potential for serious harm to an unborn child. Talk to your care team for more information. Do not breast-feed an infant while taking this medication and  for 4 days after stopping the medication. What side effects may I notice from receiving this medication? Side effects that you should report to your care team as soon as possible: Allergic reactions--skin rash, itching, hives, swelling of the face, lips, tongue, or throat Side effects that usually do not require medical attention (report these to your care team if they continue or are bothersome): Diarrhea Dizziness Nausea This list may not describe all possible side effects. Call your doctor for medical advice about side effects. You may report side effects to FDA at 1-800-FDA-1088. Where should I keep my medication? Keep out of the reach of children and pets. Store at room temperature between 20 and 25 degrees C (68 and 77 degrees F). Get rid of any unused medication after the expiration date. To get rid of medications that are no longer needed or have expired: Take the medication to a medication take-back program. Check with your pharmacy or law enforcement to find a location. If you cannot return the medication, check the label or package insert to see if the medication should be thrown out in the garbage or flushed down the toilet. If you are not sure, ask your care team. If it is safe to put it in the trash, take the medication out of the container. Mix the medication with cat litter, dirt, coffee grounds, or other unwanted substance. Seal the mixture in a bag or container. Put it in the trash. NOTE: This sheet is a summary. It may not cover all possible information. If you have questions about this medicine, talk to your doctor, pharmacist, or health care provider.  2022 Elsevier/Gold Standard (2020-01-26 00:00:00)   10 Things You Can  Do to Manage Your COVID-19 Symptoms at Home If you have possible or confirmed COVID-19 Stay home except to get medical care. Monitor your symptoms carefully. If your symptoms get worse, call your healthcare provider immediately. Get rest and stay hydrated. If you have a medical appointment, call the healthcare provider ahead of time and tell them that you have or may have COVID-19. For medical emergencies, call 911 and notify the dispatch personnel that you have or may have COVID-19. Cover your cough and sneezes with a tissue or use the inside of your elbow. Wash your hands often with soap and water for at least 20 seconds or clean your hands with an alcohol-based hand sanitizer that contains at least 60% alcohol. As much as possible, stay in a specific room and away from other people in your home. Also, you should use a separate bathroom, if available. If you need to be around other people in or outside of the home, wear a mask. Avoid sharing personal items with other people in your household, like dishes, towels, and bedding. Clean all surfaces that are touched often, like counters, tabletops, and doorknobs. Use household cleaning sprays or wipes according to the label instructions. [email or web address] 08/15/2019 This information is not intended to replace advice given to you by your health care provider. Make sure you discuss any questions you have with your health care provider. Document Revised: 10/08/2020 Document Reviewed: 10/08/2020 Elsevier Patient Education  2022 [employer].    If you have been instructed to have an in-person evaluation today at a local Urgent Care facility, please use the link below. It will take you to a list of all of our available [facility], including address, phone number and hours of operation. Please do not delay care.  [facility]  If  you or a family member do not have a primary care provider, use the link below to schedule a  visit and establish care. When you choose a [facility] primary care physician or advanced practice provider, you gain a long-term partner in health. Find a Primary Care Provider  Learn more about [facility] in-office and virtual care options: [facility]

## 2021-01-28 DIAGNOSIS — L97511 Non-pressure chronic ulcer of other part of right foot limited to breakdown of skin: Secondary | ICD-10-CM | POA: Diagnosis not present

## 2021-01-28 DIAGNOSIS — E11621 Type 2 diabetes mellitus with foot ulcer: Secondary | ICD-10-CM | POA: Diagnosis not present

## 2021-01-28 DIAGNOSIS — I5042 Chronic combined systolic (congestive) and diastolic (congestive) heart failure: Secondary | ICD-10-CM | POA: Diagnosis not present

## 2021-01-28 DIAGNOSIS — E785 Hyperlipidemia, unspecified: Secondary | ICD-10-CM | POA: Diagnosis not present

## 2021-02-04 ENCOUNTER — Ambulatory Visit (INDEPENDENT_AMBULATORY_CARE_PROVIDER_SITE_OTHER): Payer: Medicare Other

## 2021-02-04 DIAGNOSIS — I472 Ventricular tachycardia, unspecified: Secondary | ICD-10-CM

## 2021-02-07 LAB — CUP PACEART REMOTE DEVICE CHECK
Battery Remaining Longevity: 8 mo
Battery Voltage: 2.85 V
Brady Statistic AP VP Percent: 37.27 %
Brady Statistic AP VS Percent: 1.21 %
Brady Statistic AS VP Percent: 50.89 %
Brady Statistic AS VS Percent: 10.63 %
Brady Statistic RA Percent Paced: 35.47 %
Brady Statistic RV Percent Paced: 88.25 %
Date Time Interrogation Session: 20230106001703
HighPow Impedance: 40 Ohm
HighPow Impedance: 59 Ohm
Implantable Lead Implant Date: 20040309
Implantable Lead Implant Date: 20040309
Implantable Lead Location: 753859
Implantable Lead Location: 753860
Implantable Lead Model: 158
Implantable Lead Model: 5076
Implantable Lead Serial Number: 115061
Implantable Pulse Generator Implant Date: 20161007
Lead Channel Impedance Value: 399 Ohm
Lead Channel Impedance Value: 4047 Ohm
Lead Channel Impedance Value: 4047 Ohm
Lead Channel Impedance Value: 4047 Ohm
Lead Channel Impedance Value: 4047 Ohm
Lead Channel Impedance Value: 4047 Ohm
Lead Channel Impedance Value: 4047 Ohm
Lead Channel Impedance Value: 4047 Ohm
Lead Channel Impedance Value: 4047 Ohm
Lead Channel Impedance Value: 4047 Ohm
Lead Channel Impedance Value: 4047 Ohm
Lead Channel Impedance Value: 418 Ohm
Lead Channel Impedance Value: 456 Ohm
Lead Channel Pacing Threshold Amplitude: 0.5 V
Lead Channel Pacing Threshold Amplitude: 0.75 V
Lead Channel Pacing Threshold Pulse Width: 0.4 ms
Lead Channel Pacing Threshold Pulse Width: 0.4 ms
Lead Channel Sensing Intrinsic Amplitude: 2.5 mV
Lead Channel Sensing Intrinsic Amplitude: 2.5 mV
Lead Channel Sensing Intrinsic Amplitude: 6.5 mV
Lead Channel Sensing Intrinsic Amplitude: 6.5 mV
Lead Channel Setting Pacing Amplitude: 2 V
Lead Channel Setting Pacing Amplitude: 2.5 V
Lead Channel Setting Pacing Pulse Width: 0.4 ms
Lead Channel Setting Sensing Sensitivity: 0.3 mV

## 2021-02-13 ENCOUNTER — Encounter: Payer: Self-pay | Admitting: Internal Medicine

## 2021-02-14 NOTE — Progress Notes (Signed)
Remote ICD transmission.   

## 2021-02-28 DIAGNOSIS — E1159 Type 2 diabetes mellitus with other circulatory complications: Secondary | ICD-10-CM | POA: Diagnosis not present

## 2021-03-04 DIAGNOSIS — Z7984 Long term (current) use of oral hypoglycemic drugs: Secondary | ICD-10-CM | POA: Diagnosis not present

## 2021-03-04 DIAGNOSIS — E1159 Type 2 diabetes mellitus with other circulatory complications: Secondary | ICD-10-CM | POA: Diagnosis not present

## 2021-03-04 DIAGNOSIS — E1122 Type 2 diabetes mellitus with diabetic chronic kidney disease: Secondary | ICD-10-CM | POA: Diagnosis not present

## 2021-03-04 DIAGNOSIS — E113393 Type 2 diabetes mellitus with moderate nonproliferative diabetic retinopathy without macular edema, bilateral: Secondary | ICD-10-CM | POA: Diagnosis not present

## 2021-03-04 DIAGNOSIS — N182 Chronic kidney disease, stage 2 (mild): Secondary | ICD-10-CM | POA: Diagnosis not present

## 2021-03-16 ENCOUNTER — Other Ambulatory Visit (INDEPENDENT_AMBULATORY_CARE_PROVIDER_SITE_OTHER): Payer: Self-pay | Admitting: Emergency Medicine

## 2021-03-17 DIAGNOSIS — E1169 Type 2 diabetes mellitus with other specified complication: Secondary | ICD-10-CM | POA: Diagnosis not present

## 2021-03-17 DIAGNOSIS — E559 Vitamin D deficiency, unspecified: Secondary | ICD-10-CM | POA: Diagnosis not present

## 2021-03-17 DIAGNOSIS — J441 Chronic obstructive pulmonary disease with (acute) exacerbation: Secondary | ICD-10-CM | POA: Diagnosis not present

## 2021-03-17 DIAGNOSIS — I5022 Chronic systolic (congestive) heart failure: Secondary | ICD-10-CM | POA: Diagnosis not present

## 2021-03-17 DIAGNOSIS — D6869 Other thrombophilia: Secondary | ICD-10-CM | POA: Diagnosis not present

## 2021-03-17 DIAGNOSIS — I4581 Long QT syndrome: Secondary | ICD-10-CM | POA: Diagnosis not present

## 2021-03-17 DIAGNOSIS — Z1331 Encounter for screening for depression: Secondary | ICD-10-CM | POA: Diagnosis not present

## 2021-03-17 DIAGNOSIS — D696 Thrombocytopenia, unspecified: Secondary | ICD-10-CM | POA: Diagnosis not present

## 2021-03-17 DIAGNOSIS — I50810 Right heart failure, unspecified: Secondary | ICD-10-CM | POA: Diagnosis not present

## 2021-03-17 DIAGNOSIS — I480 Paroxysmal atrial fibrillation: Secondary | ICD-10-CM | POA: Diagnosis not present

## 2021-03-17 DIAGNOSIS — I700 Atherosclerosis of aorta: Secondary | ICD-10-CM | POA: Diagnosis not present

## 2021-03-17 DIAGNOSIS — Z1389 Encounter for screening for other disorder: Secondary | ICD-10-CM | POA: Diagnosis not present

## 2021-03-18 ENCOUNTER — Other Ambulatory Visit: Payer: Self-pay | Admitting: Internal Medicine

## 2021-03-18 ENCOUNTER — Other Ambulatory Visit (INDEPENDENT_AMBULATORY_CARE_PROVIDER_SITE_OTHER): Payer: Self-pay | Admitting: Emergency Medicine

## 2021-03-18 NOTE — Telephone Encounter (Signed)
Prescription refill request for Eliquis received. Indication: Atrial fib Last office visit: 11/08/20  Ezzie Dural MD Scr: 1.05 on 02/28/21 Age: 88 Weight: 88.7kg  Based on above findings Eliquis 5mg  twice daily is the appropriate dose.  Refill approved.

## 2021-03-29 DIAGNOSIS — I5042 Chronic combined systolic (congestive) and diastolic (congestive) heart failure: Secondary | ICD-10-CM | POA: Diagnosis not present

## 2021-03-29 DIAGNOSIS — E11621 Type 2 diabetes mellitus with foot ulcer: Secondary | ICD-10-CM | POA: Diagnosis not present

## 2021-03-29 DIAGNOSIS — E785 Hyperlipidemia, unspecified: Secondary | ICD-10-CM | POA: Diagnosis not present

## 2021-03-29 DIAGNOSIS — L97511 Non-pressure chronic ulcer of other part of right foot limited to breakdown of skin: Secondary | ICD-10-CM | POA: Diagnosis not present

## 2021-04-18 DIAGNOSIS — L570 Actinic keratosis: Secondary | ICD-10-CM | POA: Diagnosis not present

## 2021-04-18 DIAGNOSIS — Z85828 Personal history of other malignant neoplasm of skin: Secondary | ICD-10-CM | POA: Diagnosis not present

## 2021-04-18 DIAGNOSIS — D1801 Hemangioma of skin and subcutaneous tissue: Secondary | ICD-10-CM | POA: Diagnosis not present

## 2021-04-18 DIAGNOSIS — D692 Other nonthrombocytopenic purpura: Secondary | ICD-10-CM | POA: Diagnosis not present

## 2021-04-18 DIAGNOSIS — L821 Other seborrheic keratosis: Secondary | ICD-10-CM | POA: Diagnosis not present

## 2021-04-18 DIAGNOSIS — D2261 Melanocytic nevi of right upper limb, including shoulder: Secondary | ICD-10-CM | POA: Diagnosis not present

## 2021-04-18 DIAGNOSIS — D225 Melanocytic nevi of trunk: Secondary | ICD-10-CM | POA: Diagnosis not present

## 2021-04-28 DIAGNOSIS — E871 Hypo-osmolality and hyponatremia: Secondary | ICD-10-CM | POA: Diagnosis not present

## 2021-04-28 DIAGNOSIS — Z95810 Presence of automatic (implantable) cardiac defibrillator: Secondary | ICD-10-CM | POA: Diagnosis not present

## 2021-04-28 DIAGNOSIS — J449 Chronic obstructive pulmonary disease, unspecified: Secondary | ICD-10-CM | POA: Diagnosis not present

## 2021-04-28 DIAGNOSIS — I6529 Occlusion and stenosis of unspecified carotid artery: Secondary | ICD-10-CM | POA: Diagnosis not present

## 2021-04-28 DIAGNOSIS — I255 Ischemic cardiomyopathy: Secondary | ICD-10-CM | POA: Diagnosis not present

## 2021-04-28 DIAGNOSIS — I10 Essential (primary) hypertension: Secondary | ICD-10-CM | POA: Diagnosis not present

## 2021-04-28 DIAGNOSIS — I480 Paroxysmal atrial fibrillation: Secondary | ICD-10-CM | POA: Diagnosis not present

## 2021-04-28 DIAGNOSIS — E118 Type 2 diabetes mellitus with unspecified complications: Secondary | ICD-10-CM | POA: Diagnosis not present

## 2021-04-29 DIAGNOSIS — E785 Hyperlipidemia, unspecified: Secondary | ICD-10-CM | POA: Diagnosis not present

## 2021-04-29 DIAGNOSIS — I5042 Chronic combined systolic (congestive) and diastolic (congestive) heart failure: Secondary | ICD-10-CM | POA: Diagnosis not present

## 2021-04-29 DIAGNOSIS — E11621 Type 2 diabetes mellitus with foot ulcer: Secondary | ICD-10-CM | POA: Diagnosis not present

## 2021-04-29 DIAGNOSIS — L97511 Non-pressure chronic ulcer of other part of right foot limited to breakdown of skin: Secondary | ICD-10-CM | POA: Diagnosis not present

## 2021-05-06 ENCOUNTER — Ambulatory Visit (INDEPENDENT_AMBULATORY_CARE_PROVIDER_SITE_OTHER): Payer: Medicare Other

## 2021-05-06 DIAGNOSIS — I472 Ventricular tachycardia, unspecified: Secondary | ICD-10-CM | POA: Diagnosis not present

## 2021-05-09 ENCOUNTER — Other Ambulatory Visit: Payer: Self-pay | Admitting: Cardiovascular Disease

## 2021-05-09 DIAGNOSIS — I4891 Unspecified atrial fibrillation: Secondary | ICD-10-CM

## 2021-05-09 LAB — CUP PACEART REMOTE DEVICE CHECK
Battery Remaining Longevity: 5 mo
Battery Voltage: 2.81 V
Brady Statistic AP VP Percent: 30.87 %
Brady Statistic AP VS Percent: 0.92 %
Brady Statistic AS VP Percent: 56.36 %
Brady Statistic AS VS Percent: 11.85 %
Brady Statistic RA Percent Paced: 28.45 %
Brady Statistic RV Percent Paced: 87.56 %
Date Time Interrogation Session: 20230407151609
HighPow Impedance: 43 Ohm
HighPow Impedance: 64 Ohm
Implantable Lead Implant Date: 20040309
Implantable Lead Implant Date: 20040309
Implantable Lead Location: 753859
Implantable Lead Location: 753860
Implantable Lead Model: 158
Implantable Lead Model: 5076
Implantable Lead Serial Number: 115061
Implantable Pulse Generator Implant Date: 20161007
Lead Channel Impedance Value: 4047 Ohm
Lead Channel Impedance Value: 4047 Ohm
Lead Channel Impedance Value: 4047 Ohm
Lead Channel Impedance Value: 4047 Ohm
Lead Channel Impedance Value: 4047 Ohm
Lead Channel Impedance Value: 4047 Ohm
Lead Channel Impedance Value: 4047 Ohm
Lead Channel Impedance Value: 4047 Ohm
Lead Channel Impedance Value: 4047 Ohm
Lead Channel Impedance Value: 4047 Ohm
Lead Channel Impedance Value: 418 Ohm
Lead Channel Impedance Value: 456 Ohm
Lead Channel Impedance Value: 456 Ohm
Lead Channel Pacing Threshold Amplitude: 0.5 V
Lead Channel Pacing Threshold Amplitude: 0.625 V
Lead Channel Pacing Threshold Pulse Width: 0.4 ms
Lead Channel Pacing Threshold Pulse Width: 0.4 ms
Lead Channel Sensing Intrinsic Amplitude: 1.5 mV
Lead Channel Sensing Intrinsic Amplitude: 1.5 mV
Lead Channel Sensing Intrinsic Amplitude: 7.375 mV
Lead Channel Sensing Intrinsic Amplitude: 7.375 mV
Lead Channel Setting Pacing Amplitude: 2 V
Lead Channel Setting Pacing Amplitude: 2.5 V
Lead Channel Setting Pacing Pulse Width: 0.4 ms
Lead Channel Setting Sensing Sensitivity: 0.3 mV

## 2021-05-10 ENCOUNTER — Encounter: Payer: Self-pay | Admitting: Internal Medicine

## 2021-05-12 ENCOUNTER — Ambulatory Visit (INDEPENDENT_AMBULATORY_CARE_PROVIDER_SITE_OTHER): Payer: Medicare Other | Admitting: Internal Medicine

## 2021-05-12 ENCOUNTER — Encounter: Payer: Self-pay | Admitting: Internal Medicine

## 2021-05-12 VITALS — BP 98/62 | HR 80 | Ht 73.0 in | Wt 194.0 lb

## 2021-05-12 DIAGNOSIS — I4819 Other persistent atrial fibrillation: Secondary | ICD-10-CM | POA: Diagnosis not present

## 2021-05-12 DIAGNOSIS — I4729 Other ventricular tachycardia: Secondary | ICD-10-CM | POA: Diagnosis not present

## 2021-05-12 DIAGNOSIS — Z95810 Presence of automatic (implantable) cardiac defibrillator: Secondary | ICD-10-CM | POA: Diagnosis not present

## 2021-05-12 NOTE — Patient Instructions (Addendum)
Medication Instructions:  ?Your physician has recommended you make the following change in your medication:  ? ?** Stop Dofetilide ? ?*If you need a refill on your cardiac medications before your next appointment, please call your pharmacy* ? ? ?Lab Work: ?None ordered. ? ?If you have labs (blood work) drawn today and your tests are completely normal, you will receive your results only by: ?MyChart Message (if you have MyChart) OR ?A paper copy in the mail ?If you have any lab test that is abnormal or we need to change your treatment, we will call you to review the results. ? ? ?Testing/Procedures: ?None ordered. ? ? ? ?Follow-Up: ?At [facility], you and your health needs are our priority.  As part of our continuing mission to provide you with exceptional heart care, we have created designated Provider Care Teams.  These Care Teams include your primary Cardiologist (physician) and Advanced Practice Providers (APPs -  Physician Assistants and Nurse Practitioners) who all work together to provide you with the care you need, when you need it. ? ?We recommend signing up for the patient portal called "MyChart".  Sign up information is provided on this After Visit Summary.  MyChart is used to connect with patients for Virtual Visits (Telemedicine).  Patients are able to view lab/test results, encounter notes, upcoming appointments, etc.  Non-urgent messages can be sent to your provider as well.   ?To learn more about what you can do with MyChart, go to [email or web address].   ? ?Your next appointment:   ?5 months with Dr Caryl Comes as scheduled ? ?Important Information About [employer] ? ? ? ? ?  ?

## 2021-05-12 NOTE — Progress Notes (Signed)
Patient Care Team: ?Sueanne Margarita, DO as PCP - General (Internal Medicine) ?Sherren Mocha, MD as PCP - Cardiology (Cardiology) ?Deboraha Sprang, MD as PCP - Electrophysiology (Cardiology) ? ? ?HPI ? ?[name] is a 88 y.o. male ?Seen in followup for polymorphic ventricular tachycardia for which he has an ICD implantation 2004 with generator change 2010 and  2016 as well as persistent atrial fibrillation which was quite symptomatic and prompted a visit to the A. fib clinic with the plan for anticoagulation and TEE cardioversion.  Currently maintained on Eliquis and dofetilide ? ?Last device implanted with CRT but no LV lead was placed. ? ?History of coronary disease with prior CABG mitral valve repair both of which were done at [facility]  PFO closure at that time.  ?  ? ?AFib assoc with slowish Ventricular response but with dyspnea on exertion and fatigue.  He is quite aware of when he is in A. fib and when he is not. ? ?Interval issues include 1-none resolving right upper lobe pneumonia for which he underwent bronchoscopy 6/22 cytology negative ?2-expanding abdominal aortic aneurysm--repair in the past of been recommended.  Phone call note 06/21/2020 referred to repeat vascular surgery clinic evaluation for which I do not see a note  ? ?Lengthy discussion regarding his choosing not to pursue surgical relief of his AAA,  he is at peace with the potential of his dying suddenly. ?3-evaluation by general cardiology, coronary disease and heart failure quiescient. ? ?The patient denies chest pain, shortness of breath, nocturnal dyspnea, orthopnea or peripheral edema.  There have been no palpitations, lightheadedness or syncope.  ? ?Unaware of his atrial fibrillation. ? ?Antiarrhythmics Date Reason stopped  ?dofetilide 9/20  ongoing   ?Amiodarone / Tried years ago following CABG stopped but pt does not recall adverse effects  ? ?  ? ?DATE TEST EF   ?6/14 LHC 35-40% LIMA/ RIMA patent   ?2/17 Echo   30 %    ?12/18 LHC   LIMA-LAD and right radial graft-OM- patent ?proximal cx stenosis with negative FFR  ?11/19 Echo  35-40% LAE Severe (69/3.0/63) ?Pulm HTN 50  ?10/20 Echo  35-40% BAE  Pulm NTH 41 mm  ?4/22 Echo  35-40% BAE  PA sys 45  ?5/22 CTA-abd  AAA 6.2 <<5.7 cm   ? ? ? ?  ?Date Cr K Mg Hgb  ?9/16  0.99 4.2 2.1   ?12/17  1.08 4.6 2.1   ?5/19 1.00 4.5 2.2   ?10/19 1.04 4.4  13.2 (11/19)  ?1/21  0.9 4.3 1.9 13.4  ?2/22 1.04 4.5 1.9 12.2<<13.9  ?6/22 0.93 4.8  14.1  ?  ?  ? ?Device History: ?STJ dual chamber ICD implanted 2004 for PMVT, ICM; generator change 2010; gen change 2016 (MDT device) ?History of appropriate therapy: Yes ?History of AAD therapy: Yes ?Thromboembolic risk factors ( age  -2, HTN-1, TIA/CVA-2, Vasc disease -1, CHF-1) for a CHADSVASc Score of >=7 ? ? ?Past Medical History:  ?Diagnosis Date  ? Acute on chronic combined systolic (congestive) and diastolic (congestive) heart failure ([place])   ? AICD (automatic cardioverter/defibrillator) present   ? Med[employer]onicn PPM/ICD  ? Carotid stenosis, right   ? s/p endarterectomy 11/07/18  ? Chronic combined systolic (congestive) and diastolic (congestive) heart failure ([facility])   ? Coronary artery disease   ? [facility]  ? Diabetes mellitus   ? Dysrhythmia   ? Endocarditis, valve unspecified   ? Mitral  ? Headache   ?  hx migraines 6-7 x mo  ? History of migraine headaches   ? History of seasonal allergies   ? ICD (implantable cardiac defibrillator) in place   ? 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 OD, this could be a sign of microvascular disease of the optic nerve yet with normal intraocular pressure.  We will monitor this closely and look for changes over the ensuing 6 months.  Repeat visit here in 6 months  ? PAF (paroxysmal atrial fibrillation) ([place])   ? NO LAA occlusion at the time of surgery  [name] 2018  ? Pleural effusion   ? PVC's (premature ventricular contractions)   ? Ventricular tachycardia, polymorphic ([place])   ? status post  ICD implantation  ? ? ?Past Surgical History:  ?Procedure Laterality Date  ? BRONCHIAL BIOPSY  07/22/2020  ? Procedure: BRONCHIAL BIOPSIES;  Surgeon: Collene Gobble, MD;  Location: [facility];  Service: Cardiopulmonary;;  ? BRONCHIAL BRUSHINGS  07/22/2020  ? Procedure: BRONCHIAL BRUSHINGS;  Surgeon: Collene Gobble, MD;  Location: [facility];  Service: Cardiopulmonary;;  ? BRONCHIAL WASHINGS  07/22/2020  ? Procedure: BRONCHIAL WASHINGS;  Surgeon: Collene Gobble, MD;  Location: [facility];  Service: Cardiopulmonary;;  ? CARDIAC CATHETERIZATION  04/03/2007  ? EF 40%  ? CARDIAC CATHETERIZATION  02/06/2006  ? EF 45%. ANTERIOR HYPOKINESIS  ? CARDIAC CATHETERIZATION  05/29/2000  ? EF 50%. MILD ANTERIOR HYPOKINESIS  ? CARDIAC CATHETERIZATION  10/25/1999  ? EF 50%. SEVERE MITRAL REGURGITATION  ? CARDIAC CATHETERIZATION  01/06/2003  ? EF 50%  ? CARDIAC DEFIBRILLATOR PLACEMENT    ? CARDIOVERSION N/A 02/07/2019  ? Procedure: CARDIOVERSION;  Surgeon: Fay Records, MD;  Location: [place];  Service: Cardiovascular;  Laterality: N/A;  ? CARDIOVERSION N/A 11/24/2019  ? Procedure: CARDIOVERSION;  Surgeon: Josue Hector, MD;  Location: [facility] ENDOSCOPY;  Service: Cardiovascular;  Laterality: N/A;  ? CATARACT EXTRACTION W/ INTRAOCULAR LENS  IMPLANT, BILATERAL    ? CHOLECYSTECTOMY N/A 01/08/2019  ? Procedure: LAPAROSCOPIC CHOLECYSTECTOMY WITH INTRAOPERATIVE CHOLANGIOGRAM;  Surgeon: Donnie Mesa, MD;  Location: [place];  Service: General;  Laterality: N/A;  ? CORONARY ARTERY BYPASS GRAFT  2001  ? 2 VESSEL CABG AND MITRAL VALVE REPAIR. HE HAD A LIMA GRAFT TO THE LAD AND RADIAL ARTERY  GRAFT TO THE OBTUSE MARGINAL  OF LEFT CIRCUMFLEX  ? ENDARTERECTOMY Right 11/07/2018  ? Procedure: ENDARTERECTOMY CAROTID RIGHT;  Surgeon: Waynetta Sandy, MD;  Location: [place];  Service: Vascular;  Laterality: Right;  ? EP IMPLANTABLE DEVICE N/A 11/06/2014  ? Procedure: ICD Generator Changeout;  Surgeon: Deboraha Sprang, MD;  Location: [place] [facility];  Service: Cardiovascular;  Laterality: N/A;  ? ERCP N/A 01/10/2019  ? Procedure: ENDOSCOPIC RETROGRADE CHOLANGIOPANCREATOGRAPHY (ERCP);  Surgeon: Clarene Essex, MD;  Location: [place];  Service: Endoscopy;  Laterality: N/A;  ? FOREIGN BODY REMOVAL Right 06/21/2017  ? Procedure: FOREIGN BODY REMOVAL ADULT RIGHT RING FINGER;  Surgeon: Leanora Cover, MD;  Location: [place];  Service: Orthopedics;  Laterality: Right;  ? HEMORRHOID SURGERY    ? HEMORROIDECTOMY    ? INTRAVASCULAR PRESSURE WIRE/FFR STUDY N/A 01/05/2017  ? Procedure: INTRAVASCULAR PRESSURE WIRE/FFR STUDY;  Surgeon: Sherren Mocha, MD;  Location: [place] [facility];  Service: Cardiovascular;  Laterality: N/A;  ? KNEE ARTHROSCOPY    ? RIGHT KNEE  ? LAPAROSCOPIC APPENDECTOMY N/A 01/08/2019  ? Procedure: APPENDECTOMY LAPAROSCOPIC;  Surgeon: Donnie Mesa, MD;  Location: [place];  Service: General;  Laterality: N/A;  ? LEFT  HEART CATHETERIZATION WITH CORONARY ANGIOGRAM N/A 07/04/2012  ? Procedure: LEFT HEART CATHETERIZATION WITH CORONARY ANGIOGRAM;  Surgeon: Sherren Mocha, MD;  Location: [facility] CATH LAB;  Service: Cardiovascular;  Laterality: N/A;  ? MITRAL VALVE REPLACEMENT    ? No LAA occlusion at the time of surgery  Karie Soda report 2018  ? REMOVAL OF STONES  01/10/2019  ? Procedure: REMOVAL OF STONES;  Surgeon: Clarene Essex, MD;  Location: [place];  Service: Endoscopy;;  ? RIGHT/LEFT HEART CATH AND CORONARY/GRAFT ANGIOGRAPHY N/A 01/05/2017  ? Procedure: RIGHT/LEFT HEART CATH AND CORONARY/GRAFT ANGIOGRAPHY;  Surgeon: Sherren Mocha, MD;  Location: [place] [facility];  Service: Cardiovascular;  Laterality: N/A;  ? SPHINCTEROTOMY  01/10/2019  ? Procedure: SPHINCTEROTOMY;  Surgeon: Clarene Essex, MD;  Location: [place];  Service: Endoscopy;;  ? TRANSTHORACIC ECHOCARDIOGRAM  04/02/2007  ? EF 35%  ? VIDEO BRONCHOSCOPY Right 07/22/2020  ? Procedure: VIDEO BRONCHOSCOPY WITH FLUORO, BAL AND BIOPSY;  Surgeon: Collene Gobble, MD;  Location: [facility] END[facility];  Service:  Cardiopulmonary;  Laterality: Right;  CONSCIOUS SEDATION OR MAC  ? ? ?Current Outpatient Medications  ?Medication Sig Dispense Refill  ? albuterol (VENTOLIN HFA) 108 (90 Base) MCG/ACT inhaler Inhale 2 puffs i

## 2021-05-18 ENCOUNTER — Encounter: Payer: Self-pay | Admitting: Internal Medicine

## 2021-05-19 ENCOUNTER — Ambulatory Visit (HOSPITAL_COMMUNITY): Payer: Medicare Other | Admitting: Nurse Practitioner

## 2021-05-23 NOTE — Progress Notes (Signed)
Remote ICD transmission.   

## 2021-06-01 ENCOUNTER — Other Ambulatory Visit: Payer: Self-pay | Admitting: Cardiovascular Disease

## 2021-06-06 ENCOUNTER — Ambulatory Visit (INDEPENDENT_AMBULATORY_CARE_PROVIDER_SITE_OTHER): Payer: Medicare Other

## 2021-06-06 DIAGNOSIS — I4729 Other ventricular tachycardia: Secondary | ICD-10-CM

## 2021-06-07 LAB — CUP PACEART REMOTE DEVICE CHECK
Battery Remaining Longevity: 5 mo
Battery Voltage: 2.83 V
Brady Statistic RA Percent Paced: 0.69 %
Brady Statistic RV Percent Paced: 84.55 %
Date Time Interrogation Session: 20230508033621
HighPow Impedance: 44 Ohm
HighPow Impedance: 58 Ohm
Implantable Lead Implant Date: 20040309
Implantable Lead Implant Date: 20040309
Implantable Lead Location: 753859
Implantable Lead Location: 753860
Implantable Lead Model: 158
Implantable Lead Model: 5076
Implantable Lead Serial Number: 115061
Implantable Pulse Generator Implant Date: 20161007
Lead Channel Impedance Value: 4047 Ohm
Lead Channel Impedance Value: 4047 Ohm
Lead Channel Impedance Value: 4047 Ohm
Lead Channel Impedance Value: 4047 Ohm
Lead Channel Impedance Value: 4047 Ohm
Lead Channel Impedance Value: 4047 Ohm
Lead Channel Impedance Value: 4047 Ohm
Lead Channel Impedance Value: 4047 Ohm
Lead Channel Impedance Value: 4047 Ohm
Lead Channel Impedance Value: 4047 Ohm
Lead Channel Impedance Value: 418 Ohm
Lead Channel Impedance Value: 418 Ohm
Lead Channel Impedance Value: 456 Ohm
Lead Channel Pacing Threshold Amplitude: 0.5 V
Lead Channel Pacing Threshold Amplitude: 0.625 V
Lead Channel Pacing Threshold Pulse Width: 0.4 ms
Lead Channel Pacing Threshold Pulse Width: 0.4 ms
Lead Channel Sensing Intrinsic Amplitude: 1.25 mV
Lead Channel Sensing Intrinsic Amplitude: 1.25 mV
Lead Channel Sensing Intrinsic Amplitude: 7.375 mV
Lead Channel Sensing Intrinsic Amplitude: 7.375 mV
Lead Channel Setting Pacing Amplitude: 2 V
Lead Channel Setting Pacing Amplitude: 2.5 V
Lead Channel Setting Pacing Pulse Width: 0.4 ms
Lead Channel Setting Sensing Sensitivity: 0.3 mV

## 2021-06-23 NOTE — Progress Notes (Signed)
Remote ICD transmission.   

## 2021-06-23 NOTE — Addendum Note (Signed)
Addended by: Douglass Rivers D on: 06/23/2021 12:46 PM   Modules accepted: Level of Service

## 2021-07-11 ENCOUNTER — Ambulatory Visit (INDEPENDENT_AMBULATORY_CARE_PROVIDER_SITE_OTHER): Payer: Medicare Other

## 2021-07-11 DIAGNOSIS — I4729 Other ventricular tachycardia: Secondary | ICD-10-CM

## 2021-07-13 LAB — CUP PACEART REMOTE DEVICE CHECK
Battery Remaining Longevity: 4 mo
Battery Remaining Longevity: 4 mo
Battery Voltage: 2.81 V
Battery Voltage: 2.81 V
Brady Statistic AP VP Percent: 5.82 %
Brady Statistic AP VS Percent: 0.21 %
Brady Statistic AS VP Percent: 81.49 %
Brady Statistic AS VS Percent: 12.48 %
Brady Statistic RA Percent Paced: 5.13 %
Brady Statistic RA Percent Paced: 4.11 %
Brady Statistic RV Percent Paced: 88.58 %
Brady Statistic RV Percent Paced: 84.8 %
Date Time Interrogation Session: 20230612063037
Date Time Interrogation Session: 20230612132404
HighPow Impedance: 42 Ohm
HighPow Impedance: 56 Ohm
HighPow Impedance: 41 Ohm
HighPow Impedance: 61 Ohm
Implantable Lead Implant Date: 20040309
Implantable Lead Implant Date: 20040309
Implantable Lead Implant Date: 20040309
Implantable Lead Implant Date: 20040309
Implantable Lead Location: 753859
Implantable Lead Location: 753860
Implantable Lead Location: 753859
Implantable Lead Location: 753860
Implantable Lead Model: 158
Implantable Lead Model: 5076
Implantable Lead Model: 158
Implantable Lead Model: 5076
Implantable Lead Serial Number: 115061
Implantable Lead Serial Number: 115061
Implantable Pulse Generator Implant Date: 20161007
Implantable Pulse Generator Implant Date: 20161007
Lead Channel Impedance Value: 399 Ohm
Lead Channel Impedance Value: 4047 Ohm
Lead Channel Impedance Value: 4047 Ohm
Lead Channel Impedance Value: 4047 Ohm
Lead Channel Impedance Value: 4047 Ohm
Lead Channel Impedance Value: 4047 Ohm
Lead Channel Impedance Value: 4047 Ohm
Lead Channel Impedance Value: 4047 Ohm
Lead Channel Impedance Value: 4047 Ohm
Lead Channel Impedance Value: 4047 Ohm
Lead Channel Impedance Value: 4047 Ohm
Lead Channel Impedance Value: 456 Ohm
Lead Channel Impedance Value: 456 Ohm
Lead Channel Impedance Value: 4047 Ohm
Lead Channel Impedance Value: 4047 Ohm
Lead Channel Impedance Value: 4047 Ohm
Lead Channel Impedance Value: 4047 Ohm
Lead Channel Impedance Value: 4047 Ohm
Lead Channel Impedance Value: 4047 Ohm
Lead Channel Impedance Value: 4047 Ohm
Lead Channel Impedance Value: 4047 Ohm
Lead Channel Impedance Value: 4047 Ohm
Lead Channel Impedance Value: 4047 Ohm
Lead Channel Impedance Value: 418 Ohm
Lead Channel Impedance Value: 418 Ohm
Lead Channel Impedance Value: 418 Ohm
Lead Channel Pacing Threshold Amplitude: 0.5 V
Lead Channel Pacing Threshold Amplitude: 0.625 V
Lead Channel Pacing Threshold Amplitude: 0.5 V
Lead Channel Pacing Threshold Amplitude: 0.625 V
Lead Channel Pacing Threshold Pulse Width: 0.4 ms
Lead Channel Pacing Threshold Pulse Width: 0.4 ms
Lead Channel Pacing Threshold Pulse Width: 0.4 ms
Lead Channel Pacing Threshold Pulse Width: 0.4 ms
Lead Channel Sensing Intrinsic Amplitude: 1.875 mV
Lead Channel Sensing Intrinsic Amplitude: 1.875 mV
Lead Channel Sensing Intrinsic Amplitude: 7.75 mV
Lead Channel Sensing Intrinsic Amplitude: 7.75 mV
Lead Channel Sensing Intrinsic Amplitude: 1.875 mV
Lead Channel Sensing Intrinsic Amplitude: 1.875 mV
Lead Channel Sensing Intrinsic Amplitude: 7.75 mV
Lead Channel Sensing Intrinsic Amplitude: 7.75 mV
Lead Channel Setting Pacing Amplitude: 2 V
Lead Channel Setting Pacing Amplitude: 2.5 V
Lead Channel Setting Pacing Amplitude: 2 V
Lead Channel Setting Pacing Amplitude: 2.5 V
Lead Channel Setting Pacing Pulse Width: 0.4 ms
Lead Channel Setting Pacing Pulse Width: 0.4 ms
Lead Channel Setting Sensing Sensitivity: 0.3 mV
Lead Channel Setting Sensing Sensitivity: 0.3 mV

## 2021-07-27 NOTE — Addendum Note (Signed)
Addended by: Douglass Rivers D on: 07/27/2021 09:44 AM   Modules accepted: Level of Service

## 2021-07-27 NOTE — Progress Notes (Signed)
Remote ICD transmission.   

## 2021-07-28 ENCOUNTER — Telehealth (HOSPITAL_COMMUNITY): Payer: Self-pay | Admitting: Pharmacy Technician

## 2021-07-28 NOTE — Telephone Encounter (Signed)
Patient Advocate Encounter  Prior Authorization for Aimovig '70MG'$ /ML auto-injectors has been approved.     Effective dates: 07/28/2021 through 07/29/2022      Lyndel Safe, [place] Patient Advocate Specialist [place] Patient Advocate Team Direct Number: [phone]  Fax: [phone]

## 2021-07-28 NOTE — Telephone Encounter (Signed)
Patient Advocate Encounter   Received notification that prior authorization for Aimovig '70MG'$ /ML auto-injectors is required.   PA submitted on 07/28/2021 Key [id number] Status is pending       Lyndel Safe, [place] Patient Advocate Specialist [place] Patient Advocate Team Direct Number: [phone]  Fax: [phone]

## 2021-07-29 DIAGNOSIS — L97511 Non-pressure chronic ulcer of other part of right foot limited to breakdown of skin: Secondary | ICD-10-CM | POA: Diagnosis not present

## 2021-07-29 DIAGNOSIS — I5042 Chronic combined systolic (congestive) and diastolic (congestive) heart failure: Secondary | ICD-10-CM | POA: Diagnosis not present

## 2021-07-29 DIAGNOSIS — E785 Hyperlipidemia, unspecified: Secondary | ICD-10-CM | POA: Diagnosis not present

## 2021-07-29 DIAGNOSIS — E11621 Type 2 diabetes mellitus with foot ulcer: Secondary | ICD-10-CM | POA: Diagnosis not present

## 2021-08-05 ENCOUNTER — Ambulatory Visit (INDEPENDENT_AMBULATORY_CARE_PROVIDER_SITE_OTHER): Payer: Medicare Other

## 2021-08-05 DIAGNOSIS — I4729 Other ventricular tachycardia: Secondary | ICD-10-CM | POA: Diagnosis not present

## 2021-08-06 LAB — CUP PACEART REMOTE DEVICE CHECK
Battery Remaining Longevity: 4 mo
Battery Remaining Longevity: 4 mo
Battery Voltage: 2.79 V
Battery Voltage: 2.79 V
Brady Statistic AP VP Percent: 15.42 %
Brady Statistic AP VS Percent: 0.7 %
Brady Statistic AS VP Percent: 73.24 %
Brady Statistic AS VS Percent: 10.64 %
Brady Statistic RA Percent Paced: 14.22 %
Brady Statistic RA Percent Paced: 3.51 %
Brady Statistic RV Percent Paced: 89.18 %
Brady Statistic RV Percent Paced: 94.16 %
Date Time Interrogation Session: 20230707043722
Date Time Interrogation Session: 20230707065936
HighPow Impedance: 42 Ohm
HighPow Impedance: 59 Ohm
HighPow Impedance: 42 Ohm
HighPow Impedance: 59 Ohm
Implantable Lead Implant Date: 20040309
Implantable Lead Implant Date: 20040309
Implantable Lead Implant Date: 20040309
Implantable Lead Implant Date: 20040309
Implantable Lead Location: 753859
Implantable Lead Location: 753860
Implantable Lead Location: 753859
Implantable Lead Location: 753860
Implantable Lead Model: 158
Implantable Lead Model: 5076
Implantable Lead Model: 158
Implantable Lead Model: 5076
Implantable Lead Serial Number: 115061
Implantable Lead Serial Number: 115061
Implantable Pulse Generator Implant Date: 20161007
Implantable Pulse Generator Implant Date: 20161007
Lead Channel Impedance Value: 399 Ohm
Lead Channel Impedance Value: 4047 Ohm
Lead Channel Impedance Value: 4047 Ohm
Lead Channel Impedance Value: 4047 Ohm
Lead Channel Impedance Value: 4047 Ohm
Lead Channel Impedance Value: 4047 Ohm
Lead Channel Impedance Value: 4047 Ohm
Lead Channel Impedance Value: 4047 Ohm
Lead Channel Impedance Value: 4047 Ohm
Lead Channel Impedance Value: 4047 Ohm
Lead Channel Impedance Value: 4047 Ohm
Lead Channel Impedance Value: 418 Ohm
Lead Channel Impedance Value: 418 Ohm
Lead Channel Impedance Value: 399 Ohm
Lead Channel Impedance Value: 4047 Ohm
Lead Channel Impedance Value: 4047 Ohm
Lead Channel Impedance Value: 4047 Ohm
Lead Channel Impedance Value: 4047 Ohm
Lead Channel Impedance Value: 4047 Ohm
Lead Channel Impedance Value: 4047 Ohm
Lead Channel Impedance Value: 4047 Ohm
Lead Channel Impedance Value: 4047 Ohm
Lead Channel Impedance Value: 4047 Ohm
Lead Channel Impedance Value: 4047 Ohm
Lead Channel Impedance Value: 418 Ohm
Lead Channel Impedance Value: 418 Ohm
Lead Channel Pacing Threshold Amplitude: 0.625 V
Lead Channel Pacing Threshold Amplitude: 0.625 V
Lead Channel Pacing Threshold Amplitude: 0.5 V
Lead Channel Pacing Threshold Amplitude: 0.625 V
Lead Channel Pacing Threshold Pulse Width: 0.4 ms
Lead Channel Pacing Threshold Pulse Width: 0.4 ms
Lead Channel Pacing Threshold Pulse Width: 0.4 ms
Lead Channel Pacing Threshold Pulse Width: 0.4 ms
Lead Channel Sensing Intrinsic Amplitude: 3.25 mV
Lead Channel Sensing Intrinsic Amplitude: 3.25 mV
Lead Channel Sensing Intrinsic Amplitude: 6.875 mV
Lead Channel Sensing Intrinsic Amplitude: 6.875 mV
Lead Channel Sensing Intrinsic Amplitude: 3.25 mV
Lead Channel Sensing Intrinsic Amplitude: 3.25 mV
Lead Channel Sensing Intrinsic Amplitude: 6.875 mV
Lead Channel Sensing Intrinsic Amplitude: 6.875 mV
Lead Channel Setting Pacing Amplitude: 2 V
Lead Channel Setting Pacing Amplitude: 2.5 V
Lead Channel Setting Pacing Amplitude: 2 V
Lead Channel Setting Pacing Amplitude: 2.5 V
Lead Channel Setting Pacing Pulse Width: 0.4 ms
Lead Channel Setting Pacing Pulse Width: 0.4 ms
Lead Channel Setting Sensing Sensitivity: 0.3 mV
Lead Channel Setting Sensing Sensitivity: 0.3 mV

## 2021-08-22 NOTE — Progress Notes (Signed)
Remote ICD transmission.   

## 2021-08-30 ENCOUNTER — Ambulatory Visit (INDEPENDENT_AMBULATORY_CARE_PROVIDER_SITE_OTHER): Payer: Medicare Other | Admitting: Ophthalmology

## 2021-08-30 ENCOUNTER — Encounter (INDEPENDENT_AMBULATORY_CARE_PROVIDER_SITE_OTHER): Payer: Self-pay | Admitting: Ophthalmology

## 2021-08-30 DIAGNOSIS — E113393 Type 2 diabetes mellitus with moderate nonproliferative diabetic retinopathy without macular edema, bilateral: Secondary | ICD-10-CM

## 2021-08-30 DIAGNOSIS — H353131 Nonexudative age-related macular degeneration, bilateral, early dry stage: Secondary | ICD-10-CM | POA: Diagnosis not present

## 2021-08-30 NOTE — Assessment & Plan Note (Signed)
Very early does not rise to the level of intermediate ARMD nor of geographic AMD

## 2021-08-30 NOTE — Progress Notes (Signed)
08/30/2021     CHIEF COMPLAINT Patient presents for  Chief Complaint  Patient presents with   Diabetic Retinopathy without Macular Edema      HISTORY OF PRESENT ILLNESS: [name] is a 88 y.o. male who presents to the clinic today for:   HPI   Pt states his vision has been stable  Pt denies any new floaters or FOL  Pt states his A1C is 6.8 and suger is 114 Last edited by [name], [name] on 08/30/2021 12:57 PM.      Referring physician: Sueanne Margarita, [name] [address] [place],  [place] [address]  HISTORICAL INFORMATION:   Selected notes from the [facility]    Lab Results  Component Value Date   HGBA1C 6.8 (H) 11/04/2018     CURRENT MEDICATIONS: No current outpatient medications on file. (Ophthalmic Drugs)   No current facility-administered medications for this visit. (Ophthalmic Drugs)   Current Outpatient Medications (Other)  Medication Sig   albuterol (VENTOLIN HFA) 108 (90 Base) MCG/ACT inhaler Inhale 2 puffs into the lungs every 6 (six) hours as needed for wheezing or shortness of breath.   atorvastatin (LIPITOR) 80 MG tablet TAKE ONE TABLET BY MOUTH EVERY DAY FOR CHOLESTEROL   cetirizine (ZYRTEC) 10 MG tablet Take 10 mg by mouth in the morning.   Cholecalciferol (VITAMIN D) 125 MCG (5000 UT) CAPS Take 5,000-10,000 Units by mouth See admin instructions. Take 1 capsule (5000 units) by mouth on Tuesdays, Thursdays, Saturdays, & Sundays at bedtime. Take 2 capsules (10000 units) by mouth on Mondays, Wednesdays, & Fridays at bedtime.   ELIQUIS 5 MG TABS tablet TAKE ONE TABLET BY MOUTH TWICE DAILY   empagliflozin (JARDIANCE) 10 MG TABS tablet Take 10 mg by mouth in the morning.   EPIPEN 2-PAK 0.3 MG/0.3ML SOAJ injection Inject 0.3 mg into the muscle daily as needed for anaphylaxis.    Erenumab-aooe (AIMOVIG) 140 MG/ML SOAJ Inject 140 mg into the skin every 28 (twenty-eight) days.   fluticasone (FLONASE) 50 MCG/ACT nasal spray Place 1 spray into the  nose daily as needed for rhinitis or allergies.   ibuprofen (ADVIL) 200 MG tablet Take 400 mg by mouth every 8 (eight) hours as needed (headaches/migraine).   Magnesium 250 MG TABS Take 250 mg by mouth in the morning.   metoprolol succinate (TOPROL-XL) 25 MG 24 hr tablet TAKE ONE TABLET BY MOUTH TWICE DAILY WITH OR IMMEDIATELY FOLLOWING A MEAL   Multiple Vitamin (MULTIVITAMIN WITH MINERALS) TABS tablet Take 1 tablet by mouth at bedtime. Centrum Silver   nateglinide (STARLIX) 60 MG tablet Take 1 tablet twice a day before meals for diabetes   sitaGLIPtin (JANUVIA) 100 MG tablet Take 100 mg by mouth at bedtime.   Tiotropium Bromide-Olodaterol (STIOLTO RESPIMAT) 2.5-2.5 MCG/ACT AERS Inhale 2 puffs into the lungs at bedtime.   No current facility-administered medications for this visit. (Other)      REVIEW OF SYSTEMS: ROS   Negative for: Constitutional, Gastrointestinal, Neurological, Skin, Genitourinary, Musculoskeletal, HENT, Endocrine, Cardiovascular, Eyes, Respiratory, Psychiatric, Allergic/Imm, Heme/Lymph Last edited by [name], [name] on 08/30/2021 12:57 PM.       ALLERGIES Allergies  Allergen Reactions   Bee Venom Anaphylaxis   Latex Other (See Comments)    REDNESS AT REACTION SITE.   Metformin Diarrhea   Rivaroxaban Other (See Comments)    Headaches, blurred vision   Sotalol Other (See Comments)    "BLUE TOES"- cut his circulation off   Amiodarone  Other reaction(s): rash, doesn't sound like DRESS syndrome.   Levofloxacin     Other reaction(s): long QT syndrome, so avoid   Warfarin Sodium Other (See Comments)    REACTION: migraine headaches/vision impariment    PAST MEDICAL HISTORY Past Medical History:  Diagnosis Date   Acute on chronic combined systolic (congestive) and diastolic (congestive) heart failure (HCC)    AICD (automatic cardioverter/defibrillator) present    [employer]n PPM/ICD   Carotid stenosis, right    s/p endarterectomy 11/07/18   Chronic  combined systolic (congestive) and diastolic (congestive) heart failure (HCC)    Coronary artery disease    [facility]   Diabetes mellitus    Dysrhythmia    Endocarditis, valve unspecified    Mitral   Headache    hx migraines 6-7 x mo   History of migraine headaches    History of seasonal allergies    ICD (implantable cardiac defibrillator) in place    Optic nerve hemorrhage, 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  We will monitor this closely and look for changes over the ensuing 6 months.  Repeat visit here in 6 months   PAF (paroxysmal atrial fibrillation) (HCC)    NO LAA occlusion at the time of surgery  [name] 2018   Pleural effusion    PVC's (premature ventricular contractions)    Ventricular tachycardia, polymorphic ([place])    status post ICD implantation   Past Surgical History:  Procedure Laterality Date   BRONCHIAL BIOPSY  07/22/2020   Procedure: BRONCHIAL BIOPSIES;  Surgeon: Collene Gobble, MD;  Location: [facility];  Service: Cardiopulmonary;;   BRONCHIAL BRUSHINGS  07/22/2020   Procedure: BRONCHIAL BRUSHINGS;  Surgeon: Collene Gobble, MD;  Location: [facility];  Service: Cardiopulmonary;;   BRONCHIAL WASHINGS  07/22/2020   Procedure: BRONCHIAL WASHINGS;  Surgeon: Collene Gobble, MD;  Location: [facility];  Service: Cardiopulmonary;;   CARDIAC CATHETERIZATION  04/03/2007   EF 40%   CARDIAC CATHETERIZATION  02/06/2006   EF 45%. ANTERIOR HYPOKINESIS   CARDIAC CATHETERIZATION  05/29/2000   EF 50%. MILD ANTERIOR HYPOKINESIS   CARDIAC CATHETERIZATION  10/25/1999   EF 50%. SEVERE MITRAL REGURGITATION   CARDIAC CATHETERIZATION  01/06/2003   EF 50%   CARDIAC DEFIBRILLATOR PLACEMENT     CARDIOVERSION N/A 02/07/2019   Procedure: CARDIOVERSION;  Surgeon: Fay Records, MD;  Location: [facility]COPY;  Service: Cardiovascular;  Laterality: N/A;   CARDIOVERSION N/A  11/24/2019   Procedure: CARDIOVERSION;  Surgeon: Josue Hector, MD;  Location: [facility] ENDOSCOPY;  Service: Cardiovascular;  Laterality: N/A;   CATARACT EXTRACTION W/ INTRAOCULAR LENS  IMPLANT, BILATERAL     CHOLECYSTECTOMY N/A 01/08/2019   Procedure: LAPAROSCOPIC CHOLECYSTECTOMY WITH INTRAOPERATIVE CHOLANGIOGRAM;  Surgeon: Donnie Mesa, MD;  Location: [facility];  Service: General;  Laterality: N/A;   CORONARY ARTERY BYPASS GRAFT  2001   2 VESSEL CABG AND MITRAL VALVE REPAIR. HE HAD A LIMA GRAFT TO THE LAD AND RADIAL ARTERY  GRAFT TO THE OBTUSE MARGINAL  OF LEFT CIRCUMFLEX   ENDARTERECTOMY Right 11/07/2018   Procedure: ENDARTERECTOMY CAROTID RIGHT;  Surgeon: Waynetta Sandy, MD;  Location: [facility];  Service: Vascular;  Laterality: Right;   EP IMPLANTABLE DEVICE N/A 11/06/2014   Procedure: ICD Generator Changeout;  Surgeon: Deboraha Sprang, MD;  Location: [place] CV LAB;  Service: Cardiovascular;  Laterality: N/A;   ERCP N/A 01/10/2019  Procedure: ENDOSCOPIC RETROGRADE CHOLANGIOPANCREATOGRAPHY (ERCP);  Surgeon: Clarene Essex, MD;  Location: [facility];  Service: Endoscopy;  Laterality: N/A;   FOREIGN BODY REMOVAL Right 06/21/2017   Procedure: FOREIGN BODY REMOVAL ADULT RIGHT RING FINGER;  Surgeon: Leanora Cover, MD;  Location: [facility];  Service: Orthopedics;  Laterality: Right;   HEMORRHOID SURGERY     HEMORROIDECTOMY     INTRAVASCULAR PRESSURE WIRE/FFR STUDY N/A 01/05/2017   Procedure: INTRAVASCULAR PRESSURE WIRE/FFR STUDY;  Surgeon: Sherren Mocha, MD;  Location: [facility] CV LAB;  Service: Cardiovascular;  Laterality: N/A;   KNEE ARTHROSCOPY     RIGHT KNEE   LAPAROSCOPIC APPENDECTOMY N/A 01/08/2019   Procedure: APPENDECTOMY LAPAROSCOPIC;  Surgeon: Donnie Mesa, MD;  Location: [facility];  Service: General;  Laterality: N/A;   LEFT HEART CATHETERIZATION WITH CORONARY ANGIOGRAM N/A 07/04/2012   Procedure: LEFT HEART CATHETERIZATION WITH CORONARY ANGIOGRAM;  Surgeon: Sherren Mocha, MD;  Location:  [facility] CATH LAB;  Service: Cardiovascular;  Laterality: N/A;   MITRAL VALVE REPLACEMENT     No LAA occlusion at the time of surgery  Karie Soda report 2018   REMOVAL OF STONES  01/10/2019   Procedure: REMOVAL OF STONES;  Surgeon: Clarene Essex, MD;  Location: [facility];  Service: Endoscopy;;   RIGHT/LEFT HEART CATH AND CORONARY/GRAFT ANGIOGRAPHY N/A 01/05/2017   Procedure: RIGHT/LEFT HEART CATH AND CORONARY/GRAFT ANGIOGRAPHY;  Surgeon: Sherren Mocha, MD;  Location: [place] CV LAB;  Service: Cardiovascular;  Laterality: N/A;   SPHINCTEROTOMY  01/10/2019   Procedure: SPHINCTEROTOMY;  Surgeon: Clarene Essex, MD;  Location: [facility];  Service: Endoscopy;;   TRANSTHORACIC ECHOCARDIOGRAM  04/02/2007   EF 35%   VIDEO BRONCHOSCOPY Right 07/22/2020   Procedure: VIDEO BRONCHOSCOPY WITH FLUORO, BAL AND BIOPSY;  Surgeon: Collene Gobble, MD;  Location: [facility];  Service: Cardiopulmonary;  Laterality: Right;  CONSCIOUS SEDATION OR MAC    FAMILY HISTORY Family History  Problem Relation Age of Onset   Heart disease Father    Emphysema Father    Stroke Mother    Cancer Sister        breast cancer   Heart disease Paternal Grandmother    Heart disease Paternal Grandfather    Diabetes Other        grandmother, aunt, uncle   Cancer Other        lung cancer, aunt    SOCIAL HISTORY Social History   Tobacco Use   Smoking status: Former    Packs/day: 2.00    Years: 16.00    Total pack years: 32.00    Types: Cigarettes    Start date: 02/23/1956    Quit date: 01/31/1971    Years since quitting: 50.6   Smokeless tobacco: Never  Vaping Use   Vaping Use: Never used  Substance Use Topics   Alcohol use: Yes    Alcohol/week: 0.0 standard drinks of alcohol    Comment: 1-2 a week   Drug use: No         OPHTHALMIC EXAM:  Base Eye Exam     Visual Acuity (ETDRS)       Right Left   Dist [place] 20/30 20/25         Tonometry (Tonopen, 1:03 PM)       Right Left   Pressure 6 6          Neuro/Psych     Oriented x3: Yes   Mood/Affect: Normal         Dilation     Both eyes: 1.0%  Mydriacyl, 2.5% Phenylephrine @ 12:58 PM           Slit Lamp and Fundus Exam     External Exam       Right Left   External Normal Normal         Slit Lamp Exam       Right Left   Lids/Lashes Normal Normal   Conjunctiva/Sclera White and quiet White and quiet   Cornea Clear Clear   Anterior Chamber Deep and quiet Deep and quiet   Iris Round and reactive Round and reactive   Lens Posterior chamber intraocular lens, Open posterior capsule Posterior chamber intraocular lens, Open posterior capsule   Anterior Vitreous Normal Normal         Fundus Exam       Right Left   Posterior Vitreous Posterior vitreous detachment Posterior vitreous detachment   Disc Normal Normal   C/D Ratio 0.35 0.5   Macula ,, Microaneurysms, no macular thickening, no clinically significant macular edema ,, Microaneurysms, no macular thickening, no clinically significant macular edema   Vessels NPDR- Moderate NPDR- Moderate   Periphery Pavingstone degeneration, Pigmentation Pavingstone degeneration, Pigmentation            IMAGING AND PROCEDURES  Imaging and Procedures for 08/30/21  OCT, Retina - OU - Both Eyes       Right Eye Quality was good. Scan locations included subfoveal. Central Foveal Thickness: 286. Progression has been stable. Findings include normal foveal contour.   Left Eye Quality was good. Scan locations included subfoveal. Central Foveal Thickness: 274. Progression has been stable. Findings include normal foveal contour.   Notes Normal macular contour OU incidental PVD OU.     Color Fundus Photography Optos - OU - Both Eyes       Right Eye Progression has been stable. Disc findings include hemorrhage. Macula : normal observations. Vessels : normal observations. Periphery : RPE abnormality.   Left Eye Progression has been stable. Disc findings include normal  observations. Macula : normal observations. Vessels : normal observations. Periphery : RPE abnormality.   Notes  Incidental posterior vitreous detachment OU.  Moderate nonproliferative diabetic retinopathy with otherwise reticulated RPE degeneration in the mid periphery, OU             ASSESSMENT/PLAN:  Moderate nonproliferative diabetic retinopathy of both eyes (HCC) Moderate NPDR OU.  No progression.  Doing very well  Early stage nonexudative age-related macular degeneration of both eyes Very early does not rise to the level of intermediate ARMD nor of geographic AMD     ICD-10-CM   1. Moderate nonproliferative diabetic retinopathy of both eyes associated with type 2 diabetes mellitus, macular edema presence unspecified (HCC)  A76.8115 OCT, Retina - OU - Both Eyes    Color Fundus Photography Optos - OU - Both Eyes    2. Early stage nonexudative age-related macular degeneration of both eyes  H35.3131       1.  Moderate NPDR OU, with stable findings.  Good blood sugar control continue on 40-monthfollow-up intervals  2.  Very early AMD, no high risk features no intermediate AMD issues.  Does not qualify for needing vitamin supplementation  3.  Ophthalmic Meds Ordered this visit:  No orders of the defined types were placed in this encounter.      Return in about 9 months (around 05/31/2022) for DILATE OU.  There are no Patient Instructions on file for this visit.   Explained the diagnoses, plan, and follow up with  the patient and they expressed understanding.  Patient expressed understanding of the importance of proper follow up care.   [name] M.D. Diseases & Surgery of the Retina and Vitreous Retina & Diabetic [place] 08/30/21     Abbreviations: M myopia (nearsighted); A astigmatism; H hyperopia (farsighted); P presbyopia; Mrx spectacle prescription;  CTL contact lenses; OD right eye; OS left eye; OU both eyes  XT exotropia; ET esotropia; PEK punctate  epithelial keratitis; PEE punctate epithelial erosions; DES dry eye syndrome; MGD meibomian gland dysfunction; ATs artificial tears; PFAT's preservative free artificial tears; [place] nuclear sclerotic cataract; PSC posterior subcapsular cataract; ERM epi-retinal membrane; PVD posterior vitreous detachment; RD retinal detachment; DM diabetes mellitus; DR diabetic retinopathy; NPDR non-proliferative diabetic retinopathy; PDR proliferative diabetic retinopathy; CSME clinically significant macular edema; DME diabetic macular edema; dbh dot blot hemorrhages; CWS cotton wool spot; POAG primary open angle glaucoma; C/D cup-to-disc ratio; HVF humphrey visual field; GVF goldmann visual field; OCT optical coherence tomography; IOP intraocular pressure; BRVO Branch retinal vein occlusion; CRVO central retinal vein occlusion; CRAO central retinal artery occlusion; BRAO branch retinal artery occlusion; RT retinal tear; SB scleral buckle; PPV pars plana vitrectomy; VH Vitreous hemorrhage; PRP panretinal laser photocoagulation; IVK intravitreal kenalog; VMT vitreomacular traction; MH Macular hole;  NVD neovascularization of the disc; NVE neovascularization elsewhere; AREDS age related eye disease study; ARMD age related macular degeneration; POAG primary open angle glaucoma; EBMD epithelial/anterior basement membrane dystrophy; ACIOL anterior chamber intraocular lens; IOL intraocular lens; PCIOL posterior chamber intraocular lens; Phaco/IOL phacoemulsification with intraocular lens placement; [place] photorefractive keratectomy; LASIK laser assisted in situ keratomileusis; HTN hypertension; DM diabetes mellitus; COPD chronic obstructive pulmonary disease

## 2021-08-30 NOTE — Assessment & Plan Note (Signed)
Moderate NPDR OU.  No progression.  Doing very well

## 2021-08-31 DIAGNOSIS — I480 Paroxysmal atrial fibrillation: Secondary | ICD-10-CM | POA: Diagnosis not present

## 2021-08-31 DIAGNOSIS — E113393 Type 2 diabetes mellitus with moderate nonproliferative diabetic retinopathy without macular edema, bilateral: Secondary | ICD-10-CM | POA: Diagnosis not present

## 2021-08-31 DIAGNOSIS — E1122 Type 2 diabetes mellitus with diabetic chronic kidney disease: Secondary | ICD-10-CM | POA: Diagnosis not present

## 2021-08-31 DIAGNOSIS — E1169 Type 2 diabetes mellitus with other specified complication: Secondary | ICD-10-CM | POA: Diagnosis not present

## 2021-08-31 DIAGNOSIS — E785 Hyperlipidemia, unspecified: Secondary | ICD-10-CM | POA: Diagnosis not present

## 2021-08-31 DIAGNOSIS — E1159 Type 2 diabetes mellitus with other circulatory complications: Secondary | ICD-10-CM | POA: Diagnosis not present

## 2021-08-31 DIAGNOSIS — N182 Chronic kidney disease, stage 2 (mild): Secondary | ICD-10-CM | POA: Diagnosis not present

## 2021-08-31 DIAGNOSIS — E559 Vitamin D deficiency, unspecified: Secondary | ICD-10-CM | POA: Diagnosis not present

## 2021-09-01 IMAGING — CT CT ANGIO CHEST-ABD-PELV FOR DISSECTION W/ AND WO/W CM
2 of 8 series · 12 of 46 positions shown, 14 images · IV contrast (APPLIED)
Comparison: Chest radiograph dated 05/18/2020 and CT abdomen pelvis
dated 01/06/2019. comparison is also made to the CT angiography
dated 01/03/2019.

CLINICAL DATA: 88-year-old male with abdominal aortic aneurysm.
Concern for dissection.

EXAM:
CT ANGIOGRAPHY CHEST, ABDOMEN AND PELVIS
TECHNIQUE: Non-contrast CT of the chest was initially obtained.

[Series 7: axial arterial · axial · arterial · 0.98mm/px · z∈[-440,+172]mm · 9 of 250 slices shown, 11 images]
[im 23/250  soft-tissue]
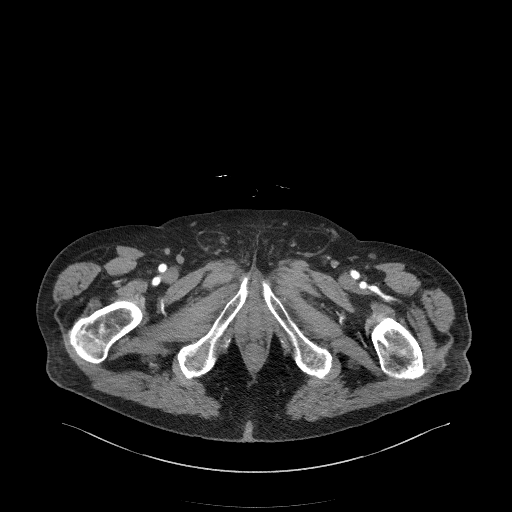
[im 23/250  bone]
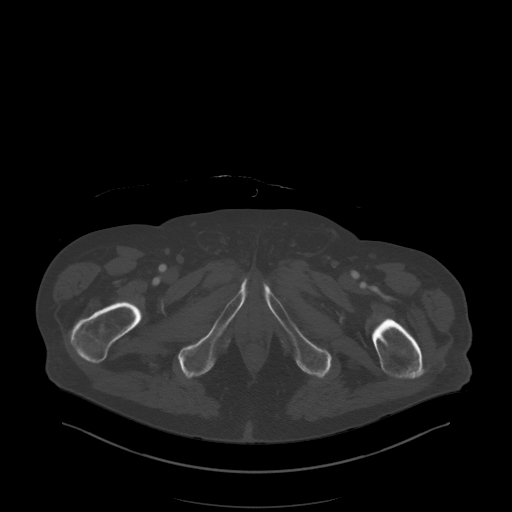
[im 46/250  soft-tissue]
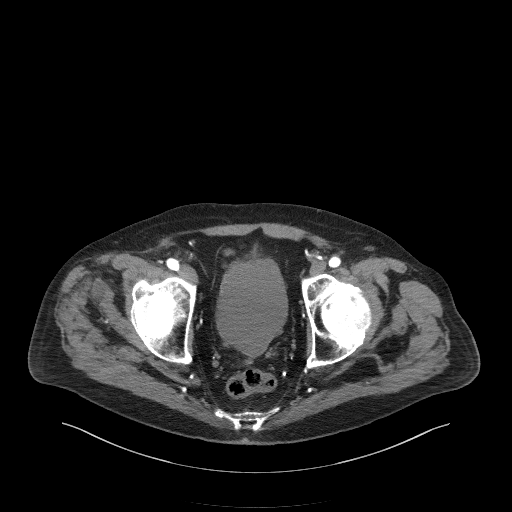
[im 68/250  soft-tissue]
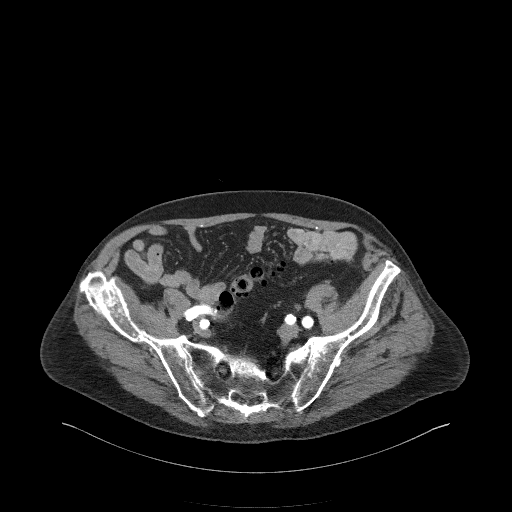
[im 91/250  soft-tissue]
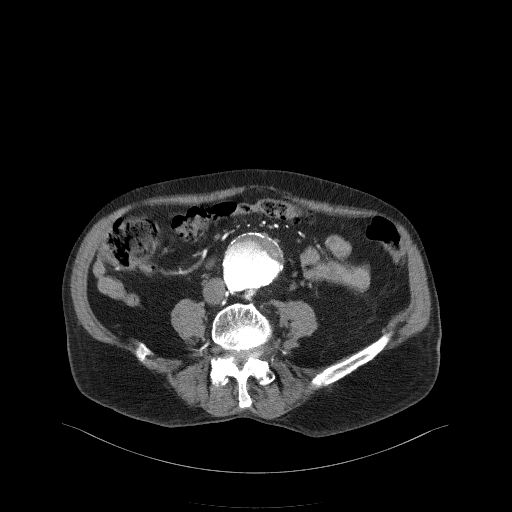
[im 136/250  soft-tissue]
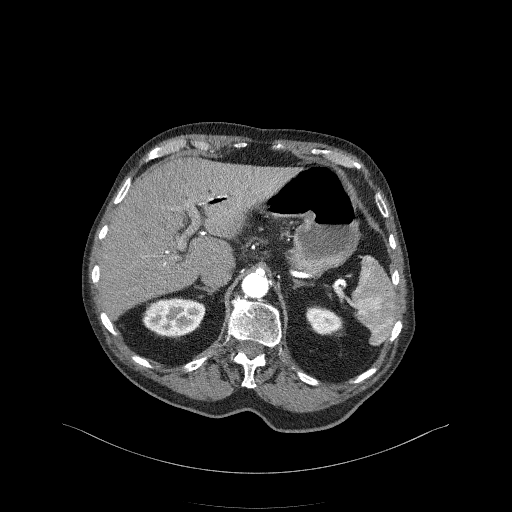
[im 159/250  soft-tissue]
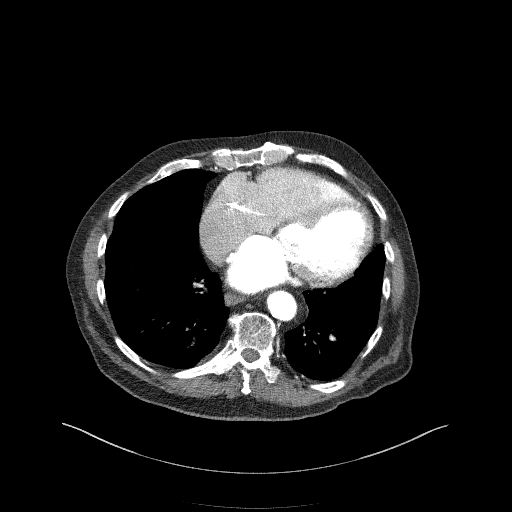
[im 182/250  soft-tissue]
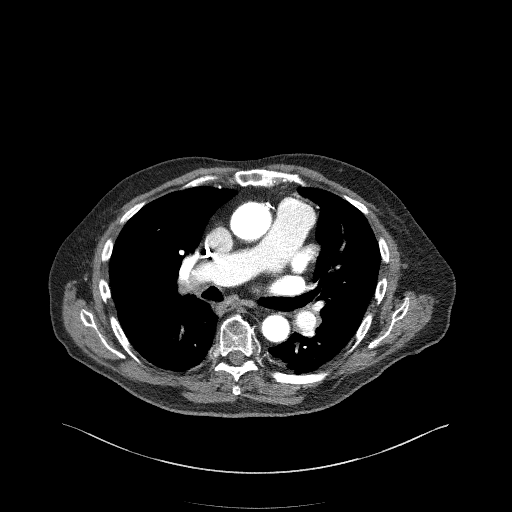
[im 204/250  soft-tissue]
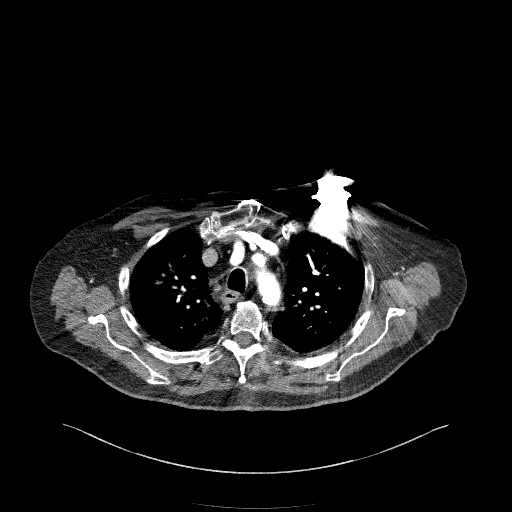
[im 227/250  soft-tissue]
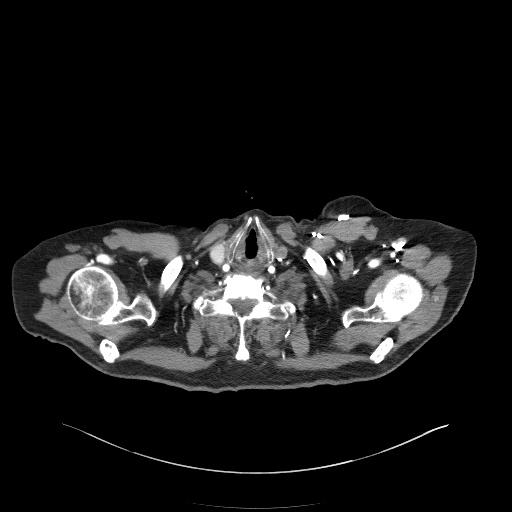
[im 227/250  bone]
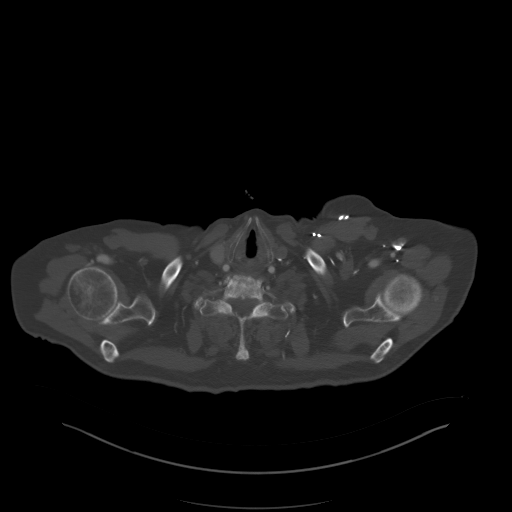

[Series 10: coronals · coronal · 0.98mm/px · 3 of 198 slices shown]
[im 50/198  soft-tissue]
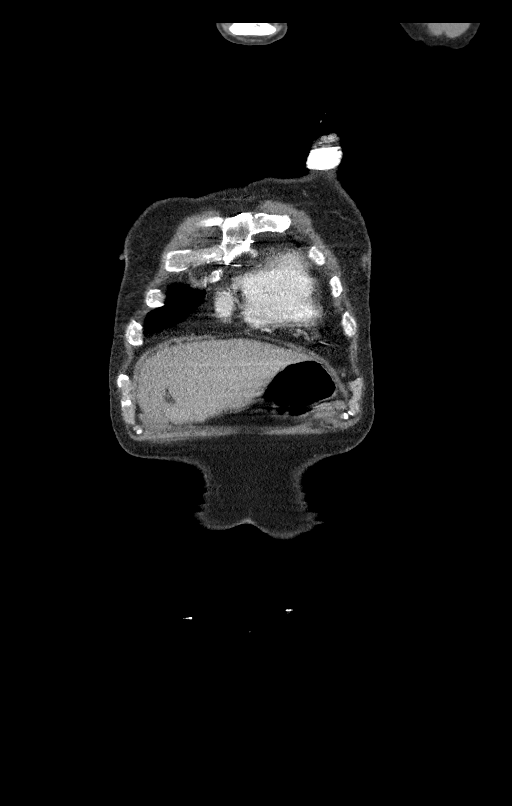
[im 99/198  soft-tissue]
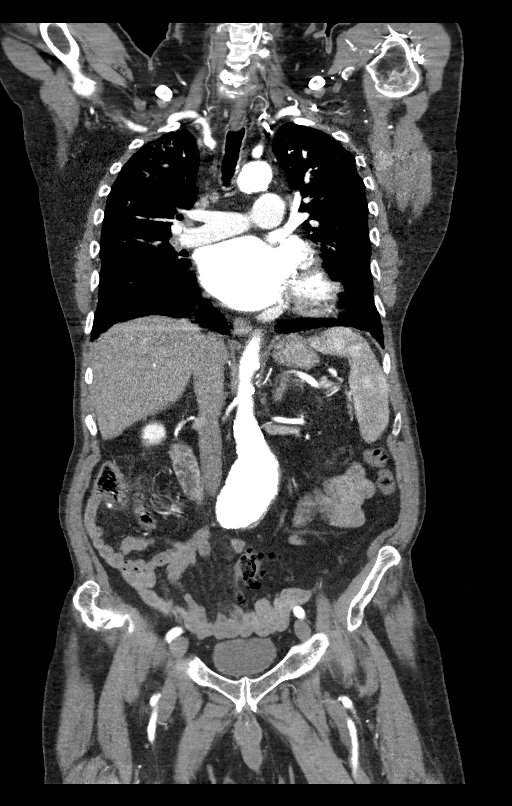
[im 148/198  soft-tissue]
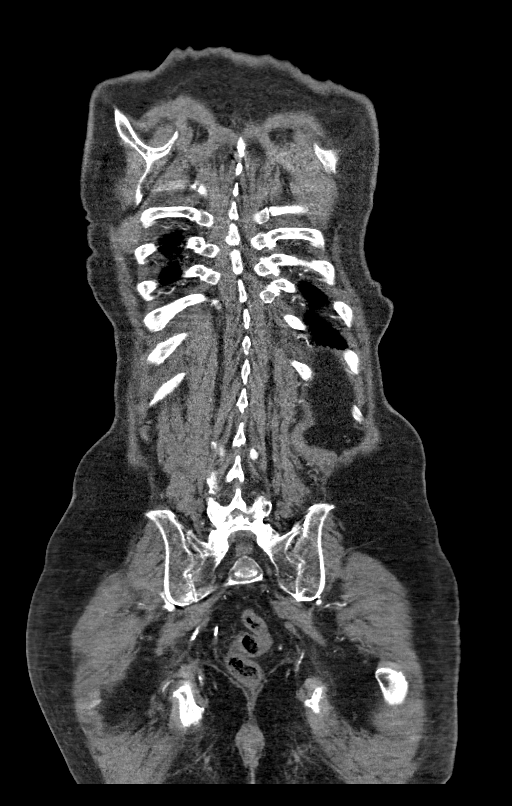

[12 of 46 positions shown; findings below may reference images not displayed]

Multidetector CT imaging through the chest, abdomen and pelvis was
performed using the standard protocol during bolus administration of
intravenous contrast. Multiplanar reconstructed images and MIPs were
obtained and reviewed to evaluate the vascular anatomy.

CONTRAST:  100mL OMNIPAQUE IOHEXOL 350 MG/ML SOLN
FINDINGS: CTA CHEST FINDINGS

Cardiovascular: There is mild cardiomegaly. No pericardial effusion.
There is advanced coronary vascular calcification and postsurgical
changes of CABG. Left pectoral pacemaker device. There is mild
atherosclerotic calcification of the thoracic aorta. No aneurysmal
dilatation or dissection. The origins of the great vessels of the
aortic arch appear patent as visualized. Mild dilatation of the main
pulmonary trunk suggestive of a degree of pulmonary hypertension.
Clinical correlation is recommended.

Mediastinum/Nodes: No hilar or mediastinal adenopathy. The esophagus
and the thyroid gland are grossly unremarkable. No mediastinal fluid
collection.

Lungs/Pleura: There is a patchy area of ground-glass density in the
right upper lobe as well as scattered faint diffuse interstitial
ground-glass densities may represent atypical pneumonia. Clinical
correlation is recommended. No lobar consolidation, pleural
effusion, or pneumothorax. The central airways are patent.

Musculoskeletal: Osteopenia with degenerative changes of the spine.
No acute osseous pathology. Median sternotomy wires.

Review of the MIP images confirms the above findings.

CTA ABDOMEN AND PELVIS FINDINGS

VASCULAR

Aorta: There is advanced atherosclerotic calcification of the
abdominal aorta. There is a fusiform infrarenal abdominal aortic
aneurysm measuring approximately 9 cm in craniocaudal length. The
aneurysm measures approximately 6.2 cm in greatest axial diameter
which has increased since the prior CT (previously measuring
approximately 5.7 cm. There is mixing of contrast and unenhanced
blood within the aneurysm sac. There is a crescent of contrast along
the anterior aneurysm sac (152/7) which may represent intramural
hemorrhage. No definite contrast identified outside of the confines
of the aneurysm wall. No periaortic inflammatory changes. However,
an impending rupture is not excluded. Vascular surgery consult is
advised.

Celiac: The celiac axis and its major branches are patent.

SMA: Patent without evidence of aneurysm, dissection, vasculitis or
significant stenosis.

Renals: Both renal arteries are patent without evidence of aneurysm,
dissection, vasculitis, fibromuscular dysplasia or significant
stenosis.

IMA: There is non opacification of the IMA.

Inflow: There is advanced atherosclerotic calcification of the iliac
arteries. The iliac arteries remain patent.

Veins: No obvious venous abnormality within the limitations of this
arterial phase study.

Review of the MIP images confirms the above findings.

NON-VASCULAR

No intra-abdominal free air or free fluid.

Hepatobiliary: The liver is unremarkable. There is a small
pneumobilia, likely post cholecystectomy.

Pancreas: Unremarkable. No pancreatic ductal dilatation or
surrounding inflammatory changes.

Spleen: Normal in size without focal abnormality.

Adrenals/Urinary Tract: The adrenal glands unremarkable. There is no
hydronephrosis on either side. There is a 3 cm left renal cyst. The
visualized ureters and urinary bladder appear unremarkable.

Stomach/Bowel: There is severe sigmoid diverticulosis and scattered
colonic diverticula without active inflammatory changes. There is no
bowel obstruction or active inflammation. The appendix is not
visualized with certainty. No inflammatory changes identified in the
right lower quadrant.

Lymphatic: No adenopathy.

Reproductive: The prostate and seminal vesicles are grossly
unremarkable. No pelvic mass.

Other: None

Musculoskeletal: Osteopenia with degenerative changes of the spine.
Old fracture deformity of the right pelvic bone. No acute osseous
pathology.

Review of the MIP images confirms the above findings.
IMPRESSION: 1. Increase in the size of the infrarenal abdominal aortic aneurysm
since the prior CT with findings concerning for active mural bleed.
No evidence of rupture at this time, however an impending rupture is
not excluded. Vascular surgery consult is advised.
2. Severe colonic diverticulosis. No bowel obstruction.
3. Bilateral pulmonary ground-glass densities which may represent
atelectasis or atypical infection.
4. Aortic Atherosclerosis ([id number]).

These results were called by telephone at the time of interpretation
on 06/21/2020 at [DATE] to provider nurse practitioner, Nigo Lii
who verbally acknowledged these results.

## 2021-09-02 DIAGNOSIS — E785 Hyperlipidemia, unspecified: Secondary | ICD-10-CM | POA: Diagnosis not present

## 2021-09-02 DIAGNOSIS — E1169 Type 2 diabetes mellitus with other specified complication: Secondary | ICD-10-CM | POA: Diagnosis not present

## 2021-09-02 DIAGNOSIS — E1122 Type 2 diabetes mellitus with diabetic chronic kidney disease: Secondary | ICD-10-CM | POA: Diagnosis not present

## 2021-09-02 DIAGNOSIS — E113399 Type 2 diabetes mellitus with moderate nonproliferative diabetic retinopathy without macular edema, unspecified eye: Secondary | ICD-10-CM | POA: Diagnosis not present

## 2021-09-02 DIAGNOSIS — N182 Chronic kidney disease, stage 2 (mild): Secondary | ICD-10-CM | POA: Diagnosis not present

## 2021-09-02 DIAGNOSIS — Z7984 Long term (current) use of oral hypoglycemic drugs: Secondary | ICD-10-CM | POA: Diagnosis not present

## 2021-09-02 DIAGNOSIS — E1159 Type 2 diabetes mellitus with other circulatory complications: Secondary | ICD-10-CM | POA: Diagnosis not present

## 2021-09-02 DIAGNOSIS — Z8679 Personal history of other diseases of the circulatory system: Secondary | ICD-10-CM | POA: Diagnosis not present

## 2021-09-02 DIAGNOSIS — R7989 Other specified abnormal findings of blood chemistry: Secondary | ICD-10-CM | POA: Diagnosis not present

## 2021-09-02 DIAGNOSIS — Z8719 Personal history of other diseases of the digestive system: Secondary | ICD-10-CM | POA: Diagnosis not present

## 2021-09-02 DIAGNOSIS — E559 Vitamin D deficiency, unspecified: Secondary | ICD-10-CM | POA: Diagnosis not present

## 2021-09-19 ENCOUNTER — Ambulatory Visit (INDEPENDENT_AMBULATORY_CARE_PROVIDER_SITE_OTHER): Payer: Medicare Other

## 2021-09-19 DIAGNOSIS — I4729 Other ventricular tachycardia: Secondary | ICD-10-CM

## 2021-09-20 LAB — CUP PACEART REMOTE DEVICE CHECK
Battery Remaining Longevity: 4 mo
Battery Voltage: 2.79 V
Brady Statistic AP VP Percent: 15.46 %
Brady Statistic AP VS Percent: 0.82 %
Brady Statistic AS VP Percent: 69.65 %
Brady Statistic AS VS Percent: 14.08 %
Brady Statistic RA Percent Paced: 14.38 %
Brady Statistic RV Percent Paced: 85.92 %
Date Time Interrogation Session: 20230821012303
HighPow Impedance: 42 Ohm
HighPow Impedance: 58 Ohm
Implantable Lead Implant Date: 20040309
Implantable Lead Implant Date: 20040309
Implantable Lead Location: 753859
Implantable Lead Location: 753860
Implantable Lead Model: 158
Implantable Lead Model: 5076
Implantable Lead Serial Number: 115061
Implantable Pulse Generator Implant Date: 20161007
Lead Channel Impedance Value: 399 Ohm
Lead Channel Impedance Value: 399 Ohm
Lead Channel Impedance Value: 4047 Ohm
Lead Channel Impedance Value: 4047 Ohm
Lead Channel Impedance Value: 4047 Ohm
Lead Channel Impedance Value: 4047 Ohm
Lead Channel Impedance Value: 4047 Ohm
Lead Channel Impedance Value: 4047 Ohm
Lead Channel Impedance Value: 4047 Ohm
Lead Channel Impedance Value: 4047 Ohm
Lead Channel Impedance Value: 4047 Ohm
Lead Channel Impedance Value: 4047 Ohm
Lead Channel Impedance Value: 418 Ohm
Lead Channel Pacing Threshold Amplitude: 0.625 V
Lead Channel Pacing Threshold Amplitude: 0.75 V
Lead Channel Pacing Threshold Pulse Width: 0.4 ms
Lead Channel Pacing Threshold Pulse Width: 0.4 ms
Lead Channel Sensing Intrinsic Amplitude: 1.125 mV
Lead Channel Sensing Intrinsic Amplitude: 1.125 mV
Lead Channel Sensing Intrinsic Amplitude: 6.25 mV
Lead Channel Sensing Intrinsic Amplitude: 6.25 mV
Lead Channel Setting Pacing Amplitude: 2 V
Lead Channel Setting Pacing Amplitude: 2.5 V
Lead Channel Setting Pacing Pulse Width: 0.4 ms
Lead Channel Setting Sensing Sensitivity: 0.3 mV

## 2021-09-23 DIAGNOSIS — E1169 Type 2 diabetes mellitus with other specified complication: Secondary | ICD-10-CM | POA: Diagnosis not present

## 2021-09-23 DIAGNOSIS — I50810 Right heart failure, unspecified: Secondary | ICD-10-CM | POA: Diagnosis not present

## 2021-09-23 DIAGNOSIS — Z1331 Encounter for screening for depression: Secondary | ICD-10-CM | POA: Diagnosis not present

## 2021-09-23 DIAGNOSIS — D6869 Other thrombophilia: Secondary | ICD-10-CM | POA: Diagnosis not present

## 2021-09-23 DIAGNOSIS — I472 Ventricular tachycardia, unspecified: Secondary | ICD-10-CM | POA: Diagnosis not present

## 2021-09-23 DIAGNOSIS — Z0000 Encounter for general adult medical examination without abnormal findings: Secondary | ICD-10-CM | POA: Diagnosis not present

## 2021-09-23 DIAGNOSIS — E559 Vitamin D deficiency, unspecified: Secondary | ICD-10-CM | POA: Diagnosis not present

## 2021-09-23 DIAGNOSIS — Z1389 Encounter for screening for other disorder: Secondary | ICD-10-CM | POA: Diagnosis not present

## 2021-09-23 DIAGNOSIS — I4581 Long QT syndrome: Secondary | ICD-10-CM | POA: Diagnosis not present

## 2021-09-23 DIAGNOSIS — I480 Paroxysmal atrial fibrillation: Secondary | ICD-10-CM | POA: Diagnosis not present

## 2021-09-23 DIAGNOSIS — D692 Other nonthrombocytopenic purpura: Secondary | ICD-10-CM | POA: Diagnosis not present

## 2021-09-23 DIAGNOSIS — J441 Chronic obstructive pulmonary disease with (acute) exacerbation: Secondary | ICD-10-CM | POA: Diagnosis not present

## 2021-09-28 DIAGNOSIS — R040 Epistaxis: Secondary | ICD-10-CM | POA: Diagnosis not present

## 2021-10-02 IMAGING — DX DG CHEST 1V PORT
2 series · 2 of 2 positions shown · non-contrast
Comparison: Chest x-ray 07/14/2020.

CLINICAL DATA: 88-year-old male status post bronchoscopy earlier
today.

EXAM:
PORTABLE CHEST 1 VIEW

[chest ap (1 of 2)]
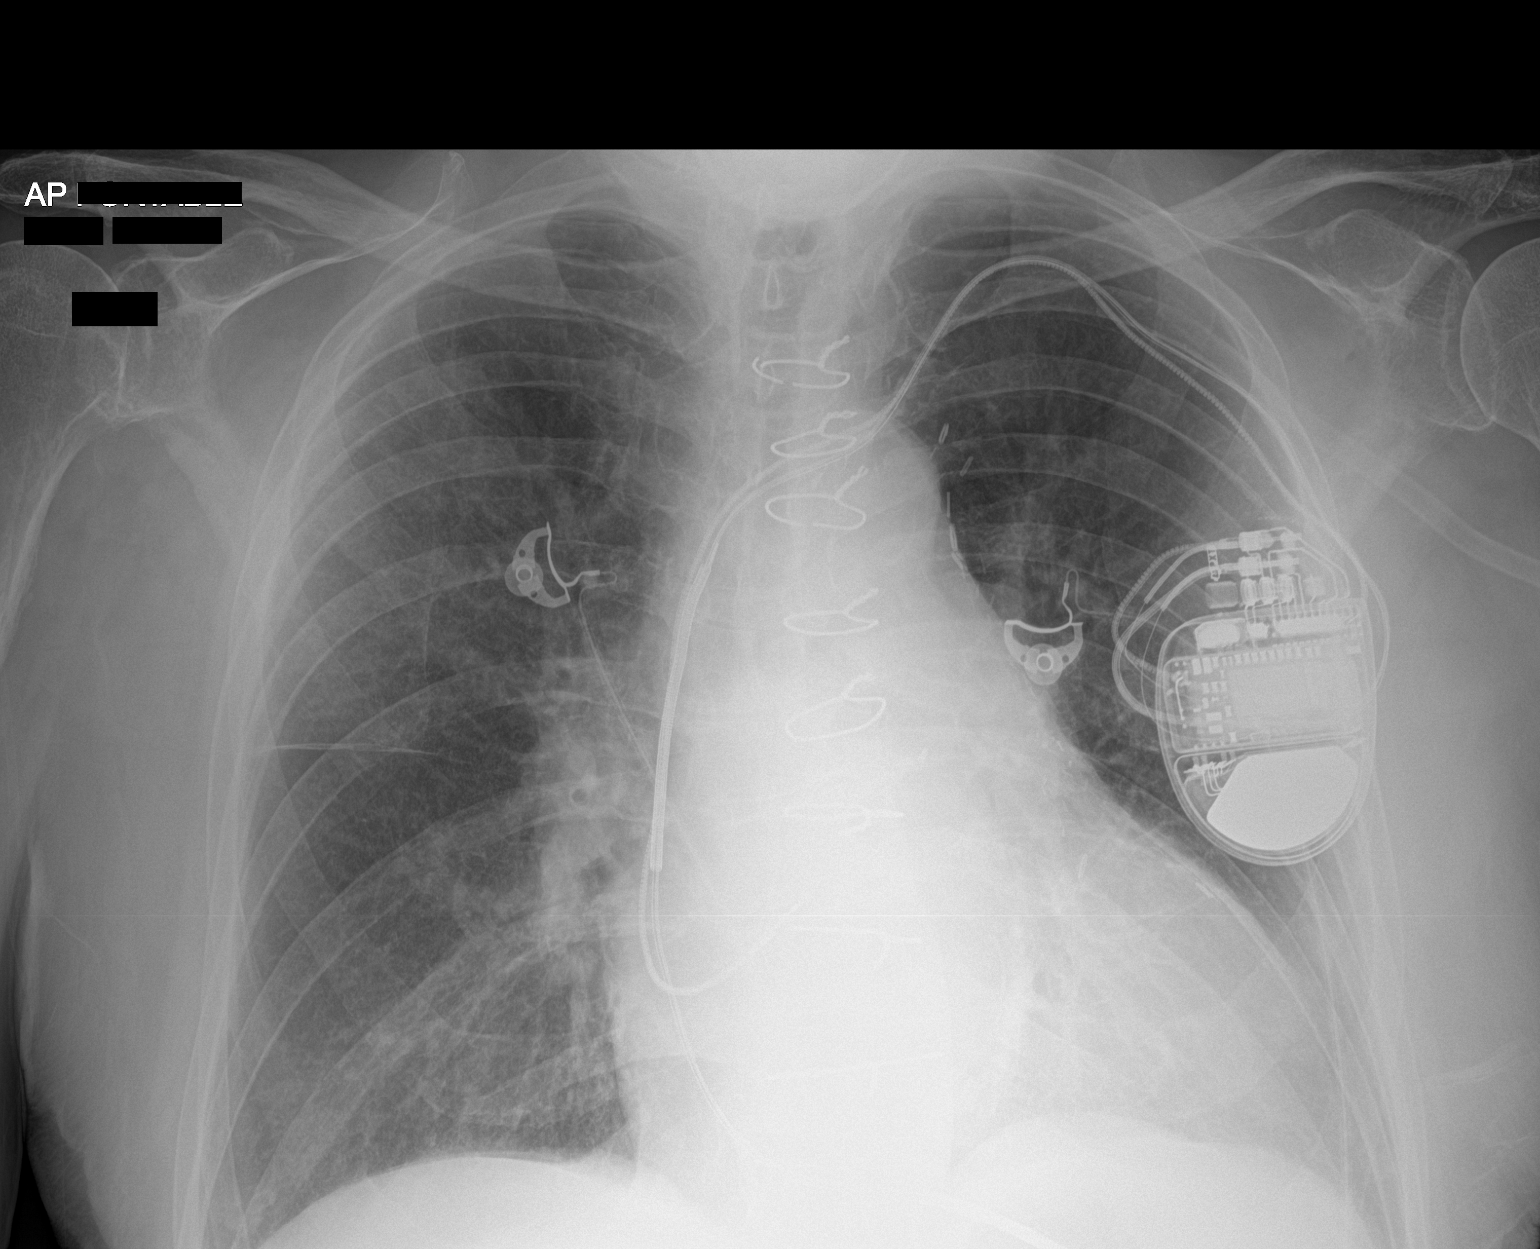

[chest ap (2 of 2)]
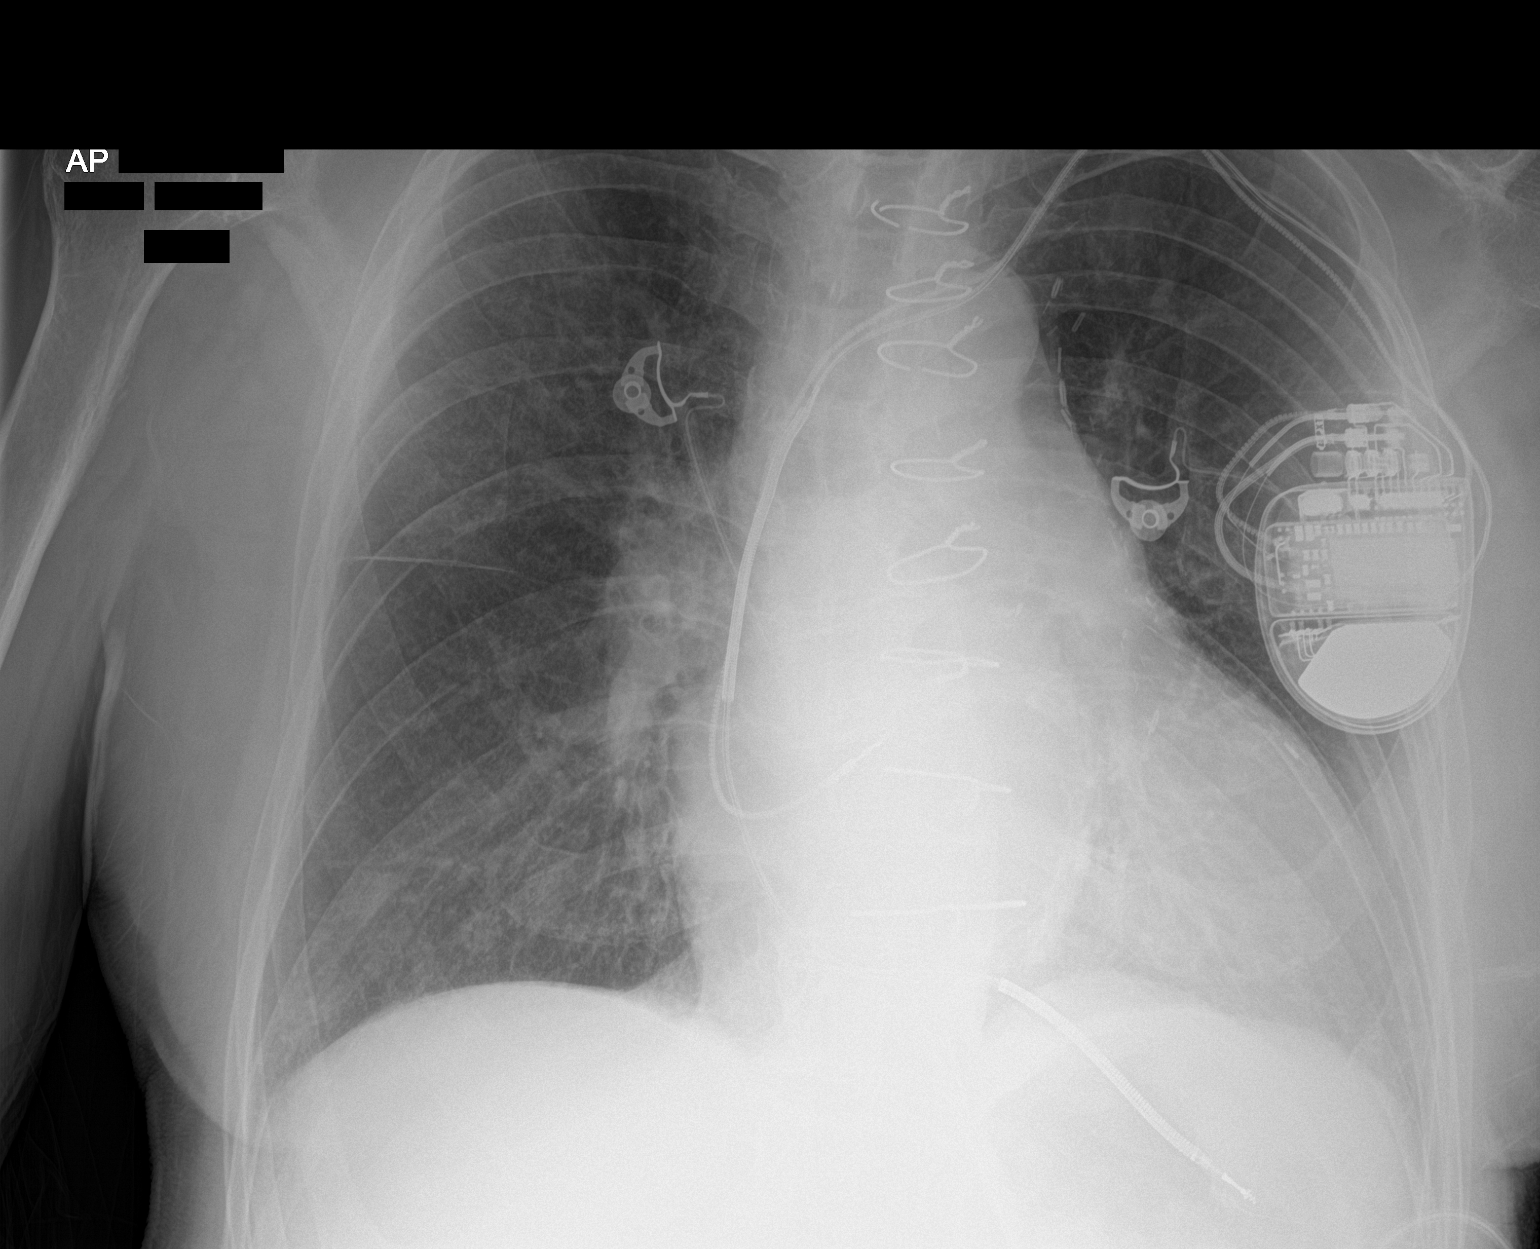

[2 of 2 positions shown; findings below may reference images not displayed]

FINDINGS: Lung volumes are normal. No pneumothorax. No consolidative airspace
disease. No pleural effusions. No evidence of pulmonary edema. Heart
size is mildly enlarged. Upper mediastinal contours are within
normal limits. Severe calcifications of the mitral annulus.
Left-sided pacemaker/AICD with lead tips projecting over the
expected location of the right atrium and right ventricular apex.
Status post median sternotomy for CABG.
IMPRESSION: 1. No radiographic evidence of acute cardiopulmonary disease.
Specifically, no postprocedural pneumothorax or other signs of acute
complication.
2. Aortic atherosclerosis.

## 2021-10-04 ENCOUNTER — Ambulatory Visit: Payer: Medicare Other | Attending: Internal Medicine | Admitting: Internal Medicine

## 2021-10-04 ENCOUNTER — Encounter: Payer: Self-pay | Admitting: Internal Medicine

## 2021-10-04 VITALS — BP 110/76 | HR 92 | Ht 73.0 in | Wt 196.0 lb

## 2021-10-04 DIAGNOSIS — I4819 Other persistent atrial fibrillation: Secondary | ICD-10-CM | POA: Diagnosis not present

## 2021-10-04 DIAGNOSIS — I4729 Other ventricular tachycardia: Secondary | ICD-10-CM | POA: Diagnosis not present

## 2021-10-04 DIAGNOSIS — I493 Ventricular premature depolarization: Secondary | ICD-10-CM | POA: Diagnosis not present

## 2021-10-04 DIAGNOSIS — Z95810 Presence of automatic (implantable) cardiac defibrillator: Secondary | ICD-10-CM | POA: Insufficient documentation

## 2021-10-04 NOTE — Patient Instructions (Signed)
Medication Instructions:  Your physician recommends that you continue on your current medications as directed. Please refer to the Current Medication list given to you today.  *If you need a refill on your cardiac medications before your next appointment, please call your pharmacy*   Lab Work: None ordered.  If you have labs (blood work) drawn today and your tests are completely normal, you will receive your results only by: MyChart Message (if you have MyChart) OR A paper copy in the mail If you have any lab test that is abnormal or we need to change your treatment, we will call you to review the results.   Testing/Procedures: None ordered.    Follow-Up: At [facility], you and your health needs are our priority.  As part of our continuing mission to provide you with exceptional heart care, we have created designated Provider Care Teams.  These Care Teams include your primary Cardiologist (physician) and Advanced Practice Providers (APPs -  Physician Assistants and Nurse Practitioners) who all work together to provide you with the care you need, when you need it.  We recommend signing up for the patient portal called "MyChart".  Sign up information is provided on this After Visit Summary.  MyChart is used to connect with patients for Virtual Visits (Telemedicine).  Patients are able to view lab/test results, encounter notes, upcoming appointments, etc.  Non-urgent messages can be sent to your provider as well.   To learn more about what you can do with MyChart, go to [email or web address].    Your next appointment:   6 months with Dr Klein  Important Information About Sugar       

## 2021-10-04 NOTE — Progress Notes (Signed)
Patient Care Team: Sueanne Margarita, DO as PCP - General (Internal Medicine) Sherren Mocha, MD as PCP - Cardiology (Cardiology) Deboraha Sprang, MD as PCP - Electrophysiology (Cardiology)   HPI  [name] is a 88 y.o. male Seen in followup for polymorphic ventricular tachycardia for which he has an ICD implantation 2004 with generator change 2010 and  2016 as well as persistent atrial fibrillation which was quite symptomatic and prompted a visit to the A. fib clinic with the plan for anticoagulation and TEE cardioversion.  Currently maintained on Eliquis and dofetilide  Last device implanted with CRT but no LV lead was placed.  History of coronary disease with prior CABG mitral valve repair both of which were done at [facility]  PFO closure at that time.     AFib assoc with slowish Ventricular response but with dyspnea on exertion and fatigue.  He is quite aware of when he is in A. fib and when he is not.  Interval issues include 1-none resolving right upper lobe pneumonia for which he underwent bronchoscopy 6/22 cytology negative 2-expanding abdominal aortic aneurysm--repair in the past of been recommended.  Phone call note 06/21/2020 referred to repeat vascular surgery clinic evaluation for which I do not see a note   Lengthy discussion regarding his choosing not to pursue surgical relief of his AAA,  he is at peace with the potential of his dying suddenly. The patient denies chest pain, shortness of breath, nocturnal dyspnea, orthopnea or peripheral edema.  There have been no palpitations, lightheadedness or syncope.   .  Antiarrhythmics Date Reason stopped  dofetilide 9/20  ongoing   Amiodarone / Tried years ago following CABG stopped but pt does not recall adverse effects      DATE TEST EF   6/14 LHC 35-40% LIMA/ RIMA patent   2/17 Echo   30 %   12/18 LHC   LIMA-LAD and right radial graft-OM- patent proximal cx stenosis with negative FFR  11/19 Echo  35-40% LAE  Severe (69/3.0/63) Pulm HTN 50  10/20 Echo  35-40% BAE  Pulm NTH 41 mm  4/22 Echo  35-40% BAE  PA sys 45  5/22 CTA-abd  AAA 6.2 <<5.7 cm        Date Cr K Mg Hgb  9/16  0.99 4.2 2.1   12/17  1.08 4.6 2.1   5/19 1.00 4.5 2.2   10/19 1.04 4.4  13.2 (11/19)  1/21  0.9 4.3 1.9 13.4  2/22 1.04 4.5 1.9 12.2<<13.9  6/22 0.93 4.8  14.1  8/23 0.98 5.0         Device History: STJ dual chamber ICD implanted 2004 for PMVT, ICM; generator change 2010; gen change 2016 (MDT device) History of appropriate therapy: Yes History of AAD therapy: Yes Thromboembolic risk factors ( age  -2, HTN-1, TIA/CVA-2, Vasc disease -1, CHF-1) for a CHADSVASc Score of >=7   Past Medical History:  Diagnosis Date   Acute on chronic combined systolic (congestive) and diastolic (congestive) heart failure (HCC)    AICD (automatic cardioverter/defibrillator) present    Medtronicn PPM/ICD   Carotid stenosis, right    s/p endarterectomy 11/07/18   Chronic combined systolic (congestive) and diastolic (congestive) heart failure ([place])    Coronary artery disease    [facility]   Diabetes mellitus    Dysrhythmia    Endocarditis, valve unspecified    Mitral   Headache    hx migraines 6-7 x mo   History of  migraine headaches    History of seasonal allergies    ICD (implantable cardiac defibrillator) in place    Optic nerve hemorrhage, 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  We will monitor this closely and look for changes over the ensuing 6 months.  Repeat visit here in 6 months   PAF (paroxysmal atrial fibrillation) (HCC)    NO LAA occlusion at the time of surgery  [name] 2018   Pleural effusion    PVC's (premature ventricular contractions)    Ventricular tachycardia, polymorphic ([place])    status post ICD implantation    Past Surgical History:  Procedure Laterality Date   BRONCHIAL BIOPSY  07/22/2020    Procedure: BRONCHIAL BIOPSIES;  Surgeon: Collene Gobble, MD;  Location: [facility];  Service: Cardiopulmonary;;   BRONCHIAL BRUSHINGS  07/22/2020   Procedure: BRONCHIAL BRUSHINGS;  Surgeon: Collene Gobble, MD;  Location: [facility];  Service: Cardiopulmonary;;   BRONCHIAL WASHINGS  07/22/2020   Procedure: BRONCHIAL WASHINGS;  Surgeon: Collene Gobble, MD;  Location: [facility];  Service: Cardiopulmonary;;   CARDIAC CATHETERIZATION  04/03/2007   EF 40%   CARDIAC CATHETERIZATION  02/06/2006   EF 45%. ANTERIOR HYPOKINESIS   CARDIAC CATHETERIZATION  05/29/2000   EF 50%. MILD ANTERIOR HYPOKINESIS   CARDIAC CATHETERIZATION  10/25/1999   EF 50%. SEVERE MITRAL REGURGITATION   CARDIAC CATHETERIZATION  01/06/2003   EF 50%   CARDIAC DEFIBRILLATOR PLACEMENT     CARDIOVERSION N/A 02/07/2019   Procedure: CARDIOVERSION;  Surgeon: Fay Records, MD;  Location: [facility] ENDOSCOPY;  Service: Cardiovascular;  Laterality: N/A;   CARDIOVERSION N/A 11/24/2019   Procedure: CARDIOVERSION;  Surgeon: Josue Hector, MD;  Location: [facility] ENDOSCOPY;  Service: Cardiovascular;  Laterality: N/A;   CATARACT EXTRACTION W/ INTRAOCULAR LENS  IMPLANT, BILATERAL     CHOLECYSTECTOMY N/A 01/08/2019   Procedure: LAPAROSCOPIC CHOLECYSTECTOMY WITH INTRAOPERATIVE CHOLANGIOGRAM;  Surgeon: Donnie Mesa, MD;  Location: [place];  Service: General;  Laterality: N/A;   CORONARY ARTERY BYPASS GRAFT  2001   2 VESSEL CABG AND MITRAL VALVE REPAIR. HE HAD A LIMA GRAFT TO THE LAD AND RADIAL ARTERY  GRAFT TO THE OBTUSE MARGINAL  OF LEFT CIRCUMFLEX   ENDARTERECTOMY Right 11/07/2018   Procedure: ENDARTERECTOMY CAROTID RIGHT;  Surgeon: Waynetta Sandy, MD;  Location: [place];  Service: Vascular;  Laterality: Right;   EP IMPLANTABLE DEVICE N/A 11/06/2014   Procedure: ICD Generator Changeout;  Surgeon: Deboraha Sprang, MD;  Location: [place] [facility];  Service: Cardiovascular;  Laterality: N/A;   ERCP N/A 01/10/2019   Procedure: ENDOSCOPIC  RETROGRADE CHOLANGIOPANCREATOGRAPHY (ERCP);  Surgeon: Clarene Essex, MD;  Location: [place];  Service: Endoscopy;  Laterality: N/A;   FOREIGN BODY REMOVAL Right 06/21/2017   Procedure: FOREIGN BODY REMOVAL ADULT RIGHT RING FINGER;  Surgeon: Leanora Cover, MD;  Location: [place];  Service: Orthopedics;  Laterality: Right;   HEMORRHOID SURGERY     HEMORROIDECTOMY     INTRAVASCULAR PRESSURE WIRE/FFR STUDY N/A 01/05/2017   Procedure: INTRAVASCULAR PRESSURE WIRE/FFR STUDY;  Surgeon: Sherren Mocha, MD;  Location: [place] [facility];  Service: Cardiovascular;  Laterality: N/A;   KNEE ARTHROSCOPY     RIGHT KNEE   LAPAROSCOPIC APPENDECTOMY N/A 01/08/2019   Procedure: APPENDECTOMY LAPAROSCOPIC;  Surgeon: Donnie Mesa, MD;  Location: [place];  Service: General;  Laterality: N/A;   LEFT HEART CATHETERIZATION WITH CORONARY ANGIOGRAM N/A 07/04/2012  Procedure: LEFT HEART CATHETERIZATION WITH CORONARY ANGIOGRAM;  Surgeon: Sherren Mocha, MD;  Location: [facility] CATH LAB;  Service: Cardiovascular;  Laterality: N/A;   MITRAL VALVE REPLACEMENT     No LAA occlusion at the time of surgery  Karie Soda report 2018   REMOVAL OF STONES  01/10/2019   Procedure: REMOVAL OF STONES;  Surgeon: Clarene Essex, MD;  Location: [place];  Service: Endoscopy;;   RIGHT/LEFT HEART CATH AND CORONARY/GRAFT ANGIOGRAPHY N/A 01/05/2017   Procedure: RIGHT/LEFT HEART CATH AND CORONARY/GRAFT ANGIOGRAPHY;  Surgeon: Sherren Mocha, MD;  Location: [place] [facility];  Service: Cardiovascular;  Laterality: N/A;   SPHINCTEROTOMY  01/10/2019   Procedure: SPHINCTEROTOMY;  Surgeon: Clarene Essex, MD;  Location: [place];  Service: Endoscopy;;   TRANSTHORACIC ECHOCARDIOGRAM  04/02/2007   EF 35%   VIDEO BRONCHOSCOPY Right 07/22/2020   Procedure: VIDEO BRONCHOSCOPY WITH FLUORO, BAL AND BIOPSY;  Surgeon: Collene Gobble, MD;  Location: [facility];  Service: Cardiopulmonary;  Laterality: Right;  CONSCIOUS SEDATION OR MAC    Current Outpatient Medications   Medication Sig Dispense Refill   albuterol (VENTOLIN HFA) 108 (90 Base) MCG/ACT inhaler Inhale 2 puffs into the lungs every 6 (six) hours as needed for wheezing or shortness of breath. 8 g 5   atorvastatin (LIPITOR) 80 MG tablet TAKE ONE TABLET BY MOUTH EVERY DAY FOR CHOLESTEROL 90 tablet 3   cetirizine (ZYRTEC) 10 MG tablet Take 10 mg by mouth in the morning.     Cholecalciferol (VITAMIN D) 125 MCG (5000 UT) CAPS Take 5,000-10,000 Units by mouth See admin instructions. Take 1 capsule (5000 units) by mouth on Tuesdays, Thursdays, Saturdays, & Sundays at bedtime. Take 2 capsules (10000 units) by mouth on Mondays, Wednesdays, & Fridays at bedtime.     ELIQUIS 5 MG TABS tablet TAKE ONE TABLET BY MOUTH TWICE DAILY 180 tablet 1   empagliflozin (JARDIANCE) 10 MG TABS tablet Take 10 mg by mouth in the morning.     EPIPEN 2-PAK 0.3 MG/0.3ML SOAJ injection Inject 0.3 mg into the muscle daily as needed for anaphylaxis.      Erenumab-aooe (AIMOVIG) 140 MG/ML SOAJ Inject 140 mg into the skin every 28 (twenty-eight) days. 1.12 mL 11   fluticasone (FLONASE) 50 MCG/ACT nasal spray Place 1 spray into the nose daily as needed for rhinitis or allergies.     ibuprofen (ADVIL) 200 MG tablet Take 400 mg by mouth every 8 (eight) hours as needed (headaches/migraine).     Magnesium 250 MG TABS Take 250 mg by mouth in the morning.     metoprolol succinate (TOPROL-XL) 25 MG 24 hr tablet TAKE ONE TABLET BY MOUTH TWICE DAILY WITH OR IMMEDIATELY FOLLOWING A MEAL 180 tablet 2   Multiple Vitamin (MULTIVITAMIN WITH MINERALS) TABS tablet Take 1 tablet by mouth at bedtime. Centrum Silver     Multiple Vitamins-Minerals (PRESERVISION AREDS 2) CAPS as directed Orally     nateglinide (STARLIX) 60 MG tablet Take 1 tablet twice a day before meals for diabetes     ONETOUCH VERIO test strip SMARTSIG:Via Meter 1-2 Times Daily     sitaGLIPtin (JANUVIA) 100 MG tablet Take 100 mg by mouth at bedtime.     Tiotropium Bromide-Olodaterol  (STIOLTO RESPIMAT) 2.5-2.5 MCG/ACT AERS Inhale 2 puffs into the lungs at bedtime.     Ibuprofen 200 MG CAPS #2 as needed for migraine Orally (Patient not taking: Reported on 10/04/2021)     No current facility-administered medications for this visit.    Allergies  Allergen Reactions   Bee Venom Anaphylaxis   Latex Other (See Comments)    REDNESS AT REACTION SITE.   Metformin Diarrhea   Rivaroxaban Other (See Comments)    Headaches, blurred vision   Sotalol Other (See Comments)    "BLUE TOES"- cut his circulation off   Amiodarone     Other reaction(s): rash, doesn't sound like DRESS syndrome.   Levofloxacin     Other reaction(s): long QT syndrome, so avoid   Warfarin Sodium Other (See Comments)    REACTION: migraine headaches/vision impariment    Review of Systems negative except from HPI and PMH  Physical Exam  BP 110/76   Pulse 92   Ht _0  (1.854 m)   Wt 196 lb (88.9 kg)   BMI 25.86 kg/m   Well developed and well nourished in no acute distress HENT normal Neck supple with JVP-flat Clear Device pocket well healed; without hematoma or erythema.  There is no tethering  Regular rate and rhythm, no  gallop No  murmur Abd-soft with active BS No Clubbing cyanosis  edema Skin-warm and dry A & Oriented  Grossly normal sensory and motor function  ECG    Assessment and  Plan   Ischemic heart disease with prior bypass ejection 35-40%  Congestive heart failure-chronic-systolic  Implantable defibrillator-Medtronic dual chamber with LV port plugged.   PVCs//Ventricular tachycardia nonsustained     Atrial fibrillation persistent/recurrent  COPD-reactive airways disease  Increased burden of Afib, but without symptoms; continue Apixaban   Euvolemic Device apporaching ERI

## 2021-10-15 DIAGNOSIS — Z23 Encounter for immunization: Secondary | ICD-10-CM | POA: Diagnosis not present

## 2021-10-17 NOTE — Progress Notes (Signed)
Remote ICD transmission.   

## 2021-10-17 NOTE — Addendum Note (Signed)
Addended by: Douglass Rivers D on: 10/17/2021 02:03 PM   Modules accepted: Level of Service

## 2021-10-21 IMAGING — DX DG CHEST 2V
2 series · 2 of 2 positions shown · non-contrast
Comparison: Chest x-ray 07/22/2020.

CLINICAL DATA: 88-year-old male with history of pneumonia.

EXAM:
CHEST - 2 VIEW

[chest pa]
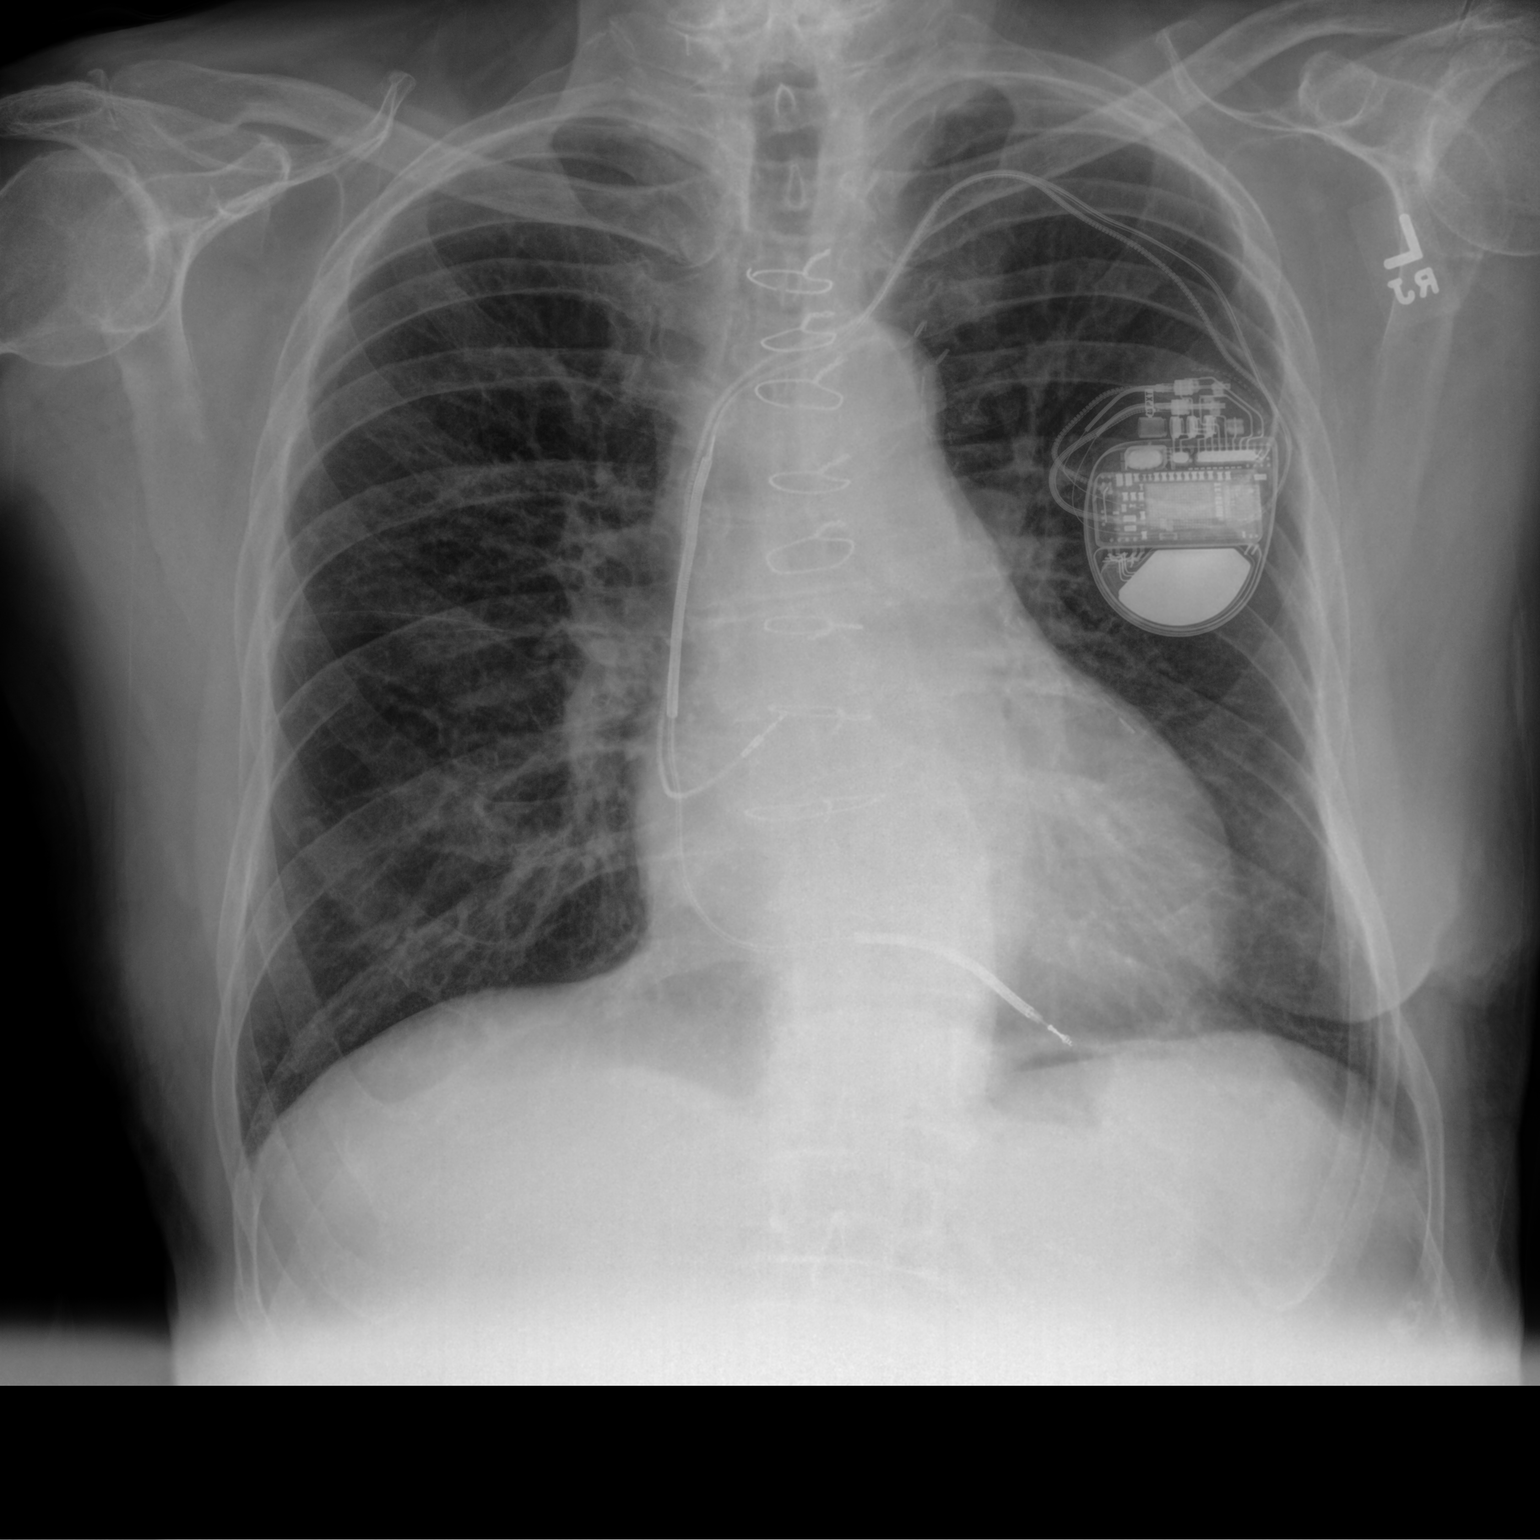

[chest lat]
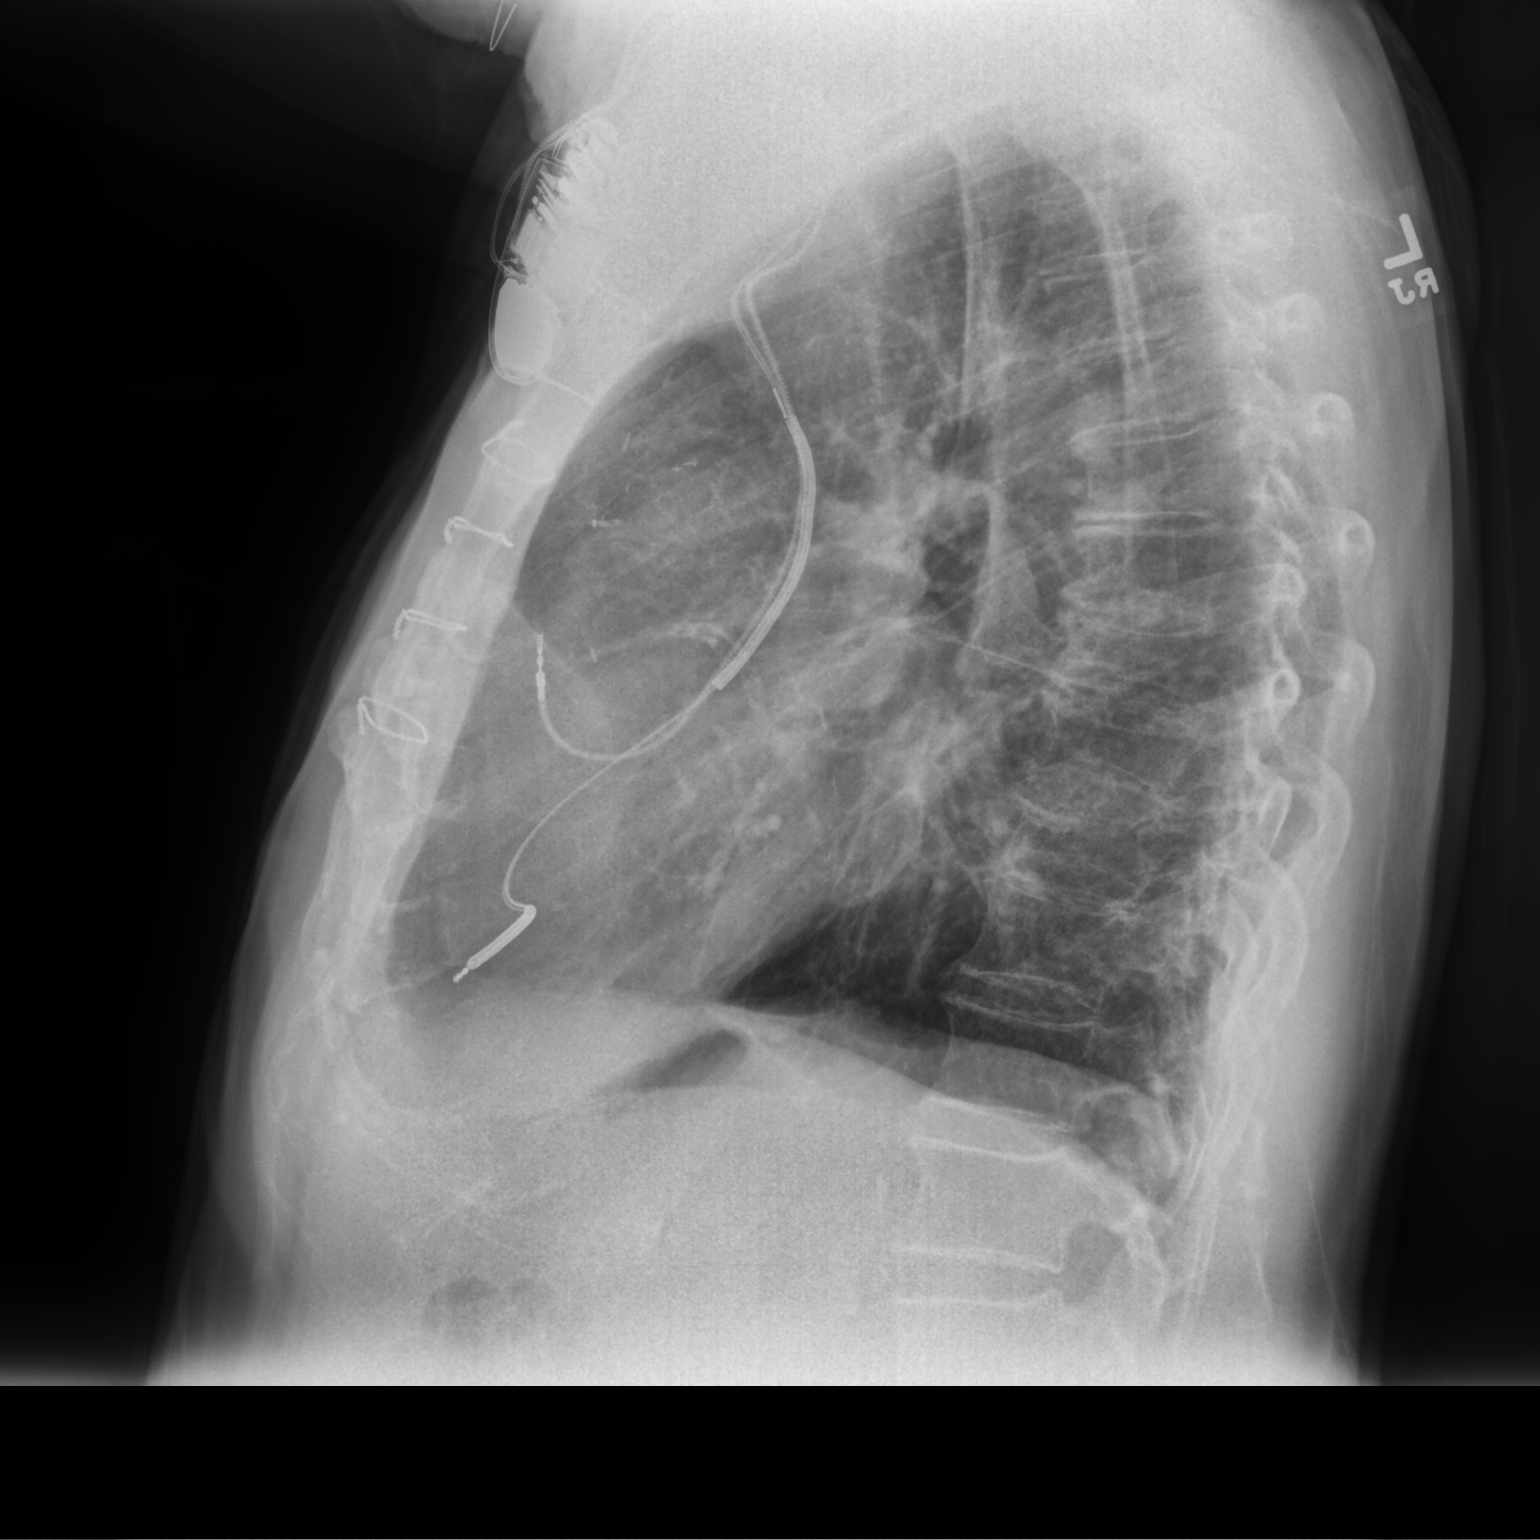

[2 of 2 positions shown; findings below may reference images not displayed]

FINDINGS: Lung volumes are normal. No consolidative airspace disease. No
pleural effusions. No pneumothorax. No pulmonary nodule or mass
noted. Pulmonary vasculature and the cardiomediastinal silhouette
are within normal limits. Atherosclerosis in the thoracic aorta.
Left-sided pacemaker/AICD with lead tips projecting over the
expected location of the right atrium and right ventricle. Status
post median sternotomy.
IMPRESSION: 1.  No radiographic evidence of acute cardiopulmonary disease.
2. Aortic atherosclerosis.

## 2021-10-24 ENCOUNTER — Ambulatory Visit (INDEPENDENT_AMBULATORY_CARE_PROVIDER_SITE_OTHER): Payer: Medicare Other

## 2021-10-24 DIAGNOSIS — I4729 Other ventricular tachycardia: Secondary | ICD-10-CM

## 2021-10-25 LAB — CUP PACEART REMOTE DEVICE CHECK
Battery Remaining Longevity: 3 mo
Battery Voltage: 2.76 V
Brady Statistic RA Percent Paced: 1.07 %
Brady Statistic RV Percent Paced: 84.25 %
Date Time Interrogation Session: 20230925062526
HighPow Impedance: 43 Ohm
HighPow Impedance: 55 Ohm
Implantable Lead Implant Date: 20040309
Implantable Lead Implant Date: 20040309
Implantable Lead Location: 753859
Implantable Lead Location: 753860
Implantable Lead Model: 158
Implantable Lead Model: 5076
Implantable Lead Serial Number: 115061
Implantable Pulse Generator Implant Date: 20161007
Lead Channel Impedance Value: 399 Ohm
Lead Channel Impedance Value: 399 Ohm
Lead Channel Impedance Value: 4047 Ohm
Lead Channel Impedance Value: 4047 Ohm
Lead Channel Impedance Value: 4047 Ohm
Lead Channel Impedance Value: 4047 Ohm
Lead Channel Impedance Value: 4047 Ohm
Lead Channel Impedance Value: 4047 Ohm
Lead Channel Impedance Value: 4047 Ohm
Lead Channel Impedance Value: 4047 Ohm
Lead Channel Impedance Value: 4047 Ohm
Lead Channel Impedance Value: 4047 Ohm
Lead Channel Impedance Value: 418 Ohm
Lead Channel Pacing Threshold Amplitude: 0.625 V
Lead Channel Pacing Threshold Amplitude: 0.625 V
Lead Channel Pacing Threshold Pulse Width: 0.4 ms
Lead Channel Pacing Threshold Pulse Width: 0.4 ms
Lead Channel Sensing Intrinsic Amplitude: 1 mV
Lead Channel Sensing Intrinsic Amplitude: 1 mV
Lead Channel Sensing Intrinsic Amplitude: 6.5 mV
Lead Channel Sensing Intrinsic Amplitude: 6.5 mV
Lead Channel Setting Pacing Amplitude: 2 V
Lead Channel Setting Pacing Amplitude: 2.5 V
Lead Channel Setting Pacing Pulse Width: 0.4 ms
Lead Channel Setting Sensing Sensitivity: 0.3 mV

## 2021-10-27 DIAGNOSIS — R040 Epistaxis: Secondary | ICD-10-CM | POA: Diagnosis not present

## 2021-10-30 ENCOUNTER — Encounter: Payer: Self-pay | Admitting: Internal Medicine

## 2021-10-31 NOTE — Telephone Encounter (Signed)
Pt says he stopped [name] on 10/04/21 when he saw Dr Caryl Comes.... he has been having regular Afib... over the past few day he has been having some dizziness with his palpitations.... he says he he is in Afib/ flutter continuous according to his monitor at home...   Today he is feeling well... he is asking if he should start back on [name]... I advised him to hold off until we speak to Dr Caryl Comes... I also asked him to try and check his BP and HR... he say he does not feel like his heart is "racing".

## 2021-11-02 ENCOUNTER — Encounter: Payer: Self-pay | Admitting: Internal Medicine

## 2021-11-02 NOTE — Telephone Encounter (Signed)
AT/ AF burden 100%. Ventricular rates appear well controlled. Routing to Dr. Caryl Comes for follow up.

## 2021-11-02 NOTE — Telephone Encounter (Signed)
Recurrent episodes, duration about 5 minutes--- dizzy, if standing need to sit, always standing.  No meds changed of late x stopping dofetilide -- will use BP monitor to detect BP as I suspect this is hypotension  And Whether It Is Secondary to an Arrhythmia or Not Remains to Be Determined

## 2021-11-04 ENCOUNTER — Ambulatory Visit (INDEPENDENT_AMBULATORY_CARE_PROVIDER_SITE_OTHER): Payer: Medicare Other

## 2021-11-04 DIAGNOSIS — G459 Transient cerebral ischemic attack, unspecified: Secondary | ICD-10-CM

## 2021-11-06 LAB — CUP PACEART REMOTE DEVICE CHECK
Battery Remaining Longevity: 2 mo
Battery Voltage: 2.74 V
Brady Statistic RA Percent Paced: 1.21 %
Brady Statistic RV Percent Paced: 86.62 %
Date Time Interrogation Session: 20231006022606
HighPow Impedance: 42 Ohm
HighPow Impedance: 58 Ohm
Implantable Lead Implant Date: 20040309
Implantable Lead Implant Date: 20040309
Implantable Lead Location: 753859
Implantable Lead Location: 753860
Implantable Lead Model: 158
Implantable Lead Model: 5076
Implantable Lead Serial Number: 115061
Implantable Pulse Generator Implant Date: 20161007
Lead Channel Impedance Value: 361 Ohm
Lead Channel Impedance Value: 361 Ohm
Lead Channel Impedance Value: 4047 Ohm
Lead Channel Impedance Value: 4047 Ohm
Lead Channel Impedance Value: 4047 Ohm
Lead Channel Impedance Value: 4047 Ohm
Lead Channel Impedance Value: 4047 Ohm
Lead Channel Impedance Value: 4047 Ohm
Lead Channel Impedance Value: 4047 Ohm
Lead Channel Impedance Value: 4047 Ohm
Lead Channel Impedance Value: 4047 Ohm
Lead Channel Impedance Value: 4047 Ohm
Lead Channel Impedance Value: 418 Ohm
Lead Channel Pacing Threshold Amplitude: 0.625 V
Lead Channel Pacing Threshold Amplitude: 0.625 V
Lead Channel Pacing Threshold Pulse Width: 0.4 ms
Lead Channel Pacing Threshold Pulse Width: 0.4 ms
Lead Channel Sensing Intrinsic Amplitude: 1 mV
Lead Channel Sensing Intrinsic Amplitude: 1 mV
Lead Channel Sensing Intrinsic Amplitude: 4.125 mV
Lead Channel Sensing Intrinsic Amplitude: 4.125 mV
Lead Channel Setting Pacing Amplitude: 2 V
Lead Channel Setting Pacing Amplitude: 2.5 V
Lead Channel Setting Pacing Pulse Width: 0.4 ms
Lead Channel Setting Sensing Sensitivity: 0.3 mV

## 2021-11-07 ENCOUNTER — Other Ambulatory Visit: Payer: Self-pay | Admitting: Cardiovascular Disease

## 2021-11-07 ENCOUNTER — Ambulatory Visit: Payer: Medicare Other | Attending: Cardiovascular Disease | Admitting: Cardiovascular Disease

## 2021-11-07 ENCOUNTER — Encounter: Payer: Self-pay | Admitting: Cardiovascular Disease

## 2021-11-07 VITALS — BP 98/72 | HR 84 | Ht 73.0 in | Wt 197.6 lb

## 2021-11-07 DIAGNOSIS — I4819 Other persistent atrial fibrillation: Secondary | ICD-10-CM | POA: Insufficient documentation

## 2021-11-07 DIAGNOSIS — I5022 Chronic systolic (congestive) heart failure: Secondary | ICD-10-CM | POA: Insufficient documentation

## 2021-11-07 DIAGNOSIS — I4891 Unspecified atrial fibrillation: Secondary | ICD-10-CM

## 2021-11-07 DIAGNOSIS — E782 Mixed hyperlipidemia: Secondary | ICD-10-CM | POA: Diagnosis not present

## 2021-11-07 DIAGNOSIS — I7143 Infrarenal abdominal aortic aneurysm, without rupture: Secondary | ICD-10-CM | POA: Insufficient documentation

## 2021-11-07 DIAGNOSIS — I2510 Atherosclerotic heart disease of native coronary artery without angina pectoris: Secondary | ICD-10-CM | POA: Diagnosis not present

## 2021-11-07 NOTE — Patient Instructions (Signed)
Medication Instructions:  Your physician recommends that you continue on your current medications as directed. Please refer to the Current Medication list given to you today.  *If you need a refill on your cardiac medications before your next appointment, please call your pharmacy*   Lab Work: NONE If you have labs (blood work) drawn today and your tests are completely normal, you will receive your results only by: [place] (if you have MyChart) OR A paper copy in the mail If you have any lab test that is abnormal or we need to change your treatment, we will call you to review the results.   Testing/Procedures: ECHO (prior to next visit in 1 year) Your physician has requested that you have an echocardiogram. Echocardiography is a painless test that uses sound waves to create images of your heart. It provides your doctor with information about the size and shape of your heart and how well your heart's chambers and valves are working. This procedure takes approximately one hour. There are no restrictions for this procedure.  Follow-Up: At [facility], you and your health needs are our priority.  As part of our continuing mission to provide you with exceptional heart care, we have created designated Provider Care Teams.  These Care Teams include your primary Cardiologist (physician) and Advanced Practice Providers (APPs -  Physician Assistants and Nurse Practitioners) who all work together to provide you with the care you need, when you need it.  Your next appointment:   1 year(s)  The format for your next appointment:   In Person  Provider:   Sherren Mocha, MD       Important Information About Sugar

## 2021-11-07 NOTE — Progress Notes (Signed)
Cardiology Office Note:    Date:  11/07/2021   ID:  [name], DOB 12/11/1931, MRN [id number]  PCP:  Sueanne Margarita, [place] [facility] Cardiologist:  Sherren Mocha, MD Electrophysiologist:  [name], MD     Referring MD: Sueanne Margarita, DO   Chief Complaint  Patient presents with   Atrial Fibrillation   Coronary Artery Disease    History of Present Illness:    [name] is a 88 y.o. male with a hx of: Coronary artery disease  Mitral valve disease S/p CABG, MV repair  Cath 12/18: oLAD 103, pLAD 50, oD1 50; oLCx 60, OM2 95; RCA irregs; R Radial-OM2 and L-LAD patent >> Med rx  Hypertension  Diabetes mellitus  Persistent atrial fibrillation  Dofetilide Rx, Apixaban Rx  (HFrEF) heart failure with reduced ejection fraction  Echocardiogram 10/2018: EF 35-40 Echocardiogram 4/22: EF 35-40  Pulmonary hypertension  Echocardiogram 4/22: RVSP 45.5  Ventricular tachycardia  S/p ICD  Carotid artery disease S/p R CEA  Abdominal aortic aneurysm  CT 12/2018 5.7 cm (followed by vasc surg) -to be managed conservatively  The patient is here alone today.  His atrial fibs is now chronic and persistent with a 100% burden.  He has been followed closely by Dr. Caryl Comes over the past year since I have seen him.  The patient has decided against repair of his abdominal aortic aneurysm and his advanced age.  He has mild exertional dyspnea which has not changed over recent years.  No orthopnea, PND, abdominal swelling, leg swelling, or chest pain.  He does have occasional postural dizziness that he associates with low blood pressure.  He is going to monitor his blood pressure when this occurs in the future.  He has not had frank syncope.  Past Medical History:  Diagnosis Date   Acute on chronic combined systolic (congestive) and diastolic (congestive) heart failure (HCC)    AICD (automatic cardioverter/defibrillator) present    [employer] PPM/ICD   Carotid  stenosis, right    s/p endarterectomy 11/07/18   Chronic combined systolic (congestive) and diastolic (congestive) heart failure (HCC)    Coronary artery disease    [facility]   Diabetes mellitus    Dysrhythmia    Endocarditis, valve unspecified    Mitral   Headache    hx migraines 6-7 x mo   History of migraine headaches    History of seasonal allergies    ICD (implantable cardiac defibrillator) in place    Optic nerve hemorrhage, 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  We will monitor this closely and look for changes over the ensuing 6 months.  Repeat visit here in 6 months   PAF (paroxysmal atrial fibrillation) (HCC)    NO LAA occlusion at the time of surgery  [name] 2018   Pleural effusion    PVC's (premature ventricular contractions)    Ventricular tachycardia, polymorphic ([place])    status post ICD implantation    Past Surgical History:  Procedure Laterality Date   BRONCHIAL BIOPSY  07/22/2020   Procedure: BRONCHIAL BIOPSIES;  Surgeon: Collene Gobble, MD;  Location: [facility];  Service: Cardiopulmonary;;   BRONCHIAL BRUSHINGS  07/22/2020   Procedure: BRONCHIAL BRUSHINGS;  Surgeon: Collene Gobble, MD;  Location: [facility];  Service: Cardiopulmonary;;   BRONCHIAL WASHINGS  07/22/2020   Procedure: BRONCHIAL WASHINGS;  Surgeon: Collene Gobble,  MD;  Location: [facility];  Service: Cardiopulmonary;;   CARDIAC CATHETERIZATION  04/03/2007   EF 40%   CARDIAC CATHETERIZATION  02/06/2006   EF 45%. ANTERIOR HYPOKINESIS   CARDIAC CATHETERIZATION  05/29/2000   EF 50%. MILD ANTERIOR HYPOKINESIS   CARDIAC CATHETERIZATION  10/25/1999   EF 50%. SEVERE MITRAL REGURGITATION   CARDIAC CATHETERIZATION  01/06/2003   EF 50%   CARDIAC DEFIBRILLATOR PLACEMENT     CARDIOVERSION N/A 02/07/2019   Procedure: CARDIOVERSION;  Surgeon: Fay Records, MD;  Location: [facility];  Service:  Cardiovascular;  Laterality: N/A;   CARDIOVERSION N/A 11/24/2019   Procedure: CARDIOVERSION;  Surgeon: Josue Hector, MD;  Location: [facility] ENDOSCOPY;  Service: Cardiovascular;  Laterality: N/A;   CATARACT EXTRACTION W/ INTRAOCULAR LENS  IMPLANT, BILATERAL     CHOLECYSTECTOMY N/A 01/08/2019   Procedure: LAPAROSCOPIC CHOLECYSTECTOMY WITH INTRAOPERATIVE CHOLANGIOGRAM;  Surgeon: Donnie Mesa, MD;  Location: [place];  Service: General;  Laterality: N/A;   CORONARY ARTERY BYPASS GRAFT  2001   2 VESSEL CABG AND MITRAL VALVE REPAIR. HE HAD A LIMA GRAFT TO THE LAD AND RADIAL ARTERY  GRAFT TO THE OBTUSE MARGINAL  OF LEFT CIRCUMFLEX   ENDARTERECTOMY Right 11/07/2018   Procedure: ENDARTERECTOMY CAROTID RIGHT;  Surgeon: Waynetta Sandy, MD;  Location: [place];  Service: Vascular;  Laterality: Right;   EP IMPLANTABLE DEVICE N/A 11/06/2014   Procedure: ICD Generator Changeout;  Surgeon: Deboraha Sprang, MD;  Location: [place] [facility];  Service: Cardiovascular;  Laterality: N/A;   ERCP N/A 01/10/2019   Procedure: ENDOSCOPIC RETROGRADE CHOLANGIOPANCREATOGRAPHY (ERCP);  Surgeon: Clarene Essex, MD;  Location: [place];  Service: Endoscopy;  Laterality: N/A;   FOREIGN BODY REMOVAL Right 06/21/2017   Procedure: FOREIGN BODY REMOVAL ADULT RIGHT RING FINGER;  Surgeon: Leanora Cover, MD;  Location: [place];  Service: Orthopedics;  Laterality: Right;   HEMORRHOID SURGERY     HEMORROIDECTOMY     INTRAVASCULAR PRESSURE WIRE/FFR STUDY N/A 01/05/2017   Procedure: INTRAVASCULAR PRESSURE WIRE/FFR STUDY;  Surgeon: Sherren Mocha, MD;  Location: [place] [facility];  Service: Cardiovascular;  Laterality: N/A;   KNEE ARTHROSCOPY     RIGHT KNEE   LAPAROSCOPIC APPENDECTOMY N/A 01/08/2019   Procedure: APPENDECTOMY LAPAROSCOPIC;  Surgeon: Donnie Mesa, MD;  Location: [place];  Service: General;  Laterality: N/A;   LEFT HEART CATHETERIZATION WITH CORONARY ANGIOGRAM N/A 07/04/2012   Procedure: LEFT HEART CATHETERIZATION WITH  CORONARY ANGIOGRAM;  Surgeon: Sherren Mocha, MD;  Location: [facility] CATH LAB;  Service: Cardiovascular;  Laterality: N/A;   MITRAL VALVE REPLACEMENT     No LAA occlusion at the time of surgery  [name] report 2018   REMOVAL OF STONES  01/10/2019   Procedure: REMOVAL OF STONES;  Surgeon: Clarene Essex, MD;  Location: [place];  Service: Endoscopy;;   RIGHT/LEFT HEART CATH AND CORONARY/GRAFT ANGIOGRAPHY N/A 01/05/2017   Procedure: RIGHT/LEFT HEART CATH AND CORONARY/GRAFT ANGIOGRAPHY;  Surgeon: Sherren Mocha, MD;  Location: [place] CV [facility];  Service: Cardiovascular;  Laterality: N/A;   SPHINCTEROTOMY  01/10/2019   Procedure: SPHINCTEROTOMY;  Surgeon: Clarene Essex, MD;  Location: [place];  Service: Endoscopy;;   TRANSTHORACIC ECHOCARDIOGRAM  04/02/2007   EF 35%   VIDEO BRONCHOSCOPY Right 07/22/2020   Procedure: VIDEO BRONCHOSCOPY WITH FLUORO, BAL AND BIOPSY;  Surgeon: Collene Gobble, MD;  Location: [facility];  Service: Cardiopulmonary;  Laterality: Right;  CONSCIOUS SEDATION OR MAC    Current Medications: Current Meds  Medication Sig   albuterol (VENTOLIN HFA)  108 (90 Base) MCG/ACT inhaler Inhale 2 puffs into the lungs every 6 (six) hours as needed for wheezing or shortness of breath.   atorvastatin (LIPITOR) 80 MG tablet TAKE ONE TABLET BY MOUTH EVERY DAY FOR CHOLESTEROL   cetirizine (ZYRTEC) 10 MG tablet Take 10 mg by mouth in the morning.   ELIQUIS 5 MG TABS tablet TAKE ONE TABLET BY MOUTH TWICE DAILY   empagliflozin (JARDIANCE) 10 MG TABS tablet Take 10 mg by mouth in the morning.   EPIPEN 2-PAK 0.3 MG/0.3ML SOAJ injection Inject 0.3 mg into the muscle daily as needed for anaphylaxis.    Erenumab-aooe (AIMOVIG) 140 MG/ML SOAJ Inject 140 mg into the skin every 28 (twenty-eight) days.   fluticasone (FLONASE) 50 MCG/ACT nasal spray Place 1 spray into the nose daily as needed for rhinitis or allergies.   ibuprofen (ADVIL) 200 MG tablet Take 400 mg by mouth every 8 (eight) hours as needed  (headaches/migraine).   Magnesium 250 MG TABS Take 250 mg by mouth in the morning.   metoprolol succinate (TOPROL-XL) 25 MG 24 hr tablet TAKE ONE TABLET BY MOUTH TWICE DAILY WITH OR IMMEDIATELY FOLLOWING A MEAL   Multiple Vitamin (MULTIVITAMIN WITH MINERALS) TABS tablet Take 1 tablet by mouth at bedtime. Centrum Silver   Multiple Vitamins-Minerals (PRESERVISION AREDS 2) CAPS as directed Orally   nateglinide (STARLIX) 60 MG tablet Take 1 tablet twice a day before meals for diabetes   ONETOUCH VERIO test strip SMARTSIG:Via Meter 1-2 Times Daily   sitaGLIPtin (JANUVIA) 100 MG tablet Take 100 mg by mouth at bedtime.   Tiotropium Bromide-Olodaterol (STIOLTO RESPIMAT) 2.5-2.5 MCG/ACT AERS Inhale 2 puffs into the lungs at bedtime.     Allergies:   Bee venom, Latex, Metformin, Rivaroxaban, Sotalol, Amiodarone, Levofloxacin, and Warfarin sodium   Social History   Socioeconomic History   Marital status: Married    Spouse name: Mardene Celeste   Number of children: 2   Years of education: Not on file   Highest education level: Associate degree: academic program  Occupational History   Occupation: retired  Tobacco Use   Smoking status: Former    Packs/day: 2.00    Years: 16.00    Total pack years: 32.00    Types: Cigarettes    Start date: 02/23/1956    Quit date: 01/31/1971    Years since quitting: 50.8   Smokeless tobacco: Never  Vaping Use   Vaping Use: Never used  Substance and Sexual Activity   Alcohol use: Yes    Alcohol/week: 0.0 standard drinks of alcohol    Comment: 1-2 a week   Drug use: No   Sexual activity: Yes  Other Topics Concern   Not on file  Social History Narrative   Patient is right-handed. He lives with his wife ina 2 story home. He drinks one cup of coffee and a diet coke a day.    Social Determinants of Health   Financial Resource Strain: Not on file  Food Insecurity: Not on file  Transportation Needs: Not on file  Physical Activity: Not on file  Stress: Not on  file  Social Connections: Not on file     Family History: The patient's family history includes Cancer in his sister and another family member; Diabetes in an other family member; Emphysema in his father; Heart disease in his father, paternal grandfather, and paternal grandmother; Stroke in his mother.  ROS:   Please see the history of present illness.    All other systems reviewed and are  negative.  EKGs/Labs/Other Studies Reviewed:    The following studies were reviewed today: Echo 05/06/2020: 1. Global hypokinesis worse in the mid-apical septal and apical  myocardium. Left ventricular ejection fraction, by estimation, is 35 to  40%. The left ventricle has moderately decreased function. The left  ventricle demonstrates global hypokinesis. There  is mild left ventricular hypertrophy of the posterior segment. Left  ventricular diastolic parameters are indeterminate. Elevated left  ventricular end-diastolic pressure.   2. Right ventricular systolic function is mildly reduced. The right  ventricular size is normal. There is moderately elevated pulmonary artery  systolic pressure.   3. Left atrial size was severely dilated.   4. Right atrial size was severely dilated.   5. Mean mitral valve gradient unchanged from 10/2018. The mitral valve is  degenerative. Trivial mitral valve regurgitation. Mild mitral stenosis.  The mean mitral valve gradient is 3.0 mmHg.   6. Tricuspid valve regurgitation is mild to moderate.   7. The aortic valve is normal in structure. There is mild calcification  of the aortic valve. There is mild thickening of the aortic valve. Aortic  valve regurgitation is not visualized. No aortic stenosis is present.   8. Aortic dilatation noted. There is mild dilatation of the aortic root,  measuring 40 mm. There is mild dilatation of the ascending aorta,  measuring 37 mm.   9. The inferior vena cava is normal in size with greater than 50%  respiratory variability,  suggesting right atrial pressure of 3 mmHg.   EKG:  EKG is not ordered today.    Recent Labs: No results found for requested labs within last 365 days.  Recent Lipid Panel    Component Value Date/Time   CHOL 110 11/05/2018 0256   TRIG 90 11/05/2018 0256   HDL 48 11/05/2018 0256   CHOLHDL 2.3 11/05/2018 0256   VLDL 18 11/05/2018 0256   LDLCALC 44 11/05/2018 0256     Risk Assessment/Calculations:    CHA2DS2-VASc Score = 5   This indicates a 7.2% annual risk of stroke. The patient's score is based upon: CHF History: 1 HTN History: 0 Diabetes History: 1 Stroke History: 0 Vascular Disease History: 1 Age Score: 2 Gender Score: 0               Physical Exam:    VS:  BP 98/72 (BP Location: Left Arm, Patient Position: Sitting, Cuff Size: Normal)   Pulse 84   Ht _0  (1.854 m)   Wt 197 lb 9.6 oz (89.6 kg)   SpO2 94%   BMI 26.07 kg/m     Wt Readings from Last 3 Encounters:  11/07/21 197 lb 9.6 oz (89.6 kg)  10/04/21 196 lb (88.9 kg)  05/12/21 194 lb (88 kg)     GEN:  Well nourished, well developed in no acute distress HEENT: Normal NECK: No JVD; No carotid bruits LYMPHATICS: No lymphadenopathy CARDIAC: irregular, no murmurs, rubs, gallops RESPIRATORY:  Clear to auscultation without rales, wheezing or rhonchi  ABDOMEN: Soft, non-tender, non-distended MUSCULOSKELETAL:  No edema; No deformity  SKIN: Warm and dry NEUROLOGIC:  Alert and oriented x 3 PSYCHIATRIC:  Normal affect   ASSESSMENT:    1. Persistent atrial fibrillation ([place])   2. Chronic systolic heart failure ([place])   3. Coronary artery disease involving native coronary artery of native heart without angina pectoris   4. Mixed hyperlipidemia   5. Infrarenal abdominal aortic aneurysm (AAA) without rupture (HCC)    PLAN:    In order of  problems listed above:  The patient is gone on to develop persistent atrial fibrillation.  He is anticoagulated with apixaban.  Dofetilide has been discontinued.  He  is followed closely by Dr. Caryl Comes and will likely need a generator change in the next several months.  I will see him back in 1 year for follow-up evaluation. Clinically stable with NYHA functional class II symptoms.  He is treated with metoprolol succinate and Jardiance.  His blood pressure remains low and I do not think he will tolerate further GDMT titration at this time.  LVEF reviewed over serial echo studies and has arranged between 30 and 45% consistently over the past decade. Treated with apixaban and atorvastatin.  No antiplatelet therapy in the context of chronic oral anticoagulation.  No angina at this time. Lipids are excellent on atorvastatin with an LDL cholesterol of 60. Manage conservatively.  Notes reviewed and patient does not wish to move forward with repair.     Medication Adjustments/Labs and Tests Ordered: Current medicines are reviewed at length with the patient today.  Concerns regarding medicines are outlined above.  Orders Placed This Encounter  Procedures   ECHOCARDIOGRAM COMPLETE   No orders of the defined types were placed in this encounter.   Patient Instructions  Medication Instructions:  Your physician recommends that you continue on your current medications as directed. Please refer to the Current Medication list given to you today.  *If you need a refill on your cardiac medications before your next appointment, please call your pharmacy*   Lab Work: NONE If you have labs (blood work) drawn today and your tests are completely normal, you will receive your results only by: [place] (if you have MyChart) OR A paper copy in the mail If you have any lab test that is abnormal or we need to change your treatment, we will call you to review the results.   Testing/Procedures: ECHO (prior to next visit in 1 year) Your physician has requested that you have an echocardiogram. Echocardiography is a painless test that uses sound waves to create images of  your heart. It provides your doctor with information about the size and shape of your heart and how well your heart's chambers and valves are working. This procedure takes approximately one hour. There are no restrictions for this procedure.  Follow-Up: At [facility], you and your health needs are our priority.  As part of our continuing mission to provide you with exceptional heart care, we have created designated Provider Care Teams.  These Care Teams include your primary Cardiologist (physician) and Advanced Practice Providers (APPs -  Physician Assistants and Nurse Practitioners) who all work together to provide you with the care you need, when you need it.  Your next appointment:   1 year(s)  The format for your next appointment:   In Person  Provider:   Sherren Mocha, MD       Important Information About Sugar         Signed, Sherren Mocha, MD  11/07/2021 4:25 PM    [place]

## 2021-11-08 NOTE — Progress Notes (Signed)
Remote ICD transmission.   

## 2021-11-09 NOTE — Addendum Note (Signed)
Addended by: Douglass Rivers D on: 11/09/2021 12:27 PM   Modules accepted: Level of Service

## 2021-11-09 NOTE — Progress Notes (Signed)
Remote ICD transmission.   

## 2021-11-14 ENCOUNTER — Encounter: Payer: Self-pay | Admitting: Internal Medicine
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
## 2021-11-16 ENCOUNTER — Telehealth: Payer: Self-pay

## 2021-11-16 ENCOUNTER — Encounter: Payer: Self-pay | Admitting: Neurology

## 2021-11-16 ENCOUNTER — Ambulatory Visit (INDEPENDENT_AMBULATORY_CARE_PROVIDER_SITE_OTHER): Payer: Medicare Other | Admitting: Neurology

## 2021-11-16 VITALS — BP 92/60 | HR 79 | Ht 73.0 in | Wt 194.0 lb

## 2021-11-16 DIAGNOSIS — G43109 Migraine with aura, not intractable, without status migrainosus: Secondary | ICD-10-CM | POA: Diagnosis not present

## 2021-11-16 DIAGNOSIS — I2510 Atherosclerotic heart disease of native coronary artery without angina pectoris: Secondary | ICD-10-CM

## 2021-11-16 MED ORDER — AIMOVIG 140 MG/ML ~~LOC~~ SOAJ: 140.0000 mg | SUBCUTANEOUS | 11 refills | Status: DC

## 2021-11-16 NOTE — Telephone Encounter (Signed)
[place] form filled out and fax [phone]

## 2021-11-16 NOTE — Patient Instructions (Signed)
Continue Aimovig

## 2021-11-21 ENCOUNTER — Encounter: Payer: Self-pay | Admitting: Internal Medicine

## 2021-12-06 ENCOUNTER — Ambulatory Visit (INDEPENDENT_AMBULATORY_CARE_PROVIDER_SITE_OTHER): Payer: Medicare Other

## 2021-12-06 DIAGNOSIS — I4729 Other ventricular tachycardia: Secondary | ICD-10-CM

## 2021-12-06 LAB — CUP PACEART REMOTE DEVICE CHECK
Battery Remaining Longevity: 1 mo
Battery Voltage: 2.74 V
Brady Statistic RA Percent Paced: 1.1 %
Brady Statistic RV Percent Paced: 87.13 %
Date Time Interrogation Session: 20231107043621
HighPow Impedance: 42 Ohm
HighPow Impedance: 55 Ohm
Implantable Lead Connection Status: 753985
Implantable Lead Connection Status: 753985
Implantable Lead Implant Date: 20040309
Implantable Lead Implant Date: 20040309
Implantable Lead Location: 753859
Implantable Lead Location: 753860
Implantable Lead Model: 158
Implantable Lead Model: 5076
Implantable Lead Serial Number: 115061
Implantable Pulse Generator Implant Date: 20161007
Lead Channel Impedance Value: 361 Ohm
Lead Channel Impedance Value: 399 Ohm
Lead Channel Impedance Value: 4047 Ohm
Lead Channel Impedance Value: 4047 Ohm
Lead Channel Impedance Value: 4047 Ohm
Lead Channel Impedance Value: 4047 Ohm
Lead Channel Impedance Value: 4047 Ohm
Lead Channel Impedance Value: 4047 Ohm
Lead Channel Impedance Value: 4047 Ohm
Lead Channel Impedance Value: 4047 Ohm
Lead Channel Impedance Value: 4047 Ohm
Lead Channel Impedance Value: 4047 Ohm
Lead Channel Impedance Value: 418 Ohm
Lead Channel Pacing Threshold Amplitude: 0.625 V
Lead Channel Pacing Threshold Amplitude: 0.75 V
Lead Channel Pacing Threshold Pulse Width: 0.4 ms
Lead Channel Pacing Threshold Pulse Width: 0.4 ms
Lead Channel Sensing Intrinsic Amplitude: 0.75 mV
Lead Channel Sensing Intrinsic Amplitude: 0.75 mV
Lead Channel Sensing Intrinsic Amplitude: 7.375 mV
Lead Channel Sensing Intrinsic Amplitude: 7.375 mV
Lead Channel Setting Pacing Amplitude: 2 V
Lead Channel Setting Pacing Amplitude: 2.5 V
Lead Channel Setting Pacing Pulse Width: 0.4 ms
Lead Channel Setting Sensing Sensitivity: 0.3 mV

## 2021-12-26 ENCOUNTER — Encounter: Payer: Self-pay | Admitting: Internal Medicine

## 2021-12-28 ENCOUNTER — Ambulatory Visit: Payer: Medicare Other | Attending: Internal Medicine | Admitting: Internal Medicine

## 2021-12-28 ENCOUNTER — Encounter: Payer: Self-pay | Admitting: Internal Medicine

## 2021-12-28 VITALS — BP 97/67 | HR 81 | Wt 202.0 lb

## 2021-12-28 DIAGNOSIS — Z01812 Encounter for preprocedural laboratory examination: Secondary | ICD-10-CM | POA: Diagnosis not present

## 2021-12-28 DIAGNOSIS — I4729 Other ventricular tachycardia: Secondary | ICD-10-CM | POA: Insufficient documentation

## 2021-12-28 DIAGNOSIS — Z95810 Presence of automatic (implantable) cardiac defibrillator: Secondary | ICD-10-CM | POA: Diagnosis not present

## 2021-12-28 DIAGNOSIS — I4819 Other persistent atrial fibrillation: Secondary | ICD-10-CM | POA: Diagnosis not present

## 2021-12-28 DIAGNOSIS — I2510 Atherosclerotic heart disease of native coronary artery without angina pectoris: Secondary | ICD-10-CM

## 2021-12-28 DIAGNOSIS — I493 Ventricular premature depolarization: Secondary | ICD-10-CM | POA: Diagnosis not present

## 2021-12-28 DIAGNOSIS — I5022 Chronic systolic (congestive) heart failure: Secondary | ICD-10-CM | POA: Insufficient documentation

## 2021-12-28 NOTE — Progress Notes (Signed)
Patient Care Team: Sueanne Margarita, DO as PCP - General (Internal Medicine) Sherren Mocha, MD as PCP - Cardiology (Cardiology) Deboraha Sprang, MD as PCP - Electrophysiology (Cardiology) Pieter Partridge, DO as Consulting Physician (Neurology)   HPI  [name] is a 88 y.o. male Seen in followup for polymorphic ventricular tachycardia for which he has an ICD implantation 2004 with generator change 2010, 2016.  His device is reached ERI  Also persistent atrial fibrillation which was quite symptomatic and prompted a visit to the A. fib clinic with the plan for anticoagulation and TEE cardioversion.  Currently maintained on Eliquis and dofetilide  Last device implanted with CRT but no LV lead was placed.  History of coronary disease with prior CABG mitral valve repair both of which were done at [facility]  PFO closure at that time.     AFib assoc with slowish Ventricular response but with dyspnea on exertion and fatigue.  He is quite aware of when he is in A. fib and when he is not.  Interval issues include 1-none resolving right upper lobe pneumonia for which he underwent bronchoscopy 6/22 cytology negative 2-expanding abdominal aortic aneurysm--repair in the past of been recommended.  Phone call note 06/21/2020 referred to repeat vascular surgery clinic evaluation for which I do not see a note-- discussion regarding his choosing not to pursue surgical relief of his AAA,  he is at peace with the potential of his dying suddenly.  The patient denies chest pain, nocturnal dyspnea, orthopnea or peripheral edema.  There have been no palpitations, lightheadedness or syncope.  Complains of mild dyspnea on exertion which has been stable.   Antiarrhythmics Date Reason stopped  dofetilide 9/20  ongoing   Amiodarone / Tried years ago following CABG stopped but pt does not recall adverse effects      DATE TEST EF   6/14 LHC 35-40% LIMA/ RIMA patent   2/17 Echo   30 %   12/18 LHC    LIMA-LAD and right radial graft-OM- patent proximal cx stenosis with negative FFR  11/19 Echo  35-40% LAE Severe (69/3.0/63) Pulm HTN 50  10/20 Echo  35-40% BAE  Pulm NTH 41 mm  4/22 Echo  35-40% BAE  PA sys 45  5/22 CTA-abd  AAA 6.2 <<5.7 cm        Date Cr K Mg Hgb  9/16  0.99 4.2 2.1   12/17  1.08 4.6 2.1   5/19 1.00 4.5 2.2   10/19 1.04 4.4  13.2 (11/19)  1/21  0.9 4.3 1.9 13.4  2/22 1.04 4.5 1.9 12.2<<13.9  6/22 0.93 4.8  14.1  8/23 0.98 5.0         Device History: STJ dual chamber ICD implanted 2004 for PMVT, ICM; generator change 2010; gen change 2016 (MDT device) History of appropriate therapy: Yes History of AAD therapy: Yes Thromboembolic risk factors ( age  -2, HTN-1, TIA/CVA-2, Vasc disease -1, CHF-1) for a CHADSVASc Score of >=7   Past Medical History:  Diagnosis Date   Acute on chronic combined systolic (congestive) and diastolic (congestive) heart failure (HCC)    AICD (automatic cardioverter/defibrillator) present    Medtronicn PPM/ICD   Carotid stenosis, right    s/p endarterectomy 11/07/18   Chronic combined systolic (congestive) and diastolic (congestive) heart failure ([place])    Coronary artery disease    [facility]   Diabetes mellitus    Dysrhythmia    Endocarditis, valve unspecified    Mitral  Headache    hx migraines 6-7 x mo   History of migraine headaches    History of seasonal allergies    ICD (implantable cardiac defibrillator) in place    Optic nerve hemorrhage, 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  We will monitor this closely and look for changes over the ensuing 6 months.  Repeat visit here in 6 months   PAF (paroxysmal atrial fibrillation) (HCC)    NO LAA occlusion at the time of surgery  [name] 2018   Pleural effusion    PVC's (premature ventricular contractions)    Ventricular tachycardia, polymorphic ([place])    status  post ICD implantation    Past Surgical History:  Procedure Laterality Date   BRONCHIAL BIOPSY  07/22/2020   Procedure: BRONCHIAL BIOPSIES;  Surgeon: Collene Gobble, MD;  Location: [facility] ENDOSCOPY;  Service: Cardiopulmonary;;   BRONCHIAL BRUSHINGS  07/22/2020   Procedure: BRONCHIAL BRUSHINGS;  Surgeon: Collene Gobble, MD;  Location: [facility];  Service: Cardiopulmonary;;   BRONCHIAL WASHINGS  07/22/2020   Procedure: BRONCHIAL WASHINGS;  Surgeon: Collene Gobble, MD;  Location: [facility] ENDOSCOPY;  Service: Cardiopulmonary;;   CARDIAC CATHETERIZATION  04/03/2007   EF 40%   CARDIAC CATHETERIZATION  02/06/2006   EF 45%. ANTERIOR HYPOKINESIS   CARDIAC CATHETERIZATION  05/29/2000   EF 50%. MILD ANTERIOR HYPOKINESIS   CARDIAC CATHETERIZATION  10/25/1999   EF 50%. SEVERE MITRAL REGURGITATION   CARDIAC CATHETERIZATION  01/06/2003   EF 50%   CARDIAC DEFIBRILLATOR PLACEMENT     CARDIOVERSION N/A 02/07/2019   Procedure: CARDIOVERSION;  Surgeon: Fay Records, MD;  [facility] ENDOSCOPY;  Service: Cardiovascular;  Laterality: N/A;   CARDIOVERSION N/A 11/24/2019   Procedure: CARDIOVERSION;  Surgeon: Josue Hector, MD;  Location: [facility] ENDOSCOPY;  Service: Cardiovascular;  Laterality: N/A;   CATARACT EXTRACTION W/ INTRAOCULAR LENS  IMPLANT, BILATERAL     CHOLECYSTECTOMY N/A 01/08/2019   Procedure: LAPAROSCOPIC CHOLECYSTECTOMY WITH INTRAOPERATIVE CHOLANGIOGRAM;  Surgeon: Donnie Mesa, MD;  Location: [place];  Service: General;  Laterality: N/A;   CORONARY ARTERY BYPASS GRAFT  2001   2 VESSEL CABG AND MITRAL VALVE REPAIR. HE HAD A LIMA GRAFT TO THE LAD AND RADIAL ARTERY  GRAFT TO THE OBTUSE MARGINAL  OF LEFT CIRCUMFLEX   ENDARTERECTOMY Right 11/07/2018   Procedure: ENDARTERECTOMY CAROTID RIGHT;  Surgeon: Waynetta Sandy, MD;  Location: [place];  Service: Vascular;  Laterality: Right;   EP IMPLANTABLE DEVICE N/A 11/06/2014   Procedure: ICD Generator Changeout;  Surgeon: Deboraha Sprang, MD;  Location: [place] CV LAB;  Service: Cardiovascular;  Laterality: N/A;   ERCP N/A 01/10/2019   Procedure: ENDOSCOPIC RETROGRADE CHOLANGIOPANCREATOGRAPHY (ERCP);  Surgeon: Clarene Essex, MD;  Location: [place];  Service: Endoscopy;  Laterality: N/A;   FOREIGN BODY REMOVAL Right 06/21/2017   Procedure: FOREIGN BODY REMOVAL ADULT RIGHT RING FINGER;  Surgeon: Leanora Cover, MD;  Location: [place];  Service: Orthopedics;  Laterality: Right;   HEMORRHOID SURGERY     HEMORROIDECTOMY     INTRAVASCULAR PRESSURE WIRE/FFR STUDY N/A 01/05/2017   Procedure: INTRAVASCULAR PRESSURE WIRE/FFR STUDY;  Surgeon: Sherren Mocha, MD;  Location: [place] CV LAB;  Service: Cardiovascular;  Laterality: N/A;   KNEE ARTHROSCOPY     RIGHT KNEE   LAPAROSCOPIC APPENDECTOMY N/A 01/08/2019   Procedure: APPENDECTOMY LAPAROSCOPIC;  Surgeon: Donnie Mesa, MD;  Location: [place];  Service: General;  Laterality:  N/A;   LEFT HEART CATHETERIZATION WITH CORONARY ANGIOGRAM N/A 07/04/2012   Procedure: LEFT HEART CATHETERIZATION WITH CORONARY ANGIOGRAM;  Surgeon: Sherren Mocha, MD;  Location: [facility] CATH LAB;  Service: Cardiovascular;  Laterality: N/A;   MITRAL VALVE REPLACEMENT     No LAA occlusion at the time of surgery  Karie Soda report 2018   REMOVAL OF STONES  01/10/2019   Procedure: REMOVAL OF STONES;  Surgeon: Clarene Essex, MD;  Location: [place];  Service: Endoscopy;;   RIGHT/LEFT HEART CATH AND CORONARY/GRAFT ANGIOGRAPHY N/A 01/05/2017   Procedure: RIGHT/LEFT HEART CATH AND CORONARY/GRAFT ANGIOGRAPHY;  Surgeon: Sherren Mocha, MD;  Location: [place] CV LAB;  Service: Cardiovascular;  Laterality: N/A;   SPHINCTEROTOMY  01/10/2019   Procedure: SPHINCTEROTOMY;  Surgeon: Clarene Essex, MD;  Location: [place];  Service: Endoscopy;;   TRANSTHORACIC ECHOCARDIOGRAM  04/02/2007   EF 35%   VIDEO BRONCHOSCOPY Right 07/22/2020   Procedure: VIDEO BRONCHOSCOPY WITH FLUORO, BAL AND BIOPSY;  Surgeon: Collene Gobble, MD;  Location: [facility] ENDOSCOPY;   Service: Cardiopulmonary;  Laterality: Right;  CONSCIOUS SEDATION OR MAC    Current Outpatient Medications  Medication Sig Dispense Refill   albuterol (VENTOLIN HFA) 108 (90 Base) MCG/ACT inhaler Inhale 2 puffs into the lungs every 6 (six) hours as needed for wheezing or shortness of breath. 8 g 5   atorvastatin (LIPITOR) 80 MG tablet TAKE ONE TABLET BY MOUTH EVERY DAY FOR CHOLESTEROL 90 tablet 3   cetirizine (ZYRTEC) 10 MG tablet Take 10 mg by mouth in the morning.     ELIQUIS 5 MG TABS tablet TAKE ONE TABLET BY MOUTH TWICE DAILY 180 tablet 1   empagliflozin (JARDIANCE) 10 MG TABS tablet Take 10 mg by mouth in the morning.     EPIPEN 2-PAK 0.3 MG/0.3ML SOAJ injection Inject 0.3 mg into the muscle daily as needed for anaphylaxis.      Erenumab-aooe (AIMOVIG) 140 MG/ML SOAJ Inject 140 mg into the skin every 28 (twenty-eight) days. 1.12 mL 11   fluticasone (FLONASE) 50 MCG/ACT nasal spray Place 1 spray into the nose daily as needed for rhinitis or allergies.     ibuprofen (ADVIL) 200 MG tablet Take 400 mg by mouth every 8 (eight) hours as needed (headaches/migraine).     Magnesium 250 MG TABS Take 250 mg by mouth in the morning.     metoprolol succinate (TOPROL-XL) 25 MG 24 hr tablet TAKE ONE TABLET BY MOUTH TWICE DAILY WITH OR IMMEDIATELY FOLLOWING A MEAL 180 tablet 3   Multiple Vitamin (MULTIVITAMIN WITH MINERALS) TABS tablet Take 1 tablet by mouth at bedtime. Centrum Silver     Multiple Vitamins-Minerals (PRESERVISION AREDS 2) CAPS as directed Orally     nateglinide (STARLIX) 60 MG tablet Take 1 tablet twice a day before meals for diabetes     ONETOUCH VERIO test strip SMARTSIG:Via Meter 1-2 Times Daily     sitaGLIPtin (JANUVIA) 100 MG tablet Take 100 mg by mouth at bedtime.     Tiotropium Bromide-Olodaterol (STIOLTO RESPIMAT) 2.5-2.5 MCG/ACT AERS Inhale 2 puffs into the lungs at bedtime.     No current facility-administered medications for this visit.    Allergies  Allergen Reactions    Bee Venom Anaphylaxis   Latex Other (See Comments)    REDNESS AT REACTION SITE.   Metformin Diarrhea   Rivaroxaban Other (See Comments)    Headaches, blurred vision   Sotalol Other (See Comments)    "BLUE TOES"- cut his circulation off   Amiodarone  Other reaction(s): rash, doesn't sound like DRESS syndrome.   Levofloxacin     Other reaction(s): long QT syndrome, so avoid   Warfarin Sodium Other (See Comments)    REACTION: migraine headaches/vision impariment    Review of Systems negative except from HPI and PMH  Physical Exam  BP 97/67 (BP Location: Right Arm, Patient Position: Standing)   Pulse 81   Wt 202 lb (91.6 kg)   SpO2 94%   BMI 26.65 kg/m  Well developed and well nourished in no acute distress HENT normal Neck supple with JVP-flat Clear Device pocket well healed; without hematoma or erythema.  There is no tethering  Regular rate and rhythm, no  murmur Abd-soft with active BS No Clubbing cyanosis  edema Skin-warm and dry A & Oriented  Grossly normal sensory and motor function  ECG atrial fibrillation with ventricular pacing at 88 with intermittent PVCs  Device function is normal. Programming changes   See Paceart for details     Assessment and  Plan   Ischemic heart disease with prior bypass ejection 35-40%  Congestive heart failure-chronic-systolic  Implantable defibrillator-Medtronic dual chamber with LV port plugged.   PVCs//Ventricular tachycardia nonsustained     Atrial fibrillation persistent/recurrent  COPD-reactive airways disease  Lengthy discussion about device generator replacement and end-of-life concerns.  At this juncture we will replace his ICD with a high-voltage device with history of appropriate therapy 2006 and 2011.  We discussed the potential timing of an activation of high-voltage therapies if his health were to change.  With his prior TIA, we will hold his anticoagulation for 2 doses prior to device generator replacement.   We have discussed the risk of infection.  We will plan to use an antimicrobial pouch  His heart failure symptoms are relatively minimal, the risk of CRT upgrade as relates to infection is not trivial and so given his functional status being stable over these many years, we will not anticipate generator replacement  No significant lightheadedness with standing his relatively low blood pressure

## 2021-12-28 NOTE — Patient Instructions (Signed)
Medication Instructions:  Your physician recommends that you continue on your current medications as directed. Please refer to the Current Medication list given to you today.  *If you need a refill on your cardiac medications before your next appointment, please call your pharmacy*   Lab Work: None ordered.  If you have labs (blood work) drawn today and your tests are completely normal, you will receive your results only by: [place] (if you have MyChart) OR A paper copy in the mail If you have any lab test that is abnormal or we need to change your treatment, we will call you to review the results.   Testing/Procedures: CRT or cardiac resynchronization therapy is a treatment used to correct heart failure. When you have heart failure your heart is weakened and doesn't pump as well as it should. This therapy may help reduce symptoms and improve the quality of life.  Please see the handout/brochure given to you today to get more information of the different options of therapy.    Follow-Up: At [facility], you and your health needs are our priority.  As part of our continuing mission to provide you with exceptional heart care, we have created designated Provider Care Teams.  These Care Teams include your primary Cardiologist (physician) and Advanced Practice Providers (APPs -  Physician Assistants and Nurse Practitioners) who all work together to provide you with the care you need, when you need it.  We recommend signing up for the patient portal called "MyChart".  Sign up information is provided on this After Visit Summary.  MyChart is used to connect with patients for Virtual Visits (Telemedicine).  Patients are able to view lab/test results, encounter notes, upcoming appointments, etc.  Non-urgent messages can be sent to your provider as well.   To learn more about what you can do with MyChart, go to [email or web address].    Your next appointment:   To be  scheduled  Important Information About Sugar

## 2021-12-30 NOTE — Progress Notes (Signed)
Remote ICD transmission.   

## 2022-01-02 NOTE — Progress Notes (Signed)
[place] forms received from patient, with his form potion filled out.  Paper work placed on [employer] to be signed to be fax off.

## 2022-01-27 ENCOUNTER — Telehealth: Payer: Self-pay

## 2022-01-27 ENCOUNTER — Encounter: Payer: Self-pay | Admitting: Internal Medicine

## 2022-01-27 DIAGNOSIS — I480 Paroxysmal atrial fibrillation: Secondary | ICD-10-CM

## 2022-01-27 DIAGNOSIS — Z01812 Encounter for preprocedural laboratory examination: Secondary | ICD-10-CM

## 2022-01-27 DIAGNOSIS — Z79899 Other long term (current) drug therapy: Secondary | ICD-10-CM

## 2022-01-27 NOTE — Telephone Encounter (Signed)
Pt is aware of his procedure being moved from 02/15/22 to 02/22/22 due to Dr. Caryl Comes having surgery.  He is aware of updated instructions, date/time.

## 2022-01-29 ENCOUNTER — Encounter: Payer: Self-pay | Admitting: Cardiovascular Disease

## 2022-01-30 NOTE — Telephone Encounter (Signed)
Called and discussed with patient regarding amiodarone--no recollection in my first report 2010 and started on amio at CABG 2001 used for 3 weeks in a limited fashion   Will begin amiodarone 400 bid x 2 weeks and then 400 mg daily and can we plan DCCV in 3 weeks  .t Thanks SK

## 2022-01-30 NOTE — Telephone Encounter (Signed)
Can we also start this young man on furosemide 40 mg daily x 5 days then qod ( write 40 daily)  With cMET ( TSH) in anticipation of DCCV

## 2022-01-31 MED ORDER — AMIODARONE HCL 200 MG PO TABS: ORAL_TABLET | ORAL | 0 refills | Status: DC

## 2022-01-31 MED ORDER — FUROSEMIDE 40 MG PO TABS: 40.0000 mg | ORAL_TABLET | Freq: Every day | ORAL | 0 refills | Status: DC

## 2022-01-31 NOTE — Telephone Encounter (Signed)
This My Chart message has been previously addressed.

## 2022-02-02 NOTE — Telephone Encounter (Signed)
Spoke with pt who continues to complain of LEE and SOB.  He is taking Furosemide '40mg'$  every other day and has had increased urination in response to the medication.  He is taking Amiodarone as directed.  DCCV is scheduled for 02/15/2022.  Instruction letter reviewed.  Pt advised to contact office for worsening S/Sx.  Reviewed ED precautions.  Pt verbalizes understanding and agrees with current plan.

## 2022-02-03 ENCOUNTER — Ambulatory Visit (INDEPENDENT_AMBULATORY_CARE_PROVIDER_SITE_OTHER): Payer: Medicare Other

## 2022-02-03 ENCOUNTER — Telehealth: Payer: Self-pay

## 2022-02-03 DIAGNOSIS — I4729 Other ventricular tachycardia: Secondary | ICD-10-CM

## 2022-02-03 LAB — CUP PACEART REMOTE DEVICE CHECK
Battery Remaining Longevity: 1 mo — CL
Battery Voltage: 2.67 V
Brady Statistic RA Percent Paced: 0.88 %
Brady Statistic RV Percent Paced: 85.34 %
Date Time Interrogation Session: 20240105073248
HighPow Impedance: 39 Ohm
HighPow Impedance: 49 Ohm
Implantable Lead Connection Status: 753985
Implantable Lead Connection Status: 753985
Implantable Lead Implant Date: 20040309
Implantable Lead Implant Date: 20040309
Implantable Lead Location: 753859
Implantable Lead Location: 753860
Implantable Lead Model: 158
Implantable Lead Model: 5076
Implantable Lead Serial Number: 115061
Implantable Pulse Generator Implant Date: 20161007
Lead Channel Impedance Value: 361 Ohm
Lead Channel Impedance Value: 361 Ohm
Lead Channel Impedance Value: 4047 Ohm
Lead Channel Impedance Value: 4047 Ohm
Lead Channel Impedance Value: 4047 Ohm
Lead Channel Impedance Value: 4047 Ohm
Lead Channel Impedance Value: 4047 Ohm
Lead Channel Impedance Value: 4047 Ohm
Lead Channel Impedance Value: 4047 Ohm
Lead Channel Impedance Value: 4047 Ohm
Lead Channel Impedance Value: 4047 Ohm
Lead Channel Impedance Value: 4047 Ohm
Lead Channel Impedance Value: 418 Ohm
Lead Channel Pacing Threshold Amplitude: 0.625 V
Lead Channel Pacing Threshold Amplitude: 0.875 V
Lead Channel Pacing Threshold Pulse Width: 0.4 ms
Lead Channel Pacing Threshold Pulse Width: 0.4 ms
Lead Channel Sensing Intrinsic Amplitude: 1 mV
Lead Channel Sensing Intrinsic Amplitude: 1 mV
Lead Channel Sensing Intrinsic Amplitude: 5.375 mV
Lead Channel Sensing Intrinsic Amplitude: 5.375 mV
Lead Channel Setting Pacing Amplitude: 2 V
Lead Channel Setting Pacing Amplitude: 2.5 V
Lead Channel Setting Pacing Pulse Width: 0.4 ms
Lead Channel Setting Sensing Sensitivity: 0.3 mV

## 2022-02-03 NOTE — Telephone Encounter (Signed)
Spoke with pt who reports he is feeling much better today.  SOB has improved and he had "a good night's sleep'"  Pt will continue current medications and plan for scheduled DCCV.  Pt advised to contact office if S/Sx increase.  Pt verbalizes understanding and thanked Therapist, sports for the call.

## 2022-02-07 ENCOUNTER — Encounter: Payer: Self-pay | Admitting: Nurse Practitioner

## 2022-02-07 ENCOUNTER — Ambulatory Visit: Payer: Medicare Other | Attending: Physician Assistant | Admitting: Nurse Practitioner

## 2022-02-07 ENCOUNTER — Ambulatory Visit: Payer: Medicare Other

## 2022-02-07 VITALS — BP 102/68 | HR 82 | Ht 73.0 in | Wt 209.4 lb

## 2022-02-07 DIAGNOSIS — I2510 Atherosclerotic heart disease of native coronary artery without angina pectoris: Secondary | ICD-10-CM | POA: Diagnosis not present

## 2022-02-07 DIAGNOSIS — I493 Ventricular premature depolarization: Secondary | ICD-10-CM

## 2022-02-07 DIAGNOSIS — Z79899 Other long term (current) drug therapy: Secondary | ICD-10-CM

## 2022-02-07 DIAGNOSIS — Z01812 Encounter for preprocedural laboratory examination: Secondary | ICD-10-CM

## 2022-02-07 DIAGNOSIS — I5022 Chronic systolic (congestive) heart failure: Secondary | ICD-10-CM | POA: Insufficient documentation

## 2022-02-07 DIAGNOSIS — I480 Paroxysmal atrial fibrillation: Secondary | ICD-10-CM

## 2022-02-07 DIAGNOSIS — I4729 Other ventricular tachycardia: Secondary | ICD-10-CM | POA: Insufficient documentation

## 2022-02-07 DIAGNOSIS — Z95810 Presence of automatic (implantable) cardiac defibrillator: Secondary | ICD-10-CM | POA: Diagnosis not present

## 2022-02-07 DIAGNOSIS — I4819 Other persistent atrial fibrillation: Secondary | ICD-10-CM | POA: Diagnosis not present

## 2022-02-07 NOTE — Progress Notes (Signed)
Office Visit    Patient Name: [name] Date of Encounter: 02/07/2022  Primary Care Provider:  Sueanne Margarita, DO Primary Cardiologist:  Sherren Mocha, MD Primary Electrophysiologist: [name], MD  Chief Complaint    TRYSTON GILLIAM is a 89 y.o. male with PMH of CAD s/p CABG 2001, MV repair, polymorphic VT s/p ICD implant HTN, DM type II, persistent AF (on Eliquis), HFrEF, pulmonary HTN, carotid artery disease s/p right CEA, AAA (5.7 cm 12/2018 who presents today for discussion of upcoming DCCV.  Past Medical History    Past Medical History:  Diagnosis Date   Acute on chronic combined systolic (congestive) and diastolic (congestive) heart failure (HCC)    AICD (automatic cardioverter/defibrillator) present    Medtronicn PPM/ICD   Carotid stenosis, right    s/p endarterectomy 11/07/18   Chronic combined systolic (congestive) and diastolic (congestive) heart failure (HCC)    Coronary artery disease    [facility]   Diabetes mellitus    Dysrhythmia    Endocarditis, valve unspecified    Mitral   Headache    hx migraines 6-7 x mo   History of migraine headaches    History of seasonal allergies    ICD (implantable cardiac defibrillator) in place    Optic nerve hemorrhage, 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  We will monitor this closely and look for changes over the ensuing 6 months.  Repeat visit here in 6 months   PAF (paroxysmal atrial fibrillation) (HCC)    NO LAA occlusion at the time of surgery  [name] 2018   Pleural effusion    PVC's (premature ventricular contractions)    Ventricular tachycardia, polymorphic ([place])    status post ICD implantation   Past Surgical History:  Procedure Laterality Date   BRONCHIAL BIOPSY  07/22/2020   Procedure: BRONCHIAL BIOPSIES;  Surgeon: Collene Gobble, MD;  Location: [facility];  Service: Cardiopulmonary;;    BRONCHIAL BRUSHINGS  07/22/2020   Procedure: BRONCHIAL BRUSHINGS;  Surgeon: Collene Gobble, MD;  Location: [facility];  Service: Cardiopulmonary;;   BRONCHIAL WASHINGS  07/22/2020   Procedure: BRONCHIAL WASHINGS;  Surgeon: Collene Gobble, MD;  Location: [facility];  Service: Cardiopulmonary;;   CARDIAC CATHETERIZATION  04/03/2007   EF 40%   CARDIAC CATHETERIZATION  02/06/2006   EF 45%. ANTERIOR HYPOKINESIS   CARDIAC CATHETERIZATION  05/29/2000   EF 50%. MILD ANTERIOR HYPOKINESIS   CARDIAC CATHETERIZATION  10/25/1999   EF 50%. SEVERE MITRAL REGURGITATION   CARDIAC CATHETERIZATION  01/06/2003   EF 50%   CARDIAC DEFIBRILLATOR PLACEMENT     CARDIOVERSION N/A 02/07/2019   Procedure: CARDIOVERSION;  Surgeon: Fay Records, MD;  Location: [facility] ENDOSCOPY;  Service: Cardiovascular;  Laterality: N/A;   CARDIOVERSION N/A 11/24/2019   Procedure: CARDIOVERSION;  Surgeon: Josue Hector, MD;  Location: [facility] ENDOSCOPY;  Service: Cardiovascular;  Laterality: N/A;   CATARACT EXTRACTION W/ INTRAOCULAR LENS  IMPLANT, BILATERAL     CHOLECYSTECTOMY N/A 01/08/2019   Procedure: LAPAROSCOPIC CHOLECYSTECTOMY WITH INTRAOPERATIVE CHOLANGIOGRAM;  Surgeon: Donnie Mesa, MD;  Location: [place];  Service: General;  Laterality: N/A;   CORONARY ARTERY BYPASS GRAFT  2001   2 VESSEL CABG AND MITRAL VALVE REPAIR. HE HAD A LIMA GRAFT TO THE LAD AND RADIAL ARTERY  GRAFT TO THE OBTUSE MARGINAL  OF LEFT CIRCUMFLEX   ENDARTERECTOMY Right 11/07/2018   Procedure: ENDARTERECTOMY  CAROTID RIGHT;  Surgeon: Waynetta Sandy, MD;  Location: [place];  Service: Vascular;  Laterality: Right;   EP IMPLANTABLE DEVICE N/A 11/06/2014   Procedure: ICD Generator Changeout;  Surgeon: Deboraha Sprang, MD;  Location: [place] [facility];  Service: Cardiovascular;  Laterality: N/A;   ERCP N/A 01/10/2019   Procedure: ENDOSCOPIC RETROGRADE CHOLANGIOPANCREATOGRAPHY (ERCP);  Surgeon: Clarene Essex, MD;  Location: [place];  Service: Endoscopy;   Laterality: N/A;   FOREIGN BODY REMOVAL Right 06/21/2017   Procedure: FOREIGN BODY REMOVAL ADULT RIGHT RING FINGER;  Surgeon: Leanora Cover, MD;  Location: [place];  Service: Orthopedics;  Laterality: Right;   HEMORRHOID SURGERY     HEMORROIDECTOMY     INTRAVASCULAR PRESSURE WIRE/FFR STUDY N/A 01/05/2017   Procedure: INTRAVASCULAR PRESSURE WIRE/FFR STUDY;  Surgeon: Sherren Mocha, MD;  Location: [place] [facility];  Service: Cardiovascular;  Laterality: N/A;   KNEE ARTHROSCOPY     RIGHT KNEE   LAPAROSCOPIC APPENDECTOMY N/A 01/08/2019   Procedure: APPENDECTOMY LAPAROSCOPIC;  Surgeon: Donnie Mesa, MD;  Location: [place];  Service: General;  Laterality: N/A;   LEFT HEART CATHETERIZATION WITH CORONARY ANGIOGRAM N/A 07/04/2012   Procedure: LEFT HEART CATHETERIZATION WITH CORONARY ANGIOGRAM;  Surgeon: Sherren Mocha, MD;  Location: [facility] CATH LAB;  Service: Cardiovascular;  Laterality: N/A;   MITRAL VALVE REPLACEMENT     No LAA occlusion at the time of surgery  Karie Soda report 2018   REMOVAL OF STONES  01/10/2019   Procedure: REMOVAL OF STONES;  Surgeon: Clarene Essex, MD;  Location: [place];  Service: Endoscopy;;   RIGHT/LEFT HEART CATH AND CORONARY/GRAFT ANGIOGRAPHY N/A 01/05/2017   Procedure: RIGHT/LEFT HEART CATH AND CORONARY/GRAFT ANGIOGRAPHY;  Surgeon: Sherren Mocha, MD;  Location: [place] CV [facility];  Service: Cardiovascular;  Laterality: N/A;   SPHINCTEROTOMY  01/10/2019   Procedure: SPHINCTEROTOMY;  Surgeon: Clarene Essex, MD;  Location: [place];  Service: Endoscopy;;   TRANSTHORACIC ECHOCARDIOGRAM  04/02/2007   EF 35%   VIDEO BRONCHOSCOPY Right 07/22/2020   Procedure: VIDEO BRONCHOSCOPY WITH FLUORO, BAL AND BIOPSY;  Surgeon: Collene Gobble, MD;  Location: [facility];  Service: Cardiopulmonary;  Laterality: Right;  CONSCIOUS SEDATION OR MAC    Allergies  Allergies  Allergen Reactions   Bee Venom Anaphylaxis   Latex Other (See Comments)    REDNESS AT REACTION SITE.   Metformin  Diarrhea   Rivaroxaban Other (See Comments)    Headaches, blurred vision   Sotalol Other (See Comments)    "BLUE TOES"- cut his circulation off   Levofloxacin     Other reaction(s): long QT syndrome, so avoid   Amiodarone Rash    Other reaction(s): rash, doesn't sound like DRESS syndrome.   Warfarin Sodium Other (See Comments)    REACTION: migraine headaches/vision impariment    History of Present Illness    [name]  is a 89 year old male with the above mention past medical history who presents today for discussion of upcoming DCCV on 02/15/2022.  Mr.Albornoz was last seen by Dr. Burt Knack on 11/07/2021 for follow-up.  During visit patient endorsed occasional postural dizziness associated with low blood pressure.  He denies any frank syncope and was instructed to monitor blood pressures.  Most recent 2D echo completed 04/2020 with an EF of 35-40%, mid apical septal global hypokinesis, moderately decreased LV function, mildly reduced RV function, severely dilated RA/LA, mild to moderate TV regurgitation with dilated aortic root of 40 mm.  He was in persistent atrial fibrillation and dofetilide  was discontinued.  He was stable with NYHA functional class II symptoms BP was low with no additional titration of GDMT at that time.  He was last seen by Dr. Caryl Comes on 12/28/2021 discussion of device generator replacement.  He was started on amiodarone 40 mg twice daily x 2 weeks then transition to 40 mg daily with plan for DCCV in 3 weeks on 01/30/2022.  He contacted the office 02/02/2022 with complaint of lower extremity edema and shortness of breath.  He he was currently taking Lasix 40 mg daily with good urinary response.  He contacted our office today with complaint of shortness of breath on exertion and continued edema.  He is scheduled to undergo DCCV on 02/15/2022.  Mr. Gulbranson presents today for repeat DCCV visit with his daughter.  Since last being seen in the office patient reports that he has experienced  increased shortness of breath but no chest pain or dizziness.  His blood pressure today is well-controlled at 102/68 and heartbeat was 82 bpm.  EKG was completed showing controlled AF and patient reports compliance with his current medication regimen.  There was visit we reviewed the risk and benefits of DCCV and all questions were answered per patient's understanding.  On examination the patient's feet are mottled purpleish and blue bilaterally.  Patient reports that he is not having any pain or discomfort from his feet and states that he would like guidance from Dr. Caryl Comes regarding next steps in management of antiarrhythmic medication.  Patient denies chest pain, palpitations, dyspnea, PND, orthopnea, nausea, vomiting, dizziness, syncope, edema, weight gain, or early satiety.  Home Medications    Current Outpatient Medications  Medication Sig Dispense Refill   albuterol (VENTOLIN HFA) 108 (90 Base) MCG/ACT inhaler Inhale 2 puffs into the lungs every 6 (six) hours as needed for wheezing or shortness of breath. 8 g 5   amiodarone (PACERONE) 200 MG tablet Take 2 tablets (435m) twice daily x 2 weeks then 2 tablets (4075m daily. 84 tablet 0   atorvastatin (LIPITOR) 80 MG tablet TAKE ONE TABLET BY MOUTH EVERY DAY FOR CHOLESTEROL 90 tablet 3   cetirizine (ZYRTEC) 10 MG tablet Take 10 mg by mouth in the morning.     ELIQUIS 5 MG TABS tablet TAKE ONE TABLET BY MOUTH TWICE DAILY 180 tablet 1   empagliflozin (JARDIANCE) 10 MG TABS tablet Take 10 mg by mouth in the morning.     EPIPEN 2-PAK 0.3 MG/0.3ML SOAJ injection Inject 0.3 mg into the muscle daily as needed for anaphylaxis.      Erenumab-aooe (AIMOVIG) 140 MG/ML SOAJ Inject 140 mg into the skin every 28 (twenty-eight) days. 1.12 mL 11   fluticasone (FLONASE) 50 MCG/ACT nasal spray Place 1 spray into the nose daily as needed for rhinitis or allergies.     furosemide (LASIX) 40 MG tablet Take 1 tablet (40 mg total) by mouth daily. 30 tablet 0   ibuprofen  (ADVIL) 200 MG tablet Take 400 mg by mouth every 8 (eight) hours as needed (headaches/migraine).     Magnesium 250 MG TABS Take 250 mg by mouth in the morning.     metoprolol succinate (TOPROL-XL) 25 MG 24 hr tablet TAKE ONE TABLET BY MOUTH TWICE DAILY WITH OR IMMEDIATELY FOLLOWING A MEAL 180 tablet 3   Multiple Vitamin (MULTIVITAMIN WITH MINERALS) TABS tablet Take 1 tablet by mouth at bedtime. Centrum Silver     Multiple Vitamins-Minerals (PRESERVISION AREDS 2) CAPS as directed Orally     nateglinide (STARLIX) 60  MG tablet Take 1 tablet twice a day before meals for diabetes     ONETOUCH VERIO test strip SMARTSIG:Via Meter 1-2 Times Daily     sitaGLIPtin (JANUVIA) 100 MG tablet Take 100 mg by mouth at bedtime.     Tiotropium Bromide-Olodaterol (STIOLTO RESPIMAT) 2.5-2.5 MCG/ACT AERS Inhale 2 puffs into the lungs at bedtime.     No current facility-administered medications for this visit.     Review of Systems  Please see the history of present illness.    (+) Shortness of breath with exertion (+) Mottled cool feet  All other systems reviewed and are otherwise negative except as noted above.  Physical Exam    Wt Readings from Last 3 Encounters:  12/28/21 202 lb (91.6 kg)  11/16/21 194 lb (88 kg)  11/07/21 197 lb 9.6 oz (89.6 kg)   ZO:XWRUE were no vitals filed for this visit.,There is no height or weight on file to calculate BMI.  Constitutional:      Appearance: Healthy appearance. Not in distress.  Neck:     Vascular: JVD normal.  Pulmonary:     Effort: Pulmonary effort is normal.     Breath sounds: No wheezing. No rales. Diminished in the bases Cardiovascular:  Irregular irregular rhythm normal S1. Normal S2.      Murmurs: There is no murmur.  Edema:    Peripheral edema absent.  Abdominal:     Palpations: Abdomen is soft non tender. There is no hepatomegaly.  Skin:    General: Skin is warm and dry.  -Patient has mottled purpleish to blue feet bilateral that are cool to  the touch.  Pulses are equal bilaterally with no pain or pitting edema noted. Neurological:     General: No focal deficit present.     Mental Status: Alert and oriented to person, place and time.     Cranial Nerves: Cranial nerves are intact.  EKG/LABS/Other Studies Reviewed    ECG personally reviewed by me today -atrial fibrillation with rate of 82 bpm and RBBB left axis deviation no acute changes noted consistent with previous EKG.  Risk Assessment/Calculations:    CHA2DS2-VASc Score = 5   This indicates a 7.2% annual risk of stroke. The patient's score is based upon: CHF History: 1 HTN History: 0 Diabetes History: 1 Stroke History: 0 Vascular Disease History: 1 Age Score: 2 Gender Score: 0           Lab Results  Component Value Date   WBC 6.6 07/14/2020   HGB 14.1 07/14/2020   HCT 43.5 07/14/2020   MCV 101.2 (H) 07/14/2020   PLT 174 07/14/2020   Lab Results  Component Value Date   CREATININE 0.93 07/14/2020   BUN 18 07/14/2020   NA 132 (L) 07/14/2020   K 4.8 07/14/2020   CL 97 (L) 07/14/2020   CO2 26 07/14/2020   Lab Results  Component Value Date   ALT 20 07/14/2020   AST 23 07/14/2020   ALKPHOS 111 07/14/2020   BILITOT 1.6 (H) 07/14/2020   Lab Results  Component Value Date   CHOL 110 11/05/2018   HDL 48 11/05/2018   LDLCALC 44 11/05/2018   TRIG 90 11/05/2018   CHOLHDL 2.3 11/05/2018    Lab Results  Component Value Date   HGBA1C 6.8 (H) 11/04/2018    Assessment & Plan    1.  Persistent atrial fibrillation: -Patient currently on rhythm control with amiodarone 400 mg daily -Patient scheduled to undergo DCCV on 02/15/2022 -Patient has  discoloration to his feet bilaterally but denies any tingling or pain.  I consulted the DOD Dr. Gregery Na who recommended that EP consider alternative antiarrhythmic following DCCV.  In a shared decision with the patient he elected to receive guidance from Dr. Caryl Comes regarding management of antiarrhythmic  medication. -Continue Eliquis 5 mg twice daily -CHA2DS2-VASc Score = 5 [CHF History: 1, HTN History: 0, Diabetes History: 1, Stroke History: 0, Vascular Disease History: 1, Age Score: 2, Gender Score: 0].  Therefore, the patient's annual risk of stroke is 7.2 %.      2.  Chronic systolic CHF: -2D echo completed 04/2020 with an EF of 35-40%, mid apical septal global hypokinesis, moderately decreased LV function -Today patient reports euvolemic but endorses increased shortness of breath with activity. -Continue current GDMT with Jardiance 10 mg daily, Lasix 40 mg daily, metoprolol 25 mg daily,  3.  History of coronary artery disease: -s/p CABG in 2001 with most recent ischemic evaluation by [facility] in 2018 with two-vessel CAD and patency of LIMA-LAD and right radial graft to OM -Today patient reports no complaints of chest pain  -Continue GDMT with Lipitor 80 mg, Toprol-XL 25 mg  4.  History of PVCs: -s/p Medtronic ICD pending upgrade to BiV ICD -Most recent [employer] report showing normal device function.     Disposition: Follow-up with Sherren Mocha, MD or APP in as scheduled months Shared Decision Making/Informed Consent The risks (stroke, cardiac arrhythmias rarely resulting in the need for a temporary or permanent pacemaker, skin irritation or burns and complications associated with conscious sedation including aspiration, arrhythmia, respiratory failure and death), benefits (restoration of normal sinus rhythm) and alternatives of a direct current cardioversion were explained in detail to Mr. Roma and he agrees to proceed.     Medication Adjustments/Labs and Tests Ordered: Current medicines are reviewed at length with the patient today.  Concerns regarding medicines are outlined above.   Signed, Mable Fill, Marissa Nestle, NP 02/07/2022, 1:08 PM [facility]  Note:  This document was prepared using Dragon voice recognition software and may include unintentional  dictation errors.

## 2022-02-07 NOTE — Patient Instructions (Addendum)
Medication Instructions:  Your physician recommends that you continue on your current medications as directed. Please refer to the Current Medication list given to you today. *If you need a refill on your cardiac medications before your next appointment, please call your pharmacy*   Lab Work: TODAY-CMET, CBC, TSH If you have labs (blood work) drawn today and your tests are completely normal, you will receive your results only by: [facility] (if you have MyChart) OR A paper copy in the mail If you have any lab test that is abnormal or we need to change your treatment, we will call you to review the results.   Testing/Procedures: Your physician has recommended that you have a Cardioversion (DCCV). Electrical Cardioversion uses a jolt of electricity to your heart either through paddles or wired patches attached to your chest. This is a controlled, usually prescheduled, procedure. Defibrillation is done under light anesthesia in the hospital, and you usually go home the day of the procedure. This is done to get your heart back into a normal rhythm. You are not awake for the procedure. Please see the instruction sheet given to you today.   Follow-Up: At [facility], you and your health needs are our priority.  As part of our continuing mission to provide you with exceptional heart care, we have created designated Provider Care Teams.  These Care Teams include your primary Cardiologist (physician) and Advanced Practice Providers (APPs -  Physician Assistants and Nurse Practitioners) who all work together to provide you with the care you need, when you need it.  We recommend signing up for the patient portal called "MyChart".  Sign up information is provided on this After Visit Summary.  MyChart is used to connect with patients for Virtual Visits (Telemedicine).  Patients are able to view lab/test results, encounter notes, upcoming appointments, etc.  Non-urgent messages can be sent to  your provider as well.   To learn more about what you can do with MyChart, go to [email or web address].    Your next appointment:   FOLLOW UP AS SCHEDULED   The format for your next appointment:   In Person  Provider:   Tommye Standard, PA-C  Other Instructions    Dear [name]   You are scheduled for a Cardioversion on Wednesday, January 17 with Dr. [name].  Please arrive at the [facility] (Main Entrance A) at [facility]: [address] [place], [place] [address] at 7:30 AM.    DIET:  Nothing to eat or drink after midnight except a sip of water with medications (see medication instructions below)  MEDICATION INSTRUCTIONS: Continue taking your anticoagulant (blood thinner): Apixaban (Eliquis).  You will need to continue this after your procedure until you are told by your provider that it is safe to stop.    LABS:   Come to the lab at [facility] at [address] between the hours of 8:00 am and 4:30 pm. You do NOT have to be fasting.  You must have a responsible person to drive you home and stay in the waiting area during your procedure. Failure to do so could result in cancellation.  Bring your insurance cards.  *Special Note: Every effort is made to have your procedure done on time. Occasionally there are emergencies that occur at the hospital that may cause delays. Please be patient if a delay does occur.      Important Information About Sugar

## 2022-02-07 NOTE — H&P (View-Only) (Signed)
Office Visit    Patient Name: [name] Date of Encounter: 02/07/2022  Primary Care Provider:  Sueanne Margarita, DO Primary Cardiologist:  Sherren Mocha, MD Primary Electrophysiologist: [name], MD  Chief Complaint    [name] is a 89 y.o. male with PMH of CAD s/p CABG 2001, MV repair, polymorphic VT s/p ICD implant HTN, DM type II, persistent AF (on Eliquis), HFrEF, pulmonary HTN, carotid artery disease s/p right CEA, AAA (5.7 cm 12/2018 who presents today for discussion of upcoming DCCV.  Past Medical History    Past Medical History:  Diagnosis Date   Acute on chronic combined systolic (congestive) and diastolic (congestive) heart failure (HCC)    AICD (automatic cardioverter/defibrillator) present    Medtronicn PPM/ICD   Carotid stenosis, right    s/p endarterectomy 11/07/18   Chronic combined systolic (congestive) and diastolic (congestive) heart failure (HCC)    Coronary artery disease    [facility]   Diabetes mellitus    Dysrhythmia    Endocarditis, valve unspecified    Mitral   Headache    hx migraines 6-7 x mo   History of migraine headaches    History of seasonal allergies    ICD (implantable cardiac defibrillator) in place    Optic nerve hemorrhage, 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  We will monitor this closely and look for changes over the ensuing 6 months.  Repeat visit here in 6 months   PAF (paroxysmal atrial fibrillation) (HCC)    NO LAA occlusion at the time of surgery  [name] 2018   Pleural effusion    PVC's (premature ventricular contractions)    Ventricular tachycardia, polymorphic ([place])    status post ICD implantation   Past Surgical History:  Procedure Laterality Date   BRONCHIAL BIOPSY  07/22/2020   Procedure: BRONCHIAL BIOPSIES;  Surgeon: Collene Gobble, MD;  Location: [facility];  Service: Cardiopulmonary;;    BRONCHIAL BRUSHINGS  07/22/2020   Procedure: BRONCHIAL BRUSHINGS;  Surgeon: Collene Gobble, MD;  Location: [facility];  Service: Cardiopulmonary;;   BRONCHIAL WASHINGS  07/22/2020   Procedure: BRONCHIAL WASHINGS;  Surgeon: Collene Gobble, MD;  Location: [facility];  Service: Cardiopulmonary;;   CARDIAC CATHETERIZATION  04/03/2007   EF 40%   CARDIAC CATHETERIZATION  02/06/2006   EF 45%. ANTERIOR HYPOKINESIS   CARDIAC CATHETERIZATION  05/29/2000   EF 50%. MILD ANTERIOR HYPOKINESIS   CARDIAC CATHETERIZATION  10/25/1999   EF 50%. SEVERE MITRAL REGURGITATION   CARDIAC CATHETERIZATION  01/06/2003   EF 50%   CARDIAC DEFIBRILLATOR PLACEMENT     CARDIOVERSION N/A 02/07/2019   Procedure: CARDIOVERSION;  Surgeon: Fay Records, MD;  Location: [facility] ENDOSCOPY;  Service: Cardiovascular;  Laterality: N/A;   CARDIOVERSION N/A 11/24/2019   Procedure: CARDIOVERSION;  Surgeon: Josue Hector, MD;  Location: [facility] ENDOSCOPY;  Service: Cardiovascular;  Laterality: N/A;   CATARACT EXTRACTION W/ INTRAOCULAR LENS  IMPLANT, BILATERAL     CHOLECYSTECTOMY N/A 01/08/2019   Procedure: LAPAROSCOPIC CHOLECYSTECTOMY WITH INTRAOPERATIVE CHOLANGIOGRAM;  Surgeon: Donnie Mesa, MD;  Location: [place];  Service: General;  Laterality: N/A;   CORONARY ARTERY BYPASS GRAFT  2001   2 VESSEL CABG AND MITRAL VALVE REPAIR. HE HAD A LIMA GRAFT TO THE LAD AND RADIAL ARTERY  GRAFT TO THE OBTUSE MARGINAL  OF LEFT CIRCUMFLEX   ENDARTERECTOMY Right 11/07/2018   Procedure: ENDARTERECTOMY  CAROTID RIGHT;  Surgeon: Waynetta Sandy, MD;  Location: [place];  Service: Vascular;  Laterality: Right;   EP IMPLANTABLE DEVICE N/A 11/06/2014   Procedure: ICD Generator Changeout;  Surgeon: Deboraha Sprang, MD;  Location: [place] CV LAB;  Service: Cardiovascular;  Laterality: N/A;   ERCP N/A 01/10/2019   Procedure: ENDOSCOPIC RETROGRADE CHOLANGIOPANCREATOGRAPHY (ERCP);  Surgeon: Clarene Essex, MD;  Location: [place];  Service: Endoscopy;   Laterality: N/A;   FOREIGN BODY REMOVAL Right 06/21/2017   Procedure: FOREIGN BODY REMOVAL ADULT RIGHT RING FINGER;  Surgeon: Leanora Cover, MD;  Location: [place];  Service: Orthopedics;  Laterality: Right;   HEMORRHOID SURGERY     HEMORROIDECTOMY     INTRAVASCULAR PRESSURE WIRE/FFR STUDY N/A 01/05/2017   Procedure: INTRAVASCULAR PRESSURE WIRE/FFR STUDY;  Surgeon: Sherren Mocha, MD;  Location: [place] [facility];  Service: Cardiovascular;  Laterality: N/A;   KNEE ARTHROSCOPY     RIGHT KNEE   LAPAROSCOPIC APPENDECTOMY N/A 01/08/2019   Procedure: APPENDECTOMY LAPAROSCOPIC;  Surgeon: Donnie Mesa, MD;  Location: [place];  Service: General;  Laterality: N/A;   LEFT HEART CATHETERIZATION WITH CORONARY ANGIOGRAM N/A 07/04/2012   Procedure: LEFT HEART CATHETERIZATION WITH CORONARY ANGIOGRAM;  Surgeon: Sherren Mocha, MD;  Location: [facility] CATH LAB;  Service: Cardiovascular;  Laterality: N/A;   MITRAL VALVE REPLACEMENT     No LAA occlusion at the time of surgery  Karie Soda report 2018   REMOVAL OF STONES  01/10/2019   Procedure: REMOVAL OF STONES;  Surgeon: Clarene Essex, MD;  Location: [place];  Service: Endoscopy;;   RIGHT/LEFT HEART CATH AND CORONARY/GRAFT ANGIOGRAPHY N/A 01/05/2017   Procedure: RIGHT/LEFT HEART CATH AND CORONARY/GRAFT ANGIOGRAPHY;  Surgeon: Sherren Mocha, MD;  Location: [place] CV LAB;  Service: Cardiovascular;  Laterality: N/A;   SPHINCTEROTOMY  01/10/2019   Procedure: SPHINCTEROTOMY;  Surgeon: Clarene Essex, MD;  Location: [place];  Service: Endoscopy;;   TRANSTHORACIC ECHOCARDIOGRAM  04/02/2007   EF 35%   VIDEO BRONCHOSCOPY Right 07/22/2020   Procedure: VIDEO BRONCHOSCOPY WITH FLUORO, BAL AND BIOPSY;  Surgeon: Collene Gobble, MD;  Location: [facility] ENDOS[facility];  Service: Cardiopulmonary;  Laterality: Right;  CONSCIOUS SEDATION OR MAC    Allergies  Allergies  Allergen Reactions   Bee Venom Anaphylaxis   Latex Other (See Comments)    REDNESS AT REACTION SITE.   Metformin  Diarrhea   Rivaroxaban Other (See Comments)    Headaches, blurred vision   Sotalol Other (See Comments)    "BLUE TOES"- cut his circulation off   Levofloxacin     Other reaction(s): long QT syndrome, so avoid   Amiodarone Rash    Other reaction(s): rash, doesn't sound like DRESS syndrome.   Warfarin Sodium Other (See Comments)    REACTION: migraine headaches/vision impariment    History of Present Illness    [name]  is a 89 year old male with the above mention past medical history who presents today for discussion of upcoming DCCV on 02/15/2022.  TonyTucker was last seen by Dr. Burt Knack on 11/07/2021 for follow-up.  During visit patient endorsed occasional postural dizziness associated with low blood pressure.  He denies any frank syncope and was instructed to monitor blood pressures.  Most recent 2D echo completed 04/2020 with an EF of 35-40%, mid apical septal global hypokinesis, moderately decreased LV function, mildly reduced RV function, severely dilated RA/LA, mild to moderate TV regurgitation with dilated aortic root of 40 mm.  He was in persistent atrial fibrillation and dofetilide  was discontinued.  He was stable with NYHA functional class II symptoms BP was low with no additional titration of GDMT at that time.  He was last seen by Dr. Caryl Comes on 12/28/2021 discussion of device generator replacement.  He was started on amiodarone 40 mg twice daily x 2 weeks then transition to 40 mg daily with plan for DCCV in 3 weeks on 01/30/2022.  He contacted the office 02/02/2022 with complaint of lower extremity edema and shortness of breath.  He he was currently taking Lasix 40 mg daily with good urinary response.  He contacted our office today with complaint of shortness of breath on exertion and continued edema.  He is scheduled to undergo DCCV on 02/15/2022.  [name] presents today for repeat DCCV visit with his daughter.  Since last being seen in the office patient reports that he has experienced  increased shortness of breath but no chest pain or dizziness.  His blood pressure today is well-controlled at 102/68 and heartbeat was 82 bpm.  EKG was completed showing controlled AF and patient reports compliance with his current medication regimen.  There was visit we reviewed the risk and benefits of DCCV and all questions were answered per patient's understanding.  On examination the patient's feet are mottled purpleish and blue bilaterally.  Patient reports that he is not having any pain or discomfort from his feet and states that he would like guidance from Dr. Caryl Comes regarding next steps in management of antiarrhythmic medication.  Patient denies chest pain, palpitations, dyspnea, PND, orthopnea, nausea, vomiting, dizziness, syncope, edema, weight gain, or early satiety.  Home Medications    Current Outpatient Medications  Medication Sig Dispense Refill   albuterol (VENTOLIN HFA) 108 (90 Base) MCG/ACT inhaler Inhale 2 puffs into the lungs every 6 (six) hours as needed for wheezing or shortness of breath. 8 g 5   amiodarone (PACERONE) 200 MG tablet Take 2 tablets (435m) twice daily x 2 weeks then 2 tablets (4075m daily. 84 tablet 0   atorvastatin (LIPITOR) 80 MG tablet TAKE ONE TABLET BY MOUTH EVERY DAY FOR CHOLESTEROL 90 tablet 3   cetirizine (ZYRTEC) 10 MG tablet Take 10 mg by mouth in the morning.     ELIQUIS 5 MG TABS tablet TAKE ONE TABLET BY MOUTH TWICE DAILY 180 tablet 1   empagliflozin (JARDIANCE) 10 MG TABS tablet Take 10 mg by mouth in the morning.     EPIPEN 2-PAK 0.3 MG/0.3ML SOAJ injection Inject 0.3 mg into the muscle daily as needed for anaphylaxis.      Erenumab-aooe (AIMOVIG) 140 MG/ML SOAJ Inject 140 mg into the skin every 28 (twenty-eight) days. 1.12 mL 11   fluticasone (FLONASE) 50 MCG/ACT nasal spray Place 1 spray into the nose daily as needed for rhinitis or allergies.     furosemide (LASIX) 40 MG tablet Take 1 tablet (40 mg total) by mouth daily. 30 tablet 0   ibuprofen  (ADVIL) 200 MG tablet Take 400 mg by mouth every 8 (eight) hours as needed (headaches/migraine).     Magnesium 250 MG TABS Take 250 mg by mouth in the morning.     metoprolol succinate (TOPROL-XL) 25 MG 24 hr tablet TAKE ONE TABLET BY MOUTH TWICE DAILY WITH OR IMMEDIATELY FOLLOWING A MEAL 180 tablet 3   Multiple Vitamin (MULTIVITAMIN WITH MINERALS) TABS tablet Take 1 tablet by mouth at bedtime. Centrum Silver     Multiple Vitamins-Minerals (PRESERVISION AREDS 2) CAPS as directed Orally     nateglinide (STARLIX) 60  MG tablet Take 1 tablet twice a day before meals for diabetes     ONETOUCH VERIO test strip SMARTSIG:Via Meter 1-2 Times Daily     sitaGLIPtin (JANUVIA) 100 MG tablet Take 100 mg by mouth at bedtime.     Tiotropium Bromide-Olodaterol (STIOLTO RESPIMAT) 2.5-2.5 MCG/ACT AERS Inhale 2 puffs into the lungs at bedtime.     No current facility-administered medications for this visit.     Review of Systems  Please see the history of present illness.    (+) Shortness of breath with exertion (+) Mottled cool feet  All other systems reviewed and are otherwise negative except as noted above.  Physical Exam    Wt Readings from Last 3 Encounters:  12/28/21 202 lb (91.6 kg)  11/16/21 194 lb (88 kg)  11/07/21 197 lb 9.6 oz (89.6 kg)   EG:BTDVV were no vitals filed for this visit.,There is no height or weight on file to calculate BMI.  Constitutional:      Appearance: Healthy appearance. Not in distress.  Neck:     Vascular: JVD normal.  Pulmonary:     Effort: Pulmonary effort is normal.     Breath sounds: No wheezing. No rales. Diminished in the bases Cardiovascular:  Irregular irregular rhythm normal S1. Normal S2.      Murmurs: There is no murmur.  Edema:    Peripheral edema absent.  Abdominal:     Palpations: Abdomen is soft non tender. There is no hepatomegaly.  Skin:    General: Skin is warm and dry.  -Patient has mottled purpleish to blue feet bilateral that are cool to  the touch.  Pulses are equal bilaterally with no pain or pitting edema noted. Neurological:     General: No focal deficit present.     Mental Status: Alert and oriented to person, place and time.     Cranial Nerves: Cranial nerves are intact.  EKG/LABS/Other Studies Reviewed    ECG personally reviewed by me today -atrial fibrillation with rate of 82 bpm and RBBB left axis deviation no acute changes noted consistent with previous EKG.  Risk Assessment/Calculations:    CHA2DS2-VASc Score = 5   This indicates a 7.2% annual risk of stroke. The patient's score is based upon: CHF History: 1 HTN History: 0 Diabetes History: 1 Stroke History: 0 Vascular Disease History: 1 Age Score: 2 Gender Score: 0           Lab Results  Component Value Date   WBC 6.6 07/14/2020   HGB 14.1 07/14/2020   HCT 43.5 07/14/2020   MCV 101.2 (H) 07/14/2020   PLT 174 07/14/2020   Lab Results  Component Value Date   CREATININE 0.93 07/14/2020   BUN 18 07/14/2020   NA 132 (L) 07/14/2020   K 4.8 07/14/2020   CL 97 (L) 07/14/2020   CO2 26 07/14/2020   Lab Results  Component Value Date   ALT 20 07/14/2020   AST 23 07/14/2020   ALKPHOS 111 07/14/2020   BILITOT 1.6 (H) 07/14/2020   Lab Results  Component Value Date   CHOL 110 11/05/2018   HDL 48 11/05/2018   LDLCALC 44 11/05/2018   TRIG 90 11/05/2018   CHOLHDL 2.3 11/05/2018    Lab Results  Component Value Date   HGBA1C 6.8 (H) 11/04/2018    Assessment & Plan    1.  Persistent atrial fibrillation: -Patient currently on rhythm control with amiodarone 400 mg daily -Patient scheduled to undergo DCCV on 02/15/2022 -Patient has  discoloration to his feet bilaterally but denies any tingling or pain.  I consulted the DOD Dr. Gregery Na who recommended that EP consider alternative antiarrhythmic following DCCV.  In a shared decision with the patient he elected to receive guidance from Dr. Caryl Comes regarding management of antiarrhythmic  medication. -Continue Eliquis 5 mg twice daily -CHA2DS2-VASc Score = 5 [CHF History: 1, HTN History: 0, Diabetes History: 1, Stroke History: 0, Vascular Disease History: 1, Age Score: 2, Gender Score: 0].  Therefore, the patient's annual risk of stroke is 7.2 %.      2.  Chronic systolic CHF: -2D echo completed 04/2020 with an EF of 35-40%, mid apical septal global hypokinesis, moderately decreased LV function -Today patient reports euvolemic but endorses increased shortness of breath with activity. -Continue current GDMT with Jardiance 10 mg daily, Lasix 40 mg daily, metoprolol 25 mg daily,  3.  History of coronary artery disease: -s/p CABG in 2001 with most recent ischemic evaluation by [facility] in 2018 with two-vessel CAD and patency of LIMA-LAD and right radial graft to OM -Today patient reports no complaints of chest pain  -Continue GDMT with Lipitor 80 mg, Toprol-XL 25 mg  4.  History of PVCs: -s/p Medtronic ICD pending upgrade to BiV ICD -Most recent [employer] report showing normal device function.     Disposition: Follow-up with Sherren Mocha, MD or APP in as scheduled months Shared Decision Making/Informed Consent The risks (stroke, cardiac arrhythmias rarely resulting in the need for a temporary or permanent pacemaker, skin irritation or burns and complications associated with conscious sedation including aspiration, arrhythmia, respiratory failure and death), benefits (restoration of normal sinus rhythm) and alternatives of a direct current cardioversion were explained in detail to [name] and he agrees to proceed.     Medication Adjustments/Labs and Tests Ordered: Current medicines are reviewed at length with the patient today.  Concerns regarding medicines are outlined above.   Signed, Mable Fill, Marissa Nestle, NP 02/07/2022, 1:08 PM [facility]  Note:  This document was prepared using Dragon voice recognition software and may include unintentional  dictation errors.

## 2022-02-07 NOTE — Telephone Encounter (Signed)
Spoke with pt regarding MyChart message and advised he will still be contacted regarding rescheduling BIV upgrade to 03/10/2022.  Pt states he continues to struggle with SOB on exertion and edema.  Discussed needing to schedule appointment to update EKG and H&P with provider for upcoming DCCV.  Due to pt's increasing symptoms will have him come in today to see Ambrose Pancoast, NP at 130pm.  Pt verbalizes understanding and agrees with current plan.

## 2022-02-08 ENCOUNTER — Telehealth: Payer: Self-pay

## 2022-02-08 LAB — CBC
Hematocrit: 45 % (ref 37.5–51.0)
Hemoglobin: 15.5 g/dL (ref 13.0–17.7)
MCH: 34.5 pg — ABNORMAL HIGH (ref 26.6–33.0)
MCHC: 34.4 g/dL (ref 31.5–35.7)
MCV: 100 fL — ABNORMAL HIGH (ref 79–97)
Platelets: 116 10*3/uL — ABNORMAL LOW (ref 150–450)
RBC: 4.49 x10E6/uL (ref 4.14–5.80)
RDW: 12.8 % (ref 11.6–15.4)
WBC: 6.1 10*3/uL (ref 3.4–10.8)

## 2022-02-08 LAB — COMPREHENSIVE METABOLIC PANEL
ALT: 20 IU/L (ref 0–44)
AST: 30 IU/L (ref 0–40)
Albumin/Globulin Ratio: 1.9 (ref 1.2–2.2)
Albumin: 4.5 g/dL (ref 3.7–4.7)
Alkaline Phosphatase: 148 IU/L — ABNORMAL HIGH (ref 44–121)
BUN/Creatinine Ratio: 20 (ref 10–24)
BUN: 26 mg/dL (ref 8–27)
Bilirubin Total: 2.6 mg/dL — ABNORMAL HIGH (ref 0.0–1.2)
CO2: 24 mmol/L (ref 20–29)
Calcium: 9.3 mg/dL (ref 8.6–10.2)
Chloride: 96 mmol/L (ref 96–106)
Creatinine, Ser: 1.29 mg/dL — ABNORMAL HIGH (ref 0.76–1.27)
Globulin, Total: 2.4 g/dL (ref 1.5–4.5)
Glucose: 271 mg/dL — ABNORMAL HIGH (ref 70–99)
Potassium: 5.3 mmol/L — ABNORMAL HIGH (ref 3.5–5.2)
Sodium: 135 mmol/L (ref 134–144)
Total Protein: 6.9 g/dL (ref 6.0–8.5)
eGFR: 53 mL/min/{1.73_m2} — ABNORMAL LOW (ref >59)

## 2022-02-08 LAB — TSH: TSH: 5.17 u[IU]/mL — ABNORMAL HIGH (ref 0.450–4.500)

## 2022-02-08 NOTE — Telephone Encounter (Signed)
Pt is aware that his procedure has been moved to 03/10/22 @ 12:00.  I went over Instructions again, he is aware to hold Eliquis 2 doses.  [place], [place] and Cath Lab have been updated.

## 2022-02-09 ENCOUNTER — Encounter: Payer: Self-pay | Admitting: Internal Medicine

## 2022-02-10 ENCOUNTER — Ambulatory Visit: Payer: Medicare Other

## 2022-02-10 ENCOUNTER — Ambulatory Visit: Payer: Medicare Other | Admitting: Physician Assistant

## 2022-02-10 ENCOUNTER — Other Ambulatory Visit: Payer: Medicare Other

## 2022-02-14 NOTE — Anesthesia Preprocedure Evaluation (Addendum)
Anesthesia Evaluation  Patient identified by MRN, date of birth, ID band Patient awake    Reviewed: Allergy & Precautions, NPO status , Patient's Chart, lab work & pertinent test results, reviewed documented beta blocker date and time   History of Anesthesia Complications Negative for: history of anesthetic complications  Airway Mallampati: II  TM Distance: >3 FB Neck ROM: Full    Dental  (+) Dental Advisory Given   Pulmonary COPD,  COPD inhaler, former smoker   breath sounds clear to auscultation       Cardiovascular hypertension, Pt. on medications and Pt. on home beta blockers (-) angina + CAD, + CABG, + Peripheral Vascular Disease and +CHF  + dysrhythmias Atrial Fibrillation and Ventricular Tachycardia + Cardiac Defibrillator (device last shocked 7 years ago) + Valvular Problems/Murmurs (s/p MVRepair)  Rhythm:Irregular Rate:Normal  '22 ECHo: EF 35-40%, global hypokinesis, mild LVH, mild decrease in RVF, degenerative MV with mild MS, mean gradient 3 mmHg   Neuro/Psych  Headaches TIA negative psych ROS   GI/Hepatic negative GI ROS, Neg liver ROS,,,  Endo/Other  diabetes, Oral Hypoglycemic Agents    Renal/GU Renal InsufficiencyRenal disease     Musculoskeletal   Abdominal   Peds  Hematology eliquis   Anesthesia Other Findings   Reproductive/Obstetrics                             Anesthesia Physical Anesthesia Plan  ASA: 4  Anesthesia Plan: General   Post-op Pain Management: Minimal or no pain anticipated   Induction: Intravenous  PONV Risk Score and Plan: 2 and Treatment may vary due to age or medical condition  Airway Management Planned: Natural Airway and Mask  Additional Equipment: None  Intra-op Plan:   Post-operative Plan:   Informed Consent: I have reviewed the patients History and Physical, chart, labs and discussed the procedure including the risks, benefits and  alternatives for the proposed anesthesia with the patient or authorized representative who has indicated his/her understanding and acceptance.     Dental advisory given  Plan Discussed with: CRNA and Surgeon  Anesthesia Plan Comments:         Anesthesia Quick Evaluation

## 2022-02-15 ENCOUNTER — Other Ambulatory Visit: Payer: Self-pay

## 2022-02-15 ENCOUNTER — Ambulatory Visit (HOSPITAL_BASED_OUTPATIENT_CLINIC_OR_DEPARTMENT_OTHER): Payer: Medicare Other | Admitting: Certified Registered Nurse Anesthetist

## 2022-02-15 ENCOUNTER — Encounter (HOSPITAL_COMMUNITY)
Admission: RE | Disposition: A | Discharge status: Home / Self Care | Payer: Self-pay | Source: Home / Self Care | Attending: Cardiology

## 2022-02-15 ENCOUNTER — Ambulatory Visit (HOSPITAL_COMMUNITY)
Admission: RE | Admit: 2022-02-15 | Discharge: 2022-02-15 | Disposition: A | Discharge status: Home / Self Care | Payer: Medicare Other | Attending: Cardiology | Admitting: Cardiology

## 2022-02-15 ENCOUNTER — Ambulatory Visit (HOSPITAL_COMMUNITY): Payer: Medicare Other | Admitting: Certified Registered Nurse Anesthetist

## 2022-02-15 ENCOUNTER — Encounter (HOSPITAL_COMMUNITY): Payer: Self-pay | Admitting: Cardiology

## 2022-02-15 DIAGNOSIS — I4891 Unspecified atrial fibrillation: Secondary | ICD-10-CM | POA: Diagnosis not present

## 2022-02-15 DIAGNOSIS — I4819 Other persistent atrial fibrillation: Secondary | ICD-10-CM | POA: Insufficient documentation

## 2022-02-15 DIAGNOSIS — Z951 Presence of aortocoronary bypass graft: Secondary | ICD-10-CM | POA: Insufficient documentation

## 2022-02-15 DIAGNOSIS — I4892 Unspecified atrial flutter: Secondary | ICD-10-CM | POA: Insufficient documentation

## 2022-02-15 DIAGNOSIS — Z79899 Other long term (current) drug therapy: Secondary | ICD-10-CM | POA: Diagnosis not present

## 2022-02-15 DIAGNOSIS — J449 Chronic obstructive pulmonary disease, unspecified: Secondary | ICD-10-CM | POA: Diagnosis not present

## 2022-02-15 DIAGNOSIS — Z7984 Long term (current) use of oral hypoglycemic drugs: Secondary | ICD-10-CM | POA: Insufficient documentation

## 2022-02-15 DIAGNOSIS — Z87891 Personal history of nicotine dependence: Secondary | ICD-10-CM

## 2022-02-15 DIAGNOSIS — I272 Pulmonary hypertension, unspecified: Secondary | ICD-10-CM | POA: Diagnosis not present

## 2022-02-15 DIAGNOSIS — Z7901 Long term (current) use of anticoagulants: Secondary | ICD-10-CM | POA: Diagnosis not present

## 2022-02-15 DIAGNOSIS — E1151 Type 2 diabetes mellitus with diabetic peripheral angiopathy without gangrene: Secondary | ICD-10-CM | POA: Insufficient documentation

## 2022-02-15 DIAGNOSIS — Z95810 Presence of automatic (implantable) cardiac defibrillator: Secondary | ICD-10-CM | POA: Diagnosis not present

## 2022-02-15 DIAGNOSIS — I480 Paroxysmal atrial fibrillation: Secondary | ICD-10-CM | POA: Diagnosis not present

## 2022-02-15 DIAGNOSIS — I110 Hypertensive heart disease with heart failure: Secondary | ICD-10-CM | POA: Insufficient documentation

## 2022-02-15 DIAGNOSIS — I2510 Atherosclerotic heart disease of native coronary artery without angina pectoris: Secondary | ICD-10-CM | POA: Insufficient documentation

## 2022-02-15 DIAGNOSIS — I714 Abdominal aortic aneurysm, without rupture, unspecified: Secondary | ICD-10-CM | POA: Diagnosis not present

## 2022-02-15 DIAGNOSIS — I5022 Chronic systolic (congestive) heart failure: Secondary | ICD-10-CM | POA: Diagnosis not present

## 2022-02-15 DIAGNOSIS — I509 Heart failure, unspecified: Secondary | ICD-10-CM

## 2022-02-15 DIAGNOSIS — I779 Disorder of arteries and arterioles, unspecified: Secondary | ICD-10-CM | POA: Diagnosis not present

## 2022-02-15 HISTORY — PX: CARDIOVERSION: SHX1299

## 2022-02-15 LAB — POCT I-STAT, CHEM 8
BUN: 27 mg/dL — ABNORMAL HIGH (ref 8–23)
Calcium, Ion: 0.97 mmol/L — ABNORMAL LOW (ref 1.15–1.40)
Chloride: 102 mmol/L (ref 98–111)
Creatinine, Ser: 1.3 mg/dL — ABNORMAL HIGH (ref 0.61–1.24)
Glucose, Bld: 170 mg/dL — ABNORMAL HIGH (ref 70–99)
HCT: 48 % (ref 39.0–52.0)
Hemoglobin: 16.3 g/dL (ref 13.0–17.0)
Potassium: 5 mmol/L (ref 3.5–5.1)
Sodium: 136 mmol/L (ref 135–145)
TCO2: 25 mmol/L (ref 22–32)

## 2022-02-15 SURGERY — CARDIOVERSION
Anesthesia: General

## 2022-02-15 MED ORDER — SODIUM CHLORIDE 0.9 % IV SOLN: INTRAVENOUS | Status: DC

## 2022-02-15 MED ORDER — ETOMIDATE 2 MG/ML IV SOLN
INTRAVENOUS | Status: DC | PRN
  Administered 2022-02-15: 14 mg via INTRAVENOUS

## 2022-02-15 NOTE — Interval H&P Note (Signed)
History and Physical Interval Note:  02/15/2022 8:55 AM  [name]  has presented today for surgery, with the diagnosis of AFIB.  The various methods of treatment have been discussed with the patient and family. After consideration of risks, benefits and other options for treatment, the patient has consented to  Procedure(s): CARDIOVERSION (N/A) as a surgical intervention.  The patient's history has been reviewed, patient examined, no change in status, stable for surgery.  I have reviewed the patient's chart and labs.  Questions were answered to the patient's satisfaction.     [employer]

## 2022-02-15 NOTE — Transfer of Care (Signed)
Immediate Anesthesia Transfer of Care Note  Patient: [name]  Procedure(s) Performed: CARDIOVERSION  Patient Location: PACU and Endoscopy Unit  Anesthesia Type:General  Level of Consciousness: awake  Airway & Oxygen Therapy: Patient Spontanous Breathing  Post-op Assessment: Report given to RN  Post vital signs: Reviewed  Last Vitals:  Vitals Value Taken Time  BP 109/82   Temp 97.9   Pulse 80   Resp 12   SpO2 96     Last Pain:  Vitals:   02/15/22 0747  TempSrc: Temporal  PainSc: 0-No pain         Complications: No notable events documented.

## 2022-02-15 NOTE — Anesthesia Postprocedure Evaluation (Signed)
Anesthesia Post Note  Patient: [name]  Procedure(s) Performed: CARDIOVERSION     Patient location during evaluation: Endoscopy Anesthesia Type: General Level of consciousness: awake and alert, patient cooperative and oriented Pain management: pain level controlled Vital Signs Assessment: post-procedure vital signs reviewed and stable Respiratory status: spontaneous breathing, nonlabored ventilation and respiratory function stable Cardiovascular status: blood pressure returned to baseline and stable Postop Assessment: no apparent nausea or vomiting and able to ambulate Anesthetic complications: no   No notable events documented.  Last Vitals:  Vitals:   02/15/22 0932 02/15/22 0940  BP: 99/79 107/86  Pulse: 82 85  Resp: (!) 27 (!) 24  Temp: (!) 36.1 C   SpO2: 93% 95%    Last Pain:  Vitals:   02/15/22 0940  TempSrc:   PainSc: 0-No pain                 Scout Guyett,E. Dainel Arcidiacono

## 2022-02-15 NOTE — Discharge Instructions (Signed)

## 2022-02-15 NOTE — CV Procedure (Signed)
    Electrical Cardioversion Procedure Note [name] [id number] 01/07/1932  Procedure: Electrical Cardioversion Indications:  Atrial Flutter  Time Out: Verified patient identification, verified procedure,medications/allergies/relevent history reviewed, required imaging and test results available.  Performed  Procedure Details  The patient was NPO after midnight. Anesthesia was administered at the beside  by Dr. Glennon [name] with propofol.  Cardioversion was performed with synchronized biphasic defibrillation via AP pads with 120 joules.  1 attempt(s) were performed.  The patient converted to normal sinus rhythm. The patient tolerated the procedure well   IMPRESSION:  Successful cardioversion of atrial flutter Interrogated Metronic device from flutter and NSR Device battery low - EP aware   [name] 02/15/2022, 9:24 AM

## 2022-02-16 ENCOUNTER — Telehealth: Payer: Self-pay | Admitting: Cardiovascular Disease

## 2022-02-16 NOTE — Telephone Encounter (Signed)
Spoke with pt who continues to complain of SOB on exertion and edema.  Pt had a successful cardioversion yesterday and thought he felt better but does not.  Pt states he discussed his symptoms with Dr Marlou Porch yesterday who advised him to see Dr Burt Knack as soon as possible.  Pt states he has also called his PCP who directed him to the ED but pt declines to go at this time. Spoke with Dr Burt Knack who recommends pt be worked in on his DOD schedule 02/21/2022.  Pt advised appointment scheduled for 02/21/2022 with Dr Burt Knack.  Continue medications as prescribed. Reviewed ED precautions.  Pt verbalizes understanding and agrees with current plan.

## 2022-02-16 NOTE — Telephone Encounter (Signed)
Pt c/o Shortness Of Breath: STAT if SOB developed within the last 24 hours or pt is noticeably SOB on the phone  1. Are you currently SOB (can you hear that pt is SOB on the phone)? No  2. How long have you been experiencing SOB? 2 weeks  3. Are you SOB when sitting or when up moving around? Moving around  4. Are you currently experiencing any other symptoms? swollen LE, no appetite

## 2022-02-17 NOTE — Telephone Encounter (Signed)
Attempted phone call to pt who did not answer his phone.  Phone call to pt's wife,DPR.  She reports pt did not sleep last night due to SOB.  Current O2 sat is initially 85 which did rise to 94 while on phone call. Pt states he had been up to the bathroom.  Advised pt he need to be seen in the ED for further evaluation.  Recommended they call EMS for transport.  Pt advised O2 cannot be ordered without evaluation.  Pt verbalizes understanding and declines ED visit.  He states if no improvement he will consider ED tomorrow.  Pt and pt's wife thanked Therapist, sports for the phone call.

## 2022-02-18 ENCOUNTER — Encounter (HOSPITAL_COMMUNITY): Payer: Self-pay | Admitting: Cardiology

## 2022-02-20 NOTE — Progress Notes (Signed)
Remote ICD transmission.   

## 2022-02-21 ENCOUNTER — Ambulatory Visit: Payer: Medicare Other | Admitting: Nurse Practitioner

## 2022-02-21 ENCOUNTER — Encounter: Payer: Self-pay | Admitting: Cardiovascular Disease

## 2022-02-21 ENCOUNTER — Ambulatory Visit (HOSPITAL_BASED_OUTPATIENT_CLINIC_OR_DEPARTMENT_OTHER): Payer: Medicare Other | Admitting: Pulmonary Disease

## 2022-02-21 ENCOUNTER — Ambulatory Visit: Payer: Medicare Other | Attending: Nurse Practitioner | Admitting: Cardiovascular Disease

## 2022-02-21 VITALS — BP 110/70 | HR 88 | Ht 73.0 in | Wt 212.8 lb

## 2022-02-21 DIAGNOSIS — I4819 Other persistent atrial fibrillation: Secondary | ICD-10-CM

## 2022-02-21 DIAGNOSIS — I25119 Atherosclerotic heart disease of native coronary artery with unspecified angina pectoris: Secondary | ICD-10-CM

## 2022-02-21 DIAGNOSIS — I5023 Acute on chronic systolic (congestive) heart failure: Secondary | ICD-10-CM | POA: Diagnosis not present

## 2022-02-21 DIAGNOSIS — R600 Localized edema: Secondary | ICD-10-CM | POA: Diagnosis not present

## 2022-02-21 MED ORDER — TORSEMIDE 20 MG PO TABS: ORAL_TABLET | ORAL | 3 refills | Status: DC

## 2022-02-21 NOTE — Progress Notes (Signed)
Cardiology Office Note:    Date:  02/27/2022   ID:  [name], DOB 11-19-1931, MRN [id number]  PCP:  Sueanne Margarita, [place] [facility] Cardiologist:  Sherren Mocha, MD Electrophysiologist:  [name], MD     Referring MD: Sueanne Margarita, DO   Chief Complaint  Patient presents with   Shortness of Breath    History of Present Illness:    ALONTE WULFF is a 89 y.o. male with a hx of: Coronary artery disease  Mitral valve disease S/p CABG, MV repair  Cath 12/18: oLAD 66, pLAD 50, oD1 50; oLCx 54, OM2 95; RCA irregs; R Radial-OM2 and L-LAD patent >> Med rx  Hypertension  Diabetes mellitus  Persistent atrial fibrillation  Dofetilide Rx, Apixaban Rx  (HFrEF) heart failure with reduced ejection fraction  Echocardiogram 10/2018: EF 35-40 Echocardiogram 4/22: EF 35-40  Pulmonary hypertension  Echocardiogram 4/22: RVSP 45.5  Ventricular tachycardia  S/p ICD  Carotid artery disease S/p R CEA  Abdominal aortic aneurysm  CT 12/2018 5.7 cm (followed by vasc surg) -to be managed conservatively  [name] is here today with his wife and his daughter.  He was seen January 9 by Ambrose Pancoast, NP after reaching out to the office with worsening leg swelling and shortness of breath.  At that time he was treated with furosemide 40 mg daily.  He had developed persistent atrial fibrillation and was loaded with amiodarone orally.  He had previously been scheduled for cardioversion January 17 and his medications were not adjusted at that time.  The patient underwent cardioversion as planned with restoration of sinus rhythm.  He is pending a BiV ICD upgrade next month.  He complains of progressive shortness of breath with minimal exertion.  He complains of abdominal fullness and lower extremity edema as well.  His appetite is poor.  He is in a wheelchair today and is not able to do any physical activity whatsoever.  He also complains of orthopnea but no PND.  With exertion he  also complains of upper chest discomfort and lower abdominal discomfort.  He complains of persistent cough and 11 pound weight gain since the beginning of this year.  States that he feels no better after cardioversion.  Past Medical History:  Diagnosis Date   Acute on chronic combined systolic (congestive) and diastolic (congestive) heart failure (HCC)    AICD (automatic cardioverter/defibrillator) present    [employer] PPM/ICD   Carotid stenosis, right    s/p endarterectomy 11/07/18   Chronic combined systolic (congestive) and diastolic (congestive) heart failure (HCC)    Coronary artery disease    [facility]   Diabetes mellitus    Dysrhythmia    Endocarditis, valve unspecified    Mitral   Headache    hx migraines 6-7 x mo   History of migraine headaches    History of seasonal allergies    ICD (implantable cardiac defibrillator) in place    Optic nerve hemorrhage, 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  We will monitor this closely and look for changes over the ensuing 6 months.  Repeat visit here in 6 months   PAF (paroxysmal atrial fibrillation) (HCC)    NO LAA occlusion at the time of surgery  [name] 2018   Pleural effusion    PVC's (premature ventricular contractions)    Ventricular tachycardia, polymorphic (HCC)    status  post ICD implantation    Past Surgical History:  Procedure Laterality Date   BRONCHIAL BIOPSY  07/22/2020   Procedure: BRONCHIAL BIOPSIES;  Surgeon: Collene Gobble, MD;  Location: [facility];  Service: Cardiopulmonary;;   BRONCHIAL BRUSHINGS  07/22/2020   Procedure: BRONCHIAL BRUSHINGS;  Surgeon: Collene Gobble, MD;  Location: [facility];  Service: Cardiopulmonary;;   BRONCHIAL WASHINGS  07/22/2020   Procedure: BRONCHIAL WASHINGS;  Surgeon: Collene Gobble, MD;  Location: [facility];  Service: Cardiopulmonary;;   CARDIAC CATHETERIZATION   04/03/2007   EF 40%   CARDIAC CATHETERIZATION  02/06/2006   EF 45%. ANTERIOR HYPOKINESIS   CARDIAC CATHETERIZATION  05/29/2000   EF 50%. MILD ANTERIOR HYPOKINESIS   CARDIAC CATHETERIZATION  10/25/1999   EF 50%. SEVERE MITRAL REGURGITATION   CARDIAC CATHETERIZATION  01/06/2003   EF 50%   CARDIAC DEFIBRILLATOR PLACEMENT     CARDIOVERSION N/A 02/07/2019   Procedure: CARDIOVERSION;  Surgeon: Fay Records, MD;  Location: [facility] ENDOSCOPY;  Service: Cardiovascular;  Laterality: N/A;   CARDIOVERSION N/A 11/24/2019   Procedure: CARDIOVERSION;  Surgeon: Josue Hector, MD;  Location: [place];  Service: Cardiovascular;  Laterality: N/A;   CARDIOVERSION N/A 02/15/2022   Procedure: CARDIOVERSION;  Surgeon: Jerline Pain, MD;  Location: [place] ENDOSCOPY;  Service: Cardiovascular;  Laterality: N/A;   CATARACT EXTRACTION W/ INTRAOCULAR LENS  IMPLANT, BILATERAL     CHOLECYSTECTOMY N/A 01/08/2019   Procedure: LAPAROSCOPIC CHOLECYSTECTOMY WITH INTRAOPERATIVE CHOLANGIOGRAM;  Surgeon: Donnie Mesa, MD;  Location: [place];  Service: General;  Laterality: N/A;   CORONARY ARTERY BYPASS GRAFT  2001   2 VESSEL CABG AND MITRAL VALVE REPAIR. HE HAD A LIMA GRAFT TO THE LAD AND RADIAL ARTERY  GRAFT TO THE OBTUSE MARGINAL  OF LEFT CIRCUMFLEX   ENDARTERECTOMY Right 11/07/2018   Procedure: ENDARTERECTOMY CAROTID RIGHT;  Surgeon: Waynetta Sandy, MD;  Location: [place];  Service: Vascular;  Laterality: Right;   EP IMPLANTABLE DEVICE N/A 11/06/2014   Procedure: ICD Generator Changeout;  Surgeon: Deboraha Sprang, MD;  Location: [place] CV LAB;  Service: Cardiovascular;  Laterality: N/A;   ERCP N/A 01/10/2019   Procedure: ENDOSCOPIC RETROGRADE CHOLANGIOPANCREATOGRAPHY (ERCP);  Surgeon: Clarene Essex, MD;  Location: [place];  Service: Endoscopy;  Laterality: N/A;   FOREIGN BODY REMOVAL Right 06/21/2017   Procedure: FOREIGN BODY REMOVAL ADULT RIGHT RING FINGER;  Surgeon: Leanora Cover, MD;  Location: [place];  Service:  Orthopedics;  Laterality: Right;   HEMORRHOID SURGERY     HEMORROIDECTOMY     INTRAVASCULAR PRESSURE WIRE/FFR STUDY N/A 01/05/2017   Procedure: INTRAVASCULAR PRESSURE WIRE/FFR STUDY;  Surgeon: Sherren Mocha, MD;  Location: [place] CV LAB;  Service: Cardiovascular;  Laterality: N/A;   KNEE ARTHROSCOPY     RIGHT KNEE   LAPAROSCOPIC APPENDECTOMY N/A 01/08/2019   Procedure: APPENDECTOMY LAPAROSCOPIC;  Surgeon: Donnie Mesa, MD;  Location: [place];  Service: General;  Laterality: N/A;   LEFT HEART CATHETERIZATION WITH CORONARY ANGIOGRAM N/A 07/04/2012   Procedure: LEFT HEART CATHETERIZATION WITH CORONARY ANGIOGRAM;  Surgeon: Sherren Mocha, MD;  Location: [facility] CATH LAB;  Service: Cardiovascular;  Laterality: N/A;   MITRAL VALVE REPLACEMENT     No LAA occlusion at the time of surgery  Karie Soda report 2018   REMOVAL OF STONES  01/10/2019   Procedure: REMOVAL OF STONES;  Surgeon: Clarene Essex, MD;  Location: [place];  Service: Endoscopy;;   RIGHT/LEFT HEART CATH AND CORONARY/GRAFT ANGIOGRAPHY N/A 01/05/2017   Procedure: RIGHT/LEFT HEART CATH AND  CORONARY/GRAFT ANGIOGRAPHY;  Surgeon: Sherren Mocha, MD;  Location: [place] CV LAB;  Service: Cardiovascular;  Laterality: N/A;   SPHINCTEROTOMY  01/10/2019   Procedure: SPHINCTEROTOMY;  Surgeon: Clarene Essex, MD;  Location: [place];  Service: Endoscopy;;   TRANSTHORACIC ECHOCARDIOGRAM  04/02/2007   EF 35%   VIDEO BRONCHOSCOPY Right 07/22/2020   Procedure: VIDEO BRONCHOSCOPY WITH FLUORO, BAL AND BIOPSY;  Surgeon: Collene Gobble, MD;  Location: [facility];  Service: Cardiopulmonary;  Laterality: Right;  CONSCIOUS SEDATION OR MAC    Current Medications: Current Meds  Medication Sig   albuterol (VENTOLIN HFA) 108 (90 Base) MCG/ACT inhaler Inhale 2 puffs into the lungs every 6 (six) hours as needed for wheezing or shortness of breath.   amiodarone (PACERONE) 200 MG tablet Take 2 tablets (447m) twice daily x 2 weeks then 2 tablets (4034m daily.  (Patient taking differently: Take 400 mg by mouth every morning. Take 2 tablets (40060mtwice daily x 2 weeks then 2 tablets (400m65maily.)   atorvastatin (LIPITOR) 80 MG tablet TAKE ONE TABLET BY MOUTH EVERY DAY FOR CHOLESTEROL (Patient taking differently: Take 80 mg by mouth at bedtime.)   ELIQUIS 5 MG TABS tablet TAKE ONE TABLET BY MOUTH TWICE DAILY   empagliflozin (JARDIANCE) 10 MG TABS tablet Take 10 mg by mouth in the morning.   EPIPEN 2-PAK 0.3 MG/0.3ML SOAJ injection Inject 0.3 mg into the muscle daily as needed for anaphylaxis.    Erenumab-aooe (AIMOVIG) 140 MG/ML SOAJ Inject 140 mg into the skin every 28 (twenty-eight) days.   fluticasone (FLONASE) 50 MCG/ACT nasal spray Place 1 spray into the nose daily as needed for rhinitis or allergies.   ibuprofen (ADVIL) 200 MG tablet Take 400 mg by mouth every 8 (eight) hours as needed (headaches/migraine).   Magnesium 250 MG TABS Take 250 mg by mouth in the morning.   metoprolol succinate (TOPROL-XL) 25 MG 24 hr tablet TAKE ONE TABLET BY MOUTH TWICE DAILY WITH OR IMMEDIATELY FOLLOWING A MEAL   Multiple Vitamin (MULTIVITAMIN WITH MINERALS) TABS tablet Take 1 tablet by mouth at bedtime. Centrum Silver   Multiple Vitamins-Minerals (PRESERVISION AREDS 2) CAPS Take 1 capsule by mouth daily.   Multiple Vitamins-Minerals (PRESERVISION AREDS 2) CAPS Take by mouth.   nateglinide (STARLIX) 60 MG tablet Take 60 mg by mouth 2 (two) times daily with a meal.   ONETOUCH VERIO test strip SMARTSIG:Via Meter 1-2 Times Daily   sitaGLIPtin (JANUVIA) 100 MG tablet Take 100 mg by mouth at bedtime.   Tiotropium Bromide-Olodaterol (STIOLTO RESPIMAT) 2.5-2.5 MCG/ACT AERS Inhale 2 puffs into the lungs at bedtime. (Patient taking differently: Inhale 2 puffs into the lungs every morning.)   torsemide (DEMADEX) 20 MG tablet Take 2 tablets (40mg68m mouth twice daily for 3 days, then decrease to 2 tablets (40mg)98me daily thereafter   [DISCONTINUED] furosemide (LASIX) 40 MG  tablet Take 1 tablet (40 mg total) by mouth daily. (Patient taking differently: Take 40 mg by mouth every other day. In the morning)     Allergies:   Bee venom, Latex, Metformin, Rivaroxaban, Sotalol, Levofloxacin, and Warfarin sodium   Social History   Socioeconomic History   Marital status: Married    Spouse name: PatricMardene Celesteber of children: 2   Years of education: Not on file   Highest education level: Associate degree: academic program  Occupational History   Occupation: retired  Tobacco Use   Smoking status: Former    Packs/day: 2.00    Years: 16.00  Total pack years: 32.00    Types: Cigarettes    Start date: 02/23/1956    Quit date: 01/31/1971    Years since quitting: 51.1   Smokeless tobacco: Never  Vaping Use   Vaping Use: Never used  Substance and Sexual Activity   Alcohol use: Yes    Alcohol/week: 0.0 standard drinks of alcohol    Comment: 1-2 a week   Drug use: No   Sexual activity: Yes  Other Topics Concern   Not on file  Social History Narrative   Patient is right-handed. He lives with his wife ina 2 story home. He drinks one cup of coffee and a diet coke a day.    Social Determinants of Health   Financial Resource Strain: Not on file  Food Insecurity: Not on file  Transportation Needs: Not on file  Physical Activity: Not on file  Stress: Not on file  Social Connections: Not on file     Family History: The patient's family history includes Cancer in his sister and another family member; Diabetes in an other family member; Emphysema in his father; Heart disease in his father, paternal grandfather, and paternal grandmother; Stroke in his mother.  ROS:   Please see the history of present illness.    All other systems reviewed and are negative.  EKGs/Labs/Other Studies Reviewed:    The following studies were reviewed today: Echo 05/06/2020: 1. Global hypokinesis worse in the mid-apical septal and apical  myocardium. Left ventricular ejection  fraction, by estimation, is 35 to  40%. The left ventricle has moderately decreased function. The left  ventricle demonstrates global hypokinesis. There  is mild left ventricular hypertrophy of the posterior segment. Left  ventricular diastolic parameters are indeterminate. Elevated left  ventricular end-diastolic pressure.   2. Right ventricular systolic function is mildly reduced. The right  ventricular size is normal. There is moderately elevated pulmonary artery  systolic pressure.   3. Left atrial size was severely dilated.   4. Right atrial size was severely dilated.   5. Mean mitral valve gradient unchanged from 10/2018. The mitral valve is  degenerative. Trivial mitral valve regurgitation. Mild mitral stenosis.  The mean mitral valve gradient is 3.0 mmHg.   6. Tricuspid valve regurgitation is mild to moderate.   7. The aortic valve is normal in structure. There is mild calcification  of the aortic valve. There is mild thickening of the aortic valve. Aortic  valve regurgitation is not visualized. No aortic stenosis is present.   8. Aortic dilatation noted. There is mild dilatation of the aortic root,  measuring 40 mm. There is mild dilatation of the ascending aorta,  measuring 37 mm.   9. The inferior vena cava is normal in size with greater than 50%  respiratory variability, suggesting right atrial pressure of 3 mmHg.    EKG:  EKG is not ordered today.    Recent Labs: 02/07/2022: ALT 20; Platelets 116; TSH 5.170 02/15/2022: BUN 27; Creatinine, Ser 1.30; Hemoglobin 16.3; Potassium 5.0; Sodium 136  Recent Lipid Panel    Component Value Date/Time   CHOL 110 11/05/2018 0256   TRIG 90 11/05/2018 0256   HDL 48 11/05/2018 0256   CHOLHDL 2.3 11/05/2018 0256   VLDL 18 11/05/2018 0256   LDLCALC 44 11/05/2018 0256     Risk Assessment/Calculations:    CHA2DS2-VASc Score = 5   This indicates a 7.2% annual risk of stroke. The patient's score is based upon: CHF History: 1 HTN  History: 0 Diabetes History:  1 Stroke History: 0 Vascular Disease History: 1 Age Score: 2 Gender Score: 0           Physical Exam:    VS:  BP 110/70   Pulse 88   Ht 6' 1"  (1.854 m)   Wt 212 lb 12.8 oz (96.5 kg)   SpO2 96%   BMI 28.08 kg/m     Wt Readings from Last 3 Encounters:  02/21/22 212 lb 12.8 oz (96.5 kg)  02/15/22 209 lb 7 oz (95 kg)  02/07/22 209 lb 6.4 oz (95 kg)     GEN:  elderly man, alert, oriented, NAD, in wheelchair HEENT: Normal NECK: JVP elevated with HJR LYMPHATICS: No lymphadenopathy CARDIAC: RRR, no murmurs, rubs, gallops RESPIRATORY:  Clear to auscultation without rales, wheezing or rhonchi  ABDOMEN: Soft, non-tender, distended MUSCULOSKELETAL:  1+ BL pretibial edema; No deformity  SKIN: Warm and dry, purpleish discoloration of the feet NEUROLOGIC:  Alert and oriented x 3 PSYCHIATRIC:  Normal affect   ASSESSMENT:    1. Acute on chronic systolic heart failure ([place])   2. Bilateral lower extremity edema   3. Coronary artery disease involving native coronary artery of native heart with angina pectoris (HCC)   4. Persistent atrial fibrillation (HCC)    PLAN:    In order of problems listed above:  The patient has progressive symptoms of acute on chronic systolic heart failure with volume overload on exam.  Lengthy discussion with the patient and his family today regarding treatment options.  He would like to avoid hospitalization if at all possible.  I think it is reasonable to try to diurese him as an outpatient with very close clinical follow-up.  He may have progressive LV dysfunction or valvular heart disease.  Will order an updated 2D echocardiogram.  I asked him to discontinue furosemide and start torsemide 40 mg twice daily for 3 days, then 40 mg daily thereafter.  He will return for follow-up with me next week with a metabolic panel and BNP to be drawn at that time.  He understands that if his symptoms worsen he will need to call EMS or go to  the emergency department.  He is oxygenating well today with an oxygen saturation of 96% on room air and does not exhibit signs of acute respiratory distress.  We had discussions regarding goals of care as well.  The patient has a DNR and we discussed the potential for hospice care, pending his response to therapy. Related to his heart failure.  While his feet are discolored, he is not experiencing any pain in the feet are not cold.  No evidence of acute arterial insufficiency. Continue medical management, primary issue is heart failure at this point in time Anticoagulated with apixaban, treated with amiodarone and recent cardioversion noted.  Followed by Dr. Caryl Comes. Will need to decide on Bi-V upgrade - reassess next week at time of follow-up office visit.      Medication Adjustments/Labs and Tests Ordered: Current medicines are reviewed at length with the patient today.  Concerns regarding medicines are outlined above.  Orders Placed This Encounter  Procedures   Basic metabolic panel   Pro b natriuretic peptide (BNP)   ECHOCARDIOGRAM COMPLETE   Meds ordered this encounter  Medications   torsemide (DEMADEX) 20 MG tablet    Sig: Take 2 tablets (60m) by mouth twice daily for 3 days, then decrease to 2 tablets (436m once daily thereafter    Dispense:  180 tablet    Refill:  3    Patient Instructions  Medication Instructions:  START Torsemide TODAY: Take 45m by mouth twice daily for 3 days, then decrease to 471mdaily thereafter *If you need a refill on your cardiac medications before your next appointment, please call your pharmacy*   Lab Work: BMET, BNP on 03/01/22 If you have labs (blood work) drawn today and your tests are completely normal, you will receive your results only by: [place] you have MyChart) OR A paper copy in the mail If you have any lab test that is abnormal or we need to change your treatment, we will call you to review the  results.   Testing/Procedures: ECHO Your physician has requested that you have an echocardiogram. Echocardiography is a painless test that uses sound waves to create images of your heart. It provides your doctor with information about the size and shape of your heart and how well your heart's chambers and valves are working. This procedure takes approximately one hour. There are no restrictions for this procedure. Please do NOT wear cologne, perfume, aftershave, or lotions (deodorant is allowed). Please arrive 15 minutes prior to your appointment time.  Follow-Up: [facility] and your [facility] needs are our priority.  As part of our continuing mission to provide you with exceptional heart care, we have created designated Provider Care Teams.  These Care Teams include your primary Cardiologist (physician) and Advanced Practice Providers (APPs -  Physician Assistants and Nurse Practitioners) who all work together to provide you with the care you need, when you need it.  We recommend signing up for the patient portal called "MyChart".  Sign up information is provided on this After Visit Summary.  MyChart is used to connect with patients for Virtual Visits (Telemedicine).  Patients are able to view lab/test results, encounter notes, upcoming appointments, etc.  Non-urgent messages can be sent to your provider as well.   To learn more about what you can do with MyChart, go to [email or web address]   Your next appointment:   03/01/22 @ 10:20  Provider:   MiSherren MochaMD        Signed, MiSherren MochaMD  02/27/2022 5:42 AM    [place]

## 2022-02-21 NOTE — Patient Instructions (Signed)
Medication Instructions:  START Torsemide TODAY: Take '40mg'$  by mouth twice daily for 3 days, then decrease to '40mg'$  daily thereafter *If you need a refill on your cardiac medications before your next appointment, please call your pharmacy*   Lab Work: BMET, BNP on 03/01/22 If you have labs (blood work) drawn today and your tests are completely normal, you will receive your results only by: [place] (if you have MyChart) OR A paper copy in the mail If you have any lab test that is abnormal or we need to change your treatment, we will call you to review the results.   Testing/Procedures: ECHO Your physician has requested that you have an echocardiogram. Echocardiography is a painless test that uses sound waves to create images of your heart. It provides your doctor with information about the size and shape of your heart and how well your heart's chambers and valves are working. This procedure takes approximately one hour. There are no restrictions for this procedure. Please do NOT wear cologne, perfume, aftershave, or lotions (deodorant is allowed). Please arrive 15 minutes prior to your appointment time.  Follow-Up: At [facility], you and your health needs are our priority.  As part of our continuing mission to provide you with exceptional heart care, we have created designated Provider Care Teams.  These Care Teams include your primary Cardiologist (physician) and Advanced Practice Providers (APPs -  Physician Assistants and Nurse Practitioners) who all work together to provide you with the care you need, when you need it.  We recommend signing up for the patient portal called "MyChart".  Sign up information is provided on this After Visit Summary.  MyChart is used to connect with patients for Virtual Visits (Telemedicine).  Patients are able to view lab/test results, encounter notes, upcoming appointments, etc.  Non-urgent messages can be sent to your provider as well.   To  learn more about what you can do with MyChart, go to [email or web address].    Your next appointment:   03/01/22 @ 10:20  Provider:   Sherren Mocha, MD

## 2022-02-21 NOTE — H&P (View-Only) (Signed)
Cardiology Office Note:    Date:  02/27/2022   ID:  [name], DOB 11-19-1931, MRN [id number]  PCP:  Sueanne Margarita, [place] Prov[facility] Cardiologist:  Sherren Mocha, MD Electrophysiologist:  [name], MD     Referring MD: Sueanne Margarita, DO   Chief Complaint  Patient presents with   Shortness of Breath    History of Present Illness:    [name] is a 89 y.o. male with a hx of: Coronary artery disease  Mitral valve disease S/p CABG, MV repair  Cath 12/18: oLAD 66, pLAD 50, oD1 50; oLCx 54, OM2 95; RCA irregs; R Radial-OM2 and L-LAD patent >> Med rx  Hypertension  Diabetes mellitus  Persistent atrial fibrillation  Dofetilide Rx, Apixaban Rx  (HFrEF) heart failure with reduced ejection fraction  Echocardiogram 10/2018: EF 35-40 Echocardiogram 4/22: EF 35-40  Pulmonary hypertension  Echocardiogram 4/22: RVSP 45.5  Ventricular tachycardia  S/p ICD  Carotid artery disease S/p R CEA  Abdominal aortic aneurysm  CT 12/2018 5.7 cm (followed by vasc surg) -to be managed conservatively  [name] is here today with his wife and his daughter.  He was seen January 9 by Ambrose Pancoast, NP after reaching out to the office with worsening leg swelling and shortness of breath.  At that time he was treated with furosemide 40 mg daily.  He had developed persistent atrial fibrillation and was loaded with amiodarone orally.  He had previously been scheduled for cardioversion January 17 and his medications were not adjusted at that time.  The patient underwent cardioversion as planned with restoration of sinus rhythm.  He is pending a BiV ICD upgrade next month.  He complains of progressive shortness of breath with minimal exertion.  He complains of abdominal fullness and lower extremity edema as well.  His appetite is poor.  He is in a wheelchair today and is not able to do any physical activity whatsoever.  He also complains of orthopnea but no PND.  With exertion he  also complains of upper chest discomfort and lower abdominal discomfort.  He complains of persistent cough and 11 pound weight gain since the beginning of this year.  States that he feels no better after cardioversion.  Past Medical History:  Diagnosis Date   Acute on chronic combined systolic (congestive) and diastolic (congestive) heart failure (HCC)    AICD (automatic cardioverter/defibrillator) present    [employer]n PPM/ICD   Carotid stenosis, right    s/p endarterectomy 11/07/18   Chronic combined systolic (congestive) and diastolic (congestive) heart failure (HCC)    Coronary artery disease    [facility]   Diabetes mellitus    Dysrhythmia    Endocarditis, valve unspecified    Mitral   Headache    hx migraines 6-7 x mo   History of migraine headaches    History of seasonal allergies    ICD (implantable cardiac defibrillator) in place    Optic nerve hemorrhage, 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  We will monitor this closely and look for changes over the ensuing 6 months.  Repeat visit here in 6 months   PAF (paroxysmal atrial fibrillation) (HCC)    NO LAA occlusion at the time of surgery  [name] 2018   Pleural effusion    PVC's (premature ventricular contractions)    Ventricular tachycardia, polymorphic (HCC)    status  post ICD implantation    Past Surgical History:  Procedure Laterality Date   BRONCHIAL BIOPSY  07/22/2020   Procedure: BRONCHIAL BIOPSIES;  Surgeon: Collene Gobble, MD;  Location: [facility];  Service: Cardiopulmonary;;   BRONCHIAL BRUSHINGS  07/22/2020   Procedure: BRONCHIAL BRUSHINGS;  Surgeon: Collene Gobble, MD;  Location: [facility];  Service: Cardiopulmonary;;   BRONCHIAL WASHINGS  07/22/2020   Procedure: BRONCHIAL WASHINGS;  Surgeon: Collene Gobble, MD;  Location: [facility];  Service: Cardiopulmonary;;   CARDIAC CATHETERIZATION   04/03/2007   EF 40%   CARDIAC CATHETERIZATION  02/06/2006   EF 45%. ANTERIOR HYPOKINESIS   CARDIAC CATHETERIZATION  05/29/2000   EF 50%. MILD ANTERIOR HYPOKINESIS   CARDIAC CATHETERIZATION  10/25/1999   EF 50%. SEVERE MITRAL REGURGITATION   CARDIAC CATHETERIZATION  01/06/2003   EF 50%   CARDIAC DEFIBRILLATOR PLACEMENT     CARDIOVERSION N/A 02/07/2019   Procedure: CARDIOVERSION;  Surgeon: Fay Records, MD;  Location: [facility] ENDOSCOPY;  Service: Cardiovascular;  Laterality: N/A;   CARDIOVERSION N/A 11/24/2019   Procedure: CARDIOVERSION;  Surgeon: Josue Hector, MD;  Location: [place];  Service: Cardiovascular;  Laterality: N/A;   CARDIOVERSION N/A 02/15/2022   Procedure: CARDIOVERSION;  Surgeon: Jerline Pain, MD;  Location: [place] ENDOSCOPY;  Service: Cardiovascular;  Laterality: N/A;   CATARACT EXTRACTION W/ INTRAOCULAR LENS  IMPLANT, BILATERAL     CHOLECYSTECTOMY N/A 01/08/2019   Procedure: LAPAROSCOPIC CHOLECYSTECTOMY WITH INTRAOPERATIVE CHOLANGIOGRAM;  Surgeon: Donnie Mesa, MD;  Location: [place];  Service: General;  Laterality: N/A;   CORONARY ARTERY BYPASS GRAFT  2001   2 VESSEL CABG AND MITRAL VALVE REPAIR. HE HAD A LIMA GRAFT TO THE LAD AND RADIAL ARTERY  GRAFT TO THE OBTUSE MARGINAL  OF LEFT CIRCUMFLEX   ENDARTERECTOMY Right 11/07/2018   Procedure: ENDARTERECTOMY CAROTID RIGHT;  Surgeon: Waynetta Sandy, MD;  Location: [place];  Service: Vascular;  Laterality: Right;   EP IMPLANTABLE DEVICE N/A 11/06/2014   Procedure: ICD Generator Changeout;  Surgeon: Deboraha Sprang, MD;  Location: [place] CV LAB;  Service: Cardiovascular;  Laterality: N/A;   ERCP N/A 01/10/2019   Procedure: ENDOSCOPIC RETROGRADE CHOLANGIOPANCREATOGRAPHY (ERCP);  Surgeon: Clarene Essex, MD;  Location: [place];  Service: Endoscopy;  Laterality: N/A;   FOREIGN BODY REMOVAL Right 06/21/2017   Procedure: FOREIGN BODY REMOVAL ADULT RIGHT RING FINGER;  Surgeon: Leanora Cover, MD;  Location: [place];  Service:  Orthopedics;  Laterality: Right;   HEMORRHOID SURGERY     HEMORROIDECTOMY     INTRAVASCULAR PRESSURE WIRE/FFR STUDY N/A 01/05/2017   Procedure: INTRAVASCULAR PRESSURE WIRE/FFR STUDY;  Surgeon: Sherren Mocha, MD;  Location: [place] CV LAB;  Service: Cardiovascular;  Laterality: N/A;   KNEE ARTHROSCOPY     RIGHT KNEE   LAPAROSCOPIC APPENDECTOMY N/A 01/08/2019   Procedure: APPENDECTOMY LAPAROSCOPIC;  Surgeon: Donnie Mesa, MD;  Location: [place];  Service: General;  Laterality: N/A;   LEFT HEART CATHETERIZATION WITH CORONARY ANGIOGRAM N/A 07/04/2012   Procedure: LEFT HEART CATHETERIZATION WITH CORONARY ANGIOGRAM;  Surgeon: Sherren Mocha, MD;  Location: [facility] CATH LAB;  Service: Cardiovascular;  Laterality: N/A;   MITRAL VALVE REPLACEMENT     No LAA occlusion at the time of surgery  Karie Soda report 2018   REMOVAL OF STONES  01/10/2019   Procedure: REMOVAL OF STONES;  Surgeon: Clarene Essex, MD;  Location: [place];  Service: Endoscopy;;   RIGHT/LEFT HEART CATH AND CORONARY/GRAFT ANGIOGRAPHY N/A 01/05/2017   Procedure: RIGHT/LEFT HEART CATH AND  CORONARY/GRAFT ANGIOGRAPHY;  Surgeon: Sherren Mocha, MD;  Location: [place] CV LAB;  Service: Cardiovascular;  Laterality: N/A;   SPHINCTEROTOMY  01/10/2019   Procedure: SPHINCTEROTOMY;  Surgeon: Clarene Essex, MD;  Location: [place];  Service: Endoscopy;;   TRANSTHORACIC ECHOCARDIOGRAM  04/02/2007   EF 35%   VIDEO BRONCHOSCOPY Right 07/22/2020   Procedure: VIDEO BRONCHOSCOPY WITH FLUORO, BAL AND BIOPSY;  Surgeon: Collene Gobble, MD;  Location: [facility];  Service: Cardiopulmonary;  Laterality: Right;  CONSCIOUS SEDATION OR MAC    Current Medications: Current Meds  Medication Sig   albuterol (VENTOLIN HFA) 108 (90 Base) MCG/ACT inhaler Inhale 2 puffs into the lungs every 6 (six) hours as needed for wheezing or shortness of breath.   amiodarone (PACERONE) 200 MG tablet Take 2 tablets (447m) twice daily x 2 weeks then 2 tablets (4034m daily.  (Patient taking differently: Take 400 mg by mouth every morning. Take 2 tablets (40060mtwice daily x 2 weeks then 2 tablets (400m65maily.)   atorvastatin (LIPITOR) 80 MG tablet TAKE ONE TABLET BY MOUTH EVERY DAY FOR CHOLESTEROL (Patient taking differently: Take 80 mg by mouth at bedtime.)   ELIQUIS 5 MG TABS tablet TAKE ONE TABLET BY MOUTH TWICE DAILY   empagliflozin (JARDIANCE) 10 MG TABS tablet Take 10 mg by mouth in the morning.   EPIPEN 2-PAK 0.3 MG/0.3ML SOAJ injection Inject 0.3 mg into the muscle daily as needed for anaphylaxis.    Erenumab-aooe (AIMOVIG) 140 MG/ML SOAJ Inject 140 mg into the skin every 28 (twenty-eight) days.   fluticasone (FLONASE) 50 MCG/ACT nasal spray Place 1 spray into the nose daily as needed for rhinitis or allergies.   ibuprofen (ADVIL) 200 MG tablet Take 400 mg by mouth every 8 (eight) hours as needed (headaches/migraine).   Magnesium 250 MG TABS Take 250 mg by mouth in the morning.   metoprolol succinate (TOPROL-XL) 25 MG 24 hr tablet TAKE ONE TABLET BY MOUTH TWICE DAILY WITH OR IMMEDIATELY FOLLOWING A MEAL   Multiple Vitamin (MULTIVITAMIN WITH MINERALS) TABS tablet Take 1 tablet by mouth at bedtime. Centrum Silver   Multiple Vitamins-Minerals (PRESERVISION AREDS 2) CAPS Take 1 capsule by mouth daily.   Multiple Vitamins-Minerals (PRESERVISION AREDS 2) CAPS Take by mouth.   nateglinide (STARLIX) 60 MG tablet Take 60 mg by mouth 2 (two) times daily with a meal.   ONETOUCH VERIO test strip SMARTSIG:Via Meter 1-2 Times Daily   sitaGLIPtin (JANUVIA) 100 MG tablet Take 100 mg by mouth at bedtime.   Tiotropium Bromide-Olodaterol (STIOLTO RESPIMAT) 2.5-2.5 MCG/ACT AERS Inhale 2 puffs into the lungs at bedtime. (Patient taking differently: Inhale 2 puffs into the lungs every morning.)   torsemide (DEMADEX) 20 MG tablet Take 2 tablets (40mg68m mouth twice daily for 3 days, then decrease to 2 tablets (40mg)98me daily thereafter   [DISCONTINUED] furosemide (LASIX) 40 MG  tablet Take 1 tablet (40 mg total) by mouth daily. (Patient taking differently: Take 40 mg by mouth every other day. In the morning)     Allergies:   Bee venom, Latex, Metformin, Rivaroxaban, Sotalol, Levofloxacin, and Warfarin sodium   Social History   Socioeconomic History   Marital status: Married    Spouse name: PatricMardene Celesteber of children: 2   Years of education: Not on file   Highest education level: Associate degree: academic program  Occupational History   Occupation: retired  Tobacco Use   Smoking status: Former    Packs/day: 2.00    Years: 16.00  Total pack years: 32.00    Types: Cigarettes    Start date: 02/23/1956    Quit date: 01/31/1971    Years since quitting: 51.1   Smokeless tobacco: Never  Vaping Use   Vaping Use: Never used  Substance and Sexual Activity   Alcohol use: Yes    Alcohol/week: 0.0 standard drinks of alcohol    Comment: 1-2 a week   Drug use: No   Sexual activity: Yes  Other Topics Concern   Not on file  Social History Narrative   Patient is right-handed. He lives with his wife ina 2 story home. He drinks one cup of coffee and a diet coke a day.    Social Determinants of Health   Financial Resource Strain: Not on file  Food Insecurity: Not on file  Transportation Needs: Not on file  Physical Activity: Not on file  Stress: Not on file  Social Connections: Not on file     Family History: The patient's family history includes Cancer in his sister and another family member; Diabetes in an other family member; Emphysema in his father; Heart disease in his father, paternal grandfather, and paternal grandmother; Stroke in his mother.  ROS:   Please see the history of present illness.    All other systems reviewed and are negative.  EKGs/Labs/Other Studies Reviewed:    The following studies were reviewed today: Echo 05/06/2020: 1. Global hypokinesis worse in the mid-apical septal and apical  myocardium. Left ventricular ejection  fraction, by estimation, is 35 to  40%. The left ventricle has moderately decreased function. The left  ventricle demonstrates global hypokinesis. There  is mild left ventricular hypertrophy of the posterior segment. Left  ventricular diastolic parameters are indeterminate. Elevated left  ventricular end-diastolic pressure.   2. Right ventricular systolic function is mildly reduced. The right  ventricular size is normal. There is moderately elevated pulmonary artery  systolic pressure.   3. Left atrial size was severely dilated.   4. Right atrial size was severely dilated.   5. Mean mitral valve gradient unchanged from 10/2018. The mitral valve is  degenerative. Trivial mitral valve regurgitation. Mild mitral stenosis.  The mean mitral valve gradient is 3.0 mmHg.   6. Tricuspid valve regurgitation is mild to moderate.   7. The aortic valve is normal in structure. There is mild calcification  of the aortic valve. There is mild thickening of the aortic valve. Aortic  valve regurgitation is not visualized. No aortic stenosis is present.   8. Aortic dilatation noted. There is mild dilatation of the aortic root,  measuring 40 mm. There is mild dilatation of the ascending aorta,  measuring 37 mm.   9. The inferior vena cava is normal in size with greater than 50%  respiratory variability, suggesting right atrial pressure of 3 mmHg.    EKG:  EKG is not ordered today.    Recent Labs: 02/07/2022: ALT 20; Platelets 116; TSH 5.170 02/15/2022: BUN 27; Creatinine, Ser 1.30; Hemoglobin 16.3; Potassium 5.0; Sodium 136  Recent Lipid Panel    Component Value Date/Time   CHOL 110 11/05/2018 0256   TRIG 90 11/05/2018 0256   HDL 48 11/05/2018 0256   CHOLHDL 2.3 11/05/2018 0256   VLDL 18 11/05/2018 0256   LDLCALC 44 11/05/2018 0256     Risk Assessment/Calculations:    CHA2DS2-VASc Score = 5   This indicates a 7.2% annual risk of stroke. The patient's score is based upon: CHF History: 1 HTN  History: 0 Diabetes History:  1 Stroke History: 0 Vascular Disease History: 1 Age Score: 2 Gender Score: 0           Physical Exam:    VS:  BP 110/70   Pulse 88   Ht 6' 1"  (1.854 m)   Wt 212 lb 12.8 oz (96.5 kg)   SpO2 96%   BMI 28.08 kg/m     Wt Readings from Last 3 Encounters:  02/21/22 212 lb 12.8 oz (96.5 kg)  02/15/22 209 lb 7 oz (95 kg)  02/07/22 209 lb 6.4 oz (95 kg)     GEN:  elderly man, alert, oriented, NAD, in wheelchair HEENT: Normal NECK: JVP elevated with HJR LYMPHATICS: No lymphadenopathy CARDIAC: RRR, no murmurs, rubs, gallops RESPIRATORY:  Clear to auscultation without rales, wheezing or rhonchi  ABDOMEN: Soft, non-tender, distended MUSCULOSKELETAL:  1+ BL pretibial edema; No deformity  SKIN: Warm and dry, purpleish discoloration of the feet NEUROLOGIC:  Alert and oriented x 3 PSYCHIATRIC:  Normal affect   ASSESSMENT:    1. Acute on chronic systolic heart failure ([place])   2. Bilateral lower extremity edema   3. Coronary artery disease involving native coronary artery of native heart with angina pectoris (HCC)   4. Persistent atrial fibrillation (HCC)    PLAN:    In order of problems listed above:  The patient has progressive symptoms of acute on chronic systolic heart failure with volume overload on exam.  Lengthy discussion with the patient and his family today regarding treatment options.  He would like to avoid hospitalization if at all possible.  I think it is reasonable to try to diurese him as an outpatient with very close clinical follow-up.  He may have progressive LV dysfunction or valvular heart disease.  Will order an updated 2D echocardiogram.  I asked him to discontinue furosemide and start torsemide 40 mg twice daily for 3 days, then 40 mg daily thereafter.  He will return for follow-up with me next week with a metabolic panel and BNP to be drawn at that time.  He understands that if his symptoms worsen he will need to call EMS or go to  the emergency department.  He is oxygenating well today with an oxygen saturation of 96% on room air and does not exhibit signs of acute respiratory distress.  We had discussions regarding goals of care as well.  The patient has a DNR and we discussed the potential for hospice care, pending his response to therapy. Related to his heart failure.  While his feet are discolored, he is not experiencing any pain in the feet are not cold.  No evidence of acute arterial insufficiency. Continue medical management, primary issue is heart failure at this point in time Anticoagulated with apixaban, treated with amiodarone and recent cardioversion noted.  Followed by Dr. Caryl Comes. Will need to decide on Bi-V upgrade - reassess next week at time of follow-up office visit.      Medication Adjustments/Labs and Tests Ordered: Current medicines are reviewed at length with the patient today.  Concerns regarding medicines are outlined above.  Orders Placed This Encounter  Procedures   Basic metabolic panel   Pro b natriuretic peptide (BNP)   ECHOCARDIOGRAM COMPLETE   Meds ordered this encounter  Medications   torsemide (DEMADEX) 20 MG tablet    Sig: Take 2 tablets (60m) by mouth twice daily for 3 days, then decrease to 2 tablets (436m once daily thereafter    Dispense:  180 tablet    Refill:  3    Patient Instructions  Medication Instructions:  START Torsemide TODAY: Take 45m by mouth twice daily for 3 days, then decrease to 471mdaily thereafter *If you need a refill on your cardiac medications before your next appointment, please call your pharmacy*   Lab Work: BMET, BNP on 03/01/22 If you have labs (blood work) drawn today and your tests are completely normal, you will receive your results only by: [place] you have MyChart) OR A paper copy in the mail If you have any lab test that is abnormal or we need to change your treatment, we will call you to review the  results.   Testing/Procedures: ECHO Your physician has requested that you have an echocardiogram. Echocardiography is a painless test that uses sound waves to create images of your heart. It provides your doctor with information about the size and shape of your heart and how well your heart's chambers and valves are working. This procedure takes approximately one hour. There are no restrictions for this procedure. Please do NOT wear cologne, perfume, aftershave, or lotions (deodorant is allowed). Please arrive 15 minutes prior to your appointment time.  Follow-Up: [facility] and your [facility] needs are our priority.  As part of our continuing mission to provide you with exceptional heart care, we have created designated Provider Care Teams.  These Care Teams include your primary Cardiologist (physician) and Advanced Practice Providers (APPs -  Physician Assistants and Nurse Practitioners) who all work together to provide you with the care you need, when you need it.  We recommend signing up for the patient portal called "MyChart".  Sign up information is provided on this After Visit Summary.  MyChart is used to connect with patients for Virtual Visits (Telemedicine).  Patients are able to view lab/test results, encounter notes, upcoming appointments, etc.  Non-urgent messages can be sent to your provider as well.   To learn more about what you can do with MyChart, go to [email or web address]   Your next appointment:   03/01/22 @ 10:20  Provider:   MiSherren MochaMD        Signed, MiSherren MochaMD  02/27/2022 5:42 AM    [place]

## 2022-02-27 ENCOUNTER — Encounter: Payer: Self-pay | Admitting: Cardiovascular Disease

## 2022-03-01 ENCOUNTER — Encounter: Payer: Medicare Other | Admitting: Physician Assistant

## 2022-03-01 ENCOUNTER — Ambulatory Visit (INDEPENDENT_AMBULATORY_CARE_PROVIDER_SITE_OTHER): Payer: Medicare Other | Admitting: Internal Medicine

## 2022-03-01 ENCOUNTER — Ambulatory Visit (INDEPENDENT_AMBULATORY_CARE_PROVIDER_SITE_OTHER): Payer: Medicare Other | Admitting: Cardiovascular Disease

## 2022-03-01 ENCOUNTER — Ambulatory Visit: Payer: Medicare Other | Admitting: Cardiovascular Disease

## 2022-03-01 ENCOUNTER — Encounter: Payer: Self-pay | Admitting: Cardiovascular Disease

## 2022-03-01 ENCOUNTER — Ambulatory Visit: Payer: Medicare Other | Attending: Cardiovascular Disease

## 2022-03-01 ENCOUNTER — Encounter: Payer: Self-pay | Admitting: Internal Medicine

## 2022-03-01 VITALS — BP 120/80 | HR 86 | Ht 73.0 in | Wt 194.6 lb

## 2022-03-01 DIAGNOSIS — I25119 Atherosclerotic heart disease of native coronary artery with unspecified angina pectoris: Secondary | ICD-10-CM

## 2022-03-01 DIAGNOSIS — I4819 Other persistent atrial fibrillation: Secondary | ICD-10-CM

## 2022-03-01 DIAGNOSIS — R600 Localized edema: Secondary | ICD-10-CM | POA: Diagnosis not present

## 2022-03-01 DIAGNOSIS — I5023 Acute on chronic systolic (congestive) heart failure: Secondary | ICD-10-CM | POA: Diagnosis not present

## 2022-03-01 DIAGNOSIS — Z01810 Encounter for preprocedural cardiovascular examination: Secondary | ICD-10-CM

## 2022-03-01 MED ORDER — AMIODARONE HCL 200 MG PO TABS: 400.0000 mg | ORAL_TABLET | Freq: Every day | ORAL | 3 refills | Status: DC

## 2022-03-01 NOTE — Patient Instructions (Signed)
Medication Instructions:  Your physician recommends that you continue on your current medications as directed. Please refer to the Current Medication list given to you today.  *If you need a refill on your cardiac medications before your next appointment, please call your pharmacy*  Lab Work: CBC, CMET, BNP today If you have labs (blood work) drawn today and your tests are completely normal, you will receive your results only by: [place] (if you have MyChart) OR A paper copy in the mail If you have any lab test that is abnormal or we need to change your treatment, we will call you to review the results.  Testing/Procedures: Cardioversion Your physician has recommended that you have a Cardioversion (DCCV). Electrical Cardioversion uses a jolt of electricity to your heart either through paddles or wired patches attached to your chest. This is a controlled, usually prescheduled, procedure. Defibrillation is done under light anesthesia in the hospital, and you usually go home the day of the procedure. This is done to get your heart back into a normal rhythm. You are not awake for the procedure. Please see the instruction sheet given to you today.  Follow-Up: At [facility], you and your health needs are our priority.  As part of our continuing mission to provide you with exceptional heart care, we have created designated Provider Care Teams.  These Care Teams include your primary Cardiologist (physician) and Advanced Practice Providers (APPs -  Physician Assistants and Nurse Practitioners) who all work together to provide you with the care you need, when you need it.  Your next appointment:   03/07/22 '@4'$ :20  Provider:   Sherren Mocha, MD      Other Instructions   You are scheduled for a Cardioversion on Friday, February 2 with Dr. Margaretann Loveless.  Please arrive at the [facility] (Main Entrance A) at [facility]: [address] [place], [place] [address] at 8:00 AM.     DIET:  Nothing to eat or drink after midnight except a sip of water with medications (see medication instructions below)  MEDICATION INSTRUCTIONS:  HOLD Torsemide the morning of procedure Continue taking your anticoagulant (blood thinner): Apixaban (Eliquis).  You will need to continue this after your procedure until you are told by your provider that it is safe to stop.    LABS: Today  FYI:  For your safety, and to allow [name] to monitor your vital signs accurately during the surgery/procedure we request: If you have artificial nails, gel coating, SNS etc, please have those removed prior to your surgery/procedure. Not having the nail coverings /polish removed may result in cancellation or delay of your surgery/procedure.  You must have a responsible person to drive you home and stay in the waiting area during your procedure. Failure to do so could result in cancellation.  Bring your insurance cards.  *Special Note: Every effort is made to have your procedure done on time. Occasionally there are emergencies that occur at the hospital that may cause delays. Please be patient if a delay does occur.

## 2022-03-01 NOTE — Progress Notes (Signed)
Cardiology Office Note:    Date:  03/06/2022   ID:  [name], DOB 1931-06-29, MRN [id number]  PCP:  Sueanne Margarita, [place] [facility] Cardiologist:  Sherren Mocha, MD Electrophysiologist:  [name], MD     Referring MD: Sueanne Margarita, DO   Chief Complaint  Patient presents with   Shortness of Breath    History of Present Illness:    [name] is a 89 y.o. male with a hx of   Coronary artery disease  Mitral valve disease S/p CABG, MV repair  Cath 12/18: oLAD 81, pLAD 79, oD1 6; oLCx 49, OM2 95; RCA irregs; R Radial-OM2 and L-LAD patent >> Med rx  Hypertension  Diabetes mellitus  Persistent atrial fibrillation  Dofetilide Rx, Apixaban Rx  (HFrEF) heart failure with reduced ejection fraction  Echocardiogram 10/2018: EF 35-40 Echocardiogram 4/22: EF 35-40  Pulmonary hypertension  Echocardiogram 4/22: RVSP 45.5  Ventricular tachycardia  S/p ICD  Carotid artery disease S/p R CEA  Abdominal aortic aneurysm  CT 12/2018 5.7 cm (followed by vasc surg) -to be managed conservatively  The patient returns with his wife and daughter today.  He was just seen last week with decompensated heart failure.  He had failed cardioversion and we discussed moving his care in a more palliative direction.  We decided to attempt outpatient diuresis to see if we could decongest him.  He was started on torsemide 40 mg twice daily for a few days followed by torsemide 40 mg daily thereafter.  He has diuresed 18 pounds and is feeling better.  He continues to have fatigue and exertional dyspnea, but his orthopnea and leg edema have improved dramatically.  His abdomen still feels distended but it has also improved significantly.  As part of his evaluation today we specifically discussed issues around his upcoming device upgrade to a biventricular pacemaker.  He clearly expresses that his wishes are consistent with DNR and he wants his defibrillator/high-voltage therapies  turned off.  Dr. Caryl Comes participates in today's visit as well.  Past Medical History:  Diagnosis Date   Acute on chronic combined systolic (congestive) and diastolic (congestive) heart failure (HCC)    AICD (automatic cardioverter/defibrillator) present    [employer]n PPM/ICD   Carotid stenosis, right    s/p endarterectomy 11/07/18   Chronic combined systolic (congestive) and diastolic (congestive) heart failure (HCC)    Coronary artery disease    [facility]   Diabetes mellitus    Dysrhythmia    Endocarditis, valve unspecified    Mitral   Headache    hx migraines 6-7 x mo   History of migraine headaches    History of seasonal allergies    ICD (implantable cardiac defibrillator) in place    Optic nerve hemorrhage, 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  We will monitor this closely and look for changes over the ensuing 6 months.  Repeat visit here in 6 months   PAF (paroxysmal atrial fibrillation) (HCC)    NO LAA occlusion at the time of surgery  [name] 2018   Pleural effusion    PVC's (premature ventricular contractions)    Ventricular tachycardia, polymorphic ([place])    status post ICD implantation    Past Surgical History:  Procedure Laterality Date   BRONCHIAL BIOPSY  07/22/2020   Procedure: BRONCHIAL BIOPSIES;  Surgeon: Collene Gobble, MD;  Location: [facility];  Service: Cardiopulmonary;;   BRONCHIAL BRUSHINGS  07/22/2020   Procedure: BRONCHIAL BRUSHINGS;  Surgeon: Collene Gobble, MD;  Location: [facility] ENDOSCOPY;  Service: Cardiopulmonary;;   BRONCHIAL WASHINGS  07/22/2020   Procedure: BRONCHIAL WASHINGS;  Surgeon: Collene Gobble, MD;  Location: [facility] ENDOSCOPY;  Service: Cardiopulmonary;;   CARDIAC CATHETERIZATION  04/03/2007   EF 40%   CARDIAC CATHETERIZATION  02/06/2006   EF 45%. ANTERIOR HYPOKINESIS   CARDIAC CATHETERIZATION  05/29/2000   EF 50%. MILD ANTERIOR  HYPOKINESIS   CARDIAC CATHETERIZATION  10/25/1999   EF 50%. SEVERE MITRAL REGURGITATION   CARDIAC CATHETERIZATION  01/06/2003   EF 50%   CARDIAC DEFIBRILLATOR PLACEMENT     CARDIOVERSION N/A 02/07/2019   Procedure: CARDIOVERSION;  Surgeon: Fay Records, MD;  Location: [facility] ENDOSCOPY;  Service: Cardiovascular;  Laterality: N/A;   CARDIOVERSION N/A 11/24/2019   Procedure: CARDIOVERSION;  Surgeon: Josue Hector, MD;  Location: [place];  Service: Cardiovascular;  Laterality: N/A;   CARDIOVERSION N/A 02/15/2022   Procedure: CARDIOVERSION;  Surgeon: Jerline Pain, MD;  Location: [facility] ENDOSCOPY;  Service: Cardiovascular;  Laterality: N/A;   CATARACT EXTRACTION W/ INTRAOCULAR LENS  IMPLANT, BILATERAL     CHOLECYSTECTOMY N/A 01/08/2019   Procedure: LAPAROSCOPIC CHOLECYSTECTOMY WITH INTRAOPERATIVE CHOLANGIOGRAM;  Surgeon: Donnie Mesa, MD;  Location: [place];  Service: General;  Laterality: N/A;   CORONARY ARTERY BYPASS GRAFT  2001   2 VESSEL CABG AND MITRAL VALVE REPAIR. HE HAD A LIMA GRAFT TO THE LAD AND RADIAL ARTERY  GRAFT TO THE OBTUSE MARGINAL  OF LEFT CIRCUMFLEX   ENDARTERECTOMY Right 11/07/2018   Procedure: ENDARTERECTOMY CAROTID RIGHT;  Surgeon: Waynetta Sandy, MD;  Location: [place];  Service: Vascular;  Laterality: Right;   EP IMPLANTABLE DEVICE N/A 11/06/2014   Procedure: ICD Generator Changeout;  Surgeon: Deboraha Sprang, MD;  Location: [place] CV LAB;  Service: Cardiovascular;  Laterality: N/A;   ERCP N/A 01/10/2019   Procedure: ENDOSCOPIC RETROGRADE CHOLANGIOPANCREATOGRAPHY (ERCP);  Surgeon: Clarene Essex, MD;  Location: [place];  Service: Endoscopy;  Laterality: N/A;   FOREIGN BODY REMOVAL Right 06/21/2017   Procedure: FOREIGN BODY REMOVAL ADULT RIGHT RING FINGER;  Surgeon: Leanora Cover, MD;  Location: [place];  Service: Orthopedics;  Laterality: Right;   HEMORRHOID SURGERY     HEMORROIDECTOMY     INTRAVASCULAR PRESSURE WIRE/FFR STUDY N/A 01/05/2017   Procedure:  INTRAVASCULAR PRESSURE WIRE/FFR STUDY;  Surgeon: Sherren Mocha, MD;  Location: [place] CV LAB;  Service: Cardiovascular;  Laterality: N/A;   KNEE ARTHROSCOPY     RIGHT KNEE   LAPAROSCOPIC APPENDECTOMY N/A 01/08/2019   Procedure: APPENDECTOMY LAPAROSCOPIC;  Surgeon: Donnie Mesa, MD;  Location: [place];  Service: General;  Laterality: N/A;   LEFT HEART CATHETERIZATION WITH CORONARY ANGIOGRAM N/A 07/04/2012   Procedure: LEFT HEART CATHETERIZATION WITH CORONARY ANGIOGRAM;  Surgeon: Sherren Mocha, MD;  Location: [facility] CATH LAB;  Service: Cardiovascular;  Laterality: N/A;   MITRAL VALVE REPLACEMENT     No LAA occlusion at the time of surgery  Karie Soda report 2018   REMOVAL OF STONES  01/10/2019   Procedure: REMOVAL OF STONES;  Surgeon: Clarene Essex, MD;  Location: [place];  Service: Endoscopy;;   RIGHT/LEFT HEART CATH AND CORONARY/GRAFT ANGIOGRAPHY N/A 01/05/2017   Procedure: RIGHT/LEFT HEART CATH AND CORONARY/GRAFT ANGIOGRAPHY;  Surgeon: Sherren Mocha, MD;  Location: [place] CV LAB;  Service: Cardiovascular;  Laterality: N/A;   SPHINCTEROTOMY  01/10/2019   Procedure: SPHINCTEROTOMY;  Surgeon: Clarene Essex, MD;  Location: [facility] ENDOSCOPY;  Service: Endoscopy;;   TRANSTHORACIC ECHOCARDIOGRAM  04/02/2007   EF 35%   VIDEO BRONCHOSCOPY Right 07/22/2020   Procedure: VIDEO BRONCHOSCOPY WITH FLUORO, BAL AND BIOPSY;  Surgeon: Collene Gobble, MD;  Location: [facility];  Service: Cardiopulmonary;  Laterality: Right;  CONSCIOUS SEDATION OR MAC    Current Medications: Current Meds  Medication Sig   albuterol (VENTOLIN HFA) 108 (90 Base) MCG/ACT inhaler Inhale 2 puffs into the lungs every 6 (six) hours as needed for wheezing or shortness of breath.   atorvastatin (LIPITOR) 80 MG tablet TAKE ONE TABLET BY MOUTH EVERY DAY FOR CHOLESTEROL (Patient taking differently: Take 80 mg by mouth at bedtime.)   ELIQUIS 5 MG TABS tablet TAKE ONE TABLET BY MOUTH TWICE DAILY   empagliflozin (JARDIANCE) 10 MG TABS  tablet Take 10 mg by mouth in the morning.   EPIPEN 2-PAK 0.3 MG/0.3ML SOAJ injection Inject 0.3 mg into the muscle daily as needed for anaphylaxis.    Erenumab-aooe (AIMOVIG) 140 MG/ML SOAJ Inject 140 mg into the skin every 28 (twenty-eight) days.   fluticasone (FLONASE) 50 MCG/ACT nasal spray Place 1 spray into the nose daily as needed for rhinitis or allergies.   ibuprofen (ADVIL) 200 MG tablet Take 400 mg by mouth every 8 (eight) hours as needed (headaches/migraine).   Magnesium 250 MG TABS Take 250 mg by mouth in the morning.   metoprolol succinate (TOPROL-XL) 25 MG 24 hr tablet TAKE ONE TABLET BY MOUTH TWICE DAILY WITH OR IMMEDIATELY FOLLOWING A MEAL   Multiple Vitamin (MULTIVITAMIN WITH MINERALS) TABS tablet Take 1 tablet by mouth at bedtime. Centrum Silver   Multiple Vitamins-Minerals (PRESERVISION AREDS 2) CAPS Take 1 capsule by mouth as needed.   nateglinide (STARLIX) 60 MG tablet Take 60 mg by mouth 2 (two) times daily with a meal.   ONETOUCH VERIO test strip SMARTSIG:Via Meter 1-2 Times Daily   sitaGLIPtin (JANUVIA) 100 MG tablet Take 100 mg by mouth at bedtime.   Tiotropium Bromide-Olodaterol (STIOLTO RESPIMAT) 2.5-2.5 MCG/ACT AERS Inhale 2 puffs into the lungs at bedtime. (Patient taking differently: Inhale 2 puffs into the lungs every morning.)   torsemide (DEMADEX) 20 MG tablet Take 2 tablets (80m) by mouth twice daily for 3 days, then decrease to 2 tablets (443m once daily thereafter (Patient taking differently: Take 2 tablets (4053mby mouth twice daily for 3 days, then decrease to 2 tablets (39m67mnce daily thereafter. **PATIENT IS TAKING 20 MG DAILY)   [DISCONTINUED] amiodarone (PACERONE) 200 MG tablet Take 2 tablets (400mg24mice daily x 2 weeks then 2 tablets (400mg)45mly. (Patient taking differently: Take 400 mg by mouth every morning. Take 2 tablets (400mg) 46me daily x 2 weeks then 2 tablets (400mg) d88m.)   [DISCONTINUED] Multiple Vitamins-Minerals (PRESERVISION AREDS  2) CAPS Take by mouth.     Allergies:   Bee venom, Latex, Metformin, Rivaroxaban, Sotalol, Levofloxacin, and Warfarin sodium   Social History   Socioeconomic History   Marital status: Married    Spouse name: PatriciaMardene Celester of children: 2   Years of education: Not on file   Highest education level: Associate degree: academic program  Occupational History   Occupation: retired  Tobacco Use   Smoking status: Former    Packs/day: 2.00    Years: 16.00    Total pack years: 32.00    Types: Cigarettes    Start date: 02/23/1956    Quit date: 01/31/1971    Years since quitting: 51.1   Smokeless  tobacco: Never  Vaping Use   Vaping Use: Never used  Substance and Sexual Activity   Alcohol use: Yes    Alcohol/week: 0.0 standard drinks of alcohol    Comment: 1-2 a week   Drug use: No   Sexual activity: Yes  Other Topics Concern   Not on file  Social History Narrative   Patient is right-handed. He lives with his wife ina 2 story home. He drinks one cup of coffee and a diet coke a day.    Social Determinants of Health   Financial Resource Strain: Not on file  Food Insecurity: Not on file  Transportation Needs: Not on file  Physical Activity: Not on file  Stress: Not on file  Social Connections: Not on file     Family History: The patient's family history includes Cancer in his sister and another family member; Diabetes in an other family member; Emphysema in his father; Heart disease in his father, paternal grandfather, and paternal grandmother; Stroke in his mother.  ROS:   Please see the history of present illness.    All other systems reviewed and are negative.  EKGs/Labs/Other Studies Reviewed:    EKG:  EKG is not ordered today.   Recent Labs: 02/07/2022: TSH 5.170 03/01/2022: ALT 29; BUN 23; Creatinine, Ser 1.51; Hemoglobin 14.9; NT-Pro BNP 7,479; Platelets 129; Potassium 4.3; Sodium 139  Recent Lipid Panel    Component Value Date/Time   CHOL 110 11/05/2018 0256    TRIG 90 11/05/2018 0256   HDL 48 11/05/2018 0256   CHOLHDL 2.3 11/05/2018 0256   VLDL 18 11/05/2018 0256   LDLCALC 44 11/05/2018 0256     Risk Assessment/Calculations:    CHA2DS2-VASc Score = 5   This indicates a 7.2% annual risk of stroke. The patient's score is based upon: CHF History: 1 HTN History: 0 Diabetes History: 1 Stroke History: 0 Vascular Disease History: 1 Age Score: 2 Gender Score: 0           Physical Exam:    VS:  BP 120/80   Pulse 86   Ht 6' 1"  (1.854 m)   Wt 194 lb 9.6 oz (88.3 kg)   SpO2 97%   BMI 25.67 kg/m     Wt Readings from Last 3 Encounters:  03/03/22 186 lb (84.4 kg)  03/01/22 194 lb 9.6 oz (88.3 kg)  02/21/22 212 lb 12.8 oz (96.5 kg)     GEN: Elderly male in no acute distress HEENT: Normal NECK: No JVD; No carotid bruits LYMPHATICS: No lymphadenopathy CARDIAC: RRR, 2/6 systolic murmur at the LLSB RESPIRATORY:  Clear to auscultation without rales, wheezing or rhonchi  ABDOMEN: Soft, non-tender, non-distended MUSCULOSKELETAL:  mild lower extremity edema; No deformity  SKIN: Warm and dry NEUROLOGIC:  Alert and oriented x 3 PSYCHIATRIC:  Normal affect   ASSESSMENT:    1. Pre-procedural cardiovascular examination   2. Acute on chronic systolic heart failure (HCC)   3. Persistent atrial fibrillation (HCC)    PLAN:    In order of problems listed above:  Plan to pursue DCCV now that he has been diuresed and further loaded with amiodarone. Discussed this option at length today with input from Dr Caryl Comes. Risks/indications/alternatives to DCCV reviewed with the patient. He provides full informed consent.  Clinically improved with good diuresis. Will check labs to reassess renal function and electrolytes. Concerned that he is approaching end stage disease state. NYHA functional class 3-4 symptoms now. Awaiting 2D echo. Will arrange close clinical follow-up. Ongoing discussion  with the patient and family about goals of care/hospice. For  now, he would like to pursue cardioversion with hopes of improved quality of life. He no longer wishes to have invasive procedures or undergo life-prolonging interventions. His device upgrade will be cancelled.  DCCV as aboe. Anticoagulated with apixaban. Continue amiodarone.  Extended visit today discussing goals of care, DNR, device management, cardioversion, and quality versus life-prolonging measures. Will schedule close OP follow-up after cardioversion to evaluate for clinical improvement and potential referral to Hospice if he decides to move his care in a completely palliative direction. Also awaiting 2D echo result as above.         Shared Decision Making/Informed Consent The risks (stroke, cardiac arrhythmias rarely resulting in the need for a temporary or permanent pacemaker, skin irritation or burns and complications associated with conscious sedation including aspiration, arrhythmia, respiratory failure and death), benefits (restoration of normal sinus rhythm) and alternatives of a direct current cardioversion were explained in detail to Mr. Kalmbach and he agrees to proceed.      Medication Adjustments/Labs and Tests Ordered: Current medicines are reviewed at length with the patient today.  Concerns regarding medicines are outlined above.  Orders Placed This Encounter  Procedures   CBC   Comprehensive metabolic panel   No orders of the defined types were placed in this encounter.   Patient Instructions  Medication Instructions:  Your physician recommends that you continue on your current medications as directed. Please refer to the Current Medication list given to you today.  *If you need a refill on your cardiac medications before your next appointment, please call your pharmacy*  Lab Work: CBC, CMET, BNP today If you have labs (blood work) drawn today and your tests are completely normal, you will receive your results only by: [place] (if you have MyChart) OR A  paper copy in the mail If you have any lab test that is abnormal or we need to change your treatment, we will call you to review the results.  Testing/Procedures: Cardioversion Your physician has recommended that you have a Cardioversion (DCCV). Electrical Cardioversion uses a jolt of electricity to your heart either through paddles or wired patches attached to your chest. This is a controlled, usually prescheduled, procedure. Defibrillation is done under light anesthesia in the hospital, and you usually go home the day of the procedure. This is done to get your heart back into a normal rhythm. You are not awake for the procedure. Please see the instruction sheet given to you today.  Follow-Up: At [facility], you and your health needs are our priority.  As part of our continuing mission to provide you with exceptional heart care, we have created designated Provider Care Teams.  These Care Teams include your primary Cardiologist (physician) and Advanced Practice Providers (APPs -  Physician Assistants and Nurse Practitioners) who all work together to provide you with the care you need, when you need it.  Your next appointment:   03/07/22 @4 :20  Provider:   Sherren Mocha, MD      Other Instructions   You are scheduled for a Cardioversion on Friday, February 2 with Dr. Margaretann Loveless.  Please arrive at the [facility] (Main Entrance A) at [facility]: [address] [place], [place] [address] at 8:00 AM.    DIET:  Nothing to eat or drink after midnight except a sip of water with medications (see medication instructions below)  MEDICATION INSTRUCTIONS:  HOLD Torsemide the morning of procedure Continue taking  your anticoagulant (blood thinner): Apixaban (Eliquis).  You will need to continue this after your procedure until you are told by your provider that it is safe to stop.    LABS: Today  FYI:  For your safety, and to allow [name] to monitor your vital signs accurately during the  surgery/procedure we request: If you have artificial nails, gel coating, SNS etc, please have those removed prior to your surgery/procedure. Not having the nail coverings /polish removed may result in cancellation or delay of your surgery/procedure.  You must have a responsible person to drive you home and stay in the waiting area during your procedure. Failure to do so could result in cancellation.  Bring your insurance cards.  *Special Note: Every effort is made to have your procedure done on time. Occasionally there are emergencies that occur at the hospital that may cause delays. Please be patient if a delay does occur.       Signed, Sherren Mocha, MD  03/06/2022 3:05 PM    [place]

## 2022-03-01 NOTE — Progress Notes (Signed)
Discussion with Dr. Burt Knack and then the patient and his family regarding end-of-life issues specifically the inactivation of high-voltage therapies.  When we had spoken last he had wanted them continued, today he is quite clear that he would like them off.  This was done.  We also discussed opportunities that might help symptoms.  There are number of issues.  #1-it was his impression that he felt better with sinus rhythm for the days he had it following cardioversion.  I offered repeat cardioversion, he was inclined to pursue this.  His wife seems to be quite upset that he had chosen to do anything as she told me that prior to the visit the family had all decided that he was going to stop treatments and pursue palliative care.  He reiterated that he would like to do cardioversion.  2.,  The question would then become what to do with his battery of his device as he has reached ERI.  We elected to defer this but a couple of options were raised 1-to do nothing and let the battery wear out and whether he is device dependent or not except that consequence 2-to try to decrease the pacing rate to see how necessary his device is to prevent bradycardia, previously had been programmed at least in part to try to suppress PVCs 3-2 at the time of generator replacement consider CRT upgrade given the fact that he has a cardiomyopathy in the setting of his RV apical pacing.  I suggest that the likelihood of benefit is probably in the range of about 50%.  Following the above discussions, the device was reprogrammed was noted.  Dr. Burt Knack will undertake cardioversion.  We will see him in a couple of weeks, and the scheduled device procedure for February 9 will be canceled.

## 2022-03-02 ENCOUNTER — Encounter: Payer: Self-pay | Admitting: Internal Medicine

## 2022-03-02 LAB — CBC
Hematocrit: 44 % (ref 37.5–51.0)
Hemoglobin: 14.9 g/dL (ref 13.0–17.7)
MCH: 34.1 pg — ABNORMAL HIGH (ref 26.6–33.0)
MCHC: 33.9 g/dL (ref 31.5–35.7)
MCV: 101 fL — ABNORMAL HIGH (ref 79–97)
Platelets: 129 10*3/uL — ABNORMAL LOW (ref 150–450)
RBC: 4.37 x10E6/uL (ref 4.14–5.80)
RDW: 13.5 % (ref 11.6–15.4)
WBC: 8 10*3/uL (ref 3.4–10.8)

## 2022-03-02 LAB — COMPREHENSIVE METABOLIC PANEL
ALT: 29 IU/L (ref 0–44)
AST: 45 IU/L — ABNORMAL HIGH (ref 0–40)
Albumin/Globulin Ratio: 1.3 (ref 1.2–2.2)
Albumin: 4.1 g/dL (ref 3.7–4.7)
Alkaline Phosphatase: 168 IU/L — ABNORMAL HIGH (ref 44–121)
BUN/Creatinine Ratio: 15 (ref 10–24)
BUN: 23 mg/dL (ref 8–27)
Bilirubin Total: 2.5 mg/dL — ABNORMAL HIGH (ref 0.0–1.2)
CO2: 28 mmol/L (ref 20–29)
Calcium: 8.8 mg/dL (ref 8.6–10.2)
Chloride: 96 mmol/L (ref 96–106)
Creatinine, Ser: 1.51 mg/dL — ABNORMAL HIGH (ref 0.76–1.27)
Globulin, Total: 3.1 g/dL (ref 1.5–4.5)
Glucose: 245 mg/dL — ABNORMAL HIGH (ref 70–99)
Potassium: 4.3 mmol/L (ref 3.5–5.2)
Sodium: 139 mmol/L (ref 134–144)
Total Protein: 7.2 g/dL (ref 6.0–8.5)
eGFR: 44 mL/min/{1.73_m2} — ABNORMAL LOW (ref >59)

## 2022-03-02 NOTE — Telephone Encounter (Signed)
Thank you all for connecting me with patient.  You will see in the 2nd patient message that was sent over by patient in chart that this has been addressed.   [place], [employer] rep, is reaching out to patient today to coordinate taking care of this for him tomorrow am while he is at hospital for cardioversion.   Thanks.

## 2022-03-03 ENCOUNTER — Encounter (HOSPITAL_COMMUNITY)
Admission: RE | Disposition: A | Discharge status: Home / Self Care | Payer: Self-pay | Source: Home / Self Care | Attending: Internal Medicine

## 2022-03-03 ENCOUNTER — Ambulatory Visit (HOSPITAL_COMMUNITY): Payer: Medicare Other | Admitting: Certified Registered Nurse Anesthetist

## 2022-03-03 ENCOUNTER — Other Ambulatory Visit: Payer: Self-pay

## 2022-03-03 ENCOUNTER — Ambulatory Visit (HOSPITAL_COMMUNITY)
Admission: RE | Admit: 2022-03-03 | Discharge: 2022-03-03 | Disposition: A | Discharge status: Home / Self Care | Payer: Medicare Other | Attending: Internal Medicine | Admitting: Internal Medicine

## 2022-03-03 ENCOUNTER — Ambulatory Visit (HOSPITAL_BASED_OUTPATIENT_CLINIC_OR_DEPARTMENT_OTHER): Payer: Medicare Other | Admitting: Certified Registered Nurse Anesthetist

## 2022-03-03 ENCOUNTER — Encounter: Payer: Medicare Other | Admitting: Physician Assistant

## 2022-03-03 DIAGNOSIS — I110 Hypertensive heart disease with heart failure: Secondary | ICD-10-CM

## 2022-03-03 DIAGNOSIS — Z79899 Other long term (current) drug therapy: Secondary | ICD-10-CM | POA: Diagnosis not present

## 2022-03-03 DIAGNOSIS — Z7901 Long term (current) use of anticoagulants: Secondary | ICD-10-CM | POA: Insufficient documentation

## 2022-03-03 DIAGNOSIS — I4819 Other persistent atrial fibrillation: Secondary | ICD-10-CM

## 2022-03-03 DIAGNOSIS — I2510 Atherosclerotic heart disease of native coronary artery without angina pectoris: Secondary | ICD-10-CM

## 2022-03-03 DIAGNOSIS — Z87891 Personal history of nicotine dependence: Secondary | ICD-10-CM | POA: Insufficient documentation

## 2022-03-03 DIAGNOSIS — I081 Rheumatic disorders of both mitral and tricuspid valves: Secondary | ICD-10-CM | POA: Diagnosis not present

## 2022-03-03 DIAGNOSIS — Z7984 Long term (current) use of oral hypoglycemic drugs: Secondary | ICD-10-CM

## 2022-03-03 DIAGNOSIS — Z951 Presence of aortocoronary bypass graft: Secondary | ICD-10-CM | POA: Diagnosis not present

## 2022-03-03 DIAGNOSIS — I4891 Unspecified atrial fibrillation: Secondary | ICD-10-CM

## 2022-03-03 DIAGNOSIS — Z952 Presence of prosthetic heart valve: Secondary | ICD-10-CM | POA: Insufficient documentation

## 2022-03-03 DIAGNOSIS — I509 Heart failure, unspecified: Secondary | ICD-10-CM | POA: Diagnosis not present

## 2022-03-03 DIAGNOSIS — I480 Paroxysmal atrial fibrillation: Secondary | ICD-10-CM | POA: Insufficient documentation

## 2022-03-03 DIAGNOSIS — E119 Type 2 diabetes mellitus without complications: Secondary | ICD-10-CM

## 2022-03-03 DIAGNOSIS — J449 Chronic obstructive pulmonary disease, unspecified: Secondary | ICD-10-CM | POA: Insufficient documentation

## 2022-03-03 DIAGNOSIS — Z95810 Presence of automatic (implantable) cardiac defibrillator: Secondary | ICD-10-CM | POA: Insufficient documentation

## 2022-03-03 DIAGNOSIS — I5042 Chronic combined systolic (congestive) and diastolic (congestive) heart failure: Secondary | ICD-10-CM | POA: Diagnosis not present

## 2022-03-03 DIAGNOSIS — E1151 Type 2 diabetes mellitus with diabetic peripheral angiopathy without gangrene: Secondary | ICD-10-CM | POA: Diagnosis not present

## 2022-03-03 HISTORY — PX: CARDIOVERSION: SHX1299

## 2022-03-03 LAB — GLUCOSE, CAPILLARY: Glucose-Capillary: 145 mg/dL — ABNORMAL HIGH (ref 70–99)

## 2022-03-03 LAB — PRO B NATRIURETIC PEPTIDE: NT-Pro BNP: 7479 pg/mL — ABNORMAL HIGH (ref 0–486)

## 2022-03-03 SURGERY — CARDIOVERSION
Anesthesia: General

## 2022-03-03 MED ORDER — SODIUM CHLORIDE 0.9 % IV SOLN: INTRAVENOUS | Status: DC

## 2022-03-03 MED ORDER — PROPOFOL 10 MG/ML IV BOLUS
INTRAVENOUS | Status: DC | PRN
  Administered 2022-03-03 (×2): 20 mg via INTRAVENOUS
  Administered 2022-03-03: 10 mg via INTRAVENOUS

## 2022-03-03 MED ORDER — PHENYLEPHRINE HCL (PRESSORS) 10 MG/ML IV SOLN
INTRAVENOUS | Status: DC | PRN
  Administered 2022-03-03 (×2): 80 ug via INTRAVENOUS

## 2022-03-03 NOTE — Discharge Instructions (Signed)

## 2022-03-03 NOTE — Anesthesia Postprocedure Evaluation (Signed)
Anesthesia Post Note  Patient: [name]  Procedure(s) Performed: CARDIOVERSION     Anesthetic complications: no  No notable events documented.  Last Vitals:  Vitals:   03/03/22 0900 03/03/22 0910  BP: 96/77 100/72  Pulse: 87 87  Resp: (!) 22 (!) 22  Temp:    SpO2: 95% 95%    Last Pain:  Vitals:   03/03/22 0910  TempSrc:   PainSc: 0-No pain                 Amel Kitch

## 2022-03-03 NOTE — Anesthesia Preprocedure Evaluation (Signed)
Anesthesia Evaluation  Patient identified by MRN, date of birth, ID band Patient awake    Reviewed: Allergy & Precautions, NPO status , Patient's Chart, lab work & pertinent test results, reviewed documented beta blocker date and time   History of Anesthesia Complications Negative for: history of anesthetic complications  Airway Mallampati: II  TM Distance: >3 FB Neck ROM: Full    Dental  (+) Dental Advisory Given   Pulmonary COPD,  COPD inhaler, former smoker   breath sounds clear to auscultation       Cardiovascular hypertension, Pt. on medications and Pt. on home beta blockers (-) angina + CAD, + CABG, + Peripheral Vascular Disease and +CHF  + dysrhythmias Atrial Fibrillation and Ventricular Tachycardia + Cardiac Defibrillator (device last shocked 7 years ago) + Valvular Problems/Murmurs (s/p MVRepair)  Rhythm:Irregular  '22 ECHo: EF 35-40%, global hypokinesis, mild LVH, mild decrease in RVF, degenerative MV with mild MS, mean gradient 3 mmHg   Neuro/Psych  Headaches TIA negative psych ROS   GI/Hepatic negative GI ROS, Neg liver ROS,,,  Endo/Other  diabetes, Oral Hypoglycemic Agents    Renal/GU Renal InsufficiencyRenal disease     Musculoskeletal   Abdominal   Peds  Hematology  (+) Blood dyscrasia eliquis   Anesthesia Other Findings   Reproductive/Obstetrics                             Anesthesia Physical Anesthesia Plan  ASA: 4  Anesthesia Plan: General   Post-op Pain Management: Minimal or no pain anticipated   Induction: Intravenous  PONV Risk Score and Plan: 2 and Treatment may vary due to age or medical condition  Airway Management Planned: Natural Airway and Mask  Additional Equipment: None  Intra-op Plan:   Post-operative Plan:   Informed Consent: I have reviewed the patients History and Physical, chart, labs and discussed the procedure including the risks, benefits  and alternatives for the proposed anesthesia with the patient or authorized representative who has indicated his/her understanding and acceptance.     Dental advisory given  Plan Discussed with: CRNA and Surgeon  Anesthesia Plan Comments:         Anesthesia Quick Evaluation

## 2022-03-03 NOTE — CV Procedure (Signed)
Procedure: Electrical Cardioversion Indications:  Atrial Fibrillation  Procedure Details:  Consent: Risks of procedure as well as the alternatives and risks of each were explained to the (patient/caregiver).  Consent for procedure obtained.  Time Out: Verified patient identification, verified procedure, site/side was marked, verified correct patient position, special equipment/implants available, medications/allergies/relevent history reviewed, required imaging and test results available. PERFORMED.  Patient placed on cardiac monitor, pulse oximetry, supplemental oxygen as necessary.  Sedation given:  propofol per anesthesia Pacer pads placed anterior and posterior chest.  Cardioverted 1 time(s).  Cardioversion with synchronized biphasic 120J shock.  Evaluation: Findings: Post procedure EKG shows:  APVP Complications: None Patient did tolerate procedure well.  Time Spent Directly with the Patient:  30 minutes   Elouise Munroe 03/03/2022, 8:57 AM

## 2022-03-03 NOTE — Interval H&P Note (Signed)
History and Physical Interval Note:  03/03/2022 8:14 AM  [name]  has presented today for surgery, with the diagnosis of symptomatic afib.  The various methods of treatment have been discussed with the patient and family. After consideration of risks, benefits and other options for treatment, the patient has consented to  Procedure(s): CARDIOVERSION (N/A) as a surgical intervention.  The patient's history has been reviewed, patient examined, no change in status, stable for surgery.  I have reviewed the patient's chart and labs.  Questions were answered to the patient's satisfaction.     Elouise Munroe

## 2022-03-03 NOTE — Anesthesia Procedure Notes (Signed)
Procedure Name: General with mask airway Date/Time: 03/03/2022 8:45 AM  Performed by: Heide Scales, CRNAPre-anesthesia Checklist: Patient identified, Emergency Drugs available, Suction available and Patient being monitored Patient Re-evaluated:Patient Re-evaluated prior to induction Oxygen Delivery Method: Ambu bag Preoxygenation: Pre-oxygenation with 100% oxygen Induction Type: IV induction Dental Injury: Teeth and Oropharynx as per pre-operative assessment

## 2022-03-03 NOTE — Transfer of Care (Signed)
Immediate Anesthesia Transfer of Care Note  Patient: [name]  Procedure(s) Performed: CARDIOVERSION  Patient Location: Endoscopy Unit  Anesthesia Type:General  Level of Consciousness: awake, alert , and oriented  Airway & Oxygen Therapy: Patient Spontanous Breathing  Post-op Assessment: Report given to RN and Post -op Vital signs reviewed and stable  Post vital signs: Reviewed and stable  Last Vitals:  Vitals Value Taken Time  BP 105/76   Temp    Pulse 82   Resp 25   SpO2 97     Last Pain:  Vitals:   03/03/22 0719  TempSrc: Temporal  PainSc: 0-No pain         Complications: No notable events documented.

## 2022-03-04 ENCOUNTER — Encounter: Payer: Self-pay | Admitting: Internal Medicine

## 2022-03-06 ENCOUNTER — Ambulatory Visit (HOSPITAL_BASED_OUTPATIENT_CLINIC_OR_DEPARTMENT_OTHER): Payer: Medicare Other | Admitting: Pulmonary Disease

## 2022-03-06 ENCOUNTER — Encounter: Payer: Self-pay | Admitting: Internal Medicine

## 2022-03-06 ENCOUNTER — Telehealth: Payer: Self-pay

## 2022-03-06 ENCOUNTER — Encounter: Payer: Self-pay | Admitting: Cardiovascular Disease

## 2022-03-06 NOTE — Telephone Encounter (Signed)
I spoke with pt and went over his upcoming appts with him. After discussing with AT to make sure it was ok.   Per Dr. Aquilla Hacker note on 03/01/22 we were to cancel his 2/9 BiV Upgrade.  That was cancelled along with his Wound Check and 91 day with SK.   We scheduled him a f/u with SK on 3/12 @ 2:15.  We also cancelled his 2/14 f/u at the Afib clinic. It was a 1 mth post DCCV but the procedure was done on 2/2. He is scheduled to see Dr. Burt Knack on 2/19.

## 2022-03-06 NOTE — Telephone Encounter (Signed)
Pt's procedure has been cancelled and we made a f/u appt with Dr. Caryl Comes.

## 2022-03-07 ENCOUNTER — Ambulatory Visit (HOSPITAL_COMMUNITY)
Admission: RE | Admit: 2022-03-07 | Discharge: 2022-03-07 | Disposition: A | Discharge status: Home / Self Care | Payer: Medicare Other | Source: Ambulatory Visit | Attending: Cardiovascular Disease | Admitting: Cardiovascular Disease

## 2022-03-07 ENCOUNTER — Ambulatory Visit: Payer: Medicare Other | Admitting: Cardiovascular Disease

## 2022-03-07 DIAGNOSIS — I5023 Acute on chronic systolic (congestive) heart failure: Secondary | ICD-10-CM | POA: Insufficient documentation

## 2022-03-07 DIAGNOSIS — I081 Rheumatic disorders of both mitral and tricuspid valves: Secondary | ICD-10-CM | POA: Insufficient documentation

## 2022-03-07 DIAGNOSIS — Z8249 Family history of ischemic heart disease and other diseases of the circulatory system: Secondary | ICD-10-CM | POA: Insufficient documentation

## 2022-03-07 DIAGNOSIS — I493 Ventricular premature depolarization: Secondary | ICD-10-CM | POA: Diagnosis not present

## 2022-03-07 DIAGNOSIS — I2510 Atherosclerotic heart disease of native coronary artery without angina pectoris: Secondary | ICD-10-CM | POA: Insufficient documentation

## 2022-03-07 DIAGNOSIS — R600 Localized edema: Secondary | ICD-10-CM

## 2022-03-07 DIAGNOSIS — Z951 Presence of aortocoronary bypass graft: Secondary | ICD-10-CM | POA: Insufficient documentation

## 2022-03-07 LAB — ECHOCARDIOGRAM COMPLETE
Area-P 1/2: 2.86 cm2
Calc EF: 24.8 %
MV M vel: 3.37 m/s
MV Peak grad: 45.3 mmHg
MV VTI: 1.19 cm2
Radius: 0.4 cm
S' Lateral: 4.6 cm
Single Plane A2C EF: 21.5 %
Single Plane A4C EF: 29.1 %

## 2022-03-07 NOTE — Progress Notes (Signed)
Echocardiogram 2D Echocardiogram has been performed.  [name] 03/07/2022, 11:12 AM

## 2022-03-08 ENCOUNTER — Encounter (HOSPITAL_COMMUNITY): Payer: Self-pay | Admitting: Internal Medicine

## 2022-03-09 ENCOUNTER — Telehealth: Payer: Self-pay

## 2022-03-09 ENCOUNTER — Encounter (HOSPITAL_COMMUNITY): Payer: Self-pay | Admitting: *Deleted

## 2022-03-09 NOTE — Telephone Encounter (Signed)
Message sent to Dr. Caryl Comes advising remote available for review.

## 2022-03-09 NOTE — Telephone Encounter (Signed)
Dr. Caryl Comes asked me to get a transmission with the patient. Transmission received.

## 2022-03-09 NOTE — Telephone Encounter (Signed)
I spoke with the pt and he agreed to send a transmission .

## 2022-03-10 ENCOUNTER — Ambulatory Visit (HOSPITAL_COMMUNITY): Admission: RE | Admit: 2022-03-10 | Payer: Medicare Other | Source: Home / Self Care | Admitting: Internal Medicine

## 2022-03-10 ENCOUNTER — Encounter (HOSPITAL_COMMUNITY): Admission: RE | Payer: Self-pay | Source: Home / Self Care

## 2022-03-10 SURGERY — BIV UPGRADE

## 2022-03-13 ENCOUNTER — Encounter: Payer: Self-pay | Admitting: Internal Medicine

## 2022-03-14 ENCOUNTER — Ambulatory Visit: Payer: Medicare Other | Attending: Internal Medicine | Admitting: Internal Medicine

## 2022-03-14 ENCOUNTER — Encounter: Payer: Self-pay | Admitting: Internal Medicine

## 2022-03-14 ENCOUNTER — Other Ambulatory Visit (HOSPITAL_COMMUNITY): Payer: Medicare Other

## 2022-03-14 VITALS — BP 108/70 | HR 89 | Ht 73.0 in | Wt 188.0 lb

## 2022-03-14 DIAGNOSIS — I5022 Chronic systolic (congestive) heart failure: Secondary | ICD-10-CM | POA: Diagnosis not present

## 2022-03-14 DIAGNOSIS — Z95810 Presence of automatic (implantable) cardiac defibrillator: Secondary | ICD-10-CM | POA: Diagnosis not present

## 2022-03-14 DIAGNOSIS — I4819 Other persistent atrial fibrillation: Secondary | ICD-10-CM

## 2022-03-14 DIAGNOSIS — Z79899 Other long term (current) drug therapy: Secondary | ICD-10-CM

## 2022-03-14 DIAGNOSIS — I25119 Atherosclerotic heart disease of native coronary artery with unspecified angina pectoris: Secondary | ICD-10-CM

## 2022-03-14 DIAGNOSIS — I493 Ventricular premature depolarization: Secondary | ICD-10-CM | POA: Diagnosis not present

## 2022-03-14 NOTE — Progress Notes (Signed)
Patient Care Team: Sueanne Margarita, DO as PCP - General (Internal Medicine) Sherren Mocha, MD as PCP - Cardiology (Cardiology) Deboraha Sprang, MD as PCP - Electrophysiology (Cardiology) Pieter Partridge, DO as Consulting Physician (Neurology)   HPI  [name] is a 89 y.o. male Seen in followup for polymorphic ventricular tachycardia for which he has an ICD implantation 2004 with generator change 2010, 2016.  His device is reached ERI  Also persistent atrial fibrillation which was quite symptomatic and prompted a visit to the A. fib clinic with the plan for anticoagulation and TEE cardioversion.  Currently maintained on Eliquis and dofetilide  Last device implanted with CRT but no LV lead was placed.  History of coronary disease with prior CABG mitral valve repair both of which were done at [facility]  PFO closure at that time.     AFib assoc with slowish Ventricular response but with dyspnea on exertion and fatigue.  He is quite aware of when he is in A. fib and when he is not.  Interval issues include 1-none resolving right upper lobe pneumonia for which he underwent bronchoscopy 6/22 cytology negative 2-expanding abdominal aortic aneurysm--repair in the past of been recommended.  Phone call note 06/21/2020 referred to repeat vascular surgery clinic evaluation for which I do not see a note-- discussion regarding his choosing not to pursue surgical relief of his AAA,  he is at peace with the potential of his dying suddenly.  Recently developed decompensated HF aggressively diuresed and is improved. In discussions regarding his device and is whether to proceed with generator replacement, CRT upgrade.  His wife and he were here together as previously noted where there were some differences of opinion regarding how aggressively to proceed.  He comes in with his daughter today.  Feeling relatively good at rest.  Sleeping with 2 pillows without nocturnal dyspnea.  Trace peripheral  edema.  No significant lightheadedness.  No chest pain.  Extremely short of breath with mild activity.  Antiarrhythmics Date Reason stopped  dofetilide 9/20  ongoing   Amiodarone / Tried years ago following CABG stopped but pt does not recall adverse effects      DATE TEST EF   6/14 LHC 35-40% LIMA/ RIMA patent   2/17 Echo   30 %   12/18 LHC   LIMA-LAD and right radial graft-OM- patent proximal cx stenosis with negative FFR  11/19 Echo  35-40% LAE Severe (69/3.0/63) Pulm HTN 50  10/20 Echo  35-40% BAE  Pulm NTH 41 mm  4/22 Echo  35-40% BAE  PA sys 45  5/22 CTA-abd  AAA 6.2 <<5.7 cm        Date Cr K Mg Hgb Alk Phos   9/16  0.99 4.2 2.1     12/17  1.08 4.6 2.1     5/19 1.00 4.5 2.2     10/19 1.04 4.4  13.2 (11/19)    1/21  0.9 4.3 1.9 13.4    2/22 1.04 4.5 1.9 12.2<<13.9    6/22 0.93 4.8  14.1 11 1.6  8/23 0.98 5.0      1/24 1.5<,1.2    168 2.5       Device History: STJ dual chamber ICD implanted 2004 for PMVT, ICM; generator change 2010; gen change 2016 (MDT device) History of appropriate therapy: Yes History of AAD therapy: Yes Thromboembolic risk factors ( age  -2, HTN-1, TIA/CVA-2, Vasc disease -1, CHF-1) for a CHADSVASc Score of >=7   Past Medical History:  Diagnosis Date   Acute on chronic combined systolic (congestive) and diastolic (congestive) heart failure (HCC)    AICD (automatic cardioverter/defibrillator) present    Medtronicn PPM/ICD   Carotid stenosis, right    s/p endarterectomy 11/07/18   Chronic combined systolic (congestive) and diastolic (congestive) heart failure (HCC)    Coronary artery disease    [facility]   Diabetes mellitus    Dysrhythmia    Endocarditis, valve unspecified    Mitral   Headache    hx migraines 6-7 x mo   History of migraine headaches    History of seasonal allergies    ICD (implantable cardiac defibrillator) in place    Optic nerve hemorrhage, 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  We will monitor this closely and look for changes over the ensuing 6 months.  Repeat visit here in 6 months   PAF (paroxysmal atrial fibrillation) (HCC)    NO LAA occlusion at the time of surgery  [name] 2018   Pleural effusion    PVC's (premature ventricular contractions)    Ventricular tachycardia, polymorphic ([place])    status post ICD implantation    Past Surgical History:  Procedure Laterality Date   BRONCHIAL BIOPSY  07/22/2020   Procedure: BRONCHIAL BIOPSIES;  Surgeon: Collene Gobble, MD;  Location: [facility];  Service: Cardiopulmonary;;   BRONCHIAL BRUSHINGS  07/22/2020   Procedure: BRONCHIAL BRUSHINGS;  Surgeon: Collene Gobble, MD;  Location: [facility];  Service: Cardiopulmonary;;   BRONCHIAL WASHINGS  07/22/2020   Procedure: BRONCHIAL WASHINGS;  Surgeon: Collene Gobble, MD;  Location: [facility];  Service: Cardiopulmonary;;   CARDIAC CATHETERIZATION  04/03/2007   EF 40%   CARDIAC CATHETERIZATION  02/06/2006   EF 45%. ANTERIOR HYPOKINESIS   CARDIAC CATHETERIZATION  05/29/2000   EF 50%. MILD ANTERIOR HYPOKINESIS   CARDIAC CATHETERIZATION  10/25/1999   EF 50%. SEVERE MITRAL REGURGITATION   CARDIAC CATHETERIZATION  01/06/2003   EF 50%   CARDIAC DEFIBRILLATOR PLACEMENT     CARDIOVERSION N/A 02/07/2019   Procedure: CARDIOVERSION;  Surgeon: Fay Records, MD;  Location: [place];  Service: Cardiovascular;  Laterality: N/A;   CARDIOVERSION N/A 11/24/2019   Procedure: CARDIOVERSION;  Surgeon: Josue Hector, MD;  Location: [place];  Service: Cardiovascular;  Laterality: N/A;   CARDIOVERSION N/A 02/15/2022   Procedure: CARDIOVERSION;  Surgeon: Jerline Pain, MD;  Location: [facility] ENDOSCOPY;  Service: Cardiovascular;  Laterality: N/A;   CARDIOVERSION N/A 03/03/2022   Procedure: CARDIOVERSION;  Surgeon: Elouise Munroe, MD;  Location: [place];  Service: Cardiovascular;  Laterality: N/A;    CATARACT EXTRACTION W/ INTRAOCULAR LENS  IMPLANT, BILATERAL     CHOLECYSTECTOMY N/A 01/08/2019   Procedure: LAPAROSCOPIC CHOLECYSTECTOMY WITH INTRAOPERATIVE CHOLANGIOGRAM;  Surgeon: Donnie Mesa, MD;  Location: [place];  Service: General;  Laterality: N/A;   CORONARY ARTERY BYPASS GRAFT  2001   2 VESSEL CABG AND MITRAL VALVE REPAIR. HE HAD A LIMA GRAFT TO THE LAD AND RADIAL ARTERY  GRAFT TO THE OBTUSE MARGINAL  OF LEFT CIRCUMFLEX   ENDARTERECTOMY Right 11/07/2018   Procedure: ENDARTERECTOMY CAROTID RIGHT;  Surgeon: Waynetta Sandy, MD;  Location: [place];  Service: Vascular;  Laterality: Right;   EP IMPLANTABLE DEVICE N/A 11/06/2014   Procedure: ICD Generator Changeout;  Surgeon: Deboraha Sprang, MD;  Location: [place] [facility];  Service: Cardiovascular;  Laterality: N/A;  ERCP N/A 01/10/2019   Procedure: ENDOSCOPIC RETROGRADE CHOLANGIOPANCREATOGRAPHY (ERCP);  Surgeon: Clarene Essex, MD;  Location: [place];  Service: Endoscopy;  Laterality: N/A;   FOREIGN BODY REMOVAL Right 06/21/2017   Procedure: FOREIGN BODY REMOVAL ADULT RIGHT RING FINGER;  Surgeon: Leanora Cover, MD;  Location: [place];  Service: Orthopedics;  Laterality: Right;   HEMORRHOID SURGERY     HEMORROIDECTOMY     INTRAVASCULAR PRESSURE WIRE/FFR STUDY N/A 01/05/2017   Procedure: INTRAVASCULAR PRESSURE WIRE/FFR STUDY;  Surgeon: Sherren Mocha, MD;  Location: [place] [facility];  Service: Cardiovascular;  Laterality: N/A;   KNEE ARTHROSCOPY     RIGHT KNEE   LAPAROSCOPIC APPENDECTOMY N/A 01/08/2019   Procedure: APPENDECTOMY LAPAROSCOPIC;  Surgeon: Donnie Mesa, MD;  Location: [place];  Service: General;  Laterality: N/A;   LEFT HEART CATHETERIZATION WITH CORONARY ANGIOGRAM N/A 07/04/2012   Procedure: LEFT HEART CATHETERIZATION WITH CORONARY ANGIOGRAM;  Surgeon: Sherren Mocha, MD;  [facility] C[facility] LAB;  Service: Cardiovascular;  Laterality: N/A;   MITRAL VALVE REPLACEMENT     No LAA occlusion at the time of surgery  Karie Soda  report 2018   REMOVAL OF STONES  01/10/2019   Procedure: REMOVAL OF STONES;  Surgeon: Clarene Essex, MD;  Location: [place];  Service: Endoscopy;;   RIGHT/LEFT HEART CATH AND CORONARY/GRAFT ANGIOGRAPHY N/A 01/05/2017   Procedure: RIGHT/LEFT HEART CATH AND CORONARY/GRAFT ANGIOGRAPHY;  Surgeon: Sherren Mocha, MD;  Location: [place] [facility];  Service: Cardiovascular;  Laterality: N/A;   SPHINCTEROTOMY  01/10/2019   Procedure: SPHINCTEROTOMY;  Surgeon: Clarene Essex, MD;  Location: [place];  Service: Endoscopy;;   TRANSTHORACIC ECHOCARDIOGRAM  04/02/2007   EF 35%   VIDEO BRONCHOSCOPY Right 07/22/2020   Procedure: VIDEO BRONCHOSCOPY WITH FLUORO, BAL AND BIOPSY;  Surgeon: Collene Gobble, MD;  Location: [facility];  Service: Cardiopulmonary;  Laterality: Right;  CONSCIOUS SEDATION OR MAC    Current Outpatient Medications  Medication Sig Dispense Refill   albuterol (VENTOLIN HFA) 108 (90 Base) MCG/ACT inhaler Inhale 2 puffs into the lungs every 6 (six) hours as needed for wheezing or shortness of breath. 8 g 5   atorvastatin (LIPITOR) 80 MG tablet TAKE ONE TABLET BY MOUTH EVERY DAY FOR CHOLESTEROL (Patient taking differently: Take 80 mg by mouth at bedtime.) 90 tablet 3   ELIQUIS 5 MG TABS tablet TAKE ONE TABLET BY MOUTH TWICE DAILY 180 tablet 1   empagliflozin (JARDIANCE) 10 MG TABS tablet Take 10 mg by mouth in the morning.     EPIPEN 2-PAK 0.3 MG/0.3ML SOAJ injection Inject 0.3 mg into the muscle daily as needed for anaphylaxis.      Erenumab-aooe (AIMOVIG) 140 MG/ML SOAJ Inject 140 mg into the skin every 28 (twenty-eight) days. 1.12 mL 11   fluticasone (FLONASE) 50 MCG/ACT nasal spray Place 1 spray into the nose daily as needed for rhinitis or allergies.     ibuprofen (ADVIL) 200 MG tablet Take 400 mg by mouth every 8 (eight) hours as needed (headaches/migraine).     Magnesium 250 MG TABS Take 250 mg by mouth in the morning.     metoprolol succinate (TOPROL-XL) 25 MG 24 hr tablet TAKE  ONE TABLET BY MOUTH TWICE DAILY WITH OR IMMEDIATELY FOLLOWING A MEAL 180 tablet 3   Multiple Vitamin (MULTIVITAMIN WITH MINERALS) TABS tablet Take 1 tablet by mouth at bedtime. Centrum Silver     Multiple Vitamins-Minerals (PRESERVISION AREDS 2) CAPS Take 1 capsule by mouth as needed.     nateglinide (STARLIX) 60  MG tablet Take 60 mg by mouth 2 (two) times daily with a meal.     ONETOUCH VERIO test strip SMARTSIG:Via Meter 1-2 Times Daily     sitaGLIPtin (JANUVIA) 100 MG tablet Take 100 mg by mouth at bedtime.     Tiotropium Bromide-Olodaterol (STIOLTO RESPIMAT) 2.5-2.5 MCG/ACT AERS Inhale 2 puffs into the lungs at bedtime. (Patient taking differently: Inhale 2 puffs into the lungs every morning.)     torsemide (DEMADEX) 20 MG tablet Take 2 tablets (14m) by mouth twice daily for 3 days, then decrease to 2 tablets (475m once daily thereafter (Patient taking differently: Take 2 tablets (4054mby mouth twice daily for 3 days, then decrease to 2 tablets (89m22mnce daily thereafter. **PATIENT IS TAKING 20 MG DAILY) 180 tablet 3   amiodarone (PACERONE) 200 MG tablet Take 2 tablets (400 mg total) by mouth daily. 180 tablet 3   No current facility-administered medications for this visit.    Allergies  Allergen Reactions   Bee Venom Anaphylaxis   Latex Other (See Comments)    REDNESS AT REACTION SITE.   Metformin Diarrhea   Rivaroxaban Other (See Comments)    Headaches, blurred vision   Sotalol Other (See Comments)    "BLUE TOES"- cut his circulation off   Levofloxacin     Other reaction(s): long QT syndrome, so avoid   Warfarin Sodium Other (See Comments)    REACTION: migraine headaches/vision impariment    Review of Systems negative except from HPI and PMH  Physical Exam  BP 108/70   Pulse 89   Ht 6' 1"$  (1.854 m)   Wt 188 lb (85.3 kg)   SpO2 99%   BMI 24.80 kg/m  Well developed and well nourished in no acute distress HENT normal Neck supple with JVP-flat Clear Device pocket  well healed; without hematoma or erythema.  There is no tethering  Regular rate and rhythm, no  murmur Abd-soft with active BS No Clubbing cyanosis  edema Skin-warm and dry A & Oriented  Grossly normal sensory and motor function  ECG atrial fibrillation with ventricular pacing at 84 Intervals-/23/51  Device function is normal. Programming changes none  See Paceart for details       Assessment and  Plan   Ischemic heart disease with prior bypass ejection 35-40%  Congestive heart failure-chronic-systolic  Implantable defibrillator-Medtronic dual chamber with LV port plugged.   PVCs//Ventricular tachycardia nonsustained     Atrial fibrillation persistent/recurrent  COPD-reactive airways disease   Lengthy discussion again regarding device generator replacement in light of the conversation previously dictated.  The issue is whether CRT upgrade can interrupt the downward trajectory of his heart failure; I think the reversal of his heart failure status is accomplished by vigorous diuresis over the last few weeks suggests that he is at least stable for device implant.  To the degree that this is pacemaker mediated we have more reasons to be sanguine that if it is simply progressive heart failure.  But even if that were the latter, apart from his age, I would not be reluctant to attempt CRT upgrade (with a left bundle branch area pacing bailout)  His ICD lead is a DF 1, we would The high-voltage lead.  We could potentially The atrial lead but would be more likely to just deployed into the atrial port of a CRT-P device  We have previously talked about the risks, postprocedural as well as post implant infection.  We would use an antimicrobial pouch.  His labs are  notable for an interval worsening in renal function and some degree of hyperbilirubinemia which is chronic.  We will repeat his labs today, he is to be seeing Dr. Burt Knack on Monday with a family conference in mind.  At this  point, however, the patient is quite clear that he would like to proceed with an opportunity of CRT upgrade given the fact that his device is at [facility] anyway.

## 2022-03-14 NOTE — H&P (View-Only) (Signed)
Patient Care Team: Sueanne Margarita, DO as PCP - General (Internal Medicine) Sherren Mocha, MD as PCP - Cardiology (Cardiology) Deboraha Sprang, MD as PCP - Electrophysiology (Cardiology) Pieter Partridge, DO as Consulting Physician (Neurology)   HPI  [name] is a 89 y.o. male Seen in followup for polymorphic ventricular tachycardia for which he has an ICD implantation 2004 with generator change 2010, 2016.  His device is reached ERI  Also persistent atrial fibrillation which was quite symptomatic and prompted a visit to the A. fib clinic with the plan for anticoagulation and TEE cardioversion.  Currently maintained on Eliquis and dofetilide  Last device implanted with CRT but no LV lead was placed.  History of coronary disease with prior CABG mitral valve repair both of which were done at [facility]  PFO closure at that time.     AFib assoc with slowish Ventricular response but with dyspnea on exertion and fatigue.  He is quite aware of when he is in A. fib and when he is not.  Interval issues include 1-none resolving right upper lobe pneumonia for which he underwent bronchoscopy 6/22 cytology negative 2-expanding abdominal aortic aneurysm--repair in the past of been recommended.  Phone call note 06/21/2020 referred to repeat vascular surgery clinic evaluation for which I do not see a note-- discussion regarding his choosing not to pursue surgical relief of his AAA,  he is at peace with the potential of his dying suddenly.  Recently developed decompensated HF aggressively diuresed and is improved. In discussions regarding his device and is whether to proceed with generator replacement, CRT upgrade.  His wife and he were here together as previously noted where there were some differences of opinion regarding how aggressively to proceed.  He comes in with his daughter today.  Feeling relatively good at rest.  Sleeping with 2 pillows without nocturnal dyspnea.  Trace peripheral  edema.  No significant lightheadedness.  No chest pain.  Extremely short of breath with mild activity.  Antiarrhythmics Date Reason stopped  dofetilide 9/20  ongoing   Amiodarone / Tried years ago following CABG stopped but pt does not recall adverse effects      DATE TEST EF   6/14 LHC 35-40% LIMA/ RIMA patent   2/17 Echo   30 %   12/18 LHC   LIMA-LAD and right radial graft-OM- patent proximal cx stenosis with negative FFR  11/19 Echo  35-40% LAE Severe (69/3.0/63) Pulm HTN 50  10/20 Echo  35-40% BAE  Pulm NTH 41 mm  4/22 Echo  35-40% BAE  PA sys 45  5/22 CTA-abd  AAA 6.2 <<5.7 cm        Date Cr K Mg Hgb Alk Phos   9/16  0.99 4.2 2.1     12/17  1.08 4.6 2.1     5/19 1.00 4.5 2.2     10/19 1.04 4.4  13.2 (11/19)    1/21  0.9 4.3 1.9 13.4    2/22 1.04 4.5 1.9 12.2<<13.9    6/22 0.93 4.8  14.1 11 1.6  8/23 0.98 5.0      1/24 1.5<,1.2    168 2.5       Device History: STJ dual chamber ICD implanted 2004 for PMVT, ICM; generator change 2010; gen change 2016 (MDT device) History of appropriate therapy: Yes History of AAD therapy: Yes Thromboembolic risk factors ( age  -2, HTN-1, TIA/CVA-2, Vasc disease -1, CHF-1) for a CHADSVASc Score of >=7   Past Medical History:  Diagnosis Date   Acute on chronic combined systolic (congestive) and diastolic (congestive) heart failure (HCC)    AICD (automatic cardioverter/defibrillator) present    Medtronicn PPM/ICD   Carotid stenosis, right    s/p endarterectomy 11/07/18   Chronic combined systolic (congestive) and diastolic (congestive) heart failure (HCC)    Coronary artery disease    [facility]   Diabetes mellitus    Dysrhythmia    Endocarditis, valve unspecified    Mitral   Headache    hx migraines 6-7 x mo   History of migraine headaches    History of seasonal allergies    ICD (implantable cardiac defibrillator) in place    Optic nerve hemorrhage, 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  We will monitor this closely and look for changes over the ensuing 6 months.  Repeat visit here in 6 months   PAF (paroxysmal atrial fibrillation) (HCC)    NO LAA occlusion at the time of surgery  [name] 2018   Pleural effusion    PVC's (premature ventricular contractions)    Ventricular tachycardia, polymorphic ([place])    status post ICD implantation    Past Surgical History:  Procedure Laterality Date   BRONCHIAL BIOPSY  07/22/2020   Procedure: BRONCHIAL BIOPSIES;  Surgeon: Collene Gobble, MD;  Location: [facility];  Service: Cardiopulmonary;;   BRONCHIAL BRUSHINGS  07/22/2020   Procedure: BRONCHIAL BRUSHINGS;  Surgeon: Collene Gobble, MD;  Location: [facility];  Service: Cardiopulmonary;;   BRONCHIAL WASHINGS  07/22/2020   Procedure: BRONCHIAL WASHINGS;  Surgeon: Collene Gobble, MD;  Location: [facility];  Service: Cardiopulmonary;;   CARDIAC CATHETERIZATION  04/03/2007   EF 40%   CARDIAC CATHETERIZATION  02/06/2006   EF 45%. ANTERIOR HYPOKINESIS   CARDIAC CATHETERIZATION  05/29/2000   EF 50%. MILD ANTERIOR HYPOKINESIS   CARDIAC CATHETERIZATION  10/25/1999   EF 50%. SEVERE MITRAL REGURGITATION   CARDIAC CATHETERIZATION  01/06/2003   EF 50%   CARDIAC DEFIBRILLATOR PLACEMENT     CARDIOVERSION N/A 02/07/2019   Procedure: CARDIOVERSION;  Surgeon: Fay Records, MD;  Location: [place];  Service: Cardiovascular;  Laterality: N/A;   CARDIOVERSION N/A 11/24/2019   Procedure: CARDIOVERSION;  Surgeon: Josue Hector, MD;  Location: [place];  Service: Cardiovascular;  Laterality: N/A;   CARDIOVERSION N/A 02/15/2022   Procedure: CARDIOVERSION;  Surgeon: Jerline Pain, MD;  Location: [facility] ENDOSCOPY;  Service: Cardiovascular;  Laterality: N/A;   CARDIOVERSION N/A 03/03/2022   Procedure: CARDIOVERSION;  Surgeon: Elouise Munroe, MD;  Location: [place];  Service: Cardiovascular;  Laterality: N/A;    CATARACT EXTRACTION W/ INTRAOCULAR LENS  IMPLANT, BILATERAL     CHOLECYSTECTOMY N/A 01/08/2019   Procedure: LAPAROSCOPIC CHOLECYSTECTOMY WITH INTRAOPERATIVE CHOLANGIOGRAM;  Surgeon: Donnie Mesa, MD;  Location: [place];  Service: General;  Laterality: N/A;   CORONARY ARTERY BYPASS GRAFT  2001   2 VESSEL CABG AND MITRAL VALVE REPAIR. HE HAD A LIMA GRAFT TO THE LAD AND RADIAL ARTERY  GRAFT TO THE OBTUSE MARGINAL  OF LEFT CIRCUMFLEX   ENDARTERECTOMY Right 11/07/2018   Procedure: ENDARTERECTOMY CAROTID RIGHT;  Surgeon: Waynetta Sandy, MD;  Location: [place];  Service: Vascular;  Laterality: Right;   EP IMPLANTABLE DEVICE N/A 11/06/2014   Procedure: ICD Generator Changeout;  Surgeon: Deboraha Sprang, MD;  Location: [place] CV [facility];  Service: Cardiovascular;  Laterality: N/A;  ERCP N/A 01/10/2019   Procedure: ENDOSCOPIC RETROGRADE CHOLANGIOPANCREATOGRAPHY (ERCP);  Surgeon: Clarene Essex, MD;  Location: [place];  Service: Endoscopy;  Laterality: N/A;   FOREIGN BODY REMOVAL Right 06/21/2017   Procedure: FOREIGN BODY REMOVAL ADULT RIGHT RING FINGER;  Surgeon: Leanora Cover, MD;  Location: [place];  Service: Orthopedics;  Laterality: Right;   HEMORRHOID SURGERY     HEMORROIDECTOMY     INTRAVASCULAR PRESSURE WIRE/FFR STUDY N/A 01/05/2017   Procedure: INTRAVASCULAR PRESSURE WIRE/FFR STUDY;  Surgeon: Sherren Mocha, MD;  Location: [place] [facility];  Service: Cardiovascular;  Laterality: N/A;   KNEE ARTHROSCOPY     RIGHT KNEE   LAPAROSCOPIC APPENDECTOMY N/A 01/08/2019   Procedure: APPENDECTOMY LAPAROSCOPIC;  Surgeon: Donnie Mesa, MD;  Location: [place];  Service: General;  Laterality: N/A;   LEFT HEART CATHETERIZATION WITH CORONARY ANGIOGRAM N/A 07/04/2012   Procedure: LEFT HEART CATHETERIZATION WITH CORONARY ANGIOGRAM;  Surgeon: Sherren Mocha, MD;  Location: [facility] CATH LAB;  Service: Cardiovascular;  Laterality: N/A;   MITRAL VALVE REPLACEMENT     No LAA occlusion at the time of surgery  Karie Soda  report 2018   REMOVAL OF STONES  01/10/2019   Procedure: REMOVAL OF STONES;  Surgeon: Clarene Essex, MD;  Location: [place];  Service: Endoscopy;;   RIGHT/LEFT HEART CATH AND CORONARY/GRAFT ANGIOGRAPHY N/A 01/05/2017   Procedure: RIGHT/LEFT HEART CATH AND CORONARY/GRAFT ANGIOGRAPHY;  Surgeon: Sherren Mocha, MD;  Location: [place] CV LAB;  Service: Cardiovascular;  Laterality: N/A;   SPHINCTEROTOMY  01/10/2019   Procedure: SPHINCTEROTOMY;  Surgeon: Clarene Essex, MD;  Location: [place];  Service: Endoscopy;;   TRANSTHORACIC ECHOCARDIOGRAM  04/02/2007   EF 35%   VIDEO BRONCHOSCOPY Right 07/22/2020   Procedure: VIDEO BRONCHOSCOPY WITH FLUORO, BAL AND BIOPSY;  Surgeon: Collene Gobble, MD;  Location: [facility];  Service: Cardiopulmonary;  Laterality: Right;  CONSCIOUS SEDATION OR MAC    Current Outpatient Medications  Medication Sig Dispense Refill   albuterol (VENTOLIN HFA) 108 (90 Base) MCG/ACT inhaler Inhale 2 puffs into the lungs every 6 (six) hours as needed for wheezing or shortness of breath. 8 g 5   atorvastatin (LIPITOR) 80 MG tablet TAKE ONE TABLET BY MOUTH EVERY DAY FOR CHOLESTEROL (Patient taking differently: Take 80 mg by mouth at bedtime.) 90 tablet 3   ELIQUIS 5 MG TABS tablet TAKE ONE TABLET BY MOUTH TWICE DAILY 180 tablet 1   empagliflozin (JARDIANCE) 10 MG TABS tablet Take 10 mg by mouth in the morning.     EPIPEN 2-PAK 0.3 MG/0.3ML SOAJ injection Inject 0.3 mg into the muscle daily as needed for anaphylaxis.      Erenumab-aooe (AIMOVIG) 140 MG/ML SOAJ Inject 140 mg into the skin every 28 (twenty-eight) days. 1.12 mL 11   fluticasone (FLONASE) 50 MCG/ACT nasal spray Place 1 spray into the nose daily as needed for rhinitis or allergies.     ibuprofen (ADVIL) 200 MG tablet Take 400 mg by mouth every 8 (eight) hours as needed (headaches/migraine).     Magnesium 250 MG TABS Take 250 mg by mouth in the morning.     metoprolol succinate (TOPROL-XL) 25 MG 24 hr tablet TAKE  ONE TABLET BY MOUTH TWICE DAILY WITH OR IMMEDIATELY FOLLOWING A MEAL 180 tablet 3   Multiple Vitamin (MULTIVITAMIN WITH MINERALS) TABS tablet Take 1 tablet by mouth at bedtime. Centrum Silver     Multiple Vitamins-Minerals (PRESERVISION AREDS 2) CAPS Take 1 capsule by mouth as needed.     nateglinide (STARLIX) 60  MG tablet Take 60 mg by mouth 2 (two) times daily with a meal.     ONETOUCH VERIO test strip SMARTSIG:Via Meter 1-2 Times Daily     sitaGLIPtin (JANUVIA) 100 MG tablet Take 100 mg by mouth at bedtime.     Tiotropium Bromide-Olodaterol (STIOLTO RESPIMAT) 2.5-2.5 MCG/ACT AERS Inhale 2 puffs into the lungs at bedtime. (Patient taking differently: Inhale 2 puffs into the lungs every morning.)     torsemide (DEMADEX) 20 MG tablet Take 2 tablets (32m) by mouth twice daily for 3 days, then decrease to 2 tablets (475m once daily thereafter (Patient taking differently: Take 2 tablets (4060mby mouth twice daily for 3 days, then decrease to 2 tablets (25m79mnce daily thereafter. **PATIENT IS TAKING 20 MG DAILY) 180 tablet 3   amiodarone (PACERONE) 200 MG tablet Take 2 tablets (400 mg total) by mouth daily. 180 tablet 3   No current facility-administered medications for this visit.    Allergies  Allergen Reactions   Bee Venom Anaphylaxis   Latex Other (See Comments)    REDNESS AT REACTION SITE.   Metformin Diarrhea   Rivaroxaban Other (See Comments)    Headaches, blurred vision   Sotalol Other (See Comments)    "BLUE TOES"- cut his circulation off   Levofloxacin     Other reaction(s): long QT syndrome, so avoid   Warfarin Sodium Other (See Comments)    REACTION: migraine headaches/vision impariment    Review of Systems negative except from HPI and PMH  Physical Exam  BP 108/70   Pulse 89   Ht 6' 1"$  (1.854 m)   Wt 188 lb (85.3 kg)   SpO2 99%   BMI 24.80 kg/m  Well developed and well nourished in no acute distress HENT normal Neck supple with JVP-flat Clear Device pocket  well healed; without hematoma or erythema.  There is no tethering  Regular rate and rhythm, no  murmur Abd-soft with active BS No Clubbing cyanosis  edema Skin-warm and dry A & Oriented  Grossly normal sensory and motor function  ECG atrial fibrillation with ventricular pacing at 84 Intervals-/23/51  Device function is normal. Programming changes none  See Paceart for details       Assessment and  Plan   Ischemic heart disease with prior bypass ejection 35-40%  Congestive heart failure-chronic-systolic  Implantable defibrillator-Medtronic dual chamber with LV port plugged.   PVCs//Ventricular tachycardia nonsustained     Atrial fibrillation persistent/recurrent  COPD-reactive airways disease   Lengthy discussion again regarding device generator replacement in light of the conversation previously dictated.  The issue is whether CRT upgrade can interrupt the downward trajectory of his heart failure; I think the reversal of his heart failure status is accomplished by vigorous diuresis over the last few weeks suggests that he is at least stable for device implant.  To the degree that this is pacemaker mediated we have more reasons to be sanguine that if it is simply progressive heart failure.  But even if that were the latter, apart from his age, I would not be reluctant to attempt CRT upgrade (with a left bundle branch area pacing bailout)  His ICD lead is a DF 1, we would The high-voltage lead.  We could potentially The atrial lead but would be more likely to just deployed into the atrial port of a CRT-P device  We have previously talked about the risks, postprocedural as well as post implant infection.  We would use an antimicrobial pouch.  His labs are  notable for an interval worsening in renal function and some degree of hyperbilirubinemia which is chronic.  We will repeat his labs today, he is to be seeing Dr. Burt Knack on Monday with a family conference in mind.  At this  point, however, the patient is quite clear that he would like to proceed with an opportunity of CRT upgrade given the fact that his device is at [facility] anyway.

## 2022-03-14 NOTE — Patient Instructions (Signed)
Medication Instructions:  Your physician recommends that you continue on your current medications as directed. Please refer to the Current Medication list given to you today.  *If you need a refill on your cardiac medications before your next appointment, please call your pharmacy*   Lab Work: CMET today If you have labs (blood work) drawn today and your tests are completely normal, you will receive your results only by: [place] (if you have MyChart) OR A paper copy in the mail If you have any lab test that is abnormal or we need to change your treatment, we will call you to review the results.   Testing/Procedures: None ordered.    Follow-Up: At [facility], you and your health needs are our priority.  As part of our continuing mission to provide you with exceptional heart care, we have created designated Provider Care Teams.  These Care Teams include your primary Cardiologist (physician) and Advanced Practice Providers (APPs -  Physician Assistants and Nurse Practitioners) who all work together to provide you with the care you need, when you need it.  We recommend signing up for the patient portal called "MyChart".  Sign up information is provided on this After Visit Summary.  MyChart is used to connect with patients for Virtual Visits (Telemedicine).  Patients are able to view lab/test results, encounter notes, upcoming appointments, etc.  Non-urgent messages can be sent to your provider as well.   To learn more about what you can do with MyChart, go to [email or web address].    Your next appointment:   To be scheduled

## 2022-03-15 ENCOUNTER — Ambulatory Visit (HOSPITAL_COMMUNITY): Payer: Medicare Other | Admitting: Nurse Practitioner

## 2022-03-15 LAB — COMPREHENSIVE METABOLIC PANEL
ALT: 24 IU/L (ref 0–44)
AST: 34 IU/L (ref 0–40)
Albumin/Globulin Ratio: 1.6 (ref 1.2–2.2)
Albumin: 4.5 g/dL (ref 3.7–4.7)
Alkaline Phosphatase: 183 IU/L — ABNORMAL HIGH (ref 44–121)
BUN/Creatinine Ratio: 20 (ref 10–24)
BUN: 27 mg/dL (ref 8–27)
Bilirubin Total: 3 mg/dL — ABNORMAL HIGH (ref 0.0–1.2)
CO2: 24 mmol/L (ref 20–29)
Calcium: 9.2 mg/dL (ref 8.6–10.2)
Chloride: 96 mmol/L (ref 96–106)
Creatinine, Ser: 1.37 mg/dL — ABNORMAL HIGH (ref 0.76–1.27)
Globulin, Total: 2.9 g/dL (ref 1.5–4.5)
Glucose: 178 mg/dL — ABNORMAL HIGH (ref 70–99)
Potassium: 4.3 mmol/L (ref 3.5–5.2)
Sodium: 138 mmol/L (ref 134–144)
Total Protein: 7.4 g/dL (ref 6.0–8.5)
eGFR: 50 mL/min/{1.73_m2} — ABNORMAL LOW (ref >59)

## 2022-03-20 ENCOUNTER — Ambulatory Visit: Payer: Medicare Other | Admitting: Cardiovascular Disease

## 2022-03-21 NOTE — Pre-Procedure Instructions (Signed)
Instructed patient on the following items: Arrival time 1100 Nothing to eat or drink after midnight No meds AM of procedure Responsible person to drive you home and stay with you for 24 hrs Wash with special soap night before and morning of procedure If on anti-coagulant drug instructions Eliquis- last dose 2/19

## 2022-03-22 ENCOUNTER — Other Ambulatory Visit: Payer: Self-pay

## 2022-03-22 ENCOUNTER — Encounter (HOSPITAL_COMMUNITY)
Admission: RE | Disposition: A | Discharge status: Home / Self Care | Payer: Self-pay | Source: Home / Self Care | Attending: Internal Medicine

## 2022-03-22 ENCOUNTER — Ambulatory Visit: Payer: Medicare Other

## 2022-03-22 ENCOUNTER — Ambulatory Visit (HOSPITAL_COMMUNITY): Payer: Medicare Other

## 2022-03-22 ENCOUNTER — Ambulatory Visit (HOSPITAL_COMMUNITY)
Admission: RE | Admit: 2022-03-22 | Discharge: 2022-03-22 | Disposition: A | Discharge status: Home / Self Care | Payer: Medicare Other | Attending: Internal Medicine | Admitting: Internal Medicine

## 2022-03-22 DIAGNOSIS — I509 Heart failure, unspecified: Secondary | ICD-10-CM | POA: Diagnosis not present

## 2022-03-22 DIAGNOSIS — I2510 Atherosclerotic heart disease of native coronary artery without angina pectoris: Secondary | ICD-10-CM | POA: Diagnosis not present

## 2022-03-22 DIAGNOSIS — Z950 Presence of cardiac pacemaker: Secondary | ICD-10-CM | POA: Diagnosis not present

## 2022-03-22 DIAGNOSIS — J449 Chronic obstructive pulmonary disease, unspecified: Secondary | ICD-10-CM | POA: Insufficient documentation

## 2022-03-22 DIAGNOSIS — I429 Cardiomyopathy, unspecified: Secondary | ICD-10-CM | POA: Insufficient documentation

## 2022-03-22 DIAGNOSIS — Z7984 Long term (current) use of oral hypoglycemic drugs: Secondary | ICD-10-CM | POA: Insufficient documentation

## 2022-03-22 DIAGNOSIS — Z7901 Long term (current) use of anticoagulants: Secondary | ICD-10-CM | POA: Diagnosis not present

## 2022-03-22 DIAGNOSIS — Z79899 Other long term (current) drug therapy: Secondary | ICD-10-CM | POA: Diagnosis not present

## 2022-03-22 DIAGNOSIS — I5042 Chronic combined systolic (congestive) and diastolic (congestive) heart failure: Secondary | ICD-10-CM | POA: Diagnosis not present

## 2022-03-22 DIAGNOSIS — I442 Atrioventricular block, complete: Secondary | ICD-10-CM | POA: Diagnosis not present

## 2022-03-22 DIAGNOSIS — I472 Ventricular tachycardia, unspecified: Secondary | ICD-10-CM | POA: Insufficient documentation

## 2022-03-22 DIAGNOSIS — Z951 Presence of aortocoronary bypass graft: Secondary | ICD-10-CM | POA: Diagnosis not present

## 2022-03-22 DIAGNOSIS — I4819 Other persistent atrial fibrillation: Secondary | ICD-10-CM | POA: Insufficient documentation

## 2022-03-22 DIAGNOSIS — Z95810 Presence of automatic (implantable) cardiac defibrillator: Secondary | ICD-10-CM

## 2022-03-22 HISTORY — PX: BIV UPGRADE: EP1202

## 2022-03-22 SURGERY — BIV UPGRADE

## 2022-03-22 MED ORDER — SODIUM CHLORIDE 0.9 % IV SOLN
80.0000 mg | INTRAVENOUS | Status: AC
  Administered 2022-03-22: 80 mg

## 2022-03-22 MED ORDER — FENTANYL CITRATE (PF) 100 MCG/2ML IJ SOLN
INTRAMUSCULAR | Status: DC | PRN
  Administered 2022-03-22 (×2): 25 ug via INTRAVENOUS

## 2022-03-22 MED ORDER — SODIUM CHLORIDE 0.9 % IV SOLN: INTRAVENOUS | Status: DC

## 2022-03-22 MED ORDER — CEFAZOLIN SODIUM-DEXTROSE 2-4 GM/100ML-% IV SOLN
2.0000 g | INTRAVENOUS | Status: AC
  Administered 2022-03-22: 2 g via INTRAVENOUS

## 2022-03-22 MED ORDER — IOHEXOL 350 MG/ML SOLN
INTRAVENOUS | Status: DC | PRN
  Administered 2022-03-22: 15 mL

## 2022-03-22 MED ORDER — FENTANYL CITRATE (PF) 100 MCG/2ML IJ SOLN
INTRAMUSCULAR | Status: AC
  Filled 2022-03-22: qty 2

## 2022-03-22 MED ORDER — CEFAZOLIN SODIUM-DEXTROSE 2-4 GM/100ML-% IV SOLN
INTRAVENOUS | Status: AC
  Filled 2022-03-22: qty 100

## 2022-03-22 MED ORDER — LIDOCAINE HCL 1 % IJ SOLN
INTRAMUSCULAR | Status: AC
  Filled 2022-03-22: qty 60

## 2022-03-22 MED ORDER — MIDAZOLAM HCL 5 MG/5ML IJ SOLN
INTRAMUSCULAR | Status: DC | PRN
  Administered 2022-03-22 (×2): 1 mg via INTRAVENOUS

## 2022-03-22 MED ORDER — MIDAZOLAM HCL 5 MG/5ML IJ SOLN
INTRAMUSCULAR | Status: AC
  Filled 2022-03-22: qty 5

## 2022-03-22 MED ORDER — SODIUM CHLORIDE 0.9 % IV SOLN
INTRAVENOUS | Status: AC
  Filled 2022-03-22: qty 2

## 2022-03-22 MED ORDER — ACETAMINOPHEN 325 MG PO TABS: 325.0000 mg | ORAL_TABLET | ORAL | Status: DC | PRN

## 2022-03-22 MED ORDER — LIDOCAINE HCL (PF) 1 % IJ SOLN
INTRAMUSCULAR | Status: DC | PRN
  Administered 2022-03-22: 55 mL

## 2022-03-22 MED ORDER — CHLORHEXIDINE GLUCONATE 4 % EX LIQD
4.0000 | Freq: Once | CUTANEOUS | Status: DC
  Filled 2022-03-22: qty 60

## 2022-03-22 SURGICAL SUPPLY — 17 items
CABLE SURGICAL S-101-97-12 (CABLE) ×2 IMPLANT
CATH CPS DIRECT 135 DS2C020 (CATHETERS) IMPLANT
DEVICE CRTP PERCEPTA QUAD MRI (Pacemaker) IMPLANT
DEVICE DISSECT PLASMABLAD 3.0S (MISCELLANEOUS) IMPLANT
HEMOSTAT SURGICEL 2X4 FIBR (HEMOSTASIS) IMPLANT
KIT MICROPUNCTURE NIT STIFF (SHEATH) IMPLANT
LEAD QUARTET 1456Q-86 (Lead) IMPLANT
PAD DEFIB RADIO PHYSIO CONN (PAD) ×2 IMPLANT
PLASMABLADE 3.0S (MISCELLANEOUS) ×1
POUCH AIGIS-R ANTIBACT PPM (Mesh General) ×1 IMPLANT
POUCH AIGIS-R ANTIBACT PPM MED (Mesh General) IMPLANT
QUARTET 1456Q-86 (Lead) ×1 IMPLANT
SHEATH 9.5FR PRELUDE SNAP 13 (SHEATH) IMPLANT
SLITTER AGILIS HISPRO (INSTRUMENTS) IMPLANT
TRAY PACEMAKER INSERTION (PACKS) ×2 IMPLANT
WIRE ACUITY WHISPER EDS 4648 (WIRE) IMPLANT
WIRE HI TORQ VERSACORE-J 145CM (WIRE) IMPLANT

## 2022-03-22 NOTE — Interval H&P Note (Signed)
History and Physical Interval Note:  03/22/2022 11:48 AM  [name]  has presented today for surgery, with the diagnosis of cardiomyopathy.  The various methods of treatment have been discussed with the patient and family. After consideration of risks, benefits and other options for treatment, the patient has consented to  Procedure(s): BIV UPGRADE (N/A) as a surgical intervention.  The patient's history has been reviewed, patient examined, no change in status, stable for surgery.  I have reviewed the patient's chart and labs.  Questions were answered to the patient's satisfaction.     [name]

## 2022-03-22 NOTE — Discharge Instructions (Addendum)
After Your Pacemaker   You have a Medtronic Pacemaker  ACTIVITY Do not lift your arm above shoulder height for 1 week after your procedure. After 7 days, you may progress as below.  You should remove your sling 24 hours after your procedure, unless otherwise instructed by your provider.     Wednesday March 29, 2022  Thursday March 30, 2022 Friday March 31, 2022 Saturday April 01, 2022   Do not lift, push, pull, or carry anything over 10 pounds with the affected arm until 6 weeks (Wednesday May 03, 2022 ) after your procedure.   You may drive AFTER your wound check, unless you have been told otherwise by your provider.   Ask your healthcare provider when you can go back to work   INCISION/Dressing If you are on a blood thinner such as Coumadin, Xarelto, Eliquis, Plavix, or Pradaxa please confirm with your provider when this should be resumed.  If large square, outer bandage is left in place, this can be removed after 24 hours from your procedure. Do not remove steri-strips or glue as below.   Monitor your Pacemaker site for redness, swelling, and drainage. Call the device clinic at [phone] if you experience these symptoms or fever/chills.  If your incision is sealed with Steri-strips or staples, you may shower 7 days after your procedure or when told by your provider. Do not remove the steri-strips or let the shower hit directly on your site. You may wash around your site with soap and water.    If you were discharged in a sling, please do not wear this during the day more than 48 hours after your surgery unless otherwise instructed. This may increase the risk of stiffness and soreness in your shoulder.   Avoid lotions, ointments, or perfumes over your incision until it is well-healed.  You may use a hot tub or a pool AFTER your wound check appointment if the incision is completely closed.  Pacemaker Alerts:  Some alerts are vibratory and others beep. These are NOT  emergencies. Please call our office to let us know. If this occurs at night or on weekends, it can wait until the next business day. Send a remote transmission.  If your device is capable of reading fluid status (for heart failure), you will be offered monthly monitoring to review this with you.   DEVICE MANAGEMENT Remote monitoring is used to monitor your pacemaker from home. This monitoring is scheduled every 91 days by our office. It allows [name] to keep an eye on the functioning of your device to ensure it is working properly. You will routinely see your Electrophysiologist annually (more often if necessary).   You should receive your ID card for your new device in 4-8 weeks. Keep this card with you at all times once received. Consider wearing a medical alert bracelet or necklace.  Your Pacemaker may be MRI compatible. This will be discussed at your next office visit/wound check.  You should avoid contact with strong electric or magnetic fields.   Do not use amateur (ham) radio equipment or electric (arc) welding torches. MP3 player headphones with magnets should not be used. Some devices are safe to use if held at least 12 inches (30 cm) from your Pacemaker. These include power tools, lawn mowers, and speakers. If you are unsure if something is safe to use, ask your health care provider.  When using your cell phone, hold it to the ear that is on the opposite side from the Pacemaker.  Do not leave your cell phone in a pocket over the Pacemaker.  You may safely use electric blankets, heating pads, computers, and microwave ovens.  Call the office right away if: You have chest pain. You feel more short of breath than you have felt before. You feel more light-headed than you have felt before. Your incision starts to open up.  This information is not intended to replace advice given to you by your health care provider. Make sure you discuss any questions you have with your health care provider.

## 2022-03-23 ENCOUNTER — Encounter (HOSPITAL_COMMUNITY): Payer: Self-pay | Admitting: Internal Medicine

## 2022-03-23 ENCOUNTER — Ambulatory Visit: Payer: Medicare Other | Admitting: Cardiovascular Disease

## 2022-03-24 ENCOUNTER — Encounter (HOSPITAL_COMMUNITY): Payer: Self-pay | Admitting: Internal Medicine

## 2022-04-05 ENCOUNTER — Ambulatory Visit: Payer: Medicare Other | Attending: Cardiology

## 2022-04-05 DIAGNOSIS — I5022 Chronic systolic (congestive) heart failure: Secondary | ICD-10-CM | POA: Insufficient documentation

## 2022-04-05 LAB — CUP PACEART INCLINIC DEVICE CHECK
Battery Remaining Longevity: 100 mo
Battery Voltage: 3.16 V
Brady Statistic RA Percent Paced: 0.03 %
Brady Statistic RV Percent Paced: 98.48 %
Date Time Interrogation Session: 20240306160547
Implantable Lead Connection Status: 753985
Implantable Lead Connection Status: 753985
Implantable Lead Connection Status: 753985
Implantable Lead Implant Date: 20040309
Implantable Lead Implant Date: 20040309
Implantable Lead Implant Date: 20240221
Implantable Lead Location: 753858
Implantable Lead Location: 753859
Implantable Lead Location: 753860
Implantable Lead Model: 158
Implantable Lead Model: 158
Implantable Lead Model: 5076
Implantable Lead Serial Number: 115061
Implantable Pulse Generator Implant Date: 20240221
Lead Channel Impedance Value: 323 Ohm
Lead Channel Impedance Value: 361 Ohm
Lead Channel Impedance Value: 361 Ohm
Lead Channel Impedance Value: 418 Ohm
Lead Channel Impedance Value: 456 Ohm
Lead Channel Impedance Value: 456 Ohm
Lead Channel Impedance Value: 513 Ohm
Lead Channel Impedance Value: 513 Ohm
Lead Channel Impedance Value: 589 Ohm
Lead Channel Impedance Value: 703 Ohm
Lead Channel Impedance Value: 722 Ohm
Lead Channel Impedance Value: 760 Ohm
Lead Channel Impedance Value: 760 Ohm
Lead Channel Impedance Value: 931 Ohm
Lead Channel Pacing Threshold Amplitude: 1.125 V
Lead Channel Pacing Threshold Amplitude: 2.25 V
Lead Channel Pacing Threshold Amplitude: 3 V
Lead Channel Pacing Threshold Pulse Width: 0.4 ms
Lead Channel Pacing Threshold Pulse Width: 0.4 ms
Lead Channel Pacing Threshold Pulse Width: 0.4 ms
Lead Channel Sensing Intrinsic Amplitude: 1.375 mV
Lead Channel Sensing Intrinsic Amplitude: 1.375 mV
Lead Channel Sensing Intrinsic Amplitude: 6.625 mV
Lead Channel Sensing Intrinsic Amplitude: 8 mV
Lead Channel Setting Pacing Amplitude: 2 V
Lead Channel Setting Pacing Amplitude: 2.5 V
Lead Channel Setting Pacing Amplitude: 4 V
Lead Channel Setting Pacing Pulse Width: 0.4 ms
Lead Channel Setting Pacing Pulse Width: 0.4 ms
Lead Channel Setting Sensing Sensitivity: 0.9 mV
Zone Setting Status: 755011
Zone Setting Status: 755011

## 2022-04-05 NOTE — Patient Instructions (Addendum)
  After Your Pacemaker  Do NOT take your Eliquis for 5 days.  You may resume your Eliquis on April 10, 2022 your PM dose.  Follow up wound check April 11, 2022 at 2:30 pm.   Monitor your pacemaker site for redness, swelling, and drainage. Call the device clinic at [phone] if you experience these symptoms or fever/chills.  Your incision was closed with Dermabond:  You may shower 1 day after your defibrillator implant and wash your incision with soap and water. Avoid lotions, ointments, or perfumes over your incision until it is well-healed.  You may use a hot tub or a pool after your wound check appointment if the incision is completely closed.  Do not lift, push or pull greater than 10 pounds with the affected arm until 6 weeks after your procedure. There are no other restrictions in arm movement after your wound check appointment.  You may drive, unless driving has been restricted by your healthcare providers.  Remote monitoring is used to monitor your pacemaker from home. This monitoring is scheduled every 91 days by our office. It allows [name] to keep an eye on the functioning of your device to ensure it is working properly. You will routinely see your Electrophysiologist annually (more often if necessary).

## 2022-04-05 NOTE — Progress Notes (Signed)
Wound check appointment. Steri-strips removed. Wound without redness.  Swelling over device site noted and assessed by WC.  Pt will hold Eliquis x 5 days and recheck next week. Incision edges approximated. Normal device function. Thresholds, sensing, and impedances consistent with implant measurements. Device programmed at acute settings for new LV lead/chronic settings for existing leads. Histogram distribution appropriate for patient and level of activity. Chronic afib on Eliquis. Patient educated about wound care, arm mobility, lifting restrictions. ROV in 3 months with implanting physician.

## 2022-04-10 ENCOUNTER — Ambulatory Visit: Payer: Medicare Other | Attending: Cardiovascular Disease

## 2022-04-10 DIAGNOSIS — I5022 Chronic systolic (congestive) heart failure: Secondary | ICD-10-CM

## 2022-04-10 MED ORDER — CEPHALEXIN 500 MG PO CAPS: 500.0000 mg | ORAL_CAPSULE | Freq: Three times a day (TID) | ORAL | 0 refills | Status: AC

## 2022-04-10 NOTE — Progress Notes (Signed)
Pt seen in device clinic for follow up of hematoma post device implant.  Pt evaluated with Dr. Caryl Comes.  Pressure placed to site.  Dark sanguinous fluid expressed.  Area cleansed and pressure dressing applied.  Order placed for Keflex.  Follow up appointment made for this Thursday to reassess site.  Pt will continue to hold Eliquis until next appt.

## 2022-04-10 NOTE — Patient Instructions (Signed)
Medication Instructions:  Your physician has recommended you make the following change in your medication:    Start taking Keflex 500 mg-  Take one capsule by mouth every 8 hours for 5 days.  Labwork: None ordered.  Testing/Procedures: None ordered.  Follow-Up:

## 2022-04-11 ENCOUNTER — Encounter: Payer: Medicare Other | Admitting: Internal Medicine

## 2022-04-11 ENCOUNTER — Ambulatory Visit: Payer: Medicare Other

## 2022-04-13 ENCOUNTER — Ambulatory Visit: Payer: Medicare Other | Attending: Internal Medicine

## 2022-04-16 ENCOUNTER — Telehealth: Payer: Self-pay | Admitting: Physician Assistant

## 2022-04-16 NOTE — Telephone Encounter (Signed)
89 yo male with recent device change to a CRT-P. He has had a recurring hematoma requiring several pressure dressings. His Eliquis is also on hold. He was last seen on Thursday and has a f/u Tuesday. He called the answering service b/c his site has become firm again.  Reviewed with Dr. Caryl Comes. The patient will need to come in today for reapplication of a pressure dressing. Discussed with Dr. Myles Gip who will meet the patient and arrange. Patient knows to call us when he arrives.  Richardson Dopp, PA-C    04/16/2022 9:33 AM

## 2022-04-17 ENCOUNTER — Encounter: Payer: Self-pay | Admitting: Cardiovascular Disease

## 2022-04-17 NOTE — Progress Notes (Signed)
Patient presented to [facility] on 04/16/22 with increased swelling and bruising at his pacemaker pocket site. There was no bleeding, drainage, erythema. The site was appropriately tender. I was not able to express any fluid with gently pressure as had been done previously. I applied a pressure dressing.

## 2022-04-18 ENCOUNTER — Ambulatory Visit: Payer: Medicare Other | Attending: Cardiology

## 2022-04-18 DIAGNOSIS — I5022 Chronic systolic (congestive) heart failure: Secondary | ICD-10-CM

## 2022-04-18 NOTE — Patient Instructions (Addendum)
Medication Instructions:  Your physician has recommended you make the following change in your medication:    Continue to HOLD your Eliquis until your next appointment. 2.   Wear the compression dressing until Saturday.  Then you may remove it.  Labwork: None ordered.  Testing/Procedures: None ordered.  Follow-Up:  You will follow up for a wound check on Monday April 24, 2022 at 11:30 am.

## 2022-04-18 NOTE — Progress Notes (Signed)
Pt seen in device clinic for follow up of device site hematoma.  Pt had pressure dressing applied this last Sunday at the hospital by Dr. Myles Gip.  Pressure dressing removed.  Site evaluated by Dr. Caryl Comes.  Site appears unchanged since last assessed.  Site is soft post removal of pressure dressing.  Orders received:   Continue to monitor site Do NOT restart Eliquis until after next follow up Pocket pal pressure dressing applied Pt will wear pressure dressing daily until this Saturday April 22, 2022 and then remove Follow up scheduled with Dr. Caryl Comes for Monday March 25 at 11:30 am to reevaluate site and make further determination on course of treatment.

## 2022-04-24 ENCOUNTER — Ambulatory Visit: Payer: Medicare Other | Attending: Cardiovascular Disease

## 2022-04-24 DIAGNOSIS — I5022 Chronic systolic (congestive) heart failure: Secondary | ICD-10-CM

## 2022-04-24 DIAGNOSIS — Z95810 Presence of automatic (implantable) cardiac defibrillator: Secondary | ICD-10-CM

## 2022-04-24 NOTE — Patient Instructions (Signed)
Continue to hold Eliquis.  Device clinic appt this Friday 04/28/22 at 1:00 pm.

## 2022-04-24 NOTE — Progress Notes (Signed)
Pt seen in device clinic to reassess implant site.  Site evaluated with Dr. Caryl Comes.  Site appears to be improving.  Site is soft.  No new bruising appreciated.  Pt will continue to hold Eliquis.  Follow up made for this Friday 04/28/22 at 1:00 pm.  If site continues to be stable may restart Eliquis at that visit.

## 2022-04-28 ENCOUNTER — Ambulatory Visit: Payer: Medicare Other | Attending: Internal Medicine

## 2022-04-28 DIAGNOSIS — I5022 Chronic systolic (congestive) heart failure: Secondary | ICD-10-CM

## 2022-04-28 DIAGNOSIS — Z95810 Presence of automatic (implantable) cardiac defibrillator: Secondary | ICD-10-CM

## 2022-04-28 NOTE — Patient Instructions (Signed)
Restart Eliquis May 02, 2022. Follow up as scheduled.

## 2022-04-28 NOTE — Progress Notes (Signed)
Pt seen in device clinic for wound recheck to determine if he can restart Eliquis.  Hematoma has remained stable since last seen in clinic.  Site is soft to touch.  No new bleeding/bruising noted.  Assessed with Dr. Lovena Le.  Per Dr. Lovena Le restart Eliquis May 02, 2022.  Continue to closely monitor and call device clinic if any changes noted.  Pt and daughter aware.  Follow up as previously scheduled for 91 day follow up post implant.

## 2022-05-02 ENCOUNTER — Encounter: Payer: Self-pay | Admitting: Internal Medicine

## 2022-05-02 DIAGNOSIS — K4090 Unilateral inguinal hernia, without obstruction or gangrene, not specified as recurrent: Secondary | ICD-10-CM

## 2022-05-16 DIAGNOSIS — E1122 Type 2 diabetes mellitus with diabetic chronic kidney disease: Secondary | ICD-10-CM | POA: Diagnosis not present

## 2022-05-16 DIAGNOSIS — E1159 Type 2 diabetes mellitus with other circulatory complications: Secondary | ICD-10-CM | POA: Diagnosis not present

## 2022-05-16 DIAGNOSIS — E785 Hyperlipidemia, unspecified: Secondary | ICD-10-CM | POA: Diagnosis not present

## 2022-05-16 DIAGNOSIS — E559 Vitamin D deficiency, unspecified: Secondary | ICD-10-CM | POA: Diagnosis not present

## 2022-05-16 DIAGNOSIS — E1169 Type 2 diabetes mellitus with other specified complication: Secondary | ICD-10-CM | POA: Diagnosis not present

## 2022-05-16 DIAGNOSIS — I2510 Atherosclerotic heart disease of native coronary artery without angina pectoris: Secondary | ICD-10-CM | POA: Diagnosis not present

## 2022-05-16 DIAGNOSIS — R748 Abnormal levels of other serum enzymes: Secondary | ICD-10-CM | POA: Diagnosis not present

## 2022-05-16 DIAGNOSIS — N182 Chronic kidney disease, stage 2 (mild): Secondary | ICD-10-CM | POA: Diagnosis not present

## 2022-05-17 ENCOUNTER — Encounter: Payer: Self-pay | Admitting: Internal Medicine

## 2022-05-17 NOTE — Progress Notes (Unsigned)
Saw the patient in the office about 8 weeks ago in anticipation of device generator replacement He had an easily reducible r inguinal hernia.  In discussing the situation with retired local general surgeion ( neighbor) he recommended an outpt surgical consultation  Will reach out to [facility]

## 2022-05-19 DIAGNOSIS — K4090 Unilateral inguinal hernia, without obstruction or gangrene, not specified as recurrent: Secondary | ICD-10-CM | POA: Diagnosis not present

## 2022-05-24 ENCOUNTER — Encounter: Payer: Self-pay | Admitting: Cardiovascular Disease

## 2022-05-24 ENCOUNTER — Ambulatory Visit: Payer: Medicare Other | Attending: Cardiovascular Disease | Admitting: Cardiovascular Disease

## 2022-05-24 VITALS — BP 110/72 | HR 88 | Ht 73.0 in | Wt 185.2 lb

## 2022-05-24 DIAGNOSIS — K4090 Unilateral inguinal hernia, without obstruction or gangrene, not specified as recurrent: Secondary | ICD-10-CM | POA: Diagnosis not present

## 2022-05-24 DIAGNOSIS — I5022 Chronic systolic (congestive) heart failure: Secondary | ICD-10-CM | POA: Diagnosis not present

## 2022-05-24 DIAGNOSIS — I25119 Atherosclerotic heart disease of native coronary artery with unspecified angina pectoris: Secondary | ICD-10-CM | POA: Insufficient documentation

## 2022-05-24 DIAGNOSIS — I4819 Other persistent atrial fibrillation: Secondary | ICD-10-CM | POA: Insufficient documentation

## 2022-05-24 NOTE — Progress Notes (Signed)
Cardiology Office Note:    Date:  05/24/2022   ID:  [name], DOB April 18, 1931, MRN [id number]  PCP:  Charlane Ferretti, DO   [facility] Providers Cardiologist:  [name], MD Electrophysiologist:  Sherryl Manges, MD     Referring MD: Charlane Ferretti, DO   Chief Complaint  Patient presents with   Shortness of Breath    History of Present Illness:    [name] is a 89 y.o. male with a hx of: Coronary artery disease  Mitral valve disease S/p CABG, MV repair  Cath 12/18: oLAD 50, pLAD 50, oD1 50; oLCx 60, OM2 95; RCA irregs; R Radial-OM2 and L-LAD patent >> Med rx  Hypertension  Diabetes mellitus  Persistent atrial fibrillation  Dofetilide Rx, Apixaban Rx  (HFrEF) heart failure with reduced ejection fraction  Echocardiogram 10/2018: EF 35-40 Echocardiogram 4/22: EF 35-40  Pulmonary hypertension  Echocardiogram 4/22: RVSP 45.5  Ventricular tachycardia  S/p ICD  Carotid artery disease S/p R CEA  Abdominal aortic aneurysm  CT 12/2018 5.7 cm (followed by vasc surg) -to be managed conservatively  11. CRT-P 03/22/22  The patient is here with his daughter today.  He reports remarkable improvement in his health since undergoing CRT-P in February.  He was aggressively diuresed before the procedure, but really noticed an acute improvement in his symptoms after his device implant.  He actually stopped taking torsemide about 3 weeks ago and has not gained any weight or had any recurrence of edema.  He states that he feels as good as he did a few years ago now.  His breathing has improved significantly.  His fatigue has also improved.  No further orthopnea or PND.  No chest pain or pressure.  Past Medical History:  Diagnosis Date   Acute on chronic combined systolic (congestive) and diastolic (congestive) heart failure    AICD (automatic cardioverter/defibrillator) present    [employer]n PPM/ICD   Carotid stenosis, right    s/p endarterectomy 11/07/18   Chronic  combined systolic (congestive) and diastolic (congestive) heart failure    Coronary artery disease    [facility]   Diabetes mellitus    Dysrhythmia    Endocarditis, valve unspecified    Mitral   Headache    hx migraines 6-7 x mo   History of migraine headaches    History of seasonal allergies    ICD (implantable cardiac defibrillator) in place    Optic nerve hemorrhage, 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  We will monitor this closely and look for changes over the ensuing 6 months.  Repeat visit here in 6 months   PAF (paroxysmal atrial fibrillation)    NO LAA occlusion at the time of surgery  [name] 2018   Pleural effusion    PVC's (premature ventricular contractions)    Ventricular tachycardia, polymorphic    status post ICD implantation    Past Surgical History:  Procedure Laterality Date   BIV UPGRADE N/A 03/22/2022   Procedure: BIV UPGRADE;  Surgeon: Duke Salvia, MD;  Location: [facility] INVASIVE CV LAB;  Service: Cardiovascular;  Laterality: N/A;   BRONCHIAL BIOPSY  07/22/2020   Procedure: BRONCHIAL BIOPSIES;  Surgeon: Leslye Peer, MD;  Location: [facility] ENDOSCOPY;  Service: Cardiopulmonary;;   BRONCHIAL BRUSHINGS  07/22/2020   Procedure: BRONCHIAL BRUSHINGS;  Surgeon: Leslye Peer, MD;  Location: [facility] ENDOSCOPY;  Service: Cardiopulmonary;;  BRONCHIAL WASHINGS  07/22/2020   Procedure: BRONCHIAL WASHINGS;  Surgeon: Leslye Peer, MD;  Location: [facility] ENDOSCOPY;  Service: Cardiopulmonary;;   CARDIAC CATHETERIZATION  04/03/2007   EF 40%   CARDIAC CATHETERIZATION  02/06/2006   EF 45%. ANTERIOR HYPOKINESIS   CARDIAC CATHETERIZATION  05/29/2000   EF 50%. MILD ANTERIOR HYPOKINESIS   CARDIAC CATHETERIZATION  10/25/1999   EF 50%. SEVERE MITRAL REGURGITATION   CARDIAC CATHETERIZATION  01/06/2003   EF 50%   CARDIAC DEFIBRILLATOR PLACEMENT     CARDIOVERSION N/A 02/07/2019    Procedure: CARDIOVERSION;  Surgeon: Pricilla Riffle, MD;  Location: [facility] ENDOSCOPY;  Service: Cardiovascular;  Laterality: N/A;   CARDIOVERSION N/A 11/24/2019   Procedure: CARDIOVERSION;  Surgeon: Wendall Stade, MD;  Location: [facility] ENDOSCOPY;  Service: Cardiovascular;  Laterality: N/A;   CARDIOVERSION N/A 02/15/2022   Procedure: CARDIOVERSION;  Surgeon: Jake Bathe, MD;  Location: [facility] ENDOSCOPY;  Service: Cardiovascular;  Laterality: N/A;   CARDIOVERSION N/A 03/03/2022   Procedure: CARDIOVERSION;  Surgeon: Parke Poisson, MD;  Location: [facility] ENDOSCOPY;  Service: Cardiovascular;  Laterality: N/A;   CATARACT EXTRACTION W/ INTRAOCULAR LENS  IMPLANT, BILATERAL     CHOLECYSTECTOMY N/A 01/08/2019   Procedure: LAPAROSCOPIC CHOLECYSTECTOMY WITH INTRAOPERATIVE CHOLANGIOGRAM;  Surgeon: Manus Rudd, MD;  Location: [facility] OR;  Service: General;  Laterality: N/A;   CORONARY ARTERY BYPASS GRAFT  2001   2 VESSEL CABG AND MITRAL VALVE REPAIR. HE HAD A LIMA GRAFT TO THE LAD AND RADIAL ARTERY  GRAFT TO THE OBTUSE MARGINAL  OF LEFT CIRCUMFLEX   CORONARY PRESSURE/FFR STUDY N/A 01/05/2017   Procedure: INTRAVASCULAR PRESSURE WIRE/FFR STUDY;  Surgeon: [name], MD;  Location: [facility] INVASIVE CV LAB;  Service: Cardiovascular;  Laterality: N/A;   ENDARTERECTOMY Right 11/07/2018   Procedure: ENDARTERECTOMY CAROTID RIGHT;  Surgeon: Maeola Harman, MD;  Location: [facility] OR;  Service: Vascular;  Laterality: Right;   EP IMPLANTABLE DEVICE N/A 11/06/2014   Procedure: ICD Generator Changeout;  Surgeon: Duke Salvia, MD;  Location: [facility] INVASIVE CV LAB;  Service: Cardiovascular;  Laterality: N/A;   ERCP N/A 01/10/2019   Procedure: ENDOSCOPIC RETROGRADE CHOLANGIOPANCREATOGRAPHY (ERCP);  Surgeon: Vida Rigger, MD;  Location: [facility] ENDOSCOPY;  Service: Endoscopy;  Laterality: N/A;   FOREIGN BODY REMOVAL Right 06/21/2017   Procedure: FOREIGN BODY REMOVAL ADULT RIGHT RING FINGER;  Surgeon: Betha Loa, MD;  Location: [facility] OR;  Service:  Orthopedics;  Laterality: Right;   HEMORRHOID SURGERY     HEMORROIDECTOMY     KNEE ARTHROSCOPY     RIGHT KNEE   LAPAROSCOPIC APPENDECTOMY N/A 01/08/2019   Procedure: APPENDECTOMY LAPAROSCOPIC;  Surgeon: Manus Rudd, MD;  Location: [facility] OR;  Service: General;  Laterality: N/A;   LEFT HEART CATHETERIZATION WITH CORONARY ANGIOGRAM N/A 07/04/2012   Procedure: LEFT HEART CATHETERIZATION WITH CORONARY ANGIOGRAM;  Surgeon: [name], MD;  Location: [facility] CATH LAB;  Service: Cardiovascular;  Laterality: N/A;   MITRAL VALVE REPLACEMENT     No LAA occlusion at the time of surgery  Tamela Oddi report 2018   REMOVAL OF STONES  01/10/2019   Procedure: REMOVAL OF STONES;  Surgeon: Vida Rigger, MD;  Location: [facility] ENDOSCOPY;  Service: Endoscopy;;   RIGHT/LEFT HEART CATH AND CORONARY/GRAFT ANGIOGRAPHY N/A 01/05/2017   Procedure: RIGHT/LEFT HEART CATH AND CORONARY/GRAFT ANGIOGRAPHY;  Surgeon: [name], MD;  Location: [facility] INVASIVE CV LAB;  Service: Cardiovascular;  Laterality: N/A;   SPHINCTEROTOMY  01/10/2019   Procedure: SPHINCTEROTOMY;  Surgeon: Vida Rigger, MD;  Location: [facility] ENDOSCOPY;  Service:  Endoscopy;;   TRANSTHORACIC ECHOCARDIOGRAM  04/02/2007   EF 35%   VIDEO BRONCHOSCOPY Right 07/22/2020   Procedure: VIDEO BRONCHOSCOPY WITH FLUORO, BAL AND BIOPSY;  Surgeon: Leslye Peer, MD;  Location: [facility] ENDOSCOPY;  Service: Cardiopulmonary;  Laterality: Right;  CONSCIOUS SEDATION OR MAC    Current Medications: Current Meds  Medication Sig   albuterol (VENTOLIN HFA) 108 (90 Base) MCG/ACT inhaler Inhale 2 puffs into the lungs every 6 (six) hours as needed for wheezing or shortness of breath.   atorvastatin (LIPITOR) 80 MG tablet TAKE ONE TABLET BY MOUTH EVERY DAY FOR CHOLESTEROL   cetirizine (ZYRTEC) 10 MG tablet Take 10 mg by mouth daily.   ELIQUIS 5 MG TABS tablet TAKE ONE TABLET BY MOUTH TWICE DAILY   empagliflozin (JARDIANCE) 10 MG TABS tablet Take 10 mg by mouth in the morning.   EPIPEN 2-PAK 0.3  MG/0.3ML SOAJ injection Inject 0.3 mg into the muscle daily as needed for anaphylaxis.    Erenumab-aooe (AIMOVIG) 140 MG/ML SOAJ Inject 140 mg into the skin every 28 (twenty-eight) days.   fluticasone (FLONASE) 50 MCG/ACT nasal spray Place 1 spray into the nose daily as needed for rhinitis or allergies.   ibuprofen (ADVIL) 200 MG tablet Take 400 mg by mouth every 8 (eight) hours as needed (headaches/migraine).   Magnesium 250 MG TABS Take 250 mg by mouth in the morning.   metoprolol succinate (TOPROL-XL) 25 MG 24 hr tablet TAKE ONE TABLET BY MOUTH TWICE DAILY WITH OR IMMEDIATELY FOLLOWING A MEAL   Multiple Vitamin (MULTIVITAMIN WITH MINERALS) TABS tablet Take 1 tablet by mouth at bedtime. Centrum Silver   Multiple Vitamins-Minerals (PRESERVISION AREDS 2) CAPS Take 1 capsule by mouth daily.   nateglinide (STARLIX) 60 MG tablet Take 60 mg by mouth 2 (two) times daily with a meal.   ONETOUCH VERIO test strip SMARTSIG:Via Meter 1-2 Times Daily   sitaGLIPtin (JANUVIA) 100 MG tablet Take 100 mg by mouth at bedtime.   Tiotropium Bromide-Olodaterol (STIOLTO RESPIMAT) 2.5-2.5 MCG/ACT AERS Inhale 2 puffs into the lungs at bedtime. (Patient taking differently: Inhale 2 puffs into the lungs every morning.)   torsemide (DEMADEX) 20 MG tablet Take 2 tablets (40mg ) by mouth twice daily for 3 days, then decrease to 2 tablets (40mg ) once daily thereafter (Patient taking differently: Take 40 mg by mouth daily.)     Allergies:   Bee venom, Latex, Metformin, Rivaroxaban, Sotalol, Levofloxacin, and Warfarin sodium   Social History   Socioeconomic History   Marital status: Married    Spouse name: Elease Hashimoto   Number of children: 2   Years of education: Not on file   Highest education level: Associate degree: academic program  Occupational History   Occupation: retired  Tobacco Use   Smoking status: Former    Packs/day: 2.00    Years: 16.00    Additional pack years: 0.00    Total pack years: 32.00    Types:  Cigarettes    Start date: 02/23/1956    Quit date: 01/31/1971    Years since quitting: 51.3   Smokeless tobacco: Never  Vaping Use   Vaping Use: Never used  Substance and Sexual Activity   Alcohol use: Yes    Alcohol/week: 0.0 standard drinks of alcohol    Comment: 1-2 a week   Drug use: No   Sexual activity: Yes  Other Topics Concern   Not on file  Social History Narrative   Patient is right-handed. He lives with his wife ina 2 story home.  He drinks one cup of coffee and a diet coke a day.    Social Determinants of Health   Financial Resource Strain: Not on file  Food Insecurity: Not on file  Transportation Needs: Not on file  Physical Activity: Not on file  Stress: Not on file  Social Connections: Not on file     Family History: The patient's family history includes Cancer in his sister and another family member; Diabetes in an other family member; Emphysema in his father; Heart disease in his father, paternal grandfather, and paternal grandmother; Stroke in his mother.  ROS:   Please see the history of present illness.    All other systems reviewed and are negative.  EKGs/Labs/Other Studies Reviewed:    The following studies were reviewed today: Cardiac Studies & Procedures   CARDIAC CATHETERIZATION  CARDIAC CATHETERIZATION 01/05/2017  Narrative 1. Severe 2 vessel CAD with continued patency of the LIMA-LAD and right radial graft-OM 2. Moderate, severely calcified, proximal circumflex stenosis with negative FFR assessment 3. Normal right heart hemodynamics except for a prominent V wave in the PCWP tracing 4. Normal/low LVEDP  Recommend: ongoing medical therapy. Pt appears to be well-compensated from a hemodynamic perspective.  Findings Coronary Findings Diagnostic  Dominance: Right  Left Anterior Descending Ost LAD to Prox LAD lesion is 50% stenosed. The lesion is moderately calcified. Prox LAD lesion is 50% stenosed.  First Diagonal Branch Ost 1st Diag to  1st Diag lesion is 50% stenosed. The lesion is calcified.  Left Circumflex Ost Cx to Prox Cx lesion is 60% stenosed. The lesion is severely calcified. Eccentric calcified plaque in some views appears moderate-severe and in other views appears widely patent.  Second Obtuse Marginal Branch Ost 2nd Mrg lesion is 95% stenosed.  Right Coronary Artery Vessel is small. The vessel exhibits minimal luminal irregularities.  Right Radial Artery Graft To 2nd Mrg Right radial artery. Widely patent  LIMA LIMA Graft To Mid LAD LIMA graft was visualized by angiography and is normal in caliber. The graft exhibits no disease. Widely patent, imaged non-selectively  Intervention  No interventions have been documented.     ECHOCARDIOGRAM  ECHOCARDIOGRAM COMPLETE 03/07/2022  Narrative ECHOCARDIOGRAM REPORT    Patient Name:   ZAYAAN KOZAK [facility] Date of Exam: 03/07/2022 Medical Rec #:  [id number]        Height:       73.0 in Accession #:    [id number]       Weight:       186.0 lb Date of Birth:  Oct 24, 1931        BSA:          2.086 m Patient Age:    89 years         BP:           106/73 mmHg Patient Gender: M                HR:           89 bpm. Exam Location:  Outpatient  Procedure: 2D Echo, Cardiac Doppler, Color Doppler, 3D Echo and Strain Analysis  Indications:    Acute on chronic systolic (congestive) heart failure (HCC) [[id number]]  History:        Patient has prior history of Echocardiogram examinations, most recent 05/06/2020. CHF, CAD, Defibrillator and Prior CABG, Mitral Valve Disease, Arrythmias:PVC and Atrial Fibrillation, Signs/Symptoms:Edema; Risk Factors:Diabetes and Family History of Coronary Artery Disease. History of Mitral Valve Endocarditis (2001) status post Mitral Valve Repair and CABG, Plueral  Effusion.  Sonographer:    Eulah Pont RDCS Referring Phys: [phone] Acelin Ferdig  IMPRESSIONS   1. Left ventricular ejection fraction, by estimation, is 20 to 25%. Left  ventricular ejection fraction by 3D volume is 25 %. The left ventricle has severely decreased function. The left ventricle demonstrates global hypokinesis. Indeterminate diastolic filling due to E-A fusion. The average left ventricular global longitudinal strain is -4.3 %. The global longitudinal strain is abnormal. 2. Right ventricular systolic function is severely reduced. The right ventricular size is severely enlarged. There is moderately elevated pulmonary artery systolic pressure. The estimated right ventricular systolic pressure is 45.2 mmHg. 3. Left atrial size was severely dilated. 4. Right atrial size was severely dilated. 5. The mitral valve is degenerative. Mild mitral valve regurgitation. Mild mitral stenosis. The mean mitral valve gradient is 4.0 mmHg with average heart rate of 80 bpm. Severe mitral annular calcification. 6. Tricuspid valve regurgitation is severe. 7. The aortic valve is tricuspid. There is mild calcification of the aortic valve. There is mild thickening of the aortic valve. Aortic valve regurgitation is not visualized. No aortic stenosis is present. 8. The inferior vena cava is dilated in size with <50% respiratory variability, suggesting right atrial pressure of 15 mmHg.  Comparison(s): Changes from prior study are noted. The left ventricular function is significantly worse.  FINDINGS Left Ventricle: Left ventricular ejection fraction, by estimation, is 20 to 25%. Left ventricular ejection fraction by 3D volume is 25 %. The left ventricle has severely decreased function. The left ventricle demonstrates global hypokinesis. The average left ventricular global longitudinal strain is -4.3 %. The global longitudinal strain is abnormal. The left ventricular internal cavity size was normal in size. There is no left ventricular hypertrophy. Abnormal (paradoxical) septal motion, consistent with RV pacemaker and abnormal (paradoxical) septal motion consistent with post-operative  status. Indeterminate diastolic filling due to E-A fusion.  Right Ventricle: The right ventricular size is severely enlarged. No increase in right ventricular wall thickness. Right ventricular systolic function is severely reduced. There is moderately elevated pulmonary artery systolic pressure. The tricuspid regurgitant velocity is 2.75 m/s, and with an assumed right atrial pressure of 15 mmHg, the estimated right ventricular systolic pressure is 45.2 mmHg.  Left Atrium: Left atrial size was severely dilated.  Right Atrium: Right atrial size was severely dilated.  Pericardium: There is no evidence of pericardial effusion.  Mitral Valve: The mitral valve is degenerative in appearance. There is moderate calcification of the anterior and posterior mitral valve leaflet(s). Severe mitral annular calcification. Mild mitral valve regurgitation. Mild mitral valve stenosis. MV peak gradient, 13.1 mmHg. The mean mitral valve gradient is 4.0 mmHg with average heart rate of 80 bpm.  Tricuspid Valve: The tricuspid valve is grossly normal. Tricuspid valve regurgitation is severe. No evidence of tricuspid stenosis. The flow in the hepatic veins is reversed during ventricular systole.  Aortic Valve: The aortic valve is tricuspid. There is mild calcification of the aortic valve. There is mild thickening of the aortic valve. Aortic valve regurgitation is not visualized. No aortic stenosis is present.  Pulmonic Valve: The pulmonic valve was grossly normal. Pulmonic valve regurgitation is mild. No evidence of pulmonic stenosis.  Aorta: The aortic root and ascending aorta are structurally normal, with no evidence of dilitation.  Venous: The inferior vena cava is dilated in size with less than 50% respiratory variability, suggesting right atrial pressure of 15 mmHg.  IAS/Shunts: The atrial septum is grossly normal.  Additional Comments: A device lead is visualized  in the right atrium and right  ventricle.   LEFT VENTRICLE PLAX 2D LVIDd:         5.50 cm LVIDs:         4.60 cm         2D LV PW:         0.90 cm         Longitudinal LV IVS:        0.90 cm         Strain LVOT diam:     2.20 cm         2D Strain GLS  -4.3 % LV SV:         37              Avg: LV SV Index:   18 LVOT Area:     3.80 cm        3D Volume EF LV 3D EF:    Left ventricul LV Volumes (MOD)                            ar LV vol d, MOD    242.0 ml                   ejection A2C:                                        fraction LV vol d, MOD    151.0 ml                   by 3D A4C:                                        volume is LV vol s, MOD    190.0 ml                   25 %. A2C: LV vol s, MOD    107.0 ml A4C:                           3D Volume EF: LV SV MOD A2C:   52.0 ml       3D EF:        25 % LV SV MOD A4C:   151.0 ml      LV EDV:       252 ml LV SV MOD BP:    49.9 ml       LV ESV:       188 ml LV SV:        64 ml  RIGHT VENTRICLE RV S prime:     4.22 cm/s TAPSE (M-mode): 0.9 cm  LEFT ATRIUM              Index        RIGHT ATRIUM           Index LA diam:        6.40 cm  3.07 cm/m   RA Area:     34.30 cm LA Vol (A2C):   160.0 ml 76.69 ml/m  RA Volume:   130.00 ml 62.31 ml/m LA Vol (A4C):   143.0 ml 68.54 ml/m LA Biplane Vol: 158.0 ml 75.73 ml/m  AORTIC VALVE             PULMONIC VALVE LVOT Vmax:   59.20 cm/s  PR End Diast Vel: 8.64 msec LVOT Vmean:  35.200 cm/s LVOT VTI:    0.098 m  AORTA Ao Root diam: 3.40 cm Ao Asc diam:  3.60 cm  MITRAL VALVE                  TRICUSPID VALVE MV Area (PHT): 2.86 cm       TR Peak grad:   30.2 mmHg MV Area VTI:   1.19 cm       TR Mean grad:   19.0 mmHg MV Peak grad:  13.1 mmHg      TR Vmax:        275.00 cm/s MV Mean grad:  4.0 mmHg       TR Vmean:       208.0 cm/s MV Vmax:       1.81 m/s MV Vmean:      83.0 cm/s      SHUNTS MR Peak grad:    45.3 mmHg    Systemic VTI:  0.10 m MR Mean grad:    28.5 mmHg    Systemic Diam: 2.20 cm MR Vmax:          336.50 cm/s MR Vmean:        254.0 cm/s MR PISA:         1.01 cm MR PISA Eff ROA: 12 mm MR PISA Radius:  0.40 cm  Lennie Odor MD Electronically signed by Lennie Odor MD Signature Date/Time: 03/07/2022/12:33:48 PM    Final              EKG:  EKG today shows ventricular paced rhythm with a rate of 88 bpm and underlying atrial flutter, biventricular pacemaker pattern  Recent Labs: 02/07/2022: TSH 5.170 03/01/2022: Hemoglobin 14.9; NT-Pro BNP 7,479; Platelets 129 03/14/2022: ALT 24; BUN 27; Creatinine, Ser 1.37; Potassium 4.3; Sodium 138  Recent Lipid Panel    Component Value Date/Time   CHOL 110 11/05/2018 0256   TRIG 90 11/05/2018 0256   HDL 48 11/05/2018 0256   CHOLHDL 2.3 11/05/2018 0256   VLDL 18 11/05/2018 0256   LDLCALC 44 11/05/2018 0256     Risk Assessment/Calculations:    CHA2DS2-VASc Score = 5   This indicates a 7.2% annual risk of stroke. The patient's score is based upon: CHF History: 1 HTN History: 0 Diabetes History: 1 Stroke History: 0 Vascular Disease History: 1 Age Score: 2 Gender Score: 0               Physical Exam:    VS:  BP 110/72   Pulse 88   Ht 6\' 1"  (1.854 m)   Wt 185 lb 3.2 oz (84 kg)   SpO2 98%   BMI 24.43 kg/m     Wt Readings from Last 3 Encounters:  05/24/22 185 lb 3.2 oz (84 kg)  03/22/22 179 lb (81.2 kg)  03/14/22 188 lb (85.3 kg)     GEN:  Well nourished, well developed elderly male in no acute distress HEENT: Normal NECK: JVP mildly elevated; No carotid bruits LYMPHATICS: No lymphadenopathy CARDIAC: RRR, 2/6 ejection murmur at the right upper sternal border RESPIRATORY:  Clear to auscultation without rales, wheezing or rhonchi  ABDOMEN: Soft, non-tender, non-distended MUSCULOSKELETAL:  No edema; No deformity  SKIN: Warm and dry NEUROLOGIC:  Alert and oriented x 3 PSYCHIATRIC:  Normal affect   ASSESSMENT:  1. Chronic systolic heart failure   2. Persistent atrial fibrillation   3. Right inguinal  hernia   4. Coronary artery disease involving native coronary artery of native heart with angina pectoris    PLAN:    In order of problems listed above:  The patient has had a remarkable response to cardiac resynchronization.  He really has gone from what I felt was end-stage disease to marked improvement in his functional capacity, now with NYHA functional class II symptoms.  2 months ago he was in a wheelchair when he came in and really had class IV symptoms.  Will repeat an echocardiogram in July.  Will continue Jardiance and metoprolol succinate.  Will hold on torsemide unless he gains weight.  He weighs daily and will follow a sliding scale.  If he gains 3 pounds within 24 hours or 5 pounds from baseline, he will start back on torsemide 3 days/week. Anticoagulated with apixaban, treated with metoprolol.  Stable. The patient has developed a right inguinal hernia and is hoping to have surgery later this year.  Will plan to update his echocardiogram in July and arrange a follow-up visit after his echo for clearance. The patient has no anginal symptoms currently controlled on metoprolol succinate.  He is treated with atorvastatin.  No antiplatelet therapy in the context of chronic oral anticoagulation.     Medication Adjustments/Labs and Tests Ordered: Current medicines are reviewed at length with the patient today.  Concerns regarding medicines are outlined above.  Orders Placed This Encounter  Procedures   EKG 12-Lead   No orders of the defined types were placed in this encounter.   Patient Instructions  Medication Instructions:  Your physician recommends that you continue on your current medications as directed. Please refer to the Current Medication list given to you today.  *If you need a refill on your cardiac medications before your next appointment, please call your pharmacy*  Lab Work: If you have labs (blood work) drawn today and your tests are completely normal, you will  receive your results only by: MyChart Message (if you have MyChart) OR A paper copy in the mail If you have any lab test that is abnormal or we need to change your treatment, we will call you to review the results.  Testing/Procedures: Your physician has requested that you have an echocardiogram rescheduled for July, not October. Echocardiography is a painless test that uses sound waves to create images of your heart. It provides your doctor with information about the size and shape of your heart and how well your heart's chambers and valves are working. This procedure takes approximately one hour. There are no restrictions for this procedure. Please do NOT wear cologne, perfume, aftershave, or lotions (deodorant is allowed). Please arrive 15 minutes prior to your appointment time.  Follow-Up: At [facility], you and your health needs are our priority.  As part of our continuing mission to provide you with exceptional heart care, we have created designated Provider Care Teams.  These Care Teams include your primary Cardiologist (physician) and Advanced Practice Providers (APPs -  Physician Assistants and Nurse Practitioners) who all work together to provide you with the care you need, when you need it.  We recommend signing up for the patient portal called "MyChart".  Sign up information is provided on this After Visit Summary.  MyChart is used to connect with patients for Virtual Visits (Telemedicine).  Patients are able to view lab/test results, encounter notes, upcoming appointments, etc.  Non-urgent messages can be sent to your provider as well.   To learn more about what you can do with MyChart, go to [email or web address].    Your next appointment:   4 month(s)  Provider:   Tereso Newcomer, PA-C         Signed, [name], MD  05/24/2022 5:05 PM    [facility]

## 2022-05-24 NOTE — Patient Instructions (Signed)
Medication Instructions:  Your physician recommends that you continue on your current medications as directed. Please refer to the Current Medication list given to you today.  *If you need a refill on your cardiac medications before your next appointment, please call your pharmacy*  Lab Work: If you have labs (blood work) drawn today and your tests are completely normal, you will receive your results only by: MyChart Message (if you have MyChart) OR A paper copy in the mail If you have any lab test that is abnormal or we need to change your treatment, we will call you to review the results.  Testing/Procedures: Your physician has requested that you have an echocardiogram rescheduled for July, not October. Echocardiography is a painless test that uses sound waves to create images of your heart. It provides your doctor with information about the size and shape of your heart and how well your heart's chambers and valves are working. This procedure takes approximately one hour. There are no restrictions for this procedure. Please do NOT wear cologne, perfume, aftershave, or lotions (deodorant is allowed). Please arrive 15 minutes prior to your appointment time.  Follow-Up: At [facility], you and your health needs are our priority.  As part of our continuing mission to provide you with exceptional heart care, we have created designated Provider Care Teams.  These Care Teams include your primary Cardiologist (physician) and Advanced Practice Providers (APPs -  Physician Assistants and Nurse Practitioners) who all work together to provide you with the care you need, when you need it.  We recommend signing up for the patient portal called "MyChart".  Sign up information is provided on this After Visit Summary.  MyChart is used to connect with patients for Virtual Visits (Telemedicine).  Patients are able to view lab/test results, encounter notes, upcoming appointments, etc.  Non-urgent messages  can be sent to your provider as well.   To learn more about what you can do with MyChart, go to [email or web address].    Your next appointment:   4 month(s)  Provider:   Tereso Newcomer, PA-C

## 2022-06-01 ENCOUNTER — Encounter (INDEPENDENT_AMBULATORY_CARE_PROVIDER_SITE_OTHER): Payer: Medicare Other | Admitting: Ophthalmology

## 2022-06-01 ENCOUNTER — Encounter (INDEPENDENT_AMBULATORY_CARE_PROVIDER_SITE_OTHER): Payer: Self-pay

## 2022-06-01 DIAGNOSIS — E113393 Type 2 diabetes mellitus with moderate nonproliferative diabetic retinopathy without macular edema, bilateral: Secondary | ICD-10-CM | POA: Diagnosis not present

## 2022-06-01 DIAGNOSIS — H353131 Nonexudative age-related macular degeneration, bilateral, early dry stage: Secondary | ICD-10-CM | POA: Diagnosis not present

## 2022-06-08 ENCOUNTER — Encounter: Payer: Medicare Other | Admitting: Internal Medicine

## 2022-06-14 DIAGNOSIS — L821 Other seborrheic keratosis: Secondary | ICD-10-CM | POA: Diagnosis not present

## 2022-06-14 DIAGNOSIS — Z85828 Personal history of other malignant neoplasm of skin: Secondary | ICD-10-CM | POA: Diagnosis not present

## 2022-06-14 DIAGNOSIS — D045 Carcinoma in situ of skin of trunk: Secondary | ICD-10-CM | POA: Diagnosis not present

## 2022-06-14 DIAGNOSIS — D0472 Carcinoma in situ of skin of left lower limb, including hip: Secondary | ICD-10-CM | POA: Diagnosis not present

## 2022-06-14 DIAGNOSIS — L858 Other specified epidermal thickening: Secondary | ICD-10-CM | POA: Diagnosis not present

## 2022-06-14 DIAGNOSIS — L720 Epidermal cyst: Secondary | ICD-10-CM | POA: Diagnosis not present

## 2022-06-14 DIAGNOSIS — D1801 Hemangioma of skin and subcutaneous tissue: Secondary | ICD-10-CM | POA: Diagnosis not present

## 2022-06-14 DIAGNOSIS — D485 Neoplasm of uncertain behavior of skin: Secondary | ICD-10-CM | POA: Diagnosis not present

## 2022-06-19 ENCOUNTER — Encounter: Payer: Medicare Other | Admitting: Internal Medicine

## 2022-06-22 ENCOUNTER — Ambulatory Visit (INDEPENDENT_AMBULATORY_CARE_PROVIDER_SITE_OTHER): Payer: Medicare Other

## 2022-06-22 DIAGNOSIS — I472 Ventricular tachycardia, unspecified: Secondary | ICD-10-CM | POA: Diagnosis not present

## 2022-06-22 LAB — CUP PACEART REMOTE DEVICE CHECK
Battery Remaining Longevity: 99 mo
Battery Voltage: 3.09 V
Brady Statistic RA Percent Paced: 0.64 %
Brady Statistic RV Percent Paced: 90.03 %
Date Time Interrogation Session: 20240523051319
Implantable Lead Connection Status: 753985
Implantable Lead Connection Status: 753985
Implantable Lead Connection Status: 753985
Implantable Lead Implant Date: 20040309
Implantable Lead Implant Date: 20040309
Implantable Lead Implant Date: 20240221
Implantable Lead Location: 753858
Implantable Lead Location: 753859
Implantable Lead Location: 753860
Implantable Lead Model: 158
Implantable Lead Model: 158
Implantable Lead Model: 5076
Implantable Lead Serial Number: 115061
Implantable Pulse Generator Implant Date: 20240221
Lead Channel Impedance Value: 304 Ohm
Lead Channel Impedance Value: 342 Ohm
Lead Channel Impedance Value: 380 Ohm
Lead Channel Impedance Value: 418 Ohm
Lead Channel Impedance Value: 456 Ohm
Lead Channel Impedance Value: 456 Ohm
Lead Channel Impedance Value: 475 Ohm
Lead Channel Impedance Value: 551 Ohm
Lead Channel Impedance Value: 589 Ohm
Lead Channel Impedance Value: 722 Ohm
Lead Channel Impedance Value: 760 Ohm
Lead Channel Impedance Value: 779 Ohm
Lead Channel Impedance Value: 836 Ohm
Lead Channel Impedance Value: 931 Ohm
Lead Channel Pacing Threshold Amplitude: 1 V
Lead Channel Pacing Threshold Amplitude: 2.125 V
Lead Channel Pacing Threshold Pulse Width: 0.4 ms
Lead Channel Pacing Threshold Pulse Width: 0.4 ms
Lead Channel Sensing Intrinsic Amplitude: 0.625 mV
Lead Channel Sensing Intrinsic Amplitude: 0.625 mV
Lead Channel Sensing Intrinsic Amplitude: 5.5 mV
Lead Channel Sensing Intrinsic Amplitude: 5.5 mV
Lead Channel Setting Pacing Amplitude: 2 V
Lead Channel Setting Pacing Amplitude: 2.5 V
Lead Channel Setting Pacing Amplitude: 4 V
Lead Channel Setting Pacing Pulse Width: 0.4 ms
Lead Channel Setting Pacing Pulse Width: 0.4 ms
Lead Channel Setting Sensing Sensitivity: 0.9 mV
Zone Setting Status: 755011
Zone Setting Status: 755011

## 2022-06-27 ENCOUNTER — Ambulatory Visit: Payer: Medicare Other | Attending: Internal Medicine | Admitting: Internal Medicine

## 2022-06-27 ENCOUNTER — Encounter: Payer: Self-pay | Admitting: Internal Medicine

## 2022-06-27 VITALS — BP 100/62 | HR 86 | Ht 73.0 in | Wt 188.0 lb

## 2022-06-27 DIAGNOSIS — Z950 Presence of cardiac pacemaker: Secondary | ICD-10-CM | POA: Diagnosis not present

## 2022-06-27 DIAGNOSIS — I5022 Chronic systolic (congestive) heart failure: Secondary | ICD-10-CM | POA: Diagnosis not present

## 2022-06-27 DIAGNOSIS — I4729 Other ventricular tachycardia: Secondary | ICD-10-CM | POA: Insufficient documentation

## 2022-06-27 DIAGNOSIS — I4819 Other persistent atrial fibrillation: Secondary | ICD-10-CM | POA: Diagnosis not present

## 2022-06-27 DIAGNOSIS — I493 Ventricular premature depolarization: Secondary | ICD-10-CM | POA: Diagnosis not present

## 2022-06-27 DIAGNOSIS — T829XXA Unspecified complication of cardiac and vascular prosthetic device, implant and graft, initial encounter: Secondary | ICD-10-CM | POA: Insufficient documentation

## 2022-06-27 DIAGNOSIS — I255 Ischemic cardiomyopathy: Secondary | ICD-10-CM | POA: Diagnosis not present

## 2022-06-27 NOTE — Progress Notes (Signed)
Patient Care Team: Charlane Ferretti, DO as PCP - General (Internal Medicine) [name], MD as PCP - Cardiology (Cardiology) Duke Salvia, MD as PCP - Electrophysiology (Cardiology) Drema Dallas, DO as Consulting Physician (Neurology)   HPI  [name] is a 89 y.o. male Seen in followup for polymorphic ventricular tachycardia for which he has an ICD implantation 2004 with generator change 2010, 2016.  His device reached ERI and he underwent CRT upgrade that was complicated by blood extravasation from the pocket early post implant.  Also persistent atrial fibrillation which was quite symptomatic and prompted a visit to the A. fib clinic with the plan for anticoagulation and TEE cardioversion.  Currently maintained on Eliquis and dofetilide  No bleeding.  No chest pain.  Shortness of breath much improved.  No edema.  Rare episodes of very brief lightheadedness lasting seconds.  Associate with a sensation of his heart rate is slow.  History of coronary disease with prior CABG mitral valve repair both of which were done at [facility]  PFO closure at that time.       Antiarrhythmics Date Reason stopped  dofetilide 9/20  ongoing   Amiodarone / Tried years ago following CABG stopped but pt does not recall adverse effects      DATE TEST EF   6/14 LHC 35-40% LIMA/ RIMA patent   2/17 Echo   30 %   12/18 LHC   LIMA-LAD and right radial graft-OM- patent proximal cx stenosis with negative FFR  11/19 Echo  35-40% LAE Severe (69/3.0/63) Pulm HTN 50  10/20 Echo  35-40% BAE  Pulm NTH 41 mm  4/22 Echo  35-40% BAE  PA sys 45  5/22 CTA-abd  AAA 6.2 <<5.7 cm        Date Cr K Mg Hgb Alk Phos   9/16  0.99 4.2 2.1     12/17  1.08 4.6 2.1     5/19 1.00 4.5 2.2     10/19 1.04 4.4  13.2 (11/19)    1/21  0.9 4.3 1.9 13.4    2/22 1.04 4.5 1.9 12.2<<13.9    6/22 0.93 4.8  14.1 11 1.6  8/23 0.98 5.0      1/24 1.5<,1.2    168 2.5       Device History: STJ dual chamber ICD  implanted 2004 for PMVT, ICM; generator change 2010; gen change 2016 (MDT device) History of appropriate therapy: Yes History of AAD therapy: Yes Thromboembolic risk factors ( age  -2, HTN-1, TIA/CVA-2, Vasc disease -1, CHF-1) for a CHADSVASc Score of >=7   Past Medical History:  Diagnosis Date   Acute on chronic combined systolic (congestive) and diastolic (congestive) heart failure (HCC)    AICD (automatic cardioverter/defibrillator) present    Medtronicn PPM/ICD   Carotid stenosis, right    s/p endarterectomy 11/07/18   Chronic combined systolic (congestive) and diastolic (congestive) heart failure (HCC)    Coronary artery disease    [facility]   Diabetes mellitus    Dysrhythmia    Endocarditis, valve unspecified    Mitral   Headache    hx migraines 6-7 x mo   History of migraine headaches    History of seasonal allergies    ICD (implantable cardiac defibrillator) in place    Optic nerve hemorrhage, 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  We will monitor this closely  and look for changes over the ensuing 6 months.  Repeat visit here in 6 months   PAF (paroxysmal atrial fibrillation) (HCC)    NO LAA occlusion at the time of surgery  [name] 2018   Pleural effusion    PVC's (premature ventricular contractions)    Ventricular tachycardia, polymorphic (HCC)    status post ICD implantation    Past Surgical History:  Procedure Laterality Date   BIV UPGRADE N/A 03/22/2022   Procedure: BIV UPGRADE;  Surgeon: Duke Salvia, MD;  Location: [facility] INVASIVE CV LAB;  Service: Cardiovascular;  Laterality: N/A;   BRONCHIAL BIOPSY  07/22/2020   Procedure: BRONCHIAL BIOPSIES;  Surgeon: Leslye Peer, MD;  Location: [facility] ENDOSCOPY;  Service: Cardiopulmonary;;   BRONCHIAL BRUSHINGS  07/22/2020   Procedure: BRONCHIAL BRUSHINGS;  Surgeon: Leslye Peer, MD;  Location: [facility] ENDOSCOPY;   Service: Cardiopulmonary;;   BRONCHIAL WASHINGS  07/22/2020   Procedure: BRONCHIAL WASHINGS;  Surgeon: Leslye Peer, MD;  Location: [facility] ENDOSCOPY;  Service: Cardiopulmonary;;   CARDIAC CATHETERIZATION  04/03/2007   EF 40%   CARDIAC CATHETERIZATION  02/06/2006   EF 45%. ANTERIOR HYPOKINESIS   CARDIAC CATHETERIZATION  05/29/2000   EF 50%. MILD ANTERIOR HYPOKINESIS   CARDIAC CATHETERIZATION  10/25/1999   EF 50%. SEVERE MITRAL REGURGITATION   CARDIAC CATHETERIZATION  01/06/2003   EF 50%   CARDIAC DEFIBRILLATOR PLACEMENT     CARDIOVERSION N/A 02/07/2019   Procedure: CARDIOVERSION;  Surgeon: Pricilla Riffle, MD;  Location: [facility] ENDOSCOPY;  Service: Cardiovascular;  Laterality: N/A;   CARDIOVERSION N/A 11/24/2019   Procedure: CARDIOVERSION;  Surgeon: Wendall Stade, MD;  Location: [facility] ENDOSCOPY;  Service: Cardiovascular;  Laterality: N/A;   CARDIOVERSION N/A 02/15/2022   Procedure: CARDIOVERSION;  Surgeon: Jake Bathe, MD;  Location: [facility] ENDOSCOPY;  Service: Cardiovascular;  Laterality: N/A;   CARDIOVERSION N/A 03/03/2022   Procedure: CARDIOVERSION;  Surgeon: Parke Poisson, MD;  Location: [facility] ENDOSCOPY;  Service: Cardiovascular;  Laterality: N/A;   CATARACT EXTRACTION W/ INTRAOCULAR LENS  IMPLANT, BILATERAL     CHOLECYSTECTOMY N/A 01/08/2019   Procedure: LAPAROSCOPIC CHOLECYSTECTOMY WITH INTRAOPERATIVE CHOLANGIOGRAM;  Surgeon: Manus Rudd, MD;  Location: [facility] OR;  Service: General;  Laterality: N/A;   CORONARY ARTERY BYPASS GRAFT  2001   2 VESSEL CABG AND MITRAL VALVE REPAIR. HE HAD A LIMA GRAFT TO THE LAD AND RADIAL ARTERY  GRAFT TO THE OBTUSE MARGINAL  OF LEFT CIRCUMFLEX   CORONARY PRESSURE/FFR STUDY N/A 01/05/2017   Procedure: INTRAVASCULAR PRESSURE WIRE/FFR STUDY;  Surgeon: [name], MD;  Location: [facility] INVASIVE CV LAB;  Service: Cardiovascular;  Laterality: N/A;   ENDARTERECTOMY Right 11/07/2018   Procedure: ENDARTERECTOMY CAROTID RIGHT;  Surgeon: Maeola Harman, MD;  Location: [facility]  OR;  Service: Vascular;  Laterality: Right;   EP IMPLANTABLE DEVICE N/A 11/06/2014   Procedure: ICD Generator Changeout;  Surgeon: Duke Salvia, MD;  Location: [facility] INVASIVE CV LAB;  Service: Cardiovascular;  Laterality: N/A;   ERCP N/A 01/10/2019   Procedure: ENDOSCOPIC RETROGRADE CHOLANGIOPANCREATOGRAPHY (ERCP);  Surgeon: Vida Rigger, MD;  Location: [facility] ENDOSCOPY;  Service: Endoscopy;  Laterality: N/A;   FOREIGN BODY REMOVAL Right 06/21/2017   Procedure: FOREIGN BODY REMOVAL ADULT RIGHT RING FINGER;  Surgeon: Betha Loa, MD;  Location: [facility] OR;  Service: Orthopedics;  Laterality: Right;   HEMORRHOID SURGERY     HEMORROIDECTOMY     KNEE ARTHROSCOPY     RIGHT KNEE   LAPAROSCOPIC APPENDECTOMY N/A 01/08/2019   Procedure: APPENDECTOMY LAPAROSCOPIC;  Surgeon: Manus Rudd, MD;  Location: [facility] OR;  Service: General;  Laterality: N/A;   LEFT HEART CATHETERIZATION WITH CORONARY ANGIOGRAM N/A 07/04/2012   Procedure: LEFT HEART CATHETERIZATION WITH CORONARY ANGIOGRAM;  Surgeon: [name], MD;  Location: [facility] CATH LAB;  Service: Cardiovascular;  Laterality: N/A;   MITRAL VALVE REPLACEMENT     No LAA occlusion at the time of surgery  Tamela Oddi report 2018   REMOVAL OF STONES  01/10/2019   Procedure: REMOVAL OF STONES;  Surgeon: Vida Rigger, MD;  Location: [facility] ENDOSCOPY;  Service: Endoscopy;;   RIGHT/LEFT HEART CATH AND CORONARY/GRAFT ANGIOGRAPHY N/A 01/05/2017   Procedure: RIGHT/LEFT HEART CATH AND CORONARY/GRAFT ANGIOGRAPHY;  Surgeon: [name], MD;  Location: [facility] INVASIVE CV LAB;  Service: Cardiovascular;  Laterality: N/A;   SPHINCTEROTOMY  01/10/2019   Procedure: SPHINCTEROTOMY;  Surgeon: Vida Rigger, MD;  Location: [facility] ENDOSCOPY;  Service: Endoscopy;;   TRANSTHORACIC ECHOCARDIOGRAM  04/02/2007   EF 35%   VIDEO BRONCHOSCOPY Right 07/22/2020   Procedure: VIDEO BRONCHOSCOPY WITH FLUORO, BAL AND BIOPSY;  Surgeon: Leslye Peer, MD;  Location: [facility] ENDOSCOPY;  Service: Cardiopulmonary;  Laterality: Right;   CONSCIOUS SEDATION OR MAC    Current Outpatient Medications  Medication Sig Dispense Refill   albuterol (VENTOLIN HFA) 108 (90 Base) MCG/ACT inhaler Inhale 2 puffs into the lungs every 6 (six) hours as needed for wheezing or shortness of breath. 8 g 5   atorvastatin (LIPITOR) 80 MG tablet TAKE ONE TABLET BY MOUTH EVERY DAY FOR CHOLESTEROL 90 tablet 3   cetirizine (ZYRTEC) 10 MG tablet Take 10 mg by mouth daily.     ELIQUIS 5 MG TABS tablet TAKE ONE TABLET BY MOUTH TWICE DAILY 180 tablet 1   empagliflozin (JARDIANCE) 10 MG TABS tablet Take 10 mg by mouth in the morning.     EPIPEN 2-PAK 0.3 MG/0.3ML SOAJ injection Inject 0.3 mg into the muscle daily as needed for anaphylaxis.      Erenumab-aooe (AIMOVIG) 140 MG/ML SOAJ Inject 140 mg into the skin every 28 (twenty-eight) days. 1.12 mL 11   fluticasone (FLONASE) 50 MCG/ACT nasal spray Place 1 spray into the nose daily as needed for rhinitis or allergies.     ibuprofen (ADVIL) 200 MG tablet Take 400 mg by mouth every 8 (eight) hours as needed (headaches/migraine).     Magnesium 250 MG TABS Take 250 mg by mouth in the morning.     metoprolol succinate (TOPROL-XL) 25 MG 24 hr tablet TAKE ONE TABLET BY MOUTH TWICE DAILY WITH OR IMMEDIATELY FOLLOWING A MEAL 180 tablet 3   Multiple Vitamin (MULTIVITAMIN WITH MINERALS) TABS tablet Take 1 tablet by mouth at bedtime. Centrum Silver     Multiple Vitamins-Minerals (PRESERVISION AREDS 2) CAPS Take 1 capsule by mouth daily.     nateglinide (STARLIX) 60 MG tablet Take 60 mg by mouth 2 (two) times daily with a meal.     ONETOUCH VERIO test strip SMARTSIG:Via Meter 1-2 Times Daily     sitaGLIPtin (JANUVIA) 100 MG tablet Take 100 mg by mouth at bedtime.     Tiotropium Bromide-Olodaterol (STIOLTO RESPIMAT) 2.5-2.5 MCG/ACT AERS Inhale 2 puffs into the lungs at bedtime. (Patient taking differently: Inhale 2 puffs into the lungs every morning.)     No current facility-administered medications for this visit.     Allergies  Allergen Reactions   Bee Venom Anaphylaxis   Latex Other (See Comments)    REDNESS AT REACTION SITE.   Metformin Diarrhea   Rivaroxaban Other (See  Comments)    Headaches, blurred vision   Sotalol Other (See Comments)    "BLUE TOES"- cut his circulation off   Levofloxacin     long QT syndrome, so avoid   Warfarin Sodium Other (See Comments)    migraine headaches/vision impariment    Review of Systems negative except from HPI and PMH  Physical Exam  BP 100/62   Pulse 86   Ht 6\' 1"  (1.854 m)   Wt 188 lb (85.3 kg)   SpO2 97%   BMI 24.80 kg/m  Well developed and well nourished in no acute distress HENT normal Neck supple with JVP-flat Clear Device pocket well healed; without hematoma or erythema.  There is no tethering  Regular rate and rhythm, no  gallop No murmur Abd-soft with active BS No Clubbing cyanosis   edema Skin-warm and dry A & Oriented  Grossly normal sensory and motor function  ECG atrial fibrillation V pacing upright QRS lead V1 and rSR prime lead V1  Device function is normal. Programming changes outputs from 4/0.4>>2.5/1.0 msec See Paceart for details     Assessment and  Plan   Ischemic heart disease with prior bypass ejection 35-40%  Congestive heart failure-chronic-systolic  CRT-P with downgrade from ICD   PVCs//Ventricular tachycardia nonsustained     Atrial fibrillation persistent/recurrent  COPD-reactive airways disease  Lightheaded spells?  Nonsustained VT   Much much improved following CRT upgrade.  Will defer follow-up echo to Dr. Excell Seltzer. Some PVCs.  And lightheadedness.  Suspect that they are related.

## 2022-06-27 NOTE — Patient Instructions (Signed)
Medication Instructions:  Your physician recommends that you continue on your current medications as directed. Please refer to the Current Medication list given to you today.  *If you need a refill on your cardiac medications before your next appointment, please call your pharmacy*   Lab Work: None ordered.  If you have labs (blood work) drawn today and your tests are completely normal, you will receive your results only by: MyChart Message (if you have MyChart) OR A paper copy in the mail If you have any lab test that is abnormal or we need to change your treatment, we will call you to review the results.   Testing/Procedures: None ordered.    Follow-Up: At [facility], you and your health needs are our priority.  As part of our continuing mission to provide you with exceptional heart care, we have created designated Provider Care Teams.  These Care Teams include your primary Cardiologist (physician) and Advanced Practice Providers (APPs -  Physician Assistants and Nurse Practitioners) who all work together to provide you with the care you need, when you need it.  We recommend signing up for the patient portal called "MyChart".  Sign up information is provided on this After Visit Summary.  MyChart is used to connect with patients for Virtual Visits (Telemedicine).  Patients are able to view lab/test results, encounter notes, upcoming appointments, etc.  Non-urgent messages can be sent to your provider as well.   To learn more about what you can do with MyChart, go to [email or web address].    Your next appointment:   9 months with Dr Klein 

## 2022-07-11 DIAGNOSIS — E1169 Type 2 diabetes mellitus with other specified complication: Secondary | ICD-10-CM | POA: Diagnosis not present

## 2022-07-11 DIAGNOSIS — R748 Abnormal levels of other serum enzymes: Secondary | ICD-10-CM | POA: Diagnosis not present

## 2022-07-11 DIAGNOSIS — E785 Hyperlipidemia, unspecified: Secondary | ICD-10-CM | POA: Diagnosis not present

## 2022-07-11 DIAGNOSIS — E1165 Type 2 diabetes mellitus with hyperglycemia: Secondary | ICD-10-CM | POA: Diagnosis not present

## 2022-07-11 DIAGNOSIS — E113393 Type 2 diabetes mellitus with moderate nonproliferative diabetic retinopathy without macular edema, bilateral: Secondary | ICD-10-CM | POA: Diagnosis not present

## 2022-07-11 DIAGNOSIS — E559 Vitamin D deficiency, unspecified: Secondary | ICD-10-CM | POA: Diagnosis not present

## 2022-07-11 DIAGNOSIS — N182 Chronic kidney disease, stage 2 (mild): Secondary | ICD-10-CM | POA: Diagnosis not present

## 2022-07-11 DIAGNOSIS — E1159 Type 2 diabetes mellitus with other circulatory complications: Secondary | ICD-10-CM | POA: Diagnosis not present

## 2022-07-11 DIAGNOSIS — E1142 Type 2 diabetes mellitus with diabetic polyneuropathy: Secondary | ICD-10-CM | POA: Diagnosis not present

## 2022-07-11 DIAGNOSIS — R17 Unspecified jaundice: Secondary | ICD-10-CM | POA: Diagnosis not present

## 2022-07-11 DIAGNOSIS — E1122 Type 2 diabetes mellitus with diabetic chronic kidney disease: Secondary | ICD-10-CM | POA: Diagnosis not present

## 2022-07-11 DIAGNOSIS — I129 Hypertensive chronic kidney disease with stage 1 through stage 4 chronic kidney disease, or unspecified chronic kidney disease: Secondary | ICD-10-CM | POA: Diagnosis not present

## 2022-07-12 NOTE — Progress Notes (Signed)
Remote pacemaker transmission.   

## 2022-08-08 DIAGNOSIS — I480 Paroxysmal atrial fibrillation: Secondary | ICD-10-CM | POA: Diagnosis not present

## 2022-08-08 DIAGNOSIS — E1169 Type 2 diabetes mellitus with other specified complication: Secondary | ICD-10-CM | POA: Diagnosis not present

## 2022-08-08 DIAGNOSIS — R748 Abnormal levels of other serum enzymes: Secondary | ICD-10-CM | POA: Diagnosis not present

## 2022-08-08 DIAGNOSIS — R109 Unspecified abdominal pain: Secondary | ICD-10-CM | POA: Diagnosis not present

## 2022-08-08 DIAGNOSIS — R110 Nausea: Secondary | ICD-10-CM | POA: Diagnosis not present

## 2022-08-09 ENCOUNTER — Other Ambulatory Visit: Payer: Self-pay | Admitting: Registered Nurse

## 2022-08-09 ENCOUNTER — Ambulatory Visit
Admission: RE | Admit: 2022-08-09 | Discharge: 2022-08-09 | Disposition: A | Discharge status: Home / Self Care | Payer: Medicare Other | Source: Ambulatory Visit | Attending: Registered Nurse | Admitting: Registered Nurse

## 2022-08-09 DIAGNOSIS — R109 Unspecified abdominal pain: Secondary | ICD-10-CM

## 2022-08-09 DIAGNOSIS — K4090 Unilateral inguinal hernia, without obstruction or gangrene, not specified as recurrent: Secondary | ICD-10-CM | POA: Diagnosis not present

## 2022-08-09 DIAGNOSIS — R110 Nausea: Secondary | ICD-10-CM

## 2022-08-09 DIAGNOSIS — R748 Abnormal levels of other serum enzymes: Secondary | ICD-10-CM

## 2022-08-09 DIAGNOSIS — I7143 Infrarenal abdominal aortic aneurysm, without rupture: Secondary | ICD-10-CM | POA: Diagnosis not present

## 2022-08-10 ENCOUNTER — Ambulatory Visit: Payer: Medicare Other | Admitting: Cardiovascular Disease

## 2022-08-10 ENCOUNTER — Other Ambulatory Visit: Payer: Medicare Other

## 2022-08-11 ENCOUNTER — Other Ambulatory Visit: Payer: Self-pay | Admitting: Internal Medicine

## 2022-08-11 ENCOUNTER — Ambulatory Visit
Admission: RE | Admit: 2022-08-11 | Discharge: 2022-08-11 | Disposition: A | Discharge status: Home / Self Care | Payer: Medicare Other | Source: Ambulatory Visit | Attending: Internal Medicine | Admitting: Internal Medicine

## 2022-08-11 DIAGNOSIS — I517 Cardiomegaly: Secondary | ICD-10-CM | POA: Diagnosis not present

## 2022-08-11 DIAGNOSIS — I714 Abdominal aortic aneurysm, without rupture, unspecified: Secondary | ICD-10-CM

## 2022-08-11 DIAGNOSIS — K4090 Unilateral inguinal hernia, without obstruction or gangrene, not specified as recurrent: Secondary | ICD-10-CM | POA: Diagnosis not present

## 2022-08-11 DIAGNOSIS — K5730 Diverticulosis of large intestine without perforation or abscess without bleeding: Secondary | ICD-10-CM | POA: Diagnosis not present

## 2022-08-11 DIAGNOSIS — I700 Atherosclerosis of aorta: Secondary | ICD-10-CM | POA: Diagnosis not present

## 2022-08-11 MED ORDER — IOPAMIDOL (ISOVUE-370) INJECTION 76%
100.0000 mL | Freq: Once | INTRAVENOUS | Status: AC | PRN
  Administered 2022-08-11: 100 mL via INTRAVENOUS

## 2022-08-14 ENCOUNTER — Ambulatory Visit (HOSPITAL_COMMUNITY): Payer: Medicare Other | Attending: Cardiology

## 2022-08-14 DIAGNOSIS — I7143 Infrarenal abdominal aortic aneurysm, without rupture: Secondary | ICD-10-CM | POA: Insufficient documentation

## 2022-08-14 DIAGNOSIS — I5022 Chronic systolic (congestive) heart failure: Secondary | ICD-10-CM | POA: Diagnosis not present

## 2022-08-14 DIAGNOSIS — E782 Mixed hyperlipidemia: Secondary | ICD-10-CM | POA: Insufficient documentation

## 2022-08-14 DIAGNOSIS — I4819 Other persistent atrial fibrillation: Secondary | ICD-10-CM

## 2022-08-14 DIAGNOSIS — I2510 Atherosclerotic heart disease of native coronary artery without angina pectoris: Secondary | ICD-10-CM | POA: Diagnosis not present

## 2022-08-14 LAB — ECHOCARDIOGRAM COMPLETE
Area-P 1/2: 3.09 cm2
MV VTI: 1.01 cm2
P 1/2 time: 473 msec
S' Lateral: 5.8 cm

## 2022-08-15 ENCOUNTER — Telehealth: Payer: Self-pay | Admitting: Vascular Surgery

## 2022-08-15 NOTE — Telephone Encounter (Signed)
-----   Message from Lemar Livings sent at 08/15/2022  2:36 PM EDT ----- CT scan not normal. Perhaps he would agree to a phone appointment. If he does not then please document his unwillingness to follow up.   Branodn ----- Message ----- From: Leone Haven Sent: 08/15/2022   8:52 AM EDT To: Maeola Harman, MD  Good Morning,  Patient called in this morning to cancel appointment. Per patient he states " his CTA came back normal and does not feel like this appointment is needed."   Kind Regards, Byrd Hesselbach ----- Message ----- From: Maeola Harman, MD Sent: 08/11/2022  10:10 AM EDT To: [facility] Admin Pool; [facility] Charge Pool  I need to see this patient ASAP with CT angio abdomen and pelvis for greater than 6 cm AAA.  Apolinar Junes

## 2022-08-16 ENCOUNTER — Other Ambulatory Visit: Payer: Medicare Other

## 2022-08-16 ENCOUNTER — Encounter: Payer: Medicare Other | Admitting: Vascular Surgery

## 2022-08-16 ENCOUNTER — Ambulatory Visit (INDEPENDENT_AMBULATORY_CARE_PROVIDER_SITE_OTHER): Payer: Medicare Other | Admitting: Vascular Surgery

## 2022-08-16 DIAGNOSIS — I7143 Infrarenal abdominal aortic aneurysm, without rupture: Secondary | ICD-10-CM | POA: Diagnosis not present

## 2022-08-16 NOTE — Progress Notes (Signed)
Virtual Visit via Telephone Note   I connected with BRAYLEE BOSHER on 08/16/2022 using the [employer] by telephone and verified that I was speaking with the correct person using two identifiers. Patient was located at at home and accompanied by his wife. I am located at at the office alone.  Chief Complaint: Abdominal aortic aneurysm  History of Present Illness: [name] is a 89 y.o. male with history of carotid artery disease status post carotid endarterectomy with known abdominal aortic aneurysm.  He is followed by Dr. Excell Seltzer for congestive heart failure has follow-up next week.  He states that right now he does not have shortness of breath and is actually feeling better than previous.  He does have a hernia in the right groin which he was hoping to have fixed and was evaluated but sent for follow-up for his abdominal aortic aneurysm.  Back or abdominal pain.  Past Medical History:  Diagnosis Date   Acute on chronic combined systolic (congestive) and diastolic (congestive) heart failure (HCC)    AICD (automatic cardioverter/defibrillator) present    Medtronicn PPM/ICD   Carotid stenosis, right    s/p endarterectomy 11/07/18   Chronic combined systolic (congestive) and diastolic (congestive) heart failure (HCC)    Coronary artery disease    [facility]   Diabetes mellitus    Dysrhythmia    Endocarditis, valve unspecified    Mitral   Headache    hx migraines 6-7 x mo   History of migraine headaches    History of seasonal allergies    ICD (implantable cardiac defibrillator) in place    Optic nerve hemorrhage, 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  We will monitor this closely and look for changes over the ensuing 6 months.  Repeat visit here in 6 months   PAF (paroxysmal atrial fibrillation) (HCC)    NO LAA occlusion at the time of surgery  [name] 2018    Pleural effusion    PVC's (premature ventricular contractions)    Ventricular tachycardia, polymorphic (HCC)    status post ICD implantation    Past Surgical History:  Procedure Laterality Date   BIV UPGRADE N/A 03/22/2022   Procedure: BIV UPGRADE;  Surgeon: Duke Salvia, MD;  Location: [facility] INVASIVE CV LAB;  Service: Cardiovascular;  Laterality: N/A;   BRONCHIAL BIOPSY  07/22/2020   Procedure: BRONCHIAL BIOPSIES;  Surgeon: Leslye Peer, MD;  Location: [facility] ENDOSCOPY;  Service: Cardiopulmonary;;   BRONCHIAL BRUSHINGS  07/22/2020   Procedure: BRONCHIAL BRUSHINGS;  Surgeon: Leslye Peer, MD;  Location: [facility] ENDOSCOPY;  Service: Cardiopulmonary;;   BRONCHIAL WASHINGS  07/22/2020   Procedure: BRONCHIAL WASHINGS;  Surgeon: Leslye Peer, MD;  Location: [facility] ENDOSCOPY;  Service: Cardiopulmonary;;   CARDIAC CATHETERIZATION  04/03/2007   EF 40%   CARDIAC CATHETERIZATION  02/06/2006   EF 45%. ANTERIOR HYPOKINESIS   CARDIAC CATHETERIZATION  05/29/2000   EF 50%. MILD ANTERIOR HYPOKINESIS   CARDIAC CATHETERIZATION  10/25/1999   EF 50%. SEVERE MITRAL REGURGITATION   CARDIAC CATHETERIZATION  01/06/2003   EF 50%   CARDIAC DEFIBRILLATOR PLACEMENT     CARDIOVERSION N/A 02/07/2019   Procedure: CARDIOVERSION;  Surgeon: Pricilla Riffle, MD;  Location: [facility] ENDOSCOPY;  Service: Cardiovascular;  Laterality: N/A;   CARDIOVERSION N/A 11/24/2019   Procedure: CARDIOVERSION;  Surgeon: Wendall Stade, MD;  Location: [facility] ENDOSCOPY;  Service: Cardiovascular;  Laterality: N/A;   CARDIOVERSION N/A 02/15/2022   Procedure: CARDIOVERSION;  Surgeon: Jake Bathe, MD;  Location: [facility] ENDOSCOPY;  Service: Cardiovascular;  Laterality: N/A;   CARDIOVERSION N/A 03/03/2022   Procedure: CARDIOVERSION;  Surgeon: Parke Poisson, MD;  Location: [facility] ENDOSCOPY;  Service: Cardiovascular;  Laterality: N/A;   CATARACT EXTRACTION W/ INTRAOCULAR LENS  IMPLANT, BILATERAL     CHOLECYSTECTOMY N/A 01/08/2019   Procedure: LAPAROSCOPIC CHOLECYSTECTOMY  WITH INTRAOPERATIVE CHOLANGIOGRAM;  Surgeon: Manus Rudd, MD;  Location: [facility] OR;  Service: General;  Laterality: N/A;   CORONARY ARTERY BYPASS GRAFT  2001   2 VESSEL CABG AND MITRAL VALVE REPAIR. HE HAD A LIMA GRAFT TO THE LAD AND RADIAL ARTERY  GRAFT TO THE OBTUSE MARGINAL  OF LEFT CIRCUMFLEX   CORONARY PRESSURE/FFR STUDY N/A 01/05/2017   Procedure: INTRAVASCULAR PRESSURE WIRE/FFR STUDY;  Surgeon: [name], MD;  Location: [facility] INVASIVE CV LAB;  Service: Cardiovascular;  Laterality: N/A;   ENDARTERECTOMY Right 11/07/2018   Procedure: ENDARTERECTOMY CAROTID RIGHT;  Surgeon: Maeola Harman, MD;  Location: [facility] OR;  Service: Vascular;  Laterality: Right;   EP IMPLANTABLE DEVICE N/A 11/06/2014   Procedure: ICD Generator Changeout;  Surgeon: Duke Salvia, MD;  Location: [facility] INVASIVE CV LAB;  Service: Cardiovascular;  Laterality: N/A;   ERCP N/A 01/10/2019   Procedure: ENDOSCOPIC RETROGRADE CHOLANGIOPANCREATOGRAPHY (ERCP);  Surgeon: Vida Rigger, MD;  Location: [facility] ENDOSCOPY;  Service: Endoscopy;  Laterality: N/A;   FOREIGN BODY REMOVAL Right 06/21/2017   Procedure: FOREIGN BODY REMOVAL ADULT RIGHT RING FINGER;  Surgeon: Betha Loa, MD;  Location: [facility] OR;  Service: Orthopedics;  Laterality: Right;   HEMORRHOID SURGERY     HEMORROIDECTOMY     KNEE ARTHROSCOPY     RIGHT KNEE   LAPAROSCOPIC APPENDECTOMY N/A 01/08/2019   Procedure: APPENDECTOMY LAPAROSCOPIC;  Surgeon: Manus Rudd, MD;  Location: [facility] OR;  Service: General;  Laterality: N/A;   LEFT HEART CATHETERIZATION WITH CORONARY ANGIOGRAM N/A 07/04/2012   Procedure: LEFT HEART CATHETERIZATION WITH CORONARY ANGIOGRAM;  Surgeon: [name], MD;  Location: [facility] CATH LAB;  Service: Cardiovascular;  Laterality: N/A;   MITRAL VALVE REPLACEMENT     No LAA occlusion at the time of surgery  Tamela Oddi report 2018   REMOVAL OF STONES  01/10/2019   Procedure: REMOVAL OF STONES;  Surgeon: Vida Rigger, MD;  Location: [facility] ENDOSCOPY;  Service: Endoscopy;;    RIGHT/LEFT HEART CATH AND CORONARY/GRAFT ANGIOGRAPHY N/A 01/05/2017   Procedure: RIGHT/LEFT HEART CATH AND CORONARY/GRAFT ANGIOGRAPHY;  Surgeon: [name], MD;  Location: [facility] INVASIVE CV LAB;  Service: Cardiovascular;  Laterality: N/A;   SPHINCTEROTOMY  01/10/2019   Procedure: SPHINCTEROTOMY;  Surgeon: Vida Rigger, MD;  Location: [facility] ENDOSCOPY;  Service: Endoscopy;;   TRANSTHORACIC ECHOCARDIOGRAM  04/02/2007   EF 35%   VIDEO BRONCHOSCOPY Right 07/22/2020   Procedure: VIDEO BRONCHOSCOPY WITH FLUORO, BAL AND BIOPSY;  Surgeon: Leslye Peer, MD;  Location: [facility] ENDOSCOPY;  Service: Cardiopulmonary;  Laterality: Right;  CONSCIOUS SEDATION OR MAC   12 system ROS was negative unless otherwise noted in HPI   Observations/Objective: Patient demonstrates good understanding of our conversation    CT IMPRESSION: 1. Enlarging abdominal aortic aneurysm measuring 6.7 x 6.6 cm in transaxial dimension (previously measured up to 6.2 cm on 06/21/2020). High-density material in the periphery of the mural thrombus may be related to calcification however active mural hemorrhage is not excluded. No evidence of rupture at this time. Vascular surgery consultation is recommended. 2. Colonic diverticulosis  without evidence of acute diverticulitis. 3. Non-compromised loops of small bowel extend into patient's right inguinal hernia. No evidence of bowel obstruction. 4. Aortic atherosclerosis (ICD10-I70.0). 5. Cardiomegaly.  Assessment and Plan: 89 year old male with history of carotid disease status post endarterectomy with enlarging abdominal aortic aneurysm now measuring upwards of 6.7 cm.  I reviewed his CT scan today and discussed the results with him and have quoted him approximately 25% risk of rupture within the year.  Patient does have follow-up with Dr. Earmon Phoenix office next week and I will see him in our office to further discuss surgical options after that.  We discussed the signs and symptoms of  rupture he demonstrates good understanding and sure that I will see him after his cardiology follow-up in a couple weeks.   I spent 11 minutes with the patient via telephone encounter.   Signed, Lemar Livings [facility] Office: [phone]  08/16/2022, 4:19 PM

## 2022-08-21 ENCOUNTER — Encounter: Payer: Self-pay | Admitting: Cardiovascular Disease

## 2022-08-27 NOTE — Telephone Encounter (Signed)
Delphia Grates - you are seeing him soon in the office. He has an enlarging AAA and sounds like he needs EVAR. As long as he's well-compensated, I would clear him for surgery, understanding that he is at increased risk with his HF and reduced EF. Let me know if any concerns when you see him.  Thx Kathlene November

## 2022-09-10 DIAGNOSIS — I059 Rheumatic mitral valve disease, unspecified: Secondary | ICD-10-CM | POA: Insufficient documentation

## 2022-09-10 NOTE — Progress Notes (Unsigned)
Cardiology Office Note:    Date:  09/11/2022  ID:  [name], DOB 1931-07-27, MRN [id number] PCP: Charlane Ferretti, DO   [facility]Care Providers Cardiologist:  [name], MD Electrophysiologist:  Sherryl Manges, MD       Patient Profile:      Coronary artery disease  S/p CABG Cath 12/18: oLAD 50, pLAD 50, oD1 50; oLCx 60, OM2 95; RCA irregs; R Radial-OM2 and L-LAD patent >> Med rx Mitral valve disease S/p MV repair at time of CABG Persistent atrial fibrillation Dofetilide Rx, Apixaban Rx  (HFrEF) heart failure with reduced ejection fraction  TTE 10/2018: EF 35-40 TTE 05/06/20: EF 35-40, global HK, mild LVH, mildly reduced RVSF, RVSP 45.5 (mod elevated pulmonary artery pressure), severe BAE, trivial MR, mild MS (mean 3 mmHg), mild to mod TR, mild AV calcification, dilated Ao root (40 mm), mild dilation of ascending aorta (37 mm)  TTE 03/07/22: EF 20-25, GLS -4.3, severely reduced RVSF, mod pulm hypertension, RVSF 45.2, severe BAE, mild MR, mild MS, mean MV 4 mmHg, severe TR, RAP 15 TTE 08/14/22: EF 20-25, global HK, mod LVH, severely reduced RVSF, mild pul hypertension, severe BAE, mild MR, mild MS, severe TR, trivial AI, mod PI, RAP 15 Pulmonary hypertension   Echocardiogram 4/22: RVSP 45.5  Ventricular tachycardia  S/p ICD  S/p ? to CRT-P in 03/2022 Carotid artery disease S/p R CEA  [name] 08/29/19: Bilat ICA 1-39 Abdominal aortic aneurysm  CT 07/2022: 6.7 cm (followed by vasc surg)  Hypertension  Diabetes mellitus  Chronic kidney disease  Chronic Obstructive Pulmonary Disease  Hx of CVA          Discussed the use of AI scribe software for clinical note transcription with the patient, who gave verbal consent to proceed. History of Present Illness   The patient, an 89 year old male with a history of heart failure with reduced ejection fraction, coronary artery disease, mitral valve disease status post mitral valve repair, and atrial fibrillation, presents for surgical  clearance for the repair of an abdominal aortic aneurysm. The aneurysm has enlarged to 6.7 cm and he may need to undergo EVAR. The patient is here today with his wife and daughter. He reports significant improvement in heart failure symptoms following a transition from ICD to CRT-P therapy. He notes shortness of breath with strenuous activities. The patient can walk a block or two without stopping but reports that it is painful due to knee problems. The patient's daughter notes that he gets winded when shopping and uses a cart for support. He can go up a flight of stairs or vacuum a small room. He has not had chest pain, orthopnea, leg edema, syncope.   The patient's daughter also reports a noticeable decline in his memory. The patient repeats things several times a day and has difficulty understanding phone calls from the doctor's office.      ROS: see HPI No melena, hematochezia, hematuria.    Studies Reviewed:        Risk Assessment/Calculations:    CHA2DS2-VASc Score = 7   This indicates a 11.2% annual risk of stroke. The patient's score is based upon: CHF History: 1 HTN History: 0 Diabetes History: 1 Stroke History: 2 Vascular Disease History: 1 Age Score: 2 Gender Score: 0            Physical Exam:   VS:  BP 110/60   Pulse 98   Ht 6\' 1"  (1.854 m)   Wt 195 lb 3.2 oz (88.5 kg)  SpO2 93%   BMI 25.75 kg/m    Wt Readings from Last 3 Encounters:  09/11/22 195 lb 3.2 oz (88.5 kg)  06/27/22 188 lb (85.3 kg)  05/24/22 185 lb 3.2 oz (84 kg)    Constitutional:      Appearance: Healthy appearance. Not in distress.  Neck:     Vascular: No JVR. JVD normal.  Pulmonary:     Breath sounds: No wheezing. No rales.  Cardiovascular:     Normal rate. Regular rhythm.     Murmurs: There is no murmur.  Edema:    Peripheral edema absent.  Abdominal:     Palpations: Abdomen is soft.  Skin:    General: Skin is warm and dry.      Assessment and Plan:  Preoperative cardiovascular  examination Mr. Ratterree's perioperative risk of a major cardiac event is 11% according to the Revised Cardiac Risk Index (RCRI). Therefore, he is at high, but not prohibitive, risk for perioperative complications. His functional capacity is fair at 4.3 METs according to the Duke Activity Status Index (DASI). Recommendations:  According to ACC/AHA guidelines, no further cardiovascular testing needed.  The patient may proceed to surgery at elevated but acceptable risk. He and his family plan to discuss risks and benefits of AAA repair with Dr. Randie Heinz prior to making a decision. I will update Dr. Excell Seltzer on his condition and review further with our pharmacy team to determine how long to hold Eliquis.  ADDENDUM 09/13/2022: Reviewed with PharmD. Ok to hold Eliquis for 2 days prior to surgery and resume as soon as possible post op.  HFrEF (heart failure with reduced ejection fraction) (HCC) Improved symptoms following transition to CRT therapy, but still experiences shortness of breath with exertion. EF remains low at 20-25%, NYHA 2b-3. Volume status is current stable. Low BP limits GDMT. -Continue Jardiance 10 mg daily and Toprol XL 25 mg twice daily.  Coronary artery disease with exertional angina (HCC) Status post-CABG in 2001 and cardiac catheterization in 2018 demonstrated patent bypass grafts. No current anginal symptoms. -Continue Lipitor 80 mg daily. -He is not on ASA as he is on Eliquis  Mitral valve disease Status post mitral valve repair. Echocardiogram in July 2024 demonstrated mild mitral regurgitation and mild mitral stenosis. -Continue SBE prophylaxis.  ATRIAL FIBRILLATION He is tolerating anticoagulation. -Based upon weight, creatinine, continue Eliquis 5 mg twice daily.  Memory loss Reports worsening memory, repeats things several times a day, has difficulty recalling conversations. -Recommend earlier follow-up with neurology for evaluation and management.  AICD (automatic  cardioverter/defibrillator) present Follow up with EP as planned.   Pure hypercholesterolemia Labs from primary care reviewed via Care Everywhere. LDL optimal at 64 in 05/2022. Continue Atorvastatin 80 mg once daily.     Dispo:  Return in about 6 months (around 03/14/2023) for Routine Follow Up, w/ Dr. Excell Seltzer.  Signed, Tereso Newcomer, PA-C

## 2022-09-11 ENCOUNTER — Ambulatory Visit: Payer: Medicare Other | Attending: Physician Assistant | Admitting: Physician Assistant

## 2022-09-11 ENCOUNTER — Encounter: Payer: Self-pay | Admitting: Physician Assistant

## 2022-09-11 VITALS — BP 110/60 | HR 98 | Ht 73.0 in | Wt 195.2 lb

## 2022-09-11 DIAGNOSIS — Z01810 Encounter for preprocedural cardiovascular examination: Secondary | ICD-10-CM | POA: Diagnosis not present

## 2022-09-11 DIAGNOSIS — I4819 Other persistent atrial fibrillation: Secondary | ICD-10-CM | POA: Diagnosis not present

## 2022-09-11 DIAGNOSIS — I25118 Atherosclerotic heart disease of native coronary artery with other forms of angina pectoris: Secondary | ICD-10-CM

## 2022-09-11 DIAGNOSIS — I059 Rheumatic mitral valve disease, unspecified: Secondary | ICD-10-CM | POA: Diagnosis not present

## 2022-09-11 DIAGNOSIS — R413 Other amnesia: Secondary | ICD-10-CM | POA: Diagnosis not present

## 2022-09-11 DIAGNOSIS — I5020 Unspecified systolic (congestive) heart failure: Secondary | ICD-10-CM | POA: Diagnosis not present

## 2022-09-11 DIAGNOSIS — Z95810 Presence of automatic (implantable) cardiac defibrillator: Secondary | ICD-10-CM | POA: Diagnosis not present

## 2022-09-11 DIAGNOSIS — E7800 Pure hypercholesterolemia, unspecified: Secondary | ICD-10-CM

## 2022-09-11 NOTE — Patient Instructions (Signed)
Medication Instructions:  Your physician recommends that you continue on your current medications as directed. Please refer to the Current Medication list given to you today.  *If you need a refill on your cardiac medications before your next appointment, please call your pharmacy*  Lab Work: None ordered If you have labs (blood work) drawn today and your tests are completely normal, you will receive your results only by: MyChart Message (if you have MyChart) OR A paper copy in the mail If you have any lab test that is abnormal or we need to change your treatment, we will call you to review the results.  Follow-Up: At [facility], you and your health needs are our priority.  As part of our continuing mission to provide you with exceptional heart care, we have created designated Provider Care Teams.  These Care Teams include your primary Cardiologist (physician) and Advanced Practice Providers (APPs -  Physician Assistants and Nurse Practitioners) who all work together to provide you with the care you need, when you need it.  Your next appointment:   6 month(s)  Provider:   [name], MD

## 2022-09-11 NOTE — Assessment & Plan Note (Addendum)
Status post mitral valve repair. Echocardiogram in July 2024 demonstrated mild mitral regurgitation and mild mitral stenosis. -Continue SBE prophylaxis.

## 2022-09-11 NOTE — Assessment & Plan Note (Signed)
Follow up with EP as planned. 

## 2022-09-11 NOTE — Assessment & Plan Note (Signed)
He is tolerating anticoagulation. -Based upon weight, creatinine, continue Eliquis 5 mg twice daily.

## 2022-09-11 NOTE — Assessment & Plan Note (Signed)
Reports worsening memory, repeats things several times a day, has difficulty recalling conversations. -Recommend earlier follow-up with neurology for evaluation and management.

## 2022-09-11 NOTE — Assessment & Plan Note (Addendum)
Labs from primary care reviewed via Care Everywhere. LDL optimal at 64 in 05/2022. Continue Atorvastatin 80 mg once daily.

## 2022-09-11 NOTE — Assessment & Plan Note (Signed)
Status post-CABG in 2001 and cardiac catheterization in 2018 demonstrated patent bypass grafts. No current anginal symptoms. -Continue Lipitor 80 mg daily. -He is not on ASA as he is on Eliquis

## 2022-09-11 NOTE — Assessment & Plan Note (Addendum)
[name] perioperative risk of a major cardiac event is 11% according to the Revised Cardiac Risk Index (RCRI). Therefore, he is at high, but not prohibitive, risk for perioperative complications. His functional capacity is fair at 4.3 METs according to the Duke Activity Status Index (DASI). Recommendations:  According to ACC/AHA guidelines, no further cardiovascular testing needed.  The patient may proceed to surgery at elevated but acceptable risk. He and his family plan to discuss risks and benefits of AAA repair with Dr. Randie Heinz prior to making a decision. I will update Dr. Excell Seltzer on his condition and review further with our pharmacy team to determine how long to hold Eliquis.

## 2022-09-11 NOTE — Assessment & Plan Note (Signed)
Improved symptoms following transition to CRT therapy, but still experiences shortness of breath with exertion. EF remains low at 20-25%, NYHA 2b-3. Volume status is current stable. Low BP limits GDMT. -Continue Jardiance 10 mg daily and Toprol XL 25 mg twice daily.

## 2022-09-17 ENCOUNTER — Encounter: Payer: Self-pay | Admitting: Internal Medicine

## 2022-09-19 ENCOUNTER — Encounter: Payer: Self-pay | Admitting: Internal Medicine

## 2022-09-20 DIAGNOSIS — E785 Hyperlipidemia, unspecified: Secondary | ICD-10-CM | POA: Diagnosis not present

## 2022-09-20 DIAGNOSIS — Z125 Encounter for screening for malignant neoplasm of prostate: Secondary | ICD-10-CM | POA: Diagnosis not present

## 2022-09-20 DIAGNOSIS — E559 Vitamin D deficiency, unspecified: Secondary | ICD-10-CM | POA: Diagnosis not present

## 2022-09-20 DIAGNOSIS — R7989 Other specified abnormal findings of blood chemistry: Secondary | ICD-10-CM | POA: Diagnosis not present

## 2022-09-20 DIAGNOSIS — E1169 Type 2 diabetes mellitus with other specified complication: Secondary | ICD-10-CM | POA: Diagnosis not present

## 2022-09-21 ENCOUNTER — Ambulatory Visit: Payer: Medicare Other

## 2022-09-21 ENCOUNTER — Encounter: Payer: Self-pay | Admitting: Internal Medicine

## 2022-09-21 DIAGNOSIS — I255 Ischemic cardiomyopathy: Secondary | ICD-10-CM

## 2022-09-21 DIAGNOSIS — R7989 Other specified abnormal findings of blood chemistry: Secondary | ICD-10-CM | POA: Diagnosis not present

## 2022-09-21 LAB — CUP PACEART REMOTE DEVICE CHECK
Battery Remaining Longevity: 100 mo
Battery Voltage: 3.03 V
Brady Statistic RA Percent Paced: 0.75 %
Brady Statistic RV Percent Paced: 86.81 %
Date Time Interrogation Session: 20240821231353
Implantable Lead Connection Status: 753985
Implantable Lead Connection Status: 753985
Implantable Lead Connection Status: 753985
Implantable Lead Implant Date: 20040309
Implantable Lead Implant Date: 20040309
Implantable Lead Implant Date: 20240221
Implantable Lead Location: 753858
Implantable Lead Location: 753859
Implantable Lead Location: 753860
Implantable Lead Model: 158
Implantable Lead Model: 158
Implantable Lead Model: 5076
Implantable Lead Serial Number: 115061
Implantable Pulse Generator Implant Date: 20240221
Lead Channel Impedance Value: 304 Ohm
Lead Channel Impedance Value: 342 Ohm
Lead Channel Impedance Value: 380 Ohm
Lead Channel Impedance Value: 437 Ohm
Lead Channel Impedance Value: 456 Ohm
Lead Channel Impedance Value: 475 Ohm
Lead Channel Impedance Value: 475 Ohm
Lead Channel Impedance Value: 532 Ohm
Lead Channel Impedance Value: 589 Ohm
Lead Channel Impedance Value: 703 Ohm
Lead Channel Impedance Value: 741 Ohm
Lead Channel Impedance Value: 760 Ohm
Lead Channel Impedance Value: 817 Ohm
Lead Channel Impedance Value: 874 Ohm
Lead Channel Pacing Threshold Amplitude: 1 V
Lead Channel Pacing Threshold Amplitude: 1.75 V
Lead Channel Pacing Threshold Pulse Width: 0.4 ms
Lead Channel Pacing Threshold Pulse Width: 1 ms
Lead Channel Sensing Intrinsic Amplitude: 0.5 mV
Lead Channel Sensing Intrinsic Amplitude: 0.5 mV
Lead Channel Sensing Intrinsic Amplitude: 7 mV
Lead Channel Sensing Intrinsic Amplitude: 7 mV
Lead Channel Setting Pacing Amplitude: 2 V
Lead Channel Setting Pacing Amplitude: 2.5 V
Lead Channel Setting Pacing Amplitude: 2.5 V
Lead Channel Setting Pacing Pulse Width: 0.4 ms
Lead Channel Setting Pacing Pulse Width: 1 ms
Lead Channel Setting Sensing Sensitivity: 0.9 mV
Zone Setting Status: 755011
Zone Setting Status: 755011

## 2022-09-27 DIAGNOSIS — J449 Chronic obstructive pulmonary disease, unspecified: Secondary | ICD-10-CM | POA: Diagnosis not present

## 2022-09-27 DIAGNOSIS — I480 Paroxysmal atrial fibrillation: Secondary | ICD-10-CM | POA: Diagnosis not present

## 2022-09-27 DIAGNOSIS — I5022 Chronic systolic (congestive) heart failure: Secondary | ICD-10-CM | POA: Diagnosis not present

## 2022-09-27 DIAGNOSIS — D6869 Other thrombophilia: Secondary | ICD-10-CM | POA: Diagnosis not present

## 2022-09-27 DIAGNOSIS — Z1389 Encounter for screening for other disorder: Secondary | ICD-10-CM | POA: Diagnosis not present

## 2022-09-27 DIAGNOSIS — I4581 Long QT syndrome: Secondary | ICD-10-CM | POA: Diagnosis not present

## 2022-09-27 DIAGNOSIS — R109 Unspecified abdominal pain: Secondary | ICD-10-CM | POA: Diagnosis not present

## 2022-09-27 DIAGNOSIS — D692 Other nonthrombocytopenic purpura: Secondary | ICD-10-CM | POA: Diagnosis not present

## 2022-09-27 DIAGNOSIS — Z1331 Encounter for screening for depression: Secondary | ICD-10-CM | POA: Diagnosis not present

## 2022-09-27 DIAGNOSIS — I700 Atherosclerosis of aorta: Secondary | ICD-10-CM | POA: Diagnosis not present

## 2022-09-27 DIAGNOSIS — Z0000 Encounter for general adult medical examination without abnormal findings: Secondary | ICD-10-CM | POA: Diagnosis not present

## 2022-09-27 DIAGNOSIS — E1169 Type 2 diabetes mellitus with other specified complication: Secondary | ICD-10-CM | POA: Diagnosis not present

## 2022-09-27 DIAGNOSIS — I719 Aortic aneurysm of unspecified site, without rupture: Secondary | ICD-10-CM | POA: Diagnosis not present

## 2022-09-29 NOTE — Progress Notes (Signed)
Remote pacemaker transmission.   

## 2022-10-04 ENCOUNTER — Ambulatory Visit: Payer: Medicare Other | Admitting: Vascular Surgery

## 2022-10-06 ENCOUNTER — Encounter: Payer: Self-pay | Admitting: Internal Medicine

## 2022-11-06 ENCOUNTER — Other Ambulatory Visit (HOSPITAL_COMMUNITY): Payer: Medicare Other

## 2022-11-10 ENCOUNTER — Ambulatory Visit: Payer: Medicare Other | Attending: Physician Assistant | Admitting: Physician Assistant

## 2022-11-10 ENCOUNTER — Encounter: Payer: Self-pay | Admitting: Physician Assistant

## 2022-11-10 ENCOUNTER — Ambulatory Visit
Admission: RE | Admit: 2022-11-10 | Discharge: 2022-11-10 | Disposition: A | Discharge status: Home / Self Care | Payer: Medicare Other | Source: Ambulatory Visit | Attending: Physician Assistant | Admitting: Physician Assistant

## 2022-11-10 VITALS — BP 112/84 | HR 82 | Ht 73.0 in | Wt 202.0 lb

## 2022-11-10 DIAGNOSIS — I2510 Atherosclerotic heart disease of native coronary artery without angina pectoris: Secondary | ICD-10-CM

## 2022-11-10 DIAGNOSIS — I5022 Chronic systolic (congestive) heart failure: Secondary | ICD-10-CM | POA: Diagnosis present

## 2022-11-10 DIAGNOSIS — Z950 Presence of cardiac pacemaker: Secondary | ICD-10-CM

## 2022-11-10 DIAGNOSIS — Z79899 Other long term (current) drug therapy: Secondary | ICD-10-CM | POA: Diagnosis not present

## 2022-11-10 DIAGNOSIS — I4811 Longstanding persistent atrial fibrillation: Secondary | ICD-10-CM

## 2022-11-10 DIAGNOSIS — R0602 Shortness of breath: Secondary | ICD-10-CM | POA: Diagnosis not present

## 2022-11-10 DIAGNOSIS — R079 Chest pain, unspecified: Secondary | ICD-10-CM | POA: Diagnosis not present

## 2022-11-10 DIAGNOSIS — R0609 Other forms of dyspnea: Secondary | ICD-10-CM

## 2022-11-10 DIAGNOSIS — I255 Ischemic cardiomyopathy: Secondary | ICD-10-CM

## 2022-11-10 LAB — CUP PACEART INCLINIC DEVICE CHECK
Battery Remaining Longevity: 97 mo
Battery Voltage: 3.01 V
Brady Statistic RA Percent Paced: 0.96 %
Brady Statistic RV Percent Paced: 86.96 %
Date Time Interrogation Session: 20241011133559
Implantable Lead Connection Status: 753985
Implantable Lead Connection Status: 753985
Implantable Lead Connection Status: 753985
Implantable Lead Implant Date: 20040309
Implantable Lead Implant Date: 20040309
Implantable Lead Implant Date: 20240221
Implantable Lead Location: 753858
Implantable Lead Location: 753859
Implantable Lead Location: 753860
Implantable Lead Model: 158
Implantable Lead Model: 158
Implantable Lead Model: 5076
Implantable Lead Serial Number: 115061
Implantable Pulse Generator Implant Date: 20240221
Lead Channel Impedance Value: 304 Ohm
Lead Channel Impedance Value: 342 Ohm
Lead Channel Impedance Value: 380 Ohm
Lead Channel Impedance Value: 418 Ohm
Lead Channel Impedance Value: 418 Ohm
Lead Channel Impedance Value: 437 Ohm
Lead Channel Impedance Value: 494 Ohm
Lead Channel Impedance Value: 494 Ohm
Lead Channel Impedance Value: 589 Ohm
Lead Channel Impedance Value: 703 Ohm
Lead Channel Impedance Value: 722 Ohm
Lead Channel Impedance Value: 741 Ohm
Lead Channel Impedance Value: 798 Ohm
Lead Channel Impedance Value: 855 Ohm
Lead Channel Pacing Threshold Amplitude: 0.875 V
Lead Channel Pacing Threshold Amplitude: 1.5 V
Lead Channel Pacing Threshold Pulse Width: 0.4 ms
Lead Channel Pacing Threshold Pulse Width: 1 ms
Lead Channel Sensing Intrinsic Amplitude: 0.5 mV
Lead Channel Sensing Intrinsic Amplitude: 1.375 mV
Lead Channel Sensing Intrinsic Amplitude: 5.25 mV
Lead Channel Sensing Intrinsic Amplitude: 7 mV
Lead Channel Setting Pacing Amplitude: 2 V
Lead Channel Setting Pacing Amplitude: 2.5 V
Lead Channel Setting Pacing Amplitude: 2.5 V
Lead Channel Setting Pacing Pulse Width: 0.4 ms
Lead Channel Setting Pacing Pulse Width: 1 ms
Lead Channel Setting Sensing Sensitivity: 0.9 mV
Zone Setting Status: 755011
Zone Setting Status: 755011

## 2022-11-10 NOTE — Progress Notes (Signed)
Cardiology Office Note:  .   Date:  11/10/2022  ID:  [name], DOB 1931/08/22, MRN [id number] PCP: Charlane Ferretti, DO  [facility] Providers Cardiologist:  [name], MD Electrophysiologist:  Sherryl Manges, MD {  History of Present Illness: [name] is a 89 y.o. male w/PMHx of CAD/VHD (s/p CABG/MVR/PFO closure 2001), PMVT > ICD, AFib, p.HTN, AAA, PVD (s/p R CEA), DM, HTN, HLD, COPD  He saw Dr. Graciela Husbands 06/27/22, he was doing well, less SOB, some lightheadedness that the pt felt was low HRs, Dr. Graciela Husbands suspected PVCs as cause, no changes made  He saw Wende Mott, PA-C most recently 09/11/22, volume stable, pending possible AAA surgery, felt to be an acceptable CV risk, no definitive plans made yet  Seems the patient decided not to pursue surgery, Dr. Graciela Husbands reached out via My chart discussion to discuss, pt reported DOE with minimal exertion and was recommended to come in to see if there was any programming changes that could be done to improve this.  Today's visit is scheduled as to look into any device optimization that may improve his exertional capacity  ROS:   He is accompanied by his daughter he denies rest or nocturnal/positional SOB He get winded fairly quickly with minial exertion.  He reports chest congestion, nearly constant slight cough to clear his throat/chest. He has noted since using the Stioloto this is clearly worse  No CP, palpitations or cardiac awareness No dizzy, near syncope or syncope No bleeding or signs of bleeding  Home BP are even lower/better then today   Device information dual chamber ICD implanted 2004 for PMVT, ICM; generator change 2010; gen change 2016, 04/20/2022 >> CRTpacer  (StJude >> MDT device) Secondary prevention  CRT upgrade that was complicated by blood extravasation from the pocket early post implant   Arrhythmia/AAD hx Dofetilide started 2010 (prior to that amiodarone briefly) >> [name] stopped 09/2020 with  persistent AFib  Studies Reviewed: Marland Kitchen    EKG done today and reviewed by myself:  AFib, V paced w/a RBBB morphology, QRS by my measurement  DEVICE interrogation done today with industry support and reviewed by myself Battery and lead measurements are good 86.3% effective BP + VS events with markers only look like AFib w/FVR He also has NSVT logged as fast AV events   Cath 12/18: oLAD 50, pLAD 50, oD1 50; oLCx 60, OM2 95; RCA irregs; R Radial-OM2 and L-LAD patent >> Med rx   TTE 10/2018: EF 35-40 TTE 05/06/20: EF 35-40, global HK, mild LVH, mildly reduced RVSF, RVSP 45.5 (mod elevated pulmonary artery pressure), severe BAE, trivial MR, mild MS (mean 3 mmHg), mild to mod TR, mild AV calcification, dilated Ao root (40 mm), mild dilation of ascending aorta (37 mm)  TTE 03/07/22: EF 20-25, GLS -4.3, severely reduced RVSF, mod pulm hypertension, RVSF 45.2, severe BAE, mild MR, mild MS, mean MV 4 mmHg, severe TR, RAP 15 TTE 08/14/22: EF 20-25, global HK, mod LVH, severely reduced RVSF, mild pul hypertension, severe BAE, mild MR, mild MS, severe TR, trivial AI, mod PI, RAP 15   Risk Assessment/Calculations:    Physical Exam:   VS:  There were no vitals taken for this visit.   Wt Readings from Last 3 Encounters:  09/11/22 195 lb 3.2 oz (88.5 kg)  06/27/22 188 lb (85.3 kg)  05/24/22 185 lb 3.2 oz (84 kg)    GEN: Well nourished, well developed in no acute distress NECK: No  JVD; No carotid bruits CARDIAC: RRR (paced), no murmurs, rubs, gallops RESPIRATORY:  CTA b/l without rales, wheezing or rhonchi  ABDOMEN: Soft, non-tender, non-distended EXTREMITIES:  No edema; No deformity   PPM site: is stable, no thinning, fluctuation, tethering  ASSESSMENT AND PLAN: .    CRT-P  Intact function EKG optimization not done today Programmed on: V sense reponse pacing Conducted AF response  In effort to improve effective BP If not significantly improved, can visit EKG optimization COPD probably  plays some role as well I have advised he talk with his PMD/pulmonary team as well   Longstanding persistent AFib CHA2DS2Vasc is 5, on Eliquis, appropriately dosed 100% burden Generally paced rates  ICM Chronic CHF (systolic) LVEF 20-25%, severe TR Exam does not suggest volume OL OptiVol also looks good Weight  is stable Has had a slow weight gain, he reports 10lbs over the year so far Is diligent about monitoring vitals, weights Will get CXR, BNP  CAD No anginal symptoms C/w Dr. Sondra Barges  6.  AAA 6.7 x 6.6 cm  pt has decided not to purse intervention BP looks goo Reports generally even lower then here at home  7.  Secondary hypercoagulable state 2/2 AFib       Dispo: back in 2 months or so, sooner if needed  Signed, Sheilah Pigeon, PA-C
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
## 2022-11-10 NOTE — Patient Instructions (Signed)
Medication Instructions:   Your physician recommends that you continue on your current medications as directed. Please refer to the Current Medication list given to you today.   *If you need a refill on your cardiac medications before your next appointment, please call your pharmacy*   Lab Work:  BMET BNP AND CBC TODAY    If you have labs (blood work) drawn today and your tests are completely normal, you will receive your results only by: MyChart Message (if you have MyChart) OR A paper copy in the mail If you have any lab test that is abnormal or we need to change your treatment, we will call you to review the results.   Testing/Procedures: A chest x-ray takes a picture of the organs and structures inside the chest, including the heart, lungs, and blood vessels. This test can show several things, including, whether the heart is enlarges; whether fluid is building up in the lungs; and whether pacemaker / defibrillator leads are still in place.   Address: [address] [place], [place], [place] [address] Phone: [phone] Hours:  Monday 6:30?AM-7?PM Tuesday 6:30?AM-7?PM Wednesday 6:30?AM-7?PM Thursday 6:30?AM-7?PM Friday 6:30?AM-7?PM Saturday 6:30?AM-7?PM Sunday 6:30?AM-7?PM    Follow-Up: At [facility], you and your health needs are our priority.  As part of our continuing mission to provide you with exceptional heart care, we have created designated Provider Care Teams.  These Care Teams include your primary Cardiologist (physician) and Advanced Practice Providers (APPs -  Physician Assistants and Nurse Practitioners) who all work together to provide you with the care you need, when you need it.  We recommend signing up for the patient portal called "MyChart".  Sign up information is provided on this After Visit Summary.  MyChart is used to connect with patients for Virtual Visits (Telemedicine).  Patients are able to view lab/test results, encounter notes, upcoming  appointments, etc.  Non-urgent messages can be sent to your provider as well.   To learn more about what you can do with MyChart, go to [email or web address].    Your next appointment:    2 month(s)  Provider:    Francis Dowse, PA-C    Other Instructions

## 2022-11-11 LAB — CBC
Hematocrit: 47.5 % (ref 37.5–51.0)
Hemoglobin: 15.3 g/dL (ref 13.0–17.7)
MCH: 34.5 pg — ABNORMAL HIGH (ref 26.6–33.0)
MCHC: 32.2 g/dL (ref 31.5–35.7)
MCV: 107 fL — ABNORMAL HIGH (ref 79–97)
Platelets: 97 10*3/uL — CL (ref 150–450)
RBC: 4.43 x10E6/uL (ref 4.14–5.80)
RDW: 12.9 % (ref 11.6–15.4)
WBC: 6.8 10*3/uL (ref 3.4–10.8)

## 2022-11-11 LAB — BASIC METABOLIC PANEL
BUN/Creatinine Ratio: 20 (ref 10–24)
BUN: 21 mg/dL (ref 8–27)
CO2: 24 mmol/L (ref 20–29)
Calcium: 9 mg/dL (ref 8.6–10.2)
Chloride: 98 mmol/L (ref 96–106)
Creatinine, Ser: 1.04 mg/dL (ref 0.76–1.27)
Glucose: 211 mg/dL — ABNORMAL HIGH (ref 70–99)
Potassium: 4.7 mmol/L (ref 3.5–5.2)
Sodium: 133 mmol/L — ABNORMAL LOW (ref 134–144)
eGFR: 69 mL/min/{1.73_m2} (ref >59)

## 2022-11-11 LAB — PRO B NATRIURETIC PEPTIDE: NT-Pro BNP: 6091 pg/mL — ABNORMAL HIGH (ref 0–486)

## 2022-11-12 ENCOUNTER — Encounter: Payer: Self-pay | Admitting: Neurology

## 2022-11-13 ENCOUNTER — Telehealth: Payer: Self-pay

## 2022-11-13 ENCOUNTER — Other Ambulatory Visit: Payer: Self-pay | Admitting: Internal Medicine

## 2022-11-13 ENCOUNTER — Encounter: Payer: Self-pay | Admitting: Internal Medicine

## 2022-11-13 ENCOUNTER — Ambulatory Visit: Payer: Medicare Other | Admitting: Cardiovascular Disease

## 2022-11-13 ENCOUNTER — Ambulatory Visit: Payer: Medicare Other | Admitting: Neurology

## 2022-11-13 DIAGNOSIS — D696 Thrombocytopenia, unspecified: Secondary | ICD-10-CM

## 2022-11-13 DIAGNOSIS — I4891 Unspecified atrial fibrillation: Secondary | ICD-10-CM

## 2022-11-13 NOTE — Telephone Encounter (Signed)
The patient has been notified of the result and verbalized understanding.  All questions (if any) were answered. Macie Burows, RN 11/13/2022 4:25 PM   Pt will have repeat labs at next OV on 11/16/22.

## 2022-11-13 NOTE — Telephone Encounter (Signed)
Patient requests appointment with Dr. Graciela Husbands soonest available, advised I would forward this request to Dr. Odessa Fleming staff.

## 2022-11-13 NOTE — Telephone Encounter (Signed)
Patient wrote in on Mychart reporting "The changes to my Pacemaker that Dell made last Friday have caused me to have severe shortness of breath and now including chest pain. I am unable to sleep during the night, struggling to breathe." He report SOB/chest pain happen on exertion and when he is lying down, they improve with sitting up and resting. He denies weight gain or leg swelling, but states all toes on his right foot are "cold and white". He gives HR as 81, BP as 115/81. He denies any upper respiratory symptoms and I did not hear any coughing or sneezing while on phone call.  Discussed with Otilio Saber who advised patient report to ED. Patient adamantly refuses and states "they won't do anything for me there" and "I just want my settings adjusted today". Provided education that settings could be adjusted and DVT ruled out at ED, patient continues to refuse to go to ED. Mardelle Matte states he can come to device clinic on 11/16/22 to have settings adjusted, patient agrees to this appointment. Again reiterated that if symptoms worsen or if SOB/chest pain do not get better with rest, he needs to call 911. Patient verbalizes understanding.

## 2022-11-14 ENCOUNTER — Other Ambulatory Visit: Payer: Self-pay | Admitting: Internal Medicine

## 2022-11-14 ENCOUNTER — Encounter: Payer: Self-pay | Admitting: Internal Medicine

## 2022-11-16 ENCOUNTER — Ambulatory Visit: Payer: Medicare Other

## 2022-11-16 ENCOUNTER — Ambulatory Visit: Payer: Medicare Other | Attending: Cardiology

## 2022-11-16 DIAGNOSIS — Z950 Presence of cardiac pacemaker: Secondary | ICD-10-CM

## 2022-11-16 DIAGNOSIS — I5022 Chronic systolic (congestive) heart failure: Secondary | ICD-10-CM

## 2022-11-16 LAB — CUP PACEART INCLINIC DEVICE CHECK
Date Time Interrogation Session: 20241017163022
Implantable Lead Connection Status: 753985
Implantable Lead Connection Status: 753985
Implantable Lead Connection Status: 753985
Implantable Lead Implant Date: 20040309
Implantable Lead Implant Date: 20040309
Implantable Lead Implant Date: 20240221
Implantable Lead Location: 753858
Implantable Lead Location: 753859
Implantable Lead Location: 753860
Implantable Lead Model: 158
Implantable Lead Model: 158
Implantable Lead Model: 5076
Implantable Lead Serial Number: 115061
Implantable Pulse Generator Implant Date: 20240221

## 2022-11-16 NOTE — Patient Instructions (Addendum)
Please send me another transmission next Tuesday November 21, 2022.  We will come up with a follow up plan!  Boneta Lucks RN

## 2022-11-16 NOTE — Progress Notes (Signed)
Pt seen in device clinic for reprogramming.  Last device changes programmed off.  Reprogrammed Pt VVIR 75.    Pt in chronic afib-appears heart rates have increased that appear to be interfering with effective BIV pacing.    Will see if increased LRL helps to improve BIV pacing.  Will follow up with Pt next week.

## 2022-11-20 ENCOUNTER — Encounter: Payer: Self-pay | Admitting: Internal Medicine

## 2022-11-21 ENCOUNTER — Ambulatory Visit: Payer: Medicare Other | Attending: Internal Medicine | Admitting: Internal Medicine

## 2022-11-21 DIAGNOSIS — I4811 Longstanding persistent atrial fibrillation: Secondary | ICD-10-CM | POA: Insufficient documentation

## 2022-11-21 DIAGNOSIS — I5023 Acute on chronic systolic (congestive) heart failure: Secondary | ICD-10-CM | POA: Diagnosis not present

## 2022-11-21 MED ORDER — DIGOXIN 250 MCG PO TABS: ORAL_TABLET | ORAL | 0 refills | Status: DC

## 2022-11-21 MED ORDER — TORSEMIDE 20 MG PO TABS: 40.0000 mg | ORAL_TABLET | Freq: Every day | ORAL | 3 refills | Status: DC

## 2022-11-21 MED ORDER — DIGOXIN 125 MCG PO TABS: 0.1250 mg | ORAL_TABLET | Freq: Every day | ORAL | 1 refills | Status: DC

## 2022-11-21 NOTE — Progress Notes (Unsigned)
Patient Care Team: Charlane Ferretti, DO as PCP - General (Internal Medicine) [name], MD as PCP - Cardiology (Cardiology) Duke Salvia, MD as PCP - Electrophysiology (Cardiology) Drema Dallas, DO as Consulting Physician (Neurology)   HPI  [name] is a 89 y.o. 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  Functional status improved markedly initially but over recent months has deteriorated again.  He has been reluctant to use diuretics because of the frequency of urination; more recently is also had problems with urinating because he cannot get his meatus quite clear and he ends up spraying urine. Notably over the last 4 months there has been a significant decrease in pacemaker percentage from the nearly 100s to the 70s and 80s.  This seems to be a combination of both PVCs as well as conducted atrial fibrillation.         Records and Results Reviewed***  Past Medical History:  Diagnosis Date   Acute on chronic combined systolic (congestive) and diastolic (congestive) heart failure (HCC)    AICD (automatic cardioverter/defibrillator) present    Medtronicn PPM/ICD   Carotid stenosis, right    s/p endarterectomy 11/07/18   Chronic combined systolic (congestive) and diastolic (congestive) heart failure (HCC)    Coronary artery disease    [facility]   Diabetes mellitus    Dysrhythmia    Endocarditis, valve unspecified    Mitral   Headache    hx migraines 6-7 x mo   History of migraine headaches    History of seasonal allergies    ICD (implantable cardiac defibrillator) in place    Optic nerve hemorrhage, 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  We will monitor this closely and look for changes over the  ensuing 6 months.  Repeat visit here in 6 months   PAF (paroxysmal atrial fibrillation) (HCC)    NO LAA occlusion at the time of surgery  [name] 2018   Pleural effusion    PVC's (premature ventricular contractions)    Ventricular tachycardia, polymorphic (HCC)    status post ICD implantation    Past Surgical History:  Procedure Laterality Date   BIV UPGRADE N/A 03/22/2022   Procedure: BIV UPGRADE;  Surgeon: Duke Salvia, MD;  Location: [facility] INVASIVE CV LAB;  Service: Cardiovascular;  Laterality: N/A;   BRONCHIAL BIOPSY  07/22/2020   Procedure: BRONCHIAL BIOPSIES;  Surgeon: Leslye Peer, MD;  Location: [facility] ENDOSCOPY;  Service: Cardiopulmonary;;   BRONCHIAL BRUSHINGS  07/22/2020   Procedure: BRONCHIAL BRUSHINGS;  Surgeon: Leslye Peer, MD;  Location: [facility] ENDOSCOPY;  Service: Cardiopulmonary;;   BRONCHIAL WASHINGS  07/22/2020   Procedure: BRONCHIAL WASHINGS;  Surgeon: Leslye Peer, MD;  Location: [facility] ENDOSCOPY;  Service: Cardiopulmonary;;   CARDIAC CATHETERIZATION  04/03/2007   EF 40%   CARDIAC CATHETERIZATION  02/06/2006   EF 45%. ANTERIOR HYPOKINESIS   CARDIAC CATHETERIZATION  05/29/2000   EF 50%. MILD ANTERIOR HYPOKINESIS   CARDIAC CATHETERIZATION  10/25/1999   EF 50%. SEVERE MITRAL REGURGITATION   CARDIAC CATHETERIZATION  01/06/2003   EF 50%   CARDIAC DEFIBRILLATOR PLACEMENT     CARDIOVERSION N/A 02/07/2019   Procedure: CARDIOVERSION;  Surgeon: Pricilla Riffle, MD;  Location: [facility] ENDOSCOPY;  Service: Cardiovascular;  Laterality: N/A;  CARDIOVERSION N/A 11/24/2019   Procedure: CARDIOVERSION;  Surgeon: Wendall Stade, MD;  Location: [facility] ENDOSCOPY;  Service: Cardiovascular;  Laterality: N/A;   CARDIOVERSION N/A 02/15/2022   Procedure: CARDIOVERSION;  Surgeon: Jake Bathe, MD;  Location: [facility] ENDOSCOPY;  Service: Cardiovascular;  Laterality: N/A;   CARDIOVERSION N/A 03/03/2022   Procedure: CARDIOVERSION;  Surgeon: Parke Poisson, MD;  Location: [facility] ENDOSCOPY;  Service: Cardiovascular;   Laterality: N/A;   CATARACT EXTRACTION W/ INTRAOCULAR LENS  IMPLANT, BILATERAL     CHOLECYSTECTOMY N/A 01/08/2019   Procedure: LAPAROSCOPIC CHOLECYSTECTOMY WITH INTRAOPERATIVE CHOLANGIOGRAM;  Surgeon: Manus Rudd, MD;  Location: [facility] OR;  Service: General;  Laterality: N/A;   CORONARY ARTERY BYPASS GRAFT  2001   2 VESSEL CABG AND MITRAL VALVE REPAIR. HE HAD A LIMA GRAFT TO THE LAD AND RADIAL ARTERY  GRAFT TO THE OBTUSE MARGINAL  OF LEFT CIRCUMFLEX   CORONARY PRESSURE/FFR STUDY N/A 01/05/2017   Procedure: INTRAVASCULAR PRESSURE WIRE/FFR STUDY;  Surgeon: [name], MD;  Location: [facility] INVASIVE CV LAB;  Service: Cardiovascular;  Laterality: N/A;   ENDARTERECTOMY Right 11/07/2018   Procedure: ENDARTERECTOMY CAROTID RIGHT;  Surgeon: Maeola Harman, MD;  Location: [facility] OR;  Service: Vascular;  Laterality: Right;   EP IMPLANTABLE DEVICE N/A 11/06/2014   Procedure: ICD Generator Changeout;  Surgeon: Duke Salvia, MD;  Location: [facility] INVASIVE CV LAB;  Service: Cardiovascular;  Laterality: N/A;   ERCP N/A 01/10/2019   Procedure: ENDOSCOPIC RETROGRADE CHOLANGIOPANCREATOGRAPHY (ERCP);  Surgeon: Vida Rigger, MD;  Location: [facility] ENDOSCOPY;  Service: Endoscopy;  Laterality: N/A;   FOREIGN BODY REMOVAL Right 06/21/2017   Procedure: FOREIGN BODY REMOVAL ADULT RIGHT RING FINGER;  Surgeon: Betha Loa, MD;  Location: [facility] OR;  Service: Orthopedics;  Laterality: Right;   HEMORRHOID SURGERY     HEMORROIDECTOMY     KNEE ARTHROSCOPY     RIGHT KNEE   LAPAROSCOPIC APPENDECTOMY N/A 01/08/2019   Procedure: APPENDECTOMY LAPAROSCOPIC;  Surgeon: Manus Rudd, MD;  Location: [facility] OR;  Service: General;  Laterality: N/A;   LEFT HEART CATHETERIZATION WITH CORONARY ANGIOGRAM N/A 07/04/2012   Procedure: LEFT HEART CATHETERIZATION WITH CORONARY ANGIOGRAM;  Surgeon: [name], MD;  Location: [facility] CATH LAB;  Service: Cardiovascular;  Laterality: N/A;   MITRAL VALVE REPLACEMENT     No LAA occlusion at the time of surgery  Tamela Oddi report 2018   REMOVAL OF STONES  01/10/2019   Procedure: REMOVAL OF STONES;  Surgeon: Vida Rigger, MD;  Location: [facility] ENDOSCOPY;  Service: Endoscopy;;   RIGHT/LEFT HEART CATH AND CORONARY/GRAFT ANGIOGRAPHY N/A 01/05/2017   Procedure: RIGHT/LEFT HEART CATH AND CORONARY/GRAFT ANGIOGRAPHY;  Surgeon: [name], MD;  Location: [facility] INVASIVE CV LAB;  Service: Cardiovascular;  Laterality: N/A;   SPHINCTEROTOMY  01/10/2019   Procedure: SPHINCTEROTOMY;  Surgeon: Vida Rigger, MD;  Location: [facility] ENDOSCOPY;  Service: Endoscopy;;   TRANSTHORACIC ECHOCARDIOGRAM  04/02/2007   EF 35%   VIDEO BRONCHOSCOPY Right 07/22/2020   Procedure: VIDEO BRONCHOSCOPY WITH FLUORO, BAL AND BIOPSY;  Surgeon: Leslye Peer, MD;  Location: [facility] ENDOSCOPY;  Service: Cardiopulmonary;  Laterality: Right;  CONSCIOUS SEDATION OR MAC    Current Meds  Medication Sig   digoxin (LANOXIN) 0.125 MG tablet Take 1 tablet (0.125 mg total) by mouth daily.   digoxin (LANOXIN) 0.25 MG tablet Take 1 tablet by mouth x 3 days.   torsemide (DEMADEX) 20 MG tablet Take 2 tablets (40 mg total) by mouth daily.    Allergies  Allergen Reactions   Bee Venom  Anaphylaxis   Latex Other (See Comments) and Rash    REDNESS AT REACTION SITE.   Metformin Diarrhea   Rivaroxaban Other (See Comments)    Headaches, blurred vision  Other Reaction(s): Dizziness   Sotalol Other (See Comments)    "BLUE TOES"- cut his circulation off   Levofloxacin     long QT syndrome, so avoid   Warfarin Sodium Other (See Comments)    migraine headaches/vision impariment      Review of Systems negative except from HPI and PMH  Physical Exam There were no vitals taken for this visit. Well developed and well nourished in no acute distress HENT normal E scleral and icterus clear Neck Supple JVP flat; carotids brisk and full Clear to ausculation {CARD RHYTHM:10874} ***Regular rate and rhythm, no murmurs gallops or rub Soft with active bowel sounds No  clubbing cyanosis {Numbers; edema:17696} Edema Alert and oriented, grossly normal motor and sensory function Skin Warm and Dry  ECG ***  Estimated Creatinine Clearance: 54.4 mL/min (by C-G formula based on SCr of 1.04 mg/dL).   Assessment and  Plan       Current medicines are reviewed at length with the patient today .  The patient does not*** have concerns regarding medicines.

## 2022-11-21 NOTE — Patient Instructions (Addendum)
Medication Instructions Your physician has recommended you make the following change in your medication:   ** Begin Torsemide 20mg  - 2 tablet by mouth daily  ** Begin Digoxin .25mg  1 tablet by mouth x 3 days then stop and start Digoxin .125mg   1 tablet by mouth daily.   *If you need a refill on your cardiac medications before your next appointment, please call your pharmacy*   Lab Work: None ordered.  If you have labs (blood work) drawn today and your tests are completely normal, you will receive your results only by: MyChart Message (if you have MyChart) OR A paper copy in the mail If you have any lab test that is abnormal or we need to change your treatment, we will call you to review the results.   Testing/Procedures: None ordered.    Follow-Up: At [facility], you and your health needs are our priority.  As part of our continuing mission to provide you with exceptional heart care, we have created designated Provider Care Teams.  These Care Teams include your primary Cardiologist (physician) and Advanced Practice Providers (APPs -  Physician Assistants and Nurse Practitioners) who all work together to provide you with the care you need, when you need it.  We recommend signing up for the patient portal called "MyChart".  Sign up information is provided on this After Visit Summary.  MyChart is used to connect with patients for Virtual Visits (Telemedicine).  Patients are able to view lab/test results, encounter notes, upcoming appointments, etc.  Non-urgent messages can be sent to your provider as well.   To learn more about what you can do with MyChart, go to [email or web address].    Your next appointment:   1 week with Dr Excell Seltzer or Tereso Newcomer, PA-C

## 2022-11-22 ENCOUNTER — Encounter: Payer: Self-pay | Admitting: Internal Medicine

## 2022-11-23 MED ORDER — POTASSIUM CHLORIDE ER 10 MEQ PO TBCR
20.0000 meq | EXTENDED_RELEASE_TABLET | Freq: Every day | ORAL | 3 refills | Status: DC
Start: 2022-11-23 — End: 2022-12-01

## 2022-12-01 ENCOUNTER — Encounter: Payer: Self-pay | Admitting: Cardiovascular Disease

## 2022-12-01 ENCOUNTER — Ambulatory Visit: Payer: Medicare Other | Attending: Cardiovascular Disease | Admitting: Cardiovascular Disease

## 2022-12-01 VITALS — BP 120/72 | HR 63 | Ht 73.0 in | Wt 182.2 lb

## 2022-12-01 DIAGNOSIS — E782 Mixed hyperlipidemia: Secondary | ICD-10-CM

## 2022-12-01 DIAGNOSIS — Z79899 Other long term (current) drug therapy: Secondary | ICD-10-CM | POA: Diagnosis not present

## 2022-12-01 DIAGNOSIS — I4811 Longstanding persistent atrial fibrillation: Secondary | ICD-10-CM | POA: Diagnosis not present

## 2022-12-01 DIAGNOSIS — I2510 Atherosclerotic heart disease of native coronary artery without angina pectoris: Secondary | ICD-10-CM | POA: Diagnosis not present

## 2022-12-01 DIAGNOSIS — I5022 Chronic systolic (congestive) heart failure: Secondary | ICD-10-CM | POA: Diagnosis not present

## 2022-12-01 MED ORDER — POTASSIUM CHLORIDE ER 10 MEQ PO TBCR
10.0000 meq | EXTENDED_RELEASE_TABLET | Freq: Every day | ORAL | 3 refills | Status: AC
Start: 2022-12-01 — End: ?

## 2022-12-01 MED ORDER — TORSEMIDE 20 MG PO TABS
20.0000 mg | ORAL_TABLET | Freq: Every day | ORAL | 3 refills | Status: AC
Start: 2022-12-01 — End: ?

## 2022-12-01 NOTE — Progress Notes (Unsigned)
Cardiology Office Note:    Date:  12/05/2022   ID:  [name], DOB 1931/03/02, MRN [id number]  PCP:  Charlane Ferretti, DO   [facility] Providers Cardiologist:  [name], MD Electrophysiologist:  Sherryl Manges, MD     Referring MD: Charlane Ferretti, DO   Chief Complaint  Patient presents with   Shortness of Breath    History of Present Illness:    [name] is a 89 y.o. male with a hx of chronic heart failure with reduced ejection fraction, persistent atrial fibrillation, and coronary artery disease status post CABG and mitral valve repair remotely.  The patient returns today for follow-up evaluation after recently being seen with acute on chronic heart failure.  His cardiovascular problem list is outlined below:  Coronary artery disease  S/p CABG Cath 12/18: oLAD 50, pLAD 50, oD1 50; oLCx 60, OM2 95; RCA irregs; R Radial-OM2 and L-LAD patent >> Med rx Mitral valve disease S/p MV repair at time of CABG Persistent atrial fibrillation Dofetilide Rx, Apixaban Rx  (HFrEF) heart failure with reduced ejection fraction  TTE 10/2018: EF 35-40 TTE 05/06/20: EF 35-40, global HK, mild LVH, mildly reduced RVSF, RVSP 45.5 (mod elevated pulmonary artery pressure), severe BAE, trivial MR, mild MS (mean 3 mmHg), mild to mod TR, mild AV calcification, dilated Ao root (40 mm), mild dilation of ascending aorta (37 mm)  TTE 03/07/22: EF 20-25, GLS -4.3, severely reduced RVSF, mod pulm hypertension, RVSF 45.2, severe BAE, mild MR, mild MS, mean MV 4 mmHg, severe TR, RAP 15 TTE 08/14/22: EF 20-25, global HK, mod LVH, severely reduced RVSF, mild pul hypertension, severe BAE, mild MR, mild MS, severe TR, trivial AI, mod PI, RAP 15 Pulmonary hypertension          Echocardiogram 4/22: RVSP 45.5  Ventricular tachycardia  S/p ICD  S/p ? to CRT-P in 03/2022 Carotid artery disease S/p R CEA  [name] 08/29/19: Bilat ICA 1-39 Abdominal aortic aneurysm  CT 07/2022: 6.7 cm (followed by vasc  surg)  Hypertension  Diabetes mellitus  Chronic kidney disease  Chronic Obstructive Pulmonary Disease  Hx of CVA   At the time of his recent visit, he was changed from furosemide to torsemide and initially had a very good response to this. He is here with his wife and daughter today. He hasn't had any further issues with edema and continues on torsemide 40 mg daily.  He still short of breath with activity but his breathing is significantly improved.  He has had no recent chest pain or pressure.  He has good days and bad days with his memory.  No other complaints reported today.   Current Medications: Current Meds  Medication Sig   atorvastatin (LIPITOR) 80 MG tablet TAKE ONE TABLET BY MOUTH EVERY DAY FOR CHOLESTEROL   cetirizine (ZYRTEC) 10 MG tablet Take 10 mg by mouth daily.   digoxin (LANOXIN) 0.125 MG tablet Take 1 tablet (0.125 mg total) by mouth daily.   ELIQUIS 5 MG TABS tablet TAKE ONE TABLET BY MOUTH TWICE DAILY   empagliflozin (JARDIANCE) 10 MG TABS tablet Take 10 mg by mouth in the morning.   EPIPEN 2-PAK 0.3 MG/0.3ML SOAJ injection Inject 0.3 mg into the muscle daily as needed for anaphylaxis.    fluticasone (FLONASE) 50 MCG/ACT nasal spray Place 1 spray into the nose daily as needed for rhinitis or allergies.   ibuprofen (ADVIL) 200 MG tablet Take 400 mg by mouth every 8 (eight) hours as needed (  headaches/migraine).   Magnesium 250 MG TABS Take 250 mg by mouth in the morning.   metoprolol succinate (TOPROL-XL) 25 MG 24 hr tablet TAKE ONE TABLET BY MOUTH TWICE DAILY WITH OR immediately following A MEAL   nateglinide (STARLIX) 120 MG tablet Take 120 mg by mouth daily with breakfast. Per patient taking 2 tablets at breakfast   nateglinide (STARLIX) 60 MG tablet Take 60 mg by mouth at bedtime.   ONETOUCH VERIO test strip SMARTSIG:Via Meter 1-2 Times Daily   potassium chloride (KLOR-CON) 10 MEQ tablet Take 1 tablet (10 mEq total) by mouth daily.   sitaGLIPtin (JANUVIA) 100 MG  tablet Take 100 mg by mouth at bedtime.   torsemide (DEMADEX) 20 MG tablet Take 1 tablet (20 mg total) by mouth daily.   [DISCONTINUED] Erenumab-aooe (AIMOVIG) 140 MG/ML SOAJ Inject 140 mg into the skin every 28 (twenty-eight) days.   [DISCONTINUED] potassium chloride (KLOR-CON) 10 MEQ tablet Take 2 tablets (20 mEq total) by mouth daily.   [DISCONTINUED] Tiotropium Bromide-Olodaterol (STIOLTO RESPIMAT) 2.5-2.5 MCG/ACT AERS Inhale 2 puffs into the lungs at bedtime.   [DISCONTINUED] torsemide (DEMADEX) 20 MG tablet Take 2 tablets (40 mg total) by mouth daily.     Allergies:   Bee venom, Latex, Metformin, Rivaroxaban, Sotalol, Levofloxacin, and Warfarin sodium   ROS:   Please see the history of present illness.    All other systems reviewed and are negative.  EKGs/Labs/Other Studies Reviewed:    The following studies were reviewed today: Cardiac Studies & Procedures   CARDIAC CATHETERIZATION  CARDIAC CATHETERIZATION 01/05/2017  Narrative 1. Severe 2 vessel CAD with continued patency of the LIMA-LAD and right radial graft-OM 2. Moderate, severely calcified, proximal circumflex stenosis with negative FFR assessment 3. Normal right heart hemodynamics except for a prominent V wave in the PCWP tracing 4. Normal/low LVEDP  Recommend: ongoing medical therapy. Pt appears to be well-compensated from a hemodynamic perspective.  Findings Coronary Findings Diagnostic  Dominance: Right  Left Anterior Descending Ost LAD to Prox LAD lesion is 50% stenosed. The lesion is moderately calcified. Prox LAD lesion is 50% stenosed.  First Diagonal Branch Ost 1st Diag to 1st Diag lesion is 50% stenosed. The lesion is calcified.  Left Circumflex Ost Cx to Prox Cx lesion is 60% stenosed. The lesion is severely calcified. Eccentric calcified plaque in some views appears moderate-severe and in other views appears widely patent.  Second Obtuse Marginal Branch Ost 2nd Mrg lesion is 95% stenosed.  Right  Coronary Artery Vessel is small. The vessel exhibits minimal luminal irregularities.  Right Radial Artery Graft To 2nd Mrg Right radial artery. Widely patent  LIMA LIMA Graft To Mid LAD LIMA graft was visualized by angiography and is normal in caliber. The graft exhibits no disease. Widely patent, imaged non-selectively  Intervention  No interventions have been documented.     ECHOCARDIOGRAM  ECHOCARDIOGRAM COMPLETE 08/14/2022  Narrative ECHOCARDIOGRAM REPORT    Patient Name:   [name] [facility] Date of Exam: 08/14/2022 Medical Rec #:  [id number]        Height:       73.0 in Accession #:    [id number]       Weight:       188.0 lb Date of Birth:  1931/03/06        BSA:          2.096 m Patient Age:    89 years         BP:  106/76 mmHg Patient Gender: M                HR:           90 bpm. Exam Location:  [address]  Procedure: 2D Echo, 3D Echo, Cardiac Doppler and Color Doppler  Indications:    I48.19 Atrial Fibrillation  History:        Patient has prior history of Echocardiogram examinations, most recent 03/07/2022. CHF, CAD, ICD, Pulmonary HTN, Arrythmias:ventricular Tachycardia; Risk Factors:Hypertension and Diabetes.  Sonographer:    Clearence Ped RCS Referring Phys: [phone] Keviana Guida  IMPRESSIONS   1. Left ventricular ejection fraction, by estimation, is 20 to 25%. The left ventricle has severely decreased function. The left ventricle demonstrates global hypokinesis. The left ventricular internal cavity size was moderately dilated. There is moderate concentric left ventricular hypertrophy. Left ventricular diastolic parameters are indeterminate. 2. Right ventricular systolic function is severely reduced. The right ventricular size is moderately enlarged. There is mildly elevated pulmonary artery systolic pressure. 3. Left atrial size was severely dilated. 4. Right atrial size was severely dilated. 5. The mitral valve is degenerative. Mild mitral valve  regurgitation. Mild mitral stenosis. Moderate to severe mitral annular calcification. 6. Tricuspid valve regurgitation is severe. 7. The aortic valve is tricuspid. There is mild calcification of the aortic valve. Aortic valve regurgitation is trivial. No aortic stenosis is present. 8. Pulmonic valve regurgitation is moderate. 9. The inferior vena cava is dilated in size with <50% respiratory variability, suggesting right atrial pressure of 15 mmHg.  FINDINGS Left Ventricle: Left ventricular ejection fraction, by estimation, is 20 to 25%. The left ventricle has severely decreased function. The left ventricle demonstrates global hypokinesis. The left ventricular internal cavity size was moderately dilated. There is moderate concentric left ventricular hypertrophy. Left ventricular diastolic parameters are indeterminate.  Right Ventricle: The right ventricular size is moderately enlarged. No increase in right ventricular wall thickness. Right ventricular systolic function is severely reduced. There is mildly elevated pulmonary artery systolic pressure. The tricuspid regurgitant velocity is 2.91 m/s, and with an assumed right atrial pressure of 3 mmHg, the estimated right ventricular systolic pressure is 36.9 mmHg.  Left Atrium: Left atrial size was severely dilated.  Right Atrium: Right atrial size was severely dilated.  Pericardium: There is no evidence of pericardial effusion.  Mitral Valve: The mitral valve is degenerative in appearance. Moderate to severe mitral annular calcification. Mild mitral valve regurgitation. Mild mitral valve stenosis. MV peak gradient, 11.6 mmHg. The mean mitral valve gradient is 3.8 mmHg.  Tricuspid Valve: The tricuspid valve is normal in structure. Tricuspid valve regurgitation is severe. No evidence of tricuspid stenosis.  Aortic Valve: The aortic valve is tricuspid. There is mild calcification of the aortic valve. Aortic valve regurgitation is trivial. Aortic  regurgitation PHT measures 473 msec. No aortic stenosis is present.  Pulmonic Valve: The pulmonic valve was normal in structure. Pulmonic valve regurgitation is moderate. No evidence of pulmonic stenosis.  Aorta: The aortic root is normal in size and structure.  Venous: The inferior vena cava is dilated in size with less than 50% respiratory variability, suggesting right atrial pressure of 15 mmHg.  IAS/Shunts: No atrial level shunt detected by color flow Doppler.  Additional Comments: A device lead is visualized.   LEFT VENTRICLE PLAX 2D LVIDd:         6.30 cm   Diastology LVIDs:         5.80 cm   LV e' medial:    6.85  cm/s LV PW:         1.10 cm   LV E/e' medial:  25.1 LV IVS:        1.10 cm   LV e' lateral:   2.93 cm/s LVOT diam:     2.00 cm   LV E/e' lateral: 58.7 LV SV:         40 LV SV Index:   19 LVOT Area:     3.14 cm  3D Volume EF: 3D EF:        19 % LV EDV:       247 ml LV ESV:       201 ml LV SV:        46 ml  RIGHT VENTRICLE RV Basal diam:  4.60 cm RV Mid diam:    4.80 cm RV S prime:     4.81 cm/s TAPSE (M-mode): 1.2 cm RVSP:           36.9 mmHg  LEFT ATRIUM              Index        RIGHT ATRIUM           Index LA diam:        6.30 cm  3.01 cm/m   RA Pressure: 3.00 mmHg LA Vol (A2C):   161.0 ml 76.82 ml/m  RA Area:     30.40 cm LA Vol (A4C):   149.0 ml 71.09 ml/m  RA Volume:   113.00 ml 53.92 ml/m LA Biplane Vol: 162.0 ml 77.30 ml/m AORTIC VALVE LVOT Vmax:   71.10 cm/s LVOT Vmean:  45.133 cm/s LVOT VTI:    0.129 m AI PHT:      473 msec  AORTA Ao Root diam: 3.50 cm Ao Asc diam:  3.50 cm  MITRAL VALVE                TRICUSPID VALVE MV Area (PHT): 3.09 cm     TR Peak grad:   33.9 mmHg MV Area VTI:   1.01 cm     TR Vmax:        291.00 cm/s MV Peak grad:  11.6 mmHg    Estimated RAP:  3.00 mmHg MV Mean grad:  3.8 mmHg     RVSP:           36.9 mmHg MV Vmax:       1.70 m/s MV Vmean:      83.4 cm/s    SHUNTS MV Decel Time: 246 msec     Systemic  VTI:  0.13 m MV E velocity: 172.00 cm/s  Systemic Diam: 2.00 cm  Arvilla Meres MD Electronically signed by Arvilla Meres MD Signature Date/Time: 08/14/2022/2:04:41 PM    Final             EKG:        Recent Labs: 02/07/2022: TSH 5.170 03/14/2022: ALT 24 11/10/2022: BUN 21; Creatinine, Ser 1.04; Hemoglobin 15.3; NT-Pro BNP 6,091; Platelets 97; Potassium 4.7; Sodium 133  Recent Lipid Panel    Component Value Date/Time   CHOL 110 11/05/2018 0256   TRIG 90 11/05/2018 0256   HDL 48 11/05/2018 0256   CHOLHDL 2.3 11/05/2018 0256   VLDL 18 11/05/2018 0256   LDLCALC 44 11/05/2018 0256     Risk Assessment/Calculations:    CHA2DS2-VASc Score = 7   This indicates a 11.2% annual risk of stroke. The patient's score is based upon: CHF History: 1 HTN History: 0 Diabetes History: 1  Stroke History: 2 Vascular Disease History: 1 Age Score: 2 Gender Score: 0               Physical Exam:    VS:  BP 120/72   Pulse 63   Ht 6\' 1"  (1.854 m)   Wt 182 lb 3.2 oz (82.6 kg)   SpO2 98%   BMI 24.04 kg/m     Wt Readings from Last 3 Encounters:  12/04/22 181 lb (82.1 kg)  12/01/22 182 lb 3.2 oz (82.6 kg)  11/10/22 202 lb (91.6 kg)     GEN:  Well nourished, well developed in no acute distress HEENT: Normal NECK: Prominent V waves; No carotid bruits LYMPHATICS: No lymphadenopathy CARDIAC: RRR, no murmurs, rubs, gallops RESPIRATORY:  Clear to auscultation without rales, wheezing or rhonchi  ABDOMEN: Soft, non-tender, non-distended MUSCULOSKELETAL:  No edema; No deformity  SKIN: Warm and dry NEUROLOGIC:  Alert and oriented x 3 PSYCHIATRIC:  Normal affect   Assessment & Plan Longstanding persistent atrial fibrillation (HCC) Continue apixaban for anticoagulation.  Continue digoxin and metoprolol succinate for rate control. Chronic systolic heart failure (HCC) LVEF remains severely diminished.  Patient with clinical improvement initially after CRT, but has declined again  and decompensated when he held his diuretic therapy.  He has been evaluated by Dr. Graciela Husbands in the pacemaker clinic and digoxin was added to try to improve the percentage of biventricular pacing.  He will continue on digoxin, Jardiance, metoprolol succinate and Demadex.  I am going to reduce his torsemide dose to 20 mg daily and his Klor-Con will also be reduced by 50% to 10 mill equivalents daily.  We discussed his prognosis today.  He requests consultation with palliative care and a referral will be made.  We talked about the most common ways that patients with heart failure diet and he understands this could be a sudden death event related to arrhythmia or could be slow progression of pump failure with increased fatigue, shortness of breath, and loss of appetite.  He expresses very good understanding of his condition. Coronary artery disease involving native coronary artery of native heart without angina pectoris No anginal symptoms.  On apixaban's and no antiplatelet therapy at this time. Mixed hyperlipidemia Treated with atorvastatin 80 mg daily.  LDL goal less than 70 mg/dL. Medication management As detailed above, will reduce torsemide and Klor-Con by 50%.            Medication Adjustments/Labs and Tests Ordered: Current medicines are reviewed at length with the patient today.  Concerns regarding medicines are outlined above.  Orders Placed This Encounter  Procedures   Digoxin level   Basic metabolic panel   Amb Referral to Palliative Care   Meds ordered this encounter  Medications   torsemide (DEMADEX) 20 MG tablet    Sig: Take 1 tablet (20 mg total) by mouth daily.    Dispense:  90 tablet    Refill:  3    Dose DECREASE   potassium chloride (KLOR-CON) 10 MEQ tablet    Sig: Take 1 tablet (10 mEq total) by mouth daily.    Dispense:  90 tablet    Refill:  3    Dose DECREASE    Patient Instructions  Medication Instructions:  DECREASE Torsemide to 20mg  daily DECREASE Potassium  Chloride to daily *If you need a refill on your cardiac medications before your next appointment, please call your pharmacy*  Lab Work: Digoxin, BMET next week If you have labs (blood work) drawn today  and your tests are completely normal, you will receive your results only by: MyChart Message (if you have MyChart) OR A paper copy in the mail If you have any lab test that is abnormal or we need to change your treatment, we will call you to review the results.  Testing/Procedures: Ambulatory referral to palliative care  Follow-Up: At [facility], you and your health needs are our priority.  As part of our continuing mission to provide you with exceptional heart care, we have created designated Provider Care Teams.  These Care Teams include your primary Cardiologist (physician) and Advanced Practice Providers (APPs -  Physician Assistants and Nurse Practitioners) who all work together to provide you with the care you need, when you need it.  Your next appointment:   2 month(s)  Provider:   Tereso Newcomer, PA       Signed, [name], MD  12/05/2022 9:55 AM    [facility]
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
## 2022-12-01 NOTE — Patient Instructions (Signed)
Medication Instructions:  DECREASE Torsemide to 20mg  daily DECREASE Potassium Chloride to daily *If you need a refill on your cardiac medications before your next appointment, please call your pharmacy*  Lab Work: Digoxin, BMET next week If you have labs (blood work) drawn today and your tests are completely normal, you will receive your results only by: MyChart Message (if you have MyChart) OR A paper copy in the mail If you have any lab test that is abnormal or we need to change your treatment, we will call you to review the results.  Testing/Procedures: Ambulatory referral to palliative care  Follow-Up: At [facility], you and your health needs are our priority.  As part of our continuing mission to provide you with exceptional heart care, we have created designated Provider Care Teams.  These Care Teams include your primary Cardiologist (physician) and Advanced Practice Providers (APPs -  Physician Assistants and Nurse Practitioners) who all work together to provide you with the care you need, when you need it.  Your next appointment:   2 month(s)  Provider:   Tereso Newcomer, PA

## 2022-12-04 ENCOUNTER — Telehealth: Payer: Self-pay

## 2022-12-04 ENCOUNTER — Ambulatory Visit (INDEPENDENT_AMBULATORY_CARE_PROVIDER_SITE_OTHER): Payer: Medicare Other | Admitting: Neurology

## 2022-12-04 ENCOUNTER — Encounter: Payer: Self-pay | Admitting: Neurology

## 2022-12-04 ENCOUNTER — Telehealth: Payer: Self-pay | Admitting: Cardiovascular Disease

## 2022-12-04 DIAGNOSIS — G43109 Migraine with aura, not intractable, without status migrainosus: Secondary | ICD-10-CM

## 2022-12-04 DIAGNOSIS — I4811 Longstanding persistent atrial fibrillation: Secondary | ICD-10-CM

## 2022-12-04 DIAGNOSIS — Z79899 Other long term (current) drug therapy: Secondary | ICD-10-CM

## 2022-12-04 DIAGNOSIS — I5022 Chronic systolic (congestive) heart failure: Secondary | ICD-10-CM

## 2022-12-04 MED ORDER — AIMOVIG 140 MG/ML ~~LOC~~ SOAJ: 140.0000 mg | SUBCUTANEOUS | 11 refills | Status: AC

## 2022-12-04 NOTE — Telephone Encounter (Signed)
Spoke with patient, getting repeat labs at [facility] tomorrow. Placed orders and included digoxin level per Excell Seltzer visit on 12/01/22. No questions at this time

## 2022-12-04 NOTE — Telephone Encounter (Signed)
-----   Message from Sheilah Pigeon sent at 12/04/2022  9:10 AM EST ----- Looks like he has seen a couple providers since these were done, had med changes, but I don't see any additional labs. He needs to have follow up BMET given changes to his diuretics made by Dr. Graciela Husbands as well as CBC please

## 2022-12-04 NOTE — Patient Instructions (Signed)
Aimovig 140mg  every 28 days

## 2022-12-04 NOTE — Telephone Encounter (Signed)
Patient's wife is calling stating Pallative care advised them they do not come out to [facility]. She is requesting a referral be submitted to [facility] instead and she be called back notifying once new referral has been placed. Please advise.

## 2022-12-05 DIAGNOSIS — Z8673 Personal history of transient ischemic attack (TIA), and cerebral infarction without residual deficits: Secondary | ICD-10-CM | POA: Diagnosis not present

## 2022-12-05 DIAGNOSIS — I480 Paroxysmal atrial fibrillation: Secondary | ICD-10-CM | POA: Diagnosis not present

## 2022-12-05 DIAGNOSIS — N189 Chronic kidney disease, unspecified: Secondary | ICD-10-CM | POA: Diagnosis not present

## 2022-12-05 DIAGNOSIS — Z950 Presence of cardiac pacemaker: Secondary | ICD-10-CM | POA: Diagnosis not present

## 2022-12-05 DIAGNOSIS — E785 Hyperlipidemia, unspecified: Secondary | ICD-10-CM | POA: Diagnosis not present

## 2022-12-05 DIAGNOSIS — I509 Heart failure, unspecified: Secondary | ICD-10-CM | POA: Diagnosis not present

## 2022-12-05 DIAGNOSIS — I4811 Longstanding persistent atrial fibrillation: Secondary | ICD-10-CM | POA: Diagnosis not present

## 2022-12-05 DIAGNOSIS — E782 Mixed hyperlipidemia: Secondary | ICD-10-CM | POA: Diagnosis not present

## 2022-12-05 DIAGNOSIS — E118 Type 2 diabetes mellitus with unspecified complications: Secondary | ICD-10-CM | POA: Diagnosis not present

## 2022-12-05 DIAGNOSIS — I519 Heart disease, unspecified: Secondary | ICD-10-CM | POA: Diagnosis not present

## 2022-12-05 DIAGNOSIS — I10 Essential (primary) hypertension: Secondary | ICD-10-CM | POA: Diagnosis not present

## 2022-12-05 DIAGNOSIS — Z79899 Other long term (current) drug therapy: Secondary | ICD-10-CM | POA: Diagnosis not present

## 2022-12-05 DIAGNOSIS — H3530 Unspecified macular degeneration: Secondary | ICD-10-CM | POA: Diagnosis not present

## 2022-12-05 DIAGNOSIS — Z7901 Long term (current) use of anticoagulants: Secondary | ICD-10-CM | POA: Diagnosis not present

## 2022-12-05 DIAGNOSIS — J449 Chronic obstructive pulmonary disease, unspecified: Secondary | ICD-10-CM | POA: Diagnosis not present

## 2022-12-05 DIAGNOSIS — I5022 Chronic systolic (congestive) heart failure: Secondary | ICD-10-CM | POA: Diagnosis not present

## 2022-12-05 DIAGNOSIS — I2510 Atherosclerotic heart disease of native coronary artery without angina pectoris: Secondary | ICD-10-CM | POA: Diagnosis not present

## 2022-12-05 DIAGNOSIS — I714 Abdominal aortic aneurysm, without rupture, unspecified: Secondary | ICD-10-CM | POA: Diagnosis not present

## 2022-12-05 LAB — BASIC METABOLIC PANEL
BUN/Creatinine Ratio: 22 (ref 10–24)
BUN: 25 mg/dL (ref 8–27)
CO2: 27 mmol/L (ref 20–29)
Calcium: 9.4 mg/dL (ref 8.6–10.2)
Chloride: 93 mmol/L — ABNORMAL LOW (ref 96–106)
Creatinine, Ser: 1.12 mg/dL (ref 0.76–1.27)
Glucose: 269 mg/dL — ABNORMAL HIGH (ref 70–99)
Potassium: 5.3 mmol/L — ABNORMAL HIGH (ref 3.5–5.2)
Sodium: 134 mmol/L (ref 134–144)
eGFR: 63 mL/min/{1.73_m2} (ref >59)

## 2022-12-05 LAB — DIGOXIN LEVEL: Digoxin, Serum: 0.8 ng/mL (ref 0.5–0.9)

## 2022-12-05 NOTE — Assessment & Plan Note (Signed)
Continue apixaban for anticoagulation.  Continue digoxin and metoprolol succinate for rate control.

## 2022-12-05 NOTE — Assessment & Plan Note (Signed)
No anginal symptoms.  On apixaban's and no antiplatelet therapy at this time.

## 2022-12-05 NOTE — Telephone Encounter (Signed)
Called [facility] per pt request and gave verbal order for palliative care referral.

## 2022-12-06 DIAGNOSIS — Z950 Presence of cardiac pacemaker: Secondary | ICD-10-CM | POA: Diagnosis not present

## 2022-12-06 DIAGNOSIS — H3530 Unspecified macular degeneration: Secondary | ICD-10-CM | POA: Diagnosis not present

## 2022-12-06 DIAGNOSIS — I509 Heart failure, unspecified: Secondary | ICD-10-CM | POA: Diagnosis not present

## 2022-12-06 DIAGNOSIS — E785 Hyperlipidemia, unspecified: Secondary | ICD-10-CM | POA: Diagnosis not present

## 2022-12-06 DIAGNOSIS — I519 Heart disease, unspecified: Secondary | ICD-10-CM | POA: Diagnosis not present

## 2022-12-06 DIAGNOSIS — J449 Chronic obstructive pulmonary disease, unspecified: Secondary | ICD-10-CM | POA: Diagnosis not present

## 2022-12-07 DIAGNOSIS — J449 Chronic obstructive pulmonary disease, unspecified: Secondary | ICD-10-CM | POA: Diagnosis not present

## 2022-12-07 DIAGNOSIS — I509 Heart failure, unspecified: Secondary | ICD-10-CM | POA: Diagnosis not present

## 2022-12-07 DIAGNOSIS — E785 Hyperlipidemia, unspecified: Secondary | ICD-10-CM | POA: Diagnosis not present

## 2022-12-07 DIAGNOSIS — Z950 Presence of cardiac pacemaker: Secondary | ICD-10-CM | POA: Diagnosis not present

## 2022-12-07 DIAGNOSIS — I519 Heart disease, unspecified: Secondary | ICD-10-CM | POA: Diagnosis not present

## 2022-12-07 DIAGNOSIS — H3530 Unspecified macular degeneration: Secondary | ICD-10-CM | POA: Diagnosis not present

## 2022-12-14 ENCOUNTER — Encounter: Payer: Self-pay | Admitting: Cardiovascular Disease

## 2022-12-14 DIAGNOSIS — E118 Type 2 diabetes mellitus with unspecified complications: Secondary | ICD-10-CM

## 2022-12-14 DIAGNOSIS — J449 Chronic obstructive pulmonary disease, unspecified: Secondary | ICD-10-CM | POA: Diagnosis not present

## 2022-12-14 DIAGNOSIS — I519 Heart disease, unspecified: Secondary | ICD-10-CM | POA: Diagnosis not present

## 2022-12-14 DIAGNOSIS — Z950 Presence of cardiac pacemaker: Secondary | ICD-10-CM | POA: Diagnosis not present

## 2022-12-14 DIAGNOSIS — I5022 Chronic systolic (congestive) heart failure: Secondary | ICD-10-CM

## 2022-12-14 DIAGNOSIS — H3530 Unspecified macular degeneration: Secondary | ICD-10-CM | POA: Diagnosis not present

## 2022-12-14 DIAGNOSIS — Z79899 Other long term (current) drug therapy: Secondary | ICD-10-CM

## 2022-12-14 DIAGNOSIS — E785 Hyperlipidemia, unspecified: Secondary | ICD-10-CM | POA: Diagnosis not present

## 2022-12-14 DIAGNOSIS — I509 Heart failure, unspecified: Secondary | ICD-10-CM | POA: Diagnosis not present

## 2022-12-18 ENCOUNTER — Telehealth: Payer: Self-pay | Admitting: Pharmacy Technician

## 2022-12-18 ENCOUNTER — Telehealth: Payer: Self-pay

## 2022-12-18 ENCOUNTER — Other Ambulatory Visit (HOSPITAL_COMMUNITY): Payer: Self-pay

## 2022-12-18 ENCOUNTER — Encounter: Payer: Self-pay | Admitting: Neurology

## 2022-12-18 NOTE — Telephone Encounter (Signed)
PA request has been  started . New Encounter created for follow up. For additional info see Pharmacy Prior Auth telephone encounter from 12/16/22.

## 2022-12-18 NOTE — Telephone Encounter (Signed)
Pharmacy Patient Advocate Encounter   Received notification from Pt Calls Messages that prior authorization for Aimovig is required/requested.   Insurance verification completed.   The patient is insured through [facility] .   Per test claim: PA required; PA started via CoverMyMeds. KEY [id number] . Waiting for clinical questions to populate.

## 2022-12-19 ENCOUNTER — Telehealth: Payer: Self-pay | Admitting: Physician Assistant

## 2022-12-19 ENCOUNTER — Encounter: Payer: Self-pay | Admitting: Cardiovascular Disease

## 2022-12-19 DIAGNOSIS — I4811 Longstanding persistent atrial fibrillation: Secondary | ICD-10-CM

## 2022-12-19 DIAGNOSIS — I5022 Chronic systolic (congestive) heart failure: Secondary | ICD-10-CM

## 2022-12-19 DIAGNOSIS — Z79899 Other long term (current) drug therapy: Secondary | ICD-10-CM

## 2022-12-19 NOTE — Telephone Encounter (Signed)
Spoke with patient to follow-up from lab results in October. Patient reports he is doing great.  Per Renee:  Sheilah Pigeon, PA-C 12/14/2022  8:46 AM EST Back to Top    Please f/u with him, thank you!  Renee Norberto Sorenson, PA-C 12/04/2022  9:10 AM EST     Looks like he has seen a couple providers since these were done, had med changes, but I don't see any additional labs. He needs to have follow up BMET given changes to his diuretics made by Dr. Graciela Husbands as well as CBC please    Patient had digoxin level and BMET drawn on 12/05/22, labs reviewed by Dr. Excell Seltzer and patient notified on 12/12/22.  Informed patient Luster Landsberg had planned to recheck BMET and CBC. It appears the labs were ordered but not released and they did not get drawn.  Patient states he currently receives palliative care and will be meeting with his nurse this week on Thursday 11/21. He will ask if the palliative care nurse can draw labs at his home.  Patient will call after visit with nurse to let us know if they are able to draw labs and if orders are needed.

## 2022-12-19 NOTE — Telephone Encounter (Signed)
-----   Message from Sheilah Pigeon sent at 12/14/2022  8:46 AM EST ----- Please f/u with him, thank you!

## 2022-12-20 NOTE — Telephone Encounter (Signed)
I think it's ok to wait for repeat labs until he sees [place] in January. thanks

## 2022-12-21 ENCOUNTER — Ambulatory Visit (INDEPENDENT_AMBULATORY_CARE_PROVIDER_SITE_OTHER): Payer: Medicare Other

## 2022-12-21 DIAGNOSIS — Z950 Presence of cardiac pacemaker: Secondary | ICD-10-CM | POA: Diagnosis not present

## 2022-12-21 DIAGNOSIS — E785 Hyperlipidemia, unspecified: Secondary | ICD-10-CM | POA: Diagnosis not present

## 2022-12-21 DIAGNOSIS — H3530 Unspecified macular degeneration: Secondary | ICD-10-CM | POA: Diagnosis not present

## 2022-12-21 DIAGNOSIS — J449 Chronic obstructive pulmonary disease, unspecified: Secondary | ICD-10-CM | POA: Diagnosis not present

## 2022-12-21 DIAGNOSIS — I255 Ischemic cardiomyopathy: Secondary | ICD-10-CM | POA: Diagnosis not present

## 2022-12-21 DIAGNOSIS — I509 Heart failure, unspecified: Secondary | ICD-10-CM | POA: Diagnosis not present

## 2022-12-21 DIAGNOSIS — I519 Heart disease, unspecified: Secondary | ICD-10-CM | POA: Diagnosis not present

## 2022-12-21 LAB — CUP PACEART REMOTE DEVICE CHECK
Battery Remaining Longevity: 99 mo
Battery Voltage: 3.01 V
Brady Statistic RA Percent Paced: 0 %
Brady Statistic RV Percent Paced: 90.87 %
Date Time Interrogation Session: 20241121023802
Implantable Lead Connection Status: 753985
Implantable Lead Connection Status: 753985
Implantable Lead Connection Status: 753985
Implantable Lead Implant Date: 20040309
Implantable Lead Implant Date: 20040309
Implantable Lead Implant Date: 20240221
Implantable Lead Location: 753858
Implantable Lead Location: 753859
Implantable Lead Location: 753860
Implantable Lead Model: 158
Implantable Lead Model: 158
Implantable Lead Model: 5076
Implantable Lead Serial Number: 115061
Implantable Pulse Generator Implant Date: 20240221
Lead Channel Impedance Value: 304 Ohm
Lead Channel Impedance Value: 380 Ohm
Lead Channel Impedance Value: 437 Ohm
Lead Channel Impedance Value: 456 Ohm
Lead Channel Impedance Value: 494 Ohm
Lead Channel Impedance Value: 494 Ohm
Lead Channel Impedance Value: 494 Ohm
Lead Channel Impedance Value: 570 Ohm
Lead Channel Impedance Value: 665 Ohm
Lead Channel Impedance Value: 779 Ohm
Lead Channel Impedance Value: 798 Ohm
Lead Channel Impedance Value: 836 Ohm
Lead Channel Impedance Value: 893 Ohm
Lead Channel Impedance Value: 969 Ohm
Lead Channel Pacing Threshold Amplitude: 0.75 V
Lead Channel Pacing Threshold Amplitude: 1.375 V
Lead Channel Pacing Threshold Pulse Width: 0.4 ms
Lead Channel Pacing Threshold Pulse Width: 1 ms
Lead Channel Sensing Intrinsic Amplitude: 1.375 mV
Lead Channel Sensing Intrinsic Amplitude: 1.375 mV
Lead Channel Sensing Intrinsic Amplitude: 5.5 mV
Lead Channel Sensing Intrinsic Amplitude: 5.5 mV
Lead Channel Setting Pacing Amplitude: 2.5 V
Lead Channel Setting Pacing Amplitude: 2.5 V
Lead Channel Setting Pacing Pulse Width: 0.4 ms
Lead Channel Setting Pacing Pulse Width: 1 ms
Lead Channel Setting Sensing Sensitivity: 0.9 mV
Zone Setting Status: 755011
Zone Setting Status: 755011

## 2022-12-25 ENCOUNTER — Encounter: Payer: Self-pay | Admitting: "Endocrinology

## 2022-12-25 ENCOUNTER — Other Ambulatory Visit: Payer: Self-pay

## 2022-12-25 ENCOUNTER — Ambulatory Visit (INDEPENDENT_AMBULATORY_CARE_PROVIDER_SITE_OTHER): Payer: Medicare Other | Admitting: "Endocrinology

## 2022-12-25 VITALS — BP 90/60 | HR 87 | Ht 73.0 in | Wt 182.6 lb

## 2022-12-25 DIAGNOSIS — E7800 Pure hypercholesterolemia, unspecified: Secondary | ICD-10-CM

## 2022-12-25 DIAGNOSIS — Z794 Long term (current) use of insulin: Secondary | ICD-10-CM

## 2022-12-25 DIAGNOSIS — E1165 Type 2 diabetes mellitus with hyperglycemia: Secondary | ICD-10-CM

## 2022-12-25 DIAGNOSIS — Z7984 Long term (current) use of oral hypoglycemic drugs: Secondary | ICD-10-CM

## 2022-12-25 LAB — POCT GLYCOSYLATED HEMOGLOBIN (HGB A1C): Hemoglobin A1C: 9.1 % — AB (ref 4.0–5.6)

## 2022-12-25 MED ORDER — DEXCOM G7 SENSOR MISC: 1.0000 | 1 refills | Status: DC

## 2022-12-25 MED ORDER — DEXCOM G7 SENSOR MISC: 1.0000 | 0 refills | Status: DC

## 2022-12-25 MED ORDER — LANTUS SOLOSTAR 100 UNIT/ML ~~LOC~~ SOPN: PEN_INJECTOR | SUBCUTANEOUS | 3 refills | Status: DC

## 2022-12-25 MED ORDER — INSULIN PEN NEEDLE 32G X 4 MM MISC: 1.0000 | Freq: Every day | 2 refills | Status: DC

## 2022-12-25 NOTE — Patient Instructions (Signed)
Start Lantus 10 units once every morning. Increase by 1 unit every day until fasting blood sugar is less than 120. Stay on that dose.    ______________    Goals of DM therapy:  Morning Fasting blood sugar: 80-140  Blood sugar before meals: 80-140 Bed time blood sugar: 100-150  A1C <7%, limited only by hypoglycemia  1.Diabetes medications and their side effects discussed, including hypoglycemia    2. Check blood glucose:  a) Always check blood sugars before driving. Please see below (under hypoglycemia) on how to manage b) Check a minimum of 3 times/day or more as needed when having symptoms of hypoglycemia.   c) Try to check blood glucose before sleeping/in the middle of the night to ensure that it is remaining stable and not dropping less than 100 d) Check blood glucose more often if sick  3. Diet: a) 3 meals per day schedule b: Restrict carbs to 60-70 grams (4 servings) per meal c) Colorful vegetables - 3 servings a day, and low sugar fruit 2 servings/day Plate control method: 1/4 plate protein, 1/4 starch, 1/2 green, yellow, or red vegetables d) Avoid carbohydrate snacks unless hypoglycemic episode, or increased physical activity  4. Regular exercise as tolerated, preferably 3 or more hours a week  5. Hypoglycemia: a)  Do not drive or operate machinery without first testing blood glucose to assure it is over 90 mg%, or if dizzy, lightheaded, not feeling normal, etc, or  if foot or leg is numb or weak. b)  If blood glucose less than 70, take four 5gm Glucose tabs or 15-30 gm Glucose gel.  Repeat every 15 min as needed until blood sugar is >100 mg/dl. If hypoglycemia persists then call 911.   6. Sick day management: a) Check blood glucose more often b) Continue usual therapy if blood sugars are elevated.   7. Contact the doctor immediately if blood glucose is frequently <60 mg/dl, or an episode of severe hypoglycemia occurs (where someone had to give you glucose/  glucagon  or if you passed out from a low blood glucose), or if blood glucose is persistently >350 mg/dl, for further management  8. A change in level of physical activity or exercise and a change in diet may also affect your blood sugar. Check blood sugars more often and call if needed.  Instructions: 1. Bring glucose meter, blood glucose records on every visit for review 2. Continue to follow up with primary care physician and other providers for medical care 3. Yearly eye  and foot exam 4. Please get blood work done prior to the next appointment

## 2022-12-25 NOTE — Progress Notes (Signed)
Outpatient Endocrinology Note Tony Holbrook, MD  12/25/22   [name] 1931-05-26 [id number]  Referring Provider: [name], MD Primary Care Provider: Charlane Ferretti, DO Reason for consultation: Subjective   Assessment & Plan  Diagnoses and all orders for this visit:  Type 2 diabetes mellitus with hyperglycemia, with long-term current use of insulin (HCC) -     POCT glycosylated hemoglobin (Hb A1C) -     Ambulatory referral to diabetic education -     C-peptide -     Comprehensive metabolic panel -     Microalbumin / creatinine urine ratio  Long term (current) use of oral hypoglycemic drugs  Pure hypercholesterolemia  Other orders -     Discontinue: Continuous Glucose Sensor (DEXCOM G7 SENSOR) MISC; 1 Device by Does not apply route continuous. -     insulin glargine (LANTUS SOLOSTAR) 100 UNIT/ML Solostar Pen; Start Lantus 10 units once every morning. Increase by 1 units a every day until fasting blood sugar is less than 150. Stay on that dose. -     Insulin Pen Needle 32G X 4 MM MISC; 1 Device by Does not apply route daily. -     Continuous Glucose Sensor (DEXCOM G7 SENSOR) MISC; 1 Device by Does not apply route continuous.    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 HF,  Lab Results  Component Value Date   GFR 77.10 10/30/2014   Hba1c goal less than 7, current Hba1c is  Lab Results  Component Value Date   HGBA1C 9.1 (A) 12/25/2022   Will recommend the following: Start Lantus 10 units once every morning. Increase by 1 unit every day until fasting blood sugar is less than 120. Stay on that dose.   Continue Jardiance 10 mg every day  Januvia 100 mg every day Nateglinide 60 mg 2 pills before break fast and 60 mg before dinner  Ordered lab and DexCom Checks BG at least qam before lantus   No known contraindications/side effects to any of above medications  -Last LD and Tg are as follows: Lab Results  Component Value Date   LDLCALC 44  11/05/2018    Lab Results  Component Value Date   TRIG 90 11/05/2018   -On atorvastatin 80 mg QD -Follow low fat diet and exercise   -Blood pressure goal <140/90 - Microalbumin/creatinine goal is < 30 -Last MA/Cr is as follows: No results found for: "MICROALBUR", "MALB24HUR" -not on ACE/ARB  -diet changes including salt restriction -limit eating outside -counseled BP targets per standards of diabetes care -uncontrolled blood pressure can lead to retinopathy, nephropathy and cardiovascular and atherosclerotic heart disease  Reviewed and counseled on: -A1C target -Blood sugar targets -Complications of uncontrolled diabetes  -Checking blood sugar before meals and bedtime and bring log next visit -All medications with mechanism of action and side effects -Hypoglycemia management: rule of 15's, Glucagon Emergency Kit and medical alert ID -low-carb low-fat plate-method diet -At least 20 minutes of physical activity per day -Annual dilated retinal eye exam and foot exam -compliance and follow up needs -follow up as scheduled or earlier if problem gets worse  Call if blood sugar is less than 70 or consistently above 250    Take a 15 gm snack of carbohydrate at bedtime before you go to sleep if your blood sugar is less than 100.    If you are going to fast after midnight for a test or procedure, ask your physician for instructions on how to reduce/decrease your  insulin dose.    Call if blood sugar is less than 70 or consistently above 250  -Treating a low sugar by rule of 15  (15 gms of sugar every 15 min until sugar is more than 70) If you feel your sugar is low, test your sugar to be sure If your sugar is low (less than 70), then take 15 grams of a fast acting Carbohydrate (3-4 glucose tablets or glucose gel or 4 ounces of juice or regular soda) Recheck your sugar 15 min after treating low to make sure it is more than 70 If sugar is still less than 70, treat again with 15 grams  of carbohydrate          Don't drive the hour of hypoglycemia  If unconscious/unable to eat or drink by mouth, use glucagon injection or nasal spray baqsimi and call 911. Can repeat again in 15 min if still unconscious.  Return in about 4 weeks (around 01/22/2023) for visit, labs today.   I have reviewed current medications, nurse's notes, allergies, vital signs, past medical and surgical history, family medical history, and social history for this encounter. Counseled patient on symptoms, examination findings, lab findings, imaging results, treatment decisions and monitoring and prognosis. The patient understood the recommendations and agrees with the treatment plan. All questions regarding treatment plan were fully answered.  Tony [place], MD  12/25/22    History of Present Illness [name] is a 89 y.o. year old male who presents for evaluation of Type II diabetes mellitus.  [name] was first diagnosed >20 years ago. Diabetes education +  Home diabetes regimen: Jardiance 10 mg every day  Januvia 100 mg every day Nateglinide 60 mg 2 pills before break fast and 60 mg before dinner   COMPLICATIONS +  MI/Stroke -  retinopathy -  neuropathy -  nephropathy  BLOOD SUGAR DATA 164-197 fasting, checks once a week  Physical Exam  BP 90/60   Pulse 87   Ht 6\' 1"  (1.854 m)   Wt 182 lb 9.6 oz (82.8 kg)   SpO2 95%   BMI 24.09 kg/m    Constitutional: well developed, well nourished Head: normocephalic, atraumatic Eyes: sclera anicteric, no redness Neck: supple Lungs: normal respiratory effort Neurology: alert and oriented Skin: dry, no appreciable rashes Musculoskeletal: no appreciable defects Psychiatric: normal mood and affect Diabetic Foot Exam - Simple   No data filed      Current Medications Patient's Medications  New Prescriptions   CONTINUOUS GLUCOSE SENSOR (DEXCOM G7 SENSOR) MISC    1 Device by Does not apply route continuous.   CONTINUOUS  GLUCOSE SENSOR (DEXCOM G7 SENSOR) MISC    1 Device by Does not apply route continuous.   INSULIN GLARGINE (LANTUS SOLOSTAR) 100 UNIT/ML SOLOSTAR PEN    Start Lantus 10 units once every morning. Increase by 1 units a every day until fasting blood sugar is less than 150. Stay on that dose.   INSULIN PEN NEEDLE 32G X 4 MM MISC    1 Device by Does not apply route daily.  Previous Medications   ATORVASTATIN (LIPITOR) 80 MG TABLET    TAKE ONE TABLET BY MOUTH EVERY DAY FOR CHOLESTEROL   CETIRIZINE (ZYRTEC) 10 MG TABLET    Take 10 mg by mouth daily.   DIGOXIN (LANOXIN) 0.125 MG TABLET    Take 1 tablet (0.125 mg total) by mouth daily.   ELIQUIS 5 MG TABS TABLET    TAKE ONE TABLET BY MOUTH TWICE  DAILY   EMPAGLIFLOZIN (JARDIANCE) 10 MG TABS TABLET    Take 10 mg by mouth in the morning.   EPIPEN 2-PAK 0.3 MG/0.3ML SOAJ INJECTION    Inject 0.3 mg into the muscle daily as needed for anaphylaxis.    ERENUMAB-AOOE (AIMOVIG) 140 MG/ML SOAJ    Inject 140 mg into the skin every 28 (twenty-eight) days.   FLUTICASONE (FLONASE) 50 MCG/ACT NASAL SPRAY    Place 1 spray into the nose daily as needed for rhinitis or allergies.   IBUPROFEN (ADVIL) 200 MG TABLET    Take 400 mg by mouth every 8 (eight) hours as needed (headaches/migraine).   MAGNESIUM 250 MG TABS    Take 250 mg by mouth in the morning.   METOPROLOL SUCCINATE (TOPROL-XL) 25 MG 24 HR TABLET    TAKE ONE TABLET BY MOUTH TWICE DAILY WITH OR immediately following A MEAL   NATEGLINIDE (STARLIX) 120 MG TABLET    Take 120 mg by mouth daily with breakfast. Per patient taking 2 tablets at breakfast   NATEGLINIDE (STARLIX) 60 MG TABLET    Take 60 mg by mouth at bedtime.   ONETOUCH VERIO TEST STRIP    SMARTSIG:Via Meter 1-2 Times Daily   POTASSIUM CHLORIDE (KLOR-CON) 10 MEQ TABLET    Take 1 tablet (10 mEq total) by mouth daily.   SITAGLIPTIN (JANUVIA) 100 MG TABLET    Take 100 mg by mouth at bedtime.   TORSEMIDE (DEMADEX) 20 MG TABLET    Take 1 tablet (20 mg total) by  mouth daily.  Modified Medications   No medications on file  Discontinued Medications   No medications on file    Allergies Allergies  Allergen Reactions   Bee Venom Anaphylaxis   Latex Other (See Comments) and Rash    REDNESS AT REACTION SITE.   Metformin Diarrhea   Rivaroxaban Other (See Comments)    Headaches, blurred vision  Other Reaction(s): Dizziness   Sotalol Other (See Comments)    "BLUE TOES"- cut his circulation off   Levofloxacin     long QT syndrome, so avoid   Warfarin Sodium Other (See Comments)    migraine headaches/vision impariment    Past Medical History Past Medical History:  Diagnosis Date   Acute on chronic combined systolic (congestive) and diastolic (congestive) heart failure (HCC)    AICD (automatic cardioverter/defibrillator) present    [employer]n PPM/ICD   Carotid stenosis, right    s/p endarterectomy 11/07/18   Chronic combined systolic (congestive) and diastolic (congestive) heart failure (HCC)    Coronary artery disease    [facility]   Diabetes mellitus    Dysrhythmia    Endocarditis, valve unspecified    Mitral   Headache    hx migraines 6-7 x mo   History of migraine headaches    History of seasonal allergies    ICD (implantable cardiac defibrillator) in place    Optic nerve hemorrhage, 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  We will monitor this closely and look for changes over the ensuing 6 months.  Repeat visit here in 6 months   PAF (paroxysmal atrial fibrillation) (HCC)    NO LAA occlusion at the time of surgery  [name] 2018   Pleural effusion    PVC's (premature ventricular contractions)    Ventricular tachycardia, polymorphic (HCC)    status post ICD implantation    Past Surgical History  Past Surgical History:  Procedure Laterality Date   BIV UPGRADE N/A 03/22/2022   Procedure: BIV UPGRADE;  Surgeon: Duke Salvia, MD;  Location[facility] INVASIVE CV LAB;  Service: Cardiovascular;  Laterality: N/A;   BRONCHIAL BIOPSY  07/22/2020   Procedure: BRONCHIAL BIOPSIES;  Surgeon: Leslye Peer, MD;  Location: [facility] ENDOSCOPY;  Service: Cardiopulmonary;;   BRONCHIAL BRUSHINGS  07/22/2020   Procedure: BRONCHIAL BRUSHINGS;  Surgeon: Leslye Peer, MD;  Location: [facility] ENDOSCOPY;  Service: Cardiopulmonary;;   BRONCHIAL WASHINGS  07/22/2020   Procedure: BRONCHIAL WASHINGS;  Surgeon: Leslye Peer, MD;  Location: [facility] ENDOSCOPY;  Service: Cardiopulmonary;;   CARDIAC CATHETERIZATION  04/03/2007   EF 40%   CARDIAC CATHETERIZATION  02/06/2006   EF 45%. ANTERIOR HYPOKINESIS   CARDIAC CATHETERIZATION  05/29/2000   EF 50%. MILD ANTERIOR HYPOKINESIS   CARDIAC CATHETERIZATION  10/25/1999   EF 50%. SEVERE MITRAL REGURGITATION   CARDIAC CATHETERIZATION  01/06/2003   EF 50%   CARDIAC DEFIBRILLATOR PLACEMENT     CARDIOVERSION N/A 02/07/2019   Procedure: CARDIOVERSION;  Surgeon: Pricilla Riffle, MD;  Location: [facility] ENDOSCOPY;  Service: Cardiovascular;  Laterality: N/A;   CARDIOVERSION N/A 11/24/2019   Procedure: CARDIOVERSION;  Surgeon: Wendall Stade, MD;  Location: [facility] ENDOSCOPY;  Service: Cardiovascular;  Laterality: N/A;   CARDIOVERSION N/A 02/15/2022   Procedure: CARDIOVERSION;  Surgeon: Jake Bathe, MD;  Location: [facility] ENDOSCOPY;  Service: Cardiovascular;  Laterality: N/A;   CARDIOVERSION N/A 03/03/2022   Procedure: CARDIOVERSION;  Surgeon: Parke Poisson, MD;  Location: [facility] ENDOSCOPY;  Service: Cardiovascular;  Laterality: N/A;   CATARACT EXTRACTION W/ INTRAOCULAR LENS  IMPLANT, BILATERAL     CHOLECYSTECTOMY N/A 01/08/2019   Procedure: LAPAROSCOPIC CHOLECYSTECTOMY WITH INTRAOPERATIVE CHOLANGIOGRAM;  Surgeon: Manus Rudd, MD;  Location: [facility] OR;  Service: General;  Laterality: N/A;   CORONARY ARTERY BYPASS GRAFT  2001   2 VESSEL CABG AND MITRAL VALVE REPAIR. HE HAD A LIMA GRAFT TO THE LAD AND RADIAL ARTERY  GRAFT TO THE OBTUSE  MARGINAL  OF LEFT CIRCUMFLEX   CORONARY PRESSURE/FFR STUDY N/A 01/05/2017   Procedure: INTRAVASCULAR PRESSURE WIRE/FFR STUDY;  Surgeon: [name], MD;  Location: [facility] INVASIVE CV LAB;  Service: Cardiovascular;  Laterality: N/A;   ENDARTERECTOMY Right 11/07/2018   Procedure: ENDARTERECTOMY CAROTID RIGHT;  Surgeon: Maeola Harman, MD;  Location: [facility] OR;  Service: Vascular;  Laterality: Right;   EP IMPLANTABLE DEVICE N/A 11/06/2014   Procedure: ICD Generator Changeout;  Surgeon: Duke Salvia, MD;  Location: [facility] INVASIVE CV LAB;  Service: Cardiovascular;  Laterality: N/A;   ERCP N/A 01/10/2019   Procedure: ENDOSCOPIC RETROGRADE CHOLANGIOPANCREATOGRAPHY (ERCP);  Surgeon: Vida Rigger, MD;  Location: [facility] ENDOSCOPY;  Service: Endoscopy;  Laterality: N/A;   FOREIGN BODY REMOVAL Right 06/21/2017   Procedure: FOREIGN BODY REMOVAL ADULT RIGHT RING FINGER;  Surgeon: Betha Loa, MD;  Location: [facility] OR;  Service: Orthopedics;  Laterality: Right;   HEMORRHOID SURGERY     HEMORROIDECTOMY     KNEE ARTHROSCOPY     RIGHT KNEE   LAPAROSCOPIC APPENDECTOMY N/A 01/08/2019   Procedure: APPENDECTOMY LAPAROSCOPIC;  Surgeon: Manus Rudd, MD;  Location: [facility] OR;  Service: General;  Laterality: N/A;   LEFT HEART CATHETERIZATION WITH CORONARY ANGIOGRAM N/A 07/04/2012   Procedure: LEFT HEART CATHETERIZATION WITH CORONARY ANGIOGRAM;  Surgeon: [name], MD;  Location: [facility] CATH LAB;  Service: Cardiovascular;  Laterality: N/A;   MITRAL VALVE REPLACEMENT     No LAA occlusion at the time of surgery  Tamela Oddi report 2018   REMOVAL OF STONES  01/10/2019   Procedure: REMOVAL OF STONES;  Surgeon: Vida Rigger, MD;  Location: [facility] ENDOSCOPY;  Service: Endoscopy;;   RIGHT/LEFT HEART CATH AND CORONARY/GRAFT ANGIOGRAPHY N/A 01/05/2017   Procedure: RIGHT/LEFT HEART CATH AND CORONARY/GRAFT ANGIOGRAPHY;  Surgeon: [name], MD;  Location: [facility] INVASIVE CV LAB;  Service: Cardiovascular;  Laterality: N/A;   SPHINCTEROTOMY   01/10/2019   Procedure: SPHINCTEROTOMY;  Surgeon: Vida Rigger, MD;  Location: [facility] ENDOSCOPY;  Service: Endoscopy;;   TRANSTHORACIC ECHOCARDIOGRAM  04/02/2007   EF 35%   VIDEO BRONCHOSCOPY Right 07/22/2020   Procedure: VIDEO BRONCHOSCOPY WITH FLUORO, BAL AND BIOPSY;  Surgeon: Leslye Peer, MD;  Location: [facility] ENDOSCOPY;  Service: Cardiopulmonary;  Laterality: Right;  CONSCIOUS SEDATION OR MAC    Family History family history includes Cancer in his sister and another family member; Diabetes in an other family member; Emphysema in his father; Heart disease in his father, paternal grandfather, and paternal grandmother; Stroke in his mother.  Social History Social History   Socioeconomic History   Marital status: Married    Spouse name: Elease Hashimoto   Number of children: 2   Years of education: Not on file   Highest education level: Associate degree: academic program  Occupational History   Occupation: retired  Tobacco Use   Smoking status: Former    Current packs/day: 0.00    Average packs/day: 2.0 packs/day for 16.0 years (32.1 ttl pk-yrs)    Types: Cigarettes    Start date: 02/23/1956    Quit date: 01/31/1971    Years since quitting: 51.9   Smokeless tobacco: Never  Vaping Use   Vaping status: Never Used  Substance and Sexual Activity   Alcohol use: Yes    Alcohol/week: 0.0 standard drinks of alcohol    Comment: 1-2 a week   Drug use: No   Sexual activity: Yes  Other Topics Concern   Not on file  Social History Narrative   Patient is right-handed. He lives with his wife ina 2 story home. He drinks one cup of coffee and a diet coke a day.    Social Determinants of Health   Financial Resource Strain: Not on file  Food Insecurity: Not on file  Transportation Needs: Not on file  Physical Activity: Not on file  Stress: Not on file  Social Connections: Not on file  Intimate Partner Violence: Not on file    Lab Results  Component Value Date   HGBA1C 9.1 (A) 12/25/2022   HGBA1C  6.8 (H) 11/04/2018   HGBA1C 6.1 (H) 06/20/2017   Lab Results  Component Value Date   CHOL 110 11/05/2018   Lab Results  Component Value Date   HDL 48 11/05/2018   Lab Results  Component Value Date   LDLCALC 44 11/05/2018   Lab Results  Component Value Date   TRIG 90 11/05/2018   Lab Results  Component Value Date   CHOLHDL 2.3 11/05/2018   Lab Results  Component Value Date   CREATININE 1.12 12/05/2022   Lab Results  Component Value Date   GFR 77.10 10/30/2014   No results found for: "MICROALBUR", "MALB24HUR"    Component Value Date/Time   NA 134 12/05/2022 0852   K 5.3 (H) 12/05/2022 0852   CL 93 (L) 12/05/2022 0852   CO2 27 12/05/2022 0852   GLUCOSE 269 (H) 12/05/2022 0852   GLUCOSE 170 (H) 02/15/2022 0816   BUN 25 12/05/2022 0852   CREATININE 1.12 12/05/2022 6962  CREATININE 1.08 01/04/2016 1630   CALCIUM 9.4 12/05/2022 0852   PROT 7.4 03/14/2022 1627   ALBUMIN 4.5 03/14/2022 1627   AST 34 03/14/2022 1627   ALT 24 03/14/2022 1627   ALKPHOS 183 (H) 03/14/2022 1627   BILITOT 3.0 (H) 03/14/2022 1627   GFRNONAA >60 07/14/2020 1124   GFRAA 74 09/24/2019 1139      Latest Ref Rng & Units 12/05/2022    8:52 AM 11/10/2022   11:30 AM 03/14/2022    4:27 PM  BMP  Glucose 70 - 99 mg/dL 413  244  010   BUN 8 - 27 mg/dL 25  21  27    Creatinine 0.76 - 1.27 mg/dL 2.72  5.36  6.44   BUN/Creat Ratio 10 - 24 22  20  20    Sodium 134 - 144 mmol/L 134  133  138   Potassium 3.5 - 5.2 mmol/L 5.3  4.7  4.3   Chloride 96 - 106 mmol/L 93  98  96   CO2 20 - 29 mmol/L 27  24  24    Calcium 8.6 - 10.2 mg/dL 9.4  9.0  9.2        Component Value Date/Time   WBC 6.8 11/10/2022 1130   WBC 6.6 07/14/2020 1124   RBC 4.43 11/10/2022 1130   RBC 4.30 07/14/2020 1124   HGB 15.3 11/10/2022 1130   HCT 47.5 11/10/2022 1130   PLT 97 (LL) 11/10/2022 1130   MCV 107 (H) 11/10/2022 1130   MCH 34.5 (H) 11/10/2022 1130   MCH 32.8 07/14/2020 1124   MCHC 32.2 11/10/2022 1130   MCHC 32.4  07/14/2020 1124   RDW 12.9 11/10/2022 1130   LYMPHSABS 0.6 (L) 07/14/2020 1124   LYMPHSABS 0.9 12/27/2016 1523   MONOABS 0.6 07/14/2020 1124   EOSABS 0.3 07/14/2020 1124   EOSABS 0.2 12/27/2016 1523   BASOSABS 0.0 07/14/2020 1124   BASOSABS 0.0 12/27/2016 1523     Parts of this note may have been dictated using voice recognition software. There may be variances in spelling and vocabulary which are unintentional. Not all errors are proofread. Please notify the Thereasa Parkin if any discrepancies are noted or if the meaning of any statement is not clear.

## 2022-12-25 NOTE — Telephone Encounter (Signed)
Clinical questions have been answered and PA submitted. PA currently Pending.  [id number]

## 2022-12-26 ENCOUNTER — Other Ambulatory Visit: Payer: Self-pay

## 2022-12-26 ENCOUNTER — Encounter: Payer: Medicare Other | Admitting: Internal Medicine

## 2022-12-26 DIAGNOSIS — E785 Hyperlipidemia, unspecified: Secondary | ICD-10-CM | POA: Diagnosis not present

## 2022-12-26 DIAGNOSIS — J449 Chronic obstructive pulmonary disease, unspecified: Secondary | ICD-10-CM | POA: Diagnosis not present

## 2022-12-26 DIAGNOSIS — Z950 Presence of cardiac pacemaker: Secondary | ICD-10-CM | POA: Diagnosis not present

## 2022-12-26 DIAGNOSIS — I509 Heart failure, unspecified: Secondary | ICD-10-CM | POA: Diagnosis not present

## 2022-12-26 DIAGNOSIS — E1165 Type 2 diabetes mellitus with hyperglycemia: Secondary | ICD-10-CM

## 2022-12-26 DIAGNOSIS — H3530 Unspecified macular degeneration: Secondary | ICD-10-CM | POA: Diagnosis not present

## 2022-12-26 DIAGNOSIS — I519 Heart disease, unspecified: Secondary | ICD-10-CM | POA: Diagnosis not present

## 2022-12-26 LAB — COMPREHENSIVE METABOLIC PANEL
AG Ratio: 1.1 (calc) (ref 1.0–2.5)
ALT: 28 U/L (ref 9–46)
AST: 29 U/L (ref 10–35)
Albumin: 4.3 g/dL (ref 3.6–5.1)
Alkaline phosphatase (APISO): 226 U/L — ABNORMAL HIGH (ref 35–144)
BUN/Creatinine Ratio: 23 (calc) — ABNORMAL HIGH (ref 6–22)
BUN: 27 mg/dL — ABNORMAL HIGH (ref 7–25)
CO2: 29 mmol/L (ref 20–32)
Calcium: 9.3 mg/dL (ref 8.6–10.3)
Chloride: 94 mmol/L — ABNORMAL LOW (ref 98–110)
Creat: 1.17 mg/dL (ref 0.70–1.22)
Globulin: 3.9 g/dL — ABNORMAL HIGH (ref 1.9–3.7)
Glucose, Bld: 380 mg/dL — ABNORMAL HIGH (ref 65–99)
Potassium: 4.4 mmol/L (ref 3.5–5.3)
Sodium: 133 mmol/L — ABNORMAL LOW (ref 135–146)
Total Bilirubin: 1.7 mg/dL — ABNORMAL HIGH (ref 0.2–1.2)
Total Protein: 8.2 g/dL — ABNORMAL HIGH (ref 6.1–8.1)

## 2022-12-26 LAB — MICROALBUMIN / CREATININE URINE RATIO
Creatinine, Urine: 26 mg/dL (ref 20–320)
Microalb Creat Ratio: 23 mg/g{creat} (ref <30)
Microalb, Ur: 0.6 mg/dL

## 2022-12-26 LAB — C-PEPTIDE: C-Peptide: 8.66 ng/mL — ABNORMAL HIGH (ref 0.80–3.85)

## 2022-12-26 MED ORDER — DEXCOM G7 SENSOR MISC: 1.0000 | 0 refills | Status: DC

## 2022-12-27 ENCOUNTER — Other Ambulatory Visit: Payer: Self-pay

## 2022-12-27 DIAGNOSIS — E1165 Type 2 diabetes mellitus with hyperglycemia: Secondary | ICD-10-CM

## 2022-12-27 MED ORDER — DEXCOM G7 SENSOR MISC: 1.0000 | 3 refills | Status: AC

## 2022-12-29 ENCOUNTER — Encounter: Payer: Self-pay | Admitting: "Endocrinology

## 2022-12-31 DIAGNOSIS — I10 Essential (primary) hypertension: Secondary | ICD-10-CM | POA: Diagnosis not present

## 2022-12-31 DIAGNOSIS — H3530 Unspecified macular degeneration: Secondary | ICD-10-CM | POA: Diagnosis not present

## 2022-12-31 DIAGNOSIS — I519 Heart disease, unspecified: Secondary | ICD-10-CM | POA: Diagnosis not present

## 2022-12-31 DIAGNOSIS — Z8673 Personal history of transient ischemic attack (TIA), and cerebral infarction without residual deficits: Secondary | ICD-10-CM | POA: Diagnosis not present

## 2022-12-31 DIAGNOSIS — E118 Type 2 diabetes mellitus with unspecified complications: Secondary | ICD-10-CM | POA: Diagnosis not present

## 2022-12-31 DIAGNOSIS — I714 Abdominal aortic aneurysm, without rupture, unspecified: Secondary | ICD-10-CM | POA: Diagnosis not present

## 2022-12-31 DIAGNOSIS — I509 Heart failure, unspecified: Secondary | ICD-10-CM | POA: Diagnosis not present

## 2022-12-31 DIAGNOSIS — I480 Paroxysmal atrial fibrillation: Secondary | ICD-10-CM | POA: Diagnosis not present

## 2022-12-31 DIAGNOSIS — N189 Chronic kidney disease, unspecified: Secondary | ICD-10-CM | POA: Diagnosis not present

## 2022-12-31 DIAGNOSIS — E785 Hyperlipidemia, unspecified: Secondary | ICD-10-CM | POA: Diagnosis not present

## 2022-12-31 DIAGNOSIS — Z7901 Long term (current) use of anticoagulants: Secondary | ICD-10-CM | POA: Diagnosis not present

## 2022-12-31 DIAGNOSIS — Z950 Presence of cardiac pacemaker: Secondary | ICD-10-CM | POA: Diagnosis not present

## 2022-12-31 DIAGNOSIS — J449 Chronic obstructive pulmonary disease, unspecified: Secondary | ICD-10-CM | POA: Diagnosis not present

## 2023-01-01 ENCOUNTER — Other Ambulatory Visit (HOSPITAL_COMMUNITY): Payer: Self-pay

## 2023-01-01 NOTE — Telephone Encounter (Signed)
Pharmacy Patient Advocate Encounter  Received notification from [facility] that Prior Authorization for Aimovig 140MG /ML auto-injectors has been APPROVED from NOW to 12/25/23. Ran test claim, Copay is $178.45. It also says COVERAGE GAP COPAY/COINSURANCE APPLIES. This test claim was processed through [facility]- copay amounts may vary at other pharmacies due to pharmacy/plan contracts, or as the patient moves through the different stages of their insurance plan. I also see noted that the pt is now under [facility] compassionate care, so I did not call the pharmacy to fill the med.   PA #/Case ID/Reference #: PA Case ID #: [id number]

## 2023-01-03 DIAGNOSIS — H3530 Unspecified macular degeneration: Secondary | ICD-10-CM | POA: Diagnosis not present

## 2023-01-03 DIAGNOSIS — I519 Heart disease, unspecified: Secondary | ICD-10-CM | POA: Diagnosis not present

## 2023-01-03 DIAGNOSIS — I509 Heart failure, unspecified: Secondary | ICD-10-CM | POA: Diagnosis not present

## 2023-01-03 DIAGNOSIS — J449 Chronic obstructive pulmonary disease, unspecified: Secondary | ICD-10-CM | POA: Diagnosis not present

## 2023-01-03 DIAGNOSIS — Z950 Presence of cardiac pacemaker: Secondary | ICD-10-CM | POA: Diagnosis not present

## 2023-01-03 DIAGNOSIS — E785 Hyperlipidemia, unspecified: Secondary | ICD-10-CM | POA: Diagnosis not present

## 2023-01-04 NOTE — Telephone Encounter (Signed)
Supplemental insurance is not valid for pharmacy claims.

## 2023-01-10 DIAGNOSIS — I509 Heart failure, unspecified: Secondary | ICD-10-CM | POA: Diagnosis not present

## 2023-01-10 DIAGNOSIS — I519 Heart disease, unspecified: Secondary | ICD-10-CM | POA: Diagnosis not present

## 2023-01-10 DIAGNOSIS — E785 Hyperlipidemia, unspecified: Secondary | ICD-10-CM | POA: Diagnosis not present

## 2023-01-10 DIAGNOSIS — Z950 Presence of cardiac pacemaker: Secondary | ICD-10-CM | POA: Diagnosis not present

## 2023-01-10 DIAGNOSIS — J449 Chronic obstructive pulmonary disease, unspecified: Secondary | ICD-10-CM | POA: Diagnosis not present

## 2023-01-10 DIAGNOSIS — H3530 Unspecified macular degeneration: Secondary | ICD-10-CM | POA: Diagnosis not present

## 2023-01-11 ENCOUNTER — Encounter: Payer: Self-pay | Admitting: "Endocrinology

## 2023-01-12 NOTE — Progress Notes (Signed)
Remote pacemaker transmission.   

## 2023-01-15 ENCOUNTER — Encounter: Payer: Medicare Other | Admitting: Physician Assistant

## 2023-01-17 DIAGNOSIS — E785 Hyperlipidemia, unspecified: Secondary | ICD-10-CM | POA: Diagnosis not present

## 2023-01-17 DIAGNOSIS — J449 Chronic obstructive pulmonary disease, unspecified: Secondary | ICD-10-CM | POA: Diagnosis not present

## 2023-01-17 DIAGNOSIS — Z950 Presence of cardiac pacemaker: Secondary | ICD-10-CM | POA: Diagnosis not present

## 2023-01-17 DIAGNOSIS — I509 Heart failure, unspecified: Secondary | ICD-10-CM | POA: Diagnosis not present

## 2023-01-17 DIAGNOSIS — I519 Heart disease, unspecified: Secondary | ICD-10-CM | POA: Diagnosis not present

## 2023-01-17 DIAGNOSIS — H3530 Unspecified macular degeneration: Secondary | ICD-10-CM | POA: Diagnosis not present

## 2023-01-22 NOTE — Telephone Encounter (Signed)
No future action needed.

## 2023-01-25 DIAGNOSIS — I519 Heart disease, unspecified: Secondary | ICD-10-CM | POA: Diagnosis not present

## 2023-01-25 DIAGNOSIS — Z950 Presence of cardiac pacemaker: Secondary | ICD-10-CM | POA: Diagnosis not present

## 2023-01-25 DIAGNOSIS — I509 Heart failure, unspecified: Secondary | ICD-10-CM | POA: Diagnosis not present

## 2023-01-25 DIAGNOSIS — H3530 Unspecified macular degeneration: Secondary | ICD-10-CM | POA: Diagnosis not present

## 2023-01-25 DIAGNOSIS — E785 Hyperlipidemia, unspecified: Secondary | ICD-10-CM | POA: Diagnosis not present

## 2023-01-25 DIAGNOSIS — J449 Chronic obstructive pulmonary disease, unspecified: Secondary | ICD-10-CM | POA: Diagnosis not present

## 2023-01-26 ENCOUNTER — Encounter: Payer: Self-pay | Admitting: "Endocrinology

## 2023-01-26 ENCOUNTER — Ambulatory Visit (INDEPENDENT_AMBULATORY_CARE_PROVIDER_SITE_OTHER): Payer: Medicare Other | Admitting: "Endocrinology

## 2023-01-26 VITALS — BP 92/64 | HR 61 | Resp 14 | Ht 72.0 in | Wt 184.8 lb

## 2023-01-26 DIAGNOSIS — E1165 Type 2 diabetes mellitus with hyperglycemia: Secondary | ICD-10-CM

## 2023-01-26 DIAGNOSIS — Z794 Long term (current) use of insulin: Secondary | ICD-10-CM

## 2023-01-26 DIAGNOSIS — E7800 Pure hypercholesterolemia, unspecified: Secondary | ICD-10-CM | POA: Diagnosis not present

## 2023-01-26 DIAGNOSIS — Z7984 Long term (current) use of oral hypoglycemic drugs: Secondary | ICD-10-CM

## 2023-01-26 LAB — POCT GLYCOSYLATED HEMOGLOBIN (HGB A1C): Hemoglobin A1C: 9.4 % — AB (ref 4.0–5.6)

## 2023-01-26 MED ORDER — BLOOD GLUCOSE TEST VI STRP
1.0000 | ORAL_STRIP | Freq: Three times a day (TID) | 3 refills | Status: AC
Start: 2023-01-26 — End: 2023-06-08

## 2023-01-26 MED ORDER — BLOOD GLUCOSE MONITORING SUPPL DEVI: 0 refills | Status: DC

## 2023-01-26 MED ORDER — BLOOD GLUCOSE TEST VI STRP: 1.0000 | ORAL_STRIP | Freq: Three times a day (TID) | 3 refills | Status: DC

## 2023-01-26 MED ORDER — BLOOD GLUCOSE MONITORING SUPPL DEVI: 1.0000 | Freq: Three times a day (TID) | 0 refills | Status: DC

## 2023-01-26 MED ORDER — NATEGLINIDE 120 MG PO TABS: 120.0000 mg | ORAL_TABLET | Freq: Three times a day (TID) | ORAL | 5 refills | Status: DC

## 2023-01-26 NOTE — Patient Instructions (Signed)
Will recommend the following: Lantus 18 unit qam  Jardiance 10 mg every day  Januvia 100 mg every day Nateglinide 120 mg  tidac before each meal

## 2023-01-26 NOTE — Progress Notes (Addendum)
Outpatient Endocrinology Note Tony Venango, MD  01/26/23   [name] 04-27-1931 [id number]  Referring Provider: Charlane Ferretti, DO Primary Care Provider: Charlane Ferretti, DO Reason for consultation: Subjective   Assessment & Plan  Diagnoses and all orders for this visit:  Type 2 diabetes mellitus with hyperglycemia, with long-term current use of insulin (HCC) -     POCT glycosylated hemoglobin (Hb A1C)  Long term (current) use of oral hypoglycemic drugs  Long-term insulin use (HCC)  Pure hypercholesterolemia  Other orders -     nateglinide (STARLIX) 120 MG tablet; Take 1 tablet (120 mg total) by mouth 3 (three) times daily with meals. -     Blood Glucose Monitoring Suppl DEVI; 1 each by Does not apply route in the morning, at noon, and at bedtime. May substitute to any manufacturer covered by patient's insurance. -     Glucose Blood (BLOOD GLUCOSE TEST STRIPS) STRP; 1 each by In Vitro route in the morning, at noon, and at bedtime. May substitute to any manufacturer covered by patient's insurance.    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 HF On palliative care, goal A1C 8% C-peptide +++ Lab Results  Component Value Date   GFR 77.10 10/30/2014   Hba1c goal less than 7, current Hba1c is  Lab Results  Component Value Date   HGBA1C 9.4 (A) 01/26/2023   Will recommend the following: Lantus 18 unit qam  Jardiance 10 mg every day - stop if rash continues, currently resolving  Januvia 100 mg every day Nateglinide 120 mg  tidac before each meal  On DexCom  No known contraindications/side effects to any of above medications Not interested in short acting insulin/diet adjustment   -Last LD and Tg are as follows: Lab Results  Component Value Date   LDLCALC 44 11/05/2018    Lab Results  Component Value Date   TRIG 90 11/05/2018   -On atorvastatin 80 mg QD -Follow low fat diet and exercise   -Blood pressure goal <140/90 -  Microalbumin/creatinine goal is < 30 -Last MA/Cr is as follows: Lab Results  Component Value Date   MICROALBUR 0.6 12/25/2022   -not on ACE/ARB  -diet changes including salt restriction -limit eating outside -counseled BP targets per standards of diabetes care -uncontrolled blood pressure can lead to retinopathy, nephropathy and cardiovascular and atherosclerotic heart disease  Reviewed and counseled on: -A1C target -Blood sugar targets -Complications of uncontrolled diabetes  -Checking blood sugar before meals and bedtime and bring log next visit -All medications with mechanism of action and side effects -Hypoglycemia management: rule of 15's, Glucagon Emergency Kit and medical alert ID -low-carb low-fat plate-method diet -At least 20 minutes of physical activity per day -Annual dilated retinal eye exam and foot exam -compliance and follow up needs -follow up as scheduled or earlier if problem gets worse  Call if blood sugar is less than 70 or consistently above 250    Take a 15 gm snack of carbohydrate at bedtime before you go to sleep if your blood sugar is less than 100.    If you are going to fast after midnight for a test or procedure, ask your physician for instructions on how to reduce/decrease your insulin dose.    Call if blood sugar is less than 70 or consistently above 250  -Treating a low sugar by rule of 15  (15 gms of sugar every 15 min until sugar is more than 70) If you feel your sugar is  low, test your sugar to be sure If your sugar is low (less than 70), then take 15 grams of a fast acting Carbohydrate (3-4 glucose tablets or glucose gel or 4 ounces of juice or regular soda) Recheck your sugar 15 min after treating low to make sure it is more than 70 If sugar is still less than 70, treat again with 15 grams of carbohydrate          Don't drive the hour of hypoglycemia  If unconscious/unable to eat or drink by mouth, use glucagon injection or nasal spray  baqsimi and call 911. Can repeat again in 15 min if still unconscious.  Return in about 3 months (around 04/26/2023).   I have reviewed current medications, nurse's notes, allergies, vital signs, past medical and surgical history, family medical history, and social history for this encounter. Counseled patient on symptoms, examination findings, lab findings, imaging results, treatment decisions and monitoring and prognosis. The patient understood the recommendations and agrees with the treatment plan. All questions regarding treatment plan were fully answered.  Tony Tobias, MD  01/26/23    History of Present Illness [name] is a 89 y.o. year old male who presents for follow up of Type II diabetes mellitus.  [name] was first diagnosed >20 years ago. Diabetes education +  Home diabetes regimen: Lantus 15 unit qam  Jardiance 10 mg every day  Januvia 100 mg every day Nateglinide 60 mg 2 pills before break fast and 60 mg before dinner   COMPLICATIONS +  MI/Stroke -  retinopathy -  neuropathy -  nephropathy  BLOOD SUGAR DATA  CGM interpretation: At today's visit, we reviewed her CGM downloads. The full report is scanned in the media. Reviewing the CGM trends, BG are elevated all day, except improved overnight.    Physical Exam  BP 92/64   Pulse 61   Resp 14   Ht 6' (1.829 m)   Wt 184 lb 12.8 oz (83.8 kg)   SpO2 99%   BMI 25.06 kg/m    Constitutional: well developed, well nourished Head: normocephalic, atraumatic Eyes: sclera anicteric, no redness Neck: supple Lungs: normal respiratory effort Neurology: alert and oriented Skin: dry, no appreciable rashes Musculoskeletal: no appreciable defects Psychiatric: normal mood and affect Diabetic Foot Exam - Simple   No data filed      Current Medications Patient's Medications  New Prescriptions   BLOOD GLUCOSE MONITORING SUPPL DEVI    1 each by Does not apply route in the morning, at noon, and at  bedtime. May substitute to any manufacturer covered by patient's insurance.   GLUCOSE BLOOD (BLOOD GLUCOSE TEST STRIPS) STRP    1 each by In Vitro route in the morning, at noon, and at bedtime. May substitute to any manufacturer covered by patient's insurance.   NATEGLINIDE (STARLIX) 120 MG TABLET    Take 1 tablet (120 mg total) by mouth 3 (three) times daily with meals.  Previous Medications   ATORVASTATIN (LIPITOR) 80 MG TABLET    TAKE ONE TABLET BY MOUTH EVERY DAY FOR CHOLESTEROL   CETIRIZINE (ZYRTEC) 10 MG TABLET    Take 10 mg by mouth daily.   CONTINUOUS GLUCOSE SENSOR (DEXCOM G7 SENSOR) MISC    1 Device by Does not apply route continuous.   CONTINUOUS GLUCOSE SENSOR (DEXCOM G7 SENSOR) MISC    1 Device by Does not apply route continuous. 1 sensor every 10 days as prescribed   DIGOXIN (LANOXIN) 0.125 MG TABLET  Take 1 tablet (0.125 mg total) by mouth daily.   ELIQUIS 5 MG TABS TABLET    TAKE ONE TABLET BY MOUTH TWICE DAILY   EMPAGLIFLOZIN (JARDIANCE) 10 MG TABS TABLET    Take 10 mg by mouth in the morning.   EPIPEN 2-PAK 0.3 MG/0.3ML SOAJ INJECTION    Inject 0.3 mg into the muscle daily as needed for anaphylaxis.    ERENUMAB-AOOE (AIMOVIG) 140 MG/ML SOAJ    Inject 140 mg into the skin every 28 (twenty-eight) days.   FLUTICASONE (FLONASE) 50 MCG/ACT NASAL SPRAY    Place 1 spray into the nose daily as needed for rhinitis or allergies.   IBUPROFEN (ADVIL) 200 MG TABLET    Take 400 mg by mouth every 8 (eight) hours as needed (headaches/migraine).   INSULIN GLARGINE (LANTUS SOLOSTAR) 100 UNIT/ML SOLOSTAR PEN    Start Lantus 10 units once every morning. Increase by 1 units a every day until fasting blood sugar is less than 150. Stay on that dose.   INSULIN PEN NEEDLE 32G X 4 MM MISC    1 Device by Does not apply route daily.   MAGNESIUM 250 MG TABS    Take 250 mg by mouth in the morning.   METOPROLOL SUCCINATE (TOPROL-XL) 25 MG 24 HR TABLET    TAKE ONE TABLET BY MOUTH TWICE DAILY WITH OR  immediately following A MEAL   ONETOUCH VERIO TEST STRIP    SMARTSIG:Via Meter 1-2 Times Daily   POTASSIUM CHLORIDE (KLOR-CON) 10 MEQ TABLET    Take 1 tablet (10 mEq total) by mouth daily.   SITAGLIPTIN (JANUVIA) 100 MG TABLET    Take 100 mg by mouth at bedtime.   TORSEMIDE (DEMADEX) 20 MG TABLET    Take 1 tablet (20 mg total) by mouth daily.  Modified Medications   No medications on file  Discontinued Medications   NATEGLINIDE (STARLIX) 120 MG TABLET    Take 120 mg by mouth daily with breakfast. Per patient taking 2 tablets at breakfast   NATEGLINIDE (STARLIX) 60 MG TABLET    Take 60 mg by mouth at bedtime.    Allergies Allergies  Allergen Reactions   Bee Venom Anaphylaxis   Latex Other (See Comments) and Rash    REDNESS AT REACTION SITE.   Metformin Diarrhea   Rivaroxaban Other (See Comments)    Headaches, blurred vision  Other Reaction(s): Dizziness   Sotalol Other (See Comments)    "BLUE TOES"- cut his circulation off   Levofloxacin     long QT syndrome, so avoid   Warfarin Sodium Other (See Comments)    migraine headaches/vision impariment    Past Medical History Past Medical History:  Diagnosis Date   Acute on chronic combined systolic (congestive) and diastolic (congestive) heart failure (HCC)    AICD (automatic cardioverter/defibrillator) present    [employer]n PPM/ICD   Carotid stenosis, right    s/p endarterectomy 11/07/18   Chronic combined systolic (congestive) and diastolic (congestive) heart failure (HCC)    Coronary artery disease    [facility]   Diabetes mellitus    Dysrhythmia    Endocarditis, valve unspecified    Mitral   Headache    hx migraines 6-7 x mo   History of migraine headaches    History of seasonal allergies    ICD (implantable cardiac defibrillator) in place    Optic nerve hemorrhage, 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  We  will monitor this closely and look for changes over the ensuing 6 months.  Repeat visit here in 6 months   PAF (paroxysmal atrial fibrillation) (HCC)    NO LAA occlusion at the time of surgery  [name] 2018   Pleural effusion    PVC's (premature ventricular contractions)    Ventricular tachycardia, polymorphic (HCC)    status post ICD implantation    Past Surgical History Past Surgical History:  Procedure Laterality Date   BIV UPGRADE N/A 03/22/2022   Procedure: BIV UPGRADE;  Surgeon: Duke Salvia, MD;  Location[facility] INVASIVE CV LAB;  Service: Cardiovascular;  Laterality: N/A;   BRONCHIAL BIOPSY  07/22/2020   Procedure: BRONCHIAL BIOPSIES;  Surgeon: Leslye Peer, MD;  Location: [facility] ENDOSCOPY;  Service: Cardiopulmonary;;   BRONCHIAL BRUSHINGS  07/22/2020   Procedure: BRONCHIAL BRUSHINGS;  Surgeon: Leslye Peer, MD;  Location: [facility] ENDOSCOPY;  Service: Cardiopulmonary;;   BRONCHIAL WASHINGS  07/22/2020   Procedure: BRONCHIAL WASHINGS;  Surgeon: Leslye Peer, MD;  Location: [facility] ENDOSCOPY;  Service: Cardiopulmonary;;   CARDIAC CATHETERIZATION  04/03/2007   EF 40%   CARDIAC CATHETERIZATION  02/06/2006   EF 45%. ANTERIOR HYPOKINESIS   CARDIAC CATHETERIZATION  05/29/2000   EF 50%. MILD ANTERIOR HYPOKINESIS   CARDIAC CATHETERIZATION  10/25/1999   EF 50%. SEVERE MITRAL REGURGITATION   CARDIAC CATHETERIZATION  01/06/2003   EF 50%   CARDIAC DEFIBRILLATOR PLACEMENT     CARDIOVERSION N/A 02/07/2019   Procedure: CARDIOVERSION;  Surgeon: Pricilla Riffle, MD;  Location: [facility] ENDOSCOPY;  Service: Cardiovascular;  Laterality: N/A;   CARDIOVERSION N/A 11/24/2019   Procedure: CARDIOVERSION;  Surgeon: Wendall Stade, MD;  Location: [facility] ENDOSCOPY;  Service: Cardiovascular;  Laterality: N/A;   CARDIOVERSION N/A 02/15/2022   Procedure: CARDIOVERSION;  Surgeon: Jake Bathe, MD;  Location: [facility] ENDOSCOPY;  Service: Cardiovascular;  Laterality: N/A;   CARDIOVERSION N/A 03/03/2022   Procedure: CARDIOVERSION;   Surgeon: Parke Poisson, MD;  Location: [facility] ENDOSCOPY;  Service: Cardiovascular;  Laterality: N/A;   CATARACT EXTRACTION W/ INTRAOCULAR LENS  IMPLANT, BILATERAL     CHOLECYSTECTOMY N/A 01/08/2019   Procedure: LAPAROSCOPIC CHOLECYSTECTOMY WITH INTRAOPERATIVE CHOLANGIOGRAM;  Surgeon: Manus Rudd, MD;  Location: [facility] OR;  Service: General;  Laterality: N/A;   CORONARY ARTERY BYPASS GRAFT  2001   2 VESSEL CABG AND MITRAL VALVE REPAIR. HE HAD A LIMA GRAFT TO THE LAD AND RADIAL ARTERY  GRAFT TO THE OBTUSE MARGINAL  OF LEFT CIRCUMFLEX   CORONARY PRESSURE/FFR STUDY N/A 01/05/2017   Procedure: INTRAVASCULAR PRESSURE WIRE/FFR STUDY;  Surgeon: [name], MD;  Location: [facility] INVASIVE CV LAB;  Service: Cardiovascular;  Laterality: N/A;   ENDARTERECTOMY Right 11/07/2018   Procedure: ENDARTERECTOMY CAROTID RIGHT;  Surgeon: Maeola Harman, MD;  [facility]: [facility] OR;  Service: Vascular;  Laterality: Right;   EP IMPLANTABLE DEVICE N/A 11/06/2014   Procedure: ICD Generator Changeout;  Surgeon: Duke Salvia, MD;  Location: [facility] INVASIVE CV LAB;  Service: Cardiovascular;  Laterality: N/A;   ERCP N/A 01/10/2019   Procedure: ENDOSCOPIC RETROGRADE CHOLANGIOPANCREATOGRAPHY (ERCP);  Surgeon: Vida Rigger, MD;  Location: [facility] ENDOSCOPY;  Service: Endoscopy;  Laterality: N/A;   FOREIGN BODY REMOVAL Right 06/21/2017   Procedure: FOREIGN BODY REMOVAL ADULT RIGHT RING FINGER;  Surgeon: Betha Loa, MD;  Location: [facility] OR;  Service: Orthopedics;  Laterality: Right;   HEMORRHOID SURGERY     HEMORROIDECTOMY     KNEE ARTHROSCOPY     RIGHT  KNEE   LAPAROSCOPIC APPENDECTOMY N/A 01/08/2019   Procedure: APPENDECTOMY LAPAROSCOPIC;  Surgeon: Manus Rudd, MD;  Location: [facility] OR;  Service: General;  Laterality: N/A;   LEFT HEART CATHETERIZATION WITH CORONARY ANGIOGRAM N/A 07/04/2012   Procedure: LEFT HEART CATHETERIZATION WITH CORONARY ANGIOGRAM;  Surgeon: [name], MD;  Location: [facility] CATH LAB;  Service: Cardiovascular;   Laterality: N/A;   MITRAL VALVE REPLACEMENT     No LAA occlusion at the time of surgery  Tamela Oddi report 2018   REMOVAL OF STONES  01/10/2019   Procedure: REMOVAL OF STONES;  Surgeon: Vida Rigger, MD;  Location: [facility] ENDOSCOPY;  Service: Endoscopy;;   RIGHT/LEFT HEART CATH AND CORONARY/GRAFT ANGIOGRAPHY N/A 01/05/2017   Procedure: RIGHT/LEFT HEART CATH AND CORONARY/GRAFT ANGIOGRAPHY;  Surgeon: [name], MD;  Location: [facility] INVASIVE CV LAB;  Service: Cardiovascular;  Laterality: N/A;   SPHINCTEROTOMY  01/10/2019   Procedure: SPHINCTEROTOMY;  Surgeon: Vida Rigger, MD;  Location: [facility] ENDOSCOPY;  Service: Endoscopy;;   TRANSTHORACIC ECHOCARDIOGRAM  04/02/2007   EF 35%   VIDEO BRONCHOSCOPY Right 07/22/2020   Procedure: VIDEO BRONCHOSCOPY WITH FLUORO, BAL AND BIOPSY;  Surgeon: Leslye Peer, MD;  Location: [facility] ENDOSCOPY;  Service: Cardiopulmonary;  Laterality: Right;  CONSCIOUS SEDATION OR MAC    Family History family history includes Cancer in his sister and another family member; Diabetes in an other family member; Emphysema in his father; Heart disease in his father, paternal grandfather, and paternal grandmother; Stroke in his mother.  Social History Social History   Socioeconomic History   Marital status: Married    Spouse name: Elease Hashimoto   Number of children: 2   Years of education: Not on file   Highest education level: Associate degree: academic program  Occupational History   Occupation: retired  Tobacco Use   Smoking status: Former    Current packs/day: 0.00    Average packs/day: 2.0 packs/day for 16.0 years (32.1 ttl pk-yrs)    Types: Cigarettes    Start date: 02/23/1956    Quit date: 01/31/1971    Years since quitting: 52.0   Smokeless tobacco: Never  Vaping Use   Vaping status: Never Used  Substance and Sexual Activity   Alcohol use: Yes    Alcohol/week: 0.0 standard drinks of alcohol    Comment: 1-2 a week   Drug use: No   Sexual activity: Yes  Other Topics Concern    Not on file  Social History Narrative   Patient is right-handed. He lives with his wife ina 2 story home. He drinks one cup of coffee and a diet coke a day.    Social Drivers of Corporate investment banker Strain: Not on file  Food Insecurity: Not on file  Transportation Needs: Not on file  Physical Activity: Not on file  Stress: Not on file  Social Connections: Not on file  Intimate Partner Violence: Not on file    Lab Results  Component Value Date   HGBA1C 9.4 (A) 01/26/2023   HGBA1C 9.1 (A) 12/25/2022   HGBA1C 6.8 (H) 11/04/2018   Lab Results  Component Value Date   CHOL 110 11/05/2018   Lab Results  Component Value Date   HDL 48 11/05/2018   Lab Results  Component Value Date   LDLCALC 44 11/05/2018   Lab Results  Component Value Date   TRIG 90 11/05/2018   Lab Results  Component Value Date   CHOLHDL 2.3 11/05/2018   Lab Results  Component Value Date   CREATININE 1.17  12/25/2022   Lab Results  Component Value Date   GFR 77.10 10/30/2014   Lab Results  Component Value Date   MICROALBUR 0.6 12/25/2022      Component Value Date/Time   NA 133 (L) 12/25/2022 1431   NA 134 12/05/2022 0852   K 4.4 12/25/2022 1431   CL 94 (L) 12/25/2022 1431   CO2 29 12/25/2022 1431   GLUCOSE 380 (H) 12/25/2022 1431   BUN 27 (H) 12/25/2022 1431   BUN 25 12/05/2022 0852   CREATININE 1.17 12/25/2022 1431   CALCIUM 9.3 12/25/2022 1431   PROT 8.2 (H) 12/25/2022 1431   PROT 7.4 03/14/2022 1627   ALBUMIN 4.5 03/14/2022 1627   AST 29 12/25/2022 1431   ALT 28 12/25/2022 1431   ALKPHOS 183 (H) 03/14/2022 1627   BILITOT 1.7 (H) 12/25/2022 1431   BILITOT 3.0 (H) 03/14/2022 1627   GFRNONAA >60 07/14/2020 1124   GFRAA 74 09/24/2019 1139      Latest Ref Rng & Units 12/25/2022    2:31 PM 12/05/2022    8:52 AM 11/10/2022   11:30 AM  BMP  Glucose 65 - 99 mg/dL 161  096  045   BUN 7 - 25 mg/dL 27  25  21    Creatinine 0.70 - 1.22 mg/dL 4.09  8.11  9.14   BUN/Creat Ratio 6 -  22 (calc) 23  22  20    Sodium 135 - 146 mmol/L 133  134  133   Potassium 3.5 - 5.3 mmol/L 4.4  5.3  4.7   Chloride 98 - 110 mmol/L 94  93  98   CO2 20 - 32 mmol/L 29  27  24    Calcium 8.6 - 10.3 mg/dL 9.3  9.4  9.0        Component Value Date/Time   WBC 6.8 11/10/2022 1130   WBC 6.6 07/14/2020 1124   RBC 4.43 11/10/2022 1130   RBC 4.30 07/14/2020 1124   HGB 15.3 11/10/2022 1130   HCT 47.5 11/10/2022 1130   PLT 97 (LL) 11/10/2022 1130   MCV 107 (H) 11/10/2022 1130   MCH 34.5 (H) 11/10/2022 1130   MCH 32.8 07/14/2020 1124   MCHC 32.2 11/10/2022 1130   MCHC 32.4 07/14/2020 1124   RDW 12.9 11/10/2022 1130   LYMPHSABS 0.6 (L) 07/14/2020 1124   LYMPHSABS 0.9 12/27/2016 1523   MONOABS 0.6 07/14/2020 1124   EOSABS 0.3 07/14/2020 1124   EOSABS 0.2 12/27/2016 1523   BASOSABS 0.0 07/14/2020 1124   BASOSABS 0.0 12/27/2016 1523     Parts of this note may have been dictated using voice recognition software. There may be variances in spelling and vocabulary which are unintentional. Not all errors are proofread. Please notify the Thereasa Parkin if any discrepancies are noted or if the meaning of any statement is not clear.

## 2023-01-29 ENCOUNTER — Ambulatory Visit: Payer: Medicare Other | Admitting: "Endocrinology

## 2023-01-29 DIAGNOSIS — Z950 Presence of cardiac pacemaker: Secondary | ICD-10-CM | POA: Diagnosis not present

## 2023-01-29 DIAGNOSIS — E785 Hyperlipidemia, unspecified: Secondary | ICD-10-CM | POA: Diagnosis not present

## 2023-01-29 DIAGNOSIS — J449 Chronic obstructive pulmonary disease, unspecified: Secondary | ICD-10-CM | POA: Diagnosis not present

## 2023-01-29 DIAGNOSIS — I519 Heart disease, unspecified: Secondary | ICD-10-CM | POA: Diagnosis not present

## 2023-01-29 DIAGNOSIS — H3530 Unspecified macular degeneration: Secondary | ICD-10-CM | POA: Diagnosis not present

## 2023-01-29 DIAGNOSIS — I509 Heart failure, unspecified: Secondary | ICD-10-CM | POA: Diagnosis not present

## 2023-01-31 DIAGNOSIS — N189 Chronic kidney disease, unspecified: Secondary | ICD-10-CM | POA: Diagnosis not present

## 2023-01-31 DIAGNOSIS — H3530 Unspecified macular degeneration: Secondary | ICD-10-CM | POA: Diagnosis not present

## 2023-01-31 DIAGNOSIS — Z7901 Long term (current) use of anticoagulants: Secondary | ICD-10-CM | POA: Diagnosis not present

## 2023-01-31 DIAGNOSIS — I10 Essential (primary) hypertension: Secondary | ICD-10-CM | POA: Diagnosis not present

## 2023-01-31 DIAGNOSIS — J449 Chronic obstructive pulmonary disease, unspecified: Secondary | ICD-10-CM | POA: Diagnosis not present

## 2023-01-31 DIAGNOSIS — Z950 Presence of cardiac pacemaker: Secondary | ICD-10-CM | POA: Diagnosis not present

## 2023-01-31 DIAGNOSIS — Z8673 Personal history of transient ischemic attack (TIA), and cerebral infarction without residual deficits: Secondary | ICD-10-CM | POA: Diagnosis not present

## 2023-01-31 DIAGNOSIS — I714 Abdominal aortic aneurysm, without rupture, unspecified: Secondary | ICD-10-CM | POA: Diagnosis not present

## 2023-01-31 DIAGNOSIS — E118 Type 2 diabetes mellitus with unspecified complications: Secondary | ICD-10-CM | POA: Diagnosis not present

## 2023-01-31 DIAGNOSIS — I509 Heart failure, unspecified: Secondary | ICD-10-CM | POA: Diagnosis not present

## 2023-01-31 DIAGNOSIS — E785 Hyperlipidemia, unspecified: Secondary | ICD-10-CM | POA: Diagnosis not present

## 2023-01-31 DIAGNOSIS — I480 Paroxysmal atrial fibrillation: Secondary | ICD-10-CM | POA: Diagnosis not present

## 2023-01-31 DIAGNOSIS — I519 Heart disease, unspecified: Secondary | ICD-10-CM | POA: Diagnosis not present

## 2023-02-01 DIAGNOSIS — I519 Heart disease, unspecified: Secondary | ICD-10-CM | POA: Diagnosis not present

## 2023-02-01 DIAGNOSIS — H3530 Unspecified macular degeneration: Secondary | ICD-10-CM | POA: Diagnosis not present

## 2023-02-01 DIAGNOSIS — I509 Heart failure, unspecified: Secondary | ICD-10-CM | POA: Diagnosis not present

## 2023-02-01 DIAGNOSIS — Z950 Presence of cardiac pacemaker: Secondary | ICD-10-CM | POA: Diagnosis not present

## 2023-02-01 DIAGNOSIS — E785 Hyperlipidemia, unspecified: Secondary | ICD-10-CM | POA: Diagnosis not present

## 2023-02-01 DIAGNOSIS — J449 Chronic obstructive pulmonary disease, unspecified: Secondary | ICD-10-CM | POA: Diagnosis not present

## 2023-02-05 ENCOUNTER — Other Ambulatory Visit: Payer: Self-pay | Admitting: Internal Medicine

## 2023-02-07 DIAGNOSIS — I519 Heart disease, unspecified: Secondary | ICD-10-CM | POA: Diagnosis not present

## 2023-02-07 DIAGNOSIS — I509 Heart failure, unspecified: Secondary | ICD-10-CM | POA: Diagnosis not present

## 2023-02-07 DIAGNOSIS — J449 Chronic obstructive pulmonary disease, unspecified: Secondary | ICD-10-CM | POA: Diagnosis not present

## 2023-02-07 DIAGNOSIS — Z950 Presence of cardiac pacemaker: Secondary | ICD-10-CM | POA: Diagnosis not present

## 2023-02-07 DIAGNOSIS — E785 Hyperlipidemia, unspecified: Secondary | ICD-10-CM | POA: Diagnosis not present

## 2023-02-07 DIAGNOSIS — H3530 Unspecified macular degeneration: Secondary | ICD-10-CM | POA: Diagnosis not present

## 2023-02-07 MED ORDER — DIGOXIN 125 MCG PO TABS: 0.1250 mg | ORAL_TABLET | Freq: Every day | ORAL | 2 refills | Status: DC

## 2023-02-09 ENCOUNTER — Ambulatory Visit: Payer: Medicare Other | Admitting: Physician Assistant

## 2023-02-15 ENCOUNTER — Ambulatory Visit: Payer: Medicare Other | Admitting: Nutrition

## 2023-02-21 DIAGNOSIS — I519 Heart disease, unspecified: Secondary | ICD-10-CM | POA: Diagnosis not present

## 2023-02-21 DIAGNOSIS — Z950 Presence of cardiac pacemaker: Secondary | ICD-10-CM | POA: Diagnosis not present

## 2023-02-21 DIAGNOSIS — H3530 Unspecified macular degeneration: Secondary | ICD-10-CM | POA: Diagnosis not present

## 2023-02-21 DIAGNOSIS — J449 Chronic obstructive pulmonary disease, unspecified: Secondary | ICD-10-CM | POA: Diagnosis not present

## 2023-02-21 DIAGNOSIS — E785 Hyperlipidemia, unspecified: Secondary | ICD-10-CM | POA: Diagnosis not present

## 2023-02-21 DIAGNOSIS — I509 Heart failure, unspecified: Secondary | ICD-10-CM | POA: Diagnosis not present

## 2023-02-22 ENCOUNTER — Telehealth: Payer: Self-pay

## 2023-02-22 DIAGNOSIS — J449 Chronic obstructive pulmonary disease, unspecified: Secondary | ICD-10-CM | POA: Diagnosis not present

## 2023-02-22 DIAGNOSIS — I519 Heart disease, unspecified: Secondary | ICD-10-CM | POA: Diagnosis not present

## 2023-02-22 DIAGNOSIS — E785 Hyperlipidemia, unspecified: Secondary | ICD-10-CM | POA: Diagnosis not present

## 2023-02-22 DIAGNOSIS — Z950 Presence of cardiac pacemaker: Secondary | ICD-10-CM | POA: Diagnosis not present

## 2023-02-22 DIAGNOSIS — H3530 Unspecified macular degeneration: Secondary | ICD-10-CM | POA: Diagnosis not present

## 2023-02-22 DIAGNOSIS — I509 Heart failure, unspecified: Secondary | ICD-10-CM | POA: Diagnosis not present

## 2023-02-22 NOTE — Telephone Encounter (Signed)
[employer] received. End date 01/30/2024

## 2023-02-28 DIAGNOSIS — J449 Chronic obstructive pulmonary disease, unspecified: Secondary | ICD-10-CM | POA: Diagnosis not present

## 2023-02-28 DIAGNOSIS — E785 Hyperlipidemia, unspecified: Secondary | ICD-10-CM | POA: Diagnosis not present

## 2023-02-28 DIAGNOSIS — I509 Heart failure, unspecified: Secondary | ICD-10-CM | POA: Diagnosis not present

## 2023-02-28 DIAGNOSIS — I519 Heart disease, unspecified: Secondary | ICD-10-CM | POA: Diagnosis not present

## 2023-02-28 DIAGNOSIS — Z950 Presence of cardiac pacemaker: Secondary | ICD-10-CM | POA: Diagnosis not present

## 2023-02-28 DIAGNOSIS — H3530 Unspecified macular degeneration: Secondary | ICD-10-CM | POA: Diagnosis not present

## 2023-03-03 DIAGNOSIS — I714 Abdominal aortic aneurysm, without rupture, unspecified: Secondary | ICD-10-CM | POA: Diagnosis not present

## 2023-03-03 DIAGNOSIS — H3530 Unspecified macular degeneration: Secondary | ICD-10-CM | POA: Diagnosis not present

## 2023-03-03 DIAGNOSIS — I480 Paroxysmal atrial fibrillation: Secondary | ICD-10-CM | POA: Diagnosis not present

## 2023-03-03 DIAGNOSIS — Z7901 Long term (current) use of anticoagulants: Secondary | ICD-10-CM | POA: Diagnosis not present

## 2023-03-03 DIAGNOSIS — Z950 Presence of cardiac pacemaker: Secondary | ICD-10-CM | POA: Diagnosis not present

## 2023-03-03 DIAGNOSIS — E785 Hyperlipidemia, unspecified: Secondary | ICD-10-CM | POA: Diagnosis not present

## 2023-03-03 DIAGNOSIS — E118 Type 2 diabetes mellitus with unspecified complications: Secondary | ICD-10-CM | POA: Diagnosis not present

## 2023-03-03 DIAGNOSIS — N189 Chronic kidney disease, unspecified: Secondary | ICD-10-CM | POA: Diagnosis not present

## 2023-03-03 DIAGNOSIS — J449 Chronic obstructive pulmonary disease, unspecified: Secondary | ICD-10-CM | POA: Diagnosis not present

## 2023-03-03 DIAGNOSIS — I10 Essential (primary) hypertension: Secondary | ICD-10-CM | POA: Diagnosis not present

## 2023-03-03 DIAGNOSIS — I509 Heart failure, unspecified: Secondary | ICD-10-CM | POA: Diagnosis not present

## 2023-03-03 DIAGNOSIS — Z8673 Personal history of transient ischemic attack (TIA), and cerebral infarction without residual deficits: Secondary | ICD-10-CM | POA: Diagnosis not present

## 2023-03-03 DIAGNOSIS — I519 Heart disease, unspecified: Secondary | ICD-10-CM | POA: Diagnosis not present

## 2023-03-07 DIAGNOSIS — H3530 Unspecified macular degeneration: Secondary | ICD-10-CM | POA: Diagnosis not present

## 2023-03-07 DIAGNOSIS — Z950 Presence of cardiac pacemaker: Secondary | ICD-10-CM | POA: Diagnosis not present

## 2023-03-07 DIAGNOSIS — E785 Hyperlipidemia, unspecified: Secondary | ICD-10-CM | POA: Diagnosis not present

## 2023-03-07 DIAGNOSIS — I509 Heart failure, unspecified: Secondary | ICD-10-CM | POA: Diagnosis not present

## 2023-03-07 DIAGNOSIS — J449 Chronic obstructive pulmonary disease, unspecified: Secondary | ICD-10-CM | POA: Diagnosis not present

## 2023-03-07 DIAGNOSIS — I519 Heart disease, unspecified: Secondary | ICD-10-CM | POA: Diagnosis not present

## 2023-03-14 DIAGNOSIS — H3530 Unspecified macular degeneration: Secondary | ICD-10-CM | POA: Diagnosis not present

## 2023-03-14 DIAGNOSIS — Z950 Presence of cardiac pacemaker: Secondary | ICD-10-CM | POA: Diagnosis not present

## 2023-03-14 DIAGNOSIS — I509 Heart failure, unspecified: Secondary | ICD-10-CM | POA: Diagnosis not present

## 2023-03-14 DIAGNOSIS — E785 Hyperlipidemia, unspecified: Secondary | ICD-10-CM | POA: Diagnosis not present

## 2023-03-14 DIAGNOSIS — J449 Chronic obstructive pulmonary disease, unspecified: Secondary | ICD-10-CM | POA: Diagnosis not present

## 2023-03-14 DIAGNOSIS — I519 Heart disease, unspecified: Secondary | ICD-10-CM | POA: Diagnosis not present

## 2023-03-19 DIAGNOSIS — H3530 Unspecified macular degeneration: Secondary | ICD-10-CM | POA: Diagnosis not present

## 2023-03-19 DIAGNOSIS — E785 Hyperlipidemia, unspecified: Secondary | ICD-10-CM | POA: Diagnosis not present

## 2023-03-19 DIAGNOSIS — J449 Chronic obstructive pulmonary disease, unspecified: Secondary | ICD-10-CM | POA: Diagnosis not present

## 2023-03-19 DIAGNOSIS — I509 Heart failure, unspecified: Secondary | ICD-10-CM | POA: Diagnosis not present

## 2023-03-19 DIAGNOSIS — Z950 Presence of cardiac pacemaker: Secondary | ICD-10-CM | POA: Diagnosis not present

## 2023-03-19 DIAGNOSIS — I519 Heart disease, unspecified: Secondary | ICD-10-CM | POA: Diagnosis not present

## 2023-03-22 ENCOUNTER — Other Ambulatory Visit: Payer: Self-pay

## 2023-03-22 ENCOUNTER — Ambulatory Visit (INDEPENDENT_AMBULATORY_CARE_PROVIDER_SITE_OTHER): Payer: PRIVATE HEALTH INSURANCE

## 2023-03-22 DIAGNOSIS — I509 Heart failure, unspecified: Secondary | ICD-10-CM | POA: Diagnosis not present

## 2023-03-22 DIAGNOSIS — Z950 Presence of cardiac pacemaker: Secondary | ICD-10-CM | POA: Diagnosis not present

## 2023-03-22 DIAGNOSIS — H3530 Unspecified macular degeneration: Secondary | ICD-10-CM | POA: Diagnosis not present

## 2023-03-22 DIAGNOSIS — I519 Heart disease, unspecified: Secondary | ICD-10-CM | POA: Diagnosis not present

## 2023-03-22 DIAGNOSIS — I255 Ischemic cardiomyopathy: Secondary | ICD-10-CM | POA: Diagnosis not present

## 2023-03-22 DIAGNOSIS — J449 Chronic obstructive pulmonary disease, unspecified: Secondary | ICD-10-CM | POA: Diagnosis not present

## 2023-03-22 DIAGNOSIS — E785 Hyperlipidemia, unspecified: Secondary | ICD-10-CM | POA: Diagnosis not present

## 2023-03-23 ENCOUNTER — Encounter: Payer: Self-pay | Admitting: "Endocrinology

## 2023-03-23 DIAGNOSIS — Z794 Long term (current) use of insulin: Secondary | ICD-10-CM

## 2023-03-23 LAB — CUP PACEART REMOTE DEVICE CHECK
Battery Remaining Longevity: 98 mo
Battery Voltage: 3 V
Brady Statistic RA Percent Paced: 0 %
Brady Statistic RV Percent Paced: 89.52 %
Date Time Interrogation Session: 20250219194235
Implantable Lead Connection Status: 753985
Implantable Lead Connection Status: 753985
Implantable Lead Connection Status: 753985
Implantable Lead Implant Date: 20040309
Implantable Lead Implant Date: 20040309
Implantable Lead Implant Date: 20240221
Implantable Lead Location: 753858
Implantable Lead Location: 753859
Implantable Lead Location: 753860
Implantable Lead Model: 158
Implantable Lead Model: 158
Implantable Lead Model: 5076
Implantable Lead Serial Number: 115061
Implantable Pulse Generator Implant Date: 20240221
Lead Channel Impedance Value: 1064 Ohm
Lead Channel Impedance Value: 304 Ohm
Lead Channel Impedance Value: 399 Ohm
Lead Channel Impedance Value: 456 Ohm
Lead Channel Impedance Value: 475 Ohm
Lead Channel Impedance Value: 475 Ohm
Lead Channel Impedance Value: 494 Ohm
Lead Channel Impedance Value: 513 Ohm
Lead Channel Impedance Value: 627 Ohm
Lead Channel Impedance Value: 703 Ohm
Lead Channel Impedance Value: 836 Ohm
Lead Channel Impedance Value: 874 Ohm
Lead Channel Impedance Value: 893 Ohm
Lead Channel Impedance Value: 969 Ohm
Lead Channel Pacing Threshold Amplitude: 0.75 V
Lead Channel Pacing Threshold Amplitude: 1.5 V
Lead Channel Pacing Threshold Pulse Width: 0.4 ms
Lead Channel Pacing Threshold Pulse Width: 1 ms
Lead Channel Sensing Intrinsic Amplitude: 1.375 mV
Lead Channel Sensing Intrinsic Amplitude: 1.375 mV
Lead Channel Sensing Intrinsic Amplitude: 6.125 mV
Lead Channel Sensing Intrinsic Amplitude: 6.125 mV
Lead Channel Setting Pacing Amplitude: 2.5 V
Lead Channel Setting Pacing Amplitude: 2.5 V
Lead Channel Setting Pacing Pulse Width: 0.4 ms
Lead Channel Setting Pacing Pulse Width: 1 ms
Lead Channel Setting Sensing Sensitivity: 0.9 mV
Zone Setting Status: 755011
Zone Setting Status: 755011

## 2023-03-26 ENCOUNTER — Other Ambulatory Visit: Payer: Self-pay

## 2023-03-26 DIAGNOSIS — Z950 Presence of cardiac pacemaker: Secondary | ICD-10-CM | POA: Diagnosis not present

## 2023-03-26 DIAGNOSIS — I509 Heart failure, unspecified: Secondary | ICD-10-CM | POA: Diagnosis not present

## 2023-03-26 DIAGNOSIS — E785 Hyperlipidemia, unspecified: Secondary | ICD-10-CM | POA: Diagnosis not present

## 2023-03-26 DIAGNOSIS — Z794 Long term (current) use of insulin: Secondary | ICD-10-CM

## 2023-03-26 DIAGNOSIS — I519 Heart disease, unspecified: Secondary | ICD-10-CM | POA: Diagnosis not present

## 2023-03-26 DIAGNOSIS — H3530 Unspecified macular degeneration: Secondary | ICD-10-CM | POA: Diagnosis not present

## 2023-03-26 DIAGNOSIS — J449 Chronic obstructive pulmonary disease, unspecified: Secondary | ICD-10-CM | POA: Diagnosis not present

## 2023-03-26 MED ORDER — LINAGLIPTIN 5 MG PO TABS: 5.0000 mg | ORAL_TABLET | Freq: Every day | ORAL | 1 refills | Status: DC

## 2023-03-26 MED ORDER — LINAGLIPTIN 5 MG PO TABS
5.0000 mg | ORAL_TABLET | Freq: Every day | ORAL | Status: DC
Start: 2023-03-26 — End: 2023-03-26

## 2023-03-28 DIAGNOSIS — Z950 Presence of cardiac pacemaker: Secondary | ICD-10-CM | POA: Diagnosis not present

## 2023-03-28 DIAGNOSIS — I519 Heart disease, unspecified: Secondary | ICD-10-CM | POA: Diagnosis not present

## 2023-03-28 DIAGNOSIS — I509 Heart failure, unspecified: Secondary | ICD-10-CM | POA: Diagnosis not present

## 2023-03-28 DIAGNOSIS — H3530 Unspecified macular degeneration: Secondary | ICD-10-CM | POA: Diagnosis not present

## 2023-03-28 DIAGNOSIS — E785 Hyperlipidemia, unspecified: Secondary | ICD-10-CM | POA: Diagnosis not present

## 2023-03-28 DIAGNOSIS — J449 Chronic obstructive pulmonary disease, unspecified: Secondary | ICD-10-CM | POA: Diagnosis not present

## 2023-03-31 DIAGNOSIS — I714 Abdominal aortic aneurysm, without rupture, unspecified: Secondary | ICD-10-CM | POA: Diagnosis not present

## 2023-03-31 DIAGNOSIS — Z950 Presence of cardiac pacemaker: Secondary | ICD-10-CM | POA: Diagnosis not present

## 2023-03-31 DIAGNOSIS — I519 Heart disease, unspecified: Secondary | ICD-10-CM | POA: Diagnosis not present

## 2023-03-31 DIAGNOSIS — J449 Chronic obstructive pulmonary disease, unspecified: Secondary | ICD-10-CM | POA: Diagnosis not present

## 2023-03-31 DIAGNOSIS — H3530 Unspecified macular degeneration: Secondary | ICD-10-CM | POA: Diagnosis not present

## 2023-03-31 DIAGNOSIS — N189 Chronic kidney disease, unspecified: Secondary | ICD-10-CM | POA: Diagnosis not present

## 2023-03-31 DIAGNOSIS — E118 Type 2 diabetes mellitus with unspecified complications: Secondary | ICD-10-CM | POA: Diagnosis not present

## 2023-03-31 DIAGNOSIS — I10 Essential (primary) hypertension: Secondary | ICD-10-CM | POA: Diagnosis not present

## 2023-03-31 DIAGNOSIS — E785 Hyperlipidemia, unspecified: Secondary | ICD-10-CM | POA: Diagnosis not present

## 2023-03-31 DIAGNOSIS — Z7901 Long term (current) use of anticoagulants: Secondary | ICD-10-CM | POA: Diagnosis not present

## 2023-03-31 DIAGNOSIS — Z8673 Personal history of transient ischemic attack (TIA), and cerebral infarction without residual deficits: Secondary | ICD-10-CM | POA: Diagnosis not present

## 2023-03-31 DIAGNOSIS — I480 Paroxysmal atrial fibrillation: Secondary | ICD-10-CM | POA: Diagnosis not present

## 2023-03-31 DIAGNOSIS — I509 Heart failure, unspecified: Secondary | ICD-10-CM | POA: Diagnosis not present

## 2023-04-02 DIAGNOSIS — H3530 Unspecified macular degeneration: Secondary | ICD-10-CM | POA: Diagnosis not present

## 2023-04-02 DIAGNOSIS — E785 Hyperlipidemia, unspecified: Secondary | ICD-10-CM | POA: Diagnosis not present

## 2023-04-02 DIAGNOSIS — J449 Chronic obstructive pulmonary disease, unspecified: Secondary | ICD-10-CM | POA: Diagnosis not present

## 2023-04-02 DIAGNOSIS — I519 Heart disease, unspecified: Secondary | ICD-10-CM | POA: Diagnosis not present

## 2023-04-02 DIAGNOSIS — I509 Heart failure, unspecified: Secondary | ICD-10-CM | POA: Diagnosis not present

## 2023-04-02 DIAGNOSIS — Z950 Presence of cardiac pacemaker: Secondary | ICD-10-CM | POA: Diagnosis not present

## 2023-04-10 DIAGNOSIS — J449 Chronic obstructive pulmonary disease, unspecified: Secondary | ICD-10-CM | POA: Diagnosis not present

## 2023-04-10 DIAGNOSIS — H3530 Unspecified macular degeneration: Secondary | ICD-10-CM | POA: Diagnosis not present

## 2023-04-10 DIAGNOSIS — E785 Hyperlipidemia, unspecified: Secondary | ICD-10-CM | POA: Diagnosis not present

## 2023-04-10 DIAGNOSIS — I509 Heart failure, unspecified: Secondary | ICD-10-CM | POA: Diagnosis not present

## 2023-04-10 DIAGNOSIS — Z950 Presence of cardiac pacemaker: Secondary | ICD-10-CM | POA: Diagnosis not present

## 2023-04-10 DIAGNOSIS — I519 Heart disease, unspecified: Secondary | ICD-10-CM | POA: Diagnosis not present

## 2023-04-11 DIAGNOSIS — E785 Hyperlipidemia, unspecified: Secondary | ICD-10-CM | POA: Diagnosis not present

## 2023-04-11 DIAGNOSIS — J449 Chronic obstructive pulmonary disease, unspecified: Secondary | ICD-10-CM | POA: Diagnosis not present

## 2023-04-11 DIAGNOSIS — Z950 Presence of cardiac pacemaker: Secondary | ICD-10-CM | POA: Diagnosis not present

## 2023-04-11 DIAGNOSIS — H3530 Unspecified macular degeneration: Secondary | ICD-10-CM | POA: Diagnosis not present

## 2023-04-11 DIAGNOSIS — I509 Heart failure, unspecified: Secondary | ICD-10-CM | POA: Diagnosis not present

## 2023-04-11 DIAGNOSIS — I519 Heart disease, unspecified: Secondary | ICD-10-CM | POA: Diagnosis not present

## 2023-04-17 ENCOUNTER — Encounter: Payer: Self-pay | Admitting: Internal Medicine

## 2023-04-18 DIAGNOSIS — I509 Heart failure, unspecified: Secondary | ICD-10-CM | POA: Diagnosis not present

## 2023-04-18 DIAGNOSIS — E785 Hyperlipidemia, unspecified: Secondary | ICD-10-CM | POA: Diagnosis not present

## 2023-04-18 DIAGNOSIS — I519 Heart disease, unspecified: Secondary | ICD-10-CM | POA: Diagnosis not present

## 2023-04-18 DIAGNOSIS — Z950 Presence of cardiac pacemaker: Secondary | ICD-10-CM | POA: Diagnosis not present

## 2023-04-18 DIAGNOSIS — H3530 Unspecified macular degeneration: Secondary | ICD-10-CM | POA: Diagnosis not present

## 2023-04-18 DIAGNOSIS — J449 Chronic obstructive pulmonary disease, unspecified: Secondary | ICD-10-CM | POA: Diagnosis not present

## 2023-04-23 ENCOUNTER — Ambulatory Visit (INDEPENDENT_AMBULATORY_CARE_PROVIDER_SITE_OTHER): Payer: PRIVATE HEALTH INSURANCE | Admitting: "Endocrinology

## 2023-04-23 ENCOUNTER — Encounter: Payer: Self-pay | Admitting: "Endocrinology

## 2023-04-23 VITALS — BP 110/80 | HR 89 | Ht 72.0 in | Wt 186.0 lb

## 2023-04-23 DIAGNOSIS — Z7984 Long term (current) use of oral hypoglycemic drugs: Secondary | ICD-10-CM | POA: Diagnosis not present

## 2023-04-23 DIAGNOSIS — Z794 Long term (current) use of insulin: Secondary | ICD-10-CM | POA: Diagnosis not present

## 2023-04-23 DIAGNOSIS — E1165 Type 2 diabetes mellitus with hyperglycemia: Secondary | ICD-10-CM

## 2023-04-23 DIAGNOSIS — E7800 Pure hypercholesterolemia, unspecified: Secondary | ICD-10-CM | POA: Diagnosis not present

## 2023-04-23 LAB — POCT GLYCOSYLATED HEMOGLOBIN (HGB A1C): Hemoglobin A1C: 7.8 % — AB (ref 4.0–5.6)

## 2023-04-23 MED ORDER — EMPAGLIFLOZIN 25 MG PO TABS: 25.0000 mg | ORAL_TABLET | Freq: Every day | ORAL | 1 refills | Status: AC

## 2023-04-23 NOTE — Progress Notes (Signed)
 Outpatient Endocrinology Note Tony [place], MD  04/23/23   [name] 09/19/1984 [id number]  Referring Provider: Charlane Ferretti, DO Primary Care Provider: Charlane Ferretti, DO Reason for consultation: Subjective   Assessment & Plan  Diagnoses and all orders for this visit:  Type 2 diabetes mellitus with hyperglycemia, with long-term current use of insulin (HCC) -     POCT glycosylated hemoglobin (Hb A1C)  Long term (current) use of oral hypoglycemic drugs  Long-term insulin use (HCC)  Pure hypercholesterolemia  Other orders -     empagliflozin (JARDIANCE) 25 MG TABS tablet; Take 1 tablet (25 mg total) by mouth daily before breakfast.    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 HF On palliative care, goal A1C 8% C-peptide +++ Lab Results  Component Value Date   GFR 77.10 10/30/2014   Hba1c goal less than 7, current Hba1c is  Lab Results  Component Value Date   HGBA1C 7.8 (A) 04/23/2023   Will recommend the following: Lantus 18 unit qam  Increase to Jardiance 25 mg every day (rash resolved) Linagliptin 5 mg every day Nateglinide 120 mg  tidac before each meal  Discussed about diet and activity at length On DexCom  No known contraindications/side effects to any of above medications Not interested in short acting insulin/diet adjustment   -Last LD and Tg are as follows: Lab Results  Component Value Date   LDLCALC 44 11/05/2018    Lab Results  Component Value Date   TRIG 90 11/05/2018   -On atorvastatin 80 mg QD -Follow low fat diet and exercise   -Blood pressure goal <140/90 - Microalbumin/creatinine goal is < 30 -Last MA/Cr is as follows: Lab Results  Component Value Date   MICROALBUR 0.6 12/25/2022   -not on ACE/ARB  -diet changes including salt restriction -limit eating outside -counseled BP targets per standards of diabetes care -uncontrolled blood pressure can lead to retinopathy, nephropathy and cardiovascular and  atherosclerotic heart disease  Reviewed and counseled on: -A1C target -Blood sugar targets -Complications of uncontrolled diabetes  -Checking blood sugar before meals and bedtime and bring log next visit -All medications with mechanism of action and side effects -Hypoglycemia management: rule of 15's, Glucagon Emergency Kit and medical alert ID -low-carb low-fat plate-method diet -At least 20 minutes of physical activity per day -Annual dilated retinal eye exam and foot exam -compliance and follow up needs -follow up as scheduled or earlier if problem gets worse  Call if blood sugar is less than 70 or consistently above 250    Take a 15 gm snack of carbohydrate at bedtime before you go to sleep if your blood sugar is less than 100.    If you are going to fast after midnight for a test or procedure, ask your physician for instructions on how to reduce/decrease your insulin dose.    Call if blood sugar is less than 70 or consistently above 250  -Treating a low sugar by rule of 15  (15 gms of sugar every 15 min until sugar is more than 70) If you feel your sugar is low, test your sugar to be sure If your sugar is low (less than 70), then take 15 grams of a fast acting Carbohydrate (3-4 glucose tablets or glucose gel or 4 ounces of juice or regular soda) Recheck your sugar 15 min after treating low to make sure it is more than 70 If sugar is still less than 70, treat again with 15 grams of carbohydrate  Don't drive the hour of hypoglycemia  If unconscious/unable to eat or drink by mouth, use glucagon injection or nasal spray baqsimi and call 911. Can repeat again in 15 min if still unconscious.  Return in about 3 months (around 07/24/2023).   I have reviewed current medications, nurse's notes, allergies, vital signs, past medical and surgical history, family medical history, and social history for this encounter. Counseled patient on symptoms, examination findings, lab findings,  imaging results, treatment decisions and monitoring and prognosis. The patient understood the recommendations and agrees with the treatment plan. All questions regarding treatment plan were fully answered.  Tony [place], MD  04/23/23    History of Present Illness [name] is a 89 y.o. year old male who presents for follow up of Type II diabetes mellitus.  [name] was first diagnosed >20 years ago. Diabetes education +  Home diabetes regimen: Lantus 15 unit qam  Jardiance 10 mg every day  Januvia 100 mg every day Nateglinide 60 mg 2 pills before break fast and 60 mg before dinner   COMPLICATIONS +  MI/Stroke -  retinopathy -  neuropathy -  nephropathy  BLOOD SUGAR DATA  CGM interpretation: At today's visit, we reviewed her CGM downloads. The full report is scanned in the media. Reviewing the CGM trends, BG are elevated all day, except improved overnight.    Physical Exam  BP 110/80   Pulse 89   Ht 6' (1.829 m)   Wt 186 lb (84.4 kg)   SpO2 99%   BMI 25.23 kg/m    Constitutional: well developed, well nourished Head: normocephalic, atraumatic Eyes: sclera anicteric, no redness Neck: supple Lungs: normal respiratory effort Neurology: alert and oriented Skin: dry, no appreciable rashes Musculoskeletal: no appreciable defects Psychiatric: normal mood and affect Diabetic Foot Exam - Simple   No data filed      Current Medications Patient's Medications  New Prescriptions   EMPAGLIFLOZIN (JARDIANCE) 25 MG TABS TABLET    Take 1 tablet (25 mg total) by mouth daily before breakfast.  Previous Medications   ATORVASTATIN (LIPITOR) 80 MG TABLET    TAKE ONE TABLET BY MOUTH EVERY DAY FOR CHOLESTEROL   BLOOD GLUCOSE MONITORING SUPPL DEVI    Use to check blood sugar three times a day. May substitute to any manufacturer covered by patient's insurance. Dx:E11.65   CETIRIZINE (ZYRTEC) 10 MG TABLET    Take 10 mg by mouth daily.   CONTINUOUS GLUCOSE SENSOR  (DEXCOM G7 SENSOR) MISC    1 Device by Does not apply route continuous.   CONTINUOUS GLUCOSE SENSOR (DEXCOM G7 SENSOR) MISC    1 Device by Does not apply route continuous. 1 sensor every 10 days as prescribed   DIGOXIN (LANOXIN) 0.125 MG TABLET    Take 1 tablet (0.125 mg total) by mouth daily.   ELIQUIS 5 MG TABS TABLET    TAKE ONE TABLET BY MOUTH TWICE DAILY   EPIPEN 2-PAK 0.3 MG/0.3ML SOAJ INJECTION    Inject 0.3 mg into the muscle daily as needed for anaphylaxis.    ERENUMAB-AOOE (AIMOVIG) 140 MG/ML SOAJ    Inject 140 mg into the skin every 28 (twenty-eight) days.   FLUTICASONE (FLONASE) 50 MCG/ACT NASAL SPRAY    Place 1 spray into the nose daily as needed for rhinitis or allergies.   GLUCOSE BLOOD (BLOOD GLUCOSE TEST STRIPS) STRP    1 each by In Vitro route in the morning, at noon, and at bedtime. May substitute to any manufacturer  covered by patient's insurance. Dx code E11.65   IBUPROFEN (ADVIL) 200 MG TABLET    Take 400 mg by mouth every 8 (eight) hours as needed (headaches/migraine).   INSULIN GLARGINE (LANTUS SOLOSTAR) 100 UNIT/ML SOLOSTAR PEN    Start Lantus 10 units once every morning. Increase by 1 units a every day until fasting blood sugar is less than 150. Stay on that dose.   INSULIN PEN NEEDLE 32G X 4 MM MISC    1 Device by Does not apply route daily.   LINAGLIPTIN (TRADJENTA) 5 MG TABS TABLET    Take 1 tablet (5 mg total) by mouth daily. Take 1 tab daily before breakfast   MAGNESIUM 250 MG TABS    Take 250 mg by mouth in the morning.   METOPROLOL SUCCINATE (TOPROL-XL) 25 MG 24 HR TABLET    TAKE ONE TABLET BY MOUTH TWICE DAILY WITH OR immediately following A MEAL   NATEGLINIDE (STARLIX) 120 MG TABLET    Take 1 tablet (120 mg total) by mouth 3 (three) times daily with meals.   ONETOUCH VERIO TEST STRIP    SMARTSIG:Via Meter 1-2 Times Daily   POTASSIUM CHLORIDE (KLOR-CON) 10 MEQ TABLET    Take 1 tablet (10 mEq total) by mouth daily.   SITAGLIPTIN (JANUVIA) 100 MG TABLET    Take 100 mg  by mouth at bedtime.   TORSEMIDE (DEMADEX) 20 MG TABLET    Take 1 tablet (20 mg total) by mouth daily.  Modified Medications   No medications on file  Discontinued Medications   EMPAGLIFLOZIN (JARDIANCE) 10 MG TABS TABLET    Take 10 mg by mouth in the morning.    Allergies Allergies  Allergen Reactions   Bee Venom Anaphylaxis   Latex Other (See Comments) and Rash    REDNESS AT REACTION SITE.   Metformin Diarrhea   Rivaroxaban Other (See Comments)    Headaches, blurred vision  Other Reaction(s): Dizziness   Sotalol Other (See Comments)    "BLUE TOES"- cut his circulation off   Levofloxacin     long QT syndrome, so avoid   Warfarin Sodium Other (See Comments)    migraine headaches/vision impariment    Past Medical History Past Medical History:  Diagnosis Date   Acute on chronic combined systolic (congestive) and diastolic (congestive) heart failure (HCC)    AICD (automatic cardioverter/defibrillator) present    [employer]n PPM/ICD   Carotid stenosis, right    s/p endarterectomy 11/07/18   Chronic combined systolic (congestive) and diastolic (congestive) heart failure (HCC)    Coronary artery disease    [facility]   Diabetes mellitus    Dysrhythmia    Endocarditis, valve unspecified    Mitral   Headache    hx migraines 6-7 x mo   History of migraine headaches    History of seasonal allergies    ICD (implantable cardiac defibrillator) in place    Optic nerve hemorrhage, 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  We will monitor this closely and look for changes over the ensuing 6 months.  Repeat visit here in 6 months   PAF (paroxysmal atrial fibrillation) (HCC)    NO LAA occlusion at the time of surgery  [name] 2018   Pleural effusion    PVC's (premature ventricular contractions)    Ventricular tachycardia, polymorphic (HCC)    status post ICD implantation     Past  Surgical History Past Surgical History:  Procedure Laterality Date   BIV UPGRADE N/A 03/22/2022   Procedure: BIV UPGRADE;  Surgeon: Duke Salvia, MD;  [facility]VASIVE CV LAB;  Service: Cardiovascular;  Laterality: N/A;   BRONCHIAL BIOPSY  07/22/2020   Procedure: BRONCHIAL BIOPSIES;  Surgeon: Leslye Peer, MD;  Location: [facility] ENDOSCOPY;  Service: Cardiopulmonary;;   BRONCHIAL BRUSHINGS  07/22/2020   Procedure: BRONCHIAL BRUSHINGS;  Surgeon: Leslye Peer, MD;  Location: [facility] ENDOSCOPY;  Service: Cardiopulmonary;;   BRONCHIAL WASHINGS  07/22/2020   Procedure: BRONCHIAL WASHINGS;  Surgeon: Leslye Peer, MD;  Location: [facility] ENDOSCOPY;  Service: Cardiopulmonary;;   CARDIAC CATHETERIZATION  04/03/2007   EF 40%   CARDIAC CATHETERIZATION  02/06/2006   EF 45%. ANTERIOR HYPOKINESIS   CARDIAC CATHETERIZATION  05/29/2000   EF 50%. MILD ANTERIOR HYPOKINESIS   CARDIAC CATHETERIZATION  10/25/1999   EF 50%. SEVERE MITRAL REGURGITATION   CARDIAC CATHETERIZATION  01/06/2003   EF 50%   CARDIAC DEFIBRILLATOR PLACEMENT     CARDIOVERSION N/A 02/07/2019   Procedure: CARDIOVERSION;  Surgeon: Pricilla Riffle, MD;  Location: [facility] ENDOSCOPY;  Service: Cardiovascular;  Laterality: N/A;   CARDIOVERSION N/A 11/24/2019   Procedure: CARDIOVERSION;  Surgeon: Wendall Stade, MD;  Location: [facility] ENDOSCOPY;  Service: Cardiovascular;  Laterality: N/A;   CARDIOVERSION N/A 02/15/2022   Procedure: CARDIOVERSION;  Surgeon: Jake Bathe, MD;  Location: [facility] ENDOSCOPY;  Service: Cardiovascular;  Laterality: N/A;   CARDIOVERSION N/A 03/03/2022   Procedure: CARDIOVERSION;  Surgeon: Parke Poisson, MD;  Location: [facility] ENDOSCOPY;  Service: Cardiovascular;  Laterality: N/A;   CATARACT EXTRACTION W/ INTRAOCULAR LENS  IMPLANT, BILATERAL     CHOLECYSTECTOMY N/A 01/08/2019   Procedure: LAPAROSCOPIC CHOLECYSTECTOMY WITH INTRAOPERATIVE CHOLANGIOGRAM;  Surgeon: Manus Rudd, MD;  Location: [facility] OR;  Service: General;  Laterality: N/A;    CORONARY ARTERY BYPASS GRAFT  2001   2 VESSEL CABG AND MITRAL VALVE REPAIR. HE HAD A LIMA GRAFT TO THE LAD AND RADIAL ARTERY  GRAFT TO THE OBTUSE MARGINAL  OF LEFT CIRCUMFLEX   CORONARY PRESSURE/FFR STUDY N/A 01/05/2017   Procedure: INTRAVASCULAR PRESSURE WIRE/FFR STUDY;  Surgeon: [name], MD;  Location: [facility] INVASIVE CV LAB;  Service: Cardiovascular;  Laterality: N/A;   ENDARTERECTOMY Right 11/07/2018   Procedure: ENDARTERECTOMY CAROTID RIGHT;  Surgeon: Maeola Harman, MD;  Location: [facility] OR;  Service: Vascular;  Laterality: Right;   EP IMPLANTABLE DEVICE N/A 11/06/2014   Procedure: ICD Generator Changeout;  Surgeon: Duke Salvia, MD;  Location: [facility] INVASIVE CV LAB;  Service: Cardiovascular;  Laterality: N/A;   ERCP N/A 01/10/2019   Procedure: ENDOSCOPIC RETROGRADE CHOLANGIOPANCREATOGRAPHY (ERCP);  Surgeon: Vida Rigger, MD;  Location: [facility] ENDOSCOPY;  Service: Endoscopy;  Laterality: N/A;   FOREIGN BODY REMOVAL Right 06/21/2017   Procedure: FOREIGN BODY REMOVAL ADULT RIGHT RING FINGER;  Surgeon: Betha Loa, MD;  Location: [facility] OR;  Service: Orthopedics;  Laterality: Right;   HEMORRHOID SURGERY     HEMORROIDECTOMY     KNEE ARTHROSCOPY     RIGHT KNEE   LAPAROSCOPIC APPENDECTOMY N/A 01/08/2019   Procedure: APPENDECTOMY LAPAROSCOPIC;  Surgeon: Manus Rudd, MD;  Location: [facility] OR;  Service: General;  Laterality: N/A;   LEFT HEART CATHETERIZATION WITH CORONARY ANGIOGRAM N/A 07/04/2012   Procedure: LEFT HEART CATHETERIZATION WITH CORONARY ANGIOGRAM;  Surgeon: [name], MD;  Location: [facility] CATH LAB;  Service: Cardiovascular;  Laterality: N/A;   MITRAL VALVE REPLACEMENT     No LAA occlusion at the time of  surgery  Tamela Oddi report 2018   REMOVAL OF STONES  01/10/2019   Procedure: REMOVAL OF STONES;  Surgeon: Vida Rigger, MD;  Location: [facility] ENDOSCOPY;  Service: Endoscopy;;   RIGHT/LEFT HEART CATH AND CORONARY/GRAFT ANGIOGRAPHY N/A 01/05/2017   Procedure: RIGHT/LEFT HEART CATH AND  CORONARY/GRAFT ANGIOGRAPHY;  Surgeon: [name], MD;  Location: [facility] INVASIVE CV LAB;  Service: Cardiovascular;  Laterality: N/A;   SPHINCTEROTOMY  01/10/2019   Procedure: SPHINCTEROTOMY;  Surgeon: Vida Rigger, MD;  Location: [facility] ENDOSCOPY;  Service: Endoscopy;;   TRANSTHORACIC ECHOCARDIOGRAM  04/02/2007   EF 35%   VIDEO BRONCHOSCOPY Right 07/22/2020   Procedure: VIDEO BRONCHOSCOPY WITH FLUORO, BAL AND BIOPSY;  Surgeon: Leslye Peer, MD;  Location: [facility] ENDOSCOPY;  Service: Cardiopulmonary;  Laterality: Right;  CONSCIOUS SEDATION OR MAC    Family History family history includes Cancer in his sister and another family member; Diabetes in an other family member; Emphysema in his father; Heart disease in his father, paternal grandfather, and paternal grandmother; Stroke in his mother.  Social History Social History   Socioeconomic History   Marital status: Married    Spouse name: Elease Hashimoto   Number of children: 2   Years of education: Not on file   Highest education level: Associate degree: academic program  Occupational History   Occupation: retired  Tobacco Use   Smoking status: Former    Current packs/day: 0.00    Average packs/day: 2.0 packs/day for 16.0 years (32.1 ttl pk-yrs)    Types: Cigarettes    Start date: 02/23/1956    Quit date: 01/31/1971    Years since quitting: 52.2   Smokeless tobacco: Never  Vaping Use   Vaping status: Never Used  Substance and Sexual Activity   Alcohol use: Yes    Alcohol/week: 0.0 standard drinks of alcohol    Comment: 1-2 a week   Drug use: No   Sexual activity: Yes  Other Topics Concern   Not on file  Social History Narrative   Patient is right-handed. He lives with his wife ina 2 story home. He drinks one cup of coffee and a diet coke a day.    Social Drivers of Corporate investment banker Strain: Not on file  Food Insecurity: Not on file  Transportation Needs: Not on file  Physical Activity: Not on file  Stress: Not on file   Social Connections: Not on file  Intimate Partner Violence: Not on file    Lab Results  Component Value Date   HGBA1C 7.8 (A) 04/23/2023   HGBA1C 9.4 (A) 01/26/2023   HGBA1C 9.1 (A) 12/25/2022   Lab Results  Component Value Date   CHOL 110 11/05/2018   Lab Results  Component Value Date   HDL 48 11/05/2018   Lab Results  Component Value Date   LDLCALC 44 11/05/2018   Lab Results  Component Value Date   TRIG 90 11/05/2018   Lab Results  Component Value Date   CHOLHDL 2.3 11/05/2018   Lab Results  Component Value Date   CREATININE 1.17 12/25/2022   Lab Results  Component Value Date   GFR 77.10 10/30/2014   Lab Results  Component Value Date   MICROALBUR 0.6 12/25/2022      Component Value Date/Time   NA 133 (L) 12/25/2022 1431   NA 134 12/05/2022 0852   K 4.4 12/25/2022 1431   CL 94 (L) 12/25/2022 1431   CO2 29 12/25/2022 1431   GLUCOSE 380 (H) 12/25/2022 1431   BUN 27 (  H) 12/25/2022 1431   BUN 25 12/05/2022 0852   CREATININE 1.17 12/25/2022 1431   CALCIUM 9.3 12/25/2022 1431   PROT 8.2 (H) 12/25/2022 1431   PROT 7.4 03/14/2022 1627   ALBUMIN 4.5 03/14/2022 1627   AST 29 12/25/2022 1431   ALT 28 12/25/2022 1431   ALKPHOS 183 (H) 03/14/2022 1627   BILITOT 1.7 (H) 12/25/2022 1431   BILITOT 3.0 (H) 03/14/2022 1627   GFRNONAA >60 07/14/2020 1124   GFRAA 74 09/24/2019 1139      Latest Ref Rng & Units 12/25/2022    2:31 PM 12/05/2022    8:52 AM 11/10/2022   11:30 AM  BMP  Glucose 65 - 99 mg/dL 161  096  045   BUN 7 - 25 mg/dL 27  25  21    Creatinine 0.70 - 1.22 mg/dL 4.09  8.11  9.14   BUN/Creat Ratio 6 - 22 (calc) 23  22  20    Sodium 135 - 146 mmol/L 133  134  133   Potassium 3.5 - 5.3 mmol/L 4.4  5.3  4.7   Chloride 98 - 110 mmol/L 94  93  98   CO2 20 - 32 mmol/L 29  27  24    Calcium 8.6 - 10.3 mg/dL 9.3  9.4  9.0        Component Value Date/Time   WBC 6.8 11/10/2022 1130   WBC 6.6 07/14/2020 1124   RBC 4.43 11/10/2022 1130   RBC 4.30  07/14/2020 1124   HGB 15.3 11/10/2022 1130   HCT 47.5 11/10/2022 1130   PLT 97 (LL) 11/10/2022 1130   MCV 107 (H) 11/10/2022 1130   MCH 34.5 (H) 11/10/2022 1130   MCH 32.8 07/14/2020 1124   MCHC 32.2 11/10/2022 1130   MCHC 32.4 07/14/2020 1124   RDW 12.9 11/10/2022 1130   LYMPHSABS 0.6 (L) 07/14/2020 1124   LYMPHSABS 0.9 12/27/2016 1523   MONOABS 0.6 07/14/2020 1124   EOSABS 0.3 07/14/2020 1124   EOSABS 0.2 12/27/2016 1523   BASOSABS 0.0 07/14/2020 1124   BASOSABS 0.0 12/27/2016 1523     Parts of this note may have been dictated using voice recognition software. There may be variances in spelling and vocabulary which are unintentional. Not all errors are proofread. Please notify the Thereasa Parkin if any discrepancies are noted or if the meaning of any statement is not clear.

## 2023-04-24 ENCOUNTER — Ambulatory Visit: Payer: PRIVATE HEALTH INSURANCE | Attending: Physician Assistant | Admitting: Physician Assistant

## 2023-04-24 ENCOUNTER — Encounter: Payer: Self-pay | Admitting: Physician Assistant

## 2023-04-24 VITALS — BP 100/72 | HR 78 | Ht 73.0 in | Wt 184.6 lb

## 2023-04-24 DIAGNOSIS — I059 Rheumatic mitral valve disease, unspecified: Secondary | ICD-10-CM | POA: Insufficient documentation

## 2023-04-24 DIAGNOSIS — I519 Heart disease, unspecified: Secondary | ICD-10-CM | POA: Diagnosis not present

## 2023-04-24 DIAGNOSIS — I4729 Other ventricular tachycardia: Secondary | ICD-10-CM | POA: Diagnosis not present

## 2023-04-24 DIAGNOSIS — I7143 Infrarenal abdominal aortic aneurysm, without rupture: Secondary | ICD-10-CM | POA: Diagnosis not present

## 2023-04-24 DIAGNOSIS — I509 Heart failure, unspecified: Secondary | ICD-10-CM | POA: Diagnosis not present

## 2023-04-24 DIAGNOSIS — I6523 Occlusion and stenosis of bilateral carotid arteries: Secondary | ICD-10-CM | POA: Insufficient documentation

## 2023-04-24 DIAGNOSIS — I5020 Unspecified systolic (congestive) heart failure: Secondary | ICD-10-CM | POA: Insufficient documentation

## 2023-04-24 DIAGNOSIS — E785 Hyperlipidemia, unspecified: Secondary | ICD-10-CM | POA: Diagnosis not present

## 2023-04-24 DIAGNOSIS — Z950 Presence of cardiac pacemaker: Secondary | ICD-10-CM | POA: Diagnosis not present

## 2023-04-24 DIAGNOSIS — I2510 Atherosclerotic heart disease of native coronary artery without angina pectoris: Secondary | ICD-10-CM | POA: Diagnosis not present

## 2023-04-24 DIAGNOSIS — I4819 Other persistent atrial fibrillation: Secondary | ICD-10-CM | POA: Diagnosis not present

## 2023-04-24 DIAGNOSIS — E7800 Pure hypercholesterolemia, unspecified: Secondary | ICD-10-CM | POA: Diagnosis not present

## 2023-04-24 DIAGNOSIS — H3530 Unspecified macular degeneration: Secondary | ICD-10-CM | POA: Diagnosis not present

## 2023-04-24 DIAGNOSIS — J449 Chronic obstructive pulmonary disease, unspecified: Secondary | ICD-10-CM | POA: Diagnosis not present

## 2023-04-24 NOTE — Assessment & Plan Note (Signed)
 S/p CABG.  Cardiac catheterization in December 2018 with radial graft to OM-2 and LIMA to LAD patent.  He is not experiencing angina symptoms. - Continue atorvastatin 80 mg daily - Continue metoprolol succinate 25 mg daily

## 2023-04-24 NOTE — Assessment & Plan Note (Signed)
 EF 20-25%.  NYHA class IIb. He is on palliative care, monitored by a palliative care nurse.  Overall, he is doing well.  His volume status is stable on current medical therapy. - Continue torsemide 20 mg daily - Continue K+ 10 mEq daily - Continue empagliflozin 25 mg daily - Continue digoxin 0.125 mg daily - Monitor weight daily and report if weight increases by 3 pounds or more in a day - Order BMET to check kidney function and potassium levels - Schedule follow-up in 6 months unless symptoms worsen

## 2023-04-24 NOTE — Assessment & Plan Note (Signed)
 S/p ICD with change to CRT-P in February 2024.  He reports dizziness and palpitations. - Arrange for earlier device interrogation - Follow up with EP as planned

## 2023-04-24 NOTE — Assessment & Plan Note (Signed)
 Status post right CEA.  Carotid ultrasound in 2021 with bilateral ICA stenosis 1-39%.

## 2023-04-24 NOTE — Assessment & Plan Note (Signed)
 LDL in August 2024 optimal at 63.  Continue Lipitor 80 mg daily.

## 2023-04-24 NOTE — Progress Notes (Signed)
 Cardiology Office Note:    Date:  04/24/2023  ID:  [name], DOB 12-Aug-1931, MRN [id number] PCP: Tony Ferretti, DO  [facility] Providers Cardiologist:  Tony Bollman, MD Electrophysiologist:  Tony Manges, MD       Patient Profile:      Coronary artery disease  S/p CABG Cath 12/18: oLAD 50, pLAD 50, oD1 50; oLCx 60, OM2 95; RCA irregs; R Radial-OM2 and L-LAD patent >> Med rx Mitral valve disease S/p MV repair at time of CABG Persistent atrial fibrillation Dofetilide Rx, Apixaban Rx  Digoxin Rx  (HFrEF) heart failure with reduced ejection fraction  TTE 10/2018: EF 35-40 TTE 05/06/20: EF 35-40, global HK, mild LVH, mildly reduced RVSF, RVSP 45.5 (mod elevated pulmonary artery pressure), severe BAE, trivial MR, mild MS (mean 3 mmHg), mild to mod TR, mild AV calcification, dilated Ao root (40 mm), mild dilation of ascending aorta (37 mm)  TTE 03/07/22: EF 20-25, GLS -4.3, severely reduced RVSF, mod pulm hypertension, RVSF 45.2, severe BAE, mild MR, mild MS, mean MV 4 mmHg, severe TR, RAP 15 TTE 08/14/22: EF 20-25, global HK, mod LVH, severely reduced RVSF, mild pul hypertension, severe BAE, mild MR, mild MS, severe TR, trivial AI, mod PI, RAP 15 Pulmonary hypertension   Echocardiogram 4/22: RVSP 45.5  Ventricular tachycardia  S/p ICD  S/p ? to CRT-P in 03/2022 Carotid artery disease S/p R CEA  [name] 08/29/19: Bilat ICA 1-39 Abdominal aortic aneurysm  CT 07/2022: 6.7 cm (followed by vasc surg)  Hypertension  Diabetes mellitus  Chronic kidney disease  Chronic Obstructive Pulmonary Disease  Hx of CVA           Discussed the use of AI scribe software for clinical note transcription with the patient, who gave verbal consent to proceed.  History of Present Illness [name] is a 89 y.o. male who returns for follow-up of CHF, CAD, valvular heart disease, A-fib.  He was seen by Dr. Graciela Husbands in October and was massively volume overloaded.  His furosemide was switched to  torsemide.  He was also placed on digoxin for better rate control to allow for CRT pacing.  He was last seen in clinic by Dr. Excell Seltzer 12/01/2022.  His dose of torsemide was reduced to 20 mg daily at that time and he was referred to palliative care.  He is accompanied by his daughter, [name]. He has not noticed significant changes in his weight at home. His breathing is described as 'okay', with no shortness of breath during normal activities such as walking around the house or climbing stairs. No chest discomfort, pressure, heaviness, or tightness, and he can breathe comfortably while lying flat.  He notes more frequent episodes of palpitations. He is on palliative care. His blood pressure at home is consistently low, ranging from 100 to 110. No feelings of passing out. He weighs himself daily.  Review of Systems  Gastrointestinal:  Negative for hematochezia and melena.  Genitourinary:  Negative for hematuria.  -See HPI    Studies Reviewed:       Results Labs-chart reviewed 11/10/2022: NT-proBNP: 6091, Hgb 15.3 12/05/2022: Dig level 0.8 12/25/2022: Na 133,K 4.4, creatinine 1.17, ALT 28   Risk Assessment/Calculations:    CHA2DS2-VASc Score = 7   This indicates a 11.2% annual risk of stroke. The patient's score is based upon: CHF History: 1 HTN History: 0 Diabetes History: 1 Stroke History: 2 Vascular Disease History: 1 Age Score: 2 Gender Score: 0  Physical Exam:   VS:  BP 100/72   Pulse 78   Ht 6\' 1"  (1.854 m)   Wt 184 lb 9.6 oz (83.7 kg)   SpO2 98%   BMI 24.36 kg/m    Wt Readings from Last 3 Encounters:  04/24/23 184 lb 9.6 oz (83.7 kg)  04/23/23 186 lb (84.4 kg)  01/26/23 184 lb 12.8 oz (83.8 kg)    Constitutional:      Appearance: Healthy appearance. Not in distress.  Neck:     Vascular: JVD normal.  Pulmonary:     Breath sounds: Normal breath sounds. No wheezing. No rales.  Cardiovascular:     Normal rate. Regular rhythm.     Murmurs: There is no  murmur.  Edema:    Peripheral edema absent.  Abdominal:     Palpations: Abdomen is soft.       Assessment and Plan:   Assessment & Plan HFrEF (heart failure with reduced ejection fraction) (HCC) EF 20-25%.  NYHA class IIb. He is on palliative care, monitored by a palliative care nurse.  Overall, he is doing well.  His volume status is stable on current medical therapy. - Continue torsemide 20 mg daily - Continue K+ 10 mEq daily - Continue empagliflozin 25 mg daily - Continue digoxin 0.125 mg daily - Monitor weight daily and report if weight increases by 3 pounds or more in a day - Order BMET to check kidney function and potassium levels - Schedule follow-up in 6 months unless symptoms worsen Mitral valve disease Status post mitral valve repair at the time of CABG. Echocardiogram from July 2024 shows mild mitral regurgitation. No new symptoms reported. - Continue SBE prophylaxis Coronary artery disease involving native coronary artery of native heart without angina pectoris S/p CABG.  Cardiac catheterization in December 2018 with radial graft to OM-2 and LIMA to LAD patent.  He is not experiencing angina symptoms. - Continue atorvastatin 80 mg daily - Continue metoprolol succinate 25 mg daily Persistent atrial fibrillation (HCC) He notes more frequent palpitations. An earlier device interrogation is planned. - Continue apixaban 5 mg twice daily - Continue digoxin 0.125 mg daily - Continue metoprolol succinate 25 mg daily - Arrange for earlier device interrogation Ventricular tachycardia, polymorphic (HCC) S/p ICD with change to CRT-P in February 2024.  He reports dizziness and palpitations. - Arrange for earlier device interrogation - Follow up with EP as planned Infrarenal abdominal aortic aneurysm (AAA) without rupture [facility]) He has opted to not pursue surgical repair. Bilateral carotid artery stenosis Status post right CEA.  Carotid ultrasound in 2021 with bilateral ICA  stenosis 1-39%. Pure hypercholesterolemia LDL in August 2024 optimal at 63.  Continue Lipitor 80 mg daily.       Dispo:  Return in about 6 months (around 10/25/2023) for Routine Follow Up, w/ Dr. Excell Seltzer.  Signed, Tereso Newcomer, PA-C

## 2023-04-24 NOTE — Patient Instructions (Signed)
 Medication Instructions:  Your physician recommends that you continue on your current medications as directed. Please refer to the Current Medication list given to you today.  *If you need a refill on your cardiac medications before your next appointment, please call your pharmacy*   Lab Work: TODAY:  BMET & CBC  If you have labs (blood work) drawn today and your tests are completely normal, you will receive your results only by: MyChart Message (if you have MyChart) OR A paper copy in the mail If you have any lab test that is abnormal or we need to change your treatment, we will call you to review the results.   Testing/Procedures: None ordered   Follow-Up: At [facility], you and your health needs are our priority.  As part of our continuing mission to provide you with exceptional heart care, we have created designated Provider Care Teams.  These Care Teams include your primary Cardiologist (physician) and Advanced Practice Providers (APPs -  Physician Assistants and Nurse Practitioners) who all work together to provide you with the care you need, when you need it.  We recommend signing up for the patient portal called "MyChart".  Sign up information is provided on this After Visit Summary.  MyChart is used to connect with patients for Virtual Visits (Telemedicine).  Patients are able to view lab/test results, encounter notes, upcoming appointments, etc.  Non-urgent messages can be sent to your provider as well.   To learn more about what you can do with MyChart, go to [email or web address].    Your next appointment:   6 month(s)  Provider:   [name], MD  or Tereso Newcomer, PA-C         Other Instructions     1st Floor: - Lobby - Registration  - Pharmacy  - Lab - Cafe  2nd Floor: - PV Lab - Diagnostic Testing (echo, CT, nuclear med)  3rd Floor: - Vacant  4th Floor: - TCTS (cardiothoracic surgery) - AFib Clinic - Structural Heart Clinic -  Vascular Surgery  - Vascular Ultrasound  5th Floor: - [facility]iology (general and EP) - Clinical Pharmacy for coumadin, hypertension, lipid, weight-loss medications, and med management appointments    Valet parking services will be available as well.

## 2023-04-24 NOTE — Assessment & Plan Note (Signed)
 Status post mitral valve repair at the time of CABG. Echocardiogram from July 2024 shows mild mitral regurgitation. No new symptoms reported. - Continue SBE prophylaxis

## 2023-04-24 NOTE — Assessment & Plan Note (Signed)
 He has opted to not pursue surgical repair.

## 2023-04-24 NOTE — Assessment & Plan Note (Signed)
 He notes more frequent palpitations. An earlier device interrogation is planned. - Continue apixaban 5 mg twice daily - Continue digoxin 0.125 mg daily - Continue metoprolol succinate 25 mg daily - Arrange for earlier device interrogation

## 2023-04-25 DIAGNOSIS — Z950 Presence of cardiac pacemaker: Secondary | ICD-10-CM | POA: Diagnosis not present

## 2023-04-25 DIAGNOSIS — H3530 Unspecified macular degeneration: Secondary | ICD-10-CM | POA: Diagnosis not present

## 2023-04-25 DIAGNOSIS — E785 Hyperlipidemia, unspecified: Secondary | ICD-10-CM | POA: Diagnosis not present

## 2023-04-25 DIAGNOSIS — J449 Chronic obstructive pulmonary disease, unspecified: Secondary | ICD-10-CM | POA: Diagnosis not present

## 2023-04-25 DIAGNOSIS — I519 Heart disease, unspecified: Secondary | ICD-10-CM | POA: Diagnosis not present

## 2023-04-25 DIAGNOSIS — I509 Heart failure, unspecified: Secondary | ICD-10-CM | POA: Diagnosis not present

## 2023-04-25 LAB — BASIC METABOLIC PANEL
BUN/Creatinine Ratio: 17 (ref 10–24)
BUN: 18 mg/dL (ref 8–27)
CO2: 28 mmol/L (ref 20–29)
Calcium: 9.5 mg/dL (ref 8.6–10.2)
Chloride: 93 mmol/L — ABNORMAL LOW (ref 96–106)
Creatinine, Ser: 1.04 mg/dL (ref 0.76–1.27)
Glucose: 280 mg/dL — ABNORMAL HIGH (ref 70–99)
Potassium: 4.8 mmol/L (ref 3.5–5.2)
Sodium: 136 mmol/L (ref 134–144)
eGFR: 69 mL/min/{1.73_m2} (ref >59)

## 2023-04-25 LAB — CBC
Hematocrit: 48.1 % (ref 37.5–51.0)
Hemoglobin: 16.5 g/dL (ref 13.0–17.7)
MCH: 35.5 pg — ABNORMAL HIGH (ref 26.6–33.0)
MCHC: 34.3 g/dL (ref 31.5–35.7)
MCV: 103 fL — ABNORMAL HIGH (ref 79–97)
Platelets: 136 10*3/uL — ABNORMAL LOW (ref 150–450)
RBC: 4.65 x10E6/uL (ref 4.14–5.80)
RDW: 12.6 % (ref 11.6–15.4)
WBC: 7 10*3/uL (ref 3.4–10.8)

## 2023-04-25 NOTE — Addendum Note (Signed)
 Addended by: Elease Etienne A on: 04/25/2023 10:47 AM   Modules accepted: Orders

## 2023-04-25 NOTE — Progress Notes (Signed)
 Remote pacemaker transmission.

## 2023-04-27 ENCOUNTER — Encounter: Payer: Self-pay | Admitting: Internal Medicine

## 2023-04-27 ENCOUNTER — Encounter: Payer: Self-pay | Admitting: "Endocrinology

## 2023-04-27 NOTE — Progress Notes (Signed)
 [name]

## 2023-04-27 NOTE — Telephone Encounter (Signed)
 Outreach made to Pt.  Per Pt he has had a few times over the last month when he thought he would pass out.  One time he got up to go outside and became lightheaded and had to sit down.  He states he thought he would pass out but did not.  Other episodes occur when Pt is seated.  He becomes lightheaded and feels like he may pass out.    Transmission received and reviewed.  Pt is effectively BIV roughly 92%.  PVC burden over last month is 0.  Device function WNL.  Pt has had a few ventricular sensing episodes over the last month.  Unsure of the clinic significance of these as Pt is unable to give dates/times of his episodes.  Pt's blood pressure also runs low.  Last OV his blood pressure was 100/72 which Pt states is normal for him.  Requested Pt document date/time for any further episodes.  Also requested Pt attempt to check BP during these episodes and document.  Advised to contact office after next episode to try to correlate any arrhythmia which may be contributing.  Pt in agreement with plan.  Will forward to [facility] PA.  So documentation for transmission results received.

## 2023-05-01 ENCOUNTER — Encounter: Payer: Self-pay | Admitting: Internal Medicine

## 2023-05-01 DIAGNOSIS — Z8673 Personal history of transient ischemic attack (TIA), and cerebral infarction without residual deficits: Secondary | ICD-10-CM | POA: Diagnosis not present

## 2023-05-01 DIAGNOSIS — E118 Type 2 diabetes mellitus with unspecified complications: Secondary | ICD-10-CM | POA: Diagnosis not present

## 2023-05-01 DIAGNOSIS — E785 Hyperlipidemia, unspecified: Secondary | ICD-10-CM | POA: Diagnosis not present

## 2023-05-01 DIAGNOSIS — Z950 Presence of cardiac pacemaker: Secondary | ICD-10-CM | POA: Diagnosis not present

## 2023-05-01 DIAGNOSIS — I10 Essential (primary) hypertension: Secondary | ICD-10-CM | POA: Diagnosis not present

## 2023-05-01 DIAGNOSIS — I714 Abdominal aortic aneurysm, without rupture, unspecified: Secondary | ICD-10-CM | POA: Diagnosis not present

## 2023-05-01 DIAGNOSIS — Z7901 Long term (current) use of anticoagulants: Secondary | ICD-10-CM | POA: Diagnosis not present

## 2023-05-01 DIAGNOSIS — I519 Heart disease, unspecified: Secondary | ICD-10-CM | POA: Diagnosis not present

## 2023-05-01 DIAGNOSIS — H3530 Unspecified macular degeneration: Secondary | ICD-10-CM | POA: Diagnosis not present

## 2023-05-01 DIAGNOSIS — I480 Paroxysmal atrial fibrillation: Secondary | ICD-10-CM | POA: Diagnosis not present

## 2023-05-01 DIAGNOSIS — N189 Chronic kidney disease, unspecified: Secondary | ICD-10-CM | POA: Diagnosis not present

## 2023-05-01 DIAGNOSIS — I509 Heart failure, unspecified: Secondary | ICD-10-CM | POA: Diagnosis not present

## 2023-05-01 DIAGNOSIS — J449 Chronic obstructive pulmonary disease, unspecified: Secondary | ICD-10-CM | POA: Diagnosis not present

## 2023-05-01 NOTE — Telephone Encounter (Signed)
 Spoke with patient, informed that his transmission was reviewed and no events was recorded. Patient states his dizziness was just for a few minutes, heart rate and blood pressure normal for him when he checked it. Currently states he's feeling well, just wanted someone to review his device to see if it's still working as it should.

## 2023-05-02 DIAGNOSIS — I509 Heart failure, unspecified: Secondary | ICD-10-CM | POA: Diagnosis not present

## 2023-05-02 DIAGNOSIS — Z950 Presence of cardiac pacemaker: Secondary | ICD-10-CM | POA: Diagnosis not present

## 2023-05-02 DIAGNOSIS — H3530 Unspecified macular degeneration: Secondary | ICD-10-CM | POA: Diagnosis not present

## 2023-05-02 DIAGNOSIS — I519 Heart disease, unspecified: Secondary | ICD-10-CM | POA: Diagnosis not present

## 2023-05-02 DIAGNOSIS — E785 Hyperlipidemia, unspecified: Secondary | ICD-10-CM | POA: Diagnosis not present

## 2023-05-02 DIAGNOSIS — J449 Chronic obstructive pulmonary disease, unspecified: Secondary | ICD-10-CM | POA: Diagnosis not present

## 2023-05-09 DIAGNOSIS — I519 Heart disease, unspecified: Secondary | ICD-10-CM | POA: Diagnosis not present

## 2023-05-09 DIAGNOSIS — I509 Heart failure, unspecified: Secondary | ICD-10-CM | POA: Diagnosis not present

## 2023-05-09 DIAGNOSIS — J449 Chronic obstructive pulmonary disease, unspecified: Secondary | ICD-10-CM | POA: Diagnosis not present

## 2023-05-09 DIAGNOSIS — Z950 Presence of cardiac pacemaker: Secondary | ICD-10-CM | POA: Diagnosis not present

## 2023-05-09 DIAGNOSIS — E785 Hyperlipidemia, unspecified: Secondary | ICD-10-CM | POA: Diagnosis not present

## 2023-05-09 DIAGNOSIS — H3530 Unspecified macular degeneration: Secondary | ICD-10-CM | POA: Diagnosis not present

## 2023-05-10 DIAGNOSIS — I509 Heart failure, unspecified: Secondary | ICD-10-CM | POA: Diagnosis not present

## 2023-05-10 DIAGNOSIS — J449 Chronic obstructive pulmonary disease, unspecified: Secondary | ICD-10-CM | POA: Diagnosis not present

## 2023-05-10 DIAGNOSIS — Z950 Presence of cardiac pacemaker: Secondary | ICD-10-CM | POA: Diagnosis not present

## 2023-05-10 DIAGNOSIS — I519 Heart disease, unspecified: Secondary | ICD-10-CM | POA: Diagnosis not present

## 2023-05-10 DIAGNOSIS — H3530 Unspecified macular degeneration: Secondary | ICD-10-CM | POA: Diagnosis not present

## 2023-05-10 DIAGNOSIS — E785 Hyperlipidemia, unspecified: Secondary | ICD-10-CM | POA: Diagnosis not present

## 2023-05-16 DIAGNOSIS — Z950 Presence of cardiac pacemaker: Secondary | ICD-10-CM | POA: Diagnosis not present

## 2023-05-16 DIAGNOSIS — H3530 Unspecified macular degeneration: Secondary | ICD-10-CM | POA: Diagnosis not present

## 2023-05-16 DIAGNOSIS — I519 Heart disease, unspecified: Secondary | ICD-10-CM | POA: Diagnosis not present

## 2023-05-16 DIAGNOSIS — I509 Heart failure, unspecified: Secondary | ICD-10-CM | POA: Diagnosis not present

## 2023-05-16 DIAGNOSIS — E785 Hyperlipidemia, unspecified: Secondary | ICD-10-CM | POA: Diagnosis not present

## 2023-05-16 DIAGNOSIS — J449 Chronic obstructive pulmonary disease, unspecified: Secondary | ICD-10-CM | POA: Diagnosis not present

## 2023-05-23 DIAGNOSIS — I519 Heart disease, unspecified: Secondary | ICD-10-CM | POA: Diagnosis not present

## 2023-05-23 DIAGNOSIS — I509 Heart failure, unspecified: Secondary | ICD-10-CM | POA: Diagnosis not present

## 2023-05-23 DIAGNOSIS — E785 Hyperlipidemia, unspecified: Secondary | ICD-10-CM | POA: Diagnosis not present

## 2023-05-23 DIAGNOSIS — Z950 Presence of cardiac pacemaker: Secondary | ICD-10-CM | POA: Diagnosis not present

## 2023-05-23 DIAGNOSIS — H3530 Unspecified macular degeneration: Secondary | ICD-10-CM | POA: Diagnosis not present

## 2023-05-23 DIAGNOSIS — J449 Chronic obstructive pulmonary disease, unspecified: Secondary | ICD-10-CM | POA: Diagnosis not present

## 2023-05-30 DIAGNOSIS — H3530 Unspecified macular degeneration: Secondary | ICD-10-CM | POA: Diagnosis not present

## 2023-05-30 DIAGNOSIS — I509 Heart failure, unspecified: Secondary | ICD-10-CM | POA: Diagnosis not present

## 2023-05-30 DIAGNOSIS — J449 Chronic obstructive pulmonary disease, unspecified: Secondary | ICD-10-CM | POA: Diagnosis not present

## 2023-05-30 DIAGNOSIS — E785 Hyperlipidemia, unspecified: Secondary | ICD-10-CM | POA: Diagnosis not present

## 2023-05-30 DIAGNOSIS — Z950 Presence of cardiac pacemaker: Secondary | ICD-10-CM | POA: Diagnosis not present

## 2023-05-30 DIAGNOSIS — I519 Heart disease, unspecified: Secondary | ICD-10-CM | POA: Diagnosis not present

## 2023-05-31 DIAGNOSIS — E118 Type 2 diabetes mellitus with unspecified complications: Secondary | ICD-10-CM | POA: Diagnosis not present

## 2023-05-31 DIAGNOSIS — E785 Hyperlipidemia, unspecified: Secondary | ICD-10-CM | POA: Diagnosis not present

## 2023-05-31 DIAGNOSIS — I480 Paroxysmal atrial fibrillation: Secondary | ICD-10-CM | POA: Diagnosis not present

## 2023-05-31 DIAGNOSIS — Z7901 Long term (current) use of anticoagulants: Secondary | ICD-10-CM | POA: Diagnosis not present

## 2023-05-31 DIAGNOSIS — H3530 Unspecified macular degeneration: Secondary | ICD-10-CM | POA: Diagnosis not present

## 2023-05-31 DIAGNOSIS — N189 Chronic kidney disease, unspecified: Secondary | ICD-10-CM | POA: Diagnosis not present

## 2023-05-31 DIAGNOSIS — J449 Chronic obstructive pulmonary disease, unspecified: Secondary | ICD-10-CM | POA: Diagnosis not present

## 2023-05-31 DIAGNOSIS — I509 Heart failure, unspecified: Secondary | ICD-10-CM | POA: Diagnosis not present

## 2023-05-31 DIAGNOSIS — Z950 Presence of cardiac pacemaker: Secondary | ICD-10-CM | POA: Diagnosis not present

## 2023-05-31 DIAGNOSIS — I519 Heart disease, unspecified: Secondary | ICD-10-CM | POA: Diagnosis not present

## 2023-05-31 DIAGNOSIS — Z8673 Personal history of transient ischemic attack (TIA), and cerebral infarction without residual deficits: Secondary | ICD-10-CM | POA: Diagnosis not present

## 2023-05-31 DIAGNOSIS — I10 Essential (primary) hypertension: Secondary | ICD-10-CM | POA: Diagnosis not present

## 2023-05-31 DIAGNOSIS — I714 Abdominal aortic aneurysm, without rupture, unspecified: Secondary | ICD-10-CM | POA: Diagnosis not present

## 2023-06-06 DIAGNOSIS — I519 Heart disease, unspecified: Secondary | ICD-10-CM | POA: Diagnosis not present

## 2023-06-06 DIAGNOSIS — H3530 Unspecified macular degeneration: Secondary | ICD-10-CM | POA: Diagnosis not present

## 2023-06-06 DIAGNOSIS — E785 Hyperlipidemia, unspecified: Secondary | ICD-10-CM | POA: Diagnosis not present

## 2023-06-06 DIAGNOSIS — J449 Chronic obstructive pulmonary disease, unspecified: Secondary | ICD-10-CM | POA: Diagnosis not present

## 2023-06-06 DIAGNOSIS — Z950 Presence of cardiac pacemaker: Secondary | ICD-10-CM | POA: Diagnosis not present

## 2023-06-06 DIAGNOSIS — I509 Heart failure, unspecified: Secondary | ICD-10-CM | POA: Diagnosis not present

## 2023-06-11 ENCOUNTER — Encounter: Admitting: Internal Medicine

## 2023-06-13 DIAGNOSIS — Z950 Presence of cardiac pacemaker: Secondary | ICD-10-CM | POA: Diagnosis not present

## 2023-06-13 DIAGNOSIS — I519 Heart disease, unspecified: Secondary | ICD-10-CM | POA: Diagnosis not present

## 2023-06-13 DIAGNOSIS — I509 Heart failure, unspecified: Secondary | ICD-10-CM | POA: Diagnosis not present

## 2023-06-13 DIAGNOSIS — E785 Hyperlipidemia, unspecified: Secondary | ICD-10-CM | POA: Diagnosis not present

## 2023-06-13 DIAGNOSIS — J449 Chronic obstructive pulmonary disease, unspecified: Secondary | ICD-10-CM | POA: Diagnosis not present

## 2023-06-13 DIAGNOSIS — H3530 Unspecified macular degeneration: Secondary | ICD-10-CM | POA: Diagnosis not present

## 2023-06-14 ENCOUNTER — Encounter: Payer: Self-pay | Admitting: "Endocrinology

## 2023-06-14 MED ORDER — LANTUS SOLOSTAR 100 UNIT/ML ~~LOC~~ SOPN: PEN_INJECTOR | SUBCUTANEOUS | 3 refills | Status: DC

## 2023-06-20 DIAGNOSIS — H3530 Unspecified macular degeneration: Secondary | ICD-10-CM | POA: Diagnosis not present

## 2023-06-20 DIAGNOSIS — I509 Heart failure, unspecified: Secondary | ICD-10-CM | POA: Diagnosis not present

## 2023-06-20 DIAGNOSIS — J449 Chronic obstructive pulmonary disease, unspecified: Secondary | ICD-10-CM | POA: Diagnosis not present

## 2023-06-20 DIAGNOSIS — Z950 Presence of cardiac pacemaker: Secondary | ICD-10-CM | POA: Diagnosis not present

## 2023-06-20 DIAGNOSIS — I519 Heart disease, unspecified: Secondary | ICD-10-CM | POA: Diagnosis not present

## 2023-06-20 DIAGNOSIS — E785 Hyperlipidemia, unspecified: Secondary | ICD-10-CM | POA: Diagnosis not present

## 2023-06-21 ENCOUNTER — Ambulatory Visit (INDEPENDENT_AMBULATORY_CARE_PROVIDER_SITE_OTHER)

## 2023-06-21 DIAGNOSIS — J449 Chronic obstructive pulmonary disease, unspecified: Secondary | ICD-10-CM | POA: Diagnosis not present

## 2023-06-21 DIAGNOSIS — I509 Heart failure, unspecified: Secondary | ICD-10-CM | POA: Diagnosis not present

## 2023-06-21 DIAGNOSIS — Z950 Presence of cardiac pacemaker: Secondary | ICD-10-CM | POA: Diagnosis not present

## 2023-06-21 DIAGNOSIS — I4729 Other ventricular tachycardia: Secondary | ICD-10-CM | POA: Diagnosis not present

## 2023-06-21 DIAGNOSIS — H3530 Unspecified macular degeneration: Secondary | ICD-10-CM | POA: Diagnosis not present

## 2023-06-21 DIAGNOSIS — E785 Hyperlipidemia, unspecified: Secondary | ICD-10-CM | POA: Diagnosis not present

## 2023-06-21 DIAGNOSIS — I519 Heart disease, unspecified: Secondary | ICD-10-CM | POA: Diagnosis not present

## 2023-06-21 LAB — CUP PACEART REMOTE DEVICE CHECK
Battery Remaining Longevity: 96 mo
Battery Voltage: 3 V
Brady Statistic RA Percent Paced: 0 %
Brady Statistic RV Percent Paced: 90.27 %
Date Time Interrogation Session: 20250521230959
Implantable Lead Connection Status: 753985
Implantable Lead Connection Status: 753985
Implantable Lead Connection Status: 753985
Implantable Lead Implant Date: 20040309
Implantable Lead Implant Date: 20040309
Implantable Lead Implant Date: 20240221
Implantable Lead Location: 753858
Implantable Lead Location: 753859
Implantable Lead Location: 753860
Implantable Lead Model: 158
Implantable Lead Model: 158
Implantable Lead Model: 5076
Implantable Lead Serial Number: 115061
Implantable Pulse Generator Implant Date: 20240221
Lead Channel Impedance Value: 1083 Ohm
Lead Channel Impedance Value: 323 Ohm
Lead Channel Impedance Value: 399 Ohm
Lead Channel Impedance Value: 456 Ohm
Lead Channel Impedance Value: 475 Ohm
Lead Channel Impedance Value: 513 Ohm
Lead Channel Impedance Value: 532 Ohm
Lead Channel Impedance Value: 551 Ohm
Lead Channel Impedance Value: 646 Ohm
Lead Channel Impedance Value: 703 Ohm
Lead Channel Impedance Value: 855 Ohm
Lead Channel Impedance Value: 893 Ohm
Lead Channel Impedance Value: 912 Ohm
Lead Channel Impedance Value: 988 Ohm
Lead Channel Pacing Threshold Amplitude: 0.75 V
Lead Channel Pacing Threshold Amplitude: 1.625 V
Lead Channel Pacing Threshold Pulse Width: 0.4 ms
Lead Channel Pacing Threshold Pulse Width: 1 ms
Lead Channel Sensing Intrinsic Amplitude: 1.25 mV
Lead Channel Sensing Intrinsic Amplitude: 1.25 mV
Lead Channel Sensing Intrinsic Amplitude: 8.125 mV
Lead Channel Sensing Intrinsic Amplitude: 8.125 mV
Lead Channel Setting Pacing Amplitude: 2.5 V
Lead Channel Setting Pacing Amplitude: 2.5 V
Lead Channel Setting Pacing Pulse Width: 0.4 ms
Lead Channel Setting Pacing Pulse Width: 1 ms
Lead Channel Setting Sensing Sensitivity: 0.9 mV
Zone Setting Status: 755011
Zone Setting Status: 755011

## 2023-06-27 DIAGNOSIS — I519 Heart disease, unspecified: Secondary | ICD-10-CM | POA: Diagnosis not present

## 2023-06-27 DIAGNOSIS — Z950 Presence of cardiac pacemaker: Secondary | ICD-10-CM | POA: Diagnosis not present

## 2023-06-27 DIAGNOSIS — I509 Heart failure, unspecified: Secondary | ICD-10-CM | POA: Diagnosis not present

## 2023-06-27 DIAGNOSIS — J449 Chronic obstructive pulmonary disease, unspecified: Secondary | ICD-10-CM | POA: Diagnosis not present

## 2023-06-27 DIAGNOSIS — H3530 Unspecified macular degeneration: Secondary | ICD-10-CM | POA: Diagnosis not present

## 2023-06-27 DIAGNOSIS — E785 Hyperlipidemia, unspecified: Secondary | ICD-10-CM | POA: Diagnosis not present

## 2023-07-01 DIAGNOSIS — I10 Essential (primary) hypertension: Secondary | ICD-10-CM | POA: Diagnosis not present

## 2023-07-01 DIAGNOSIS — H3530 Unspecified macular degeneration: Secondary | ICD-10-CM | POA: Diagnosis not present

## 2023-07-01 DIAGNOSIS — N189 Chronic kidney disease, unspecified: Secondary | ICD-10-CM | POA: Diagnosis not present

## 2023-07-01 DIAGNOSIS — I480 Paroxysmal atrial fibrillation: Secondary | ICD-10-CM | POA: Diagnosis not present

## 2023-07-01 DIAGNOSIS — Z950 Presence of cardiac pacemaker: Secondary | ICD-10-CM | POA: Diagnosis not present

## 2023-07-01 DIAGNOSIS — I714 Abdominal aortic aneurysm, without rupture, unspecified: Secondary | ICD-10-CM | POA: Diagnosis not present

## 2023-07-01 DIAGNOSIS — I509 Heart failure, unspecified: Secondary | ICD-10-CM | POA: Diagnosis not present

## 2023-07-01 DIAGNOSIS — E118 Type 2 diabetes mellitus with unspecified complications: Secondary | ICD-10-CM | POA: Diagnosis not present

## 2023-07-01 DIAGNOSIS — J449 Chronic obstructive pulmonary disease, unspecified: Secondary | ICD-10-CM | POA: Diagnosis not present

## 2023-07-01 DIAGNOSIS — Z8673 Personal history of transient ischemic attack (TIA), and cerebral infarction without residual deficits: Secondary | ICD-10-CM | POA: Diagnosis not present

## 2023-07-01 DIAGNOSIS — Z7901 Long term (current) use of anticoagulants: Secondary | ICD-10-CM | POA: Diagnosis not present

## 2023-07-01 DIAGNOSIS — E785 Hyperlipidemia, unspecified: Secondary | ICD-10-CM | POA: Diagnosis not present

## 2023-07-01 DIAGNOSIS — I519 Heart disease, unspecified: Secondary | ICD-10-CM | POA: Diagnosis not present

## 2023-07-03 ENCOUNTER — Ambulatory Visit: Payer: Self-pay | Admitting: Cardiovascular Disease

## 2023-07-04 DIAGNOSIS — I519 Heart disease, unspecified: Secondary | ICD-10-CM | POA: Diagnosis not present

## 2023-07-04 DIAGNOSIS — Z950 Presence of cardiac pacemaker: Secondary | ICD-10-CM | POA: Diagnosis not present

## 2023-07-04 DIAGNOSIS — E785 Hyperlipidemia, unspecified: Secondary | ICD-10-CM | POA: Diagnosis not present

## 2023-07-04 DIAGNOSIS — J449 Chronic obstructive pulmonary disease, unspecified: Secondary | ICD-10-CM | POA: Diagnosis not present

## 2023-07-04 DIAGNOSIS — I509 Heart failure, unspecified: Secondary | ICD-10-CM | POA: Diagnosis not present

## 2023-07-04 DIAGNOSIS — H3530 Unspecified macular degeneration: Secondary | ICD-10-CM | POA: Diagnosis not present

## 2023-07-11 DIAGNOSIS — I519 Heart disease, unspecified: Secondary | ICD-10-CM | POA: Diagnosis not present

## 2023-07-11 DIAGNOSIS — I509 Heart failure, unspecified: Secondary | ICD-10-CM | POA: Diagnosis not present

## 2023-07-11 DIAGNOSIS — Z950 Presence of cardiac pacemaker: Secondary | ICD-10-CM | POA: Diagnosis not present

## 2023-07-11 DIAGNOSIS — E785 Hyperlipidemia, unspecified: Secondary | ICD-10-CM | POA: Diagnosis not present

## 2023-07-11 DIAGNOSIS — H3530 Unspecified macular degeneration: Secondary | ICD-10-CM | POA: Diagnosis not present

## 2023-07-11 DIAGNOSIS — J449 Chronic obstructive pulmonary disease, unspecified: Secondary | ICD-10-CM | POA: Diagnosis not present

## 2023-07-18 DIAGNOSIS — H3530 Unspecified macular degeneration: Secondary | ICD-10-CM | POA: Diagnosis not present

## 2023-07-18 DIAGNOSIS — E785 Hyperlipidemia, unspecified: Secondary | ICD-10-CM | POA: Diagnosis not present

## 2023-07-18 DIAGNOSIS — J449 Chronic obstructive pulmonary disease, unspecified: Secondary | ICD-10-CM | POA: Diagnosis not present

## 2023-07-18 DIAGNOSIS — I509 Heart failure, unspecified: Secondary | ICD-10-CM | POA: Diagnosis not present

## 2023-07-18 DIAGNOSIS — I519 Heart disease, unspecified: Secondary | ICD-10-CM | POA: Diagnosis not present

## 2023-07-18 DIAGNOSIS — Z950 Presence of cardiac pacemaker: Secondary | ICD-10-CM | POA: Diagnosis not present

## 2023-07-20 DIAGNOSIS — E785 Hyperlipidemia, unspecified: Secondary | ICD-10-CM | POA: Diagnosis not present

## 2023-07-20 DIAGNOSIS — H3530 Unspecified macular degeneration: Secondary | ICD-10-CM | POA: Diagnosis not present

## 2023-07-20 DIAGNOSIS — I519 Heart disease, unspecified: Secondary | ICD-10-CM | POA: Diagnosis not present

## 2023-07-20 DIAGNOSIS — J449 Chronic obstructive pulmonary disease, unspecified: Secondary | ICD-10-CM | POA: Diagnosis not present

## 2023-07-20 DIAGNOSIS — Z950 Presence of cardiac pacemaker: Secondary | ICD-10-CM | POA: Diagnosis not present

## 2023-07-20 DIAGNOSIS — I509 Heart failure, unspecified: Secondary | ICD-10-CM | POA: Diagnosis not present

## 2023-07-25 DIAGNOSIS — Z950 Presence of cardiac pacemaker: Secondary | ICD-10-CM | POA: Diagnosis not present

## 2023-07-25 DIAGNOSIS — H3530 Unspecified macular degeneration: Secondary | ICD-10-CM | POA: Diagnosis not present

## 2023-07-25 DIAGNOSIS — I519 Heart disease, unspecified: Secondary | ICD-10-CM | POA: Diagnosis not present

## 2023-07-25 DIAGNOSIS — I509 Heart failure, unspecified: Secondary | ICD-10-CM | POA: Diagnosis not present

## 2023-07-25 DIAGNOSIS — J449 Chronic obstructive pulmonary disease, unspecified: Secondary | ICD-10-CM | POA: Diagnosis not present

## 2023-07-25 DIAGNOSIS — E785 Hyperlipidemia, unspecified: Secondary | ICD-10-CM | POA: Diagnosis not present

## 2023-07-30 ENCOUNTER — Encounter: Payer: Self-pay | Admitting: "Endocrinology

## 2023-07-30 ENCOUNTER — Ambulatory Visit (INDEPENDENT_AMBULATORY_CARE_PROVIDER_SITE_OTHER): Admitting: "Endocrinology

## 2023-07-30 ENCOUNTER — Ambulatory Visit: Admitting: Nutrition

## 2023-07-30 VITALS — BP 96/80 | HR 85 | Ht 73.0 in | Wt 182.0 lb

## 2023-07-30 DIAGNOSIS — E7800 Pure hypercholesterolemia, unspecified: Secondary | ICD-10-CM | POA: Diagnosis not present

## 2023-07-30 DIAGNOSIS — Z794 Long term (current) use of insulin: Secondary | ICD-10-CM | POA: Diagnosis not present

## 2023-07-30 DIAGNOSIS — E1165 Type 2 diabetes mellitus with hyperglycemia: Secondary | ICD-10-CM

## 2023-07-30 DIAGNOSIS — Z7984 Long term (current) use of oral hypoglycemic drugs: Secondary | ICD-10-CM | POA: Diagnosis not present

## 2023-07-30 LAB — POCT GLYCOSYLATED HEMOGLOBIN (HGB A1C): Hemoglobin A1C: 8.1 % — AB (ref 4.0–5.6)

## 2023-07-30 MED ORDER — CEQUR SIMPLICITY 2U DEVI: 1.0000 | 11 refills | Status: DC

## 2023-07-30 MED ORDER — CEQUR SIMPLICITY INSERTER MISC: 1.0000 | 0 refills | Status: DC

## 2023-07-30 MED ORDER — INSULIN LISPRO 100 UNIT/ML IJ SOLN: 4.0000 [IU] | Freq: Three times a day (TID) | INTRAMUSCULAR | 3 refills | Status: DC

## 2023-07-30 NOTE — Progress Notes (Signed)
 Outpatient Endocrinology Note Tony Birmingham, MD  07/30/23   [name] 02-Aug-1931 [id number]  Referring Provider: Valentin Skates, DO Primary Care Provider: Valentin Skates, DO Reason for consultation: Subjective   Assessment & Plan  Diagnoses and all orders for this visit:  Type 2 diabetes mellitus with hyperglycemia, with long-term current use of insulin  (HCC) -     POCT glycosylated hemoglobin (Hb A1C)  Long term (current) use of oral hypoglycemic drugs  Pure hypercholesterolemia  Other orders -     insulin  lispro (HUMALOG) 100 UNIT/ML injection; Inject 0.04-0.1 mLs (4-10 Units total) into the skin 3 (three) times daily before meals. -     injection device for insulin  (CEQUR SIMPLICITY 2U) DEVI; 1 Device by Other route continuous. -     Injection Device for Insulin  (CEQUR SIMPLICITY INSERTER) MISC; 1 Device by Does not apply route continuous.   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 HF On palliative care, goal A1C 8% C-peptide +++ Lab Results  Component Value Date   GFR 77.10 10/30/2014   Hba1c goal less than 7, current Hba1c is  Lab Results  Component Value Date   HGBA1C 8.1 (A) 07/30/2023   Will recommend the following: Blood sugars with poorly controlled on oral regimen and hence patient needs to start short acting insulin : Patient got diabetes education about it, prefers to try CeQur Continue Lantus  18 unit qam  Start Humalog vial by secure: 2 clicks (4 units) 3 times a day 15 minutes before each meal Continue Jardiance  25 mg every day (rash resolved) Stop linagliptin  5 mg every day Stop nateglinide  120 mg tidac before each meal  Discussed about diet and activity at length On DexCom  No known contraindications/side effects to any of above medications Not interested in short acting insulin /diet adjustment   -Last LD and Tg are as follows: Lab Results  Component Value Date   LDLCALC 44 11/05/2018    Lab Results  Component Value Date    TRIG 90 11/05/2018   -On atorvastatin  80 mg QD -Follow low fat diet and exercise   -Blood pressure goal <140/90 - Microalbumin/creatinine goal is < 30 -Last MA/Cr is as follows: Lab Results  Component Value Date   MICROALBUR 0.6 12/25/2022   -not on ACE/ARB  -diet changes including salt restriction -limit eating outside -counseled BP targets per standards of diabetes care -uncontrolled blood pressure can lead to retinopathy, nephropathy and cardiovascular and atherosclerotic heart disease  Reviewed and counseled on: -A1C target -Blood sugar targets -Complications of uncontrolled diabetes  -Checking blood sugar before meals and bedtime and bring log next visit -All medications with mechanism of action and side effects -Hypoglycemia management: rule of 15's, Glucagon  Emergency Kit and medical alert ID -low-carb low-fat plate-method diet -At least 20 minutes of physical activity per day -Annual dilated retinal eye exam and foot exam -compliance and follow up needs -follow up as scheduled or earlier if problem gets worse  Call if blood sugar is less than 70 or consistently above 250    Take a 15 gm snack of carbohydrate at bedtime before you go to sleep if your blood sugar is less than 100.    If you are going to fast after midnight for a test or procedure, ask your physician for instructions on how to reduce/decrease your insulin  dose.    Call if blood sugar is less than 70 or consistently above 250  -Treating a low sugar by rule of 15  (15 gms of  sugar every 15 min until sugar is more than 70) If you feel your sugar is low, test your sugar to be sure If your sugar is low (less than 70), then take 15 grams of a fast acting Carbohydrate (3-4 glucose tablets or glucose gel or 4 ounces of juice or regular soda) Recheck your sugar 15 min after treating low to make sure it is more than 70 If sugar is still less than 70, treat again with 15 grams of carbohydrate          Don't  drive the hour of hypoglycemia  If unconscious/unable to eat or drink by mouth, use glucagon  injection or nasal spray baqsimi  and call 911. Can repeat again in 15 min if still unconscious.  Return in about 4 weeks (around 08/27/2023).   I have reviewed current medications, nurse's notes, allergies, vital signs, past medical and surgical history, family medical history, and social history for this encounter. Counseled patient on symptoms, examination findings, lab findings, imaging results, treatment decisions and monitoring and prognosis. The patient understood the recommendations and agrees with the treatment plan. All questions regarding treatment plan were fully answered.  Tony Birmingham, MD  07/30/23    History of Present Illness [name] is a 89 y.o. year old male who presents for follow up of Type II diabetes mellitus.  [name] was first diagnosed >20 years ago. Diabetes education +  Home diabetes regimen: Lantus  15 unit qam  Jardiance  125 mg every day  Linagliptin  5 mg every day Nateglinide  120 mg tidac   COMPLICATIONS +  MI/Stroke -  retinopathy -  neuropathy -  nephropathy  BLOOD SUGAR DATA CGM interpretation: At today's visit, we reviewed her CGM downloads. The full report is scanned in the media. Reviewing the CGM trends, BG are elevated all day after each meal, with significant improvement to near normal overnight.    Physical Exam  BP 96/80   Pulse 85   Ht 6' 1 (1.854 m)   Wt 182 lb (82.6 kg)   SpO2 98%   BMI 24.01 kg/m    Constitutional: well developed, well nourished Head: normocephalic, atraumatic Eyes: sclera anicteric, no redness Neck: supple Lungs: normal respiratory effort Neurology: alert and oriented Skin: dry, no appreciable rashes Musculoskeletal: no appreciable defects Psychiatric: normal mood and affect Diabetic Foot Exam - Simple   No data filed      Current Medications Patient's Medications  New Prescriptions    INJECTION DEVICE FOR INSULIN  (CEQUR SIMPLICITY 2U) DEVI    1 Device by Other route continuous.   INJECTION DEVICE FOR INSULIN  (CEQUR SIMPLICITY INSERTER) MISC    1 Device by Does not apply route continuous.   INSULIN  LISPRO (HUMALOG) 100 UNIT/ML INJECTION    Inject 0.04-0.1 mLs (4-10 Units total) into the skin 3 (three) times daily before meals.  Previous Medications   ATORVASTATIN  (LIPITOR ) 80 MG TABLET    TAKE ONE TABLET BY MOUTH EVERY DAY FOR CHOLESTEROL   BLOOD GLUCOSE MONITORING SUPPL DEVI    Use to check blood sugar three times a day. May substitute to any manufacturer covered by patient's insurance. Dx:E11.65   CONTINUOUS GLUCOSE SENSOR (DEXCOM G7 SENSOR) MISC    1 Device by Does not apply route continuous. 1 sensor every 10 days as prescribed   DIGOXIN  (LANOXIN ) 0.125 MG TABLET    Take 1 tablet (0.125 mg total) by mouth daily.   ELIQUIS  5 MG TABS TABLET    TAKE ONE TABLET BY MOUTH TWICE  DAILY   EMPAGLIFLOZIN  (JARDIANCE ) 25 MG TABS TABLET    Take 1 tablet (25 mg total) by mouth daily before breakfast.   EPIPEN 2-PAK 0.3 MG/0.3ML SOAJ INJECTION    Inject 0.3 mg into the muscle daily as needed for anaphylaxis.    ERENUMAB -AOOE (AIMOVIG ) 140 MG/ML SOAJ    Inject 140 mg into the skin every 28 (twenty-eight) days.   FEXOFENADINE-PSEUDOEPHEDRINE (ALLEGRA-D) 60-120 MG 12 HR TABLET    Take 1 tablet by mouth daily.   IBUPROFEN  (ADVIL ) 200 MG TABLET    Take 400 mg by mouth every 8 (eight) hours as needed (headaches/migraine).   INSULIN  GLARGINE (LANTUS  SOLOSTAR) 100 UNIT/ML SOLOSTAR PEN    Start Lantus  10 units once every morning. Increase by 1 units a every day until fasting blood sugar is less than 150. Stay on that dose.   INSULIN  PEN NEEDLE 32G X 4 MM MISC    1 Device by Does not apply route daily.   LINAGLIPTIN  (TRADJENTA ) 5 MG TABS TABLET    Take 1 tablet (5 mg total) by mouth daily. Take 1 tab daily before breakfast   MAGNESIUM  250 MG TABS    Take 250 mg by mouth in the morning.   METOPROLOL   SUCCINATE (TOPROL -XL) 25 MG 24 HR TABLET    TAKE ONE TABLET BY MOUTH TWICE DAILY WITH OR immediately following A MEAL   NATEGLINIDE  (STARLIX ) 120 MG TABLET    Take 1 tablet (120 mg total) by mouth 3 (three) times daily with meals.   POTASSIUM CHLORIDE  (KLOR-CON ) 10 MEQ TABLET    Take 1 tablet (10 mEq total) by mouth daily.   TORSEMIDE  (DEMADEX ) 20 MG TABLET    Take 1 tablet (20 mg total) by mouth daily.  Modified Medications   No medications on file  Discontinued Medications   No medications on file    Allergies Allergies  Allergen Reactions   Bee Venom Anaphylaxis   Latex Other (See Comments) and Rash    REDNESS AT REACTION SITE.   Metformin Diarrhea   Rivaroxaban  Other (See Comments)    Headaches, blurred vision  Other Reaction(s): Dizziness   Sotalol Other (See Comments)    BLUE TOES- cut his circulation off   Levofloxacin     long QT syndrome, so avoid   Warfarin Sodium Other (See Comments)    migraine headaches/vision impariment    Past Medical History Past Medical History:  Diagnosis Date   Acute on chronic combined systolic (congestive) and diastolic (congestive) heart failure (HCC)    AICD (automatic cardioverter/defibrillator) present    Medtronicn PPM/ICD   Carotid stenosis, right    s/p endarterectomy 11/07/18   Chronic combined systolic (congestive) and diastolic (congestive) heart failure (HCC)    Coronary artery disease    [facility]   Diabetes mellitus    Dysrhythmia    Endocarditis, valve unspecified    Mitral   Headache    hx migraines 6-7 x mo   History of migraine headaches    History of seasonal allergies    ICD (implantable cardiac defibrillator) in place    Optic nerve hemorrhage, 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  We will monitor this closely and look for changes over the ensuing 6 months.  Repeat visit here in 6 months   PAF  (paroxysmal atrial fibrillation) (HCC)    NO LAA occlusion at the time of surgery  [name] 2018   Pleural effusion    PVC's (premature ventricular contractions)    Ventricular tachycardia, polymorphic (HCC)    status post ICD implantation    Past Surgical History Past Surgical History:  Procedure Laterality Date   BIV UPGRADE N/A 03/22/2022   Procedure: BIV UPGRADE;  Surgeon: Fernande Elspeth BROCKS, MD;  Location: [facility] INVASIVE CV LAB;  Service: Cardiovascular;  Laterality: N/A;   BRONCHIAL BIOPSY  07/22/2020   Procedure: BRONCHIAL BIOPSIES;  Surgeon: Shelah Lamar RAMAN, MD;  Location: [facility] ENDOSCOPY;  Service: Cardiopulmonary;;   BRONCHIAL BRUSHINGS  07/22/2020   Procedure: BRONCHIAL BRUSHINGS;  Surgeon: Shelah Lamar RAMAN, MD;  Location: [facility] ENDOSCOPY;  Service: Cardiopulmonary;;   BRONCHIAL WASHINGS  07/22/2020   Procedure: BRONCHIAL WASHINGS;  Surgeon: Shelah Lamar RAMAN, MD;  Location: [facility] ENDOSCOPY;  Service: Cardiopulmonary;;   CARDIAC CATHETERIZATION  04/03/2007   EF 40%   CARDIAC CATHETERIZATION  02/06/2006   EF 45%. ANTERIOR HYPOKINESIS   CARDIAC CATHETERIZATION  05/29/2000   EF 50%. MILD ANTERIOR HYPOKINESIS   CARDIAC CATHETERIZATION  10/25/1999   EF 50%. SEVERE MITRAL REGURGITATION   CARDIAC CATHETERIZATION  01/06/2003   EF 50%   CARDIAC DEFIBRILLATOR PLACEMENT     CARDIOVERSION N/A 02/07/2019   Procedure: CARDIOVERSION;  Surgeon: Okey Vina GAILS, MD;  Location: [facility] ENDOSCOPY;  Service: Cardiovascular;  Laterality: N/A;   CARDIOVERSION N/A 11/24/2019   Procedure: CARDIOVERSION;  Surgeon: Delford Maude BROCKS, MD;  Location: [facility] ENDOSCOPY;  Service: Cardiovascular;  Laterality: N/A;   CARDIOVERSION N/A 02/15/2022   Procedure: CARDIOVERSION;  Surgeon: Jeffrie Oneil BROCKS, MD;  Location: [facility] ENDOSCOPY;  Service: Cardiovascular;  Laterality: N/A;   CARDIOVERSION N/A 03/03/2022   Procedure: CARDIOVERSION;  Surgeon: Loni Soyla LABOR, MD;  Location: [facility] ENDOSCOPY;  Service: Cardiovascular;  Laterality: N/A;   CATARACT  EXTRACTION W/ INTRAOCULAR LENS  IMPLANT, BILATERAL     CHOLECYSTECTOMY N/A 01/08/2019   Procedure: LAPAROSCOPIC CHOLECYSTECTOMY WITH INTRAOPERATIVE CHOLANGIOGRAM;  Surgeon: Belinda Cough, MD;  Location: [facility] OR;  Service: General;  Laterality: N/A;   CORONARY ARTERY BYPASS GRAFT  2001   2 VESSEL CABG AND MITRAL VALVE REPAIR. HE HAD A LIMA GRAFT TO THE LAD AND RADIAL ARTERY  GRAFT TO THE OBTUSE MARGINAL  OF LEFT CIRCUMFLEX   CORONARY PRESSURE/FFR STUDY N/A 01/05/2017   Procedure: INTRAVASCULAR PRESSURE WIRE/FFR STUDY;  Surgeon: Wonda Sharper, MD;  Location: [facility] INVASIVE CV LAB;  Service: Cardiovascular;  Laterality: N/A;   ENDARTERECTOMY Right 11/07/2018   Procedure: ENDARTERECTOMY CAROTID RIGHT;  Surgeon: Sheree Penne Bruckner, MD;  Location: [facility] OR;  Service: Vascular;  Laterality: Right;   EP IMPLANTABLE DEVICE N/A 11/06/2014   Procedure: ICD Generator Changeout;  Surgeon: Elspeth BROCKS Fernande, MD;  Location: [facility] INVASIVE CV LAB;  Service: Cardiovascular;  Laterality: N/A;   ERCP N/A 01/10/2019   Procedure: ENDOSCOPIC RETROGRADE CHOLANGIOPANCREATOGRAPHY (ERCP);  Surgeon: Rosalie Kitchens, MD;  Location: [facility] ENDOSCOPY;  Service: Endoscopy;  Laterality: N/A;   FOREIGN BODY REMOVAL Right 06/21/2017   Procedure: FOREIGN BODY REMOVAL ADULT RIGHT RING FINGER;  Surgeon: Murrell Drivers, MD;  Location: [facility] OR;  Service: Orthopedics;  Laterality: Right;   HEMORRHOID SURGERY     HEMORROIDECTOMY     KNEE ARTHROSCOPY     RIGHT KNEE   LAPAROSCOPIC APPENDECTOMY N/A 01/08/2019   Procedure: APPENDECTOMY LAPAROSCOPIC;  Surgeon: Belinda Cough, MD;  Location: [facility] OR;  Service: General;  Laterality: N/A;   LEFT HEART CATHETERIZATION WITH CORONARY ANGIOGRAM N/A 07/04/2012   Procedure: LEFT HEART CATHETERIZATION WITH CORONARY ANGIOGRAM;  Surgeon: Ozell Fell, MD;  Location: [facility] CATH LAB;  Service: Cardiovascular;  Laterality: N/A;   MITRAL VALVE REPLACEMENT     No LAA occlusion at the time of surgery  CHRISTELLA Lawyer report 2018   REMOVAL  OF STONES  01/10/2019   Procedure: REMOVAL OF STONES;  Surgeon: Rosalie Kitchens, MD;  Location: [facility] ENDOSCOPY;  Service: Endoscopy;;   RIGHT/LEFT HEART CATH AND CORONARY/GRAFT ANGIOGRAPHY N/A 01/05/2017   Procedure: RIGHT/LEFT HEART CATH AND CORONARY/GRAFT ANGIOGRAPHY;  Surgeon: Fell Ozell, MD;  Location[facility] INVASIVE CV LAB;  Service: Cardiovascular;  Laterality: N/A;   SPHINCTEROTOMY  01/10/2019   Procedure: SPHINCTEROTOMY;  Surgeon: Rosalie Kitchens, MD;  Location: [facility] ENDOSCOPY;  Service: Endoscopy;;   TRANSTHORACIC ECHOCARDIOGRAM  04/02/2007   EF 35%   VIDEO BRONCHOSCOPY Right 07/22/2020   Procedure: VIDEO BRONCHOSCOPY WITH FLUORO, BAL AND BIOPSY;  Surgeon: Shelah Lamar RAMAN, MD;  Location: [facility] ENDOSCOPY;  Service: Cardiopulmonary;  Laterality: Right;  CONSCIOUS SEDATION OR MAC    Family History family history includes Cancer in his sister and another family member; Diabetes in an other family member; Emphysema in his father; Heart disease in his father, paternal grandfather, and paternal grandmother; Stroke in his mother.  Social History Social History   Socioeconomic History   Marital status: Married    Spouse name: Avelina   Number of children: 2   Years of education: Not on file   Highest education level: Associate degree: academic program  Occupational History   Occupation: retired  Tobacco Use   Smoking status: Former    Current packs/day: 0.00    Average packs/day: 2.0 packs/day for 16.0 years (32.1 ttl pk-yrs)    Types: Cigarettes    Start date: 02/23/1956    Quit date: 01/31/1971    Years since quitting: 52.5   Smokeless tobacco: Never  Vaping Use   Vaping status: Never Used  Substance and Sexual Activity   Alcohol  use: Yes    Alcohol /week: 0.0 standard drinks of alcohol     Comment: 1-2 a week   Drug use: No   Sexual activity: Yes  Other Topics Concern   Not on file  Social History Narrative   Patient is right-handed. He lives with his wife ina 2 story home. He drinks one  cup of coffee and a diet coke a day.    Social Drivers of Corporate investment banker Strain: Not on file  Food Insecurity: Not on file  Transportation Needs: Not on file  Physical Activity: Not on file  Stress: Not on file  Social Connections: Not on file  Intimate Partner Violence: Not on file    Lab Results  Component Value Date   HGBA1C 8.1 (A) 07/30/2023   HGBA1C 7.8 (A) 04/23/2023   HGBA1C 9.4 (A) 01/26/2023   Lab Results  Component Value Date   CHOL 110 11/05/2018   Lab Results  Component Value Date   HDL 48 11/05/2018   Lab Results  Component Value Date   LDLCALC 44 11/05/2018   Lab Results  Component Value Date   TRIG 90 11/05/2018   Lab Results  Component Value Date   CHOLHDL 2.3 11/05/2018   Lab Results  Component Value Date   CREATININE 1.04 04/24/2023   Lab Results  Component Value Date   GFR 77.10 10/30/2014   Lab Results  Component Value Date   MICROALBUR 0.6 12/25/2022      Component Value Date/Time   NA 136 04/24/2023 1408   K 4.8 04/24/2023 1408  CL 93 (L) 04/24/2023 1408   CO2 28 04/24/2023 1408   GLUCOSE 280 (H) 04/24/2023 1408   GLUCOSE 380 (H) 12/25/2022 1431   BUN 18 04/24/2023 1408   CREATININE 1.04 04/24/2023 1408   CREATININE 1.17 12/25/2022 1431   CALCIUM  9.5 04/24/2023 1408   PROT 8.2 (H) 12/25/2022 1431   PROT 7.4 03/14/2022 1627   ALBUMIN 4.5 03/14/2022 1627   AST 29 12/25/2022 1431   ALT 28 12/25/2022 1431   ALKPHOS 183 (H) 03/14/2022 1627   BILITOT 1.7 (H) 12/25/2022 1431   BILITOT 3.0 (H) 03/14/2022 1627   GFRNONAA >60 07/14/2020 1124   GFRAA 74 09/24/2019 1139      Latest Ref Rng & Units 04/24/2023    2:08 PM 12/25/2022    2:31 PM 12/05/2022    8:52 AM  BMP  Glucose 70 - 99 mg/dL 719  619  730   BUN 8 - 27 mg/dL 18  27  25    Creatinine 0.76 - 1.27 mg/dL 8.95  8.82  8.87   BUN/Creat Ratio 10 - 24 17  23  22    Sodium 134 - 144 mmol/L 136  133  134   Potassium 3.5 - 5.2 mmol/L 4.8  4.4  5.3   Chloride  96 - 106 mmol/L 93  94  93   CO2 20 - 29 mmol/L 28  29  27    Calcium  8.6 - 10.2 mg/dL 9.5  9.3  9.4        Component Value Date/Time   WBC 7.0 04/24/2023 1408   WBC 6.6 07/14/2020 1124   RBC 4.65 04/24/2023 1408   RBC 4.30 07/14/2020 1124   HGB 16.5 04/24/2023 1408   HCT 48.1 04/24/2023 1408   PLT 136 (L) 04/24/2023 1408   MCV 103 (H) 04/24/2023 1408   MCH 35.5 (H) 04/24/2023 1408   MCH 32.8 07/14/2020 1124   MCHC 34.3 04/24/2023 1408   MCHC 32.4 07/14/2020 1124   RDW 12.6 04/24/2023 1408   LYMPHSABS 0.6 (L) 07/14/2020 1124   LYMPHSABS 0.9 12/27/2016 1523   MONOABS 0.6 07/14/2020 1124   EOSABS 0.3 07/14/2020 1124   EOSABS 0.2 12/27/2016 1523   BASOSABS 0.0 07/14/2020 1124   BASOSABS 0.0 12/27/2016 1523     Parts of this note may have been dictated using voice recognition software. There may be variances in spelling and vocabulary which are unintentional. Not all errors are proofread. Please notify the dino if any discrepancies are noted or if the meaning of any statement is not clear.

## 2023-07-30 NOTE — Patient Instructions (Signed)

## 2023-07-31 ENCOUNTER — Other Ambulatory Visit: Payer: Self-pay | Admitting: "Endocrinology

## 2023-07-31 ENCOUNTER — Other Ambulatory Visit: Payer: Self-pay

## 2023-07-31 ENCOUNTER — Encounter: Payer: Self-pay | Admitting: "Endocrinology

## 2023-07-31 DIAGNOSIS — Z794 Long term (current) use of insulin: Secondary | ICD-10-CM

## 2023-07-31 DIAGNOSIS — H3530 Unspecified macular degeneration: Secondary | ICD-10-CM | POA: Diagnosis not present

## 2023-07-31 DIAGNOSIS — J449 Chronic obstructive pulmonary disease, unspecified: Secondary | ICD-10-CM | POA: Diagnosis not present

## 2023-07-31 DIAGNOSIS — I509 Heart failure, unspecified: Secondary | ICD-10-CM | POA: Diagnosis not present

## 2023-07-31 DIAGNOSIS — I480 Paroxysmal atrial fibrillation: Secondary | ICD-10-CM | POA: Diagnosis not present

## 2023-07-31 DIAGNOSIS — N189 Chronic kidney disease, unspecified: Secondary | ICD-10-CM | POA: Diagnosis not present

## 2023-07-31 DIAGNOSIS — E785 Hyperlipidemia, unspecified: Secondary | ICD-10-CM | POA: Diagnosis not present

## 2023-07-31 DIAGNOSIS — I519 Heart disease, unspecified: Secondary | ICD-10-CM | POA: Diagnosis not present

## 2023-07-31 DIAGNOSIS — I714 Abdominal aortic aneurysm, without rupture, unspecified: Secondary | ICD-10-CM | POA: Diagnosis not present

## 2023-07-31 DIAGNOSIS — Z7901 Long term (current) use of anticoagulants: Secondary | ICD-10-CM | POA: Diagnosis not present

## 2023-07-31 DIAGNOSIS — Z8673 Personal history of transient ischemic attack (TIA), and cerebral infarction without residual deficits: Secondary | ICD-10-CM | POA: Diagnosis not present

## 2023-07-31 DIAGNOSIS — I10 Essential (primary) hypertension: Secondary | ICD-10-CM | POA: Diagnosis not present

## 2023-07-31 DIAGNOSIS — Z950 Presence of cardiac pacemaker: Secondary | ICD-10-CM | POA: Diagnosis not present

## 2023-07-31 DIAGNOSIS — E118 Type 2 diabetes mellitus with unspecified complications: Secondary | ICD-10-CM | POA: Diagnosis not present

## 2023-07-31 MED ORDER — PEN NEEDLES 32G X 4 MM MISC: 1.0000 | Freq: Four times a day (QID) | 2 refills | Status: DC

## 2023-07-31 MED ORDER — INSULIN LISPRO (1 UNIT DIAL) 100 UNIT/ML (KWIKPEN)
4.0000 [IU] | PEN_INJECTOR | Freq: Three times a day (TID) | SUBCUTANEOUS | 1 refills | Status: DC
Start: 2023-07-31 — End: 2023-11-13

## 2023-07-31 MED ORDER — INSULIN LISPRO (1 UNIT DIAL) 100 UNIT/ML (KWIKPEN): 4.0000 [IU] | PEN_INJECTOR | Freq: Three times a day (TID) | SUBCUTANEOUS | 1 refills | Status: DC

## 2023-08-01 ENCOUNTER — Telehealth: Payer: Self-pay | Admitting: Nutrition

## 2023-08-01 DIAGNOSIS — H3530 Unspecified macular degeneration: Secondary | ICD-10-CM | POA: Diagnosis not present

## 2023-08-01 DIAGNOSIS — J449 Chronic obstructive pulmonary disease, unspecified: Secondary | ICD-10-CM | POA: Diagnosis not present

## 2023-08-01 DIAGNOSIS — E785 Hyperlipidemia, unspecified: Secondary | ICD-10-CM | POA: Diagnosis not present

## 2023-08-01 DIAGNOSIS — I519 Heart disease, unspecified: Secondary | ICD-10-CM | POA: Diagnosis not present

## 2023-08-01 DIAGNOSIS — I509 Heart failure, unspecified: Secondary | ICD-10-CM | POA: Diagnosis not present

## 2023-08-01 DIAGNOSIS — Z950 Presence of cardiac pacemaker: Secondary | ICD-10-CM | POA: Diagnosis not present

## 2023-08-01 NOTE — Telephone Encounter (Signed)
 Pt. Left a voice message saying he met with the palliative care nurse and she told him that the Cequr was a waste of his time and money so he is going to forget about getting it.

## 2023-08-02 NOTE — Telephone Encounter (Signed)
 Lvm for pt to call back.

## 2023-08-04 ENCOUNTER — Encounter: Payer: Self-pay | Admitting: "Endocrinology

## 2023-08-07 ENCOUNTER — Telehealth: Payer: Self-pay | Admitting: Dietician

## 2023-08-07 ENCOUNTER — Telehealth: Payer: Self-pay

## 2023-08-07 ENCOUNTER — Telehealth: Payer: Self-pay | Admitting: "Endocrinology

## 2023-08-07 NOTE — Telephone Encounter (Signed)
 Due to some confused with mychart message I called and spoke to pt regarding clarification on cequr insulin  pump. I asked the pt if we could get the insulin  pump cequr approved by he's insurance did he still want to use it? Pt stated that he has decided that he does not want to have to deal with the insulin  pump or have to take insulin  either. Pt said that he would rather take oral medication for his diabetes moving forward. I informed pt the I would forward the message to Dr Dartha so that she could make a recommendation on what to do for pt moving forward. Please advise if you would like pt to schedule an appt to discuss other medication option?

## 2023-08-07 NOTE — Telephone Encounter (Signed)
 Patient called and stated that since he is starting the Humalog , could he stop one of his other diabetes medications?  Or could he have a different option for Trajenta as this is expensive ($140/month).  Will forward to MD and MA.  Leita Constable, RD, LDN, CDCES, DipACLM

## 2023-08-07 NOTE — Telephone Encounter (Signed)
 Called patient due to request from Dr. Dartha.   Dexcom reviewed.  Last download wsa 7/30.  TIR is 18%.  Fasting glucose <150 without lows.  Post meal glucose is up to 360.  Patient is on Lantus  18 units.  Did not like the CeQur Simplicity and continues his other medications (Starlix , Trajenta, and Jardiance ).    He states that he has had diabetes for years and does not wish to start additional insulin  and would just like additional oral medications.  I discussed the diabetes medication classes, that he is on many of the classes without lowing of his blood glucose as well as side effects making other drug options not appropriate.  Discussed that he cannot take 2 drugs in one class and that the tools that we have other than insulin  are running out as options.  After some discussion, patient has agreed to try the Humalog  4-6 units before meals.  Instructed him to try this before his largest meal which is at dinner, monitor his Dexcom and also take before lunch.  Patient to call for questions.  Leita Constable, RD, LDN, CDCES, DipACLM

## 2023-08-07 NOTE — Telephone Encounter (Signed)
 I called patient to find out about insurance coverage for his 04/23/2023 visit and he asked me to relay the message that he does not want to do insulin  injections.  Patient states that he was diagnosed in 1972 and has never taken insulin  and would prefer not to start at his age.  Patient states that there are new medications on the market and would like to try one of those.  He did not name any specific medication.  Patient states that he would like to discuss this if someone would call him back.

## 2023-08-08 ENCOUNTER — Encounter: Payer: Self-pay | Admitting: "Endocrinology

## 2023-08-08 DIAGNOSIS — Z950 Presence of cardiac pacemaker: Secondary | ICD-10-CM | POA: Diagnosis not present

## 2023-08-08 DIAGNOSIS — H3530 Unspecified macular degeneration: Secondary | ICD-10-CM | POA: Diagnosis not present

## 2023-08-08 DIAGNOSIS — I519 Heart disease, unspecified: Secondary | ICD-10-CM | POA: Diagnosis not present

## 2023-08-08 DIAGNOSIS — J449 Chronic obstructive pulmonary disease, unspecified: Secondary | ICD-10-CM | POA: Diagnosis not present

## 2023-08-08 DIAGNOSIS — E785 Hyperlipidemia, unspecified: Secondary | ICD-10-CM | POA: Diagnosis not present

## 2023-08-08 DIAGNOSIS — I509 Heart failure, unspecified: Secondary | ICD-10-CM | POA: Diagnosis not present

## 2023-08-08 NOTE — Progress Notes (Signed)
 Remote pacemaker transmission.

## 2023-08-13 ENCOUNTER — Other Ambulatory Visit: Payer: Self-pay | Admitting: Cardiovascular Disease

## 2023-08-13 DIAGNOSIS — E785 Hyperlipidemia, unspecified: Secondary | ICD-10-CM | POA: Diagnosis not present

## 2023-08-13 DIAGNOSIS — I509 Heart failure, unspecified: Secondary | ICD-10-CM | POA: Diagnosis not present

## 2023-08-13 DIAGNOSIS — I519 Heart disease, unspecified: Secondary | ICD-10-CM | POA: Diagnosis not present

## 2023-08-13 DIAGNOSIS — H3530 Unspecified macular degeneration: Secondary | ICD-10-CM | POA: Diagnosis not present

## 2023-08-13 DIAGNOSIS — Z950 Presence of cardiac pacemaker: Secondary | ICD-10-CM | POA: Diagnosis not present

## 2023-08-13 DIAGNOSIS — J449 Chronic obstructive pulmonary disease, unspecified: Secondary | ICD-10-CM | POA: Diagnosis not present

## 2023-08-15 DIAGNOSIS — H3530 Unspecified macular degeneration: Secondary | ICD-10-CM | POA: Diagnosis not present

## 2023-08-15 DIAGNOSIS — Z950 Presence of cardiac pacemaker: Secondary | ICD-10-CM | POA: Diagnosis not present

## 2023-08-15 DIAGNOSIS — I519 Heart disease, unspecified: Secondary | ICD-10-CM | POA: Diagnosis not present

## 2023-08-15 DIAGNOSIS — I509 Heart failure, unspecified: Secondary | ICD-10-CM | POA: Diagnosis not present

## 2023-08-15 DIAGNOSIS — J449 Chronic obstructive pulmonary disease, unspecified: Secondary | ICD-10-CM | POA: Diagnosis not present

## 2023-08-15 DIAGNOSIS — E785 Hyperlipidemia, unspecified: Secondary | ICD-10-CM | POA: Diagnosis not present

## 2023-08-17 ENCOUNTER — Encounter: Admitting: Internal Medicine

## 2023-08-21 ENCOUNTER — Encounter: Admitting: Internal Medicine

## 2023-08-22 DIAGNOSIS — I509 Heart failure, unspecified: Secondary | ICD-10-CM | POA: Diagnosis not present

## 2023-08-22 DIAGNOSIS — I519 Heart disease, unspecified: Secondary | ICD-10-CM | POA: Diagnosis not present

## 2023-08-22 DIAGNOSIS — H3530 Unspecified macular degeneration: Secondary | ICD-10-CM | POA: Diagnosis not present

## 2023-08-22 DIAGNOSIS — E785 Hyperlipidemia, unspecified: Secondary | ICD-10-CM | POA: Diagnosis not present

## 2023-08-22 DIAGNOSIS — J449 Chronic obstructive pulmonary disease, unspecified: Secondary | ICD-10-CM | POA: Diagnosis not present

## 2023-08-22 DIAGNOSIS — Z950 Presence of cardiac pacemaker: Secondary | ICD-10-CM | POA: Diagnosis not present

## 2023-08-30 DIAGNOSIS — Z950 Presence of cardiac pacemaker: Secondary | ICD-10-CM | POA: Diagnosis not present

## 2023-08-30 DIAGNOSIS — J449 Chronic obstructive pulmonary disease, unspecified: Secondary | ICD-10-CM | POA: Diagnosis not present

## 2023-08-30 DIAGNOSIS — I519 Heart disease, unspecified: Secondary | ICD-10-CM | POA: Diagnosis not present

## 2023-08-30 DIAGNOSIS — E785 Hyperlipidemia, unspecified: Secondary | ICD-10-CM | POA: Diagnosis not present

## 2023-08-30 DIAGNOSIS — H3530 Unspecified macular degeneration: Secondary | ICD-10-CM | POA: Diagnosis not present

## 2023-08-30 DIAGNOSIS — I509 Heart failure, unspecified: Secondary | ICD-10-CM | POA: Diagnosis not present

## 2023-08-31 DIAGNOSIS — Z950 Presence of cardiac pacemaker: Secondary | ICD-10-CM | POA: Diagnosis not present

## 2023-08-31 DIAGNOSIS — Z8673 Personal history of transient ischemic attack (TIA), and cerebral infarction without residual deficits: Secondary | ICD-10-CM | POA: Diagnosis not present

## 2023-08-31 DIAGNOSIS — H3530 Unspecified macular degeneration: Secondary | ICD-10-CM | POA: Diagnosis not present

## 2023-08-31 DIAGNOSIS — Z7901 Long term (current) use of anticoagulants: Secondary | ICD-10-CM | POA: Diagnosis not present

## 2023-08-31 DIAGNOSIS — I519 Heart disease, unspecified: Secondary | ICD-10-CM | POA: Diagnosis not present

## 2023-08-31 DIAGNOSIS — I10 Essential (primary) hypertension: Secondary | ICD-10-CM | POA: Diagnosis not present

## 2023-08-31 DIAGNOSIS — J449 Chronic obstructive pulmonary disease, unspecified: Secondary | ICD-10-CM | POA: Diagnosis not present

## 2023-08-31 DIAGNOSIS — I714 Abdominal aortic aneurysm, without rupture, unspecified: Secondary | ICD-10-CM | POA: Diagnosis not present

## 2023-08-31 DIAGNOSIS — E118 Type 2 diabetes mellitus with unspecified complications: Secondary | ICD-10-CM | POA: Diagnosis not present

## 2023-08-31 DIAGNOSIS — E785 Hyperlipidemia, unspecified: Secondary | ICD-10-CM | POA: Diagnosis not present

## 2023-08-31 DIAGNOSIS — I509 Heart failure, unspecified: Secondary | ICD-10-CM | POA: Diagnosis not present

## 2023-08-31 DIAGNOSIS — N189 Chronic kidney disease, unspecified: Secondary | ICD-10-CM | POA: Diagnosis not present

## 2023-08-31 DIAGNOSIS — I480 Paroxysmal atrial fibrillation: Secondary | ICD-10-CM | POA: Diagnosis not present

## 2023-09-03 DIAGNOSIS — H3530 Unspecified macular degeneration: Secondary | ICD-10-CM | POA: Diagnosis not present

## 2023-09-03 DIAGNOSIS — J449 Chronic obstructive pulmonary disease, unspecified: Secondary | ICD-10-CM | POA: Diagnosis not present

## 2023-09-03 DIAGNOSIS — I509 Heart failure, unspecified: Secondary | ICD-10-CM | POA: Diagnosis not present

## 2023-09-03 DIAGNOSIS — E785 Hyperlipidemia, unspecified: Secondary | ICD-10-CM | POA: Diagnosis not present

## 2023-09-03 DIAGNOSIS — Z950 Presence of cardiac pacemaker: Secondary | ICD-10-CM | POA: Diagnosis not present

## 2023-09-03 DIAGNOSIS — I519 Heart disease, unspecified: Secondary | ICD-10-CM | POA: Diagnosis not present

## 2023-09-05 DIAGNOSIS — I519 Heart disease, unspecified: Secondary | ICD-10-CM | POA: Diagnosis not present

## 2023-09-05 DIAGNOSIS — H3530 Unspecified macular degeneration: Secondary | ICD-10-CM | POA: Diagnosis not present

## 2023-09-05 DIAGNOSIS — E785 Hyperlipidemia, unspecified: Secondary | ICD-10-CM | POA: Diagnosis not present

## 2023-09-05 DIAGNOSIS — Z950 Presence of cardiac pacemaker: Secondary | ICD-10-CM | POA: Diagnosis not present

## 2023-09-05 DIAGNOSIS — J449 Chronic obstructive pulmonary disease, unspecified: Secondary | ICD-10-CM | POA: Diagnosis not present

## 2023-09-05 DIAGNOSIS — I509 Heart failure, unspecified: Secondary | ICD-10-CM | POA: Diagnosis not present

## 2023-09-10 ENCOUNTER — Other Ambulatory Visit: Payer: Self-pay

## 2023-09-10 ENCOUNTER — Other Ambulatory Visit: Payer: Self-pay | Admitting: Cardiovascular Disease

## 2023-09-10 DIAGNOSIS — J449 Chronic obstructive pulmonary disease, unspecified: Secondary | ICD-10-CM | POA: Diagnosis not present

## 2023-09-10 DIAGNOSIS — I4891 Unspecified atrial fibrillation: Secondary | ICD-10-CM

## 2023-09-10 DIAGNOSIS — Z794 Long term (current) use of insulin: Secondary | ICD-10-CM

## 2023-09-10 DIAGNOSIS — E785 Hyperlipidemia, unspecified: Secondary | ICD-10-CM | POA: Diagnosis not present

## 2023-09-10 DIAGNOSIS — I519 Heart disease, unspecified: Secondary | ICD-10-CM | POA: Diagnosis not present

## 2023-09-10 DIAGNOSIS — H3530 Unspecified macular degeneration: Secondary | ICD-10-CM | POA: Diagnosis not present

## 2023-09-10 DIAGNOSIS — Z950 Presence of cardiac pacemaker: Secondary | ICD-10-CM | POA: Diagnosis not present

## 2023-09-10 DIAGNOSIS — I509 Heart failure, unspecified: Secondary | ICD-10-CM | POA: Diagnosis not present

## 2023-09-10 DIAGNOSIS — E1165 Type 2 diabetes mellitus with hyperglycemia: Secondary | ICD-10-CM

## 2023-09-10 MED ORDER — NATEGLINIDE 120 MG PO TABS: 120.0000 mg | ORAL_TABLET | Freq: Three times a day (TID) | ORAL | 5 refills | Status: AC

## 2023-09-12 DIAGNOSIS — Z950 Presence of cardiac pacemaker: Secondary | ICD-10-CM | POA: Diagnosis not present

## 2023-09-12 DIAGNOSIS — J449 Chronic obstructive pulmonary disease, unspecified: Secondary | ICD-10-CM | POA: Diagnosis not present

## 2023-09-12 DIAGNOSIS — I509 Heart failure, unspecified: Secondary | ICD-10-CM | POA: Diagnosis not present

## 2023-09-12 DIAGNOSIS — I519 Heart disease, unspecified: Secondary | ICD-10-CM | POA: Diagnosis not present

## 2023-09-12 DIAGNOSIS — E785 Hyperlipidemia, unspecified: Secondary | ICD-10-CM | POA: Diagnosis not present

## 2023-09-12 DIAGNOSIS — H3530 Unspecified macular degeneration: Secondary | ICD-10-CM | POA: Diagnosis not present

## 2023-09-19 DIAGNOSIS — E785 Hyperlipidemia, unspecified: Secondary | ICD-10-CM | POA: Diagnosis not present

## 2023-09-19 DIAGNOSIS — Z950 Presence of cardiac pacemaker: Secondary | ICD-10-CM | POA: Diagnosis not present

## 2023-09-19 DIAGNOSIS — I509 Heart failure, unspecified: Secondary | ICD-10-CM | POA: Diagnosis not present

## 2023-09-19 DIAGNOSIS — H3530 Unspecified macular degeneration: Secondary | ICD-10-CM | POA: Diagnosis not present

## 2023-09-19 DIAGNOSIS — J449 Chronic obstructive pulmonary disease, unspecified: Secondary | ICD-10-CM | POA: Diagnosis not present

## 2023-09-19 DIAGNOSIS — I519 Heart disease, unspecified: Secondary | ICD-10-CM | POA: Diagnosis not present

## 2023-09-20 ENCOUNTER — Ambulatory Visit (INDEPENDENT_AMBULATORY_CARE_PROVIDER_SITE_OTHER): Payer: Medicare Other

## 2023-09-20 DIAGNOSIS — I4729 Other ventricular tachycardia: Secondary | ICD-10-CM | POA: Diagnosis not present

## 2023-09-20 DIAGNOSIS — J449 Chronic obstructive pulmonary disease, unspecified: Secondary | ICD-10-CM | POA: Diagnosis not present

## 2023-09-20 DIAGNOSIS — H3530 Unspecified macular degeneration: Secondary | ICD-10-CM | POA: Diagnosis not present

## 2023-09-20 DIAGNOSIS — Z950 Presence of cardiac pacemaker: Secondary | ICD-10-CM | POA: Diagnosis not present

## 2023-09-20 DIAGNOSIS — E785 Hyperlipidemia, unspecified: Secondary | ICD-10-CM | POA: Diagnosis not present

## 2023-09-20 DIAGNOSIS — I509 Heart failure, unspecified: Secondary | ICD-10-CM | POA: Diagnosis not present

## 2023-09-20 DIAGNOSIS — I519 Heart disease, unspecified: Secondary | ICD-10-CM | POA: Diagnosis not present

## 2023-09-20 LAB — CUP PACEART REMOTE DEVICE CHECK
Battery Remaining Longevity: 94 mo
Battery Voltage: 3 V
Brady Statistic RA Percent Paced: 0 %
Brady Statistic RV Percent Paced: 91.85 %
Date Time Interrogation Session: 20250820231228
Implantable Lead Connection Status: 753985
Implantable Lead Connection Status: 753985
Implantable Lead Connection Status: 753985
Implantable Lead Implant Date: 20040309
Implantable Lead Implant Date: 20040309
Implantable Lead Implant Date: 20240221
Implantable Lead Location: 753858
Implantable Lead Location: 753859
Implantable Lead Location: 753860
Implantable Lead Model: 158
Implantable Lead Model: 158
Implantable Lead Model: 5076
Implantable Lead Serial Number: 115061
Implantable Pulse Generator Implant Date: 20240221
Lead Channel Impedance Value: 1026 Ohm
Lead Channel Impedance Value: 1140 Ohm
Lead Channel Impedance Value: 323 Ohm
Lead Channel Impedance Value: 418 Ohm
Lead Channel Impedance Value: 456 Ohm
Lead Channel Impedance Value: 475 Ohm
Lead Channel Impedance Value: 532 Ohm
Lead Channel Impedance Value: 551 Ohm
Lead Channel Impedance Value: 570 Ohm
Lead Channel Impedance Value: 684 Ohm
Lead Channel Impedance Value: 684 Ohm
Lead Channel Impedance Value: 874 Ohm
Lead Channel Impedance Value: 893 Ohm
Lead Channel Impedance Value: 950 Ohm
Lead Channel Pacing Threshold Amplitude: 0.75 V
Lead Channel Pacing Threshold Amplitude: 1.5 V
Lead Channel Pacing Threshold Pulse Width: 0.4 ms
Lead Channel Pacing Threshold Pulse Width: 1 ms
Lead Channel Sensing Intrinsic Amplitude: 1.75 mV
Lead Channel Sensing Intrinsic Amplitude: 1.75 mV
Lead Channel Sensing Intrinsic Amplitude: 5.25 mV
Lead Channel Sensing Intrinsic Amplitude: 5.25 mV
Lead Channel Setting Pacing Amplitude: 2.5 V
Lead Channel Setting Pacing Amplitude: 2.5 V
Lead Channel Setting Pacing Pulse Width: 0.4 ms
Lead Channel Setting Pacing Pulse Width: 1 ms
Lead Channel Setting Sensing Sensitivity: 0.9 mV
Zone Setting Status: 755011
Zone Setting Status: 755011

## 2023-09-25 DIAGNOSIS — I509 Heart failure, unspecified: Secondary | ICD-10-CM | POA: Diagnosis not present

## 2023-09-25 DIAGNOSIS — I519 Heart disease, unspecified: Secondary | ICD-10-CM | POA: Diagnosis not present

## 2023-09-25 DIAGNOSIS — J449 Chronic obstructive pulmonary disease, unspecified: Secondary | ICD-10-CM | POA: Diagnosis not present

## 2023-09-25 DIAGNOSIS — E785 Hyperlipidemia, unspecified: Secondary | ICD-10-CM | POA: Diagnosis not present

## 2023-09-25 DIAGNOSIS — H3530 Unspecified macular degeneration: Secondary | ICD-10-CM | POA: Diagnosis not present

## 2023-09-25 DIAGNOSIS — Z950 Presence of cardiac pacemaker: Secondary | ICD-10-CM | POA: Diagnosis not present

## 2023-09-26 ENCOUNTER — Ambulatory Visit: Payer: Self-pay | Admitting: Cardiovascular Disease

## 2023-10-01 DIAGNOSIS — I519 Heart disease, unspecified: Secondary | ICD-10-CM | POA: Diagnosis not present

## 2023-10-01 DIAGNOSIS — Z7901 Long term (current) use of anticoagulants: Secondary | ICD-10-CM | POA: Diagnosis not present

## 2023-10-01 DIAGNOSIS — I480 Paroxysmal atrial fibrillation: Secondary | ICD-10-CM | POA: Diagnosis not present

## 2023-10-01 DIAGNOSIS — I509 Heart failure, unspecified: Secondary | ICD-10-CM | POA: Diagnosis not present

## 2023-10-01 DIAGNOSIS — N189 Chronic kidney disease, unspecified: Secondary | ICD-10-CM | POA: Diagnosis not present

## 2023-10-01 DIAGNOSIS — I10 Essential (primary) hypertension: Secondary | ICD-10-CM | POA: Diagnosis not present

## 2023-10-01 DIAGNOSIS — H3530 Unspecified macular degeneration: Secondary | ICD-10-CM | POA: Diagnosis not present

## 2023-10-01 DIAGNOSIS — E118 Type 2 diabetes mellitus with unspecified complications: Secondary | ICD-10-CM | POA: Diagnosis not present

## 2023-10-01 DIAGNOSIS — E785 Hyperlipidemia, unspecified: Secondary | ICD-10-CM | POA: Diagnosis not present

## 2023-10-01 DIAGNOSIS — Z8673 Personal history of transient ischemic attack (TIA), and cerebral infarction without residual deficits: Secondary | ICD-10-CM | POA: Diagnosis not present

## 2023-10-01 DIAGNOSIS — I714 Abdominal aortic aneurysm, without rupture, unspecified: Secondary | ICD-10-CM | POA: Diagnosis not present

## 2023-10-01 DIAGNOSIS — Z950 Presence of cardiac pacemaker: Secondary | ICD-10-CM | POA: Diagnosis not present

## 2023-10-01 DIAGNOSIS — J449 Chronic obstructive pulmonary disease, unspecified: Secondary | ICD-10-CM | POA: Diagnosis not present

## 2023-10-02 DIAGNOSIS — I519 Heart disease, unspecified: Secondary | ICD-10-CM | POA: Diagnosis not present

## 2023-10-02 DIAGNOSIS — Z950 Presence of cardiac pacemaker: Secondary | ICD-10-CM | POA: Diagnosis not present

## 2023-10-02 DIAGNOSIS — E785 Hyperlipidemia, unspecified: Secondary | ICD-10-CM | POA: Diagnosis not present

## 2023-10-02 DIAGNOSIS — J449 Chronic obstructive pulmonary disease, unspecified: Secondary | ICD-10-CM | POA: Diagnosis not present

## 2023-10-02 DIAGNOSIS — H3530 Unspecified macular degeneration: Secondary | ICD-10-CM | POA: Diagnosis not present

## 2023-10-02 DIAGNOSIS — I509 Heart failure, unspecified: Secondary | ICD-10-CM | POA: Diagnosis not present

## 2023-10-08 DIAGNOSIS — I509 Heart failure, unspecified: Secondary | ICD-10-CM | POA: Diagnosis not present

## 2023-10-08 DIAGNOSIS — J449 Chronic obstructive pulmonary disease, unspecified: Secondary | ICD-10-CM | POA: Diagnosis not present

## 2023-10-08 DIAGNOSIS — E785 Hyperlipidemia, unspecified: Secondary | ICD-10-CM | POA: Diagnosis not present

## 2023-10-08 DIAGNOSIS — I519 Heart disease, unspecified: Secondary | ICD-10-CM | POA: Diagnosis not present

## 2023-10-08 DIAGNOSIS — H3530 Unspecified macular degeneration: Secondary | ICD-10-CM | POA: Diagnosis not present

## 2023-10-08 DIAGNOSIS — Z950 Presence of cardiac pacemaker: Secondary | ICD-10-CM | POA: Diagnosis not present

## 2023-10-09 DIAGNOSIS — J449 Chronic obstructive pulmonary disease, unspecified: Secondary | ICD-10-CM | POA: Diagnosis not present

## 2023-10-09 DIAGNOSIS — E785 Hyperlipidemia, unspecified: Secondary | ICD-10-CM | POA: Diagnosis not present

## 2023-10-09 DIAGNOSIS — I519 Heart disease, unspecified: Secondary | ICD-10-CM | POA: Diagnosis not present

## 2023-10-09 DIAGNOSIS — Z950 Presence of cardiac pacemaker: Secondary | ICD-10-CM | POA: Diagnosis not present

## 2023-10-09 DIAGNOSIS — H3530 Unspecified macular degeneration: Secondary | ICD-10-CM | POA: Diagnosis not present

## 2023-10-09 DIAGNOSIS — I509 Heart failure, unspecified: Secondary | ICD-10-CM | POA: Diagnosis not present

## 2023-10-11 DIAGNOSIS — E785 Hyperlipidemia, unspecified: Secondary | ICD-10-CM | POA: Diagnosis not present

## 2023-10-11 DIAGNOSIS — I509 Heart failure, unspecified: Secondary | ICD-10-CM | POA: Diagnosis not present

## 2023-10-11 DIAGNOSIS — J449 Chronic obstructive pulmonary disease, unspecified: Secondary | ICD-10-CM | POA: Diagnosis not present

## 2023-10-11 DIAGNOSIS — Z950 Presence of cardiac pacemaker: Secondary | ICD-10-CM | POA: Diagnosis not present

## 2023-10-11 DIAGNOSIS — I519 Heart disease, unspecified: Secondary | ICD-10-CM | POA: Diagnosis not present

## 2023-10-11 DIAGNOSIS — H3530 Unspecified macular degeneration: Secondary | ICD-10-CM | POA: Diagnosis not present

## 2023-10-16 DIAGNOSIS — Z950 Presence of cardiac pacemaker: Secondary | ICD-10-CM | POA: Diagnosis not present

## 2023-10-16 DIAGNOSIS — H3530 Unspecified macular degeneration: Secondary | ICD-10-CM | POA: Diagnosis not present

## 2023-10-16 DIAGNOSIS — I519 Heart disease, unspecified: Secondary | ICD-10-CM | POA: Diagnosis not present

## 2023-10-16 DIAGNOSIS — I509 Heart failure, unspecified: Secondary | ICD-10-CM | POA: Diagnosis not present

## 2023-10-16 DIAGNOSIS — E785 Hyperlipidemia, unspecified: Secondary | ICD-10-CM | POA: Diagnosis not present

## 2023-10-16 DIAGNOSIS — J449 Chronic obstructive pulmonary disease, unspecified: Secondary | ICD-10-CM | POA: Diagnosis not present

## 2023-10-22 ENCOUNTER — Ambulatory Visit: Attending: Cardiovascular Disease | Admitting: Cardiovascular Disease

## 2023-10-22 ENCOUNTER — Encounter: Payer: Self-pay | Admitting: Cardiovascular Disease

## 2023-10-22 VITALS — BP 104/60 | HR 84 | Ht 74.0 in | Wt 174.0 lb

## 2023-10-22 DIAGNOSIS — I519 Heart disease, unspecified: Secondary | ICD-10-CM | POA: Diagnosis not present

## 2023-10-22 DIAGNOSIS — I4729 Other ventricular tachycardia: Secondary | ICD-10-CM | POA: Diagnosis not present

## 2023-10-22 DIAGNOSIS — I509 Heart failure, unspecified: Secondary | ICD-10-CM | POA: Diagnosis not present

## 2023-10-22 DIAGNOSIS — I2510 Atherosclerotic heart disease of native coronary artery without angina pectoris: Secondary | ICD-10-CM | POA: Insufficient documentation

## 2023-10-22 DIAGNOSIS — I4819 Other persistent atrial fibrillation: Secondary | ICD-10-CM | POA: Insufficient documentation

## 2023-10-22 DIAGNOSIS — I7143 Infrarenal abdominal aortic aneurysm, without rupture: Secondary | ICD-10-CM | POA: Insufficient documentation

## 2023-10-22 DIAGNOSIS — Z950 Presence of cardiac pacemaker: Secondary | ICD-10-CM | POA: Diagnosis not present

## 2023-10-22 DIAGNOSIS — I5020 Unspecified systolic (congestive) heart failure: Secondary | ICD-10-CM | POA: Diagnosis not present

## 2023-10-22 DIAGNOSIS — I059 Rheumatic mitral valve disease, unspecified: Secondary | ICD-10-CM | POA: Diagnosis not present

## 2023-10-22 DIAGNOSIS — E785 Hyperlipidemia, unspecified: Secondary | ICD-10-CM | POA: Diagnosis not present

## 2023-10-22 DIAGNOSIS — H3530 Unspecified macular degeneration: Secondary | ICD-10-CM | POA: Diagnosis not present

## 2023-10-22 DIAGNOSIS — J449 Chronic obstructive pulmonary disease, unspecified: Secondary | ICD-10-CM | POA: Diagnosis not present

## 2023-10-22 NOTE — Assessment & Plan Note (Signed)
 Stable, continue beta-blockade  [place] is doing remarkably well.  I am going to check his CBC and metabolic panel today.  I will plan to see him back in 6 months for follow-up.

## 2023-10-22 NOTE — Progress Notes (Signed)
 Cardiology Office Note:    Date:  10/22/2023   ID:  [name], DOB 10-Oct-1931, MRN [id number]  PCP:  Valentin Skates, DO   [facility] Providers Cardiologist:  Ozell Fell, MD Electrophysiologist:  Elspeth Sage, MD     Referring MD: Valentin Skates, DO   Chief Complaint  Patient presents with   Coronary Artery Disease    History of Present Illness:    [name] is a 89 y.o. male with a hx of:  Coronary artery disease  S/p CABG Cath 12/18: oLAD 50, pLAD 50, oD1 50; oLCx 60, OM2 95; RCA irregs; R Radial-OM2 and L-LAD patent >> Med rx Mitral valve disease S/p MV repair at time of CABG Persistent atrial fibrillation Dofetilide  Rx, Apixaban  Rx  Digoxin  Rx  (HFrEF) heart failure with reduced ejection fraction  TTE 10/2018: EF 35-40 TTE 05/06/20: EF 35-40, global HK, mild LVH, mildly reduced RVSF, RVSP 45.5 (mod elevated pulmonary artery pressure), severe BAE, trivial MR, mild MS (mean 3 mmHg), mild to mod TR, mild AV calcification, dilated Ao root (40 mm), mild dilation of ascending aorta (37 mm)  TTE 03/07/22: EF 20-25, GLS -4.3, severely reduced RVSF, mod pulm hypertension, RVSF 45.2, severe BAE, mild MR, mild MS, mean MV 4 mmHg, severe TR, RAP 15 TTE 08/14/22: EF 20-25, global HK, mod LVH, severely reduced RVSF, mild pul hypertension, severe BAE, mild MR, mild MS, severe TR, trivial AI, mod PI, RAP 15 Pulmonary hypertension          Echocardiogram 4/22: RVSP 45.5  Ventricular tachycardia  S/p ICD  S/p ? to CRT-P in 03/2022 Carotid artery disease S/p R CEA  US  08/29/19: Bilat ICA 1-39 Abdominal aortic aneurysm  CT 07/2022: 6.7 cm (followed by vasc surg)  Hypertension  Diabetes mellitus  Chronic kidney disease  Chronic Obstructive Pulmonary Disease  Hx of CVA         He is here with his daughter today. He's lost 30# in the past year, but has a reasonably good appetite and eats 3 meals/day. He is now wearing oxygen  during the day.  He has had no chest  pain or pressure.  He denies any recurrent leg swelling.  There have been some questions about his lab work and whether he needs to remain on magnesium .  Current Medications: Current Meds  Medication Sig   atorvastatin  (LIPITOR ) 80 MG tablet TAKE ONE TABLET BY MOUTH EVERY DAY FOR CHOLESTEROL   Blood Glucose Monitoring Suppl DEVI Use to check blood sugar three times a day. May substitute to any manufacturer covered by patient's insurance. Dx:E11.65   Continuous Glucose Sensor (DEXCOM G7 SENSOR) MISC 1 Device by Does not apply route continuous. 1 sensor every 10 days as prescribed   digoxin  (LANOXIN ) 0.125 MG tablet Take 1 tablet (0.125 mg total) by mouth daily.   ELIQUIS  5 MG TABS tablet TAKE ONE TABLET BY MOUTH TWICE DAILY   empagliflozin  (JARDIANCE ) 25 MG TABS tablet Take 1 tablet (25 mg total) by mouth daily before breakfast.   EPIPEN 2-PAK 0.3 MG/0.3ML SOAJ injection Inject 0.3 mg into the muscle daily as needed for anaphylaxis.    Erenumab -aooe (AIMOVIG ) 140 MG/ML SOAJ Inject 140 mg into the skin every 28 (twenty-eight) days.   fexofenadine-pseudoephedrine (ALLEGRA-D) 60-120 MG 12 hr tablet Take 1 tablet by mouth daily.   ibuprofen  (ADVIL ) 200 MG tablet Take 400 mg by mouth every 8 (eight) hours as needed (headaches/migraine).   Magnesium  250 MG TABS Take 250 mg by  mouth in the morning.   metoprolol  succinate (TOPROL -XL) 25 MG 24 hr tablet TAKE ONE TABLET BY MOUTH TWICE DAILY WITH OR immediately following A MEAL   nateglinide  (STARLIX ) 120 MG tablet Take 1 tablet (120 mg total) by mouth 3 (three) times daily with meals.   potassium chloride  (KLOR-CON ) 10 MEQ tablet Take 1 tablet (10 mEq total) by mouth daily.   torsemide  (DEMADEX ) 20 MG tablet Take 1 tablet (20 mg total) by mouth daily.     Allergies:   Bee venom, Latex, Metformin, Rivaroxaban , Sotalol, Levofloxacin, and Warfarin sodium   ROS:   Please see the history of present illness.    All other systems reviewed and are  negative.  EKGs/Labs/Other Studies Reviewed:    The following studies were reviewed today: Cardiac Studies & Procedures   ______________________________________________________________________________________________ CARDIAC CATHETERIZATION  CARDIAC CATHETERIZATION 01/05/2017  Conclusion 1. Severe 2 vessel CAD with continued patency of the LIMA-LAD and right radial graft-OM 2. Moderate, severely calcified, proximal circumflex stenosis with negative FFR assessment 3. Normal right heart hemodynamics except for a prominent V wave in the PCWP tracing 4. Normal/low LVEDP  Recommend: ongoing medical therapy. Pt appears to be well-compensated from a hemodynamic perspective.  Findings Coronary Findings Diagnostic  Dominance: Right  Left Anterior Descending Ost LAD to Prox LAD lesion is 50% stenosed. The lesion is moderately calcified. Prox LAD lesion is 50% stenosed.  First Diagonal Branch Ost 1st Diag to 1st Diag lesion is 50% stenosed. The lesion is calcified.  Left Circumflex Ost Cx to Prox Cx lesion is 60% stenosed. The lesion is severely calcified. Eccentric calcified plaque in some views appears moderate-severe and in other views appears widely patent.  Second Obtuse Marginal Branch Ost 2nd Mrg lesion is 95% stenosed.  Right Coronary Artery Vessel is small. The vessel exhibits minimal luminal irregularities.  Right Radial Artery Graft To 2nd Mrg Right radial artery. Widely patent  LIMA LIMA Graft To Mid LAD LIMA graft was visualized by angiography and is normal in caliber. The graft exhibits no disease. Widely patent, imaged non-selectively  Intervention  No interventions have been documented.     ECHOCARDIOGRAM  ECHOCARDIOGRAM COMPLETE 08/14/2022  Narrative ECHOCARDIOGRAM REPORT    Patient Name:   [name] [facility] Date of Exam: 08/14/2022 Medical Rec #:  [id number]        Height:       73.0 in Accession #:    [id number]       Weight:       188.0 lb Date of  Birth:  December 26, 1931        BSA:          2.096 m Patient Age:    89 years         BP:           106/76 mmHg Patient Gender: M                HR:           90 bpm. Exam Location:  [address]  Procedure: 2D Echo, 3D Echo, Cardiac Doppler and Color Doppler  Indications:    I48.19 Atrial Fibrillation  History:        Patient has prior history of Echocardiogram examinations, most recent 03/07/2022. CHF, CAD, ICD, Pulmonary HTN, Arrythmias:ventricular Tachycardia; Risk Factors:Hypertension and Diabetes.  Sonographer:    Waldo Guadalajara RCS Referring Phys: [phone] Girard Koontz  IMPRESSIONS   1. Left ventricular ejection fraction, by estimation, is 20 to 25%. The left ventricle  has severely decreased function. The left ventricle demonstrates global hypokinesis. The left ventricular internal cavity size was moderately dilated. There is moderate concentric left ventricular hypertrophy. Left ventricular diastolic parameters are indeterminate. 2. Right ventricular systolic function is severely reduced. The right ventricular size is moderately enlarged. There is mildly elevated pulmonary artery systolic pressure. 3. Left atrial size was severely dilated. 4. Right atrial size was severely dilated. 5. The mitral valve is degenerative. Mild mitral valve regurgitation. Mild mitral stenosis. Moderate to severe mitral annular calcification. 6. Tricuspid valve regurgitation is severe. 7. The aortic valve is tricuspid. There is mild calcification of the aortic valve. Aortic valve regurgitation is trivial. No aortic stenosis is present. 8. Pulmonic valve regurgitation is moderate. 9. The inferior vena cava is dilated in size with <50% respiratory variability, suggesting right atrial pressure of 15 mmHg.  FINDINGS Left Ventricle: Left ventricular ejection fraction, by estimation, is 20 to 25%. The left ventricle has severely decreased function. The left ventricle demonstrates global hypokinesis. The left  ventricular internal cavity size was moderately dilated. There is moderate concentric left ventricular hypertrophy. Left ventricular diastolic parameters are indeterminate.  Right Ventricle: The right ventricular size is moderately enlarged. No increase in right ventricular wall thickness. Right ventricular systolic function is severely reduced. There is mildly elevated pulmonary artery systolic pressure. The tricuspid regurgitant velocity is 2.91 m/s, and with an assumed right atrial pressure of 3 mmHg, the estimated right ventricular systolic pressure is 36.9 mmHg.  Left Atrium: Left atrial size was severely dilated.  Right Atrium: Right atrial size was severely dilated.  Pericardium: There is no evidence of pericardial effusion.  Mitral Valve: The mitral valve is degenerative in appearance. Moderate to severe mitral annular calcification. Mild mitral valve regurgitation. Mild mitral valve stenosis. MV peak gradient, 11.6 mmHg. The mean mitral valve gradient is 3.8 mmHg.  Tricuspid Valve: The tricuspid valve is normal in structure. Tricuspid valve regurgitation is severe. No evidence of tricuspid stenosis.  Aortic Valve: The aortic valve is tricuspid. There is mild calcification of the aortic valve. Aortic valve regurgitation is trivial. Aortic regurgitation PHT measures 473 msec. No aortic stenosis is present.  Pulmonic Valve: The pulmonic valve was normal in structure. Pulmonic valve regurgitation is moderate. No evidence of pulmonic stenosis.  Aorta: The aortic root is normal in size and structure.  Venous: The inferior vena cava is dilated in size with less than 50% respiratory variability, suggesting right atrial pressure of 15 mmHg.  IAS/Shunts: No atrial level shunt detected by color flow Doppler.  Additional Comments: A device lead is visualized.   LEFT VENTRICLE PLAX 2D LVIDd:         6.30 cm   Diastology LVIDs:         5.80 cm   LV e' medial:    6.85 cm/s LV PW:          1.10 cm   LV E/e' medial:  25.1 LV IVS:        1.10 cm   LV e' lateral:   2.93 cm/s LVOT diam:     2.00 cm   LV E/e' lateral: 58.7 LV SV:         40 LV SV Index:   19 LVOT Area:     3.14 cm  3D Volume EF: 3D EF:        19 % LV EDV:       247 ml LV ESV:       201 ml LV SV:  46 ml  RIGHT VENTRICLE RV Basal diam:  4.60 cm RV Mid diam:    4.80 cm RV S prime:     4.81 cm/s TAPSE (M-mode): 1.2 cm RVSP:           36.9 mmHg  LEFT ATRIUM              Index        RIGHT ATRIUM           Index LA diam:        6.30 cm  3.01 cm/m   RA Pressure: 3.00 mmHg LA Vol (A2C):   161.0 ml 76.82 ml/m  RA Area:     30.40 cm LA Vol (A4C):   149.0 ml 71.09 ml/m  RA Volume:   113.00 ml 53.92 ml/m LA Biplane Vol: 162.0 ml 77.30 ml/m AORTIC VALVE LVOT Vmax:   71.10 cm/s LVOT Vmean:  45.133 cm/s LVOT VTI:    0.129 m AI PHT:      473 msec  AORTA Ao Root diam: 3.50 cm Ao Asc diam:  3.50 cm  MITRAL VALVE                TRICUSPID VALVE MV Area (PHT): 3.09 cm     TR Peak grad:   33.9 mmHg MV Area VTI:   1.01 cm     TR Vmax:        291.00 cm/s MV Peak grad:  11.6 mmHg    Estimated RAP:  3.00 mmHg MV Mean grad:  3.8 mmHg     RVSP:           36.9 mmHg MV Vmax:       1.70 m/s MV Vmean:      83.4 cm/s    SHUNTS MV Decel Time: 246 msec     Systemic VTI:  0.13 m MV E velocity: 172.00 cm/s  Systemic Diam: 2.00 cm  Toribio Fuel MD Electronically signed by Toribio Fuel MD Signature Date/Time: 08/14/2022/2:04:41 PM    Final          ______________________________________________________________________________________________      EKG:   EKG Interpretation Date/Time:  Monday October 22 2023 14:06:51 EDT Ventricular Rate:  84 PR Interval:    QRS Duration:  174 QT Interval:  420 QTC Calculation: 496 R Axis:   -63  Text Interpretation: Atrial fibrillation with premature ventricular or aberrantly conducted complexes Left axis deviation Non-specific intra-ventricular  conduction block Left ventricular hypertrophy with repolarization abnormality ( R in aVL , Cornell product , Romhilt-Estes ) When compared with ECG of 21-Nov-2022 14:31, Atrial fibrillation has replaced Electronic ventricular pacemaker Confirmed by Wonda Sharper [id number]) on 10/22/2023 2:26:49 PM    Recent Labs: 11/10/2022: NT-Pro BNP 6,091 12/25/2022: ALT 28 04/24/2023: BUN 18; Creatinine, Ser 1.04; Hemoglobin 16.5; Platelets 136; Potassium 4.8; Sodium 136  Recent Lipid Panel    Component Value Date/Time   CHOL 110 11/05/2018 0256   TRIG 90 11/05/2018 0256   HDL 48 11/05/2018 0256   CHOLHDL 2.3 11/05/2018 0256   VLDL 18 11/05/2018 0256   LDLCALC 44 11/05/2018 0256     Risk Assessment/Calculations:    CHA2DS2-VASc Score = 4   This indicates a 4.8% annual risk of stroke. The patient's score is based upon: CHF History: 1 HTN History: 0 Diabetes History: 0 Stroke History: 0 Vascular Disease History: 1 Age Score: 2 Gender Score: 0               Physical Exam:  VS:  BP 104/60   Pulse 84   Ht 6' 2 (1.88 m)   Wt 174 lb (78.9 kg)   SpO2 96%   BMI 22.34 kg/m     Wt Readings from Last 3 Encounters:  10/22/23 174 lb (78.9 kg)  07/30/23 182 lb (82.6 kg)  04/24/23 184 lb 9.6 oz (83.7 kg)     GEN:  Well nourished, well developed in no acute distress HEENT: Normal NECK: No JVD; No carotid bruits LYMPHATICS: No lymphadenopathy CARDIAC: RRR, no murmurs, rubs, gallops RESPIRATORY:  Clear to auscultation without rales, wheezing or rhonchi  ABDOMEN: Soft, non-tender, non-distended MUSCULOSKELETAL:  No edema; No deformity  SKIN: Warm and dry NEUROLOGIC:  Alert and oriented x 3 PSYCHIATRIC:  Normal affect   Assessment & Plan HFrEF (heart failure with reduced ejection fraction) (HCC) The patient has chronic HFrEF with severely reduced LVEF.  He appears well compensated with no evidence of volume overload.  He will continue on digoxin , Jardiance , metoprolol  succinate, and  torsemide .  No changes are made today.  Will check his metabolic panel to make sure that his potassium and renal function is in line. Mitral valve disease Mild mitral regurgitation with moderate to severe mitral annular calcification noted.  Continue medical management. Persistent atrial fibrillation (HCC) Tolerating oral anticoagulation with apixaban .  No changes made.  Patient's status post permanent pacemaker. Infrarenal abdominal aortic aneurysm (AAA) without rupture [facility]) Patient with a large infrarenal abdominal aortic aneurysm.  Not a candidate for repair, patient in hospice. Coronary artery disease involving native coronary artery of native heart without angina pectoris Stable with no anginal symptoms.  Continue medical management NSVT (nonsustained ventricular tachycardia) (HCC) Stable, continue beta-blockade  [place] is doing remarkably well.  I am going to check his CBC and metabolic panel today.  I will plan to see him back in 6 months for follow-up.            Medication Adjustments/Labs and Tests Ordered: Current medicines are reviewed at length with the patient today.  Concerns regarding medicines are outlined above.  Orders Placed This Encounter  Procedures   CBC   Comprehensive metabolic panel with GFR   EKG [id number]   No orders of the defined types were placed in this encounter.   Patient Instructions  Medication Instructions:  No medication changes were made at this visit. Continue current regimen.   *If you need a refill on your cardiac medications before your next appointment, please call your pharmacy*  Lab Work: To be completed today: CBC and CMP  If you have labs (blood work) drawn today and your tests are completely normal, you will receive your results only by: MyChart Message (if you have MyChart) OR A paper copy in the mail If you have any lab test that is abnormal or we need to change your treatment, we will call you to review the  results.  Testing/Procedures: None ordered today.  Follow-Up: At [facility], you and your health needs are our priority.  As part of our continuing mission to provide you with exceptional heart care, our providers are all part of one team.  This team includes your primary Cardiologist (physician) and Advanced Practice Providers or APPs (Physician Assistants and Nurse Practitioners) who all work together to provide you with the care you need, when you need it.  Your next appointment:   6 month(s)  Provider:   Ozell Fell, MD      Signed, Ozell Fell, MD  10/22/2023 5:06 PM  [facility]

## 2023-10-22 NOTE — Assessment & Plan Note (Signed)
 The patient has chronic HFrEF with severely reduced LVEF.  He appears well compensated with no evidence of volume overload.  He will continue on digoxin , Jardiance , metoprolol  succinate, and torsemide .  No changes are made today.  Will check his metabolic panel to make sure that his potassium and renal function is in line.

## 2023-10-22 NOTE — Patient Instructions (Signed)
 Medication Instructions:  No medication changes were made at this visit. Continue current regimen.   *If you need a refill on your cardiac medications before your next appointment, please call your pharmacy*  Lab Work: To be completed today: CBC and CMP  If you have labs (blood work) drawn today and your tests are completely normal, you will receive your results only by: MyChart Message (if you have MyChart) OR A paper copy in the mail If you have any lab test that is abnormal or we need to change your treatment, we will call you to review the results.  Testing/Procedures: None ordered today.  Follow-Up: At [facility], you and your health needs are our priority.  As part of our continuing mission to provide you with exceptional heart care, our providers are all part of one team.  This team includes your primary Cardiologist (physician) and Advanced Practice Providers or APPs (Physician Assistants and Nurse Practitioners) who all work together to provide you with the care you need, when you need it.  Your next appointment:   6 month(s)  Provider:   Ozell Fell, MD

## 2023-10-22 NOTE — Assessment & Plan Note (Signed)
 Mild mitral regurgitation with moderate to severe mitral annular calcification noted.  Continue medical management.

## 2023-10-22 NOTE — Assessment & Plan Note (Signed)
 Stable with no anginal symptoms.  Continue medical management

## 2023-10-22 NOTE — Assessment & Plan Note (Signed)
 Patient with a large infrarenal abdominal aortic aneurysm.  Not a candidate for repair, patient in hospice.

## 2023-10-22 NOTE — Assessment & Plan Note (Signed)
 Tolerating oral anticoagulation with apixaban .  No changes made.  Patient's status post permanent pacemaker.

## 2023-10-23 ENCOUNTER — Ambulatory Visit: Payer: Self-pay | Admitting: Cardiovascular Disease

## 2023-10-23 LAB — COMPREHENSIVE METABOLIC PANEL WITH GFR
ALT: 28 IU/L (ref 0–44)
AST: 36 IU/L (ref 0–40)
Albumin: 4.4 g/dL (ref 3.6–4.6)
Alkaline Phosphatase: 210 IU/L — AB (ref 48–129)
BUN/Creatinine Ratio: 22 (ref 10–24)
BUN: 28 mg/dL (ref 10–36)
Bilirubin Total: 1.5 mg/dL — AB (ref 0.0–1.2)
CO2: 24 mmol/L (ref 20–29)
Calcium: 9.4 mg/dL (ref 8.6–10.2)
Chloride: 89 mmol/L — ABNORMAL LOW (ref 96–106)
Creatinine, Ser: 1.26 mg/dL (ref 0.76–1.27)
Globulin, Total: 2.7 g/dL (ref 1.5–4.5)
Glucose: 453 mg/dL — AB (ref 70–99)
Potassium: 4.5 mmol/L (ref 3.5–5.2)
Sodium: 132 mmol/L — ABNORMAL LOW (ref 134–144)
Total Protein: 7.1 g/dL (ref 6.0–8.5)
eGFR: 54 mL/min/1.73 — AB (ref >59)

## 2023-10-23 LAB — CBC
Hematocrit: 44.6 % (ref 37.5–51.0)
Hemoglobin: 15.1 g/dL (ref 13.0–17.7)
MCH: 34.9 pg — ABNORMAL HIGH (ref 26.6–33.0)
MCHC: 33.9 g/dL (ref 31.5–35.7)
MCV: 103 fL — ABNORMAL HIGH (ref 79–97)
Platelets: 137 x10E3/uL — ABNORMAL LOW (ref 150–450)
RBC: 4.33 x10E6/uL (ref 4.14–5.80)
RDW: 12.5 % (ref 11.6–15.4)
WBC: 7.9 x10E3/uL (ref 3.4–10.8)

## 2023-10-24 DIAGNOSIS — I509 Heart failure, unspecified: Secondary | ICD-10-CM | POA: Diagnosis not present

## 2023-10-24 DIAGNOSIS — I519 Heart disease, unspecified: Secondary | ICD-10-CM | POA: Diagnosis not present

## 2023-10-24 DIAGNOSIS — Z950 Presence of cardiac pacemaker: Secondary | ICD-10-CM | POA: Diagnosis not present

## 2023-10-24 DIAGNOSIS — E785 Hyperlipidemia, unspecified: Secondary | ICD-10-CM | POA: Diagnosis not present

## 2023-10-24 DIAGNOSIS — J449 Chronic obstructive pulmonary disease, unspecified: Secondary | ICD-10-CM | POA: Diagnosis not present

## 2023-10-24 DIAGNOSIS — H3530 Unspecified macular degeneration: Secondary | ICD-10-CM | POA: Diagnosis not present

## 2023-10-24 NOTE — Progress Notes (Signed)
 Remote PPM Transmission

## 2023-10-30 DIAGNOSIS — E785 Hyperlipidemia, unspecified: Secondary | ICD-10-CM | POA: Diagnosis not present

## 2023-10-30 DIAGNOSIS — J449 Chronic obstructive pulmonary disease, unspecified: Secondary | ICD-10-CM | POA: Diagnosis not present

## 2023-10-30 DIAGNOSIS — Z950 Presence of cardiac pacemaker: Secondary | ICD-10-CM | POA: Diagnosis not present

## 2023-10-30 DIAGNOSIS — I509 Heart failure, unspecified: Secondary | ICD-10-CM | POA: Diagnosis not present

## 2023-10-30 DIAGNOSIS — I519 Heart disease, unspecified: Secondary | ICD-10-CM | POA: Diagnosis not present

## 2023-10-30 DIAGNOSIS — H3530 Unspecified macular degeneration: Secondary | ICD-10-CM | POA: Diagnosis not present

## 2023-10-31 DIAGNOSIS — I10 Essential (primary) hypertension: Secondary | ICD-10-CM | POA: Diagnosis not present

## 2023-10-31 DIAGNOSIS — E785 Hyperlipidemia, unspecified: Secondary | ICD-10-CM | POA: Diagnosis not present

## 2023-10-31 DIAGNOSIS — I519 Heart disease, unspecified: Secondary | ICD-10-CM | POA: Diagnosis not present

## 2023-10-31 DIAGNOSIS — I509 Heart failure, unspecified: Secondary | ICD-10-CM | POA: Diagnosis not present

## 2023-10-31 DIAGNOSIS — Z7901 Long term (current) use of anticoagulants: Secondary | ICD-10-CM | POA: Diagnosis not present

## 2023-10-31 DIAGNOSIS — N189 Chronic kidney disease, unspecified: Secondary | ICD-10-CM | POA: Diagnosis not present

## 2023-10-31 DIAGNOSIS — Z8673 Personal history of transient ischemic attack (TIA), and cerebral infarction without residual deficits: Secondary | ICD-10-CM | POA: Diagnosis not present

## 2023-10-31 DIAGNOSIS — Z950 Presence of cardiac pacemaker: Secondary | ICD-10-CM | POA: Diagnosis not present

## 2023-10-31 DIAGNOSIS — I480 Paroxysmal atrial fibrillation: Secondary | ICD-10-CM | POA: Diagnosis not present

## 2023-10-31 DIAGNOSIS — E118 Type 2 diabetes mellitus with unspecified complications: Secondary | ICD-10-CM | POA: Diagnosis not present

## 2023-10-31 DIAGNOSIS — H3530 Unspecified macular degeneration: Secondary | ICD-10-CM | POA: Diagnosis not present

## 2023-10-31 DIAGNOSIS — I714 Abdominal aortic aneurysm, without rupture, unspecified: Secondary | ICD-10-CM | POA: Diagnosis not present

## 2023-10-31 DIAGNOSIS — J449 Chronic obstructive pulmonary disease, unspecified: Secondary | ICD-10-CM | POA: Diagnosis not present

## 2023-11-05 ENCOUNTER — Ambulatory Visit: Admitting: "Endocrinology

## 2023-11-07 DIAGNOSIS — E785 Hyperlipidemia, unspecified: Secondary | ICD-10-CM | POA: Diagnosis not present

## 2023-11-07 DIAGNOSIS — I519 Heart disease, unspecified: Secondary | ICD-10-CM | POA: Diagnosis not present

## 2023-11-07 DIAGNOSIS — Z950 Presence of cardiac pacemaker: Secondary | ICD-10-CM | POA: Diagnosis not present

## 2023-11-07 DIAGNOSIS — I509 Heart failure, unspecified: Secondary | ICD-10-CM | POA: Diagnosis not present

## 2023-11-07 DIAGNOSIS — J449 Chronic obstructive pulmonary disease, unspecified: Secondary | ICD-10-CM | POA: Diagnosis not present

## 2023-11-07 DIAGNOSIS — H3530 Unspecified macular degeneration: Secondary | ICD-10-CM | POA: Diagnosis not present

## 2023-11-08 DIAGNOSIS — J449 Chronic obstructive pulmonary disease, unspecified: Secondary | ICD-10-CM | POA: Diagnosis not present

## 2023-11-08 DIAGNOSIS — H3530 Unspecified macular degeneration: Secondary | ICD-10-CM | POA: Diagnosis not present

## 2023-11-08 DIAGNOSIS — I509 Heart failure, unspecified: Secondary | ICD-10-CM | POA: Diagnosis not present

## 2023-11-08 DIAGNOSIS — I519 Heart disease, unspecified: Secondary | ICD-10-CM | POA: Diagnosis not present

## 2023-11-08 DIAGNOSIS — Z950 Presence of cardiac pacemaker: Secondary | ICD-10-CM | POA: Diagnosis not present

## 2023-11-08 DIAGNOSIS — E785 Hyperlipidemia, unspecified: Secondary | ICD-10-CM | POA: Diagnosis not present

## 2023-11-13 ENCOUNTER — Ambulatory Visit: Attending: Cardiovascular Disease | Admitting: Cardiovascular Disease

## 2023-11-13 ENCOUNTER — Encounter: Payer: Self-pay | Admitting: Cardiovascular Disease

## 2023-11-13 VITALS — BP 126/77 | HR 82 | Ht 74.0 in | Wt 169.0 lb

## 2023-11-13 DIAGNOSIS — I4819 Other persistent atrial fibrillation: Secondary | ICD-10-CM | POA: Diagnosis not present

## 2023-11-13 DIAGNOSIS — I255 Ischemic cardiomyopathy: Secondary | ICD-10-CM

## 2023-11-13 DIAGNOSIS — I442 Atrioventricular block, complete: Secondary | ICD-10-CM

## 2023-11-13 DIAGNOSIS — I4729 Other ventricular tachycardia: Secondary | ICD-10-CM | POA: Insufficient documentation

## 2023-11-13 LAB — CUP PACEART INCLINIC DEVICE CHECK
Date Time Interrogation Session: 20251014103409
Implantable Lead Connection Status: 753985
Implantable Lead Connection Status: 753985
Implantable Lead Connection Status: 753985
Implantable Lead Implant Date: 20040309
Implantable Lead Implant Date: 20040309
Implantable Lead Implant Date: 20240221
Implantable Lead Location: 753858
Implantable Lead Location: 753859
Implantable Lead Location: 753860
Implantable Lead Model: 158
Implantable Lead Model: 158
Implantable Lead Model: 5076
Implantable Lead Serial Number: 115061
Implantable Pulse Generator Implant Date: 20240221

## 2023-11-13 NOTE — Progress Notes (Signed)
  Electrophysiology Office Note:    Date:  11/13/2023   ID:  [name], DOB 25-Aug-1931, MRN [id number]  PCP:  Valentin Skates, DO   [facility] Providers Cardiologist:  Ozell Fell, MD Electrophysiologist:  Elspeth Sage, MD     Referring MD: Valentin Skates, DO   History of Present Illness:    [name] is a 89 y.o. male with a medical history significant for VT, persistent atrial fibrillation, CHFrEF, coronary artery disease s/p CABG, mitral valve disease s/p MVR,  referred for device and arrhythmia management.       History of Present Illness  He has a complicated past cardiac history.  From an EP's perspective, he has had a dual-chamber ICD in the past that was downgraded to a CRT P with capping of the RV ICD pins and placement of an LV lead due to CHF and high burden of RV patient.  He has also had PVCs in the past sometimes interfering with CRT pacing   He reports that since the placement of the new device, he has been feeling much better.  He feels slightly thirsty than he used to and and his increase his fluid intake.  He noticed that his weight, however, has been trending down.     Today, he reports that he feels well and has no acute complaints  EKGs/Labs/Other Studies Reviewed Today:     Echocardiogram:  TTE July 2024 LVEF 20 to 25%.    EKG:   EKG Interpretation Date/Time:  Tuesday November 13 2023 10:10:42 EDT Ventricular Rate:  85 PR Interval:    QRS Duration:  168 QT Interval:  418 QTC Calculation: 497 R Axis:   -82  Text Interpretation: VENTRICULAR PACING Premature ventricular complexes Left axis deviation When compared with ECG of 22-Oct-2023 14:06, No significant change was found Confirmed by Nancey Scotts [phone]) on 11/13/2023 10:16:52 AM     Physical Exam:    VS:  BP 126/77 (BP Location: Right Arm, Patient Position: Sitting, Cuff Size: Normal)   Pulse 82   Ht 6' 2 (1.88 m)   Wt 169 lb (76.7 kg)   SpO2 96%   BMI  21.70 kg/m     Wt Readings from Last 3 Encounters:  11/13/23 169 lb (76.7 kg)  10/22/23 174 lb (78.9 kg)  07/30/23 182 lb (82.6 kg)     GEN: Well nourished, well developed in no acute distress CARDIAC: RRR, no murmurs, rubs, gallops The device site is normal -- no tenderness, edema, drainage, redness, threatened erosion.  RESPIRATORY:  Normal work of breathing MUSCULOSKELETAL:  edema    ASSESSMENT & PLAN:     Medtronic CRT-P I reviewed today's device interrogation.  See Paceart for details Device is programmed VVIR ICD pins of the DF1 are capped  Longstanding persistent AF Severe biatrial dilation Rates are controlled  CHF with reduced EF LVEF 20 to 25%.   Signed, Scotts FORBES Nancey, MD  11/13/2023 10:29 AM    [facility]

## 2023-11-13 NOTE — Patient Instructions (Signed)
 Medication Instructions:  Your physician recommends that you continue on your current medications as directed. Please refer to the Current Medication list given to you today.  *If you need a refill on your cardiac medications before your next appointment, please call your pharmacy*  Lab Work: None ordered.  If you have labs (blood work) drawn today and your tests are completely normal, you will receive your results only by: MyChart Message (if you have MyChart) OR A paper copy in the mail If you have any lab test that is abnormal or we need to change your treatment, we will call you to review the results.  Testing/Procedures: None ordered.   Follow-Up: At [facility], you and your health needs are our priority.  As part of our continuing mission to provide you with exceptional heart care, our providers are all part of one team.  This team includes your primary Cardiologist (physician) and Advanced Practice Providers or APPs (Physician Assistants and Nurse Practitioners) who all work together to provide you with the care you need, when you need it.  Your next appointment:   12 month(s)  Provider:   You will see one of the following Advanced Practice Providers on your designated Care Team:   Charlies Arthur, [place] Ozell Jodie Passey, PA-C Suzann Riddle, NP Daphne Barrack, NP Artist Pouch, PA-C    We recommend signing up for the patient portal called MyChart.  Sign up information is provided on this After Visit Summary.  MyChart is used to connect with patients for Virtual Visits (Telemedicine).  Patients are able to view lab/test results, encounter notes, upcoming appointments, etc.  Non-urgent messages can be sent to your provider as well.   To learn more about what you can do with MyChart, go to [email or web address].

## 2023-11-14 DIAGNOSIS — I519 Heart disease, unspecified: Secondary | ICD-10-CM | POA: Diagnosis not present

## 2023-11-14 DIAGNOSIS — J449 Chronic obstructive pulmonary disease, unspecified: Secondary | ICD-10-CM | POA: Diagnosis not present

## 2023-11-14 DIAGNOSIS — E785 Hyperlipidemia, unspecified: Secondary | ICD-10-CM | POA: Diagnosis not present

## 2023-11-14 DIAGNOSIS — I509 Heart failure, unspecified: Secondary | ICD-10-CM | POA: Diagnosis not present

## 2023-11-14 DIAGNOSIS — Z950 Presence of cardiac pacemaker: Secondary | ICD-10-CM | POA: Diagnosis not present

## 2023-11-14 DIAGNOSIS — H3530 Unspecified macular degeneration: Secondary | ICD-10-CM | POA: Diagnosis not present

## 2023-11-20 DIAGNOSIS — I519 Heart disease, unspecified: Secondary | ICD-10-CM | POA: Diagnosis not present

## 2023-11-20 DIAGNOSIS — H3530 Unspecified macular degeneration: Secondary | ICD-10-CM | POA: Diagnosis not present

## 2023-11-20 DIAGNOSIS — I509 Heart failure, unspecified: Secondary | ICD-10-CM | POA: Diagnosis not present

## 2023-11-20 DIAGNOSIS — E785 Hyperlipidemia, unspecified: Secondary | ICD-10-CM | POA: Diagnosis not present

## 2023-11-20 DIAGNOSIS — J449 Chronic obstructive pulmonary disease, unspecified: Secondary | ICD-10-CM | POA: Diagnosis not present

## 2023-11-20 DIAGNOSIS — Z950 Presence of cardiac pacemaker: Secondary | ICD-10-CM | POA: Diagnosis not present

## 2023-11-23 ENCOUNTER — Encounter: Admitting: Cardiovascular Disease

## 2023-11-27 ENCOUNTER — Other Ambulatory Visit: Payer: Self-pay

## 2023-11-27 ENCOUNTER — Encounter (HOSPITAL_BASED_OUTPATIENT_CLINIC_OR_DEPARTMENT_OTHER): Payer: Self-pay | Admitting: Internal Medicine

## 2023-11-27 ENCOUNTER — Observation Stay (HOSPITAL_BASED_OUTPATIENT_CLINIC_OR_DEPARTMENT_OTHER)
Admission: EM | Admit: 2023-11-27 | Discharge: 2023-11-28 | Disposition: A | Discharge status: Home / Self Care | Attending: Internal Medicine | Admitting: Internal Medicine

## 2023-11-27 ENCOUNTER — Emergency Department (HOSPITAL_BASED_OUTPATIENT_CLINIC_OR_DEPARTMENT_OTHER)

## 2023-11-27 DIAGNOSIS — K4090 Unilateral inguinal hernia, without obstruction or gangrene, not specified as recurrent: Secondary | ICD-10-CM

## 2023-11-27 DIAGNOSIS — J439 Emphysema, unspecified: Secondary | ICD-10-CM | POA: Diagnosis not present

## 2023-11-27 DIAGNOSIS — I714 Abdominal aortic aneurysm, without rupture, unspecified: Secondary | ICD-10-CM | POA: Insufficient documentation

## 2023-11-27 DIAGNOSIS — I110 Hypertensive heart disease with heart failure: Secondary | ICD-10-CM | POA: Diagnosis not present

## 2023-11-27 DIAGNOSIS — E785 Hyperlipidemia, unspecified: Secondary | ICD-10-CM | POA: Diagnosis not present

## 2023-11-27 DIAGNOSIS — K56609 Unspecified intestinal obstruction, unspecified as to partial versus complete obstruction: Secondary | ICD-10-CM | POA: Diagnosis not present

## 2023-11-27 DIAGNOSIS — I5022 Chronic systolic (congestive) heart failure: Secondary | ICD-10-CM | POA: Insufficient documentation

## 2023-11-27 DIAGNOSIS — E119 Type 2 diabetes mellitus without complications: Secondary | ICD-10-CM | POA: Insufficient documentation

## 2023-11-27 DIAGNOSIS — J449 Chronic obstructive pulmonary disease, unspecified: Secondary | ICD-10-CM | POA: Insufficient documentation

## 2023-11-27 DIAGNOSIS — F1090 Alcohol use, unspecified, uncomplicated: Secondary | ICD-10-CM | POA: Insufficient documentation

## 2023-11-27 DIAGNOSIS — Z515 Encounter for palliative care: Secondary | ICD-10-CM

## 2023-11-27 DIAGNOSIS — I519 Heart disease, unspecified: Secondary | ICD-10-CM | POA: Diagnosis not present

## 2023-11-27 DIAGNOSIS — K449 Diaphragmatic hernia without obstruction or gangrene: Principal | ICD-10-CM | POA: Insufficient documentation

## 2023-11-27 DIAGNOSIS — I4819 Other persistent atrial fibrillation: Secondary | ICD-10-CM | POA: Diagnosis not present

## 2023-11-27 DIAGNOSIS — I700 Atherosclerosis of aorta: Secondary | ICD-10-CM | POA: Diagnosis not present

## 2023-11-27 DIAGNOSIS — Z8679 Personal history of other diseases of the circulatory system: Secondary | ICD-10-CM

## 2023-11-27 DIAGNOSIS — R109 Unspecified abdominal pain: Secondary | ICD-10-CM | POA: Diagnosis present

## 2023-11-27 DIAGNOSIS — Z95810 Presence of automatic (implantable) cardiac defibrillator: Secondary | ICD-10-CM | POA: Diagnosis present

## 2023-11-27 DIAGNOSIS — E118 Type 2 diabetes mellitus with unspecified complications: Secondary | ICD-10-CM | POA: Diagnosis present

## 2023-11-27 DIAGNOSIS — Z95818 Presence of other cardiac implants and grafts: Secondary | ICD-10-CM | POA: Diagnosis not present

## 2023-11-27 DIAGNOSIS — R609 Edema, unspecified: Secondary | ICD-10-CM | POA: Diagnosis not present

## 2023-11-27 DIAGNOSIS — H3530 Unspecified macular degeneration: Secondary | ICD-10-CM | POA: Diagnosis not present

## 2023-11-27 DIAGNOSIS — K4020 Bilateral inguinal hernia, without obstruction or gangrene, not specified as recurrent: Secondary | ICD-10-CM | POA: Diagnosis not present

## 2023-11-27 DIAGNOSIS — I509 Heart failure, unspecified: Secondary | ICD-10-CM | POA: Diagnosis not present

## 2023-11-27 DIAGNOSIS — I5020 Unspecified systolic (congestive) heart failure: Secondary | ICD-10-CM | POA: Diagnosis present

## 2023-11-27 DIAGNOSIS — Z794 Long term (current) use of insulin: Secondary | ICD-10-CM | POA: Diagnosis not present

## 2023-11-27 DIAGNOSIS — K4031 Unilateral inguinal hernia, with obstruction, without gangrene, recurrent: Principal | ICD-10-CM

## 2023-11-27 DIAGNOSIS — Z950 Presence of cardiac pacemaker: Secondary | ICD-10-CM | POA: Diagnosis not present

## 2023-11-27 DIAGNOSIS — I7143 Infrarenal abdominal aortic aneurysm, without rupture: Secondary | ICD-10-CM | POA: Diagnosis not present

## 2023-11-27 LAB — CBC
HCT: 45.2 % (ref 39.0–52.0)
Hemoglobin: 15.9 g/dL (ref 13.0–17.0)
MCH: 34.3 pg — ABNORMAL HIGH (ref 26.0–34.0)
MCHC: 35.2 g/dL (ref 30.0–36.0)
MCV: 97.6 fL (ref 80.0–100.0)
Platelets: 132 K/uL — ABNORMAL LOW (ref 150–400)
RBC: 4.63 MIL/uL (ref 4.22–5.81)
RDW: 12.7 % (ref 11.5–15.5)
WBC: 7.7 K/uL (ref 4.0–10.5)
nRBC: 0 % (ref 0.0–0.2)

## 2023-11-27 LAB — COMPREHENSIVE METABOLIC PANEL WITH GFR
ALT: 31 U/L (ref 0–44)
AST: 33 U/L (ref 15–41)
Albumin: 4.4 g/dL (ref 3.5–5.0)
Alkaline Phosphatase: 174 U/L — ABNORMAL HIGH (ref 38–126)
Anion gap: 16 — ABNORMAL HIGH (ref 5–15)
BUN: 28 mg/dL — ABNORMAL HIGH (ref 8–23)
CO2: 28 mmol/L (ref 22–32)
Calcium: 10 mg/dL (ref 8.9–10.3)
Chloride: 89 mmol/L — ABNORMAL LOW (ref 98–111)
Creatinine, Ser: 1.06 mg/dL (ref 0.61–1.24)
GFR, Estimated: >60 mL/min (ref >60)
Glucose, Bld: 355 mg/dL — ABNORMAL HIGH (ref 70–99)
Potassium: 4.1 mmol/L (ref 3.5–5.1)
Sodium: 132 mmol/L — ABNORMAL LOW (ref 135–145)
Total Bilirubin: 2 mg/dL — ABNORMAL HIGH (ref 0.0–1.2)
Total Protein: 8.3 g/dL — ABNORMAL HIGH (ref 6.5–8.1)

## 2023-11-27 LAB — LACTIC ACID, PLASMA
Lactic Acid, Venous: 2.3 mmol/L (ref 0.5–1.9)
Lactic Acid, Venous: 1 mmol/L (ref 0.5–1.9)

## 2023-11-27 LAB — LIPASE, BLOOD: Lipase: 24 U/L (ref 11–51)

## 2023-11-27 MED ORDER — IOHEXOL 350 MG/ML SOLN
100.0000 mL | Freq: Once | INTRAVENOUS | Status: AC | PRN
  Administered 2023-11-27: 100 mL via INTRAVENOUS

## 2023-11-27 MED ORDER — SODIUM CHLORIDE 0.9 % IV BOLUS
250.0000 mL | Freq: Once | INTRAVENOUS | Status: AC
  Administered 2023-11-27: 250 mL via INTRAVENOUS

## 2023-11-27 MED ORDER — FENTANYL CITRATE (PF) 50 MCG/ML IJ SOSY
50.0000 ug | PREFILLED_SYRINGE | Freq: Once | INTRAMUSCULAR | Status: DC
  Filled 2023-11-27: qty 1

## 2023-11-27 MED ORDER — DIATRIZOATE MEGLUMINE & SODIUM 66-10 % PO SOLN
90.0000 mL | Freq: Once | ORAL | Status: DC
  Filled 2023-11-27: qty 90

## 2023-11-27 NOTE — ED Provider Notes (Signed)
 [facility]ERG[facility] DEPARTMENT AT [facility] Provider Note   CSN: [id number] Arrival date & time: 11/27/23  1607     Patient presents with: Abdominal Pain   [name] is a 89 y.o. male.    Abdominal Pain    89 year old male with medical history significant for CAD, CHF (LVEF 20 to 25%), AAA, COPD, DM 2, pancreatitis, ischemic cardiomyopathy with a Medtronic pacemaker in place, inguinal hernia presenting to the emergency department with a chief complaint of abdominal pain.  The patient is on home hospice.  I was called by his home hospice nurse and informed that he was presenting to the emergency department due to ongoing abdominal pain.  He has had bulging in his right inguinal area that he has been unable to reduce and has been having severe abdominal pain not amenable to opiate pain medication.  He is presenting for evaluation.   He endorses nausea as well as right sided abdominal pain that has been present all day.  He intermittently gets pain in his abdomen that usually subsides.  Pain has been persistent all day.  He took 5 mg of morphine  in addition to 3 mg given by a hospice nurse.  Prior to Admission medications   Medication Sig Start Date End Date Taking? Authorizing Provider  atorvastatin  (LIPITOR ) 80 MG tablet TAKE ONE TABLET BY MOUTH EVERY DAY FOR CHOLESTEROL 06/01/21   Fernande Elspeth BROCKS, MD  Continuous Glucose Sensor (DEXCOM G7 SENSOR) MISC 1 Device by Does not apply route continuous. 1 sensor every 10 days as prescribed 12/27/22   Dartha Ernst, MD  digoxin  (LANOXIN ) 0.125 MG tablet Take 1 tablet (0.125 mg total) by mouth daily. 08/13/23   Wonda Sharper, MD  ELIQUIS  5 MG TABS tablet TAKE ONE TABLET BY MOUTH TWICE DAILY 03/18/21   Cooper, Michael, MD  empagliflozin  (JARDIANCE ) 25 MG TABS tablet Take 1 tablet (25 mg total) by mouth daily before breakfast. 04/23/23   Motwani, Ernst, MD  EPIPEN 2-PAK 0.3 MG/0.3ML SOAJ injection Inject 0.3 mg into the muscle daily as  needed for anaphylaxis.  08/12/12   [provider]  Erenumab -aooe (AIMOVIG ) 140 MG/ML SOAJ Inject 140 mg into the skin every 28 (twenty-eight) days. 12/04/22   Skeet Juliene JONELLE, DO  ibuprofen  (ADVIL ) 200 MG tablet Take 400 mg by mouth every 8 (eight) hours as needed (headaches/migraine).    [provider]  Magnesium  250 MG TABS Take 250 mg by mouth in the morning.    [provider]  metoprolol  succinate (TOPROL -XL) 25 MG 24 hr tablet TAKE ONE TABLET BY MOUTH TWICE DAILY WITH OR immediately following A MEAL 09/12/23   Wonda Sharper, MD  nateglinide  (STARLIX ) 120 MG tablet Take 1 tablet (120 mg total) by mouth 3 (three) times daily with meals. 09/10/23 11/13/23  Dartha Ernst, MD  potassium chloride  (KLOR-CON ) 10 MEQ tablet Take 1 tablet (10 mEq total) by mouth daily. 12/01/22   Wonda Sharper, MD  torsemide  (DEMADEX ) 20 MG tablet Take 1 tablet (20 mg total) by mouth daily. 12/01/22   Wonda Sharper, MD    Allergies: Bee venom, Latex, Metformin, Rivaroxaban , Sotalol, Levofloxacin, and Warfarin sodium    Review of Systems  Gastrointestinal:  Positive for abdominal pain.  All other systems reviewed and are negative.   Updated Vital Signs BP 114/77   Pulse 89   Temp 97.7 F (36.5 C) (Oral)   Resp 14   SpO2 98%   Physical Exam Vitals and nursing note reviewed.  Constitutional:  General: He is not in acute distress.    Appearance: He is well-developed.  HENT:     Head: Normocephalic and atraumatic.  Eyes:     Conjunctiva/sclera: Conjunctivae normal.  Cardiovascular:     Rate and Rhythm: Normal rate and regular rhythm.     Heart sounds: No murmur heard. Pulmonary:     Effort: Pulmonary effort is normal. No respiratory distress.     Breath sounds: Normal breath sounds.  Abdominal:     Palpations: Abdomen is soft.     Tenderness: There is abdominal tenderness in the right lower quadrant.     Comments: Large right-sided inguinal hernia present, tender to  palpation  Musculoskeletal:        General: No swelling.     Cervical back: Neck supple.  Skin:    General: Skin is warm and dry.     Capillary Refill: Capillary refill takes less than 2 seconds.  Neurological:     Mental Status: He is alert.  Psychiatric:        Mood and Affect: Mood normal.     (all labs ordered are listed, but only abnormal results are displayed) Labs Reviewed  COMPREHENSIVE METABOLIC PANEL WITH GFR - Abnormal; Notable for the following components:      Result Value   Sodium 132 (*)    Chloride 89 (*)    Glucose, Bld 355 (*)    BUN 28 (*)    Total Protein 8.3 (*)    Alkaline Phosphatase 174 (*)    Total Bilirubin 2.0 (*)    Anion gap 16 (*)    All other components within normal limits  CBC - Abnormal; Notable for the following components:   MCH 34.3 (*)    Platelets 132 (*)    All other components within normal limits  LACTIC ACID, PLASMA - Abnormal; Notable for the following components:   Lactic Acid, Venous 2.3 (*)    All other components within normal limits  LIPASE, BLOOD  LACTIC ACID, PLASMA    EKG: None  Radiology: CT Angio Chest/Abd/Pel for Dissection W and/or Wo Contrast Result Date: 11/27/2023 CLINICAL DATA:  Provided history: Abd pain, hx of AAA and hernia Hospice patient. EXAM: CT ANGIOGRAPHY CHEST, ABDOMEN AND PELVIS TECHNIQUE: Non-contrast CT of the chest was initially obtained. Multidetector CT imaging through the chest, abdomen and pelvis was performed using the standard protocol during bolus administration of intravenous contrast. Multiplanar reconstructed images and MIPs were obtained and reviewed to evaluate the vascular anatomy. RADIATION DOSE REDUCTION: This exam was performed according to the departmental dose-optimization program which includes automated exposure control, adjustment of the mA and/or kV according to patient size and/or use of iterative reconstruction technique. CONTRAST:  OMNIPAQUE  IOHEXOL  350 MG/ML SOLN  COMPARISON:  Abdominopelvic CTA 08/11/2022 FINDINGS: CTA CHEST FINDINGS Cardiovascular: No aortic hematoma on noncontrast exam. Thoracic aortic atherosclerosis and tortuosity. No dissection or acute aortic findings. Dilated central pulmonary arteries. The heart is enlarged. Post CABG. There are native coronary artery calcifications. Left-sided pacemaker in place. No pericardial effusion. Contrast refluxes into the right internal jugular vein. Mediastinum/Nodes: No suspicious lymphadenopathy. Unremarkable appearance of the esophagus. Lungs/Pleura: Mild emphysema. No confluent opacity, pleural effusion, pulmonary edema or pneumothorax. No pulmonary mass. Musculoskeletal: Median sternotomy. Scoliosis and degenerative change in the spine. There are no acute or suspicious osseous abnormalities. Review of the MIP images confirms the above findings. CTA ABDOMEN AND PELVIS FINDINGS VASCULAR Aorta: Infrarenal abdominal aortic aneurysm has increased from prior exam currently 7  x 6.6 cm, previously 6.8 x 6.6 cm. Proximal extent proximally 4.5 cm below the renal arteries. Distal extent extends to the iliac bifurcation. Extensive mural thrombus is again seen. High-density material in the periphery of the mural thrombus is unchanged from prior. There is no periaortic stranding or inflammation. Aortic atherosclerosis. Celiac: Patent without evidence of aneurysm, dissection, vasculitis or significant stenosis. Moderate splenic artery atherosclerosis. SMA: Patent without evidence of aneurysm, dissection, vasculitis or significant stenosis. Renals: Both renal arteries are patent without evidence of aneurysm, dissection, vasculitis, fibromuscular dysplasia or significant stenosis. IMA: Grossly patent. Inflow: Patent without evidence of aneurysm, dissection, vasculitis or significant stenosis. Extensive calcified plaque and tortuosity. Veins: No obvious venous abnormality within the limitations of this arterial phase study. No portal  venous or mesenteric gas. Review of the MIP images confirms the above findings. NON-VASCULAR Hepatobiliary: Cholecystectomy. Unremarkable arterial phase evaluation of the liver. Pancreas: No ductal dilatation or inflammation. Spleen: Unremarkable arterial phase evaluation. Adrenals/Urinary Tract: No adrenal nodule. No hydronephrosis or renal inflammation. Left renal cyst. No further follow-up imaging is recommended. Prominent bladder distension without wall thickening. Stomach/Bowel: Right inguinal hernia contains a loop of small bowel. There is fecalization of small bowel contents within the hernia and adjacent mesenteric edema. Small amount of fluid in the hernia sac. The small bowel proximal to this is dilated and fluid-filled. The stomach is distended with air-fluid level. No bowel pneumatosis. Colonic diverticulosis without focal diverticulitis. Lymphatic: No bulky lymphadenopathy. Reproductive: Prostate is unremarkable. Other: Trace free fluid in the pericolic gutters and pelvis. No free air. In addition to the small bowel containing right inguinal hernia there is a small fat containing left inguinal hernia. Musculoskeletal: Remote injury to the right iliac wing. Scoliosis and degenerative change in the spine. Chronic compression fracture of L4 and L5, without progression. Review of the MIP images confirms the above findings. IMPRESSION: 1. Small-bowel obstruction secondary to right inguinal hernia. The hernia contains a loop of small bowel with fecalization of small bowel contents and adjacent mesenteric edema. Small amount of fluid in the hernia sac. 2. Infrarenal abdominal aortic aneurysm has increased in size from prior exam currently 7 x 6.6 cm, previously 6.8 x 6.6 cm. No periaortic stranding or inflammation. Consensus guidelines recommend referral to a vascular specialist, giving consideration for patient's clinical status. 3. Moderate bladder distension. 4. No aortic dissection or acute aortic  findings. 5. Dilated central pulmonary arteries, suggesting pulmonary arterial hypertension. 6. Colonic diverticulosis without focal diverticulitis. Aortic Atherosclerosis (ICD10-I70.0) and Emphysema (ICD10-J43.9). Electronically Signed   By: Andrea Gasman M.D.   On: 11/27/2023 19:46     Hernia reduction  Date/Time: 11/27/2023 5:00 PM  Performed by: Jerrol Agent, MD Authorized by: Jerrol Agent, MD  Consent: The procedure was performed in an emergent situation Consent given by: patient Required items: required blood products, implants, devices, and special equipment available Patient identity confirmed: arm band Time out: Immediately prior to procedure a time out was called to verify the correct patient, procedure, equipment, support staff and site/side marked as required. Preparation: Patient was prepped and draped in the usual sterile fashion. Local anesthesia used: no  Anesthesia: Local anesthesia used: no  Sedation: Patient sedated: no  Patient tolerance: patient tolerated the procedure well with no immediate complications Comments: Large right inguinal hernia successfully reduced   Hernia reduction  Date/Time: 11/27/2023 8:54 PM  Performed by: Jerrol Agent, MD Authorized by: Jerrol Agent, MD  Consent: Verbal consent obtained Risks and benefits: risks, benefits and alternatives were discussed Consent  given by: patient Required items: required blood products, implants, devices, and special equipment available Patient identity confirmed: arm band Patient tolerance: patient tolerated the procedure well with no immediate complications Comments: Again, successful reduction of a right sided inguinal hernia      Medications Ordered in the ED  fentaNYL  (SUBLIMAZE ) injection 50 mcg (50 mcg Intravenous Patient Refused/Not Given 11/27/23 1741)  diatrizoate meglumine-sodium (GASTROGRAFIN) 66-10 % solution 90 mL (0 mLs Per NG tube Hold 11/27/23 2111)  sodium chloride   0.9 % bolus 250 mL (0 mLs Intravenous Stopped 11/27/23 1758)  iohexol  (OMNIPAQUE ) 350 MG/ML injection 100 mL (100 mLs Intravenous Contrast Given 11/27/23 1916)                                    Medical Decision Making Amount and/or Complexity of Data Reviewed Labs: ordered. Radiology: ordered.  Risk Prescription drug management. Decision regarding hospitalization.    89 year old male with medical history significant for CAD, CHF (LVEF 20 to 25%), AAA, COPD, DM 2, pancreatitis, ischemic cardiomyopathy with a Medtronic pacemaker in place, inguinal hernia presenting to the emergency department with a chief complaint of abdominal pain.  The patient is on home hospice.  I was called by his home hospice nurse and informed that he was presenting to the emergency department due to ongoing abdominal pain.  He has had bulging in his right inguinal area that he has been unable to reduce and has been having severe abdominal pain not amenable to opiate pain medication.  He is presenting for evaluation.    He endorses nausea as well as right sided abdominal pain that has been present all day.  He intermittently gets pain in his abdomen that usually subsides.  Pain has been persistent all day.  He took 5 mg of morphine  in addition to 3 mg given by a hospice nurse.  On arrival, the patient was afebrile, not tachycardic or tachypneic, hemodynamically stable, saturating well on room air.  On exam the patient was found to have a large bulging right-sided inguinal hernia which was subsequently reduced bedside.  Patient had improvement in pain control following this.  Will obtain labs and CT imaging to further evaluate.  Labs: CMP with mild hyponatremia to 132, hyperglycemia to 355 without an anion gap acidosis with a bicarbonate of 28, anion gap of 16, lactic acidosis noted initially to 2.3, repeat lactic acid improved to 1.0, lipase normal, LFTs normal, CBC without a leukocytosis or anemia.  CTA dissection  study obtained given patient history of AAA: IMPRESSION:  1. Small-bowel obstruction secondary to right inguinal hernia. The  hernia contains a loop of small bowel with fecalization of small  bowel contents and adjacent mesenteric edema. Small amount of fluid  in the hernia sac.  2. Infrarenal abdominal aortic aneurysm has increased in size from  prior exam currently 7 x 6.6 cm, previously 6.8 x 6.6 cm. No  periaortic stranding or inflammation. Consensus guidelines recommend  referral to a vascular specialist, giving consideration for  patient's clinical status.  3. Moderate bladder distension.  4. No aortic dissection or acute aortic findings.  5. Dilated central pulmonary arteries, suggesting pulmonary arterial  hypertension.  6. Colonic diverticulosis without focal diverticulitis.    Aortic Atherosclerosis (ICD10-I70.0) and Emphysema (ICD10-J43.9).   Of note, the patient's hernia was successfully reduced bedside again status post CT imaging.   Hospice On-call: Spoke with the patient's on-call hospice line at  [phone] and updated them regarding the workup and confirmed that admission in this acute setting would not jeopardize his hospice placement.   Consulted general surgery, Dr. Polly who recommended that the patient drink oral contrast and obtain x-ray of the abdomen in 8 hours per the small bowel protocol.  Can hold on NG tube placement at this time given patient improvement in symptoms and given that the patient is not actively vomiting.  Medicine consulted for admission for observation in the setting of small bowel obstruction.    Informed by staff that gastrograffin not available at [facility]. Will need to perform study inpatient. Hospitalist medicine consulted for admission, Dr. Alfornia accepting.      Final diagnoses:  Recurrent unilateral inguinal hernia with obstruction and without gangrene  SBO (small bowel obstruction) (HCC)  Small bowel obstruction [facility])     ED Discharge Orders     None          Jerrol Agent, MD 11/27/23 2225

## 2023-11-27 NOTE — Plan of Care (Signed)
 Plan of Care Note for accepted transfer   Patient name: [name] FMW:[id number] DOB: [birth year]  Facility requesting transfer: [facility] Requesting Provider: Dr. Jerrol Reason for transfer: SBO Facility course: 89 year old male with history of CAD, HFrEF (EF 20 to 25%), persistent A-fib, polymorphic ventricular tachycardia, status post AICD, AAA, COPD, type 2 diabetes, pancreatitis, inguinal hernia presenting with right sided abdominal pain.  He is on home hospice.  Vital signs stable.  No leukocytosis, sodium 132, glucose 355, bicarb 28, creatinine 1.0, T. bili 2.0, alk phos 174, transaminases normal, lipase normal, lactic acid 2.3> 1.0.  CTA chest/abdomen/pelvis showing: IMPRESSION: 1. Small-bowel obstruction secondary to right inguinal hernia. The hernia contains a loop of small bowel with fecalization of small bowel contents and adjacent mesenteric edema. Small amount of fluid in the hernia sac. 2. Infrarenal abdominal aortic aneurysm has increased in size from prior exam currently 7 x 6.6 cm, previously 6.8 x 6.6 cm. No periaortic stranding or inflammation. Consensus guidelines recommend referral to a vascular specialist, giving consideration for patient's clinical status. 3. Moderate bladder distension. 4. No aortic dissection or acute aortic findings. 5. Dilated central pulmonary arteries, suggesting pulmonary arterial hypertension. 6. Colonic diverticulosis without focal diverticulitis.   Aortic Atherosclerosis (ICD10-I70.0) and Emphysema (ICD10-J43.9).  Patient underwent bedside reduction of his inguinal hernia twice in the ED with subsequent improvement of pain.  ED physician consulted general surgeon Dr. Polly who recommended that the patient drink oral contrast and obtain x-ray of the abdomen in 8 hours per small bowel protocol.  Recommended holding off NG tube placement at this time given improvement of patient's symptoms and the fact that he is not actively  vomiting.  Patient was given fentanyl  and 250 mL IV fluids.  Plan of care: The patient is accepted for admission to Progressive unit at [facility].  [facility] will assume care on arrival to accepting facility. Until arrival, care as per EDP. However, [facility] available 24/7 for questions and assistance.  Check [email or web address] for on-call coverage.  Nursing staff, please call [facility] Admits & Consults System-Wide number under Amion on patient's arrival so appropriate admitting provider can evaluate the pt.

## 2023-11-27 NOTE — ED Notes (Signed)
 No NG at this time per MD. Gastrografin unavailable at facility - MD notified and medication to be given when patient is transferred. Patient remains without nausea or vomiting.

## 2023-11-27 NOTE — ED Triage Notes (Addendum)
 DNR- Hospice pain. Abd pain-generalized- HX abdominal aneurysm. This abd pain has been intermittent months- usually subsides. Has not subsided today. Inguinal hernia hx as well.   Took 2.5mg  Morphine  + 2.5mg  1 hr after.  3mg  given by Hospice nurse.  CHF, diabetes, pacemaker-medtronic.

## 2023-11-28 ENCOUNTER — Encounter (HOSPITAL_COMMUNITY): Payer: Self-pay | Admitting: Internal Medicine

## 2023-11-28 DIAGNOSIS — I4819 Other persistent atrial fibrillation: Secondary | ICD-10-CM | POA: Diagnosis not present

## 2023-11-28 DIAGNOSIS — E118 Type 2 diabetes mellitus with unspecified complications: Secondary | ICD-10-CM

## 2023-11-28 DIAGNOSIS — R1084 Generalized abdominal pain: Secondary | ICD-10-CM | POA: Diagnosis not present

## 2023-11-28 DIAGNOSIS — Z515 Encounter for palliative care: Secondary | ICD-10-CM | POA: Diagnosis not present

## 2023-11-28 DIAGNOSIS — Z950 Presence of cardiac pacemaker: Secondary | ICD-10-CM | POA: Diagnosis not present

## 2023-11-28 DIAGNOSIS — K56609 Unspecified intestinal obstruction, unspecified as to partial versus complete obstruction: Secondary | ICD-10-CM | POA: Diagnosis present

## 2023-11-28 DIAGNOSIS — Z7401 Bed confinement status: Secondary | ICD-10-CM | POA: Diagnosis not present

## 2023-11-28 DIAGNOSIS — K4031 Unilateral inguinal hernia, with obstruction, without gangrene, recurrent: Secondary | ICD-10-CM | POA: Diagnosis present

## 2023-11-28 DIAGNOSIS — Z95810 Presence of automatic (implantable) cardiac defibrillator: Secondary | ICD-10-CM | POA: Diagnosis not present

## 2023-11-28 DIAGNOSIS — I7143 Infrarenal abdominal aortic aneurysm, without rupture: Secondary | ICD-10-CM

## 2023-11-28 DIAGNOSIS — Z8679 Personal history of other diseases of the circulatory system: Secondary | ICD-10-CM

## 2023-11-28 DIAGNOSIS — J449 Chronic obstructive pulmonary disease, unspecified: Secondary | ICD-10-CM | POA: Diagnosis not present

## 2023-11-28 DIAGNOSIS — I5020 Unspecified systolic (congestive) heart failure: Secondary | ICD-10-CM

## 2023-11-28 DIAGNOSIS — E785 Hyperlipidemia, unspecified: Secondary | ICD-10-CM | POA: Diagnosis not present

## 2023-11-28 DIAGNOSIS — I519 Heart disease, unspecified: Secondary | ICD-10-CM | POA: Diagnosis not present

## 2023-11-28 DIAGNOSIS — I509 Heart failure, unspecified: Secondary | ICD-10-CM | POA: Diagnosis not present

## 2023-11-28 DIAGNOSIS — H3530 Unspecified macular degeneration: Secondary | ICD-10-CM | POA: Diagnosis not present

## 2023-11-28 DIAGNOSIS — K4090 Unilateral inguinal hernia, without obstruction or gangrene, not specified as recurrent: Secondary | ICD-10-CM | POA: Diagnosis not present

## 2023-11-28 LAB — GLUCOSE, CAPILLARY
Glucose-Capillary: 177 mg/dL — ABNORMAL HIGH (ref 70–99)
Glucose-Capillary: 169 mg/dL — ABNORMAL HIGH (ref 70–99)

## 2023-11-28 MED ORDER — OXYCODONE-ACETAMINOPHEN 10-325 MG PO TABS: 0.5000 | ORAL_TABLET | Freq: Four times a day (QID) | ORAL | 0 refills | Status: AC | PRN

## 2023-11-28 MED ORDER — METOPROLOL SUCCINATE ER 25 MG PO TB24
25.0000 mg | ORAL_TABLET | Freq: Two times a day (BID) | ORAL | Status: DC
  Administered 2023-11-28: 25 mg via ORAL
  Filled 2023-11-28: qty 1

## 2023-11-28 MED ORDER — ACETAMINOPHEN 650 MG RE SUPP: 650.0000 mg | Freq: Four times a day (QID) | RECTAL | Status: DC | PRN

## 2023-11-28 MED ORDER — INSULIN ASPART 100 UNIT/ML IJ SOLN: 0.0000 [IU] | Freq: Every day | INTRAMUSCULAR | Status: DC

## 2023-11-28 MED ORDER — MAGNESIUM OXIDE -MG SUPPLEMENT 400 (240 MG) MG PO TABS
200.0000 mg | ORAL_TABLET | Freq: Every day | ORAL | Status: DC
  Administered 2023-11-28: 200 mg via ORAL
  Filled 2023-11-28: qty 1

## 2023-11-28 MED ORDER — DIGOXIN 125 MCG PO TABS
0.1250 mg | ORAL_TABLET | Freq: Every day | ORAL | Status: DC
Start: 2023-11-28 — End: 2023-11-28
  Administered 2023-11-28: 0.125 mg via ORAL
  Filled 2023-11-28: qty 1

## 2023-11-28 MED ORDER — EMPAGLIFLOZIN 25 MG PO TABS
25.0000 mg | ORAL_TABLET | Freq: Every day | ORAL | Status: DC
Start: 2023-11-28 — End: 2023-11-28
  Administered 2023-11-28: 25 mg via ORAL
  Filled 2023-11-28 (×2): qty 1

## 2023-11-28 MED ORDER — POTASSIUM CHLORIDE ER 10 MEQ PO TBCR
10.0000 meq | EXTENDED_RELEASE_TABLET | Freq: Every day | ORAL | Status: DC
  Administered 2023-11-28: 10 meq via ORAL
  Filled 2023-11-28 (×2): qty 1

## 2023-11-28 MED ORDER — APIXABAN 5 MG PO TABS
5.0000 mg | ORAL_TABLET | Freq: Two times a day (BID) | ORAL | Status: DC
  Administered 2023-11-28: 5 mg via ORAL
  Filled 2023-11-28: qty 1

## 2023-11-28 MED ORDER — ATORVASTATIN CALCIUM 80 MG PO TABS
80.0000 mg | ORAL_TABLET | Freq: Every day | ORAL | Status: DC
  Administered 2023-11-28: 80 mg via ORAL
  Filled 2023-11-28: qty 1

## 2023-11-28 MED ORDER — INSULIN ASPART 100 UNIT/ML IJ SOLN
0.0000 [IU] | Freq: Three times a day (TID) | INTRAMUSCULAR | Status: DC
  Administered 2023-11-28 (×2): 2 [IU] via SUBCUTANEOUS

## 2023-11-28 MED ORDER — MAGNESIUM 250 MG PO TABS: 250.0000 mg | ORAL_TABLET | Freq: Every morning | ORAL | Status: DC

## 2023-11-28 MED ORDER — FENTANYL CITRATE (PF) 50 MCG/ML IJ SOSY: 25.0000 ug | PREFILLED_SYRINGE | INTRAMUSCULAR | Status: DC | PRN

## 2023-11-28 MED ORDER — NATEGLINIDE 60 MG PO TABS
120.0000 mg | ORAL_TABLET | Freq: Three times a day (TID) | ORAL | Status: DC
  Administered 2023-11-28 (×2): 120 mg via ORAL
  Filled 2023-11-28 (×3): qty 2

## 2023-11-28 MED ORDER — ALBUTEROL SULFATE (2.5 MG/3ML) 0.083% IN NEBU: 2.5000 mg | INHALATION_SOLUTION | Freq: Four times a day (QID) | RESPIRATORY_TRACT | Status: DC | PRN

## 2023-11-28 MED ORDER — ACETAMINOPHEN 325 MG PO TABS: 650.0000 mg | ORAL_TABLET | Freq: Four times a day (QID) | ORAL | Status: DC | PRN

## 2023-11-28 MED ORDER — SODIUM CHLORIDE 0.9% FLUSH
3.0000 mL | Freq: Two times a day (BID) | INTRAVENOUS | Status: DC
  Administered 2023-11-28: 3 mL via INTRAVENOUS

## 2023-11-28 NOTE — Discharge Summary (Signed)
 Physician Discharge Summary   Patient: [name] MRN: [id number] DOB: 1931-03-21  Admit date:     11/27/2023  Discharge date: 11/28/23  Discharge Physician: [name]   PCP: System, Provider Not In   Recommendations at discharge:   Continue outpatient follow-up with hospice  Discharge Diagnoses: Active Problems:   Right inguinal hernia   HFrEF (heart failure with reduced ejection fraction) (HCC)   Persistent atrial fibrillation (HCC)   Automatic implantable cardioverter-defibrillator in situ   History of ventricular tachycardia   Type 2 diabetes mellitus with complication, without long-term current use of insulin  (HCC)   COPD (chronic obstructive pulmonary disease) (HCC)   Abdominal aortic aneurysm (AAA) without rupture   Hospice care patient  Principal Problem (Resolved):   Small bowel obstruction [facility])  Hospital Course: with medical history significant of heart failure with reduced EF, polymorphic ventricular tachycardia s/p ICD, persistent atrial fibrillation, DM type II, COPD, abdominal aneurysm, presents with abdominal pain. He is accompanied by his daughter.   Yesterday, he experienced severe abdominal pain rated as 7 to 8 out of 10. No vomiting occurred during this episode, and he had two bowel movements the day before the pain started. He has been able to pass gas since the pain resolved.   Prior to this event, he occasionally experienced abdominal pain, particularly after getting up in the morning, which would resolve with a couple of Advils within 30 minutes to an hour.   He is on hospice care at home for heart failure and in the aneurysm for which he uses oxygen  occasionally for shortness of breath. He has a truss to manage the hernia.   His daughter is concerned about the hernia recurring, especially since it protruded again after he got up to use the restroom last night.   Patient was seen at [facility] ER patient was noted to be afebrile with pulse  54-89, respirations 14-24, and all other vital signs maintained.  Labs obtained yesterday significant for platelets 132, lactic acid 2.3-> 1, sodium 132, chloride 89, glucose 355, BUN 28, creatinine 1.06, anion gap 16, alkaline phosphatase 174, and total bilirubin 2.  CT revealed a small bowel obstruction secondary to patient was given a bolus of 250 mL of IV fluids.  General surgery had been consulted and recommended placement of NG tube.  However hernia was able to be manually reduced in the ED with improvement in patient's pain.    Assessment and Plan: Small bowel obstruction secondary to right inguinal hernia Resolved.  Patient presented with severe abdominal pain.  CT imaging concerning for small bowel obstruction secondary to a right inguinal hernia.  General surgery had been consulted.  Hernia ultimately had been able to be reduced by the ED provider.  Patient makes note that he is not a surgical candidate.  General surgery had been formally consulted and confirmed patient was not a surgical candidate.  Patient had been given a prescription for oxycodone  to use as needed for pain and recommended to continue to Truss to try and decrease reoccurrence.    Heart failure with reduced ejection fraction Chronic.  Patient appears to be euvolemic on physical exam at this time.  Last echocardiogram noted EF to be 20 to 25% with indeterminate diastolic parameters, and severe tricuspid regurgitation.  Patient was continued on current medication regimen   History of ventricular tachycardia s/p ICD Persistent atrial fibrillation on chronic anticoagulation Patient currently paced. Continue Eliquis , digoxin , and metoprolol     Uncontrolled Diabetes mellitus type 2  with hyperglycemia, long-term use Initially on glucose elevated up to 355.  Last available hemoglobin A1c noted to be 8.1 when checked on 07/30/2023.  Continue oral medication regimen   COPD overlap syndrome Patient without wheezing noted on  physical exam, but decreased overall aeration.  Patient has oxygen  at home to use as needed.   AAA Patient has a 7 x 6.6 cm infrarenal abdominal aortic aneurysm which increased in size from 6.8 x 6.6 cm.  Followed by vascular surgery, but not felt to be a surgical candidate.   On hospice - Plan to discharge back to home hospice        Consultants: General Surgery Procedures performed: None Disposition: Home Diet recommendation:  Cardiac diet DISCHARGE MEDICATION: Allergies as of 11/28/2023       Reactions   Bee Venom Anaphylaxis   Latex Other (See Comments), Rash   REDNESS AT REACTION SITE.   Metformin Diarrhea   Rivaroxaban  Other (See Comments)   Headaches, blurred vision Other Reaction(s): Dizziness   Sotalol Other (See Comments)   BLUE TOES- cut his circulation off   Levofloxacin    long QT syndrome, so avoid   Warfarin Sodium Other (See Comments)   migraine headaches/vision impariment        Medication List     TAKE these medications    Aimovig  140 MG/ML Soaj Generic drug: Erenumab -aooe Inject 140 mg into the skin every 28 (twenty-eight) days.   atorvastatin  80 MG tablet Commonly known as: LIPITOR  TAKE ONE TABLET BY MOUTH EVERY DAY FOR CHOLESTEROL   Dexcom G7 Sensor Misc 1 Device by Does not apply route continuous. 1 sensor every 10 days as prescribed   digoxin  0.125 MG tablet Commonly known as: LANOXIN  Take 1 tablet (0.125 mg total) by mouth daily.   Eliquis  5 MG Tabs tablet Generic drug: apixaban  TAKE ONE TABLET BY MOUTH TWICE DAILY   empagliflozin  25 MG Tabs tablet Commonly known as: Jardiance  Take 1 tablet (25 mg total) by mouth daily before breakfast.   EpiPen 2-Pak 0.3 MG/0.3ML Soaj injection Generic drug: EPINEPHrine Inject 0.3 mg into the muscle daily as needed for anaphylaxis.   ibuprofen  200 MG tablet Commonly known as: ADVIL  Take 400 mg by mouth every 8 (eight) hours as needed (headaches/migraine).   Magnesium  250 MG  Tabs Take 250 mg by mouth in the morning.   metoprolol  succinate 25 MG 24 hr tablet Commonly known as: TOPROL -XL TAKE ONE TABLET BY MOUTH TWICE DAILY WITH OR immediately following A MEAL   nateglinide  120 MG tablet Commonly known as: STARLIX  Take 1 tablet (120 mg total) by mouth 3 (three) times daily with meals.   oxyCODONE -acetaminophen  10-325 MG tablet Commonly known as: Percocet Take 0.5-1 tablets by mouth every 6 (six) hours as needed for pain.   potassium chloride  10 MEQ tablet Commonly known as: KLOR-CON  Take 1 tablet (10 mEq total) by mouth daily.   torsemide  20 MG tablet Commonly known as: DEMADEX  Take 1 tablet (20 mg total) by mouth daily.        Discharge Exam: There were no vitals filed for this visit.    Constitutional: Elderly male currently in no acute distress Eyes: PERRL, lids and conjunctivae normal ENMT: Mucous membranes are moist.  Fair dentition. Neck: normal, supple, no masses, no JVD Respiratory: clear to auscultation bilaterally, no wheezing, no crackles. Normal respiratory effort.   Cardiovascular: Regular rate and rhythm.  No extremity edema.  Abdomen: no tenderness, no masses palpated.  Bowel sounds positive.  Musculoskeletal:  no clubbing / cyanosis. No joint deformity upper and lower extremities. Good ROM, no contractures. Normal muscle tone.  Skin: no rashes, lesions, ulcers. No induration Neurologic: CN 2-12 grossly intact.  Strength 5/5 in all 4.  Psychiatric: Normal judgment and insight. Alert and oriented x 3. Normal mood.    Condition at discharge: good  The results of significant diagnostics from this hospitalization (including imaging, microbiology, ancillary and laboratory) are listed below for reference.   Imaging Studies: CT Angio Chest/Abd/Pel for Dissection W and/or Wo Contrast Result Date: 11/27/2023 CLINICAL DATA:  Provided history: Abd pain, hx of AAA and hernia Hospice patient. EXAM: CT ANGIOGRAPHY CHEST, ABDOMEN AND  PELVIS TECHNIQUE: Non-contrast CT of the chest was initially obtained. Multidetector CT imaging through the chest, abdomen and pelvis was performed using the standard protocol during bolus administration of intravenous contrast. Multiplanar reconstructed images and MIPs were obtained and reviewed to evaluate the vascular anatomy. RADIATION DOSE REDUCTION: This exam was performed according to the departmental dose-optimization program which includes automated exposure control, adjustment of the mA and/or kV according to patient size and/or use of iterative reconstruction technique. CONTRAST:  OMNIPAQUE  IOHEXOL  350 MG/ML SOLN COMPARISON:  Abdominopelvic CTA 08/11/2022 FINDINGS: CTA CHEST FINDINGS Cardiovascular: No aortic hematoma on noncontrast exam. Thoracic aortic atherosclerosis and tortuosity. No dissection or acute aortic findings. Dilated central pulmonary arteries. The heart is enlarged. Post CABG. There are native coronary artery calcifications. Left-sided pacemaker in place. No pericardial effusion. Contrast refluxes into the right internal jugular vein. Mediastinum/Nodes: No suspicious lymphadenopathy. Unremarkable appearance of the esophagus. Lungs/Pleura: Mild emphysema. No confluent opacity, pleural effusion, pulmonary edema or pneumothorax. No pulmonary mass. Musculoskeletal: Median sternotomy. Scoliosis and degenerative change in the spine. There are no acute or suspicious osseous abnormalities. Review of the MIP images confirms the above findings. CTA ABDOMEN AND PELVIS FINDINGS VASCULAR Aorta: Infrarenal abdominal aortic aneurysm has increased from prior exam currently 7 x 6.6 cm, previously 6.8 x 6.6 cm. Proximal extent proximally 4.5 cm below the renal arteries. Distal extent extends to the iliac bifurcation. Extensive mural thrombus is again seen. High-density material in the periphery of the mural thrombus is unchanged from prior. There is no periaortic stranding or inflammation. Aortic  atherosclerosis. Celiac: Patent without evidence of aneurysm, dissection, vasculitis or significant stenosis. Moderate splenic artery atherosclerosis. SMA: Patent without evidence of aneurysm, dissection, vasculitis or significant stenosis. Renals: Both renal arteries are patent without evidence of aneurysm, dissection, vasculitis, fibromuscular dysplasia or significant stenosis. IMA: Grossly patent. Inflow: Patent without evidence of aneurysm, dissection, vasculitis or significant stenosis. Extensive calcified plaque and tortuosity. Veins: No obvious venous abnormality within the limitations of this arterial phase study. No portal venous or mesenteric gas. Review of the MIP images confirms the above findings. NON-VASCULAR Hepatobiliary: Cholecystectomy. Unremarkable arterial phase evaluation of the liver. Pancreas: No ductal dilatation or inflammation. Spleen: Unremarkable arterial phase evaluation. Adrenals/Urinary Tract: No adrenal nodule. No hydronephrosis or renal inflammation. Left renal cyst. No further follow-up imaging is recommended. Prominent bladder distension without wall thickening. Stomach/Bowel: Right inguinal hernia contains a loop of small bowel. There is fecalization of small bowel contents within the hernia and adjacent mesenteric edema. Small amount of fluid in the hernia sac. The small bowel proximal to this is dilated and fluid-filled. The stomach is distended with air-fluid level. No bowel pneumatosis. Colonic diverticulosis without focal diverticulitis. Lymphatic: No bulky lymphadenopathy. Reproductive: Prostate is unremarkable. Other: Trace free fluid in the pericolic gutters and pelvis. No free air. In addition to the small  bowel containing right inguinal hernia there is a small fat containing left inguinal hernia. Musculoskeletal: Remote injury to the right iliac wing. Scoliosis and degenerative change in the spine. Chronic compression fracture of L4 and L5, without progression. Review of  the MIP images confirms the above findings. IMPRESSION: 1. Small-bowel obstruction secondary to right inguinal hernia. The hernia contains a loop of small bowel with fecalization of small bowel contents and adjacent mesenteric edema. Small amount of fluid in the hernia sac. 2. Infrarenal abdominal aortic aneurysm has increased in size from prior exam currently 7 x 6.6 cm, previously 6.8 x 6.6 cm. No periaortic stranding or inflammation. Consensus guidelines recommend referral to a vascular specialist, giving consideration for patient's clinical status. 3. Moderate bladder distension. 4. No aortic dissection or acute aortic findings. 5. Dilated central pulmonary arteries, suggesting pulmonary arterial hypertension. 6. Colonic diverticulosis without focal diverticulitis. Aortic Atherosclerosis (ICD10-I70.0) and Emphysema (ICD10-J43.9). Electronically Signed   By: Andrea Gasman M.D.   On: 11/27/2023 19:46   CUP PACEART INCLINIC DEVICE CHECK Result Date: 11/13/2023 Normal in-clinic CRT-P check programmed VVIR. Presenting Rhythm: AF/BP 86 w/ iregg. R-R. Routine testing was performed. Thresholds, sensing, impedance trend and stable and no programming changes were required. HF diagnostics are stable. AT/AF burden 100%. BiV pacing 90.2% of the time. Estimated longevity 7.8 years. Pt enrolled in remote follow-up.Rozelle Banter, BSN, RN   Microbiology: Results for orders placed or performed during the hospital encounter of 07/19/20  SARS CORONAVIRUS 2 (TAT 6-24 HRS) Nasopharyngeal Nasopharyngeal Swab     Status: None   Collection Time: 07/19/20 12:12 PM   Specimen: Nasopharyngeal Swab  Result Value Ref Range Status   SARS Coronavirus 2 NEGATIVE NEGATIVE Final    Comment: (NOTE) SARS-CoV-2 target nucleic acids are NOT DETECTED.  The SARS-CoV-2 RNA is generally detectable in upper and lower respiratory specimens during the acute phase of infection. Negative results do not preclude SARS-CoV-2 infection, do  not rule out co-infections with other pathogens, and should not be used as the sole basis for treatment or other patient management decisions. Negative results must be combined with clinical observations, patient history, and epidemiological information. The expected result is Negative.  Fact Sheet for Patients: hairslick.no  Fact Sheet for Healthcare Providers: quierodirigir.com  This test is not yet approved or cleared by the [place]  FDA and  has been authorized for detection and/or diagnosis of SARS-CoV-2 by FDA under an Emergency Use Authorization (EUA). This EUA will remain  in effect (meaning this test can be used) for the duration of the COVID-19 declaration under Se ction 564(b)(1) of the Act, 21 U.S.C. section 360bbb-3(b)(1), unless the authorization is terminated or revoked sooner.  Performed at [facility], [address] [place], [place] [address]     Labs: CBC: Recent Labs  Lab 11/27/23 1615  WBC 7.7  HGB 15.9  HCT 45.2  MCV 97.6  PLT 132*   Basic Metabolic Panel: Recent Labs  Lab 11/27/23 1615  NA 132*  K 4.1  CL 89*  CO2 28  GLUCOSE 355*  BUN 28*  CREATININE 1.06  CALCIUM  10.0   Liver Function Tests: Recent Labs  Lab 11/27/23 1615  AST 33  ALT 31  ALKPHOS 174*  BILITOT 2.0*  PROT 8.3*  ALBUMIN 4.4   CBG: Recent Labs  Lab 11/28/23 1258 11/28/23 1552  GLUCAP 177* 169*    Discharge time spent: less than 30 minutes.  Signed: Maximino DELENA Sharps, MD [facility] 11/28/2023

## 2023-11-28 NOTE — Care Management CC44 (Signed)
 Condition Code 44 Documentation Completed  Patient Details  Name: JAMIAN ANDUJO MRN: [id number] Date of Birth: [birth year]   Condition Code 44 given:  Yes Patient signature on Condition Code 44 notice:  Yes Documentation of 2 MD's agreement:  Yes Code 44 added to claim:  Yes    Nena LITTIE Coffee, RN 11/28/2023, 6:26 PM

## 2023-11-28 NOTE — Care Management CC44 (Signed)
 Condition Code 44 Documentation Completed  Patient Details  Name: [name] MRN: [id number] Date of Birth: 1931/05/24   Condition Code 44 given:  Yes Patient signature on Condition Code 44 notice:  Yes Documentation of 2 MD's agreement:  Yes Code 44 added to claim:  Yes    Nena LITTIE Coffee, RN 11/28/2023, 6:28 PM

## 2023-11-28 NOTE — Progress Notes (Signed)
 Patient arrived to 4E, oriented to unit, and CCMD notified. BP (!) 122/90 (BP Location: Left Arm)   Pulse 86   Temp 98 F (36.7 C) (Oral)   Resp (!) 22   SpO2 99%

## 2023-11-28 NOTE — Consult Note (Signed)
 [name] [birth year]  [id number].    Requesting MD: Dr. Maximino Sharps Chief Complaint/Reason for Consult: [facility]  HPI: [name] is a 89 y.o. CAD s/p CABG, HFrEF (EF 20 to 25%), persistent A-fib on Eliquis , polymorphic ventricular tachycardia, status post AICD, mitral valve disease s/p MVR, AAA (7 x 6.6 cm - per notes, Not a candidate for repair, patient in hospice), COPD, type 2 diabetes, on hospice who presented to the ED with 2 days of abdominal pain. Patient found to have RIH that was causing severe pain. CT w/ SBO 2/2 RIH. EDP was able to reduce RIH after CT imaging. Patient reports his pain has resolved since reduction of the hernia. He is tolerating cld without n/v and is passing flatus. Last BM 2 days ago. He reports he has seen Dr. Lyndel in the past for this and tells me he was felt to high risk for surgery from a vascular and cardiology standpoint. He reports he is on hospice for his heart. He reports he does have a truss belt at home but has not been using this.   ROS: ROS As above, see hpi  Family History  Problem Relation Age of Onset   Heart disease Father    Emphysema Father    Stroke Mother    Cancer Sister        breast cancer   Heart disease Paternal Grandmother    Heart disease Paternal Grandfather    Diabetes Other        grandmother, aunt, uncle   Cancer Other        lung cancer, aunt    Past Medical History:  Diagnosis Date   Acute on chronic combined systolic (congestive) and diastolic (congestive) heart failure (HCC)    AICD (automatic cardioverter/defibrillator) present    [employer]n PPM/ICD   Carotid stenosis, right    s/p endarterectomy 11/07/18   Chronic combined systolic (congestive) and diastolic (congestive) heart failure (HCC)    Coronary artery disease    [facility]   Diabetes mellitus    Dysrhythmia    Endocarditis, valve unspecified    Mitral   Headache    hx migraines 6-7 x mo   History of  migraine headaches    History of seasonal allergies    ICD (implantable cardiac defibrillator) in place    Optic nerve hemorrhage, 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  We will monitor this closely and look for changes over the ensuing 6 months.  Repeat visit here in 6 months   PAF (paroxysmal atrial fibrillation) (HCC)    NO LAA occlusion at the time of surgery  [name] 2018   Pleural effusion    PVC's (premature ventricular contractions)    Ventricular tachycardia, polymorphic (HCC)    status post ICD implantation    Past Surgical History:  Procedure Laterality Date   BIV UPGRADE N/A 03/22/2022   Procedure: BIV UPGRADE;  Surgeon: Fernande Elspeth BROCKS, MD;  Location: [facility] INVASIVE CV LAB;  Service: Cardiovascular;  Laterality: N/A;   BRONCHIAL BIOPSY  07/22/2020   Procedure: BRONCHIAL BIOPSIES;  Surgeon: Shelah Lamar RAMAN, MD;  Location: [facility] ENDOSCOPY;  Service: Cardiopulmonary;;   BRONCHIAL BRUSHINGS  07/22/2020   Procedure: BRONCHIAL BRUSHINGS;  Surgeon: Shelah Lamar RAMAN, MD;  Location: [facility] ENDOSCOPY;  Service: Cardiopulmonary;;   BRONCHIAL WASHINGS  07/22/2020   Procedure: BRONCHIAL WASHINGS;  Surgeon:  Shelah Lamar RAMAN, MD;  Location: [facility];  Service: Cardiopulmonary;;   CARDIAC CATHETERIZATION  04/03/2007   EF 40%   CARDIAC CATHETERIZATION  02/06/2006   EF 45%. ANTERIOR HYPOKINESIS   CARDIAC CATHETERIZATION  05/29/2000   EF 50%. MILD ANTERIOR HYPOKINESIS   CARDIAC CATHETERIZATION  10/25/1999   EF 50%. SEVERE MITRAL REGURGITATION   CARDIAC CATHETERIZATION  01/06/2003   EF 50%   CARDIAC DEFIBRILLATOR PLACEMENT     CARDIOVERSION N/A 02/07/2019   Procedure: CARDIOVERSION;  Surgeon: Okey Vina GAILS, MD;  Location: [facility] ENDOSCOPY;  Service: Cardiovascular;  Laterality: N/A;   CARDIOVERSION N/A 11/24/2019   Procedure: CARDIOVERSION;  Surgeon: Delford Maude BROCKS, MD;  Location: [facility] ENDOSCOPY;  Service: Cardiovascular;   Laterality: N/A;   CARDIOVERSION N/A 02/15/2022   Procedure: CARDIOVERSION;  Surgeon: Jeffrie Oneil BROCKS, MD;  Location: [facility] ENDOSCOPY;  Service: Cardiovascular;  Laterality: N/A;   CARDIOVERSION N/A 03/03/2022   Procedure: CARDIOVERSION;  Surgeon: Loni Soyla LABOR, MD;  Location: [facility] ENDOSCOPY;  Service: Cardiovascular;  Laterality: N/A;   CATARACT EXTRACTION W/ INTRAOCULAR LENS  IMPLANT, BILATERAL     CHOLECYSTECTOMY N/A 01/08/2019   Procedure: LAPAROSCOPIC CHOLECYSTECTOMY WITH INTRAOPERATIVE CHOLANGIOGRAM;  Surgeon: Belinda Cough, MD;  Location: [facility] OR;  Service: General;  Laterality: N/A;   CORONARY ARTERY BYPASS GRAFT  2001   2 VESSEL CABG AND MITRAL VALVE REPAIR. HE HAD A LIMA GRAFT TO THE LAD AND RADIAL ARTERY  GRAFT TO THE OBTUSE MARGINAL  OF LEFT CIRCUMFLEX   CORONARY PRESSURE/FFR STUDY N/A 01/05/2017   Procedure: INTRAVASCULAR PRESSURE WIRE/FFR STUDY;  Surgeon: Wonda Sharper, MD;  Location: [facility] INVASIVE CV LAB;  Service: Cardiovascular;  Laterality: N/A;   ENDARTERECTOMY Right 11/07/2018   Procedure: ENDARTERECTOMY CAROTID RIGHT;  Surgeon: Sheree Penne Bruckner, MD;  Location: [facility] OR;  Service: Vascular;  Laterality: Right;   EP IMPLANTABLE DEVICE N/A 11/06/2014   Procedure: ICD Generator Changeout;  Surgeon: Elspeth BROCKS Sage, MD;  Location: [facility] INVASIVE CV LAB;  Service: Cardiovascular;  Laterality: N/A;   ERCP N/A 01/10/2019   Procedure: ENDOSCOPIC RETROGRADE CHOLANGIOPANCREATOGRAPHY (ERCP);  Surgeon: Rosalie Kitchens, MD;  Location: [facility] ENDOSCOPY;  Service: Endoscopy;  Laterality: N/A;   FOREIGN BODY REMOVAL Right 06/21/2017   Procedure: FOREIGN BODY REMOVAL ADULT RIGHT RING FINGER;  Surgeon: Murrell Drivers, MD;  Location: [facility] OR;  Service: Orthopedics;  Laterality: Right;   HEMORRHOID SURGERY     HEMORROIDECTOMY     KNEE ARTHROSCOPY     RIGHT KNEE   LAPAROSCOPIC APPENDECTOMY N/A 01/08/2019   Procedure: APPENDECTOMY LAPAROSCOPIC;  Surgeon: Belinda Cough, MD;  Location: [facility] OR;  Service: General;   Laterality: N/A;   LEFT HEART CATHETERIZATION WITH CORONARY ANGIOGRAM N/A 07/04/2012   Procedure: LEFT HEART CATHETERIZATION WITH CORONARY ANGIOGRAM;  Surgeon: Sharper Wonda, MD;  Location: [facility] CATH LAB;  Service: Cardiovascular;  Laterality: N/A;   MITRAL VALVE REPLACEMENT     No LAA occlusion at the time of surgery  CHRISTELLA Lawyer report 2018   REMOVAL OF STONES  01/10/2019   Procedure: REMOVAL OF STONES;  Surgeon: Rosalie Kitchens, MD;  Location: [facility] ENDOSCOPY;  Service: Endoscopy;;   RIGHT/LEFT HEART CATH AND CORONARY/GRAFT ANGIOGRAPHY N/A 01/05/2017   Procedure: RIGHT/LEFT HEART CATH AND CORONARY/GRAFT ANGIOGRAPHY;  Surgeon: Wonda Sharper, MD;  Location: [facility] INVASIVE CV LAB;  Service: Cardiovascular;  Laterality: N/A;   SPHINCTEROTOMY  01/10/2019   Procedure: SPHINCTEROTOMY;  Surgeon: Rosalie Kitchens, MD;  Location: [facility] ENDOSCOPY;  Service: Endoscopy;;   TRANSTHORACIC ECHOCARDIOGRAM  04/02/2007   EF 35%  VIDEO BRONCHOSCOPY Right 07/22/2020   Procedure: VIDEO BRONCHOSCOPY WITH FLUORO, BAL AND BIOPSY;  Surgeon: Shelah Lamar RAMAN, MD;  Location: [facility] ENDOSCOPY;  Service: Cardiopulmonary;  Laterality: Right;  CONSCIOUS SEDATION OR MAC    Social History:  reports that he quit smoking about 52 years ago. His smoking use included cigarettes. He started smoking about 67 years ago. He has a 32.1 pack-year smoking history. He has never used smokeless tobacco. He reports current alcohol  use. He reports that he does not use drugs.  Allergies:  Allergies  Allergen Reactions   Bee Venom Anaphylaxis   Latex Other (See Comments) and Rash    REDNESS AT REACTION SITE.   Metformin Diarrhea   Rivaroxaban  Other (See Comments)    Headaches, blurred vision  Other Reaction(s): Dizziness   Sotalol Other (See Comments)    BLUE TOES- cut his circulation off   Levofloxacin     long QT syndrome, so avoid   Warfarin Sodium Other (See Comments)    migraine headaches/vision impariment    Medications Prior to Admission   Medication Sig Dispense Refill   atorvastatin  (LIPITOR ) 80 MG tablet TAKE ONE TABLET BY MOUTH EVERY DAY FOR CHOLESTEROL 90 tablet 3   digoxin  (LANOXIN ) 0.125 MG tablet Take 1 tablet (0.125 mg total) by mouth daily. 90 tablet 2   ELIQUIS  5 MG TABS tablet TAKE ONE TABLET BY MOUTH TWICE DAILY 180 tablet 1   empagliflozin  (JARDIANCE ) 25 MG TABS tablet Take 1 tablet (25 mg total) by mouth daily before breakfast. 90 tablet 1   Erenumab -aooe (AIMOVIG ) 140 MG/ML SOAJ Inject 140 mg into the skin every 28 (twenty-eight) days. 1.12 mL 11   ibuprofen  (ADVIL ) 200 MG tablet Take 400 mg by mouth every 8 (eight) hours as needed (headaches/migraine).     Magnesium  250 MG TABS Take 250 mg by mouth in the morning.     metoprolol  succinate (TOPROL -XL) 25 MG 24 hr tablet TAKE ONE TABLET BY MOUTH TWICE DAILY WITH OR immediately following A MEAL 180 tablet 0   nateglinide  (STARLIX ) 120 MG tablet Take 1 tablet (120 mg total) by mouth 3 (three) times daily with meals. 90 tablet 5   potassium chloride  (KLOR-CON ) 10 MEQ tablet Take 1 tablet (10 mEq total) by mouth daily. 90 tablet 3   torsemide  (DEMADEX ) 20 MG tablet Take 1 tablet (20 mg total) by mouth daily. 90 tablet 3   Continuous Glucose Sensor (DEXCOM G7 SENSOR) MISC 1 Device by Does not apply route continuous. 1 sensor every 10 days as prescribed 9 each 3   EPIPEN 2-PAK 0.3 MG/0.3ML SOAJ injection Inject 0.3 mg into the muscle daily as needed for anaphylaxis.        Physical Exam: Blood pressure (!) 122/90, pulse 86, temperature 98 F (36.7 C), temperature source Oral, resp. rate (!) 22, SpO2 99%. General: pleasant, WD/WN male who is laying in bed in NAD HEENT: head is normocephalic, atraumatic.  Sclera are non-icteric.  Heart: regular, rate, and rhythm.   Lungs: Respiratory effort nonlabored Abd:  Soft, ND, NT. RIH soft, reducible and without any overlying skin changes.  MS: no BUE edema Skin: warm and dry  Psych: A&Ox4 with an appropriate affect Neuro:  normal speech, thought process intact, gait not assessed   Results for orders placed or performed during the hospital encounter of 11/27/23 (from the past 48 hours)  Comprehensive metabolic panel     Status: Abnormal   Collection Time: 11/27/23  4:15 PM  Result Value Ref Range  Sodium 132 (L) 135 - 145 mmol/L   Potassium 4.1 3.5 - 5.1 mmol/L   Chloride 89 (L) 98 - 111 mmol/L   CO2 28 22 - 32 mmol/L   Glucose, Bld 355 (H) 70 - 99 mg/dL    Comment: Glucose reference range applies only to samples taken after fasting for at least 8 hours.   BUN 28 (H) 8 - 23 mg/dL   Creatinine, Ser 8.93 0.61 - 1.24 mg/dL   Calcium  10.0 8.9 - 10.3 mg/dL   Total Protein 8.3 (H) 6.5 - 8.1 g/dL   Albumin 4.4 3.5 - 5.0 g/dL   AST 33 15 - 41 U/L   ALT 31 0 - 44 U/L   Alkaline Phosphatase 174 (H) 38 - 126 U/L   Total Bilirubin 2.0 (H) 0.0 - 1.2 mg/dL   GFR, Estimated >39 >39 mL/min    Comment: (NOTE) Calculated using the CKD-EPI Creatinine Equation (2021)    Anion gap 16 (H) 5 - 15    Comment: Performed at [facility], [address], [place], [place] [address]  CBC     Status: Abnormal   Collection Time: 11/27/23  4:15 PM  Result Value Ref Range   WBC 7.7 4.0 - 10.5 K/uL   RBC 4.63 4.22 - 5.81 MIL/uL   Hemoglobin 15.9 13.0 - 17.0 g/dL   HCT 54.7 60.9 - 47.9 %   MCV 97.6 80.0 - 100.0 fL   MCH 34.3 (H) 26.0 - 34.0 pg   MCHC 35.2 30.0 - 36.0 g/dL   RDW 87.2 88.4 - 84.4 %   Platelets 132 (L) 150 - 400 K/uL   nRBC 0.0 0.0 - 0.2 %    Comment: Performed at [facility], [address], [place], [place] [address]  Lipase, blood     Status: None   Collection Time: 11/27/23  4:15 PM  Result Value Ref Range   Lipase 24 11 - 51 U/L    Comment: Performed at [facility], [address], [place], [place] [address]  Lactic acid, plasma     Status: Abnormal   Collection Time: 11/27/23  4:34 PM  Result Value Ref Range   Lactic Acid, Venous 2.3 (HH)  0.5 - 1.9 mmol/L    Comment: Critical Value, Read Back and verified with Cortney Mas RN,11/27/2023 @ 1745 by Adelbert Performed at [facility], [address], [place], [place] [address]   Lactic acid, plasma     Status: None   Collection Time: 11/27/23  6:34 PM  Result Value Ref Range   Lactic Acid, Venous 1.0 0.5 - 1.9 mmol/L    Comment: Performed at [facility], [address], [place], [place] [address]   CT Angio Chest/Abd/Pel for Dissection W and/or Wo Contrast Result Date: 11/27/2023 CLINICAL DATA:  Provided history: Abd pain, hx of AAA and hernia Hospice patient. EXAM: CT ANGIOGRAPHY CHEST, ABDOMEN AND PELVIS TECHNIQUE: Non-contrast CT of the chest was initially obtained. Multidetector CT imaging through the chest, abdomen and pelvis was performed using the standard protocol during bolus administration of intravenous contrast. Multiplanar reconstructed images and MIPs were obtained and reviewed to evaluate the vascular anatomy. RADIATION DOSE REDUCTION: This exam was performed according to the departmental dose-optimization program which includes automated exposure control, adjustment of the mA and/or kV according to patient size and/or use of iterative reconstruction technique. CONTRAST:  OMNIPAQUE  IOHEXOL  350 MG/ML SOLN COMPARISON:  Abdominopelvic CTA 08/11/2022 FINDINGS: CTA CHEST FINDINGS Cardiovascular: No aortic hematoma  on noncontrast exam. Thoracic aortic atherosclerosis and tortuosity. No dissection or acute aortic findings. Dilated central pulmonary arteries. The heart is enlarged. Post CABG. There are native coronary artery calcifications. Left-sided pacemaker in place. No pericardial effusion. Contrast refluxes into the right internal jugular vein. Mediastinum/Nodes: No suspicious lymphadenopathy. Unremarkable appearance of the esophagus. Lungs/Pleura: Mild emphysema. No confluent opacity, pleural effusion, pulmonary edema or pneumothorax.  No pulmonary mass. Musculoskeletal: Median sternotomy. Scoliosis and degenerative change in the spine. There are no acute or suspicious osseous abnormalities. Review of the MIP images confirms the above findings. CTA ABDOMEN AND PELVIS FINDINGS VASCULAR Aorta: Infrarenal abdominal aortic aneurysm has increased from prior exam currently 7 x 6.6 cm, previously 6.8 x 6.6 cm. Proximal extent proximally 4.5 cm below the renal arteries. Distal extent extends to the iliac bifurcation. Extensive mural thrombus is again seen. High-density material in the periphery of the mural thrombus is unchanged from prior. There is no periaortic stranding or inflammation. Aortic atherosclerosis. Celiac: Patent without evidence of aneurysm, dissection, vasculitis or significant stenosis. Moderate splenic artery atherosclerosis. SMA: Patent without evidence of aneurysm, dissection, vasculitis or significant stenosis. Renals: Both renal arteries are patent without evidence of aneurysm, dissection, vasculitis, fibromuscular dysplasia or significant stenosis. IMA: Grossly patent. Inflow: Patent without evidence of aneurysm, dissection, vasculitis or significant stenosis. Extensive calcified plaque and tortuosity. Veins: No obvious venous abnormality within the limitations of this arterial phase study. No portal venous or mesenteric gas. Review of the MIP images confirms the above findings. NON-VASCULAR Hepatobiliary: Cholecystectomy. Unremarkable arterial phase evaluation of the liver. Pancreas: No ductal dilatation or inflammation. Spleen: Unremarkable arterial phase evaluation. Adrenals/Urinary Tract: No adrenal nodule. No hydronephrosis or renal inflammation. Left renal cyst. No further follow-up imaging is recommended. Prominent bladder distension without wall thickening. Stomach/Bowel: Right inguinal hernia contains a loop of small bowel. There is fecalization of small bowel contents within the hernia and adjacent mesenteric edema. Small  amount of fluid in the hernia sac. The small bowel proximal to this is dilated and fluid-filled. The stomach is distended with air-fluid level. No bowel pneumatosis. Colonic diverticulosis without focal diverticulitis. Lymphatic: No bulky lymphadenopathy. Reproductive: Prostate is unremarkable. Other: Trace free fluid in the pericolic gutters and pelvis. No free air. In addition to the small bowel containing right inguinal hernia there is a small fat containing left inguinal hernia. Musculoskeletal: Remote injury to the right iliac wing. Scoliosis and degenerative change in the spine. Chronic compression fracture of L4 and L5, without progression. Review of the MIP images confirms the above findings. IMPRESSION: 1. Small-bowel obstruction secondary to right inguinal hernia. The hernia contains a loop of small bowel with fecalization of small bowel contents and adjacent mesenteric edema. Small amount of fluid in the hernia sac. 2. Infrarenal abdominal aortic aneurysm has increased in size from prior exam currently 7 x 6.6 cm, previously 6.8 x 6.6 cm. No periaortic stranding or inflammation. Consensus guidelines recommend referral to a vascular specialist, giving consideration for patient's clinical status. 3. Moderate bladder distension. 4. No aortic dissection or acute aortic findings. 5. Dilated central pulmonary arteries, suggesting pulmonary arterial hypertension. 6. Colonic diverticulosis without focal diverticulitis. Aortic Atherosclerosis (ICD10-I70.0) and Emphysema (ICD10-J43.9). Electronically Signed   By: Andrea Gasman M.D.   On: 11/27/2023 19:46    Anti-infectives (From admission, onward)    None       Assessment/Plan RIH - No indication for emergency surgery. The RIH is reducible, without overlying skin changes, his abdomen is soft, he is tolerating po without n/v  and having bowel function. Recommend truss belt which he has at home. Patient is on hospice and can follow up prn. We will sign  off. Please call back with questions or concerns.   I reviewed nursing notes, ED provider notes, last 24 h vitals and pain scores, last 48 h intake and output, last 24 h labs and trends, and last 24 h imaging results.   [name], [facility] Surgery 11/28/2023, 12:37 PM Please see Amion for pager number during day hours 7:00am-4:30pm

## 2023-11-28 NOTE — Progress Notes (Signed)
 Reviewed AVS, patient expressed understanding of medications, MD follow up reviewed.   Removed IV, Site clean, dry and intact.  See LDA for information on wounds at discharge. Patient states all belongings brought to the hospital at time of admission are accounted for and packed to take home.  Patient informed and expressed understanding where to pick up discharge medications.  Nursing staff contacted to transport patient to ED Dept entrance where family member was waiting in vehicle to transport home.

## 2023-11-28 NOTE — Inpatient Diabetes Management (Signed)
 Inpatient Diabetes Program Recommendations  AACE/ADA: New Consensus Statement on Inpatient Glycemic Control (2015)  Target Ranges:  Prepandial:   less than 140 mg/dL      Peak postprandial:   less than 180 mg/dL (1-2 hours)      Critically ill patients:  140 - 180 mg/dL   Lab Results  Component Value Date   GLUCAP 145 (H) 03/03/2022   HGBA1C 8.1 (A) 07/30/2023    Review of Glycemic Control  Latest Reference Range & Units 11/27/23 16:15  Glucose 70 - 99 mg/dL 644 (H)  (H): Data is abnormally high Diabetes history: Type 2 DM Outpatient Diabetes medications: Jardiance  25 mg every day, Starlix  120 mg TID Current orders for Inpatient glycemic control: none  Inpatient Diabetes Program Recommendations:    If aligns with plan of care consider adding: -Lantus  10 units every day - Novolog  0-6 units Q4H  Thanks, Tinnie Minus, MSN, RNC-OB Diabetes Coordinator [phone] (8a-5p)

## 2023-11-28 NOTE — ED Notes (Signed)
 [facility] is otw to transport the patient to [place] 4E rm#4

## 2023-11-28 NOTE — ED Notes (Signed)
 Called [facility] to check on transport

## 2023-11-28 NOTE — H&P (Addendum)
 History and Physical    Patient: [name] FMW:[id number] DOB: Jun 03, 1931 DOA: 11/27/2023 DOS: the patient was seen and examined on 11/28/2023 PCP: System, Provider Not In  Patient coming from: Home  Chief Complaint:  Chief Complaint  Patient presents with   Abdominal Pain   HPI: [name] is a 89 y.o. male with medical history significant of heart failure with reduced EF, polymorphic ventricular tachycardia s/p ICD, persistent atrial fibrillation, DM type II, COPD, abdominal aneurysm, presents with abdominal pain. He is accompanied by his daughter.  Yesterday, he experienced severe abdominal pain rated as 7 to 8 out of 10. No vomiting occurred during this episode, and he had two bowel movements the day before the pain started. He has been able to pass gas since the pain resolved.  Prior to this event, he occasionally experienced abdominal pain, particularly after getting up in the morning, which would resolve with a couple of Advils within 30 minutes to an hour.  He is on hospice care at home for heart failure and in the aneurysm for which he uses oxygen  occasionally for shortness of breath. He has a truss to manage the hernia.  His daughter is concerned about the hernia recurring, especially since it protruded again after he got up to use the restroom last night.  Patient was seen at [facility] ER patient was noted to be afebrile with pulse 54-89, respirations 14-24, and all other vital signs maintained.  Labs obtained yesterday significant for platelets 132, lactic acid 2.3-> 1, sodium 132, chloride 89, glucose 355, BUN 28, creatinine 1.06, anion gap 16, alkaline phosphatase 174, and total bilirubin 2.  CT revealed a small bowel obstruction secondary to patient was given a bolus of 250 mL of IV fluids.  General surgery had been consulted and recommended placement of NG tube.  However hernia was able to be manually reduced in the ED with improvement in patient's  pain.  Review of Systems: As mentioned in the history of present illness. All other systems reviewed and are negative. Past Medical History:  Diagnosis Date   Acute on chronic combined systolic (congestive) and diastolic (congestive) heart failure (HCC)    AICD (automatic cardioverter/defibrillator) present    [employer]n PPM/ICD   Carotid stenosis, right    s/p endarterectomy 11/07/18   Chronic combined systolic (congestive) and diastolic (congestive) heart failure (HCC)    Coronary artery disease    [facility]   Diabetes mellitus    Dysrhythmia    Endocarditis, valve unspecified    Mitral   Headache    hx migraines 6-7 x mo   History of migraine headaches    History of seasonal allergies    ICD (implantable cardiac defibrillator) in place    Optic nerve hemorrhage, 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  We will monitor this closely and look for changes over the ensuing 6 months.  Repeat visit here in 6 months   PAF (paroxysmal atrial fibrillation) (HCC)    NO LAA occlusion at the time of surgery  [name] 2018   Pleural effusion    PVC's (premature ventricular contractions)    Ventricular tachycardia, polymorphic (HCC)    status post ICD implantation   Past Surgical History:  Procedure Laterality Date   BIV UPGRADE N/A 03/22/2022   Procedure: BIV UPGRADE;  Surgeon: Fernande Elspeth BROCKS, MD;  Location: [facility] INVASIVE CV LAB;  Service: Cardiovascular;  Laterality: N/A;   BRONCHIAL BIOPSY  07/22/2020   Procedure: BRONCHIAL BIOPSIES;  Surgeon: Shelah Lamar RAMAN, MD;  Location: [facility] ENDOSCOPY;  Service: Cardiopulmonary;;   BRONCHIAL BRUSHINGS  07/22/2020   Procedure: BRONCHIAL BRUSHINGS;  Surgeon: Shelah Lamar RAMAN, MD;  Location: [facility] ENDOSCOPY;  Service: Cardiopulmonary;;   BRONCHIAL WASHINGS  07/22/2020   Procedure: BRONCHIAL WASHINGS;  Surgeon: Shelah Lamar RAMAN, MD;  Location: [facility] ENDOSCOPY;   Service: Cardiopulmonary;;   CARDIAC CATHETERIZATION  04/03/2007   EF 40%   CARDIAC CATHETERIZATION  02/06/2006   EF 45%. ANTERIOR HYPOKINESIS   CARDIAC CATHETERIZATION  05/29/2000   EF 50%. MILD ANTERIOR HYPOKINESIS   CARDIAC CATHETERIZATION  10/25/1999   EF 50%. SEVERE MITRAL REGURGITATION   CARDIAC CATHETERIZATION  01/06/2003   EF 50%   CARDIAC DEFIBRILLATOR PLACEMENT     CARDIOVERSION N/A 02/07/2019   Procedure: CARDIOVERSION;  Surgeon: Okey Vina GAILS, MD;  Location: [facility] ENDOSCOPY;  Service: Cardiovascular;  Laterality: N/A;   CARDIOVERSION N/A 11/24/2019   Procedure: CARDIOVERSION;  Surgeon: Delford Maude BROCKS, MD;  Location: [facility] ENDOSCOPY;  Service: Cardiovascular;  Laterality: N/A;   CARDIOVERSION N/A 02/15/2022   Procedure: CARDIOVERSION;  Surgeon: Jeffrie Oneil BROCKS, MD;  Location: [facility] ENDOSCOPY;  Service: Cardiovascular;  Laterality: N/A;   CARDIOVERSION N/A 03/03/2022   Procedure: CARDIOVERSION;  Surgeon: Loni Soyla LABOR, MD;  Location: [facility] ENDOSCOPY;  Service: Cardiovascular;  Laterality: N/A;   CATARACT EXTRACTION W/ INTRAOCULAR LENS  IMPLANT, BILATERAL     CHOLECYSTECTOMY N/A 01/08/2019   Procedure: LAPAROSCOPIC CHOLECYSTECTOMY WITH INTRAOPERATIVE CHOLANGIOGRAM;  Surgeon: Belinda Cough, MD;  Location: [facility] OR;  Service: General;  Laterality: N/A;   CORONARY ARTERY BYPASS GRAFT  2001   2 VESSEL CABG AND MITRAL VALVE REPAIR. HE HAD A LIMA GRAFT TO THE LAD AND RADIAL ARTERY  GRAFT TO THE OBTUSE MARGINAL  OF LEFT CIRCUMFLEX   CORONARY PRESSURE/FFR STUDY N/A 01/05/2017   Procedure: INTRAVASCULAR PRESSURE WIRE/FFR STUDY;  Surgeon: Wonda Sharper, MD;  Location: [facility] INVASIVE CV LAB;  Service: Cardiovascular;  Laterality: N/A;   ENDARTERECTOMY Right 11/07/2018   Procedure: ENDARTERECTOMY CAROTID RIGHT;  Surgeon: Sheree Penne Bruckner, MD;  Location: [facility] OR;  Service: Vascular;  Laterality: Right;   EP IMPLANTABLE DEVICE N/A 11/06/2014   Procedure: ICD Generator Changeout;  Surgeon: Elspeth BROCKS Sage, MD;   Location: [facility] INVASIVE CV LAB;  Service: Cardiovascular;  Laterality: N/A;   ERCP N/A 01/10/2019   Procedure: ENDOSCOPIC RETROGRADE CHOLANGIOPANCREATOGRAPHY (ERCP);  Surgeon: Rosalie Kitchens, MD;  Location: [facility] ENDOSCOPY;  Service: Endoscopy;  Laterality: N/A;   FOREIGN BODY REMOVAL Right 06/21/2017   Procedure: FOREIGN BODY REMOVAL ADULT RIGHT RING FINGER;  Surgeon: Murrell Drivers, MD;  Location: [facility] OR;  Service: Orthopedics;  Laterality: Right;   HEMORRHOID SURGERY     HEMORROIDECTOMY     KNEE ARTHROSCOPY     RIGHT KNEE   LAPAROSCOPIC APPENDECTOMY N/A 01/08/2019   Procedure: APPENDECTOMY LAPAROSCOPIC;  Surgeon: Belinda Cough, MD;  Location: [facility] OR;  Service: General;  Laterality: N/A;   LEFT HEART CATHETERIZATION WITH CORONARY ANGIOGRAM N/A 07/04/2012   Procedure: LEFT HEART CATHETERIZATION WITH CORONARY ANGIOGRAM;  Surgeon: Sharper Wonda, MD;  Location: [facility] CATH LAB;  Service: Cardiovascular;  Laterality: N/A;   MITRAL VALVE REPLACEMENT     No LAA occlusion at the time of surgery  CHRISTELLA Lawyer report 2018   REMOVAL OF STONES  01/10/2019   Procedure: REMOVAL OF STONES;  Surgeon: Rosalie Kitchens, MD;  Location: [facility] ENDOSCOPY;  Service: Endoscopy;;  RIGHT/LEFT HEART CATH AND CORONARY/GRAFT ANGIOGRAPHY N/A 01/05/2017   Procedure: RIGHT/LEFT HEART CATH AND CORONARY/GRAFT ANGIOGRAPHY;  Surgeon: Wonda Sharper, MD;  Location: [facility] INVASIVE CV LAB;  Service: Cardiovascular;  Laterality: N/A;   SPHINCTEROTOMY  01/10/2019   Procedure: SPHINCTEROTOMY;  Surgeon: Rosalie Kitchens, MD;  Location: [facility] ENDOSCOPY;  Service: Endoscopy;;   TRANSTHORACIC ECHOCARDIOGRAM  04/02/2007   EF 35%   VIDEO BRONCHOSCOPY Right 07/22/2020   Procedure: VIDEO BRONCHOSCOPY WITH FLUORO, BAL AND BIOPSY;  Surgeon: Shelah Lamar RAMAN, MD;  Location: [facility] ENDOSCOPY;  Service: Cardiopulmonary;  Laterality: Right;  CONSCIOUS SEDATION OR MAC   Social History:  reports that he quit smoking about 52 years ago. His smoking use included cigarettes. He started smoking  about 67 years ago. He has a 32.1 pack-year smoking history. He has never used smokeless tobacco. He reports current alcohol  use. He reports that he does not use drugs.  Allergies  Allergen Reactions   Bee Venom Anaphylaxis   Latex Other (See Comments) and Rash    REDNESS AT REACTION SITE.   Metformin Diarrhea   Rivaroxaban  Other (See Comments)    Headaches, blurred vision  Other Reaction(s): Dizziness   Sotalol Other (See Comments)    BLUE TOES- cut his circulation off   Levofloxacin     long QT syndrome, so avoid   Warfarin Sodium Other (See Comments)    migraine headaches/vision impariment    Family History  Problem Relation Age of Onset   Heart disease Father    Emphysema Father    Stroke Mother    Cancer Sister        breast cancer   Heart disease Paternal Grandmother    Heart disease Paternal Grandfather    Diabetes Other        grandmother, aunt, uncle   Cancer Other        lung cancer, aunt    Prior to Admission medications   Medication Sig Start Date End Date Taking? Authorizing Provider  atorvastatin  (LIPITOR ) 80 MG tablet TAKE ONE TABLET BY MOUTH EVERY DAY FOR CHOLESTEROL 06/01/21   Fernande Elspeth BROCKS, MD  Continuous Glucose Sensor (DEXCOM G7 SENSOR) MISC 1 Device by Does not apply route continuous. 1 sensor every 10 days as prescribed 12/27/22   Dartha Ernst, MD  digoxin  (LANOXIN ) 0.125 MG tablet Take 1 tablet (0.125 mg total) by mouth daily. 08/13/23   Wonda Sharper, MD  ELIQUIS  5 MG TABS tablet TAKE ONE TABLET BY MOUTH TWICE DAILY 03/18/21   Cooper, Michael, MD  empagliflozin  (JARDIANCE ) 25 MG TABS tablet Take 1 tablet (25 mg total) by mouth daily before breakfast. 04/23/23   Motwani, Ernst, MD  EPIPEN 2-PAK 0.3 MG/0.3ML SOAJ injection Inject 0.3 mg into the muscle daily as needed for anaphylaxis.  08/12/12   [provider]  Erenumab -aooe (AIMOVIG ) 140 MG/ML SOAJ Inject 140 mg into the skin every 28 (twenty-eight) days. 12/04/22   Skeet Juliene SAUNDERS, DO   ibuprofen  (ADVIL ) 200 MG tablet Take 400 mg by mouth every 8 (eight) hours as needed (headaches/migraine).    [provider]  Magnesium  250 MG TABS Take 250 mg by mouth in the morning.    [provider]  metoprolol  succinate (TOPROL -XL) 25 MG 24 hr tablet TAKE ONE TABLET BY MOUTH TWICE DAILY WITH OR immediately following A MEAL 09/12/23   Wonda Sharper, MD  nateglinide  (STARLIX ) 120 MG tablet Take 1 tablet (120 mg total) by mouth 3 (three) times daily with meals. 09/10/23 11/13/23  Motwani, Komal,  MD  potassium chloride  (KLOR-CON ) 10 MEQ tablet Take 1 tablet (10 mEq total) by mouth daily. 12/01/22   Wonda Sharper, MD  torsemide  (DEMADEX ) 20 MG tablet Take 1 tablet (20 mg total) by mouth daily. 12/01/22   Wonda Sharper, MD    Physical Exam: Vitals:   11/28/23 0645 11/28/23 0700 11/28/23 0800 11/28/23 0900  BP: 134/68 116/64 (!) 129/91 123/89  Pulse: 62 (!) 58 (!) 58 74  Resp: 19 16 16 17   Temp:      TempSrc:      SpO2: 98% 98% 96% 99%    Constitutional: Elderly male currently in no acute distress Eyes: PERRL, lids and conjunctivae normal ENMT: Mucous membranes are moist.  Fair dentition.  Decreased hearing. Neck: normal, supple, no masses, no JVD Respiratory: Decreased aeration currently on 2 L nasal cannula oxygen  with O2 saturations maintained.  Patient able to speak in complete sentences. Cardiovascular: Regular rate and rhythm.  No extremity edema.  Abdomen: no tenderness, no masses palpated.  Bowel sounds positive.  Musculoskeletal: no clubbing / cyanosis. No joint deformity upper and lower extremities. Good ROM, no contractures. Normal muscle tone.  Skin: no rashes, lesions, ulcers. No induration Neurologic: CN 2-12 grossly intact. Strength 5/5 in all 4.  Psychiatric: Normal judgment and insight. Alert and oriented x 3. Normal mood.   Data Reviewed:  EKG revealed dual paced rhythm at 93 bpm.  Reviewed labs, imaging, and pertinent records as  documented.  Assessment and Plan:  Small bowel obstruction secondary to right inguinal hernia Resolved.  Patient presented with severe abdominal pain.  CT imaging concerning for small bowel obstruction secondary to a right inguinal hernia.  General surgery had been consulted.  Hernia ultimately had been able to be reduced by the ED provider.  Patient makes note that he is not a surgical candidate. - Admit to a progressive bed - Strict I&Os - Clear liquid diet advanced  - Recommend using truss - Fentanyl  IV as needed for pain - General Surgery consulted,  will follow-up for any further recommendation  Heart failure with reduced ejection fraction Chronic.  Patient appears to be euvolemic on physical exam at this time.  Last echocardiogram noted EF to be 20 to 25% with indeterminate diastolic parameters, and severe tricuspid regurgitation. - Daily weights - Continue current medication regimen including torsemide   History of ventricular tachycardia s/p ICD Persistent atrial fibrillation on chronic anticoagulation Patient currently paced. - Continue Eliquis , digoxin , and metoprolol    Uncontrolled Diabetes mellitus type 2 with hyperglycemia, long-term use Initially on glucose elevated up to 355.  Last available hemoglobin A1c noted to be 8.1 when checked on 07/30/2023. - Hypoglycemic protocols - Continue oral regimen - CBGs before every meal with sensitive doses   COPD overlap syndrome Patient without wheezing noted on physical exam, but decreased overall aeration.  Patient has oxygen  at home to use as needed. - Breathing treatments as needed  AAA Patient has a 7 x 6.6 cm infrarenal abdominal aortic aneurysm which increased in size from 6.8 x 6.6 cm.  Followed by vascular surgery, but not felt to be a surgical candidate.  On hospice - Plan to discharge back to hospice  DVT prophylaxis: Continue Eliquis  Advance Care Planning:   Code Status: Do not attempt resuscitation (DNR)  PRE-ARREST INTERVENTIONS DESIRED    Consults: General Surgery  Family Communication: Family updated at bedside  Severity of Illness: The appropriate patient status for this patient is OBSERVATION. Observation status is judged to be reasonable and necessary  in order to provide the required intensity of service to ensure the patient's safety. The patient's presenting symptoms, physical exam findings, and initial radiographic and laboratory data in the context of their medical condition is felt to place them at decreased risk for further clinical deterioration. Furthermore, it is anticipated that the patient will be medically stable for discharge from the hospital within 2 midnights of admission.   Author: Maximino DELENA Sharps, MD 11/28/2023 10:39 AM  For on call review [email or web address].

## 2023-11-28 NOTE — Care Management CC44 (Signed)
 Condition Code 44 Documentation Completed  Patient Details  Name: TAAHIR GRISBY MRN: [id number] Date of Birth: 03-Dec-1931   Condition Code 44 given:  Yes Patient signature on Condition Code 44 notice:  Yes Documentation of 2 MD's agreement:  Yes Code 44 added to claim:  Yes    Nena LITTIE Coffee, RN 11/28/2023, 6:27 PM

## 2023-11-28 NOTE — Care Management Obs Status (Signed)
 MEDICARE OBSERVATION STATUS NOTIFICATION   Patient Details  Name: [name] MRN: [id number] Date of Birth: 11/21/1931   Medicare Observation Status Notification Given:  Yes    Nena LITTIE Coffee, RN 11/28/2023, 6:29 PM

## 2023-11-29 DIAGNOSIS — J449 Chronic obstructive pulmonary disease, unspecified: Secondary | ICD-10-CM | POA: Diagnosis not present

## 2023-11-29 DIAGNOSIS — H3530 Unspecified macular degeneration: Secondary | ICD-10-CM | POA: Diagnosis not present

## 2023-11-29 DIAGNOSIS — I509 Heart failure, unspecified: Secondary | ICD-10-CM | POA: Diagnosis not present

## 2023-11-29 DIAGNOSIS — Z950 Presence of cardiac pacemaker: Secondary | ICD-10-CM | POA: Diagnosis not present

## 2023-11-29 DIAGNOSIS — I519 Heart disease, unspecified: Secondary | ICD-10-CM | POA: Diagnosis not present

## 2023-11-29 DIAGNOSIS — E785 Hyperlipidemia, unspecified: Secondary | ICD-10-CM | POA: Diagnosis not present

## 2023-12-01 DIAGNOSIS — E785 Hyperlipidemia, unspecified: Secondary | ICD-10-CM | POA: Diagnosis not present

## 2023-12-01 DIAGNOSIS — I509 Heart failure, unspecified: Secondary | ICD-10-CM | POA: Diagnosis not present

## 2023-12-01 DIAGNOSIS — J449 Chronic obstructive pulmonary disease, unspecified: Secondary | ICD-10-CM | POA: Diagnosis not present

## 2023-12-01 DIAGNOSIS — Z8673 Personal history of transient ischemic attack (TIA), and cerebral infarction without residual deficits: Secondary | ICD-10-CM | POA: Diagnosis not present

## 2023-12-01 DIAGNOSIS — Z7901 Long term (current) use of anticoagulants: Secondary | ICD-10-CM | POA: Diagnosis not present

## 2023-12-01 DIAGNOSIS — I480 Paroxysmal atrial fibrillation: Secondary | ICD-10-CM | POA: Diagnosis not present

## 2023-12-01 DIAGNOSIS — I10 Essential (primary) hypertension: Secondary | ICD-10-CM | POA: Diagnosis not present

## 2023-12-01 DIAGNOSIS — E118 Type 2 diabetes mellitus with unspecified complications: Secondary | ICD-10-CM | POA: Diagnosis not present

## 2023-12-01 DIAGNOSIS — I714 Abdominal aortic aneurysm, without rupture, unspecified: Secondary | ICD-10-CM | POA: Diagnosis not present

## 2023-12-01 DIAGNOSIS — Z950 Presence of cardiac pacemaker: Secondary | ICD-10-CM | POA: Diagnosis not present

## 2023-12-01 DIAGNOSIS — I519 Heart disease, unspecified: Secondary | ICD-10-CM | POA: Diagnosis not present

## 2023-12-01 DIAGNOSIS — H3530 Unspecified macular degeneration: Secondary | ICD-10-CM | POA: Diagnosis not present

## 2023-12-01 DIAGNOSIS — N189 Chronic kidney disease, unspecified: Secondary | ICD-10-CM | POA: Diagnosis not present

## 2023-12-04 ENCOUNTER — Ambulatory Visit: Payer: Medicare Other | Admitting: Neurology

## 2023-12-04 DIAGNOSIS — E785 Hyperlipidemia, unspecified: Secondary | ICD-10-CM | POA: Diagnosis not present

## 2023-12-04 DIAGNOSIS — I519 Heart disease, unspecified: Secondary | ICD-10-CM | POA: Diagnosis not present

## 2023-12-04 DIAGNOSIS — Z950 Presence of cardiac pacemaker: Secondary | ICD-10-CM | POA: Diagnosis not present

## 2023-12-04 DIAGNOSIS — I509 Heart failure, unspecified: Secondary | ICD-10-CM | POA: Diagnosis not present

## 2023-12-04 DIAGNOSIS — J449 Chronic obstructive pulmonary disease, unspecified: Secondary | ICD-10-CM | POA: Diagnosis not present

## 2023-12-04 DIAGNOSIS — H3530 Unspecified macular degeneration: Secondary | ICD-10-CM | POA: Diagnosis not present

## 2023-12-11 DIAGNOSIS — I509 Heart failure, unspecified: Secondary | ICD-10-CM | POA: Diagnosis not present

## 2023-12-11 DIAGNOSIS — J449 Chronic obstructive pulmonary disease, unspecified: Secondary | ICD-10-CM | POA: Diagnosis not present

## 2023-12-11 DIAGNOSIS — I519 Heart disease, unspecified: Secondary | ICD-10-CM | POA: Diagnosis not present

## 2023-12-11 DIAGNOSIS — H3530 Unspecified macular degeneration: Secondary | ICD-10-CM | POA: Diagnosis not present

## 2023-12-11 DIAGNOSIS — E785 Hyperlipidemia, unspecified: Secondary | ICD-10-CM | POA: Diagnosis not present

## 2023-12-11 DIAGNOSIS — Z950 Presence of cardiac pacemaker: Secondary | ICD-10-CM | POA: Diagnosis not present

## 2023-12-13 DIAGNOSIS — Z950 Presence of cardiac pacemaker: Secondary | ICD-10-CM | POA: Diagnosis not present

## 2023-12-13 DIAGNOSIS — I519 Heart disease, unspecified: Secondary | ICD-10-CM | POA: Diagnosis not present

## 2023-12-13 DIAGNOSIS — I509 Heart failure, unspecified: Secondary | ICD-10-CM | POA: Diagnosis not present

## 2023-12-13 DIAGNOSIS — H3530 Unspecified macular degeneration: Secondary | ICD-10-CM | POA: Diagnosis not present

## 2023-12-13 DIAGNOSIS — E785 Hyperlipidemia, unspecified: Secondary | ICD-10-CM | POA: Diagnosis not present

## 2023-12-13 DIAGNOSIS — J449 Chronic obstructive pulmonary disease, unspecified: Secondary | ICD-10-CM | POA: Diagnosis not present

## 2023-12-18 ENCOUNTER — Ambulatory Visit: Admitting: "Endocrinology

## 2023-12-18 DIAGNOSIS — Z950 Presence of cardiac pacemaker: Secondary | ICD-10-CM | POA: Diagnosis not present

## 2023-12-18 DIAGNOSIS — I519 Heart disease, unspecified: Secondary | ICD-10-CM | POA: Diagnosis not present

## 2023-12-18 DIAGNOSIS — H3530 Unspecified macular degeneration: Secondary | ICD-10-CM | POA: Diagnosis not present

## 2023-12-18 DIAGNOSIS — J449 Chronic obstructive pulmonary disease, unspecified: Secondary | ICD-10-CM | POA: Diagnosis not present

## 2023-12-18 DIAGNOSIS — E785 Hyperlipidemia, unspecified: Secondary | ICD-10-CM | POA: Diagnosis not present

## 2023-12-18 DIAGNOSIS — I509 Heart failure, unspecified: Secondary | ICD-10-CM | POA: Diagnosis not present

## 2023-12-20 ENCOUNTER — Ambulatory Visit (INDEPENDENT_AMBULATORY_CARE_PROVIDER_SITE_OTHER): Payer: Medicare Other

## 2023-12-20 DIAGNOSIS — I4729 Other ventricular tachycardia: Secondary | ICD-10-CM

## 2023-12-21 LAB — CUP PACEART REMOTE DEVICE CHECK
Battery Remaining Longevity: 91 mo
Battery Voltage: 2.99 V
Brady Statistic RA Percent Paced: 0 %
Brady Statistic RV Percent Paced: 87.04 %
Date Time Interrogation Session: 20251120053518
Implantable Lead Connection Status: 753985
Implantable Lead Connection Status: 753985
Implantable Lead Connection Status: 753985
Implantable Lead Implant Date: 20040309
Implantable Lead Implant Date: 20040309
Implantable Lead Implant Date: 20240221
Implantable Lead Location: 753858
Implantable Lead Location: 753859
Implantable Lead Location: 753860
Implantable Lead Model: 158
Implantable Lead Model: 158
Implantable Lead Model: 5076
Implantable Lead Serial Number: 115061
Implantable Pulse Generator Implant Date: 20240221
Lead Channel Impedance Value: 1102 Ohm
Lead Channel Impedance Value: 361 Ohm
Lead Channel Impedance Value: 418 Ohm
Lead Channel Impedance Value: 456 Ohm
Lead Channel Impedance Value: 513 Ohm
Lead Channel Impedance Value: 551 Ohm
Lead Channel Impedance Value: 570 Ohm
Lead Channel Impedance Value: 589 Ohm
Lead Channel Impedance Value: 665 Ohm
Lead Channel Impedance Value: 703 Ohm
Lead Channel Impedance Value: 893 Ohm
Lead Channel Impedance Value: 912 Ohm
Lead Channel Impedance Value: 931 Ohm
Lead Channel Impedance Value: 988 Ohm
Lead Channel Pacing Threshold Amplitude: 0.625 V
Lead Channel Pacing Threshold Amplitude: 2.125 V
Lead Channel Pacing Threshold Pulse Width: 0.4 ms
Lead Channel Pacing Threshold Pulse Width: 1 ms
Lead Channel Sensing Intrinsic Amplitude: 1.375 mV
Lead Channel Sensing Intrinsic Amplitude: 1.375 mV
Lead Channel Sensing Intrinsic Amplitude: 6.75 mV
Lead Channel Sensing Intrinsic Amplitude: 6.75 mV
Lead Channel Setting Pacing Amplitude: 2.5 V
Lead Channel Setting Pacing Amplitude: 2.5 V
Lead Channel Setting Pacing Pulse Width: 0.4 ms
Lead Channel Setting Pacing Pulse Width: 1 ms
Lead Channel Setting Sensing Sensitivity: 0.9 mV
Zone Setting Status: 755011
Zone Setting Status: 755011

## 2023-12-24 NOTE — Progress Notes (Signed)
 Remote PPM Transmission

## 2023-12-25 DIAGNOSIS — Z950 Presence of cardiac pacemaker: Secondary | ICD-10-CM | POA: Diagnosis not present

## 2023-12-25 DIAGNOSIS — I519 Heart disease, unspecified: Secondary | ICD-10-CM | POA: Diagnosis not present

## 2023-12-25 DIAGNOSIS — E785 Hyperlipidemia, unspecified: Secondary | ICD-10-CM | POA: Diagnosis not present

## 2023-12-25 DIAGNOSIS — H3530 Unspecified macular degeneration: Secondary | ICD-10-CM | POA: Diagnosis not present

## 2023-12-25 DIAGNOSIS — J449 Chronic obstructive pulmonary disease, unspecified: Secondary | ICD-10-CM | POA: Diagnosis not present

## 2023-12-25 DIAGNOSIS — I509 Heart failure, unspecified: Secondary | ICD-10-CM | POA: Diagnosis not present

## 2023-12-28 DIAGNOSIS — J449 Chronic obstructive pulmonary disease, unspecified: Secondary | ICD-10-CM | POA: Diagnosis not present

## 2023-12-28 DIAGNOSIS — I519 Heart disease, unspecified: Secondary | ICD-10-CM | POA: Diagnosis not present

## 2023-12-28 DIAGNOSIS — H3530 Unspecified macular degeneration: Secondary | ICD-10-CM | POA: Diagnosis not present

## 2023-12-28 DIAGNOSIS — I509 Heart failure, unspecified: Secondary | ICD-10-CM | POA: Diagnosis not present

## 2023-12-28 DIAGNOSIS — Z950 Presence of cardiac pacemaker: Secondary | ICD-10-CM | POA: Diagnosis not present

## 2023-12-28 DIAGNOSIS — E785 Hyperlipidemia, unspecified: Secondary | ICD-10-CM | POA: Diagnosis not present

## 2023-12-31 ENCOUNTER — Ambulatory Visit: Payer: Self-pay | Admitting: Cardiovascular Disease

## 2024-01-18 ENCOUNTER — Ambulatory Visit: Admitting: "Endocrinology

## 2024-05-02 ENCOUNTER — Ambulatory Visit: Admitting: Emergency Medicine

## 2024-06-19 ENCOUNTER — Ambulatory Visit

## 2024-09-18 ENCOUNTER — Ambulatory Visit

## 2024-12-18 ENCOUNTER — Ambulatory Visit

## 2025-03-19 ENCOUNTER — Ambulatory Visit

## 2025-06-18 ENCOUNTER — Ambulatory Visit

## 2025-09-17 ENCOUNTER — Ambulatory Visit

## 2025-12-17 ENCOUNTER — Ambulatory Visit

## 2026-03-18 ENCOUNTER — Ambulatory Visit
